# Patient Record
Sex: Female | Born: 1965 | Race: White | Hispanic: No | Smoking: Former smoker
Health system: Southern US, Community
[De-identification: ages and names within clinical notes are randomized; demographics above are authoritative.]

## PROBLEM LIST (undated history)

## (undated) DIAGNOSIS — R519 Headache, unspecified: Secondary | ICD-10-CM

## (undated) DIAGNOSIS — F419 Anxiety disorder, unspecified: Secondary | ICD-10-CM

## (undated) DIAGNOSIS — R51 Headache: Secondary | ICD-10-CM

## (undated) DIAGNOSIS — E119 Type 2 diabetes mellitus without complications: Secondary | ICD-10-CM

## (undated) DIAGNOSIS — D649 Anemia, unspecified: Secondary | ICD-10-CM

## (undated) DIAGNOSIS — G4730 Sleep apnea, unspecified: Secondary | ICD-10-CM

## (undated) DIAGNOSIS — E669 Obesity, unspecified: Secondary | ICD-10-CM

## (undated) DIAGNOSIS — N184 Chronic kidney disease, stage 4 (severe): Secondary | ICD-10-CM

## (undated) DIAGNOSIS — M7071 Other bursitis of hip, right hip: Secondary | ICD-10-CM

## (undated) DIAGNOSIS — M7072 Other bursitis of hip, left hip: Secondary | ICD-10-CM

## (undated) DIAGNOSIS — I10 Essential (primary) hypertension: Secondary | ICD-10-CM

## (undated) DIAGNOSIS — K219 Gastro-esophageal reflux disease without esophagitis: Secondary | ICD-10-CM

## (undated) DIAGNOSIS — J449 Chronic obstructive pulmonary disease, unspecified: Secondary | ICD-10-CM

## (undated) DIAGNOSIS — T7840XA Allergy, unspecified, initial encounter: Secondary | ICD-10-CM

## (undated) DIAGNOSIS — F32A Depression, unspecified: Secondary | ICD-10-CM

## (undated) DIAGNOSIS — F329 Major depressive disorder, single episode, unspecified: Secondary | ICD-10-CM

## (undated) HISTORY — DX: Essential (primary) hypertension: I10

## (undated) HISTORY — DX: Anxiety disorder, unspecified: F41.9

## (undated) HISTORY — DX: Other bursitis of hip, left hip: M70.72

## (undated) HISTORY — DX: Major depressive disorder, single episode, unspecified: F32.9

## (undated) HISTORY — DX: Allergy, unspecified, initial encounter: T78.40XA

## (undated) HISTORY — PX: HYSTEROSCOPY: SHX211

## (undated) HISTORY — DX: Obesity, unspecified: E66.9

## (undated) HISTORY — DX: Type 2 diabetes mellitus without complications: E11.9

## (undated) HISTORY — DX: Headache: R51

## (undated) HISTORY — PX: CHOLECYSTECTOMY: SHX55

## (undated) HISTORY — DX: Other bursitis of hip, left hip: M70.71

## (undated) HISTORY — DX: Chronic kidney disease, stage 4 (severe): N18.4

## (undated) HISTORY — DX: Headache, unspecified: R51.9

## (undated) HISTORY — DX: Depression, unspecified: F32.A

## (undated) HISTORY — DX: Sleep apnea, unspecified: G47.30

---

## 1996-03-11 HISTORY — PX: HYSTERECTOMY ABDOMINAL WITH SALPINGECTOMY: SHX6725

## 2004-04-26 ENCOUNTER — Ambulatory Visit: Payer: Self-pay | Admitting: Psychiatry

## 2004-11-05 ENCOUNTER — Ambulatory Visit: Payer: Self-pay | Admitting: Family Medicine

## 2005-01-01 ENCOUNTER — Emergency Department: Payer: Self-pay | Admitting: Emergency Medicine

## 2005-01-10 ENCOUNTER — Ambulatory Visit: Payer: Self-pay | Admitting: Specialist

## 2005-05-08 ENCOUNTER — Ambulatory Visit: Payer: Self-pay | Admitting: Pain Medicine

## 2005-05-14 ENCOUNTER — Ambulatory Visit: Payer: Self-pay | Admitting: Pain Medicine

## 2005-05-15 ENCOUNTER — Ambulatory Visit: Payer: Self-pay | Admitting: Pain Medicine

## 2005-05-16 ENCOUNTER — Ambulatory Visit: Payer: Self-pay | Admitting: Pain Medicine

## 2005-05-23 ENCOUNTER — Other Ambulatory Visit: Payer: Self-pay

## 2005-05-23 ENCOUNTER — Emergency Department: Payer: Self-pay | Admitting: Emergency Medicine

## 2005-06-24 ENCOUNTER — Ambulatory Visit: Payer: Self-pay | Admitting: Pain Medicine

## 2007-02-19 DIAGNOSIS — F411 Generalized anxiety disorder: Secondary | ICD-10-CM | POA: Insufficient documentation

## 2007-11-19 ENCOUNTER — Ambulatory Visit: Payer: Self-pay | Admitting: Family Medicine

## 2007-11-20 DIAGNOSIS — B009 Herpesviral infection, unspecified: Secondary | ICD-10-CM | POA: Insufficient documentation

## 2008-01-11 ENCOUNTER — Ambulatory Visit: Payer: Self-pay

## 2008-02-02 ENCOUNTER — Ambulatory Visit: Payer: Self-pay

## 2008-04-13 ENCOUNTER — Ambulatory Visit: Payer: Self-pay

## 2008-04-21 ENCOUNTER — Ambulatory Visit: Payer: Self-pay

## 2008-08-18 DIAGNOSIS — R350 Frequency of micturition: Secondary | ICD-10-CM | POA: Insufficient documentation

## 2008-10-20 ENCOUNTER — Ambulatory Visit: Payer: Self-pay | Admitting: Gastroenterology

## 2008-11-01 ENCOUNTER — Ambulatory Visit: Payer: Self-pay | Admitting: Gastroenterology

## 2008-11-24 ENCOUNTER — Ambulatory Visit: Payer: Self-pay | Admitting: Surgery

## 2008-11-25 ENCOUNTER — Ambulatory Visit: Payer: Self-pay | Admitting: Surgery

## 2009-02-26 ENCOUNTER — Emergency Department: Payer: Self-pay | Admitting: Emergency Medicine

## 2009-05-31 ENCOUNTER — Ambulatory Visit: Payer: Self-pay | Admitting: Gastroenterology

## 2009-06-15 ENCOUNTER — Ambulatory Visit: Payer: Self-pay | Admitting: Gastroenterology

## 2009-07-27 DIAGNOSIS — Z79899 Other long term (current) drug therapy: Secondary | ICD-10-CM | POA: Insufficient documentation

## 2009-09-03 ENCOUNTER — Emergency Department: Payer: Self-pay | Admitting: Emergency Medicine

## 2009-10-02 DIAGNOSIS — L309 Dermatitis, unspecified: Secondary | ICD-10-CM | POA: Insufficient documentation

## 2009-11-19 ENCOUNTER — Emergency Department: Payer: Self-pay | Admitting: Emergency Medicine

## 2009-11-24 ENCOUNTER — Emergency Department: Payer: Self-pay | Admitting: Emergency Medicine

## 2009-12-14 DIAGNOSIS — E538 Deficiency of other specified B group vitamins: Secondary | ICD-10-CM | POA: Insufficient documentation

## 2009-12-22 ENCOUNTER — Emergency Department: Payer: Self-pay | Admitting: Internal Medicine

## 2010-01-05 ENCOUNTER — Ambulatory Visit: Payer: Self-pay | Admitting: Family Medicine

## 2010-02-06 ENCOUNTER — Ambulatory Visit: Payer: Self-pay | Admitting: Family Medicine

## 2011-05-11 LAB — TSH: Thyroid Stimulating Horm: 0.894 u[IU]/mL

## 2011-05-11 LAB — URINALYSIS, COMPLETE
Bacteria: NONE SEEN
Bilirubin,UR: NEGATIVE
Blood: NEGATIVE
Glucose,UR: NEGATIVE mg/dL (ref 0–75)
Leukocyte Esterase: NEGATIVE
Ph: 7 (ref 4.5–8.0)
RBC,UR: 34 /HPF (ref 0–5)
Specific Gravity: 1.024 (ref 1.003–1.030)
Squamous Epithelial: <1
WBC UR: 2 /HPF (ref 0–5)

## 2011-05-11 LAB — ETHANOL
Ethanol %: <0.003 % (ref 0.000–0.080)
Ethanol: <3 mg/dL

## 2011-05-11 LAB — DRUG SCREEN, URINE
Amphetamines, Ur Screen: NEGATIVE (ref ?–1000)
Barbiturates, Ur Screen: NEGATIVE (ref ?–200)
Cannabinoid 50 Ng, Ur ~~LOC~~: POSITIVE (ref ?–50)
Cocaine Metabolite,Ur ~~LOC~~: POSITIVE (ref ?–300)
MDMA (Ecstasy)Ur Screen: NEGATIVE (ref ?–500)
Opiate, Ur Screen: POSITIVE (ref ?–300)

## 2011-05-11 LAB — SALICYLATE LEVEL: Salicylates, Serum: 5.3 mg/dL — ABNORMAL HIGH

## 2011-05-11 LAB — COMPREHENSIVE METABOLIC PANEL
Alkaline Phosphatase: 77 U/L (ref 50–136)
BUN: 22 mg/dL — ABNORMAL HIGH (ref 7–18)
Creatinine: 1.49 mg/dL — ABNORMAL HIGH (ref 0.60–1.30)
EGFR (African American): 49 — ABNORMAL LOW
EGFR (Non-African Amer.): 40 — ABNORMAL LOW
Glucose: 92 mg/dL (ref 65–99)
SGPT (ALT): 32 U/L
Sodium: 144 mmol/L (ref 136–145)
Total Protein: 7.9 g/dL (ref 6.4–8.2)

## 2011-05-11 LAB — PREGNANCY, URINE: Pregnancy Test, Urine: NEGATIVE m[IU]/mL

## 2011-05-11 LAB — CBC
HCT: 45.6 % (ref 35.0–47.0)
MCH: 34.6 pg — ABNORMAL HIGH (ref 26.0–34.0)
MCHC: 34 g/dL (ref 32.0–36.0)
Platelet: 253 10*3/uL (ref 150–440)
RBC: 4.48 10*6/uL (ref 3.80–5.20)
WBC: 12.1 10*3/uL — ABNORMAL HIGH (ref 3.6–11.0)

## 2011-05-11 LAB — ACETAMINOPHEN LEVEL: Acetaminophen: <2 ug/mL

## 2011-05-12 ENCOUNTER — Inpatient Hospital Stay: Payer: Self-pay | Admitting: Psychiatry

## 2011-06-19 DIAGNOSIS — K219 Gastro-esophageal reflux disease without esophagitis: Secondary | ICD-10-CM | POA: Insufficient documentation

## 2011-06-19 DIAGNOSIS — R141 Gas pain: Secondary | ICD-10-CM | POA: Insufficient documentation

## 2011-08-26 ENCOUNTER — Inpatient Hospital Stay: Payer: Self-pay | Admitting: Psychiatry

## 2011-08-27 LAB — BEHAVIORAL MEDICINE 1 PANEL
Albumin: 3.1 g/dL — ABNORMAL LOW (ref 3.4–5.0)
Alkaline Phosphatase: 89 U/L (ref 50–136)
Bilirubin,Total: 0.4 mg/dL (ref 0.2–1.0)
Calcium, Total: 8.7 mg/dL (ref 8.5–10.1)
Co2: 26 mmol/L (ref 21–32)
Creatinine: 0.85 mg/dL (ref 0.60–1.30)
EGFR (African American): >60
EGFR (Non-African Amer.): >60
Eosinophil #: 0.5 10*3/uL (ref 0.0–0.7)
Eosinophil %: 5.8 %
Lymphocyte #: 2.7 10*3/uL (ref 1.0–3.6)
Lymphocyte %: 29.1 %
MCH: 33.1 pg (ref 26.0–34.0)
MCHC: 32.8 g/dL (ref 32.0–36.0)
Monocyte %: 5.9 %
Neutrophil #: 5.4 10*3/uL (ref 1.4–6.5)
Osmolality: 288 (ref 275–301)
Platelet: 224 10*3/uL (ref 150–440)
RBC: 4.26 10*6/uL (ref 3.80–5.20)
RDW: 14.2 % (ref 11.5–14.5)
SGOT(AST): 12 U/L — ABNORMAL LOW (ref 15–37)
SGPT (ALT): 27 U/L
Sodium: 144 mmol/L (ref 136–145)
Thyroid Stimulating Horm: 1.2 u[IU]/mL
Total Protein: 5.9 g/dL — ABNORMAL LOW (ref 6.4–8.2)
WBC: 9.2 10*3/uL (ref 3.6–11.0)

## 2011-08-27 LAB — FOLATE: Folic Acid: 8.9 ng/mL (ref 3.1–100.0)

## 2011-08-28 LAB — URINALYSIS, COMPLETE
Bacteria: NONE SEEN
Blood: NEGATIVE
Ketone: NEGATIVE
Leukocyte Esterase: NEGATIVE
Nitrite: NEGATIVE
Ph: 8 (ref 4.5–8.0)
Protein: NEGATIVE
RBC,UR: NONE SEEN /HPF (ref 0–5)
Specific Gravity: 1.015 (ref 1.003–1.030)
Squamous Epithelial: 1
WBC UR: NONE SEEN /HPF (ref 0–5)

## 2012-03-23 DIAGNOSIS — J309 Allergic rhinitis, unspecified: Secondary | ICD-10-CM | POA: Insufficient documentation

## 2012-04-20 ENCOUNTER — Inpatient Hospital Stay: Payer: Self-pay | Admitting: Psychiatry

## 2012-04-20 LAB — URINALYSIS, COMPLETE
Bacteria: NONE SEEN
Bilirubin,UR: NEGATIVE
Blood: NEGATIVE
Ketone: NEGATIVE
Leukocyte Esterase: NEGATIVE
Ph: 6 (ref 4.5–8.0)
RBC,UR: 2 /HPF (ref 0–5)
Squamous Epithelial: <1
WBC UR: <1 /HPF (ref 0–5)

## 2012-04-20 LAB — ETHANOL
Ethanol %: <0.003 % (ref 0.000–0.080)
Ethanol: <3 mg/dL

## 2012-04-20 LAB — COMPREHENSIVE METABOLIC PANEL
Alkaline Phosphatase: 123 U/L (ref 50–136)
Calcium, Total: 9.2 mg/dL (ref 8.5–10.1)
Co2: 24 mmol/L (ref 21–32)
Creatinine: 0.65 mg/dL (ref 0.60–1.30)
EGFR (Non-African Amer.): >60
Glucose: 97 mg/dL (ref 65–99)
Potassium: 4.2 mmol/L (ref 3.5–5.1)
SGOT(AST): 26 U/L (ref 15–37)
SGPT (ALT): 39 U/L (ref 12–78)
Sodium: 137 mmol/L (ref 136–145)

## 2012-04-20 LAB — CBC
MCH: 32.6 pg (ref 26.0–34.0)
MCHC: 33.5 g/dL (ref 32.0–36.0)
Platelet: 331 10*3/uL (ref 150–440)
WBC: 13.9 10*3/uL — ABNORMAL HIGH (ref 3.6–11.0)

## 2012-04-20 LAB — DRUG SCREEN, URINE
Amphetamines, Ur Screen: NEGATIVE (ref ?–1000)
Barbiturates, Ur Screen: NEGATIVE (ref ?–200)
Cannabinoid 50 Ng, Ur ~~LOC~~: POSITIVE (ref ?–50)
Cocaine Metabolite,Ur ~~LOC~~: NEGATIVE (ref ?–300)
MDMA (Ecstasy)Ur Screen: NEGATIVE (ref ?–500)
Methadone, Ur Screen: NEGATIVE (ref ?–300)

## 2012-04-20 LAB — TSH: Thyroid Stimulating Horm: 0.936 u[IU]/mL

## 2012-06-10 DIAGNOSIS — N393 Stress incontinence (female) (male): Secondary | ICD-10-CM | POA: Insufficient documentation

## 2012-07-03 ENCOUNTER — Ambulatory Visit: Payer: Self-pay | Admitting: Specialist

## 2012-08-22 DIAGNOSIS — N3946 Mixed incontinence: Secondary | ICD-10-CM | POA: Insufficient documentation

## 2012-08-22 DIAGNOSIS — N952 Postmenopausal atrophic vaginitis: Secondary | ICD-10-CM | POA: Insufficient documentation

## 2012-08-22 DIAGNOSIS — IMO0002 Reserved for concepts with insufficient information to code with codable children: Secondary | ICD-10-CM | POA: Insufficient documentation

## 2012-11-16 DIAGNOSIS — N951 Menopausal and female climacteric states: Secondary | ICD-10-CM | POA: Insufficient documentation

## 2013-05-21 DIAGNOSIS — K644 Residual hemorrhoidal skin tags: Secondary | ICD-10-CM | POA: Insufficient documentation

## 2013-12-06 DIAGNOSIS — R140 Abdominal distension (gaseous): Secondary | ICD-10-CM | POA: Insufficient documentation

## 2013-12-07 ENCOUNTER — Ambulatory Visit: Payer: Self-pay | Admitting: Gastroenterology

## 2014-01-06 ENCOUNTER — Ambulatory Visit: Payer: Self-pay | Admitting: Gastroenterology
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
## 2014-07-03 NOTE — Discharge Summary (Signed)
PATIENT NAME:  Darlene Barber, Darlene Barber MR#:  [id number] DATE OF BIRTH:  [birth year]  DATE OF ADMISSION:  08/26/2011 DATE OF DISCHARGE:  08/29/2011  HISTORY OF PRESENT ILLNESS:  Darlene Barber is a 49 year old female who was admitted to the inpatient behavioral health unit after she had developed a new type of  hallucination experience.  These visual and auditory hallucinations were very disturbing to the patient and she had come to the point that she was not sure if she could contract for safety outside the hospital. She was overwhelmed with worry about the nature of them.   Outpatient attempts to make them go away involved a tapering off of Topamax, which was not successful. The hallucinations have involved an emergence of vague auditory sounds that are non-distinct and this date visual hallucinations involving the form of a man. She has not had any delusions. Her orientation and memory function have been intact. She has not experienced any thought disorganization, clouding of consciousness, or agitation. She has not had a fever.   She had been experiencing some chronic gastroesophageal reflux problems. She has been seen by gastroenterology at [facility] and they are going to do further examination on an outpatient basis.   ANCILLARY CLINICAL DATA: The head CT was negative. EEG was negative.  123456, RPR, folic acid, and TSH were all negative. The rest of her laboratory data was unremarkable. The patient and was greatly reassured by a nonfocal exam and the tests being negative.   HOSPITAL COURSE: Please see the above. The patient's distressed resolved. She engaged appropriately in the milieu with appropriate social behavior. She was involved in group and milieu psychotherapy.   She was continued on her outpatient pharmacotherapeutic regimen except the Latuda was increased to 80 mg daily and her cetirizine was discontinued as a possible of her new hallucinations.   CONDITION ON DISCHARGE: By 08/29/2011 Darlene Barber has normal social behavior. She is not having any hallucinations at the time discharge. She states that she does have her baseline intermittent hallucinations from time to time. The undersigned and the patient discussed how the recent hallucinations may have been secondary to cetirizine.    Regardless, the patient has continued to be confident that she can tolerate slight residual hallucinations because they are not annoying and are part of her outpatient stable baseline.   She expresses her normal interests including normal interest in listening to music. She is not having any thoughts of harming herself or others. She is not having any delusions. She is not having any adverse medication effects.   MENTAL STATUS EXAM UPON DISCHARGE: Darlene Barber is alert. She is oriented to all spheres. Her concentration is normal. Eye contact is normal. Memory is intact to immediate, recent, and remote. Fund of knowledge, intelligence, and use of language are normal. Speech involves normal rate and prosody without dysarthria. Thought process is logical, coherent, and goal directed. No looseness of associations. Thought content: No thoughts of harming herself. No thoughts of harming others. No delusions. No hallucinations at the time of the interview. Insight intact. Affect broad and appropriate. Mood within normal limits. Judgment intact.   ASSESSMENT:  AXIS I:  1. Schizoaffective disorder, stable, with occasional tolerable hallucinations consistent with her stable outpatient baseline.  2. Psychotic disorder, not otherwise specified: This category was utilized to designate the possibility of an organic etiology for the new pattern of her hallucinations described above. The possibility that cetirizine has been involved as a cause cannot yet be ruled out;  therefore, she will remain off of cetirizine.  Please see the discussion below regarding chlor amphetamine regarding a chlorpheniramine. 3. Cannabis abuse.  Although this clearly can be involved in the organic etiology of new psychotic manifestations, the patient has not changed her pattern in the abuse of cannabis or using any lacing cannabis.   AXIS II: Deferred.   AXIS III: Gastroesophageal reflux disease. Please see the above.   AXIS IV: General medical.   AXIS V: 55.  Darlene Barber is not at risk to harm herself or others. She agrees to call emergency services immediately for any thoughts of harming herself, thoughts of harming others, or distress.   Darlene Barber agrees to not drive if drowsy.   DIET: Regular.   ACTIVITY: Routine.   For decongestion, instead of cetirizine she will utilize chlorpheniramine p.o. q. 6 hours p.r.n. over-the-counter.  DISCHARGE MEDICATIONS in addition to chlorpheniramine: 1. Latuda 80 mg q.a.m.  2. Ambien CR 12.5 mg at bedtime p.r.n.  3. Seroquel 400 mg at bedtime.  4. Ranitidine 10 mg b.i.d.  5. Inderal LA 160 mg daily.  6. Promethazine 12.5 mg b.i.d. p.r.n.  7. Pantoprazole 40 mg daily.  8. Remeron 30 mg at bedtime. 9. Lamictal 150 mg b.i.d.  10. Neurontin 400 mg t.i.d. 11. Flexeril 10 mg t.i.d. p.r.n.  12. Famciclovir as needed.  13. Colace 100 mg b.i.d. 14. Diclofenac 75 mg b.i.d.  The patient stated that she had a supply of all of her medication at home except the following: Ranitidine 150 mg- she was given #20 and one refill. Phenergan- she was given #20 and one refill.  Remeron- she was given #10 with one refill.  Latuda- she was given #10 with one refill. Flexeril- she was given #10 with one refill. Ambien CR- she was given #10 with one refill.    FOLLOW-UP APPOINTMENT:  1. She will follow up with her general medical physician within 10 days of discharge.  2. She is also going to reschedule for gastrointestinal, upper GI work-up with [facility] for her gastroesophageal reflux disease.   3. Her psychiatric followup is with [facility] on 07/01 at  1:00.    ____________________________ Drue Stager. Mclane Arora, MD [id number] D: 08/29/2011 19:09:17 ET T: 08/30/2011 07:01:23 ET JOB#: [id number]  cc: Drue Stager. Izear Pine, MD, <Dictator> Billie Ruddy MD ELECTRONICALLY SIGNED 09/02/2011 23:55
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
## 2014-10-03 ENCOUNTER — Other Ambulatory Visit: Payer: Self-pay | Admitting: Family Medicine

## 2014-10-03 DIAGNOSIS — Z0000 Encounter for general adult medical examination without abnormal findings: Secondary | ICD-10-CM

## 2014-11-15 DIAGNOSIS — R200 Anesthesia of skin: Secondary | ICD-10-CM | POA: Insufficient documentation

## 2014-11-15 DIAGNOSIS — E669 Obesity, unspecified: Secondary | ICD-10-CM | POA: Insufficient documentation

## 2014-11-15 DIAGNOSIS — G4733 Obstructive sleep apnea (adult) (pediatric): Secondary | ICD-10-CM | POA: Insufficient documentation

## 2014-11-15 DIAGNOSIS — R202 Paresthesia of skin: Secondary | ICD-10-CM

## 2014-11-15 DIAGNOSIS — Z6841 Body Mass Index (BMI) 40.0 and over, adult: Secondary | ICD-10-CM

## 2015-07-04 ENCOUNTER — Ambulatory Visit: Payer: Medicaid Other

## 2015-07-06 DIAGNOSIS — R682 Dry mouth, unspecified: Secondary | ICD-10-CM | POA: Insufficient documentation

## 2015-08-03 ENCOUNTER — Ambulatory Visit: Payer: Medicaid Other | Attending: Otolaryngology

## 2015-08-03 DIAGNOSIS — F5101 Primary insomnia: Secondary | ICD-10-CM | POA: Insufficient documentation

## 2015-08-03 DIAGNOSIS — G4733 Obstructive sleep apnea (adult) (pediatric): Secondary | ICD-10-CM | POA: Diagnosis not present

## 2015-09-07 ENCOUNTER — Ambulatory Visit: Payer: Medicaid Other | Attending: Neurology

## 2015-09-07 DIAGNOSIS — G4733 Obstructive sleep apnea (adult) (pediatric): Secondary | ICD-10-CM | POA: Diagnosis present

## 2016-01-22 ENCOUNTER — Other Ambulatory Visit: Payer: Self-pay | Admitting: Neurology

## 2016-01-22 DIAGNOSIS — M79604 Pain in right leg: Secondary | ICD-10-CM

## 2016-01-22 DIAGNOSIS — M7989 Other specified soft tissue disorders: Secondary | ICD-10-CM

## 2016-01-26 ENCOUNTER — Ambulatory Visit
Admission: RE | Admit: 2016-01-26 | Discharge: 2016-01-26 | Disposition: A | Discharge status: Home / Self Care | Payer: Medicaid Other | Source: Ambulatory Visit | Attending: Neurology | Admitting: Neurology

## 2016-01-26 DIAGNOSIS — M7989 Other specified soft tissue disorders: Secondary | ICD-10-CM

## 2016-01-26 DIAGNOSIS — M79604 Pain in right leg: Secondary | ICD-10-CM | POA: Diagnosis present

## 2016-01-30 DIAGNOSIS — M7989 Other specified soft tissue disorders: Secondary | ICD-10-CM | POA: Insufficient documentation

## 2016-01-30 DIAGNOSIS — M7061 Trochanteric bursitis, right hip: Secondary | ICD-10-CM | POA: Insufficient documentation

## 2016-04-12 DIAGNOSIS — M7061 Trochanteric bursitis, right hip: Secondary | ICD-10-CM | POA: Diagnosis not present

## 2016-04-12 DIAGNOSIS — M5416 Radiculopathy, lumbar region: Secondary | ICD-10-CM | POA: Diagnosis not present

## 2016-08-20 ENCOUNTER — Ambulatory Visit (INDEPENDENT_AMBULATORY_CARE_PROVIDER_SITE_OTHER): Payer: Self-pay | Admitting: Family Medicine

## 2016-08-20 ENCOUNTER — Encounter: Payer: Self-pay | Admitting: Family Medicine

## 2016-08-20 VITALS — BP 160/77 | HR 62 | Temp 98.5°F | Resp 16 | Ht 59.0 in | Wt 210.0 lb

## 2016-08-20 DIAGNOSIS — N951 Menopausal and female climacteric states: Secondary | ICD-10-CM | POA: Diagnosis not present

## 2016-08-20 DIAGNOSIS — R143 Flatulence: Secondary | ICD-10-CM

## 2016-08-20 DIAGNOSIS — Z87891 Personal history of nicotine dependence: Secondary | ICD-10-CM

## 2016-08-20 DIAGNOSIS — G4700 Insomnia, unspecified: Secondary | ICD-10-CM | POA: Insufficient documentation

## 2016-08-20 DIAGNOSIS — M150 Primary generalized (osteo)arthritis: Secondary | ICD-10-CM | POA: Diagnosis not present

## 2016-08-20 DIAGNOSIS — R202 Paresthesia of skin: Secondary | ICD-10-CM | POA: Diagnosis not present

## 2016-08-20 DIAGNOSIS — Z7689 Persons encountering health services in other specified circumstances: Secondary | ICD-10-CM | POA: Diagnosis not present

## 2016-08-20 DIAGNOSIS — R141 Gas pain: Secondary | ICD-10-CM

## 2016-08-20 DIAGNOSIS — I10 Essential (primary) hypertension: Secondary | ICD-10-CM

## 2016-08-20 DIAGNOSIS — R142 Eructation: Secondary | ICD-10-CM

## 2016-08-20 DIAGNOSIS — J410 Simple chronic bronchitis: Secondary | ICD-10-CM | POA: Diagnosis not present

## 2016-08-20 DIAGNOSIS — J42 Unspecified chronic bronchitis: Secondary | ICD-10-CM | POA: Insufficient documentation

## 2016-08-20 DIAGNOSIS — G4701 Insomnia due to medical condition: Secondary | ICD-10-CM

## 2016-08-20 DIAGNOSIS — F319 Bipolar disorder, unspecified: Secondary | ICD-10-CM | POA: Diagnosis not present

## 2016-08-20 DIAGNOSIS — Z720 Tobacco use: Secondary | ICD-10-CM | POA: Insufficient documentation

## 2016-08-20 DIAGNOSIS — Z6841 Body Mass Index (BMI) 40.0 and over, adult: Secondary | ICD-10-CM

## 2016-08-20 DIAGNOSIS — M159 Polyosteoarthritis, unspecified: Secondary | ICD-10-CM

## 2016-08-20 MED ORDER — ALBUTEROL SULFATE HFA 108 (90 BASE) MCG/ACT IN AERS: 2.0000 | INHALATION_SPRAY | RESPIRATORY_TRACT | 3 refills | Status: DC | PRN

## 2016-08-20 MED ORDER — ETODOLAC 500 MG PO TABS: 500.0000 mg | ORAL_TABLET | Freq: Two times a day (BID) | ORAL | 5 refills | Status: DC

## 2016-08-20 MED ORDER — BACLOFEN 10 MG PO TABS: 5.0000 mg | ORAL_TABLET | Freq: Three times a day (TID) | ORAL | 2 refills | Status: DC | PRN
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
## 2016-08-20 NOTE — Patient Instructions (Addendum)
Thank you for coming to the clinic today.  1.  Please contact [facility] ([facility]) for follow-up of same problems before with abdominal pain and bloating flares - they are under the impression that your symptoms have resolved, but I think this needs to be revisited further.  You last saw Darlene Barber (01/2014)  [place], [place] [address] Hours: 8AM-5PM Phone: [phone]  2. Referral to GYN for further discussion on Post-menopausal Hot flashes and hormone treatment in the future  D[facility]ene [facility]   Address: [address] [place], [place], [place], [place] [address] Hours: 8AM-5PM Phone: [phone] ------------------------ Refilled the [place]) anti-inflammatory 500mg  twice daily with food - resume taking this everyday for now - Don't mix with the ibuprofen, advil, naproxen, meloxicam.  Recommend to start taking Tylenol Extra Strength 500mg  tabs - take 1 to 2 tabs per dose (max 1000mg ) every 6-8 hours for pain (take regularly, don't skip a dose for next 7 days), max 24 hour daily dose is 6 tablets or 3000mg . In the future you can repeat the same everyday Tylenol course for 1-2 weeks at a time.   Start taking Baclofen (Lioresal) 10mg  (muscle relaxant) - start with half (cut) to one whole pill at night as needed for next 1-3 nights (may make you drowsy, caution with driving) see how it affects you, then if tolerated increase to one pill 2 to 3 times a day or (every 8 hours as needed)  ---------- Please discuss options with [facility] for potential injections in neck or arm- if needed.  [facility] ([facility]) to find out follow-up plan, see if Darlene [facility] can see you or what the wait time will be, and if they can renew your prescriptions such as Lyrica (that [facility] was prescribing) ----------------------------------------------------------------------------------------  Please  discuss your concern with Anxiety concerns with [facility] to discuss other options, also consider other medicines that may help hot flashes and also mood as well, such as Venlafaxine (Effexor), Paxil low dose nightly  --------------------------------- You will be due for FASTING BLOOD WORK (no food or drink after midnight before, only water or coffee without cream/sugar on the morning of)  - Please go ahead and schedule a "Lab Only" visit in the morning at the clinic for lab draw in 3 months  - Make sure Lab Only appointment is at least 1-2 weeks before your next appointment, so that results will be available  Please schedule a Follow-up Appointment to: Return in about 3 months (around 11/20/2016) for Annual Physical.  If you have any other questions or concerns, please feel free to call the clinic or send a message through [place]. You may also schedule an earlier appointment if necessary.  [name], DO [place]

## 2016-08-21 ENCOUNTER — Other Ambulatory Visit: Payer: Self-pay | Admitting: Family Medicine

## 2016-08-21 ENCOUNTER — Encounter: Payer: Self-pay | Admitting: Family Medicine

## 2016-08-21 DIAGNOSIS — R799 Abnormal finding of blood chemistry, unspecified: Secondary | ICD-10-CM

## 2016-08-21 DIAGNOSIS — Z0000 Encounter for general adult medical examination without abnormal findings: Secondary | ICD-10-CM

## 2016-08-21 DIAGNOSIS — E785 Hyperlipidemia, unspecified: Secondary | ICD-10-CM

## 2016-08-21 DIAGNOSIS — Z6841 Body Mass Index (BMI) 40.0 and over, adult: Secondary | ICD-10-CM

## 2016-08-21 DIAGNOSIS — R718 Other abnormality of red blood cells: Secondary | ICD-10-CM

## 2016-08-21 DIAGNOSIS — E118 Type 2 diabetes mellitus with unspecified complications: Secondary | ICD-10-CM

## 2016-08-21 DIAGNOSIS — E559 Vitamin D deficiency, unspecified: Secondary | ICD-10-CM

## 2016-08-21 DIAGNOSIS — N951 Menopausal and female climacteric states: Secondary | ICD-10-CM | POA: Insufficient documentation

## 2016-08-21 NOTE — Assessment & Plan Note (Signed)
Chronic problem, with chronic underlying bipolar depression and mixed anxiety Did not focus on reviewing full history on this problem, as I do not have her outside records from [facility] Dr Miles Costain - Continue to follow-up with [facility] - Complicated recent treatment since she self discontinued all meds 7 weeks ago, does not have full med list of what she was on before, and now asking for refills has only partial medications PHQ and GAD scores abnormal, no comparison. She exhibited passive SI only, already established with Psychiatry  Plan: 1. Advised patient that I do not manage some of the medications she is/has been on for Bipolar, and I would not be refilling them today, she has enough supply, and I encouraged her to follow-up with her Psychiatry to continue and adjust her mental health treatments. She was requesting anxiety med specifically, checked [place] CSRS for past 1 year. She needs to discuss with Psych.

## 2016-08-21 NOTE — Assessment & Plan Note (Signed)
Concern with postmenopausal vasomotor symptoms >3 years now amenorrhea, had been managed by prior PCP with HRT on estradiol dose 1-2mg  daily, she is requesting refill  Plan: 1. Discussion today on risks of HRT, and I do not routinely prescribe this, especially in setting of other concerns with new patient, without past records, no formal GYN evaluation, risk with obesity and other factors. Additional advised her that some symptoms of hot flashes may be improved with SNRI vs SSRI, Gabapentin she has not experienced improvement and has been on some of these meds. Re-consider options in future - may discuss with Psychiatry 2. No refill given 3. Referral to [facility] for further discussion and management of these symptoms, for more comprehensive evaluation and discussion of risks

## 2016-08-21 NOTE — Assessment & Plan Note (Signed)
Mildly elevated initial BP. Likely with acute L arm pain. - Home BP readings not available. Outside record not available. No known complications - awaiting outside lab results   Plan:  1. She is currently no longer on anti-HTN medication - previously on Enalapril 5mg  in past but now self discontinued, did not restart 2. Encourage improved lifestyle - low sodium diet, regular exercise 3. Start monitor BP outside office, bring readings to next visit, if persistently >140/90 or new symptoms notify office sooner 4. Follow-up within 3 months for Annual Physical, review prior records, labs, and consider resume ACEi or other therapy at that time if still elevated BP

## 2016-08-21 NOTE — Assessment & Plan Note (Addendum)
Worsening L shoulder problem associated with LUE paresthesias, limited ROM concern for possible frozen shoulder L side due to pain chronically. In setting of multiple joint osteoarthritis. - Followed by [facility] (formerly Dr Melrose Nakayama) and Dr Sharlet Salina [facility]), for some joint injections and management, has been on variety of NSAIDs in past including Ibuprofen, Etolodac, and then changed to Meloxicam due to GI intolerance but patient was unaware of this med change, she requested Etolodac - Also prior on Flexeril, requesting refill but interested to change med - Prior workup has included Lumbar MRI, EMG of the lower extremities by Dr. Melrose Nakayama on 01/22/16 (normal study)  Plan: 1. Discontinue old Flexeril rx. Order new Baclofen 10mg  take 5-10mg  TID PRN pain / spasms, may help nerve related symptoms as well, less sedating, reviewed potential side effects 2. Refilled Etolodac 500mg  BID as previously prescribed - now on chart review seems she was switched to Meloxicam, will change if patient prefers, she was unaware of this 3. Continue current other management with Lyrica 150mg  BID - not refill today, she should follow-up with Neurology to continue this 4. Advised to follow-up with Dr Sharlet Salina for further management joint pain, consider upper back / neck injections as possibility if symptoms of L arm related to radiculopathy, likely will need upper extremity nerve conduction study per Neurology 5. Advised she would likely need Physical Therapy for L shoulder to improve ROM in near future, this was offered by Dr Sharlet Salina but she was not interested

## 2016-08-21 NOTE — Assessment & Plan Note (Signed)
Stable without exacerbation Former smoker, chronic bronchitis form of COPD without formal PFTs available, awaiting outside PCP records Refilled Albuterol rescue inhaler, helps PRN, infrequent use Consider daily maintenance vs formal spirometry vs PFTs in future

## 2016-08-21 NOTE — Assessment & Plan Note (Signed)
Secondary to bipolar/depression/anxiety, and chronic pain

## 2016-08-21 NOTE — Assessment & Plan Note (Signed)
Chronic problem with intermittent flares >3+ years now, had been followed by prior PCP, [facility], and most recently [facility]. Constellation of diagnoses given possibly IBS, patient was unclear on dx and thought recurrent bacterial infections, requiring doxycycline PRN - Currently stable without flare or abdominal pain or change in bowel habits. No obvious GI red flags  Plan: 1. Advised that she should probably follow-up with her previous GI since she is not improved and still has same chronic problem. She was requesting PRN Doxycycline to continue what had worked before, but was not questioning why she would have to keep taking this. I advised her that I do not routinely phone in antibiotics PRN for this type of problem. I would like more clarity on what I am treating. Reviewed last note [facility] (she was unfamiliar with last visit and did not recall exactly what the plan was), seen by Dr Arther Dames in 01/2014 and was improved, at that time on probiotic. I told her it seemed that she now has had recurrences since then and may warrant further discussion with GI before we continue prescribing doxycycline chronically. She will contact them to schedule follow-up.

## 2016-08-21 NOTE — Assessment & Plan Note (Signed)
Abnormal BMI >42, weight gain, worse with sedentary, chronic pain limited exercise, poor dietary habits - Follow-up in 3 months for labs A1c, will work on lifestyle modifications as tolerated

## 2016-08-21 NOTE — Assessment & Plan Note (Signed)
Persistent chronic problem, multiple joints back and hips, sciatica and trochanteric bursitis, also now with worsening L shoulder problem associated with LUE paresthesias. - Followed by Dr Sharlet Salina [facility]), for some joint injections and management, has been on variety of NSAIDs in past including Ibuprofen, Etolodac, and then changed to Meloxicam due to GI intolerance but patient was unaware of this med change, she requested Etolodac - Also prior on Flexeril, requesting refill but interested to change med - Prior workup has included Lumbar MRI, EMG of the lower extremities by Dr. Melrose Nakayama on 01/22/16 (normal study)  Plan: 1. Discontinue old Flexeril rx. Order new Baclofen 10mg  take 5-10mg  TID PRN pain / spasms, may help nerve related symptoms as well, less sedating, reviewed potential side effects 2. Refilled Etolodac 500mg  BID as previously prescribed - now on chart review seems she was switched to Meloxicam, will change if patient prefers, she was unaware of this 3. Continue current other management with Lyrica 150mg  BID - not refill today, she should follow-up with Neurology to continue this 4. Advised to follow-up with Dr Sharlet Salina for further management joint pain, consider upper back / neck injections as possibility if symptoms of L arm related to radiculopathy, likely will need upper extremity nerve conduction study per Neurology

## 2016-09-02 ENCOUNTER — Telehealth: Payer: Self-pay

## 2016-09-02 NOTE — Telephone Encounter (Signed)
Patient called stating that the etodolac needs a PA approval in order for her to get meds.  If not she would like a different anitinflamatory call in.  Please advise

## 2016-09-05 NOTE — Telephone Encounter (Signed)
PA was done and medication was approved. TD#[id number]. The pt was notified.

## 2016-09-10 ENCOUNTER — Encounter: Payer: Self-pay | Admitting: Obstetrics & Gynecology

## 2016-09-10 ENCOUNTER — Ambulatory Visit (INDEPENDENT_AMBULATORY_CARE_PROVIDER_SITE_OTHER): Payer: Medicaid Other | Admitting: Obstetrics & Gynecology

## 2016-09-10 ENCOUNTER — Telehealth: Payer: Self-pay | Admitting: Obstetrics & Gynecology

## 2016-09-10 VITALS — BP 110/80 | HR 71 | Ht 59.0 in | Wt 206.0 lb

## 2016-09-10 DIAGNOSIS — Z1231 Encounter for screening mammogram for malignant neoplasm of breast: Secondary | ICD-10-CM

## 2016-09-10 DIAGNOSIS — R232 Flushing: Secondary | ICD-10-CM

## 2016-09-10 DIAGNOSIS — Z1239 Encounter for other screening for malignant neoplasm of breast: Secondary | ICD-10-CM

## 2016-09-10 MED ORDER — CLONIDINE HCL 0.1 MG PO TABS: 0.1000 mg | ORAL_TABLET | Freq: Every day | ORAL | 11 refills | Status: DC

## 2016-09-10 MED ORDER — ESTRADIOL 1 MG PO TABS: 1.0000 mg | ORAL_TABLET | Freq: Every day | ORAL | 11 refills | Status: DC

## 2016-09-10 NOTE — Patient Instructions (Signed)
Clonidine oral tablets What is this medicine? CHLORTHALIDONE; CLONIDINE (klor THAL i done ; KLOE ni deen) is a combination of two medicines that are used to treat high blood pressure. Chlorthalidone is a diuretic. It lowers blood pressure and increases the amount of urine passed, which causes the body to lose salt and water. Clonidine also lowers blood pressure. This medicine may be used for other purposes; ask your health care provider or pharmacist if you have questions. COMMON BRAND NAME(S): Clorpres, Combipres What should I tell my health care provider before I take this medicine? They need to know if you have any of these conditions: -asthma -diabetes -gout -kidney disease -liver disease -systemic lupus erythematosus (SLE) -an unusual or allergic reaction to chlorthalidone, sulfa drugs, clonidine, other medicines, foods, dyes, or preservatives -pregnant or trying to get pregnant -breast-feeding How should I use this medicine? Take this medicine by mouth with a glass of water. Take this medicine with food. Follow the directions on the prescription label. Take your doses at regular intervals. Do not take your medicine more often than directed. Remember that you will need to pass urine frequently after taking this medicine. Do not suddenly stop taking this medicine. You must gradually reduce the dose or you may get a dangerous increase in blood pressure. Ask your doctor or health care professional for advice. Talk to your pediatrician regarding the use of this medicine in children. Special care may be needed. Overdosage: If you think you have taken too much of this medicine contact a poison control center or emergency room at once. NOTE: This medicine is only for you. Do not share this medicine with others. What if I miss a dose? If you miss a dose, take it as soon as you can. If it is almost time for your next dose, take only that dose. Do not take double or extra doses. What may interact  with this medicine? -barbiturate medicines for inducing sleep or treating seizures like phenobarbital -certain medicines for depression, like amitriptyline or imipramine -digoxin -ephedra, Ma Huang -glycyrrhizin (licorice) -lithium -medicines for diabetes -other medicines for high blood pressure -steroid medicines like prednisone or cortisone This list may not describe all possible interactions. Give your health care provider a list of all the medicines, herbs, non-prescription drugs, or dietary supplements you use. Also tell them if you smoke, drink alcohol, or use illegal drugs. Some items may interact with your medicine. What should I watch for while using this medicine? Visit your doctor or health care professional for regular checks on your progress. Check your blood pressure regularly as directed. Ask your doctor or health care professional what your blood pressure should be and when you should contact him or her. You may need blood work done while you are taking this medicine. You may need to be on a special diet while taking this medicine. Ask your doctor. You may get drowsy or dizzy. Do not drive, use machinery, or do anything that needs mental alertness until you know how this medicine affects you. To avoid dizzy or fainting spells, do not stand or sit up quickly, especially if you are an older person. Alcohol can make you more drowsy and dizzy. Avoid alcoholic drinks. This medicine may affect blood sugar levels. If you have diabetes, check with your doctor or health care professional before you change your diet or the dose of your diabetic medicine. Your mouth may get dry. Chewing sugarless gum or sucking hard candy, and drinking plenty of water may help. Contact  your doctor if the problem does not go away or is severe. This medicine can make you more sensitive to the sun. Keep out of the sun. If you cannot avoid being in the sun, wear protective clothing and use sunscreen. Do not use sun  lamps or tanning beds/booths. Do not treat yourself for coughs, colds, or pain while you are taking this medicine without asking your doctor or health care professional for advice. Some ingredients may increase your blood pressure. If you are going to have surgery tell your doctor or health care professional that you are taking this medicine. What side effects may I notice from receiving this medicine? Side effects that you should report to your doctor or health care professional as soon as possible: -allergic reactions like skin rash, itching or hives, swelling of the face, lips, or tongue -anxiety, nervousness -chest pain -depressed mood -fast, irregular heartbeat -feeling faint or lightheaded, falls -increased hunger or thirst -muscle pain, cramps, or spasm -pain or difficulty when passing urine -pain, tingling, numbness in the hands or feet -redness, blistering, peeling or loosening of the skin, including inside the mouth -swelling of feet or legs -unusually weak or tired -yellowing of the eyes or skin Side effects that usually do not require medical attention (report to your doctor or health care professional if they continue or are bothersome): -change in sex drive or performance -drowsiness -dry mouth -headache -nausea -stomach upset This list may not describe all possible side effects. Call your doctor for medical advice about side effects. You may report side effects to FDA at 1-800-FDA-1088. Where should I keep my medicine? Keep out of the reach of children. Store between 20 and 25 degrees C (68 and 77 degrees F). Protect from moisture. Throw away any unused medicine after the expiration date. NOTE: This sheet is a summary. It may not cover all possible information. If you have questions about this medicine, talk to your doctor, pharmacist, or health care provider.  2018 Elsevier/Gold Standard (2015-03-30 11:18:05)

## 2016-09-10 NOTE — Telephone Encounter (Signed)
[facility] calling about patient prescription Estrodill 1mg  once a day. Prescription amount. There is a question about dosage. Patient has a previous prescription for Estrodil  1 mg by two a day. Please advise pharmacy CB# [phone]

## 2016-09-10 NOTE — Progress Notes (Signed)
HPI:      [name] is a 51 y.o. [phone] who is perimenopausal, presents today for a problem visit.  She complains of hot flashes, sweats.   Symptoms have been present for a few years. Symptoms are mod to severe and has led her to come in today to seek options for intervention.  Previous Treatment: ERT- helps most of time, often has to take 2mg  a day  She is not sexually active. Denies PostMenopausal Bleeding. Reports hysterectomy 20 years ago.  PMHx: She  has a past medical history of Allergy; Anxiety; Depression; Diabetes mellitus without complication ([place]); Frequent headaches; Glaucoma; Hypertension; and Sleep apnea. Also,  has a past surgical history that includes Cholecystectomy; Cesarean section; Hysteroscopy; and Hysterectomy abdominal with salpingectomy (1998)., family history includes Alzheimer's disease in her maternal grandfather; Cancer in her other; Diabetes in her maternal aunt and maternal uncle; Emphysema in her paternal grandmother; Heart failure in her maternal grandmother; Hodgkin's lymphoma in her mother.,  reports that she quit smoking about 8 months ago. She has a 30.00 pack-year smoking history. She has quit using smokeless tobacco. She reports that she drinks alcohol. She reports that she uses drugs, including Marijuana.  She has a current medication list which includes the following prescription(s): albuterol, aspirin ec, b complex vitamins, baclofen, biotin, calcium-vitamin d, cetirizine, clobetasol cream, clonidine, divalproex, estradiol, etodolac, fluticasone, loratadine, lurasidone, lysine, melatonin, omeprazole, polyethylene glycol, potassium chloride, pregabalin, promethazine, propranolol hcl, and vitamin c. Also, is allergic to piper and tape.  Review of Systems  Constitutional: Negative for chills, fever and malaise/fatigue.  HENT: Negative for congestion, sinus pain and sore throat.   Eyes: Negative for blurred vision and pain.  Respiratory: Negative for  cough and wheezing.   Cardiovascular: Negative for chest pain and leg swelling.  Gastrointestinal: Negative for abdominal pain, constipation, diarrhea, heartburn, nausea and vomiting.  Genitourinary: Negative for dysuria, frequency, hematuria and urgency.  Musculoskeletal: Negative for back pain, joint pain, myalgias and neck pain.  Skin: Negative for itching and rash.  Neurological: Negative for dizziness, tremors and weakness.  Endo/Heme/Allergies: Does not bruise/bleed easily.  Psychiatric/Behavioral: Negative for depression. The patient is not nervous/anxious and does not have insomnia.     Objective: BP 110/80   Pulse 71   Ht 4\' 11"  (1.499 m)   Wt 206 lb (93.4 kg)   BMI 41.61 kg/m  Physical Exam  Constitutional: She is oriented to person, place, and time. She appears well-developed and well-nourished. No distress.  Musculoskeletal: Normal range of motion.  Neurological: She is alert and oriented to person, place, and time.  Skin: Skin is warm and dry.  Psychiatric: She has a normal mood and affect.  Vitals reviewed.   ASSESSMENT/PLAN:  Menopause. 1. Hot flashes - HRT I have discussed HRT with the patient in detail.  The risk/benefits of it were reviewed.  She understands that during menopause Estrogen decreases dramatically and that this results in an increased risk of cardiovascular disease as well as osteoporosis.  We have also discussed the fact that hot flashes often result from a decrease in Estrogen, and that by replacing Estrogen, they can often be alleviated.  We have discussed skin, vaginal and urinary tract changes that may also take place from this drop in Estrogen.  Emotional changes have also been linked to Estrogen and we have briefly discussed this.  The benefits of HRT including decrease in hot flashes, vaginal dryness, and osteoporosis were discussed.  The emotional benefit and a possible change  in her cardiovascular risk profile was also reviewed.  The risks  associated with Hormone Replacement Therapy were also reviewed.  The use of unopposed Estrogen and its relationship to endometrial cancer was discussed.  The addition of Progesterone and its beneficial effect on endometrial cancer was also noted.  The fact that there has been no consistent definitive studies showing an increase in breast cancer in women who use HRT was discussed with the patient.  The possible side effects including breast tenderness, fluid retention, mood changes and vaginal bleeding were discussed.  The patient was informed that this is an elective medication and that she may choose not to take Hormone Replacement Therapy.  Literature on HRT was given, and I believe that after answering all of the patient's questions, she has an adequate and informed understanding of HRT.  Special emphasis on the WHI study, as well as several studies since that pertaining to the risks and benefits of estrogen replacement therapy were compared.  The possible limitations of these studies were discussed including the age stratification of the WHI study.  The possible role of Progesterone in these studies was discussed in detail.  I believe that the patient has an informed knowledge of the risks and benefits of HRT.  I have specifically discussed WHI findings and current updates.  Different type of hormone formulation and methods of taking hormone replacement therapy discussed.   Plan Estradiol 1 mg daily only. Will also supplement with Clonidine 0.1 mg daily as well. May be able to transisiton off of ERT in future. F/u 2 mos.  2. Screening for breast cancer - MM DIGITAL SCREENING BILATERAL; Future  3. Prior hysterectomy. Plan PAP nv and every 5 years.  Barnett Applebaum, MD, [facility], [facility] 09/10/2016  11:26 AM

## 2016-09-12 NOTE — Telephone Encounter (Signed)
Pharmacy aware

## 2016-09-12 NOTE — Telephone Encounter (Signed)
Estradiol 1 mg once daily is all I want her to take.  (Rx- # 30, refills 11).  Please update w pharmacy.

## 2016-10-03 ENCOUNTER — Other Ambulatory Visit: Payer: Self-pay | Admitting: Family Medicine

## 2016-10-07 ENCOUNTER — Encounter: Payer: Self-pay | Admitting: Radiology

## 2016-10-07 ENCOUNTER — Ambulatory Visit
Admission: RE | Admit: 2016-10-07 | Discharge: 2016-10-07 | Disposition: A | Discharge status: Home / Self Care | Payer: Medicaid Other | Source: Ambulatory Visit | Attending: Obstetrics & Gynecology | Admitting: Obstetrics & Gynecology

## 2016-10-07 DIAGNOSIS — Z1231 Encounter for screening mammogram for malignant neoplasm of breast: Secondary | ICD-10-CM | POA: Insufficient documentation

## 2016-10-07 DIAGNOSIS — Z1239 Encounter for other screening for malignant neoplasm of breast: Secondary | ICD-10-CM

## 2016-10-09 ENCOUNTER — Other Ambulatory Visit: Payer: Self-pay | Admitting: Neurology

## 2016-10-09 DIAGNOSIS — R200 Anesthesia of skin: Secondary | ICD-10-CM

## 2016-10-14 ENCOUNTER — Encounter: Payer: Self-pay | Admitting: Obstetrics & Gynecology

## 2016-10-21 ENCOUNTER — Other Ambulatory Visit: Payer: Self-pay | Admitting: Family Medicine

## 2016-10-23 ENCOUNTER — Ambulatory Visit: Admission: RE | Admit: 2016-10-23 | Payer: Medicaid Other | Source: Ambulatory Visit

## 2016-11-12 ENCOUNTER — Ambulatory Visit: Payer: Medicaid Other | Admitting: Obstetrics & Gynecology

## 2016-11-12 ENCOUNTER — Other Ambulatory Visit: Payer: Medicaid Other

## 2016-11-12 DIAGNOSIS — E785 Hyperlipidemia, unspecified: Secondary | ICD-10-CM

## 2016-11-12 DIAGNOSIS — Z0000 Encounter for general adult medical examination without abnormal findings: Secondary | ICD-10-CM

## 2016-11-12 DIAGNOSIS — R799 Abnormal finding of blood chemistry, unspecified: Secondary | ICD-10-CM

## 2016-11-12 DIAGNOSIS — E559 Vitamin D deficiency, unspecified: Secondary | ICD-10-CM

## 2016-11-12 DIAGNOSIS — E118 Type 2 diabetes mellitus with unspecified complications: Secondary | ICD-10-CM

## 2016-11-12 DIAGNOSIS — Z6841 Body Mass Index (BMI) 40.0 and over, adult: Secondary | ICD-10-CM

## 2016-11-12 DIAGNOSIS — R718 Other abnormality of red blood cells: Secondary | ICD-10-CM

## 2016-11-13 LAB — COMPLETE METABOLIC PANEL WITH GFR
ALK PHOS: 73 U/L (ref 33–130)
ALT: 13 U/L (ref 6–29)
AST: 15 U/L (ref 10–35)
Albumin: 3.7 g/dL (ref 3.6–5.1)
BILIRUBIN TOTAL: 0.4 mg/dL (ref 0.2–1.2)
BUN: 14 mg/dL (ref 7–25)
CALCIUM: 9.2 mg/dL (ref 8.6–10.4)
CHLORIDE: 104 mmol/L (ref 98–110)
CO2: 23 mmol/L (ref 20–32)
CREATININE: 0.71 mg/dL (ref 0.50–1.05)
GFR, Est African American: >89 mL/min (ref >=60)
GFR, Est Non African American: >89 mL/min (ref >=60)
Glucose, Bld: 139 mg/dL — ABNORMAL HIGH (ref 65–99)
Potassium: 4.7 mmol/L (ref 3.5–5.3)
Sodium: 139 mmol/L (ref 135–146)
Total Protein: 6.5 g/dL (ref 6.1–8.1)

## 2016-11-13 LAB — LIPID PANEL
CHOLESTEROL: 182 mg/dL (ref <200)
HDL: 47 mg/dL — ABNORMAL LOW (ref >50)
LDL Cholesterol: 90 mg/dL (ref <100)
Total CHOL/HDL Ratio: 3.9 Ratio (ref <5.0)
Triglycerides: 223 mg/dL — ABNORMAL HIGH (ref <150)
VLDL: 45 mg/dL — AB (ref <30)

## 2016-11-13 LAB — CBC WITH DIFFERENTIAL/PLATELET
Basophils Absolute: 90 cells/uL (ref 0–200)
Basophils Relative: 1 %
EOS PCT: 4 %
Eosinophils Absolute: 360 cells/uL (ref 15–500)
HCT: 45.6 % — ABNORMAL HIGH (ref 35.0–45.0)
HEMOGLOBIN: 14.9 g/dL (ref 11.7–15.5)
LYMPHS ABS: 3240 {cells}/uL (ref 850–3900)
Lymphocytes Relative: 36 %
MCH: 32.8 pg (ref 27.0–33.0)
MCHC: 32.7 g/dL (ref 32.0–36.0)
MCV: 100.4 fL — ABNORMAL HIGH (ref 80.0–100.0)
MPV: 11.1 fL (ref 7.5–12.5)
Monocytes Absolute: 360 cells/uL (ref 200–950)
Monocytes Relative: 4 %
NEUTROS PCT: 55 %
Neutro Abs: 4950 cells/uL (ref 1500–7800)
Platelets: 310 10*3/uL (ref 140–400)
RBC: 4.54 MIL/uL (ref 3.80–5.10)
RDW: 14.1 % (ref 11.0–15.0)
WBC: 9 10*3/uL (ref 3.8–10.8)

## 2016-11-13 LAB — TSH: TSH: 1.26 m[IU]/L

## 2016-11-13 LAB — HEMOGLOBIN A1C
Hgb A1c MFr Bld: 6.4 % — ABNORMAL HIGH (ref <5.7)
MEAN PLASMA GLUCOSE: 137 mg/dL

## 2016-11-13 LAB — VITAMIN D 25 HYDROXY (VIT D DEFICIENCY, FRACTURES): VIT D 25 HYDROXY: 32 ng/mL (ref 30–100)

## 2016-11-18 ENCOUNTER — Ambulatory Visit: Payer: Medicaid Other | Admitting: Obstetrics & Gynecology

## 2016-11-20 ENCOUNTER — Encounter: Payer: Self-pay | Admitting: Family Medicine

## 2016-11-28 ENCOUNTER — Other Ambulatory Visit: Payer: Self-pay | Admitting: Family Medicine

## 2016-11-28 DIAGNOSIS — R202 Paresthesia of skin: Secondary | ICD-10-CM

## 2016-11-28 DIAGNOSIS — M159 Polyosteoarthritis, unspecified: Secondary | ICD-10-CM

## 2016-11-28 DIAGNOSIS — M150 Primary generalized (osteo)arthritis: Secondary | ICD-10-CM

## 2016-11-29 ENCOUNTER — Other Ambulatory Visit: Payer: Self-pay | Admitting: Family Medicine

## 2016-11-29 ENCOUNTER — Ambulatory Visit: Payer: Medicaid Other | Admitting: Obstetrics & Gynecology

## 2016-11-29 DIAGNOSIS — I10 Essential (primary) hypertension: Secondary | ICD-10-CM

## 2016-12-16 DIAGNOSIS — G251 Drug-induced tremor: Secondary | ICD-10-CM | POA: Insufficient documentation

## 2016-12-18 ENCOUNTER — Other Ambulatory Visit: Payer: Self-pay | Admitting: Neurology

## 2016-12-18 DIAGNOSIS — R200 Anesthesia of skin: Secondary | ICD-10-CM

## 2016-12-18 DIAGNOSIS — R202 Paresthesia of skin: Secondary | ICD-10-CM

## 2016-12-23 ENCOUNTER — Other Ambulatory Visit: Payer: Self-pay | Admitting: Family Medicine

## 2016-12-23 DIAGNOSIS — J410 Simple chronic bronchitis: Secondary | ICD-10-CM

## 2016-12-25 ENCOUNTER — Ambulatory Visit
Admission: RE | Admit: 2016-12-25 | Discharge: 2016-12-25 | Disposition: A | Discharge status: Home / Self Care | Payer: Medicaid Other | Source: Ambulatory Visit | Attending: Neurology | Admitting: Neurology

## 2016-12-30 ENCOUNTER — Ambulatory Visit
Admission: RE | Admit: 2016-12-30 | Discharge: 2016-12-30 | Disposition: A | Discharge status: Home / Self Care | Payer: Medicaid Other | Source: Ambulatory Visit | Attending: Neurology | Admitting: Neurology

## 2016-12-30 DIAGNOSIS — M47812 Spondylosis without myelopathy or radiculopathy, cervical region: Secondary | ICD-10-CM | POA: Diagnosis not present

## 2016-12-30 DIAGNOSIS — G251 Drug-induced tremor: Secondary | ICD-10-CM | POA: Diagnosis present

## 2016-12-30 DIAGNOSIS — M4802 Spinal stenosis, cervical region: Secondary | ICD-10-CM | POA: Insufficient documentation

## 2016-12-30 DIAGNOSIS — R200 Anesthesia of skin: Secondary | ICD-10-CM | POA: Diagnosis not present

## 2016-12-30 DIAGNOSIS — R202 Paresthesia of skin: Secondary | ICD-10-CM | POA: Diagnosis present

## 2017-01-01 ENCOUNTER — Other Ambulatory Visit: Payer: Self-pay | Admitting: Family Medicine

## 2017-01-01 DIAGNOSIS — K219 Gastro-esophageal reflux disease without esophagitis: Secondary | ICD-10-CM

## 2017-01-27 ENCOUNTER — Other Ambulatory Visit: Payer: Self-pay | Admitting: Family Medicine

## 2017-01-28 ENCOUNTER — Other Ambulatory Visit: Payer: Self-pay | Admitting: Nurse Practitioner

## 2017-02-19 ENCOUNTER — Ambulatory Visit: Payer: Medicaid Other | Admitting: Family Medicine

## 2017-02-24 ENCOUNTER — Other Ambulatory Visit: Payer: Self-pay | Admitting: Nurse Practitioner

## 2017-02-25 ENCOUNTER — Encounter: Payer: Self-pay | Admitting: Family Medicine

## 2017-02-25 ENCOUNTER — Ambulatory Visit: Payer: Medicaid Other | Admitting: Family Medicine

## 2017-02-25 VITALS — BP 115/81 | HR 75 | Temp 98.2°F | Resp 16 | Ht 59.0 in | Wt 221.0 lb

## 2017-02-25 DIAGNOSIS — G4701 Insomnia due to medical condition: Secondary | ICD-10-CM | POA: Diagnosis not present

## 2017-02-25 DIAGNOSIS — N3946 Mixed incontinence: Secondary | ICD-10-CM

## 2017-02-25 DIAGNOSIS — F3175 Bipolar disorder, in partial remission, most recent episode depressed: Secondary | ICD-10-CM | POA: Diagnosis not present

## 2017-02-25 DIAGNOSIS — I10 Essential (primary) hypertension: Secondary | ICD-10-CM | POA: Diagnosis not present

## 2017-02-25 MED ORDER — MIRABEGRON ER 25 MG PO TB24: 25.0000 mg | ORAL_TABLET | Freq: Every day | ORAL | 2 refills | Status: DC

## 2017-02-25 MED ORDER — PROPRANOLOL HCL ER 120 MG PO CP24: 120.0000 mg | ORAL_CAPSULE | Freq: Every day | ORAL | 5 refills | Status: DC

## 2017-02-25 MED ORDER — NORTRIPTYLINE HCL 25 MG PO CAPS: 25.0000 mg | ORAL_CAPSULE | Freq: Every day | ORAL | 5 refills | Status: DC

## 2017-02-25 NOTE — Progress Notes (Addendum)
Subjective:    Patient ID: Darlene Barber, female    DOB: October 27, 1965, 51 y.o.   MRN: [id number]  Darlene Barber is a 51 y.o. 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 - Followed by [facility] with Dr Melrose Nakayama and Dr Sharlet Salina, recently seen for shoulder injection, had prior MRI C-spine done recently - She was rx Prednisone burst today has not started yet due to flare of pain - Admits neck pain, improving now - Denies new injury, trauma, redness swelling, worse numbness or tingling  Bipolar Depression / Anxiety Followed by Dr Jacqualine Code [facility], started her on Amitriptyline 72m for anxiety and also helped with tremors, however now concern about weight gain on medicine - asking about alternatives, next visit with Dr MJacqualine Codeis not until few months, has not notified his office - Denies suicidal or homicidal ideation, insomnia  CHRONIC HTN: Reports concern about dose of propanolol since extended 24 hour tab and taking twice daily Current Meds - Propranolol ER 24 hour - 1260mBID   Reports good compliance, took meds today. Tolerating well, w/o complaints. Denies CP, dyspnea, HA, edema, dizziness / lightheadedness  Urinary Incontinence History of hysterectomy 24 years ago, has bladder incontinence with occasional leakage in addition to stress incontinence, concern hormonal cause and gradually worse with age. Wears pad for incontinence. Not taking any med, not tried timed voiding - Denies any dysuria, hematuria, dysuria  Health Maintenance: Due for Flu Shot, declines today despite counseling on benefits   [facility]  Decreased Interest 2  Down, Depressed, Hopeless 1  PHQ - 2 Score 3  Altered sleeping 2  Tired, decreased energy 2  Change in  appetite 2  Feeling bad or failure about yourself  1  Trouble concentrating 2  Moving slowly or fidgety/restless 3  Suicidal thoughts 2  PHQ-9 Score 17   GAD 7 : Generalized Anxiety Score 08/20/2016  Nervous, Anxious, on Edge 3  Control/stop worrying 3  Worry too much - different things 3  Trouble relaxing 3  Restless 3  Easily annoyed or irritable 3  Afraid - awful might happen 3  Total GAD 7 Score 21  Anxiety Difficulty Very difficult      Social History   Tobacco Use  . Smoking status: Former Smoker    Packs/day: 1.00    Years: 30.00    Pack years: 30.00    Last attempt to quit: 01/2016    Years since quitting: 1.1  . Smokeless tobacco: Former [employer] Topics  . Alcohol use: Yes    Comment: occ  . Drug use: Yes    Types: Marijuana    Comment: Current use    Review of Systems Per HPI unless specifically indicated above     Objective:    BP 115/81   Pulse 75   Temp 98.2 F (36.8 C) (Oral)   Resp 16   Ht _0  (1.499 m)   Wt 221 lb (100.2 kg)   BMI 44.64 kg/m   Wt Readings from Last 3 Encounters:  02/25/17 221 lb (100.2 kg)  09/10/16 206 lb (93.4 kg)  08/20/16 210 lb (95.3 kg)    Physical Exam  Constitutional: She is oriented to person, place, and time. She appears well-developed and well-nourished. No distress.  Well-appearing, comfortable, cooperative  HENT:  Head: Normocephalic and atraumatic.  Mouth/Throat: Oropharynx is clear and moist.  Eyes: Conjunctivae are normal. Right eye exhibits no discharge. Left eye exhibits no discharge.  Neck: Normal range of motion. Neck supple.  Cardiovascular: Normal rate.  Pulmonary/Chest: Effort normal.  Abdominal: Soft. Bowel sounds are normal. She exhibits no distension. There is no tenderness.  Musculoskeletal: She exhibits no edema.  Neurological: She is alert and oriented to person, place, and time.  Skin: Skin is warm and dry. No rash noted. She is not diaphoretic. No erythema.    Psychiatric: She has a normal mood and affect. Her behavior is normal.  Well groomed, good eye contact, normal speech and thoughts  Nursing note and vitals reviewed.  Results for orders placed or performed in visit on 11/12/16  COMPLETE METABOLIC PANEL WITH GFR  Result Value Ref Range   Sodium 139 135 - 146 mmol/L   Potassium 4.7 3.5 - 5.3 mmol/L   Chloride 104 98 - 110 mmol/L   CO2 23 20 - 32 mmol/L   Glucose, Bld 139 (H) 65 - 99 mg/dL   BUN 14 7 - 25 mg/dL   Creat 0.71 0.50 - 1.05 mg/dL   Total Bilirubin 0.4 0.2 - 1.2 mg/dL   Alkaline Phosphatase 73 33 - 130 U/L   AST 15 10 - 35 U/L   ALT 13 6 - 29 U/L   Total Protein 6.5 6.1 - 8.1 g/dL   Albumin 3.7 3.6 - 5.1 g/dL   Calcium 9.2 8.6 - 10.4 mg/dL   GFR, Est African American >89 >=60 mL/min   GFR, Est Non African American >89 >=60 mL/min  Lipid panel  Result Value Ref Range   Cholesterol 182 <200 mg/dL   Triglycerides 223 (H) <150 mg/dL   HDL 47 (L) >50 mg/dL   Total CHOL/HDL Ratio 3.9 <5.0 Ratio   VLDL 45 (H) <30 mg/dL   LDL Cholesterol 90 <100 mg/dL  Hemoglobin A1c  Result Value Ref Range   Hgb A1c MFr Bld 6.4 (H) <5.7 %   Mean Plasma Glucose 137 mg/dL  CBC with Differential/Platelet  Result Value Ref Range   WBC 9.0 3.8 - 10.8 K/uL   RBC 4.54 3.80 - 5.10 MIL/uL   Hemoglobin 14.9 11.7 - 15.5 g/dL   HCT 45.6 (H) 35.0 - 45.0 %   MCV 100.4 (H) 80.0 - 100.0 fL   MCH 32.8 27.0 - 33.0 pg   MCHC 32.7 32.0 - 36.0 g/dL   RDW 14.1 11.0 - 15.0 %   Platelets 310 140 - 400 K/uL   MPV 11.1 7.5 - 12.5 fL   Neutro Abs 4,950 1,500 - 7,800 cells/uL   Lymphs Abs 3,240 850 - 3,900 cells/uL   Monocytes Absolute 360 200 - 950 cells/uL   Eosinophils Absolute 360 15 - 500 cells/uL   Basophils Absolute 90 0 - 200 cells/uL   Neutrophils Relative % 55 %   Lymphocytes Relative 36 %   Monocytes Relative 4 %   Eosinophils Relative 4 %   Basophils Relative 1 %   Smear Review Criteria for review not met   TSH  Result Value Ref Range    TSH 1.26 mIU/L  VITAMIN D 25 Hydroxy (Vit-D Deficiency, Fractures)  Result Value Ref Range   Vit D, 25-Hydroxy 32 30 - 100 ng/mL      Assessment & Plan:   Problem List Items Addressed This Visit    Bipolar disorder ([place])    Stable  without recent change Switch Amitriptyline to Nortriptyline due to side effect wt gain reported, discussed that I prefer her Psychiatry to make med changes, but since unable to follow-up with them agreed to more unilateral switch within TCA class for better tolerated option. If not working needs to notify [place] for sooner follow-up      Relevant Medications   nortriptyline (PAMELOR) 25 MG capsule   Hypertension - Primary    Improved BP Home BP readings not available No known complication Self discontinued Enalapril in past  Plan 1. Reduce dose Propranolol from 16m BID to daily since 24 hour tab 2. Monitor BP outside office 3. IMprove lifestyle, wt loss 4. Follow-up 3 months      Relevant Medications   propranolol ER (INDERAL LA) 120 MG 24 hr capsule   Insomnia    Trial on Nortriptyline to avoid side effects wt gain on Amitriptyline      Relevant Medications   nortriptyline (PAMELOR) 25 MG capsule   Mixed stress and urge urinary incontinence    Concern multifactorial etiology of incontinence, likely urge and likely stress as well No prior Urology or urodynamic testing No other meds tried  Plan: 1. Start empiric trial on Myrbetriq 25mER 2. Counseling on timed voiding strategy to help better regulate bladder emptying to limit some symptoms 3. Referral to [facility] for further eval and urodynamics, management      Relevant Medications   mirabegron ER (MYRBETRIQ) 25 MG TB24 tablet      Meds ordered this encounter  Medications  . nortriptyline (PAMELOR) 25 MG capsule    Sig: Take 1 capsule (25 mg total) by mouth at bedtime. If after 2-3 weeks not improved can take 2 pills for dose 5066mnce daily    Dispense:  30 capsule     Refill:  5  . propranolol ER (INDERAL LA) 120 MG 24 hr capsule    Sig: Take 1 capsule (120 mg total) by mouth daily.    Dispense:  30 capsule    Refill:  5  . mirabegron ER (MYRBETRIQ) 25 MG TB24 tablet    Sig: Take 1 tablet (25 mg total) by mouth daily.    Dispense:  30 tablet    Refill:  2   Orders Placed This Encounter  Procedures  . Ambulatory referral to Urology    Referral Priority:   Routine    Referral Type:   Consultation    Referral Reason:   Specialty Services Required    Requested Specialty:   Urology    Number of Visits Requested:   1     Follow up plan: Return in about 3 months (around 05/26/2017) for Urology f/u, Anxiety f/u med adjust.  AleNobie PutnamO [place] 02/26/2017, 12:24 AM

## 2017-02-25 NOTE — Patient Instructions (Addendum)
Thank you for coming to the clinic today.  1. Due to side effect weight gain on Amitriptyline - REDUCE dose down from 50 to 25mg  (CUT IN HALF) for 3 days then START NEW pill Nortriptyline 25mg  capsule ONCE daily - after 2-3 weeks if not improved anxiety/tremor symptoms, then may INCREASE dose to 2 capsules at once for 50mg  - call office for new rx stronger pills  2. Refilled Propanolol at LOWER DOSE - only ONCE daily now instead of twice.  3. Recommend to follow-up with Dr Sharlet Salina regarding Prednisone rx given today 12/18 and can follow-up with Dr Sharman Crate in few months regarding switch to Nortriptyline  Start Myrbetriq for urinary incontinence  Referral sent to [facility] stay tuned for an appointment  [facility] [address] [place],  [place]  [address] Phone: [phone]  Please schedule a Follow-up Appointment to: Return in about 3 months (around 05/26/2017) for Urology f/u, Anxiety f/u med adjust.    If you have any other questions or concerns, please feel free to call the clinic or send a message through [place]. You may also schedule an earlier appointment if necessary.  Additionally, you may be receiving a survey about your experience at our clinic within a few days to 1 week by e-mail or mail. We value your feedback.  [name], DO [place]

## 2017-02-26 ENCOUNTER — Encounter: Payer: Self-pay | Admitting: Family Medicine

## 2017-02-26 NOTE — Assessment & Plan Note (Signed)
Improved BP Home BP readings not available No known complication Self discontinued Enalapril in past  Plan 1. Reduce dose Propranolol from 120mg  BID to daily since 24 hour tab 2. Monitor BP outside office 3. IMprove lifestyle, wt loss 4. Follow-up 3 months

## 2017-02-26 NOTE — Assessment & Plan Note (Signed)
Trial on Nortriptyline to avoid side effects wt gain on Amitriptyline

## 2017-02-26 NOTE — Assessment & Plan Note (Signed)
Concern multifactorial etiology of incontinence, likely urge and likely stress as well No prior Urology or urodynamic testing No other meds tried  Plan: 1. Start empiric trial on Myrbetriq 25mg  ER 2. Counseling on timed voiding strategy to help better regulate bladder emptying to limit some symptoms 3. Referral to [facility] for further eval and urodynamics, management

## 2017-02-26 NOTE — Addendum Note (Signed)
Addended by: Olin Hauser on: 02/26/2017 12:49 AM   Modules accepted: Orders

## 2017-02-26 NOTE — Assessment & Plan Note (Signed)
Stable without recent change Switch Amitriptyline to Nortriptyline due to side effect wt gain reported, discussed that I prefer her Psychiatry to make med changes, but since unable to follow-up with them agreed to more unilateral switch within TCA class for better tolerated option. If not working needs to notify [facility] for sooner follow-up

## 2017-03-26 ENCOUNTER — Other Ambulatory Visit: Payer: Self-pay | Admitting: Family Medicine

## 2017-03-26 ENCOUNTER — Ambulatory Visit: Payer: Medicaid Other | Attending: Neurology

## 2017-03-26 DIAGNOSIS — J410 Simple chronic bronchitis: Secondary | ICD-10-CM

## 2017-03-26 DIAGNOSIS — E669 Obesity, unspecified: Secondary | ICD-10-CM | POA: Diagnosis present

## 2017-03-26 DIAGNOSIS — G4733 Obstructive sleep apnea (adult) (pediatric): Secondary | ICD-10-CM | POA: Diagnosis present

## 2017-03-31 ENCOUNTER — Ambulatory Visit: Payer: Medicaid Other | Admitting: Urology

## 2017-04-16 ENCOUNTER — Ambulatory Visit: Payer: Medicaid Other | Attending: Neurology

## 2017-04-16 DIAGNOSIS — R0683 Snoring: Secondary | ICD-10-CM | POA: Diagnosis not present

## 2017-04-16 DIAGNOSIS — G4733 Obstructive sleep apnea (adult) (pediatric): Secondary | ICD-10-CM | POA: Insufficient documentation

## 2017-04-21 ENCOUNTER — Ambulatory Visit (INDEPENDENT_AMBULATORY_CARE_PROVIDER_SITE_OTHER): Payer: Medicaid Other | Admitting: Urology

## 2017-04-21 VITALS — BP 121/85 | HR 84 | Ht 59.0 in | Wt 222.0 lb

## 2017-04-21 DIAGNOSIS — N3946 Mixed incontinence: Secondary | ICD-10-CM | POA: Diagnosis not present

## 2017-04-21 LAB — MICROSCOPIC EXAMINATION
RBC, UA: NONE SEEN /hpf (ref 0–?)
WBC, UA: NONE SEEN /hpf (ref 0–?)

## 2017-04-21 LAB — URINALYSIS, COMPLETE
Bilirubin, UA: POSITIVE — AB
LEUKOCYTES UA: NEGATIVE
Nitrite, UA: NEGATIVE
RBC, UA: NEGATIVE
Urobilinogen, Ur: 0.2 mg/dL (ref 0.2–1.0)
pH, UA: 5.5 (ref 5.0–7.5)

## 2017-04-21 NOTE — Progress Notes (Signed)
04/21/2017 9:17 AM   Darlene Barber 10-Sep-1965 [id number]  Referring provider: Olin Hauser, DO [address] [place], [place] [address]  No chief complaint on file.   HPI: Patient was consulted for worsening incontinence over 1 year especially since November.  She leaks with coughing sneezing standing and bending.  She has urge incontinence that is quite sudden.  I think she has small volume bedwetting.  She wears 3 pads a day that can be quite wet.  She voids every 2 or 3 hours and is no nocturia.  Her flow is good.  She may have been a partial responder to one medication a year ago.  She is not certain if she has had a hysterectomy.  She tends towards constipation  She denies a history of urinary tract infections previous GU surgery and kidney stones.  She takes medication orally for diabetes  Modifying factors: There are no other modifying factors  Associated signs and symptoms: There are no other associated signs and symptoms Aggravating and relieving factors: There are no other aggravating or relieving factors Severity: Moderate Duration: Persistent   PMH: Past Medical History:  Diagnosis Date  . Allergy   . Anxiety   . Depression   . Diabetes mellitus without complication ([place])   . Frequent headaches   . Glaucoma   . Hypertension   . Sleep apnea     Surgical History: Past Surgical History:  Procedure Laterality Date  . CESAREAN SECTION     x2  . CHOLECYSTECTOMY    . HYSTERECTOMY ABDOMINAL WITH SALPINGECTOMY  1998  . HYSTEROSCOPY      Home Medications:  Allergies as of 04/21/2017      Reactions   Piper Other (See Comments)   Feels like throat is closing, itchy Feels like throat is closing, itchy Feels like throat is closing, itchy   Tape Rash      Medication List        Accurate as of 04/21/17  9:17 AM. Always use your most recent med list.          benztropine 0.5 MG tablet Commonly known as:  COGENTIN Take 0.5 mg by mouth 2 (two)  times daily.   cetirizine 10 MG tablet Commonly known as:  ZYRTEC Take 10 mg by mouth.   clobetasol cream 0.05 % Commonly known as:  TEMOVATE Frequency:BID   Dosage:0.0     Instructions:  Note:Dose: 0.05 %   cloNIDine 0.1 MG tablet Commonly known as:  CATAPRES Take 1 tablet (0.1 mg total) by mouth daily.   divalproex 250 MG DR tablet Commonly known as:  DEPAKOTE Take 250 mg by mouth 3 (three) times daily.   estradiol 1 MG tablet Commonly known as:  ESTRACE Take 1 tablet (1 mg total) by mouth daily.   fluticasone 50 MCG/ACT nasal spray Commonly known as:  FLONASE USE 2 SPRAYS IN EACH NOSTRIL EVERYDAY AS NEEDED FOR SINUS CONGESTION   lurasidone 80 MG Tabs tablet Commonly known as:  LATUDA Take 80 mg by mouth.   Melatonin 5 MG Caps Take by mouth.   nortriptyline 25 MG capsule Commonly known as:  PAMELOR Take 25 mg by mouth at bedtime.   omeprazole 40 MG capsule Commonly known as:  PRILOSEC TAKE 1 CAPSULE BY MOUTH ONCE DAILY. TAKE 30 MINUTES PRIOR TO BREAKFAST   polyethylene glycol powder powder Commonly known as:  GLYCOLAX/MIRALAX TAKE 17GM (DISSOLVED IN WATER) BY MOUTH TWICE A DAY FOR CONSTIPATION   pregabalin 150 MG  capsule Commonly known as:  LYRICA Take by mouth.   promethazine 25 MG tablet Commonly known as:  PHENERGAN TAKE 1 TABLET BY MOUTH EVERY SIX HOURS AS NEEDED FOR NAUSEA VOMITING   propranolol ER 120 MG 24 hr capsule Commonly known as:  INDERAL LA Take 1 capsule (120 mg total) by mouth daily.   PROVENTIL HFA 108 (90 Base) MCG/ACT inhaler Generic drug:  albuterol INHALE 2 PUFFS INTO THE LUNGS EVERY 4 HOURS AS NEEDED FOR WHEEZING OR SHORTNESS OF BREATH   ranitidine 150 MG capsule Commonly known as:  ZANTAC Take 150 mg by mouth.   valACYclovir 1000 MG tablet Commonly known as:  VALTREX TAKE 1 TABLET BY MOUTH DAILY       Allergies:  Allergies  Allergen Reactions  . Piper Other (See Comments)    Feels like throat is closing, itchy Feels  like throat is closing, itchy Feels like throat is closing, itchy  . Tape Rash    Family History: Family History  Problem Relation Age of Onset  . Heart failure Maternal Grandmother   . Alzheimer's disease Maternal Grandfather   . Emphysema Paternal Grandmother   . Hodgkin's lymphoma Mother   . Diabetes Maternal Aunt   . Diabetes Maternal Uncle   . Cancer Other   . Breast cancer Neg Hx     Social History:  reports that she quit smoking about 15 months ago. She has a 30.00 pack-year smoking history. She has quit using smokeless tobacco. She reports that she drinks alcohol. She reports that she uses drugs. Drug: Marijuana.  ROS: UROLOGY Frequent Urination?: No Hard to postpone urination?: Yes Burning/pain with urination?: No Get up at night to urinate?: Yes Leakage of urine?: No Urine stream starts and stops?: No Trouble starting stream?: No Do you have to strain to urinate?: No Blood in urine?: No Urinary tract infection?: No Sexually transmitted disease?: Yes Injury to kidneys or bladder?: No Painful intercourse?: No Weak stream?: No Currently pregnant?: No Vaginal bleeding?: No Last menstrual period?: n  Gastrointestinal Nausea?: No Vomiting?: No Indigestion/heartburn?: Yes Diarrhea?: Yes Constipation?: No  Constitutional Fever: No Night sweats?: No Weight loss?: No Fatigue?: Yes  Skin Skin rash/lesions?: No Itching?: No  Eyes Blurred vision?: No Double vision?: No  Ears/Nose/Throat Sore throat?: No Sinus problems?: Yes  Hematologic/Lymphatic Swollen glands?: No Easy bruising?: No  Cardiovascular Leg swelling?: No Chest pain?: No  Respiratory Cough?: Yes Shortness of breath?: Yes  Endocrine Excessive thirst?: Yes  Musculoskeletal Back pain?: Yes Joint pain?: Yes  Neurological Headaches?: Yes Dizziness?: Yes  Psychologic Depression?: Yes Anxiety?: Yes  Physical Exam: BP 121/85   Pulse 84   Ht 4\' 11"  (1.499 m)   Wt 222 lb  (100.7 kg)   BMI 44.84 kg/m   Constitutional:  Alert and oriented, No acute distress. HEENT: [place] AT, moist mucus membranes.  Trachea midline, no masses. Cardiovascular: No clubbing, cyanosis, or edema. Respiratory: Normal respiratory effort, no increased work of breathing. GI: Abdomen is soft, nontender, nondistended, no abdominal masses GU: Mild grade 2 hypermobility of the bladder neck with a negative cough test with a mild cough.  Grade 1 cystocele and no rectocele Skin: No rashes, bruises or suspicious lesions. Lymph: No cervical or inguinal adenopathy. Neurologic: Grossly intact, no focal deficits, moving all 4 extremities. Psychiatric: Normal mood and affect.  Laboratory Data: Lab Results  Component Value Date   WBC 9.0 11/12/2016   HGB 14.9 11/12/2016   HCT 45.6 (H) 11/12/2016   MCV 100.4 (H)  11/12/2016   PLT 310 11/12/2016    Lab Results  Component Value Date   CREATININE 0.71 11/12/2016    No results found for: PSA  No results found for: TESTOSTERONE  Lab Results  Component Value Date   HGBA1C 6.4 (H) 11/12/2016    Urinalysis    Component Value Date/Time   COLORURINE Yellow 04/20/2012 1725   APPEARANCEUR Clear 04/20/2012 1725   LABSPEC 1.010 04/20/2012 1725   PHURINE 6.0 04/20/2012 1725   GLUCOSEU Negative 04/20/2012 1725   HGBUR Negative 04/20/2012 1725   BILIRUBINUR Negative 04/20/2012 1725   KETONESUR Negative 04/20/2012 1725   PROTEINUR Negative 04/20/2012 1725   NITRITE Negative 04/20/2012 1725   LEUKOCYTESUR Negative 04/20/2012 1725    Pertinent Imaging: none  Assessment & Plan: The patient has mixed incontinence and mild bedwetting.  Both components seem to be quite significant.  The role of urodynamics was discussed.  Urodynamics ordered.  Follow-up care will be here  1. Mixed stress and urge urinary incontinence  - Bladder Scan (Post Void Residual) in office - Urinalysis, Complete   No Follow-up on file.  Reece Packer,  MD  [facility] [address], [place], [place] [address] [phone]

## 2017-04-21 NOTE — Addendum Note (Signed)
Addended by: Kyra Manges on: 04/21/2017 09:59 AM   Modules accepted: Orders

## 2017-04-24 LAB — CULTURE, URINE COMPREHENSIVE

## 2017-05-26 ENCOUNTER — Encounter: Payer: Self-pay | Admitting: Family Medicine

## 2017-05-26 ENCOUNTER — Ambulatory Visit (INDEPENDENT_AMBULATORY_CARE_PROVIDER_SITE_OTHER): Payer: Medicaid Other | Admitting: Family Medicine

## 2017-05-26 VITALS — BP 130/87 | HR 76 | Temp 98.3°F | Resp 16 | Ht 59.0 in | Wt 223.0 lb

## 2017-05-26 DIAGNOSIS — N3946 Mixed incontinence: Secondary | ICD-10-CM

## 2017-05-26 DIAGNOSIS — Z6841 Body Mass Index (BMI) 40.0 and over, adult: Secondary | ICD-10-CM

## 2017-05-26 DIAGNOSIS — E118 Type 2 diabetes mellitus with unspecified complications: Secondary | ICD-10-CM

## 2017-05-26 DIAGNOSIS — F3175 Bipolar disorder, in partial remission, most recent episode depressed: Secondary | ICD-10-CM

## 2017-05-26 LAB — POCT GLYCOSYLATED HEMOGLOBIN (HGB A1C): HEMOGLOBIN A1C: 7 — AB (ref ?–5.7)

## 2017-05-26 NOTE — Progress Notes (Signed)
Subjective:    Patient ID: Darlene Barber, female    DOB: [birth year], 52 y.o.   MRN: [id number]  Darlene Barber is a 52 y.o. female presenting on 05/26/2017 for Anxiety (little improvement)   HPI   Urinary Incontinence, Chronic, mixed stress / urge Followed by [facility], last seen 04/21/17 by Dr Matilde Sprang, with significant incontinence by history and had been started on Myrbetriq unsure if was able to proceed with this medicine due to side effects / cost and coverage required PA, now they are pursuing Urodynamic testing in [place] office, upcoming within 1 week then has follow-up office visit on 06/09/17 for further discussion and management  FOLLOW-UP Bipolar Depression / Anxiety Followed by Dr Jacqualine Code [facility], has not returned since last visit, has apt in 1 week now, she was switched on TCA from Elavil to Nortriptyline by me, after complained of inc appetite and wt gain among other side effects - Interval history with dramatic improvement, now without side effects, tolerating Nortriptyline 25mg  nightly very well, did not need to increase dose, improved insomnia and sleep and seems mood and anxiety are better controlled - Other Psych medicines have not changed, continues on Benztroptine, Clonidine, Latuda, Melatonin, also on Lyrica for chronic pain - She reports that had mild set back recently with one of her grandchildren being injured, she worries about them, and says she focuses more on their well being than her own, she is always a "mother" cared for her children and now grandchildren, they keep her going [place] mother schizophrenic, she was treated for ths in past and had severe side effects. She is bipolar and has depression with some schizophrenic tendicies and mood  - Denies suicidal or homicidal ideation, insomnia  Mobid Obesity BMI >45 Reports difficulty losing weight, she has tried improving diet and more regular exercise including [employer] up to several days a week over 6  months, limited progress, and reports diet has improved dramatically, especially on med change with less inc appetite on med side effect. Weight seems stable 220 to 223, without dramatic change  Additionally - Type 2 DM - no longer on medicine, due for A1c. Last 6.4. Has come off Metformin in past.  Depression screen [facility] 2/9 05/26/2017 08/20/2016  Decreased Interest 1 2  Down, Depressed, Hopeless 0 1  PHQ - 2 Score 1 3  Altered sleeping 3 2  Tired, decreased energy 2 2  Change in appetite 2 2  Feeling bad or failure about yourself  1 1  Trouble concentrating 0 2  Moving slowly or fidgety/restless 1 3  Suicidal thoughts 0 2  PHQ-9 Score 10 17  Difficult doing work/chores Not difficult at all -   GAD 7 : Generalized Anxiety Score 05/26/2017 08/20/2016  Nervous, Anxious, on Edge 2 3  Control/stop worrying 3 3  Worry too much - different things 2 3  Trouble relaxing 2 3  Restless 2 3  Easily annoyed or irritable 1 3  Afraid - awful might happen 1 3  Total GAD 7 Score 13 21  Anxiety Difficulty Not difficult at all Very difficult     Social History   Tobacco Use  . Smoking status: Former Smoker    Packs/day: 1.00    Years: 30.00    Pack years: 30.00    Last attempt to quit: 01/2016    Years since quitting: 1.3  . Smokeless tobacco: Former Network engineer Use Topics  . Alcohol use: Yes    Comment: occ  .  Drug use: Yes    Types: Marijuana    Comment: Current use    Review of Systems Per HPI unless specifically indicated above     Objective:    BP 130/87   Pulse 76   Temp 98.3 F (36.8 C) (Oral)   Resp 16   Ht 4\' 11"  (1.499 m)   Wt 223 lb (101.2 kg)   BMI 45.04 kg/m   Wt Readings from Last 3 Encounters:  05/26/17 223 lb (101.2 kg)  04/21/17 222 lb (100.7 kg)  02/25/17 221 lb (100.2 kg)    Physical Exam  Constitutional: She is oriented to person, place, and time. She appears well-developed and well-nourished. No distress.  Well-appearing, comfortable,  cooperative, obese  HENT:  Head: Normocephalic and atraumatic.  Mouth/Throat: Oropharynx is clear and moist.  Eyes: Conjunctivae are normal. Right eye exhibits no discharge. Left eye exhibits no discharge.  Neck: Normal range of motion. Neck supple. No thyromegaly present.  Cardiovascular: Normal rate, regular rhythm, normal heart sounds and intact distal pulses.  No murmur heard. Pulmonary/Chest: Effort normal and breath sounds normal. No respiratory distress. She has no wheezes. She has no rales.  Musculoskeletal: Normal range of motion. She exhibits no edema.  Lymphadenopathy:    She has no cervical adenopathy.  Neurological: She is alert and oriented to person, place, and time.  Skin: Skin is warm and dry. No rash noted. She is not diaphoretic. No erythema.  Psychiatric: She has a normal mood and affect. Her behavior is normal.  Well groomed, good eye contact, normal speech and thoughts  Nursing note and vitals reviewed.    Recent Labs    11/12/16 1109 05/26/17 1719  HGBA1C 6.4* 7.0*    Results for orders placed or performed in visit on 05/26/17  POCT glycosylated hemoglobin (Hb A1C)  Result Value Ref Range   Hemoglobin A1C 7.0 (A) 5.7      Assessment & Plan:   Problem List Items Addressed This Visit    Bipolar disorder ([place]) - Primary    Stable currently without flare Improved PHQ and GAD scoring Followed by Psychiatry [facility] Dr Jacqualine Code Continues on variety of meds, including recently adjusted Nortriptyline 25mg  nightly with improvement, instead of Elavil      Controlled type 2 diabetes mellitus with complication, without long-term current use of insulin ([place])    Not focus of visit Checked A1c POC since due, result 7.0, previous 6.4, so increased but near goal. Concern limited weight loss - After visit called back to review options, consider GLP1 vs SGLT2 in near future, discussed options, she should look into these for DM and wt loss, or can resume metformin -  Otherwise focus on lifestyle until next visit and re-check A1c and make determination on treatment      Relevant Orders   POCT glycosylated hemoglobin (Hb A1C) (Completed)   Mixed stress and urge urinary incontinence    Followed by [facility] Continue current plan with upcoming Urodynamics then f/u      Morbid obesity with BMI of 40.0-44.9, adult ([place])    Abnormal BMI >45 with limited improvement despite lifestyle improves diet / exercise A1c inc 7.0 No longer significant med side effect on prior psych med, still some factor with her constellation of medications  Encourage improve lifestyle diet and exercise, keep up good work Reviewed may benefit from DM med - SGLT2 vs GLP1 for wt loss, reconsider in future  Also offered referral to [facility] Dr Leafy Ro, may  refer if interested         No orders of the defined types were placed in this encounter.   Follow up plan: Return in about 6 months (around 11/26/2017) for Annual Physical.  Future labs ordered for 11/19/17  [name], [facility] 05/27/2017, 12:38 AM

## 2017-05-26 NOTE — Patient Instructions (Addendum)
Thank you for coming to the office today.  1.   Keep up the good work overall  Continue trying to improve lifestyle, emphasis on diet choices as we discussed.  Consider future referral to this program if interested   WEIGHT MANAGEMENT  Dr Dennard Nip  [facility] [place], [place] [address] Ph: [phone]  They offer informational sessions to learn about it in future if you like  Also if you are interested there are very good Diabetes medicines that lower weight as well such as the following  Call insurance find cost and coverage of the following  1. Ozempic (Semaglutide injection) - start 0.25mg  weekly for 4 weeks then increase to 0.5mg  weekly - This one has best benefit of weight loss and reducing Cardiovascular events  2. Bydureon BCise (Exenatide ER) - once weekly - this is my preference, very good medicine well tolerated, less side effects of nausea, upset stomach. No dose changes. Cost and coverage is the problem, but we may be able to get it with the coupon card  3. Trulicity (Dulaglutide) - once weekly - this is very good one, usually one of my top choices as well, two doses, 0.75 (likely we would start) and 1.5 max dose. We can use coupon card here too  4. Victoza (Liraglutide) - once DAILY - 3 dose changes 0.6, 1.2 and 1.8, side effects nausea, upset stomach higher on this one but it is still very effective medicine  Or pill form for - Jardiance or Wilder Glade as other options.  DUE for FASTING BLOOD WORK (no food or drink after midnight before the lab appointment, only water or coffee without cream/sugar on the morning of)  SCHEDULE "Lab Only" visit in the morning at the clinic for lab draw in 6 MONTHS   - Make sure Lab Only appointment is at about 1 week before your next appointment, so that results will be available  For Lab Results, once available within 2-3 days of blood draw, you can can log in to MyChart online to  view your results and a brief explanation. Also, we can discuss results at next follow-up visit.   Please schedule a Follow-up Appointment to: Return in about 6 months (around 11/26/2017) for Annual Physical.  If you have any other questions or concerns, please feel free to call the office or send a message through [place]. You may also schedule an earlier appointment if necessary.  Additionally, you may be receiving a survey about your experience at our office within a few days to 1 week by e-mail or mail. We value your feedback.  [name], DO [place]

## 2017-05-27 ENCOUNTER — Encounter: Payer: Self-pay | Admitting: Family Medicine

## 2017-05-27 ENCOUNTER — Other Ambulatory Visit: Payer: Self-pay | Admitting: Family Medicine

## 2017-05-27 DIAGNOSIS — Z6841 Body Mass Index (BMI) 40.0 and over, adult: Secondary | ICD-10-CM

## 2017-05-27 DIAGNOSIS — F3175 Bipolar disorder, in partial remission, most recent episode depressed: Secondary | ICD-10-CM

## 2017-05-27 DIAGNOSIS — G4701 Insomnia due to medical condition: Secondary | ICD-10-CM

## 2017-05-27 DIAGNOSIS — R718 Other abnormality of red blood cells: Secondary | ICD-10-CM

## 2017-05-27 DIAGNOSIS — Z0000 Encounter for general adult medical examination without abnormal findings: Secondary | ICD-10-CM

## 2017-05-27 DIAGNOSIS — N3946 Mixed incontinence: Secondary | ICD-10-CM

## 2017-05-27 DIAGNOSIS — M159 Polyosteoarthritis, unspecified: Secondary | ICD-10-CM

## 2017-05-27 DIAGNOSIS — R635 Abnormal weight gain: Secondary | ICD-10-CM

## 2017-05-27 DIAGNOSIS — I10 Essential (primary) hypertension: Secondary | ICD-10-CM

## 2017-05-27 DIAGNOSIS — Z79899 Other long term (current) drug therapy: Secondary | ICD-10-CM

## 2017-05-27 DIAGNOSIS — M150 Primary generalized (osteo)arthritis: Secondary | ICD-10-CM

## 2017-05-27 DIAGNOSIS — E118 Type 2 diabetes mellitus with unspecified complications: Secondary | ICD-10-CM

## 2017-05-27 NOTE — Assessment & Plan Note (Signed)
Not focus of visit Checked A1c POC since due, result 7.0, previous 6.4, so increased but near goal. Concern limited weight loss - After visit called back to review options, consider GLP1 vs SGLT2 in near future, discussed options, she should look into these for DM and wt loss, or can resume metformin - Otherwise focus on lifestyle until next visit and re-check A1c and make determination on treatment

## 2017-05-27 NOTE — Assessment & Plan Note (Signed)
Followed by [facility] Continue current plan with upcoming Urodynamics then f/u

## 2017-05-27 NOTE — Assessment & Plan Note (Addendum)
Stable currently without flare Improved PHQ and GAD scoring Followed by Psychiatry [facility] Dr Zoila Shutter on variety of meds, including recently adjusted Nortriptyline 25mg  nightly with improvement, instead of Elavil

## 2017-05-27 NOTE — Assessment & Plan Note (Signed)
Abnormal BMI >45 with limited improvement despite lifestyle improves diet / exercise A1c inc 7.0 No longer significant med side effect on prior psych med, still some factor with her constellation of medications  Encourage improve lifestyle diet and exercise, keep up good work Reviewed may benefit from DM med - SGLT2 vs GLP1 for wt loss, reconsider in future  Also offered referral to [facility] Dr Leafy Ro, may refer if interested

## 2017-06-02 ENCOUNTER — Other Ambulatory Visit: Payer: Self-pay | Admitting: Urology

## 2017-06-03 ENCOUNTER — Telehealth: Payer: Self-pay | Admitting: Family Medicine

## 2017-06-03 DIAGNOSIS — E118 Type 2 diabetes mellitus with unspecified complications: Secondary | ICD-10-CM

## 2017-06-03 MED ORDER — EXENATIDE ER 2 MG ~~LOC~~ PEN
2.0000 mg | PEN_INJECTOR | SUBCUTANEOUS | 5 refills | Status: DC
Start: 2017-06-03 — End: 2017-10-27

## 2017-06-03 NOTE — Telephone Encounter (Signed)
Pt thought a weekly injection for diabetes was going to be sent to pharmacy.  Her call back number is  [phone]

## 2017-06-03 NOTE — Telephone Encounter (Signed)
Reviewed chart. Last seen by me on 05/26/17. Diabetes was not the primary focus of this visit, but we did check A1c it was elevated at 7.0, we discussed the possible option of new GLP1 injectable medicine.  I did not prescribe one, and asked that she check with insurance first and consider this option and let me know if she was interested to start it.  ------------------------------  Since she is requesting the medicine, now I will go ahead and send in Bydureon Pen to [facility], it is once weekly injection, she may check with pharmacy for instructions on how to use the pen, we do not have demo pen here available.  It is the only weekly injection option covered by Medicaid.  [name], [facility] 06/03/2017, 5:19 PM

## 2017-06-04 NOTE — Telephone Encounter (Signed)
Pt.notified

## 2017-06-09 ENCOUNTER — Encounter: Payer: Self-pay | Admitting: Urology

## 2017-06-09 ENCOUNTER — Ambulatory Visit (INDEPENDENT_AMBULATORY_CARE_PROVIDER_SITE_OTHER): Payer: Medicaid Other | Admitting: Urology

## 2017-06-09 VITALS — BP 137/92 | HR 83 | Ht 59.0 in | Wt 228.0 lb

## 2017-06-09 DIAGNOSIS — N3946 Mixed incontinence: Secondary | ICD-10-CM | POA: Diagnosis not present

## 2017-06-09 MED ORDER — MIRABEGRON ER 50 MG PO TB24: 50.0000 mg | ORAL_TABLET | Freq: Every day | ORAL | 11 refills | Status: DC

## 2017-06-09 NOTE — Progress Notes (Signed)
06/09/2017 10:59 AM   Darlene Barber 1965-05-01 [id number]  Referring provider: Olin Hauser, DO [address] [place], [place] [address]  Chief Complaint  Patient presents with  . Follow-up    UDS results    HPI: Patient was consulted for worsening incontinence over 1 year especially since November.  She leaks with coughing sneezing standing and bending.  She has urge incontinence that is quite sudden.  I think she has small volume bedwetting.  She wears 3 pads a day that can be quite wet.  She voids every 2 or 3 hours and is no nocturia.  Her flow is good.  She may have been a partial responder to one medication a year ago.  Mild grade 2 hypermobility of the bladder neck with a negative cough test with a mild cough.  Grade 1 cystocele and no rectocele  The patient has mixed incontinence and mild bedwetting.  Both components seem to be quite significant.    Today Frequency and incontinence are stable On urodynamics the patient was catheterized for 100 mL.  Bladder capacity was 622 mL.  The bladder was unstable reaching a pressure of 5 cm of water Feltus urgency but she did not leak.  She did note that she leaks when she goes from a sitting to standing position and that her urges will hit her suddenly.  She did not leak with a Valsalva pressure of 122 cm of water.  During voluntary voiding she voided 575 mL.  Maximal flow was 8 mils per second.  Maximum voiding pressure is 38 semis water.  She emptied efficiently.  She was straining some.  EMG activity increased during the voiding phase.  Bladder neck descent at 1 or 2 cm.  The details of the urodynamics are signed and dictated     PMH: Past Medical History:  Diagnosis Date  . Allergy   . Anxiety   . Depression   . Diabetes mellitus without complication ([place])   . Frequent headaches   . Glaucoma   . Hypertension   . Sleep apnea     Surgical History: Past Surgical History:  Procedure Laterality Date  . CESAREAN  SECTION     x2  . CHOLECYSTECTOMY    . HYSTERECTOMY ABDOMINAL WITH SALPINGECTOMY  1998  . HYSTEROSCOPY      Home Medications:  Allergies as of 06/09/2017      Reactions   [place] Other (See Comments)   Feels like throat is closing, itchy Feels like throat is closing, itchy Feels like throat is closing, itchy   Tape Rash      Medication List        Accurate as of 06/09/17 10:59 AM. Always use your most recent med list.          benztropine 0.5 MG tablet Commonly known as:  COGENTIN Take 0.5 mg by mouth 2 (two) times daily.   cetirizine 10 MG tablet Commonly known as:  ZYRTEC Take 10 mg by mouth.   clobetasol cream 0.05 % Commonly known as:  TEMOVATE Frequency:BID   Dosage:0.0     Instructions:  Note:Dose: 0.05 %   cloNIDine 0.1 MG tablet Commonly known as:  CATAPRES Take 1 tablet (0.1 mg total) by mouth daily.   divalproex 250 MG DR tablet Commonly known as:  DEPAKOTE Take 250 mg by mouth 3 (three) times daily.   estradiol 1 MG tablet Commonly known as:  ESTRACE Take 1 tablet (1 mg total) by mouth daily.  Exenatide ER 2 MG Pen Commonly known as:  BYDUREON Inject 2 mg into the skin once a week.   fluticasone 50 MCG/ACT nasal spray Commonly known as:  FLONASE USE 2 SPRAYS IN EACH NOSTRIL EVERYDAY AS NEEDED FOR SINUS CONGESTION   lurasidone 80 MG Tabs tablet Commonly known as:  LATUDA Take 80 mg by mouth.   Melatonin 5 MG Caps Take by mouth.   nortriptyline 25 MG capsule Commonly known as:  PAMELOR Take 25 mg by mouth at bedtime.   omeprazole 40 MG capsule Commonly known as:  PRILOSEC TAKE 1 CAPSULE BY MOUTH ONCE DAILY. TAKE 30 MINUTES PRIOR TO BREAKFAST   polyethylene glycol powder powder Commonly known as:  GLYCOLAX/MIRALAX TAKE 17GM (DISSOLVED IN WATER) BY MOUTH TWICE A DAY FOR CONSTIPATION   pregabalin 150 MG capsule Commonly known as:  LYRICA Take by mouth.   promethazine 25 MG tablet Commonly known as:  PHENERGAN TAKE 1 TABLET BY MOUTH  EVERY SIX HOURS AS NEEDED FOR NAUSEA VOMITING   propranolol ER 120 MG 24 hr capsule Commonly known as:  INDERAL LA Take 1 capsule (120 mg total) by mouth daily.   PROVENTIL HFA 108 (90 Base) MCG/ACT inhaler Generic drug:  albuterol INHALE 2 PUFFS INTO THE LUNGS EVERY 4 HOURS AS NEEDED FOR WHEEZING OR SHORTNESS OF BREATH   ranitidine 150 MG capsule Commonly known as:  ZANTAC Take 150 mg by mouth.   valACYclovir 1000 MG tablet Commonly known as:  VALTREX TAKE 1 TABLET BY MOUTH DAILY       Allergies:  Allergies  Allergen Reactions  . [place] Other (See Comments)    Feels like throat is closing, itchy Feels like throat is closing, itchy Feels like throat is closing, itchy  . Tape Rash    Family History: Family History  Problem Relation Age of Onset  . Heart failure Maternal Grandmother   . Alzheimer's disease Maternal Grandfather   . Emphysema Paternal Grandmother   . Hodgkin's lymphoma Mother   . Diabetes Maternal Aunt   . Diabetes Maternal Uncle   . Cancer Other   . Breast cancer Neg Hx     Social History:  reports that she quit smoking about 16 months ago. She has a 30.00 pack-year smoking history. She has quit using smokeless tobacco. She reports that she drinks alcohol. She reports that she has current or past drug history. Drug: Marijuana.  ROS: UROLOGY Frequent Urination?: No Hard to postpone urination?: Yes Burning/pain with urination?: No Get up at night to urinate?: No Leakage of urine?: Yes Urine stream starts and stops?: No Trouble starting stream?: No Do you have to strain to urinate?: No Blood in urine?: No Urinary tract infection?: No Sexually transmitted disease?: No Injury to kidneys or bladder?: No Painful intercourse?: No Weak stream?: No Currently pregnant?: No Vaginal bleeding?: No Last menstrual period?: n  Gastrointestinal Nausea?: No Vomiting?: No Indigestion/heartburn?: No Diarrhea?: No Constipation?:  Yes  Constitutional Fever: No Night sweats?: No Weight loss?: No Fatigue?: Yes  Skin Skin rash/lesions?: No Itching?: No  Eyes Blurred vision?: No Double vision?: No  Ears/Nose/Throat Sore throat?: No Sinus problems?: Yes  Hematologic/Lymphatic Swollen glands?: No Easy bruising?: No  Cardiovascular Leg swelling?: No Chest pain?: No  Respiratory Cough?: No Shortness of breath?: No  Endocrine Excessive thirst?: Yes  Musculoskeletal Back pain?: Yes Joint pain?: No  Neurological Headaches?: No Dizziness?: No  Psychologic Depression?: No Anxiety?: Yes  Physical Exam: BP (!) 137/92 (BP Location: Right Arm, Patient Position: Sitting,  Cuff Size: Large)   Pulse 83   Ht 4\' 11"  (8.469 m)   Wt 103.4 kg (228 lb)   BMI 46.05 kg/m   Constitutional:  Alert and oriented, No acute distress.  Laboratory Data: Lab Results  Component Value Date   WBC 9.0 11/12/2016   HGB 14.9 11/12/2016   HCT 45.6 (H) 11/12/2016   MCV 100.4 (H) 11/12/2016   PLT 310 11/12/2016    Lab Results  Component Value Date   CREATININE 0.71 11/12/2016    No results found for: PSA  No results found for: TESTOSTERONE  Lab Results  Component Value Date   HGBA1C 7.0 (A) 05/26/2017    Urinalysis    Component Value Date/Time   COLORURINE Yellow 04/20/2012 1725   APPEARANCEUR Cloudy (A) 04/21/2017 0915   LABSPEC 1.010 04/20/2012 1725   PHURINE 6.0 04/20/2012 1725   GLUCOSEU Trace (A) 04/21/2017 0915   GLUCOSEU Negative 04/20/2012 1725   HGBUR Negative 04/20/2012 1725   BILIRUBINUR Positive (A) 04/21/2017 0915   BILIRUBINUR Negative 04/20/2012 1725   KETONESUR Negative 04/20/2012 1725   PROTEINUR 1+ (A) 04/21/2017 0915   PROTEINUR Negative 04/20/2012 1725   NITRITE Negative 04/21/2017 0915   NITRITE Negative 04/20/2012 1725   LEUKOCYTESUR Negative 04/21/2017 0915   LEUKOCYTESUR Negative 04/20/2012 1725    Pertinent Imaging: none  Assessment & Plan:  The patient has mild  stress incontinence and clinically an overactive bladder with urge incontinence and mild bedwetting.  I will try to help her with medical and behavioral therapy.  She may be a candidate to consider a sling in the future and I would want to re-quantitate her stress component.  Ongoing OAB symptoms and bedwetting would need to be discusse Pelvic floor exercises and fluid modifications also started.  Reassess in 5 weeks on Myrbetriq   There are no diagnoses linked to this encounter.  No follow-ups on file.  Reece Packer, MD  [facility] [address], [place], [place] [address] [phone]

## 2017-06-24 ENCOUNTER — Other Ambulatory Visit: Payer: Self-pay | Admitting: Family Medicine

## 2017-06-24 NOTE — Telephone Encounter (Signed)
Patient advised.

## 2017-06-24 NOTE — Telephone Encounter (Signed)
Pt. Called requesting a refill on Bydureon 2 mg

## 2017-06-24 NOTE — Telephone Encounter (Signed)
Attempted to contact the pt to notify her that she have refills on her medication.

## 2017-07-02 ENCOUNTER — Other Ambulatory Visit: Payer: Self-pay | Admitting: Family Medicine

## 2017-07-02 DIAGNOSIS — K219 Gastro-esophageal reflux disease without esophagitis: Secondary | ICD-10-CM

## 2017-07-21 ENCOUNTER — Encounter: Payer: Self-pay | Admitting: Urology

## 2017-07-21 ENCOUNTER — Ambulatory Visit: Payer: Medicaid Other | Admitting: Urology

## 2017-07-21 VITALS — BP 138/81 | HR 84 | Ht 59.0 in | Wt 221.5 lb

## 2017-07-21 DIAGNOSIS — N3946 Mixed incontinence: Secondary | ICD-10-CM

## 2017-07-21 LAB — URINALYSIS, COMPLETE
Bilirubin, UA: POSITIVE — AB
Glucose, UA: NEGATIVE
Leukocytes, UA: NEGATIVE
Nitrite, UA: NEGATIVE
PH UA: 6 (ref 5.0–7.5)
RBC UA: NEGATIVE
Specific Gravity, UA: >1.03 — ABNORMAL HIGH (ref 1.005–1.030)
UUROB: 1 mg/dL (ref 0.2–1.0)

## 2017-07-21 LAB — MICROSCOPIC EXAMINATION
RBC, UA: NONE SEEN /hpf (ref 0–2)
WBC UA: NONE SEEN /HPF (ref 0–5)

## 2017-07-21 MED ORDER — OXYBUTYNIN CHLORIDE ER 10 MG PO TB24: 10.0000 mg | ORAL_TABLET | Freq: Every day | ORAL | 11 refills | Status: DC

## 2017-07-21 MED ORDER — TOLTERODINE TARTRATE ER 4 MG PO CP24: 4.0000 mg | ORAL_CAPSULE | Freq: Every day | ORAL | 11 refills | Status: DC

## 2017-07-21 NOTE — Progress Notes (Signed)
07/21/2017 10:54 AM   Darlene Barber [birth year] [id number]  Referring provider: Olin Hauser, DO [address] [place], [place] [address]  Chief Complaint  Patient presents with  . Urinary Incontinence    HPI: Patient was consulted for worsening incontinence over 1 year especially since November. She leaks with coughing sneezing standing and bending. She has urge incontinence that is quite sudden. I think she has small volume bedwetting. She wears 3 pads a day that can be quite wet.  She voids every 2 or 3 hours and is no nocturia. Her flow is good. She may have been a partial responder to one medication a year ago.  Mild grade 2 hypermobility of the bladder neck with a negative cough test with a mild cough. Grade 1 cystocele and no rectocele  The patient has mixed incontinence and mild bedwetting. Both components seem to be quite significant.   On urodynamics the patient was catheterized for 100 mL.  Bladder capacity was 622 mL.  The bladder was unstable reaching a pressure of 5 cm of water felt as urgency but she did not leak.  She did note that she leaks when she goes from a sitting to standing position and that her urges will hit her suddenly.  She did not leak with a Valsalva pressure of 122 cm of water.  During voluntary voiding she voided 575 mL.  Maximal flow was 8 mils per second.  Maximum voiding pressure is 38 semis water.  She emptied efficiently.  She was straining some.  EMG activity increased during the voiding phase.  Bladder neck descent at 1 or 2 cm.    The patient has mild stress incontinence and clinically an overactive bladder with urge incontinence and mild bedwetting.  I will try to help her with medical and behavioral therapy. She may be a candidate to consider a sling in the future and I would want to re-quantitate her stress component.  Ongoing OAB symptoms and bedwetting would need to be discusse Pelvic floor exercises and fluid modifications  also started.    Today Frequency and incontinence are stable.  She did not respond to Myrbetriq Patient leaks with coughing but not that much unless she has a bad cough with bronchitis.  She is trying to stop smoking.  Clinically not infected     PMH: Past Medical History:  Diagnosis Date  . Allergy   . Anxiety   . Depression   . Diabetes mellitus without complication ([place])   . Frequent headaches   . Glaucoma   . Hypertension   . Sleep apnea     Surgical History: Past Surgical History:  Procedure Laterality Date  . CESAREAN SECTION     x2  . CHOLECYSTECTOMY    . HYSTERECTOMY ABDOMINAL WITH SALPINGECTOMY  1998  . HYSTEROSCOPY      Home Medications:  Allergies as of 07/21/2017      Reactions   Piper Other (See Comments)   Feels like throat is closing, itchy Feels like throat is closing, itchy Feels like throat is closing, itchy   Tape Rash      Medication List        Accurate as of 07/21/17 10:54 AM. Always use your most recent med list.          benztropine 0.5 MG tablet Commonly known as:  COGENTIN Take 0.5 mg by mouth 2 (two) times daily.   cetirizine 10 MG tablet Commonly known as:  ZYRTEC Take 10 mg by  mouth.   clobetasol cream 0.05 % Commonly known as:  TEMOVATE Frequency:BID   Dosage:0.0     Instructions:  Note:Dose: 0.05 %   cloNIDine 0.1 MG tablet Commonly known as:  CATAPRES Take 1 tablet (0.1 mg total) by mouth daily.   divalproex 250 MG DR tablet Commonly known as:  DEPAKOTE Take 250 mg by mouth 3 (three) times daily.   estradiol 1 MG tablet Commonly known as:  ESTRACE Take 1 tablet (1 mg total) by mouth daily.   Exenatide ER 2 MG Pen Commonly known as:  BYDUREON Inject 2 mg into the skin once a week.   fluticasone 50 MCG/ACT nasal spray Commonly known as:  FLONASE USE 2 SPRAYS IN EACH NOSTRIL EVERYDAY AS NEEDED FOR SINUS CONGESTION   lurasidone 80 MG Tabs tablet Commonly known as:  LATUDA Take 80 mg by mouth.   Melatonin  5 MG Caps Take by mouth.   mirabegron ER 50 MG Tb24 tablet Commonly known as:  MYRBETRIQ Take 1 tablet (50 mg total) by mouth daily.   nortriptyline 25 MG capsule Commonly known as:  PAMELOR Take 25 mg by mouth at bedtime.   omeprazole 40 MG capsule Commonly known as:  PRILOSEC TAKE 1 CAPSULE BY MOUTH ONCE DAILY. TAKE 30 MINUTES PRIOR TO BREAKFAST   polyethylene glycol powder powder Commonly known as:  GLYCOLAX/MIRALAX TAKE 17GM (DISSOLVED IN WATER) BY MOUTH TWICE A DAY FOR CONSTIPATION   pregabalin 150 MG capsule Commonly known as:  LYRICA Take by mouth.   promethazine 25 MG tablet Commonly known as:  PHENERGAN TAKE 1 TABLET BY MOUTH EVERY SIX HOURS AS NEEDED FOR NAUSEA VOMITING   propranolol ER 120 MG 24 hr capsule Commonly known as:  INDERAL LA Take 1 capsule (120 mg total) by mouth daily.   PROVENTIL HFA 108 (90 Base) MCG/ACT inhaler Generic drug:  albuterol INHALE 2 PUFFS INTO THE LUNGS EVERY 4 HOURS AS NEEDED FOR WHEEZING OR SHORTNESS OF BREATH   ranitidine 150 MG capsule Commonly known as:  ZANTAC Take 150 mg by mouth.   valACYclovir 1000 MG tablet Commonly known as:  VALTREX TAKE 1 TABLET BY MOUTH DAILY       Allergies:  Allergies  Allergen Reactions  . Piper Other (See Comments)    Feels like throat is closing, itchy Feels like throat is closing, itchy Feels like throat is closing, itchy  . Tape Rash    Family History: Family History  Problem Relation Age of Onset  . Heart failure Maternal Grandmother   . Alzheimer's disease Maternal Grandfather   . Emphysema Paternal Grandmother   . Hodgkin's lymphoma Mother   . Diabetes Maternal Aunt   . Diabetes Maternal Uncle   . Cancer Other   . Breast cancer Neg Hx     Social History:  reports that she quit smoking about 18 months ago. She has a 30.00 pack-year smoking history. She has quit using smokeless tobacco. She reports that she drinks alcohol. She reports that she has current or past drug  history. Drug: Marijuana.  ROS: UROLOGY Frequent Urination?: Yes Hard to postpone urination?: Yes Burning/pain with urination?: No Get up at night to urinate?: No Leakage of urine?: Yes Urine stream starts and stops?: No Trouble starting stream?: No Do you have to strain to urinate?: No Blood in urine?: No Urinary tract infection?: No Sexually transmitted disease?: No Injury to kidneys or bladder?: No Painful intercourse?: No Weak stream?: No Currently pregnant?: No Vaginal bleeding?: No Last menstrual period?:  n  Gastrointestinal Nausea?: No Vomiting?: No Indigestion/heartburn?: No Diarrhea?: No Constipation?: No  Constitutional Fever: No Night sweats?: No Weight loss?: No Fatigue?: Yes  Skin Skin rash/lesions?: No Itching?: No  Eyes Blurred vision?: No Double vision?: No  Ears/Nose/Throat Sore throat?: No Sinus problems?: Yes  Hematologic/Lymphatic Swollen glands?: No Easy bruising?: No  Cardiovascular Leg swelling?: No Chest pain?: No  Respiratory Cough?: Yes Shortness of breath?: No  Endocrine Excessive thirst?: Yes  Musculoskeletal Back pain?: No Joint pain?: No  Neurological Headaches?: No Dizziness?: No  Psychologic Depression?: No Anxiety?: No  Physical Exam: BP 138/81 (BP Location: Right Arm, Patient Position: Sitting, Cuff Size: Large)   Pulse 84   Ht 4\' 11"  (1.499 m)   Wt 221 lb 8 oz (100.5 kg)   BMI 44.74 kg/m     Laboratory Data: Lab Results  Component Value Date   WBC 9.0 11/12/2016   HGB 14.9 11/12/2016   HCT 45.6 (H) 11/12/2016   MCV 100.4 (H) 11/12/2016   PLT 310 11/12/2016    Lab Results  Component Value Date   CREATININE 0.71 11/12/2016    No results found for: PSA  No results found for: TESTOSTERONE  Lab Results  Component Value Date   HGBA1C 7.0 (A) 05/26/2017    Urinalysis    Component Value Date/Time   COLORURINE Yellow 04/20/2012 1725   APPEARANCEUR Cloudy (A) 04/21/2017 0915    LABSPEC 1.010 04/20/2012 1725   PHURINE 6.0 04/20/2012 1725   GLUCOSEU Trace (A) 04/21/2017 0915   GLUCOSEU Negative 04/20/2012 1725   HGBUR Negative 04/20/2012 1725   BILIRUBINUR Positive (A) 04/21/2017 0915   BILIRUBINUR Negative 04/20/2012 1725   KETONESUR Negative 04/20/2012 1725   PROTEINUR 1+ (A) 04/21/2017 0915   PROTEINUR Negative 04/20/2012 1725   NITRITE Negative 04/21/2017 0915   NITRITE Negative 04/20/2012 1725   LEUKOCYTESUR Negative 04/21/2017 0915   LEUKOCYTESUR Negative 04/20/2012 1725    Pertinent Imaging:   Assessment & Plan: Reassess in 2 months.  Detrol LA and oxybutynin ER 10 mg given.  Re-quantitate goals and the role of a sling versus refractory therapy.  1. Mixed incontinence  - Urinalysis, Complete   No follow-ups on file.  Reece Packer, MD  [facility] [address] [place], [place] [address] [phone]

## 2017-07-28 ENCOUNTER — Telehealth: Payer: Self-pay

## 2017-07-28 NOTE — Telephone Encounter (Signed)
Pt insurance has denied coverage of Tolterodine Tartrate ER. I noticed you prescribed both Oxybutynin and Tolterodine at her last visit. Before insurance will cover Tolterodine she must try and fail two alternatives: Oxybutynin, Oxybutynin ER, Toviaz, or vesicare. Please advise if we should send in something other than Tolterodine or if pt should just continue with Oxybutynin alone.

## 2017-07-28 NOTE — Telephone Encounter (Signed)
Stick with oxybutynin

## 2017-08-06 ENCOUNTER — Telehealth: Payer: Self-pay | Admitting: Family Medicine

## 2017-08-06 NOTE — Telephone Encounter (Signed)
Pt. Called requesting a referral to a podiatry. Pt. Call back # is [phone]

## 2017-08-07 ENCOUNTER — Other Ambulatory Visit: Payer: Self-pay | Admitting: Family Medicine

## 2017-08-07 DIAGNOSIS — L600 Ingrowing nail: Secondary | ICD-10-CM

## 2017-08-07 NOTE — Telephone Encounter (Signed)
Can you call patient to clarify what is the diagnosis for referral to Podiatry, and which one? [facility] or [facility]?  [name], [facility] 08/07/2017, 10:16 AM

## 2017-08-07 NOTE — Telephone Encounter (Signed)
Patient had ingrown toenail right side and her daughter in law tried to cut it but now it's getting worst has pus and is painful and could be infected. Prefers [facility] pod[facility].

## 2017-08-13 ENCOUNTER — Other Ambulatory Visit: Payer: Self-pay | Admitting: Obstetrics & Gynecology

## 2017-08-13 ENCOUNTER — Other Ambulatory Visit: Payer: Self-pay | Admitting: Family Medicine

## 2017-08-13 DIAGNOSIS — I10 Essential (primary) hypertension: Secondary | ICD-10-CM

## 2017-08-13 NOTE — Telephone Encounter (Signed)
Sch appt

## 2017-08-26 ENCOUNTER — Other Ambulatory Visit: Payer: Self-pay | Admitting: Family Medicine

## 2017-09-15 ENCOUNTER — Other Ambulatory Visit: Payer: Self-pay | Admitting: Obstetrics & Gynecology

## 2017-09-15 ENCOUNTER — Encounter: Payer: Self-pay | Admitting: Urology

## 2017-09-15 ENCOUNTER — Ambulatory Visit: Payer: Medicaid Other | Admitting: Urology

## 2017-09-16 DIAGNOSIS — R251 Tremor, unspecified: Secondary | ICD-10-CM | POA: Diagnosis not present

## 2017-09-16 DIAGNOSIS — R200 Anesthesia of skin: Secondary | ICD-10-CM | POA: Diagnosis not present

## 2017-09-16 DIAGNOSIS — G4733 Obstructive sleep apnea (adult) (pediatric): Secondary | ICD-10-CM | POA: Diagnosis not present

## 2017-09-16 DIAGNOSIS — R51 Headache: Secondary | ICD-10-CM | POA: Diagnosis not present

## 2017-09-22 ENCOUNTER — Telehealth: Payer: Self-pay | Admitting: Obstetrics & Gynecology

## 2017-09-22 ENCOUNTER — Other Ambulatory Visit: Payer: Self-pay | Admitting: Obstetrics & Gynecology

## 2017-09-22 NOTE — Telephone Encounter (Signed)
Patient is schedule 10/02/17 with [facility]

## 2017-09-22 NOTE — Telephone Encounter (Signed)
-----   Message from Gae Dry, MD sent at 09/22/2017  1:34 PM EDT ----- Regarding: sch Annual [facility] Annual Will refill Rx until appt

## 2017-09-24 DIAGNOSIS — M5030 Other cervical disc degeneration, unspecified cervical region: Secondary | ICD-10-CM | POA: Diagnosis not present

## 2017-09-24 DIAGNOSIS — M5412 Radiculopathy, cervical region: Secondary | ICD-10-CM | POA: Diagnosis not present

## 2017-10-02 ENCOUNTER — Other Ambulatory Visit (HOSPITAL_COMMUNITY)
Admission: RE | Admit: 2017-10-02 | Discharge: 2017-10-02 | Disposition: A | Discharge status: Home / Self Care | Payer: Medicaid Other | Source: Ambulatory Visit | Attending: Obstetrics & Gynecology | Admitting: Obstetrics & Gynecology

## 2017-10-02 ENCOUNTER — Encounter: Payer: Self-pay | Admitting: Obstetrics & Gynecology

## 2017-10-02 ENCOUNTER — Ambulatory Visit (INDEPENDENT_AMBULATORY_CARE_PROVIDER_SITE_OTHER): Payer: Medicaid Other | Admitting: Obstetrics & Gynecology

## 2017-10-02 VITALS — BP 120/80 | Ht 60.0 in | Wt 219.0 lb

## 2017-10-02 DIAGNOSIS — Z0000 Encounter for general adult medical examination without abnormal findings: Secondary | ICD-10-CM | POA: Diagnosis not present

## 2017-10-02 DIAGNOSIS — Z6841 Body Mass Index (BMI) 40.0 and over, adult: Secondary | ICD-10-CM

## 2017-10-02 DIAGNOSIS — Z1231 Encounter for screening mammogram for malignant neoplasm of breast: Secondary | ICD-10-CM | POA: Diagnosis not present

## 2017-10-02 DIAGNOSIS — R5383 Other fatigue: Secondary | ICD-10-CM | POA: Diagnosis not present

## 2017-10-02 DIAGNOSIS — N951 Menopausal and female climacteric states: Secondary | ICD-10-CM | POA: Diagnosis not present

## 2017-10-02 DIAGNOSIS — Z1239 Encounter for other screening for malignant neoplasm of breast: Secondary | ICD-10-CM

## 2017-10-02 DIAGNOSIS — N393 Stress incontinence (female) (male): Secondary | ICD-10-CM

## 2017-10-02 DIAGNOSIS — Z1272 Encounter for screening for malignant neoplasm of vagina: Secondary | ICD-10-CM

## 2017-10-02 DIAGNOSIS — Z1211 Encounter for screening for malignant neoplasm of colon: Secondary | ICD-10-CM | POA: Diagnosis not present

## 2017-10-02 MED ORDER — CLONIDINE HCL 0.2 MG PO TABS: 0.2000 mg | ORAL_TABLET | Freq: Every day | ORAL | 12 refills | Status: DC

## 2017-10-02 NOTE — Progress Notes (Signed)
HPI:      [name] is a 52 y.o. [phone] who LMP was in the past w prior hysterectomy, she presents today for her annual examination.  The patient has continued OAB as well as LEAKAGE of URINE w ANY ACTIVITY;  Hot Flashes have improved somewhat and she is only taking Clonidine, not ERT. The patient is not currently sexually active. Herlast pap: approximate date 2013 and was normal and last mammogram: approximate date 2018 and was normal.  The patient does perform self breast exams.  There is no notable family history of breast or ovarian cancer in her family. The patient is not taking hormone replacement therapy. Patient denies post-menopausal vaginal bleeding.   The patient has regular exercise: yes. The patient denies current symptoms of depression.    GYN Hx: Last Colonoscopy:never ago.  Last DEXA: never ago.    PMHx: Past Medical History:  Diagnosis Date  . Allergy   . Anxiety   . Depression   . Diabetes mellitus without complication ([place])   . Frequent headaches   . Glaucoma   . Hypertension   . Sleep apnea    Past Surgical History:  Procedure Laterality Date  . CESAREAN SECTION     x2  . CHOLECYSTECTOMY    . HYSTERECTOMY ABDOMINAL WITH SALPINGECTOMY  1998  . HYSTEROSCOPY     Family History  Problem Relation Age of Onset  . Heart failure Maternal Grandmother   . Alzheimer's disease Maternal Grandfather   . Emphysema Paternal Grandmother   . Hodgkin's lymphoma Mother   . Diabetes Maternal Aunt   . Diabetes Maternal Uncle   . Cancer Other   . Breast cancer Neg Hx    Social History   Tobacco Use  . Smoking status: Former Smoker    Packs/day: 1.00    Years: 30.00    Pack years: 30.00    Last attempt to quit: 01/2016    Years since quitting: 1.7  . Smokeless tobacco: Former Network engineer Use Topics  . Alcohol use: Yes    Comment: occ  . Drug use: Yes    Types: Marijuana    Comment: Current use    Current Outpatient Medications:  .  benztropine  (COGENTIN) 0.5 MG tablet, Take 0.5 mg by mouth 2 (two) times daily., Disp: , Rfl:  .  clobetasol cream (TEMOVATE) 0.05 %, Frequency:BID   Dosage:0.0     Instructions:  Note:Dose: 0.05 %, Disp: , Rfl:  .  cyclobenzaprine (FLEXERIL) 10 MG tablet, Take 10 mg by mouth 3 (three) times daily as needed for muscle spasms., Disp: , Rfl:  .  etodolac (LODINE) 400 MG tablet, Take 400 mg by mouth 2 (two) times daily., Disp: , Rfl:  .  lurasidone (LATUDA) 80 MG TABS tablet, Take 80 mg by mouth., Disp: , Rfl:  .  nortriptyline (PAMELOR) 25 MG capsule, Take 25 mg by mouth at bedtime., Disp: , Rfl:  .  omeprazole (PRILOSEC) 40 MG capsule, TAKE 1 CAPSULE BY MOUTH ONCE DAILY. TAKE 30 MINUTES PRIOR TO BREAKFAST, Disp: 30 capsule, Rfl: 5 .  propranolol ER (INDERAL LA) 120 MG 24 hr capsule, TAKE 1 CAPSULE (120 MG TOTAL) BY MOUTH DAILY., Disp: 30 capsule, Rfl: 5 .  ranitidine (ZANTAC) 150 MG capsule, Take 150 mg by mouth., Disp: , Rfl:  .  cetirizine (ZYRTEC) 10 MG tablet, Take 10 mg by mouth., Disp: , Rfl:  .  cloNIDine (CATAPRES) 0.2 MG tablet, Take 1 tablet (0.2 mg total)  by mouth daily., Disp: 30 tablet, Rfl: 12 .  divalproex (DEPAKOTE) 250 MG DR tablet, Take 250 mg by mouth 3 (three) times daily., Disp: , Rfl:  .  Exenatide ER (BYDUREON) 2 MG PEN, Inject 2 mg into the skin once a week., Disp: 4 each, Rfl: 5 .  fluticasone (FLONASE) 50 MCG/ACT nasal spray, USE 2 SPRAYS IN EACH NOSTRIL EVERYDAY AS NEEDED FOR SINUS CONGESTION, Disp: 16 g, Rfl: 3 .  Melatonin 5 MG CAPS, Take by mouth., Disp: , Rfl:  .  mirabegron ER (MYRBETRIQ) 50 MG TB24 tablet, Take 1 tablet (50 mg total) by mouth daily., Disp: 30 tablet, Rfl: 11 .  oxybutynin (DITROPAN-XL) 10 MG 24 hr tablet, Take 1 tablet (10 mg total) by mouth daily., Disp: 30 tablet, Rfl: 11 .  polyethylene glycol powder (GLYCOLAX/MIRALAX) powder, TAKE 17GM (DISSOLVED IN WATER) BY MOUTH TWICE A DAY FOR CONSTIPATION, Disp: 1054 g, Rfl: 1 .  pregabalin (LYRICA) 150 MG capsule, Take  by mouth., Disp: , Rfl:  .  promethazine (PHENERGAN) 25 MG tablet, TAKE 1 TABLET BY MOUTH EVERY SIX HOURS AS NEEDED FOR NAUSEA VOMITING, Disp: 30 tablet, Rfl: 2 .  PROVENTIL HFA 108 (90 Base) MCG/ACT inhaler, INHALE 2 PUFFS INTO THE LUNGS EVERY 4 HOURS AS NEEDED FOR WHEEZING OR SHORTNESS OF BREATH, Disp: 6.7 g, Rfl: 2 .  tolterodine (DETROL LA) 4 MG 24 hr capsule, Take 1 capsule (4 mg total) by mouth daily., Disp: 30 capsule, Rfl: 11 .  valACYclovir (VALTREX) 1000 MG tablet, TAKE 1 TABLET BY MOUTH DAILY, Disp: 30 tablet, Rfl: 3 Allergies: Piper and Tape  Review of Systems  Constitutional: Positive for malaise/fatigue. Negative for chills and fever.  HENT: Positive for congestion. Negative for sinus pain and sore throat.   Eyes: Negative for blurred vision and pain.  Respiratory: Positive for cough and wheezing.   Cardiovascular: Negative for chest pain and leg swelling.  Gastrointestinal: Negative for abdominal pain, constipation, diarrhea, heartburn, nausea and vomiting.  Genitourinary: Negative for dysuria, frequency, hematuria and urgency.  Musculoskeletal: Negative for back pain, joint pain, myalgias and neck pain.  Skin: Negative for itching and rash.  Neurological: Negative for dizziness, tremors and weakness.  Endo/Heme/Allergies: Does not bruise/bleed easily.  Psychiatric/Behavioral: Negative for depression. The patient is not nervous/anxious and does not have insomnia.     Objective: BP 120/80   Ht 5' (1.524 m)   Wt 219 lb (99.3 kg)   BMI 42.77 kg/m   Filed Weights   10/02/17 1334  Weight: 219 lb (99.3 kg)   Body mass index is 42.77 kg/m. Physical Exam  Constitutional: She is oriented to person, place, and time. She appears well-developed and well-nourished. No distress.  Genitourinary: Rectum normal and vagina normal. Pelvic exam was performed with patient supine. There is no rash or lesion on the right labia. There is no rash or lesion on the left labia. Vagina exhibits  no lesion. No bleeding in the vagina. Right adnexum does not display mass and does not display tenderness. Left adnexum does not display mass and does not display tenderness.  Genitourinary Comments: Absent Uterus Absent cervix Vaginal cuff well healed  HENT:  Head: Normocephalic and atraumatic. Head is without laceration.  Right Ear: Hearing normal.  Left Ear: Hearing normal.  Nose: No epistaxis.  No foreign bodies.  Mouth/Throat: Uvula is midline, oropharynx is clear and moist and mucous membranes are normal.  Eyes: Pupils are equal, round, and reactive to light.  Neck: Normal range of motion.  Neck supple. No thyromegaly present.  Cardiovascular: Normal rate and regular rhythm. Exam reveals no gallop and no friction rub.  No murmur heard. Pulmonary/Chest: Effort normal and breath sounds normal. No respiratory distress. She has no wheezes. Right breast exhibits no mass, no skin change and no tenderness. Left breast exhibits no mass, no skin change and no tenderness.  Abdominal: Soft. Bowel sounds are normal. She exhibits no distension. There is no tenderness. There is no rebound.  Musculoskeletal: Normal range of motion.  Neurological: She is alert and oriented to person, place, and time. No cranial nerve deficit.  Skin: Skin is warm and dry.  Psychiatric: She has a normal mood and affect. Judgment normal.  Vitals reviewed.  Assessment: 1. Post menopausal syndrome   2. Annual physical exam   3. Screening for vaginal cancer   4. Screening for breast cancer   5. Screen for colon cancer   6. Obesity, Class III, BMI 40-49.9 (morbid obesity) (HCC)   7. Stress incontinence    Plan:            1.  Cervical Screening-  Pap smear done today  2. Breast screening- Exam annually and mammogram scheduled  3. Colonoscopy every 10 years, Hemoccult testing after age 4  4. Labs managed by PCP  5. Counseling for hormonal therapy: none  6. OAB and incontinence.  Discussed pessary and sling  surgery for GSI.  Consider.  Discuss w urologist who she sees again soon (tried different meds fro OAB, no help yet).  7. Hot flashes improving. Will go up on dose of Clonidine.  Cont off of ERT.  8. Obesity, encouraged diet and exercise    F/U  Return in about 1 year (around 10/03/2018) for Annual.  Barnett Applebaum, MD, [facility], [facility] 10/02/2017  2:05 PM

## 2017-10-02 NOTE — Patient Instructions (Signed)
PAP every 5 years Mammogram every year    Call [phone] to schedule at [facility] Colonoscopy every 10 years Labs yearly (with PCP)  Urethral Vaginal Sling A urethral vaginal sling procedure is surgery to correct urinary incontinence. Urinary incontinence is uncontrolled loss of urine. It is common in women who have had children and in older women. In this surgery, a strong piece of material is placed under the tube that drains the bladder (urethra). This sling is made of tension-free vaginal tape or nylon mesh. It fits under the urethra like a hammock. The sling is put in position to straighten, support, and hold the urethra in its normal position. Tell a health care provider about:  Any allergies you have.  All medicines you are taking, including vitamins, herbs, eye drops, creams, and over-the-counter medicines.  Any problems you or family members have had with anesthetic medicines.  Any blood disorders you have.  Any surgeries you have had.  Any medical conditions you have. What are the risks? Generally, this is a safe procedure. However, as with any procedure, complications can occur. Possible complications include:  Infection.  Excessive bleeding.  Damage to other organs.  Problems urinating properly for several days or weeks.  Problems from the use of anesthetics.  Return of the urinary incontinence.  What happens before the procedure?  Ask your health care provider about changing or stopping your regular medicines. You may need to stop taking certain medicines 1 week before the surgery.  Do not eat or drink anything for 6-8 hours before the surgery.  If you smoke, do not smoke for at least 2 weeks before the surgery.  Make plans to have someone drive you home after your hospital stay. Also arrange for someone to help you with activities during recovery. What happens during the procedure?  You will have general or spinal anesthesia. With general anesthesia, you  are asleep and will feel no pain. With spinal anesthesia, you are numb from the waist down, but you will still be awake.  A catheter is placed in your bladder to drain urine during the procedure.  An incision is made in your vagina and low on your belly in the hairline.  The sling material is passed around your bladder neck and sutured to the muscles to hold the urethra in its normal position.  The incisions are closed. What happens after the procedure?  You will be taken to a recovery area where your progress will be monitored closely. Your breathing, blood pressure, and pulse (vital signs) will be checked often. When you are stable, you will be moved to a regular hospital room.  You will have a catheter in place to drain your bladder. This will stay in place until your bladder is working properly on its own.  You may have a gauze packing in the vagina to prevent bleeding. This will be removed in 1-2 days.  You will likely need to stay in the hospital for 2-3 days. This information is not intended to replace advice given to you by your health care provider. Make sure you discuss any questions you have with your health care provider. Document Released: 12/05/2007 Document Revised: 08/03/2015 Document Reviewed: 08/14/2012 Elsevier Interactive Patient Education  [employer].

## 2017-10-03 LAB — CYTOLOGY - PAP: Diagnosis: NEGATIVE

## 2017-10-07 DIAGNOSIS — K219 Gastro-esophageal reflux disease without esophagitis: Secondary | ICD-10-CM | POA: Diagnosis not present

## 2017-10-07 DIAGNOSIS — R1013 Epigastric pain: Secondary | ICD-10-CM | POA: Diagnosis not present

## 2017-10-07 DIAGNOSIS — R198 Other specified symptoms and signs involving the digestive system and abdomen: Secondary | ICD-10-CM | POA: Diagnosis not present

## 2017-10-07 DIAGNOSIS — K9289 Other specified diseases of the digestive system: Secondary | ICD-10-CM | POA: Diagnosis not present

## 2017-10-08 DIAGNOSIS — R200 Anesthesia of skin: Secondary | ICD-10-CM | POA: Diagnosis not present

## 2017-10-08 DIAGNOSIS — R202 Paresthesia of skin: Secondary | ICD-10-CM | POA: Diagnosis not present

## 2017-10-08 DIAGNOSIS — M79602 Pain in left arm: Secondary | ICD-10-CM | POA: Diagnosis not present

## 2017-10-08 DIAGNOSIS — M542 Cervicalgia: Secondary | ICD-10-CM | POA: Diagnosis not present

## 2017-10-09 ENCOUNTER — Other Ambulatory Visit: Payer: Self-pay | Admitting: Obstetrics & Gynecology

## 2017-10-09 ENCOUNTER — Other Ambulatory Visit: Payer: Self-pay | Admitting: Family Medicine

## 2017-10-17 DIAGNOSIS — M542 Cervicalgia: Secondary | ICD-10-CM | POA: Insufficient documentation

## 2017-10-20 ENCOUNTER — Encounter: Payer: Self-pay | Admitting: *Deleted

## 2017-10-20 NOTE — Progress Notes (Signed)
.  hh

## 2017-10-21 DIAGNOSIS — M5030 Other cervical disc degeneration, unspecified cervical region: Secondary | ICD-10-CM | POA: Diagnosis not present

## 2017-10-21 DIAGNOSIS — M5412 Radiculopathy, cervical region: Secondary | ICD-10-CM | POA: Diagnosis not present

## 2017-10-27 ENCOUNTER — Other Ambulatory Visit: Payer: Self-pay | Admitting: Family Medicine

## 2017-10-27 DIAGNOSIS — E118 Type 2 diabetes mellitus with unspecified complications: Secondary | ICD-10-CM

## 2017-11-04 ENCOUNTER — Other Ambulatory Visit: Payer: Self-pay

## 2017-11-04 ENCOUNTER — Ambulatory Visit: Payer: Medicaid Other | Admitting: Family Medicine

## 2017-11-04 ENCOUNTER — Encounter: Payer: Self-pay | Admitting: Family Medicine

## 2017-11-04 VITALS — BP 120/56 | HR 99 | Temp 98.9°F | Resp 16 | Ht 60.0 in | Wt 219.0 lb

## 2017-11-04 DIAGNOSIS — J3089 Other allergic rhinitis: Secondary | ICD-10-CM | POA: Diagnosis not present

## 2017-11-04 DIAGNOSIS — Z1211 Encounter for screening for malignant neoplasm of colon: Secondary | ICD-10-CM

## 2017-11-04 DIAGNOSIS — J0110 Acute frontal sinusitis, unspecified: Secondary | ICD-10-CM | POA: Diagnosis not present

## 2017-11-04 MED ORDER — AMOXICILLIN-POT CLAVULANATE 875-125 MG PO TABS: 1.0000 | ORAL_TABLET | Freq: Two times a day (BID) | ORAL | 0 refills | Status: DC

## 2017-11-04 MED ORDER — IPRATROPIUM BROMIDE 0.06 % NA SOLN: 2.0000 | Freq: Four times a day (QID) | NASAL | 0 refills | Status: DC

## 2017-11-04 MED ORDER — FLUTICASONE PROPIONATE 50 MCG/ACT NA SUSP: 1.0000 | Freq: Every day | NASAL | 3 refills | Status: DC

## 2017-11-04 NOTE — Patient Instructions (Addendum)
Thank you for coming to the office today.  1. It sounds like you have a Sinusitis (Bacterial Infection) - this most likely started as an Upper Respiratory Virus that has settled into an infection. Allergies can also cause this. - Start Augmentin 1 pill twice daily (breakfast and dinner, with food and plenty of water) for 10 days, complete entire course, do not stop early even if feeling better  Start Atrovent nasal spray decongestant 2 sprays in each nostril up to 4 times daily for 7 days  - Continue Cetirizine (Zyrtec) daily for allergies  - After 1 week - may resume Flonase 2 sprays in each nostril daily for 4-6 weeks to prevent return of symptoms  - Recommend to keep using Nasal Saline spray multiple times a day to help flush out congestion and clear sinuses - Improve hydration by drinking plenty of clear fluids (water, gatorade) to reduce secretions and thin congestion - Congestion draining down throat can cause irritation. May try warm herbal tea with honey, cough drops - Can take Tylenol or Ibuprofen as needed for fevers  May take Mucinex as needed for up to 1 week  If you develop persistent fever >101F for at least 3 consecutive days, headaches with sinus pain or pressure or persistent earache, please schedule a follow-up evaluation within next few days to week.  Please schedule a Follow-up Appointment to: Return in about 1 week (around 11/11/2017), or if symptoms worsen or fail to improve, for sinusitis.  If you have any other questions or concerns, please feel free to call the office or send a message through [facility]. You may also schedule an earlier appointment if necessary.  Additionally, you may be receiving a survey about your experience at our office within a few days to 1 week by e-mail or mail. We value your feedback.  [name], DO [facility]

## 2017-11-04 NOTE — Progress Notes (Signed)
Subjective:    Patient ID: Darlene Barber, female    DOB: Jul 30, 1965, 52 y.o.   MRN: [id number]  Darlene Barber is a 52 y.o. female presenting on 11/04/2017 for Cough (as per patient coughing hard making her nauseous, making her dizzy, seeing stars wake her up at night onset month)   HPI   SINUSITIS / CONGESTION / COUGH Reports persistent cough for past 1 month, non productive, she tried OTC meds and cough drops. She had sinus and nasal congestion and facial pressure and had headache and pain. Seemed to get better and then worse again now with persistent sinus symptoms - She had prior rx of Flonase, and had stopped this in the past. She tried Flonase for past 3 days. Limited. - No recent antibiotics Admits cough worse at night - Denies any fever, chills, dyspnea wheezing, hemoptysis, chest pain or pressure, swelling  Health Maintenance: Due for flu vaccine, not in stock will return  Depression screen [facility] 2/9 11/04/2017 05/26/2017 08/20/2016  Decreased Interest 0 1 2  Down, Depressed, Hopeless 0 0 1  PHQ - 2 Score 0 1 3  Altered sleeping - 3 2  Tired, decreased energy - 2 2  Change in appetite - 2 2  Feeling bad or failure about yourself  - 1 1  Trouble concentrating - 0 2  Moving slowly or fidgety/restless - 1 3  Suicidal thoughts - 0 2  PHQ-9 Score - 10 17  Difficult doing work/chores - Not difficult at all -    Social History   Tobacco Use  . Smoking status: Current Every Day Smoker    Packs/day: 1.00    Years: 30.00    Pack years: 30.00    Last attempt to quit: 01/2016    Years since quitting: 1.8  . Smokeless tobacco: Current User  Substance Use Topics  . Alcohol use: Yes    Comment: occ  . Drug use: Yes    Types: Marijuana    Comment: Current use    Review of Systems Per HPI unless specifically indicated above     Objective:    BP (!) 120/56   Pulse 99   Temp 98.9 F (37.2 C) (Oral)   Resp 16   Ht 5' (1.524 m)   Wt 219 lb (99.3 kg)   SpO2 99%    BMI 42.77 kg/m   Wt Readings from Last 3 Encounters:  11/04/17 219 lb (99.3 kg)  10/02/17 219 lb (99.3 kg)  07/21/17 221 lb 8 oz (100.5 kg)    Physical Exam  Constitutional: She is oriented to person, place, and time. She appears well-developed and well-nourished. No distress.  Well-appearing, comfortable, cooperative, obese  HENT:  Head: Normocephalic and atraumatic.  Mouth/Throat: Oropharynx is clear and moist.  Frontal sinuses mild tender. Nares with congestion and turbinate edema without purulence. Bilateral TMs clear with mild effusion only without erythema or bulging. Oropharynx clear without erythema, exudates, edema or asymmetry.  Eyes: Conjunctivae are normal. Right eye exhibits no discharge. Left eye exhibits no discharge.  Neck: Normal range of motion. Neck supple. No thyromegaly present.  Cardiovascular: Normal rate, regular rhythm, normal heart sounds and intact distal pulses.  No murmur heard. Pulmonary/Chest: Effort normal and breath sounds normal. No respiratory distress. She has no wheezes. She has no rales.  Good air movement. No coughing. Speaks full sentences.  Musculoskeletal: Normal range of motion. She exhibits no edema.  Using crutches  Lymphadenopathy:    She has no cervical  adenopathy.  Neurological: She is alert and oriented to person, place, and time.  Skin: Skin is warm and dry. No rash noted. She is not diaphoretic. No erythema.  Psychiatric: She has a normal mood and affect. Her behavior is normal.  Well groomed, good eye contact, normal speech and thoughts  Nursing note and vitals reviewed.  Results for orders placed or performed in visit on 10/02/17  Cytology - PAP  Result Value Ref Range   Adequacy Satisfactory for evaluation.    Diagnosis      NEGATIVE FOR INTRAEPITHELIAL LESIONS OR MALIGNANCY.   Material Submitted Vaginal Pap [ThinPrep Imaged]       Assessment & Plan:   Problem List Items Addressed This Visit    None    Visit Diagnoses     Acute non-recurrent frontal sinusitis    -  Primary   Relevant Medications   fluticasone (FLONASE) 50 MCG/ACT nasal spray   ipratropium (ATROVENT) 0.06 % nasal spray   amoxicillin-clavulanate (AUGMENTIN) 875-125 MG tablet   Seasonal allergic rhinitis due to other allergic trigger       Relevant Medications   fluticasone (FLONASE) 50 MCG/ACT nasal spray      Consistent with acute to subacute frontal rhinosinusitis, likely initially allergic rhinitis component with worsening concern for bacterial infection now second sickening >1 month.  Plan: 1. Start Augmentin 875-125mg  PO BID x 10 days 2. Start Atrovent nasal spray decongestant 2 sprays in each nostril up to 4 times daily for 7 days 3. Restart Flonase after 1 week, new rx sent 4. Continue Cetirizine 5. Supportive care with nasal saline OTC, hydration Return criteria reviewed   Meds ordered this encounter  Medications  . fluticasone (FLONASE) 50 MCG/ACT nasal spray    Sig: Place 1 spray into both nostrils daily.    Dispense:  16 g    Refill:  3  . ipratropium (ATROVENT) 0.06 % nasal spray    Sig: Place 2 sprays into both nostrils 4 (four) times daily. For up to 5-7 days then stop.    Dispense:  15 mL    Refill:  0  . amoxicillin-clavulanate (AUGMENTIN) 875-125 MG tablet    Sig: Take 1 tablet by mouth 2 (two) times daily.    Dispense:  20 tablet    Refill:  0    Follow up plan: Return in about 1 week (around 11/11/2017), or if symptoms worsen or fail to improve, for sinusitis.  Already scheduled for Annual Physical in 11/2017 has labs ordered.  [name], [facility] 11/04/2017, 1:57 PM

## 2017-11-12 ENCOUNTER — Encounter: Payer: Self-pay | Admitting: Obstetrics & Gynecology

## 2017-11-12 ENCOUNTER — Ambulatory Visit
Admission: RE | Admit: 2017-11-12 | Discharge: 2017-11-12 | Disposition: A | Discharge status: Home / Self Care | Payer: Medicaid Other | Source: Ambulatory Visit | Attending: Obstetrics & Gynecology | Admitting: Obstetrics & Gynecology

## 2017-11-12 DIAGNOSIS — Z1239 Encounter for other screening for malignant neoplasm of breast: Secondary | ICD-10-CM

## 2017-11-12 DIAGNOSIS — Z1231 Encounter for screening mammogram for malignant neoplasm of breast: Secondary | ICD-10-CM | POA: Diagnosis not present

## 2017-11-17 ENCOUNTER — Encounter: Payer: Self-pay | Admitting: Urology

## 2017-11-17 ENCOUNTER — Ambulatory Visit: Payer: Medicaid Other | Admitting: Urology

## 2017-11-17 VITALS — BP 139/92 | HR 89 | Ht 59.0 in | Wt 218.0 lb

## 2017-11-17 DIAGNOSIS — N3946 Mixed incontinence: Secondary | ICD-10-CM

## 2017-11-17 MED ORDER — CIPROFLOXACIN HCL 250 MG PO TABS: 250.0000 mg | ORAL_TABLET | Freq: Two times a day (BID) | ORAL | 0 refills | Status: DC

## 2017-11-17 NOTE — Progress Notes (Signed)
11/17/2017 2:59 PM   Darlene Barber January 11, 1966 [id number]  Referring provider: Olin Hauser, DO [address] [place], [place] [address]  Chief Complaint  Patient presents with  . Urinary Incontinence    8wk    HPI: Patient was consulted for worsening incontinence over 1 year especially since November. She leaks with coughing sneezing standing and bending. She has urge incontinence that is quite sudden. I think she has small volume bedwetting.She wears 3 pads a day that can be quite wet.  She voids every 2 or 3 hours and is no nocturia. Her flow is good.   Mild grade 2 hypermobility of the bladder neck with a negative cough test with a mild cough. Grade 1 cystocele and no rectocele  The patient has mixed incontinence and mild bedwetting. Both components seem to be quite significant.  On urodynamics the patient was catheterized for 100 mL. Bladder capacity was 622 mL. The bladder was unstable reaching a pressure of 5 cm of water felt as urgency but she did not leak. She did note that she leaks when she goes from a sitting to standing position and that her urges will hit her suddenly. She did not leak with a Valsalva pressure of 122 cm of water. During voluntary voiding she voided 575 mL. Maximal flow was 8 mils per second. Maximum voiding pressure is 38 semis water. She emptied efficiently. She was straining some. EMG activity increased during the voiding phase. Bladder neck descent at 1 or 2 cm.   The patient has mild stress incontinence and clinically an overactive bladder with urge incontinence and mild bedwetting. I will try to help her with medical and behavioral therapy. She may be a candidate to consider a sling in the future and I would want to re-quantitate her stress component. Ongoing OAB symptoms and bedwetting would need to be discussePelvic floor exercises and fluid modifications also started.   She did not respond to  Myrbetriq Patient leaks with coughing but not that much unless she has a bad cough with bronchitis.  She is trying to stop smoking.    Reassess in 2 months.  Detrol LA and oxybutynin ER 10 mg given. Re-quantitate goals and the role of a sling versus refractory therapy.  Today Patient has minimal stress incontinence unless she has bronchitis.  She still has urge incontinence with very little warning as her primary symptom.  Because of insurance we did not talk about percutaneous tibial nerve stimulation.  We talked about Botox and InterStim with usual templates.  She would like to try Botox first to try to avoid surgery.  She is a smoker and I will perform cystoscopy prior to the Botox to make certain her bladder is clear and this was discussed.  3 days ciprofloxacin prescription was given.  She has not had blood in the urine microscopically    PMH: Past Medical History:  Diagnosis Date  . Allergy   . Anxiety   . Depression   . Diabetes mellitus without complication ([place])   . Frequent headaches   . Glaucoma   . Hypertension   . Sleep apnea     Surgical History: Past Surgical History:  Procedure Laterality Date  . CESAREAN SECTION     x2  . CHOLECYSTECTOMY    . HYSTERECTOMY ABDOMINAL WITH SALPINGECTOMY  1998  . HYSTEROSCOPY      Home Medications:  Allergies as of 11/17/2017      Reactions   [facility] Other (See Comments)  Feels like throat is closing, itchy Feels like throat is closing, itchy Feels like throat is closing, itchy   Tape Rash      Medication List        Accurate as of 11/17/17  2:59 PM. Always use your most recent med list.          benztropine 0.5 MG tablet Commonly known as:  COGENTIN Take 0.5 mg by mouth 2 (two) times daily.   BYDUREON 2 MG Pen Generic drug:  Exenatide ER INJECT 2 MG INTO THE SKIN ONCE A WEEK.   cetirizine 10 MG tablet Commonly known as:  ZYRTEC Take 10 mg by mouth.   clobetasol cream 0.05 % Commonly known as:   TEMOVATE Frequency:BID   Dosage:0.0     Instructions:  Note:Dose: 0.05 %   cloNIDine 0.2 MG tablet Commonly known as:  CATAPRES Take 1 tablet (0.2 mg total) by mouth daily.   cyclobenzaprine 10 MG tablet Commonly known as:  FLEXERIL Take 10 mg by mouth 3 (three) times daily as needed for muscle spasms.   divalproex 250 MG DR tablet Commonly known as:  DEPAKOTE Take 250 mg by mouth 3 (three) times daily.   estradiol 1 MG tablet Commonly known as:  ESTRACE TAKE 1 TABLET BY MOUTH DAILY   etodolac 400 MG tablet Commonly known as:  LODINE Take 400 mg by mouth 2 (two) times daily.   fluticasone 50 MCG/ACT nasal spray Commonly known as:  FLONASE Place 1 spray into both nostrils daily.   ipratropium 0.06 % nasal spray Commonly known as:  ATROVENT Place 2 sprays into both nostrils 4 (four) times daily. For up to 5-7 days then stop.   lurasidone 80 MG Tabs tablet Commonly known as:  LATUDA Take 80 mg by mouth.   Melatonin 5 MG Caps Take by mouth.   mirabegron ER 50 MG Tb24 tablet Commonly known as:  MYRBETRIQ Take 1 tablet (50 mg total) by mouth daily.   nortriptyline 25 MG capsule Commonly known as:  PAMELOR Take 25 mg by mouth at bedtime.   omeprazole 40 MG capsule Commonly known as:  PRILOSEC TAKE 1 CAPSULE BY MOUTH ONCE DAILY. TAKE 30 MINUTES PRIOR TO BREAKFAST   oxybutynin 10 MG 24 hr tablet Commonly known as:  DITROPAN-XL Take 1 tablet (10 mg total) by mouth daily.   polyethylene glycol powder powder Commonly known as:  GLYCOLAX/MIRALAX TAKE 17GM (DISSOLVED IN WATER) BY MOUTH TWICE A DAY FOR CONSTIPATION   pregabalin 150 MG capsule Commonly known as:  LYRICA Take by mouth.   promethazine 25 MG tablet Commonly known as:  PHENERGAN TAKE 1 TABLET BY MOUTH EVERY SIX HOURS AS NEEDED FOR NAUSEA VOMITING   propranolol ER 120 MG 24 hr capsule Commonly known as:  INDERAL LA TAKE 1 CAPSULE (120 MG TOTAL) BY MOUTH DAILY.   PROVENTIL HFA 108 (90 Base) MCG/ACT  inhaler Generic drug:  albuterol INHALE 2 PUFFS INTO THE LUNGS EVERY 4 HOURS AS NEEDED FOR WHEEZING OR SHORTNESS OF BREATH   ranitidine 150 MG capsule Commonly known as:  ZANTAC Take 150 mg by mouth.   tolterodine 4 MG 24 hr capsule Commonly known as:  DETROL LA Take 1 capsule (4 mg total) by mouth daily.   valACYclovir 1000 MG tablet Commonly known as:  VALTREX TAKE 1 TABLET BY MOUTH DAILY   vitamin B-12 100 MCG tablet Commonly known as:  CYANOCOBALAMIN Take 100 mcg by mouth daily.   vitamin E 100 UNIT capsule Take by  mouth daily.       Allergies:  Allergies  Allergen Reactions  . [facility] Other (See Comments)    Feels like throat is closing, itchy Feels like throat is closing, itchy Feels like throat is closing, itchy  . Tape Rash    Family History: Family History  Problem Relation Age of Onset  . Heart failure Maternal Grandmother   . Alzheimer's disease Maternal Grandfather   . Emphysema Paternal Grandmother   . Hodgkin's lymphoma Mother   . Diabetes Maternal Aunt   . Diabetes Maternal Uncle   . Cancer Other   . Breast cancer Neg Hx     Social History:  reports that she has been smoking. She has a 30.00 pack-year smoking history. She uses smokeless tobacco. She reports that she drinks alcohol. She reports that she has current or past drug history. Drug: Marijuana.  ROS: UROLOGY Frequent Urination?: Yes Hard to postpone urination?: No Burning/pain with urination?: No Get up at night to urinate?: No Leakage of urine?: No Urine stream starts and stops?: No Trouble starting stream?: No Do you have to strain to urinate?: No Blood in urine?: No Urinary tract infection?: No Sexually transmitted disease?: No Injury to kidneys or bladder?: No Painful intercourse?: No Weak stream?: No Currently pregnant?: No Vaginal bleeding?: No Last menstrual period?: n  Gastrointestinal Nausea?: No Vomiting?: No Indigestion/heartburn?: No Diarrhea?:  No Constipation?: No  Constitutional Fever: No Night sweats?: No Weight loss?: No Fatigue?: No  Skin Skin rash/lesions?: No Itching?: No  Eyes Blurred vision?: Yes Double vision?: Yes  Ears/Nose/Throat Sore throat?: No Sinus problems?: No  Hematologic/Lymphatic Swollen glands?: Yes Easy bruising?: Yes  Cardiovascular Leg swelling?: No Chest pain?: No  Respiratory Cough?: No Shortness of breath?: No  Endocrine Excessive thirst?: No  Musculoskeletal Back pain?: Yes Joint pain?: Yes  Neurological Headaches?: No Dizziness?: No  Psychologic Depression?: Yes Anxiety?: Yes  Physical Exam: BP (!) 139/92   Pulse 89   Ht 4\' 11"  (1.499 m)   Wt 98.9 kg   BMI 44.03 kg/m   Constitutional:  Alert and oriented, No acute distress.  Laboratory Data: Lab Results  Component Value Date   WBC 9.0 11/12/2016   HGB 14.9 11/12/2016   HCT 45.6 (H) 11/12/2016   MCV 100.4 (H) 11/12/2016   PLT 310 11/12/2016    Lab Results  Component Value Date   CREATININE 0.71 11/12/2016    No results found for: PSA  No results found for: TESTOSTERONE  Lab Results  Component Value Date   HGBA1C 7.0 (A) 05/26/2017    Urinalysis    Component Value Date/Time   COLORURINE Yellow 04/20/2012 1725   APPEARANCEUR Cloudy (A) 07/21/2017 1043   LABSPEC 1.010 04/20/2012 1725   PHURINE 6.0 04/20/2012 1725   GLUCOSEU Negative 07/21/2017 1043   GLUCOSEU Negative 04/20/2012 1725   HGBUR Negative 04/20/2012 1725   BILIRUBINUR Positive (A) 07/21/2017 1043   BILIRUBINUR Negative 04/20/2012 1725   KETONESUR Negative 04/20/2012 1725   PROTEINUR Trace (A) 07/21/2017 1043   PROTEINUR Negative 04/20/2012 1725   NITRITE Negative 07/21/2017 1043   NITRITE Negative 04/20/2012 1725   LEUKOCYTESUR Negative 07/21/2017 1043   LEUKOCYTESUR Negative 04/20/2012 1725    Pertinent Imaging:   Assessment & Plan: Prescription for 3 days given.  Call and arrange Botox.  Perform cystoscopy  prior  There are no diagnoses linked to this encounter.  No follow-ups on file.  Reece Packer, MD  [facility] [address], [place],  [place] [address] [phone]

## 2017-11-19 ENCOUNTER — Other Ambulatory Visit: Payer: Medicaid Other

## 2017-11-19 DIAGNOSIS — Z6841 Body Mass Index (BMI) 40.0 and over, adult: Secondary | ICD-10-CM

## 2017-11-19 DIAGNOSIS — Z0000 Encounter for general adult medical examination without abnormal findings: Secondary | ICD-10-CM | POA: Diagnosis not present

## 2017-11-19 DIAGNOSIS — F3175 Bipolar disorder, in partial remission, most recent episode depressed: Secondary | ICD-10-CM | POA: Diagnosis not present

## 2017-11-19 DIAGNOSIS — I10 Essential (primary) hypertension: Secondary | ICD-10-CM

## 2017-11-19 DIAGNOSIS — R718 Other abnormality of red blood cells: Secondary | ICD-10-CM

## 2017-11-19 DIAGNOSIS — E118 Type 2 diabetes mellitus with unspecified complications: Secondary | ICD-10-CM

## 2017-11-19 DIAGNOSIS — R635 Abnormal weight gain: Secondary | ICD-10-CM | POA: Diagnosis not present

## 2017-11-19 DIAGNOSIS — Z79899 Other long term (current) drug therapy: Secondary | ICD-10-CM

## 2017-11-20 ENCOUNTER — Encounter
Admission: RE | Disposition: A | Discharge status: Home / Self Care | Payer: Self-pay | Source: Ambulatory Visit | Attending: Gastroenterology

## 2017-11-20 ENCOUNTER — Encounter: Payer: Self-pay | Admitting: Family Medicine

## 2017-11-20 ENCOUNTER — Ambulatory Visit: Payer: Medicaid Other | Admitting: Certified Registered Nurse Anesthetist

## 2017-11-20 ENCOUNTER — Ambulatory Visit
Admission: RE | Admit: 2017-11-20 | Discharge: 2017-11-20 | Disposition: A | Discharge status: Home / Self Care | Payer: Medicaid Other | Source: Ambulatory Visit | Attending: Gastroenterology | Admitting: Gastroenterology

## 2017-11-20 ENCOUNTER — Other Ambulatory Visit: Payer: Self-pay

## 2017-11-20 DIAGNOSIS — Z8249 Family history of ischemic heart disease and other diseases of the circulatory system: Secondary | ICD-10-CM | POA: Diagnosis not present

## 2017-11-20 DIAGNOSIS — Z1211 Encounter for screening for malignant neoplasm of colon: Secondary | ICD-10-CM

## 2017-11-20 DIAGNOSIS — Z79899 Other long term (current) drug therapy: Secondary | ICD-10-CM | POA: Diagnosis not present

## 2017-11-20 DIAGNOSIS — I10 Essential (primary) hypertension: Secondary | ICD-10-CM | POA: Insufficient documentation

## 2017-11-20 DIAGNOSIS — F418 Other specified anxiety disorders: Secondary | ICD-10-CM | POA: Diagnosis not present

## 2017-11-20 DIAGNOSIS — Z888 Allergy status to other drugs, medicaments and biological substances status: Secondary | ICD-10-CM | POA: Diagnosis not present

## 2017-11-20 DIAGNOSIS — Z539 Procedure and treatment not carried out, unspecified reason: Secondary | ICD-10-CM | POA: Diagnosis not present

## 2017-11-20 DIAGNOSIS — E119 Type 2 diabetes mellitus without complications: Secondary | ICD-10-CM | POA: Insufficient documentation

## 2017-11-20 DIAGNOSIS — K219 Gastro-esophageal reflux disease without esophagitis: Secondary | ICD-10-CM | POA: Diagnosis not present

## 2017-11-20 DIAGNOSIS — Z538 Procedure and treatment not carried out for other reasons: Secondary | ICD-10-CM | POA: Diagnosis not present

## 2017-11-20 DIAGNOSIS — F17210 Nicotine dependence, cigarettes, uncomplicated: Secondary | ICD-10-CM | POA: Diagnosis not present

## 2017-11-20 DIAGNOSIS — Z6841 Body Mass Index (BMI) 40.0 and over, adult: Secondary | ICD-10-CM | POA: Diagnosis not present

## 2017-11-20 DIAGNOSIS — G4730 Sleep apnea, unspecified: Secondary | ICD-10-CM | POA: Insufficient documentation

## 2017-11-20 DIAGNOSIS — E1169 Type 2 diabetes mellitus with other specified complication: Secondary | ICD-10-CM | POA: Insufficient documentation

## 2017-11-20 DIAGNOSIS — F419 Anxiety disorder, unspecified: Secondary | ICD-10-CM | POA: Insufficient documentation

## 2017-11-20 DIAGNOSIS — K644 Residual hemorrhoidal skin tags: Secondary | ICD-10-CM | POA: Diagnosis not present

## 2017-11-20 DIAGNOSIS — F319 Bipolar disorder, unspecified: Secondary | ICD-10-CM | POA: Insufficient documentation

## 2017-11-20 DIAGNOSIS — E785 Hyperlipidemia, unspecified: Secondary | ICD-10-CM

## 2017-11-20 HISTORY — PX: COLONOSCOPY WITH PROPOFOL: SHX5780

## 2017-11-20 LAB — URINE DRUG SCREEN, QUALITATIVE (ARMC ONLY)
Amphetamines, Ur Screen: NOT DETECTED
BARBITURATES, UR SCREEN: NOT DETECTED
BENZODIAZEPINE, UR SCRN: NOT DETECTED
CANNABINOID 50 NG, UR ~~LOC~~: POSITIVE — AB
Cocaine Metabolite,Ur ~~LOC~~: NOT DETECTED
MDMA (Ecstasy)Ur Screen: NOT DETECTED
Methadone Scn, Ur: NOT DETECTED
OPIATE, UR SCREEN: NOT DETECTED
PHENCYCLIDINE (PCP) UR S: NOT DETECTED
Tricyclic, Ur Screen: POSITIVE — AB

## 2017-11-20 LAB — HEMOGLOBIN A1C
Hgb A1c MFr Bld: 6.4 % of total Hgb — ABNORMAL HIGH (ref <5.7)
MEAN PLASMA GLUCOSE: 137 (calc)
eAG (mmol/L): 7.6 (calc)

## 2017-11-20 LAB — COMPLETE METABOLIC PANEL WITH GFR
AG Ratio: 1.4 (calc) (ref 1.0–2.5)
ALT: 11 U/L (ref 6–29)
AST: 13 U/L (ref 10–35)
Albumin: 3.5 g/dL — ABNORMAL LOW (ref 3.6–5.1)
Alkaline phosphatase (APISO): 72 U/L (ref 33–130)
BUN: 16 mg/dL (ref 7–25)
CALCIUM: 9.1 mg/dL (ref 8.6–10.4)
CO2: 26 mmol/L (ref 20–32)
Chloride: 104 mmol/L (ref 98–110)
Creat: 0.69 mg/dL (ref 0.50–1.05)
GFR, EST NON AFRICAN AMERICAN: 101 mL/min/{1.73_m2} (ref >=60)
GFR, Est African American: 117 mL/min/{1.73_m2} (ref >=60)
GLUCOSE: 148 mg/dL — AB (ref 65–99)
Globulin: 2.5 g/dL (calc) (ref 1.9–3.7)
Potassium: 4 mmol/L (ref 3.5–5.3)
Sodium: 140 mmol/L (ref 135–146)
Total Bilirubin: 0.4 mg/dL (ref 0.2–1.2)
Total Protein: 6 g/dL — ABNORMAL LOW (ref 6.1–8.1)

## 2017-11-20 LAB — CBC WITH DIFFERENTIAL/PLATELET
BASOS ABS: 103 {cells}/uL (ref 0–200)
BASOS PCT: 1.1 %
EOS ABS: 291 {cells}/uL (ref 15–500)
EOS PCT: 3.1 %
HEMATOCRIT: 41.3 % (ref 35.0–45.0)
HEMOGLOBIN: 14.4 g/dL (ref 11.7–15.5)
LYMPHS ABS: 3581 {cells}/uL (ref 850–3900)
MCH: 33.5 pg — ABNORMAL HIGH (ref 27.0–33.0)
MCHC: 34.9 g/dL (ref 32.0–36.0)
MCV: 96 fL (ref 80.0–100.0)
MPV: 12 fL (ref 7.5–12.5)
Monocytes Relative: 7.9 %
NEUTROS ABS: 4681 {cells}/uL (ref 1500–7800)
Neutrophils Relative %: 49.8 %
Platelets: 306 10*3/uL (ref 140–400)
RBC: 4.3 10*6/uL (ref 3.80–5.10)
RDW: 13.2 % (ref 11.0–15.0)
Total Lymphocyte: 38.1 %
WBC mixed population: 743 cells/uL (ref 200–950)
WBC: 9.4 10*3/uL (ref 3.8–10.8)

## 2017-11-20 LAB — LIPID PANEL
Cholesterol: 169 mg/dL (ref <200)
HDL: 40 mg/dL — ABNORMAL LOW (ref >50)
LDL Cholesterol (Calc): 92 mg/dL (calc)
NON-HDL CHOLESTEROL (CALC): 129 mg/dL (ref <130)
Total CHOL/HDL Ratio: 4.2 (calc) (ref <5.0)
Triglycerides: 282 mg/dL — ABNORMAL HIGH (ref <150)

## 2017-11-20 LAB — TSH: TSH: 1.97 mIU/L

## 2017-11-20 LAB — GLUCOSE, CAPILLARY: Glucose-Capillary: 113 mg/dL — ABNORMAL HIGH (ref 70–99)

## 2017-11-20 LAB — T4, FREE: FREE T4: 1.2 ng/dL (ref 0.8–1.8)

## 2017-11-20 LAB — FOLATE: FOLATE: 8.8 ng/mL

## 2017-11-20 LAB — VITAMIN B12: VITAMIN B 12: 430 pg/mL (ref 200–1100)

## 2017-11-20 SURGERY — COLONOSCOPY WITH PROPOFOL
Anesthesia: General

## 2017-11-20 MED ORDER — MIDAZOLAM HCL 2 MG/2ML IJ SOLN
INTRAMUSCULAR | Status: DC | PRN
  Administered 2017-11-20: 2 mg via INTRAVENOUS

## 2017-11-20 MED ORDER — MIDAZOLAM HCL 2 MG/2ML IJ SOLN
INTRAMUSCULAR | Status: AC
  Filled 2017-11-20: qty 2

## 2017-11-20 MED ORDER — PROPOFOL 10 MG/ML IV BOLUS
INTRAVENOUS | Status: DC | PRN
  Administered 2017-11-20: 100 mg via INTRAVENOUS

## 2017-11-20 MED ORDER — SODIUM CHLORIDE 0.9 % IV SOLN
INTRAVENOUS | Status: DC
  Administered 2017-11-20: 09:00:00 via INTRAVENOUS

## 2017-11-20 MED ORDER — PROPOFOL 500 MG/50ML IV EMUL
INTRAVENOUS | Status: AC
  Filled 2017-11-20: qty 50

## 2017-11-20 MED ORDER — LIDOCAINE HCL (PF) 2 % IJ SOLN
INTRAMUSCULAR | Status: AC
  Filled 2017-11-20: qty 10

## 2017-11-20 MED ORDER — LIDOCAINE HCL (CARDIAC) PF 100 MG/5ML IV SOSY
PREFILLED_SYRINGE | INTRAVENOUS | Status: DC | PRN
  Administered 2017-11-20: 50 mg via INTRAVENOUS

## 2017-11-20 MED ORDER — PROPOFOL 500 MG/50ML IV EMUL
INTRAVENOUS | Status: DC | PRN
  Administered 2017-11-20: 140 ug/kg/min via INTRAVENOUS

## 2017-11-20 NOTE — Anesthesia Postprocedure Evaluation (Signed)
Anesthesia Post Note  Patient: Darlene Barber  Procedure(s) Performed: COLONOSCOPY WITH PROPOFOL (N/A )  Patient location during evaluation: Endoscopy Anesthesia Type: General Level of consciousness: awake and alert Pain management: pain level controlled Vital Signs Assessment: post-procedure vital signs reviewed and stable Respiratory status: spontaneous breathing, nonlabored ventilation, respiratory function stable and patient connected to nasal cannula oxygen Cardiovascular status: blood pressure returned to baseline and stable Postop Assessment: no apparent nausea or vomiting Anesthetic complications: no     Last Vitals:  Vitals:   11/20/17 1108 11/20/17 1118  BP: (!) 145/91 (!) 156/91  Pulse: 83 82  Resp: 16 16  Temp:    SpO2: 100% 99%    Last Pain:  Vitals:   11/20/17 1108  TempSrc:   PainSc: 6                  Martha Clan

## 2017-11-20 NOTE — OR Nursing (Signed)
Dr. Marius Ditch informed that pt reports she eat dinner last night at 1830, crackers at 2130 and a few sips of ginger ale this am.  Pt reports her last stool was clar.

## 2017-11-20 NOTE — Anesthesia Preprocedure Evaluation (Signed)
Anesthesia Evaluation  Patient identified by MRN, date of birth, ID band Patient awake    Reviewed: Allergy & Precautions, H&P , NPO status , [name] Chart, lab work & pertinent test results, reviewed documented beta blocker date and time   History of Anesthesia Complications Negative for: history of anesthetic complications  Airway Mallampati: I  TM Distance: >3 FB Neck ROM: full    Dental  (+) Upper Dentures, Dental Advidsory Given, Missing   Pulmonary neg shortness of breath, sleep apnea , neg COPD, neg recent URI, Current Smoker,           Cardiovascular Exercise Tolerance: Good hypertension, (-) angina(-) CAD, (-) Past MI, (-) Cardiac Stents and (-) CABG (-) dysrhythmias (-) Valvular Problems/Murmurs     Neuro/Psych PSYCHIATRIC DISORDERS Anxiety Depression Bipolar Disorder negative neurological ROS     GI/Hepatic Neg liver ROS, GERD  ,  Endo/Other  diabetesMorbid obesity  Renal/GU negative Renal ROS  negative genitourinary   Musculoskeletal   Abdominal   Peds  Hematology negative hematology ROS (+)   Anesthesia Other Findings Past Medical History: No date: Allergy No date: Anxiety No date: Depression No date: Diabetes mellitus without complication ([facility]) No date: Frequent headaches No date: Hypertension No date: Sleep apnea     Comment:  doesn't use CPAP machine broken,    Reproductive/Obstetrics negative OB ROS                             Anesthesia Physical Anesthesia Plan  ASA: III  Anesthesia Plan: General   Post-op Pain Management:    Induction: Intravenous  PONV Risk Score and Plan: 2 and Propofol infusion and TIVA  Airway Management Planned: Nasal Cannula and Natural Airway  Additional Equipment:   Intra-op Plan:   Post-operative Plan:   Informed Consent: I have reviewed the patients History and Physical, chart, labs and discussed the procedure including  the risks, benefits and alternatives for the proposed anesthesia with the patient or authorized representative who has indicated his/her understanding and acceptance.   Dental Advisory Given  Plan Discussed with: Anesthesiologist, CRNA and Surgeon  Anesthesia Plan Comments:         Anesthesia Quick Evaluation

## 2017-11-20 NOTE — Op Note (Signed)
[facility] Patient Name: Darlene Barber Procedure Date: 11/20/2017 10:22 AM MRN: [id number] Account #: [id number] Date of Birth: [birth year] Admit Type: Outpatient Age: 52 Room: [facility] ENDO ROOM 2 Gender: Female Note Status: Finalized Procedure:            Colonoscopy Indications:          Screening for colorectal malignant neoplasm, Last                        colonoscopy: April 2011 Providers:            Lin Landsman MD, MD Medicines:            Monitored Anesthesia Care Complications:        No immediate complications. Estimated blood loss: None. Procedure:            Pre-Anesthesia Assessment:                       - Prior to the procedure, a History and Physical was                        performed, and patient medications and allergies were                        reviewed. The patient is competent. The risks and                        benefits of the procedure and the sedation options and                        risks were discussed with the patient. All questions                        were answered and informed consent was obtained.                        Patient identification and proposed procedure were                        verified by the physician, the nurse, the                        anesthesiologist, the anesthetist and the technician in                        the pre-procedure area in the procedure room in the                        endoscopy suite. Mental Status Examination: alert and                        oriented. Airway Examination: normal oropharyngeal                        airway and neck mobility. Respiratory Examination:                        clear to auscultation. CV Examination: normal.  Prophylactic Antibiotics: The patient does not require                        prophylactic antibiotics. Prior Anticoagulants: The                        patient has taken no previous anticoagulant or                  antiplatelet agents. ASA Grade Assessment: III - A                        patient with severe systemic disease. After reviewing                        the risks and benefits, the patient was deemed in                        satisfactory condition to undergo the procedure. The                        anesthesia plan was to use monitored anesthesia care                        (MAC). Immediately prior to administration of                        medications, the patient was re-assessed for adequacy                        to receive sedatives. The heart rate, respiratory rate,                        oxygen saturations, blood pressure, adequacy of                        pulmonary ventilation, and response to care were                        monitored throughout the procedure. The physical status                        of the patient was re-assessed after the procedure.                       After obtaining informed consent, the colonoscope was                        passed under direct vision. Throughout the procedure,                        the patient's blood pressure, pulse, and oxygen                        saturations were monitored continuously. The                        Colonoscope was introduced through the anus and                        advanced to the the transverse colon. The  colonoscopy                        was performed with difficulty due to poor bowel prep                        with stool present. Successful completion of the                        procedure was aided by procedure aborted. The patient                        tolerated the procedure well. The quality of the bowel                        preparation was poor. Findings:      Skin tags were found on perianal exam.      Copious quantities of semi-liquid stool was found in the entire examined       colon, interfering with visualization. Procedure aborted and scope       withdrawn      Scope advanced to  transverse colon      The retroflexed view of the distal rectum and anal verge was normal and       showed no anal or rectal abnormalities. Impression:           - Preparation of the colon was poor.                       - Perianal skin tags found on perianal exam.                       - Stool in the entire examined colon.                       - The distal rectum and anal verge are normal on                        retroflexion view.                       - No specimens collected. Recommendation:       - Discharge patient to home (with escort).                       - Clear liquid diet today.                       - Continue present medications.                       - Repeat colonoscopy tomorrow because the bowel                        preparation was poor if agreeable by the patient.                        Otheriwse, recommend 2 day bowel prep                       - Repeat bowel prep Procedure Code(s):    --- Professional ---  G0121, 62, Colorectal cancer screening; colonoscopy on                        individual not meeting criteria for high risk Diagnosis Code(s):    --- Professional ---                       Z12.11, Encounter for screening for malignant neoplasm                        of colon                       K64.4, Residual hemorrhoidal skin tags CPT copyright 2017 [employer]. All rights reserved. The codes documented in this report are preliminary and upon coder review may  be revised to meet current compliance requirements. Dr. Ulyess Mort Lin Landsman MD, MD 11/20/2017 10:46:49 AM This report has been signed electronically. Number of Addenda: 0 Note Initiated On: 11/20/2017 10:22 AM Total Procedure Duration: 0 hours 6 minutes 1 second       [facility]

## 2017-11-20 NOTE — Anesthesia Post-op Follow-up Note (Signed)
Anesthesia QCDR form completed.        

## 2017-11-20 NOTE — H&P (Signed)
Cephas Darby, MD [address]  [facility]  [place], [place] [address]  Main: [phone]  Fax: [phone] Pager: [phone]  Primary Care Physician:  Olin Hauser, DO Primary Gastroenterologist:  Dr. Cephas Darby  Pre-Procedure History & Physical: HPI:  Darlene Barber is a 52 y.o. female is here for an colonoscopy.   Past Medical History:  Diagnosis Date  . Allergy   . Anxiety   . Depression   . Diabetes mellitus without complication ([place])   . Frequent headaches   . Hypertension   . Sleep apnea    doesn't use CPAP machine broken,     Past Surgical History:  Procedure Laterality Date  . CESAREAN SECTION     x2  . CHOLECYSTECTOMY    . HYSTERECTOMY ABDOMINAL WITH SALPINGECTOMY  1998  . HYSTEROSCOPY      Prior to Admission medications   Medication Sig Start Date End Date Taking? Authorizing Provider  benztropine (COGENTIN) 0.5 MG tablet Take 0.5 mg by mouth 2 (two) times daily.   Yes [provider]  cetirizine (ZYRTEC) 10 MG tablet Take 10 mg by mouth.   Yes [provider]  clobetasol cream (TEMOVATE) 0.05 % Frequency:BID   Dosage:0.0     Instructions:  Note:Dose: 0.05 % 11/27/11  Yes [provider]  cyclobenzaprine (FLEXERIL) 10 MG tablet Take 10 mg by mouth 3 (three) times daily as needed for muscle spasms.   Yes [provider]  divalproex (DEPAKOTE) 250 MG DR tablet Take 250 mg by mouth 3 (three) times daily.   Yes [provider]  estradiol (ESTRACE) 1 MG tablet TAKE 1 TABLET BY MOUTH DAILY 10/09/17  Yes Gae Dry, MD  etodolac (LODINE) 400 MG tablet Take 400 mg by mouth 2 (two) times daily.   Yes [provider]  fluticasone (FLONASE) 50 MCG/ACT nasal spray Place 1 spray into both nostrils daily. 11/04/17  Yes Karamalegos, Devonne Doughty, DO  lurasidone (LATUDA) 80 MG TABS tablet Take 80 mg by mouth. 11/27/11  Yes [provider]  Melatonin 5 MG CAPS Take by mouth.   Yes  [provider]  mirabegron ER (MYRBETRIQ) 50 MG TB24 tablet Take 1 tablet (50 mg total) by mouth daily. 06/09/17  Yes MacDiarmid, Nicki Reaper, MD  nortriptyline (PAMELOR) 25 MG capsule Take 25 mg by mouth at bedtime.   Yes [provider]  omeprazole (PRILOSEC) 40 MG capsule TAKE 1 CAPSULE BY MOUTH ONCE DAILY. TAKE 30 MINUTES PRIOR TO BREAKFAST 07/02/17  Yes Karamalegos, Devonne Doughty, DO  polyethylene glycol powder (GLYCOLAX/MIRALAX) powder TAKE 17GM (DISSOLVED IN WATER) BY MOUTH TWICE A DAY FOR CONSTIPATION 01/28/17  Yes Karamalegos, Alexander J, DO  promethazine (PHENERGAN) 25 MG tablet TAKE 1 TABLET BY MOUTH EVERY SIX HOURS AS NEEDED FOR NAUSEA VOMITING 08/26/17  Yes Karamalegos, Devonne Doughty, DO  propranolol ER (INDERAL LA) 120 MG 24 hr capsule TAKE 1 CAPSULE (120 MG TOTAL) BY MOUTH DAILY. 08/13/17  Yes Karamalegos, Alexander J, DO  PROVENTIL HFA 108 (90 Base) MCG/ACT inhaler INHALE 2 PUFFS INTO THE LUNGS EVERY 4 HOURS AS NEEDED FOR WHEEZING OR SHORTNESS OF BREATH 03/26/17  Yes Karamalegos, Devonne Doughty, DO  ranitidine (ZANTAC) 150 MG capsule Take 150 mg by mouth. 11/27/11  Yes [provider]  valACYclovir (VALTREX) 1000 MG tablet TAKE 1 TABLET BY MOUTH DAILY 10/09/17  Yes Karamalegos, Alexander J, DO  BYDUREON 2 MG PEN INJECT 2 MG INTO THE SKIN ONCE A WEEK. 10/27/17   Karamalegos,  Devonne Doughty, DO  ciprofloxacin (CIPRO) 250 MG tablet Take 1 tablet (250 mg total) by mouth 2 (two) times daily. Start 1 day prior to Botox, day of Botox and day after Patient not taking: Reported on 11/20/2017 11/17/17   Bjorn Loser, MD  cloNIDine (CATAPRES) 0.2 MG tablet Take 1 tablet (0.2 mg total) by mouth daily. Patient not taking: Reported on 11/20/2017 10/02/17   Gae Dry, MD  ipratropium (ATROVENT) 0.06 % nasal spray Place 2 sprays into both nostrils 4 (four) times daily. For up to 5-7 days then stop. Patient not taking: Reported on 11/20/2017 11/04/17   Olin Hauser, DO  oxybutynin  (DITROPAN-XL) 10 MG 24 hr tablet Take 1 tablet (10 mg total) by mouth daily. Patient not taking: Reported on 11/20/2017 07/21/17   Bjorn Loser, MD  pregabalin (LYRICA) 150 MG capsule Take by mouth.    [provider]  tolterodine (DETROL LA) 4 MG 24 hr capsule Take 1 capsule (4 mg total) by mouth daily. Patient not taking: Reported on 11/20/2017 07/21/17   Bjorn Loser, MD  vitamin B-12 (CYANOCOBALAMIN) 100 MCG tablet Take 100 mcg by mouth daily.    [provider]  vitamin E 100 UNIT capsule Take by mouth daily.    [provider]    Allergies as of 11/04/2017 - Review Complete 11/04/2017  Allergen Reaction Noted  . [place] Other (See Comments) 11/30/2013  . Tape Rash 01/31/2014    Family History  Problem Relation Age of Onset  . Heart failure Maternal Grandmother   . Alzheimer's disease Maternal Grandfather   . Emphysema Paternal Grandmother   . Hodgkin's lymphoma Mother   . Diabetes Maternal Aunt   . Diabetes Maternal Uncle   . Cancer Other   . Breast cancer Neg Hx     Social History   Socioeconomic History  . Marital status: Divorced    Spouse name: Not on file  . Number of children: Not on file  . Years of education: [employer]  . Highest education level: Not on file  Occupational History  . Occupation: Unemployed  Social Needs  . Financial resource strain: Not on file  . Food insecurity:    Worry: Not on file    Inability: Not on file  . Transportation needs:    Medical: Not on file    Non-medical: Not on file  Tobacco Use  . Smoking status: Current Every Day Smoker    Packs/day: 0.25    Years: 30.00    Pack years: 7.50    Last attempt to quit: 01/2016    Years since quitting: 1.8  . Smokeless tobacco: Never Used  Substance and Sexual Activity  . Alcohol use: Yes    Comment: occ once per year  . Drug use: Yes    Types: Marijuana    Comment: Current use  . Sexual activity: Not Currently  Lifestyle  . Physical activity:     Days per week: Not on file    Minutes per session: Not on file  . Stress: Not on file  Relationships  . Social connections:    Talks on phone: Not on file    Gets together: Not on file    Attends religious service: Not on file    Active member of club or organization: Not on file    Attends meetings of clubs or organizations: Not on file    Relationship status: Not on file  . Intimate partner violence:    Fear of  current or ex partner: Not on file    Emotionally abused: Not on file    Physically abused: Not on file    Forced sexual activity: Not on file  Other Topics Concern  . Not on file  Social History Narrative  . Not on file    Review of Systems: See HPI, otherwise negative ROS  Physical Exam: BP 108/79   Pulse 85   Temp (!) 97.2 F (36.2 C) (Tympanic)   Resp 20   Ht 4\' 11"  (1.499 m)   Wt 99.3 kg   SpO2 97%   BMI 44.23 kg/m  General:   Alert,  pleasant and cooperative in NAD Head:  Normocephalic and atraumatic. Neck:  Supple; no masses or thyromegaly. Lungs:  Clear throughout to auscultation.    Heart:  Regular rate and rhythm. Abdomen:  Soft, nontender and nondistended. Normal bowel sounds, without guarding, and without rebound.   Neurologic:  Alert and  oriented x4;  grossly normal neurologically.  Impression/Plan: Darlene Barber is here for an colonoscopy to be performed for colon cancer screening  Risks, benefits, limitations, and alternatives regarding  colonoscopy have been reviewed with the patient.  Questions have been answered.  All parties agreeable.   Sherri Sear, MD  11/20/2017, 8:43 AM

## 2017-11-20 NOTE — Transfer of Care (Signed)
Immediate Anesthesia Transfer of Care Note  Patient: Darlene Barber  Procedure(s) Performed: COLONOSCOPY WITH PROPOFOL (N/A )  Patient Location: PACU and Endoscopy Unit  Anesthesia Type:General  Level of Consciousness: awake, alert , oriented and patient cooperative  Airway & Oxygen Therapy: Patient Spontanous Breathing and Patient connected to nasal cannula oxygen  Post-op Assessment: Report given to RN and Post -op Vital signs reviewed and stable  Post vital signs: Reviewed and stable  Last Vitals:  Vitals Value Taken Time  BP    Temp    Pulse    Resp    SpO2      Last Pain:  Vitals:   11/20/17 0830  TempSrc: Tympanic  PainSc: 6          Complications: No apparent anesthesia complications

## 2017-11-24 ENCOUNTER — Encounter: Payer: Self-pay | Admitting: Gastroenterology

## 2017-11-26 ENCOUNTER — Encounter: Payer: Self-pay | Admitting: Family Medicine

## 2017-11-26 ENCOUNTER — Ambulatory Visit (INDEPENDENT_AMBULATORY_CARE_PROVIDER_SITE_OTHER): Payer: Medicaid Other | Admitting: Family Medicine

## 2017-11-26 VITALS — BP 134/78 | HR 84 | Temp 98.2°F | Resp 16 | Ht 60.0 in | Wt 217.0 lb

## 2017-11-26 DIAGNOSIS — Z23 Encounter for immunization: Secondary | ICD-10-CM

## 2017-11-26 DIAGNOSIS — I10 Essential (primary) hypertension: Secondary | ICD-10-CM

## 2017-11-26 DIAGNOSIS — E118 Type 2 diabetes mellitus with unspecified complications: Secondary | ICD-10-CM | POA: Diagnosis not present

## 2017-11-26 DIAGNOSIS — J410 Simple chronic bronchitis: Secondary | ICD-10-CM

## 2017-11-26 DIAGNOSIS — Z6841 Body Mass Index (BMI) 40.0 and over, adult: Secondary | ICD-10-CM

## 2017-11-26 DIAGNOSIS — Z0000 Encounter for general adult medical examination without abnormal findings: Secondary | ICD-10-CM

## 2017-11-26 DIAGNOSIS — E1169 Type 2 diabetes mellitus with other specified complication: Secondary | ICD-10-CM

## 2017-11-26 DIAGNOSIS — E785 Hyperlipidemia, unspecified: Secondary | ICD-10-CM | POA: Diagnosis not present

## 2017-11-26 DIAGNOSIS — Z720 Tobacco use: Secondary | ICD-10-CM | POA: Diagnosis not present

## 2017-11-26 LAB — POCT UA - MICROALBUMIN: MICROALBUMIN (UR) POC: 20 mg/L

## 2017-11-26 MED ORDER — NICOTINE 21-14-7 MG/24HR TD KIT: PACK | TRANSDERMAL | 1 refills | Status: DC

## 2017-11-26 NOTE — Patient Instructions (Addendum)
Thank you for coming to the office today.  Flu shot today and Pneumonia vaccine - good until age 52 for next pneumonia vaccine  A1c 6.4, significantly improved.  Triglycerides elevated, but other cholesterol is normal. Start Fish Oil Omega 3 - 1000mg  capsules twice daily with meals  Call Quitline to discuss with a therapist and tips and how to help quit smoking and get through withdrawal - They can get you free patches and NRT 1 800-QUIT NOW  Start with nicotine patches as prescribed and reduce them as advised, follow instructions  --------------------------------------------  Your provider would like to you have your annual eye exam. Please contact your current eye doctor or here are some good options for you to contact.   [facility]   Address: [address] [place], [place] [address] Phone: [phone]  Website: [email or web address] [address] [place], [place] [address] Phone: [phone] [email or web address]  [facility]  Address: [place], [place], [place] [address] Phone: [phone]   [facility] [address] [place], [place] [address] Phone: [phone]  [facility] Address: [place], [place], [place] [address]  Phone: [phone]   Please schedule a Follow-up Appointment to: Return in about 6 months (around 05/27/2018) for DM A1c, Smoking cessation.  If you have any other questions or concerns, please feel free to call the office or send a message through [facility]. You may also schedule an earlier appointment if necessary.  Additionally, you may be receiving a survey about your experience at our office within a few days to 1 week by e-mail or mail. We value your feedback.  [name], DO [place]

## 2017-11-26 NOTE — Assessment & Plan Note (Signed)
Stable, without exacerbation Smoking cessation Albuterol PRN Follow-up if worse

## 2017-11-26 NOTE — Progress Notes (Signed)
Subjective:    Patient ID: Darlene Barber, female    DOB: 09-Dec-1965, 52 y.o.   MRN: [id number]  Darlene Barber is a 52 y.o. female presenting on 11/26/2017 for Annual Exam (obtw patient fell onset 3 weeks ankle swelling still in pain with ROM)   HPI   Here for Annual Physical and Lab Review.  COPD, Mild Prior history of rare COPD flare. Has not had formal PFTs. Not taking daily maintenance therapy. Has albuterol rarely uses PRN  HYPERLIPIDEMIA / Hypertriglyceridemia secondary to DM2 Last lipid showed elevated TG other readings mostly normal Never on statin before, consider - interested to start Fish Oil  Tobacco Abuse - Request patches, cannot get "taste" out of mouth for smoking. She has family member and sister, roommate smoking and she has difficulty quitting smoking. She wants to try NRT Patches. Previously quit smoking in the past with NRT patches.  Elevated MCV Prior lab readings showed elevated MCV, and also history of fatigue. Vitamin B12 and folate were checked and results were normal.  CHRONIC HTN: Reports no concerns. Current Meds - Propranolol 133m daily   Reports good compliance, took meds today. Tolerating well, w/o complaints.  CHRONIC DM, Type 2: Reports no concerns. Last lab showed improved A1c from 7 to 6.4 Meds: Bydureon Pen 221mweekly [place] Reports good compliance. Tolerating well w/o side-effects Currently not on ACEi / ARB - due for urine microalbumin Lifestyle: - Diet (improving DM diet)  - Exercise (limited exercise but plans to improve) Denies hypoglycemia  Health Maintenance:  Re-scheduling colonoscopy, she had incomplete clear with last time and unable to do colonoscopy 11/20/17  UTD Pap Smear 2019.   [facility] 11/26/2017 11/04/2017 05/26/2017  Decreased Interest 0 0 1  Down, Depressed, Hopeless 0 0 0  PHQ - 2 Score 0 0 1  Altered sleeping - - 3  Tired, decreased energy - - 2  Change in appetite - - 2  Feeling bad or  failure about yourself  - - 1  Trouble concentrating - - 0  Moving slowly or fidgety/restless - - 1  Suicidal thoughts - - 0  PHQ-9 Score - - 10  Difficult doing work/chores - - Not difficult at all    Past Medical History:  Diagnosis Date  . Allergy   . Anxiety   . Depression   . Frequent headaches   . Sleep apnea    doesn't use CPAP machine broken,    Past Surgical History:  Procedure Laterality Date  . CESAREAN SECTION     x2  . CHOLECYSTECTOMY    . COLONOSCOPY WITH PROPOFOL N/A 11/20/2017   Procedure: COLONOSCOPY WITH PROPOFOL;  Surgeon: VaLin LandsmanMD;  [facility];  Service: Gastroenterology;  Laterality: N/A;  . HYSTERECTOMY ABDOMINAL WITH SALPINGECTOMY  1998  . HYSTEROSCOPY     Social History   Socioeconomic History  . Marital status: Divorced    Spouse name: Not on file  . Number of children: Not on file  . Years of education: [employer]. Highest education level: Not on file  Occupational History  . Occupation: Unemployed  Social Needs  . Financial resource strain: Not on file  . Food insecurity:    Worry: Not on file    Inability: Not on file  . Transportation needs:    Medical: Not on file    Non-medical: Not on file  Tobacco Use  . Smoking status: Current Every Day Smoker  Packs/day: 0.25    Years: 30.00    Pack years: 7.50    Last attempt to quit: 01/2016    Years since quitting: 1.8  . Smokeless tobacco: Current User  Substance and Sexual Activity  . Alcohol use: Yes    Comment: occ once per year  . Drug use: Yes    Types: Marijuana    Comment: Current use  . Sexual activity: Not Currently  Lifestyle  . Physical activity:    Days per week: Not on file    Minutes per session: Not on file  . Stress: Not on file  Relationships  . Social connections:    Talks on phone: Not on file    Gets together: Not on file    Attends religious service: Not on file    Active member of club or organization: Not on file     Attends meetings of clubs or organizations: Not on file    Relationship status: Not on file  . Intimate partner violence:    Fear of current or ex partner: Not on file    Emotionally abused: Not on file    Physically abused: Not on file    Forced sexual activity: Not on file  Other Topics Concern  . Not on file  Social History Narrative  . Not on file   Family History  Problem Relation Age of Onset  . Heart failure Maternal Grandmother   . Alzheimer's disease Maternal Grandfather   . Emphysema Paternal Grandmother   . Hodgkin's lymphoma Mother   . Diabetes Maternal Aunt   . Diabetes Maternal Uncle   . Cancer Other   . Breast cancer Neg Hx    Current Outpatient Medications on File Prior to Visit  Medication Sig  . benztropine (COGENTIN) 0.5 MG tablet Take 0.5 mg by mouth 2 (two) times daily.  [name] BYDUREON 2 MG PEN INJECT 2 MG INTO THE SKIN ONCE A WEEK.  . cetirizine (ZYRTEC) 10 MG tablet Take 10 mg by mouth.  . clobetasol cream (TEMOVATE) 0.05 % Frequency:BID   Dosage:0.0     Instructions:  Note:Dose: 0.05 %  . cyclobenzaprine (FLEXERIL) 10 MG tablet Take 10 mg by mouth 3 (three) times daily as needed for muscle spasms.  . divalproex (DEPAKOTE) 250 MG DR tablet Take 250 mg by mouth 3 (three) times daily.  [name] estradiol (ESTRACE) 1 MG tablet TAKE 1 TABLET BY MOUTH DAILY  . etodolac (LODINE) 400 MG tablet Take 400 mg by mouth 2 (two) times daily.  . fluticasone (FLONASE) 50 MCG/ACT nasal spray Place 1 spray into both nostrils daily.  [name] lurasidone (LATUDA) 80 MG TABS tablet Take 80 mg by mouth.  . Melatonin 5 MG CAPS Take by mouth.  . mirabegron ER (MYRBETRIQ) 50 MG TB24 tablet Take 1 tablet (50 mg total) by mouth daily.  . nortriptyline (PAMELOR) 25 MG capsule Take 25 mg by mouth at bedtime.  [name] omeprazole (PRILOSEC) 40 MG capsule TAKE 1 CAPSULE BY MOUTH ONCE DAILY. TAKE 30 MINUTES PRIOR TO BREAKFAST  . polyethylene glycol powder (GLYCOLAX/MIRALAX) powder TAKE 17GM (DISSOLVED IN WATER)  BY MOUTH TWICE A DAY FOR CONSTIPATION  . pregabalin (LYRICA) 150 MG capsule Take by mouth.  . promethazine (PHENERGAN) 25 MG tablet TAKE 1 TABLET BY MOUTH EVERY SIX HOURS AS NEEDED FOR NAUSEA VOMITING  . propranolol ER (INDERAL LA) 120 MG 24 hr capsule TAKE 1 CAPSULE (120 MG TOTAL) BY MOUTH DAILY.  [name] PROVENTIL HFA 108 (90 Base) MCG/ACT inhaler  INHALE 2 PUFFS INTO THE LUNGS EVERY 4 HOURS AS NEEDED FOR WHEEZING OR SHORTNESS OF BREATH  . ranitidine (ZANTAC) 150 MG capsule Take 150 mg by mouth.  . valACYclovir (VALTREX) 1000 MG tablet TAKE 1 TABLET BY MOUTH DAILY  . vitamin B-12 (CYANOCOBALAMIN) 100 MCG tablet Take 100 mcg by mouth daily.  . vitamin E 100 UNIT capsule Take by mouth daily.   No current facility-administered medications on file prior to visit.     Review of Systems Per HPI unless specifically indicated above     Objective:    BP 134/78   Pulse 84   Temp 98.2 F (36.8 C) (Oral)   Resp 16   Ht 5' (1.524 m)   Wt 217 lb (98.4 kg)   BMI 42.38 kg/m   Wt Readings from Last 3 Encounters:  11/26/17 217 lb (98.4 kg)  11/20/17 219 lb (99.3 kg)  11/17/17 218 lb (98.9 kg)    Physical Exam   Diabetic Foot Exam - Simple   Simple Foot Form Diabetic Foot exam was performed with the following findings:  Yes 11/26/2017  2:30 PM  Visual Inspection See comments:  Yes Sensation Testing Intact to touch and monofilament testing bilaterally:  Yes Pulse Check Posterior Tibialis and Dorsalis pulse intact bilaterally:  Yes Comments Bilateral mild early callus formation heels and forefoot medially.     Results for orders placed or performed in visit on 11/26/17  POCT UA - Microalbumin  Result Value Ref Range   Microalbumin Ur, POC 20 mg/L      Assessment & Plan:   Problem List Items Addressed This Visit    Chronic bronchitis (HCC)    Stable, without exacerbation Smoking cessation Albuterol PRN Follow-up if worse      Controlled type 2 diabetes mellitus with complication,  without long-term current use of insulin (HCC)    Improved A1c control 7 down to 6.4 Without hyperglycemia Complications - other including hyperlipidemia specifically hypertriglyceridemia, GERD, depression, obesity, hypothyroidism, OSA - increases risk of future cardiovascular complications / poor glucose control due to reduced lifestyle diet/exercise with low energy mood and fatigue  Plan 1. Continue current therapy - Bydureon 31m weekly [place] injection 2. Encourage improved lifestyle - low carb, low sugar diet, reduce portion size, continue improving regular exercise 3. Check urine microalbumin today, mild elevated 20 4. DM Foot exam done today / Advised to schedule DM ophtho exam, send record 5. Follow-up 6 months DM A1c      Relevant Orders   POCT UA - Microalbumin (Completed)   Essential hypertension    Controlled BP No known complication Self discontinued Enalapril in past  Plan 1. Continue current dose  Propranolol from 1276mdaily (24 hr tab) 2. Monitor BP outside office 3. Improve lifestyle, wt loss - Smoking cessation 4. Follow-up 6 months      Hyperlipidemia associated with type 2 diabetes mellitus ([place]   Controlled cholesterol except elevated TG Last lipid panel 11/2017 Calculated ASCVD 10 yr risk score 3-5%  Plan: 1. Offered Statin due to ASCVD risk as diabetic, she declines 2. Encourage improved lifestyle - low carb/cholesterol, reduce portion size, continue improving regular exercise      Morbid obesity with BMI of 40.0-44.9, adult (HCC)    Some weight loss and A1c control on GLP1 Encourage keep improving lifestyle      Tobacco abuse    Active smoker Ready to quit Prior quit on NRT  Quitline # given  Start NRT patches 1 kit  taper 90m down to 14 down to 757m daily patch, taper over 4 weeks then 2 weeks each. Instructions given  Discussion today >5 minutes (<10 minutes) specifically on counseling on risks of tobacco use, complications, treatment,  smoking cessation.        Other Visit Diagnoses    Annual physical exam    -  Primary   Needs flu shot       Relevant Orders   Flu Vaccine QUAD 36+ mos IM (Completed)   Need for 23-polyvalent pneumococcal polysaccharide vaccine       Relevant Orders   Pneumococcal polysaccharide vaccine 23-valent greater than or equal to 2yo subcutaneous/IM (Completed)      Updated Health Maintenance information Reviewed recent lab results with patient Encouraged improvement to lifestyle with diet and exercise - Goal of weight loss   Meds ordered this encounter  Medications  . Nicotine 21-14-7 MG/24HR KIT    Sig: Apply patches as instructed for 24 hours, then remove and replace with new patch. Start with 2144match, reduce to 14 and then 7mg65m advise    Dispense:  1 each    Refill:  1    Follow up plan: Return in about 6 months (around 05/27/2018) for DM A1c, Smoking cessation.  AlexNobie Putnam [facility] 11/26/2017, 6:42 PM

## 2017-11-26 NOTE — Assessment & Plan Note (Addendum)
Controlled BP No known complication Self discontinued Enalapril in past  Plan 1. Continue current dose  Propranolol from 120mg  daily (24 hr tab) 2. Monitor BP outside office 3. Improve lifestyle, wt loss - Smoking cessation 4. Follow-up 6 months

## 2017-11-27 NOTE — Assessment & Plan Note (Signed)
Controlled cholesterol except elevated TG Last lipid panel 11/2017 Calculated ASCVD 10 yr risk score 3-5%  Plan: 1. Offered Statin due to ASCVD risk as diabetic, she declines 2. Encourage improved lifestyle - low carb/cholesterol, reduce portion size, continue improving regular exercise

## 2017-11-27 NOTE — Assessment & Plan Note (Signed)
Improved A1c control 7 down to 6.4 Without hyperglycemia Complications - other including hyperlipidemia specifically hypertriglyceridemia, GERD, depression, obesity, hypothyroidism, OSA - increases risk of future cardiovascular complications / poor glucose control due to reduced lifestyle diet/exercise with low energy mood and fatigue  Plan 1. Continue current therapy - Bydureon 2mg  weekly [place] injection 2. Encourage improved lifestyle - low carb, low sugar diet, reduce portion size, continue improving regular exercise 3. Check urine microalbumin today, mild elevated 20 4. DM Foot exam done today / Advised to schedule DM ophtho exam, send record 5. Follow-up 6 months DM A1c

## 2017-11-27 NOTE — Assessment & Plan Note (Addendum)
Active smoker Ready to quit Prior quit on NRT  Quitline # given  Start NRT patches 1 kit taper 26m down to 14 down to 716m daily patch, taper over 4 weeks then 2 weeks each. Instructions given  Discussion today >5 minutes (<10 minutes) specifically on counseling on risks of tobacco use, complications, treatment, smoking cessation.

## 2017-11-27 NOTE — Assessment & Plan Note (Signed)
Some weight loss and A1c control on GLP1 Encourage keep improving lifestyle

## 2017-12-01 ENCOUNTER — Other Ambulatory Visit: Payer: Medicaid Other

## 2017-12-01 ENCOUNTER — Other Ambulatory Visit: Payer: Self-pay

## 2017-12-01 DIAGNOSIS — N3946 Mixed incontinence: Secondary | ICD-10-CM

## 2017-12-08 ENCOUNTER — Other Ambulatory Visit: Payer: Medicaid Other | Admitting: Urology

## 2017-12-17 ENCOUNTER — Other Ambulatory Visit: Payer: Self-pay | Admitting: Family Medicine

## 2017-12-17 DIAGNOSIS — K219 Gastro-esophageal reflux disease without esophagitis: Secondary | ICD-10-CM

## 2017-12-26 ENCOUNTER — Telehealth: Payer: Self-pay | Admitting: Family Medicine

## 2017-12-26 NOTE — Telephone Encounter (Signed)
Mali with [facility] said pt needs a refill on BD pin needles.  His number is [phone]

## 2017-12-26 NOTE — Telephone Encounter (Signed)
LM for patient to call back to verify that she is using [facility] for [employer].

## 2018-01-06 DIAGNOSIS — H5203 Hypermetropia, bilateral: Secondary | ICD-10-CM | POA: Diagnosis not present

## 2018-01-06 LAB — HM DIABETES EYE EXAM

## 2018-01-07 DIAGNOSIS — H5213 Myopia, bilateral: Secondary | ICD-10-CM | POA: Diagnosis not present

## 2018-01-19 ENCOUNTER — Other Ambulatory Visit: Payer: Medicaid Other

## 2018-01-26 ENCOUNTER — Other Ambulatory Visit: Payer: Medicaid Other | Admitting: Urology

## 2018-02-03 DIAGNOSIS — F3189 Other bipolar disorder: Secondary | ICD-10-CM | POA: Diagnosis not present

## 2018-02-12 ENCOUNTER — Ambulatory Visit
Admission: RE | Admit: 2018-02-12 | Discharge: 2018-02-12 | Disposition: A | Discharge status: Home / Self Care | Payer: Medicaid Other | Source: Ambulatory Visit | Attending: Nurse Practitioner | Admitting: Nurse Practitioner

## 2018-02-12 ENCOUNTER — Encounter: Payer: Self-pay | Admitting: Nurse Practitioner

## 2018-02-12 ENCOUNTER — Ambulatory Visit (HOSPITAL_BASED_OUTPATIENT_CLINIC_OR_DEPARTMENT_OTHER): Payer: Medicaid Other | Admitting: Nurse Practitioner

## 2018-02-12 ENCOUNTER — Other Ambulatory Visit: Payer: Self-pay

## 2018-02-12 VITALS — BP 152/95 | HR 89 | Temp 99.3°F | Ht 59.0 in | Wt 212.0 lb

## 2018-02-12 DIAGNOSIS — I10 Essential (primary) hypertension: Secondary | ICD-10-CM | POA: Insufficient documentation

## 2018-02-12 DIAGNOSIS — M79601 Pain in right arm: Secondary | ICD-10-CM | POA: Insufficient documentation

## 2018-02-12 DIAGNOSIS — M79602 Pain in left arm: Secondary | ICD-10-CM | POA: Insufficient documentation

## 2018-02-12 DIAGNOSIS — F17290 Nicotine dependence, other tobacco product, uncomplicated: Secondary | ICD-10-CM

## 2018-02-12 DIAGNOSIS — G43909 Migraine, unspecified, not intractable, without status migrainosus: Secondary | ICD-10-CM | POA: Insufficient documentation

## 2018-02-12 DIAGNOSIS — G8929 Other chronic pain: Secondary | ICD-10-CM

## 2018-02-12 DIAGNOSIS — Z8249 Family history of ischemic heart disease and other diseases of the circulatory system: Secondary | ICD-10-CM | POA: Insufficient documentation

## 2018-02-12 DIAGNOSIS — K219 Gastro-esophageal reflux disease without esophagitis: Secondary | ICD-10-CM | POA: Insufficient documentation

## 2018-02-12 DIAGNOSIS — M79604 Pain in right leg: Secondary | ICD-10-CM

## 2018-02-12 DIAGNOSIS — M542 Cervicalgia: Secondary | ICD-10-CM | POA: Insufficient documentation

## 2018-02-12 DIAGNOSIS — M1990 Unspecified osteoarthritis, unspecified site: Secondary | ICD-10-CM

## 2018-02-12 DIAGNOSIS — Z833 Family history of diabetes mellitus: Secondary | ICD-10-CM | POA: Insufficient documentation

## 2018-02-12 DIAGNOSIS — E785 Hyperlipidemia, unspecified: Secondary | ICD-10-CM | POA: Insufficient documentation

## 2018-02-12 DIAGNOSIS — G894 Chronic pain syndrome: Secondary | ICD-10-CM | POA: Diagnosis not present

## 2018-02-12 DIAGNOSIS — Z79899 Other long term (current) drug therapy: Secondary | ICD-10-CM

## 2018-02-12 DIAGNOSIS — M25552 Pain in left hip: Principal | ICD-10-CM

## 2018-02-12 DIAGNOSIS — Z789 Other specified health status: Secondary | ICD-10-CM | POA: Diagnosis not present

## 2018-02-12 DIAGNOSIS — M25551 Pain in right hip: Secondary | ICD-10-CM | POA: Insufficient documentation

## 2018-02-12 DIAGNOSIS — M7061 Trochanteric bursitis, right hip: Secondary | ICD-10-CM

## 2018-02-12 DIAGNOSIS — M899 Disorder of bone, unspecified: Secondary | ICD-10-CM | POA: Diagnosis not present

## 2018-02-12 DIAGNOSIS — J302 Other seasonal allergic rhinitis: Secondary | ICD-10-CM | POA: Insufficient documentation

## 2018-02-12 DIAGNOSIS — F209 Schizophrenia, unspecified: Secondary | ICD-10-CM

## 2018-02-12 DIAGNOSIS — E118 Type 2 diabetes mellitus with unspecified complications: Secondary | ICD-10-CM | POA: Insufficient documentation

## 2018-02-12 DIAGNOSIS — F1190 Opioid use, unspecified, uncomplicated: Secondary | ICD-10-CM | POA: Insufficient documentation

## 2018-02-12 DIAGNOSIS — M7062 Trochanteric bursitis, left hip: Secondary | ICD-10-CM | POA: Diagnosis not present

## 2018-02-12 NOTE — Progress Notes (Signed)
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 Posture: WNL   Lower Extremity Exam    Side: Right lower extremity  Side: Left lower extremity  Inspection: No masses, redness, swelling, or asymmetry. No contractures  Inspection: No masses, redness, swelling, or asymmetry. No contractures  Functional ROM: Unrestricted ROM          Functional ROM: Unrestricted ROM          Muscle strength & Tone: Functionally intact  Muscle strength & Tone: Functionally intact  Sensory: Unimpaired  Sensory: Unimpaired  Palpation: No palpable anomalies  Palpation: No palpable anomalies   Assessment  Primary Diagnosis & Pertinent Problem List: The primary encounter diagnosis was Chronic neck pain (Primary Area of Pain) (L>R). Diagnoses of Chronic pain of both upper extremities, Chronic pain of both hips (Tertiary Area of Pain) (R>L), Chronic pain of right lower extremity, Chronic pain syndrome, Opiate use, Pharmacologic therapy, Disorder of skeletal system, and Problems influencing health status were also pertinent to this visit.  Visit Diagnosis: 1. Chronic neck pain  (Primary Area of Pain) (L>R)   2. Chronic pain of both upper extremities   3. Chronic pain of both hips [facility] Area of Pain) (R>L)   4. Chronic pain of right lower extremity   5. Chronic pain syndrome   6. Opiate use   7. Pharmacologic therapy   8. Disorder of skeletal system   9. Problems influencing health status    Plan of Care  Initial treatment plan:  Please be advised that as per protocol, today's visit has been an evaluation only. We have not taken over the patient's controlled substance management.  Problem-specific plan: No  problem-specific Assessment & Plan notes found for this encounter.  Ordered Lab-work, Procedure(s), Referral(s), & Consult(s): Orders Placed This Encounter  Procedures  . DG HIP UNILAT W OR W/O PELVIS 2-3 VIEWS RIGHT  . DG HIP UNILAT W OR W/O PELVIS 2-3 VIEWS LEFT  . Compliance Drug Analysis, Ur  . Magnesium  . Sedimentation rate  . 25-Hydroxyvitamin D Lcms D2+D3  . C-reactive protein   Pharmacotherapy: Medications ordered:  No orders of the defined types were placed in this encounter.  Medications administered during this visit: Melicia D. Cannan had no medications administered during this visit.   Pharmacotherapy under consideration:  Opioid Analgesics: The patient was informed that there is no guarantee that she would be a candidate for opioid analgesics. The decision will be made following CDC guidelines. This decision will be based on the results of diagnostic studies, as well as Ms. Whisman's risk profile.  Membrane stabilizer: To be determined at a later time Muscle relaxant: To be determined at a later time NSAID: To be determined at a later time Other analgesic(s): To be determined at a later time   Interventional therapies under consideration: Ms. Cashin was informed that there is no guarantee that she would be a candidate for interventional therapies. The decision will be based on the results of diagnostic studies, as well as Ms.  Haros's risk profile.  Possible procedure(s): Diagnostic midline cervical epidural steroid injection Diagnostic right intra-articular hip injection   Provider-requested follow-up: Return for 2nd Visit, w/ Dr. Dossie Arbour.  Future Appointments  Date Time Provider [place]  02/16/2018  2:15 PM [facility] None  02/23/2018  3:00 PM Bjorn Loser, MD [facility] None  03/16/2018 11:30 AM Milinda Pointer, MD [facility] None  05/27/2018  2:20 PM Parks Ranger, Devonne Doughty, DO [facility] None    Primary Care Physician: Olin Hauser, DO Location: [facility] Note by:  Date: 02/12/2018; Time: 3:27 PM  Pain Score Disclaimer: We use the NRS-11 scale. This is a self-reported, subjective measurement of pain severity with only modest accuracy. It is used primarily to identify changes within a particular patient. It must be understood that outpatient pain scales are significantly less accurate that those used for research, where they can be applied under ideal controlled circumstances with minimal exposure to variables. In reality, the score is likely to be a combination of pain intensity and pain affect, where pain affect describes the degree of emotional arousal or changes in action readiness caused by the sensory experience of pain. Factors such as social and work situation, setting, emotional state, anxiety levels, expectation, and prior pain experience may influence pain perception and show large inter-individual differences that may also be affected by time variables.  Patient instructions provided during this appointment: Patient Instructions   ____________________________________________________________________________________________  Appointment Policy Summary  It is our goal and responsibility to provide the medical community with assistance in the evaluation and management of patients with chronic pain. Unfortunately our resources are limited.  Because we do not have an unlimited amount of time, or available appointments, we are required to closely monitor and manage their use. The following rules exist to maximize their use:  [name] responsibilities: 1. Punctuality:  At what time should I arrive? You should be physically present in our office 30 minutes before your scheduled appointment. Your scheduled appointment is with your assigned healthcare provider. However, it takes 5-10 minutes to be "checked-in", and another 15 minutes for the nurses to do the admission. If you arrive to our  office at the time you were given for your appointment, you will end up being at least 20-25 minutes late to your appointment with the provider. 2. Tardiness:  What happens if I arrive only a few minutes after my scheduled appointment time? You will need to reschedule your appointment. The cutoff is your appointment time. This is why it is so important that you arrive at least 30 minutes before that appointment. If you have an appointment scheduled for 10:00 AM and you arrive at 10:01, you will be required to reschedule your appointment.  3. Plan ahead:  Always assume that you will encounter traffic on your way in. Plan for it. If you are dependent on a driver, make sure they understand these rules and the need to arrive early. 4. Other appointments and responsibilities:  Avoid scheduling any other appointments before or after your pain clinic appointments.  5. Be prepared:  Write down everything that you need to discuss with your healthcare provider and give this information to the admitting nurse. Write down the medications that you will need refilled. Bring your pills and bottles (even the empty ones), to all of your appointments, except for those where a procedure is scheduled. 6. No children or pets:  Find someone to take care of them. It is not appropriate to bring them in. 7. Scheduling changes:  We request "advanced notification" of any changes or  cancellations. 8. Advanced notification:  Defined as a time period of more than 24 hours prior to the originally scheduled appointment. This allows for the appointment to be offered to other patients. 9. Rescheduling:  When a visit is rescheduled, it will require the cancellation of the original appointment. For this reason they both fall within the category of "Cancellations".  10. Cancellations:  They require advanced notification. Any cancellation less than 24 hours before the  appointment will be recorded as a "No Show". 11. No Show:  Defined as an unkept appointment where the patient failed to notify or declare to the practice their intention or inability to keep the appointment.  Corrective process for repeat offenders:  1. Tardiness: Three (3) episodes of rescheduling due to late arrivals will be recorded as one (1) "No Show". 2. Cancellation or reschedule: Three (3) cancellations or rescheduling will be recorded as one (1) "No Show". 3. "No Shows": Three (3) "No Shows" within a 12 month period will result in discharge from the practice. ____________________________________________________________________________________________   ______________________________________________________________________________________________  Specialty Pain Scale  Introduction:  There are significant differences in how pain is reported. The word pain usually refers to physical pain, but it is also a common synonym of suffering. The medical community uses a scale from 0 (zero) to 10 (ten) to report pain level. Zero (0) is described as "no pain", while ten (10) is described as "the worse pain you can imagine". The problem with this scale is that physical pain is reported along with suffering. Suffering refers to mental pain, or more often yet it refers to any unpleasant feeling, emotion or aversion associated with the perception of harm or threat of harm. It is the psychological component of pain.  Pain  Specialists prefer to separate the two components. The pain scale used by this practice is the Verbal Numerical Rating Scale (VNRS-11). This scale is for the physical pain only. DO NOT INCLUDE how your pain psychologically affects you. This scale is for adults 4 years of age and older. It has 11 (eleven) levels. The 1st level is 0/10. This  means: "right now, I have no pain". In the context of pain management, it also means: "right now, my physical pain is under control with the current therapy".  General Information:  The scale should reflect your current level of pain. Unless you are specifically asked for the level of your worst pain, or your average pain. If you are asked for one of these two, then it should be understood that it is over the past 24 hours.  Levels 1 (one) through 5 (five) are described below, and can be treated as an outpatient. Ambulatory pain management facilities such as ours are more than adequate to treat these levels. Levels 6 (six) through 10 (ten) are also described below, however, these must be treated as a hospitalized patient. While levels 6 (six) and 7 (seven) may be evaluated at an urgent care facility, levels 8 (eight) through 10 (ten) constitute medical emergencies and as such, they belong in a hospital's emergency department. When having these levels (as described below), do not come to our office. Our facility is not equipped to manage these levels. Go directly to an urgent care facility or an emergency department to be evaluated.  Definitions:  Activities of Daily Living (ADL): Activities of daily living (ADL or ADLs) is a term used in healthcare to refer to people's daily self-care activities. Health professionals often use a person's ability or inability to perform ADLs as a measurement of their functional status, particularly in regard to people post injury, with disabilities and the elderly. There are two ADL levels: Basic and Instrumental. Basic Activities of  Daily Living (BADL  or BADLs) consist of self-care tasks that include: Bathing and showering; personal hygiene and grooming (including brushing/combing/styling hair); dressing; Toilet hygiene (getting to the toilet, cleaning oneself, and getting back up); eating and self-feeding (not including cooking or chewing and swallowing); functional mobility, often referred to as "transferring", as measured by the ability to walk, get in and out of bed, and get into and out of a chair; the broader definition (moving from one place to another while performing activities) is useful for people with different physical abilities who are still able to get around independently. Basic ADLs include the things many people do when they get up in the morning and get ready to go out of the house: get out of bed, go to the toilet, bathe, dress, groom, and eat. On the average, loss of function typically follows a particular order. Hygiene is the first to go, followed by loss of toilet use and locomotion. The last to go is the ability to eat. When there is only one remaining area in which the person is independent, there is a 62.9% chance that it is eating and only a 3.5% chance that it is hygiene. Instrumental Activities of Daily Living (IADL or IADLs) are not necessary for fundamental functioning, but they let an individual live independently in a community. IADL consist of tasks that include: cleaning and maintaining the house; home establishment and maintenance; care of others (including selecting and supervising caregivers); care of pets; child rearing; managing money; managing financials (investments, etc.); meal preparation and cleanup; shopping for groceries and necessities; moving within the community; safety procedures and emergency responses; health management and maintenance (taking prescribed medications); and using the telephone or other form of communication.  Instructions:  Most patients tend to report their pain as a  combination of two factors, their physical pain and their psychosocial pain. This last one is also known as "suffering" and it  is reflection of how physical pain affects you socially and psychologically. From now on, report them separately.  From this point on, when asked to report your pain level, report only your physical pain. Use the following table for reference.  Pain Clinic Pain Levels (0-5/10)  Pain Level Score  Description  No Pain 0   Mild pain 1 Nagging, annoying, but does not interfere with basic activities of daily living (ADL). Patients are able to eat, bathe, get dressed, toileting (being able to get on and off the toilet and perform personal hygiene functions), transfer (move in and out of bed or a chair without assistance), and maintain continence (able to control bladder and bowel functions). Blood pressure and heart rate are unaffected. A normal heart rate for a healthy adult ranges from 60 to 100 bpm (beats per minute).   Mild to moderate pain 2 Noticeable and distracting. Impossible to hide from other people. More frequent flare-ups. Still possible to adapt and function close to normal. It can be very annoying and may have occasional stronger flare-ups. With discipline, patients may get used to it and adapt.   Moderate pain 3 Interferes significantly with activities of daily living (ADL). It becomes difficult to feed, bathe, get dressed, get on and off the toilet or to perform personal hygiene functions. Difficult to get in and out of bed or a chair without assistance. Very distracting. With effort, it can be ignored when deeply involved in activities.   Moderately severe pain 4 Impossible to ignore for more than a few minutes. With effort, patients may still be able to manage work or participate in some social activities. Very difficult to concentrate. Signs of autonomic nervous system discharge are evident: dilated pupils (mydriasis); mild sweating (diaphoresis); sleep  interference. Heart rate becomes elevated (>115 bpm). Diastolic blood pressure (lower number) rises above 100 mmHg. Patients find relief in laying down and not moving.   Severe pain 5 Intense and extremely unpleasant. Associated with frowning face and frequent crying. Pain overwhelms the senses.  Ability to do any activity or maintain social relationships becomes significantly limited. Conversation becomes difficult. Pacing back and forth is common, as getting into a comfortable position is nearly impossible. Pain wakes you up from deep sleep. Physical signs will be obvious: pupillary dilation; increased sweating; goosebumps; brisk reflexes; cold, clammy hands and feet; nausea, vomiting or dry heaves; loss of appetite; significant sleep disturbance with inability to fall asleep or to remain asleep. When persistent, significant weight loss is observed due to the complete loss of appetite and sleep deprivation.  Blood pressure and heart rate becomes significantly elevated. Caution: If elevated blood pressure triggers a pounding headache associated with blurred vision, then the patient should immediately seek attention at an urgent or emergency care unit, as these may be signs of an impending stroke.    Emergency Department Pain Levels (6-10/10)  Emergency Room Pain 6 Severely limiting. Requires emergency care and should not be seen or managed at an outpatient pain management facility. Communication becomes difficult and requires great effort. Assistance to reach the emergency department may be required. Facial flushing and profuse sweating along with potentially dangerous increases in heart rate and blood pressure will be evident.   Distressing pain 7 Self-care is very difficult. Assistance is required to transport, or use restroom. Assistance to reach the emergency department will be required. Tasks requiring coordination, such as bathing and getting dressed become very difficult.   Disabling pain 8  Self-care is no longer possible. At  this level, pain is disabling. The individual is unable to do even the most "basic" activities such as walking, eating, bathing, dressing, transferring to a bed, or toileting. Fine motor skills are lost. It is difficult to think clearly.   Incapacitating pain 9 Pain becomes incapacitating. Thought processing is no longer possible. Difficult to remember your own name. Control of movement and coordination are lost.   The worst pain imaginable 10 At this level, most patients pass out from pain. When this level is reached, collapse of the autonomic nervous system occurs, leading to a sudden drop in blood pressure and heart rate. This in turn results in a temporary and dramatic drop in blood flow to the brain, leading to a loss of consciousness. Fainting is one of the body's self defense mechanisms. Passing out puts the brain in a calmed state and causes it to shut down for a while, in order to begin the healing process.    Summary: 1. Refer to this scale when providing [place] with your pain level. 2. Be accurate and careful when reporting your pain level. This will help with your care. 3. Over-reporting your pain level will lead to loss of credibility. 4. Even a level of 1/10 means that there is pain and will be treated at our facility. 5. High, inaccurate reporting will be documented as "Symptom Exaggeration", leading to loss of credibility and suspicions of possible secondary gains such as obtaining more narcotics, or wanting to appear disabled, for fraudulent reasons. 6. Only pain levels of 5 or below will be seen at our facility. 7. Pain levels of 6 and above will be sent to the Emergency Department and the appointment cancelled. ______________________________________________________________________________________________  BMI Assessment: Estimated body mass index is 42.82 kg/m as calculated from the following:   Height as of this encounter: _0  (1.499 m).    Weight as of this encounter: 212 lb (96.2 kg).  BMI interpretation table: BMI level Category Range association with higher incidence of chronic pain  <18 kg/m2 Underweight   18.5-24.9 kg/m2 Ideal body weight   25-29.9 kg/m2 Overweight Increased incidence by 20%  30-34.9 kg/m2 Obese (Class I) Increased incidence by 68%  35-39.9 kg/m2 Severe obesity (Class II) Increased incidence by 136%  >40 kg/m2 Extreme obesity (Class III) Increased incidence by 254%   BMI Readings from Last 4 Encounters:  02/12/18 42.82 kg/m  11/26/17 42.38 kg/m  11/20/17 44.23 kg/m  11/17/17 44.03 kg/m   Wt Readings from Last 4 Encounters:  02/12/18 212 lb (96.2 kg)  11/26/17 217 lb (98.4 kg)  11/20/17 219 lb (99.3 kg)  11/17/17 218 lb (98.9 kg)

## 2018-02-12 NOTE — Patient Instructions (Addendum)
____________________________________________________________________________________________  Appointment Policy Summary  It is our goal and responsibility to provide the medical community with assistance in the evaluation and management of patients with chronic pain. Unfortunately our resources are limited. Because we do not have an unlimited amount of time, or available appointments, we are required to closely monitor and manage their use. The following rules exist to maximize their use:  [name] responsibilities: 1. Punctuality:  At what time should I arrive? You should be physically present in our office 30 minutes before your scheduled appointment. Your scheduled appointment is with your assigned healthcare provider. However, it takes 5-10 minutes to be "checked-in", and another 15 minutes for the nurses to do the admission. If you arrive to our office at the time you were given for your appointment, you will end up being at least 20-25 minutes late to your appointment with the provider. 2. Tardiness:  What happens if I arrive only a few minutes after my scheduled appointment time? You will need to reschedule your appointment. The cutoff is your appointment time. This is why it is so important that you arrive at least 30 minutes before that appointment. If you have an appointment scheduled for 10:00 AM and you arrive at 10:01, you will be required to reschedule your appointment.  3. Plan ahead:  Always assume that you will encounter traffic on your way in. Plan for it. If you are dependent on a driver, make sure they understand these rules and the need to arrive early. 4. Other appointments and responsibilities:  Avoid scheduling any other appointments before or after your pain clinic appointments.  5. Be prepared:  Write down everything that you need to discuss with your healthcare provider and give this information to the admitting nurse. Write down the medications that you will need  refilled. Bring your pills and bottles (even the empty ones), to all of your appointments, except for those where a procedure is scheduled. 6. No children or pets:  Find someone to take care of them. It is not appropriate to bring them in. 7. Scheduling changes:  We request "advanced notification" of any changes or cancellations. 8. Advanced notification:  Defined as a time period of more than 24 hours prior to the originally scheduled appointment. This allows for the appointment to be offered to other patients. 9. Rescheduling:  When a visit is rescheduled, it will require the cancellation of the original appointment. For this reason they both fall within the category of "Cancellations".  10. Cancellations:  They require advanced notification. Any cancellation less than 24 hours before the  appointment will be recorded as a "No Show". 11. No Show:  Defined as an unkept appointment where the patient failed to notify or declare to the practice their intention or inability to keep the appointment.  Corrective process for repeat offenders:  1. Tardiness: Three (3) episodes of rescheduling due to late arrivals will be recorded as one (1) "No Show". 2. Cancellation or reschedule: Three (3) cancellations or rescheduling will be recorded as one (1) "No Show". 3. "No Shows": Three (3) "No Shows" within a 12 month period will result in discharge from the practice. ____________________________________________________________________________________________   ______________________________________________________________________________________________  Specialty Pain Scale  Introduction:  There are significant differences in how pain is reported. The word pain usually refers to physical pain, but it is also a common synonym of suffering. The medical community uses a scale from 0 (zero) to 10 (ten) to report pain level. Zero (0) is described as "no pain",   while ten (10) is described as "the worse pain  you can imagine". The problem with this scale is that physical pain is reported along with suffering. Suffering refers to mental pain, or more often yet it refers to any unpleasant feeling, emotion or aversion associated with the perception of harm or threat of harm. It is the psychological component of pain.  Pain Specialists prefer to separate the two components. The pain scale used by this practice is the Verbal Numerical Rating Scale (VNRS-11). This scale is for the physical pain only. DO NOT INCLUDE how your pain psychologically affects you. This scale is for adults 21 years of age and older. It has 11 (eleven) levels. The 1st level is 0/10. This means: "right now, I have no pain". In the context of pain management, it also means: "right now, my physical pain is under control with the current therapy".  General Information:  The scale should reflect your current level of pain. Unless you are specifically asked for the level of your worst pain, or your average pain. If you are asked for one of these two, then it should be understood that it is over the past 24 hours.  Levels 1 (one) through 5 (five) are described below, and can be treated as an outpatient. Ambulatory pain management facilities such as ours are more than adequate to treat these levels. Levels 6 (six) through 10 (ten) are also described below, however, these must be treated as a hospitalized patient. While levels 6 (six) and 7 (seven) may be evaluated at an urgent care facility, levels 8 (eight) through 10 (ten) constitute medical emergencies and as such, they belong in a hospital's emergency department. When having these levels (as described below), do not come to our office. Our facility is not equipped to manage these levels. Go directly to an urgent care facility or an emergency department to be evaluated.  Definitions:  Activities of Daily Living (ADL): Activities of daily living (ADL or ADLs) is a term used in healthcare to refer to  people's daily self-care activities. Health professionals often use a person's ability or inability to perform ADLs as a measurement of their functional status, particularly in regard to people post injury, with disabilities and the elderly. There are two ADL levels: Basic and Instrumental. Basic Activities of Daily Living (BADL  or BADLs) consist of self-care tasks that include: Bathing and showering; personal hygiene and grooming (including brushing/combing/styling hair); dressing; Toilet hygiene (getting to the toilet, cleaning oneself, and getting back up); eating and self-feeding (not including cooking or chewing and swallowing); functional mobility, often referred to as "transferring", as measured by the ability to walk, get in and out of bed, and get into and out of a chair; the broader definition (moving from one place to another while performing activities) is useful for people with different physical abilities who are still able to get around independently. Basic ADLs include the things many people do when they get up in the morning and get ready to go out of the house: get out of bed, go to the toilet, bathe, dress, groom, and eat. On the average, loss of function typically follows a particular order. Hygiene is the first to go, followed by loss of toilet use and locomotion. The last to go is the ability to eat. When there is only one remaining area in which the person is independent, there is a 62.9% chance that it is eating and only a 3.5% chance that it is hygiene. Instrumental Activities   of Daily Living (IADL or IADLs) are not necessary for fundamental functioning, but they let an individual live independently in a community. IADL consist of tasks that include: cleaning and maintaining the house; home establishment and maintenance; care of others (including selecting and supervising caregivers); care of pets; child rearing; managing money; managing financials (investments, etc.); meal preparation  and cleanup; shopping for groceries and necessities; moving within the community; safety procedures and emergency responses; health management and maintenance (taking prescribed medications); and using the telephone or other form of communication.  Instructions:  Most patients tend to report their pain as a combination of two factors, their physical pain and their psychosocial pain. This last one is also known as "suffering" and it is reflection of how physical pain affects you socially and psychologically. From now on, report them separately.  From this point on, when asked to report your pain level, report only your physical pain. Use the following table for reference.  Pain Clinic Pain Levels (0-5/10)  Pain Level Score  Description  No Pain 0   Mild pain 1 Nagging, annoying, but does not interfere with basic activities of daily living (ADL). Patients are able to eat, bathe, get dressed, toileting (being able to get on and off the toilet and perform personal hygiene functions), transfer (move in and out of bed or a chair without assistance), and maintain continence (able to control bladder and bowel functions). Blood pressure and heart rate are unaffected. A normal heart rate for a healthy adult ranges from 60 to 100 bpm (beats per minute).   Mild to moderate pain 2 Noticeable and distracting. Impossible to hide from other people. More frequent flare-ups. Still possible to adapt and function close to normal. It can be very annoying and may have occasional stronger flare-ups. With discipline, patients may get used to it and adapt.   Moderate pain 3 Interferes significantly with activities of daily living (ADL). It becomes difficult to feed, bathe, get dressed, get on and off the toilet or to perform personal hygiene functions. Difficult to get in and out of bed or a chair without assistance. Very distracting. With effort, it can be ignored when deeply involved in activities.   Moderately severe pain  4 Impossible to ignore for more than a few minutes. With effort, patients may still be able to manage work or participate in some social activities. Very difficult to concentrate. Signs of autonomic nervous system discharge are evident: dilated pupils (mydriasis); mild sweating (diaphoresis); sleep interference. Heart rate becomes elevated (>115 bpm). Diastolic blood pressure (lower number) rises above 100 mmHg. Patients find relief in laying down and not moving.   Severe pain 5 Intense and extremely unpleasant. Associated with frowning face and frequent crying. Pain overwhelms the senses.  Ability to do any activity or maintain social relationships becomes significantly limited. Conversation becomes difficult. Pacing back and forth is common, as getting into a comfortable position is nearly impossible. Pain wakes you up from deep sleep. Physical signs will be obvious: pupillary dilation; increased sweating; goosebumps; brisk reflexes; cold, clammy hands and feet; nausea, vomiting or dry heaves; loss of appetite; significant sleep disturbance with inability to fall asleep or to remain asleep. When persistent, significant weight loss is observed due to the complete loss of appetite and sleep deprivation.  Blood pressure and heart rate becomes significantly elevated. Caution: If elevated blood pressure triggers a pounding headache associated with blurred vision, then the patient should immediately seek attention at an urgent or emergency care unit, as   these may be signs of an impending stroke.    Emergency Department Pain Levels (6-10/10)  Emergency Room Pain 6 Severely limiting. Requires emergency care and should not be seen or managed at an outpatient pain management facility. Communication becomes difficult and requires great effort. Assistance to reach the emergency department may be required. Facial flushing and profuse sweating along with potentially dangerous increases in heart rate and blood pressure  will be evident.   Distressing pain 7 Self-care is very difficult. Assistance is required to transport, or use restroom. Assistance to reach the emergency department will be required. Tasks requiring coordination, such as bathing and getting dressed become very difficult.   Disabling pain 8 Self-care is no longer possible. At this level, pain is disabling. The individual is unable to do even the most "basic" activities such as walking, eating, bathing, dressing, transferring to a bed, or toileting. Fine motor skills are lost. It is difficult to think clearly.   Incapacitating pain 9 Pain becomes incapacitating. Thought processing is no longer possible. Difficult to remember your own name. Control of movement and coordination are lost.   The worst pain imaginable 10 At this level, most patients pass out from pain. When this level is reached, collapse of the autonomic nervous system occurs, leading to a sudden drop in blood pressure and heart rate. This in turn results in a temporary and dramatic drop in blood flow to the brain, leading to a loss of consciousness. Fainting is one of the body's self defense mechanisms. Passing out puts the brain in a calmed state and causes it to shut down for a while, in order to begin the healing process.    Summary: 1. Refer to this scale when providing [place] with your pain level. 2. Be accurate and careful when reporting your pain level. This will help with your care. 3. Over-reporting your pain level will lead to loss of credibility. 4. Even a level of 1/10 means that there is pain and will be treated at our facility. 5. High, inaccurate reporting will be documented as "Symptom Exaggeration", leading to loss of credibility and suspicions of possible secondary gains such as obtaining more narcotics, or wanting to appear disabled, for fraudulent reasons. 6. Only pain levels of 5 or below will be seen at our facility. 7. Pain levels of 6 and above will be sent to the  Emergency Department and the appointment cancelled. ______________________________________________________________________________________________  BMI Assessment: Estimated body mass index is 42.82 kg/m as calculated from the following:   Height as of this encounter: 4\' 11"  (1.499 m).   Weight as of this encounter: 212 lb (96.2 kg).  BMI interpretation table: BMI level Category Range association with higher incidence of chronic pain  <18 kg/m2 Underweight   18.5-24.9 kg/m2 Ideal body weight   25-29.9 kg/m2 Overweight Increased incidence by 20%  30-34.9 kg/m2 Obese (Class I) Increased incidence by 68%  35-39.9 kg/m2 Severe obesity (Class II) Increased incidence by 136%  >40 kg/m2 Extreme obesity (Class III) Increased incidence by 254%   BMI Readings from Last 4 Encounters:  02/12/18 42.82 kg/m  11/26/17 42.38 kg/m  11/20/17 44.23 kg/m  11/17/17 44.03 kg/m   Wt Readings from Last 4 Encounters:  02/12/18 212 lb (96.2 kg)  11/26/17 217 lb (98.4 kg)  11/20/17 219 lb (99.3 kg)  11/17/17 218 lb (98.9 kg)

## 2018-02-16 ENCOUNTER — Other Ambulatory Visit: Payer: Medicaid Other

## 2018-02-16 DIAGNOSIS — N3946 Mixed incontinence: Secondary | ICD-10-CM | POA: Diagnosis not present

## 2018-02-16 LAB — 25-HYDROXY VITAMIN D LCMS D2+D3
25-Hydroxy, Vitamin D-2: <1 ng/mL
25-Hydroxy, Vitamin D-3: 18 ng/mL
25-Hydroxy, Vitamin D: 19 ng/mL — ABNORMAL LOW

## 2018-02-16 LAB — URINALYSIS, COMPLETE
Bilirubin, UA: NEGATIVE
Glucose, UA: NEGATIVE
Leukocytes, UA: NEGATIVE
Nitrite, UA: NEGATIVE
RBC, UA: NEGATIVE
Specific Gravity, UA: 1.03 (ref 1.005–1.030)
Urobilinogen, Ur: 1 mg/dL (ref 0.2–1.0)
pH, UA: 6 (ref 5.0–7.5)

## 2018-02-16 LAB — MAGNESIUM: Magnesium: 1.6 mg/dL (ref 1.6–2.3)

## 2018-02-16 LAB — C-REACTIVE PROTEIN: CRP: 9 mg/L (ref 0–10)

## 2018-02-16 LAB — MICROSCOPIC EXAMINATION
RBC MICROSCOPIC, UA: NONE SEEN /HPF (ref 0–2)
WBC UA: NONE SEEN /HPF (ref 0–5)

## 2018-02-16 LAB — SEDIMENTATION RATE: Sed Rate: 22 mm/hr (ref 0–40)

## 2018-02-17 LAB — COMPLIANCE DRUG ANALYSIS, UR

## 2018-02-18 LAB — CULTURE, URINE COMPREHENSIVE

## 2018-02-19 ENCOUNTER — Telehealth: Payer: Self-pay | Admitting: Family Medicine

## 2018-02-19 ENCOUNTER — Telehealth: Payer: Self-pay | Admitting: Urology

## 2018-02-19 MED ORDER — NITROFURANTOIN MONOHYD MACRO 100 MG PO CAPS: 100.0000 mg | ORAL_CAPSULE | Freq: Two times a day (BID) | ORAL | 0 refills | Status: DC

## 2018-02-19 NOTE — Telephone Encounter (Signed)
nitrofurantoin, macrocrystal-monohydrate, (MACROBID) 100 MG capsule  Start: 02/19/18, 100 mg, Oral, Every 12 hours, Prio: Routine, Status: Sent  When 02/19/18 7371   Ordering Provider MacDiarmid, Nicki Reaper, MD

## 2018-02-19 NOTE — Telephone Encounter (Signed)
Patient notified and voiced understanding.

## 2018-02-19 NOTE — Telephone Encounter (Signed)
Spoke with patient and instructed her that she does not need to take the Cipro prior to the Botox while taking the Nitrofuration

## 2018-02-19 NOTE — Telephone Encounter (Signed)
Pt called office today asking about abx currently, also prior to, during and after botox.  I apologize for not being able to help her.  We were extremely busy and I left her on hold for about 15 minutes.  Please give pt a call.

## 2018-02-23 ENCOUNTER — Encounter: Payer: Self-pay | Admitting: Urology

## 2018-02-23 ENCOUNTER — Ambulatory Visit: Payer: Medicaid Other | Admitting: Urology

## 2018-02-23 DIAGNOSIS — N3946 Mixed incontinence: Secondary | ICD-10-CM

## 2018-02-23 LAB — URINALYSIS, COMPLETE
BILIRUBIN UA: NEGATIVE
Glucose, UA: NEGATIVE
KETONES UA: NEGATIVE
Leukocytes, UA: NEGATIVE
Nitrite, UA: NEGATIVE
Protein, UA: NEGATIVE
RBC, UA: NEGATIVE
Urobilinogen, Ur: 0.2 mg/dL (ref 0.2–1.0)
pH, UA: 6 (ref 5.0–7.5)

## 2018-02-23 LAB — MICROSCOPIC EXAMINATION: WBC, UA: NONE SEEN /hpf (ref 0–5)

## 2018-02-23 NOTE — Progress Notes (Signed)
02/23/2018 3:22 PM   Darlene Barber 20-Jan-1966 [id number]  Referring provider: Olin Hauser, DO [address] [place], [place] [address]  Chief Complaint  Patient presents with  . Botox    HPI: Patient was consulted for worsening incontinence over 1 year especially since November. She leaks with coughing sneezing standing and bending. She has urge incontinence that is quite sudden. I think she has small volume bedwetting.She wears 3 pads a day that can be quite wet.  She voids every 2 or 3 hours and is no nocturia. Her flow is good.   Mild grade 2 hypermobility of the bladder neck with a negative cough test with a mild cough. Grade 1 cystocele and no rectocele  The patient has mixed incontinence and mild bedwetting. Both components seem to be quite significant.  On urodynamics the patient was catheterized for 100 mL. Bladder capacity was 622 mL. The bladder was unstable reaching a pressure of 5 cm of waterfelt asurgency but she did not leak. She did note that she leaks when she goes from a sitting to standing position and that her urges will hit her suddenly. She did not leak with a Valsalva pressure of 122 cm of water. During voluntary voiding she voided 575 mL. Maximal flow was 8 mils per second. Maximum voiding pressure is 38 semis water. She emptied efficiently. She was straining some. EMG activity increased during the voiding phase. Bladder neck descent at 1 or 2 cm.   The patient has mild stress incontinence and clinically an overactive bladder with urge incontinence and mild bedwetting. I will try to help her with medical and behavioral therapy. She may be a candidate to consider a sling in the future and I would want to re-quantitate her stress component. Ongoing OAB symptoms and bedwetting would need to be discussePelvic floor exercises and fluid modifications also started.  She did not respond to Myrbetriq Patient leaks with coughing  but not that much unless she has a bad cough with bronchitis. She is trying to stop smoking.   Reassess in 2 months. Detrol LA and oxybutynin ER 10 mg given. Re-quantitate goals and the role of a sling versus refractory therapy.  Today Patient has minimal stress incontinence unless she has bronchitis.  She still has urge incontinence with very little warning as her primary symptom.  Because of insurance we did not talk about percutaneous tibial nerve stimulation.  We talked about Botox and InterStim with usual templates.  She would like to try Botox first to try to avoid surgery.  She is a smoker and I will perform cystoscopy prior to the Botox to make certain her bladder is clear and this was discussed.  3 days ciprofloxacin prescription was given.  She has not had blood in the urine microscopically  Today [name] blood pressure was approximately 160/107.  She was not symptomatic but it was measured twice.  I truly felt I should not do an invasive procedure with the blood pressure being this high.  She is not on her current blood pressure pill.  This should be controlled better before proceeding with a tox treatment.  Urgent care primary care and ER recommendation granted the patient   PMH: Past Medical History:  Diagnosis Date  . Allergy   . Anxiety   . Bronchitis   . Bursitis of both hips   . Depression   . Frequent headaches   . Obesity   . Sleep apnea    doesn't use CPAP machine  broken,     Surgical History: Past Surgical History:  Procedure Laterality Date  . CESAREAN SECTION     x2  . CHOLECYSTECTOMY    . COLONOSCOPY WITH PROPOFOL N/A 11/20/2017   Procedure: COLONOSCOPY WITH PROPOFOL;  Surgeon: Lin Landsman, MD;  Location: [facility] ENDOSCOPY;  Service: Gastroenterology;  Laterality: N/A;  . HYSTERECTOMY ABDOMINAL WITH SALPINGECTOMY  1998  . HYSTEROSCOPY      Home Medications:  Allergies as of 02/23/2018      Reactions   Piper Other (See Comments)   Black  pepper Feels like throat is closing, itchy Feels like throat is closing, itchy Feels like throat is closing, itchy   Tape Rash   Paper tape is ok to use.      Medication List       Accurate as of February 23, 2018  3:22 PM. Always use your most recent med list.        benztropine 0.5 MG tablet Commonly known as:  COGENTIN Take 0.5 mg by mouth 2 (two) times daily.   BYDUREON 2 MG Pen Generic drug:  Exenatide ER INJECT 2 MG INTO THE SKIN ONCE A WEEK.   clobetasol cream 0.05 % Commonly known as:  TEMOVATE Frequency:BID   Dosage:0.0     Instructions:  Note:Dose: 0.05 %   cloNIDine 0.2 MG tablet Commonly known as:  CATAPRES Take 0.2 mg by mouth daily.   cyclobenzaprine 10 MG tablet Commonly known as:  FLEXERIL Take 10 mg by mouth 3 (three) times daily as needed for muscle spasms.   divalproex 250 MG DR tablet Commonly known as:  DEPAKOTE Take 250 mg by mouth 3 (three) times daily.   estradiol 1 MG tablet Commonly known as:  ESTRACE TAKE 1 TABLET BY MOUTH DAILY   etodolac 400 MG tablet Commonly known as:  LODINE Take 400 mg by mouth 2 (two) times daily.   fluticasone 50 MCG/ACT nasal spray Commonly known as:  FLONASE Place 1 spray into both nostrils daily.   lurasidone 80 MG Tabs tablet Commonly known as:  LATUDA Take 80 mg by mouth.   Melatonin 5 MG Caps Take by mouth.   nitrofurantoin (macrocrystal-monohydrate) 100 MG capsule Commonly known as:  MACROBID Take 1 capsule (100 mg total) by mouth every 12 (twelve) hours.   nortriptyline 25 MG capsule Commonly known as:  PAMELOR Take 25 mg by mouth at bedtime.   omeprazole 40 MG capsule Commonly known as:  PRILOSEC TAKE 1 CAPSULE BY MOUTH ONCE DAILY. TAKE 30 MINUTES PRIOR TO BREAKFAST   pregabalin 150 MG capsule Commonly known as:  LYRICA Take by mouth.   promethazine 25 MG tablet Commonly known as:  PHENERGAN TAKE 1 TABLET BY MOUTH EVERY SIX HOURS AS NEEDED FOR NAUSEA VOMITING   propranolol ER 120  MG 24 hr capsule Commonly known as:  INDERAL LA TAKE 1 CAPSULE (120 MG TOTAL) BY MOUTH DAILY.   PROVENTIL HFA 108 (90 Base) MCG/ACT inhaler Generic drug:  albuterol INHALE 2 PUFFS INTO THE LUNGS EVERY 4 HOURS AS NEEDED FOR WHEEZING OR SHORTNESS OF BREATH   ranitidine 150 MG capsule Commonly known as:  ZANTAC Take 150 mg by mouth.   senna 8.6 MG Tabs tablet Commonly known as:  SENOKOT Take 1 tablet by mouth daily.   valACYclovir 1000 MG tablet Commonly known as:  VALTREX TAKE 1 TABLET BY MOUTH DAILY       Allergies:  Allergies  Allergen Reactions  . Piper Other (See Comments)  Black pepper Feels like throat is closing, itchy Feels like throat is closing, itchy Feels like throat is closing, itchy  . Tape Rash    Paper tape is ok to use.    Family History: Family History  Problem Relation Age of Onset  . Heart failure Maternal Grandmother   . Alzheimer's disease Maternal Grandfather   . Emphysema Paternal Grandmother   . Hodgkin's lymphoma Mother   . Diabetes Maternal Aunt   . Diabetes Maternal Uncle   . Cancer Other   . Breast cancer Neg Hx     Social History:  reports that she quit smoking about 2 years ago. She has a 7.50 pack-year smoking history. She uses smokeless tobacco. She reports current alcohol use. She reports current drug use. Drug: Marijuana.  ROS:                                        Physical Exam: There were no vitals taken for this visit.  Constitutional:  Alert and oriented, No acute distress.   Laboratory Data: Lab Results  Component Value Date   WBC 9.4 11/19/2017   HGB 14.4 11/19/2017   HCT 41.3 11/19/2017   MCV 96.0 11/19/2017   PLT 306 11/19/2017    Lab Results  Component Value Date   CREATININE 0.69 11/19/2017    No results found for: PSA  No results found for: TESTOSTERONE  Lab Results  Component Value Date   HGBA1C 6.4 (H) 11/19/2017    Urinalysis    Component Value Date/Time    COLORURINE Yellow 04/20/2012 1725   APPEARANCEUR Clear 02/16/2018 1357   LABSPEC 1.010 04/20/2012 1725   PHURINE 6.0 04/20/2012 1725   GLUCOSEU Negative 02/16/2018 1357   GLUCOSEU Negative 04/20/2012 1725   HGBUR Negative 04/20/2012 1725   BILIRUBINUR Negative 02/16/2018 1357   BILIRUBINUR Negative 04/20/2012 1725   KETONESUR Negative 04/20/2012 1725   PROTEINUR 1+ (A) 02/16/2018 1357   PROTEINUR Negative 04/20/2012 1725   NITRITE Negative 02/16/2018 1357   NITRITE Negative 04/20/2012 1725   LEUKOCYTESUR Negative 02/16/2018 1357   LEUKOCYTESUR Negative 04/20/2012 1725    Pertinent Imaging:   Assessment & Plan: Reassess for Botox once blood pressure is controlled: I will not bill for this visit  1. Mixed incontinence  - Urinalysis, Complete   No follow-ups on file.  Reece Packer, MD  [facility] [address] [place], [place] [address] [phone]

## 2018-02-25 ENCOUNTER — Other Ambulatory Visit: Payer: Self-pay | Admitting: Family Medicine

## 2018-02-25 DIAGNOSIS — I10 Essential (primary) hypertension: Secondary | ICD-10-CM

## 2018-03-02 ENCOUNTER — Telehealth: Payer: Self-pay | Admitting: Family Medicine

## 2018-03-02 ENCOUNTER — Encounter: Payer: Self-pay | Admitting: Family Medicine

## 2018-03-02 ENCOUNTER — Ambulatory Visit (INDEPENDENT_AMBULATORY_CARE_PROVIDER_SITE_OTHER): Payer: Medicaid Other | Admitting: Family Medicine

## 2018-03-02 ENCOUNTER — Other Ambulatory Visit: Payer: Self-pay | Admitting: Family Medicine

## 2018-03-02 VITALS — BP 138/88 | HR 85 | Temp 98.2°F | Resp 16 | Ht 59.0 in | Wt 216.0 lb

## 2018-03-02 DIAGNOSIS — Z720 Tobacco use: Secondary | ICD-10-CM | POA: Diagnosis not present

## 2018-03-02 DIAGNOSIS — I10 Essential (primary) hypertension: Secondary | ICD-10-CM | POA: Diagnosis not present

## 2018-03-02 DIAGNOSIS — J410 Simple chronic bronchitis: Secondary | ICD-10-CM

## 2018-03-02 DIAGNOSIS — F319 Bipolar disorder, unspecified: Secondary | ICD-10-CM | POA: Diagnosis not present

## 2018-03-02 DIAGNOSIS — E118 Type 2 diabetes mellitus with unspecified complications: Secondary | ICD-10-CM

## 2018-03-02 MED ORDER — LEVOFLOXACIN 500 MG PO TABS: 500.0000 mg | ORAL_TABLET | Freq: Every day | ORAL | 0 refills | Status: DC

## 2018-03-02 MED ORDER — ALBUTEROL SULFATE HFA 108 (90 BASE) MCG/ACT IN AERS: INHALATION_SPRAY | RESPIRATORY_TRACT | 2 refills | Status: DC

## 2018-03-02 MED ORDER — TIOTROPIUM BROMIDE MONOHYDRATE 18 MCG IN CAPS: 18.0000 ug | ORAL_CAPSULE | Freq: Every day | RESPIRATORY_TRACT | 5 refills | Status: DC

## 2018-03-02 MED ORDER — LOSARTAN POTASSIUM 50 MG PO TABS: 50.0000 mg | ORAL_TABLET | Freq: Every day | ORAL | 1 refills | Status: DC

## 2018-03-02 MED ORDER — PREDNISONE 50 MG PO TABS: 50.0000 mg | ORAL_TABLET | Freq: Every day | ORAL | 0 refills | Status: DC

## 2018-03-02 NOTE — Progress Notes (Signed)
Subjective:    Patient ID: Darlene Barber, female    DOB: 11-Nov-1965, 52 y.o.   MRN: [id number]  Darlene Barber is a 52 y.o. female presenting on 03/02/2018 for Hypertension (patient went to Urology for procedure and had  blood pressure approximately 160/107.  She was not symptomatic at  that time but recommomnded her to be seen by PCP ) and Cough (worst at night while lying down and gets SOB)   HPI   CHRONIC HTN: Reports recent history of elevated BP readings, she had come off all anti HTN therapy previously unsure which ones, except she remains on Propranolol ER 120mg  daily for migraine prophylaxis. Also remains on Clonidine 0.2mg  daily from Psych for tremors, not for HTN - She was advised to follow-up w/ PCP, review of prior readings and her report state SBP 140-150s, previously had been better controlled - also most recent up to 160/107 Reports good compliance, took meds today. Tolerating well, w/o complaints. Lifestyle: - Exercise: Limited Denies CP, HA, edema, dizziness / lightheadedness  Chronic Bronchitis type COPD / Tobacco Abuse Regarding symptoms now having dyspnea worse at night and cough seems productive worsening in past few weeks with some symptoms persist for months. Never had prior dx COPD. She is not on inhaler therapy maintenance. Never seen Pulm  - Former smoker, previous smoker for >30 years, quit back in 01/2016 - then restarted in 2018. Avg 0.5ppd. She has difficulty quitting since close contacts and family members also smoking in house. She has tried NRT patches in past without good results.  History of Bipolar Depression - followed by [facility], on med management currently, never been on Chantix or Wellbutrin. Has no called Quitline Before  Admits dyspnea cough  Depression screen [facility] 2/9 03/02/2018 11/26/2017 11/04/2017  Decreased Interest 0 0 0  Down, Depressed, Hopeless 0 0 0  PHQ - 2 Score 0 0 0  Altered sleeping - - -  Tired, decreased energy - - -   Change in appetite - - -  Feeling bad or failure about yourself  - - -  Trouble concentrating - - -  Moving slowly or fidgety/restless - - -  Suicidal thoughts - - -  PHQ-9 Score - - -  Difficult doing work/chores - - -    Social History   Tobacco Use  . Smoking status: Current Every Day Smoker    Packs/day: 0.50    Years: 30.00    Pack years: 15.00    Types: Cigarettes  . Smokeless tobacco: Never Used  Substance Use Topics  . Alcohol use: Yes    Comment: occ once per year  . Drug use: Yes    Types: Marijuana    Comment: Current use    Review of Systems Per HPI unless specifically indicated above     Objective:    BP 138/88 (BP Location: Left Arm, Cuff Size: Normal)   Pulse 85   Temp 98.2 F (36.8 C) (Oral)   Resp 16   Ht 4\' 11"  (1.499 m)   Wt 216 lb (98 kg)   BMI 43.63 kg/m   Wt Readings from Last 3 Encounters:  03/02/18 216 lb (98 kg)  02/12/18 212 lb (96.2 kg)  11/26/17 217 lb (98.4 kg)    Physical Exam   Recent Labs    05/26/17 1719 11/19/17 0812  HGBA1C 7.0* 6.4*    Results for orders placed or performed in visit on 02/23/18  Microscopic Examination  Result Value Ref Range  WBC, UA None seen 0 - 5 /hpf   RBC, UA 0-2 0 - 2 /hpf   Epithelial Cells (non renal) 0-10 0 - 10 /hpf   Mucus, UA Present (A) Not Estab.   Bacteria, UA Many (A) None seen/Few  Urinalysis, Complete  Result Value Ref Range   Specific Gravity, UA >1.030 (H) 1.005 - 1.030   pH, UA 6.0 5.0 - 7.5   Color, UA Yellow Yellow   Appearance Ur Clear Clear   Leukocytes, UA Negative Negative   Protein, UA Negative Negative/Trace   Glucose, UA Negative Negative   Ketones, UA Negative Negative   RBC, UA Negative Negative   Bilirubin, UA Negative Negative   Urobilinogen, Ur 0.2 0.2 - 1.0 mg/dL   Nitrite, UA Negative Negative   Microscopic Examination See below:       Assessment & Plan:   Problem List Items Addressed This Visit    Bipolar depression ([place])    Stable  currently on med management by [facility]  Asked her to discuss Wellbutrin vs Chantix w/ Psych regarding smoking cessation - consider if these would be appropriate w/ current meds.  Follow-up      Chronic bronchitis ([place]) - Primary    Consistent with persistent symptoms of likely COPD w/o formal diagnosis. Likely exacerbation  Plan: 1. Start Prednisone 50mg  x 5 day steroid burst 2. Start taking Levaquin antibiotic 500mg  daily x 7 days 3. Start Spiriva daily as maintenance 4. Referral to [facility] for further evaluation / diagnostic / and management 5. Use albuterol q 4 hr regularly x 2-3 days 6. Follow-up about 1 week if not improving, otherwise strict return criteria to go to ED       Relevant Medications   albuterol (PROVENTIL HFA) 108 (90 Base) MCG/ACT inhaler   tiotropium (SPIRIVA HANDIHALER) 18 MCG inhalation capsule   predniSONE (DELTASONE) 50 MG tablet   levofloxacin (LEVAQUIN) 500 MG tablet   Other Relevant Orders   Ambulatory referral to Pulmonology   Essential hypertension    Elevated BP mildly, recent abnormal elevated No known complication Self discontinued Enalapril in past  Plan 1. NEW start ARB Losartan 50mg  daily 2. Continue current dose  Propranolol from 120mg  daily (24 hr tab) for migraines, Clonidine 0.2mg  daily per Psych 3. Monitor BP outside office 4. Improve lifestyle, wt loss - Smoking cessation - quitline, and future ask psych about wellbutrin chantix 5. Follow-up 3 months w/ labs chemistry for ARB start      Relevant Medications   losartan (COZAAR) 50 MG tablet   Other Relevant Orders   BASIC METABOLIC PANEL WITH GFR   Tobacco abuse    Active smoker Ready to quit again Prior quit on NRT  Quitline # given again - now she plans to use it  Check w/ Psych if can use Wellbutrin vs Chantix  Discussion today >5 minutes (<10 minutes) specifically on counseling on risks of tobacco use, complications, treatment, smoking cessation.        Relevant Orders   Ambulatory referral to Pulmonology      Meds ordered this encounter  Medications  . albuterol (PROVENTIL HFA) 108 (90 Base) MCG/ACT inhaler    Sig: INHALE 2 PUFFS INTO THE LUNGS EVERY 4 HOURS AS NEEDED FOR WHEEZING OR SHORTNESS OF BREATH    Dispense:  6.7 g    Refill:  2    Maximum Refills Reached  . tiotropium (SPIRIVA HANDIHALER) 18 MCG inhalation capsule    Sig: Place 1 capsule (18  mcg total) into inhaler and inhale daily.    Dispense:  30 capsule    Refill:  5  . losartan (COZAAR) 50 MG tablet    Sig: Take 1 tablet (50 mg total) by mouth daily.    Dispense:  90 tablet    Refill:  1  . predniSONE (DELTASONE) 50 MG tablet    Sig: Take 1 tablet (50 mg total) by mouth daily with breakfast.    Dispense:  5 tablet    Refill:  0  . levofloxacin (LEVAQUIN) 500 MG tablet    Sig: Take 1 tablet (500 mg total) by mouth daily. For 7 days    Dispense:  7 tablet    Refill:  0    Orders Placed This Encounter  Procedures  . BASIC METABOLIC PANEL WITH GFR    Standing Status:   Future    Standing Expiration Date:   08/10/2018  . Ambulatory referral to Pulmonology    Referral Priority:   Routine    Referral Type:   Consultation    Referral Reason:   Specialty Services Required    Requested Specialty:   Pulmonary Disease    Number of Visits Requested:   1    Follow up plan: Return in about 3 months (around 06/01/2018) for 3 months keep apt in March 2020- ADD FASTING LAB 1 week before.  Future labs ordered for BMET and A1c 05/2018  [name], [facility] 03/02/2018, 9:35 AM

## 2018-03-02 NOTE — Telephone Encounter (Signed)
Patient notified that Dr. Raliegh Ip will unable to call Dr. Jacqualine Code until Friday since he is out of office till Friday.

## 2018-03-02 NOTE — Patient Instructions (Addendum)
Thank you for coming to the office today.  For breathing COPD flare - Start taking Levaquin antibiotic 500mg  daily x 7 days  Start Prednisone 50mg  daily for 5 days  ------------ Start with Losartan 50mg  daily - new medicine for BP - we will check blood before your next apt in March 2020.   Referral to Lung Specialist - stay tuned for initial apt  Start Spiriva - 1 capsule powder inhaled daily - call if not covered we can switch to alternative  Use Albuterol as rescue inhaler as needed  [facility] [address] [place], [place] Phone: [phone]  -----------------------------------  Please ask your [facility] about the following smoking medicine - Chantix (Varenicline), Wellbutrin (Zyban) - if they think one of these would be a good fit for you - you can notify our office and we can prescribe it.  Quitline  [phone]-QUIT NOW  ----------------------------------  Your provider would like to you have your annual eye exam. Please contact your current eye doctor or here are some good options for you to contact.   [facility]   Address: [address] [place], [place] [address] Phone: [phone]  Website: [email or web address] [address] [place], [place] [address] Phone: [phone] [email or web address]  [facility]  Address: [place], [place], [place] [address] Phone: [phone]   [facility] [address] [place], [place] [address] Phone: [phone]  [facility] Address: [place], [place],  [address]  Phone: [phone]  -----  DUE for [place] (no food or drink after midnight before the lab appointment, only water or coffee without cream/sugar on the morning of)  SCHEDULE "Lab Only" visit in the morning at the clinic for lab draw in 3 MONTHS   - Make sure Lab Only appointment is at about 1 week before your next appointment,  so that results will be available  For Lab Results, once available within 2-3 days of blood draw, you can can log in to MyChart online to view your results and a brief explanation. Also, we can discuss results at next follow-up visit.   Please schedule a Follow-up Appointment to: Return in about 3 months (around 06/01/2018) for 3 months keep apt in March 2020- ADD FASTING LAB 1 week before.  If you have any other questions or concerns, please feel free to call the office or send a message through [facility]. You may also schedule an earlier appointment if necessary.  Additionally, you may be receiving a survey about your experience at our office within a few days to 1 week by e-mail or mail. We value your feedback.  [name], DO [place]

## 2018-03-02 NOTE — Telephone Encounter (Signed)
Dr. Jacqualine Code at [facility] would not discuss which smoking cessation product would be best with the medication she's on.  She was told to have Dr. Raliegh Ip call and discuss with Dr. Jacqualine Code.  Her call back number is [phone]

## 2018-03-02 NOTE — Assessment & Plan Note (Signed)
Elevated BP mildly, recent abnormal elevated No known complication Self discontinued Enalapril in past  Plan 1. NEW start ARB Losartan 50mg  daily 2. Continue current dose  Propranolol from 120mg  daily (24 hr tab) for migraines, Clonidine 0.2mg  daily per Psych 3. Monitor BP outside office 4. Improve lifestyle, wt loss - Smoking cessation - quitline, and future ask psych about wellbutrin chantix 5. Follow-up 3 months w/ labs chemistry for ARB start

## 2018-03-03 NOTE — Assessment & Plan Note (Signed)
Consistent with persistent symptoms of likely COPD w/o formal diagnosis. Likely exacerbation  Plan: 1. Start Prednisone 50mg  x 5 day steroid burst 2. Start taking Levaquin antibiotic 500mg  daily x 7 days 3. Start Spiriva daily as maintenance 4. Referral to [facility] for further evaluation / diagnostic / and management 5. Use albuterol q 4 hr regularly x 2-3 days 6. Follow-up about 1 week if not improving, otherwise strict return criteria to go to ED

## 2018-03-03 NOTE — Assessment & Plan Note (Signed)
Active smoker Ready to quit again Prior quit on NRT  Quitline # given again - now she plans to use it  Check w/ Psych if can use Wellbutrin vs Chantix  Discussion today >5 minutes (<10 minutes) specifically on counseling on risks of tobacco use, complications, treatment, smoking cessation.

## 2018-03-03 NOTE — Assessment & Plan Note (Signed)
Stable currently on med management by [facility]  Asked her to discuss Wellbutrin vs Chantix w/ Psych regarding smoking cessation - consider if these would be appropriate w/ current meds.  Follow-up

## 2018-03-06 NOTE — Telephone Encounter (Signed)
Called [facility] - did not reach Dr Jacqualine Code or clinical staff, I left a voice message for them, explaining that we were asked to call to discuss which med option is best for smoking cessation for this mutual patient, awaiting return call.  [name], [facility] 03/06/2018, 12:29 PM

## 2018-03-15 NOTE — Progress Notes (Deleted)
[name] Name: Darlene Barber  MRN: [id number]  Referring Provider: Nobie Barber *  DOB: 1965/11/08  PCP: Darlene Hauser, DO  DOS: 03/16/2018  Note by: Darlene Cola, MD  Service setting: Ambulatory outpatient  Specialty: Interventional Pain Management  Location: [facility] (AMB) Pain Management Facility    Patient type: Established   Primary Reason(s) for Visit: Encounter for evaluation before starting new chronic pain management plan of care (Level of risk: moderate) CC: No chief complaint on file.  HPI  Darlene Barber is a 53 y.o. year old, female patient, who comes today for a follow-up evaluation to review the test results and decide on a treatment plan. She has Numbness and tingling; Bipolar depression (Darlene Barber); Gastroesophageal reflux disease without esophagitis; Flatulence, eructation and gas pain; Insomnia; Essential hypertension; Morbid obesity with BMI of 40.0-44.9, adult (Darlene Barber); Chronic bronchitis (Darlene Barber); Tobacco abuse; Post menopausal syndrome; Controlled type 2 diabetes mellitus with complication, without long-term current use of insulin (Darlene Barber); Hot flashes; Mixed incontinence urge and stress; Encounter for screening colonoscopy; Hyperlipidemia associated with type 2 diabetes mellitus (Darlene Barber); Abdominal bloating; Atrophy of vagina; Drug-induced tremor; Dyspareunia; Migraines; Cervicalgia; Obstructive sleep apnea (adult) (pediatric); Right leg swelling; Seasonal allergies; Tremor; Trochanteric bursitis of hip (Right); Chronic neck pain (Primary Area of Pain) (Bilateral) (L>R); Chronic hip pain (Tertiary Area of Pain) (Bilateral) (R>L); Chronic lower extremity pain (Fourth Area of Pain) (Right); Chronic pain syndrome; Opiate use; Pharmacologic therapy; Disorder of skeletal system; Problems influencing health status; Chronic upper extremity pain (Secondary Area of Pain) (Bilateral) (L>R); Primary osteoarthritis involving multiple joints; Vitamin D deficiency; Cervicogenic headache;  Cervical facet hypertrophy; Cervical facet syndrome (Bilateral) (L>R); 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 Her primarily concern today is the No chief complaint on file.  Pain Assessment: Location:     Radiating:   Onset:   Duration:   Quality:   Severity:  /10 (subjective, self-reported pain score)  Note: Reported level is compatible with observation.                         When using our objective Pain Scale, levels between 6 and 10/10 are said to belong in an emergency room, as it progressively worsens from a 6/10, described as severely limiting, requiring emergency care not usually available at an outpatient pain management facility. At a 6/10 level, communication becomes difficult and requires great effort. Assistance to reach the emergency department may be required. Facial flushing and profuse sweating along with potentially dangerous increases in heart rate and blood pressure will be evident. Effect on ADL:   Timing:   Modifying factors:   BP:    HR:    Darlene Barber comes in today for a follow-up visit after her initial evaluation on 02/12/2018. Today we went over the results of her tests. These were explained in "Layman's terms". During today's appointment we went over my diagnostic impression, as well as the proposed treatment plan.  According to the patient her primary area of pain is in her neck (B)(L>R).  She admits that the left side is greater than the right.  She denies any previous accident or injury.  She denies any previous surgery.  Admits that she has had some epidural steroid injections by Darlene Barber which were effective.  She denies any formal physical therapy but  does do some home exercises.  She has had recent  images.  Second area of pain is in her arms (B)(L>R).  She admits the left is greater than the right.  He has numbness in fingers 4 and 5 on the left hand.  She admits that she does have occasional tingling of the entire hand which is positional.  She admits the right arm pain comes and goes and the pain normally only down into the forearm.  Admits that she does have weakness.  She did have a nerve conduction study.  (EMG/PNCV performed by Darlene Barber on 10/08/2017.  Negative study for left upper extremity.)  Third area pain is in her hips (B)(R>L).  She admits that the right is greater than the left.  She denies any previous surgery.  She does receive occasional injections by Darlene Barber last being 2018.  She admits that they are effective.  She denies any physical therapy or recent images.  Her last area of pain is in her leg (R).  She admits the pain goes down the side of her leg into the knee. She admits that this is worse at night when she lies on her right side.  She denies any left leg pain.  She admits that she was sent here by neurology because of her ongoing treatment with Lyrica.  She also admits that she uses marijuana to help with her tremors.  In considering the treatment plan options, Darlene Barber was reminded that I no longer take patients for medication management only. I asked her to let me know if she had no intention of taking advantage of the interventional therapies, so that we could make arrangements to provide this space to someone interested. I also made it clear that undergoing interventional therapies for the purpose of getting pain medications is very inappropriate on the part of a patient, and it will not be tolerated in this practice. This type of behavior would suggest true addiction and therefore it requires referral to an addiction specialist.   Further details on both, my assessment(s), as well as the proposed treatment plan, please see below.  Controlled  Substance Pharmacotherapy Assessment REMS (Risk Evaluation and Mitigation Strategy)  Analgesic: None Highest recorded MME/day: 10 mg/day MME/day: 0 mg/day  Pill Count: None expected due to no prior prescriptions written by our practice. No notes on file Pharmacokinetics: Liberation and absorption (onset of action): WNL Distribution (time to peak effect): WNL Metabolism and excretion (duration of action): WNL         Pharmacodynamics: Desired effects: Analgesia: Ms. Fedder reports >50% benefit. Functional ability: Patient reports that medication allows her to accomplish basic ADLs Clinically meaningful improvement in function (CMIF): Sustained CMIF goals met Perceived effectiveness: Described as relatively effective, allowing for increase in activities of daily living (ADL) Undesirable effects: Side-effects or Adverse reactions: None reported Monitoring: [place] PMP: Online review of the past 106-monthperiod previously conducted. Not applicable at this point since we have not taken over the patient's medication management yet. List of other Serum/Urine Drug Screening Test(s):  Lab Results  Component Value Date   COCAINSCRNUR NONE DETECTED 11/20/2017   COCAINSCRNUR NEGATIVE 04/20/2012   COCAINSCRNUR POSITIVE 05/11/2011   THCU POSITIVE (A) 11/20/2017   THCU POSITIVE 04/20/2012   THCU POSITIVE 05/11/2011   List of all UDS test(s) done:  Lab Results  Component Value Date   SUMMARY FINAL 02/12/2018   Last UDS on record: Summary  Date Value Ref Range Status  02/12/2018 FINAL  Final    Comment:    ==================================================================== TOXASSURE COMP DRUG ANALYSIS,UR ==================================================================== Test                             Result       Flag       Units Drug Present and Declared for Prescription Verification   Pregabalin                     PRESENT      EXPECTED   Cyclobenzaprine                PRESENT       EXPECTED   Desmethylcyclobenzaprine       PRESENT      EXPECTED    Desmethylcyclobenzaprine is an expected metabolite of    cyclobenzaprine.   Nortriptyline                  PRESENT      EXPECTED    Nortriptyline may be administered as a prescription drug; it is    also an expected metabolite of amitriptyline.   Lurasidone                     PRESENT      EXPECTED   Clonidine                      PRESENT      EXPECTED Drug Present not Declared for Prescription Verification   Carboxy-THC                    >529         EXPECTED ng/mg creat    Carboxy-THC is a metabolite of tetrahydrocannabinol  (THC).    Source of [facility] is most commonly illicit, but THC is also present    in a scheduled prescription medication.   Dextromethorphan               PRESENT      UNEXPECTED   Dextrorphan/Levorphanol        PRESENT      UNEXPECTED    Dextrorphan is an expected metabolite of dextromethorphan, an    over-the-counter or prescription cough suppressant. Dextrorphan    cannot be distinguished from the scheduled prescription    medication levorphanol by the method used for analysis.   Guaifenesin                    PRESENT      UNEXPECTED    Guaifenesin may be administered as an over-the-counter or    prescription drug; it may also be present as a breakdown product    of methocarbamol.   Propranolol                    PRESENT      UNEXPECTED Drug Absent but Declared for Prescription Verification   Benztropine                    Not Detected UNEXPECTED ==================================================================== Test                      Result    Flag   Units      Ref Range   Creatinine              189  mg/dL      >=20 ==================================================================== Declared Medications:  The flagging and interpretation on this report are based on the  following declared medications.  Unexpected results may arise from  inaccuracies in the declared  medications.  **Note: The testing scope of this panel includes these medications:  Benztropine (Cogentin)  Clonidine (Catapres)  Cyclobenzaprine (Flexeril)  Lurasidone (Latuda)  Nortriptyline (Pamelor)  Pregabalin (Lyrica)  **Note: The testing scope of this panel does not include following  reported medications:  Albuterol (Proventil)  Clobetasol (Temovate)  Divalproex (Depakote)  Estradiol (Estrace)  Etodolac (Lodine)  Exenatide (Bydureon)  Fluticasone (Flonase)  Melatonin  Omeprazole (Prilosec) ==================================================================== For clinical consultation, please call [phone]. ====================================================================    UDS interpretation: Unexpected findings not considered significantly abnormal. Illicit substance (THC) detected Medication Assessment Form: Not applicable. No opioids. Treatment compliance: Not applicable Risk Assessment Profile: Aberrant behavior: use of illicit substances Comorbid factors increasing risk of overdose: age 69-69 years old, bipolar disorder, history of substance use disorder and nicotine dependence Opioid risk tool (ORT) (Total Score):   Personal History of Substance Abuse (SUD-Substance use disorder):  Alcohol:    Illegal Drugs:    Rx Drugs:    ORT Risk Level calculation:   Risk of substance use disorder (SUD): High  ORT Scoring interpretation table:  Score <3 = Low Risk for SUD  Score between 4-7 = Moderate Risk for SUD  Score >8 = High Risk for Opioid Abuse   Risk Mitigation Strategies:  Patient opioid safety counseling: No controlled substances prescribed. Patient-Prescriber Agreement (PPA): No agreement signed.  Controlled substance notification to other providers: None required. No opioid therapy.  Pharmacologic Plan: Non-opioid analgesic therapy offered.             Laboratory Chemistry  Inflammation Markers (CRP: Acute Phase) (ESR: Chronic Phase) Lab  Results  Component Value Date   CRP 9 02/12/2018   ESRSEDRATE 22 02/12/2018                         Rheumatology Markers No results found.  Renal Function Markers Lab Results  Component Value Date   BUN 16 11/19/2017   CREATININE 0.69 47/11/6281   BCR NOT APPLICABLE 66/29/4765   GFRAA 117 11/19/2017   GFRNONAA 101 11/19/2017                             Hepatic Function Markers Lab Results  Component Value Date   AST 13 11/19/2017   ALT 11 11/19/2017   ALBUMIN 3.7 11/12/2016   ALKPHOS 73 11/12/2016                        Electrolytes Lab Results  Component Value Date   NA 140 11/19/2017   K 4.0 11/19/2017   CL 104 11/19/2017   CALCIUM 9.1 11/19/2017   MG 1.6 02/12/2018                        Neuropathy Markers Lab Results  Component Value Date   VITAMINB12 430 11/19/2017   FOLATE 8.8 11/19/2017   HGBA1C 6.4 (H) 11/19/2017                        CNS Tests No results found.  Bone Pathology Markers Lab Results  Component Value Date   VD25OH 32 11/12/2016   25OHVITD1 19 (L) 02/12/2018  25OHVITD2 <1.0 02/12/2018   25OHVITD3 18 02/12/2018                         Coagulation Parameters Lab Results  Component Value Date   PLT 306 11/19/2017                        Cardiovascular Markers Lab Results  Component Value Date   HGB 14.4 11/19/2017   HCT 41.3 11/19/2017                         CA Markers No results found.  Note: Lab results reviewed.  Recent Diagnostic Imaging Review  Cervical Imaging: Cervical MR wo contrast:  Results for orders placed during the hospital encounter of 12/30/16  MR CERVICAL SPINE WO CONTRAST   Narrative CLINICAL DATA:  53 y/o F; left-sided neck pain radiating to the left upper extremity.  EXAM: MRI CERVICAL SPINE WITHOUT CONTRAST  TECHNIQUE: Multiplanar, multisequence MR imaging of the cervical spine was performed. No intravenous contrast was administered.  COMPARISON:  05/14/2005 cervical  MRI.  FINDINGS: Alignment: Physiologic.  Vertebrae: No fracture, evidence of discitis, or bone lesion.  Cord: Normal signal and morphology.  Posterior Fossa, vertebral arteries, paraspinal tissues: Negative.  Disc levels:  C2-3: No significant disc displacement, foraminal stenosis, or canal stenosis.  C3-4: No significant disc displacement, foraminal stenosis, or canal stenosis.  C4-5: Minimal disc osteophyte complex with left-sided uncovertebral and facet hypertrophy. Mild left foraminal stenosis. No significant canal stenosis.  C5-6: Diffuse disc osteophyte complex with left-greater-than-right uncovertebral and facet hypertrophy. Moderate right and severe left foraminal stenosis. Mild canal stenosis with ventral thecal sac effacement and minimal right anterior cord flattening.  C6-7: Disc osteophyte complex with left-greater-than-right uncovertebral and facet hypertrophy. Mild right and moderate left foraminal stenosis. No significant canal stenosis.  C7-T1: No significant disc displacement, foraminal stenosis, or canal stenosis.  IMPRESSION: 1. No acute osseous abnormality or abnormal cord signal. 2. Progression of cervical spondylosis at the C4-5 through C6-7 levels. 3. Mild C5-6 canal stenosis.  No high-grade canal stenosis. 4. C4-C7 mild and moderate foraminal stenosis. Severe left C5-6 foraminal stenosis.   Electronically Signed   By: Kristine Garbe M.D.   On: 12/31/2016 02:36    Hip Imaging: Hip-R DG 2-3 views:  Results for orders placed during the hospital encounter of 02/12/18  DG HIP UNILAT W OR W/O PELVIS 2-3 VIEWS RIGHT   Narrative CLINICAL DATA:  Bursitis  EXAM: DG HIP (WITH OR WITHOUT PELVIS) 2-3V RIGHT  COMPARISON:  None.  FINDINGS: No acute fracture. No dislocation.  Unremarkable soft tissues.  IMPRESSION: No acute bony pathology.   Electronically Signed   By: Marybelle Killings M.D.   On: 02/13/2018 07:59    Hip-L DG 2-3  views:  Results for orders placed during the hospital encounter of 02/12/18  DG HIP UNILAT W OR W/O PELVIS 2-3 VIEWS LEFT   Narrative CLINICAL DATA:  Bursitis  EXAM: DG HIP (WITH OR WITHOUT PELVIS) 2-3V LEFT  COMPARISON:  None.  FINDINGS: No acute fracture. No dislocation. Unremarkable soft tissues. Mild degenerative change of the hip joints.  IMPRESSION: No acute bony pathology.   Electronically Signed   By: Marybelle Killings M.D.   On: 02/13/2018 08:02    Complexity Note: Imaging results reviewed. Results shared with Ms. Damita Dunnings, using Layman's terms.  Meds   Current Outpatient Medications:  .  albuterol (PROVENTIL HFA) 108 (90 Base) MCG/ACT inhaler, INHALE 2 PUFFS INTO THE LUNGS EVERY 4 HOURS AS NEEDED FOR WHEEZING OR SHORTNESS OF BREATH, Disp: 6.7 g, Rfl: 2 .  benztropine (COGENTIN) 0.5 MG tablet, Take 0.5 mg by mouth 2 (two) times daily., Disp: , Rfl:  .  BYDUREON 2 MG PEN, INJECT 2 MG INTO THE SKIN ONCE A WEEK., Disp: 4 each, Rfl: 5 .  clobetasol cream (TEMOVATE) 0.05 %, Frequency:BID   Dosage:0.0     Instructions:  Note:Dose: 0.05 %, Disp: , Rfl:  .  cloNIDine (CATAPRES) 0.2 MG tablet, Take 0.2 mg by mouth daily., Disp: , Rfl:  .  cyclobenzaprine (FLEXERIL) 10 MG tablet, Take 10 mg by mouth 3 (three) times daily as needed for muscle spasms., Disp: , Rfl:  .  divalproex (DEPAKOTE) 250 MG DR tablet, Take 250 mg by mouth 3 (three) times daily., Disp: , Rfl:  .  estradiol (ESTRACE) 1 MG tablet, TAKE 1 TABLET BY MOUTH DAILY, Disp: 30 tablet, Rfl: 12 .  etodolac (LODINE) 400 MG tablet, Take 400 mg by mouth 2 (two) times daily., Disp: , Rfl:  .  fluticasone (FLONASE) 50 MCG/ACT nasal spray, Place 1 spray into both nostrils daily., Disp: 16 g, Rfl: 3 .  levofloxacin (LEVAQUIN) 500 MG tablet, Take 1 tablet (500 mg total) by mouth daily. For 7 days, Disp: 7 tablet, Rfl: 0 .  losartan (COZAAR) 50 MG tablet, Take 1 tablet (50 mg total) by mouth daily., Disp: 90  tablet, Rfl: 1 .  lurasidone (LATUDA) 80 MG TABS tablet, Take 80 mg by mouth., Disp: , Rfl:  .  Melatonin 5 MG CAPS, Take by mouth., Disp: , Rfl:  .  nitrofurantoin, macrocrystal-monohydrate, (MACROBID) 100 MG capsule, Take 1 capsule (100 mg total) by mouth every 12 (twelve) hours., Disp: 14 capsule, Rfl: 0 .  nortriptyline (PAMELOR) 25 MG capsule, Take 25 mg by mouth at bedtime., Disp: , Rfl:  .  omeprazole (PRILOSEC) 40 MG capsule, TAKE 1 CAPSULE BY MOUTH ONCE DAILY. TAKE 30 MINUTES PRIOR TO BREAKFAST, Disp: 30 capsule, Rfl: 5 .  predniSONE (DELTASONE) 50 MG tablet, Take 1 tablet (50 mg total) by mouth daily with breakfast., Disp: 5 tablet, Rfl: 0 .  pregabalin (LYRICA) 150 MG capsule, Take by mouth., Disp: , Rfl:  .  promethazine (PHENERGAN) 25 MG tablet, TAKE 1 TABLET BY MOUTH EVERY SIX HOURS AS NEEDED FOR NAUSEA VOMITING, Disp: 30 tablet, Rfl: 2 .  propranolol ER (INDERAL LA) 120 MG 24 hr capsule, TAKE 1 CAPSULE (120 MG TOTAL) BY MOUTH DAILY., Disp: 30 capsule, Rfl: 5 .  ranitidine (ZANTAC) 150 MG capsule, Take 150 mg by mouth., Disp: , Rfl:  .  senna (SENOKOT) 8.6 MG TABS tablet, Take 1 tablet by mouth daily., Disp: , Rfl:  .  tiotropium (SPIRIVA HANDIHALER) 18 MCG inhalation capsule, Place 1 capsule (18 mcg total) into inhaler and inhale daily., Disp: 30 capsule, Rfl: 5 .  valACYclovir (VALTREX) 1000 MG tablet, TAKE 1 TABLET BY MOUTH DAILY, Disp: 30 tablet, Rfl: 3  ROS  Constitutional: Denies any fever or chills Gastrointestinal: No reported hemesis, hematochezia, vomiting, or acute GI distress Musculoskeletal: Denies any acute onset joint swelling, redness, loss of ROM, or weakness Neurological: No reported episodes of acute onset apraxia, aphasia, dysarthria, agnosia, amnesia, paralysis, loss of coordination, or loss of consciousness  Allergies  Ms. Natarajan is allergic to piper and tape.  PFSH  Drug: Ms.  Melchior  reports current drug use. Drug: Marijuana. Alcohol:  reports current  alcohol use. Tobacco:  reports that she has been smoking cigarettes. She has a 15.00 pack-year smoking history. She has never used smokeless tobacco. Medical:  has a past medical history of Allergy, Anxiety, Bursitis of both hips, Depression, Frequent headaches, Obesity, and Sleep apnea. Surgical: Ms. Cashatt  has a past surgical history that includes Cholecystectomy; Cesarean section; Hysteroscopy; Hysterectomy abdominal with salpingectomy (1998); and Colonoscopy with propofol (N/A, 11/20/2017). Family: family history includes Alzheimer's disease in her maternal grandfather; Cancer in an other family member; Diabetes in her maternal aunt and maternal uncle; Emphysema in her paternal grandmother; Heart failure in her maternal grandmother; Hodgkin's lymphoma in her mother.  Constitutional Exam  General appearance: Well nourished, well developed, and well hydrated. In no apparent acute distress There were no vitals filed for this visit. BMI Assessment: Estimated body mass index is 43.63 kg/m as calculated from the following:   Height as of 03/02/18: '4\' 11"'  (1.499 m).   Weight as of 03/02/18: 216 lb (98 kg).  BMI interpretation table: BMI level Category Range association with higher incidence of chronic pain  <18 kg/m2 Underweight   18.5-24.9 kg/m2 Ideal body weight   25-29.9 kg/m2 Overweight Increased incidence by 20%  30-34.9 kg/m2 Obese (Class I) Increased incidence by 68%  35-39.9 kg/m2 Severe obesity (Class II) Increased incidence by 136%  >40 kg/m2 Extreme obesity (Class III) Increased incidence by 254%   [name] current BMI Ideal Body weight  There is no height or weight on file to calculate BMI. Patient must be at least 60 in tall to calculate ideal body weight   BMI Readings from Last 4 Encounters:  03/02/18 43.63 kg/m  02/12/18 42.82 kg/m  11/26/17 42.38 kg/m  11/20/17 44.23 kg/m   Wt Readings from Last 4 Encounters:  03/02/18 216 lb (98 kg)  02/12/18 212 lb (96.2 kg)   11/26/17 217 lb (98.4 kg)  11/20/17 219 lb (99.3 kg)  Psych/Mental status: Alert, oriented x 3 (person, place, & time)       Eyes: PERLA Respiratory: No evidence of acute respiratory distress  Cervical Spine Area Exam  Skin & Axial Inspection: No masses, redness, edema, swelling, or associated skin lesions Alignment: Symmetrical Functional ROM: Unrestricted ROM      Stability: No instability detected Muscle Tone/Strength: Functionally intact. No obvious neuro-muscular anomalies detected. Sensory (Neurological): Unimpaired Palpation: No palpable anomalies              Upper Extremity (UE) Exam    Side: Right upper extremity  Side: Left upper extremity  Skin & Extremity Inspection: Skin color, temperature, and hair growth are WNL. No peripheral edema or cyanosis. No masses, redness, swelling, asymmetry, or associated skin lesions. No contractures.  Skin & Extremity Inspection: Skin color, temperature, and hair growth are WNL. No peripheral edema or cyanosis. No masses, redness, swelling, asymmetry, or associated skin lesions. No contractures.  Functional ROM: Unrestricted ROM          Functional ROM: Unrestricted ROM          Muscle Tone/Strength: Functionally intact. No obvious neuro-muscular anomalies detected.  Muscle Tone/Strength: Functionally intact. No obvious neuro-muscular anomalies detected.  Sensory (Neurological): Unimpaired          Sensory (Neurological): Unimpaired          Palpation: No palpable anomalies              Palpation: No palpable anomalies  Provocative Test(s):  Phalen's test: deferred Tinel's test: deferred Apley's scratch test (touch opposite shoulder):  Action 1 (Across chest): deferred Action 2 (Overhead): deferred Action 3 (LB reach): deferred   Provocative Test(s):  Phalen's test: deferred Tinel's test: deferred Apley's scratch test (touch opposite shoulder):  Action 1 (Across chest): deferred Action 2 (Overhead): deferred Action 3 (LB  reach): deferred    Thoracic Spine Area Exam  Skin & Axial Inspection: No masses, redness, or swelling Alignment: Symmetrical Functional ROM: Unrestricted ROM Stability: No instability detected Muscle Tone/Strength: Functionally intact. No obvious neuro-muscular anomalies detected. Sensory (Neurological): Unimpaired Muscle strength & Tone: No palpable anomalies  Lumbar Spine Area Exam  Skin & Axial Inspection: No masses, redness, or swelling Alignment: Symmetrical Functional ROM: Unrestricted ROM       Stability: No instability detected Muscle Tone/Strength: Functionally intact. No obvious neuro-muscular anomalies detected. Sensory (Neurological): Unimpaired Palpation: No palpable anomalies       Provocative Tests: Hyperextension/rotation test: deferred today       Lumbar quadrant test (Kemp's test): deferred today       Lateral bending test: deferred today       Patrick's Maneuver: deferred today                   FABER* test: deferred today                   S-I anterior distraction/compression test: deferred today         S-I lateral compression test: deferred today         S-I Thigh-thrust test: deferred today         S-I Gaenslen's test: deferred today         *(Flexion, ABduction and External Rotation)  Gait & Posture Assessment  Ambulation: Unassisted Gait: Relatively normal for age and body habitus Posture: WNL   Lower Extremity Exam    Side: Right lower extremity  Side: Left lower extremity  Stability: No instability observed          Stability: No instability observed          Skin & Extremity Inspection: Skin color, temperature, and hair growth are WNL. No peripheral edema or cyanosis. No masses, redness, swelling, asymmetry, or associated skin lesions. No contractures.  Skin & Extremity Inspection: Skin color, temperature, and hair growth are WNL. No peripheral edema or cyanosis. No masses, redness, swelling, asymmetry, or associated skin lesions. No contractures.   Functional ROM: Unrestricted ROM                  Functional ROM: Unrestricted ROM                  Muscle Tone/Strength: Functionally intact. No obvious neuro-muscular anomalies detected.  Muscle Tone/Strength: Functionally intact. No obvious neuro-muscular anomalies detected.  Sensory (Neurological): Unimpaired        Sensory (Neurological): Unimpaired        DTR: Patellar: deferred today Achilles: deferred today Plantar: deferred today  DTR: Patellar: deferred today Achilles: deferred today Plantar: deferred today  Palpation: No palpable anomalies  Palpation: No palpable anomalies   Assessment & Plan  Primary Diagnosis & Pertinent Problem List: The primary encounter diagnosis was Chronic pain syndrome. Diagnoses of Chronic neck pain (Primary Area of Pain) (Bilateral) (L>R), DDD (degenerative disc disease), cervical, Cervicalgia, Cervical facet hypertrophy, Cervical facet syndrome (Bilateral) (L>R), Cervicogenic headache, Chronic upper extremity pain (Secondary Area of Pain) (Bilateral) (L>R), Cervical central spinal stenosis (C5-6), Cervical foraminal stenosis (  C4-C7) (Bilateral) (L>R), Cervical radiculitis (Bilateral) (L>R), Cervical spondylitis w/ radiculitis (HCC), Chronic hip pain (Tertiary Area of Pain) (Bilateral) (R>L), Osteoarthritis of hips (Bilateral), Trochanteric bursitis of hip (Right), Chronic lower extremity pain (Fourth Area of Pain) (Right), Disorder of skeletal system, Pharmacologic therapy, Marijuana use, Problems influencing health status, Vitamin D deficiency, and Tobacco abuse were also pertinent to this visit.  Visit Diagnosis: 1. Chronic pain syndrome   2. Chronic neck pain (Primary Area of Pain) (Bilateral) (L>R)   3. DDD (degenerative disc disease), cervical   4. Cervicalgia   5. Cervical facet hypertrophy   6. Cervical facet syndrome (Bilateral) (L>R)   7. Cervicogenic headache   8. Chronic upper extremity pain (Secondary Area of Pain) (Bilateral) (L>R)   9.  Cervical central spinal stenosis (C5-6)   10. Cervical foraminal stenosis (C4-C7) (Bilateral) (L>R)   11. Cervical radiculitis (Bilateral) (L>R)   12. Cervical spondylitis w/ radiculitis (HCC)   13. Chronic hip pain (Tertiary Area of Pain) (Bilateral) (R>L)   14. Osteoarthritis of hips (Bilateral)   15. Trochanteric bursitis of hip (Right)   16. Chronic lower extremity pain (Fourth Area of Pain) (Right)   17. Disorder of skeletal system   18. Pharmacologic therapy   19. Marijuana use   20. Problems influencing health status   21. Vitamin D deficiency   22. Tobacco abuse    Problems updated and reviewed during this visit: Problem  Chronic upper extremity pain (Secondary Area of Pain) (Bilateral) (L>R)  Primary Osteoarthritis Involving Multiple Joints  Cervicogenic Headache  Cervical facet hypertrophy   C4-5: left-sided uncovertebral and facet hypertrophy. C5-6: left-greater-than-right uncovertebral and facet hypertrophy.  C6-7: left-greater-than-right uncovertebral and facet hypertrophy.   Cervical facet syndrome (Bilateral) (L>R)  Cervical foraminal stenosis (C4-C7) (Bilateral) (L>R)   C4-5: Mild left foraminal stenosis. C5-6: Moderate right and severe left foraminal stenosis. C6-7: Mild right and moderate left foraminal stenosis.  IMPRESSION: C4-C7 mild and moderate foraminal stenosis. Severe left C5-6 foraminal stenosis.    Cervical central spinal stenosis (C5-6)   C5-6: Mild canal stenosis with ventral thecal sac effacement and minimal right anterior cord flattening.   Cervical spondylitis w/ radiculitis (HCC)  Cervical radiculitis (Bilateral) (L>R)  Osteoarthritis of hips (Bilateral)  Ddd (Degenerative Disc Disease), Cervical  Chronic neck pain (Primary Area of Pain) (Bilateral) (L>R)  Chronic hip pain (Tertiary Area of Pain) (Bilateral) (R>L)  Chronic lower extremity pain (Fourth Area of Pain) (Right)  Cervicalgia  Trochanteric bursitis of hip (Right)  Vitamin D  Deficiency  Marijuana Use    Plan of Care  Pharmacotherapy (Medications Ordered): No orders of the defined types were placed in this encounter.  Procedure Orders    No procedure(s) ordered today   Lab Orders  No laboratory test(s) ordered today   Imaging Orders  No imaging studies ordered today   Referral Orders  No referral(s) requested today    Pharmacological management options:  Opioid Analgesics: I will not be prescribing any opioids at this time Membrane stabilizer: We have discussed the possibility of optimizing this mode of therapy, if tolerated Muscle relaxant: We have discussed the possibility of a trial NSAID: We have discussed the possibility of a trial Other analgesic(s): To be determined at a later time   Interventional management options: Planned, scheduled, and/or pending:    Diagnostic bilateral cervical facet block #1 under fluoroscopic guidance and IV sedation   Considering:   Diagnostic bilateral cervical facet block  Possible bilateral cervical facet RFA  Diagnostic left-sided cervical ESI  Diagnostic bilateral intra-articular hip joint injection  Diagnostic bilateral femoral nerve and obturator nerve blocks  Possible bilateral femoral nerve and obturator nerve RFA  Diagnostic right-sided trochanteric bursa injection    PRN Procedures:   None at this time   Provider-requested follow-up: No follow-ups on file.  Future Appointments  Date Time Provider [place]  03/16/2018 11:30 AM Milinda Pointer, MD [facility] None  05/22/2018  9:15 AM [facility] NURSE [place] None  05/27/2018  2:20 PM Karamalegos, Devonne Doughty, DO [facility] None    Primary Care Physician: Darlene Hauser, DO Location: [facility] Note by: Darlene Cola, MD Date: 03/16/2018; Time: 8:00 AM

## 2018-03-16 ENCOUNTER — Ambulatory Visit: Payer: Medicaid Other | Admitting: Pain Medicine

## 2018-03-16 DIAGNOSIS — M4802 Spinal stenosis, cervical region: Secondary | ICD-10-CM | POA: Insufficient documentation

## 2018-03-16 DIAGNOSIS — M79601 Pain in right arm: Secondary | ICD-10-CM

## 2018-03-16 DIAGNOSIS — M5412 Radiculopathy, cervical region: Secondary | ICD-10-CM | POA: Insufficient documentation

## 2018-03-16 DIAGNOSIS — M150 Primary generalized (osteo)arthritis: Secondary | ICD-10-CM

## 2018-03-16 DIAGNOSIS — M4692 Unspecified inflammatory spondylopathy, cervical region: Secondary | ICD-10-CM | POA: Insufficient documentation

## 2018-03-16 DIAGNOSIS — E559 Vitamin D deficiency, unspecified: Secondary | ICD-10-CM | POA: Insufficient documentation

## 2018-03-16 DIAGNOSIS — M160 Bilateral primary osteoarthritis of hip: Secondary | ICD-10-CM | POA: Insufficient documentation

## 2018-03-16 DIAGNOSIS — F1290 Cannabis use, unspecified, uncomplicated: Secondary | ICD-10-CM | POA: Insufficient documentation

## 2018-03-16 DIAGNOSIS — M5030 Other cervical disc degeneration, unspecified cervical region: Secondary | ICD-10-CM | POA: Insufficient documentation

## 2018-03-16 DIAGNOSIS — G8929 Other chronic pain: Secondary | ICD-10-CM | POA: Insufficient documentation

## 2018-03-16 DIAGNOSIS — G4486 Cervicogenic headache: Secondary | ICD-10-CM | POA: Insufficient documentation

## 2018-03-16 DIAGNOSIS — R51 Headache: Secondary | ICD-10-CM

## 2018-03-16 DIAGNOSIS — M159 Polyosteoarthritis, unspecified: Secondary | ICD-10-CM | POA: Insufficient documentation

## 2018-03-16 DIAGNOSIS — M47812 Spondylosis without myelopathy or radiculopathy, cervical region: Secondary | ICD-10-CM | POA: Insufficient documentation

## 2018-03-16 DIAGNOSIS — M79602 Pain in left arm: Secondary | ICD-10-CM

## 2018-04-03 DIAGNOSIS — E119 Type 2 diabetes mellitus without complications: Secondary | ICD-10-CM | POA: Diagnosis not present

## 2018-04-03 DIAGNOSIS — H5203 Hypermetropia, bilateral: Secondary | ICD-10-CM | POA: Diagnosis not present

## 2018-04-08 ENCOUNTER — Ambulatory Visit (INDEPENDENT_AMBULATORY_CARE_PROVIDER_SITE_OTHER): Payer: Medicaid Other | Admitting: Internal Medicine

## 2018-04-08 ENCOUNTER — Ambulatory Visit
Admission: RE | Admit: 2018-04-08 | Discharge: 2018-04-08 | Disposition: A | Discharge status: Home / Self Care | Payer: Medicaid Other | Source: Ambulatory Visit | Attending: Internal Medicine | Admitting: Internal Medicine

## 2018-04-08 ENCOUNTER — Encounter: Payer: Self-pay | Admitting: Internal Medicine

## 2018-04-08 VITALS — BP 120/80 | HR 91 | Resp 16 | Ht 59.0 in | Wt 216.0 lb

## 2018-04-08 DIAGNOSIS — R0609 Other forms of dyspnea: Secondary | ICD-10-CM

## 2018-04-08 DIAGNOSIS — R1319 Other dysphagia: Secondary | ICD-10-CM

## 2018-04-08 DIAGNOSIS — R0602 Shortness of breath: Secondary | ICD-10-CM | POA: Diagnosis not present

## 2018-04-08 DIAGNOSIS — G4733 Obstructive sleep apnea (adult) (pediatric): Secondary | ICD-10-CM

## 2018-04-08 DIAGNOSIS — R05 Cough: Secondary | ICD-10-CM | POA: Diagnosis not present

## 2018-04-08 NOTE — Patient Instructions (Addendum)
Will refer you for a swallowing test AND refer you to a gastroenterologist (these are 2 different appointments). We will need to repeat your sleep study in order to get you on CPAP, per insurance requirements. Continue your current inhalers. Continue Flonase. Call the [place] quit line to help with smoking cessation. Keep cats out of the bedroom.  We will send you for a lung function test and chest x-ray.

## 2018-04-08 NOTE — Progress Notes (Addendum)
[name] Consultation      Assessment and Plan:  Dyspnea. -Possible etiologies include COPD and/or asthma. - Continue Spiriva, use Ventolin as needed for dyspnea. - I suspect that the patient has underlying etiologies which may be contributing such as GERD.  GERD/dysphagia. - Patient notes choking episodes and food gets stuck in her throat while eating.  This may be contributing to asthma. - We will refer for swallow evaluation and to gastroenterology. -Continue omeprazole.  Addendum: Patient is followed by [facility], and has undergone workup as below including UGI series, MBSS, EGD.   Obstructive sleep apnea. - Previous diagnosis of obstructive sleep apnea, she has lost her CPAP machine. -Per insurance requirements we will be required to repeat her sleep study in order to get her back on CPAP.  Nicotine abuse. - Continues to smoke just under a pack per day.  Spent 3 minutes in discussion. -She was previously tried on nicotine patches though not sure that they were helpful, she wonders if a higher dose would be more helpful.  She was given the number for the [place] quit line.  Orders Placed This Encounter  Procedures  . DG Chest 2 View  . DG SWALLOW FUNC SPEECH PATH  . Ambulatory referral to Gastroenterology  . Pulmonary Function Test [facility] Only  . Split night study   Return in about 3 months (around 07/08/2018).    Date: 04/08/2018  MRN# [id number] Darlene Barber 1966/03/05   Darlene Barber is a 53 y.o. old female seen in consultation for chief complaint of:    Chief Complaint  Patient presents with  . Consult    Referred by Dr. Parks Ranger for eval of chronic bronchitis.  . Shortness of Breath    with exertion. She was started on Spiriva in Dec and has been able to tell a difference.  . Cough    non productive    HPI:   She notes that she becomes winded very easily. She notes that she is choking on food a lot and she coughs a lot.  When she has these coughing episodes she cant modulate her breathing, and then she will have to calm down to get breathing better.  She feels that food gets stuck in her throat, and has on occasion vomited. She has heartburn and reflux, she takes omeprazole and antacid. The reflux wakes her up at night.  Her last meal is at 7 pm.  She has 2 cats, both sleep in bed.  She has sinus drainage. She takes flonase.  She has been taking spiriva for a month and feels that it is helping. She has a rescue inhaler that she only has used it once.  She is smoking just under a ppd and is trying to cut down.   Patient was previously diagnosed with obstructive sleep apnea in February 2019.  However she had a issues with her CPAP in her previous apartment as they were roaches and some of them got inside her CPAP machine.  When she moved to her new apartment she did not want to take the dirty CPAP with her, therefore she left it in her old place.  She was informed by DME that she does not yet qualify for a new machine and therefore is not currently wearing CPAP.  She continues to have daytime sleepiness, choking episodes at night.  **Sleep study 04/16/17 >> Sleep apnea.  **Sleep study 04/21/17>> CPAP titration study, recommended cpap at 14.   **Review of  outside notes: Darlene Barber is established with [facility] for the management of GERD, possible SIBO, and abdominal pain. All prior office notes, lab results, imaging studies, and procedure reports reviewed as appropriate.  Summary of clinical data: -- 11/01/08: [place] abd complete - multiple gallstones, tiny hemangioma in right lobe of liver (< 1 cm). -- 11/25/08: cholecystectomy. -- 2011; CSY - normal; repeat in 2021. -- 05/31/09: CT A/P w/ contrast (indic: abd pain, diarrhea) - unremarkable. -- 01/05/10: Upper GI series w/ KUB (indic: GERD) - GERD into distal esophagus during reflux maneuver. -- 02/19/12: EGD (indic: epigastric pain, dysphagia) -  unremarkable (pathology negative for EOE or celiac disease). -- 07/04/11: MBSS (indic: dysphagia) - trace laryngeal penetration with thin liquids, no evidence of aspiration. -- 12/03/13: EGD (indic: dysphagia, GERD, bloating) - normal esophagus (dilated), stomach, and duodenum. -- 09/23/16: CRP 6.4; ESR, tTG IgA, serum IgA, H pylori breath test negative.  GI Medications: Current: omeprazole 83m daily, ranitidine 3060mdaily, phenergan 2578m 6 hrs prn, docusate 200-300 mg nightly, MiraLAX 17 g daily. Non-Pharm: low-FODMAP diet, avoid carbonation/artificial sweeteners/straws, probiotics. Prior: doxycycline (2015 - empiric tx of possible SIBO).   PMHX:   Past Medical History:  Diagnosis Date  . Allergy   . Anxiety   . Bursitis of both hips   . Depression   . Frequent headaches   . Obesity   . Sleep apnea    doesn't use CPAP machine broken,    Surgical Hx:  Past Surgical History:  Procedure Laterality Date  . CESAREAN SECTION     x2  . CHOLECYSTECTOMY    . COLONOSCOPY WITH PROPOFOL N/A 11/20/2017   Procedure: COLONOSCOPY WITH PROPOFOL;  Surgeon: VanLin LandsmanD;  [facility];  Service: Gastroenterology;  Laterality: N/A;  . HYSTERECTOMY ABDOMINAL WITH SALPINGECTOMY  1998  . HYSTEROSCOPY     Family Hx:  Family History  Problem Relation Age of Onset  . Heart failure Maternal Grandmother   . Alzheimer's disease Maternal Grandfather   . Emphysema Paternal Grandmother   . Hodgkin's lymphoma Mother   . Diabetes Maternal Aunt   . Diabetes Maternal Uncle   . Cancer Other   . Breast cancer Neg Hx    Social Hx:   Social History   Tobacco Use  . Smoking status: Current Every Day Smoker    Packs/day: 0.50    Years: 30.00    Pack years: 15.00    Types: Cigarettes  . Smokeless tobacco: Never Used  Substance Use Topics  . Alcohol use: Yes    Comment: occ once per year  . Drug use: Yes    Types: Marijuana    Comment: Current use   Medication:    Current  Outpatient Medications:  .  albuterol (PROVENTIL HFA) 108 (90 Base) MCG/ACT inhaler, INHALE 2 PUFFS INTO THE LUNGS EVERY 4 HOURS AS NEEDED FOR WHEEZING OR SHORTNESS OF BREATH, Disp: 6.7 g, Rfl: 2 .  benztropine (COGENTIN) 0.5 MG tablet, Take 0.5 mg by mouth 2 (two) times daily., Disp: , Rfl:  .  BYDUREON 2 MG PEN, INJECT 2 MG INTO THE SKIN ONCE A WEEK., Disp: 4 each, Rfl: 5 .  clobetasol cream (TEMOVATE) 0.05 %, Frequency:BID   Dosage:0.0     Instructions:  Note:Dose: 0.05 %, Disp: , Rfl:  .  cloNIDine (CATAPRES) 0.2 MG tablet, Take 0.2 mg by mouth daily., Disp: , Rfl:  .  cyclobenzaprine (FLEXERIL) 10 MG tablet, Take 10 mg by mouth 3 (three) times  daily as needed for muscle spasms., Disp: , Rfl:  .  divalproex (DEPAKOTE) 250 MG DR tablet, Take 250 mg by mouth 3 (three) times daily., Disp: , Rfl:  .  estradiol (ESTRACE) 1 MG tablet, TAKE 1 TABLET BY MOUTH DAILY, Disp: 30 tablet, Rfl: 12 .  etodolac (LODINE) 400 MG tablet, Take 400 mg by mouth 2 (two) times daily., Disp: , Rfl:  .  fluticasone (FLONASE) 50 MCG/ACT nasal spray, Place 1 spray into both nostrils daily., Disp: 16 g, Rfl: 3 .  losartan (COZAAR) 50 MG tablet, Take 1 tablet (50 mg total) by mouth daily., Disp: 90 tablet, Rfl: 1 .  lurasidone (LATUDA) 80 MG TABS tablet, Take 80 mg by mouth., Disp: , Rfl:  .  Melatonin 5 MG CAPS, Take by mouth., Disp: , Rfl:  .  nortriptyline (PAMELOR) 25 MG capsule, Take 25 mg by mouth at bedtime., Disp: , Rfl:  .  omeprazole (PRILOSEC) 40 MG capsule, TAKE 1 CAPSULE BY MOUTH ONCE DAILY. TAKE 30 MINUTES PRIOR TO BREAKFAST, Disp: 30 capsule, Rfl: 5 .  pregabalin (LYRICA) 150 MG capsule, Take by mouth., Disp: , Rfl:  .  promethazine (PHENERGAN) 25 MG tablet, TAKE 1 TABLET BY MOUTH EVERY SIX HOURS AS NEEDED FOR NAUSEA VOMITING, Disp: 30 tablet, Rfl: 2 .  propranolol ER (INDERAL LA) 120 MG 24 hr capsule, TAKE 1 CAPSULE (120 MG TOTAL) BY MOUTH DAILY., Disp: 30 capsule, Rfl: 5 .  ranitidine (ZANTAC) 150 MG  capsule, Take 150 mg by mouth., Disp: , Rfl:  .  senna (SENOKOT) 8.6 MG TABS tablet, Take 1 tablet by mouth daily., Disp: , Rfl:  .  tiotropium (SPIRIVA HANDIHALER) 18 MCG inhalation capsule, Place 1 capsule (18 mcg total) into inhaler and inhale daily., Disp: 30 capsule, Rfl: 5 .  valACYclovir (VALTREX) 1000 MG tablet, TAKE 1 TABLET BY MOUTH DAILY, Disp: 30 tablet, Rfl: 3   Allergies:  Piper and Tape  Review of Systems: Gen:  Denies  fever, sweats, chills HEENT: Denies blurred vision, double vision. bleeds, sore throat Cvc:  No dizziness, chest pain. Resp:   Denies cough or sputum production, shortness of breath Gi: Denies swallowing difficulty, stomach pain. Gu:  Denies bladder incontinence, burning urine Ext:   No Joint pain, stiffness. Skin: No skin rash,  hives  Endoc:  No polyuria, polydipsia. Psych: No depression, insomnia. Other:  All other systems were reviewed with the patient and were negative other that what is mentioned in the HPI.   Physical Examination:   VS: BP 120/80 (BP Location: Left Arm, Cuff Size: Large)   Pulse 91   Resp 16   Ht _0  (1.499 m)   Wt 216 lb (98 kg)   SpO2 93%   BMI 43.63 kg/m   General Appearance: No distress  Neuro:without focal findings,  speech normal,  HEENT: PERRLA, EOM intact.  Mallampati 3. Pulmonary: normal breath sounds, No wheezing.  CardiovascularNormal S1,S2.  No m/r/g.   Abdomen: Benign, Soft, non-tender. Renal:  No costovertebral tenderness  GU:  No performed at this time. Endoc: No evident thyromegaly, no signs of acromegaly. Skin:   warm, no rashes, no ecchymosis  Extremities: normal, no cyanosis, clubbing.  Other findings:    LABORATORY PANEL:   CBC No results for input(s): WBC, HGB, HCT, PLT in the last 168 hours. ------------------------------------------------------------------------------------------------------------------  Chemistries  No results for input(s): NA, K, CL, CO2, GLUCOSE, BUN, CREATININE,  CALCIUM, MG, AST, ALT, ALKPHOS, BILITOT in the last 168 hours.  Invalid  input(s): GFRCGP ------------------------------------------------------------------------------------------------------------------  Cardiac Enzymes No results for input(s): TROPONINI in the last 168 hours. ------------------------------------------------------------  RADIOLOGY:  No results found.     Thank  you for the consultation and for allowing [facility] to assist in the care of your patient. Our recommendations are noted above.  Please contact us if we can be of further service.   Marda Stalker, M.D., F.C.C.P.  Board Certified in Internal Medicine, Pulmonary Medicine, [place], and Sleep Medicine.  [facility] Office Number: [phone]   04/08/2018

## 2018-04-27 ENCOUNTER — Other Ambulatory Visit: Payer: Self-pay | Admitting: Family Medicine

## 2018-04-27 DIAGNOSIS — E118 Type 2 diabetes mellitus with unspecified complications: Secondary | ICD-10-CM

## 2018-05-06 ENCOUNTER — Ambulatory Visit: Payer: Medicaid Other | Attending: Internal Medicine

## 2018-05-06 DIAGNOSIS — G4733 Obstructive sleep apnea (adult) (pediatric): Secondary | ICD-10-CM | POA: Diagnosis not present

## 2018-05-07 ENCOUNTER — Ambulatory Visit
Admission: RE | Admit: 2018-05-07 | Discharge: 2018-05-07 | Disposition: A | Discharge status: Home / Self Care | Payer: Medicaid Other | Source: Ambulatory Visit | Attending: Internal Medicine | Admitting: Internal Medicine

## 2018-05-07 DIAGNOSIS — R1319 Other dysphagia: Secondary | ICD-10-CM

## 2018-05-07 NOTE — Therapy (Signed)
[place], [place], [address] Phone: [phone]   Fax:     Modified Barium Swallow  Patient Details  Name: Darlene Barber MRN: [id number] Date of Birth: June 19, 1965 No data recorded  Encounter Date: 05/07/2018  End of Session - 05/07/18 1421    Visit Number  1    Number of Visits  1    Date for SLP Re-Evaluation  05/07/18    SLP Start Time  83    SLP Stop Time   1352    SLP Time Calculation (min)  52 min    Activity Tolerance  Patient tolerated treatment well       Past Medical History:  Diagnosis Date  . Allergy   . Anxiety   . Bursitis of both hips   . Depression   . Frequent headaches   . Obesity   . Sleep apnea    doesn't use CPAP machine broken,     Past Surgical History:  Procedure Laterality Date  . CESAREAN SECTION     x2  . CHOLECYSTECTOMY    . COLONOSCOPY WITH PROPOFOL N/A 11/20/2017   Procedure: COLONOSCOPY WITH PROPOFOL;  Surgeon: Lin Landsman, MD;  Location: [facility] ENDOSCOPY;  Service: Gastroenterology;  Laterality: N/A;  . HYSTERECTOMY ABDOMINAL WITH SALPINGECTOMY  1998  . HYSTEROSCOPY      There were no vitals filed for this visit.          Subjective: Patient behavior: (alertness, ability to follow instructions, etc.):  The patient is alert, able to express her swallowing concerns, and follow directions.  Chief complaint: Patient reports choking secondary globus.  She states that meat/Doritos/pills get stuck "in the top of the back of my throat".    Objective:  Radiological Procedure: A videoflouroscopic evaluation of oral-preparatory, reflex initiation, and pharyngeal phases of the swallow was performed; as well as a screening of the upper esophageal phase.  I. POSTURE: Upright in MBS chair and standing  II. VIEW: Lateral and A-P  III. COMPENSATORY STRATEGIES: N/A  IV. BOLUSES ADMINISTERED:   Thin Liquid: 1 small, 3 rapid  consecutive   Nectar-thick Liquid: 2 moderate   Honey-thick Liquid: DNT   Puree: 3 teaspoon presentations   Mechanical Soft: 1/4 graham cracker in applesauce   Barium tablet  V. RESULTS OF EVALUATION: A. ORAL PREPARATORY PHASE: (The lips, tongue, and velum are observed for strength and coordination)       **Overall Severity Rating: within functional limits; patient had some difficulty with posterior transfer of the barium tablet  B. SWALLOW INITIATION/REFLEX: (The reflex is normal if "triggered" by the time the bolus reached the base of the tongue)  **Overall Severity Rating: within normal limits  C. PHARYNGEAL PHASE: (Pharyngeal function is normal if the bolus shows rapid, smooth, and continuous transit through the pharynx and there is no pharyngeal residue after the swallow)  **Overall Severity Rating: within normal limits  D. LARYNGEAL PENETRATION: (Material entering into the laryngeal inlet/vestibule but not aspirated) NONE  E. ASPIRATION: NONE  F. ESOPHAGEAL PHASE: (Screening of the upper esophagus) In the cervical esophagus there is a finger-like protrusion along the posterior wall during swallow (does not impede flow of boluses) consistent with prominent cricopharyngeus.  An esophageal sweep in the upright position with liquid and solid consistencies showed mid- and distal-esophageal retention with retrograde flow below the level of the pharyngoesophageal segment. A barium table moved rapidly through the pharynx and cervical esophagus.  The patient demonstrates  aerophagia particularly with liquids.    ASSESSMENT: This 53 year old woman; with complaint of choking on chicken/beef and Doritos; is presenting with normal oropharyngeal swallowing.  Oral control of the bolus including oral hold, rotary mastication, and anterior to posterior transfer is within functional limits (the patient had difficulty moving the barium tablet posterior, presumably due to oral dryness).   Timing of the  pharyngeal swallow is within normal limits, triggering as the bolus passes the base of the tongue.  Aspects of the pharyngeal stage of swallowing including tongue base retraction, hyolaryngeal excursion, epiglottic inversion, and duration/amplitude of UES opening are within normal limits.  There is no observed pharyngeal residue, laryngeal penetration, or tracheal aspiration.  The patient is not at risk for prandial aspiration.  In the cervical esophagus there is a finger-like protrusion along the posterior wall during swallow (does not impede flow of boluses) consistent with prominent cricopharyngeus.  An esophageal sweep in the upright position with liquid and solid consistencies showed mid- and distal-esophageal retention with retrograde flow below the level of the pharyngoesophageal segment. A barium table moved rapidly through the pharynx and cervical esophagus.  The patient demonstrates aerophagia particularly with liquids.    PLAN/RECOMMENDATIONS:   A. Diet: Regular, soften and moisten as needed for comfort   B. Swallowing Precautions: avoid problematic foods, alternate liquids and solids, take medication with plenty of fluids   C. Recommended consultation to: GI   D. Therapy recommendations: speech therapy is not indicated   E. Results and recommendations were discussed with the patient immediately following the study and the final report routed to the referring MD.   Other dysphagia - Plan: DG SWALLOW [place]        Problem List Patient Active Problem List   Diagnosis Date Noted  . Chronic upper extremity pain (Secondary Area of Pain) (Bilateral) (L>R) 03/16/2018  . Primary osteoarthritis involving multiple joints 03/16/2018  . Vitamin D deficiency 03/16/2018  . Cervicogenic headache 03/16/2018  . Cervical facet hypertrophy 03/16/2018  . Cervical facet syndrome (Bilateral) (L>R) 03/16/2018  . Cervical foraminal stenosis (C4-C7)  (Bilateral) (L>R) 03/16/2018  . Cervical central spinal stenosis (C5-6) 03/16/2018  . Cervical spondylitis w/ radiculitis ([place]) 03/16/2018  . Cervical radiculitis (Bilateral) (L>R) 03/16/2018  . Osteoarthritis of hips (Bilateral) 03/16/2018  . DDD (degenerative disc disease), cervical 03/16/2018  . Marijuana use 03/16/2018  . Migraines 02/12/2018  . Seasonal allergies 02/12/2018  . Chronic neck pain (Primary Area of Pain) (Bilateral) (L>R) 02/12/2018  . Chronic hip pain [facility] Area of Pain) (Bilateral) (R>L) 02/12/2018  . Chronic lower extremity pain (Fourth Area of Pain) (Right) 02/12/2018  . Chronic pain syndrome 02/12/2018  . Opiate use 02/12/2018  . Pharmacologic therapy 02/12/2018  . Disorder of skeletal system 02/12/2018  . Problems influencing health status 02/12/2018  . Hyperlipidemia associated with type 2 diabetes mellitus ([place]) 11/20/2017  . Encounter for screening colonoscopy   . Cervicalgia 10/17/2017  . Tremor 09/16/2017  . Drug-induced tremor 12/16/2016  . Hot flashes 09/10/2016  . Post menopausal syndrome 08/21/2016  . Controlled type 2 diabetes mellitus with complication, without long-term current use of insulin ([place]) 08/21/2016  . Bipolar depression ([place]) 08/20/2016  . Insomnia 08/20/2016  . Essential hypertension 08/20/2016  . Chronic bronchitis ([place]) 08/20/2016  . Tobacco abuse 08/20/2016  . Right leg swelling 01/30/2016  . Trochanteric bursitis of hip (Right) 01/30/2016  . Numbness and tingling 11/15/2014  . Morbid obesity with BMI of 40.0-44.9, adult ([place]) 11/15/2014  .  Obstructive sleep apnea (adult) (pediatric) 11/15/2014  . Abdominal bloating 12/06/2013  . Mixed incontinence urge and stress 08/22/2012  . Atrophy of vagina 08/22/2012  . Dyspareunia 08/22/2012  . Gastroesophageal reflux disease without esophagitis 06/19/2011  . Flatulence, eructation and gas pain 06/19/2011   Leroy Sea, MS/CCC- SLP  Lou Miner 05/07/2018, 2:22  PM  [facility] [place], [place], [address] Phone: [phone]   Fax:     Name: GLADIOLA MADORE MRN: [id number] Date of Birth: Dec 07, 1965

## 2018-05-08 ENCOUNTER — Other Ambulatory Visit: Payer: Self-pay

## 2018-05-08 DIAGNOSIS — G4733 Obstructive sleep apnea (adult) (pediatric): Secondary | ICD-10-CM | POA: Diagnosis not present

## 2018-05-08 DIAGNOSIS — Z01818 Encounter for other preprocedural examination: Secondary | ICD-10-CM

## 2018-05-11 ENCOUNTER — Other Ambulatory Visit: Payer: Medicaid Other

## 2018-05-11 DIAGNOSIS — Z01818 Encounter for other preprocedural examination: Secondary | ICD-10-CM | POA: Diagnosis not present

## 2018-05-11 DIAGNOSIS — K219 Gastro-esophageal reflux disease without esophagitis: Secondary | ICD-10-CM | POA: Diagnosis not present

## 2018-05-11 DIAGNOSIS — K5909 Other constipation: Secondary | ICD-10-CM | POA: Diagnosis not present

## 2018-05-11 DIAGNOSIS — R1310 Dysphagia, unspecified: Secondary | ICD-10-CM | POA: Diagnosis not present

## 2018-05-11 LAB — URINALYSIS, COMPLETE
Bilirubin, UA: NEGATIVE
Glucose, UA: NEGATIVE
Leukocytes, UA: NEGATIVE
Nitrite, UA: NEGATIVE
PROTEIN UA: NEGATIVE
RBC, UA: NEGATIVE
Specific Gravity, UA: >1.03 — ABNORMAL HIGH (ref 1.005–1.030)
Urobilinogen, Ur: 0.2 mg/dL (ref 0.2–1.0)
pH, UA: 6 (ref 5.0–7.5)

## 2018-05-13 LAB — CULTURE, URINE COMPREHENSIVE

## 2018-05-14 ENCOUNTER — Telehealth: Payer: Self-pay

## 2018-05-14 DIAGNOSIS — G4733 Obstructive sleep apnea (adult) (pediatric): Secondary | ICD-10-CM

## 2018-05-14 DIAGNOSIS — G4734 Idiopathic sleep related nonobstructive alveolar hypoventilation: Secondary | ICD-10-CM

## 2018-05-14 NOTE — Telephone Encounter (Signed)
I spoke to patient regarding sleep study results.  Mild OSA with AHI 8 and sleep related hypoxemia.   Recommend auto-CPAP with pressure range 5-20 cm H2O. Recommend ONO once patient has been established on CPAP.   Orders entered, nothing further needed at this time.

## 2018-05-18 ENCOUNTER — Encounter: Payer: Self-pay | Admitting: Urology

## 2018-05-18 ENCOUNTER — Ambulatory Visit: Payer: Medicaid Other | Admitting: Urology

## 2018-05-18 VITALS — BP 134/83 | HR 86 | Ht 59.0 in | Wt 214.4 lb

## 2018-05-18 DIAGNOSIS — N3946 Mixed incontinence: Secondary | ICD-10-CM

## 2018-05-18 MED ORDER — ONABOTULINUMTOXINA 100 UNITS IJ SOLR
100.0000 [IU] | Freq: Once | INTRAMUSCULAR | Status: AC
  Administered 2018-05-18: 100 [IU] via INTRAMUSCULAR

## 2018-05-18 MED ORDER — LIDOCAINE HCL 2 % IJ SOLN
60.0000 mL | Freq: Once | INTRAMUSCULAR | Status: AC
  Administered 2018-05-18: 1200 mg

## 2018-05-18 NOTE — Progress Notes (Signed)
Bladder Irrigation  Due to over active bladder patient is present today for a bladder insillation. Patient was cleaned and prepped in a sterile fashion. 60 ml of Lidocaine 2% was instilled  into the bladder with a 60ml syringe through the catheter in place. no complications were noted catheter is now draining fine.   Preformed by: Elberta Leatherwood, CMA  Additional notes/ Follow up: 2 weeks PVR

## 2018-05-18 NOTE — Progress Notes (Signed)
05/18/2018 10:55 AM   Darlene Barber May 15, 1965 [id number]  Referring provider: Olin Hauser, DO [address] [place], [place] [address]  No chief complaint on file.   HPI: Patient was consulted for worsening incontinence over 1 year especially since November. She leaks with coughing sneezing standing and bending. She has urge incontinence that is quite sudden. I think she has small volume bedwetting.She wears 3 pads a day that can be quite wet.  Mild grade 2 hypermobility of the bladder neck with a negative cough test with a mild cough. Grade 1 cystocele and no rectocele  The patient has mixed incontinence and mild bedwetting. Both components seem to be quite significant.  On urodynamics the patient was catheterized for 100 mL. Bladder capacity was 622 mL. The bladder was unstable reaching a pressure of 5 cm of waterfelt asurgency but she did not leak. She did note that she leaks when she goes from a sitting to standing position and that her urges will hit her suddenly. She did not leak with a Valsalva pressure of 122 cm of water. During voluntary voiding she voided 575 mL. Maximal flow was 8 mils per second. Maximum voiding pressure is 38 semis water. She emptied efficiently. She was straining some. EMG activity increased during the voiding phase. Bladder neck descent at 1 or 2 cm.   The patient has mild stress incontinence and clinically an overactive bladder with urge incontinence and mild bedwetting. I will try to help her with medical and behavioral therapy. She may be a candidate to consider a sling in the future and I would want to re-quantitate her stress component. Ongoing OAB symptoms and bedwetting would need to be discussePelvic floor exercises and fluid modifications also started.  She did not respond to Myrbetriq Patient leaks with coughing but not that much unless she has a bad cough with bronchitis. She is trying to stop smoking.    Detrol LA and oxybutynin ER 10 mg given. Re-quantitate goals and the role of a sling versus refractory therapy.  Patient has minimal stress incontinence unless she has bronchitis. She still has urge incontinence with very little warning as her primary symptom. Because of insurance we did not talk about percutaneous tibial nerve stimulation. We talked about Botox and InterStim with usual templates. She would like to try Botox first to try to avoid surgery.  She is a smoker and I will perform cystoscopy prior to the Botox to make certain her bladder is clear and this was discussed. 3 days ciprofloxacin prescription was given. She has not had blood in the urine microscopically  Today We can see and incontinence are stable Cystoscopy: Patient underwent flexible cystoscopy.  Bladder mucosa and trigone were normal.  No cystitis or carcinoma  I injected 100 units of Botox in 10 cc of normal saline attempting my usual template.  I injected in the lower third of the bladder.  They tended to be more in the right side and behind the intraureteric ridge.  There is always some difficulty injecting and controlling the needle based upon the scope technology.  Overall the procedure went very well and I was happy with the treatment.  Patient tolerated it very well  PMH: Past Medical History:  Diagnosis Date  . Allergy   . Anxiety   . Bursitis of both hips   . Depression   . Frequent headaches   . Obesity   . Sleep apnea    doesn't use CPAP machine broken,  Surgical History: Past Surgical History:  Procedure Laterality Date  . CESAREAN SECTION     x2  . CHOLECYSTECTOMY    . COLONOSCOPY WITH PROPOFOL N/A 11/20/2017   Procedure: COLONOSCOPY WITH PROPOFOL;  Surgeon: Lin Landsman, MD;  Location: [facility] ENDOSCOPY;  Service: Gastroenterology;  Laterality: N/A;  . HYSTERECTOMY ABDOMINAL WITH SALPINGECTOMY  1998  . HYSTEROSCOPY      Home Medications:  Allergies as of 05/18/2018       Reactions   [place] Other (See Comments)   Black pepper Feels like throat is closing, itchy Feels like throat is closing, itchy Feels like throat is closing, itchy   Tape Rash   Paper tape is ok to use.      Medication List       Accurate as of May 18, 2018 10:55 AM. Always use your most recent med list.        albuterol 108 (90 Base) MCG/ACT inhaler Commonly known as:  Proventil HFA INHALE 2 PUFFS INTO THE LUNGS EVERY 4 HOURS AS NEEDED FOR WHEEZING OR SHORTNESS OF BREATH   benztropine 0.5 MG tablet Commonly known as:  COGENTIN Take 0.5 mg by mouth 2 (two) times daily.   Bydureon 2 MG Pen Generic drug:  Exenatide ER INJECT 2 MG INTO THE SKIN ONCE A WEEK.   clobetasol cream 0.05 % Commonly known as:  TEMOVATE Frequency:BID   Dosage:0.0     Instructions:  Note:Dose: 0.05 %   cloNIDine 0.2 MG tablet Commonly known as:  CATAPRES Take 0.2 mg by mouth daily.   cyclobenzaprine 10 MG tablet Commonly known as:  FLEXERIL Take 10 mg by mouth 3 (three) times daily as needed for muscle spasms.   divalproex 250 MG DR tablet Commonly known as:  DEPAKOTE Take 250 mg by mouth 3 (three) times daily.   estradiol 1 MG tablet Commonly known as:  ESTRACE TAKE 1 TABLET BY MOUTH DAILY   etodolac 400 MG tablet Commonly known as:  LODINE Take 400 mg by mouth 2 (two) times daily.   fluticasone 50 MCG/ACT nasal spray Commonly known as:  FLONASE Place 1 spray into both nostrils daily.   losartan 50 MG tablet Commonly known as:  COZAAR Take 1 tablet (50 mg total) by mouth daily.   lurasidone 80 MG Tabs tablet Commonly known as:  LATUDA Take 80 mg by mouth.   Melatonin 5 MG Caps Take by mouth.   nortriptyline 25 MG capsule Commonly known as:  PAMELOR Take 25 mg by mouth at bedtime.   omeprazole 40 MG capsule Commonly known as:  PRILOSEC TAKE 1 CAPSULE BY MOUTH ONCE DAILY. TAKE 30 MINUTES PRIOR TO BREAKFAST   pregabalin 150 MG capsule Commonly known as:  LYRICA Take by  mouth.   promethazine 25 MG tablet Commonly known as:  PHENERGAN TAKE 1 TABLET BY MOUTH EVERY SIX HOURS AS NEEDED FOR NAUSEA VOMITING   propranolol ER 120 MG 24 hr capsule Commonly known as:  INDERAL LA TAKE 1 CAPSULE (120 MG TOTAL) BY MOUTH DAILY.   ranitidine 150 MG capsule Commonly known as:  ZANTAC Take 150 mg by mouth.   senna 8.6 MG Tabs tablet Commonly known as:  SENOKOT Take 1 tablet by mouth daily.   tiotropium 18 MCG inhalation capsule Commonly known as:  Spiriva HandiHaler Place 1 capsule (18 mcg total) into inhaler and inhale daily.   valACYclovir 1000 MG tablet Commonly known as:  VALTREX TAKE 1 TABLET BY MOUTH DAILY  Allergies:  Allergies  Allergen Reactions  . [place] Other (See Comments)    Black pepper Feels like throat is closing, itchy Feels like throat is closing, itchy Feels like throat is closing, itchy  . Tape Rash    Paper tape is ok to use.    Family History: Family History  Problem Relation Age of Onset  . Heart failure Maternal Grandmother   . Alzheimer's disease Maternal Grandfather   . Emphysema Paternal Grandmother   . Hodgkin's lymphoma Mother   . Diabetes Maternal Aunt   . Diabetes Maternal Uncle   . Cancer Other   . Breast cancer Neg Hx     Social History:  reports that she has been smoking cigarettes. She has a 15.00 pack-year smoking history. She has never used smokeless tobacco. She reports current alcohol use. She reports current drug use. Drug: Marijuana.  ROS:                                        Physical Exam: There were no vitals taken for this visit.  Constitutional:  Alert and oriented, No acute distress.   Laboratory Data: Lab Results  Component Value Date   WBC 9.4 11/19/2017   HGB 14.4 11/19/2017   HCT 41.3 11/19/2017   MCV 96.0 11/19/2017   PLT 306 11/19/2017    Lab Results  Component Value Date   CREATININE 0.69 11/19/2017    No results found for: PSA  No  results found for: TESTOSTERONE  Lab Results  Component Value Date   HGBA1C 6.4 (H) 11/19/2017    Urinalysis    Component Value Date/Time   COLORURINE Yellow 04/20/2012 1725   APPEARANCEUR Clear 05/11/2018 0000   LABSPEC 1.010 04/20/2012 1725   PHURINE 6.0 04/20/2012 1725   GLUCOSEU Negative 05/11/2018 0000   GLUCOSEU Negative 04/20/2012 1725   HGBUR Negative 04/20/2012 1725   BILIRUBINUR Negative 05/11/2018 0000   BILIRUBINUR Negative 04/20/2012 1725   KETONESUR Negative 04/20/2012 1725   PROTEINUR Negative 05/11/2018 0000   PROTEINUR Negative 04/20/2012 1725   NITRITE Negative 05/11/2018 0000   NITRITE Negative 04/20/2012 1725   LEUKOCYTESUR Negative 05/11/2018 0000   LEUKOCYTESUR Negative 04/20/2012 1725    Pertinent Imaging:   Assessment & Plan: Reassess in 6 weeks.  Nurse visit to check residual in 2 weeks.  Hopefully the patient will be greatly improved.  There are no diagnoses linked to this encounter.  No follow-ups on file.  Reece Packer, MD  [facility] [address], [place], [place] [address] [phone]

## 2018-05-21 ENCOUNTER — Other Ambulatory Visit: Payer: Self-pay | Admitting: Family Medicine

## 2018-05-21 DIAGNOSIS — J410 Simple chronic bronchitis: Secondary | ICD-10-CM

## 2018-05-22 ENCOUNTER — Other Ambulatory Visit: Payer: Self-pay

## 2018-05-22 ENCOUNTER — Other Ambulatory Visit: Payer: Medicaid Other

## 2018-05-22 DIAGNOSIS — E118 Type 2 diabetes mellitus with unspecified complications: Secondary | ICD-10-CM

## 2018-05-22 DIAGNOSIS — I10 Essential (primary) hypertension: Secondary | ICD-10-CM | POA: Diagnosis not present

## 2018-05-23 LAB — BASIC METABOLIC PANEL WITH GFR
BUN: 13 mg/dL (ref 7–25)
CO2: 28 mmol/L (ref 20–32)
Calcium: 9 mg/dL (ref 8.6–10.4)
Chloride: 105 mmol/L (ref 98–110)
Creat: 0.78 mg/dL (ref 0.50–1.05)
GFR, Est African American: 101 mL/min/{1.73_m2} (ref >=60)
GFR, Est Non African American: 87 mL/min/{1.73_m2} (ref >=60)
Glucose, Bld: 146 mg/dL — ABNORMAL HIGH (ref 65–99)
Potassium: 4.6 mmol/L (ref 3.5–5.3)
Sodium: 141 mmol/L (ref 135–146)

## 2018-05-23 LAB — HEMOGLOBIN A1C
Hgb A1c MFr Bld: 7 % of total Hgb — ABNORMAL HIGH (ref <5.7)
Mean Plasma Glucose: 154 (calc)
eAG (mmol/L): 8.5 (calc)

## 2018-05-27 ENCOUNTER — Ambulatory Visit (INDEPENDENT_AMBULATORY_CARE_PROVIDER_SITE_OTHER): Payer: Medicaid Other | Admitting: Family Medicine

## 2018-05-27 ENCOUNTER — Encounter: Payer: Self-pay | Admitting: Family Medicine

## 2018-05-27 ENCOUNTER — Other Ambulatory Visit: Payer: Self-pay

## 2018-05-27 ENCOUNTER — Other Ambulatory Visit: Payer: Self-pay | Admitting: Family Medicine

## 2018-05-27 VITALS — BP 128/82 | HR 99 | Temp 98.5°F | Resp 16 | Ht 59.0 in | Wt 214.4 lb

## 2018-05-27 DIAGNOSIS — M62830 Muscle spasm of back: Secondary | ICD-10-CM | POA: Diagnosis not present

## 2018-05-27 DIAGNOSIS — E118 Type 2 diabetes mellitus with unspecified complications: Secondary | ICD-10-CM

## 2018-05-27 DIAGNOSIS — I10 Essential (primary) hypertension: Secondary | ICD-10-CM

## 2018-05-27 DIAGNOSIS — S6982XA Other specified injuries of left wrist, hand and finger(s), initial encounter: Secondary | ICD-10-CM

## 2018-05-27 DIAGNOSIS — Z6841 Body Mass Index (BMI) 40.0 and over, adult: Secondary | ICD-10-CM | POA: Diagnosis not present

## 2018-05-27 MED ORDER — CYCLOBENZAPRINE HCL 10 MG PO TABS: 10.0000 mg | ORAL_TABLET | Freq: Every evening | ORAL | 2 refills | Status: DC | PRN

## 2018-05-27 NOTE — Patient Instructions (Addendum)
Thank you for coming to the office today.  BP is doing well. Keep on current medications.  Chemistry lab looked good.  A1c sugar was mildly elevated.  Recent Labs    11/19/17 0812 05/22/18 0936  HGBA1C 6.4* 7.0*   -------------------------------------------  Check medicine at home for Etolodac (Lodine) - if you have it, only take it as needed it is an anti inflammatory.  Recommend to start taking Tylenol Extra Strength 500mg  tabs - take 1 to 2 tabs per dose (max 1000mg ) every 6-8 hours for pain (take regularly, don't skip a dose for next 7 days), max 24 hour daily dose is 6 tablets or 3000mg . In the future you can repeat the same everyday Tylenol course for 1-2 weeks at a time.   ------------------------------------  Start Cyclobenzapine (Flexeril) 10mg  tablets (muscle relaxant) - start with half (cut) to one whole pill at night for muscle relaxant - may make you sedated or sleepy (be careful driving or working on this) if tolerated you can take half to whole tab 2 to 3 times daily or every 8 hours as needed  For the Wrist - I think it is a ligament sprain - TFCC - check into Wrist Widget support - https://www.wristwidget.com   DUE for FASTING BLOOD WORK (no food or drink after midnight before the lab appointment, only water or coffee without cream/sugar on the morning of)  SCHEDULE "Lab Only" visit in the morning at the clinic for lab draw in 6 MONTHS   - Make sure Lab Only appointment is at about 1 week before your next appointment, so that results will be available  For Lab Results, once available within 2-3 days of blood draw, you can can log in to MyChart online to view your results and a brief explanation. Also, we can discuss results at next follow-up visit.   Please schedule a Follow-up Appointment to: Return in about 6 months (around 11/27/2018) for Annual Physical.  If you have any other questions or concerns, please feel free to call the office or send a message  through [facility]. You may also schedule an earlier appointment if necessary.  Additionally, you may be receiving a survey about your experience at our office within a few days to 1 week by e-mail or mail. We value your feedback.  [name], DO [facility]

## 2018-05-27 NOTE — Progress Notes (Signed)
Subjective:    Patient ID: Darlene Barber, female    DOB: 01/27/1966, 53 y.o.   MRN: [id number]  Darlene Barber is a 53 y.o. female presenting on 05/27/2018 for Hypertension   HPI   CHRONIC HTN: Improved BP. Prior visit added Losartan Current meds: Losartan 50mg  daily, Propranolol ER 120mg  daily Reports good compliance, took meds today. Tolerating well, w/o complaints. Lifestyle: - Exercise: Limited Denies CP, HA, edema, dizziness / lightheadedness  CHRONIC DM, Type 2: Reports last lab showed A1c up to 7, prior 6.4 Meds: Bydureon Pen 2mg  weekly inj Reports  good compliance. Tolerating well w/o side-effects Currently on AARB Denies hypoglycemia, polyuria, visual changes, numbness or tingling.  Left Wrist, Pain - Reports new recent problem past few weeks, left hand dominant, thinks she may have sprained it, no trauma or other injury, pain over side of wrist only, not wearing splint Denies numbness tingling weakness, redness swelling  Several updates, from specialist Followed by Neurology Dr Melrose Nakayama - neuropathy, pain - has been referred to [facility] for Ly[place] management  Urology, botox injection - limited results, return soon for follow-up  [facility], taken off Zantac - and increased Omeprazole to TID. She had swallowing study. Now awaiting EGD   Depression screen [facility] 2/9 05/27/2018 03/02/2018 11/26/2017  Decreased Interest 0 0 0  Down, Depressed, Hopeless 0 0 0  PHQ - 2 Score 0 0 0  Altered sleeping - - -  Tired, decreased energy - - -  Change in appetite - - -  Feeling bad or failure about yourself  - - -  Trouble concentrating - - -  Moving slowly or fidgety/restless - - -  Suicidal thoughts - - -  PHQ-9 Score - - -  Difficult doing work/chores - - -    Social History   Tobacco Use  . Smoking status: Current Every Day Smoker    Packs/day: 0.50    Years: 30.00    Pack years: 15.00    Types: Cigarettes  . Smokeless tobacco: Current User  Substance  Use Topics  . Alcohol use: Yes    Comment: occ once per year  . Drug use: Yes    Types: Marijuana    Comment: Current use    Review of Systems Per HPI unless specifically indicated above     Objective:    BP 128/82   Pulse 99   Temp 98.5 F (36.9 C) (Oral)   Resp 16   Ht 4\' 11"  (1.499 m)   Wt 214 lb 6.4 oz (97.3 kg)   BMI 43.30 kg/m   Wt Readings from Last 3 Encounters:  05/27/18 214 lb 6.4 oz (97.3 kg)  05/18/18 214 lb 6.4 oz (97.3 kg)  04/08/18 216 lb (98 kg)    Physical Exam Vitals signs and nursing note reviewed.  Constitutional:      General: She is not in acute distress.    Appearance: She is well-developed. She is not diaphoretic.     Comments: Well-appearing, comfortable, cooperative, obese  HENT:     Head: Normocephalic and atraumatic.  Eyes:     General:        Right eye: No discharge.        Left eye: No discharge.     Conjunctiva/sclera: Conjunctivae normal.  Neck:     Musculoskeletal: Normal range of motion and neck supple.     Thyroid: No thyromegaly.  Cardiovascular:     Rate and Rhythm: Normal rate and regular  rhythm.     Heart sounds: Normal heart sounds. No murmur.  Pulmonary:     Effort: Pulmonary effort is normal. No respiratory distress.     Breath sounds: Normal breath sounds. No wheezing or rales.  Musculoskeletal: Normal range of motion.     Comments: Left Hand/Wrist Inspection: Normal appearance, symmetrical, no bulky MCP joints, no edema or erythema. Palpation: tender over cartilage connection of wrist ulnar aspect non bony tender - rest non tender carpal bones, including MCP, base of thumb. No distinct anatomical snuff box or scaphoid tenderness.  ROM: full active wrist ROM flex / ext, ulnar / radial deviation, pain with ulnar deviation Strength: 5/5 grip, thumb opposition, wrist flex/ext Neurovascular: distally intact   Lymphadenopathy:     Cervical: No cervical adenopathy.  Skin:    General: Skin is warm and dry.      Findings: No erythema or rash.  Neurological:     Mental Status: She is alert and oriented to person, place, and time.  Psychiatric:        Behavior: Behavior normal.     Comments: Well groomed, good eye contact, normal speech and thoughts    Results for orders placed or performed in visit on 97/02/63  BASIC METABOLIC PANEL WITH GFR  Result Value Ref Range   Glucose, Bld 146 (H) 65 - 99 mg/dL   BUN 13 7 - 25 mg/dL   Creat 0.78 0.50 - 1.05 mg/dL   GFR, Est Non African American 87 > OR = 60 mL/min/1.41m2   GFR, Est African American 101 > OR = 60 mL/min/1.43m2   BUN/Creatinine Ratio NOT APPLICABLE 6 - 22 (calc)   Sodium 141 135 - 146 mmol/L   Potassium 4.6 3.5 - 5.3 mmol/L   Chloride 105 98 - 110 mmol/L   CO2 28 20 - 32 mmol/L   Calcium 9.0 8.6 - 10.4 mg/dL  Hemoglobin A1c  Result Value Ref Range   Hgb A1c MFr Bld 7.0 (H) <5.7 % of total Hgb   Mean Plasma Glucose 154 (calc)   eAG (mmol/L) 8.5 (calc)      Assessment & Plan:   Problem List Items Addressed This Visit    Controlled type 2 diabetes mellitus with complication, without long-term current use of insulin (HCC)    Elevated A1c 6.4 to 7 Without hyperglycemia Complications - other including hyperlipidemia specifically hypertriglyceridemia, GERD, depression, obesity, hypothyroidism, OSA - increases risk of future cardiovascular complications / poor glucose control due to reduced lifestyle diet/exercise with low energy mood and fatigue  Plan 1. Continue current therapy - Bydureon 2mg  weekly [place] injection 2. Encourage improved lifestyle - low carb, low sugar diet, reduce portion size, continue improving regular exercise 3 request diabetes eye exam - [place] 4. Follow-up 6 months      Essential hypertension - Primary    Improved HTN control No known complication  Plan 1. Continue current dose Losartan 50mg  daily,  Propranolol from 120mg  daily (24 hr tab) for migraines, Clonidine 0.2mg  daily per Psych 2. Monitor BP  outside office 3. Improve lifestyle, wt loss - Smoking cessation 4. Follow-up 6 mo annual labs      Morbid obesity with BMI of 40.0-44.9, adult (HCC)    Encourage weight loss, diet lifestyle exercise       Other Visit Diagnoses    TFCC (triangular fibrocartilage complex) injury, left, initial encounter       Relevant Medications   cyclobenzaprine (FLEXERIL) 10 MG tablet   Muscle spasm of back  Relevant Medications   cyclobenzaprine (FLEXERIL) 10 MG tablet      Acute vs Subacute L ULNAR wrist pain, without swelling without acute injury Consistent with TFCC sprain No acute swelling today, no acute bony tenderness less likely fracture, no pain over anatomical snuff box  Not consistent with DeQuervain's tenosynovitis or carpal tunnel - No prior history of wrist fracture or surgery - Inadequate conservative therapy   Plan: 1. Deferred [place] given no significant impact or traumatic injury and no other features concerning 2. Check home med - consider Etolodac NSAID otherwise avoid nsaid if not taking 3. Recommend Wrist Widget for support 4. RICE therapy (rest, ice, compression, elevation) for swelling, activity modification 5. Follow-up 4-6 weeks, if still worsening, consider referral to Ortho for further eval   Meds ordered this encounter  Medications  . cyclobenzaprine (FLEXERIL) 10 MG tablet    Sig: Take 1 tablet (10 mg total) by mouth at bedtime as needed for muscle spasms.    Dispense:  30 tablet    Refill:  2    Follow up plan: Return in about 6 months (around 11/27/2018) for Annual Physical.  Future labs ordered for 11/2018  [name], [facility] 05/27/2018, 2:05 PM

## 2018-05-28 ENCOUNTER — Other Ambulatory Visit: Payer: Self-pay | Admitting: Family Medicine

## 2018-05-28 DIAGNOSIS — F319 Bipolar disorder, unspecified: Secondary | ICD-10-CM

## 2018-05-28 DIAGNOSIS — E785 Hyperlipidemia, unspecified: Secondary | ICD-10-CM

## 2018-05-28 DIAGNOSIS — Z6841 Body Mass Index (BMI) 40.0 and over, adult: Secondary | ICD-10-CM

## 2018-05-28 DIAGNOSIS — E118 Type 2 diabetes mellitus with unspecified complications: Secondary | ICD-10-CM

## 2018-05-28 DIAGNOSIS — I10 Essential (primary) hypertension: Secondary | ICD-10-CM

## 2018-05-28 DIAGNOSIS — E1169 Type 2 diabetes mellitus with other specified complication: Secondary | ICD-10-CM

## 2018-05-28 DIAGNOSIS — Z0000 Encounter for general adult medical examination without abnormal findings: Secondary | ICD-10-CM

## 2018-05-28 DIAGNOSIS — E559 Vitamin D deficiency, unspecified: Secondary | ICD-10-CM

## 2018-05-28 NOTE — Assessment & Plan Note (Signed)
Elevated A1c 6.4 to 7 Without hyperglycemia Complications - other including hyperlipidemia specifically hypertriglyceridemia, GERD, depression, obesity, hypothyroidism, OSA - increases risk of future cardiovascular complications / poor glucose control due to reduced lifestyle diet/exercise with low energy mood and fatigue  Plan 1. Continue current therapy - Bydureon 2mg  weekly [place] injection 2. Encourage improved lifestyle - low carb, low sugar diet, reduce portion size, continue improving regular exercise 3 request diabetes eye exam - [place] 4. Follow-up 6 months

## 2018-05-28 NOTE — Assessment & Plan Note (Signed)
Improved HTN control No known complication  Plan 1. Continue current dose Losartan 50mg  daily,  Propranolol from 120mg  daily (24 hr tab) for migraines, Clonidine 0.2mg  daily per Psych 2. Monitor BP outside office 3. Improve lifestyle, wt loss - Smoking cessation 4. Follow-up 6 mo annual labs

## 2018-05-28 NOTE — Assessment & Plan Note (Signed)
Encourage weight loss, diet lifestyle exercise

## 2018-05-29 ENCOUNTER — Telehealth: Payer: Self-pay

## 2018-05-29 NOTE — Telephone Encounter (Signed)
Received prior approval from [employer] regarding Bydureon 2 MG. Got approved till next year reference number for caller ID is [id number] and approval number is [id number].

## 2018-06-02 ENCOUNTER — Ambulatory Visit: Payer: Medicaid Other

## 2018-06-02 ENCOUNTER — Encounter: Payer: Self-pay | Admitting: Urology

## 2018-06-04 ENCOUNTER — Encounter: Payer: Self-pay | Admitting: Family Medicine

## 2018-06-04 DIAGNOSIS — E118 Type 2 diabetes mellitus with unspecified complications: Secondary | ICD-10-CM

## 2018-06-06 NOTE — Progress Notes (Signed)
[name] Name: AGUSTA HACKENBERG  MRN: [id number]  Referring Provider: [name] *  DOB: 02-27-1966  PCP: Olin Hauser, DO  DOS: 06/08/2018  Note by: Gaspar Cola, MD  Service setting: Virtual Visit (Telephone)  Attending: Gaspar Cola, MD  Location: Telephone Encounter  Specialty: Interventional Pain Management  Patient type: Established   Pain Management Encounter Note - Virtual Visit via Telephone Telehealth (real-time audio visits between healthcare provider and patient).  [name] Phone No.:  [phone] (home); [phone] (mobile); (Preferred) [phone]  Pre-screening note:  Our staff contacted Ms. Nylander and offered her an "in person", "face-to-face" appointment versus a telephone encounter. She indicated preferring the telephone encounter, at this time.   Primary Reason(s) for Virtual Visit: Encounter for evaluation before starting new chronic pain management plan of care (Level of risk: moderate) COVID-19*  Social distancing based on CDC ans AMA recommendations.   I called Clairissa D Prins on 06/08/2018 at 9:58 AM by telephone, but there was no answer. I then called her mobile and again there was no answer. I did leave a message to call back if interested in completing the virtual appointment. I clearly identified myself as Gaspar Cola, MD.   Location: [facility] Note by: Gaspar Cola, MD Date: 06/08/2018; Time: 9:58 AM

## 2018-06-08 ENCOUNTER — Other Ambulatory Visit: Payer: Self-pay

## 2018-06-08 ENCOUNTER — Ambulatory Visit: Payer: Medicaid Other | Attending: Pain Medicine | Admitting: Pain Medicine

## 2018-06-08 DIAGNOSIS — M25551 Pain in right hip: Secondary | ICD-10-CM

## 2018-06-08 DIAGNOSIS — M542 Cervicalgia: Secondary | ICD-10-CM

## 2018-06-08 DIAGNOSIS — G8929 Other chronic pain: Secondary | ICD-10-CM

## 2018-06-08 DIAGNOSIS — M79602 Pain in left arm: Secondary | ICD-10-CM

## 2018-06-08 DIAGNOSIS — G894 Chronic pain syndrome: Secondary | ICD-10-CM

## 2018-06-08 DIAGNOSIS — M25552 Pain in left hip: Secondary | ICD-10-CM

## 2018-06-08 DIAGNOSIS — M79604 Pain in right leg: Secondary | ICD-10-CM

## 2018-06-08 DIAGNOSIS — M899 Disorder of bone, unspecified: Secondary | ICD-10-CM

## 2018-06-08 DIAGNOSIS — M79601 Pain in right arm: Secondary | ICD-10-CM

## 2018-06-17 ENCOUNTER — Telehealth: Payer: Self-pay

## 2018-06-17 NOTE — Telephone Encounter (Signed)
Patient called questioning her apt for 07/08/18. She was supposed to have EGD and start CPAP before this apt, which neither have taken place. R/s patient to 08/20/18 so that she could accomplish these before apt. Patient is doing well at this time.

## 2018-06-22 ENCOUNTER — Other Ambulatory Visit: Payer: Self-pay | Admitting: Family Medicine

## 2018-06-28 NOTE — Progress Notes (Deleted)
06/29/2018 8:54 PM   Darlene Barber 30-Apr-1965 [id number]  Referring provider: Olin Hauser, DO [address] [place], [place] [address]  No chief complaint on file.   HPI: Darlene Barber is a 53 year old Caucasian female with urge incontinence who underwent Botox injection on 05/18/2018 with Dr. Matilde Sprang.  Background history Patient was consulted for worsening incontinence over 1 year especially since November. She leaks with coughing sneezing standing and bending. She has urge incontinence that is quite sudden. I think she has small volume bedwetting.She wears 3 pads a day that can be quite wet.  Mild grade 2 hypermobility of the bladder neck with a negative cough test with a mild cough. Grade 1 cystocele and no rectocele  The patient has mixed incontinence and mild bedwetting. Both components seem to be quite significant.  On urodynamics the patient was catheterized for 100 mL. Bladder capacity was 622 mL. The bladder was unstable reaching a pressure of 5 cm of waterfelt asurgency but she did not leak. She did note that she leaks when she goes from a sitting to standing position and that her urges will hit her suddenly. She did not leak with a Valsalva pressure of 122 cm of water. During voluntary voiding she voided 575 mL. Maximal flow was 8 mils per second. Maximum voiding pressure is 38 semis water. She emptied efficiently. She was straining some. EMG activity increased during the voiding phase. Bladder neck descent at 1 or 2 cm.   The patient has mild stress incontinence and clinically an overactive bladder with urge incontinence and mild bedwetting. I will try to help her with medical and behavioral therapy. She may be a candidate to consider a sling in the future and I would want to re-quantitate her stress component. Ongoing OAB symptoms and bedwetting would need to be discussePelvic floor exercises and fluid modifications also started.   She did not respond to Myrbetriq Patient leaks with coughing but not that much unless she has a bad cough with bronchitis. She is trying to stop smoking.   Detrol LA and oxybutynin ER 10 mg given. Re-quantitate goals and the role of a sling versus refractory therapy.  Patient has minimal stress incontinence unless she has bronchitis. She still has urge incontinence with very little warning as her primary symptom. Because of insurance we did not talk about percutaneous tibial nerve stimulation. We talked about Botox and InterStim with usual templates. She would like to try Botox first to try to avoid surgery.  She is a smoker and I will perform cystoscopy prior to the Botox to make certain her bladder is clear and this was discussed. 3 days ciprofloxacin prescription was given. She has not had blood in the urine microscopically  We can see and incontinence are stable Cystoscopy: Patient underwent flexible cystoscopy.  Bladder mucosa and trigone were normal.  No cystitis or carcinoma  I injected 100 units of Botox in 10 cc of normal saline attempting my usual template.  I injected in the lower third of the bladder.  They tended to be more in the right side and behind the intraureteric ridge.  There is always some difficulty injecting and controlling the needle based upon the scope technology.  Overall the procedure went very well and I was happy with the treatment.  Patient tolerated it very well.  Today, ***.  The patient is  experiencing urgency x *** (***), frequency x *** (***), not/is restricting fluids to avoid visits to the restroom ***,  not/is engaging in toilet mapping, incontinence x *** (***) and nocturia x *** (***).   Her BP is ***.   Her PVR is ***.    PMH: Past Medical History:  Diagnosis Date  . Allergy   . Anxiety   . Bursitis of both hips   . Depression   . Frequent headaches   . Obesity   . Sleep apnea    doesn't use CPAP machine broken,     Surgical  History: Past Surgical History:  Procedure Laterality Date  . CESAREAN SECTION     x2  . CHOLECYSTECTOMY    . COLONOSCOPY WITH PROPOFOL N/A 11/20/2017   Procedure: COLONOSCOPY WITH PROPOFOL;  Surgeon: Lin Landsman, MD;  Location: [facility];  Service: Gastroenterology;  Laterality: N/A;  . HYSTERECTOMY ABDOMINAL WITH SALPINGECTOMY  1998  . HYSTEROSCOPY      Home Medications:  Allergies as of 06/29/2018      Reactions   [place] Other (See Comments)   Black pepper Feels like throat is closing, itchy Feels like throat is closing, itchy Feels like throat is closing, itchy   Tape Rash   Paper tape is ok to use.      Medication List       Accurate as of June 28, 2018  8:54 PM. Always use your most recent med list.        albuterol 108 (90 Base) MCG/ACT inhaler Commonly known as:  Proventil HFA INHALE 2 PUFFS INTO LUNGS EVERY FOUR HOURS AS NEEDED WHEEZING SHORTNESS OF BREATH   benztropine 0.5 MG tablet Commonly known as:  COGENTIN Take 0.5 mg by mouth 2 (two) times daily.   Bydureon 2 MG Pen Generic drug:  Exenatide ER INJECT 2 MG INTO THE SKIN ONCE A WEEK.   clobetasol cream 0.05 % Commonly known as:  TEMOVATE Frequency:BID   Dosage:0.0     Instructions:  Note:Dose: 0.05 %   cloNIDine 0.2 MG tablet Commonly known as:  CATAPRES Take 0.2 mg by mouth daily.   cyclobenzaprine 10 MG tablet Commonly known as:  FLEXERIL Take 1 tablet (10 mg total) by mouth at bedtime as needed for muscle spasms.   divalproex 250 MG DR tablet Commonly known as:  DEPAKOTE Take 250 mg by mouth 3 (three) times daily.   estradiol 1 MG tablet Commonly known as:  ESTRACE TAKE 1 TABLET BY MOUTH DAILY   etodolac 400 MG tablet Commonly known as:  LODINE Take 400 mg by mouth 2 (two) times daily.   fluticasone 50 MCG/ACT nasal spray Commonly known as:  FLONASE Place 1 spray into both nostrils daily.   losartan 50 MG tablet Commonly known as:  COZAAR Take 1 tablet (50 mg total)  by mouth daily.   lurasidone 80 MG Tabs tablet Commonly known as:  LATUDA Take 80 mg by mouth.   Melatonin 5 MG Caps Take by mouth.   nortriptyline 25 MG capsule Commonly known as:  PAMELOR Take 25 mg by mouth at bedtime.   omeprazole 40 MG capsule Commonly known as:  PRILOSEC TAKE 1 CAPSULE BY MOUTH ONCE DAILY. TAKE 30 MINUTES PRIOR TO BREAKFAST   pregabalin 150 MG capsule Commonly known as:  LYRICA Take by mouth.   promethazine 25 MG tablet Commonly known as:  PHENERGAN TAKE 1 TABLET BY MOUTH EVERY SIX HOURS AS NEEDED FOR NAUSEA VOMITING   propranolol ER 120 MG 24 hr capsule Commonly known as:  INDERAL LA TAKE 1 CAPSULE (120 MG TOTAL) BY MOUTH DAILY.  senna 8.6 MG Tabs tablet Commonly known as:  SENOKOT Take 1 tablet by mouth daily.   tiotropium 18 MCG inhalation capsule Commonly known as:  Spiriva HandiHaler Place 1 capsule (18 mcg total) into inhaler and inhale daily.   valACYclovir 1000 MG tablet Commonly known as:  VALTREX TAKE 1 TABLET BY MOUTH DAILY       Allergies:  Allergies  Allergen Reactions  . [place] Other (See Comments)    Black pepper Feels like throat is closing, itchy Feels like throat is closing, itchy Feels like throat is closing, itchy  . Tape Rash    Paper tape is ok to use.    Family History: Family History  Problem Relation Age of Onset  . Heart failure Maternal Grandmother   . Alzheimer's disease Maternal Grandfather   . Emphysema Paternal Grandmother   . Hodgkin's lymphoma Mother   . Diabetes Maternal Aunt   . Diabetes Maternal Uncle   . Cancer Other   . Breast cancer Neg Hx     Social History:  reports that she has been smoking cigarettes. She has a 15.00 pack-year smoking history. She uses smokeless tobacco. She reports current alcohol use. She reports current drug use. Drug: Marijuana.  ROS:                                        Physical Exam: There were no vitals taken for this visit.   Constitutional:  Well nourished. Alert and oriented, No acute distress. HEENT: [place] AT, moist mucus membranes.  Trachea midline, no masses. Cardiovascular: No clubbing, cyanosis, or edema. Respiratory: Normal respiratory effort, no increased work of breathing. GI: Abdomen is soft, non tender, non distended, no abdominal masses. Liver and spleen not palpable.  No hernias appreciated.  Stool sample for occult testing is not indicated.   GU: No CVA tenderness.  No bladder fullness or masses.  *** external genitalia, *** pubic hair distribution, no lesions.  Normal urethral meatus, no lesions, no prolapse, no discharge.   No urethral masses, tenderness and/or tenderness. No bladder fullness, tenderness or masses. *** vagina mucosa, *** estrogen effect, no discharge, no lesions, *** pelvic support, *** cystocele and *** rectocele noted.  No cervical motion tenderness.  Uterus is freely mobile and non-fixed.  No adnexal/parametria masses or tenderness noted.  Anus and perineum are without rashes or lesions.   ***  Skin: No rashes, bruises or suspicious lesions. Lymph: No cervical or inguinal adenopathy. Neurologic: Grossly intact, no focal deficits, moving all 4 extremities. Psychiatric: Normal mood and affect.    Laboratory Data: Lab Results  Component Value Date   WBC 9.4 11/19/2017   HGB 14.4 11/19/2017   HCT 41.3 11/19/2017   MCV 96.0 11/19/2017   PLT 306 11/19/2017    Lab Results  Component Value Date   CREATININE 0.78 05/22/2018    No results found for: PSA  No results found for: TESTOSTERONE  Lab Results  Component Value Date   HGBA1C 7.0 (H) 05/22/2018    Urinalysis    Component Value Date/Time   COLORURINE Yellow 04/20/2012 1725   APPEARANCEUR Clear 05/11/2018 0000   LABSPEC 1.010 04/20/2012 1725   PHURINE 6.0 04/20/2012 1725   GLUCOSEU Negative 05/11/2018 0000   GLUCOSEU Negative 04/20/2012 1725   HGBUR Negative 04/20/2012 1725   BILIRUBINUR Negative 05/11/2018 0000    BILIRUBINUR Negative 04/20/2012 1725   KETONESUR Negative 04/20/2012  [place] Negative 05/11/2018 0000   PROTEINUR Negative 04/20/2012 1725   NITRITE Negative 05/11/2018 0000   NITRITE Negative 04/20/2012 1725   LEUKOCYTESUR Negative 05/11/2018 0000   LEUKOCYTESUR Negative 04/20/2012 1725    Pertinent Imaging: ***  Assessment & Plan:  1. Mixed incontinence     No follow-ups on file.  Zara Council, PA-C  [facility] [address] [place], [place] [address] [phone]

## 2018-06-29 ENCOUNTER — Ambulatory Visit: Payer: Medicaid Other | Admitting: Urology

## 2018-06-30 ENCOUNTER — Ambulatory Visit: Payer: Medicaid Other

## 2018-07-01 ENCOUNTER — Encounter: Admission: RE | Payer: Self-pay | Source: Home / Self Care

## 2018-07-01 ENCOUNTER — Ambulatory Visit
Admission: RE | Admit: 2018-07-01 | Payer: Medicaid Other | Source: Home / Self Care | Admitting: Unknown Physician Specialty

## 2018-07-01 SURGERY — ESOPHAGOGASTRODUODENOSCOPY (EGD) WITH PROPOFOL
Anesthesia: General

## 2018-07-08 ENCOUNTER — Ambulatory Visit: Payer: Medicaid Other | Admitting: Internal Medicine

## 2018-08-05 ENCOUNTER — Other Ambulatory Visit: Payer: Self-pay | Admitting: Internal Medicine

## 2018-08-12 ENCOUNTER — Ambulatory Visit: Payer: Medicaid Other

## 2018-08-14 ENCOUNTER — Other Ambulatory Visit: Payer: Self-pay | Admitting: Family Medicine

## 2018-08-14 DIAGNOSIS — J410 Simple chronic bronchitis: Secondary | ICD-10-CM

## 2018-08-14 DIAGNOSIS — I10 Essential (primary) hypertension: Secondary | ICD-10-CM

## 2018-08-20 ENCOUNTER — Ambulatory Visit: Payer: Medicaid Other | Admitting: Internal Medicine

## 2018-08-20 ENCOUNTER — Telehealth: Payer: Self-pay | Admitting: Internal Medicine

## 2018-08-20 NOTE — Telephone Encounter (Signed)
Called pt to cancel 08/21/2018 appt. Due to her not being rescheduled for PFT yet. This was a f.u with PFT prior so unless she feels she wants to be evaluated for other reasons, this appt. Should be cancelled until PFT is rescheduled.

## 2018-08-21 ENCOUNTER — Ambulatory Visit: Payer: Medicaid Other | Admitting: Internal Medicine

## 2018-08-21 ENCOUNTER — Other Ambulatory Visit: Payer: Self-pay

## 2018-08-21 ENCOUNTER — Telehealth: Payer: Self-pay

## 2018-08-21 ENCOUNTER — Encounter: Payer: Self-pay | Admitting: Family Medicine

## 2018-08-21 ENCOUNTER — Ambulatory Visit (INDEPENDENT_AMBULATORY_CARE_PROVIDER_SITE_OTHER): Payer: Medicaid Other | Admitting: Family Medicine

## 2018-08-21 DIAGNOSIS — F17210 Nicotine dependence, cigarettes, uncomplicated: Secondary | ICD-10-CM | POA: Diagnosis not present

## 2018-08-21 DIAGNOSIS — Z720 Tobacco use: Secondary | ICD-10-CM

## 2018-08-21 MED ORDER — VARENICLINE TARTRATE 0.5 MG X 11 & 1 MG X 42 PO MISC: ORAL | 0 refills | Status: DC

## 2018-08-21 NOTE — Patient Instructions (Signed)
  Varenicline - Chantix Dosing: Duration Dosage  Initial 3 days  0.5mg  daily  Days 4-7 0.5mg  twice daily  Day 8 through the end of therapy  1mg  twice daily   Prescribing Instructions: [name] Use of "Chantix - Starting Month Pak" and "Chantix - Continuing Month Pak" provide easy to use blister packaging.  . Black Box Warning: Mood Change -"Serious neuropsychiatric events have been reported." Stop Drug and contact health care provider if patient. "agitation, hostility, depressed mood or changes in thinking or behavior that are not typical for the patient are observed, or if the patient develops suicidal ideation / behavior." Document potential mood change.  . Patients can continue to smoke while initiating varenicline. . A "target quit date" should be set on calendar approximately Day 8 to max Day 30 (of the treatment) - PREFERRED QUIT DATE - 1 to 2 weeks from start of medicine. . Advise to take this medication WITH food.  Minimizing the nausea (typically mild and transient).  If nausea occurs, consider dose reduction or temporary cessation of treatment.   . The treatment should be continued for 12 weeks.  An additional 12 weeks of therapy can be used at the prescribers discretion.   . Chantix can change the way people react to alcohol (decreased tolerance, increased drunkenness, unusual or aggressive behavior, memory loss) . Seizures occurred in patients that had no history of seizures and in patients that had a seizure disorder that had been well controlled  . Discuss Cardiovascular Safety    Please schedule a Follow-up Appointment to: No follow-ups on file.  If you have any other questions or concerns, please feel free to call the office or send a message through [place]. You may also schedule an earlier appointment if necessary.  Additionally, you may be receiving a survey about your experience at our office within a few days to 1 week by e-mail or mail. We value your feedback.  [name], DO [place]

## 2018-08-21 NOTE — Telephone Encounter (Signed)
Called and spoke to patient just to make sure she was aware that we will reschedule her PFT then call her with her Covid test date. Patient is aware. Nothing further needed at this time.

## 2018-08-21 NOTE — Progress Notes (Signed)
Virtual Visit via Telephone The purpose of this virtual visit is to provide medical care while limiting exposure to the novel coronavirus (COVID19) for both patient and office staff.  Consent was obtained for phone visit:  Yes.   Answered questions that patient had about telehealth interaction:  Yes.   I discussed the limitations, risks, security and privacy concerns of performing an evaluation and management service by telephone. I also discussed with the patient that there may be a patient responsible charge related to this service. The patient expressed understanding and agreed to proceed.  Patient Location: Home Provider Location: [facility])  ---------------------------------------------------------------------- Chief Complaint  Patient presents with  . Nicotine Dependence    S: Reviewed CMA documentation. I have called patient and gathered additional HPI as follows:  TOBACCO ABUSE / NICOTINE DEPENDENCE - Former smoker, previous smoker for >30 years, quit back in 01/2016 - then restarted in 2018. Avg 0.5ppd. She has difficulty quitting since close contacts and family members also smoking in house. She has tried NRT patches in past without good results.  History of Bipolar Depression - followed by [facility], on med management currently, never been on Chantix or Wellbutrin.   She has tried NRT patches and Quitline. Now ready for Chantix. She is ready to quit. She was smoking more during early part of coronavirus pandemic, now has cut back  Denies any fevers, chills, sweats, body ache, cough, shortness of breath, sinus pain or pressure, headache, abdominal pain, diarrhea  Past Medical History:  Diagnosis Date  . Allergy   . Anxiety   . Bursitis of both hips   . Depression   . Frequent headaches   . Obesity   . Sleep apnea    doesn't use CPAP machine broken,    Social History   Tobacco Use  . Smoking status: Current Every Day Smoker     Packs/day: 1.00    Years: 30.00    Pack years: 30.00    Types: Cigarettes  . Smokeless tobacco: Never Used  Substance Use Topics  . Alcohol use: Yes    Comment: occ once per year  . Drug use: Yes    Types: Marijuana    Comment: Current use    Current Outpatient Medications:  .  albuterol (PROVENTIL HFA) 108 (90 Base) MCG/ACT inhaler, INHALE 2 PUFFS INTO LUNGS EVERY FOUR HOURS AS NEEDED WHEEZING SHORTNESS OF BREATH, Disp: 6.7 g, Rfl: 2 .  benztropine (COGENTIN) 0.5 MG tablet, Take 0.5 mg by mouth 2 (two) times daily., Disp: , Rfl:  .  BYDUREON 2 MG PEN, INJECT 2 MG INTO THE SKIN ONCE A WEEK., Disp: 4 each, Rfl: 5 .  clobetasol cream (TEMOVATE) 0.05 %, Frequency:BID   Dosage:0.0     Instructions:  Note:Dose: 0.05 %, Disp: , Rfl:  .  cloNIDine (CATAPRES) 0.2 MG tablet, Take 0.2 mg by mouth daily., Disp: , Rfl:  .  estradiol (ESTRACE) 1 MG tablet, TAKE 1 TABLET BY MOUTH DAILY, Disp: 30 tablet, Rfl: 12 .  etodolac (LODINE) 400 MG tablet, Take 400 mg by mouth 2 (two) times daily., Disp: , Rfl:  .  fluticasone (FLONASE) 50 MCG/ACT nasal spray, Place 1 spray into both nostrils daily., Disp: 16 g, Rfl: 3 .  losartan (COZAAR) 50 MG tablet, TAKE 1 TABLET (50 MG TOTAL) BY MOUTH DAILY., Disp: 30 tablet, Rfl: 5 .  lurasidone (LATUDA) 80 MG TABS tablet, Take 80 mg by mouth., Disp: , Rfl:  .  Melatonin 5 MG  CAPS, Take by mouth., Disp: , Rfl:  .  nortriptyline (PAMELOR) 25 MG capsule, Take 25 mg by mouth at bedtime., Disp: , Rfl:  .  omeprazole (PRILOSEC) 40 MG capsule, TAKE 1 CAPSULE BY MOUTH ONCE DAILY. TAKE 30 MINUTES PRIOR TO BREAKFAST (Patient taking differently: Take 40 mg by mouth 2 (two) times a day. ), Disp: 30 capsule, Rfl: 5 .  promethazine (PHENERGAN) 25 MG tablet, TAKE 1 TABLET BY MOUTH EVERY SIX HOURS AS NEEDED FOR NAUSEA VOMITING, Disp: 30 tablet, Rfl: 2 .  propranolol ER (INDERAL LA) 120 MG 24 hr capsule, TAKE 1 CAPSULE (120 MG TOTAL) BY MOUTH DAILY., Disp: 30 capsule, Rfl: 5 .  senna  (SENOKOT) 8.6 MG TABS tablet, Take 1 tablet by mouth daily., Disp: , Rfl:  .  SPIRIVA HANDIHALER 18 MCG inhalation capsule, PLACE 1 CAPSULE (18 MCG TOTAL) INTO INHALER AND INHALE DAILY., Disp: 30 capsule, Rfl: 5 .  valACYclovir (VALTREX) 1000 MG tablet, TAKE 1 TABLET BY MOUTH DAILY, Disp: 30 tablet, Rfl: 3 .  cyclobenzaprine (FLEXERIL) 10 MG tablet, Take 1 tablet (10 mg total) by mouth at bedtime as needed for muscle spasms. (Patient not taking: Reported on 08/21/2018), Disp: 30 tablet, Rfl: 2 .  divalproex (DEPAKOTE) 250 MG DR tablet, Take 250 mg by mouth 3 (three) times daily., Disp: , Rfl:  .  pregabalin (LYRICA) 150 MG capsule, Take by mouth., Disp: , Rfl:  .  varenicline (CHANTIX PAK) 0.5 MG X 11 & 1 MG X 42 tablet, Take one 0.5 mg tab by mouth once daily for 3 days, increase to one 0.5 mg twice daily for 4 days, then increase to one 1 mg twice daily., Disp: 53 tablet, Rfl: 0  Depression screen [facility] 2/9 08/21/2018 05/27/2018 03/02/2018  Decreased Interest 0 0 0  Down, Depressed, Hopeless 0 0 0  PHQ - 2 Score 0 0 0  Altered sleeping 2 - -  Tired, decreased energy 2 - -  Change in appetite 3 - -  Feeling bad or failure about yourself  0 - -  Trouble concentrating 0 - -  Moving slowly or fidgety/restless 0 - -  Suicidal thoughts 0 - -  PHQ-9 Score 7 - -  Difficult doing work/chores Not difficult at all - -    GAD 7 : Generalized Anxiety Score 05/26/2017 08/20/2016  Nervous, Anxious, on Edge 2 3  Control/stop worrying 3 3  Worry too much - different things 2 3  Trouble relaxing 2 3  Restless 2 3  Easily annoyed or irritable 1 3  Afraid - awful might happen 1 3  Total GAD 7 Score 13 21  Anxiety Difficulty Not difficult at all Very difficult    -------------------------------------------------------------------------- O: No physical exam performed due to remote telephone encounter.  Lab results reviewed.  No results found for this or any previous visit (from the past 2160  hour(s)).  -------------------------------------------------------------------------- A&P:  Problem List Items Addressed This Visit    Tobacco abuse - Primary   Relevant Medications   varenicline (CHANTIX PAK) 0.5 MG X 11 & 1 MG X 42 tablet    Other Visit Diagnoses    Cigarette nicotine dependence without complication       Relevant Medications   varenicline (CHANTIX PAK) 0.5 MG X 11 & 1 MG X 42 tablet     Active smoker, ready to quit - Caution with mental health and on psych medications, advised if any concerns with mood while on chantix, to stop and contact  us and seek help if need.  Plan: 1. Start Varenicline (Chantix) Day 1-3: 0.5mg  once daily WITH FOOD, Days 4-7: increase to 0.5mg  BID, then maintenance dose after 1 week: 1mg  BID for up to 12 weeks - Set quit date 1 week after start of medication, alternatively if unable to quit, then set goal reduce by 50% or more by 4 weeks, then another 50% reduction in 4 more weeks, and lastly quit after final 4 weeks (total 12 week therapy) - Notify us if ready for - recommended additional 12 weeks if needed for maintenance - Counseling on side effects primarily nausea (take with food), headaches, insomnia, vivid dreams, mood instability, seizure, rare risk of suicidal ideation, if any serious agitation or acute depression severe mood changes need to stop med  Discussion today >5 minutes (<10 minutes) specifically on counseling on risks of tobacco use, complications, treatment, smoking cessation.  Meds ordered this encounter  Medications  . varenicline (CHANTIX PAK) 0.5 MG X 11 & 1 MG X 42 tablet    Sig: Take one 0.5 mg tab by mouth once daily for 3 days, increase to one 0.5 mg twice daily for 4 days, then increase to one 1 mg twice daily.    Dispense:  53 tablet    Refill:  0    Follow-up: - Return in 4 weeks - 3 months if need for smoking  Patient verbalizes understanding with the above medical recommendations including the limitation  of remote medical advice.  Specific follow-up and call-back criteria were given for patient to follow-up or seek medical care more urgently if needed.   - Time spent in direct consultation with patient on phone: 11 minutes  [name], [facility] 08/21/2018, 4:00 PM

## 2018-09-06 NOTE — Progress Notes (Signed)
09/07/2018 1:03 PM   Darlene Barber 01/11/1966 [id number]  Referring provider: Olin Hauser, DO [address] [place],  [place] [address]  Chief Complaint  Patient presents with  . Urinary Incontinence    HPI: Darlene Barber is a 53 year old female with OAB and mixed incontinence and mild bed wetting who underwent Botox injection on 05/18/2018 with Dr. Matilde Sprang who presents today for follow up.  She has been on Myrbetriq, Detrol LA and oxybutynin ER without obtaining goals.    She underwent Botox injections in the office on 05/18/2018 and has been very pleased with the results.  She has noticed a significant reduction in her urge incontinence and her stress incontinence.  Her urine would just "gush out" when she coughed or sneezed prior to having the Botox.  She states now it is a little squirt.    Patient denies any gross hematuria, dysuria or suprapubic/flank pain.  Patient denies any fevers, chills, nausea or vomiting.   She is voiding well.  Her PVR today is 10 mL.    PMH: Past Medical History:  Diagnosis Date  . Allergy   . Anxiety   . Bursitis of both hips   . Depression   . Frequent headaches   . Obesity   . Sleep apnea    doesn't use CPAP machine broken,     Surgical History: Past Surgical History:  Procedure Laterality Date  . CESAREAN SECTION     x2  . CHOLECYSTECTOMY    . COLONOSCOPY WITH PROPOFOL N/A 11/20/2017   Procedure: COLONOSCOPY WITH PROPOFOL;  Surgeon: Lin Landsman, MD;  Location: [facility] ENDOSCOPY;  Service: Gastroenterology;  Laterality: N/A;  . HYSTERECTOMY ABDOMINAL WITH SALPINGECTOMY  1998  . HYSTEROSCOPY      Home Medications:  Allergies as of 09/07/2018      Reactions   Piper Other (See Comments)   Black pepper Feels like throat is closing, itchy Feels like throat is closing, itchy Feels like throat is closing, itchy   Tape Rash   Paper tape is ok to use.      Medication List       Accurate as of September 07, 2018  11:59 PM. If you have any questions, ask your nurse or doctor.        albuterol 108 (90 Base) MCG/ACT inhaler Commonly known as: Proventil HFA INHALE 2 PUFFS INTO LUNGS EVERY FOUR HOURS AS NEEDED WHEEZING SHORTNESS OF BREATH   benztropine 0.5 MG tablet Commonly known as: COGENTIN Take 0.5 mg by mouth 2 (two) times daily.   Bydureon 2 MG Pen Generic drug: Exenatide ER INJECT 2 MG INTO THE SKIN ONCE A WEEK.   clobetasol cream 0.05 % Commonly known as: TEMOVATE Frequency:BID   Dosage:0.0     Instructions:  Note:Dose: 0.05 %   cloNIDine 0.2 MG tablet Commonly known as: CATAPRES Take 0.2 mg by mouth daily.   cyclobenzaprine 10 MG tablet Commonly known as: FLEXERIL Take 1 tablet (10 mg total) by mouth at bedtime as needed for muscle spasms.   divalproex 250 MG DR tablet Commonly known as: DEPAKOTE Take 250 mg by mouth 3 (three) times daily.   estradiol 1 MG tablet Commonly known as: ESTRACE TAKE 1 TABLET BY MOUTH DAILY   etodolac 400 MG tablet Commonly known as: LODINE Take 400 mg by mouth 2 (two) times daily.   fluticasone 50 MCG/ACT nasal spray Commonly known as: FLONASE Place 1 spray into both nostrils daily.   losartan  50 MG tablet Commonly known as: COZAAR TAKE 1 TABLET (50 MG TOTAL) BY MOUTH DAILY.   lurasidone 80 MG Tabs tablet Commonly known as: LATUDA Take 80 mg by mouth.   Melatonin 5 MG Caps Take by mouth.   nortriptyline 25 MG capsule Commonly known as: PAMELOR Take 25 mg by mouth at bedtime.   omeprazole 40 MG capsule Commonly known as: PRILOSEC TAKE 1 CAPSULE BY MOUTH ONCE DAILY. TAKE 30 MINUTES PRIOR TO BREAKFAST What changed: See the new instructions.   pregabalin 150 MG capsule Commonly known as: LYRICA Take by mouth.   promethazine 25 MG tablet Commonly known as: PHENERGAN TAKE 1 TABLET BY MOUTH EVERY SIX HOURS AS NEEDED FOR NAUSEA VOMITING   propranolol ER 120 MG 24 hr capsule Commonly known as: INDERAL LA TAKE 1 CAPSULE (120 MG  TOTAL) BY MOUTH DAILY.   senna 8.6 MG Tabs tablet Commonly known as: SENOKOT Take 1 tablet by mouth daily.   Spiriva HandiHaler 18 MCG inhalation capsule Generic drug: tiotropium PLACE 1 CAPSULE (18 MCG TOTAL) INTO INHALER AND INHALE DAILY.   valACYclovir 1000 MG tablet Commonly known as: VALTREX TAKE 1 TABLET BY MOUTH DAILY   varenicline 0.5 MG X 11 & 1 MG X 42 tablet Commonly known as: CHANTIX PAK Take one 0.5 mg tab by mouth once daily for 3 days, increase to one 0.5 mg twice daily for 4 days, then increase to one 1 mg twice daily.       Allergies:  Allergies  Allergen Reactions  . Piper Other (See Comments)    Black pepper Feels like throat is closing, itchy Feels like throat is closing, itchy Feels like throat is closing, itchy  . Tape Rash    Paper tape is ok to use.    Family History: Family History  Problem Relation Age of Onset  . Heart failure Maternal Grandmother   . Alzheimer's disease Maternal Grandfather   . Emphysema Paternal Grandmother   . Hodgkin's lymphoma Mother   . Diabetes Maternal Aunt   . Diabetes Maternal Uncle   . Cancer Other   . Breast cancer Neg Hx     Social History:  reports that she has been smoking cigarettes. She has a 30.00 pack-year smoking history. She has never used smokeless tobacco. She reports current alcohol use. She reports current drug use. Drug: Marijuana.  ROS: UROLOGY Frequent Urination?: No Hard to postpone urination?: No Burning/pain with urination?: No Get up at night to urinate?: No Leakage of urine?: Yes Urine stream starts and stops?: No Trouble starting stream?: No Do you have to strain to urinate?: No Blood in urine?: No Urinary tract infection?: No Sexually transmitted disease?: No Injury to kidneys or bladder?: No Painful intercourse?: No Weak stream?: No Currently pregnant?: No Vaginal bleeding?: No Last menstrual period?: n  Gastrointestinal Nausea?: Yes Vomiting?: No  Indigestion/heartburn?: No Diarrhea?: No Constipation?: Yes  Constitutional Fever: No Night sweats?: No Weight loss?: No Fatigue?: No  Skin Skin rash/lesions?: No Itching?: No  Eyes Blurred vision?: No Double vision?: No  Ears/Nose/Throat Sore throat?: No Sinus problems?: Yes  Hematologic/Lymphatic Swollen glands?: No Easy bruising?: No  Cardiovascular Leg swelling?: No  Respiratory Cough?: No Shortness of breath?: Yes  Endocrine Excessive thirst?: No  Musculoskeletal Back pain?: No Joint pain?: No  Neurological Headaches?: No Dizziness?: No  Psychologic Depression?: No Anxiety?: No  Physical Exam: BP 138/62   Pulse 66   Ht 4\' 11"  (1.499 m)   Wt 214 lb (97.1 kg)  BMI 43.22 kg/m   Constitutional:  Well nourished. Alert and oriented, No acute distress. HEENT: [place] AT, moist mucus membranes.  Trachea midline, no masses. Cardiovascular: No clubbing, cyanosis, or edema. Respiratory: Normal respiratory effort, no increased work of breathing. Neurologic: Grossly intact, no focal deficits, moving all 4 extremities. Psychiatric: Normal mood and affect.   Laboratory Data: Lab Results  Component Value Date   WBC 9.4 11/19/2017   HGB 14.4 11/19/2017   HCT 41.3 11/19/2017   MCV 96.0 11/19/2017   PLT 306 11/19/2017    Lab Results  Component Value Date   CREATININE 0.78 05/22/2018    No results found for: PSA  No results found for: TESTOSTERONE  Lab Results  Component Value Date   HGBA1C 7.0 (H) 05/22/2018    Lab Results  Component Value Date   TSH 1.97 11/19/2017       Component Value Date/Time   CHOL 169 11/19/2017 0812   HDL 40 (L) 11/19/2017 0812   CHOLHDL 4.2 11/19/2017 0812   VLDL 45 (H) 11/12/2016 1109   LDLCALC 92 11/19/2017 0812    Lab Results  Component Value Date   AST 13 11/19/2017   Lab Results  Component Value Date   ALT 11 11/19/2017   No components found for: ALKALINEPHOPHATASE No components found for:  BILIRUBINTOTAL  No results found for: ESTRADIOL  Urinalysis    Component Value Date/Time   COLORURINE Yellow 04/20/2012 1725   APPEARANCEUR Clear 05/11/2018 0000   LABSPEC 1.010 04/20/2012 1725   PHURINE 6.0 04/20/2012 1725   GLUCOSEU Negative 05/11/2018 0000   GLUCOSEU Negative 04/20/2012 1725   HGBUR Negative 04/20/2012 1725   BILIRUBINUR Negative 05/11/2018 0000   BILIRUBINUR Negative 04/20/2012 1725   KETONESUR Negative 04/20/2012 1725   PROTEINUR Negative 05/11/2018 0000   PROTEINUR Negative 04/20/2012 1725   NITRITE Negative 05/11/2018 0000   NITRITE Negative 04/20/2012 1725   LEUKOCYTESUR Negative 05/11/2018 0000   LEUKOCYTESUR Negative 04/20/2012 1725    I have reviewed the labs.   Pertinent Imaging: Results for SARYAH, LOPER (MRN [id number]) as of 09/07/2018 15:34  Ref. Range 09/07/2018 11:09  Scan Result Unknown 10ML     Assessment & Plan:    1. OAB Status post Botox injection 05/2018 - very pleased with the results Schedule next Botox injection 11/2018  2. Mixed urinary incontinence At goal after Botox injection.   Return for return in September for Botox injections with Dr. Matilde Sprang .  These notes generated with voice recognition software. I apologize for typographical errors.  Zara Council, PA-C  [facility] [address]  [place], [place] [address] [phone]

## 2018-09-07 ENCOUNTER — Other Ambulatory Visit: Payer: Self-pay

## 2018-09-07 ENCOUNTER — Ambulatory Visit: Payer: Medicaid Other | Admitting: Urology

## 2018-09-07 ENCOUNTER — Encounter: Payer: Self-pay | Admitting: Urology

## 2018-09-07 VITALS — BP 138/62 | HR 66 | Ht 59.0 in | Wt 214.0 lb

## 2018-09-07 DIAGNOSIS — N3281 Overactive bladder: Secondary | ICD-10-CM

## 2018-09-07 DIAGNOSIS — N3946 Mixed incontinence: Secondary | ICD-10-CM | POA: Diagnosis not present

## 2018-09-07 LAB — BLADDER SCAN AMB NON-IMAGING

## 2018-09-09 ENCOUNTER — Other Ambulatory Visit: Payer: Self-pay

## 2018-09-10 DIAGNOSIS — R32 Unspecified urinary incontinence: Secondary | ICD-10-CM | POA: Diagnosis not present

## 2018-09-14 ENCOUNTER — Ambulatory Visit: Payer: Medicaid Other

## 2018-09-15 DIAGNOSIS — G4733 Obstructive sleep apnea (adult) (pediatric): Secondary | ICD-10-CM | POA: Diagnosis not present

## 2018-09-17 ENCOUNTER — Telehealth: Payer: Self-pay | Admitting: Urology

## 2018-09-17 NOTE — Telephone Encounter (Signed)
Darlene Barber is on the schedule for Botox injection with Dr. Matilde Sprang for 11/23/2018.  She has a lab appointment on 11/19/2018.  We will need to make sure her insurance approves her second injection and that we have the Botox in stock for her.

## 2018-09-21 ENCOUNTER — Encounter
Admission: RE | Admit: 2018-09-21 | Discharge: 2018-09-21 | Disposition: A | Discharge status: Home / Self Care | Payer: Medicaid Other | Source: Ambulatory Visit | Attending: Internal Medicine | Admitting: Internal Medicine

## 2018-09-21 ENCOUNTER — Other Ambulatory Visit: Payer: Self-pay

## 2018-09-21 ENCOUNTER — Ambulatory Visit: Payer: Medicaid Other | Admitting: Internal Medicine

## 2018-09-21 DIAGNOSIS — Z01812 Encounter for preprocedural laboratory examination: Secondary | ICD-10-CM | POA: Insufficient documentation

## 2018-09-21 DIAGNOSIS — Z1159 Encounter for screening for other viral diseases: Secondary | ICD-10-CM | POA: Insufficient documentation

## 2018-09-22 LAB — SARS CORONAVIRUS 2 (TAT 6-24 HRS): SARS Coronavirus 2: NEGATIVE

## 2018-09-23 ENCOUNTER — Other Ambulatory Visit: Payer: Self-pay | Admitting: Family Medicine

## 2018-09-23 DIAGNOSIS — Z720 Tobacco use: Secondary | ICD-10-CM

## 2018-09-23 DIAGNOSIS — J0110 Acute frontal sinusitis, unspecified: Secondary | ICD-10-CM

## 2018-09-23 DIAGNOSIS — F17210 Nicotine dependence, cigarettes, uncomplicated: Secondary | ICD-10-CM

## 2018-09-23 DIAGNOSIS — J410 Simple chronic bronchitis: Secondary | ICD-10-CM

## 2018-09-23 DIAGNOSIS — J3089 Other allergic rhinitis: Secondary | ICD-10-CM

## 2018-09-23 MED ORDER — VARENICLINE TARTRATE 1 MG PO TABS: 1.0000 mg | ORAL_TABLET | Freq: Two times a day (BID) | ORAL | 1 refills | Status: DC

## 2018-09-24 ENCOUNTER — Ambulatory Visit: Payer: Medicaid Other | Attending: Internal Medicine

## 2018-09-24 ENCOUNTER — Other Ambulatory Visit: Payer: Self-pay

## 2018-09-24 DIAGNOSIS — R0609 Other forms of dyspnea: Secondary | ICD-10-CM

## 2018-09-24 MED ORDER — ALBUTEROL SULFATE (2.5 MG/3ML) 0.083% IN NEBU
2.5000 mg | INHALATION_SOLUTION | Freq: Once | RESPIRATORY_TRACT | Status: AC
  Administered 2018-09-24: 2.5 mg via RESPIRATORY_TRACT
  Filled 2018-09-24: qty 3

## 2018-10-04 ENCOUNTER — Encounter: Payer: Self-pay | Admitting: Internal Medicine

## 2018-10-04 NOTE — Progress Notes (Signed)
[facility] Consultation      Assessment and Plan:  Dyspnea. COPD.  Restrictive lung disease. -Suspect secondary to mild copd and restriction from obesity.  - Continue Spiriva, use Ventolin as needed for dyspnea. - I suspect that the patient has underlying etiologies which may be contributing such as GERD.  GERD/dysphagia. - Patient notes choking episodes and food gets stuck in her throat while eating.  This may be contributing to asthma. - We will refer for swallow evaluation and to gastroenterology. -Continue omeprazole.  Addendum: Patient is followed by [facility], and has undergone workup as below including UGI series, MBSS, EGD.   Obstructive sleep apnea. - Is now on auto-cpap, doing well but recently had issues with her mask.  --She is going to follow up with [facility] for mask replacement.   Nicotine abuse. - Down to half ppd.  Spent 3 minutes in discussion. -She did not call the  quit line, she was given the info today to call.   No orders of the defined types were placed in this encounter.  No follow-ups on file.    Date: 10/04/2018  MRN# [id number] Darlene Barber 04-30-1965   Darlene Barber is a 53 y.o. old female seen in consultation for chief complaint of:    No chief complaint on file.   HPI:  Darlene Barber is a 53 y.o. female with OSA and dyspnea symptoms thought to be related to possible asthma. She also has severe gerd followed by GI. She has had an upper GI workup which did not show evidence of significant aspiration, or esophageal narrowing.  She was advised to use CPAP, spiriva, and smoking cessation.   She is using spiriva once daily, she is using albuterol about once per twice per day. The recent heat has been bothering her. She had been doing well with cpap but then got a hole in her mask and stopped for a week.  She continues to smoke about half ppd.   **CPAP download 07/07/2018-10/04/2018>> raw data personally  reviewed.  Uses greater than 4 hours is 60/90 days (71%).  Average usage on days used is 6 hours 48 minutes, CPAP setting is 5-20.  Median pressure 10, 9th percentile pressure is 13.7, maximum pressure is 15.1.  Leaks are within normal limits.  Residual AHI is 2.6 overall this shows borderline compliance with CPAP with excellent control obstructive sleep apnea when used. **Sleep study 04/16/17 >> Sleep apnea.  **Sleep study 04/21/17>> CPAP titration study, recommended cpap at 14.  **PFT 09/24/2018>> tracings personally reviewed.  FVC is 64% predicted, FEV1 is 48% there is 42% improvement with bronchodilator therapy up to 68% predicted.  Ratio is 59% prebronchodilator and 73% postbronchodilator.  TLC is 104%, ratio 146%, DLCO is 100% predicted flow volume loop is mildly obstructed and restricted. -Overall prebronchodilator loop shows obstruction, however results are likely indicative of poor expiratory effort.  Post bronchodilator shows significant improvement with ratio greater than 73%, mildly reduced volumes.  Suggestive of mild restriction and obstructive lung disease.  **Review of outside notes: Darlene Barber is established with [facility] for the management of GERD, possible SIBO, and abdominal pain. All prior office notes, lab results, imaging studies, and procedure reports reviewed as appropriate.  Summary of clinical data: -- 11/01/08: [place] abd complete - multiple gallstones, tiny hemangioma in right lobe of liver (< 1 cm). -- 11/25/08: cholecystectomy. -- 2011; CSY - normal; repeat in 2021. -- 05/31/09: CT A/P w/ contrast (indic:  abd pain, diarrhea) - unremarkable. -- 01/05/10: Upper GI series w/ KUB (indic: GERD) - GERD into distal esophagus during reflux maneuver. -- 02/19/12: EGD (indic: epigastric pain, dysphagia) - unremarkable (pathology negative for EOE or celiac disease). -- 07/04/11: MBSS (indic: dysphagia) - trace laryngeal penetration with thin liquids, no evidence of  aspiration. -- 12/03/13: EGD (indic: dysphagia, GERD, bloating) - normal esophagus (dilated), stomach, and duodenum. -- 09/23/16: CRP 6.4; ESR, tTG IgA, serum IgA, H pylori breath test negative.  GI Medications: Current: omeprazole 10m daily, ranitidine 3029mdaily, phenergan 2548m 6 hrs prn, docusate 200-300 mg nightly, MiraLAX 17 g daily. Non-Pharm: low-FODMAP diet, avoid carbonation/artificial sweeteners/straws, probiotics. Prior: doxycycline (2015 - empiric tx of possible SIBO).  Medication:    Current Outpatient Medications:  .  albuterol (VENTOLIN HFA) 108 (90 Base) MCG/ACT inhaler, INHALE 2 PUFFS INTO LUNGS EVERY FOUR HOURS AS NEEDED FOR WHEEZING SHORTNESS OF BREATH, Disp: 8.5 g, Rfl: 2 .  benztropine (COGENTIN) 0.5 MG tablet, Take 0.5 mg by mouth 2 (two) times daily., Disp: , Rfl:  .  BYDUREON 2 MG PEN, INJECT 2 MG INTO THE SKIN ONCE A WEEK., Disp: 4 each, Rfl: 5 .  clobetasol cream (TEMOVATE) 0.05 %, Frequency:BID   Dosage:0.0     Instructions:  Note:Dose: 0.05 %, Disp: , Rfl:  .  cloNIDine (CATAPRES) 0.2 MG tablet, Take 0.2 mg by mouth daily., Disp: , Rfl:  .  cyclobenzaprine (FLEXERIL) 10 MG tablet, Take 1 tablet (10 mg total) by mouth at bedtime as needed for muscle spasms. (Patient not taking: Reported on 09/07/2018), Disp: 30 tablet, Rfl: 2 .  divalproex (DEPAKOTE) 250 MG DR tablet, Take 250 mg by mouth 3 (three) times daily., Disp: , Rfl:  .  estradiol (ESTRACE) 1 MG tablet, TAKE 1 TABLET BY MOUTH DAILY, Disp: 30 tablet, Rfl: 12 .  etodolac (LODINE) 400 MG tablet, Take 400 mg by mouth 2 (two) times daily., Disp: , Rfl:  .  fluticasone (FLONASE) 50 MCG/ACT nasal spray, SPRAY 1 SPRAY INTO EACH NOSTRIL ONCE DAILY., Disp: 16 g, Rfl: 3 .  losartan (COZAAR) 50 MG tablet, TAKE 1 TABLET (50 MG TOTAL) BY MOUTH DAILY., Disp: 30 tablet, Rfl: 5 .  lurasidone (LATUDA) 80 MG TABS tablet, Take 80 mg by mouth., Disp: , Rfl:  .  Melatonin 5 MG CAPS, Take by mouth., Disp: , Rfl:  .   nortriptyline (PAMELOR) 25 MG capsule, Take 25 mg by mouth at bedtime., Disp: , Rfl:  .  omeprazole (PRILOSEC) 40 MG capsule, TAKE 1 CAPSULE BY MOUTH ONCE DAILY. TAKE 30 MINUTES PRIOR TO BREAKFAST (Patient taking differently: Take 40 mg by mouth 2 (two) times a day. ), Disp: 30 capsule, Rfl: 5 .  pregabalin (LYRICA) 150 MG capsule, Take by mouth., Disp: , Rfl:  .  promethazine (PHENERGAN) 25 MG tablet, TAKE 1 TABLET BY MOUTH EVERY SIX HOURS AS NEEDED FOR NAUSEA VOMITING, Disp: 30 tablet, Rfl: 2 .  propranolol ER (INDERAL LA) 120 MG 24 hr capsule, TAKE 1 CAPSULE (120 MG TOTAL) BY MOUTH DAILY., Disp: 30 capsule, Rfl: 5 .  senna (SENOKOT) 8.6 MG TABS tablet, Take 1 tablet by mouth daily., Disp: , Rfl:  .  SPIRIVA HANDIHALER 18 MCG inhalation capsule, PLACE 1 CAPSULE (18 MCG TOTAL) INTO INHALER AND INHALE DAILY., Disp: 30 capsule, Rfl: 5 .  valACYclovir (VALTREX) 1000 MG tablet, TAKE 1 TABLET BY MOUTH DAILY, Disp: 30 tablet, Rfl: 3 .  varenicline (CHANTIX CONTINUING MONTH PAK) 1 MG tablet, Take  1 tablet (1 mg total) by mouth 2 (two) times daily., Disp: 60 tablet, Rfl: 1   Allergies:  Piper and Tape      LABORATORY PANEL:   CBC No results for input(s): WBC, HGB, HCT, PLT in the last 168 hours. ------------------------------------------------------------------------------------------------------------------  Chemistries  No results for input(s): NA, K, CL, CO2, GLUCOSE, BUN, CREATININE, CALCIUM, MG, AST, ALT, ALKPHOS, BILITOT in the last 168 hours.  Invalid input(s): GFRCGP ------------------------------------------------------------------------------------------------------------------  Cardiac Enzymes No results for input(s): TROPONINI in the last 168 hours. ------------------------------------------------------------  RADIOLOGY:  No results found.     Thank  you for the consultation and for allowing [facility] to assist in the care of your patient. Our  recommendations are noted above.  Please contact us if we can be of further service.   Marda Stalker, M.D., F.C.C.P.  Board Certified in Internal Medicine, Pulmonary Medicine, [place], and Sleep Medicine.  [facility] Office Number: [phone]   10/04/2018

## 2018-10-05 ENCOUNTER — Ambulatory Visit: Payer: Medicaid Other | Admitting: Internal Medicine

## 2018-10-05 ENCOUNTER — Encounter: Payer: Self-pay | Admitting: Internal Medicine

## 2018-10-05 ENCOUNTER — Other Ambulatory Visit: Payer: Self-pay

## 2018-10-05 VITALS — BP 142/102 | HR 94 | Temp 97.7°F | Ht 59.0 in | Wt 202.0 lb

## 2018-10-05 DIAGNOSIS — G4733 Obstructive sleep apnea (adult) (pediatric): Secondary | ICD-10-CM

## 2018-10-05 DIAGNOSIS — J449 Chronic obstructive pulmonary disease, unspecified: Secondary | ICD-10-CM

## 2018-10-05 DIAGNOSIS — F17210 Nicotine dependence, cigarettes, uncomplicated: Secondary | ICD-10-CM | POA: Diagnosis not present

## 2018-10-05 NOTE — Patient Instructions (Signed)
--  Quitting smoking is the most important thing that you can do for your health.  --Quitting smoking will have greater affect on your health than any medicine that we can give you.   Continue to use cpap every night.  Continue spiriva once daily, and albuterol as needed.  Weight loss would help your breathing.

## 2018-10-07 ENCOUNTER — Ambulatory Visit (INDEPENDENT_AMBULATORY_CARE_PROVIDER_SITE_OTHER): Payer: Medicaid Other | Admitting: Obstetrics & Gynecology

## 2018-10-07 ENCOUNTER — Encounter: Payer: Self-pay | Admitting: Obstetrics & Gynecology

## 2018-10-07 ENCOUNTER — Other Ambulatory Visit: Payer: Self-pay

## 2018-10-07 VITALS — BP 140/98 | Ht 59.0 in | Wt 200.0 lb

## 2018-10-07 DIAGNOSIS — R232 Flushing: Secondary | ICD-10-CM

## 2018-10-07 DIAGNOSIS — Z0000 Encounter for general adult medical examination without abnormal findings: Secondary | ICD-10-CM

## 2018-10-07 DIAGNOSIS — Z1239 Encounter for other screening for malignant neoplasm of breast: Secondary | ICD-10-CM

## 2018-10-07 DIAGNOSIS — Z01419 Encounter for gynecological examination (general) (routine) without abnormal findings: Secondary | ICD-10-CM

## 2018-10-07 MED ORDER — CLONIDINE HCL 0.2 MG PO TABS: 0.2000 mg | ORAL_TABLET | Freq: Every day | ORAL | 3 refills | Status: DC

## 2018-10-07 MED ORDER — ESTRADIOL 1 MG PO TABS: 1.0000 mg | ORAL_TABLET | Freq: Every day | ORAL | 3 refills | Status: DC

## 2018-10-07 NOTE — Progress Notes (Signed)
HPI:      Darlene Barber is a 53 y.o. [phone] who LMP was in the past s/p cesarean supracervical hysterectomy 25 yrs ago, she presents today for her annual examination.  The patient has no complaints today, improved bladder function w Botox injections at urologist.  Some breast itching, no mass.  Hot flashes controlled w meds. The patient is not currently sexually active. Herlast pap: approximate date 2019 and was normal and last mammogram: approximate date 2019 and was normal.  The patient does perform self breast exams.  There is no notable family history of breast or ovarian cancer in her family. The patient is taking hormone replacement therapy. Patient denies post-menopausal vaginal bleeding.   The patient has regular exercise: yes. The patient denies current symptoms of depression.    GYN Hx: Last Colonoscopy:1 year ago. Normal.  Last DEXA: never ago.    PMHx: Past Medical History:  Diagnosis Date  . Allergy   . Anxiety   . Bursitis of both hips   . Depression   . Frequent headaches   . Obesity   . Sleep apnea    doesn't use CPAP machine broken,    Past Surgical History:  Procedure Laterality Date  . CESAREAN SECTION     x2  . CHOLECYSTECTOMY    . COLONOSCOPY WITH PROPOFOL N/A 11/20/2017   Procedure: COLONOSCOPY WITH PROPOFOL;  Surgeon: Lin Landsman, MD;  Location: [facility] ENDOSCOPY;  Service: Gastroenterology;  Laterality: N/A;  . HYSTERECTOMY ABDOMINAL WITH SALPINGECTOMY  1998  . HYSTEROSCOPY     Family History  Problem Relation Age of Onset  . Heart failure Maternal Grandmother   . Alzheimer's disease Maternal Grandfather   . Emphysema Paternal Grandmother   . Hodgkin's lymphoma Mother   . Diabetes Maternal Aunt   . Diabetes Maternal Uncle   . Cancer Other   . Breast cancer Neg Hx    Social History   Tobacco Use  . Smoking status: Current Every Day Smoker    Packs/day: 0.50    Years: 30.00    Pack years: 15.00    Types: Cigarettes  . Smokeless  tobacco: Never Used  Substance Use Topics  . Alcohol use: Yes    Comment: occ once per year  . Drug use: Yes    Types: Marijuana    Comment: Current use    Current Outpatient Medications:  .  albuterol (VENTOLIN HFA) 108 (90 Base) MCG/ACT inhaler, INHALE 2 PUFFS INTO LUNGS EVERY FOUR HOURS AS NEEDED FOR WHEEZING SHORTNESS OF BREATH, Disp: 8.5 g, Rfl: 2 .  benztropine (COGENTIN) 0.5 MG tablet, Take 0.5 mg by mouth 2 (two) times daily., Disp: , Rfl:  .  BYDUREON 2 MG PEN, INJECT 2 MG INTO THE SKIN ONCE A WEEK., Disp: 4 each, Rfl: 5 .  clobetasol cream (TEMOVATE) 0.05 %, Frequency:BID   Dosage:0.0     Instructions:  Note:Dose: 0.05 %, Disp: , Rfl:  .  cloNIDine (CATAPRES) 0.2 MG tablet, Take 1 tablet (0.2 mg total) by mouth daily., Disp: 90 tablet, Rfl: 3 .  cyclobenzaprine (FLEXERIL) 10 MG tablet, Take 1 tablet (10 mg total) by mouth at bedtime as needed for muscle spasms., Disp: 30 tablet, Rfl: 2 .  divalproex (DEPAKOTE) 250 MG DR tablet, Take 250 mg by mouth 3 (three) times daily., Disp: , Rfl:  .  estradiol (ESTRACE) 1 MG tablet, Take 1 tablet (1 mg total) by mouth daily., Disp: 90 tablet, Rfl: 3 .  etodolac (LODINE) 400  MG tablet, Take 400 mg by mouth 2 (two) times daily., Disp: , Rfl:  .  fluticasone (FLONASE) 50 MCG/ACT nasal spray, SPRAY 1 SPRAY INTO EACH NOSTRIL ONCE DAILY., Disp: 16 g, Rfl: 3 .  losartan (COZAAR) 50 MG tablet, TAKE 1 TABLET (50 MG TOTAL) BY MOUTH DAILY., Disp: 30 tablet, Rfl: 5 .  lurasidone (LATUDA) 80 MG TABS tablet, Take 80 mg by mouth., Disp: , Rfl:  .  Melatonin 5 MG CAPS, Take by mouth., Disp: , Rfl:  .  nortriptyline (PAMELOR) 25 MG capsule, Take 25 mg by mouth at bedtime., Disp: , Rfl:  .  omeprazole (PRILOSEC) 40 MG capsule, TAKE 1 CAPSULE BY MOUTH ONCE DAILY. TAKE 30 MINUTES PRIOR TO BREAKFAST (Patient taking differently: Take 40 mg by mouth 2 (two) times a day. ), Disp: 30 capsule, Rfl: 5 .  promethazine (PHENERGAN) 25 MG tablet, TAKE 1 TABLET BY MOUTH  EVERY SIX HOURS AS NEEDED FOR NAUSEA VOMITING, Disp: 30 tablet, Rfl: 2 .  propranolol ER (INDERAL LA) 120 MG 24 hr capsule, TAKE 1 CAPSULE (120 MG TOTAL) BY MOUTH DAILY., Disp: 30 capsule, Rfl: 5 .  senna (SENOKOT) 8.6 MG TABS tablet, Take 1 tablet by mouth daily., Disp: , Rfl:  .  SPIRIVA HANDIHALER 18 MCG inhalation capsule, PLACE 1 CAPSULE (18 MCG TOTAL) INTO INHALER AND INHALE DAILY., Disp: 30 capsule, Rfl: 5 .  valACYclovir (VALTREX) 1000 MG tablet, TAKE 1 TABLET BY MOUTH DAILY, Disp: 30 tablet, Rfl: 3 .  varenicline (CHANTIX CONTINUING MONTH PAK) 1 MG tablet, Take 1 tablet (1 mg total) by mouth 2 (two) times daily., Disp: 60 tablet, Rfl: 1 Allergies: Piper and Tape  Review of Systems  Constitutional: Negative for chills, fever and malaise/fatigue.  HENT: Negative for congestion, sinus pain and sore throat.   Eyes: Negative for blurred vision and pain.  Respiratory: Negative for cough and wheezing.   Cardiovascular: Negative for chest pain and leg swelling.  Gastrointestinal: Negative for abdominal pain, constipation, diarrhea, heartburn, nausea and vomiting.  Genitourinary: Negative for dysuria, frequency, hematuria and urgency.  Musculoskeletal: Negative for back pain, joint pain, myalgias and neck pain.  Skin: Negative for itching and rash.  Neurological: Negative for dizziness, tremors and weakness.  Endo/Heme/Allergies: Does not bruise/bleed easily.  Psychiatric/Behavioral: Negative for depression. The patient is not nervous/anxious and does not have insomnia.     Objective: BP (!) 140/98   Ht 4\' 11"  (1.499 m)   Wt 200 lb (90.7 kg)   BMI 40.40 kg/m   Filed Weights   10/07/18 1358  Weight: 200 lb (90.7 kg)   Body mass index is 40.4 kg/m. Physical Exam Constitutional:      General: She is not in acute distress.    Appearance: She is well-developed.  Genitourinary:     Pelvic exam was performed with patient supine.     Vulva, urethra, bladder, vagina, cervix and rectum  normal.     No lesions in the vagina.     No vaginal bleeding.     No cervical polyp.     Uterus is absent.     No right or left adnexal mass present.     Right adnexa not tender.     Left adnexa not tender.  HENT:     Head: Normocephalic and atraumatic. No laceration.     Right Ear: Hearing normal.     Left Ear: Hearing normal.     Mouth/Throat:     Pharynx: Uvula midline.  Eyes:  Pupils: Pupils are equal, round, and reactive to light.  Neck:     Musculoskeletal: Normal range of motion and neck supple.     Thyroid: No thyromegaly.  Cardiovascular:     Rate and Rhythm: Normal rate and regular rhythm.     Heart sounds: No murmur. No friction rub. No gallop.   Pulmonary:     Effort: Pulmonary effort is normal. No respiratory distress.     Breath sounds: Normal breath sounds. No wheezing.  Chest:     Breasts:        Right: No mass, skin change or tenderness.        Left: No mass, skin change or tenderness.  Abdominal:     General: Bowel sounds are normal. There is no distension.     Palpations: Abdomen is soft.     Tenderness: There is no abdominal tenderness. There is no rebound.  Musculoskeletal: Normal range of motion.  Neurological:     Mental Status: She is alert and oriented to person, place, and time.     Cranial Nerves: No cranial nerve deficit.  Skin:    General: Skin is warm and dry.  Psychiatric:        Judgment: Judgment normal.  Vitals signs reviewed.     Assessment: Annual Exam 1. Women's annual routine gynecological examination   2. Screening for breast cancer   3. Vasomotor flushing     Plan:            1.  Cervical Screening-  Pap smear schedule reviewed with patient  2. Breast screening- Exam annually and mammogram scheduled  3. Colonoscopy every 10 years, Hemoccult testing after age 17  4. Labs managed by PCP  5. Counseling for hormonal therapy: no change in therapy today              6. FRAX - FRAX score for assessing the 10 year  probability for fracture calculated and discussed today.  Based on age and score today, DEXA is not currently scheduled.    F/U  Return in about 1 year (around 10/07/2019) for Annual.  Barnett Applebaum, MD, [facility], [facility] 10/07/2018  2:29 PM

## 2018-10-07 NOTE — Patient Instructions (Signed)
PAP every three years Mammogram every year    Call [phone] to schedule at [facility] Colonoscopy every 10 years Labs yearly (with PCP)

## 2018-10-10 DIAGNOSIS — G4733 Obstructive sleep apnea (adult) (pediatric): Secondary | ICD-10-CM | POA: Diagnosis not present

## 2018-10-16 ENCOUNTER — Other Ambulatory Visit: Payer: Self-pay | Admitting: Obstetrics & Gynecology

## 2018-10-16 ENCOUNTER — Other Ambulatory Visit: Payer: Self-pay | Admitting: Family Medicine

## 2018-10-16 DIAGNOSIS — E118 Type 2 diabetes mellitus with unspecified complications: Secondary | ICD-10-CM

## 2018-10-16 DIAGNOSIS — R232 Flushing: Secondary | ICD-10-CM

## 2018-10-29 DIAGNOSIS — R200 Anesthesia of skin: Secondary | ICD-10-CM | POA: Diagnosis not present

## 2018-10-29 DIAGNOSIS — M79602 Pain in left arm: Secondary | ICD-10-CM | POA: Diagnosis not present

## 2018-10-29 DIAGNOSIS — M542 Cervicalgia: Secondary | ICD-10-CM | POA: Diagnosis not present

## 2018-10-29 DIAGNOSIS — R51 Headache: Secondary | ICD-10-CM | POA: Diagnosis not present

## 2018-11-03 DIAGNOSIS — R32 Unspecified urinary incontinence: Secondary | ICD-10-CM | POA: Diagnosis not present

## 2018-11-10 DIAGNOSIS — G4733 Obstructive sleep apnea (adult) (pediatric): Secondary | ICD-10-CM | POA: Diagnosis not present

## 2018-11-18 ENCOUNTER — Other Ambulatory Visit: Payer: Self-pay | Admitting: Family Medicine

## 2018-11-18 ENCOUNTER — Ambulatory Visit
Admission: RE | Admit: 2018-11-18 | Discharge: 2018-11-18 | Disposition: A | Discharge status: Home / Self Care | Payer: Medicaid Other | Source: Ambulatory Visit | Attending: Obstetrics & Gynecology | Admitting: Obstetrics & Gynecology

## 2018-11-18 DIAGNOSIS — Z720 Tobacco use: Secondary | ICD-10-CM

## 2018-11-18 DIAGNOSIS — Z1231 Encounter for screening mammogram for malignant neoplasm of breast: Secondary | ICD-10-CM | POA: Insufficient documentation

## 2018-11-18 DIAGNOSIS — Z1239 Encounter for other screening for malignant neoplasm of breast: Secondary | ICD-10-CM

## 2018-11-18 DIAGNOSIS — F17210 Nicotine dependence, cigarettes, uncomplicated: Secondary | ICD-10-CM

## 2018-11-18 NOTE — Telephone Encounter (Signed)
Left pt mess to call 

## 2018-11-18 NOTE — Telephone Encounter (Signed)
I spoke to patient and explained that we have not received prior auth back from insurance and moved botox to 12-07-18

## 2018-11-19 ENCOUNTER — Encounter: Payer: Self-pay | Admitting: Obstetrics & Gynecology

## 2018-11-19 ENCOUNTER — Other Ambulatory Visit: Payer: Medicaid Other

## 2018-11-20 ENCOUNTER — Other Ambulatory Visit: Payer: Self-pay | Admitting: Family Medicine

## 2018-11-20 DIAGNOSIS — F17210 Nicotine dependence, cigarettes, uncomplicated: Secondary | ICD-10-CM

## 2018-11-20 DIAGNOSIS — Z720 Tobacco use: Secondary | ICD-10-CM

## 2018-11-20 NOTE — Telephone Encounter (Signed)
This is a second refill request for Chantix.  She should have already been on Chantix for >3 months now.  I only recommend to take it for 3 months.  I declined the last refill and this was one sent again today.  Could you call patient to ask if she is still on chantix, or why she still needs it - or maybe this is a pharmacy request - I would decline it unless she really needs it still  [name], [facility] 11/20/2018, 1:33 PM

## 2018-11-23 ENCOUNTER — Ambulatory Visit: Payer: Medicaid Other | Admitting: Urology

## 2018-11-24 ENCOUNTER — Other Ambulatory Visit: Payer: Self-pay

## 2018-11-24 DIAGNOSIS — E559 Vitamin D deficiency, unspecified: Secondary | ICD-10-CM | POA: Diagnosis not present

## 2018-11-24 DIAGNOSIS — Z0000 Encounter for general adult medical examination without abnormal findings: Secondary | ICD-10-CM | POA: Diagnosis not present

## 2018-11-24 DIAGNOSIS — E1169 Type 2 diabetes mellitus with other specified complication: Secondary | ICD-10-CM

## 2018-11-24 DIAGNOSIS — F319 Bipolar disorder, unspecified: Secondary | ICD-10-CM | POA: Diagnosis not present

## 2018-11-24 DIAGNOSIS — E785 Hyperlipidemia, unspecified: Secondary | ICD-10-CM | POA: Diagnosis not present

## 2018-11-24 DIAGNOSIS — Z6841 Body Mass Index (BMI) 40.0 and over, adult: Secondary | ICD-10-CM | POA: Diagnosis not present

## 2018-11-24 DIAGNOSIS — E118 Type 2 diabetes mellitus with unspecified complications: Secondary | ICD-10-CM

## 2018-11-24 DIAGNOSIS — I10 Essential (primary) hypertension: Secondary | ICD-10-CM

## 2018-11-25 LAB — LIPID PANEL
Cholesterol: 176 mg/dL (ref <200)
HDL: 38 mg/dL — ABNORMAL LOW (ref >=50)
LDL Cholesterol (Calc): 103 mg/dL (calc) — ABNORMAL HIGH
Non-HDL Cholesterol (Calc): 138 mg/dL (calc) — ABNORMAL HIGH (ref <130)
Total CHOL/HDL Ratio: 4.6 (calc) (ref <5.0)
Triglycerides: 231 mg/dL — ABNORMAL HIGH (ref <150)

## 2018-11-25 LAB — COMPLETE METABOLIC PANEL WITH GFR
AG Ratio: 1.4 (calc) (ref 1.0–2.5)
ALT: 18 U/L (ref 6–29)
AST: 18 U/L (ref 10–35)
Albumin: 3.9 g/dL (ref 3.6–5.1)
Alkaline phosphatase (APISO): 77 U/L (ref 37–153)
BUN: 13 mg/dL (ref 7–25)
CO2: 23 mmol/L (ref 20–32)
Calcium: 9.6 mg/dL (ref 8.6–10.4)
Chloride: 106 mmol/L (ref 98–110)
Creat: 0.74 mg/dL (ref 0.50–1.05)
GFR, Est African American: 108 mL/min/{1.73_m2} (ref >=60)
GFR, Est Non African American: 93 mL/min/{1.73_m2} (ref >=60)
Globulin: 2.7 g/dL (calc) (ref 1.9–3.7)
Glucose, Bld: 85 mg/dL (ref 65–99)
Potassium: 4.4 mmol/L (ref 3.5–5.3)
Sodium: 138 mmol/L (ref 135–146)
Total Bilirubin: 0.5 mg/dL (ref 0.2–1.2)
Total Protein: 6.6 g/dL (ref 6.1–8.1)

## 2018-11-25 LAB — HEMOGLOBIN A1C
Hgb A1c MFr Bld: 6 % of total Hgb — ABNORMAL HIGH (ref <5.7)
Mean Plasma Glucose: 126 (calc)
eAG (mmol/L): 7 (calc)

## 2018-11-25 LAB — CBC WITH DIFFERENTIAL/PLATELET
Absolute Monocytes: 543 cells/uL (ref 200–950)
Basophils Absolute: 92 cells/uL (ref 0–200)
Basophils Relative: 1 %
Eosinophils Absolute: 377 cells/uL (ref 15–500)
Eosinophils Relative: 4.1 %
HCT: 43.5 % (ref 35.0–45.0)
Hemoglobin: 15.3 g/dL (ref 11.7–15.5)
Lymphs Abs: 2999 cells/uL (ref 850–3900)
MCH: 34.3 pg — ABNORMAL HIGH (ref 27.0–33.0)
MCHC: 35.2 g/dL (ref 32.0–36.0)
MCV: 97.5 fL (ref 80.0–100.0)
MPV: 11.7 fL (ref 7.5–12.5)
Monocytes Relative: 5.9 %
Neutro Abs: 5189 cells/uL (ref 1500–7800)
Neutrophils Relative %: 56.4 %
Platelets: 316 10*3/uL (ref 140–400)
RBC: 4.46 10*6/uL (ref 3.80–5.10)
RDW: 12.8 % (ref 11.0–15.0)
Total Lymphocyte: 32.6 %
WBC: 9.2 10*3/uL (ref 3.8–10.8)

## 2018-11-25 LAB — VITAMIN D 25 HYDROXY (VIT D DEFICIENCY, FRACTURES): Vit D, 25-Hydroxy: 14 ng/mL — ABNORMAL LOW (ref 30–100)

## 2018-11-25 LAB — T4, FREE: Free T4: 1.4 ng/dL (ref 0.8–1.8)

## 2018-11-25 LAB — TSH: TSH: 1.19 mIU/L

## 2018-11-27 ENCOUNTER — Other Ambulatory Visit: Payer: Self-pay | Admitting: Family Medicine

## 2018-11-27 DIAGNOSIS — N3281 Overactive bladder: Secondary | ICD-10-CM

## 2018-11-30 ENCOUNTER — Other Ambulatory Visit: Payer: Medicaid Other

## 2018-11-30 ENCOUNTER — Other Ambulatory Visit: Payer: Self-pay

## 2018-11-30 DIAGNOSIS — N3281 Overactive bladder: Secondary | ICD-10-CM | POA: Diagnosis not present

## 2018-11-30 LAB — URINALYSIS, COMPLETE
Bilirubin, UA: NEGATIVE
Glucose, UA: NEGATIVE
Leukocytes,UA: NEGATIVE
Nitrite, UA: NEGATIVE
RBC, UA: NEGATIVE
Specific Gravity, UA: >1.03 — ABNORMAL HIGH (ref 1.005–1.030)
Urobilinogen, Ur: 1 mg/dL (ref 0.2–1.0)
pH, UA: 6 (ref 5.0–7.5)

## 2018-11-30 LAB — MICROSCOPIC EXAMINATION

## 2018-12-01 ENCOUNTER — Encounter: Payer: Self-pay | Admitting: Family Medicine

## 2018-12-01 ENCOUNTER — Ambulatory Visit (INDEPENDENT_AMBULATORY_CARE_PROVIDER_SITE_OTHER): Payer: Medicaid Other | Admitting: Family Medicine

## 2018-12-01 ENCOUNTER — Other Ambulatory Visit: Payer: Self-pay

## 2018-12-01 VITALS — BP 142/98 | HR 83 | Temp 98.5°F | Resp 18 | Ht 59.0 in | Wt 193.2 lb

## 2018-12-01 DIAGNOSIS — F319 Bipolar disorder, unspecified: Secondary | ICD-10-CM | POA: Diagnosis not present

## 2018-12-01 DIAGNOSIS — E785 Hyperlipidemia, unspecified: Secondary | ICD-10-CM | POA: Diagnosis not present

## 2018-12-01 DIAGNOSIS — E1169 Type 2 diabetes mellitus with other specified complication: Secondary | ICD-10-CM

## 2018-12-01 DIAGNOSIS — Z9989 Dependence on other enabling machines and devices: Secondary | ICD-10-CM

## 2018-12-01 DIAGNOSIS — F5104 Psychophysiologic insomnia: Secondary | ICD-10-CM | POA: Diagnosis not present

## 2018-12-01 DIAGNOSIS — Z23 Encounter for immunization: Secondary | ICD-10-CM

## 2018-12-01 DIAGNOSIS — Z0000 Encounter for general adult medical examination without abnormal findings: Secondary | ICD-10-CM

## 2018-12-01 DIAGNOSIS — E118 Type 2 diabetes mellitus with unspecified complications: Secondary | ICD-10-CM

## 2018-12-01 DIAGNOSIS — G4733 Obstructive sleep apnea (adult) (pediatric): Secondary | ICD-10-CM

## 2018-12-01 DIAGNOSIS — I10 Essential (primary) hypertension: Secondary | ICD-10-CM

## 2018-12-01 MED ORDER — TRAZODONE HCL 50 MG PO TABS: 50.0000 mg | ORAL_TABLET | Freq: Every day | ORAL | 2 refills | Status: DC

## 2018-12-01 MED ORDER — AMLODIPINE BESYLATE 10 MG PO TABS: ORAL_TABLET | ORAL | 2 refills | Status: DC

## 2018-12-01 NOTE — Patient Instructions (Addendum)
Thank you for coming to the office today.  Can try Fish Oil OTC Omega 3 - 1000mg  1-2 times a day with a meal. For Triglycerides.  Low Vitamin D on lab test - recommend Vitamin D3 5,000 units in 1 capsule daily for 12 weeks then reduce down to Vitamin D3 2,000 units daily for maintenance.  Continue Vitamin B12.  Recent Labs    05/22/18 0936 11/24/18 1008  HGBA1C 7.0* 6.0*    Keep up the good work on lifestyle and diet.  For blood pressure - start new BP med - Amlodipine - 10mg   Take HALF tablet for a dose of 5mg  for 2-4 weeks if needed can take the whole pill for 10mg . Should help headaches as well.  For sleep - can add new rx - Trazodone 50mg  nightly - this is about the highest dose we can do on it, due to other meds, ask your new psychiatry about your sleep and medications.  Please schedule a Follow-up Appointment to: Return in about 6 months (around 05/31/2019) for 6 month follow-up DM A1c, HTN, Psych updates insomnia.  If you have any other questions or concerns, please feel free to call the office or send a message through [place]. You may also schedule an earlier appointment if necessary.  Additionally, you may be receiving a survey about your experience at our office within a few days to 1 week by e-mail or mail. We value your feedback.  [name], DO [place]

## 2018-12-01 NOTE — Progress Notes (Signed)
Subjective:    Patient ID: Darlene Barber, female    DOB: January 16, 1966, 53 y.o.   MRN: [id number]  Darlene Barber is a 53 y.o. female presenting on 12/01/2018 for Annual Exam and Hypertension   HPI   Here for Annual Physical and Lab Review.  CHRONIC HTN: Still elevated BP attributed to stress. Current meds: Losartan 50mg  daily, Propranolol ER 120mg  daily Reports good compliance, took meds today. Tolerating well, w/o complaints. Lifestyle: - Exercise:Limited Denies CP, HA, edema, dizziness / lightheadedness  CHRONIC DM, Type 2: Reduced fried. Eating more vegetables. A1c down to 6 Meds: Bydureon Pen 2mg  weekly inj Reports  good compliance. Tolerating well w/o side-effects Currently on ARB Weight loss on Bydureon Denies hypoglycemia, polyuria, visual changes, numbness or tingling.  Left Wrist, Pain - Reports new recent problem past few weeks, left hand dominant, thinks she may have sprained it, no trauma or other injury, pain over side of wrist only, not wearing splint Denies numbness tingling weakness, redness swelling  OSA on CPAP Recently started on CPAP some difficulty sleeping with it   Bipolar Mood Disorder / Insomnia Previous Psych at [facility] - now physician passed away 2 months ago, awaiting on reassignment - had insomnia, was taking Melatonin up to x 3 at night, was helping her fall asleep easier but difficulty waking up overnight.   Active smoker / Tobacco Abuse Failed Chantix trial  Health Maintenance: Due for Flu Shot, will receive today    Depression screen [facility] 2/9 12/01/2018 08/21/2018 05/27/2018  Decreased Interest 0 0 0  Down, Depressed, Hopeless 1 0 0  PHQ - 2 Score 1 0 0  Altered sleeping 3 2 -  Tired, decreased energy 3 2 -  Change in appetite 1 3 -  Feeling bad or failure about yourself  0 0 -  Trouble concentrating 0 0 -  Moving slowly or fidgety/restless 0 0 -  Suicidal thoughts 0 0 -  PHQ-9 Score 8 7 -  Difficult doing work/chores  Somewhat difficult Not difficult at all -   GAD 7 : Generalized Anxiety Score 05/26/2017 08/20/2016  Nervous, Anxious, on Edge 2 3  Control/stop worrying 3 3  Worry too much - different things 2 3  Trouble relaxing 2 3  Restless 2 3  Easily annoyed or irritable 1 3  Afraid - awful might happen 1 3  Total GAD 7 Score 13 21  Anxiety Difficulty Not difficult at all Very difficult     Past Medical History:  Diagnosis Date  . Allergy   . Anxiety   . Bursitis of both hips   . Depression   . Frequent headaches   . Obesity   . Sleep apnea    doesn't use CPAP machine broken,    Past Surgical History:  Procedure Laterality Date  . CESAREAN SECTION     x2  . CHOLECYSTECTOMY    . COLONOSCOPY WITH PROPOFOL N/A 11/20/2017   Procedure: COLONOSCOPY WITH PROPOFOL;  Surgeon: Lin Landsman, MD;  Location: [facility] ENDOSCOPY;  Service: Gastroenterology;  Laterality: N/A;  . HYSTERECTOMY ABDOMINAL WITH SALPINGECTOMY  1998  . HYSTEROSCOPY     Social History   Socioeconomic History  . Marital status: Divorced    Spouse name: Not on file  . Number of children: Not on file  . Years of education: [employer]  . Highest education level: Not on file  Occupational History  . Occupation: Unemployed  Social Needs  . Financial resource strain: Not on file  .  Food insecurity    Worry: Not on file    Inability: Not on file  . Transportation needs    Medical: Not on file    Non-medical: Not on file  Tobacco Use  . Smoking status: Current Every Day Smoker    Packs/day: 0.50    Years: 30.00    Pack years: 15.00    Types: Cigarettes  . Smokeless tobacco: Never Used  Substance and Sexual Activity  . Alcohol use: Yes    Comment: occ once per year  . Drug use: Yes    Types: Marijuana    Comment: Current use  . Sexual activity: Not Currently  Lifestyle  . Physical activity    Days per week: Not on file    Minutes per session: Not on file  . Stress: Not on file  Relationships  . Social  Herbalist on phone: Not on file    Gets together: Not on file    Attends religious service: Not on file    Active member of club or organization: Not on file    Attends meetings of clubs or organizations: Not on file    Relationship status: Not on file  . Intimate partner violence    Fear of current or ex partner: Not on file    Emotionally abused: Not on file    Physically abused: Not on file    Forced sexual activity: Not on file  Other Topics Concern  . Not on file  Social History Narrative  . Not on file   Family History  Problem Relation Age of Onset  . Heart failure Maternal Grandmother   . Alzheimer's disease Maternal Grandfather   . Emphysema Paternal Grandmother   . Hodgkin's lymphoma Mother   . Diabetes Maternal Aunt   . Diabetes Maternal Uncle   . Cancer Other   . Breast cancer Neg Hx    Current Outpatient Medications on File Prior to Visit  Medication Sig  . albuterol (VENTOLIN HFA) 108 (90 Base) MCG/ACT inhaler INHALE 2 PUFFS INTO LUNGS EVERY FOUR HOURS AS NEEDED FOR WHEEZING SHORTNESS OF BREATH  . benztropine (COGENTIN) 0.5 MG tablet Take 0.5 mg by mouth 2 (two) times daily.  [name] BYDUREON 2 MG PEN INJECT 2 MG INTO THE SKIN ONCE A WEEK.  . clobetasol cream (TEMOVATE) 0.05 % Frequency:BID   Dosage:0.0     Instructions:  Note:Dose: 0.05 %  . cloNIDine (CATAPRES) 0.2 MG tablet TAKE 1 TABLET (0.2 MG TOTAL) BY MOUTH DAILY.  . cyclobenzaprine (FLEXERIL) 10 MG tablet Take 1 tablet (10 mg total) by mouth at bedtime as needed for muscle spasms.  [name] estradiol (ESTRACE) 1 MG tablet Take 1 tablet (1 mg total) by mouth daily.  [name] etodolac (LODINE) 400 MG tablet Take 400 mg by mouth 2 (two) times daily.  . fluticasone (FLONASE) 50 MCG/ACT nasal spray SPRAY 1 SPRAY INTO EACH NOSTRIL ONCE DAILY.  [name] losartan (COZAAR) 50 MG tablet Take by mouth.  . lurasidone (LATUDA) 80 MG TABS tablet Take 80 mg by mouth.  . nortriptyline (PAMELOR) 25 MG capsule Take 25 mg by mouth at  bedtime.  [name] omeprazole (PRILOSEC) 40 MG capsule TAKE 1 CAPSULE BY MOUTH ONCE DAILY. TAKE 30 MINUTES PRIOR TO BREAKFAST (Patient taking differently: Take 40 mg by mouth 2 (two) times a day. )  . pregabalin (LYRICA) 100 MG capsule Take by mouth.  . promethazine (PHENERGAN) 25 MG tablet TAKE 1 TABLET BY MOUTH EVERY SIX HOURS AS  NEEDED FOR NAUSEA VOMITING  . propranolol ER (INDERAL LA) 120 MG 24 hr capsule TAKE 1 CAPSULE (120 MG TOTAL) BY MOUTH DAILY.  [name] senna (SENOKOT) 8.6 MG TABS tablet Take 1 tablet by mouth daily.  [name] SPIRIVA HANDIHALER 18 MCG inhalation capsule PLACE 1 CAPSULE (18 MCG TOTAL) INTO INHALER AND INHALE DAILY.  . valACYclovir (VALTREX) 1000 MG tablet TAKE 1 TABLET BY MOUTH DAILY  . vitamin B-12 (CYANOCOBALAMIN) 500 MCG tablet Take 500 mcg by mouth daily.  . Melatonin 5 MG CAPS Take by mouth.   No current facility-administered medications on file prior to visit.     Review of Systems  Constitutional: Negative for activity change, appetite change, chills, diaphoresis, fatigue and fever.  HENT: Negative for congestion and hearing loss.   Eyes: Negative for visual disturbance.  Respiratory: Negative for apnea, cough, choking, chest tightness, shortness of breath and wheezing.   Cardiovascular: Negative for chest pain, palpitations and leg swelling.  Gastrointestinal: Negative for abdominal pain, anal bleeding, blood in stool, constipation, diarrhea, nausea and vomiting.  Endocrine: Negative for cold intolerance.  Genitourinary: Negative for difficulty urinating, dysuria, frequency and hematuria.  Musculoskeletal: Negative for arthralgias, back pain and neck pain.  Skin: Negative for rash.  Allergic/Immunologic: Negative for environmental allergies.  Neurological: Negative for dizziness, weakness, light-headedness, numbness and headaches.  Hematological: Negative for adenopathy.  Psychiatric/Behavioral: Positive for dysphoric mood and sleep disturbance. Negative for behavioral  problems. The patient is nervous/anxious.    Per HPI unless specifically indicated above      Objective:    BP (!) 142/98 (BP Location: Left Arm, Cuff Size: Normal)   Pulse 83   Temp 98.5 F (36.9 C) (Oral)   Resp 18   Ht 4\' 11"  (1.499 m)   Wt 193 lb 3.2 oz (87.6 kg)   SpO2 100%   BMI 39.02 kg/m   Wt Readings from Last 3 Encounters:  12/01/18 193 lb 3.2 oz (87.6 kg)  10/07/18 200 lb (90.7 kg)  10/05/18 202 lb (91.6 kg)    Physical Exam Vitals signs and nursing note reviewed.  Constitutional:      General: She is not in acute distress.    Appearance: She is well-developed. She is not diaphoretic.     Comments: Well-appearing, comfortable, cooperative  HENT:     Head: Normocephalic and atraumatic.  Eyes:     General:        Right eye: No discharge.        Left eye: No discharge.     Conjunctiva/sclera: Conjunctivae normal.  Neck:     Musculoskeletal: Normal range of motion and neck supple.     Thyroid: No thyromegaly.  Cardiovascular:     Rate and Rhythm: Normal rate and regular rhythm.     Heart sounds: Normal heart sounds. No murmur.  Pulmonary:     Effort: Pulmonary effort is normal. No respiratory distress.     Breath sounds: Normal breath sounds. No wheezing or rales.  Musculoskeletal: Normal range of motion.  Lymphadenopathy:     Cervical: No cervical adenopathy.  Skin:    General: Skin is warm and dry.     Findings: No erythema or rash.  Neurological:     Mental Status: She is alert and oriented to person, place, and time.  Psychiatric:        Behavior: Behavior normal.     Comments: Well groomed, good eye contact, normal speech and thoughts      Diabetic Foot Exam - Simple  Simple Foot Form Diabetic Foot exam was performed with the following findings: Yes 12/01/2018  2:25 PM  Visual Inspection No deformities, no ulcerations, no other skin breakdown bilaterally: Yes Sensation Testing Intact to touch and monofilament testing bilaterally: Yes Pulse  Check Posterior Tibialis and Dorsalis pulse intact bilaterally: Yes Comments     Recent Labs    05/22/18 0936 11/24/18 1008  HGBA1C 7.0* 6.0*      Chemistry      Component Value Date/Time   NA 138 11/24/2018 1008   NA 137 04/20/2012 1725   K 4.4 11/24/2018 1008   K 4.2 04/20/2012 1725   CL 106 11/24/2018 1008   CL 106 04/20/2012 1725   CO2 23 11/24/2018 1008   CO2 24 04/20/2012 1725   BUN 13 11/24/2018 1008   BUN 14 04/20/2012 1725   CREATININE 0.74 11/24/2018 1008      Component Value Date/Time   CALCIUM 9.6 11/24/2018 1008   CALCIUM 9.2 04/20/2012 1725   ALKPHOS 73 11/12/2016 1109   ALKPHOS 123 04/20/2012 1725   AST 18 11/24/2018 1008   AST 26 04/20/2012 1725   ALT 18 11/24/2018 1008   ALT 39 04/20/2012 1725   BILITOT 0.5 11/24/2018 1008   BILITOT 0.5 04/20/2012 1725     Lipid Panel     Component Value Date/Time   CHOL 176 11/24/2018 1008   TRIG 231 (H) 11/24/2018 1008   HDL 38 (L) 11/24/2018 1008   CHOLHDL 4.6 11/24/2018 1008   VLDL 45 (H) 11/12/2016 1109   LDLCALC 103 (H) 11/24/2018 1008    Results for orders placed or performed in visit on 11/30/18  Microscopic Examination   URINE  Result Value Ref Range   WBC, UA 0-5 0 - 5 /hpf   RBC 0-2 0 - 2 /hpf   Epithelial Cells (non renal) 0-10 0 - 10 /hpf   Casts Present (A) None seen /lpf   Cast Type Hyaline casts N/A   Mucus, UA Present (A) Not Estab.   Bacteria, UA Few None seen/Few  Urinalysis, Complete  Result Value Ref Range   Specific Gravity, UA >1.030 (H) 1.005 - 1.030   pH, UA 6.0 5.0 - 7.5   Color, UA Yellow Yellow   Appearance Ur Cloudy (A) Clear   Leukocytes,UA Negative Negative   Protein,UA 1+ (A) Negative/Trace   Glucose, UA Negative Negative   Ketones, UA Trace (A) Negative   RBC, UA Negative Negative   Bilirubin, UA Negative Negative   Urobilinogen, Ur 1.0 0.2 - 1.0 mg/dL   Nitrite, UA Negative Negative   Microscopic Examination See below:       Assessment & Plan:    Problem List Items Addressed This Visit    Bipolar depression ([place])   Relevant Medications   traZODone (DESYREL) 50 MG tablet   Controlled type 2 diabetes mellitus with complication, without long-term current use of insulin (HCC)    A1c down to 6 Without hyperglycemia Complications - other including hyperlipidemia specifically hypertriglyceridemia, GERD, depression, obesity, hypothyroidism, OSA - increases risk of future cardiovascular complications / poor glucose control due to reduced lifestyle diet/exercise with low energy mood and fatigue  Plan 1. Continue current therapy - Bydureon 2mg  weekly [place] injection 2. Encourage improved lifestyle - low carb, low sugar diet, reduce portion size, continue improving regular exercise 3 request diabetes eye exam - [place] 4. Follow-up 6 months      Relevant Medications   losartan (COZAAR) 50 MG tablet   Essential hypertension  Still elevated BP Attributed to stress among other factors  Add Amlodipine 5mg  daily (half of 10mg ) - if after 2-4 weeks need can increase dose Continue other BP meds      Relevant Medications   losartan (COZAAR) 50 MG tablet   amLODipine (NORVASC) 10 MG tablet   Hyperlipidemia associated with type 2 diabetes mellitus (HCC)   Relevant Medications   losartan (COZAAR) 50 MG tablet   Insomnia    Uncontrolled Poor sleep Not with psychiatry currently Order rx Trazodone nightly in addition to current meds.      Relevant Medications   traZODone (DESYREL) 50 MG tablet   OSA on CPAP    Well controlled, chronic OSA on CPAP - Good adherence to CPAP nightly - Continue current CPAP therapy, patient seems to be benefiting from therapy        Other Visit Diagnoses    Annual physical exam    -  Primary   Needs flu shot       Relevant Orders   Flu Vaccine QUAD 36+ mos IM   Need for immunization against influenza       Relevant Orders   Flu Vaccine QUAD 36+ mos IM (Completed)        Updated Health  Maintenance information Reviewed recent lab results with patient Encouraged improvement to lifestyle with diet and exercise - Goal of weight loss     Meds ordered this encounter  Medications  . traZODone (DESYREL) 50 MG tablet    Sig: Take 1 tablet (50 mg total) by mouth at bedtime.    Dispense:  30 tablet    Refill:  2  . amLODipine (NORVASC) 10 MG tablet    Sig: Start taking half pill daily for 2-4 weeks can increase up to 1 whole tab (10mg ) daily if needed for BP    Dispense:  30 tablet    Refill:  2     Follow up plan: Return in about 6 months (around 05/31/2019) for 6 month follow-up DM A1c, HTN, Psych updates insomnia.  [name], DO [facility] 12/01/2018, 2:04 PM

## 2018-12-02 NOTE — Assessment & Plan Note (Signed)
A1c down to 6 Without hyperglycemia Complications - other including hyperlipidemia specifically hypertriglyceridemia, GERD, depression, obesity, hypothyroidism, OSA - increases risk of future cardiovascular complications / poor glucose control due to reduced lifestyle diet/exercise with low energy mood and fatigue  Plan 1. Continue current therapy - Bydureon 2mg  weekly [place] injection 2. Encourage improved lifestyle - low carb, low sugar diet, reduce portion size, continue improving regular exercise 3 request diabetes eye exam - [place] 4. Follow-up 6 months

## 2018-12-02 NOTE — Assessment & Plan Note (Signed)
Well controlled, chronic OSA on CPAP - Good adherence to CPAP nightly - Continue current CPAP therapy, patient seems to be benefiting from therapy  

## 2018-12-02 NOTE — Assessment & Plan Note (Signed)
Still elevated BP Attributed to stress among other factors  Add Amlodipine 5mg  daily (half of 10mg ) - if after 2-4 weeks need can increase dose Continue other BP meds

## 2018-12-02 NOTE — Assessment & Plan Note (Signed)
Uncontrolled Poor sleep Not with psychiatry currently Order rx Trazodone nightly in addition to current meds.

## 2018-12-03 LAB — CULTURE, URINE COMPREHENSIVE

## 2018-12-04 DIAGNOSIS — F603 Borderline personality disorder: Secondary | ICD-10-CM | POA: Diagnosis not present

## 2018-12-04 MED ORDER — CIPROFLOXACIN HCL 500 MG PO TABS: 500.0000 mg | ORAL_TABLET | Freq: Two times a day (BID) | ORAL | 0 refills | Status: DC

## 2018-12-04 NOTE — Telephone Encounter (Signed)
Patient was notified that her urine culture was negative and to keep Botox apt on Monday she will need to take Cipro BID the day before the day of and the day after. This was sent to her pharmacy. Patient verbalized understanding

## 2018-12-07 ENCOUNTER — Other Ambulatory Visit: Payer: Self-pay

## 2018-12-07 ENCOUNTER — Ambulatory Visit (INDEPENDENT_AMBULATORY_CARE_PROVIDER_SITE_OTHER): Payer: Medicaid Other | Admitting: Urology

## 2018-12-07 ENCOUNTER — Encounter: Payer: Self-pay | Admitting: Urology

## 2018-12-07 VITALS — BP 115/88 | HR 92 | Ht 59.0 in | Wt 192.0 lb

## 2018-12-07 DIAGNOSIS — N3946 Mixed incontinence: Secondary | ICD-10-CM

## 2018-12-07 MED ORDER — ONABOTULINUMTOXINA 100 UNITS IJ SOLR
100.0000 [IU] | Freq: Once | INTRAMUSCULAR | Status: AC
  Administered 2018-12-07: 100 [IU] via INTRAMUSCULAR

## 2018-12-07 MED ORDER — LIDOCAINE HCL 2 % IJ SOLN
60.0000 mL | Freq: Once | INTRAMUSCULAR | Status: AC
  Administered 2018-12-07: 1200 mg

## 2018-12-07 NOTE — Addendum Note (Signed)
Addended by: Verlene Mayer A on: 12/07/2018 01:20 PM   Modules accepted: Orders

## 2018-12-07 NOTE — Progress Notes (Signed)
12/07/2018 11:27 AM   Darlene Barber 11/16/1965 [id number]  Referring provider: Olin Hauser, DO [address] [place],  [place] [address]  Chief Complaint  Patient presents with  . Over Active Bladder    HPI: Patient was consulted for worsening incontinence over 1 year especially since November. She leaks with coughing sneezing standing and bending. She has urge incontinence that is quite sudden. I think she has small volume bedwetting.She wears 3 pads a day that can be quite wet.  She voids every 2 or 3 hours and is no nocturia. Her flow is good.   Mild grade 2 hypermobility of the bladder neck with a negative cough test with a mild cough. Grade 1 cystocele and no rectocele  The patient has mixed incontinence and mild bedwetting. Both components seem to be quite significant.  On urodynamics the patient was catheterized for 100 mL. Bladder capacity was 622 mL. The bladder was unstable reaching a pressure of 5 cm of waterfelt asurgency but she did not leak. She did note that she leaks when she goes from a sitting to standing position and that her urges will hit her suddenly. She did not leak with a Valsalva pressure of 122 cm of water. During voluntary voiding she voided 575 mL. Maximal flow was 8 mils per second. Maximum voiding pressure is 38 semis water. She emptied efficiently. She was straining some. EMG activity increased during the voiding phase. Bladder neck descent at 1 or 2 cm.   The patient has mild stress incontinence and clinically an overactive bladder with urge incontinence and mild bedwetting. I will try to help her with medical and behavioral therapy. She may be a candidate to consider a sling in the future and I would want to re-quantitate her stress component. Ongoing OAB symptoms and bedwetting would need to be discussePelvic floor exercises and fluid modifications also started.  She did not respond to Myrbetriq Patient leaks  with coughing but not that much unless she has a bad cough with bronchitis. She is trying to stop smoking.   Reassess in 2 months. Detrol LA and oxybutynin ER 10 mg given. Re-quantitate goals and the role of a sling versus refractory therapy.  Patient has minimal stress incontinence unless she has bronchitis.  She still has urge incontinence with very little warning as her primary symptom.  Because of insurance we did not talk about percutaneous tibial nerve stimulation.  We talked about Botox and InterStim with usual templates.  She would like to try Botox first to try to avoid surgery.  She is a smoker and I will perform cystoscopy prior to the Botox to make certain her bladder is clear and this was discussed.  3 days ciprofloxacin prescription was given.  She has not had blood in the urine microscopically   Today Patient did really well with first Botox and will only start taking 3 weeks ago.  Clinically not infected.  Urge incontinence present. Cystoscopy: Patient underwent flexible cystoscopy.  Bladder mucosa and trigone were normal.  No cystitis.  I injected 100 units of Botox in 10 cc of normal saline with my usual template avoiding the trigone.  I used the middle two thirds of the bladder and kept in the lower third of the bladder.  She tolerated very well.  Minimal to no bleeding.   PMH: Past Medical History:  Diagnosis Date  . Allergy   . Anxiety   . Bursitis of both hips   . Depression   .  Frequent headaches   . Obesity   . Sleep apnea    doesn't use CPAP machine broken,     Surgical History: Past Surgical History:  Procedure Laterality Date  . CESAREAN SECTION     x2  . CHOLECYSTECTOMY    . COLONOSCOPY WITH PROPOFOL N/A 11/20/2017   Procedure: COLONOSCOPY WITH PROPOFOL;  Surgeon: Lin Landsman, MD;  Location: [facility] END[facility];  Service: Gastroenterology;  Laterality: N/A;  . HYSTERECTOMY ABDOMINAL WITH SALPINGECTOMY  1998  . HYSTEROSCOPY      Home  Medications:  Allergies as of 12/07/2018      Reactions   Piper Other (See Comments)   Black pepper Feels like throat is closing, itchy Feels like throat is closing, itchy Feels like throat is closing, itchy   Tape Rash   Paper tape is ok to use.      Medication List       Accurate as of December 07, 2018 11:27 AM. If you have any questions, ask your nurse or doctor.        albuterol 108 (90 Base) MCG/ACT inhaler Commonly known as: VENTOLIN HFA INHALE 2 PUFFS INTO LUNGS EVERY FOUR HOURS AS NEEDED FOR WHEEZING SHORTNESS OF BREATH   amLODipine 10 MG tablet Commonly known as: NORVASC Start taking half pill daily for 2-4 weeks can increase up to 1 whole tab (10mg ) daily if needed for BP   benztropine 0.5 MG tablet Commonly known as: COGENTIN Take 0.5 mg by mouth 2 (two) times daily.   Bydureon 2 MG Pen Generic drug: Exenatide ER INJECT 2 MG INTO THE SKIN ONCE A WEEK.   ciprofloxacin 500 MG tablet Commonly known as: CIPRO Take 1 tablet (500 mg total) by mouth every 12 (twelve) hours.   clobetasol cream 0.05 % Commonly known as: TEMOVATE Frequency:BID   Dosage:0.0     Instructions:  Note:Dose: 0.05 %   cloNIDine 0.2 MG tablet Commonly known as: CATAPRES TAKE 1 TABLET (0.2 MG TOTAL) BY MOUTH DAILY.   cyclobenzaprine 10 MG tablet Commonly known as: FLEXERIL Take 1 tablet (10 mg total) by mouth at bedtime as needed for muscle spasms.   estradiol 1 MG tablet Commonly known as: ESTRACE Take 1 tablet (1 mg total) by mouth daily.   etodolac 400 MG tablet Commonly known as: LODINE Take 400 mg by mouth 2 (two) times daily.   fluticasone 50 MCG/ACT nasal spray Commonly known as: FLONASE SPRAY 1 SPRAY INTO EACH NOSTRIL ONCE DAILY.   losartan 50 MG tablet Commonly known as: COZAAR Take by mouth.   lurasidone 80 MG Tabs tablet Commonly known as: LATUDA Take 80 mg by mouth.   Melatonin 5 MG Caps Take by mouth.   nortriptyline 25 MG capsule Commonly known as:  PAMELOR Take 25 mg by mouth at bedtime.   omeprazole 40 MG capsule Commonly known as: PRILOSEC TAKE 1 CAPSULE BY MOUTH ONCE DAILY. TAKE 30 MINUTES PRIOR TO BREAKFAST What changed: See the new instructions.   pregabalin 100 MG capsule Commonly known as: LYRICA Take by mouth.   promethazine 25 MG tablet Commonly known as: PHENERGAN TAKE 1 TABLET BY MOUTH EVERY SIX HOURS AS NEEDED FOR NAUSEA VOMITING   propranolol ER 120 MG 24 hr capsule Commonly known as: INDERAL LA TAKE 1 CAPSULE (120 MG TOTAL) BY MOUTH DAILY.   senna 8.6 MG Tabs tablet Commonly known as: SENOKOT Take 1 tablet by mouth daily.   Spiriva HandiHaler 18 MCG inhalation capsule Generic drug: tiotropium PLACE 1 CAPSULE (18  MCG TOTAL) INTO INHALER AND INHALE DAILY.   traZODone 50 MG tablet Commonly known as: DESYREL Take 1 tablet (50 mg total) by mouth at bedtime.   valACYclovir 1000 MG tablet Commonly known as: VALTREX TAKE 1 TABLET BY MOUTH DAILY   vitamin B-12 500 MCG tablet Commonly known as: CYANOCOBALAMIN Take 500 mcg by mouth daily.       Allergies:  Allergies  Allergen Reactions  . Piper Other (See Comments)    Black pepper Feels like throat is closing, itchy Feels like throat is closing, itchy Feels like throat is closing, itchy  . Tape Rash    Paper tape is ok to use.    Family History: Family History  Problem Relation Age of Onset  . Heart failure Maternal Grandmother   . Alzheimer's disease Maternal Grandfather   . Emphysema Paternal Grandmother   . Hodgkin's lymphoma Mother   . Diabetes Maternal Aunt   . Diabetes Maternal Uncle   . Cancer Other   . Breast cancer Neg Hx     Social History:  reports that she has been smoking cigarettes. She has a 15.00 pack-year smoking history. She has never used smokeless tobacco. She reports current alcohol use. She reports current drug use. Drug: Marijuana.  ROS:                                        Physical Exam:  BP 115/88   Pulse 92   Ht 4\' 11"  (1.499 m)   Wt 192 lb (87.1 kg)   BMI 38.78 kg/m   Constitutional:  Alert and oriented, No acute distress.  Laboratory Data: Lab Results  Component Value Date   WBC 9.2 11/24/2018   HGB 15.3 11/24/2018   HCT 43.5 11/24/2018   MCV 97.5 11/24/2018   PLT 316 11/24/2018    Lab Results  Component Value Date   CREATININE 0.74 11/24/2018    No results found for: PSA  No results found for: TESTOSTERONE  Lab Results  Component Value Date   HGBA1C 6.0 (H) 11/24/2018    Urinalysis    Component Value Date/Time   COLORURINE Yellow 04/20/2012 1725   APPEARANCEUR Cloudy (A) 11/30/2018 1055   LABSPEC 1.010 04/20/2012 1725   PHURINE 6.0 04/20/2012 1725   GLUCOSEU Negative 11/30/2018 1055   GLUCOSEU Negative 04/20/2012 1725   HGBUR Negative 04/20/2012 1725   BILIRUBINUR Negative 11/30/2018 1055   BILIRUBINUR Negative 04/20/2012 1725   KETONESUR Negative 04/20/2012 1725   PROTEINUR 1+ (A) 11/30/2018 1055   PROTEINUR Negative 04/20/2012 1725   NITRITE Negative 11/30/2018 1055   NITRITE Negative 04/20/2012 1725   LEUKOCYTESUR Negative 11/30/2018 1055   LEUKOCYTESUR Negative 04/20/2012 1725    Pertinent Imaging:   Assessment & Plan: The patient will have a nurse visit in 2 weeks with a residual and let us know when symptoms return with a phone call.  Otherwise nursing can have her see me in about 6 months after they assess her in 2  There are no diagnoses linked to this encounter.  No follow-ups on file.  Reece Packer, MD  [place] [address], [place], [place] [address] [phone]

## 2018-12-07 NOTE — Progress Notes (Signed)
BCG Bladder Instillation  Patient was cleaned and prepped in a sterile fashion with betadine and lidocaine 2% jelly was instilled into the urethra.  A 14FR catheter was inserted, urine return was noted 146ml, urine was yellow in color.  55ml of 2% Lidocaine was instilled into the bladder. The catheter was then removed. Patient tolerated well, no complications were noted  Performed by: Verlene Mayer, [place], CMA

## 2018-12-10 DIAGNOSIS — G4733 Obstructive sleep apnea (adult) (pediatric): Secondary | ICD-10-CM | POA: Diagnosis not present

## 2018-12-22 ENCOUNTER — Telehealth: Payer: Self-pay | Admitting: Internal Medicine

## 2018-12-23 ENCOUNTER — Other Ambulatory Visit: Payer: Self-pay | Admitting: Family Medicine

## 2018-12-23 DIAGNOSIS — M62830 Muscle spasm of back: Secondary | ICD-10-CM

## 2018-12-23 NOTE — Telephone Encounter (Signed)
Error

## 2018-12-24 ENCOUNTER — Ambulatory Visit (INDEPENDENT_AMBULATORY_CARE_PROVIDER_SITE_OTHER): Payer: Medicaid Other | Admitting: *Deleted

## 2018-12-24 ENCOUNTER — Other Ambulatory Visit: Payer: Self-pay

## 2018-12-24 DIAGNOSIS — N3946 Mixed incontinence: Secondary | ICD-10-CM

## 2018-12-24 DIAGNOSIS — N3281 Overactive bladder: Secondary | ICD-10-CM | POA: Diagnosis not present

## 2018-12-24 NOTE — Progress Notes (Signed)
Patient presented to clinic for bladder scan 83ml. Follow up as scheduled.

## 2018-12-29 ENCOUNTER — Telehealth: Payer: Self-pay | Admitting: Family Medicine

## 2018-12-29 DIAGNOSIS — F5104 Psychophysiologic insomnia: Secondary | ICD-10-CM

## 2018-12-29 DIAGNOSIS — F319 Bipolar disorder, unspecified: Secondary | ICD-10-CM

## 2018-12-29 MED ORDER — TRAZODONE HCL 100 MG PO TABS: 100.0000 mg | ORAL_TABLET | Freq: Every day | ORAL | 1 refills | Status: DC

## 2018-12-29 NOTE — Telephone Encounter (Signed)
Pt  Called said that trazodone was not working (still unable to sleep) wanted to know if you would call in  Something else.

## 2018-12-29 NOTE — Telephone Encounter (Signed)
Last visit 12/01/18.  She was started on dose of Trazodone 50mg  nightly for insomnia.  She has history of bipolar/depression.  Please advise her that next step is to double her dose for Trazodone from 50mg  up to 100mg  nightly to help with sleep.  New rx sent to pharmacy  She will need to max out this medication first before can consider other meds.  She has already tried the other rx I commonly use for sleep - Mirtazapine in the past.  Ultimately she needs to re-establish with [facility] to discuss her insomnia, as there are other medications that they can offer that I do not routinely use for patients with bipolar/depression.  [name], [facility] 12/29/2018, 5:52 PM

## 2018-12-30 NOTE — Telephone Encounter (Signed)
No VM set will try again.

## 2018-12-30 NOTE — Telephone Encounter (Signed)
Patient advised.

## 2019-01-06 DIAGNOSIS — R1310 Dysphagia, unspecified: Secondary | ICD-10-CM | POA: Diagnosis not present

## 2019-01-06 DIAGNOSIS — K5900 Constipation, unspecified: Secondary | ICD-10-CM | POA: Diagnosis not present

## 2019-01-06 DIAGNOSIS — K5909 Other constipation: Secondary | ICD-10-CM | POA: Diagnosis not present

## 2019-01-06 DIAGNOSIS — K219 Gastro-esophageal reflux disease without esophagitis: Secondary | ICD-10-CM | POA: Diagnosis not present

## 2019-01-06 DIAGNOSIS — R634 Abnormal weight loss: Secondary | ICD-10-CM | POA: Diagnosis not present

## 2019-01-07 ENCOUNTER — Other Ambulatory Visit: Payer: Self-pay | Admitting: Family Medicine

## 2019-01-07 NOTE — Telephone Encounter (Signed)
The pt was notified and she verbalize understanding.  

## 2019-01-07 NOTE — Telephone Encounter (Signed)
Pt called wanted to know if you right a new prescription for something that will not cause constipation, she said that her trazodone was causing constipation. She said that she was put on Linzess yesterday also. Pt call back # is  [phone].

## 2019-01-07 NOTE — Telephone Encounter (Signed)
See last telephone call on 12/29/18 for this same issue.  We offered to increase Trazodone from 50 to 100.  It can cause constipation it is a possible side effect.  Linzess is a treatment for constipation - so that could help.  Other stool softeners / miralax can help as well and are very safe.  From the last telephone call, I don't have any other meds for insomnia for her.  She will need to return to [facility] to discuss her insomnia, given that she has history of Bipolar and other mental health conditions that make insomnia hard for me to treat.  [name], DO [facility] 01/07/2019, 8:21 AM

## 2019-01-10 DIAGNOSIS — G4733 Obstructive sleep apnea (adult) (pediatric): Secondary | ICD-10-CM | POA: Diagnosis not present

## 2019-01-22 DIAGNOSIS — M7061 Trochanteric bursitis, right hip: Secondary | ICD-10-CM | POA: Diagnosis not present

## 2019-01-25 ENCOUNTER — Other Ambulatory Visit: Payer: Self-pay | Admitting: Family Medicine

## 2019-01-25 DIAGNOSIS — J410 Simple chronic bronchitis: Secondary | ICD-10-CM

## 2019-02-09 ENCOUNTER — Telehealth: Payer: Self-pay | Admitting: Family Medicine

## 2019-02-09 DIAGNOSIS — G4733 Obstructive sleep apnea (adult) (pediatric): Secondary | ICD-10-CM | POA: Diagnosis not present

## 2019-02-09 MED ORDER — CONTRAVE 8-90 MG PO TB12: ORAL_TABLET | ORAL | 0 refills | Status: DC

## 2019-02-09 NOTE — Telephone Encounter (Signed)
Please notify patient that:  We can try to send Contrave, please let her know that it is often high cost and difficult to get covered. It will likely require prior authorization.  She will need to let us know any specific weight loss interventions she has tried so far: -Such as - Weight watchers, or other programs we can submit with our prior authorization. - Or other weight loss medication she has tried.  [name], [facility] 02/09/2019, 12:09 PM

## 2019-02-09 NOTE — Telephone Encounter (Signed)
Pt called wanted to know if you would write a prescription for Contrave. PT  Call back # is  [phone]

## 2019-02-09 NOTE — Telephone Encounter (Signed)
Attempted to contact the patient no answer. Voicemail not set-up to leave a message.

## 2019-02-10 NOTE — Telephone Encounter (Signed)
The pt was notified that medicaid doesn't pay for any weight loss medication.

## 2019-02-17 ENCOUNTER — Other Ambulatory Visit: Payer: Self-pay | Admitting: Family Medicine

## 2019-02-17 ENCOUNTER — Other Ambulatory Visit: Payer: Self-pay | Admitting: Physical Medicine and Rehabilitation

## 2019-02-17 DIAGNOSIS — M7061 Trochanteric bursitis, right hip: Secondary | ICD-10-CM | POA: Diagnosis not present

## 2019-02-17 DIAGNOSIS — M1611 Unilateral primary osteoarthritis, right hip: Secondary | ICD-10-CM

## 2019-02-17 DIAGNOSIS — J410 Simple chronic bronchitis: Secondary | ICD-10-CM

## 2019-02-17 DIAGNOSIS — J0110 Acute frontal sinusitis, unspecified: Secondary | ICD-10-CM

## 2019-02-17 DIAGNOSIS — J3089 Other allergic rhinitis: Secondary | ICD-10-CM

## 2019-02-17 DIAGNOSIS — I10 Essential (primary) hypertension: Secondary | ICD-10-CM

## 2019-02-22 DIAGNOSIS — F603 Borderline personality disorder: Secondary | ICD-10-CM | POA: Diagnosis not present

## 2019-03-02 ENCOUNTER — Ambulatory Visit: Payer: Medicaid Other

## 2019-03-06 ENCOUNTER — Ambulatory Visit
Admission: RE | Admit: 2019-03-06 | Discharge: 2019-03-06 | Disposition: A | Discharge status: Home / Self Care | Payer: Medicaid Other | Source: Ambulatory Visit | Attending: Physical Medicine and Rehabilitation | Admitting: Physical Medicine and Rehabilitation

## 2019-03-06 DIAGNOSIS — S76311A Strain of muscle, fascia and tendon of the posterior muscle group at thigh level, right thigh, initial encounter: Secondary | ICD-10-CM | POA: Diagnosis not present

## 2019-03-06 DIAGNOSIS — M1611 Unilateral primary osteoarthritis, right hip: Secondary | ICD-10-CM | POA: Diagnosis not present

## 2019-03-12 DIAGNOSIS — G4733 Obstructive sleep apnea (adult) (pediatric): Secondary | ICD-10-CM | POA: Diagnosis not present

## 2019-03-16 DIAGNOSIS — M1611 Unilateral primary osteoarthritis, right hip: Secondary | ICD-10-CM | POA: Diagnosis not present

## 2019-03-16 DIAGNOSIS — M25551 Pain in right hip: Secondary | ICD-10-CM | POA: Diagnosis not present

## 2019-03-16 DIAGNOSIS — S76011A Strain of muscle, fascia and tendon of right hip, initial encounter: Secondary | ICD-10-CM | POA: Diagnosis not present

## 2019-03-16 DIAGNOSIS — G8929 Other chronic pain: Secondary | ICD-10-CM | POA: Diagnosis not present

## 2019-03-23 DIAGNOSIS — F603 Borderline personality disorder: Secondary | ICD-10-CM | POA: Diagnosis not present

## 2019-03-30 DIAGNOSIS — R200 Anesthesia of skin: Secondary | ICD-10-CM | POA: Diagnosis not present

## 2019-03-30 DIAGNOSIS — M542 Cervicalgia: Secondary | ICD-10-CM | POA: Diagnosis not present

## 2019-03-30 DIAGNOSIS — M79602 Pain in left arm: Secondary | ICD-10-CM | POA: Diagnosis not present

## 2019-03-30 DIAGNOSIS — R519 Headache, unspecified: Secondary | ICD-10-CM | POA: Diagnosis not present

## 2019-04-01 DIAGNOSIS — M1612 Unilateral primary osteoarthritis, left hip: Secondary | ICD-10-CM | POA: Diagnosis not present

## 2019-04-01 DIAGNOSIS — M25551 Pain in right hip: Secondary | ICD-10-CM | POA: Diagnosis not present

## 2019-04-01 DIAGNOSIS — M7062 Trochanteric bursitis, left hip: Secondary | ICD-10-CM | POA: Diagnosis not present

## 2019-04-01 DIAGNOSIS — M25552 Pain in left hip: Secondary | ICD-10-CM | POA: Diagnosis not present

## 2019-04-12 DIAGNOSIS — G4733 Obstructive sleep apnea (adult) (pediatric): Secondary | ICD-10-CM | POA: Diagnosis not present

## 2019-04-20 ENCOUNTER — Encounter: Payer: Self-pay | Admitting: Internal Medicine

## 2019-04-20 ENCOUNTER — Ambulatory Visit (INDEPENDENT_AMBULATORY_CARE_PROVIDER_SITE_OTHER): Payer: Medicaid Other | Admitting: Internal Medicine

## 2019-04-20 DIAGNOSIS — J441 Chronic obstructive pulmonary disease with (acute) exacerbation: Secondary | ICD-10-CM | POA: Diagnosis not present

## 2019-04-20 DIAGNOSIS — G4733 Obstructive sleep apnea (adult) (pediatric): Secondary | ICD-10-CM | POA: Diagnosis not present

## 2019-04-20 MED ORDER — PREDNISONE 20 MG PO TABS: 20.0000 mg | ORAL_TABLET | Freq: Every day | ORAL | 1 refills | Status: DC

## 2019-04-20 MED ORDER — AZITHROMYCIN 250 MG PO TABS: ORAL_TABLET | ORAL | 0 refills | Status: DC

## 2019-04-20 NOTE — Patient Instructions (Addendum)
Prednisone 20 mg daily for 10 days Z-Pak Continue Spiriva Continue albuterol as needed   Please stop smoking

## 2019-04-20 NOTE — Progress Notes (Addendum)
[facility] Consultation        I connected with the patient by telephone enabled telemedicine visit and verified that I am speaking with the correct person using two identifiers.    I discussed the limitations, risks, security and privacy concerns of performing an evaluation and management service by telemedicine and the availability of in-person appointments. I also discussed with the patient that there may be a patient responsible charge related to this service. The patient expressed understanding and agreed to proceed.  PATIENT AGREES AND CONFIRMS -YES   Other persons participating in the visit and their role in the encounter: Patient, nursing  This visit type was conducted due to national recommendations for restrictions regarding the COVID-19 Pandemic (e.g. social distancing).  This format is felt to be most appropriate for this patient at this time.  All issues noted in this document were discussed and addressed.     **CPAP download 07/07/2018-10/04/2018>> raw data personally reviewed.  Uses greater than 4 hours is 60/90 days (71%).  Average usage on days used is 6 hours 48 minutes, CPAP setting is 5-20.  Median pressure 10, 9th percentile pressure is 13.7, maximum pressure is 15.1.  Leaks are within normal limits.  Residual AHI is 2.6 overall this shows borderline compliance with CPAP with excellent control obstructive sleep apnea when used. **Sleep study 04/16/17 >> Sleep apnea.  **Sleep study 04/21/17>> CPAP titration study, recommended cpap at 14.  **PFT 09/24/2018>> tracings personally reviewed.  FVC is 64% predicted, FEV1 is 48% there is 42% improvement with bronchodilator therapy up to 68% predicted.  Ratio is 59% prebronchodilator and 73% postbronchodilator.  TLC is 104%, ratio 146%, DLCO is 100% predicted flow volume loop is mildly obstructed and restricted. -Overall prebronchodilator loop shows obstruction, however results are likely indicative of poor expiratory  effort.  Post bronchodilator shows significant improvement with ratio greater than 73%, mildly reduced volumes.  Suggestive of mild restriction and obstructive lung disease.  **Review of outside notes: Darlene Barber is established with [facility] for the management of GERD, possible SIBO, and abdominal pain. All prior office notes, lab results, imaging studies, and procedure reports reviewed as appropriate.  Summary of clinical data: -- 11/01/08: [place] abd complete - multiple gallstones, tiny hemangioma in right lobe of liver (< 1 cm). -- 11/25/08: cholecystectomy. -- 2011; CSY - normal; repeat in 2021. -- 05/31/09: CT A/P w/ contrast (indic: abd pain, diarrhea) - unremarkable. -- 01/05/10: Upper GI series w/ KUB (indic: GERD) - GERD into distal esophagus during reflux maneuver. -- 02/19/12: EGD (indic: epigastric pain, dysphagia) - unremarkable (pathology negative for EOE or celiac disease). -- 07/04/11: MBSS (indic: dysphagia) - trace laryngeal penetration with thin liquids, no evidence of aspiration. -- 12/03/13: EGD (indic: dysphagia, GERD, bloating) - normal esophagus (dilated), stomach, and duodenum. -- 09/23/16: CRP 6.4; ESR, tTG IgA, serum IgA, H pylori breath test negative.  GI Medications: Current: omeprazole 31m daily, ranitidine 3091mdaily, phenergan 2538m 6 hrs prn, docusate 200-300 mg nightly, MiraLAX 17 g daily. Non-Pharm: low-FODMAP diet, avoid carbonation/artificial sweeteners/straws, probiotics. Prior: doxycycline (2015 - empiric tx of possible SIBO).  Date: 04/20/2019  MRN# [id number] Barber/[id number] Follow up OSA and tobacco abuse  HPI:   +SOB/DOE +wheezing Does take Spiriva HandiHaler Using ALBUTEROL more frequently  Still Smoking Patient is NON-compliant with CPAP-states that she lives with boyfriend and is afraid of having roaches climb into machine  +COPD exacerbation +chills    Smoking Assessment  and Cessation  Counseling Upon further questioning, Patient smokes 1/2 ppd I have advised patient to quit/stop smoking as soon as possible due to high risk for multiple medical problems Patient is NOT willing to quit smoking I have advised patient that we can assist and have options of Nicotine replacement therapy. I also advised patient on behavioral therapy and can provide oral medication therapy in conjunction with the other therapies Follow up next Office visit  for assessment of smoking cessation Smoking cessation counseling advised for 4 minutes    Medication:    Current Outpatient Medications:  .  albuterol (VENTOLIN HFA) 108 (90 Base) MCG/ACT inhaler, INHALE 2 PUFFS INTO LUNGS EVERY FOUR HOURS AS NEEDED FOR WHEEZING SHORTNESS OF BREATH, Disp: 8.5 g, Rfl: 2 .  amLODipine (NORVASC) 10 MG tablet, TAKE 1/2 TABLET DAILY FOR 2-4 WEEKS CAN INCREASE UP TO TAKE 1 TABLET DAILY AS NEEDED BLOOD PRESSURE, Disp: 30 tablet, Rfl: 2 .  benztropine (COGENTIN) 0.5 MG tablet, Take 0.5 mg by mouth 2 (two) times daily., Disp: , Rfl:  .  BYDUREON 2 MG PEN, INJECT 2 MG INTO THE SKIN ONCE A WEEK., Disp: 4 each, Rfl: 5 .  ciprofloxacin (CIPRO) 500 MG tablet, Take 1 tablet (500 mg total) by mouth every 12 (twelve) hours., Disp: 6 tablet, Rfl: 0 .  clobetasol cream (TEMOVATE) 0.05 %, Frequency:BID   Dosage:0.0     Instructions:  Note:Dose: 0.05 %, Disp: , Rfl:  .  cloNIDine (CATAPRES) 0.2 MG tablet, TAKE 1 TABLET (0.2 MG TOTAL) BY MOUTH DAILY., Disp: 90 tablet, Rfl: 3 .  CONTRAVE 8-90 MG TB12, Start 1 tab daily. Increase dose every 7 days - next 1 tab twice daily, then 2 tab in AM, 1 tab in PM, then to max dose 2 tab twice daily, Disp: 70 tablet, Rfl: 0 .  cyclobenzaprine (FLEXERIL) 10 MG tablet, TAKE 1 TABLET BY MOUTH EVERY NIGHT AT BEDTIME AS NEEDED FOR MUSCLE SPASM, Disp: 30 tablet, Rfl: 2 .  estradiol (ESTRACE) 1 MG tablet, Take 1 tablet (1 mg total) by mouth daily., Disp: 90 tablet, Rfl: 3 .  etodolac (LODINE) 400 MG  tablet, Take 400 mg by mouth 2 (two) times daily., Disp: , Rfl:  .  fluticasone (FLONASE) 50 MCG/ACT nasal spray, SPRAY 1 SPRAY INTO EACH NOSTRIL ONCE DAILY., Disp: 16 g, Rfl: 3 .  losartan (COZAAR) 50 MG tablet, TAKE 1 TABLET (50 MG TOTAL) BY MOUTH DAILY., Disp: 30 tablet, Rfl: 5 .  lurasidone (LATUDA) 80 MG TABS tablet, Take 80 mg by mouth., Disp: , Rfl:  .  Melatonin 5 MG CAPS, Take by mouth., Disp: , Rfl:  .  nortriptyline (PAMELOR) 25 MG capsule, Take 25 mg by mouth at bedtime., Disp: , Rfl:  .  omeprazole (PRILOSEC) 40 MG capsule, TAKE 1 CAPSULE BY MOUTH ONCE DAILY. TAKE 30 MINUTES PRIOR TO BREAKFAST (Patient taking differently: Take 40 mg by mouth 2 (two) times a day. ), Disp: 30 capsule, Rfl: 5 .  pregabalin (LYRICA) 100 MG capsule, Take by mouth., Disp: , Rfl:  .  promethazine (PHENERGAN) 25 MG tablet, TAKE 1 TABLET BY MOUTH EVERY SIX HOURS AS NEEDED FOR NAUSEA VOMITING, Disp: 30 tablet, Rfl: 2 .  propranolol ER (INDERAL LA) 120 MG 24 hr capsule, TAKE 1 CAPSULE (120 MG TOTAL) BY MOUTH DAILY., Disp: 30 capsule, Rfl: 5 .  senna (SENOKOT) 8.6 MG TABS tablet, Take 1 tablet by mouth daily., Disp: , Rfl:  .  SPIRIVA HANDIHALER 18 MCG inhalation capsule,  PLACE 1 CAPSULE (18 MCG TOTAL) INTO INHALER AND INHALE DAILY., Disp: 30 capsule, Rfl: 5 .  traZODone (DESYREL) 100 MG tablet, Take 1 tablet (100 mg total) by mouth at bedtime., Disp: 30 tablet, Rfl: 1 .  valACYclovir (VALTREX) 1000 MG tablet, TAKE 1 TABLET BY MOUTH DAILY, Disp: 30 tablet, Rfl: 3 .  vitamin B-12 (CYANOCOBALAMIN) 500 MCG tablet, Take 500 mcg by mouth daily., Disp: , Rfl:    Allergies:  Piper and Tape   Review of Systems:  Gen:  Denies  fever, sweats, chills weight loss  HEENT: Denies blurred vision, double vision, ear pain, eye pain, hearing loss, nose bleeds, sore throat Cardiac:  No dizziness, chest pain or heaviness, chest tightness,edema, No JVD Resp:   +cough, +sputum production, +shortness of breath,+wheezing,  -hemoptysis,  Gi: Denies swallowing difficulty, stomach pain, nausea or vomiting, diarrhea, constipation, bowel incontinence Gu:  Denies bladder incontinence, burning urine Ext:   Denies Joint pain, stiffness or swelling Skin: Denies  skin rash, easy bruising or bleeding or hives Endoc:  Denies polyuria, polydipsia , polyphagia or weight change Psych:   Denies depression, insomnia or hallucinations  Other:  All other systems negative     Assessment and Plan:  Signs and symptoms of COPD exacerbation Prednisone 20 mg daily for 10 days 1 refill Z-Pak Continue Spiriva as prescribed Albuterol as needed Patient advised to avoid allergens Patient advised to avoid cockroaches   OSA Patient states that she feels much better when she is getting therapy I have advised that she return to her home avoid any type of allergens and to continue to using her auto CPAP therapy as prescribed  Smoking cessation strongly advised    COVID-19 EDUCATION: The signs and symptoms of COVID-19 were discussed with the patient and how to seek care for testing.  The importance of social distancing was discussed today. Hand Washing Techniques and avoid touching face was advised.     MEDICATION ADJUSTMENTS/LABS AND TESTS ORDERED: Prednisone 20 mg daily for 10 days Z-Pak Continue Spiriva Continue albuterol as needed   CURRENT MEDICATIONS REVIEWED AT LENGTH WITH PATIENT TODAY   Patient satisfied with Plan of action and management. All questions answered  Follow up in 6 months  Total time spent 23 minutes Corrin Parker, M.D.  [facility]  Medical Director [place] Director [facility] Cardio-Pulmonary Department

## 2019-04-20 NOTE — Addendum Note (Signed)
Addended by: Flora Lipps on: 04/20/2019 04:03 PM   Modules accepted: Orders, Level of Service

## 2019-04-21 ENCOUNTER — Other Ambulatory Visit: Payer: Self-pay | Admitting: Family Medicine

## 2019-04-21 DIAGNOSIS — E118 Type 2 diabetes mellitus with unspecified complications: Secondary | ICD-10-CM

## 2019-04-26 DIAGNOSIS — M25551 Pain in right hip: Secondary | ICD-10-CM | POA: Diagnosis not present

## 2019-04-26 DIAGNOSIS — M1611 Unilateral primary osteoarthritis, right hip: Secondary | ICD-10-CM | POA: Diagnosis not present

## 2019-04-26 DIAGNOSIS — G8929 Other chronic pain: Secondary | ICD-10-CM | POA: Diagnosis not present

## 2019-04-26 DIAGNOSIS — S76011D Strain of muscle, fascia and tendon of right hip, subsequent encounter: Secondary | ICD-10-CM | POA: Diagnosis not present

## 2019-05-10 DIAGNOSIS — G4733 Obstructive sleep apnea (adult) (pediatric): Secondary | ICD-10-CM | POA: Diagnosis not present

## 2019-05-24 ENCOUNTER — Other Ambulatory Visit: Payer: Self-pay

## 2019-05-24 ENCOUNTER — Ambulatory Visit: Payer: Medicaid Other | Admitting: Urology

## 2019-05-25 ENCOUNTER — Telehealth: Payer: Self-pay | Admitting: Family Medicine

## 2019-05-25 ENCOUNTER — Other Ambulatory Visit: Payer: Self-pay | Admitting: Family Medicine

## 2019-05-25 DIAGNOSIS — I10 Essential (primary) hypertension: Secondary | ICD-10-CM

## 2019-05-25 NOTE — Telephone Encounter (Signed)
Received prior approval for Bydureon BCise 2 mg/0.85 ML got it approved with approval # [id number] from 05/25/2019 till 05/19/2020. reference # [id number].

## 2019-05-26 ENCOUNTER — Other Ambulatory Visit: Payer: Self-pay | Admitting: Family Medicine

## 2019-05-26 DIAGNOSIS — J410 Simple chronic bronchitis: Secondary | ICD-10-CM

## 2019-05-31 ENCOUNTER — Ambulatory Visit: Payer: Medicaid Other | Admitting: Urology

## 2019-05-31 ENCOUNTER — Other Ambulatory Visit: Payer: Self-pay

## 2019-05-31 ENCOUNTER — Encounter: Payer: Self-pay | Admitting: Family Medicine

## 2019-05-31 ENCOUNTER — Ambulatory Visit: Payer: Medicaid Other | Admitting: Family Medicine

## 2019-05-31 ENCOUNTER — Ambulatory Visit (INDEPENDENT_AMBULATORY_CARE_PROVIDER_SITE_OTHER): Payer: Medicaid Other | Admitting: Family Medicine

## 2019-05-31 ENCOUNTER — Other Ambulatory Visit: Payer: Self-pay | Admitting: Family Medicine

## 2019-05-31 VITALS — BP 129/89 | HR 92 | Temp 98.6°F | Resp 16 | Ht 59.0 in | Wt 154.6 lb

## 2019-05-31 DIAGNOSIS — R232 Flushing: Secondary | ICD-10-CM | POA: Diagnosis not present

## 2019-05-31 DIAGNOSIS — F319 Bipolar disorder, unspecified: Secondary | ICD-10-CM

## 2019-05-31 DIAGNOSIS — I10 Essential (primary) hypertension: Secondary | ICD-10-CM

## 2019-05-31 DIAGNOSIS — E559 Vitamin D deficiency, unspecified: Secondary | ICD-10-CM

## 2019-05-31 DIAGNOSIS — F5104 Psychophysiologic insomnia: Secondary | ICD-10-CM | POA: Diagnosis not present

## 2019-05-31 DIAGNOSIS — J432 Centrilobular emphysema: Secondary | ICD-10-CM | POA: Diagnosis not present

## 2019-05-31 DIAGNOSIS — K219 Gastro-esophageal reflux disease without esophagitis: Secondary | ICD-10-CM | POA: Diagnosis not present

## 2019-05-31 DIAGNOSIS — E1169 Type 2 diabetes mellitus with other specified complication: Secondary | ICD-10-CM

## 2019-05-31 DIAGNOSIS — E118 Type 2 diabetes mellitus with unspecified complications: Secondary | ICD-10-CM | POA: Diagnosis not present

## 2019-05-31 DIAGNOSIS — G4733 Obstructive sleep apnea (adult) (pediatric): Secondary | ICD-10-CM

## 2019-05-31 DIAGNOSIS — E785 Hyperlipidemia, unspecified: Secondary | ICD-10-CM

## 2019-05-31 DIAGNOSIS — Z0000 Encounter for general adult medical examination without abnormal findings: Secondary | ICD-10-CM

## 2019-05-31 LAB — POCT GLYCOSYLATED HEMOGLOBIN (HGB A1C): Hemoglobin A1C: 5.7 % — AB (ref 4.0–5.6)

## 2019-05-31 MED ORDER — BYDUREON BCISE 2 MG/0.85ML ~~LOC~~ AUIJ: 2.0000 mg | AUTO-INJECTOR | SUBCUTANEOUS | 3 refills | Status: DC

## 2019-05-31 MED ORDER — LOSARTAN POTASSIUM 50 MG PO TABS: 50.0000 mg | ORAL_TABLET | Freq: Every day | ORAL | 1 refills | Status: DC

## 2019-05-31 MED ORDER — AMLODIPINE BESYLATE 10 MG PO TABS: 10.0000 mg | ORAL_TABLET | Freq: Every day | ORAL | 1 refills | Status: DC

## 2019-05-31 MED ORDER — OMEPRAZOLE 40 MG PO CPDR: 40.0000 mg | DELAYED_RELEASE_CAPSULE | Freq: Two times a day (BID) | ORAL | 1 refills | Status: DC

## 2019-05-31 MED ORDER — SYMBICORT 160-4.5 MCG/ACT IN AERO: 2.0000 | INHALATION_SPRAY | Freq: Two times a day (BID) | RESPIRATORY_TRACT | 5 refills | Status: DC

## 2019-05-31 NOTE — Assessment & Plan Note (Signed)
Chronic Centrilobular Emphysema COPD Seems poorly controlled Active smoker, goals to quit Suspect lack of improvement on Spiriva, may contribute to her hot flashes Trial on Symbicort now 160 dose 1-2 puff BID, start 1 puff twice a day then increase to 2, sample given new rx sent to pharmacy, on medicaid PDL

## 2019-05-31 NOTE — Assessment & Plan Note (Signed)
Improved HTN Controlled on current regimen. Losartan 50mg  daily, Propranolol ER 120mg  daily, Amlodipine 10mg 

## 2019-05-31 NOTE — Patient Instructions (Addendum)
Thank you for coming to the office today.  Check with Dr Kenton Kingfisher [facility]) about hot flashes. Clonidine and Estrogen hormone can be adjusted for hot flashes  Stop Spiriva, Switch to Symbicort  Refilled other meds.  DUE for FASTING BLOOD WORK (no food or drink after midnight before the lab appointment, only water or coffee without cream/sugar on the morning of)  SCHEDULE "Lab Only" visit in the morning at the clinic for lab draw in 6 MONTHS   - Make sure Lab Only appointment is at about 1 week before your next appointment, so that results will be available  For Lab Results, once available within 2-3 days of blood draw, you can can log in to MyChart online to view your results and a brief explanation. Also, we can discuss results at next follow-up visit.   Please schedule a Follow-up Appointment to: Return in about 6 months (around 12/01/2019) for Annual Physical.  If you have any other questions or concerns, please feel free to call the office or send a message through [place]. You may also schedule an earlier appointment if necessary.  Additionally, you may be receiving a survey about your experience at our office within a few days to 1 week by e-mail or mail. We value your feedback.  [name], DO [place]

## 2019-05-31 NOTE — Progress Notes (Signed)
Subjective:    Patient ID: Darlene Barber, female    DOB: 1965/09/02, 54 y.o.   MRN: [id number]  Darlene Barber is a 54 y.o. female presenting on 05/31/2019 for Hypertension   HPI   CHRONIC HTN: Last time added amlodipine Current meds: Losartan 50mg  daily, Propranolol ER 120mg  daily, Amlodipine 10mg  Reports good compliance, took meds today. Tolerating well, w/o complaints. Lifestyle: - Exercise:Limited Denies CP, HA, edema, dizziness / lightheadedness  CHRONIC DM, Type 2: Lifestyle improved. 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 Tolerating well w/o side-effects Currently on ARB Weight loss on Bydureon Down 40 lbs Denies hypoglycemia, polyuria, visual changes, numbness or tingling.  Centrilobular Emphysema / COPD Followed by  [facility] 04/2019, on prednisone, Zpak, Spiriva Seems spiriva is no longer helping, she is requesting to see primary care only instead of returning to Pulmonology  Bipolar Mood Disorder / Insomnia last time 11/2018 added Trazodone > increased in 12/2018 up to 100mg  nightly in past took Ambien briefly, did think it helped, does not know why it was changed. Off Trazodone, no longer helping Psych at [facility]  had insomnia, was taking Melatonin up to x 3 at night, was helping her fall asleep easier but difficulty waking up overnight.  Hot Flashes / Sensitivity to Heat Prior thyroid lab TSH normal. She describes chronic problem over >20+ years. Says she is in menopause and seeing GYN they have her on estrogen and clonidine, limited improvement, hasn't been back.    Depression screen [facility] 2/9 12/01/2018 08/21/2018 05/27/2018  Decreased Interest 0 0 0  Down, Depressed, Hopeless 1 0 0  PHQ - 2 Score 1 0 0  Altered sleeping 3 2 -  Tired, decreased energy 3 2 -    Change in appetite 1 3 -  Feeling bad or failure about yourself  0 0 -  Trouble concentrating 0 0 -  Moving slowly or fidgety/restless 0 0 -  Suicidal thoughts 0 0 -  PHQ-9 Score 8 7 -  Difficult doing work/chores Somewhat difficult Not difficult at all -   GAD 7 : Generalized Anxiety Score 05/26/2017 08/20/2016  Nervous, Anxious, on Edge 2 3  Control/stop worrying 3 3  Worry too much - different things 2 3  Trouble relaxing 2 3  Restless 2 3  Easily annoyed or irritable 1 3  Afraid - awful might happen 1 3  Total GAD 7 Score 13 21  Anxiety Difficulty Not difficult at all Very difficult     Social History   Tobacco Use  . Smoking status: Current Every Day Smoker    Packs/day: 0.50    Years: 30.00    Pack years: 15.00    Types: Cigarettes  . Smokeless tobacco: Never Used  Substance Use Topics  . Alcohol use: Yes    Comment: occ once per year  . Drug use: Yes    Types: Marijuana    Comment: Current use    Review of Systems Per HPI unless specifically indicated above     Objective:    BP 129/89   Pulse 92   Temp 98.6 F (37 C) (Temporal)   Resp 16   Ht 4\' 11"  (1.499 m)   Wt 154 lb 9.6 oz (70.1 kg)   BMI 31.23 kg/m   Wt Readings from Last 3  Encounters:  05/31/19 154 lb 9.6 oz (70.1 kg)  12/07/18 192 lb (87.1 kg)  12/01/18 193 lb 3.2 oz (87.6 kg)    Physical Exam Vitals and nursing note reviewed.  Constitutional:      General: She is not in acute distress.    Appearance: She is well-developed. She is not diaphoretic.     Comments: Well-appearing, comfortable, cooperative  HENT:     Head: Normocephalic and atraumatic.  Eyes:     General:        Right eye: No discharge.        Left eye: No discharge.     Conjunctiva/sclera: Conjunctivae normal.  Cardiovascular:     Rate and Rhythm: Normal rate.  Pulmonary:     Effort: Pulmonary effort is normal.  Skin:    General: Skin is warm and dry.     Findings: No erythema or rash.  Neurological:     Mental  Status: She is alert and oriented to person, place, and time.  Psychiatric:        Behavior: Behavior normal.     Comments: Well groomed, good eye contact, normal speech and thoughts    Results for orders placed or performed in visit on 05/31/19  POCT HgB A1C  Result Value Ref Range   Hemoglobin A1C 5.7 (A) 4.0 - 5.6 %      Assessment & Plan:   Problem List Items Addressed This Visit    Vasomotor flushing    Chronic problem W/ hot flashes and generalized sensitivity to heat On estrogen HRT and Clonidine On Lyrica cannot take gabapentin  Advised her to continue current meds, return to GYN to discuss alternative options or dose adjust  In Future may warrant consultation with Endocrinology      Relevant Medications   amLODipine (NORVASC) 10 MG tablet   losartan (COZAAR) 50 MG tablet   Insomnia    Still poorly controlled W/ [facility] in past Off Trazodone, ineffective, off Melatonin  Discussed may warrant hypnotic agent such as Dayvigo for low dose consistent nightly dosing, but will check medicaid coverage. She may benefit from trial on Ambien first if need PA      Essential hypertension    Improved HTN Controlled on current regimen. Losartan 50mg  daily, Propranolol ER 120mg  daily, Amlodipine 10mg       Relevant Medications   amLODipine (NORVASC) 10 MG tablet   losartan (COZAAR) 50 MG tablet   Esophageal reflux   Relevant Medications   omeprazole (PRILOSEC) 40 MG capsule   Controlled type 2 diabetes mellitus with complication, without long-term current use of insulin (HCC) - Primary    A1c down to 5.7 Without hyperglycemia Complications - other including hyperlipidemia specifically hypertriglyceridemia, GERD, depression, obesity, hypothyroidism, OSA - increases risk of future cardiovascular complications / poor glucose control due to reduced lifestyle diet/exercise with low energy mood and fatigue  Plan 1. Continue current therapy - Bydureon BCise 2mg  weekly [place]  injection - injection given in office today, she performed it on her own with guidance. 2. Encourage improved lifestyle - low carb, low sugar diet, reduce portion size, continue improving regular exercise Follow-up 6 months      Relevant Medications   BYDUREON BCISE 2 MG/0.85ML AUIJ   losartan (COZAAR) 50 MG tablet   Other Relevant Orders   POCT HgB A1C (Completed)   Centrilobular emphysema (HCC)    Chronic Centrilobular Emphysema COPD Seems poorly controlled Active smoker, goals to quit Suspect lack of improvement on Spiriva, may  contribute to her hot flashes Trial on Symbicort now 160 dose 1-2 puff BID, start 1 puff twice a day then increase to 2, sample given new rx sent to pharmacy, on medicaid PDL      Relevant Medications   SYMBICORT 160-4.5 MCG/ACT inhaler      Meds ordered this encounter  Medications  . BYDUREON BCISE 2 MG/0.85ML AUIJ    Sig: Inject 2 mg into the skin once a week.    Dispense:  4 pen    Refill:  3    Changed from Bydureon Pen to BCIse due to manufacturer  . omeprazole (PRILOSEC) 40 MG capsule    Sig: Take 1 capsule (40 mg total) by mouth 2 (two) times daily before a meal.    Dispense:  180 capsule    Refill:  1    90 day  . amLODipine (NORVASC) 10 MG tablet    Sig: Take 1 tablet (10 mg total) by mouth daily.    Dispense:  90 tablet    Refill:  1    Needs 90 day  . losartan (COZAAR) 50 MG tablet    Sig: Take 1 tablet (50 mg total) by mouth daily.    Dispense:  90 tablet    Refill:  1    Needs 90 day  . SYMBICORT 160-4.5 MCG/ACT inhaler    Sig: Inhale 2 puffs into the lungs in the morning and at bedtime.    Dispense:  1 Inhaler    Refill:  5     Follow up plan: Return in about 6 months (around 12/01/2019) for Annual Physical.  Future labs ordered for 11/2019   [name], [facility] 05/31/2019, 4:28 PM

## 2019-05-31 NOTE — Assessment & Plan Note (Signed)
A1c down to 5.7 Without hyperglycemia Complications - other including hyperlipidemia specifically hypertriglyceridemia, GERD, depression, obesity, hypothyroidism, OSA - increases risk of future cardiovascular complications / poor glucose control due to reduced lifestyle diet/exercise with low energy mood and fatigue  Plan 1. Continue current therapy - Bydureon BCise 2mg  weekly [place] injection - injection given in office today, she performed it on her own with guidance. 2. Encourage improved lifestyle - low carb, low sugar diet, reduce portion size, continue improving regular exercise Follow-up 6 months

## 2019-05-31 NOTE — Assessment & Plan Note (Signed)
Chronic problem W/ hot flashes and generalized sensitivity to heat On estrogen HRT and Clonidine On Lyrica cannot take gabapentin  Advised her to continue current meds, return to GYN to discuss alternative options or dose adjust  In Future may warrant consultation with Endocrinology

## 2019-05-31 NOTE — Assessment & Plan Note (Signed)
Still poorly controlled W/ [facility] in past Off Trazodone, ineffective, off Melatonin  Discussed may warrant hypnotic agent such as Dayvigo for low dose consistent nightly dosing, but will check medicaid coverage. She may benefit from trial on Ambien first if need PA

## 2019-06-01 ENCOUNTER — Telehealth: Payer: Self-pay | Admitting: Family Medicine

## 2019-06-01 DIAGNOSIS — F5104 Psychophysiologic insomnia: Secondary | ICD-10-CM

## 2019-06-01 NOTE — Telephone Encounter (Signed)
Please notify patient that the sleeping med Dayvigo (the new one we talked about is not covered by medicaid at this time).  For now, we will need to use the generic Ambien (Zolpidem) 5mg  nightly as needed.  She should use it on occasion. Not every night. Can use 30 min prior to bed. Or may take it if unable to sleep at night.  It is a controlled medicine, so we are limited on how many pills per month we can rx.  Caution sedation or grogginess next day. She should be careful driving and operating equipment next day if she takes it over night and does not have 8 hours to sleep.  Let me know if she wants to proceed and I can order Zolpidem.  [name], [facility] 06/01/2019, 6:27 PM

## 2019-06-02 MED ORDER — ZOLPIDEM TARTRATE 5 MG PO TABS: 5.0000 mg | ORAL_TABLET | Freq: Every evening | ORAL | 2 refills | Status: DC | PRN

## 2019-06-02 NOTE — Telephone Encounter (Signed)
Ordered Zolpidem #15 pills  [name], DO [facility] 06/02/2019, 1:41 PM

## 2019-06-02 NOTE — Telephone Encounter (Signed)
Patient advised as per Dr. Raliegh Ip wants Rx send.

## 2019-06-10 DIAGNOSIS — G4733 Obstructive sleep apnea (adult) (pediatric): Secondary | ICD-10-CM | POA: Diagnosis not present

## 2019-06-14 ENCOUNTER — Ambulatory Visit (INDEPENDENT_AMBULATORY_CARE_PROVIDER_SITE_OTHER): Payer: Medicaid Other | Admitting: Urology

## 2019-06-14 ENCOUNTER — Other Ambulatory Visit: Payer: Self-pay

## 2019-06-14 ENCOUNTER — Encounter: Payer: Self-pay | Admitting: Urology

## 2019-06-14 VITALS — BP 113/75 | HR 85 | Ht 59.0 in | Wt 160.0 lb

## 2019-06-14 DIAGNOSIS — N3946 Mixed incontinence: Secondary | ICD-10-CM | POA: Diagnosis not present

## 2019-06-14 NOTE — Progress Notes (Signed)
06/14/2019 2:40 PM   Darlene Barber Dec 11, 1965 [id number]  Referring provider: Olin Hauser, DO [address] [place],  [place] [address]  Chief Complaint  Patient presents with  . Follow-up    HPI: Patient was consulted for worsening incontinence over 1 year especially since November. She leaks with coughing sneezing standing and bending. She has urge incontinence that is quite sudden. I think she has small volume bedwetting.She wears 3 pads a day that can be quite wet.  She voids every 2 or 3 hours and is no nocturia. Her flow is good.   Mild grade 2 hypermobility of the bladder neck with a negative cough test with a mild cough. Grade 1 cystocele and no rectocele  The patient has mixed incontinence and mild bedwetting. Both components seem to be quite significant.  On urodynamics the patient was catheterized for 100 mL. Bladder capacity was 622 mL. The bladder was unstable reaching a pressure of 5 cm of waterfelt asurgency but she did not leak. She did note that she leaks when she goes from a sitting to standing position and that her urges will hit her suddenly. She did not leak with a Valsalva pressure of 122 cm of water. During voluntary voiding she voided 575 mL. Maximal flow was 8 mils per second. Maximum voiding pressure is 38 semis water. She emptied efficiently. She was straining some. EMG activity increased during the voiding phase. Bladder neck descent at 1 or 2 cm.   The patient has mild stress incontinence and clinically an overactive bladder with urge incontinence and mild bedwetting. I will try to help her with medical and behavioral therapy. She may be a candidate to consider a sling in the future and I would want to re-quantitate her stress component. Ongoing OAB symptoms and bedwetting would need to be discussePelvic floor exercises and fluid modifications also started.  She did not respond to Myrbetriq Patient leaks with  coughing but not that much unless she has a bad cough with bronchitis. She is trying to stop smoking.   Reassess in 2 months. Detrol LA and oxybutynin ER 10 mg given. Re-quantitate goals and the role of a sling versus refractory therapy.  Patient has minimal stress incontinence unless she has bronchitis. She still has urge incontinence with very little warning as her primary symptom. Because of insurance we did not talk about percutaneous tibial nerve stimulation. We talked about Botox and InterStim with usual templates. She would like to try Botox first to try to avoid surgery.  Patient did really well with first Botox and will only start taking 3 weeks ago.  Clinically not infected.  Urge incontinence present. Second Botox treatment September 2020  Day Frequency stable Currently no pads.  Rare urge incontinence.  No bedwetting.  Still leaks with bronchitis but does great.  Very pleased.  Clinically not infected     PMH: Past Medical History:  Diagnosis Date  . Allergy   . Anxiety   . Bursitis of both hips   . Depression   . Frequent headaches   . Obesity   . Sleep apnea    doesn't use CPAP machine broken,     Surgical History: Past Surgical History:  Procedure Laterality Date  . CESAREAN SECTION     x2  . CHOLECYSTECTOMY    . COLONOSCOPY WITH PROPOFOL N/A 11/20/2017   Procedure: COLONOSCOPY WITH PROPOFOL;  Surgeon: Lin Landsman, MD;  Location: [facility] ENDOSCOPY;  Service: Gastroenterology;  Laterality: N/A;  .  HYSTERECTOMY ABDOMINAL WITH SALPINGECTOMY  1998  . HYSTEROSCOPY      Home Medications:  Allergies as of 06/14/2019      Reactions   [place] Other (See Comments)   Black pepper Feels like throat is closing, itchy Feels like throat is closing, itchy Feels like throat is closing, itchy   Tape Rash   Paper tape is ok to use.      Medication List       Accurate as of June 14, 2019  2:40 PM. If you have any questions, ask your nurse or doctor.          albuterol 108 (90 Base) MCG/ACT inhaler Commonly known as: VENTOLIN HFA INHALE 2 PUFFS INTO LUNGS EVERY FOUR HOURS AS NEEDED FOR WHEEZING SHORTNESS OF BREATH   amLODipine 10 MG tablet Commonly known as: NORVASC Take 1 tablet (10 mg total) by mouth daily.   benztropine 0.5 MG tablet Commonly known as: COGENTIN Take 0.5 mg by mouth 2 (two) times daily.   Bydureon BCise 2 MG/0.85ML [place] Generic drug: Exenatide ER Inject 2 mg into the skin once a week.   clobetasol cream 0.05 % Commonly known as: TEMOVATE Frequency:BID   Dosage:0.0     Instructions:  Note:Dose: 0.05 %   cloNIDine 0.2 MG tablet Commonly known as: CATAPRES TAKE 1 TABLET (0.2 MG TOTAL) BY MOUTH DAILY.   diclofenac 75 MG EC tablet Commonly known as: VOLTAREN Take by mouth.   estradiol 1 MG tablet Commonly known as: ESTRACE Take 1 tablet (1 mg total) by mouth daily.   etodolac 400 MG tablet Commonly known as: LODINE Take 400 mg by mouth 2 (two) times daily.   fluticasone 50 MCG/ACT nasal spray Commonly known as: FLONASE SPRAY 1 SPRAY INTO EACH NOSTRIL ONCE DAILY.   losartan 50 MG tablet Commonly known as: COZAAR Take 1 tablet (50 mg total) by mouth daily.   lurasidone 80 MG Tabs tablet Commonly known as: LATUDA Take 80 mg by mouth.   Melatonin 5 MG Caps Take by mouth.   nortriptyline 25 MG capsule Commonly known as: PAMELOR Take 25 mg by mouth at bedtime.   omeprazole 40 MG capsule Commonly known as: PRILOSEC Take 1 capsule (40 mg total) by mouth 2 (two) times daily before a meal.   pregabalin 100 MG capsule Commonly known as: LYRICA Take by mouth.   promethazine 25 MG tablet Commonly known as: PHENERGAN TAKE 1 TABLET BY MOUTH EVERY SIX HOURS AS NEEDED FOR NAUSEA VOMITING   propranolol ER 120 MG 24 hr capsule Commonly known as: INDERAL LA TAKE 1 CAPSULE (120 MG TOTAL) BY MOUTH DAILY.   senna 8.6 MG Tabs tablet Commonly known as: SENOKOT Take 1 tablet by mouth daily.   Symbicort  160-4.5 MCG/ACT inhaler Generic drug: budesonide-formoterol Inhale 2 puffs into the lungs in the morning and at bedtime.   valACYclovir 1000 MG tablet Commonly known as: VALTREX TAKE 1 TABLET BY MOUTH DAILY   vitamin B-12 500 MCG tablet Commonly known as: CYANOCOBALAMIN Take 500 mcg by mouth daily.   zolpidem 5 MG tablet Commonly known as: AMBIEN Take 1 tablet (5 mg total) by mouth at bedtime as needed for sleep.       Allergies:  Allergies  Allergen Reactions  . [place] Other (See Comments)    Black pepper Feels like throat is closing, itchy Feels like throat is closing, itchy Feels like throat is closing, itchy  . Tape Rash    Paper tape is ok to use.  Family History: Family History  Problem Relation Age of Onset  . Heart failure Maternal Grandmother   . Alzheimer's disease Maternal Grandfather   . Emphysema Paternal Grandmother   . Hodgkin's lymphoma Mother   . Diabetes Maternal Aunt   . Diabetes Maternal Uncle   . Cancer Other   . Breast cancer Neg Hx     Social History:  reports that she has been smoking cigarettes. She has a 15.00 pack-year smoking history. She has never used smokeless tobacco. She reports current alcohol use. She reports current drug use. Drug: Marijuana.  ROS:                                        Physical Exam: BP 113/75   Pulse 85   Ht 4\' 11"  (1.499 m)   Wt 160 lb (72.6 kg)   BMI 32.32 kg/m   Constitutional:  Alert and oriented, No acute distress. HEENT: [place] AT, moist mucus membranes.  Trachea midline, no masses.   Laboratory Data: Lab Results  Component Value Date   WBC 9.2 11/24/2018   HGB 15.3 11/24/2018   HCT 43.5 11/24/2018   MCV 97.5 11/24/2018   PLT 316 11/24/2018    Lab Results  Component Value Date   CREATININE 0.74 11/24/2018    No results found for: PSA  No results found for: TESTOSTERONE  Lab Results  Component Value Date   HGBA1C 5.7 (A) 05/31/2019    Urinalysis      Component Value Date/Time   COLORURINE Yellow 04/20/2012 1725   APPEARANCEUR Cloudy (A) 11/30/2018 1055   LABSPEC 1.010 04/20/2012 1725   PHURINE 6.0 04/20/2012 1725   GLUCOSEU Negative 11/30/2018 1055   GLUCOSEU Negative 04/20/2012 1725   HGBUR Negative 04/20/2012 1725   BILIRUBINUR Negative 11/30/2018 1055   BILIRUBINUR Negative 04/20/2012 1725   KETONESUR Negative 04/20/2012 1725   PROTEINUR 1+ (A) 11/30/2018 1055   PROTEINUR Negative 04/20/2012 1725   NITRITE Negative 11/30/2018 1055   NITRITE Negative 04/20/2012 1725   LEUKOCYTESUR Negative 11/30/2018 1055   LEUKOCYTESUR Negative 04/20/2012 1725    Pertinent Imaging:   Assessment & Plan: Patient will call when her symptoms return.  We will get a urine culture and proceed accordingly  There are no diagnoses linked to this encounter.  No follow-ups on file.  Reece Packer, MD  [facility] [address], [place], [place] [address] [phone]

## 2019-06-21 ENCOUNTER — Ambulatory Visit: Payer: Medicaid Other | Admitting: Obstetrics & Gynecology

## 2019-06-22 ENCOUNTER — Other Ambulatory Visit: Payer: Self-pay | Admitting: Family Medicine

## 2019-06-22 DIAGNOSIS — J3089 Other allergic rhinitis: Secondary | ICD-10-CM

## 2019-06-22 DIAGNOSIS — J0110 Acute frontal sinusitis, unspecified: Secondary | ICD-10-CM

## 2019-06-25 ENCOUNTER — Telehealth: Payer: Self-pay | Admitting: Family Medicine

## 2019-06-25 DIAGNOSIS — F5104 Psychophysiologic insomnia: Secondary | ICD-10-CM

## 2019-06-25 NOTE — Telephone Encounter (Signed)
Pt called stating that the Ambien she has been prescribed is not helping her. Pt is requesting to have another medication, possibly Seroquel, sent in for her so that she can get some relief for sleeping. Please advise.     [address] [facility] [place], [place] [address]  [address] [place] [address]  Phone: [phone] Fax: [phone]  Not a 24 hour pharmacy; exact hours not known.

## 2019-06-25 NOTE — Telephone Encounter (Signed)
She will need to discuss with her Psychiatry at this point. She is on multiple medications that can affect mood and sleep. I would prefer she get their opinion on adding seroquel or other mood medication that can help sleep. I have very limited options left at this point if failed Ambien.  We could try Dayvigo but it is very similar to Ambien. It may be denied however through [place].  [name], [facility] 06/25/2019, 5:27 PM

## 2019-06-30 MED ORDER — ZOLPIDEM TARTRATE 5 MG PO TABS: 5.0000 mg | ORAL_TABLET | Freq: Every evening | ORAL | 2 refills | Status: DC | PRN

## 2019-06-30 NOTE — Telephone Encounter (Signed)
Will agree to trial higher dose Zolpidem Ambien 5 to 10 mg now can take up to  2 pills per dose if need. New rx sent. Anticipate this would not be used every night. I advised again that she should discuss with Psychiatry about any other medication that will work for sleep / mood such as seroquel.  New rx sent.  [name], [facility] 06/30/2019, 11:59 AM

## 2019-06-30 NOTE — Telephone Encounter (Signed)
Unable to reach patient yesterday so transferred to [facility] [place] team and someone from [facility] called back here to assist the patient regarding medication --her question is to increase Ambien can she take 2 tablet of 5 mg instead of one, informed patient that as per Dr.K she should contact Psychiatry.

## 2019-06-30 NOTE — Telephone Encounter (Signed)
Patient notified as per Dr.K

## 2019-07-06 ENCOUNTER — Ambulatory Visit (INDEPENDENT_AMBULATORY_CARE_PROVIDER_SITE_OTHER): Payer: Medicaid Other | Admitting: Obstetrics & Gynecology

## 2019-07-06 ENCOUNTER — Other Ambulatory Visit: Payer: Self-pay

## 2019-07-06 ENCOUNTER — Encounter: Payer: Self-pay | Admitting: Obstetrics & Gynecology

## 2019-07-06 VITALS — BP 120/70 | Ht 59.0 in | Wt 151.0 lb

## 2019-07-06 DIAGNOSIS — R232 Flushing: Secondary | ICD-10-CM

## 2019-07-06 MED ORDER — CLONIDINE HCL 0.2 MG PO TABS: 0.2000 mg | ORAL_TABLET | Freq: Every day | ORAL | 3 refills | Status: DC

## 2019-07-06 NOTE — Patient Instructions (Addendum)
Hot flashes Cont Estrogen 1mg  daily Add Clonidine 2 mg daily  Clonidine tablets What is this medicine? CLONIDINE (KLOE ni deen) is used to treat high blood pressure as well as hot flashes. This medicine may be used for other purposes; ask your health care provider or pharmacist if you have questions. COMMON BRAND NAME(S): Catapres What should I tell my health care provider before I take this medicine? They need to know if you have any of these conditions:  kidney disease  an unusual or allergic reaction to clonidine, other medicines, foods, dyes, or preservatives  pregnant or trying to get pregnant  breast-feeding How should I use this medicine? Take this medicine by mouth with a glass of water. Follow the directions on the prescription label. Take your doses at regular intervals. Do not take your medicine more often than directed. Do not suddenly stop taking this medicine. You must gradually reduce the dose or you may get a dangerous increase in blood pressure. Ask your doctor or health care professional for advice. Talk to your pediatrician regarding the use of this medicine in children. Special care may be needed. Overdosage: If you think you have taken too much of this medicine contact a poison control center or emergency room at once. NOTE: This medicine is only for you. Do not share this medicine with others. What if I miss a dose? If you miss a dose, take it as soon as you can. If it is almost time for your next dose, take only that dose. Do not take double or extra doses. What may interact with this medicine? Do not take this medicine with any of the following medications:  MAOIs like Carbex, Eldepryl, Marplan, Nardil, and Parnate This medicine may also interact with the following medications:  barbiturate medicines for inducing sleep or treating seizures like phenobarbital  certain medicines for blood pressure, heart disease, irregular heart beat  certain medicines for  depression, anxiety, or psychotic disturbances  prescription pain medicines This list may not describe all possible interactions. Give your health care provider a list of all the medicines, herbs, non-prescription drugs, or dietary supplements you use. Also tell them if you smoke, drink alcohol, or use illegal drugs. Some items may interact with your medicine. What should I watch for while using this medicine? Visit your doctor or health care professional for regular checks on your progress. Check your heart rate and blood pressure regularly while you are taking this medicine. Ask your doctor or health care professional what your heart rate should be and when you should contact him or her. You may get drowsy or dizzy. Do not drive, use machinery, or do anything that needs mental alertness until you know how this medicine affects you. To avoid dizzy or fainting spells, do not stand or sit up quickly, especially if you are an older person. Alcohol can make you more drowsy and dizzy. Avoid alcoholic drinks. Your mouth may get dry. Chewing sugarless gum or sucking hard candy, and drinking plenty of water will help. Do not treat yourself for coughs, colds, or pain while you are taking this medicine without asking your doctor or health care professional for advice. Some ingredients may increase your blood pressure. If you are going to have surgery tell your doctor or health care professional that you are taking this medicine. What side effects may I notice from receiving this medicine? Side effects that you should report to your doctor or health care professional as soon as possible:  allergic reactions  like skin rash, itching or hives, swelling of the face, lips, or tongue  anxiety, nervousness  chest pain  depression  fast, irregular heartbeat  swelling of feet or legs  unusually weak or tired Side effects that usually do not require medical attention (report to your doctor or health care  professional if they continue or are bothersome):  change in sex drive or performance  constipation  headache This list may not describe all possible side effects. Call your doctor for medical advice about side effects. You may report side effects to FDA at 1-800-FDA-1088. Where should I keep my medicine? Keep out of the reach of children. Store at room temperature between 15 and 30 degrees C (59 and 86 degrees F). Protect from light. Keep container tightly closed. Throw away any unused medicine after the expiration date. NOTE: This sheet is a summary. It may not cover all possible information. If you have questions about this medicine, talk to your doctor, pharmacist, or health care provider.  2020 Elsevier/Gold Standard (2010-08-22 13:01:28)

## 2019-07-06 NOTE — Progress Notes (Signed)
HPI:      [name] is a 54 y.o. [phone] who is postmenopausal, presents today for a problem visit.  She complains of hot flashes.   Symptoms have been present for several years. Symptoms are mod to severe and has led her to come in today to seek options for intervention.  Reports good libido; no vag dryness.    Previous Treatment: ERT 1 mg daily current and for many years.  Has been on higher doses in past  She is has sex with males. Denies PostMenopausal Bleeding.  Prior abdominal supracervical hysterectomy 1998.  PMHx: She  has a past medical history of Allergy, Anxiety, Bursitis of both hips, Depression, Frequent headaches, Obesity, and Sleep apnea. Also,  has a past surgical history that includes Cholecystectomy; Cesarean section; Hysteroscopy; Hysterectomy abdominal with salpingectomy (1998); and Colonoscopy with propofol (N/A, 11/20/2017)., family history includes Alzheimer's disease in her maternal grandfather; Cancer in an other family member; Diabetes in her maternal aunt and maternal uncle; Emphysema in her paternal grandmother; Heart failure in her maternal grandmother; Hodgkin's lymphoma in her mother.,  reports that she has been smoking cigarettes. She has a 15.00 pack-year smoking history. She has never used smokeless tobacco. She reports current alcohol use. She reports current drug use. Drug: Marijuana.  She has a current medication list which includes the following prescription(s): albuterol, amlodipine, benztropine, bydureon bcise, clobetasol cream, clonidine, estradiol, etodolac, fluticasone, losartan, lurasidone, melatonin, nortriptyline, omeprazole, pregabalin, promethazine, propranolol er, senna, symbicort, valacyclovir, vitamin b-12, and zolpidem. Also, is allergic to piper and tape.  Review of Systems  Constitutional: Negative for chills, fever and malaise/fatigue.  HENT: Negative for congestion, sinus pain and sore throat.   Eyes: Negative for blurred vision and  pain.  Respiratory: Negative for cough and wheezing.   Cardiovascular: Negative for chest pain and leg swelling.  Gastrointestinal: Negative for abdominal pain, constipation, diarrhea, heartburn, nausea and vomiting.  Genitourinary: Negative for dysuria, frequency, hematuria and urgency.  Musculoskeletal: Negative for back pain, joint pain, myalgias and neck pain.  Skin: Negative for itching and rash.  Neurological: Negative for dizziness, tremors and weakness.  Endo/Heme/Allergies: Does not bruise/bleed easily.  Psychiatric/Behavioral: Negative for depression. The patient is not nervous/anxious and does not have insomnia.   All other systems reviewed and are negative.   Objective: BP 120/70   Ht 4\' 11"  (1.499 m)   Wt 151 lb (68.5 kg)   BMI 30.50 kg/m  Physical Exam Constitutional:      General: She is not in acute distress.    Appearance: She is well-developed.  Musculoskeletal:        General: Normal range of motion.  Neurological:     Mental Status: She is alert and oriented to person, place, and time.  Skin:    General: Skin is warm and dry.  Vitals reviewed.     ASSESSMENT/PLAN:  Menopause.   ICD-10-CM   1. Vasomotor flushing  R23.2 cloNIDine (CATAPRES) 0.2 MG tablet  Options discussed.  Will cont current dose of ERT and add in Clonidine.  Records suggest we have done that strategy (without ERT) in past but pt denies or does not remember that.  She insists she is not on that medicine currently or recently.  Will give 2-3 mos and then see back and decide on adjustemtns to this plan (change ERT, change Clonidine, change doses, change to something else).  Discussed longterm ERT use and risks.  A total of 25 minutes were spent face-to-face with the patient  as well as preparation, review, communication, and documentation during this encounter.   Barnett Applebaum, MD, [facility], [facility] 07/06/2019  1:49 PM

## 2019-07-08 ENCOUNTER — Telehealth: Payer: Self-pay

## 2019-07-08 NOTE — Telephone Encounter (Signed)
Pt called triage needing RPH to increase a dose of a medication shes been on for a long time, I called her back to see which medication she is referring to , no answer lmtrc

## 2019-07-13 DIAGNOSIS — E119 Type 2 diabetes mellitus without complications: Secondary | ICD-10-CM | POA: Diagnosis not present

## 2019-07-13 LAB — HM DIABETES EYE EXAM

## 2019-07-14 ENCOUNTER — Telehealth: Payer: Self-pay

## 2019-07-14 NOTE — Telephone Encounter (Signed)
Pt requesting an increase on her dosage of Clonidine. Please advise.

## 2019-07-15 ENCOUNTER — Encounter: Payer: Self-pay | Admitting: Family Medicine

## 2019-07-15 NOTE — Telephone Encounter (Signed)
Left message to advise pt of RPH advice

## 2019-07-15 NOTE — Telephone Encounter (Signed)
Let her know too soon to expect improvements, should give this dose 6-8 weeks before changing

## 2019-07-20 ENCOUNTER — Other Ambulatory Visit: Payer: Self-pay | Admitting: Obstetrics & Gynecology

## 2019-07-20 MED ORDER — CLONIDINE HCL 0.3 MG PO TABS: 0.3000 mg | ORAL_TABLET | Freq: Two times a day (BID) | ORAL | 11 refills | Status: DC

## 2019-07-20 NOTE — Telephone Encounter (Signed)
Patient returning call, states she has actually been on this medication for a long time and didn't realize she was still taking it.  She is requesting to up the dose or change to another medication.  Please advise.

## 2019-07-23 ENCOUNTER — Other Ambulatory Visit: Payer: Self-pay | Admitting: Family Medicine

## 2019-07-23 NOTE — Telephone Encounter (Signed)
Requested medication (s) are due for refill today: yes  Requested medication (s) are on the active medication list: yes  Last refill:  02/18/19  Future visit scheduled: No  Notes to clinic:  Medication not delegated    Requested Prescriptions  Pending Prescriptions Disp Refills   promethazine (PHENERGAN) 25 MG tablet [Pharmacy Med Name: Promethazine HCl 25MG  TABS] 30 tablet 2    Sig: TAKE 1 TABLET BY MOUTH EVERY SIX HOURS AS NEEDED FOR NAUSEA VOMITING      Not Delegated - Gastroenterology: Antiemetics Failed - 07/23/2019  9:55 AM      Failed - This refill cannot be delegated      Passed - Valid encounter within last 6 months    Recent Outpatient Visits           1 month ago Controlled type 2 diabetes mellitus with complication, without long-term current use of insulin [facility])   [facility], Devonne Doughty, DO   7 months ago Annual physical exam   [facility] Olin Hauser, DO   11 months ago Tobacco abuse   [place], DO   1 year ago Essential hypertension   [place], DO   1 year ago Simple chronic bronchitis [facility])   [place], Devonne Doughty, DO

## 2019-07-26 ENCOUNTER — Telehealth: Payer: Self-pay

## 2019-07-26 DIAGNOSIS — I10 Essential (primary) hypertension: Secondary | ICD-10-CM

## 2019-07-26 NOTE — Telephone Encounter (Signed)
I did a search on her list and the only one I can see that may be a possible cause is Propranolol.  It could be switched to a different type of beta blocker that is less likely to cause hair loss if needed.  [name], [facility] 07/26/2019, 5:06 PM

## 2019-07-26 NOTE — Telephone Encounter (Signed)
Pt is going to call me back when she gets home to check the mg of her Catapres to see if RPH needed to send in a higher dose.[name] He sent in a rx on 5/11 of catapres 0.3mg  take 1 tablet two times daily,

## 2019-07-26 NOTE — Telephone Encounter (Signed)
Copied from [place] [phone]. Topic: General - Other >> Jul 26, 2019  3:40 PM Hinda Lenis D wrote: Reason for CRM: PT asking for the Dr to check if any of her medication side effects making her lose her hair / please advise

## 2019-07-27 NOTE — Telephone Encounter (Signed)
Patient notified she wants different beta blocker send to [facility].

## 2019-07-28 MED ORDER — ATENOLOL 50 MG PO TABS: 50.0000 mg | ORAL_TABLET | Freq: Every day | ORAL | 1 refills | Status: DC

## 2019-07-28 NOTE — Telephone Encounter (Signed)
Will DC Propranolol and switch to Atenolol 50mg  daily. New rx sent.  It can be less associated with hair loss. May need to completely DC beta blocker if still not optimal result.  [name], [facility] 07/28/2019, 12:49 PM

## 2019-08-06 ENCOUNTER — Other Ambulatory Visit: Payer: Self-pay | Admitting: Obstetrics & Gynecology

## 2019-08-06 ENCOUNTER — Telehealth: Payer: Self-pay

## 2019-08-06 MED ORDER — GABAPENTIN 100 MG PO CAPS: 100.0000 mg | ORAL_CAPSULE | Freq: Three times a day (TID) | ORAL | 2 refills | Status: DC

## 2019-08-06 NOTE — Telephone Encounter (Signed)
Let her know will change to Gabapentin which in low dose can help w hot flashes, and is not a hormone (continue what she is on for hormone use still).

## 2019-08-06 NOTE — Telephone Encounter (Addendum)
Patient reports she started getting dizzy the day after she started the clonidine. It got worse until 2 days ago she couldn't hardly walk from being weak/tired. She googled side effects and found it was used for BP and she figures it was too much w/the BP meds she's already taking. She's requesting something different for hot flashes. [phone]

## 2019-08-06 NOTE — Telephone Encounter (Signed)
Patient aware.

## 2019-08-23 ENCOUNTER — Other Ambulatory Visit: Payer: Self-pay | Admitting: Family Medicine

## 2019-08-23 DIAGNOSIS — J410 Simple chronic bronchitis: Secondary | ICD-10-CM

## 2019-08-23 DIAGNOSIS — I10 Essential (primary) hypertension: Secondary | ICD-10-CM

## 2019-08-23 NOTE — Telephone Encounter (Signed)
Requested medication (s) are due for refill today: Yes  Requested medication (s) are on the active medication list: Yes  Last refill:  Cozaar 05/31/19  Albuterol  05/26/19  Future visit scheduled: No  Notes to clinic:  Maximum refills reached.    Requested Prescriptions  Pending Prescriptions Disp Refills   losartan (COZAAR) 50 MG tablet [Pharmacy Med Name: Losartan Potassium 50MG  TABS] 30 tablet     Sig: Take 1 tablet (50 mg total) by mouth daily.      Cardiovascular:  Angiotensin Receptor Blockers Failed - 08/23/2019  9:13 AM      Failed - Cr in normal range and within 180 days    Creat  Date Value Ref Range Status  11/24/2018 0.74 0.50 - 1.05 mg/dL Final    Comment:    For patients >58 years of age, the reference limit for Creatinine is approximately 13% higher for people identified as African-American. .           Failed - K in normal range and within 180 days    Potassium  Date Value Ref Range Status  11/24/2018 4.4 3.5 - 5.3 mmol/L Final  04/20/2012 4.2 3.5 - 5.1 mmol/L Final          Passed - Patient is not pregnant      Passed - Last BP in normal range    BP Readings from Last 1 Encounters:  07/06/19 120/70          Passed - Valid encounter within last 6 months    Recent Outpatient Visits           2 months ago Controlled type 2 diabetes mellitus with complication, without long-term current use of insulin ([place])   [place], DO   8 months ago Annual physical exam   [place], DO   1 year ago Tobacco abuse   [facility] [place], Devonne Doughty, DO   1 year ago Essential hypertension   [place], DO   1 year ago Simple chronic bronchitis [facility])   [facility], Devonne Doughty, DO                albuterol (VENTOLIN HFA) 108 (90 Base) MCG/ACT inhaler [Pharmacy Med Name: Albuterol  Sulfate HFA 108 (90 Base)MCG/ACT AERS] 8.5 g 2    Sig: INHALE 2 PUFFS INTO LUNGS EVERY FOUR HOURS AS NEEDED FOR WHEEZING SHORTNESS OF BREATH      Pulmonology:  Beta Agonists Failed - 08/23/2019  9:13 AM      Failed - One inhaler should last at least one month. If the patient is requesting refills earlier, contact the patient to check for uncontrolled symptoms.      Passed - Valid encounter within last 12 months    Recent Outpatient Visits           2 months ago Controlled type 2 diabetes mellitus with complication, without long-term current use of insulin [facility])   [facility], Devonne Doughty, DO   8 months ago Annual physical exam   [facility] Olin Hauser, DO   1 year ago Tobacco abuse   [place], DO   1 year ago Essential hypertension   [place], DO   1 year ago Simple chronic bronchitis [facility])   [facility]  [place], Alexander J, DO                SPIRIVA HANDIHALER 18 MCG inhalation capsule [Pharmacy Med Name: Spiriva HandiHaler 18MCG CAPS] 30 capsule     Sig: PLACE 1 CAPSULE (18 MCG TOTAL) INTO INHALER AND INHALE DAILY.      Pulmonology:  Anticholinergic Agents Passed - 08/23/2019  9:13 AM      Passed - Valid encounter within last 12 months    Recent Outpatient Visits           2 months ago Controlled type 2 diabetes mellitus with complication, without long-term current use of insulin [facility])   [facility], Devonne Doughty, DO   8 months ago Annual physical exam   [facility] Olin Hauser, DO   1 year ago Tobacco abuse   [place], DO   1 year ago Essential hypertension   [place], DO   1 year ago Simple chronic bronchitis [facility])   [place],  Devonne Doughty, DO

## 2019-09-03 DIAGNOSIS — F603 Borderline personality disorder: Secondary | ICD-10-CM | POA: Diagnosis not present

## 2019-09-06 ENCOUNTER — Telehealth: Payer: Self-pay | Admitting: Urology

## 2019-09-06 NOTE — Telephone Encounter (Signed)
Called patient she states she was inquiring when she could get scheduled for her next Botox treatment. Patient was scheduled with PA for a visit for discussion

## 2019-09-06 NOTE — Telephone Encounter (Signed)
This pt. Left a voicemail for someone to call her and discuss/schedule BCG'S.

## 2019-09-16 ENCOUNTER — Ambulatory Visit: Payer: Self-pay | Admitting: Physician Assistant

## 2019-09-16 ENCOUNTER — Encounter: Payer: Self-pay | Admitting: Physician Assistant

## 2019-09-22 ENCOUNTER — Encounter: Payer: Self-pay | Admitting: Family Medicine

## 2019-09-22 ENCOUNTER — Other Ambulatory Visit: Payer: Self-pay

## 2019-09-22 ENCOUNTER — Ambulatory Visit (INDEPENDENT_AMBULATORY_CARE_PROVIDER_SITE_OTHER): Payer: Medicaid Other | Admitting: Family Medicine

## 2019-09-22 ENCOUNTER — Ambulatory Visit: Payer: Medicaid Other | Admitting: Family Medicine

## 2019-09-22 VITALS — BP 90/58 | HR 75 | Temp 98.3°F | Ht 59.0 in | Wt 134.0 lb

## 2019-09-22 DIAGNOSIS — R634 Abnormal weight loss: Secondary | ICD-10-CM

## 2019-09-22 DIAGNOSIS — E538 Deficiency of other specified B group vitamins: Secondary | ICD-10-CM

## 2019-09-22 DIAGNOSIS — R110 Nausea: Secondary | ICD-10-CM

## 2019-09-22 DIAGNOSIS — E559 Vitamin D deficiency, unspecified: Secondary | ICD-10-CM

## 2019-09-22 DIAGNOSIS — R42 Dizziness and giddiness: Secondary | ICD-10-CM

## 2019-09-22 DIAGNOSIS — E118 Type 2 diabetes mellitus with unspecified complications: Secondary | ICD-10-CM | POA: Diagnosis not present

## 2019-09-22 DIAGNOSIS — R1310 Dysphagia, unspecified: Secondary | ICD-10-CM

## 2019-09-22 DIAGNOSIS — N951 Menopausal and female climacteric states: Secondary | ICD-10-CM

## 2019-09-22 NOTE — Patient Instructions (Addendum)
Thank you for coming to the office today.     Last seen by Darcel Bayley PA (12/2018)    Gastroenterology  [address]  [place] [address]    Phone: [phone]  Call [facility] to schedule consultation for unintentional weight loss, poor appetite difficulty swallowing, chronic nausea.   1. You have symptoms of Vertigo (Benign Paroxysmal Positional Vertigo) - This is commonly caused by inner ear fluid imbalance, sometimes can be worsened by allergies and sinus symptoms, otherwise it can occur randomly sometimes and we may never discover the exact cause. - To treat this, try the Epley Manuever (see diagrams/instructions below) at home up to 3 times a day for 1-2 weeks or until symptoms resolve - You may take Meclizine / Dramamine - as needed up to 3 times a day for dizziness, this will not cure symptoms but may help. Caution may make you drowsy.  If you develop significant worsening episode with vertigo that does not improve and you get severe headache, loss of vision, arm or leg weakness, slurred speech, or other concerning symptoms please seek immediate medical attention at Emergency Department.  Please schedule a follow-up appointment with Dr Parks Ranger within 4 weeks if Vertigo not improving, and will consider Referral to Vestibular Rehab  See the next page for images describing the Epley Manuever.     ----------------------------------------------------------------------------------------------------------------------         DUE for NON FASTING BLOOD WORK   SCHEDULE "Lab Only" visit in the morning at the clinic for lab draw in 1-2 days  - Make sure Lab Only appointment is at about 1 week before your next appointment, so that results will be available  For Lab Results, once available within 2-3 days of blood draw, you can can log in to MyChart online to view your results and a brief explanation. Also, we can discuss results at next  follow-up visit.   Please schedule a Follow-up Appointment to: Return if symptoms worsen or fail to improve, for nausea, weight loss.  If you have any other questions or concerns, please feel free to call the office or send a message through [place]. You may also schedule an earlier appointment if necessary.  Additionally, you may be receiving a survey about your experience at our office within a few days to 1 week by e-mail or mail. We value your feedback.  [name], DO [place]

## 2019-09-22 NOTE — Progress Notes (Signed)
Subjective:    Patient ID: Darlene Barber, female    DOB: 1965-09-03, 54 y.o.   MRN: [id number]  Darlene Barber is a 54 y.o. female presenting on 09/22/2019 for Dizziness (nauseated. Pt seems to think the heat is making the dizziness worse x 3 weeks. Pt state that when she standing in place she feels like everything around her is moving. Difficulty with swallowing x 1 mth w/ lack of appetite. She lose 80lb in the last yr. )   HPI   Weight Loss Nausea DIzziness w vertigo  Weight down 10 lbs from 2019 to 2020 Significant weight loss from 1 year from 09/2018 to 09/2019 with approximately 65-70 lb weight loss, most dramatic loss was 40 lbs in 6 months from 11/2018 to 05/2019.  Chronic Nausea for years On Promethazine Rarely vomiting. Usually takes medicine to help prevent vomiting Worse if warm temperature GYN has treated her vasomotor hot flash symptoms with Gabapentin, with some relief. They said could add Clonidine if needed. Already on Lyrica still. On Gabapentin 100mg  TID.  GERD Omeprazole 40mg  BID  Has seen [facility] in 12/2018 for constipation. Has not returned.  She has difficulty swallowing with food feels like a "lump going down" only with solids. Not with liquids. No throat or swallowing pain.  Reports last 2-3 months with dramatic reduced appetite and PO intake. She was still losing weight, even when eating more normal.  CHRONIC DM, Type 2: On Bydureon BCise for past 2+ years. Last a1c controlled Meds:Bydureon BCise 2mg  weekly inj Reports good compliance. Tolerating well w/o side-effects Currently on ARB Weight loss some attributed to [employer] but says weight loss only significant worse in past 1 year, however on Bydureon for about 2-3 years Denies hypoglycemia, polyuria, visual changes, numbness or tingling.  Bipolar Mood Disorder / Insomnia last time 11/2018 added Trazodone > increased in 12/2018 up to 100mg  nightly in past took Ambien briefly, did  think it helped, does not know why it was changed. Off Trazodone, no longer helping Psych at [facility]  had insomnia, was taking Melatonin up to x 3 at night, was helping her fall asleep easier but difficulty waking up overnight. She was given Cogentin by Psychiatry to help sleep. - Off Trazodone - Now on Ambien 5mg  x 2 nightly PRN without relief.  Hot Flashes / Sensitivity to Heat Prior thyroid lab TSH normal. She describes chronic problem over >20+ years. Says she is in menopause and seeing GYN they have her on estrogen and clonidine, limited improvement, hasn't been back.   Depression screen [facility] 2/9 12/01/2018 08/21/2018 05/27/2018  Decreased Interest 0 0 0  Down, Depressed, Hopeless 1 0 0  PHQ - 2 Score 1 0 0  Altered sleeping 3 2 -  Tired, decreased energy 3 2 -  Change in appetite 1 3 -  Feeling bad or failure about yourself  0 0 -  Trouble concentrating 0 0 -  Moving slowly or fidgety/restless 0 0 -  Suicidal thoughts 0 0 -  PHQ-9 Score 8 7 -  Difficult doing work/chores Somewhat difficult Not difficult at all -    Social History   Tobacco Use  . Smoking status: Current Every Day Smoker    Packs/day: 1.00    Years: 30.00    Pack years: 30.00    Types: Cigarettes  . Smokeless tobacco: Never Used  Vaping Use  . Vaping Use: Never used  Substance Use Topics  . Alcohol use: Yes  Comment: occ once per year  . Drug use: Yes    Types: Marijuana    Comment: Current use    Review of Systems Per HPI unless specifically indicated above     Objective:    BP (!) 90/58 (BP Location: Left Arm, Patient Position: Sitting, Cuff Size: Normal)   Pulse 75   Temp 98.3 F (36.8 C) (Oral)   Ht 4\' 11"  (1.499 m)   Wt 134 lb (60.8 kg)   BMI 27.06 kg/m   Wt Readings from Last 3 Encounters:  09/22/19 134 lb (60.8 kg)  07/06/19 151 lb (68.5 kg)  06/14/19 160 lb (72.6 kg)    Physical Exam Vitals and nursing note reviewed.  Constitutional:      General: She is not in acute  distress.    Appearance: She is well-developed. She is not diaphoretic.     Comments: Well-appearing, comfortable, cooperative, with weight loss  HENT:     Head: Normocephalic and atraumatic.  Eyes:     General:        Right eye: No discharge.        Left eye: No discharge.     Conjunctiva/sclera: Conjunctivae normal.  Neck:     Thyroid: No thyromegaly.  Cardiovascular:     Rate and Rhythm: Normal rate and regular rhythm.     Heart sounds: Normal heart sounds. No murmur heard.   Pulmonary:     Effort: Pulmonary effort is normal. No respiratory distress.     Breath sounds: Normal breath sounds. No wheezing or rales.  Musculoskeletal:        General: Normal range of motion.     Cervical back: Normal range of motion and neck supple.  Lymphadenopathy:     Cervical: No cervical adenopathy.  Skin:    General: Skin is warm and dry.     Findings: No erythema or rash.  Neurological:     Mental Status: She is alert and oriented to person, place, and time.  Psychiatric:        Behavior: Behavior normal.     Comments: Well groomed, good eye contact, normal speech and thoughts      Results for orders placed or performed in visit on 07/15/19  HM DIABETES EYE EXAM  Result Value Ref Range   HM Diabetic Eye Exam No Retinopathy No Retinopathy      Assessment & Plan:   Problem List Items Addressed This Visit    Vitamin D deficiency   Relevant Orders   VITAMIN D 25 Hydroxy (Vit-D Deficiency, Fractures)   Post menopausal syndrome   Controlled type 2 diabetes mellitus with complication, without long-term current use of insulin ([place])    Other Visit Diagnoses    Unintentional weight loss    -  Primary   Relevant Orders   CBC with Differential/Platelet   COMPLETE METABOLIC PANEL WITH GFR   TSH   Chronic nausea       Vertigo       Dysphagia, unspecified type       Vitamin B12 nutritional deficiency       Relevant Orders   Vitamin B12   TSH      Chronic abnormal unintentional  weight loss over past 1 year approx Seems worse in past 6 months Note significant portion of weight loss likely due to overall poor PO intake and  appetite suppression on GLP1 with Bydureon BCise, however had been on med 2+ years prior to dramatic weight loss  Associated symptoms  with chronic nausea, has been present for years Now with dysphagia concerns.  At risk of nutritional vitamin Deficiency with poor PO  Secondary vertigo dizziness Handout on Epley maneuver, trial OTC dizziness medication  Specific instructions for her to f/u with [facility] - existing patient.  Review return criteria when to seek care more immediately.  Check lab panel when return to clinic within 1 week.  No orders of the defined types were placed in this encounter.   Orders Placed This Encounter  Procedures  . CBC with Differential/Platelet    Standing Status:   Future    Standing Expiration Date:   02/09/2020  . COMPLETE METABOLIC PANEL WITH GFR    Standing Status:   Future    Standing Expiration Date:   02/09/2020  . Vitamin B12    Standing Status:   Future    Standing Expiration Date:   02/09/2020  . VITAMIN D 25 Hydroxy (Vit-D Deficiency, Fractures)    Standing Status:   Future    Standing Expiration Date:   02/09/2020  . TSH    Standing Status:   Future    Standing Expiration Date:   02/09/2020     Follow up plan: Return if symptoms worsen or fail to improve, for nausea, weight loss.   [name], [facility] 09/22/2019, 2:24 PM

## 2019-09-28 ENCOUNTER — Ambulatory Visit: Payer: Self-pay | Admitting: Physician Assistant

## 2019-09-29 ENCOUNTER — Other Ambulatory Visit: Payer: Self-pay

## 2019-09-29 DIAGNOSIS — I10 Essential (primary) hypertension: Secondary | ICD-10-CM

## 2019-09-29 DIAGNOSIS — R634 Abnormal weight loss: Secondary | ICD-10-CM

## 2019-09-29 DIAGNOSIS — E559 Vitamin D deficiency, unspecified: Secondary | ICD-10-CM

## 2019-09-29 DIAGNOSIS — F319 Bipolar disorder, unspecified: Secondary | ICD-10-CM

## 2019-09-29 DIAGNOSIS — E538 Deficiency of other specified B group vitamins: Secondary | ICD-10-CM

## 2019-09-29 DIAGNOSIS — E1169 Type 2 diabetes mellitus with other specified complication: Secondary | ICD-10-CM

## 2019-09-29 DIAGNOSIS — Z0000 Encounter for general adult medical examination without abnormal findings: Secondary | ICD-10-CM

## 2019-09-29 DIAGNOSIS — E118 Type 2 diabetes mellitus with unspecified complications: Secondary | ICD-10-CM

## 2019-09-30 DIAGNOSIS — F319 Bipolar disorder, unspecified: Secondary | ICD-10-CM | POA: Diagnosis not present

## 2019-09-30 DIAGNOSIS — M79602 Pain in left arm: Secondary | ICD-10-CM | POA: Diagnosis not present

## 2019-09-30 DIAGNOSIS — R200 Anesthesia of skin: Secondary | ICD-10-CM | POA: Diagnosis not present

## 2019-09-30 DIAGNOSIS — Z0000 Encounter for general adult medical examination without abnormal findings: Secondary | ICD-10-CM | POA: Diagnosis not present

## 2019-09-30 DIAGNOSIS — I10 Essential (primary) hypertension: Secondary | ICD-10-CM | POA: Diagnosis not present

## 2019-09-30 DIAGNOSIS — M542 Cervicalgia: Secondary | ICD-10-CM | POA: Diagnosis not present

## 2019-09-30 DIAGNOSIS — E538 Deficiency of other specified B group vitamins: Secondary | ICD-10-CM | POA: Diagnosis not present

## 2019-09-30 DIAGNOSIS — E118 Type 2 diabetes mellitus with unspecified complications: Secondary | ICD-10-CM | POA: Diagnosis not present

## 2019-09-30 DIAGNOSIS — E1169 Type 2 diabetes mellitus with other specified complication: Secondary | ICD-10-CM | POA: Diagnosis not present

## 2019-09-30 DIAGNOSIS — E785 Hyperlipidemia, unspecified: Secondary | ICD-10-CM | POA: Diagnosis not present

## 2019-09-30 DIAGNOSIS — R519 Headache, unspecified: Secondary | ICD-10-CM | POA: Diagnosis not present

## 2019-09-30 DIAGNOSIS — E559 Vitamin D deficiency, unspecified: Secondary | ICD-10-CM | POA: Diagnosis not present

## 2019-09-30 LAB — COMPLETE METABOLIC PANEL WITH GFR
AG Ratio: 1.6 (calc) (ref 1.0–2.5)
ALT: 14 U/L (ref 6–29)
AST: 14 U/L (ref 10–35)
Albumin: 3.9 g/dL (ref 3.6–5.1)
Alkaline phosphatase (APISO): 70 U/L (ref 37–153)
BUN: 11 mg/dL (ref 7–25)
CO2: 23 mmol/L (ref 20–32)
Calcium: 9.5 mg/dL (ref 8.6–10.4)
Chloride: 109 mmol/L (ref 98–110)
Creat: 0.83 mg/dL (ref 0.50–1.05)
GFR, Est African American: 93 mL/min/{1.73_m2} (ref >=60)
GFR, Est Non African American: 81 mL/min/{1.73_m2} (ref >=60)
Globulin: 2.4 g/dL (calc) (ref 1.9–3.7)
Glucose, Bld: 139 mg/dL — ABNORMAL HIGH (ref 65–99)
Potassium: 4.2 mmol/L (ref 3.5–5.3)
Sodium: 140 mmol/L (ref 135–146)
Total Bilirubin: 0.6 mg/dL (ref 0.2–1.2)
Total Protein: 6.3 g/dL (ref 6.1–8.1)

## 2019-09-30 LAB — CBC WITH DIFFERENTIAL/PLATELET
Absolute Monocytes: 383 cells/uL (ref 200–950)
Basophils Absolute: 58 cells/uL (ref 0–200)
Basophils Relative: 1 %
Eosinophils Absolute: 203 cells/uL (ref 15–500)
Eosinophils Relative: 3.5 %
HCT: 43.8 % (ref 35.0–45.0)
Hemoglobin: 14.7 g/dL (ref 11.7–15.5)
Lymphs Abs: 2001 cells/uL (ref 850–3900)
MCH: 33.4 pg — ABNORMAL HIGH (ref 27.0–33.0)
MCHC: 33.6 g/dL (ref 32.0–36.0)
MCV: 99.5 fL (ref 80.0–100.0)
MPV: 12.2 fL (ref 7.5–12.5)
Monocytes Relative: 6.6 %
Neutro Abs: 3155 cells/uL (ref 1500–7800)
Neutrophils Relative %: 54.4 %
Platelets: 245 10*3/uL (ref 140–400)
RBC: 4.4 10*6/uL (ref 3.80–5.10)
RDW: 14 % (ref 11.0–15.0)
Total Lymphocyte: 34.5 %
WBC: 5.8 10*3/uL (ref 3.8–10.8)

## 2019-09-30 LAB — VITAMIN D 25 HYDROXY (VIT D DEFICIENCY, FRACTURES): Vit D, 25-Hydroxy: 23 ng/mL — ABNORMAL LOW (ref 30–100)

## 2019-09-30 LAB — TSH: TSH: 0.93 mIU/L

## 2019-09-30 LAB — VITAMIN B12: Vitamin B-12: 408 pg/mL (ref 200–1100)

## 2019-10-07 ENCOUNTER — Telehealth: Payer: Self-pay

## 2019-10-07 DIAGNOSIS — L659 Nonscarring hair loss, unspecified: Secondary | ICD-10-CM

## 2019-10-07 NOTE — Telephone Encounter (Signed)
Copied from [place] [id number]. Topic: General - Inquiry >> Oct 07, 2019  4:26 PM Gillis Ends D wrote: Reason for CRM: Patient states she is losing her hair and she wants the doctor to check her medications and see if any of her meds are causing her to lose her hair. Please advise

## 2019-10-07 NOTE — Telephone Encounter (Signed)
She has actually called [place] with this same complaint on 07/26/19 already.  I did a medicine search for her.  Her Propranolol medicine to prevent migraines was a possible one that could cause hair loss.  We stopped that one and started her on Atenolol instead.  I advised her at that time, that ANY beta blocker could still cause hair loss. So, it may not resolve.  She can follow-up in future we can do virtual appointment if needed still to discuss and answer her questions. Or if preferred we can refer to Dermatologist to discuss hair loss.  Darlene Barber, [facility] 10/07/2019, 6:27 PM

## 2019-10-08 NOTE — Telephone Encounter (Signed)
Patient informed as per Dr Raliegh Ip would like to proceed with dermatology referral.

## 2019-10-19 ENCOUNTER — Ambulatory Visit: Payer: Self-pay | Admitting: Physician Assistant

## 2019-10-19 ENCOUNTER — Encounter: Payer: Self-pay | Admitting: Physician Assistant

## 2019-10-20 DIAGNOSIS — G4733 Obstructive sleep apnea (adult) (pediatric): Secondary | ICD-10-CM | POA: Diagnosis not present

## 2019-10-26 ENCOUNTER — Other Ambulatory Visit: Payer: Self-pay

## 2019-10-26 ENCOUNTER — Ambulatory Visit (INDEPENDENT_AMBULATORY_CARE_PROVIDER_SITE_OTHER): Payer: Medicaid Other | Admitting: Family Medicine

## 2019-10-26 ENCOUNTER — Encounter: Payer: Self-pay | Admitting: Family Medicine

## 2019-10-26 VITALS — BP 70/50 | HR 87 | Temp 97.1°F | Resp 16 | Ht 59.0 in | Wt 131.0 lb

## 2019-10-26 DIAGNOSIS — I952 Hypotension due to drugs: Secondary | ICD-10-CM | POA: Diagnosis not present

## 2019-10-26 NOTE — Patient Instructions (Addendum)
Thank you for coming to the office today.  CALL [facility]  DISCONTINUE the blood pressure medications  AM Dose - Atenolol 50mg  daily (white round tablet) - Losartan 50mg  daily (green oblong tablet)  LUNCH - Amlodipine 10mg  (white round tablet)  CHECK BP if able at least once or twice in next few days.  Symptoms should majorly improve - reduced dizziness  -------------------------  Stay tuned for derm appointment for hair loss  I think the atenol is contributing  [facility] Dermatologist in [place], [place] Address: [address] [place], [place] [address] Phone: [phone]   Please schedule a Follow-up Appointment to: Return in about 1 week (around 11/02/2019), or if symptoms worsen or fail to improve, for Hypotension.  If you have any other questions or concerns, please feel free to call the office or send a message through [place]. You may also schedule an earlier appointment if necessary.  Additionally, you may be receiving a survey about your experience at our office within a few days to 1 week by e-mail or mail. We value your feedback.  [name], DO [place]

## 2019-10-26 NOTE — Progress Notes (Signed)
Subjective:    Patient ID: Darlene Barber, female    DOB: 1966/01/11, 54 y.o.   MRN: [id number]  Darlene Barber is a 54 y.o. female presenting on 10/26/2019 for disoriented and Dizziness (improved)  Accompanied by significant other, [place].  HPI   Hypotension due to medication Dizziness Episodes / Vertigo Last seen by me 09/22/19 with weight loss significantly in past 1 year over 60-70 lbs, see prior chart for details, for vertigo she was given epley maneuver at home, and for GI symptoms she was advised to return to [place] as existing patient. - She has still had persistent low BP due to weight loss and now having worse lightheadedness episodes, feel like going to pass out with standing and walking. She has had some improved dizziness after home exercises epley maneuver - Now here today with worsening symptoms of lightheaded. Not able to check BP at this time at home, was not aware of low BP She uses [place] pill pack, has today with her, she is still taking Atenolol 50mg  daily, Losartan 50mg  daily, Amlodipine 10mg  daily. The atenolol was for migraine prevention she was on propranolol, but having hair loss, she was referred already to [facility] for hair loss, asking about this again today.  Admits loss of balance, has some difficulty with blurry vision if lightheaded Admits episode of confusion Denies actual syncope episode, chest pain dyspnea  Depression screen [facility] 2/9 12/01/2018 08/21/2018 05/27/2018  Decreased Interest 0 0 0  Down, Depressed, Hopeless 1 0 0  PHQ - 2 Score 1 0 0  Altered sleeping 3 2 -  Tired, decreased energy 3 2 -  Change in appetite 1 3 -  Feeling bad or failure about yourself  0 0 -  Trouble concentrating 0 0 -  Moving slowly or fidgety/restless 0 0 -  Suicidal thoughts 0 0 -  PHQ-9 Score 8 7 -  Difficult doing work/chores Somewhat difficult Not difficult at all -    Social History   Tobacco Use  . Smoking status: Current Every Day  Smoker    Packs/day: 1.00    Years: 30.00    Pack years: 30.00    Types: Cigarettes  . Smokeless tobacco: Never Used  Vaping Use  . Vaping Use: Never used  Substance Use Topics  . Alcohol use: Yes    Comment: occ once per year  . Drug use: Yes    Types: Marijuana    Comment: Current use    Review of Systems Per HPI unless specifically indicated above     Objective:    BP (!) 70/50   Pulse 87   Temp (!) 97.1 F (36.2 C) (Temporal)   Resp 16   Ht 4\' 11"  (1.499 m)   Wt 131 lb (59.4 kg)   SpO2 98%   BMI 26.46 kg/m   Wt Readings from Last 3 Encounters:  10/26/19 131 lb (59.4 kg)  09/22/19 134 lb (60.8 kg)  07/06/19 151 lb (68.5 kg)    Physical Exam Vitals and nursing note reviewed.  Constitutional:      General: She is not in acute distress.    Appearance: She is well-developed. She is not diaphoretic.     Comments: Currently fairly comfortable while seated, appears off uncomfortable with lightheaded upon standing, cooperative  HENT:     Head: Normocephalic and atraumatic.  Eyes:     General:        Right eye: No discharge.  Left eye: No discharge.     Conjunctiva/sclera: Conjunctivae normal.  Neck:     Thyroid: No thyromegaly.  Cardiovascular:     Rate and Rhythm: Normal rate and regular rhythm.     Heart sounds: Normal heart sounds. No murmur heard.   Pulmonary:     Effort: Pulmonary effort is normal. No respiratory distress.     Breath sounds: Normal breath sounds. No wheezing or rales.  Musculoskeletal:        General: Normal range of motion.     Cervical back: Normal range of motion and neck supple.  Lymphadenopathy:     Cervical: No cervical adenopathy.  Skin:    General: Skin is warm and dry.     Findings: No erythema or rash.  Neurological:     General: No focal deficit present.     Mental Status: She is alert and oriented to person, place, and time. Mental status is at baseline.     Cranial Nerves: No cranial nerve deficit.    Psychiatric:        Behavior: Behavior normal.     Comments: Well groomed, good eye contact, normal speech and thoughts       Results for orders placed or performed in visit on 09/29/19  COMPLETE METABOLIC PANEL WITH GFR  Result Value Ref Range   Glucose, Bld 139 (H) 65 - 99 mg/dL   BUN 11 7 - 25 mg/dL   Creat 0.83 0.50 - 1.05 mg/dL   GFR, Est Non African American 81 > OR = 60 mL/min/1.58m2   GFR, Est African American 93 > OR = 60 mL/min/1.73m2   BUN/Creatinine Ratio NOT APPLICABLE 6 - 22 (calc)   Sodium 140 135 - 146 mmol/L   Potassium 4.2 3.5 - 5.3 mmol/L   Chloride 109 98 - 110 mmol/L   CO2 23 20 - 32 mmol/L   Calcium 9.5 8.6 - 10.4 mg/dL   Total Protein 6.3 6.1 - 8.1 g/dL   Albumin 3.9 3.6 - 5.1 g/dL   Globulin 2.4 1.9 - 3.7 g/dL (calc)   AG Ratio 1.6 1.0 - 2.5 (calc)   Total Bilirubin 0.6 0.2 - 1.2 mg/dL   Alkaline phosphatase (APISO) 70 37 - 153 U/L   AST 14 10 - 35 U/L   ALT 14 6 - 29 U/L  CBC with Differential/Platelet  Result Value Ref Range   WBC 5.8 3.8 - 10.8 Thousand/uL   RBC 4.40 3.80 - 5.10 Million/uL   Hemoglobin 14.7 11.7 - 15.5 g/dL   HCT 43.8 35 - 45 %   MCV 99.5 80.0 - 100.0 fL   MCH 33.4 (H) 27.0 - 33.0 pg   MCHC 33.6 32.0 - 36.0 g/dL   RDW 14.0 11.0 - 15.0 %   Platelets 245 140 - 400 Thousand/uL   MPV 12.2 7.5 - 12.5 fL   Neutro Abs 3,155 1,500 - 7,800 cells/uL   Lymphs Abs 2,001 850 - 3,900 cells/uL   Absolute Monocytes 383 200 - 950 cells/uL   Eosinophils Absolute 203 15 - 500 cells/uL   Basophils Absolute 58 0 - 200 cells/uL   Neutrophils Relative % 54.4 %   Total Lymphocyte 34.5 %   Monocytes Relative 6.6 %   Eosinophils Relative 3.5 %   Basophils Relative 1.0 %  VITAMIN D 25 Hydroxy (Vit-D Deficiency, Fractures)  Result Value Ref Range   Vit D, 25-Hydroxy 23 (L) 30 - 100 ng/mL  TSH  Result Value Ref Range   TSH 0.93  mIU/L  Vitamin B12  Result Value Ref Range   Vitamin B-12 408 200 - 1,100 pg/mL      Assessment & Plan:    Problem List Items Addressed This Visit    None    Visit Diagnoses    Hypotension due to drugs    -  Primary     Severe hypotension in range 70/50 range on manual repeat, with symptomatic hypotension likely due to blood pressure medications now, and some reduced nutrition/hydration lately with dramatic weight loss over past 1 year.  She is able to tolerate PO, and we discuss nutrition today She may return to [facility] for further GI consultation on her weight loss / nausea  Likely her HTN may be resolved or mostly controlled with her weight loss over past 6-12 months, today we will DISCONTINUE her HTN medications and may gradually add back something at lower dose if needed still.  She will borrow BP cuff from family member. She has caregiver support now to help her until she feels better.  Looked at her pill box in detail and we identified the exact pills she needs to discontinue from pillbox for tomorrow. She will call [place] discontinue her BP medications  DISCONTINUE CURRENT MEDS in pillbox AM Dose - Atenolol 50mg  daily (white round tablet - gave her details on which of the two white round it is) - Losartan 50mg  daily (green oblong tablet)  LUNCH - Amlodipine 10mg  (white round tablet)  She should monitor BP closely. Improve hydration. Caution with activity next 24-48 hours.  Advised one option if severe symptoms or if pass out, is to go to hospital, but they are not interested to go there at this time. They would be able to monitor and provide IVF or medication more acutely, but she prefers to stop the BP medications. I agree that if not taken tomorrow, should help normalize her BP.  Follow-up closely if not improving 1-2 weeks or sooner, call with BP readings.  #Dermatology will call her for apt for hair loss, but now we are stopping Atenolol, may help her hair loss.   No orders of the defined types were placed in this encounter.     Follow up plan: Return in about  1 week (around 11/02/2019), or if symptoms worsen or fail to improve, for Hypotension.   [name], [facility] 10/26/2019, 1:55 PM

## 2019-10-27 ENCOUNTER — Other Ambulatory Visit: Payer: Self-pay | Admitting: Obstetrics & Gynecology

## 2019-10-27 ENCOUNTER — Other Ambulatory Visit: Payer: Self-pay | Admitting: Family Medicine

## 2019-10-27 DIAGNOSIS — R232 Flushing: Secondary | ICD-10-CM

## 2019-10-27 DIAGNOSIS — J0110 Acute frontal sinusitis, unspecified: Secondary | ICD-10-CM

## 2019-10-27 DIAGNOSIS — J3089 Other allergic rhinitis: Secondary | ICD-10-CM

## 2019-10-27 NOTE — Telephone Encounter (Signed)
Requested Prescriptions  Pending Prescriptions Disp Refills  . fluticasone (FLONASE) 50 MCG/ACT nasal spray [Pharmacy Med Name: Fluticasone Propionate 50MCG/ACT SUSP] 16 g 3    Sig: SPRAY 1 SPRAY INTO EACH NOSTRIL ONCE DAILY.     Ear, Nose, and Throat: Nasal Preparations - Corticosteroids Passed - 10/27/2019  8:36 AM      Passed - Valid encounter within last 12 months    Recent Outpatient Visits          Yesterday Hypotension due to drugs   [place], DO   1 month ago Unintentional weight loss   [place], DO   4 months ago Controlled type 2 diabetes mellitus with complication, without long-term current use of insulin [facility])   [facility] Olin Hauser, DO   11 months ago Annual physical exam   [facility] Olin Hauser, DO   1 year ago Tobacco abuse   [facility], Devonne Doughty, DO             . valACYclovir ([place]) 1000 MG tablet [Pharmacy Med Name: valACYclovir HCl 1GM TABS] 30 tablet 3    Sig: TAKE 1 TABLET BY MOUTH DAILY     Antimicrobials:  Antiviral Agents - Anti-Herpetic Passed - 10/27/2019  8:36 AM      Passed - Valid encounter within last 12 months    Recent Outpatient Visits          Yesterday Hypotension due to drugs   [place], DO   1 month ago Unintentional weight loss   [place], DO   4 months ago Controlled type 2 diabetes mellitus with complication, without long-term current use of insulin [facility])   [facility] Olin Hauser, DO   11 months ago Annual physical exam   [facility] Olin Hauser, DO   1 year ago Tobacco abuse   [place], [place]

## 2019-11-01 ENCOUNTER — Other Ambulatory Visit: Payer: Self-pay | Admitting: Family Medicine

## 2019-11-01 DIAGNOSIS — E118 Type 2 diabetes mellitus with unspecified complications: Secondary | ICD-10-CM

## 2019-11-08 ENCOUNTER — Encounter: Payer: Self-pay | Admitting: Urology

## 2019-11-08 ENCOUNTER — Ambulatory Visit: Payer: Medicaid Other | Admitting: Urology

## 2019-11-12 ENCOUNTER — Telehealth: Payer: Self-pay

## 2019-11-12 DIAGNOSIS — F319 Bipolar disorder, unspecified: Secondary | ICD-10-CM

## 2019-11-12 NOTE — Telephone Encounter (Signed)
Please let the patient know the following:  Cats are not able to be recognized as "service animals"  However, cats can qualify as an "emotional support animal".  She should check with her Psychiatrist / Therapist office to ask if they are able to write her the [place].  It depends on what she needs the letter for - if you can find out more information that would be helpful.  If she still needs help, then she can contact us back and I can look into this further.  [name], [facility] 11/12/2019, 6:24 PM

## 2019-11-12 NOTE — Telephone Encounter (Signed)
Copied from [place] [id number]. Topic: General - Other >> Nov 12, 2019  2:56 PM Hinda Lenis D wrote: PT asking if her cat will be consider a service animal, she sasking for a letter form Dr Raliegh Ip Derrek [place] advise

## 2019-11-16 NOTE — Telephone Encounter (Signed)
Spoke to the patient the Psychiatrist/Therapist office is hard to get any appointment or even phone messages and they only do Zoom call, she is requesting this letter from Dr Raliegh Ip since she recently moved the apartment and they won't allow any animal unless has written as emotional support animal.

## 2019-11-18 NOTE — Telephone Encounter (Signed)
Patient informed. 

## 2019-11-18 NOTE — Telephone Encounter (Signed)
Letter for emotional support animal has been written, signed  Please notify patient it is ready for pick up.  [name], [facility] 11/18/2019, 2:52 PM

## 2019-11-22 ENCOUNTER — Ambulatory Visit: Payer: Medicaid Other | Admitting: Urology

## 2019-11-25 ENCOUNTER — Other Ambulatory Visit: Payer: Self-pay | Admitting: Family Medicine

## 2019-11-25 DIAGNOSIS — J432 Centrilobular emphysema: Secondary | ICD-10-CM

## 2019-11-25 DIAGNOSIS — K219 Gastro-esophageal reflux disease without esophagitis: Secondary | ICD-10-CM

## 2019-11-25 DIAGNOSIS — J410 Simple chronic bronchitis: Secondary | ICD-10-CM

## 2019-11-25 NOTE — Telephone Encounter (Signed)
Requested Prescriptions  Pending Prescriptions Disp Refills  . albuterol (PROAIR HFA) 108 (90 Base) MCG/ACT inhaler 8.5 g 0    Sig: INHALE 2 PUFFS INTO LUNGS EVERY FOUR HOURS AS NEEDED FOR WHEEZING SHORTNESS OF BREATH     Pulmonology:  Beta Agonists Failed - 11/25/2019  1:48 PM      Failed - One inhaler should last at least one month. If the patient is requesting refills earlier, contact the patient to check for uncontrolled symptoms.      Passed - Valid encounter within last 12 months    Recent Outpatient Visits          1 month ago Hypotension due to drugs   [place], DO   2 months ago Unintentional weight loss   [place], DO   5 months ago Controlled type 2 diabetes mellitus with complication, without long-term current use of insulin [facility])   [facility] Olin Hauser, DO   11 months ago Annual physical exam   [facility] Olin Hauser, DO   1 year ago Tobacco abuse   [place], DO      Future Appointments            In 4 days MacDiarmid, Nicki Reaper, MD [place]           . omeprazole (PRILOSEC) 40 MG capsule [Pharmacy Med Name: Omeprazole 40MG  CPDR] 180 capsule 0    Sig: TAKE 1 CAPSULE BY MOUTH TWICE A DAY 30 MINUTES BEFORE BREAKAFST AND DINNER     Gastroenterology: Proton Pump Inhibitors Passed - 11/25/2019  1:48 PM      Passed - Valid encounter within last 12 months    Recent Outpatient Visits          1 month ago Hypotension due to drugs   [place], DO   2 months ago Unintentional weight loss   [place], DO   5 months ago Controlled type 2 diabetes mellitus with complication, without long-term current use of insulin [facility])   [facility] Olin Hauser, DO    11 months ago Annual physical exam   [facility] Olin Hauser, DO   1 year ago Tobacco abuse   [place], Devonne Doughty, DO      Future Appointments            In 4 days [place], Nicki Reaper, [facility]

## 2019-11-29 ENCOUNTER — Ambulatory Visit: Payer: Medicaid Other | Admitting: Urology

## 2019-11-29 ENCOUNTER — Encounter: Payer: Self-pay | Admitting: Urology

## 2019-11-30 ENCOUNTER — Ambulatory Visit: Payer: Medicaid Other | Admitting: Family Medicine

## 2019-12-03 IMAGING — CR DG CHEST 2V
1 series · 2 of 2 positions shown · non-contrast
Comparison: 04/13/2008

CLINICAL DATA: Shortness of breath and cough for over 1 year.
Asthma.

EXAM:
CHEST - 2 VIEW

[Series 1: w chest pa · 0.14mm/px · 2 of 2 slices shown]
[im 1/2]
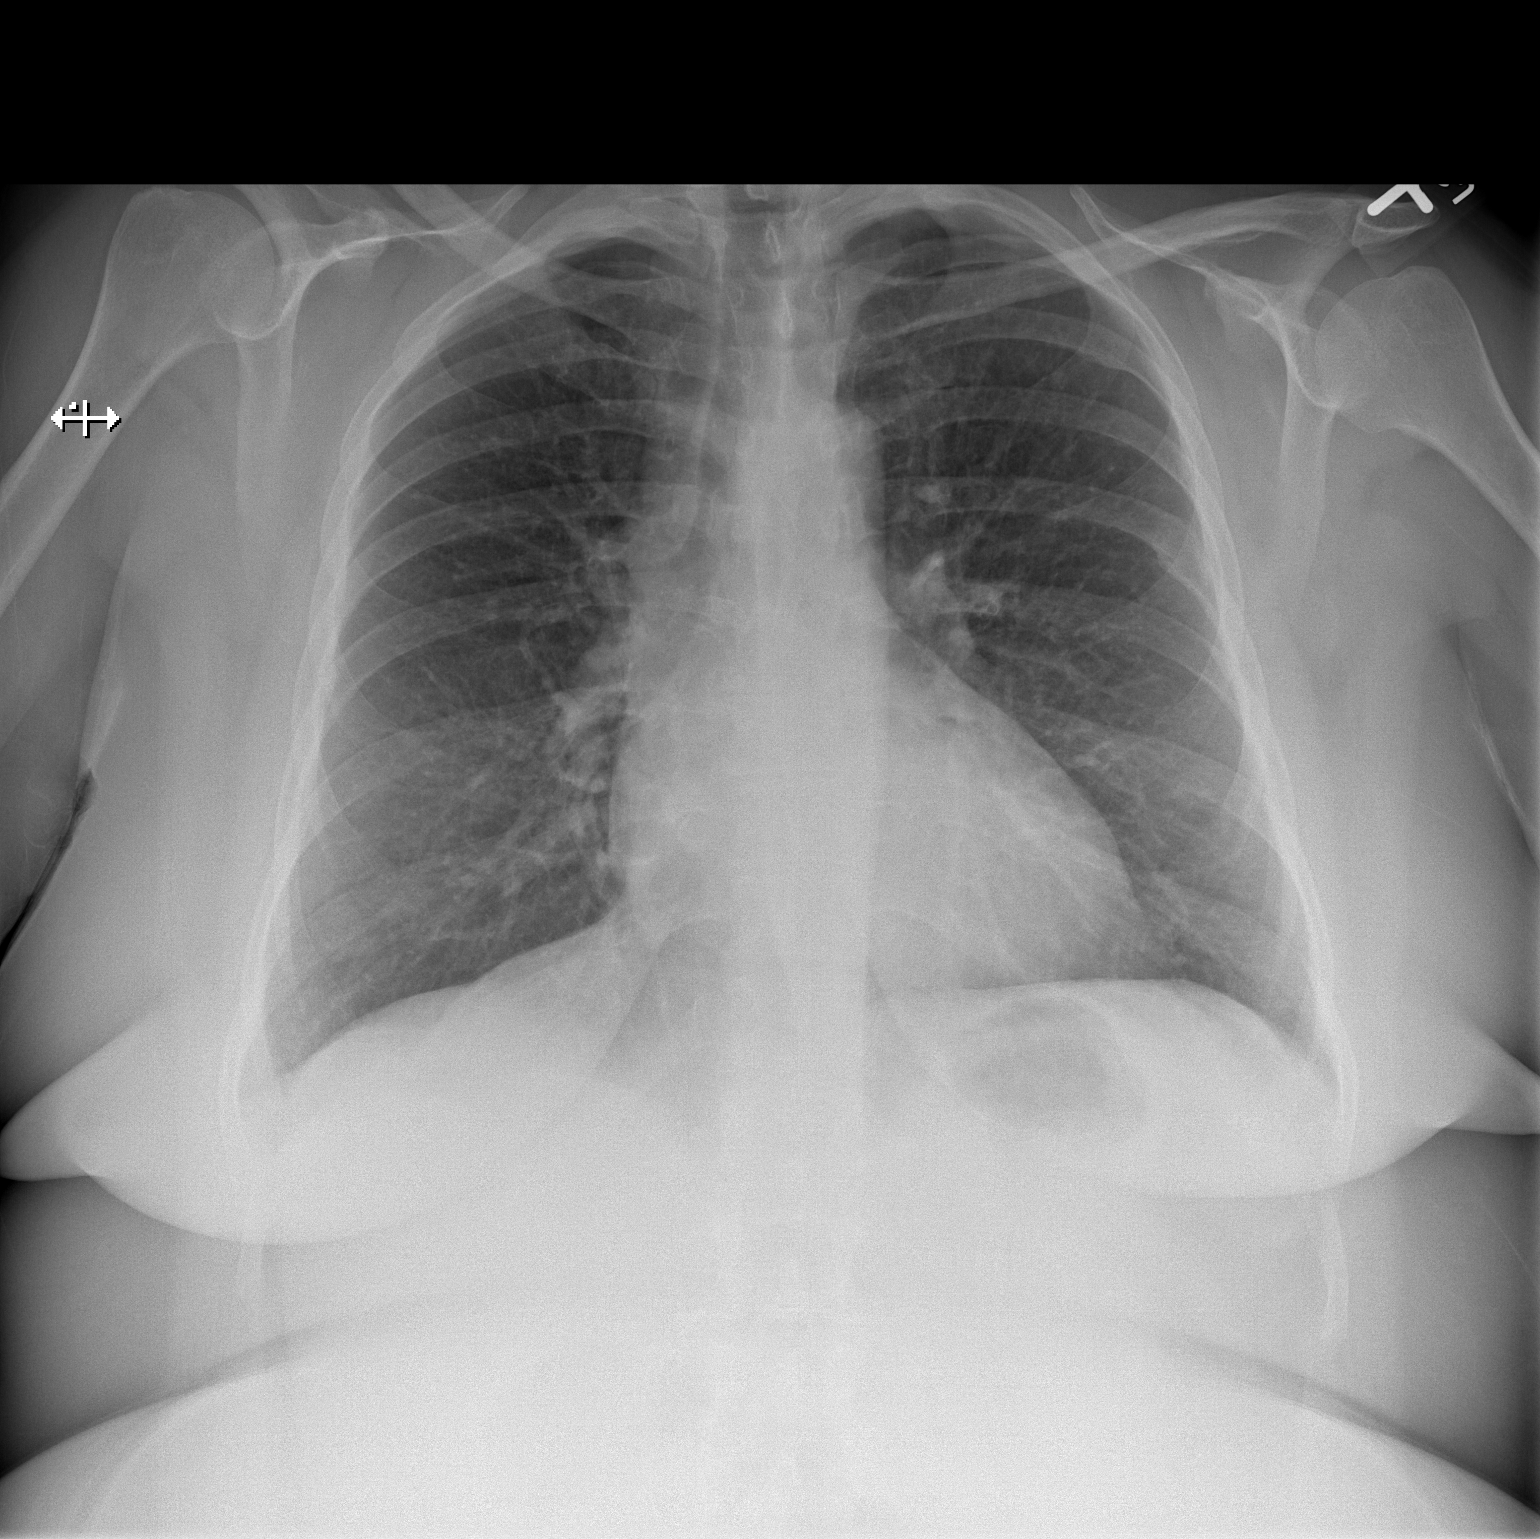
[im 2/2]
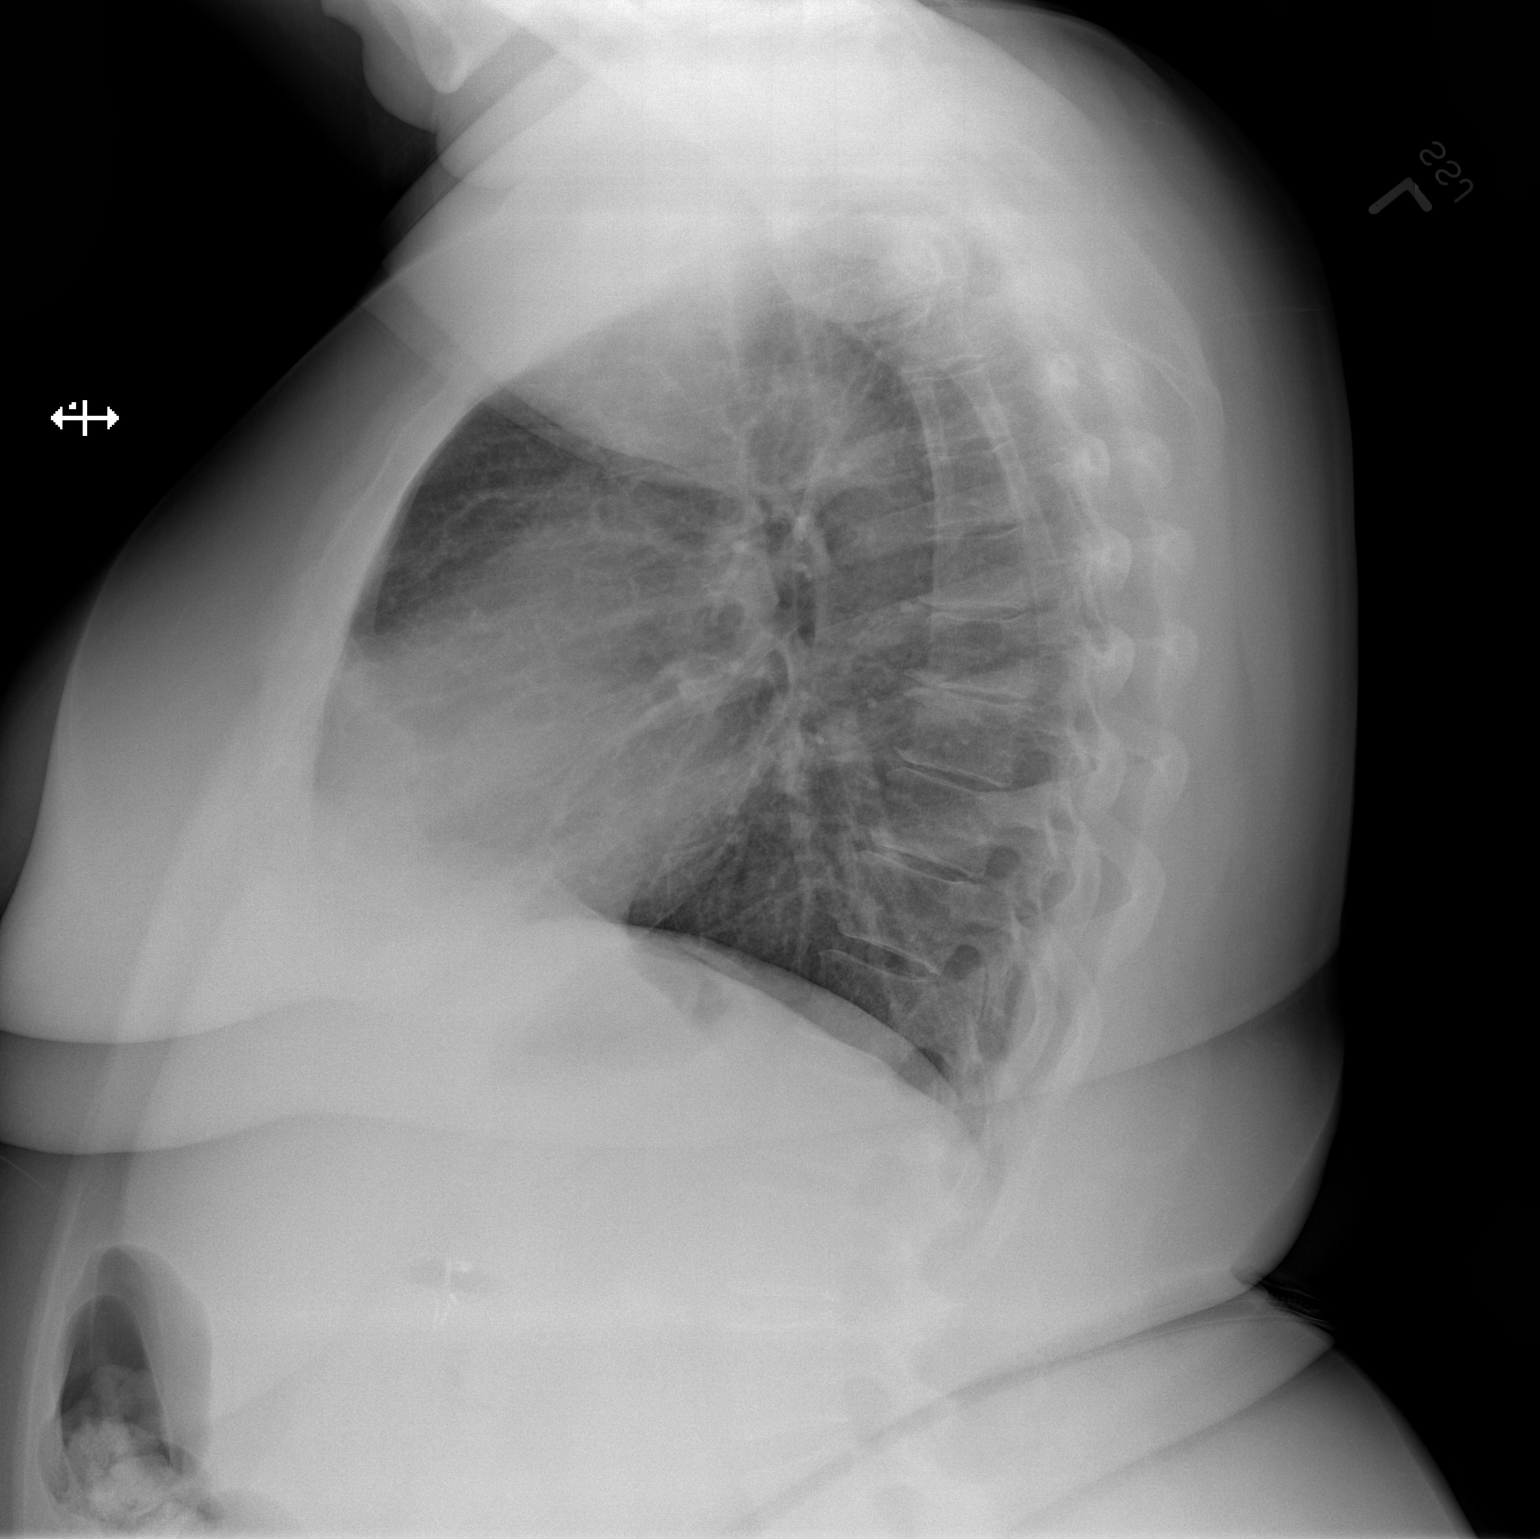

[2 of 2 positions shown; findings below may reference images not displayed]

FINDINGS: The heart size and mediastinal contours are within normal limits.
Both lungs are clear. Old fracture deformity of the left
posterolateral 6th rib again seen. Mild degenerative disc disease
noted in midthoracic spine.
IMPRESSION: No active cardiopulmonary disease.

## 2019-12-06 ENCOUNTER — Ambulatory Visit: Payer: Medicaid Other | Admitting: Urology

## 2019-12-07 ENCOUNTER — Other Ambulatory Visit: Payer: Self-pay

## 2019-12-07 ENCOUNTER — Ambulatory Visit (INDEPENDENT_AMBULATORY_CARE_PROVIDER_SITE_OTHER): Payer: Medicaid Other | Admitting: Family Medicine

## 2019-12-07 ENCOUNTER — Encounter: Payer: Self-pay | Admitting: Family Medicine

## 2019-12-07 VITALS — BP 141/91 | HR 85 | Temp 98.2°F | Resp 16 | Ht 59.0 in | Wt 129.6 lb

## 2019-12-07 DIAGNOSIS — F5104 Psychophysiologic insomnia: Secondary | ICD-10-CM | POA: Diagnosis not present

## 2019-12-07 DIAGNOSIS — F319 Bipolar disorder, unspecified: Secondary | ICD-10-CM

## 2019-12-07 DIAGNOSIS — G5692 Unspecified mononeuropathy of left upper limb: Secondary | ICD-10-CM

## 2019-12-07 MED ORDER — ZOLPIDEM TARTRATE 10 MG PO TABS: 10.0000 mg | ORAL_TABLET | Freq: Every evening | ORAL | 2 refills | Status: DC | PRN

## 2019-12-07 NOTE — Patient Instructions (Addendum)
Thank you for coming to the office today.  Increase Ambien to 10mg  daily at bedtime, see if this helps you sleep better and rest, to help memory  Check with Dr Melrose Nakayama about Lyrica it was ordered 09/30/19  He previously ordered Trazodone, sounds like you are not taking this anymore.  Try to get the refurbished CPAP  Memory loss likely from poor sleep  Nerve damage in arm is difficult to fix. Recommend wrist and elbow splint or support to protect arm from repetitive strain.   Please schedule a Follow-up Appointment to: Return in about 6 weeks (around 01/18/2020) for 6 week DM A1c, Foot Exam Urine Micro / Insomnia.  If you have any other questions or concerns, please feel free to call the office or send a message through [place]. You may also schedule an earlier appointment if necessary.  Additionally, you may be receiving a survey about your experience at our office within a few days to 1 week by e-mail or mail. We value your feedback.  [name], DO [facility]
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
## 2019-12-08 DIAGNOSIS — G5692 Unspecified mononeuropathy of left upper limb: Secondary | ICD-10-CM | POA: Insufficient documentation

## 2019-12-13 ENCOUNTER — Ambulatory Visit: Payer: Medicaid Other | Admitting: Urology

## 2019-12-13 ENCOUNTER — Encounter: Payer: Self-pay | Admitting: Urology

## 2019-12-22 ENCOUNTER — Other Ambulatory Visit: Payer: Self-pay | Admitting: Family Medicine

## 2019-12-22 DIAGNOSIS — J432 Centrilobular emphysema: Secondary | ICD-10-CM

## 2019-12-22 DIAGNOSIS — J410 Simple chronic bronchitis: Secondary | ICD-10-CM

## 2019-12-22 NOTE — Telephone Encounter (Signed)
Requested medication (s) are due for refill today:Yes  Requested medication (s) are on the active medication list: Yes  Last refill: 07/23/19 #30  2 refills  Future visit scheduled yes 01/18/20  Notes to clinic: not delegated  Requested Prescriptions  Pending Prescriptions Disp Refills   promethazine (PHENERGAN) 25 MG tablet [Pharmacy Med Name: Promethazine HCl 25MG  TABS] 30 tablet 2    Sig: TAKE 1 TABLET BY MOUTH EVERY SIX HOURS AS NEEDED FOR NAUSEA VOMITING      Not Delegated - Gastroenterology: Antiemetics Failed - 12/22/2019  4:25 PM      Failed - This refill cannot be delegated      Passed - Valid encounter within last 6 months    Recent Outpatient Visits           2 weeks ago Neuropathy of left upper extremity   [place], DO   1 month ago Hypotension due to drugs   [place], DO   3 months ago Unintentional weight loss   [place], DO   6 months ago Controlled type 2 diabetes mellitus with complication, without long-term current use of insulin ([place])   [facility] Olin Hauser, DO   1 year ago Annual physical exam   [place], DO       Future Appointments             In 3 weeks [place], Devonne Doughty, DO [facility]             Signed Prescriptions Disp Refills   SYMBICORT 160-4.5 MCG/ACT inhaler 10.2 g 0    Sig: INHALE 2 PUFFS INTO THE LUNGS IN THE MORNING AND AT BEDTIME.      Pulmonology:  Combination Products Passed - 12/22/2019  4:25 PM      Passed - Valid encounter within last 12 months    Recent Outpatient Visits           2 weeks ago Neuropathy of left upper extremity   [place], DO   1 month ago Hypotension due to drugs   [place], DO   3  months ago Unintentional weight loss   [place], DO   6 months ago Controlled type 2 diabetes mellitus with complication, without long-term current use of insulin ([place])   [place], DO   1 year ago Annual physical exam   [place], DO       Future Appointments             In 3 weeks [place], Devonne Doughty, [facility]              albuterol (PROAIR HFA) 108 (90 Base) MCG/ACT inhaler 8.5 g 0    Sig: INHALE 2 PUFFS INTO LUNGS EVERY FOUR HOURS AS NEEDED FOR WHEEZING OR SHORTNESS OF BREATH      Pulmonology:  Beta Agonists Failed - 12/22/2019  4:25 PM      Failed - One inhaler should last at least one month. If the patient is requesting refills earlier, contact the patient to check for uncontrolled symptoms.      Passed - Valid encounter within last 12 months    Recent Outpatient Visits  2 weeks ago Neuropathy of left upper extremity   [place], DO   1 month ago Hypotension due to drugs   [place], DO   3 months ago Unintentional weight loss   [place], DO   6 months ago Controlled type 2 diabetes mellitus with complication, without long-term current use of insulin [facility])   [facility] Olin Hauser, DO   1 year ago Annual physical exam   [place], DO       Future Appointments             In 3 weeks [place], Devonne Doughty, [facility], [facility]

## 2020-01-18 ENCOUNTER — Ambulatory Visit: Payer: Medicaid Other | Admitting: Family Medicine

## 2020-01-21 DIAGNOSIS — F3189 Other bipolar disorder: Secondary | ICD-10-CM | POA: Diagnosis not present

## 2020-01-21 DIAGNOSIS — M5136 Other intervertebral disc degeneration, lumbar region: Secondary | ICD-10-CM | POA: Diagnosis not present

## 2020-01-21 DIAGNOSIS — M25551 Pain in right hip: Secondary | ICD-10-CM | POA: Diagnosis not present

## 2020-01-21 DIAGNOSIS — M62830 Muscle spasm of back: Secondary | ICD-10-CM | POA: Diagnosis not present

## 2020-01-21 DIAGNOSIS — M1611 Unilateral primary osteoarthritis, right hip: Secondary | ICD-10-CM | POA: Diagnosis not present

## 2020-01-26 ENCOUNTER — Other Ambulatory Visit: Payer: Self-pay | Admitting: Family Medicine

## 2020-01-26 DIAGNOSIS — J410 Simple chronic bronchitis: Secondary | ICD-10-CM

## 2020-01-26 NOTE — Telephone Encounter (Signed)
Requested medication (s) are due for refill today - less then 1 month since last fill  Requested medication (s) are on the active medication list -yes  Future visit scheduled -no  Last refill: 01/03/20  Notes to clinic: Attempted to call patient to check symptom status- left message to call office. RF request sent for review- fail RF protocol-too soon  Requested Prescriptions  Pending Prescriptions Disp Refills   PROAIR HFA 108 (90 Base) MCG/ACT inhaler [Pharmacy Med Name: ProAir HFA 108 (90 Base)MCG/ACT AERS] 8.5 g 0    Sig: INHALE 2 PUFFS INTO LUNGS EVERY FOUR HOURS AS NEEDED FOR WHEEZING OR SHORTNESS OF BREATH      Pulmonology:  Beta Agonists Failed - 01/26/2020  9:07 AM      Failed - One inhaler should last at least one month. If the patient is requesting refills earlier, contact the patient to check for uncontrolled symptoms.      Passed - Valid encounter within last 12 months    Recent Outpatient Visits           1 month ago Neuropathy of left upper extremity   [place], DO   3 months ago Hypotension due to drugs   [place], DO   4 months ago Unintentional weight loss   [place], DO   8 months ago Controlled type 2 diabetes mellitus with complication, without long-term current use of insulin ([place])   [facility] Olin Hauser, DO   1 year ago Annual physical exam   [facility] Olin Hauser, DO                  Requested Prescriptions  Pending Prescriptions Disp Refills   PROAIR HFA 108 (90 Base) MCG/ACT inhaler [Pharmacy Med Name: ProAir HFA 108 (90 Base)MCG/ACT AERS] 8.5 g 0    Sig: INHALE 2 PUFFS INTO LUNGS EVERY FOUR HOURS AS NEEDED FOR WHEEZING OR SHORTNESS OF BREATH      Pulmonology:  Beta Agonists Failed - 01/26/2020  9:07 AM      Failed - One inhaler should last at least  one month. If the patient is requesting refills earlier, contact the patient to check for uncontrolled symptoms.      Passed - Valid encounter within last 12 months    Recent Outpatient Visits           1 month ago Neuropathy of left upper extremity   [place], DO   3 months ago Hypotension due to drugs   [place], DO   4 months ago Unintentional weight loss   [place], DO   8 months ago Controlled type 2 diabetes mellitus with complication, without long-term current use of insulin [facility])   [place], DO   1 year ago Annual physical exam   [place], DO

## 2020-01-27 ENCOUNTER — Other Ambulatory Visit: Payer: Self-pay | Admitting: Family Medicine

## 2020-01-27 ENCOUNTER — Other Ambulatory Visit: Payer: Self-pay | Admitting: Obstetrics & Gynecology

## 2020-01-27 DIAGNOSIS — J410 Simple chronic bronchitis: Secondary | ICD-10-CM

## 2020-01-27 DIAGNOSIS — J432 Centrilobular emphysema: Secondary | ICD-10-CM

## 2020-01-28 ENCOUNTER — Other Ambulatory Visit: Payer: Self-pay | Admitting: Obstetrics & Gynecology

## 2020-01-28 NOTE — Telephone Encounter (Signed)
Called and left voicemail for patient to call back to be scheduled for annual exam with Dr. Kenton Kingfisher.

## 2020-02-14 ENCOUNTER — Ambulatory Visit: Payer: Medicaid Other | Admitting: Urology

## 2020-02-15 ENCOUNTER — Encounter: Payer: Self-pay | Admitting: Urology

## 2020-02-22 ENCOUNTER — Other Ambulatory Visit: Payer: Self-pay | Admitting: Family Medicine

## 2020-02-22 DIAGNOSIS — J410 Simple chronic bronchitis: Secondary | ICD-10-CM

## 2020-02-22 DIAGNOSIS — J3089 Other allergic rhinitis: Secondary | ICD-10-CM

## 2020-02-22 DIAGNOSIS — K219 Gastro-esophageal reflux disease without esophagitis: Secondary | ICD-10-CM

## 2020-02-22 DIAGNOSIS — J0110 Acute frontal sinusitis, unspecified: Secondary | ICD-10-CM

## 2020-03-08 ENCOUNTER — Other Ambulatory Visit: Payer: Self-pay

## 2020-03-08 ENCOUNTER — Other Ambulatory Visit: Payer: Self-pay | Admitting: Physical Medicine & Rehabilitation

## 2020-03-08 ENCOUNTER — Ambulatory Visit
Admission: RE | Admit: 2020-03-08 | Discharge: 2020-03-08 | Disposition: A | Discharge status: Home / Self Care | Payer: Medicaid Other | Source: Ambulatory Visit | Attending: Physical Medicine & Rehabilitation | Admitting: Physical Medicine & Rehabilitation

## 2020-03-08 DIAGNOSIS — R29898 Other symptoms and signs involving the musculoskeletal system: Secondary | ICD-10-CM

## 2020-03-08 DIAGNOSIS — M5442 Lumbago with sciatica, left side: Secondary | ICD-10-CM | POA: Diagnosis not present

## 2020-03-08 DIAGNOSIS — M545 Low back pain, unspecified: Secondary | ICD-10-CM | POA: Diagnosis not present

## 2020-03-15 DIAGNOSIS — R200 Anesthesia of skin: Secondary | ICD-10-CM | POA: Diagnosis not present

## 2020-03-15 DIAGNOSIS — M25551 Pain in right hip: Secondary | ICD-10-CM | POA: Diagnosis not present

## 2020-03-15 DIAGNOSIS — E118 Type 2 diabetes mellitus with unspecified complications: Secondary | ICD-10-CM | POA: Diagnosis not present

## 2020-03-15 DIAGNOSIS — M5442 Lumbago with sciatica, left side: Secondary | ICD-10-CM | POA: Diagnosis not present

## 2020-03-15 DIAGNOSIS — M48061 Spinal stenosis, lumbar region without neurogenic claudication: Secondary | ICD-10-CM | POA: Diagnosis not present

## 2020-03-15 DIAGNOSIS — M545 Low back pain, unspecified: Secondary | ICD-10-CM | POA: Insufficient documentation

## 2020-03-15 DIAGNOSIS — R251 Tremor, unspecified: Secondary | ICD-10-CM | POA: Diagnosis not present

## 2020-03-17 DIAGNOSIS — F3189 Other bipolar disorder: Secondary | ICD-10-CM | POA: Diagnosis not present

## 2020-03-20 ENCOUNTER — Ambulatory Visit: Payer: Medicaid Other | Admitting: Family Medicine

## 2020-03-20 ENCOUNTER — Ambulatory Visit (INDEPENDENT_AMBULATORY_CARE_PROVIDER_SITE_OTHER): Payer: Medicaid Other | Admitting: Urology

## 2020-03-20 ENCOUNTER — Encounter: Payer: Self-pay | Admitting: Urology

## 2020-03-20 ENCOUNTER — Other Ambulatory Visit: Payer: Self-pay

## 2020-03-20 VITALS — BP 134/85 | HR 83

## 2020-03-20 DIAGNOSIS — N3946 Mixed incontinence: Secondary | ICD-10-CM | POA: Diagnosis not present

## 2020-03-20 MED ORDER — CIPROFLOXACIN HCL 250 MG PO TABS: 250.0000 mg | ORAL_TABLET | Freq: Two times a day (BID) | ORAL | 0 refills | Status: DC

## 2020-03-20 NOTE — Progress Notes (Signed)
03/20/2020 1:30 PM   Darlene Barber [birth year] [id number]  Referring provider: Olin Hauser, DO [address] [place],  [place] [address]  No chief complaint on file.   HPI: Patient was consulted for worsening incontinence over 1 year especially since November. She leaks with coughing sneezing standing and bending. She has urge incontinence that is quite sudden. I think she has small volume bedwetting.She wears 3 pads a day that can be quite wet.  She voids every 2 or 3 hours and is no nocturia. Her flow is good.   Mild grade 2 hypermobility of the bladder neck with a negative cough test with a mild cough. Grade 1 cystocele and no rectocele  The patient has mixed incontinence and mild bedwetting. Both components seem to be quite significant.  On urodynamics the patient was catheterized for 100 mL. Bladder capacity was 622 mL. The bladder was unstable reaching a pressure of 5 cm of waterfelt asurgency but she did not leak. She did note that she leaks when she goes from a sitting to standing position and that her urges will hit her suddenly. She did not leak with a Valsalva pressure of 122 cm of water. During voluntary voiding she voided 575 mL. Maximal flow was 8 mils per second. Maximum voiding pressure is 38 semis water. She emptied efficiently. She was straining some. EMG activity increased during the voiding phase. Bladder neck descent at 1 or 2 cm.   The patient has mild stress incontinence and clinically an overactive bladder with urge incontinence and mild bedwetting. I will try to help her with medical and behavioral therapy. She may be a candidate to consider a sling in the future and I would want to re-quantitate her stress component. Ongoing OAB symptoms and bedwetting would need to be discusse She did not respond to Myrbetriq  Patient leaks with coughing but not that much unless she has a bad cough with bronchitis. She is trying to stop  smoking.   Reassess in 2 months. Detrol LA and oxybutynin ER 10 mg given. Re-quantitate goals and the role of a sling versus refractory therapy.  Patient has minimal stress incontinence unless she has bronchitis. She still has urge incontinence with very little warning as her primary symptom. Because of insurance we did not talk about percutaneous tibial nerve stimulation. We talked about Botox and InterStim with usual templates. She would like to try Botox first to try to avoid surgery.  Patient did really well with first Botox and will only start taking 3 weeks ago.  Second Botox treatment September 2020  Currently no pads.  Rare urge incontinence.  No bedwetting.  Still leaks with bronchitis but does great.  Very pleased.  Clinically not infected  Today .  The patient was doing great until December of this last year.  Her last treatment was in September 2020.  Clinically not infected.  Frequency stable.  She gets very little warning and then can gush urine.  Still has stress incontinence with coughing and bronchitis.  Episodes can be high-volume but she does not wear a pad    PMH: Past Medical History:  Diagnosis Date  . Allergy   . Anxiety   . Bursitis of both hips   . Depression   . Frequent headaches   . Obesity   . Sleep apnea    doesn't use CPAP machine broken,     Surgical History: Past Surgical History:  Procedure Laterality Date  . CESAREAN SECTION  x2  . CHOLECYSTECTOMY    . COLONOSCOPY WITH PROPOFOL N/A 11/20/2017   Procedure: COLONOSCOPY WITH PROPOFOL;  Surgeon: Lin Landsman, MD;  Location: [facility] ENDOSCOPY;  Service: Gastroenterology;  Laterality: N/A;  . HYSTERECTOMY ABDOMINAL WITH SALPINGECTOMY  1998  . HYSTEROSCOPY      Home Medications:  Allergies as of 03/20/2020      Reactions   [place] Other (See Comments)   Black pepper Feels like throat is closing, itchy Feels like throat is closing, itchy Feels like throat is closing, itchy    Tape Rash   Paper tape is ok to use.      Medication List       Accurate as of March 20, 2020  1:30 PM. If you have any questions, ask your nurse or doctor.        albuterol 108 (90 Base) MCG/ACT inhaler Commonly known as: ProAir HFA INHALE 2 PUFFS INTO LUNGS EVERY FOUR HOURS AS NEEDED FOR WHEEZING OR SHORTNESS OF BREATH   benztropine 0.5 MG tablet Commonly known as: COGENTIN Take by mouth.   Bydureon BCise 2 MG/0.85ML [place] Generic drug: Exenatide ER INJECT 2 MG INTO THE SKIN ONCE A WEEK   clobetasol cream 0.05 % Commonly known as: TEMOVATE Frequency:BID   Dosage:0.0     Instructions:  Note:Dose: 0.05 %   estradiol 1 MG tablet Commonly known as: ESTRACE TAKE 1 TABLET (1 MG TOTAL) BY MOUTH DAILY.   etodolac 400 MG tablet Commonly known as: LODINE Take 400 mg by mouth 2 (two) times daily.   fluticasone 50 MCG/ACT nasal spray Commonly known as: FLONASE SPRAY 1 SPRAY INTO EACH NOSTRIL ONCE DAILY.   gabapentin 100 MG capsule Commonly known as: NEURONTIN TAKE 1 CAPSULE (100 MG TOTAL) BY MOUTH 3 (THREE) TIMES DAILY.   lurasidone 80 MG Tabs tablet Commonly known as: LATUDA Take 80 mg by mouth.   Melatonin 5 MG Caps Take by mouth.   omeprazole 40 MG capsule Commonly known as: PRILOSEC TAKE 1 CAPSULE BY MOUTH TWICE A DAY 30 MINUTES BEFORE BREAKFAST AND DINNER   promethazine 25 MG tablet Commonly known as: PHENERGAN TAKE 1 TABLET BY MOUTH EVERY SIX HOURS AS NEEDED FOR NAUSEA VOMITING   senna 8.6 MG Tabs tablet Commonly known as: SENOKOT Take 1 tablet by mouth daily.   Spiriva HandiHaler 18 MCG inhalation capsule Generic drug: tiotropium PLACE 1 CAPSULE (18 MCG TOTAL) INTO INHALER AND INHALE DAILY.   Symbicort 160-4.5 MCG/ACT inhaler Generic drug: budesonide-formoterol INHALE 2 PUFFS INTO THE LUNGS IN THE MORNING AND AT BEDTIME.   valACYclovir 1000 MG tablet Commonly known as: VALTREX TAKE 1 TABLET BY MOUTH DAILY   vitamin B-12 500 MCG tablet Commonly  known as: CYANOCOBALAMIN Take 500 mcg by mouth daily.   zolpidem 10 MG tablet Commonly known as: AMBIEN Take 1 tablet (10 mg total) by mouth at bedtime as needed for sleep.       Allergies:  Allergies  Allergen Reactions  . [place] Other (See Comments)    Black pepper Feels like throat is closing, itchy Feels like throat is closing, itchy Feels like throat is closing, itchy  . Tape Rash    Paper tape is ok to use.    Family History: Family History  Problem Relation Age of Onset  . Heart failure Maternal Grandmother   . Alzheimer's disease Maternal Grandfather   . Emphysema Paternal Grandmother   . Hodgkin's lymphoma Mother   . Diabetes Maternal Aunt   . Diabetes Maternal Uncle   .  Cancer Other   . Breast cancer Neg Hx     Social History:  reports that she has been smoking cigarettes. She has a 30.00 pack-year smoking history. She uses smokeless tobacco. She reports current alcohol use. She reports current drug use. Drug: Marijuana.  ROS:                                        Physical Exam: There were no vitals taken for this visit.  Constitutional:  Alert and oriented, No acute distress.  Laboratory Data: Lab Results  Component Value Date   WBC 5.8 09/30/2019   HGB 14.7 09/30/2019   HCT 43.8 09/30/2019   MCV 99.5 09/30/2019   PLT 245 09/30/2019    Lab Results  Component Value Date   CREATININE 0.83 09/30/2019    No results found for: PSA  No results found for: TESTOSTERONE  Lab Results  Component Value Date   HGBA1C 5.7 (A) 05/31/2019    Urinalysis    Component Value Date/Time   COLORURINE Yellow 04/20/2012 1725   APPEARANCEUR Cloudy (A) 11/30/2018 1055   LABSPEC 1.010 04/20/2012 1725   PHURINE 6.0 04/20/2012 1725   GLUCOSEU Negative 11/30/2018 1055   GLUCOSEU Negative 04/20/2012 1725   HGBUR Negative 04/20/2012 1725   BILIRUBINUR Negative 11/30/2018 1055   BILIRUBINUR Negative 04/20/2012 1725   KETONESUR Negative  04/20/2012 1725   PROTEINUR 1+ (A) 11/30/2018 1055   PROTEINUR Negative 04/20/2012 1725   NITRITE Negative 11/30/2018 1055   NITRITE Negative 04/20/2012 1725   LEUKOCYTESUR Negative 11/30/2018 1055   LEUKOCYTESUR Negative 04/20/2012 1725    Pertinent Imaging:   Assessment & Plan: Begin cystoscopy repeat Botox  There are no diagnoses linked to this encounter.  No follow-ups on file.  Reece Packer, MD  [facility] [address], [place], [place] [address] [phone]

## 2020-03-20 NOTE — Patient Instructions (Signed)
We will call you to setup your Botox after insurance is verified.

## 2020-03-21 ENCOUNTER — Ambulatory Visit: Payer: Medicaid Other | Admitting: Family Medicine

## 2020-03-22 ENCOUNTER — Other Ambulatory Visit: Payer: Self-pay | Admitting: *Deleted

## 2020-03-22 NOTE — Patient Instructions (Signed)
Visit Information  [name]  - as a part of your Medicaid benefit, you are eligible for care management and care coordination services at no cost or copay. I was unable to reach you by phone today but would be happy to help you with your health related needs. Please feel free to call me @ [phone].   A member of the Managed Medicaid care management team will reach out to you again over the next 7-14 days.   [name] RN, BSN [place]  [employer] manager

## 2020-03-22 NOTE — Patient Outreach (Signed)
Care Coordination  03/22/2020  [name] Jul 01, 1965 [id number]    Medicaid Managed Care   Unsuccessful Outreach Note  03/22/2020 Name: Darlene Barber MRN: [id number] DOB: 1965/08/07  Referred by: Olin Hauser, DO Reason for referral : No chief complaint on file.   An unsuccessful telephone outreach was attempted today. The patient was referred to the case management team for assistance with care management and care coordination.   Follow Up Plan: The care management team will reach out to the patient again over the next 7-14 days.   [name] RN, BSN [place]  [employer] manager

## 2020-03-28 ENCOUNTER — Other Ambulatory Visit: Payer: Self-pay

## 2020-03-28 ENCOUNTER — Other Ambulatory Visit: Payer: Self-pay | Admitting: Family Medicine

## 2020-03-28 ENCOUNTER — Encounter: Payer: Self-pay | Admitting: Family Medicine

## 2020-03-28 ENCOUNTER — Ambulatory Visit: Payer: Medicaid Other | Admitting: Family Medicine

## 2020-03-28 VITALS — BP 138/84 | HR 80 | Ht 59.0 in | Wt 132.6 lb

## 2020-03-28 DIAGNOSIS — F5104 Psychophysiologic insomnia: Secondary | ICD-10-CM

## 2020-03-28 DIAGNOSIS — M8949 Other hypertrophic osteoarthropathy, multiple sites: Secondary | ICD-10-CM | POA: Diagnosis not present

## 2020-03-28 DIAGNOSIS — F319 Bipolar disorder, unspecified: Secondary | ICD-10-CM

## 2020-03-28 DIAGNOSIS — Z23 Encounter for immunization: Secondary | ICD-10-CM | POA: Diagnosis not present

## 2020-03-28 DIAGNOSIS — M159 Polyosteoarthritis, unspecified: Secondary | ICD-10-CM

## 2020-03-28 MED ORDER — TIZANIDINE HCL 2 MG PO CAPS: 2.0000 mg | ORAL_CAPSULE | Freq: Three times a day (TID) | ORAL | 2 refills | Status: DC | PRN

## 2020-03-28 MED ORDER — ZOLPIDEM TARTRATE 10 MG PO TABS: 10.0000 mg | ORAL_TABLET | Freq: Every evening | ORAL | 5 refills | Status: DC | PRN

## 2020-03-28 MED ORDER — MELOXICAM 15 MG PO TABS: 15.0000 mg | ORAL_TABLET | Freq: Every day | ORAL | 2 refills | Status: DC | PRN

## 2020-03-28 NOTE — Progress Notes (Signed)
Subjective:    Patient ID: Darlene Barber, female    DOB: 1965-03-28, 55 y.o.   MRN: [id number]  Darlene Barber is a 55 y.o. female presenting on 03/28/2020 for Irritable Bowel Syndrome and Arthritis   HPI  Left-sided Back Pain Osteoarthritis, multiple joints in body Followed by Dr Melrose [facility] Dr Sharlet Salina with injections Describes location bilateral hands, Left toes, low back radiating into lower leg, knees, hips. Needs rx anti inflammatory and something other than anti inflammatory to help. Previously on Meloxicam needs a re order. They ordered Hydrocodone and Prednisone limited relief Muscle spasms not on muscle relaxant anymore, tried flexeril in past limited results, also baclofen  Bipolar Insomnia Followed by Psychiatry at [place] has difficulty sleeping. She is lon Latuda, Nortriptyline, Trazodone Needs inc pill count ambien, it works better for her but not taking nightly since only getting 15 per month  History of COVID 4 months ago, lasted 7 days  Health Maintenance:  Due for Flu Shot, will receive today    Depression screen [facility] 2/9 03/28/2020 12/01/2018 08/21/2018  Decreased Interest 1 0 0  Down, Depressed, Hopeless 1 1 0  PHQ - 2 Score 2 1 0  Altered sleeping '3 3 2  '$ Tired, decreased energy '3 3 2  '$ Change in appetite '1 1 3  '$ Feeling bad or failure about yourself  0 0 0  Trouble concentrating 0 0 0  Moving slowly or fidgety/restless 0 0 0  Suicidal thoughts 0 0 0  PHQ-9 Score '9 8 7  '$ Difficult doing work/chores Somewhat difficult Somewhat difficult Not difficult at all    Social History   Tobacco Use  . Smoking status: Current Every Day Smoker    Packs/day: 1.00    Years: 30.00    Pack years: 30.00    Types: Cigarettes  . Smokeless tobacco: Current User  Vaping Use  . Vaping Use: Never used  Substance Use Topics  . Alcohol use: Yes    Comment: occ once per year  . Drug use: Yes    Types: Marijuana    Comment: Current  use    Review of Systems Per HPI unless specifically indicated above     Objective:    BP 138/84 (BP Location: Left Arm, Cuff Size: Normal)   Pulse 80   Ht '4\' 11"'$  (1.499 m)   Wt 132 lb 9.6 oz (60.1 kg)   SpO2 100%   BMI 26.78 kg/m   Wt Readings from Last 3 Encounters:  03/28/20 132 lb 9.6 oz (60.1 kg)  12/07/19 129 lb 9.6 oz (58.8 kg)  10/26/19 131 lb (59.4 kg)    Physical Exam Vitals and nursing note reviewed.  Constitutional:      General: She is not in acute distress.    Appearance: She is well-developed and well-nourished. She is not diaphoretic.     Comments: Well-appearing, in generalized pain, cooperative  HENT:     Head: Normocephalic and atraumatic.     Mouth/Throat:     Mouth: Oropharynx is clear and moist.  Eyes:     General:        Right eye: No discharge.        Left eye: No discharge.     Conjunctiva/sclera: Conjunctivae normal.  Cardiovascular:     Rate and Rhythm: Normal rate.  Pulmonary:     Effort: Pulmonary effort is normal.  Musculoskeletal:        General: No edema.  Skin:  General: Skin is warm and dry.     Findings: No erythema or rash.  Neurological:     Mental Status: She is alert and oriented to person, place, and time.  Psychiatric:        Mood and Affect: Mood and affect normal.        Behavior: Behavior normal.     Comments: Well groomed, good eye contact, normal speech and thoughts    Results for orders placed or performed in visit on 09/29/19  COMPLETE METABOLIC PANEL WITH GFR  Result Value Ref Range   Glucose, Bld 139 (H) 65 - 99 mg/dL   BUN 11 7 - 25 mg/dL   Creat 0.83 0.50 - 1.05 mg/dL   GFR, Est Non African American 81 > OR = 60 mL/min/1.74m   GFR, Est African American 93 > OR = 60 mL/min/1.770m  BUN/Creatinine Ratio NOT APPLICABLE 6 - 22 (calc)   Sodium 140 135 - 146 mmol/L   Potassium 4.2 3.5 - 5.3 mmol/L   Chloride 109 98 - 110 mmol/L   CO2 23 20 - 32 mmol/L   Calcium 9.5 8.6 - 10.4 mg/dL   Total Protein 6.3  6.1 - 8.1 g/dL   Albumin 3.9 3.6 - 5.1 g/dL   Globulin 2.4 1.9 - 3.7 g/dL (calc)   AG Ratio 1.6 1.0 - 2.5 (calc)   Total Bilirubin 0.6 0.2 - 1.2 mg/dL   Alkaline phosphatase (APISO) 70 37 - 153 U/L   AST 14 10 - 35 U/L   ALT 14 6 - 29 U/L  CBC with Differential/Platelet  Result Value Ref Range   WBC 5.8 3.8 - 10.8 Thousand/uL   RBC 4.40 3.80 - 5.10 Million/uL   Hemoglobin 14.7 11.7 - 15.5 g/dL   HCT 43.8 35.0 - 45.0 %   MCV 99.5 80.0 - 100.0 fL   MCH 33.4 (H) 27.0 - 33.0 pg   MCHC 33.6 32.0 - 36.0 g/dL   RDW 14.0 11.0 - 15.0 %   Platelets 245 140 - 400 Thousand/uL   MPV 12.2 7.5 - 12.5 fL   Neutro Abs 3,155 1,500 - 7,800 cells/uL   Lymphs Abs 2,001 850 - 3,900 cells/uL   Absolute Monocytes 383 200 - 950 cells/uL   Eosinophils Absolute 203 15 - 500 cells/uL   Basophils Absolute 58 0 - 200 cells/uL   Neutrophils Relative % 54.4 %   Total Lymphocyte 34.5 %   Monocytes Relative 6.6 %   Eosinophils Relative 3.5 %   Basophils Relative 1.0 %  VITAMIN D 25 Hydroxy (Vit-D Deficiency, Fractures)  Result Value Ref Range   Vit D, 25-Hydroxy 23 (L) 30 - 100 ng/mL  TSH  Result Value Ref Range   TSH 0.93 mIU/L  Vitamin B12  Result Value Ref Range   Vitamin B-12 408 200 - 1,100 pg/mL      Assessment & Plan:   Problem List Items Addressed This Visit    Primary osteoarthritis involving multiple joints (Chronic)   Relevant Medications   meloxicam (MOBIC) 15 MG tablet   tizanidine (ZANAFLEX) 2 MG capsule   Insomnia - Primary   Relevant Medications   zolpidem (AMBIEN) 10 MG tablet   Bipolar depression (HCC)    Other Visit Diagnoses    Needs flu shot       Relevant Orders   Flu Vaccine QUAD 36+ mos IM      #Bipolar Depression Mostly controlled mood [facility] On Latuda, Trazodone Checked PDMP, I  am managing her sleeping med Ambien, previously 15 per month will inc count to 30 per month, signed her up for GoodRx through [facility] to use this more often for  sleep/insomnia She should continue other bipolar med management from [place], I discussed with her specifically today that I cannot take over all of these mental health medications  #Chronic Pain Osteoarthritis multiple joints Chronic problems for her, affecting her function Sub optimally treated She is followed by [facility] with Dr Sharlet Salina Receiving neuropathic medications and injections now recently started, will return for rest of injection series, they have ordered short term hydrocodone PRN limited results, and prednisone burst/taper  She is already on Pregabalin, previous Gabapentin, also on Nortriptyline. I will re order anti inflammatory for generalized arthritis symptoms with meloxicam '15mg'$  daily for now. Also Tizanidine '2mg'$  TID PRN muscle relaxant  She will need dedicated diabetes follow-up visit with A1c in 3 months.   Meds ordered this encounter  Medications  . zolpidem (AMBIEN) 10 MG tablet    Sig: Take 1 tablet (10 mg total) by mouth at bedtime as needed for sleep.    Dispense:  30 tablet    Refill:  5    Goodrx  . meloxicam (MOBIC) 15 MG tablet    Sig: Take 1 tablet (15 mg total) by mouth daily as needed for pain.    Dispense:  30 tablet    Refill:  2  . tizanidine (ZANAFLEX) 2 MG capsule    Sig: Take 1 capsule (2 mg total) by mouth 3 (three) times daily as needed for muscle spasms.    Dispense:  60 capsule    Refill:  2      Follow up plan: Return in about 3 months (around 06/26/2020) for 3 month DM A1c, Chronic Pain, Insomnia.   [name], [facility] 03/28/2020, 1:32 PM

## 2020-03-28 NOTE — Telephone Encounter (Signed)
Requested medication (s) are due for refill today:   Provider to determine  Requested medication (s) are on the active medication list:   Yes  Future visit scheduled:   Has appt today (Tues) at 1:20   Last ordered: Non delegated refill   Requested Prescriptions  Pending Prescriptions Disp Refills   promethazine (PHENERGAN) 25 MG tablet [Pharmacy Med Name: Promethazine HCl '25MG'$  TABS] 30 tablet 2    Sig: TAKE 1 TABLET BY MOUTH EVERY SIX HOURS AS NEEDED FOR NAUSEA VOMITING      Not Delegated - Gastroenterology: Antiemetics Failed - 03/28/2020 11:31 AM      Failed - This refill cannot be delegated      Passed - Valid encounter within last 6 months    Recent Outpatient Visits           3 months ago Neuropathy of left upper extremity   [place], DO   5 months ago Hypotension due to drugs   [place], DO   6 months ago Unintentional weight loss   [place], DO   10 months ago Controlled type 2 diabetes mellitus with complication, without long-term current use of insulin ([place])   [facility] Olin Hauser, DO   1 year ago Annual physical exam   [place], Devonne Doughty, DO       Future Appointments             Today [place] Devonne Doughty, [facility]

## 2020-03-28 NOTE — Patient Instructions (Addendum)
  Start Meloxicam '15mg'$  daily for now for anti inflammatory  Please keep with Dr Sharlet Salina for injections and treatments  We can refer to other Pain Clinic going forward if needed.  Keep on current other meds  Refilled Ambien 30 day supply - use goodrx [facility]   Please schedule a Follow-up Appointment to: Return in about 3 months (around 06/26/2020) for 3 month DM A1c, Chronic Pain, Insomnia.  If you have any other questions or concerns, please feel free to call the office or send a message through [place]. You may also schedule an earlier appointment if necessary.  Additionally, you may be receiving a survey about your experience at our office within a few days to 1 week by e-mail or mail. We value your feedback.  [name], DO [place]

## 2020-03-29 DIAGNOSIS — M48061 Spinal stenosis, lumbar region without neurogenic claudication: Secondary | ICD-10-CM | POA: Diagnosis not present

## 2020-03-29 DIAGNOSIS — M5442 Lumbago with sciatica, left side: Secondary | ICD-10-CM | POA: Diagnosis not present

## 2020-04-03 ENCOUNTER — Telehealth: Payer: Self-pay | Admitting: *Deleted

## 2020-04-03 ENCOUNTER — Other Ambulatory Visit: Payer: Self-pay | Admitting: Family Medicine

## 2020-04-03 DIAGNOSIS — E118 Type 2 diabetes mellitus with unspecified complications: Secondary | ICD-10-CM

## 2020-04-03 DIAGNOSIS — N3946 Mixed incontinence: Secondary | ICD-10-CM

## 2020-04-03 NOTE — Telephone Encounter (Signed)
No PA required for Botox treatment [id number] lab and appointment. Patient voiced understanding.

## 2020-04-10 ENCOUNTER — Other Ambulatory Visit: Payer: Self-pay

## 2020-04-10 ENCOUNTER — Encounter: Payer: Self-pay | Admitting: Urology

## 2020-04-12 ENCOUNTER — Encounter: Payer: Self-pay | Admitting: Urology

## 2020-04-12 ENCOUNTER — Other Ambulatory Visit: Payer: Self-pay

## 2020-04-13 ENCOUNTER — Other Ambulatory Visit: Payer: Self-pay

## 2020-04-13 ENCOUNTER — Encounter: Payer: Self-pay | Admitting: Urology

## 2020-04-14 ENCOUNTER — Other Ambulatory Visit: Payer: Self-pay

## 2020-04-17 ENCOUNTER — Ambulatory Visit: Payer: Self-pay | Admitting: Urology

## 2020-04-21 ENCOUNTER — Other Ambulatory Visit: Payer: Self-pay | Admitting: Obstetrics & Gynecology

## 2020-04-21 NOTE — Telephone Encounter (Signed)
Called and left generic message with patient's family member to have her give our office a call back.

## 2020-04-24 ENCOUNTER — Other Ambulatory Visit: Payer: Self-pay | Admitting: Family Medicine

## 2020-04-24 DIAGNOSIS — J432 Centrilobular emphysema: Secondary | ICD-10-CM

## 2020-04-24 DIAGNOSIS — J410 Simple chronic bronchitis: Secondary | ICD-10-CM

## 2020-04-24 NOTE — Telephone Encounter (Signed)
Attempt to reach patient. No voicemail unable to leave message

## 2020-04-24 NOTE — Telephone Encounter (Signed)
Pt called after hour nurse 2/12 at 10;18 AM; stating she is returning a call from the office.  [phone]

## 2020-04-26 ENCOUNTER — Other Ambulatory Visit: Payer: Self-pay | Admitting: Family Medicine

## 2020-04-26 DIAGNOSIS — M5442 Lumbago with sciatica, left side: Secondary | ICD-10-CM | POA: Diagnosis not present

## 2020-04-26 DIAGNOSIS — E118 Type 2 diabetes mellitus with unspecified complications: Secondary | ICD-10-CM | POA: Diagnosis not present

## 2020-04-26 DIAGNOSIS — L309 Dermatitis, unspecified: Secondary | ICD-10-CM

## 2020-04-26 DIAGNOSIS — M48061 Spinal stenosis, lumbar region without neurogenic claudication: Secondary | ICD-10-CM | POA: Diagnosis not present

## 2020-04-26 NOTE — Telephone Encounter (Signed)
Requested medication (s) are due for refill today: Yes  Requested medication (s) are on the active medication list: Yes  Last refill:  2013  Future visit scheduled: No  Notes to clinic:  Unable to refill per protocol, Rx expired.      Requested Prescriptions  Pending Prescriptions Disp Refills   clobetasol ointment (TEMOVATE) 0.05 % [Pharmacy Med Name: Clobetasol Propionate 0.05% OINT] 60 g     Sig: APPLY TO AFFECTED AREAS TWICE DAILY UNTIL IMPROVED      Dermatology:  Corticosteroids Passed - 04/26/2020  4:24 PM      Passed - Valid encounter within last 12 months    Recent Outpatient Visits           4 weeks ago Psychophysiological insomnia   [place], DO   4 months ago Neuropathy of left upper extremity   [place], DO   6 months ago Hypotension due to drugs   [place], DO   7 months ago Unintentional weight loss   [facility] Olin Hauser, DO   11 months ago Controlled type 2 diabetes mellitus with complication, without long-term current use of insulin [facility])   [place], Devonne Doughty, DO

## 2020-05-04 DIAGNOSIS — H5213 Myopia, bilateral: Secondary | ICD-10-CM | POA: Diagnosis not present

## 2020-05-08 ENCOUNTER — Other Ambulatory Visit: Payer: Self-pay

## 2020-05-08 ENCOUNTER — Other Ambulatory Visit: Payer: Medicaid Other

## 2020-05-08 DIAGNOSIS — N3946 Mixed incontinence: Secondary | ICD-10-CM | POA: Diagnosis not present

## 2020-05-09 LAB — URINALYSIS, COMPLETE
Bilirubin, UA: NEGATIVE
Glucose, UA: NEGATIVE
Ketones, UA: NEGATIVE
Leukocytes,UA: NEGATIVE
Nitrite, UA: NEGATIVE
Protein,UA: NEGATIVE
RBC, UA: NEGATIVE
Specific Gravity, UA: <1.005 — ABNORMAL LOW (ref 1.005–1.030)
Urobilinogen, Ur: 0.2 mg/dL (ref 0.2–1.0)
pH, UA: 6 (ref 5.0–7.5)

## 2020-05-09 LAB — MICROSCOPIC EXAMINATION
Bacteria, UA: NONE SEEN
Epithelial Cells (non renal): >10 /hpf — AB (ref 0–10)
RBC, Urine: NONE SEEN /hpf (ref 0–2)

## 2020-05-10 DIAGNOSIS — M5136 Other intervertebral disc degeneration, lumbar region: Secondary | ICD-10-CM | POA: Diagnosis not present

## 2020-05-10 DIAGNOSIS — M5441 Lumbago with sciatica, right side: Secondary | ICD-10-CM | POA: Diagnosis not present

## 2020-05-10 DIAGNOSIS — M5442 Lumbago with sciatica, left side: Secondary | ICD-10-CM | POA: Diagnosis not present

## 2020-05-10 DIAGNOSIS — M48061 Spinal stenosis, lumbar region without neurogenic claudication: Secondary | ICD-10-CM | POA: Diagnosis not present

## 2020-05-11 LAB — CULTURE, URINE COMPREHENSIVE

## 2020-05-15 ENCOUNTER — Ambulatory Visit (INDEPENDENT_AMBULATORY_CARE_PROVIDER_SITE_OTHER): Payer: Medicaid Other | Admitting: Urology

## 2020-05-15 ENCOUNTER — Ambulatory Visit: Payer: Self-pay | Admitting: Urology

## 2020-05-15 ENCOUNTER — Encounter: Payer: Self-pay | Admitting: Urology

## 2020-05-15 ENCOUNTER — Other Ambulatory Visit: Payer: Self-pay

## 2020-05-15 VITALS — BP 131/67 | HR 102 | Ht 59.0 in | Wt 132.0 lb

## 2020-05-15 DIAGNOSIS — N3946 Mixed incontinence: Secondary | ICD-10-CM | POA: Diagnosis not present

## 2020-05-15 DIAGNOSIS — N3281 Overactive bladder: Secondary | ICD-10-CM | POA: Diagnosis not present

## 2020-05-15 MED ORDER — ONABOTULINUMTOXINA 100 UNITS IJ SOLR
100.0000 [IU] | Freq: Once | INTRAMUSCULAR | Status: AC
Start: 2020-05-15 — End: 2020-05-15
  Administered 2020-05-15: 100 [IU] via INTRAMUSCULAR

## 2020-05-15 MED ORDER — LIDOCAINE HCL 2 % IJ SOLN
60.0000 mL | Freq: Once | INTRAMUSCULAR | Status: AC
  Administered 2020-05-15: 1200 mg

## 2020-05-15 NOTE — Progress Notes (Signed)
Bladder Instillation  Due to BOTOX patient is present today for a Bladder Instillation of 2% lidocaine. Patient was cleaned and prepped in a sterile fashion with betadine and lidocaine 2% jelly was instilled into the urethra.  A 14FR catheter was inserted, urine return was noted 64m, urine was yellow in color.  50 ml was instilled into the bladder. The catheter was then removed. Patient tolerated well, no complications were noted Patient held in bladder for 30 minutes prior to procedure starting.   Performed by: RVerlene Mayer CLebam

## 2020-05-15 NOTE — Progress Notes (Signed)
05/15/2020 11:40 AM   Darlene Barber Dec 19, 1965 [id number]  Referring provider: Olin Hauser, DO [address] [place],  [place] [address]  No chief complaint on file.   HPI: Reviewed last note.  Her last Botox was 10 per 2020.  She has urge incontinence that is refractory to treatment with very little warning.  They can be high-volume.  She has some stress incontinence with bronchitis.  She does very well with Botox.  Frequency stable.  Incontinence stable.  Clinically not infected  Cystoscopy: Patient underwent flexible cystoscopy.  Bladder mucosa and trigone were normal.  No cystitis.  No carcinoma.  I injected 100 units of Botox in 10 cc of normal saline primarily with the midline up-and-down template going left and right of the midline.  10 injections were used.  She tolerated it well though she was tender.  She was an excellent patient.  Should be followed as per protocol   PMH: Past Medical History:  Diagnosis Date  . Allergy   . Anxiety   . Bursitis of both hips   . Depression   . Frequent headaches   . Obesity   . Sleep apnea    doesn't use CPAP machine broken,     Surgical History: Past Surgical History:  Procedure Laterality Date  . CESAREAN SECTION     x2  . CHOLECYSTECTOMY    . COLONOSCOPY WITH PROPOFOL N/A 11/20/2017   Procedure: COLONOSCOPY WITH PROPOFOL;  Surgeon: Lin Landsman, MD;  [facility] ENDOSCOPY;  Service: Gastroenterology;  Laterality: N/A;  . HYSTERECTOMY ABDOMINAL WITH SALPINGECTOMY  1998  . HYSTEROSCOPY      Home Medications:  Allergies as of 05/15/2020      Reactions   [place] Other (See Comments)   Black pepper Feels like throat is closing, itchy Feels like throat is closing, itchy Feels like throat is closing, itchy   Tape Rash   Paper tape is ok to use.      Medication List       Accurate as of May 15, 2020 11:40 AM. If you have any questions, ask your nurse or doctor.        albuterol 108 (90 Base) MCG/ACT  inhaler Commonly known as: ProAir HFA INHALE 2 PUFFS INTO LUNGS EVERY FOUR HOURS AS NEEDED FOR WHEEZING OR SHORTNESS OF BREATH   benztropine 0.5 MG tablet Commonly known as: COGENTIN Take by mouth.   Bydureon BCise 2 MG/0.85ML [place] Generic drug: Exenatide ER INJECT 2 MG INTO THE SKIN ONCE A WEEK.   clobetasol ointment 0.05 % Commonly known as: TEMOVATE APPLY TO AFFECTED AREAS TWICE DAILY UNTIL IMPROVED   cloNIDine 0.3 MG tablet Commonly known as: CATAPRES Take 0.3 mg by mouth 2 (two) times daily.   estradiol 1 MG tablet Commonly known as: ESTRACE TAKE 1 TABLET (1 MG TOTAL) BY MOUTH DAILY.   fluticasone 50 MCG/ACT nasal spray Commonly known as: FLONASE SPRAY 1 SPRAY INTO EACH NOSTRIL ONCE DAILY.   gabapentin 100 MG capsule Commonly known as: NEURONTIN TAKE 1 CAPSULE BY MOUTH THREE TIMES A DAY   hydrOXYzine 10 MG tablet Commonly known as: ATARAX/VISTARIL Take 10 mg by mouth 3 (three) times daily.   linaclotide 145 MCG Caps capsule Commonly known as: LINZESS TAKE 1 CAPSULE (145 MCG TOTAL) BY MOUTH ONCE DAILY   lurasidone 80 MG Tabs tablet Commonly known as: LATUDA Take 80 mg by mouth.   meloxicam 15 MG tablet Commonly known as: MOBIC Take 1 tablet (15 mg  total) by mouth daily as needed for pain.   nortriptyline 25 MG capsule Commonly known as: PAMELOR Take 25 mg by mouth 2 (two) times daily.   omeprazole 40 MG capsule Commonly known as: PRILOSEC TAKE 1 CAPSULE BY MOUTH TWICE A DAY 30 MINUTES BEFORE BREAKFAST AND DINNER   Oxcarbazepine 300 MG tablet Commonly known as: TRILEPTAL Take 300 mg by mouth 3 (three) times daily.   pregabalin 100 MG capsule Commonly known as: LYRICA TAKE 1 CAPSULE BY MOUTH THREE TIMES A DAY   promethazine 25 MG tablet Commonly known as: PHENERGAN TAKE 1 TABLET BY MOUTH EVERY SIX HOURS AS NEEDED FOR NAUSEA VOMITING   senna 8.6 MG Tabs tablet Commonly known as: SENOKOT Take 1 tablet by mouth daily.   Spiriva HandiHaler 18 MCG  inhalation capsule Generic drug: tiotropium PLACE 1 CAPSULE (18 MCG TOTAL) INTO INHALER AND INHALE DAILY.   Symbicort 160-4.5 MCG/ACT inhaler Generic drug: budesonide-formoterol INHALE 2 PUFFS INTO THE LUNGS IN THE MORNING AND AT BEDTIME.   tizanidine 2 MG capsule Commonly known as: ZANAFLEX Take 1 capsule (2 mg total) by mouth 3 (three) times daily as needed for muscle spasms.   traZODone 50 MG tablet Commonly known as: DESYREL Take 3 tabs at night, can take an extra tab in the middle of night as needed   valACYclovir 1000 MG tablet Commonly known as: VALTREX TAKE 1 TABLET BY MOUTH DAILY   vitamin B-12 500 MCG tablet Commonly known as: CYANOCOBALAMIN Take 500 mcg by mouth daily.   zolpidem 10 MG tablet Commonly known as: AMBIEN Take 1 tablet (10 mg total) by mouth at bedtime as needed for sleep.       Allergies:  Allergies  Allergen Reactions  . [place] Other (See Comments)    Black pepper Feels like throat is closing, itchy Feels like throat is closing, itchy Feels like throat is closing, itchy  . Tape Rash    Paper tape is ok to use.    Family History: Family History  Problem Relation Age of Onset  . Heart failure Maternal Grandmother   . Alzheimer's disease Maternal Grandfather   . Emphysema Paternal Grandmother   . Hodgkin's lymphoma Mother   . Diabetes Maternal Aunt   . Diabetes Maternal Uncle   . Cancer Other   . Breast cancer Neg Hx     Social History:  reports that she has been smoking cigarettes. She has a 30.00 pack-year smoking history. She uses smokeless tobacco. She reports current alcohol use. She reports current drug use. Drug: Marijuana.  ROS:                                        Physical Exam: There were no vitals taken for this visit.    Laboratory Data: Lab Results  Component Value Date   WBC 5.8 09/30/2019   HGB 14.7 09/30/2019   HCT 43.8 09/30/2019   MCV 99.5 09/30/2019   PLT 245 09/30/2019    Lab  Results  Component Value Date   CREATININE 0.83 09/30/2019    No results found for: PSA  No results found for: TESTOSTERONE  Lab Results  Component Value Date   HGBA1C 5.7 (A) 05/31/2019    Urinalysis    Component Value Date/Time   COLORURINE Yellow 04/20/2012 1725   APPEARANCEUR Cloudy (A) 05/08/2020 1318   LABSPEC 1.010 04/20/2012 1725   PHURINE 6.0 04/20/2012  [place] Negative 05/08/2020 1318   GLUCOSEU Negative 04/20/2012 1725   HGBUR Negative 04/20/2012 1725   BILIRUBINUR Negative 05/08/2020 1318   BILIRUBINUR Negative 04/20/2012 1725   KETONESUR Negative 04/20/2012 1725   PROTEINUR Negative 05/08/2020 1318   PROTEINUR Negative 04/20/2012 1725   NITRITE Negative 05/08/2020 1318   NITRITE Negative 04/20/2012 1725   LEUKOCYTESUR Negative 05/08/2020 1318   LEUKOCYTESUR Negative 04/20/2012 1725    Pertinent Imaging:   Assessment & Plan: Follow as per protocol  There are no diagnoses linked to this encounter.  No follow-ups on file.  Reece Packer, MD  [place] [address] [place],  [address] [phone]

## 2020-05-16 ENCOUNTER — Telehealth: Payer: Self-pay

## 2020-05-16 ENCOUNTER — Ambulatory Visit: Payer: Medicaid Other | Admitting: Obstetrics & Gynecology

## 2020-05-16 DIAGNOSIS — E118 Type 2 diabetes mellitus with unspecified complications: Secondary | ICD-10-CM | POA: Diagnosis not present

## 2020-05-16 DIAGNOSIS — M5441 Lumbago with sciatica, right side: Secondary | ICD-10-CM | POA: Diagnosis not present

## 2020-05-16 DIAGNOSIS — M5136 Other intervertebral disc degeneration, lumbar region: Secondary | ICD-10-CM | POA: Diagnosis not present

## 2020-05-16 DIAGNOSIS — M5442 Lumbago with sciatica, left side: Secondary | ICD-10-CM | POA: Diagnosis not present

## 2020-05-16 DIAGNOSIS — M48061 Spinal stenosis, lumbar region without neurogenic claudication: Secondary | ICD-10-CM | POA: Diagnosis not present

## 2020-05-16 NOTE — Telephone Encounter (Signed)
Left message for pt to see how she was doing after botox.

## 2020-05-17 LAB — URINALYSIS, COMPLETE
Bilirubin, UA: NEGATIVE
Glucose, UA: NEGATIVE
Ketones, UA: NEGATIVE
Leukocytes,UA: NEGATIVE
Nitrite, UA: NEGATIVE
Protein,UA: NEGATIVE
RBC, UA: NEGATIVE
Specific Gravity, UA: 1.02 (ref 1.005–1.030)
Urobilinogen, Ur: 0.2 mg/dL (ref 0.2–1.0)
pH, UA: 5.5 (ref 5.0–7.5)

## 2020-05-17 LAB — MICROSCOPIC EXAMINATION
Bacteria, UA: NONE SEEN
RBC, Urine: NONE SEEN /hpf (ref 0–2)

## 2020-05-24 ENCOUNTER — Other Ambulatory Visit: Payer: Self-pay | Admitting: Family Medicine

## 2020-05-24 DIAGNOSIS — M159 Polyosteoarthritis, unspecified: Secondary | ICD-10-CM

## 2020-05-24 DIAGNOSIS — K219 Gastro-esophageal reflux disease without esophagitis: Secondary | ICD-10-CM

## 2020-05-24 DIAGNOSIS — M8949 Other hypertrophic osteoarthropathy, multiple sites: Secondary | ICD-10-CM

## 2020-05-25 DIAGNOSIS — M25551 Pain in right hip: Secondary | ICD-10-CM | POA: Diagnosis not present

## 2020-05-25 DIAGNOSIS — R200 Anesthesia of skin: Secondary | ICD-10-CM | POA: Diagnosis not present

## 2020-05-25 DIAGNOSIS — R519 Headache, unspecified: Secondary | ICD-10-CM | POA: Diagnosis not present

## 2020-05-25 DIAGNOSIS — R251 Tremor, unspecified: Secondary | ICD-10-CM | POA: Diagnosis not present

## 2020-05-29 ENCOUNTER — Ambulatory Visit: Payer: Self-pay | Admitting: Physician Assistant

## 2020-05-30 ENCOUNTER — Encounter: Payer: Self-pay | Admitting: Physician Assistant

## 2020-05-31 ENCOUNTER — Ambulatory Visit: Payer: Medicaid Other | Admitting: Obstetrics & Gynecology

## 2020-06-01 ENCOUNTER — Ambulatory Visit: Payer: Medicaid Other | Admitting: Physician Assistant

## 2020-06-06 ENCOUNTER — Ambulatory Visit (INDEPENDENT_AMBULATORY_CARE_PROVIDER_SITE_OTHER): Payer: Medicaid Other | Admitting: Physician Assistant

## 2020-06-06 ENCOUNTER — Other Ambulatory Visit: Payer: Self-pay

## 2020-06-06 ENCOUNTER — Encounter: Payer: Self-pay | Admitting: Physician Assistant

## 2020-06-06 VITALS — BP 135/81 | HR 80 | Ht 59.0 in | Wt 130.0 lb

## 2020-06-06 DIAGNOSIS — N3281 Overactive bladder: Secondary | ICD-10-CM

## 2020-06-06 LAB — BLADDER SCAN AMB NON-IMAGING

## 2020-06-06 NOTE — Progress Notes (Signed)
06/06/2020 3:58 PM   Darlene Barber 13-Jul-1965 [id number]  CC: Chief Complaint  Patient presents with  . Over Active Bladder   HPI: Darlene Barber is a 55 y.o. female with refractory OAB wet s/p intravesical Botox with Dr. Matilde Sprang on 05/15/2020 who presents today for follow-up PVR.  Today she reports improvement in urinary leakage since undergoing intravesical Botox.  She denies dysuria, gross hematuria, fever, chills, nausea, or vomiting.  She initially experienced some straining to urinate and bladder pain in the 7 to days following her treatment, however this spontaneously resolved.  PVR 0 mL today.    PMH: Past Medical History:  Diagnosis Date  . Allergy   . Anxiety   . Bursitis of both hips   . Depression   . Frequent headaches   . Obesity   . Sleep apnea    doesn't use CPAP machine broken,     Surgical History: Past Surgical History:  Procedure Laterality Date  . CESAREAN SECTION     x2  . CHOLECYSTECTOMY    . COLONOSCOPY WITH PROPOFOL N/A 11/20/2017   Procedure: COLONOSCOPY WITH PROPOFOL;  Surgeon: Lin Landsman, MD;  Location: [facility] ENDOSCOPY;  Service: Gastroenterology;  Laterality: N/A;  . HYSTERECTOMY ABDOMINAL WITH SALPINGECTOMY  1998  . HYSTEROSCOPY      Home Medications:  Allergies as of 06/06/2020      Reactions   Piper Other (See Comments)   Black pepper Feels like throat is closing, itchy Feels like throat is closing, itchy Feels like throat is closing, itchy   Tape Rash   Paper tape is ok to use.      Medication List       Accurate as of June 06, 2020  3:58 PM. If you have any questions, ask your nurse or doctor.        albuterol 108 (90 Base) MCG/ACT inhaler Commonly known as: ProAir HFA INHALE 2 PUFFS INTO LUNGS EVERY FOUR HOURS AS NEEDED FOR WHEEZING OR SHORTNESS OF BREATH   benztropine 0.5 MG tablet Commonly known as: COGENTIN Take by mouth.   Bydureon BCise 2 MG/0.85ML Auij Generic drug: Exenatide ER INJECT 2 MG  INTO THE SKIN ONCE A WEEK.   clobetasol ointment 0.05 % Commonly known as: TEMOVATE APPLY TO AFFECTED AREAS TWICE DAILY UNTIL IMPROVED   cloNIDine 0.3 MG tablet Commonly known as: CATAPRES Take 0.3 mg by mouth 2 (two) times daily.   estradiol 1 MG tablet Commonly known as: ESTRACE TAKE 1 TABLET (1 MG TOTAL) BY MOUTH DAILY.   fluticasone 50 MCG/ACT nasal spray Commonly known as: FLONASE SPRAY 1 SPRAY INTO EACH NOSTRIL ONCE DAILY.   gabapentin 100 MG capsule Commonly known as: NEURONTIN TAKE 1 CAPSULE BY MOUTH THREE TIMES A DAY   hydrOXYzine 10 MG tablet Commonly known as: ATARAX/VISTARIL Take 10 mg by mouth 3 (three) times daily.   linaclotide 145 MCG Caps capsule Commonly known as: LINZESS TAKE 1 CAPSULE (145 MCG TOTAL) BY MOUTH ONCE DAILY   lurasidone 80 MG Tabs tablet Commonly known as: LATUDA Take 80 mg by mouth.   meloxicam 15 MG tablet Commonly known as: MOBIC TAKE 1 TABLET (15 MG TOTAL) BY MOUTH DAILY AS NEEDED FOR PAIN.   nortriptyline 25 MG capsule Commonly known as: PAMELOR Take 25 mg by mouth 2 (two) times daily.   omeprazole 40 MG capsule Commonly known as: PRILOSEC TAKE 1 CAPSULE BY MOUTH TWICE A DAY 30 MINUTES BEFORE BREAKFAST AND DINNER   Oxcarbazepine 300 MG  tablet Commonly known as: TRILEPTAL Take 300 mg by mouth 3 (three) times daily.   pregabalin 100 MG capsule Commonly known as: LYRICA TAKE 1 CAPSULE BY MOUTH THREE TIMES A DAY   promethazine 25 MG tablet Commonly known as: PHENERGAN TAKE 1 TABLET BY MOUTH EVERY SIX HOURS AS NEEDED FOR NAUSEA VOMITING   QUEtiapine 100 MG tablet Commonly known as: SEROQUEL Take 100 mg by mouth at bedtime.   Spiriva HandiHaler 18 MCG inhalation capsule Generic drug: tiotropium PLACE 1 CAPSULE (18 MCG TOTAL) INTO INHALER AND INHALE DAILY.   Symbicort 160-4.5 MCG/ACT inhaler Generic drug: budesonide-formoterol INHALE 2 PUFFS INTO THE LUNGS IN THE MORNING AND AT BEDTIME.   tizanidine 2 MG  capsule Commonly known as: ZANAFLEX Take 1 capsule (2 mg total) by mouth 3 (three) times daily as needed for muscle spasms.   tiZANidine 2 MG tablet Commonly known as: ZANAFLEX Take 2 mg by mouth 3 (three) times daily.   traZODone 50 MG tablet Commonly known as: DESYREL Take 3 tabs at night, can take an extra tab in the middle of night as needed   valACYclovir 1000 MG tablet Commonly known as: VALTREX TAKE 1 TABLET BY MOUTH DAILY   vitamin B-12 500 MCG tablet Commonly known as: CYANOCOBALAMIN Take 500 mcg by mouth daily.   zolpidem 10 MG tablet Commonly known as: AMBIEN Take 1 tablet (10 mg total) by mouth at bedtime as needed for sleep.       Allergies:  Allergies  Allergen Reactions  . Piper Other (See Comments)    Black pepper Feels like throat is closing, itchy Feels like throat is closing, itchy Feels like throat is closing, itchy  . Tape Rash    Paper tape is ok to use.    Family History: Family History  Problem Relation Age of Onset  . Heart failure Maternal Grandmother   . Alzheimer's disease Maternal Grandfather   . Emphysema Paternal Grandmother   . Hodgkin's lymphoma Mother   . Diabetes Maternal Aunt   . Diabetes Maternal Uncle   . Cancer Other   . Breast cancer Neg Hx     Social History:   reports that she has been smoking cigarettes. She has a 30.00 pack-year smoking history. She uses smokeless tobacco. She reports current alcohol use. She reports current drug use. Drug: Marijuana.  Physical Exam: BP 135/81   Pulse 80   Ht '4\' 11"'$  (1.499 m)   Wt 130 lb (59 kg)   BMI 26.26 kg/m   Constitutional:  Alert and oriented, no acute distress, nontoxic appearing HEENT: [place], AT Cardiovascular: No clubbing, cyanosis, or edema Respiratory: Normal respiratory effort, no increased work of breathing Skin: No rashes, bruises or suspicious lesions Neurologic: Grossly intact, no focal deficits, moving all 4 extremities Psychiatric: Normal mood and  affect  Laboratory Data: Results for orders placed or performed in visit on 06/06/20  Bladder Scan (Post Void Residual) in office  Result Value Ref Range   Scan Result 58m    Assessment & Plan:   1. OAB (overactive bladder) Symptoms improved following intravesical Botox, PVR WNL today.  Unable to obtain a urine sample for UA, however patient is not experiencing infective symptoms and I am okay to defer this today.  We will plan for follow-up with Dr. MMatilde Sprangin 5 months to schedule her next treatment.  She expressed understanding. - Bladder Scan (Post Void Residual) in office  Return in about 5 months (around 11/06/2020) for Botox follow-up with Dr. MMatilde Sprang  Debroah Loop, PA-C  [facility] [address], [place], [place] [address] [phone]

## 2020-06-14 ENCOUNTER — Encounter: Payer: Self-pay | Admitting: Obstetrics & Gynecology

## 2020-06-14 ENCOUNTER — Other Ambulatory Visit (HOSPITAL_COMMUNITY)
Admission: RE | Admit: 2020-06-14 | Discharge: 2020-06-14 | Disposition: A | Discharge status: Home / Self Care | Payer: Medicaid Other | Source: Ambulatory Visit | Attending: Obstetrics & Gynecology | Admitting: Obstetrics & Gynecology

## 2020-06-14 ENCOUNTER — Other Ambulatory Visit: Payer: Self-pay

## 2020-06-14 ENCOUNTER — Ambulatory Visit (INDEPENDENT_AMBULATORY_CARE_PROVIDER_SITE_OTHER): Payer: Medicaid Other | Admitting: Obstetrics & Gynecology

## 2020-06-14 VITALS — BP 90/60 | Ht 59.0 in | Wt 134.0 lb

## 2020-06-14 DIAGNOSIS — M5442 Lumbago with sciatica, left side: Secondary | ICD-10-CM | POA: Diagnosis not present

## 2020-06-14 DIAGNOSIS — M5136 Other intervertebral disc degeneration, lumbar region: Secondary | ICD-10-CM | POA: Diagnosis not present

## 2020-06-14 DIAGNOSIS — Z01419 Encounter for gynecological examination (general) (routine) without abnormal findings: Secondary | ICD-10-CM

## 2020-06-14 DIAGNOSIS — Z1231 Encounter for screening mammogram for malignant neoplasm of breast: Secondary | ICD-10-CM

## 2020-06-14 DIAGNOSIS — Z124 Encounter for screening for malignant neoplasm of cervix: Secondary | ICD-10-CM

## 2020-06-14 DIAGNOSIS — M5441 Lumbago with sciatica, right side: Secondary | ICD-10-CM | POA: Diagnosis not present

## 2020-06-14 DIAGNOSIS — R232 Flushing: Secondary | ICD-10-CM

## 2020-06-14 DIAGNOSIS — M48061 Spinal stenosis, lumbar region without neurogenic claudication: Secondary | ICD-10-CM | POA: Diagnosis not present

## 2020-06-14 DIAGNOSIS — Z1211 Encounter for screening for malignant neoplasm of colon: Secondary | ICD-10-CM | POA: Diagnosis not present

## 2020-06-14 MED ORDER — ESTRADIOL 1 MG PO TABS: 1.0000 mg | ORAL_TABLET | Freq: Every day | ORAL | 11 refills | Status: DC

## 2020-06-14 MED ORDER — GABAPENTIN 100 MG PO CAPS: ORAL_CAPSULE | ORAL | 11 refills | Status: DC

## 2020-06-14 NOTE — Progress Notes (Signed)
HPI:      [name] is a 55 y.o. [id number] who LMP was in the past, she presents today for her annual examination.  The patient has no complaints today.  Improved hot flashes on ERT and Gabapentin.  The patient is sexually active. Herlast pap: approximate date 2019 and was normal and last mammogram: approximate date 2020 and was normal.  The patient does perform self breast exams.  There is no notable family history of breast or ovarian cancer in her family. The patient is taking hormone replacement therapy. Patient denies post-menopausal vaginal bleeding.   The patient has regular exercise: yes. The patient denies current symptoms of depression.    GYN Hx: Last Colonoscopy:3 years ago. Normal.   PMHx: Past Medical History:  Diagnosis Date  . Allergy   . Anxiety   . Bursitis of both hips   . Depression   . Frequent headaches   . Obesity   . Sleep apnea    doesn't use CPAP machine broken,    Past Surgical History:  Procedure Laterality Date  . CESAREAN SECTION     x2  . CHOLECYSTECTOMY    . COLONOSCOPY WITH PROPOFOL N/A 11/20/2017   Procedure: COLONOSCOPY WITH PROPOFOL;  Surgeon: Lin Landsman, MD;  Location: [facility] ENDOSCOPY;  Service: Gastroenterology;  Laterality: N/A;  . HYSTERECTOMY ABDOMINAL WITH SALPINGECTOMY  1998  . HYSTEROSCOPY     Family History  Problem Relation Age of Onset  . Heart failure Maternal Grandmother   . Alzheimer's disease Maternal Grandfather   . Emphysema Paternal Grandmother   . Hodgkin's lymphoma Mother   . Diabetes Maternal Aunt   . Diabetes Maternal Uncle   . Cancer Other   . Breast cancer Neg Hx    Social History   Tobacco Use  . Smoking status: Current Every Day Smoker    Packs/day: 1.00    Years: 30.00    Pack years: 30.00    Types: Cigarettes  . Smokeless tobacco: Current User  Vaping Use  . Vaping Use: Never used  Substance Use Topics  . Alcohol use: Yes    Comment: occ once per year  . Drug use: Yes    Types:  Marijuana    Comment: Current use    Current Outpatient Medications:  .  albuterol (PROAIR HFA) 108 (90 Base) MCG/ACT inhaler, INHALE 2 PUFFS INTO LUNGS EVERY FOUR HOURS AS NEEDED FOR WHEEZING OR SHORTNESS OF BREATH, Disp: 8.5 g, Rfl: 2 .  benztropine (COGENTIN) 0.5 MG tablet, Take by mouth., Disp: , Rfl:  .  BYDUREON BCISE 2 MG/0.85ML AUIJ, INJECT 2 MG INTO THE SKIN ONCE A WEEK., Disp: 3.4 mL, Rfl: 3 .  clobetasol ointment (TEMOVATE) 0.05 %, APPLY TO AFFECTED AREAS TWICE DAILY UNTIL IMPROVED, Disp: 60 g, Rfl: 2 .  cloNIDine (CATAPRES) 0.3 MG tablet, Take 0.3 mg by mouth 2 (two) times daily., Disp: , Rfl:  .  fluticasone (FLONASE) 50 MCG/ACT nasal spray, SPRAY 1 SPRAY INTO EACH NOSTRIL ONCE DAILY., Disp: 16 g, Rfl: 3 .  hydrOXYzine (ATARAX/VISTARIL) 10 MG tablet, Take 10 mg by mouth 3 (three) times daily., Disp: , Rfl:  .  linaclotide (LINZESS) 145 MCG CAPS capsule, TAKE 1 CAPSULE (145 MCG TOTAL) BY MOUTH ONCE DAILY, Disp: , Rfl:  .  lurasidone (LATUDA) 80 MG TABS tablet, Take 80 mg by mouth., Disp: , Rfl:  .  meloxicam (MOBIC) 15 MG tablet, TAKE 1 TABLET (15 MG TOTAL) BY MOUTH DAILY AS NEEDED FOR PAIN.,  Disp: 30 tablet, Rfl: 2 .  nortriptyline (PAMELOR) 25 MG capsule, Take 25 mg by mouth 2 (two) times daily., Disp: , Rfl:  .  omeprazole (PRILOSEC) 40 MG capsule, TAKE 1 CAPSULE BY MOUTH TWICE A DAY 30 MINUTES BEFORE BREAKFAST AND DINNER, Disp: 60 capsule, Rfl: 0 .  Oxcarbazepine (TRILEPTAL) 300 MG tablet, Take 300 mg by mouth 3 (three) times daily., Disp: , Rfl:  .  pregabalin (LYRICA) 100 MG capsule, TAKE 1 CAPSULE BY MOUTH THREE TIMES A DAY, Disp: , Rfl:  .  promethazine (PHENERGAN) 25 MG tablet, TAKE 1 TABLET BY MOUTH EVERY SIX HOURS AS NEEDED FOR NAUSEA VOMITING, Disp: 30 tablet, Rfl: 2 .  QUEtiapine (SEROQUEL) 100 MG tablet, Take 100 mg by mouth at bedtime., Disp: , Rfl:  .  SPIRIVA HANDIHALER 18 MCG inhalation capsule, PLACE 1 CAPSULE (18 MCG TOTAL) INTO INHALER AND INHALE DAILY., Disp: 30  capsule, Rfl: 3 .  SYMBICORT 160-4.5 MCG/ACT inhaler, INHALE 2 PUFFS INTO THE LUNGS IN THE MORNING AND AT BEDTIME., Disp: 10.2 g, Rfl: 2 .  tizanidine (ZANAFLEX) 2 MG capsule, Take 1 capsule (2 mg total) by mouth 3 (three) times daily as needed for muscle spasms., Disp: 60 capsule, Rfl: 2 .  tiZANidine (ZANAFLEX) 2 MG tablet, Take 2 mg by mouth 3 (three) times daily., Disp: , Rfl:  .  traZODone (DESYREL) 50 MG tablet, Take 3 tabs at night, can take an extra tab in the middle of night as needed, Disp: , Rfl:  .  valACYclovir (VALTREX) 1000 MG tablet, TAKE 1 TABLET BY MOUTH DAILY, Disp: 30 tablet, Rfl: 3 .  vitamin B-12 (CYANOCOBALAMIN) 500 MCG tablet, Take 500 mcg by mouth daily., Disp: , Rfl:  .  zolpidem (AMBIEN) 10 MG tablet, Take 1 tablet (10 mg total) by mouth at bedtime as needed for sleep., Disp: 30 tablet, Rfl: 5 .  estradiol (ESTRACE) 1 MG tablet, Take 1 tablet (1 mg total) by mouth daily., Disp: 30 tablet, Rfl: 11 .  gabapentin (NEURONTIN) 100 MG capsule, TAKE 1 CAPSULE BY MOUTH THREE TIMES A DAY, Disp: 90 capsule, Rfl: 11 Allergies: Piper and Tape  Review of Systems  Constitutional: Negative for chills, fever and malaise/fatigue.  HENT: Negative for congestion, sinus pain and sore throat.   Eyes: Negative for blurred vision and pain.  Respiratory: Negative for cough and wheezing.   Cardiovascular: Negative for chest pain and leg swelling.  Gastrointestinal: Negative for abdominal pain, constipation, diarrhea, heartburn, nausea and vomiting.  Genitourinary: Negative for dysuria, frequency, hematuria and urgency.  Musculoskeletal: Negative for back pain, joint pain, myalgias and neck pain.  Skin: Negative for itching and rash.  Neurological: Negative for dizziness, tremors and weakness.  Endo/Heme/Allergies: Does not bruise/bleed easily.  Psychiatric/Behavioral: Negative for depression. The patient is not nervous/anxious and does not have insomnia.     Objective: BP 90/60   Ht 4'  11" (1.499 m)   Wt 134 lb (60.8 kg)   BMI 27.06 kg/m   Filed Weights   06/14/20 1537  Weight: 134 lb (60.8 kg)   Body mass index is 27.06 kg/m. Physical Exam Constitutional:      General: She is not in acute distress.    Appearance: She is well-developed.  Genitourinary:     Rectum normal.     No lesions in the vagina.     No vaginal bleeding.      Right Adnexa: not tender and no mass present.    Left Adnexa: not tender and  no mass present.    No cervical motion tenderness, friability, lesion or polyp.     Uterus is not enlarged.     No uterine mass detected.    Uterus is anteverted.     Pelvic exam was performed with patient in the lithotomy position.  Breasts:     Right: No mass, skin change or tenderness.     Left: No mass, skin change or tenderness.    HENT:     Head: Normocephalic and atraumatic. No laceration.     Right Ear: Hearing normal.     Left Ear: Hearing normal.     Mouth/Throat:     Pharynx: Uvula midline.  Eyes:     Pupils: Pupils are equal, round, and reactive to light.  Neck:     Thyroid: No thyromegaly.  Cardiovascular:     Rate and Rhythm: Normal rate and regular rhythm.     Heart sounds: No murmur heard. No friction rub. No gallop.   Pulmonary:     Effort: Pulmonary effort is normal. No respiratory distress.     Breath sounds: Normal breath sounds. No wheezing.  Abdominal:     General: Bowel sounds are normal. There is no distension.     Palpations: Abdomen is soft.     Tenderness: There is no abdominal tenderness. There is no rebound.  Musculoskeletal:        General: Normal range of motion.     Cervical back: Normal range of motion and neck supple.  Neurological:     Mental Status: She is alert and oriented to person, place, and time.     Cranial Nerves: No cranial nerve deficit.  Skin:    General: Skin is warm and dry.  Psychiatric:        Judgment: Judgment normal.  Vitals reviewed.     Assessment: Annual Exam 1. Women's  annual routine gynecological examination   2. Encounter for screening mammogram for malignant neoplasm of breast   3. Screen for colon cancer   4. Screening for cervical cancer   5. Vasomotor flushing     Plan:            1.  Cervical Screening-  Pap smear done today  2. Breast screening- Exam annually and mammogram scheduled  3. Colonoscopy every 10 years, Hemoccult testing after age 32  4. Labs managed by PCP  5. Counseling for hormonal therapy: no change in therapy today Cont ERT and Gabapentin for now as it controls sx's well and she desires to continue              6. FRAX - FRAX score for assessing the 10 year probability for fracture calculated and discussed today.  Based on age and score today, DEXA is not currently scheduled.    F/U  Return in about 1 year (around 06/14/2021) for Annual.  Barnett Applebaum, MD, [facility], [facility] 06/14/2020  4:00 PM

## 2020-06-14 NOTE — Patient Instructions (Signed)
PAP every three years Mammogram every year    Call [phone] to schedule at [facility] Colonoscopy every 10 years Labs yearly (with PCP)  Thank you for choosing [facility]. As part of our ongoing efforts to improve patient experience, we would appreciate your feedback. Please fill out the short survey that you will receive by mail or MyChart. Your opinion is important to [place]! - Dr. Kenton Kingfisher

## 2020-06-15 ENCOUNTER — Other Ambulatory Visit: Payer: Self-pay | Admitting: *Deleted

## 2020-06-15 NOTE — Patient Outreach (Signed)
Care Coordination  06/15/2020  ROBENA MALONEY 1965-05-07 [id number]    Medicaid Managed Care   Unsuccessful Outreach Note  06/15/2020 Name: SIDONIA KNAKE MRN: [id number] DOB: 1965/12/21  Referred by: Olin Hauser, DO Reason for referral : High Risk Managed Medicaid (Unsuccessful RNCM outreach x 2)   A second unsuccessful telephone outreach was attempted today. The patient was referred to the case management team for assistance with care management and care coordination.   Follow Up Plan: The care management team will reach out to the patient again over the next 7-14 days.   [name] RN, BSN [place]  [employer] manager

## 2020-06-15 NOTE — Patient Instructions (Signed)
Visit Information  [name]  - as a part of your Medicaid benefit, you are eligible for care management and care coordination services at no cost or copay. I was unable to reach you by phone today but would be happy to help you with your health related needs. Please feel free to call me @ [phone].   A member of the Managed Medicaid care management team will reach out to you again over the next 7-14 days.   [name] RN, BSN [place]  [employer] manager

## 2020-06-20 ENCOUNTER — Other Ambulatory Visit: Payer: Self-pay | Admitting: Family Medicine

## 2020-06-20 ENCOUNTER — Other Ambulatory Visit: Payer: Self-pay | Admitting: Obstetrics & Gynecology

## 2020-06-20 DIAGNOSIS — J0110 Acute frontal sinusitis, unspecified: Secondary | ICD-10-CM

## 2020-06-20 DIAGNOSIS — R200 Anesthesia of skin: Secondary | ICD-10-CM | POA: Diagnosis not present

## 2020-06-20 DIAGNOSIS — R202 Paresthesia of skin: Secondary | ICD-10-CM | POA: Diagnosis not present

## 2020-06-20 DIAGNOSIS — J3089 Other allergic rhinitis: Secondary | ICD-10-CM

## 2020-06-20 DIAGNOSIS — K219 Gastro-esophageal reflux disease without esophagitis: Secondary | ICD-10-CM

## 2020-06-20 DIAGNOSIS — J410 Simple chronic bronchitis: Secondary | ICD-10-CM

## 2020-06-20 DIAGNOSIS — M8949 Other hypertrophic osteoarthropathy, multiple sites: Secondary | ICD-10-CM

## 2020-06-20 DIAGNOSIS — M159 Polyosteoarthritis, unspecified: Secondary | ICD-10-CM

## 2020-06-20 LAB — CYTOLOGY - PAP
Adequacy: ABSENT
Comment: NEGATIVE
High risk HPV: POSITIVE — AB

## 2020-06-20 NOTE — Telephone Encounter (Signed)
Notes to clinic:  Patient has a upcoming appointment on 4/15/202    Requested Prescriptions  Pending Prescriptions Disp Refills   fluticasone (FLONASE) 50 MCG/ACT nasal spray [Pharmacy Med Name: Fluticasone Propionate 50MCG/ACT SUSP] 16 g 3    Sig: SPRAY 1 SPRAY INTO EACH NOSTRIL ONCE DAILY.      Ear, Nose, and Throat: Nasal Preparations - Corticosteroids Passed - 06/20/2020  3:20 PM      Passed - Valid encounter within last 12 months    Recent Outpatient Visits           2 months ago Psychophysiological insomnia   [place], DO   6 months ago Neuropathy of left upper extremity   [place], DO   7 months ago Hypotension due to drugs   [place], DO   9 months ago Unintentional weight loss   [place], DO   1 year ago Controlled type 2 diabetes mellitus with complication, without long-term current use of insulin ([place])   [place], DO       Future Appointments             In 3 days [place] Ranger, Devonne Doughty, DO [facility], [place]   In 4 months Bjorn Loser, MD [facility]               omeprazole ([place]) 40 MG capsule [Pharmacy Med Name: Omeprazole '40MG'$  CPDR] 60 capsule 0    Sig: TAKE 1 CAPSULE BY MOUTH TWICE A DAY [place]      Gastroenterology: Proton Pump Inhibitors Passed - 06/20/2020  3:20 PM      Passed - Valid encounter within last 12 months    Recent Outpatient Visits           2 months ago Psychophysiological insomnia   [place], DO   6 months ago Neuropathy of left upper extremity   [place], DO   7 months ago Hypotension due to drugs   [place], DO   9 months ago Unintentional weight loss   [place], DO   1 year ago Controlled type 2 diabetes mellitus with complication, without long-term current use of insulin ([place])   [place], Devonne Doughty, DO       Future Appointments             In 3 days Parks Ranger, Devonne Doughty, [facility], [place]   In 4 months Bjorn Loser, MD [place] 18 MCG inhalation capsule [Pharmacy Med Name: Spiriva HandiHaler 18MCG CAPS] 30 capsule 3    Sig: PLACE 1 CAPSULE (18 MCG TOTAL) INTO INHALER AND INHALE DAILY.      Pulmonology:  Anticholinergic Agents Passed - 06/20/2020  3:20 PM      Passed - Valid encounter within last 12 months    Recent Outpatient Visits           2 months ago Psychophysiological insomnia   [place], DO   6 months ago Neuropathy of left upper extremity   [facility] [place], [place] Coil  J, DO   7 months ago Hypotension due to drugs   [place], DO   9 months ago Unintentional weight loss   [place], DO   1 year ago Controlled type 2 diabetes mellitus with complication, without long-term current use of insulin ([place])   [facility] Parks Ranger, Devonne Doughty, DO       Future Appointments             In 3 days Parks Ranger, Devonne Doughty, DO [facility], [place]   In 4 months Bjorn Loser, MD [facility]               tiZANidine (ZANAFLEX) 2 MG tablet [Pharmacy Med Name: tiZANidine HCl '2MG'$  TABS*] 60 tablet     Sig: TAKE 1 TABLET THREE TIMES A DAY AS NEEDED MUSCLE SPASM      Not Delegated - Cardiovascular:  Alpha-2 Agonists - tizanidine Failed - 06/20/2020  3:20 PM      Failed - This refill cannot be delegated       Passed - Valid encounter within last 6 months    Recent Outpatient Visits           2 months ago Psychophysiological insomnia   [place], DO   6 months ago Neuropathy of left upper extremity   [place], DO   7 months ago Hypotension due to drugs   [place], DO   9 months ago Unintentional weight loss   [place], Devonne Doughty, DO   1 year ago Controlled type 2 diabetes mellitus with complication, without long-term current use of insulin ([place])   [facility] Parks Ranger, Devonne Doughty, DO       Future Appointments             In 3 days Parks Ranger, Devonne Doughty, DO [facility], [place]   In 4 months Mac[place]armid, Nicki Reaper, MD [facility]               valACYclovir ([place]) 1000 MG tablet [Pharmacy Med Name: valACYclovir HCl 1GM TABS*] 30 tablet 3    Sig: TAKE 1 TABLET BY MOUTH DAILY      Antimicrobials:  Antiviral Agents - Anti-Herpetic Passed - 06/20/2020  3:20 PM      Passed - Valid encounter within last 12 months    Recent Outpatient Visits           2 months ago Psychophysiological insomnia   [place], DO   6 months ago Neuropathy of left upper extremity   [place], DO   7 months ago Hypotension due to drugs   [place], DO   9 months ago Unintentional weight loss   [place], DO   1 year ago Controlled type 2 diabetes mellitus with complication, without long-term current use of insulin ([place])   [place], Devonne Doughty, DO       Future Appointments             In 3 days Parks Ranger, Devonne Doughty, DO [facility], [place]   In 4 months [place], Nicki Reaper,  [facility]

## 2020-06-20 NOTE — Progress Notes (Signed)
Pt aware of LGSIL PAP (prior [place]) Need for colposcopy Cryo ve LEEP vs Trachelectomy if future cervical dyslasia concerns (pt expresses preference for trachelectomy if it comes to that)  Barnett Applebaum, MD, [facility], [facility] 06/20/2020  1:04 PM

## 2020-06-23 ENCOUNTER — Ambulatory Visit: Payer: Medicaid Other | Admitting: Family Medicine

## 2020-06-28 ENCOUNTER — Ambulatory Visit: Payer: Medicaid Other | Admitting: Family Medicine

## 2020-06-29 ENCOUNTER — Other Ambulatory Visit: Payer: Self-pay

## 2020-06-29 ENCOUNTER — Ambulatory Visit: Payer: Medicaid Other | Admitting: Family Medicine

## 2020-06-29 ENCOUNTER — Encounter: Payer: Self-pay | Admitting: Family Medicine

## 2020-06-29 ENCOUNTER — Other Ambulatory Visit: Payer: Self-pay | Admitting: Family Medicine

## 2020-06-29 VITALS — BP 135/83 | HR 90 | Temp 97.1°F | Ht 59.0 in | Wt 136.6 lb

## 2020-06-29 DIAGNOSIS — M4692 Unspecified inflammatory spondylopathy, cervical region: Secondary | ICD-10-CM

## 2020-06-29 DIAGNOSIS — R2689 Other abnormalities of gait and mobility: Secondary | ICD-10-CM | POA: Insufficient documentation

## 2020-06-29 DIAGNOSIS — E118 Type 2 diabetes mellitus with unspecified complications: Secondary | ICD-10-CM | POA: Diagnosis not present

## 2020-06-29 DIAGNOSIS — G894 Chronic pain syndrome: Secondary | ICD-10-CM | POA: Diagnosis not present

## 2020-06-29 DIAGNOSIS — M8949 Other hypertrophic osteoarthropathy, multiple sites: Secondary | ICD-10-CM

## 2020-06-29 DIAGNOSIS — M159 Polyosteoarthritis, unspecified: Secondary | ICD-10-CM

## 2020-06-29 DIAGNOSIS — Z1211 Encounter for screening for malignant neoplasm of colon: Secondary | ICD-10-CM | POA: Diagnosis not present

## 2020-06-29 DIAGNOSIS — J432 Centrilobular emphysema: Secondary | ICD-10-CM

## 2020-06-29 DIAGNOSIS — M5412 Radiculopathy, cervical region: Secondary | ICD-10-CM

## 2020-06-29 LAB — POCT GLYCOSYLATED HEMOGLOBIN (HGB A1C): Hemoglobin A1C: 5.2 % (ref 4.0–5.6)

## 2020-06-29 NOTE — Assessment & Plan Note (Signed)
Chronic Centrilobular Emphysema COPD Active smoker, goals to quit Continue current therapy, Spiriva

## 2020-06-29 NOTE — Progress Notes (Signed)
Subjective:    Patient ID: Darlene Barber, female    DOB: 1965-08-27, 55 y.o.   MRN: [id number]  Darlene Barber is a 55 y.o. female presenting on 06/29/2020 for Insomnia, Diabetes, and Back Pain   HPI   Bipolar Mood Disorder Insomnia Followed by [facility] Psychiatry Followed by [facility] Neurology Dr Melrose Nakayama  Last visit 06/20/20 Taken off [place]. Increase gabapentin to '200mg'$  three times a day - Increase Seroquel to '150mg'$  nightly - some benefit with sleeping, still only sleeping 2-3 hours at a time without waking up. - Continue Lyrica '100mg'$  three times a day. - Continue taking nortriptyilne 10 mg in the morning and 50 mg at night.  - Discussed rebound headache and recommended patient continue to avoid frequent use of OTC medications. - Continue meloxicam 7.'5mg'$  twice daily.   No further dizziness, lightheadedness. Balance problem she describes being off balance, with ambulation at times. Has not discussed with Neurology. She cannot do PT  Chronic Pain [employer] - She has received back and hip injections recently -s/p left L4-5 TFESI 03/15/2020 with several days relief -s/p left L4-5 TFESI 04/26/2020 with 50 to 60% relief -s/p right L4-5 TF ESI 05/16/2020 with no relief Upcoming apt with Pain Management.   Depression screen [facility] 2/9 03/28/2020 12/01/2018 08/21/2018  Decreased Interest 1 0 0  Down, Depressed, Hopeless 1 1 0  PHQ - 2 Score 2 1 0  Altered sleeping '3 3 2  '$ Tired, decreased energy '3 3 2  '$ Change in appetite '1 1 3  '$ Feeling bad or failure about yourself  0 0 0  Trouble concentrating 0 0 0  Moving slowly or fidgety/restless 0 0 0  Suicidal thoughts 0 0 0  PHQ-9 Score '9 8 7  '$ Difficult doing work/chores Somewhat difficult Somewhat difficult Not difficult at all    Social History   Tobacco Use  . Smoking status: Current Every Day Smoker    Packs/day: 1.00    Years: 30.00    Pack years: 30.00    Types: Cigarettes  . Smokeless tobacco: Current User  Vaping Use   . Vaping Use: Never used  Substance Use Topics  . Alcohol use: Yes    Comment: occ once per year  . Drug use: Yes    Types: Marijuana    Comment: Current use    Review of Systems Per HPI unless specifically indicated above     Objective:    BP 135/83 (BP Location: Left Arm, Patient Position: Sitting, Cuff Size: Normal)   Pulse 90   Temp (!) 97.1 F (36.2 C) (Temporal)   Ht '4\' 11"'$  (1.499 m)   Wt 136 lb 9.6 oz (62 kg)   SpO2 100%   BMI 27.59 kg/m   Wt Readings from Last 3 Encounters:  06/29/20 136 lb 9.6 oz (62 kg)  06/14/20 134 lb (60.8 kg)  06/06/20 130 lb (59 kg)    Physical Exam Vitals and nursing note reviewed.  Constitutional:      General: She is not in acute distress.    Appearance: She is well-developed. She is not diaphoretic.     Comments: Well-appearing, comfortable, cooperative  HENT:     Head: Normocephalic and atraumatic.  Eyes:     General:        Right eye: No discharge.        Left eye: No discharge.     Conjunctiva/sclera: Conjunctivae normal.  Neck:     Thyroid: No thyromegaly.  Cardiovascular:  Rate and Rhythm: Normal rate and regular rhythm.     Heart sounds: Normal heart sounds. No murmur heard.   Pulmonary:     Effort: Pulmonary effort is normal. No respiratory distress.     Breath sounds: Normal breath sounds. No wheezing or rales.  Musculoskeletal:        General: Normal range of motion.     Cervical back: Normal range of motion and neck supple.     Right lower leg: No edema.     Left lower leg: No edema.  Lymphadenopathy:     Cervical: No cervical adenopathy.  Skin:    General: Skin is warm and dry.     Findings: No erythema or rash.  Neurological:     Mental Status: She is alert and oriented to person, place, and time.  Psychiatric:        Behavior: Behavior normal.     Comments: Well groomed, good eye contact, normal speech and thoughts      Diabetic Foot Exam - Simple   Simple Foot Form Diabetic Foot exam was  performed with the following findings: Yes 06/29/2020 10:09 AM  Visual Inspection See comments: Yes Sensation Testing Intact to touch and monofilament testing bilaterally: Yes Pulse Check Posterior Tibialis and Dorsalis pulse intact bilaterally: Yes Comments Bilateral callus formation dry skin.    Recent Labs    06/29/20 0959  HGBA1C 5.2     Results for orders placed or performed in visit on 06/29/20  POCT HgB A1C  Result Value Ref Range   Hemoglobin A1C 5.2 4.0 - 5.6 %      Assessment & Plan:   Problem List Items Addressed This Visit    Controlled type 2 diabetes mellitus with complication, without long-term current use of insulin (HCC) - Primary   Relevant Orders   POCT HgB A1C (Completed)   Chronic pain syndrome (Chronic)   Cervical spondylitis w/ radiculitis (HCC) (Chronic)   Centrilobular emphysema (HCC)    Chronic Centrilobular Emphysema COPD Active smoker, goals to quit Continue current therapy, [facility]      Balance disorder      #Chronic Pain Syndrome Neck C spine DDD, radiculitis Back/Hip Followed by [facility] Limited success with medications and injection Awaiting referral to [facility] Management Clinic  #Type 2 Diabetes Controlled A1c today 5.2 DM Foot exam done Controlled on Bydureon BCise.  #Balance Disorder Not related to hypotension Possible related to medications, as discussed she should review with Neurology. They can assist with further balance therapy Gave her handout on home balance exercises  #Memory Consistent with forgetfulness and mild memory issue May try OTC Prevagen trial Continue cognitive stimulation / puzzles etc to help May be med side effect  No orders of the defined types were placed in this encounter.     Follow up plan: Return in about 4 months (around 10/29/2020) for 4 month follow-up updates pain, mood/insomnia, balance.   [name], [facility] 06/29/2020, 9:48 AM

## 2020-06-29 NOTE — Patient Instructions (Addendum)
Thank you for coming to the office today.  Balance therapy, try to do home exercises  [email or web address]://[email or web address].com/[email or web address]-[email or web address]-home-[email or web address]-exercises  Prevagen OTC memory supplement  Memory consider doing memory games and recall testing, word puzzles to help stimulate memory and keep up cognition.  Reviewed med list.   Please schedule a Follow-up Appointment to: Return in about 4 months (around 10/29/2020) for 4 month follow-up updates pain, mood/insomnia, balance.  If you have any other questions or concerns, please feel free to call the office or send a message through [place]. You may also schedule an earlier appointment if necessary.  Additionally, you may be receiving a survey about your experience at our office within a few days to 1 week by e-mail or mail. We value your feedback.  [name], DO [place]

## 2020-07-01 LAB — SPECIMEN STATUS REPORT

## 2020-07-01 LAB — FECAL OCCULT BLOOD, IMMUNOCHEMICAL: Fecal Occult Bld: NEGATIVE

## 2020-07-03 ENCOUNTER — Telehealth: Payer: Self-pay | Admitting: Family Medicine

## 2020-07-03 NOTE — Telephone Encounter (Signed)
I attempted to reach Darlene Barber today to get her rescheduled for her phone visit with the MM RNCM. I left my name and number on her voicemail for her to call me back.

## 2020-07-05 ENCOUNTER — Encounter: Payer: Self-pay | Admitting: Obstetrics & Gynecology

## 2020-07-05 ENCOUNTER — Other Ambulatory Visit (HOSPITAL_COMMUNITY)
Admission: RE | Admit: 2020-07-05 | Discharge: 2020-07-05 | Disposition: A | Discharge status: Home / Self Care | Payer: Medicaid Other | Source: Ambulatory Visit | Attending: Obstetrics & Gynecology | Admitting: Obstetrics & Gynecology

## 2020-07-05 ENCOUNTER — Other Ambulatory Visit: Payer: Self-pay

## 2020-07-05 ENCOUNTER — Ambulatory Visit (INDEPENDENT_AMBULATORY_CARE_PROVIDER_SITE_OTHER): Payer: Medicaid Other | Admitting: Obstetrics & Gynecology

## 2020-07-05 VITALS — BP 120/80 | Ht 59.0 in | Wt 141.0 lb

## 2020-07-05 DIAGNOSIS — R87612 Low grade squamous intraepithelial lesion on cytologic smear of cervix (LGSIL): Secondary | ICD-10-CM | POA: Diagnosis not present

## 2020-07-05 MED ORDER — GABAPENTIN 300 MG PO CAPS: 300.0000 mg | ORAL_CAPSULE | Freq: Two times a day (BID) | ORAL | 10 refills | Status: DC

## 2020-07-05 NOTE — Patient Instructions (Signed)
https://www.acog.org/Patients/FAQs/[email or web address]">  Colposcopy, Care After This sheet gives you information about how to care for yourself after your procedure. Your health care provider may also give you more specific instructions. If you have problems or questions, contact your health care provider. What can I expect after the procedure? If you had a colposcopy without a biopsy, you can expect to feel fine right away after your procedure. However, you may have some spotting of blood for a few days. You can return to your normal activities. If you had a colposcopy with a biopsy, it is common after the procedure to have:  Soreness and mild pain. These may last for a few days.  Light-headedness.  Mild vaginal bleeding or discharge that is dark-colored and grainy. This may last for a few days. The discharge may be caused by a liquid (solution) that was used during the procedure. You may need to wear a sanitary pad during this time.  Spotting of blood for at least 48 hours after the procedure. Follow these instructions at home: Medicines  Take over-the-counter and prescription medicines only as told by your health care provider.  Talk with your health care provider about what type of over-the-counter pain medicine and prescription medicine you can start to take again. It is especially important to talk with your health care provider if you take blood thinners. Activity  Limit your physical activity for the first day after your procedure as told by your health care provider.  Avoid using douche products, using tampons, or having sex for at least 3 days after the procedure or for as long as told.  Return to your normal activities as told by your health care provider. Ask your health care provider what activities are safe for you. General instructions  Drink enough fluid to keep your urine pale yellow.  Ask your health care provider if you may take baths, swim, or use a hot tub. You may take  showers.  If you use birth control (contraception), continue to use it.  Keep all follow-up visits as told by your health care provider. This is important.   Contact a health care provider if:  You develop a skin rash. Get help right away if:  You bleed a lot from your vagina or pass blood clots. This includes using more than one sanitary pad each hour for 2 hours in a row.  You have a fever or chills.  You have vaginal discharge that is abnormal, is yellow in color, or smells bad. This could be a sign of infection.  You have severe pain or cramps in your lower abdomen that do not go away with medicine.  You faint. Summary  If you had a colposcopy without a biopsy, you can expect to feel fine right away, but you may have some spotting of blood for a few days. You can return to your normal activities.  If you had a colposcopy with a biopsy, it is common to have mild pain for a few days and spotting for 48 hours after the procedure.  Avoid using douche products, using tampons, and having sex for at least 3 days after the procedure or for as long as told by your health care provider.  Get help right away if you have heavy bleeding, severe pain, or signs of infection. This information is not intended to replace advice given to you by your health care provider. Make sure you discuss any questions you have with your health care provider. Document Revised: 02/24/2019 Document Reviewed:   02/24/2019 Elsevier Patient Education  2021 Elsevier Inc.  

## 2020-07-05 NOTE — Progress Notes (Signed)
HPI:  Darlene Barber is a 55 y.o.  [id number]  who presents today for evaluation and management of abnormal cervical cytology.    Dysplasia History:  LGSIL of cervix (prior LSH) Last PAP normal 2019 Cryo to cervix for dysplasia many decades ago  ROS:  Pertinent items are noted in HPI.  OB History  Gravida Para Term Preterm AB Living  '5 2 2   2 3  '$ SAB IAB Ectopic Multiple Live Births  '1 1   1      '$ # Outcome Date GA Lbr Len/2nd Weight Sex Delivery Anes PTL Lv  5 Gravida           4 IAB           3 SAB           2 Term           1 Term             Past Medical History:  Diagnosis Date  . Allergy   . Anxiety   . Bursitis of both hips   . Depression   . Frequent headaches   . Obesity   . Sleep apnea    doesn't use CPAP machine broken,     Past Surgical History:  Procedure Laterality Date  . CESAREAN SECTION     x2  . CHOLECYSTECTOMY    . COLONOSCOPY WITH PROPOFOL N/A 11/20/2017   Procedure: COLONOSCOPY WITH PROPOFOL;  Surgeon: Lin Landsman, MD;  Location[facility] ENDOSCOPY;  Service: Gastroenterology;  Laterality: N/A;  . HYSTERECTOMY ABDOMINAL WITH SALPINGECTOMY  1998  . HYSTEROSCOPY      SOCIAL HISTORY: Social History   Substance and Sexual Activity  Alcohol Use Yes   Comment: occ once per year   Social History   Substance and Sexual Activity  Drug Use Yes  . Types: Marijuana   Comment: Current use     Family History  Problem Relation Age of Onset  . Heart failure Maternal Grandmother   . Alzheimer's disease Maternal Grandfather   . Emphysema Paternal Grandmother   . Hodgkin's lymphoma Mother   . Diabetes Maternal Aunt   . Diabetes Maternal Uncle   . Cancer Other   . Breast cancer Neg Hx     ALLERGIES:  Pi[place] and Tape  Current Outpatient Medications on File Prior to Visit  Medication Sig Dispense Refill  . albuterol (PROAIR HFA) 108 (90 Base) MCG/ACT inhaler INHALE 2 PUFFS INTO LUNGS EVERY FOUR HOURS AS NEEDED FOR WHEEZING OR SHORTNESS  OF BREATH 8.5 g 2  . BYDUREON BCISE 2 MG/0.85ML AUIJ INJECT 2 MG INTO THE SKIN ONCE A WEEK. 3.4 mL 3  . clobetasol ointment (TEMOVATE) 0.05 % APPLY TO AFFECTED AREAS TWICE DAILY UNTIL IMPROVED 60 g 2  . cloNIDine (CATAPRES) 0.3 MG tablet Take 0.3 mg by mouth 2 (two) times daily.    [name] estradiol (ESTRACE) 1 MG tablet Take 1 tablet (1 mg total) by mouth daily. 30 tablet 11  . fluticasone (FLONASE) 50 MCG/ACT nasal spray SPRAY 1 SPRAY INTO EACH NOSTRIL ONCE DAILY. 16 g 3  . hydrOXYzine (ATARAX/VISTARIL) 10 MG tablet Take 10 mg by mouth 3 (three) times daily.    [name] linaclotide (LINZESS) 145 MCG CAPS capsule TAKE 1 CAPSULE (145 MCG TOTAL) BY MOUTH ONCE DAILY    . meloxicam (MOBIC) 15 MG tablet TAKE 1 TABLET (15 MG TOTAL) BY MOUTH DAILY AS NEEDED FOR PAIN. 30 tablet 2  . omeprazole (PRILOSEC) 40  MG capsule TAKE 1 CAPSULE BY MOUTH TWICE A DAY 30 MINUTES BEFORE BREAKFAST AND DINNER 60 capsule 0  . Oxcarbazepine (TRILEPTAL) 300 MG tablet Take 300 mg by mouth 3 (three) times daily.    . pregabalin (LYRICA) 100 MG capsule TAKE 1 CAPSULE BY MOUTH THREE TIMES A DAY    . promethazine (PHENERGAN) 25 MG tablet TAKE 1 TABLET BY MOUTH EVERY SIX HOURS AS NEEDED FOR NAUSEA VOMITING 30 tablet 2  . QUEtiapine (SEROQUEL) 100 MG tablet Take 100 mg by mouth at bedtime.    [name] SPIRIVA HANDIHALER 18 MCG inhalation capsule PLACE 1 CAPSULE (18 MCG TOTAL) INTO INHALER AND INHALE DAILY. 30 capsule 5  . SYMBICORT 160-4.5 MCG/ACT inhaler INHALE 2 PUFFS INTO THE LUNGS IN THE MORNING AND AT BEDTIME. 10.2 g 2  . tiZANidine (ZANAFLEX) 2 MG tablet TAKE 1 TABLET THREE TIMES A DAY AS NEEDED MUSCLE SPASM 60 tablet 5  . valACYclovir (VALTREX) 1000 MG tablet TAKE 1 TABLET BY MOUTH DAILY 30 tablet 5  . vitamin B-12 (CYANOCOBALAMIN) 500 MCG tablet Take 500 mcg by mouth daily.     No current facility-administered medications on file prior to visit.    Physical Exam: -Vitals:  BP 120/80   Ht '4\' 11"'$  (1.499 m)   Wt 141 lb (64 kg)   BMI  28.48 kg/m  GEN: WD, WN, NAD.  A+ O x 3, good mood and affect. ABD:  NT, ND.  Soft, no masses.  No hernias noted.   Pelvic:   Vulva: Normal appearance.  No lesions.  Vagina: No lesions or abnormalities noted.  Support: Normal pelvic support.  Urethra No masses tenderness or scarring.  Meatus Normal size without lesions or prolapse.  Cervix: See below.  Anus: Normal exam.  No lesions.  Perineum: Normal exam.  No lesions.        Bimanual   Uterus: Normal size.  Non-tender.  Mobile.  AV.  Adnexae: No masses.  Non-tender to palpation.  Cul-de-sac: Negative for abnormality.   PROCEDURE: 1.  Urine Pregnancy Test:  not done 2.  Colposcopy performed with 4% acetic acid after verbal consent obtained                                         -Aceto-white Lesions Location(s): none    Also, closed cervical os.              -Biopsy performed at 6, 12 o'clock               -ECC indicated and performed: No.     -Biopsy sites made hemostatic with pressure, AgNO3, and/or Monsel's solution   -Satisfactory colposcopy: Yes.      -Evidence of Invasive cervical CA :  NO  ASSESSMENT:  Darlene Barber is a 55 y.o. [id number] here for  1. LGSIL on Pap smear of cervix   .  PLAN: 1.  I discussed the grading system of pap smears and HPV high risk viral types.  We will discuss and base management after colpo results return. 2. Follow up PAP 6 months, vs intervention if high grade dysplasia identified Trachelectomy if CIN 2 or 3  Gabapentin dose adjusted due to worsening hot flashes (worked for a while at low dose)     Barnett Applebaum, MD, [facility], [facility] 07/05/2020  3:14 PM

## 2020-07-07 ENCOUNTER — Telehealth: Payer: Self-pay | Admitting: Family Medicine

## 2020-07-07 ENCOUNTER — Telehealth: Payer: Self-pay

## 2020-07-07 LAB — SURGICAL PATHOLOGY

## 2020-07-07 NOTE — Telephone Encounter (Signed)
Attempted to reach [place] today to get her rescheduled for a phone visit with the MM RNCM. I left my name and number for her to return the call.

## 2020-07-07 NOTE — Telephone Encounter (Signed)
Patient is scheduled for repeat pap on 12/13/20 with [facility]

## 2020-07-07 NOTE — Progress Notes (Signed)
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

## 2020-07-14 IMAGING — MG MM DIGITAL SCREENING BILAT W/ TOMO W/ CAD
8 series · 8 of 24 positions shown · non-contrast
Comparison: Previous exam(s).

CLINICAL DATA: Screening.

EXAM:
DIGITAL SCREENING BILATERAL MAMMOGRAM WITH TOMO AND CAD

[R MLO synth-2D]
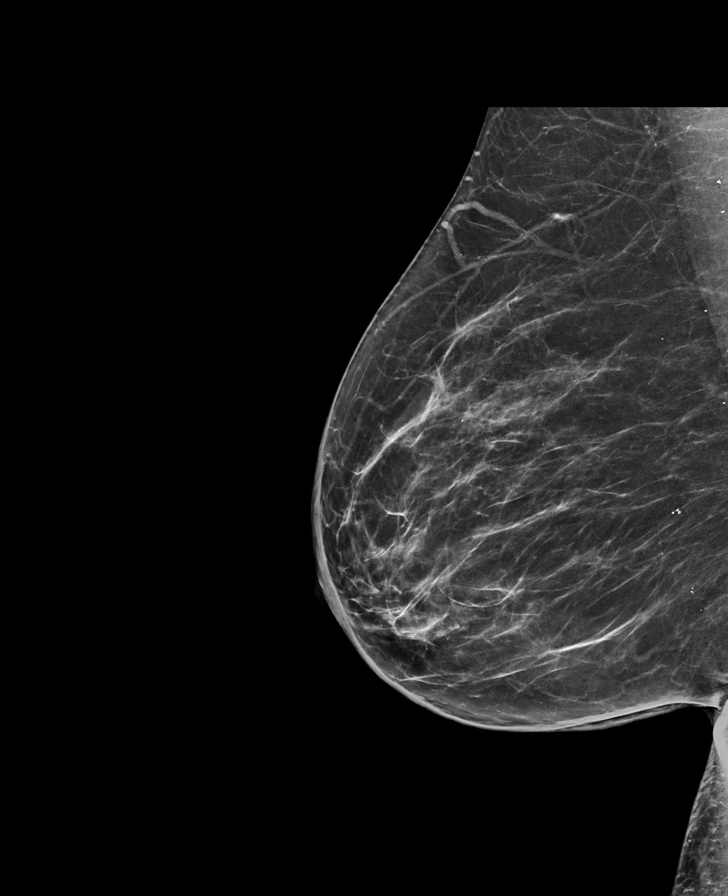

[L MLO synth-2D]
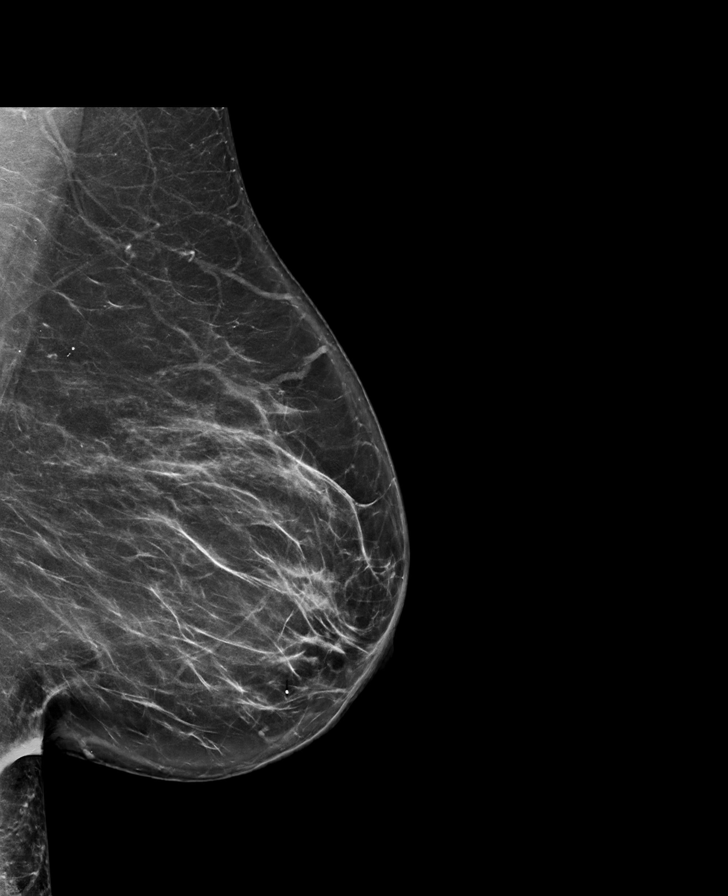

[L CC synth-2D]
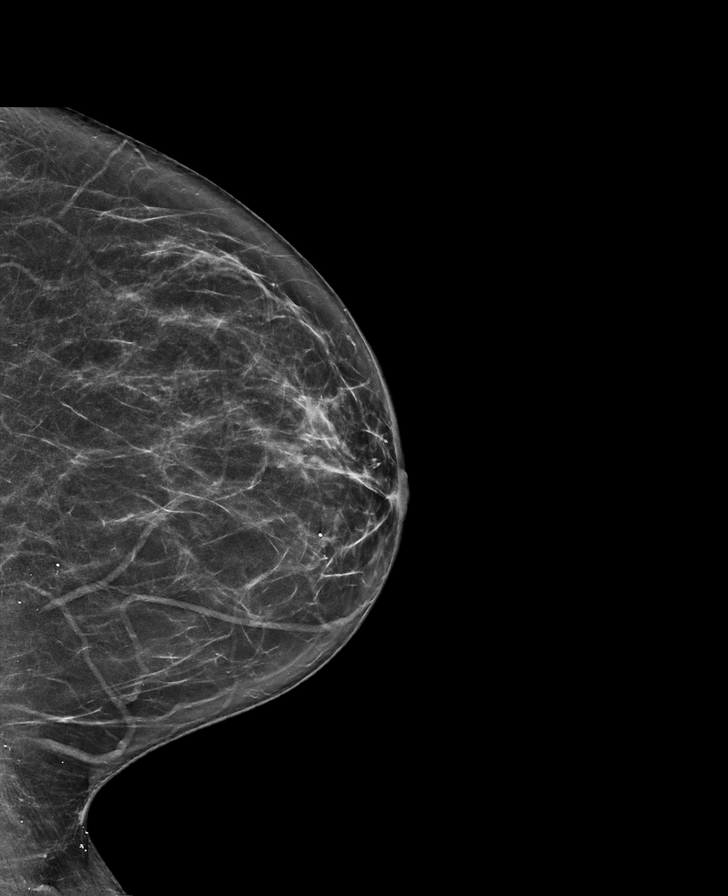

[R CC synth-2D]
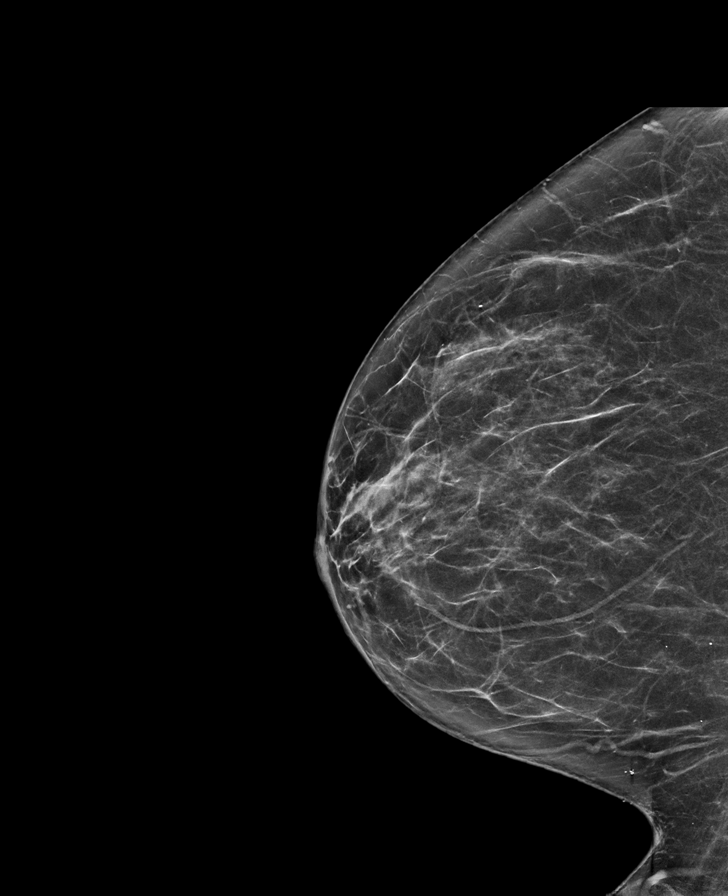

[L MLO tomo · tomo slice 40/79.0]
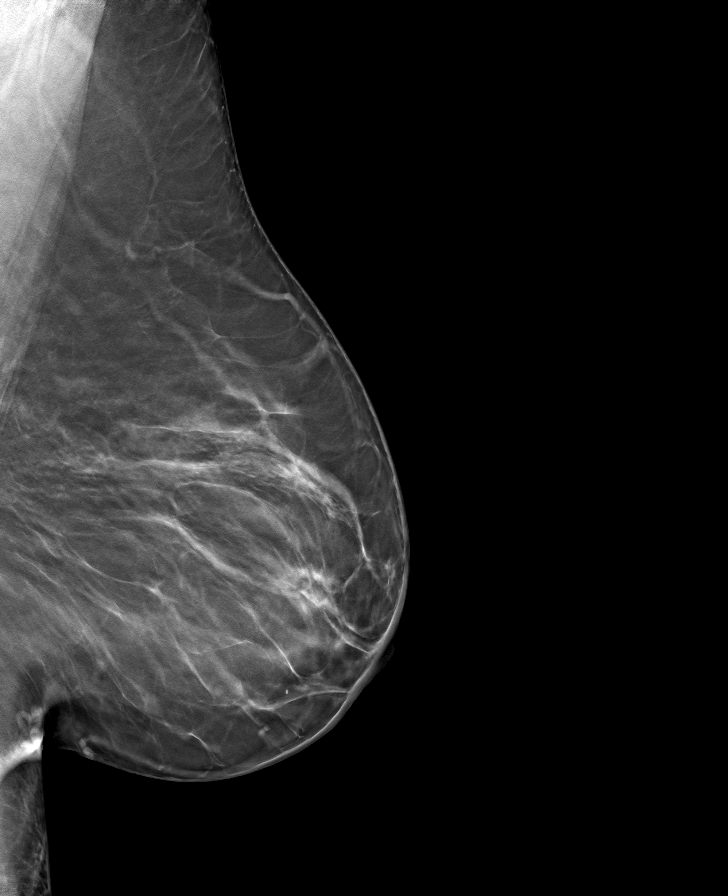

[R CC tomo · tomo slice 35/68.0]
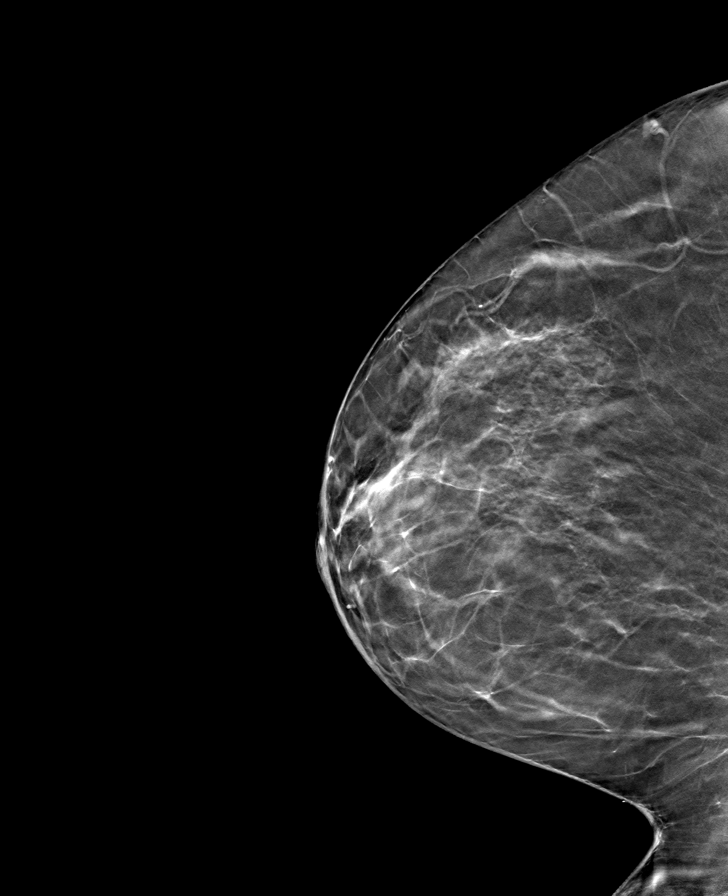

[L CC tomo · tomo slice 34/67.0]
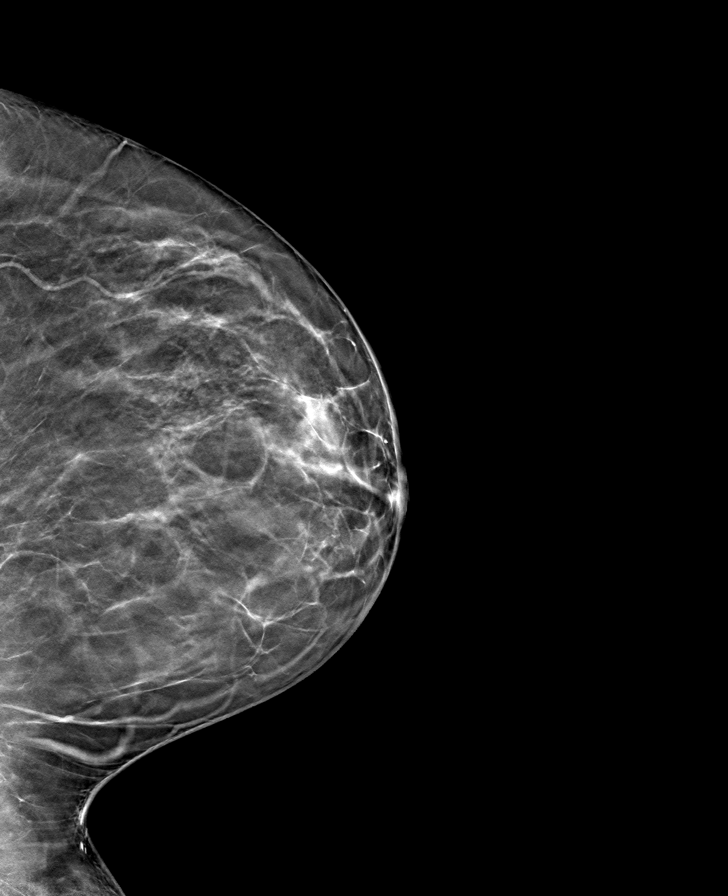

[R MLO tomo · tomo slice 37/73.0]
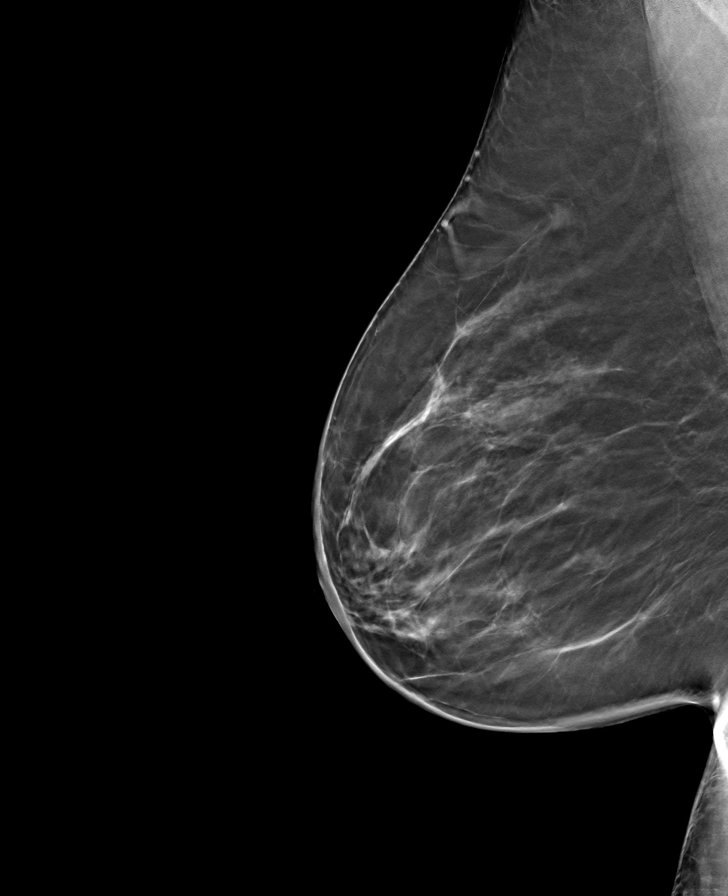

[8 of 24 positions shown; findings below may reference images not displayed]

ACR Breast Density Category c: The breast tissue is heterogeneously
dense, which may obscure small masses.
FINDINGS: There are no findings suspicious for malignancy. Images were
processed with CAD.
IMPRESSION: No mammographic evidence of malignancy. A result letter of this
screening mammogram will be mailed directly to the patient.

RECOMMENDATION:
Screening mammogram in one year. (Code:[id number])

BI-RADS CATEGORY  1: Negative.

## 2020-07-21 ENCOUNTER — Other Ambulatory Visit: Payer: Self-pay | Admitting: Obstetrics & Gynecology

## 2020-07-21 ENCOUNTER — Other Ambulatory Visit: Payer: Self-pay | Admitting: Family Medicine

## 2020-07-21 DIAGNOSIS — K219 Gastro-esophageal reflux disease without esophagitis: Secondary | ICD-10-CM

## 2020-07-21 DIAGNOSIS — J432 Centrilobular emphysema: Secondary | ICD-10-CM

## 2020-07-24 ENCOUNTER — Other Ambulatory Visit: Payer: Self-pay | Admitting: Obstetrics & Gynecology

## 2020-07-24 ENCOUNTER — Other Ambulatory Visit: Payer: Self-pay | Admitting: Family Medicine

## 2020-07-24 DIAGNOSIS — J410 Simple chronic bronchitis: Secondary | ICD-10-CM

## 2020-07-24 NOTE — Telephone Encounter (Signed)
Requested medication (s) are due for refill today: to pharmacy: maximum refills reached   Requested medication (s) are on the active medication list: yes  Last refill:  proair- 04/24/20 #8.5 g 2 refills , phenergan 03/28/20 #30 2 refills   Future visit scheduled: yes in 3 months   Notes to clinic:  not delegated per protocol     Requested Prescriptions  Pending Prescriptions Disp Refills   PROAIR HFA 108 (90 Base) MCG/ACT inhaler [Pharmacy Med Name: ProAir HFA 108 (90 Base)MCG/ACT AERS] 8.5 g 2    Sig: INHALE 2 PUFFS INTO LUNGS EVERY FOUR HOURS AS NEEDED FOR WHEEZING OR SHORTNESS OF BREATH      Pulmonology:  Beta Agonists Failed - 07/24/2020  4:39 PM      Failed - One inhaler should last at least one month. If the patient is requesting refills earlier, contact the patient to check for uncontrolled symptoms.      Passed - Valid encounter within last 12 months    Recent Outpatient Visits           3 weeks ago Controlled type 2 diabetes mellitus with complication, without long-term current use of insulin ([place])   [place], DO   3 months ago Psychophysiological insomnia   [place], DO   7 months ago Neuropathy of left upper extremity   [place], DO   9 months ago Hypotension due to drugs   [place], DO   10 months ago Unintentional weight loss   [place], DO       Future Appointments             In 3 months [place], Devonne Doughty, [facility], [place]   In 3 months Bjorn Loser, MD [facility]               promethazine (PHENERGAN) 25 MG tablet [Pharmacy Med Name: Promethazine HCl '25MG'$  TABS] 30 tablet 2    Sig: TAKE 1 TABLET BY MOUTH EVERY SIX HOURS AS NEEDED FOR NAUSEA VOMITING      Not Delegated -  Gastroenterology: Antiemetics Failed - 07/24/2020  4:39 PM      Failed - This refill cannot be delegated      Passed - Valid encounter within last 6 months    Recent Outpatient Visits           3 weeks ago Controlled type 2 diabetes mellitus with complication, without long-term current use of insulin [facility])   [place], DO   3 months ago Psychophysiological insomnia   [place], DO   7 months ago Neuropathy of left upper extremity   [place], DO   9 months ago Hypotension due to drugs   [place], DO   10 months ago Unintentional weight loss   [place], DO       Future Appointments             In 3 months [place] Ranger, Devonne Doughty, [facility], [place]   In 3 months [place], Nicki Reaper, [facility]

## 2020-08-01 DIAGNOSIS — G4733 Obstructive sleep apnea (adult) (pediatric): Secondary | ICD-10-CM | POA: Diagnosis not present

## 2020-08-01 DIAGNOSIS — R200 Anesthesia of skin: Secondary | ICD-10-CM | POA: Diagnosis not present

## 2020-08-01 DIAGNOSIS — R251 Tremor, unspecified: Secondary | ICD-10-CM | POA: Diagnosis not present

## 2020-08-01 DIAGNOSIS — R519 Headache, unspecified: Secondary | ICD-10-CM | POA: Diagnosis not present

## 2020-08-11 ENCOUNTER — Telehealth: Payer: Self-pay

## 2020-08-11 ENCOUNTER — Other Ambulatory Visit: Payer: Self-pay | Admitting: Physician Assistant

## 2020-08-11 DIAGNOSIS — R413 Other amnesia: Secondary | ICD-10-CM

## 2020-08-11 NOTE — Telephone Encounter (Signed)
for CRM: Patient called in and stated she wanted someone to come to her home to help clip her toe nails. Pt stated she has brought this up to [employer] before. Pt request a someone to reach back out to her.    Dr. Raliegh Ip is going to place a referral to podiatry. Patient is aware

## 2020-08-17 ENCOUNTER — Ambulatory Visit: Admission: RE | Admit: 2020-08-17 | Payer: Medicaid Other | Source: Ambulatory Visit

## 2020-08-22 ENCOUNTER — Other Ambulatory Visit: Payer: Self-pay | Admitting: Family Medicine

## 2020-08-22 DIAGNOSIS — L602 Onychogryphosis: Secondary | ICD-10-CM

## 2020-08-22 DIAGNOSIS — K219 Gastro-esophageal reflux disease without esophagitis: Secondary | ICD-10-CM

## 2020-09-08 ENCOUNTER — Ambulatory Visit: Admission: RE | Admit: 2020-09-08 | Payer: Medicaid Other | Source: Ambulatory Visit

## 2020-09-21 ENCOUNTER — Other Ambulatory Visit: Payer: Self-pay | Admitting: Family Medicine

## 2020-09-21 DIAGNOSIS — M8949 Other hypertrophic osteoarthropathy, multiple sites: Secondary | ICD-10-CM

## 2020-09-21 DIAGNOSIS — M159 Polyosteoarthritis, unspecified: Secondary | ICD-10-CM

## 2020-09-21 DIAGNOSIS — K219 Gastro-esophageal reflux disease without esophagitis: Secondary | ICD-10-CM

## 2020-09-21 NOTE — Telephone Encounter (Signed)
Requested medication (s) are due for refill today:  Yes  Requested medication (s) are on the active medication list:   Yes  Future visit scheduled:   Yes   Last ordered: 05/24/2020 #30, 2 refills  Returned because maximum refills have been reached per pharmacy   Requested Prescriptions  Pending Prescriptions Disp Refills   meloxicam (MOBIC) 15 MG tablet [Pharmacy Med Name: Meloxicam '15MG'$  TABS] 30 tablet 2    Sig: TAKE 1 TABLET BY MOUTH DAILY AS NEEDED FOR PAIN      Analgesics:  COX2 Inhibitors Passed - 09/21/2020 10:16 AM      Passed - HGB in normal range and within 360 days    Hemoglobin  Date Value Ref Range Status  09/30/2019 14.7 11.7 - 15.5 g/dL Final   HGB  Date Value Ref Range Status  04/20/2012 16.7 (H) 12.0 - 16.0 g/dL Final          Passed - Cr in normal range and within 360 days    Creat  Date Value Ref Range Status  09/30/2019 0.83 0.50 - 1.05 mg/dL Final    Comment:    For patients >80 years of age, the reference limit for Creatinine is approximately 13% higher for people identified as African-American. Renella Cunas - Patient is not pregnant      Passed - Valid encounter within last 12 months    Recent Outpatient Visits           2 months ago Controlled type 2 diabetes mellitus with complication, without long-term current use of insulin ([place])   [place], DO   5 months ago Psychophysiological insomnia   [place], DO   9 months ago Neuropathy of left upper extremity   [place], DO   11 months ago Hypotension due to drugs   [place], DO   1 year ago Unintentional weight loss   [place], Devonne Doughty, DO       Future Appointments             In 1 month [place], Nicki Reaper, MD [place]   In 1 month [place] Ranger,  Devonne Doughty, [facility], [facility]

## 2020-09-26 ENCOUNTER — Other Ambulatory Visit: Payer: Self-pay | Admitting: Family Medicine

## 2020-09-26 NOTE — Telephone Encounter (Signed)
Requested medication (s) are due for refill today- unsure  Requested medication (s) are on the active medication list -yes  Future visit scheduled -yes  Last refill: 07/25/20 #30 2 RF  Notes to clinic: Request RF- non delegated Rx  Requested Prescriptions  Pending Prescriptions Disp Refills   promethazine (PHENERGAN) 25 MG tablet [Pharmacy Med Name: Promethazine HCl '25MG'$  TABS] 30 tablet 2    Sig: TAKE 1 TABLET BY MOUTH EVERY SIX HOURS AS NEEDED FOR NAUSEA VOMITING      Not Delegated - Gastroenterology: Antiemetics Failed - 09/26/2020  3:00 PM      Failed - This refill cannot be delegated      Passed - Valid encounter within last 6 months    Recent Outpatient Visits           2 months ago Controlled type 2 diabetes mellitus with complication, without long-term current use of insulin ([place])   [facility], Devonne Doughty, DO   6 months ago Psychophysiological insomnia   [facility] Olin Hauser, DO   9 months ago Neuropathy of left upper extremity   [place], DO   11 months ago Hypotension due to drugs   [place], Devonne Doughty, DO   1 year ago Unintentional weight loss   [place], Devonne Doughty, DO       Future Appointments             In 1 month MacDiarmid, Nicki Reaper, MD [place]   In 1 month [place], Devonne Doughty, DO [facility], [facility]                 Requested Prescriptions  Pending Prescriptions Disp Refills   promethazine (PHENERGAN) 25 MG tablet [Pharmacy Med Name: Promethazine HCl '25MG'$  TABS] 30 tablet 2    Sig: TAKE 1 TABLET BY MOUTH EVERY SIX HOURS AS NEEDED FOR NAUSEA VOMITING      Not Delegated - Gastroenterology: Antiemetics Failed - 09/26/2020  3:00 PM      Failed - This refill cannot be delegated      Passed - Valid encounter within last 6 months    Recent  Outpatient Visits           2 months ago Controlled type 2 diabetes mellitus with complication, without long-term current use of insulin [facility])   [place], DO   6 months ago Psychophysiological insomnia   [place], DO   9 months ago Neuropathy of left upper extremity   [place], DO   11 months ago Hypotension due to drugs   [place], DO   1 year ago Unintentional weight loss   [place], Devonne Doughty, DO       Future Appointments             In 1 month MacDiarmid, Nicki Reaper, MD [place]   In 1 month [place], Devonne Doughty, [facility], [facility]

## 2020-10-23 ENCOUNTER — Other Ambulatory Visit: Payer: Self-pay | Admitting: Family Medicine

## 2020-10-23 DIAGNOSIS — J432 Centrilobular emphysema: Secondary | ICD-10-CM

## 2020-10-23 DIAGNOSIS — J0110 Acute frontal sinusitis, unspecified: Secondary | ICD-10-CM

## 2020-10-23 DIAGNOSIS — K219 Gastro-esophageal reflux disease without esophagitis: Secondary | ICD-10-CM

## 2020-10-23 DIAGNOSIS — J3089 Other allergic rhinitis: Secondary | ICD-10-CM

## 2020-10-23 DIAGNOSIS — J410 Simple chronic bronchitis: Secondary | ICD-10-CM

## 2020-10-26 DIAGNOSIS — M79674 Pain in right toe(s): Secondary | ICD-10-CM | POA: Diagnosis not present

## 2020-10-26 DIAGNOSIS — L600 Ingrowing nail: Secondary | ICD-10-CM | POA: Diagnosis not present

## 2020-10-26 DIAGNOSIS — B351 Tinea unguium: Secondary | ICD-10-CM | POA: Diagnosis not present

## 2020-10-26 DIAGNOSIS — M79675 Pain in left toe(s): Secondary | ICD-10-CM | POA: Diagnosis not present

## 2020-10-26 DIAGNOSIS — E119 Type 2 diabetes mellitus without complications: Secondary | ICD-10-CM | POA: Diagnosis not present

## 2020-10-31 ENCOUNTER — Ambulatory Visit: Payer: Medicaid Other | Admitting: Family Medicine

## 2020-11-06 ENCOUNTER — Other Ambulatory Visit: Payer: Self-pay

## 2020-11-06 ENCOUNTER — Ambulatory Visit (INDEPENDENT_AMBULATORY_CARE_PROVIDER_SITE_OTHER): Payer: Medicaid Other | Admitting: Urology

## 2020-11-06 VITALS — BP 154/90 | HR 91

## 2020-11-06 DIAGNOSIS — N3946 Mixed incontinence: Secondary | ICD-10-CM | POA: Diagnosis not present

## 2020-11-06 LAB — URINALYSIS, COMPLETE
Bilirubin, UA: NEGATIVE
Glucose, UA: NEGATIVE
Ketones, UA: NEGATIVE
Leukocytes,UA: NEGATIVE
Nitrite, UA: NEGATIVE
Protein,UA: NEGATIVE
RBC, UA: NEGATIVE
Specific Gravity, UA: 1.01 (ref 1.005–1.030)
Urobilinogen, Ur: 0.2 mg/dL (ref 0.2–1.0)
pH, UA: 5.5 (ref 5.0–7.5)

## 2020-11-06 LAB — MICROSCOPIC EXAMINATION
Bacteria, UA: NONE SEEN
Epithelial Cells (non renal): NONE SEEN /hpf (ref 0–10)

## 2020-11-06 MED ORDER — CIPROFLOXACIN HCL 250 MG PO TABS: 250.0000 mg | ORAL_TABLET | Freq: Two times a day (BID) | ORAL | 0 refills | Status: DC

## 2020-11-06 NOTE — Addendum Note (Signed)
Addended by: Maryln Gottron on: 11/06/2020 02:38 PM   Modules accepted: Orders

## 2020-11-06 NOTE — Addendum Note (Signed)
Addended by: Verlene Mayer A on: 11/06/2020 02:36 PM   Modules accepted: Orders

## 2020-11-06 NOTE — Progress Notes (Signed)
11/06/2020 2:02 PM   Darlene Barber [birth year] [id number]  Referring provider: Olin Hauser, DO [address] [place],  [place] [address]  No chief complaint on file.   HPI: Reviewed last note.  Her last Botox was 10 per 2020.  She has urge incontinence that is refractory to treatment with very little warning.  They can be high-volume.  She has some stress incontinence with bronchitis.  She does very well with Botox.  Frequency stable.  Incontinence stable.  Clinically not infected    Today Last Botox was May 15, 2020.  She was doing well postoperatively and was emptying well Patient started to leak a little bit even with coughing sneezing and the Botox usually helps this.  Clinically not infected.  Frequency stable   PMH: Past Medical History:  Diagnosis Date   Allergy    Anxiety    Bursitis of both hips    Depression    Frequent headaches    Obesity    Sleep apnea    doesn't use CPAP machine broken,     Surgical History: Past Surgical History:  Procedure Laterality Date   CESAREAN SECTION     x2   CHOLECYSTECTOMY     COLONOSCOPY WITH PROPOFOL N/A 11/20/2017   Procedure: COLONOSCOPY WITH PROPOFOL;  Surgeon: Lin Landsman, MD;  Location: [facility] ENDOSCOPY;  Service: Gastroenterology;  Laterality: N/A;   HYSTERECTOMY ABDOMINAL WITH SALPINGECTOMY  1998   HYSTEROSCOPY      Home Medications:  Allergies as of 11/06/2020       Reactions   [place] Other (See Comments)   Black pepper Feels like throat is closing, itchy Feels like throat is closing, itchy Feels like throat is closing, itchy   Tape Rash   Paper tape is ok to use.        Medication List        Accurate as of November 06, 2020  2:02 PM. If you have any questions, ask your nurse or doctor.          albuterol 108 (90 Base) MCG/ACT inhaler Commonly known as: ProAir HFA INHALE 2 PUFFS INTO LUNGS EVERY FOUR HOURS AS NEEDED FOR WHEEZING OR SHORTNESS OF BREATH   Bydureon BCise 2 MG/0.85ML  Auij Generic drug: Exenatide ER INJECT 2 MG INTO THE SKIN ONCE A WEEK.   clobetasol ointment 0.05 % Commonly known as: TEMOVATE APPLY TO AFFECTED AREAS TWICE DAILY UNTIL IMPROVED   cloNIDine 0.3 MG tablet Commonly known as: CATAPRES Take 0.3 mg by mouth 2 (two) times daily.   estradiol 1 MG tablet Commonly known as: ESTRACE Take 1 tablet (1 mg total) by mouth daily.   fluticasone 50 MCG/ACT nasal spray Commonly known as: FLONASE SPRAY 1 SPRAY INTO EACH NOSTRIL ONCE DAILY.   gabapentin 300 MG capsule Commonly known as: NEURONTIN Take 1 capsule (300 mg total) by mouth 2 (two) times daily.   hydrOXYzine 10 MG tablet Commonly known as: ATARAX/VISTARIL Take 10 mg by mouth 3 (three) times daily.   linaclotide 145 MCG Caps capsule Commonly known as: LINZESS TAKE 1 CAPSULE (145 MCG TOTAL) BY MOUTH ONCE DAILY   meloxicam 15 MG tablet Commonly known as: MOBIC TAKE 1 TABLET BY MOUTH DAILY AS NEEDED FOR PAIN   omeprazole 40 MG capsule Commonly known as: PRILOSEC TAKE 1 CAPSULE BY MOUTH TWICE A DAY 30 MINUTES BEFORE BREAKFAST AND DINNER   Oxcarbazepine 300 MG tablet Commonly known as: TRILEPTAL Take 300 mg by mouth 3 (three) times  daily.   pregabalin 100 MG capsule Commonly known as: LYRICA TAKE 1 CAPSULE BY MOUTH THREE TIMES A DAY   promethazine 25 MG tablet Commonly known as: PHENERGAN TAKE 1 TABLET BY MOUTH EVERY SIX HOURS AS NEEDED FOR NAUSEA VOMITING   QUEtiapine 100 MG tablet Commonly known as: SEROQUEL Take 100 mg by mouth at bedtime.   Spiriva HandiHaler 18 MCG inhalation capsule Generic drug: tiotropium PLACE 1 CAPSULE (18 MCG TOTAL) INTO INHALER AND INHALE DAILY.   Symbicort 160-4.5 MCG/ACT inhaler Generic drug: budesonide-formoterol INHALE 2 PUFFS INTO THE LUNGS IN THE MORNING AND AT BEDTIME.   tiZANidine 2 MG tablet Commonly known as: ZANAFLEX TAKE 1 TABLET THREE TIMES A DAY AS NEEDED MUSCLE SPASM   valACYclovir 1000 MG tablet Commonly known as:  VALTREX TAKE 1 TABLET BY MOUTH DAILY   vitamin B-12 500 MCG tablet Commonly known as: CYANOCOBALAMIN Take 500 mcg by mouth daily.        Allergies:  Allergies  Allergen Reactions   [place] Other (See Comments)    Black pepper Feels like throat is closing, itchy Feels like throat is closing, itchy Feels like throat is closing, itchy   Tape Rash    Paper tape is ok to use.    Family History: Family History  Problem Relation Age of Onset   Heart failure Maternal Grandmother    Alzheimer's disease Maternal Grandfather    Emphysema Paternal Grandmother    Hodgkin's lymphoma Mother    Diabetes Maternal Aunt    Diabetes Maternal Uncle    Cancer Other    Breast cancer Neg Hx     Social History:  reports that she has been smoking cigarettes. She has a 30.00 pack-year smoking history. She uses smokeless tobacco. She reports current alcohol use. She reports current drug use. Drug: Marijuana.  ROS:                                        Physical Exam: There were no vitals taken for this visit.  Constitutional:  Alert and oriented, No acute distress. HEENT: [place] AT, moist mucus membranes.  Trachea midline, no masses.   Laboratory Data: Lab Results  Component Value Date   WBC 5.8 09/30/2019   HGB 14.7 09/30/2019   HCT 43.8 09/30/2019   MCV 99.5 09/30/2019   PLT 245 09/30/2019    Lab Results  Component Value Date   CREATININE 0.83 09/30/2019    No results found for: PSA  No results found for: TESTOSTERONE  Lab Results  Component Value Date   HGBA1C 5.2 06/29/2020    Urinalysis    Component Value Date/Time   COLORURINE Yellow 04/20/2012 1725   APPEARANCEUR Clear 05/15/2020 1144   LABSPEC 1.010 04/20/2012 1725   PHURINE 6.0 04/20/2012 1725   GLUCOSEU Negative 05/15/2020 1144   GLUCOSEU Negative 04/20/2012 1725   HGBUR Negative 04/20/2012 1725   BILIRUBINUR Negative 05/15/2020 1144   BILIRUBINUR Negative 04/20/2012 1725   KETONESUR  Negative 04/20/2012 1725   PROTEINUR Negative 05/15/2020 1144   PROTEINUR Negative 04/20/2012 1725   NITRITE Negative 05/15/2020 1144   NITRITE Negative 04/20/2012 1725   LEUKOCYTESUR Negative 05/15/2020 1144   LEUKOCYTESUR Negative 04/20/2012 1725    Pertinent Imaging:   Assessment & Plan: Schedule patient for Botox with usual protocol.  3-day prescription of ciprofloxacin given  There are no diagnoses linked to this encounter.  No follow-ups  on file.  Reece Packer, MD  [place] [address], [place], [place] [address] [phone]

## 2020-11-08 ENCOUNTER — Other Ambulatory Visit: Payer: Self-pay

## 2020-11-08 ENCOUNTER — Ambulatory Visit: Payer: Medicaid Other | Admitting: Family Medicine

## 2020-11-08 ENCOUNTER — Encounter: Payer: Self-pay | Admitting: Family Medicine

## 2020-11-08 VITALS — BP 158/84 | HR 96 | Ht 59.0 in | Wt 152.2 lb

## 2020-11-08 DIAGNOSIS — F319 Bipolar disorder, unspecified: Secondary | ICD-10-CM | POA: Diagnosis not present

## 2020-11-08 DIAGNOSIS — M8949 Other hypertrophic osteoarthropathy, multiple sites: Secondary | ICD-10-CM | POA: Diagnosis not present

## 2020-11-08 DIAGNOSIS — F5104 Psychophysiologic insomnia: Secondary | ICD-10-CM | POA: Diagnosis not present

## 2020-11-08 DIAGNOSIS — K219 Gastro-esophageal reflux disease without esophagitis: Secondary | ICD-10-CM

## 2020-11-08 DIAGNOSIS — M159 Polyosteoarthritis, unspecified: Secondary | ICD-10-CM

## 2020-11-08 DIAGNOSIS — G894 Chronic pain syndrome: Secondary | ICD-10-CM

## 2020-11-08 LAB — CULTURE, URINE COMPREHENSIVE

## 2020-11-08 MED ORDER — OMEPRAZOLE 40 MG PO CPDR: 40.0000 mg | DELAYED_RELEASE_CAPSULE | Freq: Two times a day (BID) | ORAL | 3 refills | Status: DC

## 2020-11-08 MED ORDER — DICLOFENAC SODIUM 1 % EX GEL: 2.0000 g | Freq: Four times a day (QID) | CUTANEOUS | 3 refills | Status: DC | PRN

## 2020-11-08 NOTE — Patient Instructions (Addendum)
Thank you for coming to the office today.  START anti inflammatory topical - OTC Voltaren (generic Diclofenac) topical 2-4 times a day as needed for pain swelling of affected joint for 1-2 weeks or longer.  Recommend Aveeno or Eucerin daily moisturizer multiple times a time day after washing / sanitizing.  Use the topical Clobetasol ointment nightly for 7-10 days,  if still not improving would consider Dermatology.  We can authorize any refills when ready.   Please schedule a Follow-up Appointment to: Return in about 4 months (around 03/10/2021) for 4 month follow-up HTN (restart med?) DM A1c, / Mood / Pain / Skin/Eczema.  If you have any other questions or concerns, please feel free to call the office or send a message through [place]. You may also schedule an earlier appointment if necessary.  Additionally, you may be receiving a survey about your experience at our office within a few days to 1 week by e-mail or mail. We value your feedback.  [name], DO [place]

## 2020-11-08 NOTE — Progress Notes (Signed)
Subjective:    Patient ID: Darlene Barber, female    DOB: [birth year], 55 y.o.   MRN: [id number]  KYI AREY is a 55 y.o. female presenting on 11/08/2020 for Back Pain, Hip Pain, Dizziness, and Shaking   HPI  Bipolar Mood Disorder Insomnia Followed by [facility] Followed by [facility] Neurology Dr Melrose Nakayama   Reviewed med list and updated Off Latuda On Seroquel, Oxcarbazepine On higher dose Gabapentin for hot flashes. - Continue Lyrica '100mg'$  three times a day. - Continue taking nortriptyilne 10 mg in the morning and 50 mg at night.  - Continue meloxicam '15mg'$  daily.    Balance problem she describes being off balance, with ambulation at times. Has not discussed with Neurology. She cannot do PT  Vasomotor Hot Flashes, post menopausal Improved on higher Gabapentin '300mg'$  TID now, instead of BID. Still has night sweats.   GERD Controlled on Omeprazole '40mg'$  BID but insurance says may not cover anymore  [facility] - She has received back and hip injections recently -s/p left L4-5 TFESI 03/15/2020 with several days relief -s/p left L4-5 TFESI 04/26/2020 with 50 to 60% relief -s/p right L4-5 TF ESI 05/16/2020 with no relief Upcoming apt with Pain Management.  Had followed by Dr Alba Destine for injections They wanted to refer her to Pain Clinic at [facility]. However did not follow through with this.  Admits occasional elevated BP Remains off BP medication asking about these in future.   Depression screen [facility] 2/9 03/28/2020 12/01/2018 08/21/2018  Decreased Interest 1 0 0  Down, Depressed, Hopeless 1 1 0  PHQ - 2 Score 2 1 0  Altered sleeping '3 3 2  '$ Tired, decreased energy '3 3 2  '$ Change in appetite '1 1 3  '$ Feeling bad or failure about yourself  0 0 0  Trouble concentrating 0 0 0  Moving slowly or fidgety/restless 0 0 0  Suicidal thoughts 0 0 0  PHQ-9 Score '9 8 7  '$ Difficult doing work/chores Somewhat difficult Somewhat difficult Not difficult  at all    Social History   Tobacco Use   Smoking status: Every Day    Packs/day: 1.00    Years: 30.00    Pack years: 30.00    Types: Cigarettes   Smokeless tobacco: Current  Vaping Use   Vaping Use: Never used  Substance Use Topics   Alcohol use: Yes    Comment: occ once per year   Drug use: Yes    Types: Marijuana    Comment: Current use    Review of Systems Per HPI unless specifically indicated above     Objective:    BP (!) 158/84   Pulse 96   Ht '4\' 11"'$  (1.499 m)   Wt 152 lb 3.2 oz (69 kg)   SpO2 95%   BMI 30.74 kg/m   Wt Readings from Last 3 Encounters:  11/08/20 152 lb 3.2 oz (69 kg)  07/05/20 141 lb (64 kg)  06/29/20 136 lb 9.6 oz (62 kg)    Physical Exam Vitals and nursing note reviewed.  Constitutional:      General: She is not in acute distress.    Appearance: Normal appearance. She is well-developed. She is not diaphoretic.     Comments: Well-appearing, comfortable, cooperative  HENT:     Head: Normocephalic and atraumatic.  Eyes:     General:        Right eye: No discharge.        Left  eye: No discharge.     Conjunctiva/sclera: Conjunctivae normal.  Cardiovascular:     Rate and Rhythm: Normal rate.  Pulmonary:     Effort: Pulmonary effort is normal.  Skin:    General: Skin is warm and dry.     Findings: No erythema or rash.  Neurological:     Mental Status: She is alert and oriented to person, place, and time.  Psychiatric:        Mood and Affect: Mood normal.        Behavior: Behavior normal.        Thought Content: Thought content normal.     Comments: Well groomed, good eye contact, normal speech and thoughts     Results for orders placed or performed in visit on 11/06/20  CULTURE, URINE COMPREHENSIVE   Specimen: Urine   UR  Result Value Ref Range   Urine Culture, Comprehensive Final report    Organism ID, Bacteria Comment   Microscopic Examination   Urine  Result Value Ref Range   WBC, UA 0-5 0 - 5 /hpf   RBC 0-2 0 - 2  /hpf   Epithelial Cells (non renal) None seen 0 - 10 /hpf   Bacteria, UA None seen None seen/Few  Urinalysis, Complete  Result Value Ref Range   Specific Gravity, UA 1.010 1.005 - 1.030   pH, UA 5.5 5.0 - 7.5   Color, UA Yellow Yellow   Appearance Ur Clear Clear   Leukocytes,UA Negative Negative   Protein,UA Negative Negative/Trace   Glucose, UA Negative Negative   Ketones, UA Negative Negative   RBC, UA Negative Negative   Bilirubin, UA Negative Negative   Urobilinogen, Ur 0.2 0.2 - 1.0 mg/dL   Nitrite, UA Negative Negative   Microscopic Examination See below:       Assessment & Plan:   Problem List Items Addressed This Visit     Primary osteoarthritis involving multiple joints - Primary (Chronic)   Relevant Medications   diclofenac Sodium (VOLTAREN) 1 % GEL   Chronic pain syndrome (Chronic)   Insomnia   Esophageal reflux   Relevant Medications   omeprazole (PRILOSEC) 40 MG capsule   Bipolar depression (HCC)    GERD On Omeprazole '40mg'$  BID Concern if medicaid no longer covering this for her due to duration. Will re order and try to submit an authorization if requested. May need to switch to alternative med such as Pantoprazole.  Osteoarthritis, multiple joints, hands Bilateral hand and fingers with arthritis and joint pain Trial topical Voltaren, already on oral NSAID. On other pain medication for back and spine with Tizandine, Meloxicam, Lyrica/Gabapentin  HTN Mild elevated Review BP again in future, may restart 1 med if need, in past had to hold meds due to hypotension  Bipolar Mood Insomnia Continue current therapy per psychiatry  Meds ordered this encounter  Medications   diclofenac Sodium (VOLTAREN) 1 % GEL    Sig: Apply 2 g topically 4 (four) times daily as needed (arthritis hand/thumb).    Dispense:  100 g    Refill:  3   omeprazole (PRILOSEC) 40 MG capsule    Sig: Take 1 capsule (40 mg total) by mouth 2 (two) times daily before a meal.    Dispense:   180 capsule    Refill:  3    Maximum Refills Reached     Follow up plan: Return in about 4 months (around 03/10/2021) for 4 month follow-up HTN (restart med?) DM A1c, / Mood / Pain /  Skin/Eczema.  [name], [facility] 11/08/2020, 2:05 PM

## 2020-11-16 ENCOUNTER — Telehealth: Payer: Self-pay

## 2020-11-16 NOTE — Telephone Encounter (Signed)
-----   Message from Gae Dry, MD sent at 09/12/2020 12:47 PM EDT ----- Regarding: MMG Received notice she has not received MMG yet as ordered at her Annual. Please check and encourage her to do this, and document conversation.

## 2020-11-16 NOTE — Telephone Encounter (Signed)
Pt aware to call and schedule mammo, I gave her the number to [place].

## 2020-11-20 ENCOUNTER — Ambulatory Visit: Payer: Medicaid Other | Admitting: Urology

## 2020-11-20 ENCOUNTER — Telehealth: Payer: Self-pay

## 2020-11-20 ENCOUNTER — Other Ambulatory Visit: Payer: Self-pay

## 2020-11-20 DIAGNOSIS — N3946 Mixed incontinence: Secondary | ICD-10-CM

## 2020-11-20 DIAGNOSIS — R110 Nausea: Secondary | ICD-10-CM

## 2020-11-20 MED ORDER — CIPROFLOXACIN HCL 250 MG PO TABS: 250.0000 mg | ORAL_TABLET | Freq: Two times a day (BID) | ORAL | 0 refills | Status: DC

## 2020-11-20 MED ORDER — PROMETHAZINE HCL 25 MG PO TABS: 25.0000 mg | ORAL_TABLET | Freq: Three times a day (TID) | ORAL | 2 refills | Status: DC | PRN

## 2020-11-20 NOTE — Progress Notes (Signed)
Called pt to reschedule botox as she was unable to make appointment due to over sleeping. I set up pt for lab appointment and resent in antibiotics. Patient aware.

## 2020-11-20 NOTE — Telephone Encounter (Signed)
Copied from [place] [id number]. Topic: General - Other >> Nov 16, 2020 10:34 AM Darlene Barber A wrote: Reason for CRM: The patient would like to be contacted regarding the quantity of their promethazine (PHENERGAN) 25 MG tablet [id number]   The patient shares that they've been prescribed 30 a month and are unable to make an entire prescription last   Please contact further when possible

## 2020-11-20 NOTE — Telephone Encounter (Signed)
Pill count was increased today from 30 to '60mg'$ , I sent new rx in.  Please let her know that she may also contact her previous GI Doctors office. [facility] to schedule follow-up with them on nausea to see if they have other treatment recommendations. If needs a new referral let me know.  [name], [facility] 11/20/2020, 5:12 PM

## 2020-11-21 ENCOUNTER — Other Ambulatory Visit: Payer: Self-pay | Admitting: Family Medicine

## 2020-11-21 DIAGNOSIS — J0110 Acute frontal sinusitis, unspecified: Secondary | ICD-10-CM

## 2020-11-21 DIAGNOSIS — J432 Centrilobular emphysema: Secondary | ICD-10-CM

## 2020-11-21 DIAGNOSIS — J3089 Other allergic rhinitis: Secondary | ICD-10-CM

## 2020-11-21 DIAGNOSIS — J410 Simple chronic bronchitis: Secondary | ICD-10-CM

## 2020-11-21 NOTE — Telephone Encounter (Signed)
Requested medication (s) are due for refill today - yes  Requested medication (s) are on the active medication list -yes  Future visit scheduled -no  Last refill: 10/23/20  Notes to clinic: Symbicort-Rx filled for chronic diagnosis- other 2 request have acute diagnosis- sent for review of request  Requested Prescriptions  Pending Prescriptions Disp Refills   fluticasone (FLONASE) 50 MCG/ACT nasal spray [Pharmacy Med Name: Fluticasone Propionate 50MCG/ACT SUSP] 16 g 0    Sig: ONE SPRAY EACH NOSTRIL ONCE A DAY     Ear, Nose, and Throat: Nasal Preparations - Corticosteroids Passed - 11/21/2020 10:16 AM      Passed - Valid encounter within last 12 months    Recent Outpatient Visits           1 week ago Primary osteoarthritis involving multiple joints   [place], DO   4 months ago Controlled type 2 diabetes mellitus with complication, without long-term current use of insulin [facility])   [facility] Olin Hauser, DO   7 months ago Psychophysiological insomnia   [facility] Olin Hauser, DO   11 months ago Neuropathy of left upper extremity   [place], DO   1 year ago Hypotension due to drugs   [facility], Devonne Doughty, DO               albuterol (PROAIR HFA) 108 (90 Base) MCG/ACT inhaler 8.5 g 0    Sig: INHALE 2 PUFFS INTO LUNGS EVERY FOUR HOURS AS NEEDED WHEEZING SHORTNESS OF BREATH     Pulmonology:  Beta Agonists Failed - 11/21/2020 10:16 AM      Failed - One inhaler should last at least one month. If the patient is requesting refills earlier, contact the patient to check for uncontrolled symptoms.      Passed - Valid encounter within last 12 months    Recent Outpatient Visits           1 week ago Primary osteoarthritis involving multiple joints   [place], DO   4  months ago Controlled type 2 diabetes mellitus with complication, without long-term current use of insulin [facility])   [facility] Olin Hauser, DO   7 months ago Psychophysiological insomnia   [facility] Olin [facility]er, DO   11 months ago Neuropathy of left upper extremity   [place], DO   1 year ago Hypotension due to drugs   [place], DO              Signed Prescriptions Disp Refills   SYMBICORT 160-4.5 MCG/ACT inhaler 10.2 g 0    Sig: INHALE 2 PUFFS INTO LUNGS IN THE MORNING AND AT BEDTIME     Pulmonology:  Combination Products Passed - 11/21/2020 10:16 AM      Passed - Valid encounter within last 12 months    Recent Outpatient Visits           1 week ago Primary osteoarthritis involving multiple joints   [place], DO   4 months ago Controlled type 2 diabetes mellitus with complication, without long-term current use of insulin [facility])   [place], DO   7 months ago Psychophysiological insomnia   [facility], [place]  [place], DO   11 months ago Neuropathy of left upper extremity   [place], DO   1 year ago Hypotension due to drugs   [place], DO                 Requested Prescriptions  Pending Prescriptions Disp Refills   fluticasone (FLONASE) 50 MCG/ACT nasal spray [Pharmacy Med Name: Fluticasone Propionate 50MCG/ACT SUSP] 16 g 0    Sig: ONE SPRAY EACH NOSTRIL ONCE A DAY     Ear, Nose, and Throat: Nasal Preparations - Corticosteroids Passed - 11/21/2020 10:16 AM      Passed - Valid encounter within last 12 months    Recent Outpatient Visits           1 week ago Primary osteoarthritis involving multiple joints   [place], DO   4 months ago Controlled type 2 diabetes mellitus with complication, without long-term current use of insulin ([place])   [facility] Olin Hauser, DO   7 months ago Psychophysiological insomnia   [facility] Olin Hauser, DO   11 months ago Neuropathy of left upper extremity   [place], DO   1 year ago Hypotension due to drugs   [facility], Devonne Doughty, DO               albuterol (PROAIR HFA) 108 (90 Base) MCG/ACT inhaler 8.5 g 0    Sig: INHALE 2 PUFFS INTO LUNGS EVERY FOUR HOURS AS NEEDED WHEEZING SHORTNESS OF BREATH     Pulmonology:  Beta Agonists Failed - 11/21/2020 10:16 AM      Failed - One inhaler should last at least one month. If the patient is requesting refills earlier, contact the patient to check for uncontrolled symptoms.      Passed - Valid encounter within last 12 months    Recent Outpatient Visits           1 week ago Primary osteoarthritis involving multiple joints   [place], DO   4 months ago Controlled type 2 diabetes mellitus with complication, without long-term current use of insulin [facility])   [facility] Olin Hauser, DO   7 months ago Psychophysiological insomnia   [facility] Olin Hauser, DO   11 months ago Neuropathy of left upper extremity   [place], DO   1 year ago Hypotension due to drugs   [place], DO              Signed Prescriptions Disp Refills   SYMBICORT 160-4.5 MCG/ACT inhaler 10.2 g 0    Sig: INHALE 2 PUFFS INTO LUNGS IN THE MORNING AND AT BEDTIME     Pulmonology:  Combination Products Passed - 11/21/2020 10:16 AM      Passed - Valid encounter within last 12 months    Recent  Outpatient Visits           1 week ago Primary osteoarthritis involving multiple joints   [place], DO   4 months ago Controlled type 2 diabetes mellitus with complication, without long-term current use of insulin [facility])   [place], DO   7 months ago Psychophysiological insomnia  [facility] Olin Hauser, DO   11 months ago Neuropathy of left upper extremity   [place], DO   1 year ago Hypotension due to drugs   [place], [place]

## 2020-11-23 ENCOUNTER — Other Ambulatory Visit: Payer: Self-pay | Admitting: *Deleted

## 2020-11-23 ENCOUNTER — Other Ambulatory Visit: Payer: Self-pay

## 2020-11-23 NOTE — Patient Outreach (Signed)
Care Coordination  11/23/2020  Darlene Barber [birth year] [id number]  Darlene Barber was referred to the [facility] Managed Care High Risk team for assistance with care coordination and care management services. Care coordination/care management services as part of the Medicaid benefit was offered to the patient today. The patient declined assistance offered today.   Plan: The Medicaid Managed Care High Risk team is available at any time in the future to assist with care coordination/care management services upon referral.   [name] RN, BSN [place] RN Care Coordinator

## 2020-11-23 NOTE — Patient Instructions (Signed)
Thank you for taking time to speak with me today about care coordination and care management services available to you at no cost as part of your Medicaid benefit. These services are voluntary. Our team is available to provide assistance regarding your health care needs at any time. Please do not hesitate to reach out to me if we can be of service to you at any time in the future. Bear, BSN [place] RN Care Coordinator

## 2020-12-04 ENCOUNTER — Other Ambulatory Visit: Payer: Medicaid Other

## 2020-12-04 ENCOUNTER — Other Ambulatory Visit: Payer: Self-pay | Admitting: Family Medicine

## 2020-12-04 ENCOUNTER — Other Ambulatory Visit: Payer: Self-pay

## 2020-12-04 DIAGNOSIS — N3946 Mixed incontinence: Secondary | ICD-10-CM | POA: Diagnosis not present

## 2020-12-04 DIAGNOSIS — L309 Dermatitis, unspecified: Secondary | ICD-10-CM

## 2020-12-04 LAB — URINALYSIS, COMPLETE
Bilirubin, UA: NEGATIVE
Glucose, UA: NEGATIVE
Leukocytes,UA: NEGATIVE
Nitrite, UA: NEGATIVE
RBC, UA: NEGATIVE
Specific Gravity, UA: >1.03 — ABNORMAL HIGH (ref 1.005–1.030)
Urobilinogen, Ur: 0.2 mg/dL (ref 0.2–1.0)
pH, UA: 6 (ref 5.0–7.5)

## 2020-12-04 LAB — MICROSCOPIC EXAMINATION: Bacteria, UA: NONE SEEN

## 2020-12-07 ENCOUNTER — Telehealth: Payer: Self-pay

## 2020-12-07 NOTE — Telephone Encounter (Signed)
Pt left msg on triage saying she is having insurance issues and does not want to loose her doctors. Called her back to get more details, no answer, LVMTRC.

## 2020-12-08 LAB — CULTURE, URINE COMPREHENSIVE

## 2020-12-13 ENCOUNTER — Ambulatory Visit: Payer: Medicaid Other | Admitting: Obstetrics & Gynecology

## 2020-12-18 ENCOUNTER — Other Ambulatory Visit: Payer: Self-pay | Admitting: Family Medicine

## 2020-12-18 ENCOUNTER — Encounter: Payer: Self-pay | Admitting: Urology

## 2020-12-18 ENCOUNTER — Ambulatory Visit: Payer: Medicaid Other | Admitting: Urology

## 2020-12-18 DIAGNOSIS — M150 Primary generalized (osteo)arthritis: Secondary | ICD-10-CM

## 2020-12-18 DIAGNOSIS — M159 Polyosteoarthritis, unspecified: Secondary | ICD-10-CM

## 2020-12-18 DIAGNOSIS — J410 Simple chronic bronchitis: Secondary | ICD-10-CM

## 2020-12-18 NOTE — Telephone Encounter (Signed)
Requested medications are due for refill today yes  Requested medications are on the active medication list yes  Last refill 11/22/20  Last visit 11/08/20  Future visit scheduled no  Notes to clinic Zanaflex is not delegated, Mobic failed protocol due to hgb from 2014 and creatinine elevated and more than 360 days ago, 09/29/20, please assess.

## 2020-12-20 ENCOUNTER — Other Ambulatory Visit: Payer: Self-pay | Admitting: Family Medicine

## 2020-12-20 DIAGNOSIS — M159 Polyosteoarthritis, unspecified: Secondary | ICD-10-CM

## 2020-12-21 ENCOUNTER — Other Ambulatory Visit: Payer: Self-pay | Admitting: Family Medicine

## 2020-12-21 DIAGNOSIS — J432 Centrilobular emphysema: Secondary | ICD-10-CM

## 2020-12-21 DIAGNOSIS — E118 Type 2 diabetes mellitus with unspecified complications: Secondary | ICD-10-CM

## 2020-12-22 NOTE — Telephone Encounter (Signed)
Requested medication (s) are due for refill today: Yes  Requested medication (s) are on the active medication list: Yes  Last refill:  8 months ago  Future visit scheduled: No  Notes to clinic:  Unable to refill per protocol due to failed labs, no updated results, last Cr 09/2019      Requested Prescriptions  Pending Prescriptions Disp Refills   [place] 2 MG/0.85ML Platinum [Pharmacy Med Name: Bydureon BCise 2MG/0.85ML AUIJ] 3.4 mL 3    Sig: INJECT 2 MG INTO THE SKIN ONCE A WEEK.     Endocrinology:  Diabetes - GLP-1 Receptor Agonists - exenatide Failed - 12/21/2020  2:02 PM      Failed - Cr in normal range and within 360 days    Creat  Date Value Ref Range Status  09/30/2019 0.83 0.50 - 1.05 mg/dL Final    Comment:    For patients >7 years of age, the reference limit for Creatinine is approximately 13% higher for people identified as African-American. .           Failed - eGFR in normal range and within 360 days    GFR, Est African American  Date Value Ref Range Status  09/30/2019 93 > OR = 60 mL/min/1.26m Final   GFR, Est Non African American  Date Value Ref Range Status  09/30/2019 81 > OR = 60 mL/min/1.753mFinal          Passed - HBA1C is between 0 and 7.9 and within 180 days    Hemoglobin A1C  Date Value Ref Range Status  06/29/2020 5.2 4.0 - 5.6 % Final   Hgb A1c MFr Bld  Date Value Ref Range Status  11/24/2018 6.0 (H) <5.7 % of total Hgb Final    Comment:    For someone without known diabetes, a hemoglobin  A1c value between 5.7% and 6.4% is consistent with prediabetes and should be confirmed with a  follow-up test. . For someone with known diabetes, a value <7% indicates that their diabetes is well controlled. A1c targets should be individualized based on duration of diabetes, age, comorbid conditions, and other considerations. . This assay result is consistent with an increased risk of diabetes. . Currently, no consensus exists  regarding use of hemoglobin A1c for diagnosis of diabetes for children. . Renella Cunas Valid encounter within last 6 months    Recent Outpatient Visits           1 month ago Primary osteoarthritis involving multiple joints   [place]   5 months ago Controlled type 2 diabetes mellitus with complication, without long-term current use of insulin [facility]  [facility]   8 months ago Psychophysiological insomnia   [facility]   1 year ago Neuropathy of left upper extremity   [place]   1 year ago Hypotension due to drugs   [place]              Signed Prescriptions Disp Refills   SYMBICORT 160-4.5 MCG/ACT inhaler 10.2 g 1    Sig: INHALE 2 PUFFS INTO LUNGS EVERY MORNING AND EVERY NIGHT AT BEDTIME     Pulmonology:  Combination Products Passed - 12/21/2020  2:02 PM      Passed - Valid encounter within last  12 months    Recent Outpatient Visits           1 month ago Primary osteoarthritis involving multiple joints   [place], DO   5 months ago Controlled type 2 diabetes mellitus with complication, without long-term current use of insulin [facility])   [place], DO   8 months ago Psychophysiological insomnia   [place], DO   1 year ago Neuropathy of left upper extremity   [place], DO   1 year ago Hypotension due to drugs   [place], Devonne Doughty, DO

## 2020-12-22 NOTE — Telephone Encounter (Signed)
Requested Prescriptions  Pending Prescriptions Disp Refills  . [place] 2 MG/0.85ML AUIJ [Pharmacy Med Name: Bydureon BCise 2MG/0.85ML AUIJ] 3.4 mL 3    Sig: INJECT 2 MG INTO THE SKIN ONCE A WEEK.     Endocrinology:  Diabetes - GLP-1 Receptor Agonists - exenatide Failed - 12/21/2020  2:02 PM      Failed - Cr in normal range and within 360 days    Creat  Date Value Ref Range Status  09/30/2019 0.83 0.50 - 1.05 mg/dL Final    Comment:    For patients >61 years of age, the reference limit for Creatinine is approximately 13% higher for people identified as African-American. .          Failed - eGFR in normal range and within 360 days    GFR, Est African American  Date Value Ref Range Status  09/30/2019 93 > OR = 60 mL/min/1.62m Final   GFR, Est Non African American  Date Value Ref Range Status  09/30/2019 81 > OR = 60 mL/min/1.722mFinal         Passed - HBA1C is between 0 and 7.9 and within 180 days    Hemoglobin A1C  Date Value Ref Range Status  06/29/2020 5.2 4.0 - 5.6 % Final   Hgb A1c MFr Bld  Date Value Ref Range Status  11/24/2018 6.0 (H) <5.7 % of total Hgb Final    Comment:    For someone without known diabetes, a hemoglobin  A1c value between 5.7% and 6.4% is consistent with prediabetes and should be confirmed with a  follow-up test. . For someone with known diabetes, a value <7% indicates that their diabetes is well controlled. A1c targets should be individualized based on duration of diabetes, age, comorbid conditions, and other considerations. . This assay result is consistent with an increased risk of diabetes. . Currently, no consensus exists regarding use of hemoglobin A1c for diagnosis of diabetes for children. . Renella Cunas Valid encounter within last 6 months    Recent Outpatient Visits          1 month ago Primary osteoarthritis involving multiple joints   [place]   5 months  ago Controlled type 2 diabetes mellitus with complication, [facility] long-term current use of insulin [facility]  [place]   8 months ago Psychophysiological insomnia   [place]   1 year ago Neuropathy of left upper extremity   [place]   1 year ago Hypotension due to drugs   [place]             . SYMBICORT 160-4.5 MCG/ACT inhaler [PMackey Birchwooded Name: Symbicort 160-4.5MCG/ACT AERO] 10.2 g 0    Sig: INHALE 2 PUFFS INTO LUNGS EVERY MORNING AND EVERY NIGHT AT BEDTIME     Pulmonology:  Combination Products Passed - 12/21/2020  2:02 PM      Passed - Valid encounter within last 12 months    Recent Outpatient Visits          1 month ago Primary osteoarthritis involving multiple joints   [place]   5 months ago Controlled type 2 diabetes mellitus with complication, without long-term current use of insulin ([facility]  [facility]  Devonne Doughty, DO   8 months ago Psychophysiological insomnia   [place], DO   1 year ago Neuropathy of left upper extremity   [place], DO   1 year ago Hypotension due to drugs   [place], [place]

## 2020-12-25 ENCOUNTER — Other Ambulatory Visit: Payer: Self-pay | Admitting: Family Medicine

## 2020-12-25 DIAGNOSIS — M159 Polyosteoarthritis, unspecified: Secondary | ICD-10-CM

## 2020-12-26 ENCOUNTER — Other Ambulatory Visit: Payer: Self-pay | Admitting: Family Medicine

## 2020-12-26 NOTE — Telephone Encounter (Signed)
Requested medication (s) are due for refill today: Yes  Requested medication (s) are on the active medication list: Yes  Last refill:  12/19/20  Future visit scheduled: Yes  Notes to clinic:  Pharmacy states they did not receive refill. Protocol indicates pt. Needs lab work.    Requested Prescriptions  Pending Prescriptions Disp Refills   meloxicam (MOBIC) 15 MG tablet [Pharmacy Med Name: Meloxicam '15MG'$  TABS] 30 tablet 2    Sig: TAKE 1 TABLET BY MOUTH DAILY AS NEEDED FOR PAIN     Analgesics:  COX2 Inhibitors Failed - 12/25/2020  3:00 PM      Failed - HGB in normal range and within 360 days    Hemoglobin  Date Value Ref Range Status  09/30/2019 14.7 11.7 - 15.5 g/dL Final   HGB  Date Value Ref Range Status  04/20/2012 16.7 (H) 12.0 - 16.0 g/dL Final          Failed - Cr in normal range and within 360 days    Creat  Date Value Ref Range Status  09/30/2019 0.83 0.50 - 1.05 mg/dL Final    Comment:    For patients >110 years of age, the reference limit for Creatinine is approximately 13% higher for people identified as African-American. Renella Cunas - Patient is not pregnant      Passed - Valid encounter within last 12 months    Recent Outpatient Visits           1 month ago Primary osteoarthritis involving multiple joints   [place], DO   6 months ago Controlled type 2 diabetes mellitus with complication, without long-term current use of insulin [facility])   [place], DO   9 months ago Psychophysiological insomnia   [place], DO   1 year ago Neuropathy of left upper extremity   [place], DO   1 year ago Hypotension due to drugs   [place], Devonne Doughty, DO

## 2020-12-26 NOTE — Telephone Encounter (Signed)
Requested medication (s) are due for refill today:   Yes  Requested medication (s) are on the active medication list:   Yes  Future visit scheduled:   No   Last ordered: 06/20/2020 #30, 5 refills  Needs new Rx.   Maximum refills reached per pharmacy.   Requested Prescriptions  Pending Prescriptions Disp Refills   valACYclovir (VALTREX) 1000 MG tablet [Pharmacy Med Name: valACYclovir HCl 1GM TABS*] 28 tablet     Sig: TAKE 1 TABLET BY MOUTH DAILY     Antimicrobials:  Antiviral Agents - Anti-Herpetic Passed - 12/26/2020  9:49 AM      Passed - Valid encounter within last 12 months    Recent Outpatient Visits           1 month ago Primary osteoarthritis involving multiple joints   [place], DO   6 months ago Controlled type 2 diabetes mellitus with complication, without long-term current use of insulin [facility])   [place], DO   9 months ago Psychophysiological insomnia   [place], DO   1 year ago Neuropathy of left upper extremity   [place], DO   1 year ago Hypotension due to drugs   [place], Devonne Doughty, DO

## 2021-01-04 ENCOUNTER — Other Ambulatory Visit: Payer: Self-pay

## 2021-01-04 ENCOUNTER — Other Ambulatory Visit: Payer: Medicaid Other

## 2021-01-04 DIAGNOSIS — N3281 Overactive bladder: Secondary | ICD-10-CM

## 2021-01-05 LAB — MICROSCOPIC EXAMINATION
Bacteria, UA: NONE SEEN
WBC, UA: NONE SEEN /hpf (ref 0–5)

## 2021-01-05 LAB — URINALYSIS, COMPLETE
Bilirubin, UA: NEGATIVE
Glucose, UA: NEGATIVE
Ketones, UA: NEGATIVE
Leukocytes,UA: NEGATIVE
Nitrite, UA: NEGATIVE
Protein,UA: NEGATIVE
RBC, UA: NEGATIVE
Specific Gravity, UA: 1.015 (ref 1.005–1.030)
Urobilinogen, Ur: 0.2 mg/dL (ref 0.2–1.0)
pH, UA: 6.5 (ref 5.0–7.5)

## 2021-01-08 ENCOUNTER — Telehealth: Payer: Self-pay

## 2021-01-08 LAB — CULTURE, URINE COMPREHENSIVE

## 2021-01-08 NOTE — Telephone Encounter (Signed)
Copied from [place] [id number]. Topic: General - Other >> Jan 05, 2021 10:16 AM Leward Quan A wrote: Reason for CRM: Patient called in to inform Dr Raliegh Ip that she changed her insurance from [facility] to [place] and they are stating that Dr Raliegh Ip need to send some type of approval to all her other doctors so she can continue seeing them. [facility] Dr Lita Mains, Dr Alba Destine spine and hip injections Ph# [phone], //  [facility] Dr Kenton Kingfisher Ph# [phone] Nakayama Neurologist Ph# [phone] // Dr McDermoth Bladder injections Ph# [phone] //  Dr Jens Som Podiatrist Ph# [phone] // [place] Ph# [phone] and Dr Donnelly Angelica GI Ph# [phone]  and Patient can be reached at Ph# [phone]

## 2021-01-08 NOTE — Telephone Encounter (Signed)
Routing this message to Lionel December for review.  Can you help clarify if this patient is going to need brand new referrals ordered for each of these specialists? Or are you familiar with what the insurance will need?  Let me know.  [name], [facility] 01/08/2021, 2:42 PM

## 2021-01-09 NOTE — Telephone Encounter (Signed)
Ok, from our review looks like no other required documentation should be needed.  Can you contact patient back and let her know to contact those doctor's offices next and give them the new ins information and see if they need anything else? They should be able to confirm with her if it is approved or if they are missing anything.  Darlene Putnam, DO [facility] 01/09/2021, 8:10 AM

## 2021-01-15 ENCOUNTER — Ambulatory Visit: Payer: Medicaid Other | Admitting: Urology

## 2021-01-16 ENCOUNTER — Ambulatory Visit: Payer: Medicaid Other | Admitting: Obstetrics & Gynecology

## 2021-01-23 ENCOUNTER — Other Ambulatory Visit: Payer: Self-pay | Admitting: Physician Assistant

## 2021-01-23 DIAGNOSIS — R202 Paresthesia of skin: Secondary | ICD-10-CM

## 2021-01-23 DIAGNOSIS — G479 Sleep disorder, unspecified: Secondary | ICD-10-CM

## 2021-01-23 DIAGNOSIS — R251 Tremor, unspecified: Secondary | ICD-10-CM

## 2021-01-23 DIAGNOSIS — R519 Headache, unspecified: Secondary | ICD-10-CM

## 2021-01-23 DIAGNOSIS — R413 Other amnesia: Secondary | ICD-10-CM

## 2021-01-23 DIAGNOSIS — R2689 Other abnormalities of gait and mobility: Secondary | ICD-10-CM

## 2021-01-23 DIAGNOSIS — F39 Unspecified mood [affective] disorder: Secondary | ICD-10-CM

## 2021-01-23 DIAGNOSIS — R200 Anesthesia of skin: Secondary | ICD-10-CM

## 2021-01-29 ENCOUNTER — Ambulatory Visit: Payer: Self-pay | Admitting: *Deleted

## 2021-01-29 NOTE — Telephone Encounter (Signed)
B/P yesterday 182/127. Headaches daily for about 2 weeks, dizziness on/off. Vomiting for 6 days.  Reason for Disposition  [1] Systolic BP  >= 610 OR Diastolic >= 960 AND [4] cardiac or neurologic symptoms (e.g., chest pain, difficulty breathing, unsteady gait, blurred vision)  Answer Assessment - Initial Assessment Questions 1. BLOOD PRESSURE: "What is the blood pressure?" "Did you take at least two measurements 5 minutes apart?"     Just once-last night 182/127, now  2. ONSET: "When did you take your blood pressure?"     now 3. HOW: "How did you obtain the blood pressure?" (e.g., visiting nurse, automatic home BP monitor)     Home monitor 4. HISTORY: "Do you have a history of high blood pressure?"     The last few OV's B/P has been high 5. MEDICATIONS: "Are you taking any medications for blood pressure?" "Have you missed any doses recently?"     no 6. OTHER SYMPTOMS: "Do you have any symptoms?" (e.g., headache, chest pain, blurred vision, difficulty breathing, weakness)     HA and dizziness 7. PREGNANCY: "Is there any chance you are pregnant?" "When was your last menstrual period?"     na  Protocols used: Blood Pressure - High-A-AH

## 2021-01-29 NOTE — Telephone Encounter (Signed)
B/P yesterday 182/127, Today 169/106 with blurred vision and dizziness and HA. She has been feeling these symptoms on/off for about 2 weeks. Also, vomiting for 6 days, sipping and keeping down fluids, voiding at least once every 8 hours she reported. Sometimes with abdominal pain. No thermometer to check temperature but has experienced chills. Given the patient's high b/p, high b/p symptoms and vomiting for a lengthy period, advised ED at this time. Patient agrees and her sister will drive her.  I scheduled an OV for next week for follow-up on the high blood pressure readings.

## 2021-02-06 ENCOUNTER — Encounter: Payer: Self-pay | Admitting: Family Medicine

## 2021-02-06 ENCOUNTER — Ambulatory Visit (INDEPENDENT_AMBULATORY_CARE_PROVIDER_SITE_OTHER): Payer: Medicaid Other | Admitting: Family Medicine

## 2021-02-06 ENCOUNTER — Other Ambulatory Visit: Payer: Self-pay

## 2021-02-06 VITALS — BP 159/94 | HR 84 | Ht 59.0 in | Wt 154.4 lb

## 2021-02-06 DIAGNOSIS — F319 Bipolar disorder, unspecified: Secondary | ICD-10-CM

## 2021-02-06 DIAGNOSIS — I10 Essential (primary) hypertension: Secondary | ICD-10-CM

## 2021-02-06 DIAGNOSIS — K219 Gastro-esophageal reflux disease without esophagitis: Secondary | ICD-10-CM | POA: Diagnosis not present

## 2021-02-06 MED ORDER — SUCRALFATE 1 G PO TABS: 1.0000 g | ORAL_TABLET | Freq: Three times a day (TID) | ORAL | 2 refills | Status: DC

## 2021-02-06 MED ORDER — AMLODIPINE BESYLATE 10 MG PO TABS
10.0000 mg | ORAL_TABLET | Freq: Every day | ORAL | 1 refills | Status: DC
Start: 2021-02-06 — End: 2021-07-04

## 2021-02-06 NOTE — Patient Instructions (Addendum)
Thank you for coming to the office today.  Stay tuned for physical therapy - will send request to [place] that you contact [facility] for Rexulti request  STart on Amlodipine 10mg  tablet daily, you can cut them in HALF for 5mg  daily to start, monitor BP, goal < 140/90. If still elevated, then take a whole tablet.  If not improving after 2-4 weeks, call back and we can add or change therapy.  Stomach acid, already on max dose Omeprazole. Added Carafate as needed with meals and or bedtime.  Likely stress and smoking factors involved.  Please schedule a Follow-up Appointment to: Return in about 3 months (around 05/08/2021) for 3 month follow-up HTN, GERD, Anxiety/Mood, Pain.  If you have any other questions or concerns, please feel free to call the office or send a message through [place]. You may also schedule an earlier appointment if necessary.  Additionally, you may be receiving a survey about your experience at our office within a few days to 1 week by e-mail or mail. We value your feedback.  [name], DO [place]

## 2021-02-06 NOTE — Progress Notes (Signed)
Subjective:    Patient ID: Darlene Barber, female    DOB: 09-Feb-1966, 55 y.o.   MRN: [id number]  Darlene Barber is a 55 y.o. female presenting on 02/06/2021 for Hypertension   HPI  CHRONIC HTN: Reports known history of Hypotension on multiple medications back in earlier 2021, she had problem with wt loss and hypotension, she was taken off Amlodipine 10mg  daily, Atenolol 50mg  daily, Losartan 50mg  daily. She has new BP cuff at home. Unsure how to calibrate the BP Cuff. She can bring in future to calibrate it. Her BP trend has been lower in past Major life stressor, her mother diagnosed with terminal lung cancer and on hospice. Current Meds - None   Denies CP, dyspnea, HA, edema, dizziness / lightheadedness  GERD Worse with anxiety stress On Omeprazole 40mg  BID Also admits factor with smoking.  Chronic Pain [facility] Unable to do ESI due to high BP, last attempt 01/17/21 BP >170/100 Admits Physiatry wants her to do physical therapy but medicaid would not cover it. - Now she asks about doing PT and needs order.  Bipolar Major Depression recurrent Followed by [facility]  asking for Rexulti today, has not discussed with them.  Depression screen [facility] 2/9 03/28/2020 12/01/2018 08/21/2018  Decreased Interest 1 0 0  Down, Depressed, Hopeless 1 1 0  PHQ - 2 Score 2 1 0  Altered sleeping 3 3 2   Tired, decreased energy 3 3 2   Change in appetite 1 1 3   Feeling bad or failure about yourself  0 0 0  Trouble concentrating 0 0 0  Moving slowly or fidgety/restless 0 0 0  Suicidal thoughts 0 0 0  PHQ-9 Score 9 8 7   Difficult doing work/chores Somewhat difficult Somewhat difficult Not difficult at all    Social History   Tobacco Use   Smoking status: Every Day    Packs/day: 1.00    Years: 30.00    Pack years: 30.00    Types: Cigarettes   Smokeless tobacco: Current  Vaping Use   Vaping Use: Never used  Substance Use Topics   Alcohol use: Yes    Comment: occ once per year    Drug use: Yes    Types: Marijuana    Comment: Current use    Review of Systems Per HPI unless specifically indicated above     Objective:    BP (!) 159/94   Pulse 84   Ht 4\' 11"  (1.499 m)   Wt 154 lb 6.4 oz (70 kg)   SpO2 98%   BMI 31.19 kg/m   Wt Readings from Last 3 Encounters:  02/06/21 154 lb 6.4 oz (70 kg)  11/08/20 152 lb 3.2 oz (69 kg)  07/05/20 141 lb (64 kg)    Physical Exam Vitals and nursing note reviewed.  Constitutional:      General: She is not in acute distress.    Appearance: Normal appearance. She is well-developed. She is not diaphoretic.     Comments: Well-appearing, comfortable, cooperative  HENT:     Head: Normocephalic and atraumatic.  Eyes:     General:        Right eye: No discharge.        Left eye: No discharge.     Conjunctiva/sclera: Conjunctivae normal.  Cardiovascular:     Rate and Rhythm: Normal rate.  Pulmonary:     Effort: Pulmonary effort is normal.  Skin:    General: Skin is warm and dry.     Findings:  No erythema or rash.  Neurological:     Mental Status: She is alert and oriented to person, place, and time.  Psychiatric:        Mood and Affect: Mood normal.        Behavior: Behavior normal.        Thought Content: Thought content normal.     Comments: Well groomed, good eye contact, normal speech and thoughts      Results for orders placed or performed in visit on 01/04/21  CULTURE, URINE COMPREHENSIVE   Specimen: Urine   UR  Result Value Ref Range   Urine Culture, Comprehensive Final report    Organism ID, Bacteria Comment   Microscopic Examination   Urine  Result Value Ref Range   WBC, UA None seen 0 - 5 /hpf   RBC 0-2 0 - 2 /hpf   Epithelial Cells (non renal) 0-10 0 - 10 /hpf   Casts Present None seen /lpf   Cast Type Hyaline casts N/A   Mucus, UA Present (A) Not Estab.   Bacteria, UA None seen None seen/Few  Urinalysis, Complete  Result Value Ref Range   Specific Gravity, UA 1.015 1.005 - 1.030   pH, UA  6.5 5.0 - 7.5   Color, UA Yellow Yellow   Appearance Ur Clear Clear   Leukocytes,UA Negative Negative   Protein,UA Negative Negative/Trace   Glucose, UA Negative Negative   Ketones, UA Negative Negative   RBC, UA Negative Negative   Bilirubin, UA Negative Negative   Urobilinogen, Ur 0.2 0.2 - 1.0 mg/dL   Nitrite, UA Negative Negative   Microscopic Examination See below:       Assessment & Plan:   Problem List Items Addressed This Visit     Gastroesophageal reflux disease without esophagitis   Relevant Medications   sucralfate (CARAFATE) 1 g tablet   Essential hypertension - Primary   Relevant Medications   amLODipine (NORVASC) 10 MG tablet   Bipolar depression (HCC)    Stay tuned for physical therapy - will send request to [place] that you contact [facility] for Rexulti request, as I am not able to manage the bipolar medications currently.  HTN Likely stress and smoking also involved with this and GERD Previously on Amlodipine, Atenolol and Losartan  Start on Amlodipine 10mg  tablet daily, you can cut them in HALF for 5mg  daily to start, monitor BP, goal < 140/90. If still elevated, then take a whole tablet.  If not improving after 2-4 weeks, call back and we can add or change therapy.  Stomach acid, already on max dose Omeprazole. Added Carafate as needed with meals and or bedtime. Likely stress and smoking factors involved. Consider GI referral  Meds ordered this encounter  Medications   amLODipine (NORVASC) 10 MG tablet    Sig: Take 1 tablet (10 mg total) by mouth daily. May start with half tablet 5mg  if preferred.    Dispense:  90 tablet    Refill:  1   sucralfate (CARAFATE) 1 g tablet    Sig: Take 1 tablet (1 g total) by mouth 4 (four) times daily -  with meals and at bedtime. As needed only.    Dispense:  40 tablet    Refill:  2      Follow up plan: Return in about 3 months (around 05/08/2021) for 3 month follow-up HTN, GERD, Anxiety/Mood,  Pain.  [name], [facility] 02/06/2021, 2:54 PM

## 2021-02-08 ENCOUNTER — Other Ambulatory Visit: Payer: Self-pay | Admitting: Obstetrics & Gynecology

## 2021-02-08 DIAGNOSIS — Z1231 Encounter for screening mammogram for malignant neoplasm of breast: Secondary | ICD-10-CM

## 2021-02-16 ENCOUNTER — Telehealth: Payer: Self-pay | Admitting: Urology

## 2021-02-16 NOTE — Telephone Encounter (Signed)
Pt missed her 01/15/2021 Botox appt with Dr. Erlene Quan and would like to reschedule. I was unsure if I was able to just schedule it. She called from [phone]. Not sure if the numbers on in her chart are working numbers.

## 2021-02-19 ENCOUNTER — Other Ambulatory Visit: Payer: Self-pay | Admitting: Family Medicine

## 2021-02-19 DIAGNOSIS — J0110 Acute frontal sinusitis, unspecified: Secondary | ICD-10-CM

## 2021-02-19 DIAGNOSIS — J3089 Other allergic rhinitis: Secondary | ICD-10-CM

## 2021-02-19 DIAGNOSIS — J432 Centrilobular emphysema: Secondary | ICD-10-CM

## 2021-02-19 NOTE — Telephone Encounter (Signed)
Requested Prescriptions  Pending Prescriptions Disp Refills  . fluticasone (FLONASE) 50 MCG/ACT nasal spray [Pharmacy Med Name: Fluticasone Propionate 50MCG/ACT SUSP] 16 g 2    Sig: 1 SPARY IN EACH NOSTRIL DAILY     Ear, Nose, and Throat: Nasal Preparations - Corticosteroids Passed - 02/19/2021 12:12 PM      Passed - Valid encounter within last 12 months    Recent Outpatient Visits          1 week ago Essential hypertension   [place], DO   3 months ago Primary osteoarthritis involving multiple joints   [place], DO   7 months ago Controlled type 2 diabetes mellitus with complication, without long-term current use of insulin [facility])   [place], DO   10 months ago Psychophysiological insomnia   [place], DO   1 year ago Neuropathy of left upper extremity   [place], DO             . SYMBICORT 160-4.5 MCG/ACT inhaler [Pharmacy Med Name: Symbicort 160-4.5MCG/ACT AERO] 10.2 g 1    Sig: INHALE 2 PUFFS EVERY MORNING AND EVERY EVENING     Pulmonology:  Combination Products Passed - 02/19/2021 12:12 PM      Passed - Valid encounter within last 12 months    Recent Outpatient Visits          1 week ago Essential hypertension   [place], DO   3 months ago Primary osteoarthritis involving multiple joints   [place], DO   7 months ago Controlled type 2 diabetes mellitus with complication, without long-term current use of insulin [facility])   [place], DO   10 months ago Psychophysiological insomnia   [place], DO   1 year ago Neuropathy of left upper extremity   [place],  Devonne Doughty, DO

## 2021-02-28 ENCOUNTER — Other Ambulatory Visit: Payer: Self-pay | Admitting: Family Medicine

## 2021-02-28 ENCOUNTER — Telehealth: Payer: Self-pay

## 2021-02-28 DIAGNOSIS — L209 Atopic dermatitis, unspecified: Secondary | ICD-10-CM

## 2021-02-28 DIAGNOSIS — R110 Nausea: Secondary | ICD-10-CM

## 2021-02-28 NOTE — Telephone Encounter (Signed)
Copied from [place] [id number]. Topic: Referral - Question >> Feb 27, 2021  2:00 PM [place], Brayton Layman A wrote: Reason for CRM: Pt wanted to know if Dr Raliegh Ip could send in a referral to a dermatologist for the dermatitis on her hands and arms, please advise.

## 2021-02-28 NOTE — Telephone Encounter (Signed)
Requested medication (s) are due for refill today: Yes  Requested medication (s) are on the active medication list: Yes  Last refill:  11/20/20  Future visit scheduled: No  Notes to clinic:  See request.    Requested Prescriptions  Pending Prescriptions Disp Refills   promethazine (PHENERGAN) 25 MG tablet [Pharmacy Med Name: Promethazine HCl 25MG  TABS] 30 tablet     Sig: TAKE 1 TABLET BY MOUTH EVERY SIX HOURS AS NEEDED FOR NAUSEA     Not Delegated - Gastroenterology: Antiemetics Failed - 02/28/2021  1:04 PM      Failed - This refill cannot be delegated      Passed - Valid encounter within last 6 months    Recent Outpatient Visits           3 weeks ago Essential hypertension   [place], DO   3 months ago Primary osteoarthritis involving multiple joints   [place], DO   8 months ago Controlled type 2 diabetes mellitus with complication, without long-term current use of insulin [facility])   [facility] Olin [place], DO   11 months ago Psychophysiological insomnia   [place], DO   1 year ago Neuropathy of left upper extremity   [place], Devonne Doughty, DO

## 2021-02-28 NOTE — Telephone Encounter (Signed)
Sent referral to [facility].  [name], [facility] 02/28/2021, 4:56 PM

## 2021-03-06 ENCOUNTER — Telehealth: Payer: Self-pay

## 2021-03-06 ENCOUNTER — Other Ambulatory Visit: Payer: Self-pay | Admitting: Obstetrics & Gynecology

## 2021-03-06 MED ORDER — GABAPENTIN 300 MG PO CAPS: 300.0000 mg | ORAL_CAPSULE | Freq: Three times a day (TID) | ORAL | 0 refills | Status: DC

## 2021-03-06 NOTE — Telephone Encounter (Signed)
Pt wants a refill on gabapentin, Can you refill or does she need an appointment?

## 2021-03-06 NOTE — Telephone Encounter (Signed)
Needs appt  one refill gv

## 2021-03-15 ENCOUNTER — Other Ambulatory Visit: Payer: Self-pay | Admitting: Family Medicine

## 2021-03-15 DIAGNOSIS — M159 Polyosteoarthritis, unspecified: Secondary | ICD-10-CM

## 2021-03-16 NOTE — Telephone Encounter (Signed)
Requested medication (s) are due for refill today: yes  Requested medication (s) are on the active medication list: yes  Last refill:  02/16/21  Future visit scheduled: no  Notes to clinic:  Failed protocol of labs within 360 days, (09/2019)  no upcoming appt, reached max refills on last rx, please assess.   Requested Prescriptions  Pending Prescriptions Disp Refills   meloxicam (MOBIC) 15 MG tablet [Pharmacy Med Name: Meloxicam 15MG  TABS] 30 tablet 2    Sig: TAKE 1 TABLET BY MOUTH DAILY AS NEEDED FOR PAIN     Analgesics:  COX2 Inhibitors Failed - 03/15/2021  1:46 PM      Failed - HGB in normal range and within 360 days    Hemoglobin  Date Value Ref Range Status  09/30/2019 14.7 11.7 - 15.5 g/dL Final   HGB  Date Value Ref Range Status  04/20/2012 16.7 (H) 12.0 - 16.0 g/dL Final          Failed - Cr in normal range and within 360 days    Creat  Date Value Ref Range Status  09/30/2019 0.83 0.50 - 1.05 mg/dL Final    Comment:    For patients >57 years of age, the reference limit for Creatinine is approximately 13% higher for people identified as African-American. Renella Cunas - Patient is not pregnant      Passed - Valid encounter within last 12 months    Recent Outpatient Visits           1 month ago Essential hypertension   [place], DO   4 months ago Primary osteoarthritis involving multiple joints   [place], DO   8 months ago Controlled type 2 diabetes mellitus with complication, without long-term current use of insulin [facility])   [place], DO   11 months ago Psychophysiological insomnia   [place], DO   1 year ago Neuropathy of left upper extremity   [place], DO       Future Appointments             In 5 months Ralene Bathe, MD  [place]

## 2021-03-19 ENCOUNTER — Ambulatory Visit: Payer: Medicaid Other | Admitting: Urology

## 2021-03-23 ENCOUNTER — Telehealth: Payer: Self-pay | Admitting: *Deleted

## 2021-03-23 DIAGNOSIS — N3281 Overactive bladder: Secondary | ICD-10-CM

## 2021-03-23 NOTE — Telephone Encounter (Signed)
Faxed PA for Botox to [employer]

## 2021-03-26 ENCOUNTER — Ambulatory Visit: Payer: Medicaid Other | Admitting: Obstetrics & Gynecology

## 2021-04-04 ENCOUNTER — Other Ambulatory Visit: Payer: Self-pay | Admitting: Family Medicine

## 2021-04-04 DIAGNOSIS — J410 Simple chronic bronchitis: Secondary | ICD-10-CM

## 2021-04-04 DIAGNOSIS — M159 Polyosteoarthritis, unspecified: Secondary | ICD-10-CM

## 2021-04-04 MED ORDER — CIPROFLOXACIN HCL 500 MG PO TABS: ORAL_TABLET | ORAL | 0 refills | Status: DC

## 2021-04-04 NOTE — Telephone Encounter (Signed)
Requesting too soon. Requested Prescriptions  Pending Prescriptions Disp Refills   albuterol (VENTOLIN HFA) 108 (90 Base) MCG/ACT inhaler [Pharmacy Med Name: Ventolin HFA 108 (90 Base)MCG/ACT AERS] 18 g     Sig: INHALE 2 PUFFS INTO LUNGS EVERY FOUR HOURS AS NEEDED FOR SHORTNESS OF BREATH     Pulmonology:  Beta Agonists Failed - 04/04/2021  3:14 PM      Failed - One inhaler should last at least one month. If the patient is requesting refills earlier, contact the patient to check for uncontrolled symptoms.      Passed - Valid encounter within last 12 months    Recent Outpatient Visits          1 month ago Essential hypertension   [place], DO   4 months ago Primary osteoarthritis involving multiple joints   [place], DO   9 months ago Controlled type 2 diabetes mellitus with complication, without long-term current use of insulin ([place])   [facility], Devonne Doughty, DO   1 year ago Psychophysiological insomnia   [facility] Olin Hauser, DO   1 year ago Neuropathy of left upper extremity   [place], DO      Future Appointments            In 4 months Ralene Bathe, MD [place]            diclofenac Sodium (VOLTAREN) 1 % GEL [Pharmacy Med Name: Diclofenac Sodium 1% GEL] 100 g 3    Sig: APPLY 2G TOPICALLY FOUR TIMES A DAY AS NEEDED FOR ARTHRITIS     Analgesics:  Topicals Passed - 04/04/2021  3:14 PM      Passed - Valid encounter within last 12 months    Recent Outpatient Visits          1 month ago Essential hypertension   [place], DO   4 months ago Primary osteoarthritis involving multiple joints   [place], DO   9 months ago Controlled type 2 diabetes mellitus with complication, without  long-term current use of insulin [facility])   [facility] [place], Devonne Doughty, DO   1 year ago Psychophysiological insomnia   [place], DO   1 year ago Neuropathy of left upper extremity   [place], DO      Future Appointments            In 4 months Ralene Bathe, MD [place]

## 2021-04-04 NOTE — Telephone Encounter (Signed)
Received PA from [employer]-NO PA required for procedure. Faxed PA for Botox to pharmacy benefits.

## 2021-04-04 NOTE — Telephone Encounter (Signed)
Requested medication (s) are due for refill today: no, requesting early  Requested medication (s) are on the active medication list: yes  Last refill:  03/19/21  Future visit scheduled: no  Notes to clinic:  protocol dictates to notify MD if requesting inhaler earlier than one month, please assess.   Requested Prescriptions  Pending Prescriptions Disp Refills   albuterol (VENTOLIN HFA) 108 (90 Base) MCG/ACT inhaler 18 g     Sig: INHALE 2 PUFFS INTO LUNGS EVERY FOUR HOURS AS NEEDED FOR SHORTNESS OF BREATH     Pulmonology:  Beta Agonists Failed - 04/04/2021  3:14 PM      Failed - One inhaler should last at least one month. If the patient is requesting refills earlier, contact the patient to check for uncontrolled symptoms.      Passed - Valid encounter within last 12 months    Recent Outpatient Visits           1 month ago Essential hypertension   [place], DO   4 months ago Primary osteoarthritis involving multiple joints   [place], DO   9 months ago Controlled type 2 diabetes mellitus with complication, without long-term current use of insulin ([place])   [facility] [place], Devonne Doughty, DO   1 year ago Psychophysiological insomnia   [facility] Olin Hauser, DO   1 year ago Neuropathy of left upper extremity   [place], DO       Future Appointments             In 4 months Ralene Bathe, MD [place]            Refused Prescriptions Disp Refills   diclofenac Sodium (VOLTAREN) 1 % GEL [Pharmacy Med Name: Diclofenac Sodium 1% GEL] 100 g 3    Sig: APPLY 2G TOPICALLY FOUR TIMES A DAY AS NEEDED FOR ARTHRITIS     Analgesics:  Topicals Passed - 04/04/2021  3:14 PM      Passed - Valid encounter within last 12 months    Recent Outpatient Visits           1 month ago Essential  hypertension   [place], DO   4 months ago Primary osteoarthritis involving multiple joints   [place], DO   9 months ago Controlled type 2 diabetes mellitus with complication, without long-term current use of insulin [facility])   [facility] [place], Devonne Doughty, DO   1 year ago Psychophysiological insomnia   [place], DO   1 year ago Neuropathy of left upper extremity   [place], DO       Future Appointments             In 4 months Ralene Bathe, MD [place]

## 2021-04-05 ENCOUNTER — Other Ambulatory Visit: Payer: Self-pay | Admitting: Family Medicine

## 2021-04-05 DIAGNOSIS — E118 Type 2 diabetes mellitus with unspecified complications: Secondary | ICD-10-CM

## 2021-04-05 NOTE — Telephone Encounter (Signed)
Requested medication (s) are due for refill today: yes  Requested medication (s) are on the active medication list: yes  Last refill:  12/22/20  Future visit scheduled: no  Notes to clinic:  Unable to refill per protocol due to failed labs, no updated results.      Requested Prescriptions  Pending Prescriptions Disp Refills   [place] 2 MG/0.85ML [place] [Pharmacy Med Name: Bydureon BCise 2MG/0.85ML AUIJ] 3.4 mL 3    Sig: INJECT 2 MG INTO THE SKIN ONCE A WEEK.     Endocrinology:  Diabetes - GLP-1 Receptor Agonists - exenatide Failed - 04/05/2021 10:07 AM      Failed - HBA1C is between 0 and 7.9 and within 180 days    Hemoglobin A1C  Date Value Ref Range Status  06/29/2020 5.2 4.0 - 5.6 % Final   Hgb A1c MFr Bld  Date Value Ref Range Status  11/24/2018 6.0 (H) <5.7 % of total Hgb Final    Comment:    For someone without known diabetes, a hemoglobin  A1c value between 5.7% and 6.4% is consistent with prediabetes and should be confirmed with a  follow-up test. . For someone with known diabetes, a value <7% indicates that their diabetes is well controlled. A1c targets should be individualized based on duration of diabetes, age, comorbid conditions, and other considerations. . This assay result is consistent with an increased risk of diabetes. . Currently, no consensus exists regarding use of hemoglobin A1c for diagnosis of diabetes for children. .           Failed - Cr in normal range and within 360 days    Creat  Date Value Ref Range Status  09/30/2019 0.83 0.50 - 1.05 mg/dL Final    Comment:    For patients >38 years of age, the reference limit for Creatinine is approximately 13% higher for people identified as African-American. .           Failed - eGFR in normal range and within 360 days    GFR, Est African American  Date Value Ref Range Status  09/30/2019 93 > OR = 60 mL/min/1.37m Final   GFR, Est Non African American  Date Value Ref Range  Status  09/30/2019 81 > OR = 60 mL/min/1.769mFinal          Passed - Valid encounter within last 6 months    Recent Outpatient Visits           1 month ago Essential hypertension   So[place]DO   4 months ago Primary osteoarthritis involving multiple joints   So[place]in[place]DO   9 months ago Controlled type 2 diabetes mellitus with complication, [facility]   1 year ago Psychophysiological insomnia   [place]ight[place]   1 year ago Neuropathy of left upper extremity   SoEstillDO       Future Appointments             In 4 months KoRalene BatheMD [place]

## 2021-04-09 ENCOUNTER — Other Ambulatory Visit: Payer: Self-pay | Admitting: Family Medicine

## 2021-04-09 NOTE — Telephone Encounter (Signed)
Requested Prescriptions  Pending Prescriptions Disp Refills   valACYclovir (VALTREX) 1000 MG tablet [Pharmacy Med Name: valACYclovir HCl 1GM TABS*] 28 tablet 3    Sig: TAKE 1 TABLET BY MOUTH DAILY     Antimicrobials:  Antiviral Agents - Anti-Herpetic Passed - 04/09/2021 10:20 AM      Passed - Valid encounter within last 12 months    Recent Outpatient Visits          2 months ago Essential hypertension   [place], DO   5 months ago Primary osteoarthritis involving multiple joints   [place], DO   9 months ago Controlled type 2 diabetes mellitus with complication, without long-term current use of insulin [facility])   [place], DO   1 year ago Psychophysiological insomnia   [place], DO   1 year ago Neuropathy of left upper extremity   [place], DO      Future Appointments            In 4 months Ralene Bathe, MD [place]

## 2021-04-11 ENCOUNTER — Other Ambulatory Visit: Payer: Self-pay | Admitting: Family Medicine

## 2021-04-11 ENCOUNTER — Other Ambulatory Visit: Payer: Self-pay | Admitting: Obstetrics & Gynecology

## 2021-04-11 DIAGNOSIS — J432 Centrilobular emphysema: Secondary | ICD-10-CM

## 2021-04-11 DIAGNOSIS — J410 Simple chronic bronchitis: Secondary | ICD-10-CM

## 2021-04-11 NOTE — Telephone Encounter (Signed)
Duplicate request. Requested Prescriptions  Pending Prescriptions Disp Refills   albuterol (VENTOLIN HFA) 108 (90 Base) MCG/ACT inhaler 18 g 1    Sig: INHALE 2 PUFFS INTO LUNGS EVERY FOUR HOURS AS NEEDED FOR SHORTNESS OF BREATH     Pulmonology:  Beta Agonists 2 Failed - 04/11/2021  3:31 PM      Failed - Last BP in normal range    BP Readings from Last 1 Encounters:  02/06/21 (!) 159/94         Passed - Last Heart Rate in normal range    Pulse Readings from Last 1 Encounters:  02/06/21 84         Passed - Valid encounter within last 12 months    Recent Outpatient Visits          2 months ago Essential hypertension   [place], DO   5 months ago Primary osteoarthritis involving multiple joints   [place], DO   9 months ago Controlled type 2 diabetes mellitus with complication, without long-term current use of insulin [facility])   [place], DO   1 year ago Psychophysiological insomnia   [place], DO   1 year ago Neuropathy of left upper extremity   [place], DO      Future Appointments            In 4 months Ralene Bathe, MD [place] 160-4.5 MCG/ACT inhaler [Pharmacy Med Name: Symbicort 160-4.5MCG/ACT AERO] 10.2 g 1    Sig: INHALE 2 PUFFS EVERY MORNING AND EVERY EVENING     Pulmonology:  Combination Products Passed - 04/11/2021  3:31 PM      Passed - Valid encounter within last 12 months    Recent Outpatient Visits          2 months ago Essential hypertension   [place], DO   5 months ago Primary osteoarthritis involving multiple joints   [place], DO   9 months ago Controlled type 2 diabetes mellitus with complication, without  long-term current use of insulin [facility])   [place], DO   1 year ago Psychophysiological insomnia   [place], DO   1 year ago Neuropathy of left upper extremity   [place], DO      Future Appointments            In 4 months Ralene Bathe, MD [place]

## 2021-04-11 NOTE — Telephone Encounter (Signed)
Requested medication (s) are due for refill today: requesting early  Requested medication (s) are on the active medication list: yes  Last refill:  02/19/21 10.2g with 1 RF  Future visit scheduled: no  Notes to clinic:  requesting inhaler early, please assess.  Requested Prescriptions  Pending Prescriptions Disp Refills   SYMBICORT 160-4.5 MCG/ACT inhaler [Pharmacy Med Name: Symbicort 160-4.5MCG/ACT AERO] 10.2 g 1    Sig: INHALE 2 PUFFS EVERY MORNING AND EVERY EVENING     Pulmonology:  Combination Products Passed - 04/11/2021  3:31 PM      Passed - Valid encounter within last 12 months    Recent Outpatient Visits           2 months ago Essential hypertension   [place], DO   5 months ago Primary osteoarthritis involving multiple joints   [place], DO   9 months ago Controlled type 2 diabetes mellitus with complication, without long-term current use of insulin ([place])   [facility] [place], Devonne Doughty, DO   1 year ago Psychophysiological insomnia   [facility] Olin Hauser, DO   1 year ago Neuropathy of left upper extremity   [place], DO       Future Appointments             In 4 months Ralene Bathe, MD [place]            Refused Prescriptions Disp Refills   albuterol (VENTOLIN HFA) 108 (90 Base) MCG/ACT inhaler 18 g 1    Sig: INHALE 2 PUFFS INTO LUNGS EVERY FOUR HOURS AS NEEDED FOR SHORTNESS OF BREATH     Pulmonology:  Beta Agonists 2 Failed - 04/11/2021  3:31 PM      Failed - Last BP in normal range    BP Readings from Last 1 Encounters:  02/06/21 (!) 159/94          Passed - Last Heart Rate in normal range    Pulse Readings from Last 1 Encounters:  02/06/21 84          Passed - Valid encounter within last 12 months    Recent Outpatient Visits           2  months ago Essential hypertension   [place], DO   5 months ago Primary osteoarthritis involving multiple joints   [place], DO   9 months ago Controlled type 2 diabetes mellitus with complication, without long-term current use of insulin [facility])   [facility] [place], Devonne Doughty, DO   1 year ago Psychophysiological insomnia   [place], DO   1 year ago Neuropathy of left upper extremity   [place], DO       Future Appointments             In 4 months Ralene Bathe, MD [place]

## 2021-04-16 ENCOUNTER — Other Ambulatory Visit: Payer: Self-pay

## 2021-04-16 ENCOUNTER — Other Ambulatory Visit: Payer: Medicaid Other

## 2021-04-16 DIAGNOSIS — N3281 Overactive bladder: Secondary | ICD-10-CM

## 2021-04-17 ENCOUNTER — Other Ambulatory Visit (HOSPITAL_COMMUNITY): Payer: Self-pay | Admitting: Physician Assistant

## 2021-04-17 ENCOUNTER — Other Ambulatory Visit: Payer: Self-pay | Admitting: Physician Assistant

## 2021-04-17 DIAGNOSIS — R519 Headache, unspecified: Secondary | ICD-10-CM

## 2021-04-17 DIAGNOSIS — R200 Anesthesia of skin: Secondary | ICD-10-CM

## 2021-04-17 DIAGNOSIS — G479 Sleep disorder, unspecified: Secondary | ICD-10-CM

## 2021-04-17 DIAGNOSIS — R202 Paresthesia of skin: Secondary | ICD-10-CM

## 2021-04-17 DIAGNOSIS — F39 Unspecified mood [affective] disorder: Secondary | ICD-10-CM

## 2021-04-17 DIAGNOSIS — R413 Other amnesia: Secondary | ICD-10-CM

## 2021-04-17 LAB — URINALYSIS, COMPLETE
Bilirubin, UA: NEGATIVE
Glucose, UA: NEGATIVE
Leukocytes,UA: NEGATIVE
Nitrite, UA: NEGATIVE
RBC, UA: NEGATIVE
Specific Gravity, UA: 1.02 (ref 1.005–1.030)
Urobilinogen, Ur: 2 mg/dL — ABNORMAL HIGH (ref 0.2–1.0)
pH, UA: 7 (ref 5.0–7.5)

## 2021-04-17 LAB — MICROSCOPIC EXAMINATION
Bacteria, UA: NONE SEEN
RBC, Urine: NONE SEEN /hpf (ref 0–2)

## 2021-04-19 LAB — CULTURE, URINE COMPREHENSIVE

## 2021-04-22 ENCOUNTER — Ambulatory Visit
Admission: RE | Admit: 2021-04-22 | Discharge: 2021-04-22 | Disposition: A | Discharge status: Home / Self Care | Payer: Medicaid Other | Source: Ambulatory Visit | Attending: Physician Assistant | Admitting: Physician Assistant

## 2021-04-22 DIAGNOSIS — R519 Headache, unspecified: Secondary | ICD-10-CM | POA: Diagnosis present

## 2021-04-22 DIAGNOSIS — G479 Sleep disorder, unspecified: Secondary | ICD-10-CM

## 2021-04-22 DIAGNOSIS — R413 Other amnesia: Secondary | ICD-10-CM | POA: Diagnosis not present

## 2021-04-22 DIAGNOSIS — R200 Anesthesia of skin: Secondary | ICD-10-CM

## 2021-04-22 DIAGNOSIS — F39 Unspecified mood [affective] disorder: Secondary | ICD-10-CM | POA: Diagnosis present

## 2021-04-22 DIAGNOSIS — R202 Paresthesia of skin: Secondary | ICD-10-CM

## 2021-04-30 ENCOUNTER — Ambulatory Visit: Payer: Medicaid Other | Admitting: Urology

## 2021-05-07 ENCOUNTER — Other Ambulatory Visit: Payer: Self-pay | Admitting: Family Medicine

## 2021-05-07 DIAGNOSIS — R110 Nausea: Secondary | ICD-10-CM

## 2021-05-08 NOTE — Telephone Encounter (Signed)
Requested medications are due for refill today.  yes  Requested medications are on the active medications list.  yes  Last refill. 02/28/2021 #30 2 refills  Future visit scheduled.   no  Notes to clinic.  Medication not delegated - Pharmacy requesting follow up.    Requested Prescriptions  Pending Prescriptions Disp Refills   promethazine (PHENERGAN) 25 MG tablet [Pharmacy Med Name: Promethazine HCl 25MG  TABS] 30 tablet 2    Sig: TAKE 1 TABLET EVERY SIX HOURS AS NEEDED NAUSEA     Not Delegated - Gastroenterology: Antiemetics Failed - 05/07/2021  2:23 PM      Failed - This refill cannot be delegated      Passed - Valid encounter within last 6 months    Recent Outpatient Visits           3 months ago Essential hypertension   [place], DO   6 months ago Primary osteoarthritis involving multiple joints   [place], DO   10 months ago Controlled type 2 diabetes mellitus with complication, without long-term current use of insulin [facility])   [place], DO   1 year ago Psychophysiological insomnia   [place], DO   1 year ago Neuropathy of left upper extremity   [place], DO       Future Appointments             In 3 months Ralene Bathe, MD [place]

## 2021-05-30 ENCOUNTER — Other Ambulatory Visit: Payer: Self-pay | Admitting: Family Medicine

## 2021-05-30 DIAGNOSIS — K219 Gastro-esophageal reflux disease without esophagitis: Secondary | ICD-10-CM

## 2021-05-31 ENCOUNTER — Other Ambulatory Visit: Payer: Self-pay | Admitting: *Deleted

## 2021-05-31 ENCOUNTER — Other Ambulatory Visit: Payer: Self-pay

## 2021-05-31 ENCOUNTER — Other Ambulatory Visit: Payer: Medicaid Other

## 2021-05-31 DIAGNOSIS — N3281 Overactive bladder: Secondary | ICD-10-CM

## 2021-05-31 DIAGNOSIS — N3946 Mixed incontinence: Secondary | ICD-10-CM

## 2021-05-31 LAB — MICROSCOPIC EXAMINATION

## 2021-05-31 LAB — URINALYSIS, COMPLETE
Bilirubin, UA: NEGATIVE
Glucose, UA: NEGATIVE
Ketones, UA: NEGATIVE
Leukocytes,UA: NEGATIVE
Nitrite, UA: NEGATIVE
Protein,UA: NEGATIVE
RBC, UA: NEGATIVE
Specific Gravity, UA: 1.02 (ref 1.005–1.030)
Urobilinogen, Ur: 0.2 mg/dL (ref 0.2–1.0)
pH, UA: 5.5 (ref 5.0–7.5)

## 2021-05-31 NOTE — Addendum Note (Signed)
Addended by: Verlene Mayer A on: 05/31/2021 08:13 AM ? ? Modules accepted: Orders ? ?

## 2021-06-01 NOTE — Telephone Encounter (Signed)
Requested medication (s) are due for refill today:   Yes ? ?Requested medication (s) are on the active medication list:   Yes ? ?Future visit scheduled:   No ? ? ?Last ordered: 02/06/2021 #40, 2 refills ? ?Max number of refills have been reached  ? ?Requested Prescriptions  ?Pending Prescriptions Disp Refills  ? sucralfate (CARAFATE) 1 g tablet [Pharmacy Med Name: Sucralfate 1GM TABS] 40 tablet 2  ?  Sig: TAKE 1 TABLET FOUR TIMES A DAY WITH MEALS AND AT BEDTIME AS NEEDED ONLY  ?  ? Gastroenterology: Antiacids Passed - 05/30/2021  4:46 PM  ?  ?  Passed - Valid encounter within last 12 months  ?  Recent Outpatient Visits   ? ?      ? 3 months ago Essential hypertension  ? [place], DO  ? 6 months ago Primary osteoarthritis involving multiple joints  ? Miguel Barrera, DO  ? 11 months ago Controlled type 2 diabetes mellitus with complication, without long-term current use of insulin ([place])  ? [place], DO  ? 1 year ago Psychophysiological insomnia  ? [place], DO  ? 1 year ago Neuropathy of left upper extremity  ? [place], DO  ? ?  ?  ?Future Appointments   ? ?        ? In 2 months Ralene Bathe, MD [place]  ? ?  ? ?  ?  ?  ? ?

## 2021-06-04 ENCOUNTER — Other Ambulatory Visit: Payer: Self-pay | Admitting: Family Medicine

## 2021-06-04 DIAGNOSIS — J410 Simple chronic bronchitis: Secondary | ICD-10-CM

## 2021-06-04 DIAGNOSIS — M159 Polyosteoarthritis, unspecified: Secondary | ICD-10-CM

## 2021-06-04 LAB — CULTURE, URINE COMPREHENSIVE

## 2021-06-05 NOTE — Telephone Encounter (Signed)
Requested medication (s) are due for refill today: yes ? ?Requested medication (s) are on the active medication list: yes ? ?Last refill:  meloxicam 03/16/21 #30/2, zanaflex 12/19/20 #60/5 ? ?Future visit scheduled: no ? ?Notes to clinic:  meloxicam needing updated labs and zanaflex is not delegated.  ? ? ?  ?Requested Prescriptions  ?Pending Prescriptions Disp Refills  ? meloxicam (MOBIC) 15 MG tablet [Pharmacy Med Name: Meloxicam 15MG TABS] 30 tablet 2  ?  Sig: TAKE 1 TABLET BY MOUTH AS NEEDED FOR PAIN  ?  ? Analgesics:  COX2 Inhibitors Failed - 06/04/2021  4:44 PM  ?  ?  Failed - Manual Review: Labs are only required if the patient has taken medication for more than 8 weeks.  ?  ?  Failed - HGB in normal range and within 360 days  ?  Hemoglobin  ?Date Value Ref Range Status  ?09/30/2019 14.7 11.7 - 15.5 g/dL Final  ? ?HGB  ?Date Value Ref Range Status  ?04/20/2012 16.7 (H) 12.0 - 16.0 g/dL Final  ?  ?  ?  ?  Failed - Cr in normal range and within 360 days  ?  Creat  ?Date Value Ref Range Status  ?09/30/2019 0.83 0.50 - 1.05 mg/dL Final  ?  Comment:  ?  For patients >19 years of age, the reference limit ?for Creatinine is approximately 13% higher for people ?identified as African-American. ?. ?  ?  ?  ?  ?  Failed - HCT in normal range and within 360 days  ?  HCT  ?Date Value Ref Range Status  ?09/30/2019 43.8 35.0 - 45.0 % Final  ?04/20/2012 50.0 (H) 35.0 - 47.0 % Final  ?  ?  ?  ?  Failed - AST in normal range and within 360 days  ?  AST  ?Date Value Ref Range Status  ?09/30/2019 14 10 - 35 U/L Final  ? ?SGOT(AST)  ?Date Value Ref Range Status  ?04/20/2012 26 15 - 37 Unit/L Final  ?  ?  ?  ?  Failed - ALT in normal range and within 360 days  ?  ALT  ?Date Value Ref Range Status  ?09/30/2019 14 6 - 29 U/L Final  ? ?SGPT (ALT)  ?Date Value Ref Range Status  ?04/20/2012 39 12 - 78 U/L Final  ?  ?  ?  ?  Failed - eGFR is 30 or above and within 360 days  ?  GFR, Est African American  ?Date Value Ref Range Status   ?09/30/2019 93 > OR = 60 mL/min/1.15m Final  ? ?GFR, Est Non African American  ?Date Value Ref Range Status  ?09/30/2019 81 > OR = 60 mL/min/1.773mFinal  ?  ?  ?  ?  Passed - Patient is not pregnant  ?  ?  Passed - Valid encounter within last 12 months  ?  Recent Outpatient Visits   ? ?      ? 3 months ago Essential hypertension  ? SoValenciaDO  ? 6 months ago Primary osteoarthritis involving multiple joints  ? SoEl Comb[place]DO  ? 11 months ago Controlled type 2 diabetes mellitus with complication, without long-term current use of insulin ([facility] ? SoEdenDO  ? 1 year ago Psychophysiological insomnia  ? SoNor[place]DO  ? 1 year ago Neuropathy of left upper extremity  ?  [place], DO  ? ?  ?  ?Future Appointments   ? ?        ? In 2 months Ralene Bathe, MD [place]  ? ?  ? ?  ?  ?  ? tiZANidine (ZANAFLEX) 2 MG tablet [Pharmacy Med Name: tiZANidine HCl 2MG TABS*] 60 tablet 5  ?  Sig: TAKE 1 TABLET THREE TIMES A DAY AS NEEDED MUSCLE SPASMS  ?  ? Not Delegated - Cardiovascular:  Alpha-2 Agonists - tizanidine Failed - 06/04/2021  4:44 PM  ?  ?  Failed - This refill cannot be delegated  ?  ?  Passed - Valid encounter within last 6 months  ?  Recent Outpatient Visits   ? ?      ? 3 months ago Essential hypertension  ? [place], DO  ? 6 months ago Primary osteoarthritis involving multiple joints  ? [place], DO  ? 11 months ago Controlled type 2 diabetes mellitus with complication, without long-term current use of insulin ([place])  ? [place], DO  ? 1 year ago Psychophysiological insomnia  ? [place], DO  ? 1 year ago  Neuropathy of left upper extremity  ? [place], DO  ? ?  ?  ?Future Appointments   ? ?        ? In 2 months Ralene Bathe, MD [place]  ? ?  ? ?  ?  ?  ?Signed Prescriptions Disp Refills  ? SPIRIVA HANDIHALER 18 MCG inhalation capsule 30 capsule 2  ?  Sig: INHALE CONTENTS OF ONE CAPSULE ONCE DAILY USING HANDIHALER DEVICE  ?  ? Pulmonology:  Anticholinergic Agents Passed - 06/04/2021  4:44 PM  ?  ?  Passed - Valid encounter within last 12 months  ?  Recent Outpatient Visits   ? ?      ? 3 months ago Essential hypertension  ? [place], DO  ? 6 months ago Primary osteoarthritis involving multiple joints  ? [place], DO  ? 11 months ago Controlled type 2 diabetes mellitus with complication, without long-term current use of insulin ([place])  ? [place], DO  ? 1 year ago Psychophysiological insomnia  ? [place], DO  ? 1 year ago Neuropathy of left upper extremity  ? [place], DO  ? ?  ?  ?Future Appointments   ? ?        ? In 2 months Ralene Bathe, MD [place]  ? ?  ? ?  ?  ?  ? albuterol (VENTOLIN HFA) 108 (90 Base) MCG/ACT inhaler 18 g 2  ?  Sig: INHALE 2 PUFFS EVERY FOUR HOURS AS NEEDED  ?  ? Pulmonology:  Beta Agonists 2 Failed - 06/04/2021  4:44 PM  ?  ?  Failed - Last BP in normal range  ?  BP Readings from Last 1 Encounters:  ?02/06/21 (!) 159/94  ?  ?  ?  ?  Passed - Last Heart Rate in normal range  ?  Pulse Readings from Last 1 Encounters:  ?02/06/21 84  ?  ?  ?  ?  Passed -  Valid encounter within last 12 months  ?  Recent Outpatient Visits   ? ?      ? 3 months ago Essential hypertension  ? [place], DO  ? 6 months ago Primary osteoarthritis involving multiple joints  ? [place], DO  ? 11 months ago Controlled type 2 diabetes mellitus with complication, without long-term current use of insulin ([place])  ? [place], DO  ? 1 year ago Psychophysiological insomnia  ? [place], DO  ? 1 year ago Neuropathy of left upper extremity  ? [place], DO  ? ?  ?  ?Future Appointments   ? ?        ? In 2 months Ralene Bathe, MD [place]  ? ?  ? ?  ?  ?  ?s ?

## 2021-06-05 NOTE — Telephone Encounter (Signed)
Requested Prescriptions  ?Pending Prescriptions Disp Refills  ?? meloxicam (MOBIC) 15 MG tablet [Pharmacy Med Name: Meloxicam 15MG TABS] 30 tablet 2  ?  Sig: TAKE 1 TABLET BY MOUTH AS NEEDED FOR PAIN  ?  ? Analgesics:  COX2 Inhibitors Failed - 06/04/2021  4:44 PM  ?  ?  Failed - Manual Review: Labs are only required if the patient has taken medication for more than 8 weeks.  ?  ?  Failed - HGB in normal range and within 360 days  ?  Hemoglobin  ?Date Value Ref Range Status  ?09/30/2019 14.7 11.7 - 15.5 g/dL Final  ? ?HGB  ?Date Value Ref Range Status  ?04/20/2012 16.7 (H) 12.0 - 16.0 g/dL Final  ?   ?  ?  Failed - Cr in normal range and within 360 days  ?  Creat  ?Date Value Ref Range Status  ?09/30/2019 0.83 0.50 - 1.05 mg/dL Final  ?  Comment:  ?  For patients >52 years of age, the reference limit ?for Creatinine is approximately 13% higher for people ?identified as African-American. ?. ?  ?   ?  ?  Failed - HCT in normal range and within 360 days  ?  HCT  ?Date Value Ref Range Status  ?09/30/2019 43.8 35.0 - 45.0 % Final  ?04/20/2012 50.0 (H) 35.0 - 47.0 % Final  ?   ?  ?  Failed - AST in normal range and within 360 days  ?  AST  ?Date Value Ref Range Status  ?09/30/2019 14 10 - 35 U/L Final  ? ?SGOT(AST)  ?Date Value Ref Range Status  ?04/20/2012 26 15 - 37 Unit/L Final  ?   ?  ?  Failed - ALT in normal range and within 360 days  ?  ALT  ?Date Value Ref Range Status  ?09/30/2019 14 6 - 29 U/L Final  ? ?SGPT (ALT)  ?Date Value Ref Range Status  ?04/20/2012 39 12 - 78 U/L Final  ?   ?  ?  Failed - eGFR is 30 or above and within 360 days  ?  GFR, Est African American  ?Date Value Ref Range Status  ?09/30/2019 93 > OR = 60 mL/min/1.1m Final  ? ?GFR, Est Non African American  ?Date Value Ref Range Status  ?09/30/2019 81 > OR = 60 mL/min/1.734mFinal  ?   ?  ?  Passed - Patient is not pregnant  ?  ?  Passed - Valid encounter within last 12 months  ?  Recent Outpatient Visits   ?      ? 3 months ago Essential  hypertension  ? [place]  ? 6 months ago Primary osteoarthritis involving multiple joints  ? [place]  ? 11 months ago Controlled type 2 diabetes mellitus with complication, without long-term [place] use of insulin [place] ? [place]  ? 1 year ago Psychophysiological insomnia  ? [place]  ? 1 year ago Neuropathy of left upper extremity  ? [place]  ?  ?  ?Future Appointments   ?        ? In 2 months [place]alene [place]atheMD [place]?  ? ?  ?  ?  ?? SPIRIVA HANDIHALER 18 MCG inhalation capsule [Pharmacy Med Name: Spiriva HandiHaler 18MCG  CAPS] 30 capsule 2  ?  Sig: INHALE CONTENTS OF ONE CAPSULE ONCE DAILY USING HANDIHALER DEVICE  ?  ? Pulmonology:  Anticholinergic Agents Passed - 06/04/2021  4:44 PM  ?  ?  Passed - Valid encounter within last 12 months  ?  Recent Outpatient Visits   ?      ? 3 months ago Essential hypertension  ? [place], DO  ? 6 months ago Primary osteoarthritis involving multiple joints  ? [place], DO  ? 11 months ago Controlled type 2 diabetes mellitus with complication, without long-term current use of insulin ([place])  ? [place], DO  ? 1 year ago Psychophysiological insomnia  ? [place], DO  ? 1 year ago Neuropathy of left upper extremity  ? [place], DO  ?  ?  ?Future Appointments   ?        ? In 2 months Ralene Bathe, MD [place]  ?  ? ?  ?  ?  ?? tiZANidine (ZANAFLEX) 2 MG tablet [Pharmacy Med Name: tiZANidine HCl 2MG TABS*] 60 tablet 5  ?  Sig: TAKE 1 TABLET THREE TIMES A DAY AS NEEDED MUSCLE SPASMS  ?  ? Not  Delegated - Cardiovascular:  Alpha-2 Agonists - tizanidine Failed - 06/04/2021  4:44 PM  ?  ?  Failed - This refill cannot be delegated  ?  ?  Passed - Valid encounter within last 6 months  ?  Recent Outpatient Visits   ?      ? 3 months ago Essential hypertension  ? [place], DO  ? 6 months ago Primary osteoarthritis involving multiple joints  ? [place], DO  ? 11 months ago Controlled type 2 diabetes mellitus with complication, without long-term current use of insulin ([place])  ? [place], DO  ? 1 year ago Psychophysiological insomnia  ? [place], DO  ? 1 year ago Neuropathy of left upper extremity  ? [place], DO  ?  ?  ?Future Appointments   ?        ? In 2 months Ralene Bathe, MD [place]  ?  ? ?  ?  ?  ?? albuterol (VENTOLIN HFA) 108 (90 Base) MCG/ACT inhaler [Pharmacy Med Name: Ventolin HFA 108 (90 Base)MCG/ACT AERS] 18 g 2  ?  Sig: INHALE 2 PUFFS EVERY FOUR HOURS AS NEEDED  ?  ? Pulmonology:  Beta Agonists 2 Failed - 06/04/2021  4:44 PM  ?  ?  Failed - Last BP in normal range  ?  BP Readings from Last 1 Encounters:  ?02/06/21 (!) 159/94  ?   ?  ?  Passed - Last Heart Rate in normal range  ?  Pulse Readings from Last 1 Encounters:  ?02/06/21 84  ?   ?  ?  Passed - Valid encounter within last 12 months  ?  Recent Outpatient Visits   ?      ? 3 months ago Essential hypertension  ? [place], DO  ? 6 months ago Primary osteoarthritis involving multiple joints  ? [place], DO  ? 11 months  ago Controlled type 2 diabetes mellitus with complication, without long-term current use of insulin ([place])  ? [place], DO  ? 1 year ago Psychophysiological insomnia  ?  [place], DO  ? 1 year ago Neuropathy of left upper extremity  ? [place], DO  ?  ?  ?Future Appointments   ?        ? In 2 months Ralene Bathe, MD [place]  ?  ? ?  ?  ?  ? ? ?

## 2021-06-11 ENCOUNTER — Ambulatory Visit (INDEPENDENT_AMBULATORY_CARE_PROVIDER_SITE_OTHER): Payer: Medicaid Other | Admitting: Urology

## 2021-06-11 VITALS — BP 120/78 | HR 80

## 2021-06-11 DIAGNOSIS — N3281 Overactive bladder: Secondary | ICD-10-CM | POA: Diagnosis not present

## 2021-06-11 DIAGNOSIS — N3946 Mixed incontinence: Secondary | ICD-10-CM | POA: Diagnosis not present

## 2021-06-11 MED ORDER — ONABOTULINUMTOXINA 100 UNITS IJ SOLR
100.0000 [IU] | Freq: Once | INTRAMUSCULAR | Status: AC
  Administered 2021-06-11: 100 [IU] via INTRAMUSCULAR

## 2021-06-11 MED ORDER — LIDOCAINE HCL (PF) 2 % IJ SOLN: 50.0000 mL | Freq: Once | INTRAMUSCULAR | Status: AC

## 2021-06-11 MED ORDER — CIPROFLOXACIN HCL 250 MG PO TABS
250.0000 mg | ORAL_TABLET | Freq: Two times a day (BID) | ORAL | 0 refills | Status: AC
Start: 2021-06-11 — End: 2021-06-14

## 2021-06-11 NOTE — Progress Notes (Signed)
? ?06/11/2021 ?9:14 AM  ? ?Darlene Barber ?02-18-1966 ?[id number] ? ?Referring provider: Olin Hauser, DO ?[address] ?[place],   [address] ? ?Chief Complaint  ?Patient presents with  ? Botulinum Toxin Injection  ? ? ?HPI: ?Reviewed last note.  Her last Botox was 10 per 2020.  She has urge incontinence that is refractory to treatment with very little warning.  They can be high-volume.  She has some stress incontinence with bronchitis.  She does very well with Botox.  Frequency stable.  Incontinence stable.  Clinically not infected   ?  ?Today ?Last Botox was May 15, 2020.  She was doing well postoperatively and was emptying well ?Patient started to leak a little bit even with coughing sneezing and the Botox usually helps this.  Clinically not infected.  Frequency stable   ? ?Today ?Urgency incontinence persisting.  Frequency stable.  Clinically not infected. ? ?Cystoscopy: Patient underwent flexible cystoscopy.  Bladder mucosa and trigone were normal.  No cystitis.  No carcinoma.  Trigone identified.  I injected 100 units of Botox in 10 cc in normal saline.  I did 10 injection sites 1 cc/inj. site.  She was a bit tender but tolerated it well.  It was more in the midline based upon the flexible cystoscope and position of needle.  I did go lateral to the left and right as well. ? ? ?PMH: ?Past Medical History:  ?Diagnosis Date  ? Allergy   ? Anxiety   ? Bursitis of both hips   ? Depression   ? Frequent headaches   ? Obesity   ? Sleep apnea   ? doesn't use CPAP machine broken,   ? ? ?Surgical History: ?Past Surgical History:  ?Procedure Laterality Date  ? CESAREAN SECTION    ? x2  ? CHOLECYSTECTOMY    ? COLONOSCOPY WITH PROPOFOL N/A 11/20/2017  ? Procedure: COLONOSCOPY WITH PROPOFOL;  Surgeon: Lin Landsman, MD;  Location: [facility] ENDOSCOPY;  Service: Gastroenterology;  Laterality: N/A;  ? HYSTERECTOMY ABDOMINAL WITH SALPINGECTOMY  1998  ? HYSTEROSCOPY    ? ? ?Home Medications:  ?Allergies as of  06/11/2021   ? ?   Reactions  ? Piper Other (See Comments)  ? Black pepper Feels like throat is closing, itchy ?Feels like throat is closing, itchy ?Feels like throat is closing, itchy  ? Tape Rash  ? Paper tape is ok to use.  ? ?  ? ?  ?Medication List  ?  ? ?  ? Accurate as of June 11, 2021  9:14 AM. If you have any questions, ask your nurse or doctor.  ?  ?  ? ?  ? ?albuterol 108 (90 Base) MCG/ACT inhaler ?Commonly known as: Ventolin HFA ?INHALE 2 PUFFS EVERY FOUR HOURS AS NEEDED ?  ?amLODipine 10 MG tablet ?Commonly known as: NORVASC ?Take 1 tablet (10 mg total) by mouth daily. May start with half tablet 68m if preferred. ?  ?Bydureon BCise 2 MG/0.85ML Auij ?Generic drug: Exenatide ER ?INJECT 2 MG INTO THE SKIN ONCE A WEEK ?  ?clobetasol ointment 0.05 % ?Commonly known as: TEMOVATE ?APPLY TO AFFECTED AREAS TWICE DAILY UNTIL IMPROVED ?  ?cloNIDine 0.3 MG tablet ?Commonly known as: CATAPRES ?Take 0.3 mg by mouth 2 (two) times daily. ?  ?diclofenac Sodium 1 % Gel ?Commonly known as: VOLTAREN ?Apply 2 g topically 4 (four) times daily as needed (arthritis hand/thumb). ?  ?estradiol 1 MG tablet ?Commonly known as: ESTRACE ?Take 1 tablet (1 mg total)  by mouth daily. ?  ?fluticasone 50 MCG/ACT nasal spray ?Commonly known as: FLONASE ?1 SPARY IN EACH NOSTRIL DAILY ?  ?gabapentin 300 MG capsule ?Commonly known as: NEURONTIN ?Take 1 capsule (300 mg total) by mouth 3 (three) times daily. ?  ?hydrOXYzine 10 MG tablet ?Commonly known as: ATARAX ?Take 10 mg by mouth 3 (three) times daily. ?  ?linaclotide 145 MCG Caps capsule ?Commonly known as: LINZESS ?TAKE 1 CAPSULE (145 MCG TOTAL) BY MOUTH ONCE DAILY ?  ?meloxicam 15 MG tablet ?Commonly known as: MOBIC ?TAKE 1 TABLET BY MOUTH AS NEEDED FOR PAIN ?  ?nortriptyline 25 MG capsule ?Commonly known as: PAMELOR ?Take by mouth. ?  ?omeprazole 40 MG capsule ?Commonly known as: PRILOSEC ?Take 1 capsule (40 mg total) by mouth 2 (two) times daily before a meal. ?  ?Oxcarbazepine 300 MG  tablet ?Commonly known as: TRILEPTAL ?Take 300 mg by mouth 3 (three) times daily. ?  ?pregabalin 150 MG capsule ?Commonly known as: LYRICA ?Take 150 mg by mouth 3 (three) times daily. ?What changed: Another medication with the same name was removed. Continue taking this medication, and follow the directions you see here. ?Changed by: Reece Packer, MD ?  ?promethazine 25 MG tablet ?Commonly known as: PHENERGAN ?TAKE 1 TABLET EVERY SIX HOURS AS NEEDED NAUSEA ?  ?QUEtiapine 300 MG tablet ?Commonly known as: SEROQUEL ?Take 300 mg by mouth 2 (two) times daily. ?What changed: Another medication with the same name was removed. Continue taking this medication, and follow the directions you see here. ?Changed by: Reece Packer, MD ?  ?Spiriva HandiHaler 18 MCG inhalation capsule ?Generic drug: tiotropium ?INHALE CONTENTS OF ONE CAPSULE ONCE DAILY USING HANDIHALER DEVICE ?  ?sucralfate 1 g tablet ?Commonly known as: CARAFATE ?TAKE 1 TABLET FOUR TIMES A DAY WITH MEALS AND AT BEDTIME AS NEEDED ONLY ?  ?Symbicort 160-4.5 MCG/ACT inhaler ?Generic drug: budesonide-formoterol ?INHALE 2 PUFFS EVERY MORNING AND EVERY EVENING ?  ?terbinafine 250 MG tablet ?Commonly known as: LAMISIL ?Take 250 mg by mouth daily. ?  ?tiZANidine 2 MG tablet ?Commonly known as: ZANAFLEX ?TAKE 1 TABLET THREE TIMES A DAY AS NEEDED MUSCLE SPASMS ?  ?valACYclovir 1000 MG tablet ?Commonly known as: VALTREX ?TAKE 1 TABLET BY MOUTH DAILY ?  ?Viibryd Starter Pack 10 & 20 MG Kit ?Generic drug: Vilazodone HCl ?Take 1 tablet by mouth daily. ?  ?vitamin B-12 500 MCG tablet ?Commonly known as: CYANOCOBALAMIN ?Take 500 mcg by mouth daily. ?  ? ?  ? ? ?Allergies:  ?Allergies  ?Allergen Reactions  ? Piper Other (See Comments)  ?  Black pepper Feels like throat is closing, itchy ?Feels like throat is closing, itchy ?Feels like throat is closing, itchy  ? Tape Rash  ?  Paper tape is ok to use.  ? ? ?Family History: ?Family History  ?Problem Relation Age of Onset   ? Heart failure Maternal Grandmother   ? Alzheimer's disease Maternal Grandfather   ? Emphysema Paternal Grandmother   ? Hodgkin's lymphoma Mother   ? Diabetes Maternal Aunt   ? Diabetes Maternal Uncle   ? Cancer Other   ? Breast cancer Neg Hx   ? ? ?Social History:  reports that she has been smoking cigarettes. She has a 30.00 pack-year smoking history. She uses smokeless tobacco. She reports current alcohol use. She reports current drug use. Drug: Marijuana. ? ?ROS: ?  ? ?  ? ?  ? ?  ? ?  ? ?  ? ?  ? ?  ? ?  ? ?  ? ?  ? ?  ? ?  ? ?  Physical Exam: ?There were no vitals taken for this visit.  ?Constitutional:  Alert and oriented, No acute distress. ?HEENT: [place] AT, moist mucus membranes.  Trachea midline, no masses. ? ?Laboratory Data: ?Lab Results  ?Component Value Date  ? WBC 5.8 09/30/2019  ? HGB 14.7 09/30/2019  ? HCT 43.8 09/30/2019  ? MCV 99.5 09/30/2019  ? PLT 245 09/30/2019  ? ? ?Lab Results  ?Component Value Date  ? CREATININE 0.83 09/30/2019  ? ? ?No results found for: PSA ? ?No results found for: TESTOSTERONE ? ?Lab Results  ?Component Value Date  ? HGBA1C 5.2 06/29/2020  ? ? ?Urinalysis ?   ?Component Value Date/Time  ? COLORURINE Yellow 04/20/2012 1725  ? APPEARANCEUR Clear 05/31/2021 1426  ? LABSPEC 1.010 04/20/2012 1725  ? PHURINE 6.0 04/20/2012 1725  ? GLUCOSEU Negative 05/31/2021 1426  ? GLUCOSEU Negative 04/20/2012 1725  ? HGBUR Negative 04/20/2012 1725  ? BILIRUBINUR Negative 05/31/2021 1426  ? BILIRUBINUR Negative 04/20/2012 1725  ? KETONESUR Negative 04/20/2012 1725  ? PROTEINUR Negative 05/31/2021 1426  ? PROTEINUR Negative 04/20/2012 1725  ? NITRITE Negative 05/31/2021 1426  ? NITRITE Negative 04/20/2012 1725  ? LEUKOCYTESUR Negative 05/31/2021 1426  ? LEUKOCYTESUR Negative 04/20/2012 1725  ? ? ?Pertinent Imaging: ? ? ?Assessment & Plan: Patient has urgency incontinence treated with Botox.  She will take 3 days of ciprofloxacin and be followed as per protocol.  She clinically was not infected  today ? ?1. Mixed incontinence ? ? ?2. OAB (overactive bladder) ? ?- Urinalysis, Complete ? ? ?No follow-ups on file. ? ?Reece Packer, MD ? ?[place] ?[address] [employer]

## 2021-06-11 NOTE — Progress Notes (Signed)
Bladder Instillation ? ?Due to BOTOX patient is present today for a Bladder Instillation of BOTOX. Patient was cleaned and prepped in a sterile fashion with betadine and lidocaine 2% jelly was instilled into the urethra.  A 16 FR catheter was inserted, urine return was noted 25 ml, urine was yellow in color.  50 ml of liodocaine 2% was instilled into the bladder. The catheter was then removed. Patient tolerated well, no complications were noted ?Patient held in bladder for 30 minutes prior to procedure starting.  ? ?Performed by: Lesli Albee ? ?Follow up/ Additional notes: 2 weeks PVR.  ?

## 2021-06-12 ENCOUNTER — Ambulatory Visit (INDEPENDENT_AMBULATORY_CARE_PROVIDER_SITE_OTHER): Payer: Medicaid Other | Admitting: Physician Assistant

## 2021-06-12 ENCOUNTER — Encounter: Payer: Self-pay | Admitting: Physician Assistant

## 2021-06-12 VITALS — BP 109/72 | HR 98 | Ht <= 58 in | Wt 151.0 lb

## 2021-06-12 DIAGNOSIS — N3281 Overactive bladder: Secondary | ICD-10-CM

## 2021-06-12 DIAGNOSIS — R310 Gross hematuria: Secondary | ICD-10-CM | POA: Diagnosis not present

## 2021-06-12 LAB — URINALYSIS, COMPLETE
Bilirubin, UA: NEGATIVE
Nitrite, UA: NEGATIVE
Specific Gravity, UA: 1.015 (ref 1.005–1.030)
Urobilinogen, Ur: 4 mg/dL — ABNORMAL HIGH (ref 0.2–1.0)
pH, UA: 5.5 (ref 5.0–7.5)

## 2021-06-12 LAB — MICROSCOPIC EXAMINATION
Bacteria, UA: NONE SEEN
Epithelial Cells (non renal): NONE SEEN /hpf (ref 0–10)
RBC, Urine: >30 /hpf — AB (ref 0–2)

## 2021-06-12 LAB — BLADDER SCAN AMB NON-IMAGING: Scan Result: 0

## 2021-06-12 NOTE — Patient Instructions (Addendum)
Stay well hydrated and complete your Cipro antibiotic as prescribed. I expect your bleeding will get better in the next couple of days. ? ?If you develop any of the following, please call us back: ?-Thick, ketchup-like urine ?-Passing large blood clots, like the size of your palm ?-Can't urinate ?-Fever ?

## 2021-06-12 NOTE — Progress Notes (Signed)
? ?06/12/2021 ?2:50 PM  ? ?Darlene Barber ?09/04/1965 ?[id number] ? ?CC: ?Chief Complaint  ?Patient presents with  ? Hematuria  ? ?HPI: ?Darlene Barber is a 56 y.o. female with refractory OAB wet who underwent intravesical Botox with Dr. Matilde Sprang yesterday who presents today for evaluation of gross hematuria.  ? ?Today she reports significant gross hematuria since undergoing intravesical Botox yesterday.  She has passed some clot material but overall feels she has been able to empty her bladder well.  She has been taking prophylactic Cipro as prescribed. ? ?In-office UA today red in color and positive for trace glucose, 1+ ketones, 3+ blood, 3+ protein, and 3+ leukocyte esterase; urine microscopy with >30 RBCs/HPF. PVR 67mL. ? ?PMH: ?Past Medical History:  ?Diagnosis Date  ? Allergy   ? Anxiety   ? Bursitis of both hips   ? Depression   ? Frequent headaches   ? Obesity   ? Sleep apnea   ? doesn't use CPAP machine broken,   ? ? ?Surgical History: ?Past Surgical History:  ?Procedure Laterality Date  ? CESAREAN SECTION    ? x2  ? CHOLECYSTECTOMY    ? COLONOSCOPY WITH PROPOFOL N/A 11/20/2017  ? Procedure: COLONOSCOPY WITH PROPOFOL;  Surgeon: Lin Landsman, MD;  [facility] ENDOSCOPY;  Service: Gastroenterology;  Laterality: N/A;  ? HYSTERECTOMY ABDOMINAL WITH SALPINGECTOMY  1998  ? HYSTEROSCOPY    ? ? ?Home Medications:  ?Allergies as of 06/12/2021   ? ?   Reactions  ? Piper Other (See Comments)  ? Black pepper Feels like throat is closing, itchy ?Feels like throat is closing, itchy ?Feels like throat is closing, itchy  ? Tape Rash  ? Paper tape is ok to use.  ? ?  ? ?  ?Medication List  ?  ? ?  ? Accurate as of June 12, 2021  2:50 PM. If you have any questions, ask your nurse or doctor.  ?  ?  ? ?  ? ?albuterol 108 (90 Base) MCG/ACT inhaler ?Commonly known as: Ventolin HFA ?INHALE 2 PUFFS EVERY FOUR HOURS AS NEEDED ?  ?amLODipine 10 MG tablet ?Commonly known as: NORVASC ?Take 1 tablet (10 mg total) by mouth  daily. May start with half tablet $RemoveBef'5mg'[id number]$  if preferred. ?  ?Bydureon BCise 2 MG/0.85ML Auij ?Generic drug: Exenatide ER ?INJECT 2 MG INTO THE SKIN ONCE A WEEK ?  ?ciprofloxacin 250 MG tablet ?Commonly known as: Cipro ?Take 1 tablet (250 mg total) by mouth 2 (two) times daily for 3 days. ?  ?clobetasol ointment 0.05 % ?Commonly known as: TEMOVATE ?APPLY TO AFFECTED AREAS TWICE DAILY UNTIL IMPROVED ?  ?cloNIDine 0.3 MG tablet ?Commonly known as: CATAPRES ?Take 0.3 mg by mouth 2 (two) times daily. ?  ?diclofenac Sodium 1 % Gel ?Commonly known as: VOLTAREN ?Apply 2 g topically 4 (four) times daily as needed (arthritis hand/thumb). ?  ?estradiol 1 MG tablet ?Commonly known as: ESTRACE ?Take 1 tablet (1 mg total) by mouth daily. ?  ?fluticasone 50 MCG/ACT nasal spray ?Commonly known as: FLONASE ?1 SPARY IN EACH NOSTRIL DAILY ?  ?gabapentin 300 MG capsule ?Commonly known as: NEURONTIN ?Take 1 capsule (300 mg total) by mouth 3 (three) times daily. ?  ?hydrOXYzine 10 MG tablet ?Commonly known as: ATARAX ?Take 10 mg by mouth 3 (three) times daily. ?  ?linaclotide 145 MCG Caps capsule ?Commonly known as: LINZESS ?TAKE 1 CAPSULE (145 MCG TOTAL) BY MOUTH ONCE DAILY ?  ?meloxicam 15 MG tablet ?Commonly known as:  MOBIC ?TAKE 1 TABLET BY MOUTH AS NEEDED FOR PAIN ?  ?nortriptyline 25 MG capsule ?Commonly known as: PAMELOR ?Take by mouth. ?  ?omeprazole 40 MG capsule ?Commonly known as: PRILOSEC ?Take 1 capsule (40 mg total) by mouth 2 (two) times daily before a meal. ?  ?Oxcarbazepine 300 MG tablet ?Commonly known as: TRILEPTAL ?Take 300 mg by mouth 3 (three) times daily. ?  ?pregabalin 150 MG capsule ?Commonly known as: LYRICA ?Take 150 mg by mouth 3 (three) times daily. ?  ?promethazine 25 MG tablet ?Commonly known as: PHENERGAN ?TAKE 1 TABLET EVERY SIX HOURS AS NEEDED NAUSEA ?  ?QUEtiapine 300 MG tablet ?Commonly known as: SEROQUEL ?Take 300 mg by mouth 2 (two) times daily. ?  ?Spiriva HandiHaler 18 MCG inhalation capsule ?Generic  drug: tiotropium ?INHALE CONTENTS OF ONE CAPSULE ONCE DAILY USING HANDIHALER DEVICE ?  ?sucralfate 1 g tablet ?Commonly known as: CARAFATE ?TAKE 1 TABLET FOUR TIMES A DAY WITH MEALS AND AT BEDTIME AS NEEDED ONLY ?  ?Symbicort 160-4.5 MCG/ACT inhaler ?Generic drug: budesonide-formoterol ?INHALE 2 PUFFS EVERY MORNING AND EVERY EVENING ?  ?terbinafine 250 MG tablet ?Commonly known as: LAMISIL ?Take 250 mg by mouth daily. ?  ?tiZANidine 2 MG tablet ?Commonly known as: ZANAFLEX ?TAKE 1 TABLET THREE TIMES A DAY AS NEEDED MUSCLE SPASMS ?  ?valACYclovir 1000 MG tablet ?Commonly known as: VALTREX ?TAKE 1 TABLET BY MOUTH DAILY ?  ?Viibryd Starter Pack 10 & 20 MG Kit ?Generic drug: Vilazodone HCl ?Take 1 tablet by mouth daily. ?  ?vitamin B-12 500 MCG tablet ?Commonly known as: CYANOCOBALAMIN ?Take 500 mcg by mouth daily. ?  ? ?  ? ? ?Allergies:  ?Allergies  ?Allergen Reactions  ? Piper Other (See Comments)  ?  Black pepper Feels like throat is closing, itchy ?Feels like throat is closing, itchy ?Feels like throat is closing, itchy  ? Tape Rash  ?  Paper tape is ok to use.  ? ? ?Family History: ?Family History  ?Problem Relation Age of Onset  ? Heart failure Maternal Grandmother   ? Alzheimer's disease Maternal Grandfather   ? Emphysema Paternal Grandmother   ? Hodgkin's lymphoma Mother   ? Diabetes Maternal Aunt   ? Diabetes Maternal Uncle   ? Cancer Other   ? Breast cancer Neg Hx   ? ? ?Social History:  ? reports that she has been smoking cigarettes. She has a 30.00 pack-year smoking history. She uses smokeless tobacco. She reports current alcohol use. She reports current drug use. Drug: Marijuana. ? ?Physical Exam: ?BP 109/72   Pulse 98   Ht _0  (1.473 m)   Wt 151 lb (68.5 kg)   BMI 31.56 kg/m?   ?Constitutional:  Alert and oriented, no acute distress, nontoxic appearing ?HEENT: , AT ?Cardiovascular: No clubbing, cyanosis, or edema ?Respiratory: Normal respiratory effort, no increased work of breathing ?Skin: No  rashes, bruises or suspicious lesions ?Neurologic: Grossly intact, no focal deficits, moving all 4 extremities ?Psychiatric: Normal mood and affect ? ?Laboratory Data: ?Results for orders placed or performed in visit on 06/12/21  ?CULTURE, URINE COMPREHENSIVE  ? Specimen: Urine  ? UR  ?Result Value Ref Range  ? Urine Culture, Comprehensive Final report   ? Organism ID, Bacteria Comment   ?Microscopic Examination  ? Urine  ?Result Value Ref Range  ? WBC, UA 0-5 0 - 5 /hpf  ? RBC >30 (A) 0 - 2 /hpf  ? Epithelial Cells (non renal) None seen 0 - 10 /hpf  ?  Bacteria, UA None seen None seen/Few  ?Urinalysis, Complete  ?Result Value Ref Range  ? Specific Gravity, UA 1.015 1.005 - 1.030  ? pH, UA 5.5 5.0 - 7.5  ? Color, UA Red (A) Yellow  ? Appearance Ur Cloudy (A) Clear  ? Leukocytes,UA 3+ (A) Negative  ? Protein,UA 3+ (A) Negative/Trace  ? Glucose, UA Trace (A) Negative  ? Ketones, UA 1+ (A) Negative  ? RBC, UA 3+ (A) Negative  ? Bilirubin, UA Negative Negative  ? Urobilinogen, Ur 4.0 (H) 0.2 - 1.0 mg/dL  ? Nitrite, UA Negative Negative  ? Microscopic Examination See below:   ?Bladder Scan (Post Void Residual) in office  ?Result Value Ref Range  ? Scan Result 0   ? ?Bladder Irrigation ? ?Due to gross hematuria patient is present today for a bladder irrigation. Patient was cleaned and prepped in a sterile fashion with betadine. A 24Fr three-way hematuria catheter was inserted into the bladder and 10cc of sterile water was inflated into the balloon. 240 ml of saline was instilled and irrigated into the bladder with a 16m To[place]y syringe through the catheter in place.  Effluent was dark pink and about ccs of clot material were cleared from the bladder with this. Effluent cleared slightly with evacuation of clot material. 10ccs of water were then drained from the balloon and the catheter was removed from the bladder. Patient tolerated well.  ? ?Performed by: SDebroah Loop PA-C and CElberta Leatherwood CGranby? ?Assessment &  Plan:   ?1. Gross hematuria ?UA today notable for gross hematuria with low suspicion for urinary infection at this time.  I offered the patient a bladder irrigation in clinic to evacuate any possible retaine

## 2021-06-16 LAB — CULTURE, URINE COMPREHENSIVE

## 2021-06-25 ENCOUNTER — Ambulatory Visit (INDEPENDENT_AMBULATORY_CARE_PROVIDER_SITE_OTHER): Payer: Medicaid Other | Admitting: Physician Assistant

## 2021-06-25 ENCOUNTER — Encounter: Payer: Self-pay | Admitting: Physician Assistant

## 2021-06-25 VITALS — BP 127/82 | HR 93 | Ht <= 58 in | Wt 154.2 lb

## 2021-06-25 DIAGNOSIS — R310 Gross hematuria: Secondary | ICD-10-CM | POA: Diagnosis not present

## 2021-06-25 DIAGNOSIS — N3281 Overactive bladder: Secondary | ICD-10-CM

## 2021-06-25 LAB — BLADDER SCAN AMB NON-IMAGING

## 2021-06-25 NOTE — Progress Notes (Signed)
? ?06/25/2021 ?4:04 PM  ? ?Darlene Barber ?Oct 29, 1965 ?[id number] ? ?CC: ?Chief Complaint  ?Patient presents with  ? Follow-up  ? ?HPI: ?Darlene Barber is a 56 y.o. female with refractory OAB wet who underwent intravesical Botox with Dr. Matilde Sprang on 06/11/2021 who presents today for bladder scan.  Notably, I saw her in clinic on 06/12/2021 with gross hematuria following intravesical Botox. ? ?Today she reports her gross hematuria has resolved.  She feels she is emptying her bladder well and denies dysuria.  PVR 74m. ? ?PMH: ?Past Medical History:  ?Diagnosis Date  ? Allergy   ? Anxiety   ? Bursitis of both hips   ? Depression   ? Frequent headaches   ? Obesity   ? Sleep apnea   ? doesn't use CPAP machine broken,   ? ? ?Surgical History: ?Past Surgical History:  ?Procedure Laterality Date  ? CESAREAN SECTION    ? x2  ? CHOLECYSTECTOMY    ? COLONOSCOPY WITH PROPOFOL N/A 11/20/2017  ? Procedure: COLONOSCOPY WITH PROPOFOL;  Surgeon: VLin Landsman MD;  [facility];  Service: Gastroenterology;  Laterality: N/A;  ? HYSTERECTOMY ABDOMINAL WITH SALPINGECTOMY  1998  ? HYSTEROSCOPY    ? ? ?Home Medications:  ?Allergies as of 06/25/2021   ? ?   Reactions  ? [place]per Other (See Comments)  ? Black pepper Feels like throat is closing, itchy ?Feels like throat is closing, itchy ?Feels like throat is closing, itchy  ? Tape Rash  ? Paper tape is ok to use.  ? ?  ? ?  ?Medication List  ?  ? ?  ? Accurate as of June 25, 2021  4:04 PM. If you have any questions, ask your nurse or doctor.  ?  ?  ? ?  ? ?albuterol 108 (90 Base) MCG/ACT inhaler ?Commonly known as: Ventolin HFA ?INHALE 2 PUFFS EVERY FOUR HOURS AS NEEDED ?  ?amLODipine 10 MG tablet ?Commonly known as: NORVASC ?Take 1 tablet (10 mg total) by mouth daily. May start with half tablet 566mif preferred. ?  ?Bydureon BCise 2 MG/0.85ML Auij ?Generic drug: Exenatide ER ?INJECT 2 MG INTO THE SKIN ONCE A WEEK ?  ?clobetasol ointment 0.05 % ?Commonly known as:  TEMOVATE ?APPLY TO AFFECTED AREAS TWICE DAILY UNTIL IMPROVED ?  ?cloNIDine 0.3 MG tablet ?Commonly known as: CATAPRES ?Take 0.3 mg by mouth 2 (two) times daily. ?  ?diclofenac Sodium 1 % Gel ?Commonly known as: VOLTAREN ?Apply 2 g topically 4 (four) times daily as needed (arthritis hand/thumb). ?  ?estradiol 1 MG tablet ?Commonly known as: ESTRACE ?Take 1 tablet (1 mg total) by mouth daily. ?  ?fluticasone 50 MCG/ACT nasal spray ?Commonly known as: FLONASE ?1 SPARY IN EACH NOSTRIL DAILY ?  ?gabapentin 300 MG capsule ?Commonly known as: NEURONTIN ?Take 1 capsule (300 mg total) by mouth 3 (three) times daily. ?  ?hydrOXYzine 10 MG tablet ?Commonly known as: ATARAX ?Take 10 mg by mouth 3 (three) times daily. ?  ?linaclotide 145 MCG Caps capsule ?Commonly known as: LINZESS ?TAKE 1 CAPSULE (145 MCG TOTAL) BY MOUTH ONCE DAILY ?  ?meloxicam 15 MG tablet ?Commonly known as: MOBIC ?TAKE 1 TABLET BY MOUTH AS NEEDED FOR PAIN ?  ?nortriptyline 25 MG capsule ?Commonly known as: PAMELOR ?Take by mouth. ?  ?omeprazole 40 MG capsule ?Commonly known as: PRILOSEC ?Take 1 capsule (40 mg total) by mouth 2 (two) times daily before a meal. ?  ?Oxcarbazepine 300 MG tablet ?Commonly known as:  TRILEPTAL ?Take 300 mg by mouth 3 (three) times daily. ?  ?pregabalin 150 MG capsule ?Commonly known as: LYRICA ?Take 150 mg by mouth 3 (three) times daily. ?  ?promethazine 25 MG tablet ?Commonly known as: PHENERGAN ?TAKE 1 TABLET EVERY SIX HOURS AS NEEDED NAUSEA ?  ?QUEtiapine 300 MG tablet ?Commonly known as: SEROQUEL ?Take 300 mg by mouth 2 (two) times daily. ?  ?Spiriva HandiHaler 18 MCG inhalation capsule ?Generic drug: tiotropium ?INHALE CONTENTS OF ONE CAPSULE ONCE DAILY USING HANDIHALER DEVICE ?  ?sucralfate 1 g tablet ?Commonly known as: CARAFATE ?TAKE 1 TABLET FOUR TIMES A DAY WITH MEALS AND AT BEDTIME AS NEEDED ONLY ?  ?Symbicort 160-4.5 MCG/ACT inhaler ?Generic drug: budesonide-formoterol ?INHALE 2 PUFFS EVERY MORNING AND EVERY EVENING ?   ?terbinafine 250 MG tablet ?Commonly known as: LAMISIL ?Take 250 mg by mouth daily. ?  ?tiZANidine 2 MG tablet ?Commonly known as: ZANAFLEX ?TAKE 1 TABLET THREE TIMES A DAY AS NEEDED MUSCLE SPASMS ?  ?valACYclovir 1000 MG tablet ?Commonly known as: VALTREX ?TAKE 1 TABLET BY MOUTH DAILY ?  ?Viibryd Starter Pack 10 & 20 MG Kit ?Generic drug: Vilazodone HCl ?Take 1 tablet by mouth daily. ?  ?vitamin B-12 500 MCG tablet ?Commonly known as: CYANOCOBALAMIN ?Take 500 mcg by mouth daily. ?  ? ?  ? ? ?Allergies:  ?Allergies  ?Allergen Reactions  ? [place]per Other (See Comments)  ?  Black pepper Feels like throat is closing, itchy ?Feels like throat is closing, itchy ?Feels like throat is closing, itchy  ? Tape Rash  ?  Paper tape is ok to use.  ? ? ?Family History: ?Family History  ?Problem Relation Age of Onset  ? Heart failure Maternal Grandmother   ? Alzheimer's disease Maternal Grandfather   ? Emphysema Paternal Grandmother   ? Hodgkin's lymphoma Mother   ? Diabetes Maternal Aunt   ? Diabetes Maternal Uncle   ? Cancer Other   ? Breast cancer Neg Hx   ? ? ?Social History:  ? reports that she has been smoking cigarettes. She has a 30.00 pack-year smoking history. She uses smokeless tobacco. She reports current alcohol use. She reports current drug use. Drug: Marijuana. ? ?Physical Exam: ?BP 127/82 (BP Location: Left Arm, Patient Position: Sitting, Cuff Size: Normal)   Pulse 93   Ht _0  (1.473 m)   Wt 154 lb 3.2 oz (69.9 kg)   BMI 32.23 kg/m?   ?Constitutional:  Alert and oriented, no acute distress, nontoxic appearing ?HEENT: [place], AT ?Cardiovascular: No clubbing, cyanosis, or edema ?Respiratory: Normal respiratory effort, no increased work of breathing ?Skin: No rashes, bruises or suspicious lesions ?Neurologic: Grossly intact, no focal deficits, moving all 4 extremities ?Psychiatric: Normal mood and affect ? ?Laboratory Data: ?Results for orders placed or performed in visit on 06/25/21  ?Bladder Scan (Post Void  Residual) in office  ?Result Value Ref Range  ? Scan Result 59m   ? ?Assessment & Plan:   ?1. OAB (overactive bladder) ?PVR WNL today, not clinically infected. Will plan for repeat intravesical Botox with Dr. MMatilde Sprangin 6 months per patient request. ?- Bladder Scan (Post Void Residual) in office ? ?2. Gross hematuria ?Resolved, no further intervention indicated.   ? ?Return in about 6 months (around 12/25/2021) for Intravesical Botox with Dr. MMatilde Sprang ? ?SDebroah Loop PA-C ? ?[facility] [place]?[address] ?[place] [address]?[phone]?   ?

## 2021-06-29 ENCOUNTER — Ambulatory Visit: Payer: Self-pay | Admitting: *Deleted

## 2021-06-29 NOTE — Telephone Encounter (Signed)
?  Chief Complaint: shortness of breath getting worse since last summer.  Inhalers not working like they used to. ?Symptoms: Shortness of breath in the mornings. ?Frequency: Every morning takes about 30 minutes for her to get her breath after using her inhalers ?Pertinent Negatives: Patient denies wheezing or chest tightness now just progressively getting worse. ?Disposition: [] ED /[] Urgent Care (no appt availability in office) / [x] Appointment(In office/virtual)/ []  [place] [facility]/ [] Home Care/ [] Refused Recommended Disposition /[]  Mobile Bus/ []  Follow-up with PCP ?Additional Notes:   ?

## 2021-06-29 NOTE — Telephone Encounter (Signed)
Reason for Disposition ? [1] MODERATE longstanding difficulty breathing (e.g., speaks in phrases, SOB even at rest, pulse 100-120) AND [2] SAME as normal ? ?Answer Assessment - Initial Assessment Questions ?1. RESPIRATORY STATUS: "Describe your breathing?" (e.g., wheezing, shortness of breath, unable to speak, severe coughing)  ?    Pt calling in c/o shortness of breath getting worse.  I can't breath in the mornings.   My inhaler is not helping.    ?2. ONSET: "When did this breathing problem begin?"  ?    Since last summer  It's just getting worse. ?3. PATTERN "Does the difficult breathing come and go, or has it been constant since it started?"  ?    *No Answer* ?4. SEVERITY: "How bad is your breathing?" (e.g., mild, moderate, severe)  ?  - MILD: No SOB at rest, mild SOB with walking, speaks normally in sentences, can lie down, no retractions, pulse < 100.  ?  - MODERATE: SOB at rest, SOB with minimal exertion and prefers to sit, cannot lie down flat, speaks in phrases, mild retractions, audible wheezing, pulse 100-120.  ?  - SEVERE: Very SOB at rest, speaks in single words, struggling to breathe, sitting hunched forward, retractions, pulse > 120  ?    *No Answer* ?5. RECURRENT SYMPTOM: "Have you had difficulty breathing before?" If Yes, ask: "When was the last time?" and "What happened that time?"  ?    *No Answer* ?6. CARDIAC HISTORY: "Do you have any history of heart disease?" (e.g., heart attack, angina, bypass surgery, angioplasty)  ?    *No Answer* ?7. LUNG HISTORY: "Do you have any history of lung disease?"  (e.g., pulmonary embolus, asthma, emphysema) ?    *No Answer* ?8. CAUSE: "What do you think is causing the breathing problem?"  ?    *No Answer* ?9. OTHER SYMPTOMS: "Do you have any other symptoms? (e.g., dizziness, runny nose, cough, chest pain, fever) ?    *No Answer* ?10. O2 SATURATION MONITOR:  "Do you use an oxygen saturation monitor (pulse oximeter) at home?" If Yes, "What is your reading (oxygen  level) today?" "What is your usual oxygen saturation reading?" (e.g., 95%) ?      *No Answer* ?11. PREGNANCY: "Is there any chance you are pregnant?" "When was your last menstrual period?" ?      *No Answer* ?12. TRAVEL: "Have you traveled out of the country in the last month?" (e.g., travel history, exposures) ?      *No Answer* ? ?Protocols used: Breathing Difficulty-A-AH ? ?

## 2021-07-02 ENCOUNTER — Other Ambulatory Visit: Payer: Self-pay | Admitting: Obstetrics & Gynecology

## 2021-07-02 ENCOUNTER — Other Ambulatory Visit: Payer: Self-pay | Admitting: Family Medicine

## 2021-07-02 DIAGNOSIS — I10 Essential (primary) hypertension: Secondary | ICD-10-CM

## 2021-07-02 DIAGNOSIS — B009 Herpesviral infection, unspecified: Secondary | ICD-10-CM

## 2021-07-02 DIAGNOSIS — R232 Flushing: Secondary | ICD-10-CM

## 2021-07-03 NOTE — Telephone Encounter (Signed)
Requested medication (s) are due for refill today:   Yes for both ? ?Requested medication (s) are on the active medication list:   Yes for both ? ?Future visit scheduled:   Y in 2 days ? ? ?Last ordered: Valtrex 04/09/2021 #90, 0 refills;  amlodipine 02/06/2021 #90, 1 refill ? ?Both of these have reached their max refills reason returned.  ? ?Requested Prescriptions  ?Pending Prescriptions Disp Refills  ? valACYclovir (VALTREX) 1000 MG tablet [Pharmacy Med Name: valACYclovir HCl 1GM TABS*] 28 tablet   ?  Sig: TAKE 1 TABLET BY MOUTH DAILY  ?  ? Antimicrobials:  Antiviral Agents - Anti-Herpetic Passed - 07/02/2021 11:12 AM  ?  ?  Passed - Valid encounter within last 12 months  ?  Recent Outpatient Visits   ? ?      ? 4 months ago Essential hypertension  ? [place], DO  ? 7 months ago Primary osteoarthritis involving multiple joints  ? [place], DO  ? 1 year ago Controlled type 2 diabetes mellitus with complication, without long-term current use of insulin ([place])  ? [place], DO  ? 1 year ago Psychophysiological insomnia  ? [place], DO  ? 1 year ago Neuropathy of left upper extremity  ? [place], DO  ? ?  ?  ?Future Appointments   ? ?        ? In 2 days Parks Ranger, Devonne Doughty, DO [facility], [place]  ? In 1 month Ralene Bathe, MD [place]  ? ?  ? ? ?  ?  ?  ? amLODipine (NORVASC) 10 MG tablet [Pharmacy Med Name: amLODIPine Besylate 10MG  TABS*] 28 tablet   ?  Sig: TAKE 1 TABLET DAILY MAY START WITH HALF TABLET (5MG ) IF PREFERRED  ?  ? Cardiovascular: Calcium Channel Blockers 2 Passed - 07/02/2021 11:12 AM  ?  ?  Passed - Last BP in normal range  ?  BP Readings from Last 1 Encounters:  ?06/25/21 127/82  ?  ?  ?  ?  Passed - Last Heart Rate in normal range  ?  Pulse Readings  from Last 1 Encounters:  ?06/25/21 93  ?  ?  ?  ?  Passed - Valid encounter within last 6 months  ?  Recent Outpatient Visits   ? ?      ? 4 months ago Essential hypertension  ? [place], DO  ? 7 months ago Primary osteoarthritis involving multiple joints  ? [place], DO  ? 1 year ago Controlled type 2 diabetes mellitus with complication, without long-term current use of insulin ([place])  ? [place], DO  ? 1 year ago Psychophysiological insomnia  ? [place], DO  ? 1 year ago Neuropathy of left upper extremity  ? [place], DO  ? ?  ?  ?Future Appointments   ? ?        ? In 2 days Parks Ranger, Devonne Doughty, DO [facility], [place]  ? In 1 month Ralene Bathe, MD [place]  ? ?  ? ? ?  ?  ?  ? ?

## 2021-07-05 ENCOUNTER — Ambulatory Visit: Payer: Medicaid Other | Admitting: Family Medicine

## 2021-07-11 ENCOUNTER — Ambulatory Visit: Payer: Self-pay | Admitting: *Deleted

## 2021-07-11 NOTE — Telephone Encounter (Signed)
?  Chief Complaint: requesting medication refills. Noted to be SOB talking  ?Symptoms: shortness of breath can speak whole sentences at times but difficulty. Hoarse voice, N/V due to cough, runny nose, dizziness. Unable to sleep. Needs refill seroquel ?Frequency: 2 days  ?Pertinent Negatives: Patient denies chest pain , no fever ?Disposition: [x] ED /[] Urgent Care (no appt availability in office) / [] Appointment(In office/virtual)/ []  [place] [facility]/ [] Home Care/ [x] Refused Recommended Disposition /[]  Mobile Bus/ []  Follow-up with PCP ?Additional Notes:  ? ?Called for medication refills pharmacy not giving refills. Patient very short of breath and refused to allow call for 911 or go to ED at this time. Please advise if medications can be courtesy refill. Patient crying at times and unable to report which medications need refill except inhalers and seroquel. Please advise . FC Rachael called and notified patient refused 911/ED disposition. ? ? ? ? Reason for Disposition ? [1] MODERATE difficulty breathing (e.g., speaks in phrases, SOB even at rest, pulse 100-120) AND [2] NEW-onset or WORSE than normal ? ?Answer Assessment - Initial Assessment Questions ?1. RESPIRATORY STATUS: "Describe your breathing?" (e.g., wheezing, shortness of breath, unable to speak, severe coughing)  ?    Shortness of breath, difficulty speaking whole sentences ?2. ONSET: "When did this breathing problem begin?"  ?    "Night before last" ?3. PATTERN "Does the difficult breathing come and go, or has it been constant since it started?"  ?    Sounds constant now  ?4. SEVERITY: "How bad is your breathing?" (e.g., mild, moderate, severe)  ?  - MILD: No SOB at rest, mild SOB with walking, speaks normally in sentences, can lie down, no retractions, pulse < 100.  ?  - MODERATE: SOB at rest, SOB with minimal exertion and prefers to sit, cannot lie down flat, speaks in phrases, mild retractions, audible wheezing, pulse 100-120.  ?  -  SEVERE: Very SOB at rest, speaks in single words, struggling to breathe, sitting hunched forward, retractions, pulse > 120  ?    SOB at rest difficulty speaking whole sentences. ?5. RECURRENT SYMPTOM: "Have you had difficulty breathing before?" If Yes, ask: "When was the last time?" and "What happened that time?"  ?    Na  ?6. CARDIAC HISTORY: "Do you have any history of heart disease?" (e.g., heart attack, angina, bypass surgery, angioplasty)  ?    See hx  ?7. LUNG HISTORY: "Do you have any history of lung disease?"  (e.g., pulmonary embolus, asthma, emphysema) ?    See hx  ?8. CAUSE: "What do you think is causing the breathing problem?"  ?    Ran out of medication ?9. OTHER SYMPTOMS: "Do you have any other symptoms? (e.g., dizziness, runny nose, cough, chest pain, fever) ?    Dizziness, runny nose cough N/V hoarse  ?10. O2 SATURATION MONITOR:  "Do you use an oxygen saturation monitor (pulse oximeter) at home?" If Yes, "What is your reading (oxygen level) today?" "What is your usual oxygen saturation reading?" (e.g., 95%) ?      na ?11. PREGNANCY: "Is there any chance you are pregnant?" "When was your last menstrual period?" ?      na ?12. TRAVEL: "Have you traveled out of the country in the last month?" (e.g., travel history, exposures) ?      na ? ?Protocols used: Breathing Difficulty-A-AH ? ?

## 2021-07-11 NOTE — Telephone Encounter (Signed)
Seroquel is managed by her Psychiatry. I believe [facility] is still ordering this. They would need to order it. ? ?I agree with triage on her breathing. Other than urgent care ED if she would like to address it ? ?[name], DO ?[facility] ?07/11/2021, 11:43 AM ? ?

## 2021-07-19 NOTE — Progress Notes (Deleted)
Psychiatric Initial Adult Assessment   Patient Identification: Darlene Barber MRN:  [id number] Date of Evaluation:  07/19/2021 Referral Source: *** Chief Complaint:  No chief complaint on file.  Visit Diagnosis: No diagnosis found.  History of Present Illness:   Darlene Barber is a 56 y.o. year old female with a history of bipolar disorder, hypertension, GERD, chronic pain, who is referred for bipolar disorder.   Daily routine: Diet:  Exercise: Support: Household:  Marital status: Number of children: Employment:  Education:   Last PCP / ongoing medical evaluation:     Associated Signs/Symptoms: Depression Symptoms:  {DEPRESSION SYMPTOMS:20000} (Hypo) Manic Symptoms:  {BHH MANIC SYMPTOMS:22872} Anxiety Symptoms:  {BHH ANXIETY SYMPTOMS:22873} Psychotic Symptoms:  {BHH PSYCHOTIC SYMPTOMS:22874} PTSD Symptoms: {BHH PTSD SYMPTOMS:22875}  Past Psychiatric History:  Outpatient:  Psychiatry admission:  Previous suicide attempt:  Past trials of medication:  History of violence:    Previous Psychotropic Medications: {YES/NO:21197}  Substance Abuse History in the last 12 months:  {yes no:314532}  Consequences of Substance Abuse: {BHH CONSEQUENCES OF SUBSTANCE ABUSE:22880}  Past Medical History:  Past Medical History:  Diagnosis Date   Allergy    Anxiety    Bursitis of both hips    Depression    Frequent headaches    Obesity    Sleep apnea    doesn't use CPAP machine broken,     Past Surgical History:  Procedure Laterality Date   CESAREAN SECTION     x2   CHOLECYSTECTOMY     COLONOSCOPY WITH PROPOFOL N/A 11/20/2017   Procedure: COLONOSCOPY WITH PROPOFOL;  Surgeon: Lin Landsman, MD;  Location: [facility] ENDOSCOPY;  Service: Gastroenterology;  Laterality: N/A;   HYSTERECTOMY ABDOMINAL WITH SALPINGECTOMY  1998   HYSTEROSCOPY      Family Psychiatric History: ***  Family History:  Family History  Problem Relation Age of Onset   Heart failure Maternal  Grandmother    Alzheimer's disease Maternal Grandfather    Emphysema Paternal Grandmother    Hodgkin's lymphoma Mother    Diabetes Maternal Aunt    Diabetes Maternal Uncle    Cancer Other    Breast cancer Neg Hx     Social History:   Social History   Socioeconomic History   Marital status: Divorced    Spouse name: Not on file   Number of children: Not on file   Years of education: High School   Highest education level: Not on file  Occupational History   Occupation: Unemployed  Tobacco Use   Smoking status: Every Day    Packs/day: 1.00    Years: 30.00    Pack years: 30.00    Types: Cigarettes   Smokeless tobacco: Current  Vaping Use   Vaping Use: Never used  Substance and Sexual Activity   Alcohol use: Yes    Comment: occ once per year   Drug use: Yes    Types: Marijuana    Comment: Current use   Sexual activity: Not Currently  Other Topics Concern   Not on file  Social History Narrative   Not on file   Social Determinants of Health   Financial Resource Strain: Not on file  Food Insecurity: Not on file  Transportation Needs: Not on file  Physical Activity: Not on file  Stress: Not on file  Social Connections: Not on file    Additional Social History: ***  Allergies:   Allergies  Allergen Reactions   [place] Other (See Comments)    Black pepper Feels like throat is  closing, itchy Feels like throat is closing, itchy Feels like throat is closing, itchy   Tape Rash    Paper tape is ok to use.    Metabolic Disorder Labs: Lab Results  Component Value Date   HGBA1C 5.2 06/29/2020   MPG 126 11/24/2018   MPG 154 05/22/2018   No results found for: PROLACTIN Lab Results  Component Value Date   CHOL 176 11/24/2018   TRIG 231 (H) 11/24/2018   HDL 38 (L) 11/24/2018   CHOLHDL 4.6 11/24/2018   VLDL 45 (H) 11/12/2016   LDLCALC 103 (H) 11/24/2018   LDLCALC 92 11/19/2017   Lab Results  Component Value Date   TSH 0.93 09/30/2019    Therapeutic Level  Labs: No results found for: LITHIUM No results found for: CBMZ No results found for: VALPROATE  Current Medications: Current Outpatient Medications  Medication Sig Dispense Refill   amLODipine (NORVASC) 10 MG tablet Take 1 tablet (10 mg total) by mouth daily. 90 tablet 3   valACYclovir (VALTREX) 1000 MG tablet TAKE 1 TABLET BY MOUTH DAILY 90 tablet 3   albuterol (VENTOLIN HFA) 108 (90 Base) MCG/ACT inhaler INHALE 2 PUFFS EVERY FOUR HOURS AS NEEDED 18 g 2   BYDUREON BCISE 2 MG/0.85ML AUIJ INJECT 2 MG INTO THE SKIN ONCE A WEEK 3.4 mL 3   clobetasol ointment (TEMOVATE) 0.05 % APPLY TO AFFECTED AREAS TWICE DAILY UNTIL IMPROVED 60 g 0   cloNIDine (CATAPRES) 0.3 MG tablet Take 0.3 mg by mouth 2 (two) times daily.     diclofenac Sodium (VOLTAREN) 1 % GEL Apply 2 g topically 4 (four) times daily as needed (arthritis hand/thumb). 100 g 3   estradiol (ESTRACE) 1 MG tablet Take 1 tablet (1 mg total) by mouth daily. 30 tablet 11   fluticasone (FLONASE) 50 MCG/ACT nasal spray 1 SPARY IN EACH NOSTRIL DAILY 16 g 2   gabapentin (NEURONTIN) 300 MG capsule Take 1 capsule (300 mg total) by mouth 3 (three) times daily. 90 capsule 0   hydrOXYzine (ATARAX/VISTARIL) 10 MG tablet Take 10 mg by mouth 3 (three) times daily.     linaclotide (LINZESS) 145 MCG CAPS capsule TAKE 1 CAPSULE (145 MCG TOTAL) BY MOUTH ONCE DAILY     meloxicam (MOBIC) 15 MG tablet TAKE 1 TABLET BY MOUTH AS NEEDED FOR PAIN 30 tablet 2   nortriptyline (PAMELOR) 25 MG capsule Take by mouth.     omeprazole (PRILOSEC) 40 MG capsule Take 1 capsule (40 mg total) by mouth 2 (two) times daily before a meal. 180 capsule 3   Oxcarbazepine (TRILEPTAL) 300 MG tablet Take 300 mg by mouth 3 (three) times daily.     pregabalin (LYRICA) 150 MG capsule Take 150 mg by mouth 3 (three) times daily.     promethazine (PHENERGAN) 25 MG tablet TAKE 1 TABLET EVERY SIX HOURS AS NEEDED NAUSEA 30 tablet 2   QUEtiapine (SEROQUEL) 300 MG tablet Take 300 mg by mouth 2 (two)  times daily.     SPIRIVA HANDIHALER 18 MCG inhalation capsule INHALE CONTENTS OF ONE CAPSULE ONCE DAILY USING HANDIHALER DEVICE 30 capsule 2   sucralfate (CARAFATE) 1 g tablet TAKE 1 TABLET FOUR TIMES A DAY WITH MEALS AND AT BEDTIME AS NEEDED ONLY 40 tablet 0   SYMBICORT 160-4.5 MCG/ACT inhaler INHALE 2 PUFFS EVERY MORNING AND EVERY EVENING 10.2 g 5   terbinafine (LAMISIL) 250 MG tablet Take 250 mg by mouth daily.     tiZANidine (ZANAFLEX) 2 MG tablet TAKE 1 TABLET  THREE TIMES A DAY AS NEEDED MUSCLE SPASMS 60 tablet 5   VIIBRYD STARTER PACK 10 & 20 MG KIT Take 1 tablet by mouth daily.     vitamin B-12 (CYANOCOBALAMIN) 500 MCG tablet Take 500 mcg by mouth daily.     Current Facility-Administered Medications  Medication Dose Route Frequency Provider Last Rate Last Admin   lidocaine HCl (PF) (XYLOCAINE) 2 % injection 50 mL  50 mL Other Once Bjorn Loser, MD        Musculoskeletal: Strength & Muscle Tone: within normal limits Gait & Station: normal Patient leans: N/A  Psychiatric Specialty Exam: Review of Systems  There were no vitals taken for this visit.There is no height or weight on file to calculate BMI.  General Appearance: {Appearance:22683}  Eye Contact:  {BHH EYE CONTACT:22684}  Speech:  Clear and Coherent  Volume:  Normal  Mood:  {BHH MOOD:22306}  Affect:  {Affect (PAA):22687}  Thought Process:  Coherent  Orientation:  Full (Time, Place, and Person)  Thought Content:  Logical  Suicidal Thoughts:  {ST/HT (PAA):22692}  Homicidal Thoughts:  {ST/HT (PAA):22692}  Memory:  Immediate;   Good  Judgement:  {Judgement (PAA):22694}  Insight:  {Insight (PAA):22695}  Psychomotor Activity:  Normal  Concentration:  Concentration: Good and Attention Span: Good  Recall:  Good  Fund of Knowledge:Good  Language: Good  Akathisia:  No  Handed:  Right  AIMS (if indicated):  not done  Assets:  Communication Skills Desire for Improvement  ADL's:  Intact  Cognition: WNL  Sleep:   {BHH GOOD/FAIR/POOR:22877}   Screenings: GAD-7    Flowsheet Row Office Visit from 05/26/2017 in [facility] Office Visit from 08/20/2016 in [facility]  Total GAD-7 Score 13 21      PHQ2-9    [place] Office Visit from 03/28/2020 in [facility] Office Visit from 12/01/2018 in [facility] Office Visit from 08/21/2018 in [facility] Office Visit from 05/27/2018 in [facility] Office Visit from 03/02/2018 in [place]  PHQ-2 Total Score 2 1 0 0 0  PHQ-9 Total Score _0 -- --       Assessment and Plan:  TULIP MEHARG is a 56 y.o. year old female with a history of , who presents for follow up appointment for below.    Assessment  Plan   The patient demonstrates the following risk factors for suicide: Chronic risk factors for suicide include: {Chronic Risk Factors for NFAOZHY:86578469}. Acute risk factors for suicide include: {Acute Risk Factors for GEXBMWU:13244010}. Protective factors for this patient include: {Protective Factors for Suicide UVOZ:36644034}. Considering these factors, the overall suicide risk at this point appears to be {Desc; low/moderate/high:110033}. Patient {ACTION; IS/IS VQQ:59563875} appropriate for outpatient follow up.       Collaboration of Care: {BH OP Collaboration of Care:21014065}  Patient/Guardian was advised Release of Information must be obtained prior to any record release in order to collaborate their care with an outside provider. Patient/Guardian was advised if they have not already done so to contact the registration department to sign all necessary forms in order for [place] to release information regarding their care.   Consent: Patient/Guardian gives verbal consent for treatment and assignment of benefits for services provided during this visit. Patient/Guardian expressed understanding and agreed to proceed.   Norman Clay,  MD 5/11/20235:27 PM

## 2021-07-23 ENCOUNTER — Ambulatory Visit: Payer: Self-pay | Admitting: Psychiatry

## 2021-07-23 ENCOUNTER — Telehealth: Payer: Self-pay

## 2021-07-23 NOTE — Telephone Encounter (Signed)
Patient called Triage line wanting a new refill for Estradiol, was seeing Dr. Kenton Kingfisher, but hasn't been seen since 06/14/20.  ?

## 2021-07-23 NOTE — Telephone Encounter (Signed)
Called Darlene Barber back and advised patient she needed to come in for an annual.  ? ?Also reminded her Dr. Kenton Kingfisher is no longer here.  ?

## 2021-07-27 ENCOUNTER — Ambulatory Visit: Payer: Medicaid Other | Admitting: Family Medicine

## 2021-07-30 ENCOUNTER — Ambulatory Visit: Payer: Medicaid Other | Admitting: Obstetrics

## 2021-08-01 ENCOUNTER — Other Ambulatory Visit: Payer: Self-pay | Admitting: Family Medicine

## 2021-08-01 DIAGNOSIS — R110 Nausea: Secondary | ICD-10-CM

## 2021-08-02 NOTE — Telephone Encounter (Signed)
Requested medication (s) are due for refill today: yes  Requested medication (s) are on the active medication list: yes    Last refill: 05/08/21  #30  2 refills  Future visit scheduled yes 08/03/21  Notes to clinic:     Not delegated, please review. Thank you.  Requested Prescriptions  Pending Prescriptions Disp Refills   promethazine (PHENERGAN) 25 MG tablet [Pharmacy Med Name: Promethazine HCl 25MG  TABS] 30 tablet 2    Sig: TAKE 1 TABLET EVERY SIX HOURS AS NEEDED FOR NAUSEA     Not Delegated - Gastroenterology: Antiemetics Failed - 08/01/2021 12:57 PM      Failed - This refill cannot be delegated      Passed - Valid encounter within last 6 months    Recent Outpatient Visits           5 months ago Essential hypertension   [place], DO   8 months ago Primary osteoarthritis involving multiple joints   [place], DO   1 year ago Controlled type 2 diabetes mellitus with complication, without long-term current use of insulin [facility])   [place], DO   1 year ago Psychophysiological insomnia   [place], DO   1 year ago Neuropathy of left upper extremity   [place], Devonne Doughty, DO       Future Appointments             Tomorrow Parks Ranger Devonne Doughty, [facility], [place]   In 3 weeks Ralene Bathe, MD [place]

## 2021-08-03 ENCOUNTER — Ambulatory Visit: Payer: Medicaid Other | Admitting: Family Medicine

## 2021-08-07 ENCOUNTER — Ambulatory Visit: Payer: Medicaid Other | Admitting: Obstetrics

## 2021-08-15 ENCOUNTER — Ambulatory Visit: Payer: Medicaid Other | Admitting: Dermatology

## 2021-08-21 ENCOUNTER — Other Ambulatory Visit: Payer: Self-pay | Admitting: Family Medicine

## 2021-08-21 DIAGNOSIS — M159 Polyosteoarthritis, unspecified: Secondary | ICD-10-CM

## 2021-08-22 ENCOUNTER — Ambulatory Visit: Payer: Medicaid Other | Admitting: Family Medicine

## 2021-08-22 NOTE — Telephone Encounter (Signed)
Requested medication (s) are due for refill today: yes  Requested medication (s) are on the active medication list: yes  Last refill:  06/05/21 #30 2 RF  Future visit scheduled: today 08/22/21  Notes to clinic:  overdue lab work - has OV today   Requested Prescriptions  Pending Prescriptions Disp Refills   meloxicam ([place]) 15 MG tablet [Pharmacy Med Name: Meloxicam 15MG TABS] 30 tablet 2    Sig: TAKE 1 TABLET BY MOUTH AS NEEDED FOR PAIN     Analgesics:  COX2 Inhibitors Failed - 08/21/2021  3:38 PM      Failed - Manual Review: Labs are only required if the patient has taken medication for more than 8 weeks.      Failed - HGB in normal range and within 360 days    Hemoglobin  Date Value Ref Range Status  09/30/2019 14.7 11.7 - 15.5 g/dL Final   HGB  Date Value Ref Range Status  04/20/2012 16.7 (H) 12.0 - 16.0 g/dL Final         Failed - Cr in normal range and within 360 days    Creat  Date Value Ref Range Status  09/30/2019 0.83 0.50 - 1.05 mg/dL Final    Comment:    For patients >16 years of age, the reference limit for Creatinine is approximately 13% higher for people identified as African-American. .          Failed - HCT in normal range and within 360 days    HCT  Date Value Ref Range Status  09/30/2019 43.8 35.0 - 45.0 % Final  04/20/2012 50.0 (H) 35.0 - 47.0 % Final         Failed - AST in normal range and within 360 days    AST  Date Value Ref Range Status  09/30/2019 14 10 - 35 U/L Final   SGOT(AST)  Date Value Ref Range Status  04/20/2012 26 15 - 37 Unit/L Final         Failed - ALT in normal range and within 360 days    ALT  Date Value Ref Range Status  09/30/2019 14 6 - 29 U/L Final   SGPT (ALT)  Date Value Ref Range Status  04/20/2012 39 12 - 78 U/L Final         Failed - eGFR is 30 or above and within 360 days    GFR, Est African American  Date Value Ref Range Status  09/30/2019 93 > OR = 60 mL/min/1.21m Final   GFR, Est Non African  American  Date Value Ref Range Status  09/30/2019 81 > OR = 60 mL/min/1.781mFinal         Passed - Patient is not pregnant      Passed - Valid encounter within last 12 months    Recent Outpatient Visits           6 months ago Essential hypertension   [place]   9 months ago Primary osteoarthritis involving multiple joints   [place]   1 year ago Controlled type 2 diabetes mellitus with complication, [facility] long-term current use of insulin [facility]   1 year ago Psychophysiological insomnia   [place]   1 year ago Neuropathy of left upper extremity   [place]  Future Appointments             Today Parks Ranger, Devonne Doughty, DO [facility], [place]   In 5 days Ralene Bathe, MD [place]

## 2021-08-27 ENCOUNTER — Ambulatory Visit: Payer: Self-pay | Admitting: Dermatology

## 2021-08-28 ENCOUNTER — Ambulatory Visit: Payer: Medicaid Other | Admitting: Family Medicine

## 2021-09-03 ENCOUNTER — Ambulatory Visit: Payer: Medicaid Other | Admitting: Family Medicine

## 2021-09-03 ENCOUNTER — Encounter: Payer: Self-pay | Admitting: Family Medicine

## 2021-09-03 VITALS — BP 134/96 | HR 96 | Ht <= 58 in | Wt 146.2 lb

## 2021-09-03 DIAGNOSIS — K219 Gastro-esophageal reflux disease without esophagitis: Secondary | ICD-10-CM

## 2021-09-03 DIAGNOSIS — R110 Nausea: Secondary | ICD-10-CM | POA: Diagnosis not present

## 2021-09-03 DIAGNOSIS — G894 Chronic pain syndrome: Secondary | ICD-10-CM

## 2021-09-03 DIAGNOSIS — M4692 Unspecified inflammatory spondylopathy, cervical region: Secondary | ICD-10-CM

## 2021-09-03 DIAGNOSIS — F319 Bipolar disorder, unspecified: Secondary | ICD-10-CM | POA: Diagnosis not present

## 2021-09-03 DIAGNOSIS — F5104 Psychophysiologic insomnia: Secondary | ICD-10-CM

## 2021-09-03 DIAGNOSIS — M5412 Radiculopathy, cervical region: Secondary | ICD-10-CM

## 2021-09-03 MED ORDER — PROMETHAZINE HCL 25 MG PO TABS: 25.0000 mg | ORAL_TABLET | Freq: Four times a day (QID) | ORAL | 0 refills | Status: DC | PRN

## 2021-09-03 MED ORDER — PANTOPRAZOLE SODIUM 40 MG PO TBEC: 40.0000 mg | DELAYED_RELEASE_TABLET | Freq: Two times a day (BID) | ORAL | 0 refills | Status: DC

## 2021-09-06 ENCOUNTER — Other Ambulatory Visit: Payer: Self-pay | Admitting: Family Medicine

## 2021-09-06 DIAGNOSIS — E118 Type 2 diabetes mellitus with unspecified complications: Secondary | ICD-10-CM

## 2021-09-06 NOTE — Telephone Encounter (Signed)
Requested medication (s) are due for refill today: yes  Requested medication (s) are on the active medication list: yes  Last refill:  04/05/21 #3.34m/3  Future visit scheduled: no  Notes to clinic:  Unable to refill per protocol due to failed labs, no updated results.    Requested Prescriptions  Pending Prescriptions Disp Refills   BCambria2 MG/0.85ML AUIJ [Pharmacy Med Name: Bydureon BCise 2MG/0.85ML AUIJ] 3.4 mL 3    Sig: INJECT 2 MG INTO SKIN EVERY WEEK     Endocrinology:  Diabetes - GLP-1 Receptor Agonists - exenatide Failed - 09/06/2021  3:28 PM      Failed - HBA1C is between 0 and 7.9 and within 180 days    Hemoglobin A1C  Date Value Ref Range Status  06/29/2020 5.2 4.0 - 5.6 % Final   Hgb A1c MFr Bld  Date Value Ref Range Status  11/24/2018 6.0 (H) <5.7 % of total Hgb Final    Comment:    For someone without known diabetes, a hemoglobin  A1c value between 5.7% and 6.4% is consistent with prediabetes and should be confirmed with a  follow-up test. . For someone with known diabetes, a value <7% indicates that their diabetes is well controlled. A1c targets should be individualized based on duration of diabetes, age, comorbid conditions, and other considerations. . This assay result is consistent with an increased risk of diabetes. . Currently, no consensus exists regarding use of hemoglobin A1c for diagnosis of diabetes for children. .          Failed - Cr in normal range and within 360 days    Creat  Date Value Ref Range Status  09/30/2019 0.83 0.50 - 1.05 mg/dL Final    Comment:    For patients >462years of age, the reference limit for Creatinine is approximately 13% higher for people identified as African-American. .          Failed - eGFR in normal range and within 360 days    GFR, Est African American  Date Value Ref Range Status  09/30/2019 93 > OR = 60 mL/min/1.721mFinal   GFR, Est Non African American  Date Value Ref Range Status   09/30/2019 81 > OR = 60 mL/min/1.7370minal         Passed - Valid encounter within last 6 months    Recent Outpatient Visits           3 days ago Gastroesophageal reflux disease without esophagitis   [place]   7 months ago Essential hypertension   [place]   10 months ago Primary osteoarthritis involving multiple joints   [place]   1 year ago Controlled type 2 diabetes mellitus with complication, without long-term current use of insulin ([facility]   1 year ago Psychophysiological insomnia   [place]

## 2021-09-26 ENCOUNTER — Other Ambulatory Visit: Payer: Self-pay | Admitting: Family Medicine

## 2021-09-26 DIAGNOSIS — R110 Nausea: Secondary | ICD-10-CM

## 2021-09-26 DIAGNOSIS — M159 Polyosteoarthritis, unspecified: Secondary | ICD-10-CM

## 2021-09-27 NOTE — Telephone Encounter (Signed)
Requested medication (s) are due for refill today: 10/03/21  Requested medication (s) are on the active medication list: yes  Last refill:  phenergan- 09/03/21 #90 0 refills, mobic- 08/23/21 #30 0 refills  Future visit scheduled: no  Notes to clinic:  not delegated per protocol, last labs 09/30/19. Do you want to refill Rxs?     Requested Prescriptions  Pending Prescriptions Disp Refills   promethazine (PHENERGAN) 25 MG tablet [Pharmacy Med Name: Promethazine HCl 25MG TABS] 90 tablet 0    Sig: TAKE 1 TABLET BY MOUTH EVERY SIX HOURS AS NEEDED FOR NAUSEA     Not Delegated - Gastroenterology: Antiemetics Failed - 09/26/2021  3:05 PM      Failed - This refill cannot be delegated      Passed - Valid encounter within last 6 months    Recent Outpatient Visits           3 weeks ago Gastroesophageal reflux disease without esophagitis   [place], DO   7 months ago Essential hypertension   [place], DO   10 months ago Primary osteoarthritis involving multiple joints   [place], DO   1 year ago Controlled type 2 diabetes mellitus with complication, without long-term current use of insulin [facility])   [facility] Parks Ranger, Devonne Doughty, DO   1 year ago Psychophysiological insomnia   [facility] [place], Devonne Doughty, DO               meloxicam (MOBIC) 15 MG tablet [Pharmacy Med Name: Meloxicam 15MG TABS] 30 tablet 0    Sig: TAKE 1 TABLET BY MOUTH DAILY AS NEEDED FOR PAIN     Analgesics:  COX2 Inhibitors Failed - 09/26/2021  3:05 PM      Failed - Manual Review: Labs are only required if the patient has taken medication for more than 8 weeks.      Failed - HGB in normal range and within 360 days    Hemoglobin  Date Value Ref Range Status  09/30/2019 14.7 11.7 - 15.5 g/dL Final   HGB  Date Value Ref Range Status   04/20/2012 16.7 (H) 12.0 - 16.0 g/dL Final         Failed - Cr in normal range and within 360 days    Creat  Date Value Ref Range Status  09/30/2019 0.83 0.50 - 1.05 mg/dL Final    Comment:    For patients >89 years of age, the reference limit for Creatinine is approximately 13% higher for people identified as African-American. .          Failed - HCT in normal range and within 360 days    HCT  Date Value Ref Range Status  09/30/2019 43.8 35.0 - 45.0 % Final  04/20/2012 50.0 (H) 35.0 - 47.0 % Final         Failed - AST in normal range and within 360 days    AST  Date Value Ref Range Status  09/30/2019 14 10 - 35 U/L Final   SGOT(AST)  Date Value Ref Range Status  04/20/2012 26 15 - 37 Unit/L Final         Failed - ALT in normal range and within 360 days    ALT  Date Value Ref Range Status  09/30/2019 14 6 - 29 U/L Final   SGPT (ALT)  Date Value Ref Range Status  04/20/2012 39 12 - 78 U/L Final         Failed - eGFR is 30 or above and within 360 days    GFR, Est African American  Date Value Ref Range Status  09/30/2019 93 > OR = 60 mL/min/1.6m Final   GFR, Est Non African American  Date Value Ref Range Status  09/30/2019 81 > OR = 60 mL/min/1.790mFinal         Passed - Patient is not pregnant      Passed - Valid encounter within last 12 months    Recent Outpatient Visits           3 weeks ago Gastroesophageal reflux disease without esophagitis   [place]   7 months ago Essential hypertension   [place]   10 months ago Primary osteoarthritis involving multiple joints   [place]   1 year ago Controlled type 2 diabetes mellitus with complication, without long-term current use of insulin [facility]  [facility]   1 year ago Psychophysiological insomnia   [place]

## 2021-10-04 ENCOUNTER — Ambulatory Visit: Payer: Medicaid Other | Admitting: Physician Assistant

## 2021-10-04 ENCOUNTER — Other Ambulatory Visit: Payer: Self-pay | Admitting: Physician Assistant

## 2021-10-04 DIAGNOSIS — M5412 Radiculopathy, cervical region: Secondary | ICD-10-CM

## 2021-10-04 DIAGNOSIS — M542 Cervicalgia: Secondary | ICD-10-CM

## 2021-10-04 DIAGNOSIS — R200 Anesthesia of skin: Secondary | ICD-10-CM

## 2021-10-08 ENCOUNTER — Telehealth: Payer: Self-pay

## 2021-10-08 ENCOUNTER — Other Ambulatory Visit: Payer: Self-pay

## 2021-10-08 ENCOUNTER — Ambulatory Visit: Admission: RE | Admit: 2021-10-08 | Payer: Medicaid Other | Source: Ambulatory Visit

## 2021-10-08 DIAGNOSIS — J410 Simple chronic bronchitis: Secondary | ICD-10-CM

## 2021-10-08 DIAGNOSIS — J0110 Acute frontal sinusitis, unspecified: Secondary | ICD-10-CM

## 2021-10-08 DIAGNOSIS — J3089 Other allergic rhinitis: Secondary | ICD-10-CM

## 2021-10-08 MED ORDER — FLUTICASONE PROPIONATE 50 MCG/ACT NA SUSP: NASAL | 0 refills | Status: DC

## 2021-10-08 MED ORDER — ALBUTEROL SULFATE HFA 108 (90 BASE) MCG/ACT IN AERS: INHALATION_SPRAY | RESPIRATORY_TRACT | 0 refills | Status: DC

## 2021-10-08 MED ORDER — SPIRIVA HANDIHALER 18 MCG IN CAPS: ORAL_CAPSULE | RESPIRATORY_TRACT | 0 refills | Status: DC

## 2021-10-08 NOTE — Telephone Encounter (Signed)
Copied from [place] [id number]. Topic: General - Inquiry >> Oct 08, 2021  9:34 AM Rosanne Ashing P wrote: Reason for CRM: Pt called saying she has an appt with a new provider but in the mean time she is needing refills on some of her medications.  CB@  [phone]

## 2021-10-08 NOTE — Telephone Encounter (Signed)
Spoke to pt sent in 2 inhalers and 1 nasal spray for patient. She verbalized understanding.  KP

## 2021-10-08 NOTE — Telephone Encounter (Signed)
Please review.  Pt was discharged 08/30/2021 she has an appointment with the new provider 08/02

## 2021-10-08 NOTE — Telephone Encounter (Signed)
That apt is in 2 days.  Can you find out which medicines she needs before 8/2?  I would suggest calling the pharmacy to see if they can give her 2-3 day fill to make it to that apt as a bridge. Instead of a bunch of brand new 30 day supply orders.  Let me know, if I need to order a very short term supply.  [name], [facility] 10/08/2021, 10:23 AM

## 2021-10-10 DIAGNOSIS — J449 Chronic obstructive pulmonary disease, unspecified: Secondary | ICD-10-CM | POA: Insufficient documentation

## 2021-10-18 ENCOUNTER — Other Ambulatory Visit: Payer: Self-pay | Admitting: Family Medicine

## 2021-10-18 ENCOUNTER — Ambulatory Visit
Admission: RE | Admit: 2021-10-18 | Discharge: 2021-10-18 | Disposition: A | Discharge status: Home / Self Care | Payer: Medicaid Other | Source: Ambulatory Visit | Attending: Physician Assistant | Admitting: Physician Assistant

## 2021-10-18 DIAGNOSIS — M542 Cervicalgia: Secondary | ICD-10-CM | POA: Insufficient documentation

## 2021-10-18 DIAGNOSIS — R202 Paresthesia of skin: Secondary | ICD-10-CM | POA: Diagnosis present

## 2021-10-18 DIAGNOSIS — J432 Centrilobular emphysema: Secondary | ICD-10-CM

## 2021-10-18 DIAGNOSIS — R200 Anesthesia of skin: Secondary | ICD-10-CM | POA: Diagnosis present

## 2021-10-18 DIAGNOSIS — J410 Simple chronic bronchitis: Secondary | ICD-10-CM

## 2021-10-18 DIAGNOSIS — M5412 Radiculopathy, cervical region: Secondary | ICD-10-CM | POA: Diagnosis present

## 2021-10-18 NOTE — Telephone Encounter (Signed)
Spiriva and Ventolin-10/08/21-too soon Requested Prescriptions  Pending Prescriptions Disp Refills  . SYMBICORT 160-4.5 MCG/ACT inhaler [Pharmacy Med Name: Symbicort 160-4.5MCG/ACT AERO] 10.2 g 5    Sig: INHALE 2 PUFFS EVERY MORNING AND EVERY EVENING     Pulmonology:  Combination Products Passed - 10/18/2021  8:40 AM      Passed - Valid encounter within last 12 months    Recent Outpatient Visits          1 month ago Gastroesophageal reflux disease without esophagitis   [place], DO   8 months ago Essential hypertension   [place], DO   11 months ago Primary osteoarthritis involving multiple joints   [place], DO   1 year ago Controlled type 2 diabetes mellitus with complication, without long-term current use of insulin [facility])   [facility] Olin Hauser, DO   1 year ago Psychophysiological insomnia   [place], DO             Refused Prescriptions Disp Refills  . SPIRIVA HANDIHALER 18 MCG inhalation capsule [Pharmacy Med Name: Spiriva HandiHaler 18MCG CAPS] 30 capsule 0    Sig: INHALE CONTENTS OF ONE CAPSULE ONCE DAILY USING HANDIHALER DEVICE     Pulmonology:  Anticholinergic Agents Passed - 10/18/2021  8:40 AM      Passed - Valid encounter within last 12 months    Recent Outpatient Visits          1 month ago Gastroesophageal reflux disease without esophagitis   [place], DO   8 months ago Essential hypertension   [place], DO   11 months ago Primary osteoarthritis involving multiple joints   [place], DO   1 year ago Controlled type 2 diabetes mellitus with complication, without long-term current use of insulin ([place])   [facility]  Olin Hauser, DO   1 year ago Psychophysiological insomnia   [place], Devonne Doughty, DO             . albuterol (VENTOLIN HFA) 108 (90 Base) MCG/ACT inhaler [Pharmacy Med Name: Ventolin HFA 108 (90 Base)MCG/ACT AERS] 18 g 0    Sig: INHALE 2 PUFFS EVERY FOUR HOURS AS NEEDED     Pulmonology:  Beta Agonists 2 Failed - 10/18/2021  8:40 AM      Failed - Last BP in normal range    BP Readings from Last 1 Encounters:  09/03/21 (!) 134/96         Passed - Last Heart Rate in normal range    Pulse Readings from Last 1 Encounters:  09/03/21 96         Passed - Valid encounter within last 12 months    Recent Outpatient Visits          1 month ago Gastroesophageal reflux disease without esophagitis   [place], Devonne Doughty, DO   8 months ago Essential hypertension   [place], DO   11 months ago Primary osteoarthritis involving multiple joints   [place], DO   1 year ago Controlled type 2 diabetes mellitus with complication, without long-term current use of insulin [facility])   [place], Devonne Doughty,  DO   1 year ago Psychophysiological insomnia   [employer], [place]

## 2021-10-22 ENCOUNTER — Other Ambulatory Visit: Payer: Self-pay | Admitting: Family Medicine

## 2021-10-22 DIAGNOSIS — M159 Polyosteoarthritis, unspecified: Secondary | ICD-10-CM

## 2021-10-22 NOTE — Telephone Encounter (Signed)
Requested medication (s) are due for refill today: yes  Requested medication (s) are on the active medication list: yes  Last refill:  06/05/21 #60/5  Future visit scheduled: no  Notes to clinic:  Unable to refill per protocol, cannot delegate.      Requested Prescriptions  Pending Prescriptions Disp Refills   tiZANidine (ZANAFLEX) 2 MG tablet [Pharmacy Med Name: tiZANidine HCl 2MG  TABS*] 60 tablet 5    Sig: TAKE 1 TABLET THREE TIMES A DAY AS NEEDED MUSCLE SPASMS     Not Delegated - Cardiovascular:  Alpha-2 Agonists - tizanidine Failed - 10/22/2021 11:46 AM      Failed - This refill cannot be delegated      Passed - Valid encounter within last 6 months    Recent Outpatient Visits           1 month ago Gastroesophageal reflux disease without esophagitis   [place], DO   8 months ago Essential hypertension   [place], DO   11 months ago Primary osteoarthritis involving multiple joints   [place], DO   1 year ago Controlled type 2 diabetes mellitus with complication, without long-term current use of insulin [facility])   [facility] Olin Hauser, DO   1 year ago Psychophysiological insomnia   [place], Devonne Doughty, [place]

## 2021-10-25 ENCOUNTER — Other Ambulatory Visit: Payer: Self-pay | Admitting: Family Medicine

## 2021-10-25 DIAGNOSIS — E118 Type 2 diabetes mellitus with unspecified complications: Secondary | ICD-10-CM

## 2021-10-26 NOTE — Telephone Encounter (Signed)
Requested medication (s) are due for refill today: yes  Requested medication (s) are on the active medication list: yes  Last refill:  09/07/21 #3.31m/0  Future visit scheduled: no  Notes to clinic:  Unable to refill per protocol due to failed labs, no updated results. Pt also has different PCP listed.      Requested Prescriptions  Pending Prescriptions Disp Refills   BRandolph2 MG/0.85ML AUIJ [Pharmacy Med Name: Bydureon BCise 2MG/0.85ML AUIJ] 3.4 mL 0    Sig: INJECT 2 MG INTO SKIN EVERY WEEK     Endocrinology:  Diabetes - GLP-1 Receptor Agonists - exenatide Failed - 10/25/2021  2:53 PM      Failed - HBA1C is between 0 and 7.9 and within 180 days    Hemoglobin A1C  Date Value Ref Range Status  06/29/2020 5.2 4.0 - 5.6 % Final   Hgb A1c MFr Bld  Date Value Ref Range Status  11/24/2018 6.0 (H) <5.7 % of total Hgb Final    Comment:    For someone without known diabetes, a hemoglobin  A1c value between 5.7% and 6.4% is consistent with prediabetes and should be confirmed with a  follow-up test. . For someone with known diabetes, a value <7% indicates that their diabetes is well controlled. A1c targets should be individualized based on duration of diabetes, age, comorbid conditions, and other considerations. . This assay result is consistent with an increased risk of diabetes. . Currently, no consensus exists regarding use of hemoglobin A1c for diagnosis of diabetes for children. .          Failed - Cr in normal range and within 360 days    Creat  Date Value Ref Range Status  09/30/2019 0.83 0.50 - 1.05 mg/dL Final    Comment:    For patients >459years of age, the reference limit for Creatinine is approximately 13% higher for people identified as African-American. .          Failed - eGFR in normal range and within 360 days    GFR, Est African American  Date Value Ref Range Status  09/30/2019 93 > OR = 60 mL/min/1.765mFinal   GFR, Est Non African  American  Date Value Ref Range Status  09/30/2019 81 > OR = 60 mL/min/1.7337minal         Passed - Valid encounter within last 6 months    Recent Outpatient Visits           1 month ago Gastroesophageal reflux disease without esophagitis   [place]   8 months ago Essential hypertension   [place]   11 months ago Primary osteoarthritis involving multiple joints   [place]   1 year ago Controlled type 2 diabetes mellitus with complication, without long-term current use of insulin ([facility]   1 year ago Psychophysiological insomnia   [place]

## 2021-11-06 ENCOUNTER — Ambulatory Visit (INDEPENDENT_AMBULATORY_CARE_PROVIDER_SITE_OTHER): Payer: Medicaid Other | Admitting: Obstetrics & Gynecology

## 2021-11-06 ENCOUNTER — Encounter: Payer: Self-pay | Admitting: Obstetrics & Gynecology

## 2021-11-06 VITALS — BP 110/74 | HR 89 | Resp 16 | Ht <= 58 in | Wt 163.2 lb

## 2021-11-06 DIAGNOSIS — Z1231 Encounter for screening mammogram for malignant neoplasm of breast: Secondary | ICD-10-CM | POA: Diagnosis not present

## 2021-11-19 ENCOUNTER — Other Ambulatory Visit: Payer: Self-pay | Admitting: Family Medicine

## 2021-11-19 DIAGNOSIS — J410 Simple chronic bronchitis: Secondary | ICD-10-CM

## 2021-11-21 NOTE — Telephone Encounter (Signed)
Unable to refill per protocol, patient is no longer under Dr. Christella Scheuermann care. See Telephone encounter on 10/08/21. Will refuse medication.  Requested Prescriptions  Pending Prescriptions Disp Refills  . albuterol (VENTOLIN HFA) 108 (90 Base) MCG/ACT inhaler [Pharmacy Med Name: Ventolin HFA 108 (90 Base)MCG/ACT AERS] 18 g 0    Sig: INHALE 2 PUFFS EVERY FOUR HOURS AS NEEDED     Pulmonology:  Beta Agonists 2 Passed - 11/19/2021  4:52 PM      Passed - Last BP in normal range    BP Readings from Last 1 Encounters:  11/06/21 110/74         Passed - Last Heart Rate in normal range    Pulse Readings from Last 1 Encounters:  11/06/21 89         Passed - Valid encounter within last 12 months    Recent Outpatient Visits          2 months ago Gastroesophageal reflux disease without esophagitis   [place], DO   9 months ago Essential hypertension   [place], DO   1 year ago Primary osteoarthritis involving multiple joints   [place], DO   1 year ago Controlled type 2 diabetes mellitus with complication, without long-term current use of insulin [facility])   [facility] [place], Devonne Doughty, DO   1 year ago Psychophysiological insomnia   [place], Devonne Doughty, DO

## 2021-11-26 ENCOUNTER — Other Ambulatory Visit: Payer: Self-pay | Admitting: Family Medicine

## 2021-11-26 DIAGNOSIS — R110 Nausea: Secondary | ICD-10-CM

## 2021-11-27 ENCOUNTER — Other Ambulatory Visit: Payer: Self-pay | Admitting: Family Medicine

## 2021-11-27 DIAGNOSIS — J3089 Other allergic rhinitis: Secondary | ICD-10-CM

## 2021-11-27 DIAGNOSIS — J0110 Acute frontal sinusitis, unspecified: Secondary | ICD-10-CM

## 2021-11-27 NOTE — Telephone Encounter (Signed)
Requested medication (s) are due for refill today:   Provider to review  Requested medication (s) are on the active medication list:   Yes  Future visit scheduled:   No    Looks like this pt is no longer under the care of Dr. [place] Ranger.   Last ordered: This is returned because it's a controlled, non delegated request.     Requested Prescriptions  Pending Prescriptions Disp Refills   promethazine (PHENERGAN) 25 MG tablet [Pharmacy Med Name: Promethazine HCl 25MG  TABS] 90 tablet 0    Sig: TAKE 1 TABLET BY MOUTH EVERY SIX HOURS AS NEEDED FOR NAUSEA     Not Delegated - Gastroenterology: Antiemetics Failed - 11/26/2021  4:03 PM      Failed - This refill cannot be delegated      Passed - Valid encounter within last 6 months    Recent Outpatient Visits           2 months ago Gastroesophageal reflux disease without esophagitis   [place], DO   9 months ago Essential hypertension   [place], DO   1 year ago Primary osteoarthritis involving multiple joints   [place], DO   1 year ago Controlled type 2 diabetes mellitus with complication, without long-term current use of insulin [facility])   [facility] [place], Devonne Doughty, DO   1 year ago Psychophysiological insomnia   [place], Devonne Doughty, DO

## 2021-11-28 NOTE — Telephone Encounter (Signed)
pt now a pt of Dr. Harrel Lemon as of 10/18/21  Requested Prescriptions  Refused Prescriptions Disp Refills  . fluticasone (FLONASE) 50 MCG/ACT nasal spray [Pharmacy Med Name: Fluticasone Propionate 50MCG/ACT SUSP] 16 g 0    Sig: USE 1 SPRAY IN EACH NOSTRIL DAILY     Ear, Nose, and Throat: Nasal Preparations - Corticosteroids Passed - 11/27/2021 12:55 PM      Passed - Valid encounter within last 12 months    Recent Outpatient Visits          2 months ago Gastroesophageal reflux disease without esophagitis   [place], DO   9 months ago Essential hypertension   [place], DO   1 year ago Primary osteoarthritis involving multiple joints   [place], DO   1 year ago Controlled type 2 diabetes mellitus with complication, without long-term current use of insulin [facility])   [facility] Olin Hauser, DO   1 year ago Psychophysiological insomnia   [place], Devonne Doughty, [place]

## 2021-12-03 ENCOUNTER — Other Ambulatory Visit: Payer: Self-pay | Admitting: Family Medicine

## 2021-12-03 DIAGNOSIS — J410 Simple chronic bronchitis: Secondary | ICD-10-CM

## 2021-12-03 NOTE — Telephone Encounter (Signed)
Requested medications are due for refill today.  yes  Requested medications are on the active medications list.  yes  Last refill. 10/08/2021  30 0 refills  Future visit scheduled.   no  Notes to clinic.  PCP listed is Harrel Lemon.    Requested Prescriptions  Pending Prescriptions Disp Refills   SPIRIVA HANDIHALER 18 MCG inhalation capsule [Pharmacy Med Name: Spiriva HandiHaler 18MCG CAPS] 30 capsule 0    Sig: INHALE CONTENTS OF ONE CAPSULE ONCE DAILY USING HANDIHALER DEVICE     Pulmonology:  Anticholinergic Agents Passed - 12/03/2021  8:36 AM      Passed - Valid encounter within last 12 months    Recent Outpatient Visits           3 months ago Gastroesophageal reflux disease without esophagitis   [place], DO   10 months ago Essential hypertension   [place], DO   1 year ago Primary osteoarthritis involving multiple joints   [place], DO   1 year ago Controlled type 2 diabetes mellitus with complication, without long-term current use of insulin [facility])   [facility] Olin Hauser, DO   1 year ago Psychophysiological insomnia   [place], Devonne Doughty, [place]

## 2021-12-13 ENCOUNTER — Other Ambulatory Visit: Payer: Self-pay

## 2021-12-13 DIAGNOSIS — N3281 Overactive bladder: Secondary | ICD-10-CM

## 2021-12-13 NOTE — Progress Notes (Signed)
    GYNECOLOGY PROGRESS NOTE  Subjective:    Patient ID: CLEDA IMEL, female    DOB: Feb 10, 1966, 56 y.o.   MRN: [id number]  HPI  Patient is a 56 y.o. [phone] female who presents for   The following portions of the patient's history were reviewed and updated as appropriate: allergies, current medications, past family history, past medical history, past social history, past surgical history, and problem list.  Review of Systems A comprehensive review of systems was negative.   Objective:   Blood pressure 110/74, pulse 89, resp. rate 16, height 4\' 10"  (1.473 m), weight 163 lb 3.2 oz (74 kg), SpO2 96 %. Body mass index is 34.11 kg/m. General appearance: alert, cooperative, and no distress Abdomen: soft, non-tender; bowel sounds normal; no masses,  no organomegaly Pelvic: cervix normal in appearance, external genitalia normal, no adnexal masses or tenderness, no cervical motion tenderness, uterus normal size, shape, and consistency, and vagina normal without discharge Extremities: extremities normal, atraumatic, no cyanosis or edema Neurologic: Grossly normal   Assessment:   1. Breast cancer screening by mammogram     Rosario Adie, MD  12/13/2021 2:24 AM   Plan:   1. Breast cancer screening by mammogram  - MM 3D SCREEN BREAST BILATERAL; Future

## 2021-12-18 ENCOUNTER — Other Ambulatory Visit: Payer: Self-pay | Admitting: Obstetrics and Gynecology

## 2021-12-18 ENCOUNTER — Telehealth: Payer: Self-pay

## 2021-12-18 DIAGNOSIS — R232 Flushing: Secondary | ICD-10-CM

## 2021-12-18 MED ORDER — GABAPENTIN 300 MG PO CAPS: 300.0000 mg | ORAL_CAPSULE | Freq: Three times a day (TID) | ORAL | 3 refills | Status: DC

## 2021-12-18 MED ORDER — ESTRADIOL 1 MG PO TABS: 1.0000 mg | ORAL_TABLET | Freq: Every day | ORAL | 3 refills | Status: DC

## 2021-12-18 NOTE — Telephone Encounter (Signed)
Pt seen Langley Gauss and Dr Kenton Kingfisher in the past she wants a refill on her Estradiol and Gabapentin. States her hot flashes are really bad. Can you refill? She just recently had an annual in September with Langley Gauss.

## 2021-12-18 NOTE — Telephone Encounter (Signed)
Rx RF eRxd. Have pt check with pharm re: gabapentin and lyrica together?

## 2021-12-18 NOTE — Progress Notes (Signed)
Rx RF gabapentin and estradiol for VS sx. Annaul 9/23 with Langley Gauss

## 2021-12-19 NOTE — Telephone Encounter (Signed)
Pt aware.

## 2021-12-25 ENCOUNTER — Other Ambulatory Visit: Payer: Medicaid Other

## 2021-12-25 DIAGNOSIS — N3281 Overactive bladder: Secondary | ICD-10-CM

## 2021-12-25 LAB — URINALYSIS, COMPLETE
Bilirubin, UA: NEGATIVE
Glucose, UA: NEGATIVE
Ketones, UA: NEGATIVE
Leukocytes,UA: NEGATIVE
Nitrite, UA: NEGATIVE
Protein,UA: NEGATIVE
RBC, UA: NEGATIVE
Specific Gravity, UA: 1.02 (ref 1.005–1.030)
Urobilinogen, Ur: 0.2 mg/dL (ref 0.2–1.0)
pH, UA: 5.5 (ref 5.0–7.5)

## 2021-12-25 LAB — MICROSCOPIC EXAMINATION

## 2021-12-28 LAB — CULTURE, URINE COMPREHENSIVE

## 2022-01-03 ENCOUNTER — Other Ambulatory Visit: Payer: Self-pay | Admitting: Family Medicine

## 2022-01-03 DIAGNOSIS — M5412 Radiculopathy, cervical region: Secondary | ICD-10-CM

## 2022-01-07 ENCOUNTER — Encounter: Payer: Self-pay | Admitting: Urology

## 2022-01-07 ENCOUNTER — Other Ambulatory Visit: Payer: Self-pay | Admitting: *Deleted

## 2022-01-07 ENCOUNTER — Ambulatory Visit (INDEPENDENT_AMBULATORY_CARE_PROVIDER_SITE_OTHER): Payer: Medicaid Other | Admitting: Urology

## 2022-01-07 DIAGNOSIS — N3946 Mixed incontinence: Secondary | ICD-10-CM | POA: Diagnosis not present

## 2022-01-07 DIAGNOSIS — N3281 Overactive bladder: Secondary | ICD-10-CM

## 2022-01-07 MED ORDER — ONABOTULINUMTOXINA 100 UNITS IJ SOLR
100.0000 [IU] | Freq: Once | INTRAMUSCULAR | Status: AC
  Administered 2022-01-07: 100 [IU] via INTRAMUSCULAR

## 2022-01-07 NOTE — Progress Notes (Signed)
01/07/2022 9:13 AM   Darlene Barber January 09, 1966 [id number]  Referring provider: Olin Hauser, DO [address] [place],   [address]  Chief Complaint  Patient presents with   Botulinum Toxin Injection    HPI: She has urge incontinence that is refractory to treatment with very little warning.  They can be high-volume.  She has some stress incontinence with bronchitis.  She does very well with Botox.  Frequency stable.  Incontinence stable.  Clinically not infected     Today Last Botox was May 15, 2020.  She was doing well postoperatively and was emptying well Patient started to leak a little bit even with coughing sneezing and the Botox usually helps this.  Clinically not infected.  Frequency stable     Today Last Botox treatment was June 11, 2021.  She had blood in the urine after the Botox that resolved.  Residual 2 weeks later was 99 mL.  Recent urine culture negative  [place] stable.  Incontinence stable.  I reviewed the chart and the patient has primarily refractory overactive bladder and mild bedwetting and mild stress incontinence.  When do we did urodynamics in 2019 she did not leak with a Valsalva pressure of 122 cm water.  She had bladder overactivity.  She has failed Myrbetriq and a number of antimuscarinics.  She leaks a small amount with coughing but more so she has a bad cough with her bronchitis and was trying to stop smoking.  In 2019 we talked about InterStim versus Botox.  When she gets urge incontinence it is very sudden.  When I saw her in April 2021 she was not wearing pads with no bedwetting and rare urge incontinence.  Still would leak with bronchitis.  When I saw her in January 2022 she was not wearing a pad but could have some high-volume episodes  Currently wears 2 pads a day primarily with urgency.  She can leak when she goes from a sitting to standing position.  With a light cough she does not leak.  If she still keeps coughing she will leak.  On  pelvic examination she had a high small cystocele.  She had grade 1 hypermobility the bladder neck with a little bit of descensus at rest.  She had no stress incontinence after cystoscopy and Botox with a modest cough.  Cystoscopy and Botox: Patient underwent flexible cystoscopy and Botox.  Bladder mucosa and trigone were normal.  I injected with my modified template primarily in the midline but also the floor the bladder.  10 injections 1 cc each.  She tolerated very well this time.  PMH: Past Medical History:  Diagnosis Date   Allergy    Anxiety    Bursitis of both hips    Depression    Frequent headaches    Obesity    Sleep apnea    doesn't use CPAP machine broken,     Surgical History: Past Surgical History:  Procedure Laterality Date   CESAREAN SECTION     x2   CHOLECYSTECTOMY     COLONOSCOPY WITH PROPOFOL N/A 11/20/2017   Procedure: COLONOSCOPY WITH PROPOFOL;  Surgeon: Lin Landsman, MD;  Location: [facility] ENDOSCOPY;  Service: Gastroenterology;  Laterality: N/A;   HYSTERECTOMY ABDOMINAL WITH SALPINGECTOMY  1998   HYSTEROSCOPY      Home Medications:  Allergies as of 01/07/2022       Reactions   [place]per Other (See Comments)   Black pepper Feels like throat is closing, itchy Feels like  throat is closing, itchy Feels like throat is closing, itchy   Tape Rash   Paper tape is ok to use.        Medication List        Accurate as of January 07, 2022  9:13 AM. If you have any questions, ask your nurse or doctor.          albuterol 108 (90 Base) MCG/ACT inhaler Commonly known as: Ventolin HFA INHALE 2 PUFFS EVERY FOUR HOURS AS NEEDED   amLODipine 10 MG tablet Commonly known as: NORVASC Take 1 tablet (10 mg total) by mouth daily.   aspirin EC 81 MG tablet Take by mouth.   Bydureon BCise 2 MG/0.85ML Auij Generic drug: Exenatide ER INJECT 2 MG INTO SKIN EVERY WEEK   clobetasol ointment 0.05 % Commonly known as: TEMOVATE APPLY TO AFFECTED AREAS TWICE  DAILY UNTIL IMPROVED   cloNIDine 0.3 MG tablet Commonly known as: CATAPRES Take 0.3 mg by mouth 2 (two) times daily.   cyanocobalamin 500 MCG tablet Commonly known as: VITAMIN B12 Take 500 mcg by mouth daily.   diclofenac Sodium 1 % Gel Commonly known as: VOLTAREN Apply 2 g topically 4 (four) times daily as needed (arthritis hand/thumb).   estradiol 1 MG tablet Commonly known as: ESTRACE Take 1 tablet (1 mg total) by mouth daily.   fluticasone 50 MCG/ACT nasal spray Commonly known as: FLONASE 1 SPARY IN EACH NOSTRIL DAILY   gabapentin 300 MG capsule Commonly known as: NEURONTIN Take 1 capsule (300 mg total) by mouth 3 (three) times daily.   linaclotide 145 MCG Caps capsule Commonly known as: LINZESS TAKE 1 CAPSULE (145 MCG TOTAL) BY MOUTH ONCE DAILY   meloxicam 7.5 MG tablet Commonly known as: MOBIC Take 7.5 mg by mouth daily.   nortriptyline 25 MG capsule Commonly known as: PAMELOR Take by mouth.   pantoprazole 40 MG tablet Commonly known as: PROTONIX Take 1 tablet (40 mg total) by mouth 2 (two) times daily before a meal.   pregabalin 150 MG capsule Commonly known as: LYRICA Take 150 mg by mouth 3 (three) times daily.   promethazine 25 MG tablet Commonly known as: PHENERGAN Take 1 tablet (25 mg total) by mouth every 6 (six) hours as needed for nausea or vomiting.   QUEtiapine 300 MG tablet Commonly known as: SEROQUEL Take 300 mg by mouth at bedtime.   Spiriva HandiHaler 18 MCG inhalation capsule Generic drug: tiotropium INHALE CONTENTS OF ONE CAPSULE ONCE DAILY USING HANDIHALER DEVICE   sucralfate 1 g tablet Commonly known as: CARAFATE TAKE 1 TABLET FOUR TIMES A DAY WITH MEALS AND AT BEDTIME AS NEEDED ONLY   Symbicort 160-4.5 MCG/ACT inhaler Generic drug: budesonide-formoterol INHALE 2 PUFFS EVERY MORNING AND EVERY EVENING   terbinafine 250 MG tablet Commonly known as: LAMISIL Take 250 mg by mouth daily.   tiZANidine 2 MG tablet Commonly known  as: ZANAFLEX TAKE 1 TABLET THREE TIMES A DAY AS NEEDED MUSCLE SPASMS   valACYclovir 1000 MG tablet Commonly known as: VALTREX TAKE 1 TABLET BY MOUTH DAILY   Vilazodone HCl 40 MG Tabs Commonly known as: VIIBRYD Take by mouth.        Allergies:  Allergies  Allergen Reactions   [place]per Other (See Comments)    Black pepper Feels like throat is closing, itchy Feels like throat is closing, itchy Feels like throat is closing, itchy   Tape Rash    Paper tape is ok to use.    Family History: Family History  Problem Relation Age of Onset   Hodgkin's lymphoma Mother    Heart failure Maternal Grandmother    Alzheimer's disease Maternal Grandfather    Emphysema Paternal Grandmother    Diabetes Maternal Aunt    Diabetes Maternal Uncle    Cancer Other    Breast cancer Neg Hx     Social History:  reports that she has been smoking cigarettes. She has a 30.00 pack-year smoking history. She uses smokeless tobacco. She reports current alcohol use. She reports current drug use. Drug: Marijuana.  ROS:                                        Physical Exam: There were no vitals taken for this visit.  Constitutional:  Alert and oriented, No acute distress. HEENT: [place] AT, moist mucus membranes.  Trachea midline, no masses.   Laboratory Data: Lab Results  Component Value Date   WBC 5.8 09/30/2019   HGB 14.7 09/30/2019   HCT 43.8 09/30/2019   MCV 99.5 09/30/2019   PLT 245 09/30/2019    Lab Results  Component Value Date   CREATININE 0.83 09/30/2019    No results found for: "PSA"  No results found for: "TESTOSTERONE"  Lab Results  Component Value Date   HGBA1C 5.2 06/29/2020    Urinalysis    Component Value Date/Time   COLORURINE Yellow 04/20/2012 1725   APPEARANCEUR Clear 12/25/2021 1409   LABSPEC 1.010 04/20/2012 1725   PHURINE 6.0 04/20/2012 1725   GLUCOSEU Negative 12/25/2021 1409   GLUCOSEU Negative 04/20/2012 1725   HGBUR Negative  04/20/2012 1725   BILIRUBINUR Negative 12/25/2021 1409   BILIRUBINUR Negative 04/20/2012 1725   KETONESUR Negative 04/20/2012 1725   PROTEINUR Negative 12/25/2021 1409   PROTEINUR Negative 04/20/2012 1725   NITRITE Negative 12/25/2021 1409   NITRITE Negative 04/20/2012 1725   LEUKOCYTESUR Negative 12/25/2021 1409   LEUKOCYTESUR Negative 04/20/2012 1725    Pertinent Imaging:   Assessment & Plan: Patient understands that she has mild mixed incontinence and that a bladder sling may not help her urge incontinence with sudden warning and at times bedwetting.  We will have the nurse practitioner see her as per protocol but like to see her in about 2 months and review baseline her symptoms.  She actually usually does quite well with Botox.  She may need repeat urodynamics in the future.  I think we need to have reasonable treatment goals.  I think the patient at times mixes up her incontinence or gets frustrated because Botox needs to be repeated.  I also can talk about InterStim next visit as well but I want to review baseline her symptoms post Botox  There are no diagnoses linked to this encounter.  No follow-ups on file.  Reece Packer, MD  [place] [address], [place], [place] [address] [phone]

## 2022-01-08 ENCOUNTER — Other Ambulatory Visit: Payer: Self-pay | Admitting: Family Medicine

## 2022-01-08 DIAGNOSIS — M159 Polyosteoarthritis, unspecified: Secondary | ICD-10-CM

## 2022-01-08 NOTE — Telephone Encounter (Signed)
Requested medication (s) are due for refill today -no  Requested medication (s) are on the active medication list -no  Future visit scheduled -no  Last refill:06/05/21  Notes to clinic: no longer listed on current medication list- non delegated Rx  Requested Prescriptions  Pending Prescriptions Disp Refills   tiZANidine (ZANAFLEX) 2 MG tablet [Pharmacy Med Name: tiZANidine HCl 2MG  TABS*] 60 tablet 5    Sig: TAKE 1 TABLET THREE TIMES A DAY AS NEEDED MUSCLE SPASMS     Not Delegated - Cardiovascular:  Alpha-2 Agonists - tizanidine Failed - 01/08/2022  2:09 PM      Failed - This refill cannot be delegated      Passed - Valid encounter within last 6 months    Recent Outpatient Visits           4 months ago Gastroesophageal reflux disease without esophagitis   [facility] Olin Hauser, DO   11 months ago Essential hypertension   [place], DO   1 year ago Primary osteoarthritis involving multiple joints   [place], Devonne Doughty, DO   1 year ago Controlled type 2 diabetes mellitus with complication, without long-term current use of insulin ([place])   [facility] [place], Devonne Doughty, DO   1 year ago Psychophysiological insomnia   [place], Devonne Doughty, DO       Future Appointments             In 1 month MacDiarmid, Nicki Reaper, [facility] [place]               Requested Prescriptions  Pending Prescriptions Disp Refills   tiZANidine (ZANAFLEX) 2 MG tablet [Pharmacy Med Name: tiZANidine HCl 2MG  TABS*] 60 tablet 5    Sig: TAKE 1 TABLET THREE TIMES A DAY AS NEEDED MUSCLE SPASMS     Not Delegated - Cardiovascular:  Alpha-2 Agonists - tizanidine Failed - 01/08/2022  2:09 PM      Failed - This refill cannot be delegated      Passed - Valid encounter within last 6 months    Recent Outpatient Visits           4  months ago Gastroesophageal reflux disease without esophagitis   [place], DO   11 months ago Essential hypertension   [place], DO   1 year ago Primary osteoarthritis involving multiple joints   [place], DO   1 year ago Controlled type 2 diabetes mellitus with complication, without long-term current use of insulin [facility])   [facility] Olin Hauser, DO   1 year ago Psychophysiological insomnia   [place], DO       Future Appointments             In 1 month MacDiarmid, Nicki Reaper, [place]

## 2022-02-25 ENCOUNTER — Ambulatory Visit: Payer: Medicaid Other | Admitting: Urology

## 2022-02-26 ENCOUNTER — Encounter: Payer: Self-pay | Admitting: Urology

## 2022-03-09 ENCOUNTER — Emergency Department: Payer: Medicaid Other

## 2022-03-09 ENCOUNTER — Encounter (HOSPITAL_COMMUNITY): Payer: Self-pay

## 2022-03-09 ENCOUNTER — Emergency Department
Admission: EM | Admit: 2022-03-09 | Discharge: 2022-03-10 | Disposition: A | Discharge status: Other Acute Inpatient Hospital | Payer: Medicaid Other | Attending: Emergency Medicine | Admitting: Emergency Medicine

## 2022-03-09 ENCOUNTER — Inpatient Hospital Stay: Admit: 2022-03-09 | Payer: Medicaid Other | Admitting: Pulmonary Disease

## 2022-03-09 ENCOUNTER — Other Ambulatory Visit: Payer: Self-pay

## 2022-03-09 DIAGNOSIS — A419 Sepsis, unspecified organism: Secondary | ICD-10-CM | POA: Insufficient documentation

## 2022-03-09 DIAGNOSIS — I10 Essential (primary) hypertension: Secondary | ICD-10-CM | POA: Diagnosis not present

## 2022-03-09 DIAGNOSIS — E119 Type 2 diabetes mellitus without complications: Secondary | ICD-10-CM | POA: Insufficient documentation

## 2022-03-09 DIAGNOSIS — R21 Rash and other nonspecific skin eruption: Secondary | ICD-10-CM | POA: Diagnosis not present

## 2022-03-09 DIAGNOSIS — J9601 Acute respiratory failure with hypoxia: Secondary | ICD-10-CM | POA: Diagnosis not present

## 2022-03-09 DIAGNOSIS — Z79899 Other long term (current) drug therapy: Secondary | ICD-10-CM | POA: Diagnosis not present

## 2022-03-09 DIAGNOSIS — R0602 Shortness of breath: Secondary | ICD-10-CM | POA: Diagnosis present

## 2022-03-09 DIAGNOSIS — Z20822 Contact with and (suspected) exposure to covid-19: Secondary | ICD-10-CM | POA: Diagnosis not present

## 2022-03-09 DIAGNOSIS — R197 Diarrhea, unspecified: Secondary | ICD-10-CM | POA: Insufficient documentation

## 2022-03-09 DIAGNOSIS — R109 Unspecified abdominal pain: Secondary | ICD-10-CM | POA: Diagnosis not present

## 2022-03-09 DIAGNOSIS — N179 Acute kidney failure, unspecified: Secondary | ICD-10-CM

## 2022-03-09 DIAGNOSIS — R6521 Severe sepsis with septic shock: Secondary | ICD-10-CM | POA: Diagnosis not present

## 2022-03-09 LAB — CBC WITH DIFFERENTIAL/PLATELET
Abs Immature Granulocytes: 0.26 10*3/uL — ABNORMAL HIGH (ref 0.00–0.07)
Basophils Absolute: 0.1 10*3/uL (ref 0.0–0.1)
Basophils Relative: 1 %
Eosinophils Absolute: 0.2 10*3/uL (ref 0.0–0.5)
Eosinophils Relative: 1 %
HCT: 48.7 % — ABNORMAL HIGH (ref 36.0–46.0)
Hemoglobin: 16 g/dL — ABNORMAL HIGH (ref 12.0–15.0)
Immature Granulocytes: 1 %
Lymphocytes Relative: 5 %
Lymphs Abs: 1.4 10*3/uL (ref 0.7–4.0)
MCH: 32.5 pg (ref 26.0–34.0)
MCHC: 32.9 g/dL (ref 30.0–36.0)
MCV: 98.8 fL (ref 80.0–100.0)
Monocytes Absolute: 2 10*3/uL — ABNORMAL HIGH (ref 0.1–1.0)
Monocytes Relative: 7 %
Neutro Abs: 25.2 10*3/uL — ABNORMAL HIGH (ref 1.7–7.7)
Neutrophils Relative %: 85 %
Platelets: 303 10*3/uL (ref 150–400)
RBC: 4.93 MIL/uL (ref 3.87–5.11)
RDW: 16.8 % — ABNORMAL HIGH (ref 11.5–15.5)
Smear Review: NORMAL
WBC: 29.2 10*3/uL — ABNORMAL HIGH (ref 4.0–10.5)
nRBC: 0 % (ref 0.0–0.2)

## 2022-03-09 LAB — GASTROINTESTINAL PANEL BY PCR, STOOL (REPLACES STOOL CULTURE)

## 2022-03-09 LAB — LACTIC ACID, PLASMA
Lactic Acid, Venous: 5.4 mmol/L (ref 0.5–1.9)
Lactic Acid, Venous: 3.3 mmol/L (ref 0.5–1.9)
Lactic Acid, Venous: 1.8 mmol/L (ref 0.5–1.9)
Lactic Acid, Venous: 1.8 mmol/L (ref 0.5–1.9)

## 2022-03-09 LAB — BLOOD GAS, VENOUS
Acid-base deficit: 6.2 mmol/L — ABNORMAL HIGH (ref 0.0–2.0)
Bicarbonate: 21.8 mmol/L (ref 20.0–28.0)
O2 Saturation: 70.7 %
Patient temperature: 37
pCO2, Ven: 52 mmHg (ref 44–60)
pH, Ven: 7.23 — ABNORMAL LOW (ref 7.25–7.43)
pO2, Ven: 49 mmHg — ABNORMAL HIGH (ref 32–45)

## 2022-03-09 LAB — TROPONIN I (HIGH SENSITIVITY)
Troponin I (High Sensitivity): 13 ng/L (ref <18)
Troponin I (High Sensitivity): 11 ng/L (ref <18)

## 2022-03-09 LAB — COMPREHENSIVE METABOLIC PANEL
ALT: 122 U/L — ABNORMAL HIGH (ref 0–44)
AST: 311 U/L — ABNORMAL HIGH (ref 15–41)
Albumin: 3 g/dL — ABNORMAL LOW (ref 3.5–5.0)
Alkaline Phosphatase: 75 U/L (ref 38–126)
Anion gap: 20 — ABNORMAL HIGH (ref 5–15)
BUN: 34 mg/dL — ABNORMAL HIGH (ref 6–20)
CO2: 18 mmol/L — ABNORMAL LOW (ref 22–32)
Calcium: 9.9 mg/dL (ref 8.9–10.3)
Chloride: 102 mmol/L (ref 98–111)
Creatinine, Ser: 2.64 mg/dL — ABNORMAL HIGH (ref 0.44–1.00)
GFR, Estimated: 21 mL/min — ABNORMAL LOW (ref >60)
Glucose, Bld: 167 mg/dL — ABNORMAL HIGH (ref 70–99)
Potassium: 4.7 mmol/L (ref 3.5–5.1)
Sodium: 140 mmol/L (ref 135–145)
Total Bilirubin: 1.4 mg/dL — ABNORMAL HIGH (ref 0.3–1.2)
Total Protein: 6.3 g/dL — ABNORMAL LOW (ref 6.5–8.1)

## 2022-03-09 LAB — RESP PANEL BY RT-PCR (RSV, FLU A&B, COVID)  RVPGX2
Influenza A by PCR: NEGATIVE
Influenza B by PCR: NEGATIVE
Resp Syncytial Virus by PCR: NEGATIVE
SARS Coronavirus 2 by RT PCR: NEGATIVE

## 2022-03-09 LAB — TSH: TSH: 0.601 u[IU]/mL (ref 0.350–4.500)

## 2022-03-09 LAB — URINALYSIS, ROUTINE W REFLEX MICROSCOPIC
Bilirubin Urine: NEGATIVE
Glucose, UA: 50 mg/dL — AB
Hgb urine dipstick: NEGATIVE
Ketones, ur: NEGATIVE mg/dL
Leukocytes,Ua: NEGATIVE
Nitrite: NEGATIVE
Protein, ur: >=300 mg/dL — AB
Specific Gravity, Urine: 1.023 (ref 1.005–1.030)
WBC, UA: >50 WBC/hpf — ABNORMAL HIGH (ref 0–5)
pH: 6 (ref 5.0–8.0)

## 2022-03-09 LAB — URINE DRUG SCREEN, QUALITATIVE (ARMC ONLY)
Amphetamines, Ur Screen: POSITIVE — AB
Barbiturates, Ur Screen: NOT DETECTED
Benzodiazepine, Ur Scrn: NOT DETECTED
Cannabinoid 50 Ng, Ur ~~LOC~~: POSITIVE — AB
Cocaine Metabolite,Ur ~~LOC~~: NOT DETECTED
MDMA (Ecstasy)Ur Screen: NOT DETECTED
Methadone Scn, Ur: NOT DETECTED
Opiate, Ur Screen: NOT DETECTED
Phencyclidine (PCP) Ur S: NOT DETECTED
Tricyclic, Ur Screen: POSITIVE — AB

## 2022-03-09 LAB — BASIC METABOLIC PANEL
Anion gap: 12 (ref 5–15)
BUN: 37 mg/dL — ABNORMAL HIGH (ref 6–20)
CO2: 16 mmol/L — ABNORMAL LOW (ref 22–32)
Calcium: 7.7 mg/dL — ABNORMAL LOW (ref 8.9–10.3)
Chloride: 108 mmol/L (ref 98–111)
Creatinine, Ser: 2.53 mg/dL — ABNORMAL HIGH (ref 0.44–1.00)
GFR, Estimated: 22 mL/min — ABNORMAL LOW (ref >60)
Glucose, Bld: 142 mg/dL — ABNORMAL HIGH (ref 70–99)
Potassium: 4.6 mmol/L (ref 3.5–5.1)
Sodium: 136 mmol/L (ref 135–145)

## 2022-03-09 LAB — T4, FREE: Free T4: 0.45 ng/dL — ABNORMAL LOW (ref 0.61–1.12)

## 2022-03-09 LAB — C DIFFICILE QUICK SCREEN W PCR REFLEX
C Diff antigen: NEGATIVE
C Diff interpretation: NOT DETECTED
C Diff toxin: NEGATIVE

## 2022-03-09 LAB — LIPASE, BLOOD: Lipase: 88 U/L — ABNORMAL HIGH (ref 11–51)

## 2022-03-09 LAB — PROCALCITONIN: Procalcitonin: 91.61 ng/mL

## 2022-03-09 LAB — BRAIN NATRIURETIC PEPTIDE: B Natriuretic Peptide: 83.1 pg/mL (ref 0.0–100.0)

## 2022-03-09 MED ORDER — MIDAZOLAM HCL 2 MG/2ML IJ SOLN
INTRAMUSCULAR | Status: AC
  Administered 2022-03-09: 2 mg via INTRAVENOUS
  Filled 2022-03-09: qty 2

## 2022-03-09 MED ORDER — VANCOMYCIN HCL IN DEXTROSE 1-5 GM/200ML-% IV SOLN
1000.0000 mg | Freq: Once | INTRAVENOUS | Status: AC
  Administered 2022-03-09: 1000 mg via INTRAVENOUS
  Filled 2022-03-09: qty 200

## 2022-03-09 MED ORDER — LORAZEPAM 2 MG/ML IJ SOLN
1.0000 mg | Freq: Once | INTRAMUSCULAR | Status: AC
  Administered 2022-03-10: 1 mg via INTRAVENOUS
  Filled 2022-03-09: qty 1

## 2022-03-09 MED ORDER — DIPHENHYDRAMINE HCL 50 MG/ML IJ SOLN
50.0000 mg | Freq: Once | INTRAMUSCULAR | Status: AC
  Administered 2022-03-09: 50 mg via INTRAVENOUS
  Filled 2022-03-09: qty 1

## 2022-03-09 MED ORDER — LACTATED RINGERS IV SOLN: INTRAVENOUS | Status: DC

## 2022-03-09 MED ORDER — FENTANYL CITRATE PF 50 MCG/ML IJ SOSY
50.0000 ug | PREFILLED_SYRINGE | Freq: Once | INTRAMUSCULAR | Status: AC
  Administered 2022-03-09: 50 ug via INTRAVENOUS
  Filled 2022-03-09: qty 1

## 2022-03-09 MED ORDER — FAMOTIDINE IN NACL 20-0.9 MG/50ML-% IV SOLN
20.0000 mg | Freq: Once | INTRAVENOUS | Status: AC
  Administered 2022-03-09: 20 mg via INTRAVENOUS
  Filled 2022-03-09: qty 50

## 2022-03-09 MED ORDER — SODIUM CHLORIDE 0.9 % IV BOLUS (SEPSIS)
500.0000 mL | Freq: Once | INTRAVENOUS | Status: AC
  Administered 2022-03-09: 500 mL via INTRAVENOUS

## 2022-03-09 MED ORDER — SODIUM CHLORIDE 0.9 % IV BOLUS
1000.0000 mL | Freq: Once | INTRAVENOUS | Status: AC
  Administered 2022-03-09: 1000 mL via INTRAVENOUS

## 2022-03-09 MED ORDER — LACTATED RINGERS IV BOLUS
1000.0000 mL | Freq: Once | INTRAVENOUS | Status: AC
  Administered 2022-03-09: 1000 mL via INTRAVENOUS

## 2022-03-09 MED ORDER — METRONIDAZOLE 500 MG/100ML IV SOLN
500.0000 mg | Freq: Once | INTRAVENOUS | Status: AC
  Administered 2022-03-09: 500 mg via INTRAVENOUS
  Filled 2022-03-09: qty 100

## 2022-03-09 MED ORDER — FENTANYL CITRATE PF 50 MCG/ML IJ SOSY
PREFILLED_SYRINGE | INTRAMUSCULAR | Status: AC
  Administered 2022-03-09: 50 ug via INTRAVENOUS
  Filled 2022-03-09: qty 1

## 2022-03-09 MED ORDER — NOREPINEPHRINE 4 MG/250ML-% IV SOLN
0.0000 ug/min | INTRAVENOUS | Status: DC
  Administered 2022-03-09: 2 ug/min via INTRAVENOUS
  Filled 2022-03-09 (×2): qty 250

## 2022-03-09 MED ORDER — MIDAZOLAM HCL 2 MG/2ML IJ SOLN: 2.0000 mg | Freq: Once | INTRAMUSCULAR | Status: AC

## 2022-03-09 MED ORDER — SODIUM CHLORIDE 0.9 % IV SOLN
2.0000 g | Freq: Once | INTRAVENOUS | Status: AC
  Administered 2022-03-09: 2 g via INTRAVENOUS
  Filled 2022-03-09: qty 12.5

## 2022-03-09 MED ORDER — MORPHINE SULFATE (PF) 4 MG/ML IV SOLN
4.0000 mg | Freq: Once | INTRAVENOUS | Status: AC
  Administered 2022-03-09: 4 mg via INTRAVENOUS
  Filled 2022-03-09: qty 1

## 2022-03-09 MED ORDER — METHYLPREDNISOLONE SODIUM SUCC 125 MG IJ SOLR
125.0000 mg | Freq: Once | INTRAMUSCULAR | Status: AC
  Administered 2022-03-09: 125 mg via INTRAVENOUS
  Filled 2022-03-09: qty 2

## 2022-03-09 MED ORDER — MORPHINE SULFATE (PF) 4 MG/ML IV SOLN
4.0000 mg | Freq: Once | INTRAVENOUS | Status: AC
  Administered 2022-03-10: 4 mg via INTRAVENOUS
  Filled 2022-03-09: qty 1

## 2022-03-09 MED ORDER — FENTANYL CITRATE PF 50 MCG/ML IJ SOSY: 50.0000 ug | PREFILLED_SYRINGE | Freq: Once | INTRAMUSCULAR | Status: AC

## 2022-03-09 MED ORDER — SODIUM CHLORIDE 0.9 % IV BOLUS: 1000.0000 mL | Freq: Once | INTRAVENOUS | Status: DC

## 2022-03-09 MED ORDER — PIPERACILLIN-TAZOBACTAM 3.375 G IVPB: 3.3750 g | Freq: Three times a day (TID) | INTRAVENOUS | Status: DC

## 2022-03-09 NOTE — ED Notes (Signed)
Called [facility] for update on bed assignment. Still waiting for bed to become available.

## 2022-03-09 NOTE — H&P (Incomplete)
NAME:  Darlene Barber, MRN:  [id number], DOB:  [birth year], LOS: 0 ADMISSION DATE:  12/30 CONSULTATION DATE:  12/30 REFERRING MD:  Dr. Cherylann Banas, CHIEF COMPLAINT:  shock   History of Present Illness:  Patient is a 56 yo female w/ pertinent PMH HTN, T2DM, OSA, urge incontinence, bipolar depression presents to [facility] ED on 12/30 w/ rash and sob.   Patient states on 12/30 she woke up with rash and sob. Also had episode of vomiting and diarrhea. Patient denies any new exposures and no new medications. She came into [facility] with 12/30 for further eval.  Upon arrival on 12/30 patient AO. Some SOB w/ hypoxia improved on [place]. Intial bp stable 133/38, HR 58. Afebrile. Diffuse maculopapular rash appreciated. Treated w/ benadryl and IM epi. BP became soft and despite IV fluids requiring levo. WBC 29 and LA 5.4 then 3.3 post fluid resuscitation. Cultures obtained and started on cefepime, vanc, flagyl. Cdif negative. Covid/flu/rsv negative. GI panel pending. CXR small pleural effusion vs. Atelectasis. CT chest/abd/pelvis bowel full of fluid likely related to diarrhea; no bowel wall thickening. Elevated LFTs and creat.   Pertinent  Medical History   Past Medical History:  Diagnosis Date   Allergy    Anxiety    Bursitis of both hips    Depression    Frequent headaches    Obesity    Sleep apnea    doesn't use CPAP machine broken,      Significant Hospital Events: Including procedures, antibiotic start and stop dates in addition to other pertinent events   12/30 admitted to [facility] w/ rash and sob; on levo; transferring to [facility]  Interim History / Subjective:  ***  Objective   There were no vitals taken for this visit.        Intake/Output Summary (Last 24 hours) at 03/09/2022 2008 Last data filed at 03/09/2022 1752 Gross per 24 hour  Intake 4050 ml  Output 30 ml  Net 4020 ml   There were no vitals filed for this visit.  Examination: General: *** HENT: *** Lungs: *** Cardiovascular:  *** Abdomen: *** Extremities: *** Neuro: *** GU: ***  Resolved Hospital Problem list     Assessment & Plan:  Shock: possible sepsis from infectious diarrhea; anaphylaxis? Plan: -continue levo for map goal >65; consider adding epi if concern for anaphylactic shock -consider more iv fluids if appears dehydrated -continue broad spectrum abx -f/u cultures, ua, gi panel -trend wbc/fever curve  Acute respiratory failure w/ hypoxia Bronchitis?: on symbicort and spiriva OSA Plan: -wean [place] for sats >92% -cpap qhs -pulm toiletry -brovana, pulmicort, yupelri. Prn albuterol for wheezing  AKI AG acidosis Urinary retention Plan: -Trend BMP / urinary output -Replace electrolytes as indicated -Avoid nephrotoxic agents, ensure adequate renal perfusion  Diarrhea Plan: -prn zofran for nausea -Cdif negative; GI panel pending -hold home linaclotide   Maculopapular rash: thought to be related to infectious diarrhea Plan: -benadryl and IM epi given -closely monitor for signs of anaphylaxis  Elevated LFTs: shock liver? Plan: -trend CMP -CT abd unremarkable -consider RUQ [place] and hepatitis panel  T2DM Plan: -ssi and cbg monitoring -check a1c  HTN Plan: -hold home norvasc and clonidine -resume asa  Anxiety Bipolar Depression Plan: -hold home nortriptyline, vilazodone, seroquel  GERD Plan: -H2B -hold sucralfate  Best Practice (right click and "Reselect all SmartList Selections" daily)   Diet/type: NPO w/ oral meds DVT prophylaxis: prophylactic heparin  GI prophylaxis: H2B Lines: Central line Foley:  N/A Code Status:  full code Last  date of multidisciplinary goals of care discussion [***]  Labs   CBC: Recent Labs  Lab 03/09/22 1350  WBC 29.2*  NEUTROABS 25.2*  HGB 16.0*  HCT 48.7*  MCV 98.8  PLT 967    Basic Metabolic Panel: Recent Labs  Lab 03/09/22 1350  NA 140  K 4.7  CL 102  CO2 18*  GLUCOSE 167*  BUN 34*  CREATININE 2.64*  CALCIUM 9.9    GFR: Estimated Creatinine Clearance: 20.8 mL/min (A) (by C-G formula based on SCr of 2.64 mg/dL (H)). Recent Labs  Lab 03/09/22 1350 03/09/22 1407 03/09/22 1752  WBC 29.2*  --   --   LATICACIDVEN  --  5.4* 3.3*    Liver Function Tests: Recent Labs  Lab 03/09/22 1350  AST 311*  ALT 122*  ALKPHOS 75  BILITOT 1.4*  PROT 6.3*  ALBUMIN 3.0*   No results for input(s): "LIPASE", "AMYLASE" in the last 168 hours. No results for input(s): "AMMONIA" in the last 168 hours.  ABG No results found for: "PHART", "PCO2ART", "PO2ART", "HCO3", "TCO2", "ACIDBASEDEF", "O2SAT"   Coagulation Profile: No results for input(s): "INR", "PROTIME" in the last 168 hours.  Cardiac Enzymes: No results for input(s): "CKTOTAL", "CKMB", "CKMBINDEX", "TROPONINI" in the last 168 hours.  HbA1C: Hemoglobin A1C  Date/Time Value Ref Range Status  06/29/2020 09:59 AM 5.2 4.0 - 5.6 % Final  05/31/2019 04:44 PM 5.7 (A) 4.0 - 5.6 % Final   Hgb A1c MFr Bld  Date/Time Value Ref Range Status  11/24/2018 10:08 AM 6.0 (H) <5.7 % of total Hgb Final    Comment:    For someone without known diabetes, a hemoglobin  A1c value between 5.7% and 6.4% is consistent with prediabetes and should be confirmed with a  follow-up test. . For someone with known diabetes, a value <7% indicates that their diabetes is well controlled. A1c targets should be individualized based on duration of diabetes, age, comorbid conditions, and other considerations. . This assay result is consistent with an increased risk of diabetes. . Currently, no consensus exists regarding use of hemoglobin A1c for diagnosis of diabetes for children. [name]   05/22/2018 09:36 AM 7.0 (H) <5.7 % of total Hgb Final    Comment:    For someone without known diabetes, a hemoglobin A1c value of 6.5% or greater indicates that they may have  diabetes and this should be confirmed with a follow-up  test. . For someone with known diabetes, a value <7%  indicates  that their diabetes is well controlled and a value  greater than or equal to 7% indicates suboptimal  control. A1c targets should be individualized based on  duration of diabetes, age, comorbid conditions, and  other considerations. . Currently, no consensus exists regarding use of hemoglobin A1c for diagnosis of diabetes for children. .     CBG: No results for input(s): "GLUCAP" in the last 168 hours.  Review of Systems:   ***  Past Medical History:  She,  has a past medical history of Allergy, Anxiety, Bursitis of both hips, Depression, Frequent headaches, Obesity, and Sleep apnea.   Surgical History:   Past Surgical History:  Procedure Laterality Date   CESAREAN SECTION     x2   CHOLECYSTECTOMY     COLONOSCOPY WITH PROPOFOL N/A 11/20/2017   Procedure: COLONOSCOPY WITH PROPOFOL;  Surgeon: Lin Landsman, MD;  Location[facility] ENDOSCOPY;  Service: Gastroenterology;  Laterality: N/A;   HYSTERECTOMY ABDOMINAL WITH SALPINGECTOMY  1998   HYSTEROSCOPY  Social History:   reports that she has been smoking cigarettes. She has a 30.00 pack-year smoking history. She uses smokeless tobacco. She reports current alcohol use. She reports current drug use. Drug: Marijuana.   Family History:  Her family history includes Alzheimer's disease in her maternal grandfather; Cancer in an other family member; Diabetes in her maternal aunt and maternal uncle; Emphysema in her paternal grandmother; Heart failure in her maternal grandmother; Hodgkin's lymphoma in her mother. There is no history of Breast cancer.   Allergies Allergies  Allergen Reactions   [place] Other (See Comments)    Black pepper Feels like throat is closing, itchy Feels like throat is closing, itchy Feels like throat is closing, itchy   Tape Rash    Paper tape is ok to use.     Home Medications  Prior to Admission medications   Medication Sig Start Date End Date Taking? Authorizing Provider  albuterol  (VENTOLIN HFA) 108 (90 Base) MCG/ACT inhaler INHALE 2 PUFFS EVERY FOUR HOURS AS NEEDED 10/08/21   [place], Devonne Doughty, DO  amLODipine (NORVASC) 10 MG tablet Take 1 tablet (10 mg total) by mouth daily. 07/04/21   Karamalegos, Devonne Doughty, DO  aspirin EC 81 MG tablet Take by mouth. 09/25/21 09/25/22  [provider]  BYDUREON BCISE 2 MG/0.85ML AUIJ INJECT 2 MG INTO SKIN EVERY WEEK 09/07/21   Karamalegos, Devonne Doughty, DO  clobetasol ointment (TEMOVATE) 0.05 % APPLY TO AFFECTED AREAS TWICE DAILY UNTIL IMPROVED 12/04/20   [place] Ranger, Devonne Doughty, DO  cloNIDine (CATAPRES) 0.3 MG tablet Take 0.3 mg by mouth 2 (two) times daily. 02/25/20   [provider]  diclofenac Sodium (VOLTAREN) 1 % GEL Apply 2 g topically 4 (four) times daily as needed (arthritis hand/thumb). 11/08/20   Karamalegos, Devonne Doughty, DO  estradiol (ESTRACE) 1 MG tablet Take 1 tablet (1 mg total) by mouth daily. 63/78/58   Copland, Deirdre Evener, PA-C  fluticasone (FLONASE) 50 MCG/ACT nasal spray 1 SPARY IN EACH NOSTRIL DAILY 10/08/21   Karamalegos, Devonne Doughty, DO  gabapentin (NEURONTIN) 300 MG capsule Take 1 capsule (300 mg total) by mouth 3 (three) times daily. 12/18/21 85/0/27  Copland, Deirdre Evener, PA-C  linaclotide (LINZESS) 145 MCG CAPS capsule TAKE 1 CAPSULE (145 MCG TOTAL) BY MOUTH ONCE DAILY 01/03/20   [provider]  meloxicam (MOBIC) 7.5 MG tablet Take 7.5 mg by mouth daily. 10/15/21   [provider]  nortriptyline (PAMELOR) 25 MG capsule Take by mouth. 10/24/20   [provider]  pantoprazole (PROTONIX) 40 MG tablet Take 1 tablet (40 mg total) by mouth 2 (two) times daily before a meal. 09/03/21   Karamalegos, Devonne Doughty, DO  pregabalin (LYRICA) 150 MG capsule Take 150 mg by mouth 3 (three) times daily. 06/05/21   [provider]  promethazine (PHENERGAN) 25 MG tablet Take 1 tablet (25 mg total) by mouth every 6 (six) hours as needed for nausea or vomiting. 09/03/21   [place] Ranger,  Devonne Doughty, DO  QUEtiapine (SEROQUEL) 300 MG tablet Take 300 mg by mouth at bedtime. 06/04/21   [provider]  sucralfate (CARAFATE) 1 g tablet TAKE 1 TABLET FOUR TIMES A DAY WITH MEALS AND AT BEDTIME AS NEEDED ONLY 06/04/21   Jearld Fenton, NP  SYMBICORT 160-4.5 MCG/ACT inhaler INHALE 2 PUFFS EVERY MORNING AND EVERY EVENING 10/18/21   Karamalegos, Devonne Doughty, DO  terbinafine (LAMISIL) 250 MG tablet Take 250 mg by mouth daily. 06/04/21   [provider]  tiotropium (SPIRIVA HANDIHALER)  18 MCG inhalation capsule INHALE CONTENTS OF ONE CAPSULE ONCE DAILY USING HANDIHALER DEVICE 10/08/21   [place] Ranger, Devonne Doughty, DO  valACYclovir (VALTREX) 1000 MG tablet TAKE 1 TABLET BY MOUTH DAILY 07/04/21   Karamalegos, Devonne Doughty, DO  Vilazodone HCl (VIIBRYD) 40 MG TABS Take by mouth. 10/24/21   [provider]     Critical care time: ***   ***

## 2022-03-09 NOTE — ED Notes (Signed)
pt accepted to [facility] ICU bed 1232 per Joelene Millin, coordinator. Call report to [phone]. [facility] to transport when truck is available.

## 2022-03-09 NOTE — ED Notes (Signed)
Pt ambulated to bath room with assistance, diarrhea episode.

## 2022-03-09 NOTE — ED Provider Notes (Signed)
-----------------------------------------   4:38 PM on 03/09/2022 ----------------------------------------- Patient accepted and sign off.  She is going to get her CT.  Her blood pressures dropped again I am giving her 1 more liter fluid and if that does not maintain her blood pressure within acceptable range we will start Levophed.  Then she will need an ICU bed and transfer out as our ICU is full.   Nena Polio, MD 03/09/22 [phone]

## 2022-03-09 NOTE — ED Notes (Signed)
EMTALA reviewed by this RN.  

## 2022-03-09 NOTE — ED Triage Notes (Signed)
Pt BIBA, c/o allergic Rx to unknown source. Pt stated she awoke with hives all over her body. Pt states that she has had numerous episodes of vomiting and diarrhea today as well. EMS found pt with O2 sats at 77 % on RA. Started highflow with NRB with supplemental via [place] needed to increase to 90%. Pt c/o abdominal pain.

## 2022-03-09 NOTE — Consult Note (Incomplete)
NAME:  Darlene Barber, MRN:  [id number], DOB:  11/16/1965, LOS: 0 ADMISSION DATE:  03/09/2022, CONSULTATION DATE:  03/09/2022 REFERRING MD:  Conni Slipper  CHIEF COMPLAINT:  Abdominal Pain   HPI  56  y.o with significant PMH of chronic pain syndrome, cervical spondylitis, GERD, Bipolar Disorder, current everyday smoker and Marijuana use  who presented to the ED with chief complaints of abdominal pain and widespread generalized rash.  Per patient's significant other who is currently at the bedside, symptoms developed a day after patient ate at a local restaurant in [place]. [name] significant other report that patient was up all night with crampy abdominal pain associated with nausea and vomiting. Today at around 2 pm, they noticed a widespread rash all her her body including her chin area with now worsening pain prompting him to call EMS.    ED Course: Initial vital signs showed HR of 58 beats/minute, BP mm 133/38 Hg, the RR 22 , and the oxygen saturation 87% on 2 L and a temperature of 97.1F (36.4C).  Pertinent labs revealed elevated white count  WBC 29.2, electrolytes (sodium 140 mmol/L, potassium 4.7?mmol/L, there was acute kidney injury (urea 13.4?mmol/L, BUN/creatinine 34./2.64, eGFR 21, anion gap metabolic acidosis, and liver function tests showed a mixed hepatitic and cholestatic picture (Bilirubin 1.4, AST 311?U/L, ALT 122 U/L, ALP 75 U/L, and albumin 3.0). . Stool samples were sent as work up of acute diarrhoea and campylobacter species was isolated. The stool samples were negative for Giardia, Cryptosporidium, Salmonella, Shigella and E. coli. Clostridium difficile toxin was negative. Patient given 30 cc/kg of fluids and started on broad-spectrum antibiotics Vanco cefepime and Flagyl for  suspected sepsis of intraabdominal source.  Patient remained hypotensive despite IVF boluses therefore was started on Levophed.  PCCM consulted to assist with management pending transfer to [facility] as there  were no beds at [facility].  Past Medical History  chronic pain syndrome, cervical spondylitis, GERD, Bipolar Disorder, current everyday smoker and Marijuana use  Significant Hospital Events   12/30:   Consults:  GI  Procedures:  12/30: Left IJ Central Line  Significant Diagnostic Tests:  12/30: Chest Xray>Cardiomegaly. Increased interstitial markings are seen in parahilar regions and lower lung fields suggesting possible mild interstitial edema. There are linear densities in left lower lung fields suggesting subsegmental atelectasis. Possible minimal bilateral pleural effusions. 12/30: CT Chest abdomen and pelvis>1. Fluid throughout the stomach, small bowel and colon, nonspecific but may represent a diarrheal state. No bowel obstruction, definite bowel wall thickening or pneumoperitoneum. 2. Fluid within the esophagus which may represent reflux or nonspecific esophageal motility disorder. 3. Trace amount of free pelvic fluid, nonspecific. 4. Punctate nonobstructing bilateral renal calculi. 5.  Aortic Atherosclerosis (ICD10-I70.0).  Micro Data:  12/30: SARS-CoV-2 PCR> negative 12/30: Influenza PCR> negative 12/30: Blood culture x2> 12/30: Urine Culture> 12/30: MRSA PCR>>   Antimicrobials:  Vancomycin 12/30 x 1 Cefepime 12/30 x 1 Metronidazole 12/30 x 1 Zosyn 12/30>  OBJECTIVE  Blood pressure (!) 84/60, pulse (!) 102, temperature (!) 97.5 F (36.4 C), resp. rate (!) 21, height _0  (1.473 m), weight 77.1 kg, SpO2 98 %.        Intake/Output Summary (Last 24 hours) at 03/09/2022 2116 Last data filed at 03/09/2022 1752 Gross per 24 hour  Intake 4050 ml  Output 30 ml  Net 4020 ml   Filed Weights   03/09/22 1312  Weight: 77.1 kg   Physical Examination  GENERAL: year-old critically ill patient lying in the bed  with no acute distress.  EYES: Pupils equal, round, reactive to light and accommodation. No scleral icterus. Extraocular muscles intact.  HEENT: Head atraumatic,  normocephalic. Oropharynx and nasopharynx clear. Appears dehydrated with dry mucous membrane NECK:  Supple, no jugular venous distention. No thyroid enlargement, no tenderness.  LUNGS: Normal breath sounds bilaterally, no wheezing, rales,rhonchi or crepitation. No use of accessory muscles of respiration.  CARDIOVASCULAR: S1, S2 normal. No murmurs, rubs, or gallops.  ABDOMEN: Abdomen  mostly tender in the umbilical and epigastric region but there were no signs of peritonism. No palpable organomegaly or mass.  EXTREMITIES: Upper and lower extremities are atraumatic in appearance without tenderness or deformity. Moves all extremities spontaneously. Capillary refill > 3 seconds in all extremities. Pulses palpable distally. NEUROLOGIC:The patient is awake, alert and oriented to person, place, and time with normal speech. Motor function is normal with muscle strength 5/5 bilaterally to upper and lower extremities. Sensation is intact bilaterally.. Cranial nerves are intact. Gait not checked.  PSYCHIATRIC: Appropriate mood and affect.  SKIN: The rash was well defined with circular target lesions on her torso, back, abdomen, upper and lower limbs.            Labs/imaging that I havepersonally reviewed  (right click and "Reselect all SmartList Selections" daily)     Labs   CBC: Recent Labs  Lab 03/09/22 1350  WBC 29.2*  NEUTROABS 25.2*  HGB 16.0*  HCT 48.7*  MCV 98.8  PLT 097    Basic Metabolic Panel: Recent Labs  Lab 03/09/22 1350 03/09/22 1948  NA 140 136  K 4.7 4.6  CL 102 108  CO2 18* 16*  GLUCOSE 167* 142*  BUN 34* 37*  CREATININE 2.64* 2.53*  CALCIUM 9.9 7.7*   GFR: Estimated Creatinine Clearance: 21.7 mL/min (A) (by C-G formula based on SCr of 2.53 mg/dL (H)). Recent Labs  Lab 03/09/22 1350 03/09/22 1407 03/09/22 1752  WBC 29.2*  --   --   LATICACIDVEN  --  5.4* 3.3*    Liver Function Tests: Recent Labs  Lab 03/09/22 1350  AST 311*  ALT 122*  ALKPHOS  75  BILITOT 1.4*  PROT 6.3*  ALBUMIN 3.0*   No results for input(s): "LIPASE", "AMYLASE" in the last 168 hours. No results for input(s): "AMMONIA" in the last 168 hours.  ABG No results found for: "PHART", "PCO2ART", "PO2ART", "HCO3", "TCO2", "ACIDBASEDEF", "O2SAT"   Coagulation Profile: No results for input(s): "INR", "PROTIME" in the last 168 hours.  Cardiac Enzymes: No results for input(s): "CKTOTAL", "CKMB", "CKMBINDEX", "TROPONINI" in the last 168 hours.  HbA1C: Hemoglobin A1C  Date/Time Value Ref Range Status  06/29/2020 09:59 AM 5.2 4.0 - 5.6 % Final  05/31/2019 04:44 PM 5.7 (A) 4.0 - 5.6 % Final   Hgb A1c MFr Bld  Date/Time Value Ref Range Status  11/24/2018 10:08 AM 6.0 (H) <5.7 % of total Hgb Final    Comment:    For someone without known diabetes, a hemoglobin  A1c value between 5.7% and 6.4% is consistent with prediabetes and should be confirmed with a  follow-up test. . For someone with known diabetes, a value <7% indicates that their diabetes is well controlled. A1c targets should be individualized based on duration of diabetes, age, comorbid conditions, and other considerations. . This assay result is consistent with an increased risk of diabetes. . Currently, no consensus exists regarding use of hemoglobin A1c for diagnosis of diabetes for children. [name]   05/22/2018 09:36 AM 7.0 (H) <5.7 %  of total Hgb Final    Comment:    For someone without known diabetes, a hemoglobin A1c value of 6.5% or greater indicates that they may have  diabetes and this should be confirmed with a follow-up  test. . For someone with known diabetes, a value <7% indicates  that their diabetes is well controlled and a value  greater than or equal to 7% indicates suboptimal  control. A1c targets should be individualized based on  duration of diabetes, age, comorbid conditions, and  other considerations. . Currently, no consensus exists regarding use of hemoglobin A1c for  diagnosis of diabetes for children. .     CBG: No results for input(s): "GLUCAP" in the last 168 hours.  Review of Systems:   ***  Past Medical History  She,  has a past medical history of Allergy, Anxiety, Bursitis of both hips, Depression, Frequent headaches, Obesity, and Sleep apnea.   Surgical History    Past Surgical History:  Procedure Laterality Date   CESAREAN SECTION     x2   CHOLECYSTECTOMY     COLONOSCOPY WITH PROPOFOL N/A 11/20/2017   Procedure: COLONOSCOPY WITH PROPOFOL;  Surgeon: Lin Landsman, MD;  [facility] ENDOSCOPY;  Service: Gastroenterology;  Laterality: N/A;   HYSTERECTOMY ABDOMINAL WITH SALPINGECTOMY  1998   HYSTEROSCOPY       Social History   reports that she has been smoking cigarettes. She has a 30.00 pack-year smoking history. She uses smokeless tobacco. She reports current alcohol use. She reports current drug use. Drug: Marijuana.   Family History   Her family history includes Alzheimer's disease in her maternal grandfather; Cancer in an other family member; Diabetes in her maternal aunt and maternal uncle; Emphysema in her paternal grandmother; Heart failure in her maternal grandmother; Hodgkin's lymphoma in her mother. There is no history of Breast cancer.   Allergies Allergies  Allergen Reactions   Piper Other (See Comments)    Black pepper Feels like throat is closing, itchy Feels like throat is closing, itchy Feels like throat is closing, itchy   Tape Rash    Paper tape is ok to use.     Home Medications  Prior to Admission medications   Medication Sig Start Date End Date Taking? Authorizing Provider  albuterol (VENTOLIN HFA) 108 (90 Base) MCG/ACT inhaler INHALE 2 PUFFS EVERY FOUR HOURS AS NEEDED 10/08/21   Parks Ranger, Devonne Doughty, DO  amLODipine (NORVASC) 10 MG tablet Take 1 tablet (10 mg total) by mouth daily. 07/04/21   Karamalegos, Devonne Doughty, DO  aspirin EC 81 MG tablet Take by mouth. 09/25/21 09/25/22  [provider]  BYDUREON BCISE 2 MG/0.85ML AUIJ INJECT 2 MG INTO SKIN EVERY WEEK 09/07/21   Karamalegos, Devonne Doughty, DO  clobetasol ointment (TEMOVATE) 0.05 % APPLY TO AFFECTED AREAS TWICE DAILY UNTIL IMPROVED 12/04/20   Parks Ranger, Devonne Doughty, DO  cloNIDine (CATAPRES) 0.3 MG tablet Take 0.3 mg by mouth 2 (two) times daily. 02/25/20   [provider]  diclofenac Sodium (VOLTAREN) 1 % GEL Apply 2 g topically 4 (four) times daily as needed (arthritis hand/thumb). 11/08/20   Karamalegos, Devonne Doughty, DO  estradiol (ESTRACE) 1 MG tablet Take 1 tablet (1 mg total) by mouth daily. 84/16/60   Copland, Deirdre Evener, PA-C  fluticasone (FLONASE) 50 MCG/ACT nasal spray 1 SPARY IN EACH NOSTRIL DAILY 10/08/21   Karamalegos, Devonne Doughty, DO  gabapentin (NEURONTIN) 300 MG capsule Take 1 capsule (300 mg total) by mouth 3 (three) times daily. 12/18/21 12/18/22  Copland, Deirdre Evener, PA-C  linaclotide (LINZESS) 145 MCG CAPS capsule TAKE 1 CAPSULE (145 MCG TOTAL) BY MOUTH ONCE DAILY 01/03/20   [provider]  meloxicam (MOBIC) 7.5 MG tablet Take 7.5 mg by mouth daily. 10/15/21   [provider]  nortriptyline (PAMELOR) 25 MG capsule Take by mouth. 10/24/20   [provider]  pantoprazole (PROTONIX) 40 MG tablet Take 1 tablet (40 mg total) by mouth 2 (two) times daily before a meal. 09/03/21   Karamalegos, Devonne Doughty, DO  pregabalin (LYRICA) 150 MG capsule Take 150 mg by mouth 3 (three) times daily. 06/05/21   [provider]  promethazine (PHENERGAN) 25 MG tablet Take 1 tablet (25 mg total) by mouth every 6 (six) hours as needed for nausea or vomiting. 09/03/21   Parks Ranger, Devonne Doughty, DO  QUEtiapine (SEROQUEL) 300 MG tablet Take 300 mg by mouth at bedtime. 06/04/21   [provider]  sucralfate (CARAFATE) 1 g tablet TAKE 1 TABLET FOUR TIMES A DAY WITH MEALS AND AT BEDTIME AS NEEDED ONLY 06/04/21   Jearld Fenton, NP  SYMBICORT 160-4.5 MCG/ACT inhaler INHALE 2 PUFFS EVERY  MORNING AND EVERY EVENING 10/18/21   Karamalegos, Devonne Doughty, DO  terbinafine (LAMISIL) 250 MG tablet Take 250 mg by mouth daily. 06/04/21   [provider]  tiotropium (SPIRIVA HANDIHALER) 18 MCG inhalation capsule INHALE CONTENTS OF ONE CAPSULE ONCE DAILY USING HANDIHALER DEVICE 10/08/21   Parks Ranger, Devonne Doughty, DO  valACYclovir (VALTREX) 1000 MG tablet TAKE 1 TABLET BY MOUTH DAILY 07/04/21   Karamalegos, Devonne Doughty, DO  Vilazodone HCl (VIIBRYD) 40 MG TABS Take by mouth. 10/24/21   [provider]   Scheduled Meds:  diphenhydrAMINE  50 mg Intravenous Once   fentaNYL       lidocaine HCl (PF)  50 mL Other Once   methylPREDNISolone (SOLU-MEDROL) injection  125 mg Intravenous Once   midazolam       Continuous Infusions:  famotidine (PEPCID) IV     lactated ringers Stopped (03/09/22 2134)   norepinephrine (LEVOPHED) Adult infusion 15 mcg/min (03/09/22 2159)   PRN Meds:.fentaNYL, midazolam  Active Hospital Problem list     Assessment & Plan:   #Acute Diarrhea  # Concerns for Infectious Causes -work up of acute diarrhoea negative for campylobacter Giardia, Cryptosporidium, Salmonella, Shigella and E. coli.and Clostridium difficile toxin  -ESR, CRP, Lipase  -Check TSH, Free T4, UDS -PO and IV rehydration and electrolyte repletion  #Rash  #Sepsis in the setting of Suspected Acute Infectious Diarrhea Lactic: ***, Baseline PCT: ***, Initial interventions/workup included: 3 L of NS/LR & Cefepime/ Vancomycin/ Metronidazole -Supplemental oxygen as needed, to maintain SpO2 > 90% -F/u cultures, trend lactic/ PCT -Monitor WBC/ fever curve -IVF hydration as needed -Pressors for MAP goal >65 -Strict I/O's   # AKI likely ATN in the setting of above # Anion gap metabolic acidosis with Lactic Acidosis -Monitor I&O's / urinary output -Follow BMP -Ensure adequate renal perfusion -Avoid nephrotoxic agents as able -Replace electrolytes as indicated    #Transaminitis ?early ischemic.  - trend      Best practice:  Diet:  {WCHE:52778} Pain/Anxiety/Delirium protocol (if indicated): {Pain/Anxiety/Delirium:26941} VAP protocol (if indicated): {VAP:29640} DVT prophylaxis: {DVT Prophylaxis:26933} GI prophylaxis: {GI:26934} Glucose control:  {Glucose Control:26935} Central venous access:  {Central Venous Access:26936} Arterial line:  {Central Venous Access:26936} Foley:  {Central Venous Access:26936} Mobility:  {Mobility:26937}  PT consulted: {PT Consult:26938} Last date of multidisciplinary goals of care discussion [***] Code Status:  {Code Status:26939} Disposition: ***   =  Goals of Care = Code Status Order: FULL  Primary Emergency Contact: Dismore,Aaron Wishes to pursue full aggressive treatment and intervention options, including CPR and intubation, but goals of care will be addressed on going with family if that should become necessary.   Critical care time: 45 minutes       [name]  DNP, CCRN, FNP-C, AGACNP-BC Acute Care Nurse Practitioner [facility]  PCCM on call pager [phone] until 7 am

## 2022-03-09 NOTE — ED Provider Notes (Signed)
[facility] Provider Note    Event Date/Time   First MD Initiated Contact with Patient 03/09/22 1352     (approximate)   History   Allergic Reaction   HPI  Darlene Barber is a 56 y.o. female with a history of osteoarthritis, GERD, hypertension, morbid obesity, type 2 diabetes, urge incontinence, migraines, OSA, and depression who presents with a rash and shortness of breath and concern for allergic reaction.  The patient states that she woke up this morning with her symptoms.  She states that yesterday she was in her usual state of health except for feeling somewhat fatigued.  She had some difficulty going to the bathroom during the night although this is normal for her.  She then awoke and found the rash all over her body and felt short of breath.  She had multiple episodes of vomiting and diarrhea as well.  The patient states that the rash is not itchy or painful.  She has no rash on her palms or soles.  She denies any fever.  She has no tightness in her throat but states she did have some difficulty swallowing earlier.  She has no significant abdominal or chest pain.  She denies any prior history of similar allergic reactions or rashes.  She denies any new exposures that she is aware of.  She did not eat anything different than normal last night.  I reviewed the past medical records.  The patient has no recent ED visits or admissions.  Her most recent outpatient encounter was with [facility] on 10/30 for refractory urge incontinence.   Physical Exam   Triage Vital Signs: ED Triage Vitals  Enc Vitals Group     BP 03/09/22 1308 (!) 133/38     Pulse Rate 03/09/22 1308 (!) 58     Resp 03/09/22 1308 (!) 22     Temp 03/09/22 1308 (!) 97.5 F (36.4 C)     Temp Source 03/09/22 1308 Oral     SpO2 03/09/22 1308 (!) 87 %     Weight 03/09/22 1312 170 lb (77.1 kg)     Height 03/09/22 1312 4\' 10"  (1.473 m)     Head Circumference --      Peak Flow --       Pain Score 03/09/22 1312 10     Pain Loc --      Pain Edu? --      Excl. in [place]? --     Most recent vital signs: Vitals:   03/09/22 1421 03/09/22 1456  BP: (!) 75/63 (!) 84/65  Pulse: 93 94  Resp: (!) 24 20  Temp:    SpO2: 95% 99%     General: Alert, oriented x 2, weak appearing. CV:  Good peripheral perfusion.  Resp:  Increased respiratory effort.  Diminished breath sounds bilaterally with no wheezes or rales. Abd:  Soft with no focal tenderness or peritoneal signs.  No distention.  Other:  Oropharynx clear.  No edema or pooled secretions.  Clear voice.  Diffuse patchy erythematous rash with some ring type lesions to bilateral extremities and torso.  No palm or sole involvement.  No lesions to mucosal surfaces.  Rash is fully blanching and nontender.  No petechiae or open/excoriated lesions.   ED Results / Procedures / Treatments   Labs (all labs ordered are listed, but only abnormal results are displayed) Labs Reviewed  CBC WITH DIFFERENTIAL/PLATELET - Abnormal; Notable for the following components:      Result  Value   WBC 29.2 (*)    Hemoglobin 16.0 (*)    HCT 48.7 (*)    RDW 16.8 (*)    Neutro Abs 25.2 (*)    Monocytes Absolute 2.0 (*)    Abs Immature Granulocytes 0.26 (*)    All other components within normal limits  COMPREHENSIVE METABOLIC PANEL - Abnormal; Notable for the following components:   CO2 18 (*)    Glucose, Bld 167 (*)    BUN 34 (*)    Creatinine, Ser 2.64 (*)    Total Protein 6.3 (*)    Albumin 3.0 (*)    AST 311 (*)    ALT 122 (*)    Total Bilirubin 1.4 (*)    GFR, Estimated 21 (*)    Anion gap 20 (*)    All other components within normal limits  LACTIC ACID, PLASMA - Abnormal; Notable for the following components:   Lactic Acid, Venous 5.4 (*)    All other components within normal limits  RESP PANEL BY RT-PCR (RSV, FLU A&B, COVID)  RVPGX2  CULTURE, BLOOD (ROUTINE X 2)  CULTURE, BLOOD (ROUTINE X 2)  BRAIN NATRIURETIC PEPTIDE  URINALYSIS,  ROUTINE W REFLEX MICROSCOPIC  LACTIC ACID, PLASMA  TROPONIN I (HIGH SENSITIVITY)     EKG  ED ECG REPORT I, Arta Silence, the attending physician, personally viewed and interpreted this ECG.  Date: 03/09/2022 EKG Time: 1422 Rate: 97 Rhythm: normal sinus rhythm QRS Axis: normal Intervals: normal ST/T Wave abnormalities: Nonspecific T wave abnormality Narrative Interpretation: no evidence of acute ischemia    RADIOLOGY  Chest x-ray: I independently viewed and interpreted the images; there are no focal consolidations but bilateral interstitial opacities  PROCEDURES:  Critical Care performed: Yes, see critical care procedure note(s)  .Critical Care  Performed by: Arta Silence, MD Authorized by: Arta Silence, MD   Critical care provider statement:    Critical care time (minutes):  45   Critical care time was exclusive of:  Separately billable procedures and treating other patients   Critical care was necessary to treat or prevent imminent or life-threatening deterioration of the following conditions:  Sepsis   Critical care was time spent personally by me on the following activities:  Development of treatment plan with patient or surrogate, discussions with consultants, evaluation of patient's response to treatment, examination of patient, ordering and review of laboratory studies, ordering and review of radiographic studies, ordering and performing treatments and interventions, pulse oximetry, re-evaluation of patient's condition, review of old charts and obtaining history from patient or surrogate   Care discussed with: admitting provider      MEDICATIONS ORDERED IN ED: Medications  ceFEPIme (MAXIPIME) 2 g in sodium chloride 0.9 % 100 mL IVPB (2 g Intravenous New Bag/Given 03/09/22 1448)  vancomycin (VANCOCIN) IVPB 1000 mg/200 mL premix (has no administration in time range)  sodium chloride 0.9 % bolus 500 mL (500 mLs Intravenous New Bag/Given 03/09/22  1506)  fentaNYL (SUBLIMAZE) injection 50 mcg (has no administration in time range)  sodium chloride 0.9 % bolus 1,000 mL (1,000 mLs Intravenous New Bag/Given 03/09/22 1413)  sodium chloride 0.9 % bolus 1,000 mL (1,000 mLs Intravenous New Bag/Given 03/09/22 1422)     IMPRESSION / MDM / [place] / ED COURSE  I reviewed the triage vital signs and the nursing notes.  56 year old female with PMH as noted above presents with diffuse rash, GI symptoms, and shortness of breath with hypoxia; all of these symptoms started this morning.  On exam the patient is alert but intermittently mildly confused, oriented x 2.  Neuro exam is nonfocal.  She is significantly hypoxic requiring 6 L O2 by nasal cannula and breath sounds are diminished bilaterally.  She has diffuse patchy rash not involving palms or soles and which is blanching and nontender.  The patient received IM epinephrine and Benadryl from EMS.  Differential diagnosis includes, but is not limited to, acute infection/sepsis, possible pneumonia, influenza, other viral syndrome, UTI, intra-abdominal infection, or bacteremia due to unknown etiology.  The patient has no fever or meningeal signs, and the rash is not consistent with meningitis or RMSF.  Overall the presentation is less consistent with allergic reaction given that she does not have raised hives and they are not pruritic.  The patient has not had any true anaphylactic type symptoms.  Will obtain chest x-ray, lab workup including sepsis labs and respiratory panel, and reassess.  Anticipate the patient will need admission.  [name] presentation is most consistent with acute presentation with potential threat to life or bodily function.  The patient is on the cardiac monitor to evaluate for evidence of arrhythmia and/or significant heart rate changes.  ----------------------------------------- 2:46 PM on 03/09/2022 -----------------------------------------  The patient is now  hypotensive.  WBC count is 29 and the lactate is 5.4.  I have ordered 30 mL/kg fluid bolus per the sepsis protocol and empiric antibiotics for unknown source.  ----------------------------------------- 3:16 PM on 03/09/2022 -----------------------------------------  Blood pressure starting to improve with fluids.  The patient is reporting more significant abdominal pains I ordered a CT for further evaluation.  We will include the chest as well to better elucidate the findings on the chest x-ray.  The patient will need admission once her workup is complete.  I have signed her out to the oncoming ED physician Dr. Cinda Quest.   FINAL CLINICAL IMPRESSION(S) / ED DIAGNOSES   Final diagnoses:  None     Rx / DC Orders   ED Discharge Orders     None        Note:  This document was prepared using Dragon voice recognition software and may include unintentional dictation errors.    Arta Silence, MD 03/09/22 1517

## 2022-03-09 NOTE — Progress Notes (Signed)
CODE SEPSIS - PHARMACY COMMUNICATION  **Broad Spectrum Antibiotics should be administered within 1 hour of Sepsis diagnosis**  Time Code Sepsis Called/Page Received: 1445  Antibiotics Ordered: vancomycin 1,000 mg x 1 and cefepime 2 grams x 1  Time of 1st antibiotic administration: 4268  Additional action taken by pharmacy: none  If necessary, Name of Provider/Nurse Contacted: n/a    Glean Salvo, PharmD, BCPS Clinical Pharmacist  03/09/2022 2:57 PM

## 2022-03-09 NOTE — ED Provider Notes (Signed)
-----------------------------------------   6:45 PM on 03/09/2022 ----------------------------------------- CT has returned showing the intestines full of fluid.  Shortly thereafter patient began having diarrhea.  We have sent C. difficile and stool pathology specimen for this.  I have also added Flagyl to it.  [name] blood pressure went down slightly and her MAP 1 below 60 so I have started on Levophed.  On Levophed at a very low dose of blood pressure is gone up to 106/84.  This is good news.  I will not take her off Levophed however.  I have sometime ago paged: And asked for ICU transfer I just now talk to the regular internal medicine doctor and they will now page ICU for me.   Nena Polio, MD 03/09/22 [phone]

## 2022-03-09 NOTE — Progress Notes (Incomplete)
Pharmacy Antibiotic Note  Darlene Barber is a 56 y.o. female admitted on 03/09/2022 with {Indications:3041527}.  Pharmacy has been consulted for *** dosing ***.  Plan: *** *** q***hr per indication & renal fxn. {Assessment:21075}  Pt given Vancomycin *** mg once. Vancomycin *** mg IV Q *** hrs. Goal AUC 400-550. Expected AUC: *** SCr used: ***, Vd used: ***, BMI: ***, TBW *** kg < IBW *** kg  Pharmacy will continue to follow and will adjust abx dosing whenever warranted.  Temp (24hrs), Avg:97.5 F (36.4 C), Min:97.5 F (36.4 C), Max:97.5 F (36.4 C)   Recent Labs  Lab 03/09/22 1350 03/09/22 1407 03/09/22 1752 03/09/22 1948  WBC 29.2*  --   --   --   CREATININE 2.64*  --   --  2.53*  LATICACIDVEN  --  5.4* 3.3*  --     Estimated Creatinine Clearance: 21.7 mL/min (A) (by C-G formula based on SCr of 2.53 mg/dL (H)).    Allergies  Allergen Reactions   Piper Other (See Comments)    Black pepper Feels like throat is closing, itchy Feels like throat is closing, itchy Feels like throat is closing, itchy   Tape Rash    Paper tape is ok to use.    Antimicrobials this admission: *** *** >> *** *** *** >> *** *** *** >> ***  Microbiology results: *** BCx: *** *** UCx: ***  *** ***Cx: *** *** Sputum: ***  *** MRSA PCR: ***  Thank you for allowing pharmacy to be a part of this patient's care.  Renda Rolls, PharmD, MBA 03/09/2022 10:40 PM

## 2022-03-09 NOTE — ED Provider Notes (Incomplete)
   [facility] Provider Note    Event Date/Time   First MD Initiated Contact with Patient 03/09/22 1334     (approximate)   History   Chief Complaint: Allergic Reaction   HPI  Darlene Barber is a 56 y.o. female  ***       Physical Exam   Triage Vital Signs: ED Triage Vitals  Enc Vitals Group     BP 03/09/22 1308 (!) 133/38     Pulse Rate 03/09/22 1308 (!) 58     Resp 03/09/22 1308 (!) 22     Temp 03/09/22 1308 (!) 97.5 F (36.4 C)     Temp Source 03/09/22 1308 Oral     SpO2 03/09/22 1308 (!) 87 %     Weight 03/09/22 1312 170 lb (77.1 kg)     Height 03/09/22 1312 4\' 10"  (1.473 m)     Head Circumference --      Peak Flow --      Pain Score 03/09/22 1312 10     Pain Loc --      Pain Edu? --      Excl. in [place]? --     Most recent vital signs: Vitals:   03/09/22 1308  BP: (!) 133/38  Pulse: (!) 58  Resp: (!) 22  Temp: (!) 97.5 F (36.4 C)  SpO2: (!) 87%    General: Awake, no distress. *** CV:  Good peripheral perfusion. *** Resp:  Normal effort. *** Abd:  No distention. *** Other:  ***   ED Results / Procedures / Treatments   Labs (all labs ordered are listed, but only abnormal results are displayed) Labs Reviewed  CBC WITH DIFFERENTIAL/PLATELET  COMPREHENSIVE METABOLIC PANEL     EKG ***   RADIOLOGY ***   PROCEDURES:  Procedures   MEDICATIONS ORDERED IN ED: Medications - No data to display   IMPRESSION / MDM / Victoria Vera / ED COURSE  I reviewed the triage vital signs and the nursing notes.                              Differential diagnosis includes, but is not limited to, ***  [name] presentation is most consistent with {EM COPA:27473}  ***       FINAL CLINICAL IMPRESSION(S) / ED DIAGNOSES   Final diagnoses:  None     Rx / DC Orders   ED Discharge Orders     None        Note:  This document was prepared using Dragon voice recognition software and may include  unintentional dictation errors.

## 2022-03-09 NOTE — ED Notes (Signed)
Report to Velva Harman, RN

## 2022-03-09 NOTE — Progress Notes (Signed)
This is a no charge note  I am called for admission for this patient.  Patient is a 56 year old lady with past medical history of hypertension, COPD, chronic pain, depression with anxiety, bipolar, tobacco abuse, presents with allergic reaction with rash, nausea, vomiting, diarrhea and abdominal pain.  Developed hypotension, with aggressive IV fluid resuscitation, still persistently hypotensive.  Patient is started on Levophed.  This patient needs to go to ICU.  Dr. Rip Harbour of ED cancelled admission to our service.      Ivor Costa, MD  [facility]   If 7PM-7AM, please contact night-coverage [email or web address] 03/09/2022, 6:04 PM

## 2022-03-09 NOTE — ED Notes (Signed)
Lactate 5.4. MD made aware.

## 2022-03-09 NOTE — Sepsis Progress Note (Signed)
Elink monitoring code sepsis 

## 2022-03-09 NOTE — ED Provider Notes (Signed)
-----------------------------------------   6:58 PM on 03/09/2022 ----------------------------------------- Patient has been accepted to [facility] by Dr. Lake Bells.  [facility] beds either.  Dr. Lake Bells will get the ICU here to help manage her which would be wonderful.   Nena Polio, MD 03/09/22 [phone]

## 2022-03-09 NOTE — Consult Note (Signed)
PHARMACY -  BRIEF ANTIBIOTIC NOTE   Pharmacy has received consult(s) for cefepime and vancomycin from an ED provider.  The patient's profile has been reviewed for ht/wt/allergies/indication/available labs.    One time order(s) placed for  --Cefepime 2 g IV --Vancomycin 1 g IV  Further antibiotics/pharmacy consults should be ordered by admitting physician if indicated.                       Thank you, Benita Gutter 03/09/2022  2:33 PM

## 2022-03-10 ENCOUNTER — Inpatient Hospital Stay (HOSPITAL_COMMUNITY)
Admit: 2022-03-10 | Discharge: 2022-04-23 | DRG: 870 | Disposition: A | Discharge status: Skilled Nursing Facility | Payer: Medicaid Other | Source: Other Acute Inpatient Hospital | Attending: Internal Medicine | Admitting: Internal Medicine

## 2022-03-10 ENCOUNTER — Inpatient Hospital Stay (HOSPITAL_COMMUNITY): Payer: Medicaid Other

## 2022-03-10 DIAGNOSIS — F1210 Cannabis abuse, uncomplicated: Secondary | ICD-10-CM | POA: Diagnosis present

## 2022-03-10 DIAGNOSIS — D62 Acute posthemorrhagic anemia: Secondary | ICD-10-CM | POA: Diagnosis not present

## 2022-03-10 DIAGNOSIS — J9600 Acute respiratory failure, unspecified whether with hypoxia or hypercapnia: Secondary | ICD-10-CM | POA: Diagnosis not present

## 2022-03-10 DIAGNOSIS — G4733 Obstructive sleep apnea (adult) (pediatric): Secondary | ICD-10-CM | POA: Diagnosis present

## 2022-03-10 DIAGNOSIS — E871 Hypo-osmolality and hyponatremia: Secondary | ICD-10-CM | POA: Diagnosis not present

## 2022-03-10 DIAGNOSIS — G9340 Encephalopathy, unspecified: Secondary | ICD-10-CM

## 2022-03-10 DIAGNOSIS — E874 Mixed disorder of acid-base balance: Secondary | ICD-10-CM | POA: Diagnosis present

## 2022-03-10 DIAGNOSIS — I130 Hypertensive heart and chronic kidney disease with heart failure and stage 1 through stage 4 chronic kidney disease, or unspecified chronic kidney disease: Secondary | ICD-10-CM | POA: Diagnosis present

## 2022-03-10 DIAGNOSIS — Z820 Family history of epilepsy and other diseases of the nervous system: Secondary | ICD-10-CM

## 2022-03-10 DIAGNOSIS — N170 Acute kidney failure with tubular necrosis: Secondary | ICD-10-CM | POA: Diagnosis present

## 2022-03-10 DIAGNOSIS — F05 Delirium due to known physiological condition: Secondary | ICD-10-CM | POA: Diagnosis not present

## 2022-03-10 DIAGNOSIS — K625 Hemorrhage of anus and rectum: Secondary | ICD-10-CM | POA: Diagnosis not present

## 2022-03-10 DIAGNOSIS — K922 Gastrointestinal hemorrhage, unspecified: Secondary | ICD-10-CM

## 2022-03-10 DIAGNOSIS — Z6832 Body mass index (BMI) 32.0-32.9, adult: Secondary | ICD-10-CM

## 2022-03-10 DIAGNOSIS — J189 Pneumonia, unspecified organism: Secondary | ICD-10-CM | POA: Diagnosis not present

## 2022-03-10 DIAGNOSIS — J95851 Ventilator associated pneumonia: Secondary | ICD-10-CM | POA: Diagnosis not present

## 2022-03-10 DIAGNOSIS — K559 Vascular disorder of intestine, unspecified: Secondary | ICD-10-CM | POA: Diagnosis present

## 2022-03-10 DIAGNOSIS — E669 Obesity, unspecified: Secondary | ICD-10-CM | POA: Diagnosis present

## 2022-03-10 DIAGNOSIS — G928 Other toxic encephalopathy: Secondary | ICD-10-CM | POA: Diagnosis not present

## 2022-03-10 DIAGNOSIS — F1910 Other psychoactive substance abuse, uncomplicated: Secondary | ICD-10-CM | POA: Diagnosis not present

## 2022-03-10 DIAGNOSIS — Z781 Physical restraint status: Secondary | ICD-10-CM

## 2022-03-10 DIAGNOSIS — R809 Proteinuria, unspecified: Secondary | ICD-10-CM | POA: Diagnosis not present

## 2022-03-10 DIAGNOSIS — E875 Hyperkalemia: Secondary | ICD-10-CM | POA: Diagnosis present

## 2022-03-10 DIAGNOSIS — M5412 Radiculopathy, cervical region: Principal | ICD-10-CM

## 2022-03-10 DIAGNOSIS — K219 Gastro-esophageal reflux disease without esophagitis: Secondary | ICD-10-CM | POA: Diagnosis present

## 2022-03-10 DIAGNOSIS — F17210 Nicotine dependence, cigarettes, uncomplicated: Secondary | ICD-10-CM | POA: Diagnosis present

## 2022-03-10 DIAGNOSIS — N179 Acute kidney failure, unspecified: Secondary | ICD-10-CM

## 2022-03-10 DIAGNOSIS — J9601 Acute respiratory failure with hypoxia: Secondary | ICD-10-CM | POA: Diagnosis not present

## 2022-03-10 DIAGNOSIS — K921 Melena: Secondary | ICD-10-CM

## 2022-03-10 DIAGNOSIS — K580 Irritable bowel syndrome with diarrhea: Secondary | ICD-10-CM | POA: Diagnosis present

## 2022-03-10 DIAGNOSIS — R7881 Bacteremia: Secondary | ICD-10-CM | POA: Diagnosis not present

## 2022-03-10 DIAGNOSIS — Y848 Other medical procedures as the cause of abnormal reaction of the patient, or of later complication, without mention of misadventure at the time of the procedure: Secondary | ICD-10-CM | POA: Diagnosis not present

## 2022-03-10 DIAGNOSIS — R54 Age-related physical debility: Secondary | ICD-10-CM | POA: Diagnosis present

## 2022-03-10 DIAGNOSIS — Y95 Nosocomial condition: Secondary | ICD-10-CM | POA: Diagnosis not present

## 2022-03-10 DIAGNOSIS — K7200 Acute and subacute hepatic failure without coma: Secondary | ICD-10-CM | POA: Diagnosis not present

## 2022-03-10 DIAGNOSIS — F319 Bipolar disorder, unspecified: Secondary | ICD-10-CM | POA: Diagnosis present

## 2022-03-10 DIAGNOSIS — J440 Chronic obstructive pulmonary disease with acute lower respiratory infection: Secondary | ICD-10-CM | POA: Diagnosis not present

## 2022-03-10 DIAGNOSIS — E1165 Type 2 diabetes mellitus with hyperglycemia: Secondary | ICD-10-CM | POA: Diagnosis present

## 2022-03-10 DIAGNOSIS — E869 Volume depletion, unspecified: Secondary | ICD-10-CM | POA: Diagnosis not present

## 2022-03-10 DIAGNOSIS — A409 Streptococcal sepsis, unspecified: Secondary | ICD-10-CM | POA: Diagnosis present

## 2022-03-10 DIAGNOSIS — R8281 Pyuria: Secondary | ICD-10-CM | POA: Diagnosis not present

## 2022-03-10 DIAGNOSIS — N1832 Chronic kidney disease, stage 3b: Secondary | ICD-10-CM | POA: Diagnosis present

## 2022-03-10 DIAGNOSIS — Z8249 Family history of ischemic heart disease and other diseases of the circulatory system: Secondary | ICD-10-CM

## 2022-03-10 DIAGNOSIS — F29 Unspecified psychosis not due to a substance or known physiological condition: Secondary | ICD-10-CM | POA: Diagnosis not present

## 2022-03-10 DIAGNOSIS — I10 Essential (primary) hypertension: Secondary | ICD-10-CM | POA: Diagnosis not present

## 2022-03-10 DIAGNOSIS — N200 Calculus of kidney: Secondary | ICD-10-CM | POA: Diagnosis present

## 2022-03-10 DIAGNOSIS — R21 Rash and other nonspecific skin eruption: Secondary | ICD-10-CM | POA: Diagnosis not present

## 2022-03-10 DIAGNOSIS — R441 Visual hallucinations: Secondary | ICD-10-CM | POA: Diagnosis not present

## 2022-03-10 DIAGNOSIS — K581 Irritable bowel syndrome with constipation: Secondary | ICD-10-CM | POA: Diagnosis present

## 2022-03-10 DIAGNOSIS — I5032 Chronic diastolic (congestive) heart failure: Secondary | ICD-10-CM | POA: Diagnosis present

## 2022-03-10 DIAGNOSIS — R34 Anuria and oliguria: Secondary | ICD-10-CM | POA: Diagnosis not present

## 2022-03-10 DIAGNOSIS — Z1152 Encounter for screening for COVID-19: Secondary | ICD-10-CM | POA: Diagnosis not present

## 2022-03-10 DIAGNOSIS — Z807 Family history of other malignant neoplasms of lymphoid, hematopoietic and related tissues: Secondary | ICD-10-CM

## 2022-03-10 DIAGNOSIS — E872 Acidosis, unspecified: Secondary | ICD-10-CM | POA: Diagnosis not present

## 2022-03-10 DIAGNOSIS — D649 Anemia, unspecified: Secondary | ICD-10-CM | POA: Diagnosis not present

## 2022-03-10 DIAGNOSIS — R339 Retention of urine, unspecified: Secondary | ICD-10-CM | POA: Diagnosis present

## 2022-03-10 DIAGNOSIS — D500 Iron deficiency anemia secondary to blood loss (chronic): Secondary | ICD-10-CM | POA: Diagnosis not present

## 2022-03-10 DIAGNOSIS — R110 Nausea: Secondary | ICD-10-CM | POA: Diagnosis present

## 2022-03-10 DIAGNOSIS — Z791 Long term (current) use of non-steroidal anti-inflammatories (NSAID): Secondary | ICD-10-CM

## 2022-03-10 DIAGNOSIS — Z9109 Other allergy status, other than to drugs and biological substances: Secondary | ICD-10-CM

## 2022-03-10 DIAGNOSIS — Z833 Family history of diabetes mellitus: Secondary | ICD-10-CM

## 2022-03-10 DIAGNOSIS — D696 Thrombocytopenia, unspecified: Secondary | ICD-10-CM | POA: Diagnosis present

## 2022-03-10 DIAGNOSIS — G894 Chronic pain syndrome: Secondary | ICD-10-CM | POA: Diagnosis present

## 2022-03-10 DIAGNOSIS — K529 Noninfective gastroenteritis and colitis, unspecified: Secondary | ICD-10-CM | POA: Diagnosis not present

## 2022-03-10 DIAGNOSIS — K633 Ulcer of intestine: Secondary | ICD-10-CM

## 2022-03-10 DIAGNOSIS — Z992 Dependence on renal dialysis: Secondary | ICD-10-CM | POA: Diagnosis not present

## 2022-03-10 DIAGNOSIS — F1590 Other stimulant use, unspecified, uncomplicated: Secondary | ICD-10-CM | POA: Diagnosis present

## 2022-03-10 DIAGNOSIS — R6521 Severe sepsis with septic shock: Secondary | ICD-10-CM | POA: Diagnosis present

## 2022-03-10 DIAGNOSIS — F419 Anxiety disorder, unspecified: Secondary | ICD-10-CM | POA: Diagnosis present

## 2022-03-10 DIAGNOSIS — A419 Sepsis, unspecified organism: Secondary | ICD-10-CM

## 2022-03-10 DIAGNOSIS — Z91048 Other nonmedicinal substance allergy status: Secondary | ICD-10-CM

## 2022-03-10 DIAGNOSIS — Z7982 Long term (current) use of aspirin: Secondary | ICD-10-CM

## 2022-03-10 DIAGNOSIS — E1122 Type 2 diabetes mellitus with diabetic chronic kidney disease: Secondary | ICD-10-CM | POA: Diagnosis present

## 2022-03-10 DIAGNOSIS — Z79899 Other long term (current) drug therapy: Secondary | ICD-10-CM

## 2022-03-10 DIAGNOSIS — Z825 Family history of asthma and other chronic lower respiratory diseases: Secondary | ICD-10-CM

## 2022-03-10 DIAGNOSIS — A09 Infectious gastroenteritis and colitis, unspecified: Secondary | ICD-10-CM | POA: Diagnosis present

## 2022-03-10 DIAGNOSIS — T508X5A Adverse effect of diagnostic agents, initial encounter: Secondary | ICD-10-CM | POA: Diagnosis present

## 2022-03-10 DIAGNOSIS — R451 Restlessness and agitation: Secondary | ICD-10-CM | POA: Diagnosis present

## 2022-03-10 DIAGNOSIS — I480 Paroxysmal atrial fibrillation: Secondary | ICD-10-CM | POA: Diagnosis present

## 2022-03-10 DIAGNOSIS — Z7951 Long term (current) use of inhaled steroids: Secondary | ICD-10-CM

## 2022-03-10 DIAGNOSIS — E8770 Fluid overload, unspecified: Secondary | ICD-10-CM | POA: Diagnosis not present

## 2022-03-10 DIAGNOSIS — L509 Urticaria, unspecified: Secondary | ICD-10-CM | POA: Diagnosis not present

## 2022-03-10 LAB — BLOOD CULTURE ID PANEL (REFLEXED) - BCID2
A.calcoaceticus-baumannii: NOT DETECTED
Bacteroides fragilis: NOT DETECTED
Candida albicans: NOT DETECTED
Candida auris: NOT DETECTED
Candida glabrata: NOT DETECTED
Candida krusei: NOT DETECTED
Candida parapsilosis: NOT DETECTED
Candida tropicalis: NOT DETECTED
Cryptococcus neoformans/gattii: NOT DETECTED
Enterobacter cloacae complex: NOT DETECTED
Enterobacterales: NOT DETECTED
Enterococcus Faecium: NOT DETECTED
Enterococcus faecalis: NOT DETECTED
Escherichia coli: NOT DETECTED
Haemophilus influenzae: NOT DETECTED
Klebsiella aerogenes: NOT DETECTED
Klebsiella oxytoca: NOT DETECTED
Klebsiella pneumoniae: NOT DETECTED
Listeria monocytogenes: NOT DETECTED
Neisseria meningitidis: NOT DETECTED
Proteus species: NOT DETECTED
Pseudomonas aeruginosa: NOT DETECTED
Salmonella species: NOT DETECTED
Serratia marcescens: NOT DETECTED
Staphylococcus species: NOT DETECTED
Stenotrophomonas maltophilia: NOT DETECTED
Streptococcus agalactiae: NOT DETECTED
Streptococcus pneumoniae: NOT DETECTED
Streptococcus pyogenes: NOT DETECTED
Streptococcus species: DETECTED — AB

## 2022-03-10 LAB — GLUCOSE, CAPILLARY
Glucose-Capillary: 101 mg/dL — ABNORMAL HIGH (ref 70–99)
Glucose-Capillary: 224 mg/dL — ABNORMAL HIGH (ref 70–99)
Glucose-Capillary: 95 mg/dL (ref 70–99)

## 2022-03-10 LAB — BLOOD GAS, ARTERIAL
Acid-base deficit: 9.2 mmol/L — ABNORMAL HIGH (ref 0.0–2.0)
Bicarbonate: 18 mmol/L — ABNORMAL LOW (ref 20.0–28.0)
O2 Content: 15 L/min
O2 Saturation: 95.6 %
Patient temperature: 36.7
pCO2 arterial: 42 mmHg (ref 32–48)
pH, Arterial: 7.23 — ABNORMAL LOW (ref 7.35–7.45)
pO2, Arterial: 99 mmHg (ref 83–108)

## 2022-03-10 LAB — BASIC METABOLIC PANEL
Anion gap: 11 (ref 5–15)
Anion gap: 11 (ref 5–15)
Anion gap: 12 (ref 5–15)
BUN: 48 mg/dL — ABNORMAL HIGH (ref 6–20)
BUN: 55 mg/dL — ABNORMAL HIGH (ref 6–20)
BUN: 61 mg/dL — ABNORMAL HIGH (ref 6–20)
CO2: 17 mmol/L — ABNORMAL LOW (ref 22–32)
CO2: 16 mmol/L — ABNORMAL LOW (ref 22–32)
CO2: 15 mmol/L — ABNORMAL LOW (ref 22–32)
Calcium: 8 mg/dL — ABNORMAL LOW (ref 8.9–10.3)
Calcium: 7.1 mg/dL — ABNORMAL LOW (ref 8.9–10.3)
Calcium: 7.4 mg/dL — ABNORMAL LOW (ref 8.9–10.3)
Chloride: 106 mmol/L (ref 98–111)
Chloride: 109 mmol/L (ref 98–111)
Chloride: 108 mmol/L (ref 98–111)
Creatinine, Ser: 3.24 mg/dL — ABNORMAL HIGH (ref 0.44–1.00)
Creatinine, Ser: 3.48 mg/dL — ABNORMAL HIGH (ref 0.44–1.00)
Creatinine, Ser: 3.74 mg/dL — ABNORMAL HIGH (ref 0.44–1.00)
GFR, Estimated: 16 mL/min — ABNORMAL LOW (ref >60)
GFR, Estimated: 15 mL/min — ABNORMAL LOW (ref >60)
GFR, Estimated: 14 mL/min — ABNORMAL LOW (ref >60)
Glucose, Bld: 150 mg/dL — ABNORMAL HIGH (ref 70–99)
Glucose, Bld: 176 mg/dL — ABNORMAL HIGH (ref 70–99)
Glucose, Bld: 132 mg/dL — ABNORMAL HIGH (ref 70–99)
Potassium: 6.5 mmol/L (ref 3.5–5.1)
Potassium: 5.5 mmol/L — ABNORMAL HIGH (ref 3.5–5.1)
Potassium: 5.5 mmol/L — ABNORMAL HIGH (ref 3.5–5.1)
Sodium: 134 mmol/L — ABNORMAL LOW (ref 135–145)
Sodium: 136 mmol/L (ref 135–145)
Sodium: 135 mmol/L (ref 135–145)

## 2022-03-10 LAB — CBC
HCT: 44.3 % (ref 36.0–46.0)
Hemoglobin: 14.6 g/dL (ref 12.0–15.0)
MCH: 32.2 pg (ref 26.0–34.0)
MCHC: 33 g/dL (ref 30.0–36.0)
MCV: 97.8 fL (ref 80.0–100.0)
Platelets: 275 10*3/uL (ref 150–400)
RBC: 4.53 MIL/uL (ref 3.87–5.11)
RDW: 16.6 % — ABNORMAL HIGH (ref 11.5–15.5)
WBC: 31.8 10*3/uL — ABNORMAL HIGH (ref 4.0–10.5)
nRBC: 0 % (ref 0.0–0.2)

## 2022-03-10 LAB — MAGNESIUM: Magnesium: 2.5 mg/dL — ABNORMAL HIGH (ref 1.7–2.4)

## 2022-03-10 LAB — POTASSIUM
Potassium: 5.5 mmol/L — ABNORMAL HIGH (ref 3.5–5.1)
Potassium: 5.9 mmol/L — ABNORMAL HIGH (ref 3.5–5.1)
Potassium: 6.3 mmol/L (ref 3.5–5.1)

## 2022-03-10 LAB — BRAIN NATRIURETIC PEPTIDE: B Natriuretic Peptide: 203.6 pg/mL — ABNORMAL HIGH (ref 0.0–100.0)

## 2022-03-10 LAB — CK: Total CK: 144 U/L (ref 38–234)

## 2022-03-10 LAB — HIV ANTIBODY (ROUTINE TESTING W REFLEX): HIV Screen 4th Generation wRfx: NONREACTIVE

## 2022-03-10 LAB — PHOSPHORUS: Phosphorus: 3.7 mg/dL (ref 2.5–4.6)

## 2022-03-10 LAB — C-REACTIVE PROTEIN: CRP: 22.3 mg/dL — ABNORMAL HIGH (ref <1.0)

## 2022-03-10 MED ORDER — DEXTROSE 50 % IV SOLN
1.0000 | Freq: Once | INTRAVENOUS | Status: AC
  Administered 2022-03-10: 50 mL via INTRAVENOUS
  Filled 2022-03-10: qty 50

## 2022-03-10 MED ORDER — METHYLPREDNISOLONE SODIUM SUCC 40 MG IJ SOLR
40.0000 mg | Freq: Two times a day (BID) | INTRAMUSCULAR | Status: DC
  Administered 2022-03-10 – 2022-03-11 (×3): 40 mg via INTRAVENOUS
  Filled 2022-03-10 (×3): qty 1

## 2022-03-10 MED ORDER — SODIUM CHLORIDE 0.9 % IV BOLUS: 250.0000 mL | Freq: Once | INTRAVENOUS | Status: DC

## 2022-03-10 MED ORDER — INSULIN ASPART 100 UNIT/ML IV SOLN
5.0000 [IU] | Freq: Once | INTRAVENOUS | Status: AC
  Administered 2022-03-10: 5 [IU] via INTRAVENOUS

## 2022-03-10 MED ORDER — LACTATED RINGERS IV BOLUS
1000.0000 mL | Freq: Once | INTRAVENOUS | Status: AC
  Administered 2022-03-10: 1000 mL via INTRAVENOUS

## 2022-03-10 MED ORDER — ALBUTEROL SULFATE (2.5 MG/3ML) 0.083% IN NEBU
10.0000 mg | INHALATION_SOLUTION | Freq: Once | RESPIRATORY_TRACT | Status: AC
  Administered 2022-03-10: 10 mg via RESPIRATORY_TRACT
  Filled 2022-03-10: qty 12

## 2022-03-10 MED ORDER — FAMOTIDINE IN NACL 20-0.9 MG/50ML-% IV SOLN
20.0000 mg | INTRAVENOUS | Status: DC
  Administered 2022-03-10 – 2022-03-12 (×3): 20 mg via INTRAVENOUS
  Filled 2022-03-10 (×3): qty 50

## 2022-03-10 MED ORDER — VANCOMYCIN HCL 1500 MG/300ML IV SOLN
1500.0000 mg | Freq: Once | INTRAVENOUS | Status: AC
  Administered 2022-03-10: 1500 mg via INTRAVENOUS
  Filled 2022-03-10: qty 300

## 2022-03-10 MED ORDER — SODIUM CHLORIDE 0.9 % IV SOLN
2.0000 g | INTRAVENOUS | Status: DC
  Administered 2022-03-10: 2 g via INTRAVENOUS
  Filled 2022-03-10: qty 12.5

## 2022-03-10 MED ORDER — DEXMEDETOMIDINE HCL IN NACL 200 MCG/50ML IV SOLN
0.0000 ug/kg/h | INTRAVENOUS | Status: DC
  Administered 2022-03-10: 0.6 ug/kg/h via INTRAVENOUS
  Administered 2022-03-10: 0.4 ug/kg/h via INTRAVENOUS
  Administered 2022-03-10: 0.9 ug/kg/h via INTRAVENOUS
  Administered 2022-03-10: 0.5 ug/kg/h via INTRAVENOUS
  Administered 2022-03-11 (×3): 0.9 ug/kg/h via INTRAVENOUS
  Filled 2022-03-10 (×4): qty 50
  Filled 2022-03-10: qty 100
  Filled 2022-03-10: qty 50

## 2022-03-10 MED ORDER — DOCUSATE SODIUM 100 MG PO CAPS: 100.0000 mg | ORAL_CAPSULE | Freq: Two times a day (BID) | ORAL | Status: DC | PRN

## 2022-03-10 MED ORDER — CHLORHEXIDINE GLUCONATE CLOTH 2 % EX PADS
6.0000 | MEDICATED_PAD | Freq: Every day | CUTANEOUS | Status: DC
  Administered 2022-03-10 – 2022-04-22 (×42): 6 via TOPICAL

## 2022-03-10 MED ORDER — ONDANSETRON HCL 4 MG/2ML IJ SOLN
4.0000 mg | Freq: Four times a day (QID) | INTRAMUSCULAR | Status: DC | PRN
  Administered 2022-03-21 – 2022-04-22 (×24): 4 mg via INTRAVENOUS
  Filled 2022-03-10 (×26): qty 2

## 2022-03-10 MED ORDER — ALBUTEROL SULFATE (2.5 MG/3ML) 0.083% IN NEBU: 2.5000 mg | INHALATION_SOLUTION | RESPIRATORY_TRACT | Status: DC | PRN

## 2022-03-10 MED ORDER — POLYETHYLENE GLYCOL 3350 17 G PO PACK: 17.0000 g | PACK | Freq: Every day | ORAL | Status: DC | PRN

## 2022-03-10 MED ORDER — HEPARIN SODIUM (PORCINE) 5000 UNIT/ML IJ SOLN
5000.0000 [IU] | Freq: Three times a day (TID) | INTRAMUSCULAR | Status: DC
  Administered 2022-03-10 – 2022-03-13 (×11): 5000 [IU] via SUBCUTANEOUS
  Filled 2022-03-10 (×10): qty 1

## 2022-03-10 MED ORDER — SODIUM CHLORIDE 0.9 % IV SOLN: INTRAVENOUS | Status: AC

## 2022-03-10 MED ORDER — SODIUM CHLORIDE 0.9 % IV SOLN
250.0000 mL | INTRAVENOUS | Status: DC
  Administered 2022-03-11 – 2022-04-02 (×3): 250 mL via INTRAVENOUS

## 2022-03-10 MED ORDER — REVEFENACIN 175 MCG/3ML IN SOLN
175.0000 ug | Freq: Every day | RESPIRATORY_TRACT | Status: DC
  Administered 2022-03-10 – 2022-04-01 (×23): 175 ug via RESPIRATORY_TRACT
  Filled 2022-03-10 (×28): qty 3

## 2022-03-10 MED ORDER — ALBUMIN HUMAN 25 % IV SOLN
25.0000 g | Freq: Four times a day (QID) | INTRAVENOUS | Status: AC
  Administered 2022-03-10 (×3): 25 g via INTRAVENOUS
  Filled 2022-03-10 (×3): qty 100

## 2022-03-10 MED ORDER — BUDESONIDE 0.5 MG/2ML IN SUSP
0.5000 mg | Freq: Two times a day (BID) | RESPIRATORY_TRACT | Status: DC
  Administered 2022-03-10 – 2022-04-03 (×47): 0.5 mg via RESPIRATORY_TRACT
  Filled 2022-03-10 (×53): qty 2

## 2022-03-10 MED ORDER — SODIUM BICARBONATE 8.4 % IV SOLN
50.0000 meq | Freq: Once | INTRAVENOUS | Status: AC
  Administered 2022-03-10: 50 meq via INTRAVENOUS
  Filled 2022-03-10: qty 50

## 2022-03-10 MED ORDER — ARFORMOTEROL TARTRATE 15 MCG/2ML IN NEBU
15.0000 ug | INHALATION_SOLUTION | Freq: Two times a day (BID) | RESPIRATORY_TRACT | Status: DC
  Administered 2022-03-10 – 2022-04-03 (×46): 15 ug via RESPIRATORY_TRACT
  Filled 2022-03-10 (×54): qty 2

## 2022-03-10 MED ORDER — PANTOPRAZOLE SODIUM 40 MG IV SOLR
40.0000 mg | INTRAVENOUS | Status: DC
  Administered 2022-03-10: 40 mg via INTRAVENOUS
  Filled 2022-03-10: qty 10

## 2022-03-10 MED ORDER — NOREPINEPHRINE 4 MG/250ML-% IV SOLN
0.0000 ug/min | INTRAVENOUS | Status: DC
  Administered 2022-03-10 (×2): 2 ug/min via INTRAVENOUS
  Filled 2022-03-10 (×2): qty 250

## 2022-03-10 MED ORDER — VANCOMYCIN VARIABLE DOSE PER UNSTABLE RENAL FUNCTION (PHARMACIST DOSING): Status: DC

## 2022-03-10 MED ORDER — METRONIDAZOLE 500 MG/100ML IV SOLN
500.0000 mg | Freq: Two times a day (BID) | INTRAVENOUS | Status: DC
  Administered 2022-03-10: 500 mg via INTRAVENOUS
  Filled 2022-03-10: qty 100

## 2022-03-10 MED ORDER — SODIUM ZIRCONIUM CYCLOSILICATE 10 G PO PACK
10.0000 g | PACK | Freq: Two times a day (BID) | ORAL | Status: DC
  Administered 2022-03-10 – 2022-03-12 (×5): 10 g via ORAL
  Filled 2022-03-10 (×5): qty 1

## 2022-03-10 MED ORDER — LORAZEPAM 2 MG/ML IJ SOLN
0.5000 mg | Freq: Four times a day (QID) | INTRAMUSCULAR | Status: DC | PRN
  Administered 2022-03-10 (×2): 1 mg via INTRAVENOUS
  Administered 2022-03-11 (×2): 0.5 mg via INTRAVENOUS
  Administered 2022-03-11: 1 mg via INTRAVENOUS
  Filled 2022-03-10 (×4): qty 1

## 2022-03-10 MED ORDER — SODIUM CHLORIDE 0.9 % IV SOLN
2.0000 g | INTRAVENOUS | Status: DC
  Administered 2022-03-10 – 2022-03-11 (×2): 2 g via INTRAVENOUS
  Filled 2022-03-10 (×2): qty 20

## 2022-03-10 MED ORDER — NALOXONE HCL 0.4 MG/ML IJ SOLN
0.4000 mg | Freq: Once | INTRAMUSCULAR | Status: AC
  Administered 2022-03-10: 0.4 mg via INTRAVENOUS
  Filled 2022-03-10: qty 1

## 2022-03-10 NOTE — Progress Notes (Signed)
[place] Progress Note Patient Name: Darlene Barber DOB: 1965-10-23 MRN: [id number]   Date of Service  03/10/2022  HPI/Events of Note  K+ 6.5  eICU Interventions  Hyperkalemia treatment protocol ordered, CK to r/o rhabdomyolysis, Gentle hydration.     Intervention Category Major Interventions: Electrolyte abnormality - evaluation and management  Frederik Pear 03/10/2022, 4:48 AM

## 2022-03-10 NOTE — Progress Notes (Signed)
[place] Progress Note Patient Name: ANISA LEANOS DOB: [birth year] MRN: [id number]   Date of Service  03/10/2022  HPI/Events of Note  Notified of K 6.3 Already on [place] Interventions  Hyperkalemia protocol ordered, D50/insulin 5, monitor glucose Will give bicarb push as well given acidosis Elink to be informed of repeat K      Intervention Category Intermediate Interventions: Electrolyte abnormality - evaluation and management  Judd Lien 03/10/2022, 10:23 PM

## 2022-03-10 NOTE — Progress Notes (Signed)
[place] Progress Note Patient Name: Darlene Barber DOB: [birth year] MRN: [id number]   Date of Service  03/10/2022  HPI/Events of Note  Received request for renewal of restraints Patient seen, appears confused and a risk for self harm by pulling lines and tubes  eICU Interventions  Bilateral soft wrist restraints renewed Bedside team to assess in am if restraints to be continued     Intervention Category Intermediate Interventions: Other:;Hyperglycemia - evaluation and treatment  Shona Needles Edy Mcbane 03/10/2022, 7:59 PM

## 2022-03-10 NOTE — Progress Notes (Signed)
[place] Progress Note Patient Name: Darlene Barber DOB: 03-10-1966 MRN: [id number]   Date of Service  03/10/2022  HPI/Events of Note  Patient somnolent but did rouse a bit with vigorous shaking, she received several narcotic doses and 2 mg of Versed earlier in the evening, likely accounting for her obtunded state.  eICU Interventions  Narcan 0.4 mg iv stat, ABG. Will consider Benzo reversal as well if somnolence does not improve with Narcan.        Kerry Kass Darlene Barber 03/10/2022, 1:13 AM

## 2022-03-10 NOTE — Progress Notes (Signed)
Pharmacy Antibiotic Note  Darlene Barber is a 56 y.o. female admitted on 03/10/2022 with rash and sob.  Medical history includes HTN, T2DM, OSA, urge incontinence, bipolar depression .  Pharmacy has been consulted to dose cefepime for sepsis.  Plan: Cefepime 2gm IV q24h Follow renal function and clinical course  Weight: 80.6 kg (177 lb 11.1 oz)  Temp (24hrs), Avg:97.9 F (36.6 C), Min:97.5 F (36.4 C), Max:98.6 F (37 C)  Recent Labs  Lab 03/09/22 1350 03/09/22 1407 03/09/22 1752 03/09/22 1948 03/09/22 2204 03/10/22 0049  WBC 29.2*  --   --   --   --   --   CREATININE 2.64*  --   --  2.53*  --   --   LATICACIDVEN  --  5.4* 3.3*  --  1.8 1.8    Estimated Creatinine Clearance: 22.3 mL/min (A) (by C-G formula based on SCr of 2.53 mg/dL (H)).    Allergies  Allergen Reactions   Piper Other (See Comments)    Black pepper Feels like throat is closing, itchy Feels like throat is closing, itchy Feels like throat is closing, itchy   Tape Rash    Paper tape is ok to use.     Thank you for allowing pharmacy to be a part of this patient's care.  Dolly Rias RPh 03/10/2022, 3:06 AM

## 2022-03-10 NOTE — Procedures (Addendum)
Central Venous Catheter Insertion Procedure Note  Darlene Barber  [id number]  1965/09/18  Date:03/10/22  Time:12:19 AM   Provider Performing:Nikkia Devoss A Coleston Dirosa   Procedure: Insertion of Non-tunneled Central Venous [phone]) with US guidance (81103)   Indication(s) Medication administration and Difficult access  Consent Risks of the procedure as well as the alternatives and risks of each were explained to the patient and/or caregiver.  Consent for the procedure was obtained and is signed in the bedside chart  Anesthesia Topical only with 1% lidocaine   Timeout Verified patient identification, verified procedure, site/side was marked, verified correct patient position, special equipment/implants available, medications/allergies/relevant history reviewed, required imaging and test results available.  Sterile Technique Maximal sterile technique including full sterile barrier drape, hand hygiene, sterile gown, sterile gloves, mask, hair covering, sterile ultrasound probe cover (if used).  Procedure Description Area of catheter insertion was cleaned with chlorhexidine and draped in sterile fashion.  With real-time ultrasound guidance a central venous catheter was placed into the left internal jugular vein. Nonpulsatile blood flow and easy flushing noted in all ports.  The catheter was sutured in place and sterile dressing applied.  Complications/Tolerance None; patient tolerated the procedure well. Chest X-ray is ordered to verify placement for internal jugular or subclavian cannulation.   Chest x-ray is not ordered for femoral cannulation.  EBL Minimal  Specimen(s) None   [name], DNP, CCRN, FNP-C, AGACNP-BC Acute Care & Family Nurse Practitioner  [facility]  See Amion for personal pager PCCM on call pager [phone] until 7 am

## 2022-03-10 NOTE — Progress Notes (Signed)
[place] Progress Note Patient Name: Darlene Barber DOB: 1965/12/22 MRN: [id number]   Date of Service  03/10/2022  HPI/Events of Note  Patient has blood cultures positive for Streptococcus.  eICU Interventions  Will add Vancomycin coverage pending sensitivity results.        Kerry Kass [name] 03/10/2022, 5:47 AM

## 2022-03-10 NOTE — Progress Notes (Signed)
NAME:  Darlene Barber, MRN:  [id number], DOB:  [birth year], LOS: 0 ADMISSION DATE:  03/10/2022, CONSULTATION DATE:  03/09/2022 REFERRING MD:  Conni Slipper  CHIEF COMPLAINT:  Abdominal Pain   HPI   56  y.o with significant PMH of chronic pain syndrome, cervical spondylitis, GERD, Bipolar Disorder, current everyday smoker and Marijuana use  who presented to the ED with chief complaints of abdominal pain and widespread generalized rash.  Per patient's significant other who is currently at the bedside, symptoms developed a day after patient ate at a local restaurant in [place]. [name] significant other report that patient was up all night with crampy abdominal pain associated with nausea and vomiting. Today at around 2 pm, they noticed a widespread rash all her her body including her chin area with now worsening pain prompting him to call EMS.    ED Course: Initial vital signs showed HR of 58 beats/minute, BP mm 133/38 Hg, the RR 22 , and the oxygen saturation 87% on 2 L and a temperature of 97.63F (36.4C).  Pertinent labs revealed elevated white count  WBC 29.2, electrolytes (sodium 140 mmol/L, potassium 4.7?mmol/L, there was acute kidney injury (urea 13.4?mmol/L, BUN/creatinine 34./2.64, eGFR 21, anion gap metabolic acidosis, and liver function tests showed a mixed hepatitic and cholestatic picture (Bilirubin 1.4, AST 311?U/L, ALT 122 U/L, ALP 75 U/L, and albumin 3.0). . Stool samples were sent as work up of acute diarrhoea and campylobacter species was isolated. The stool samples were negative for Giardia, Cryptosporidium, Salmonella, Shigella and E. coli. Clostridium difficile toxin was negative. Patient given 30 cc/kg of fluids and started on broad-spectrum antibiotics Vanco cefepime and Flagyl for  suspected sepsis of intraabdominal source.  Patient remained hypotensive despite IVF boluses therefore was started on Levophed.  PCCM consulted to assist with management pending transfer to [facility] as  there were no beds at [facility].  Past Medical History  chronic pain syndrome, cervical spondylitis, GERD, Bipolar Disorder, current everyday smoker and Marijuana use  Significant Hospital Events   12/30: admit to [facility] with acute diarrhea, PCCM consulted for management pending transfer  Consults:  GI  Procedures:  12/30: Left IJ Central Line  Significant Diagnostic Tests:  12/30: Chest Xray>Cardiomegaly. Increased interstitial markings are seen in parahilar regions and lower lung fields suggesting possible mild interstitial edema. There are linear densities in left lower lung fields suggesting subsegmental atelectasis. Possible minimal bilateral pleural effusions. 12/30: CT Chest abdomen and pelvis>1. Fluid throughout the stomach, small bowel and colon, nonspecific but may represent a diarrheal state. No bowel obstruction, definite bowel wall thickening or pneumoperitoneum. 2. Fluid within the esophagus which may represent reflux or nonspecific esophageal motility disorder. 3. Trace amount of free pelvic fluid, nonspecific. 4. Punctate nonobstructing bilateral renal calculi. 5.  Aortic Atherosclerosis (ICD10-I70.0).  Micro Data:  12/30: SARS-CoV-2 PCR> negative 12/30: Influenza PCR> negative 12/30: Blood culture x2> 12/30: Urine Culture> 12/30: MRSA PCR>>  12/31 BCX + Strep   Antimicrobials:  Vancomycin 12/30 x 1 Cefepime 12/30 x 1 Metronidazole 12/30 x 1 Zosyn 12/30>  OBJECTIVE  Blood pressure 107/65, pulse (!) 105, temperature 98 F (36.7 C), temperature source Oral, resp. rate 19, weight 80.6 kg, SpO2 94 %.       No intake or output data in the 24 hours ending 03/10/22 0724  Filed Weights   03/10/22 0242  Weight: 80.6 kg   Physical Examination   GENERAL: 56 year old female, chronically ill-appearing, critically ill lying in bed EYES: Pupils reactive, opens eyes tracks  HEENT: [place] AT LUNGS: Clear to auscultation bilaterally no crackles no wheeze CARDIOVASCULAR:  Regular rate rhythm S1-S2 ABDOMEN: Multiple tenderness to palpation, no distention EXTREMITIES: Moves all 4 extremities no significant edema pulses palpable distally. NEUROLOGIC: Alert, follows commands, still confused PSYCHIATRIC: Appropriate mood and affect.  SKIN: Raised lesions on the chest abdomen and legs, consistent with hives             Labs/imaging that I havepersonally reviewed  (right click and "Reselect all SmartList Selections" daily)     Labs   CBC: Recent Labs  Lab 03/09/22 1350 03/10/22 0308  WBC 29.2* 31.8*  NEUTROABS 25.2*  --   HGB 16.0* 14.6  HCT 48.7* 44.3  MCV 98.8 97.8  PLT 303 300    Basic Metabolic Panel: Recent Labs  Lab 03/09/22 1350 03/09/22 1948 03/10/22 0308  NA 140 136 134*  K 4.7 4.6 6.5*  CL 102 108 106  CO2 18* 16* 17*  GLUCOSE 167* 142* 150*  BUN 34* 37* 48*  CREATININE 2.64* 2.53* 3.24*  CALCIUM 9.9 7.7* 8.0*  MG  --   --  2.5*  PHOS  --   --  3.7   GFR: Estimated Creatinine Clearance: 17.4 mL/min (A) (by C-G formula based on SCr of 3.24 mg/dL (H)). Recent Labs  Lab 03/09/22 1350 03/09/22 1407 03/09/22 1752 03/09/22 2204 03/10/22 0049 03/10/22 0308  PROCALCITON  --   --   --  91.61  --   --   WBC 29.2*  --   --   --   --  31.8*  LATICACIDVEN  --  5.4* 3.3* 1.8 1.8  --     Liver Function Tests: Recent Labs  Lab 03/09/22 1350  AST 311*  ALT 122*  ALKPHOS 75  BILITOT 1.4*  PROT 6.3*  ALBUMIN 3.0*   Recent Labs  Lab 03/09/22 2204  LIPASE 88*   No results for input(s): "AMMONIA" in the last 168 hours.  ABG    Component Value Date/Time   PHART 7.23 (L) 03/10/2022 0116   PCO2ART 42 03/10/2022 0116   PO2ART 99 03/10/2022 0116   HCO3 18.0 (L) 03/10/2022 0116   ACIDBASEDEF 9.2 (H) 03/10/2022 0116   O2SAT 95.6 03/10/2022 0116     Coagulation Profile: No results for input(s): "INR", "PROTIME" in the last 168 hours.  Cardiac Enzymes: No results for input(s): "CKTOTAL", "CKMB", "CKMBINDEX",  "TROPONINI" in the last 168 hours.  HbA1C: Hemoglobin A1C  Date/Time Value Ref Range Status  06/29/2020 09:59 AM 5.2 4.0 - 5.6 % Final  05/31/2019 04:44 PM 5.7 (A) 4.0 - 5.6 % Final   Hgb A1c MFr Bld  Date/Time Value Ref Range Status  11/24/2018 10:08 AM 6.0 (H) <5.7 % of total Hgb Final    Comment:    For someone without known diabetes, a hemoglobin  A1c value between 5.7% and 6.4% is consistent with prediabetes and should be confirmed with a  follow-up test. . For someone with known diabetes, a value <7% indicates that their diabetes is well controlled. A1c targets should be individualized based on duration of diabetes, age, comorbid conditions, and other considerations. . This assay result is consistent with an increased risk of diabetes. . Currently, no consensus exists regarding use of hemoglobin A1c for diagnosis of diabetes for children. [name]   05/22/2018 09:36 AM 7.0 (H) <5.7 % of total Hgb Final    Comment:    For someone without known diabetes, a hemoglobin A1c value of 6.5% or greater  indicates that they may have  diabetes and this should be confirmed with a follow-up  test. . For someone with known diabetes, a value <7% indicates  that their diabetes is well controlled and a value  greater than or equal to 7% indicates suboptimal  control. A1c targets should be individualized based on  duration of diabetes, age, comorbid conditions, and  other considerations. . Currently, no consensus exists regarding use of hemoglobin A1c for diagnosis of diabetes for children. .     CBG: Recent Labs  Lab 03/10/22 0628  GLUCAP 224*    Review of Systems:   UNABLE TO OBTAIN DUE TO ALTERED MENTAL STATUS   Past Medical History  She,  has a past medical history of Allergy, Anxiety, Bursitis of both hips, Depression, Frequent headaches, Obesity, and Sleep apnea.   Surgical History    Past Surgical History:  Procedure Laterality Date   CESAREAN SECTION     x2    CHOLECYSTECTOMY     COLONOSCOPY WITH PROPOFOL N/A 11/20/2017   Procedure: COLONOSCOPY WITH PROPOFOL;  Surgeon: Lin Landsman, MD;  Location: [facility] ENDOSCOPY;  Service: Gastroenterology;  Laterality: N/A;   HYSTERECTOMY ABDOMINAL WITH SALPINGECTOMY  1998   HYSTEROSCOPY       Social History   reports that she has been smoking cigarettes. She has a 30.00 pack-year smoking history. She uses smokeless tobacco. She reports current alcohol use. She reports current drug use. Drug: Marijuana.   Family History   Her family history includes Alzheimer's disease in her maternal grandfather; Cancer in an other family member; Diabetes in her maternal aunt and maternal uncle; Emphysema in her paternal grandmother; Heart failure in her maternal grandmother; Hodgkin's lymphoma in her mother. There is no history of Breast cancer.   Allergies Allergies  Allergen Reactions   Piper Other (See Comments)    Black pepper Feels like throat is closing, itchy Feels like throat is closing, itchy Feels like throat is closing, itchy   Tape Rash    Paper tape is ok to use.     Home Medications  Prior to Admission medications   Medication Sig Start Date End Date Taking? Authorizing Provider  albuterol (VENTOLIN HFA) 108 (90 Base) MCG/ACT inhaler INHALE 2 PUFFS EVERY FOUR HOURS AS NEEDED 10/08/21   Parks Ranger, Devonne Doughty, DO  amLODipine (NORVASC) 10 MG tablet Take 1 tablet (10 mg total) by mouth daily. 07/04/21   Karamalegos, Devonne Doughty, DO  aspirin EC 81 MG tablet Take by mouth. 09/25/21 09/25/22  [provider]  BYDUREON BCISE 2 MG/0.85ML AUIJ INJECT 2 MG INTO SKIN EVERY WEEK 09/07/21   Karamalegos, Devonne Doughty, DO  clobetasol ointment (TEMOVATE) 0.05 % APPLY TO AFFECTED AREAS TWICE DAILY UNTIL IMPROVED 12/04/20   Parks Ranger, Devonne Doughty, DO  cloNIDine (CATAPRES) 0.3 MG tablet Take 0.3 mg by mouth 2 (two) times daily. 02/25/20   [provider]  diclofenac Sodium (VOLTAREN) 1 % GEL Apply 2 g  topically 4 (four) times daily as needed (arthritis hand/thumb). 11/08/20   Karamalegos, Devonne Doughty, DO  estradiol (ESTRACE) 1 MG tablet Take 1 tablet (1 mg total) by mouth daily. 56/38/75   Copland, Deirdre Evener, PA-C  fluticasone (FLONASE) 50 MCG/ACT nasal spray 1 SPARY IN EACH NOSTRIL DAILY 10/08/21   Karamalegos, Devonne Doughty, DO  gabapentin (NEURONTIN) 300 MG capsule Take 1 capsule (300 mg total) by mouth 3 (three) times daily. 12/18/21 64/3/32  Copland, Deirdre Evener, PA-C  linaclotide (LINZESS) 145 MCG CAPS capsule TAKE 1 CAPSULE (  [place] DAILY 01/03/20   [provider]  meloxicam (MOBIC) 7.5 MG tablet Take 7.5 mg by mouth daily. 10/15/21   [provider]  nortriptyline (PAMELOR) 25 MG capsule Take by mouth. 10/24/20   [provider]  pantoprazole (PROTONIX) 40 MG tablet Take 1 tablet (40 mg total) by mouth 2 (two) times daily before a meal. 09/03/21   Karamalegos, Devonne Doughty, DO  pregabalin (LYRICA) 150 MG capsule Take 150 mg by mouth 3 (three) times daily. 06/05/21   [provider]  promethazine (PHENERGAN) 25 MG tablet Take 1 tablet (25 mg total) by mouth every 6 (six) hours as needed for nausea or vomiting. 09/03/21   Parks Ranger, Devonne Doughty, DO  QUEtiapine (SEROQUEL) 300 MG tablet Take 300 mg by mouth at bedtime. 06/04/21   [provider]  sucralfate (CARAFATE) 1 g tablet TAKE 1 TABLET FOUR TIMES A DAY WITH MEALS AND AT BEDTIME AS NEEDED ONLY 06/04/21   Jearld Fenton, NP  SYMBICORT 160-4.5 MCG/ACT inhaler INHALE 2 PUFFS EVERY MORNING AND EVERY EVENING 10/18/21   Karamalegos, Devonne Doughty, DO  terbinafine (LAMISIL) 250 MG tablet Take 250 mg by mouth daily. 06/04/21   [provider]  tiotropium (SPIRIVA HANDIHALER) 18 MCG inhalation capsule INHALE CONTENTS OF ONE CAPSULE ONCE DAILY USING HANDIHALER DEVICE 10/08/21   Parks Ranger, Devonne Doughty, DO  valACYclovir (VALTREX) 1000 MG tablet TAKE 1 TABLET BY MOUTH DAILY 07/04/21    Karamalegos, Devonne Doughty, DO  Vilazodone HCl (VIIBRYD) 40 MG TABS Take by mouth. 10/24/21   [provider]   Scheduled Meds:  arformoterol  15 mcg Nebulization BID   budesonide (PULMICORT) nebulizer solution  0.5 mg Nebulization BID   Chlorhexidine Gluconate Cloth  6 each Topical Daily   heparin  5,000 Units Subcutaneous Q8H   methylPREDNISolone (SOLU-MEDROL) injection  40 mg Intravenous Q12H   pantoprazole (PROTONIX) IV  40 mg Intravenous Q24H   revefenacin  175 mcg Nebulization Daily   sodium zirconium cyclosilicate  10 g Oral BID   vancomycin variable dose per unstable renal function (pharmacist dosing)   Does not apply See admin instructions   Continuous Infusions:  sodium chloride 10 mL/hr at 03/10/22 0239   sodium chloride 75 mL/hr at 03/10/22 0457   ceFEPime (MAXIPIME) IV Stopped (03/10/22 0504)   famotidine (PEPCID) IV Stopped (03/10/22 0547)   metronidazole Stopped (03/10/22 0415)   norepinephrine (LEVOPHED) Adult infusion 3 mcg/min (03/10/22 8413)   sodium chloride     vancomycin 1,500 mg (03/10/22 0648)   PRN Meds:.albuterol, docusate sodium, ondansetron (ZOFRAN) IV, polyethylene glycol  Active Hospital Problem list     Assessment & Plan:   Sepsis, streptococcal bacteremia Acute Diarrhea  Concerns for Infectious Causes Plan: Continue IV hydration, additional liter of lactated Ringer's Continue broad-spectrum antibiotics, Vanco, cefepime, metronidazole Follow cultures and speciation  Rash appears to be consistent with hives Plan: Continue steroid Pepcid and Benadryl   AKI likely ATN in the setting of above Anion gap metabolic acidosis with Lactic Acidosis Plan: Follow urine output I's and O's, BMP Was hyperkalemic, given Lokelma, repeat BMP this afternoon.  Transaminitis, shock liver Plan: Likely related to sepsis, continue to observe, supportive care  UDS positive Amphetamines, cannabis, tricyclic's Plan: Supportive care   Best practice:   Diet:  NPO Pain/Anxiety/Delirium protocol (if indicated): No VAP protocol (if indicated): Not indicated DVT prophylaxis: LMWH GI prophylaxis: PPI Glucose control:  SSI No Central venous access:  Yes, and it is still needed  Arterial line:  N/A Foley:  Yes, and it is still needed Mobility:  bed rest  PT consulted: N/A Last date of multidisciplinary goals of care discussion [12/30] Code Status:  full code Disposition: ICU  This patient is critically ill with multiple organ system failure; which, requires frequent high complexity decision making, assessment, support, evaluation, and titration of therapies. This was completed through the application of advanced monitoring technologies and extensive interpretation of multiple databases. During this encounter critical care time was devoted to patient care services described in this note for 32 minutes.  Garner Nash, DO [facility] 03/10/2022 7:55 AM

## 2022-03-10 NOTE — Progress Notes (Addendum)
[facility] Progress Note Patient Name: Darlene Barber DOB: Nov 23, 1965 MRN: [id number]   Date of Service  03/10/2022  HPI/Events of Note  Patient is agitated and at risk of falling out of bed.  eICU Interventions  Bilateral wrist restraints ordered for safety and to prevent fall.  CBGs being monitored q 4 but ordered q 1. Has been stable without hypoglycemic episodes. Changed CBG monitoring to q 4.        Frederik Pear 03/10/2022, 6:36 AM

## 2022-03-10 NOTE — Progress Notes (Signed)
[facility] Progress Note Patient Name: Darlene Barber DOB: [birth year] MRN: [id number]   Date of Service  03/10/2022  HPI/Events of Note  Oliguria.  eICU Interventions  NS 250 ml iv bolus x 1.        Kerry Kass Maile Linford 03/10/2022, 6:59 AM

## 2022-03-10 NOTE — Progress Notes (Signed)
Pharmacy Antibiotic Note  Darlene Barber is a 56 y.o. female admitted on 03/10/2022 with bacteremia.  Pharmacy has been consulted for vancomycin dosing.  Plan: Vancomycin 1.5gm IV x 1 then consider random level in 24-48 hours based on Scr improvement. Follow renal function, cultures and clinical course  Weight: 80.6 kg (177 lb 11.1 oz)  Temp (24hrs), Avg:97.9 F (36.6 C), Min:97.5 F (36.4 C), Max:98.6 F (37 C)  Recent Labs  Lab 03/09/22 1350 03/09/22 1407 03/09/22 1752 03/09/22 1948 03/09/22 2204 03/10/22 0049 03/10/22 0308  WBC 29.2*  --   --   --   --   --  31.8*  CREATININE 2.64*  --   --  2.53*  --   --  3.24*  LATICACIDVEN  --  5.4* 3.3*  --  1.8 1.8  --     Estimated Creatinine Clearance: 17.4 mL/min (A) (by C-G formula based on SCr of 3.24 mg/dL (H)).    Allergies  Allergen Reactions   Piper Other (See Comments)    Black pepper Feels like throat is closing, itchy Feels like throat is closing, itchy Feels like throat is closing, itchy   Tape Rash    Paper tape is ok to use.     Thank you for allowing pharmacy to be a part of this patient's care.  Dolly Rias RPh 03/10/2022, 5:50 AM

## 2022-03-11 ENCOUNTER — Inpatient Hospital Stay (HOSPITAL_COMMUNITY): Payer: Medicaid Other

## 2022-03-11 DIAGNOSIS — J9601 Acute respiratory failure with hypoxia: Secondary | ICD-10-CM | POA: Diagnosis not present

## 2022-03-11 DIAGNOSIS — I10 Essential (primary) hypertension: Secondary | ICD-10-CM | POA: Diagnosis not present

## 2022-03-11 DIAGNOSIS — E1165 Type 2 diabetes mellitus with hyperglycemia: Secondary | ICD-10-CM | POA: Diagnosis present

## 2022-03-11 DIAGNOSIS — E669 Obesity, unspecified: Secondary | ICD-10-CM | POA: Diagnosis present

## 2022-03-11 DIAGNOSIS — R6521 Severe sepsis with septic shock: Secondary | ICD-10-CM | POA: Diagnosis not present

## 2022-03-11 DIAGNOSIS — R21 Rash and other nonspecific skin eruption: Secondary | ICD-10-CM | POA: Diagnosis not present

## 2022-03-11 DIAGNOSIS — A409 Streptococcal sepsis, unspecified: Secondary | ICD-10-CM | POA: Diagnosis present

## 2022-03-11 DIAGNOSIS — Z992 Dependence on renal dialysis: Secondary | ICD-10-CM | POA: Diagnosis not present

## 2022-03-11 DIAGNOSIS — N170 Acute kidney failure with tubular necrosis: Secondary | ICD-10-CM | POA: Diagnosis present

## 2022-03-11 DIAGNOSIS — Y848 Other medical procedures as the cause of abnormal reaction of the patient, or of later complication, without mention of misadventure at the time of the procedure: Secondary | ICD-10-CM | POA: Diagnosis not present

## 2022-03-11 DIAGNOSIS — E874 Mixed disorder of acid-base balance: Secondary | ICD-10-CM | POA: Diagnosis present

## 2022-03-11 DIAGNOSIS — K529 Noninfective gastroenteritis and colitis, unspecified: Secondary | ICD-10-CM | POA: Diagnosis not present

## 2022-03-11 DIAGNOSIS — F1910 Other psychoactive substance abuse, uncomplicated: Secondary | ICD-10-CM | POA: Diagnosis not present

## 2022-03-11 DIAGNOSIS — I5032 Chronic diastolic (congestive) heart failure: Secondary | ICD-10-CM | POA: Diagnosis present

## 2022-03-11 DIAGNOSIS — J440 Chronic obstructive pulmonary disease with acute lower respiratory infection: Secondary | ICD-10-CM | POA: Diagnosis not present

## 2022-03-11 DIAGNOSIS — F319 Bipolar disorder, unspecified: Secondary | ICD-10-CM | POA: Diagnosis present

## 2022-03-11 DIAGNOSIS — K559 Vascular disorder of intestine, unspecified: Secondary | ICD-10-CM | POA: Diagnosis present

## 2022-03-11 DIAGNOSIS — A419 Sepsis, unspecified organism: Secondary | ICD-10-CM | POA: Diagnosis present

## 2022-03-11 DIAGNOSIS — G928 Other toxic encephalopathy: Secondary | ICD-10-CM | POA: Diagnosis not present

## 2022-03-11 DIAGNOSIS — K633 Ulcer of intestine: Secondary | ICD-10-CM | POA: Diagnosis present

## 2022-03-11 DIAGNOSIS — J9600 Acute respiratory failure, unspecified whether with hypoxia or hypercapnia: Secondary | ICD-10-CM | POA: Diagnosis not present

## 2022-03-11 DIAGNOSIS — D500 Iron deficiency anemia secondary to blood loss (chronic): Secondary | ICD-10-CM | POA: Diagnosis not present

## 2022-03-11 DIAGNOSIS — R7881 Bacteremia: Secondary | ICD-10-CM | POA: Diagnosis not present

## 2022-03-11 DIAGNOSIS — K922 Gastrointestinal hemorrhage, unspecified: Secondary | ICD-10-CM | POA: Diagnosis not present

## 2022-03-11 DIAGNOSIS — I130 Hypertensive heart and chronic kidney disease with heart failure and stage 1 through stage 4 chronic kidney disease, or unspecified chronic kidney disease: Secondary | ICD-10-CM | POA: Diagnosis present

## 2022-03-11 DIAGNOSIS — E871 Hypo-osmolality and hyponatremia: Secondary | ICD-10-CM | POA: Diagnosis not present

## 2022-03-11 DIAGNOSIS — Z1152 Encounter for screening for COVID-19: Secondary | ICD-10-CM | POA: Diagnosis not present

## 2022-03-11 DIAGNOSIS — D649 Anemia, unspecified: Secondary | ICD-10-CM | POA: Diagnosis not present

## 2022-03-11 DIAGNOSIS — D696 Thrombocytopenia, unspecified: Secondary | ICD-10-CM | POA: Diagnosis present

## 2022-03-11 DIAGNOSIS — J95851 Ventilator associated pneumonia: Secondary | ICD-10-CM | POA: Diagnosis not present

## 2022-03-11 DIAGNOSIS — K921 Melena: Secondary | ICD-10-CM | POA: Diagnosis not present

## 2022-03-11 DIAGNOSIS — F05 Delirium due to known physiological condition: Secondary | ICD-10-CM | POA: Diagnosis not present

## 2022-03-11 DIAGNOSIS — N179 Acute kidney failure, unspecified: Secondary | ICD-10-CM | POA: Diagnosis not present

## 2022-03-11 DIAGNOSIS — F1210 Cannabis abuse, uncomplicated: Secondary | ICD-10-CM | POA: Diagnosis present

## 2022-03-11 DIAGNOSIS — D62 Acute posthemorrhagic anemia: Secondary | ICD-10-CM | POA: Diagnosis not present

## 2022-03-11 DIAGNOSIS — J189 Pneumonia, unspecified organism: Secondary | ICD-10-CM | POA: Diagnosis not present

## 2022-03-11 DIAGNOSIS — Y95 Nosocomial condition: Secondary | ICD-10-CM | POA: Diagnosis not present

## 2022-03-11 DIAGNOSIS — L509 Urticaria, unspecified: Secondary | ICD-10-CM | POA: Diagnosis not present

## 2022-03-11 DIAGNOSIS — E872 Acidosis, unspecified: Secondary | ICD-10-CM | POA: Diagnosis not present

## 2022-03-11 DIAGNOSIS — K7200 Acute and subacute hepatic failure without coma: Secondary | ICD-10-CM | POA: Diagnosis not present

## 2022-03-11 DIAGNOSIS — G9340 Encephalopathy, unspecified: Secondary | ICD-10-CM | POA: Diagnosis not present

## 2022-03-11 LAB — MAGNESIUM
Magnesium: 2.4 mg/dL (ref 1.7–2.4)
Magnesium: 2.4 mg/dL (ref 1.7–2.4)

## 2022-03-11 LAB — POTASSIUM
Potassium: 5.1 mmol/L (ref 3.5–5.1)
Potassium: 5.2 mmol/L — ABNORMAL HIGH (ref 3.5–5.1)
Potassium: 5.3 mmol/L — ABNORMAL HIGH (ref 3.5–5.1)
Potassium: 5.5 mmol/L — ABNORMAL HIGH (ref 3.5–5.1)
Potassium: 5.8 mmol/L — ABNORMAL HIGH (ref 3.5–5.1)

## 2022-03-11 LAB — BASIC METABOLIC PANEL
Anion gap: 16 — ABNORMAL HIGH (ref 5–15)
BUN: 79 mg/dL — ABNORMAL HIGH (ref 6–20)
CO2: 15 mmol/L — ABNORMAL LOW (ref 22–32)
Calcium: 7.6 mg/dL — ABNORMAL LOW (ref 8.9–10.3)
Chloride: 102 mmol/L (ref 98–111)
Creatinine, Ser: 5.08 mg/dL — ABNORMAL HIGH (ref 0.44–1.00)
GFR, Estimated: 9 mL/min — ABNORMAL LOW (ref >60)
Glucose, Bld: 103 mg/dL — ABNORMAL HIGH (ref 70–99)
Potassium: 5.7 mmol/L — ABNORMAL HIGH (ref 3.5–5.1)
Sodium: 133 mmol/L — ABNORMAL LOW (ref 135–145)

## 2022-03-11 LAB — RENAL FUNCTION PANEL
Albumin: 2.5 g/dL — ABNORMAL LOW (ref 3.5–5.0)
Anion gap: 17 — ABNORMAL HIGH (ref 5–15)
BUN: 88 mg/dL — ABNORMAL HIGH (ref 6–20)
CO2: 16 mmol/L — ABNORMAL LOW (ref 22–32)
Calcium: 7.7 mg/dL — ABNORMAL LOW (ref 8.9–10.3)
Chloride: 102 mmol/L (ref 98–111)
Creatinine, Ser: 5.51 mg/dL — ABNORMAL HIGH (ref 0.44–1.00)
GFR, Estimated: 9 mL/min — ABNORMAL LOW (ref >60)
Glucose, Bld: 117 mg/dL — ABNORMAL HIGH (ref 70–99)
Phosphorus: 3.6 mg/dL (ref 2.5–4.6)
Potassium: 5.4 mmol/L — ABNORMAL HIGH (ref 3.5–5.1)
Sodium: 135 mmol/L (ref 135–145)

## 2022-03-11 LAB — PHOSPHORUS
Phosphorus: 3.2 mg/dL (ref 2.5–4.6)
Phosphorus: 3.8 mg/dL (ref 2.5–4.6)

## 2022-03-11 LAB — BLOOD GAS, ARTERIAL
Acid-base deficit: 8.3 mmol/L — ABNORMAL HIGH (ref 0.0–2.0)
Bicarbonate: 18.4 mmol/L — ABNORMAL LOW (ref 20.0–28.0)
Drawn by: 11249
FIO2: 100 %
O2 Saturation: 95 %
Patient temperature: 36.8
pCO2 arterial: 41 mmHg (ref 32–48)
pH, Arterial: 7.26 — ABNORMAL LOW (ref 7.35–7.45)
pO2, Arterial: 89 mmHg (ref 83–108)

## 2022-03-11 LAB — GLUCOSE, CAPILLARY
Glucose-Capillary: 95 mg/dL (ref 70–99)
Glucose-Capillary: 98 mg/dL (ref 70–99)
Glucose-Capillary: 102 mg/dL — ABNORMAL HIGH (ref 70–99)
Glucose-Capillary: 106 mg/dL — ABNORMAL HIGH (ref 70–99)

## 2022-03-11 LAB — CBC
HCT: 33.1 % — ABNORMAL LOW (ref 36.0–46.0)
Hemoglobin: 11.4 g/dL — ABNORMAL LOW (ref 12.0–15.0)
MCH: 32.9 pg (ref 26.0–34.0)
MCHC: 34.4 g/dL (ref 30.0–36.0)
MCV: 95.4 fL (ref 80.0–100.0)
Platelets: 156 10*3/uL (ref 150–400)
RBC: 3.47 MIL/uL — ABNORMAL LOW (ref 3.87–5.11)
RDW: 16.5 % — ABNORMAL HIGH (ref 11.5–15.5)
WBC: 25.6 10*3/uL — ABNORMAL HIGH (ref 4.0–10.5)
nRBC: 0 % (ref 0.0–0.2)

## 2022-03-11 MED ORDER — MIDAZOLAM HCL 2 MG/2ML IJ SOLN
INTRAMUSCULAR | Status: AC
  Administered 2022-03-11: 1 mg
  Filled 2022-03-11: qty 2

## 2022-03-11 MED ORDER — MIDAZOLAM HCL 2 MG/2ML IJ SOLN
INTRAMUSCULAR | Status: AC
  Filled 2022-03-11: qty 2

## 2022-03-11 MED ORDER — DEXMEDETOMIDINE HCL IN NACL 400 MCG/100ML IV SOLN
0.0000 ug/kg/h | INTRAVENOUS | Status: AC
  Administered 2022-03-11: 0.4 ug/kg/h via INTRAVENOUS
  Administered 2022-03-12: 0.9 ug/kg/h via INTRAVENOUS
  Administered 2022-03-12: 0.7 ug/kg/h via INTRAVENOUS
  Filled 2022-03-11 (×3): qty 100

## 2022-03-11 MED ORDER — ROCURONIUM BROMIDE 10 MG/ML (PF) SYRINGE
PREFILLED_SYRINGE | INTRAVENOUS | Status: AC
  Filled 2022-03-11: qty 10

## 2022-03-11 MED ORDER — FENTANYL CITRATE PF 50 MCG/ML IJ SOSY
50.0000 ug | PREFILLED_SYRINGE | INTRAMUSCULAR | Status: DC | PRN
  Administered 2022-03-13: 100 ug via INTRAVENOUS

## 2022-03-11 MED ORDER — SODIUM BICARBONATE 8.4 % IV SOLN
INTRAVENOUS | Status: DC
  Filled 2022-03-11: qty 1000
  Filled 2022-03-11 (×2): qty 150
  Filled 2022-03-11: qty 1000

## 2022-03-11 MED ORDER — SODIUM ZIRCONIUM CYCLOSILICATE 10 G PO PACK: 10.0000 g | PACK | Freq: Every day | ORAL | Status: DC

## 2022-03-11 MED ORDER — VANCOMYCIN VARIABLE DOSE PER UNSTABLE RENAL FUNCTION (PHARMACIST DOSING): Status: DC

## 2022-03-11 MED ORDER — VANCOMYCIN HCL 500 MG/100ML IV SOLN
500.0000 mg | INTRAVENOUS | Status: DC
  Filled 2022-03-11: qty 100

## 2022-03-11 MED ORDER — MIDAZOLAM HCL 2 MG/2ML IJ SOLN: 1.0000 mg | INTRAMUSCULAR | Status: DC | PRN

## 2022-03-11 MED ORDER — FENTANYL CITRATE (PF) 100 MCG/2ML IJ SOLN
INTRAMUSCULAR | Status: AC
  Administered 2022-03-11: 100 ug
  Filled 2022-03-11: qty 2

## 2022-03-11 MED ORDER — VANCOMYCIN HCL 1750 MG/350ML IV SOLN
1750.0000 mg | Freq: Once | INTRAVENOUS | Status: DC
  Filled 2022-03-11: qty 350

## 2022-03-11 MED ORDER — ORAL CARE MOUTH RINSE: 15.0000 mL | OROMUCOSAL | Status: DC | PRN

## 2022-03-11 MED ORDER — VITAL HIGH PROTEIN PO LIQD
1000.0000 mL | ORAL | Status: DC
  Administered 2022-03-11 – 2022-03-12 (×2): 1000 mL

## 2022-03-11 MED ORDER — METHYLPREDNISOLONE SODIUM SUCC 40 MG IJ SOLR
40.0000 mg | Freq: Every day | INTRAMUSCULAR | Status: AC
  Administered 2022-03-12 – 2022-03-13 (×2): 40 mg via INTRAVENOUS
  Filled 2022-03-11 (×2): qty 1

## 2022-03-11 MED ORDER — ETOMIDATE 2 MG/ML IV SOLN
INTRAVENOUS | Status: AC
  Administered 2022-03-11: 20 mg
  Filled 2022-03-11: qty 20

## 2022-03-11 MED ORDER — HALOPERIDOL LACTATE 5 MG/ML IJ SOLN
2.0000 mg | Freq: Four times a day (QID) | INTRAMUSCULAR | Status: DC | PRN
  Administered 2022-03-11 – 2022-04-20 (×10): 2 mg via INTRAVENOUS
  Filled 2022-03-11 (×13): qty 1

## 2022-03-11 MED ORDER — ORAL CARE MOUTH RINSE
15.0000 mL | OROMUCOSAL | Status: DC
  Administered 2022-03-11 – 2022-03-12 (×4): 15 mL via OROMUCOSAL

## 2022-03-11 MED ORDER — PROSOURCE TF20 ENFIT COMPATIBL EN LIQD
60.0000 mL | Freq: Every day | ENTERAL | Status: DC
  Administered 2022-03-11 – 2022-03-25 (×15): 60 mL
  Filled 2022-03-11 (×15): qty 60

## 2022-03-11 MED ORDER — PHENYLEPHRINE 80 MCG/ML (10ML) SYRINGE FOR IV PUSH (FOR BLOOD PRESSURE SUPPORT)
PREFILLED_SYRINGE | INTRAVENOUS | Status: AC
  Filled 2022-03-11: qty 10

## 2022-03-11 MED ORDER — FENTANYL CITRATE PF 50 MCG/ML IJ SOSY
50.0000 ug | PREFILLED_SYRINGE | INTRAMUSCULAR | Status: AC | PRN
  Administered 2022-03-11 – 2022-03-12 (×3): 50 ug via INTRAVENOUS
  Filled 2022-03-11 (×3): qty 1

## 2022-03-11 NOTE — Progress Notes (Addendum)
[facility] Progress Note Patient Name: Darlene Barber DOB: 1965-09-10 MRN: [id number]   Date of Service  03/11/2022  HPI/Events of Note  Notified that patient is very agitated and confused. Not entirely new but worse at this time. Seen on camera. During her agitated phase, her O2 needs have gone up. She went from being on 6 liter o2 to now needing NRB plus the nasal oxygen. O2 sat is 90 on that. HR is in 130s. BP ok. Is getting Haldol and Ativan every 6 hours. RN had given Haldol an hour ago, ativan just now and she is still very restless and not able to calm down. Was on precedex last night and did ok on it per RN  eICU Interventions  Resume precedex CXR ABG High risk for needing ETT if her agitation does not get controlled and if o2 needs do not improve Will also try heated high flow o2 No vomiting, no overt aspiration witnessed     Intervention Category Major Interventions: Delirium, psychosis, severe agitation - evaluation and management  Stefanee Mckell G Janeah Kovacich 03/11/2022, 8:49 PM  Addendum at 9:45 pm - ABG and CXR reviewed. Continued metabolic acidosis but no hypercapnia. CXR does look worse. Is on Ceftriaxone for her bacteremia. Not sure if she has been aspirating, has been on tube feeds only but may have issues with oral secretions. Add vancomycin. Very calm at this time on HHFNC on 100% fio2 and at 40 liter flow. O2 sat is 93, RR is down to 16-28, HR down to 114 and bp stable on 0.6 mic of precedex. Monitor closely. Will hold tube feeds and check glucose Q4. If things change then she will need intubation. D/w RN on camera.   Addendum at 10:50 pm - D/w CCM MD Dr Elsworth Soho. Patient still maintaining o2 sat but remains at 100% fio2. Dr Elsworth Soho will evaluate for elective intubation  to allow for antibiotics etc to work . I spoke with patient's son and her boyfriend on camera and discussed the plan. They are emotional but agree.

## 2022-03-11 NOTE — Procedures (Signed)
Intubation Procedure Note  KYLAH MARESH  [id number]  [birth year]  Date:03/11/22  Time:11:40 PM   Provider Performing:Toryn Dewalt V. Pamila Mendibles    Procedure: Intubation (31500)  Indication(s) Respiratory Failure  Consent Risks of the procedure as well as the alternatives and risks of each were explained to the patient and/or caregiver.  Consent for the procedure was obtained and is signed in the bedside chart   Anesthesia Etomidate, Versed, and Fentanyl   Time Out Verified patient identification, verified procedure, site/side was marked, verified correct patient position, special equipment/implants available, medications/allergies/relevant history reviewed, required imaging and test results available.   Sterile Technique Usual hand hygeine, masks, and gloves were used   Procedure Description Patient positioned in bed supine.  Sedation given as noted above.  Patient was intubated with endotracheal tube using Glidescope.  View was Grade 1 full glottis .  Number of attempts was 1.  Colorimetric CO2 detector was consistent with tracheal placement.   Complications/Tolerance None; patient tolerated the procedure well. Chest X-ray is ordered to verify placement.   EBL Minimal   Specimen(s) None  Naomia Lenderman V. Elsworth Soho MD

## 2022-03-11 NOTE — TOC Initial Note (Signed)
Transition of Care [facility]) - Initial/Assessment Note    Patient Details  Name: Darlene Barber MRN: [id number] Date of Birth: [birth year]  [facility] CM/SW Contact:    Dessa Phi, RN Phone Number: 03/11/2022, 12:17 PM  Clinical Narrative:From home. Monitor for d/c plans.                   Expected Discharge Plan: Home/Self Care Barriers to Discharge: Continued Medical Work up   Patient Goals and CMS Choice            Expected Discharge Plan and Services                                              Prior Living Arrangements/Services                       Activities of Daily Living      Permission Sought/Granted                  Emotional Assessment              Admission diagnosis:  Septic shock ([place]) [A41.9, R65.21] Patient Active Problem List   Diagnosis Date Noted   Septic shock ([place]) 03/10/2022   Balance disorder 06/29/2020   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   Centrilobular emphysema ([place]) 05/31/2019   Chronic upper extremity pain (Secondary Area of Pain) (Bilateral) (L>R) 03/16/2018   Primary osteoarthritis involving multiple joints 03/16/2018   Vitamin D deficiency 03/16/2018   Cervicogenic headache 03/16/2018   Cervical facet hypertrophy 03/16/2018   Cervical facet syndrome (Bilateral) (L>R) 03/16/2018   Cervical foraminal stenosis (C4-C7) (Bilateral) (L>R) 03/16/2018   Cervical central spinal stenosis (C5-6) 03/16/2018   Cervical spondylitis w/ radiculitis ([place]) 03/16/2018   Cervical radiculitis (Bilateral) (L>R) 03/16/2018   Osteoarthritis of hips (Bilateral) 03/16/2018   DDD (degenerative disc disease), cervical 03/16/2018   Marijuana use 03/16/2018   Migraines 02/12/2018   Seasonal allergies 02/12/2018   Chronic neck pain (Primary Area of Pain) (Bilateral) (L>R) 02/12/2018   Chronic hip pain (Tertiary Area of Pain) (Bilateral) (R>L) 02/12/2018   Chronic lower extremity pain (Fourth  Area of Pain) (Right) 02/12/2018   Chronic pain syndrome 02/12/2018   Opiate use 02/12/2018   Pharmacologic therapy 02/12/2018   Disorder of skeletal system 02/12/2018   Problems influencing health status 02/12/2018   Hyperlipidemia associated with type 2 diabetes mellitus ([place]) 11/20/2017   Encounter for screening colonoscopy    Cervicalgia 10/17/2017   Tremor 09/16/2017   Drug-induced tremor 12/16/2016   Vasomotor flushing 09/10/2016   Post menopausal syndrome 08/21/2016   Controlled type 2 diabetes mellitus with complication, without long-term current use of insulin ([place]) 08/21/2016   Bipolar depression ([place]) 08/20/2016   Insomnia 08/20/2016   Essential hypertension 08/20/2016   Tobacco abuse 08/20/2016   Right leg swelling 01/30/2016   Trochanteric bursitis of hip (Right) 01/30/2016   Numbness and tingling 11/15/2014   Obesity (BMI 30.0-34.9) 11/15/2014   OSA on CPAP 11/15/2014   Abdominal bloating 12/06/2013   Mixed incontinence urge and stress 08/22/2012   Atrophy of vagina 08/22/2012   Dyspareunia 08/22/2012   Gastroesophageal reflux disease without esophagitis 06/19/2011   Flatulence, eructation and gas pain 06/19/2011   PCP:  [name], MD Pharmacy:   [place], [place] [facility] [place]  [address] [place] [address] Phone: [phone] Fax: [phone]     Social Determinants of Health (SDOH) Social History: SDOH Screenings   Depression (PHQ2-9): High Risk (09/03/2021)  Tobacco Use: High Risk (01/07/2022)   SDOH Interventions:     Readmission Risk Interventions     No data to display

## 2022-03-11 NOTE — Progress Notes (Signed)
Fingers extremely edematous, rings causing restricion. Able to remove 2 rings without issues. Other 8 had to be cut to relieve blood flow. All 10 rings were placed in a labeled container. Significant other present and aware.

## 2022-03-11 NOTE — Progress Notes (Signed)
Pharmacy Antibiotic Note  Darlene Barber is a 56 y.o. female admitted on 03/10/2022 with bacteremia.  On admission patient was started on Cefepime, Metronidazole and Vancomycin.  On 12/31 antibiotics changed to ceftriaxone only.  1/1 pm Pharmacy has been consulted to resume Vancomycin dosing. Worsening CXR noted.  Patient received Vancomycin 1gm IV x 1 12/30 @ 1529 followed by Vancomycin 1500mg  IV x 1 12/31 @ 06:48.  [name] renal function has worsened since admission SCr 2.64 (12/30) >> 5.51 (03/11/22)  Plan: Check random Vancomycin level with AM labs on 1/2 to assess clearance of previous vancomycin doses, especially with worsening renal function Will re-dose vancomycin based on results of level  Weight: 83.8 kg (184 lb 11.9 oz)  Temp (24hrs), Avg:98.5 F (36.9 C), Min:97.9 F (36.6 C), Max:98.8 F (37.1 C)  Recent Labs  Lab 03/09/22 1350 03/09/22 1407 03/09/22 1752 03/09/22 1948 03/09/22 2204 03/10/22 0049 03/10/22 0308 03/10/22 0821 03/10/22 1241 03/11/22 0539 03/11/22 1642  WBC 29.2*  --   --   --   --   --  31.8*  --   --  25.6*  --   CREATININE 2.64*  --   --    < >  --   --  3.24* 3.48* 3.74* 5.08* 5.51*  LATICACIDVEN  --  5.4* 3.3*  --  1.8 1.8  --   --   --   --   --    < > = values in this interval not displayed.    Estimated Creatinine Clearance: 10.5 mL/min (A) (by C-G formula based on SCr of 5.51 mg/dL (H)).    Allergies  Allergen Reactions   Piper Other (See Comments)    Black pepper Feels like throat is closing, itchy Feels like throat is closing, itchy Feels like throat is closing, itchy   Tape Rash    Paper tape is ok to use.    Antimicrobials this admission: 12/30 cefepime>12/31 12/30 flagyl>12/31 12/30 Vanc 1 gm @ [facility] & 1.5 gm@ [facility]>dc 12/31 1/1 resume Vanc >> 12/31 CTX>>  Dose adjustments this admission:    Microbiology results: 12/30 BCx2: 3/4 bottles: strep species (NOT enterococcus/agalactiae/pyogenes/pneumo)  12/30 Ucx: 12/30  MRSA 12/30 Cdiff - 12/30 GI panel - 12/30: COVID/Flu: neg   Thank you for allowing pharmacy to be a part of this patient's care.  Everette Rank, PharmD 03/11/2022 11:05 PM

## 2022-03-11 NOTE — Consult Note (Signed)
[facility] Renal Consultation Note  Requesting MD: Icard Indication for Consultation: AKI  HPI:  Darlene Barber is a 57 y.o. female with HTN, T2DM, chronic pain as well as bipolar d/o and daily tobacco and marijuana use-  OP meds include valtrex 1000 daily and mobic also listed as daily-  no history of renal dysfunction -  crt 0.8 in August of this year.  She presented to medical attention on 12/30 with a rash and SOB/N/V/D.  She had leukocytosis and BP became low requiring pressor support.  Work up showed stool positive for campylobacter then blood cultures grew out strep-  overall has improved with antibiotic  treatment - pressors and O2 req being weaned.  Crt on arrival was 2.6 but has worsened over the course of hospitalization being 5 today and that is the reason for consult -  urine output has been low-  urinalysis shows >300 of protein and 11-20 RBC per HPF, >50 WBC-  there was a urine in the system from 10/23 that was bland. Renal imaging by CT-  non obstructive stones but nothing else mentioned.  K got as high as 6.5 but now 5.8 after being treated medically.  Patient is currently encephalopathic-  not able to get history from her   Creatinine  Date/Time Value Ref Range Status  04/20/2012 05:25 PM 0.65 0.60 - 1.30 mg/dL Final  08/27/2011 05:58 AM 0.85 0.60 - 1.30 mg/dL Final  05/11/2011 07:55 PM 1.49 (H) 0.60 - 1.30 mg/dL Final   Creat  Date/Time Value Ref Range Status  09/30/2019 10:57 AM 0.83 0.50 - 1.05 mg/dL Final    Comment:    For patients >69 years of age, the reference limit for Creatinine is approximately 13% higher for people identified as African-American. [name]   11/24/2018 10:08 AM 0.74 0.50 - 1.05 mg/dL Final    Comment:    For patients >78 years of age, the reference limit for Creatinine is approximately 13% higher for people identified as African-American. .   05/22/2018 09:36 AM 0.78 0.50 - 1.05 mg/dL Final    Comment:    For patients >49 years  of age, the reference limit for Creatinine is approximately 13% higher for people identified as African-American. .   11/19/2017 08:12 AM 0.69 0.50 - 1.05 mg/dL Final    Comment:    For patients >64 years of age, the reference limit for Creatinine is approximately 13% higher for people identified as African-American. [name]   11/12/2016 11:09 AM 0.71 0.50 - 1.05 mg/dL Final    Comment:      For patients > or = 57 years of age: The upper reference limit for Creatinine is approximately 13% higher for people identified as African-American.      Creatinine, Ser  Date/Time Value Ref Range Status  03/11/2022 05:39 AM 5.08 (H) 0.44 - 1.00 mg/dL Final  03/10/2022 12:41 PM 3.74 (H) 0.44 - 1.00 mg/dL Final  03/10/2022 08:21 AM 3.48 (H) 0.44 - 1.00 mg/dL Final  03/10/2022 03:08 AM 3.24 (H) 0.44 - 1.00 mg/dL Final  03/09/2022 07:48 PM 2.53 (H) 0.44 - 1.00 mg/dL Final  03/09/2022 01:50 PM 2.64 (H) 0.44 - 1.00 mg/dL Final   10/10/2021   crt 0.8  PMHx:   Past Medical History:  Diagnosis Date   Allergy    Anxiety    Bursitis of both hips    Depression    Frequent headaches    Obesity    Sleep apnea    doesn't use  CPAP machine broken,     Past Surgical History:  Procedure Laterality Date   CESAREAN SECTION     x2   CHOLECYSTECTOMY     COLONOSCOPY WITH PROPOFOL N/A 11/20/2017   Procedure: COLONOSCOPY WITH PROPOFOL;  Surgeon: Lin Landsman, MD;  Location: [facility] ENDOSCOPY;  Service: Gastroenterology;  Laterality: N/A;   HYSTERECTOMY ABDOMINAL WITH SALPINGECTOMY  1998   HYSTEROSCOPY      Family Hx:  Family History  Problem Relation Age of Onset   Hodgkin's lymphoma Mother    Heart failure Maternal Grandmother    Alzheimer's disease Maternal Grandfather    Emphysema Paternal Grandmother    Diabetes Maternal Aunt    Diabetes Maternal Uncle    Cancer Other    Breast cancer Neg Hx     Social History:  reports that she has been smoking cigarettes. She has a 30.00 pack-year  smoking history. She uses smokeless tobacco. She reports current alcohol use. She reports current drug use. Drug: Marijuana.  Allergies:  Allergies  Allergen Reactions   Piper Other (See Comments)    Black pepper Feels like throat is closing, itchy Feels like throat is closing, itchy Feels like throat is closing, itchy   Tape Rash    Paper tape is ok to use.    Medications: Prior to Admission medications   Medication Sig Start Date End Date Taking? Authorizing Provider  albuterol (VENTOLIN HFA) 108 (90 Base) MCG/ACT inhaler INHALE 2 PUFFS EVERY FOUR HOURS AS NEEDED 10/08/21   Parks Ranger, [place]ne Doughty, DO  amLODipine (NORVASC) 10 MG tablet Take 1 tablet (10 mg total) by mouth daily. 07/04/21   Karamalegos, [place]ne Doughty, DO  aspirin EC 81 MG tablet Take by mouth. 09/25/21 09/25/22  [provider]  BYDUREON BCISE 2 MG/0.85ML AUIJ INJECT 2 MG INTO SKIN EVERY WEEK 09/07/21   Karamalegos, Devonne Doughty, DO  clobetasol ointment (TEMOVATE) 0.05 % APPLY TO AFFECTED AREAS TWICE DAILY UNTIL IMPROVED 12/04/20   Parks Ranger, [place]ne Doughty, DO  cloNIDine (CATAPRES) 0.3 MG tablet Take 0.3 mg by mouth 2 (two) times daily. 02/25/20   [provider]  diclofenac Sodium (VOLTAREN) 1 % GEL Apply 2 g topically 4 (four) times daily as needed (arthritis hand/thumb). 11/08/20   Karamalegos, [place]ne Doughty, DO  estradiol (ESTRACE) 1 MG tablet Take 1 tablet (1 mg total) by mouth daily. 50/53/97   Copland, Deirdre Evener, PA-C  fluticasone (FLONASE) 50 MCG/ACT nasal spray 1 SPARY IN EACH NOSTRIL DAILY 10/08/21   Karamalegos, Devonne Doughty, DO  gabapentin (NEURONTIN) 300 MG capsule Take 1 capsule (300 mg total) by mouth 3 (three) times daily. 12/18/21 67/3/41  Copland, Deirdre Evener, PA-C  linaclotide (LINZESS) 145 MCG CAPS capsule TAKE 1 CAPSULE (145 MCG TOTAL) BY MOUTH ONCE DAILY 01/03/20   [provider]  meloxicam (MOBIC) 7.5 MG tablet Take 7.5 mg by mouth daily. 10/15/21   [provider]  nortriptyline  (PAMELOR) 25 MG capsule Take by mouth. 10/24/20   [provider]  pantoprazole (PROTONIX) 40 MG tablet Take 1 tablet (40 mg total) by mouth 2 (two) times daily before a meal. 09/03/21   Karamalegos, Devonne Doughty, DO  pregabalin (LYRICA) 150 MG capsule Take 150 mg by mouth 3 (three) times daily. 06/05/21   [provider]  promethazine (PHENERGAN) 25 MG tablet Take 1 tablet (25 mg total) by mouth every 6 (six) hours as needed for nausea or vomiting. 09/03/21   Parks Ranger, Devonne Doughty, DO  QUEtiapine (SEROQUEL) 300 MG tablet Take 300  mg by mouth at bedtime. 06/04/21   [provider]  sucralfate (CARAFATE) 1 g tablet TAKE 1 TABLET FOUR TIMES A DAY WITH MEALS AND AT BEDTIME AS NEEDED ONLY 06/04/21   Jearld Fenton, NP  SYMBICORT 160-4.5 MCG/ACT inhaler INHALE 2 PUFFS EVERY MORNING AND EVERY EVENING 10/18/21   Karamalegos, Devonne Doughty, DO  terbinafine (LAMISIL) 250 MG tablet Take 250 mg by mouth daily. 06/04/21   [provider]  tiotropium (SPIRIVA HANDIHALER) 18 MCG inhalation capsule INHALE CONTENTS OF ONE CAPSULE ONCE DAILY USING HANDIHALER DEVICE 10/08/21   Parks Ranger, Devonne Doughty, DO  valACYclovir (VALTREX) 1000 MG tablet TAKE 1 TABLET BY MOUTH DAILY 07/04/21   Karamalegos, Devonne Doughty, DO  Vilazodone HCl (VIIBRYD) 40 MG TABS Take by mouth. 10/24/21   [provider]    I have reviewed the patient's current medications.  Labs:  Results for orders placed or performed during the hospital encounter of 03/10/22 (from the past 48 hour(s))  Blood gas, arterial     Status: Abnormal   Collection Time: 03/10/22  1:16 AM  Result Value Ref Range   O2 Content 15 L L/min   Delivery systems SALTER    pH, Arterial 7.23 (L) 7.35 - 7.45   pCO2 arterial 42 32 - 48 mmHg   pO2, Arterial 99 83 - 108 mmHg   Bicarbonate 18.0 (L) 20.0 - 28.0 mmol/L   Acid-base deficit 9.2 (H) 0.0 - 2.0 mmol/L   O2 Saturation 95.6 %   Patient temperature 36.7    Collection site RIGHT RADIAL     Allens test (pass/fail) PASS PASS    Comment: Performed at [facility], [place] [address] [place], [place] [address]  HIV Antibody (routine testing w rflx)     Status: None   Collection Time: 03/10/22  3:08 AM  Result Value Ref Range   HIV Screen 4th Generation wRfx Non Reactive Non Reactive    Comment: Performed at [facility], [place] [address], [place], [place] [address]  CBC     Status: Abnormal   Collection Time: 03/10/22  3:08 AM  Result Value Ref Range   WBC 31.8 (H) 4.0 - 10.5 K/uL   RBC 4.53 3.87 - 5.11 MIL/uL   Hemoglobin 14.6 12.0 - 15.0 g/dL   HCT 44.3 36.0 - 46.0 %   MCV 97.8 80.0 - 100.0 fL   MCH 32.2 26.0 - 34.0 pg   MCHC 33.0 30.0 - 36.0 g/dL   RDW 16.6 (H) 11.5 - 15.5 %   Platelets 275 150 - 400 K/uL   nRBC 0.0 0.0 - 0.2 %    Comment: Performed at [facility], [place] [address], [place], [place] [address]  Basic metabolic panel     Status: Abnormal   Collection Time: 03/10/22  3:08 AM  Result Value Ref Range   Sodium 134 (L) 135 - 145 mmol/L   Potassium 6.5 (HH) 3.5 - 5.1 mmol/L    Comment: CRITICAL RESULT CALLED TO, READ BACK BY AND VERIFIED WITH DAVIS,R RN ON 03/10/22 AT 0357 BY GOLSONM    Chloride 106 98 - 111 mmol/L   CO2 17 (L) 22 - 32 mmol/L   Glucose, Bld 150 (H) 70 - 99 mg/dL    Comment: Glucose reference range applies only to samples taken after fasting for at least 8 hours.   BUN 48 (H) 6 - 20 mg/dL   Creatinine, Ser 3.24 (H) 0.44 - 1.00 mg/dL   Calcium 8.0 (  L) 8.9 - 10.3 mg/dL   GFR, Estimated 16 (L) >60 mL/min    Comment: (NOTE) Calculated using the CKD-EPI Creatinine Equation (2021)    Anion gap 11 5 - 15    Comment: Performed at [facility], [place] [address] [place], [place] [address]  Magnesium     Status: Abnormal   Collection Time: 03/10/22  3:08 AM  Result Value Ref Range   Magnesium 2.5 (H) 1.7 - 2.4 mg/dL    Comment: Performed at [facility], [place]  [address], [place], [place] [address]  Phosphorus     Status: None   Collection Time: 03/10/22  3:08 AM  Result Value Ref Range   Phosphorus 3.7 2.5 - 4.6 mg/dL    Comment: Performed at [facility], [place] [address] [place], [place] [address]  Brain natriuretic peptide     Status: Abnormal   Collection Time: 03/10/22  3:08 AM  Result Value Ref Range   B Natriuretic Peptide 203.6 (H) 0.0 - 100.0 pg/mL    Comment: Performed at [facility], [place] [address], [place], [place] [address]  Glucose, capillary     Status: Abnormal   Collection Time: 03/10/22  6:28 AM  Result Value Ref Range   Glucose-Capillary 224 (H) 70 - 99 mg/dL    Comment: Glucose reference range applies only to samples taken after fasting for at least 8 hours.   Comment 1 Notify RN    Comment 2 Document in Chart   CK     Status: None   Collection Time: 03/10/22  8:21 AM  Result Value Ref Range   Total CK 144 38 - 234 U/L    Comment: Performed at [facility], [place] [address] [place], [place] [address]  Basic metabolic panel     Status: Abnormal   Collection Time: 03/10/22  8:21 AM  Result Value Ref Range   Sodium 136 135 - 145 mmol/L   Potassium 5.5 (H) 3.5 - 5.1 mmol/L   Chloride 109 98 - 111 mmol/L   CO2 16 (L) 22 - 32 mmol/L   Glucose, Bld 176 (H) 70 - 99 mg/dL    Comment: Glucose reference range applies only to samples taken after fasting for at least 8 hours.   BUN 55 (H) 6 - 20 mg/dL   Creatinine, Ser 3.48 (H) 0.44 - 1.00 mg/dL   Calcium 7.1 (L) 8.9 - 10.3 mg/dL   GFR, Estimated 15 (L) >60 mL/min    Comment: (NOTE) Calculated using the CKD-EPI Creatinine Equation (2021)    Anion gap 11 5 - 15    Comment: Performed at [facility], [place] [address] [place], [place] [address]  Potassium     Status: Abnormal   Collection Time: 03/10/22 12:41 PM  Result Value Ref Range   Potassium 5.5 (H) 3.5 - 5.1 mmol/L    Comment: Performed at [facility], [place] [address], [place], [place] [address]  Basic metabolic panel     Status: Abnormal   Collection Time: 03/10/22 12:41 PM  Result Value Ref Range   Sodium 135 135 - 145 mmol/L   Potassium 5.5 (H) 3.5 - 5.1 mmol/L   Chloride 108 98 - 111 mmol/L   CO2 15 (L) 22 - 32 mmol/L   Glucose, Bld 132 (H) 70 - 99 mg/dL    Comment: Glucose reference range applies only to samples taken after fasting for at least 8 hours.  BUN 61 (H) 6 - 20 mg/dL   Creatinine, Ser 3.74 (H) 0.44 - 1.00 mg/dL   Calcium 7.4 (L) 8.9 - 10.3 mg/dL   GFR, Estimated 14 (L) >60 mL/min    Comment: (NOTE) Calculated using the CKD-EPI Creatinine Equation (2021)    Anion gap 12 5 - 15    Comment: Performed at [facility], [place] [address] [place], [place] [address]  Potassium     Status: Abnormal   Collection Time: 03/10/22  4:41 PM  Result Value Ref Range   Potassium 5.9 (H) 3.5 - 5.1 mmol/L    Comment: Performed at [facility], [place] [address] [place], [place] [address]  Glucose, capillary     Status: None   Collection Time: 03/10/22  8:12 PM  Result Value Ref Range   Glucose-Capillary 95 70 - 99 mg/dL    Comment: Glucose reference range applies only to samples taken after fasting for at least 8 hours.  Potassium     Status: Abnormal   Collection Time: 03/10/22  8:22 PM  Result Value Ref Range   Potassium 6.3 (HH) 3.5 - 5.1 mmol/L    Comment: [facility] CRITICAL RESULT CALLED TO, READ BACK BY AND VERIFIED WITH FOUSHE,RN 03/10/22 @2120  BY SEEL,M. Performed at [facility], [place] [address], [place], [place] [address]   Glucose, capillary     Status: Abnormal   Collection Time: 03/10/22 11:49 PM  Result Value Ref Range   Glucose-Capillary 101 (H) 70 - 99 mg/dL    Comment: Glucose reference range applies only to samples taken after fasting for at least 8 hours.  Potassium     Status: Abnormal   Collection Time: 03/11/22 12:50 AM  Result  Value Ref Range   Potassium 5.2 (H) 3.5 - 5.1 mmol/L    Comment: Performed at [facility], [place] [address] [place], [place] [address]  Glucose, capillary     Status: None   Collection Time: 03/11/22  4:28 AM  Result Value Ref Range   Glucose-Capillary 95 70 - 99 mg/dL    Comment: Glucose reference range applies only to samples taken after fasting for at least 8 hours.  CBC     Status: Abnormal   Collection Time: 03/11/22  5:39 AM  Result Value Ref Range   WBC 25.6 (H) 4.0 - 10.5 K/uL   RBC 3.47 (L) 3.87 - 5.11 MIL/uL   Hemoglobin 11.4 (L) 12.0 - 15.0 g/dL   HCT 33.1 (L) 36.0 - 46.0 %   MCV 95.4 80.0 - 100.0 fL   MCH 32.9 26.0 - 34.0 pg   MCHC 34.4 30.0 - 36.0 g/dL   RDW 16.5 (H) 11.5 - 15.5 %   Platelets 156 150 - 400 K/uL   nRBC 0.0 0.0 - 0.2 %    Comment: Performed at [facility], [place] [address] [place], [place] [address]  Basic metabolic panel     Status: Abnormal   Collection Time: 03/11/22  5:39 AM  Result Value Ref Range   Sodium 133 (L) 135 - 145 mmol/L   Potassium 5.7 (H) 3.5 - 5.1 mmol/L   Chloride 102 98 - 111 mmol/L   CO2 15 (L) 22 - 32 mmol/L   Glucose, Bld 103 (H) 70 - 99 mg/dL    Comment: Glucose reference range applies only to samples taken after fasting for at least 8 hours.   BUN 79 (H) 6 - 20 mg/dL   Creatinine, Ser  5.08 (H) 0.44 - 1.00 mg/dL   Calcium 7.6 (L) 8.9 - 10.3 mg/dL   GFR, Estimated 9 (L) >60 mL/min    Comment: (NOTE) Calculated using the CKD-EPI Creatinine Equation (2021)    Anion gap 16 (H) 5 - 15    Comment: Performed at [facility], [place] [address] [place], [place] [address]  Glucose, capillary     Status: None   Collection Time: 03/11/22  7:58 AM  Result Value Ref Range   Glucose-Capillary 98 70 - 99 mg/dL    Comment: Glucose reference range applies only to samples taken after fasting for at least 8 hours.  Potassium     Status: Abnormal   Collection Time: 03/11/22  8:37 AM   Result Value Ref Range   Potassium 5.8 (H) 3.5 - 5.1 mmol/L    Comment: Performed at [facility], [place] [address], [place], [place] [address]  Magnesium     Status: None   Collection Time: 03/11/22  8:37 AM  Result Value Ref Range   Magnesium 2.4 1.7 - 2.4 mg/dL    Comment: Performed at [facility], [place] [address], [place], [place] [address]  Phosphorus     Status: None   Collection Time: 03/11/22  8:37 AM  Result Value Ref Range   Phosphorus 3.2 2.5 - 4.6 mg/dL    Comment: Performed at [facility], [place] [address], [place], [place] [address]     ROS:  Review of systems not obtained due to patient factors.  Physical Exam: Vitals:   03/11/22 0812 03/11/22 0900  BP:  91/60  Pulse:  97  Resp:  16  Temp:    SpO2: 93% 93%     General: pt moaning-  unable to answer questions-  SO at bedside  HEENT: PERRLA, mucous membranes moist   Neck: no JVD Heart: tachy Lungs: CBS bilat Abdomen: soft, non tender, non distended Extremities: no edema-   Skin:slightly raised rash agree looks like hives Neuro: alert, encephalopathic   Assessment/Plan: 56 year old WF with many medical issues-  now presents with AKI in the setting of a strep bacteremia, volume depletion  1.Renal- crt of 0.8 in August of 2023, bland UA in October of 23.  Now with AKI , proteinuria and hematuria but also pyuria.  With common things being common-  this seems to be AKI from ATN in the setting of bacteremia and sepsis- CK normal.  Her active urinary sediment brings to mind other etiologies of AKI-  will check an ANA, ANCA and complements-  could have a strep related GN?  The treatment would be supportive either way.  Pt does not have any absolute indications for HD-  the only borderline parameters are the K and bicarb.  Trying to temporize with lokelma have also started a bicarb drip-  will inc rate of bicarb drip and follow closely with you.  There is a fairly  good chance she will need CRRT before all is said and done-  I have informed the SO of that. CCM aware as well 2. Hypertension/volume  - is not overloaded-  giving bicarb- will inc rate to 100 per hour 3. Hyperkalemia-  due to AKI-  given temporizing measures so far keeping below 6-  bicarb as above-  checking q 4 hours 4. Metabolic acidosis-  bicarb drip  5. Anemia  - not a major issue 6. Strep bacteremia-  on rocephin per CCM   Macon Lesesne A  Ramari Bray 03/11/2022, 11:12 AM

## 2022-03-11 NOTE — Progress Notes (Signed)
NAME:  Darlene Barber, MRN:  [id number], DOB:  [birth year], LOS: 1 ADMISSION DATE:  03/10/2022, CONSULTATION DATE:  03/09/2022 REFERRING MD:  Conni Slipper  CHIEF COMPLAINT:  Abdominal Pain   HPI   57  y.o with significant PMH of chronic pain syndrome, cervical spondylitis, GERD, Bipolar Disorder, current everyday smoker and Marijuana use  who presented to the ED with chief complaints of abdominal pain and widespread generalized rash.  Per patient's significant other who is currently at the bedside, symptoms developed a day after patient ate at a local restaurant in [place]. [name] significant other report that patient was up all night with crampy abdominal pain associated with nausea and vomiting. Today at around 2 pm, they noticed a widespread rash all her her body including her chin area with now worsening pain prompting him to call EMS.    ED Course: Initial vital signs showed HR of 58 beats/minute, BP mm 133/38 Hg, the RR 22 , and the oxygen saturation 87% on 2 L and a temperature of 97.71F (36.4C).  Pertinent labs revealed elevated white count  WBC 29.2, electrolytes (sodium 140 mmol/L, potassium 4.7?mmol/L, there was acute kidney injury (urea 13.4?mmol/L, BUN/creatinine 34./2.64, eGFR 21, anion gap metabolic acidosis, and liver function tests showed a mixed hepatitic and cholestatic picture (Bilirubin 1.4, AST 311?U/L, ALT 122 U/L, ALP 75 U/L, and albumin 3.0). . Stool samples were sent as work up of acute diarrhoea and campylobacter species was isolated. The stool samples were negative for Giardia, Cryptosporidium, Salmonella, Shigella and E. coli. Clostridium difficile toxin was negative. Patient given 30 cc/kg of fluids and started on broad-spectrum antibiotics Vanco cefepime and Flagyl for  suspected sepsis of intraabdominal source.  Patient remained hypotensive despite IVF boluses therefore was started on Levophed.  PCCM consulted to assist with management pending transfer to [facility] as  there were no beds at [facility].  Past Medical History  chronic pain syndrome, cervical spondylitis, GERD, Bipolar Disorder, current everyday smoker and Marijuana use  Significant Hospital Events   12/30: admit to [facility] with acute diarrhea, PCCM consulted for management pending transfer  Consults:  GI  Procedures:  12/30: Left IJ Central Line  Significant Diagnostic Tests:  12/30: Chest Xray>Cardiomegaly. Increased interstitial markings are seen in parahilar regions and lower lung fields suggesting possible mild interstitial edema. There are linear densities in left lower lung fields suggesting subsegmental atelectasis. Possible minimal bilateral pleural effusions. 12/30: CT Chest abdomen and pelvis>1. Fluid throughout the stomach, small bowel and colon, nonspecific but may represent a diarrheal state. No bowel obstruction, definite bowel wall thickening or pneumoperitoneum. 2. Fluid within the esophagus which may represent reflux or nonspecific esophageal motility disorder. 3. Trace amount of free pelvic fluid, nonspecific. 4. Punctate nonobstructing bilateral renal calculi. 5.  Aortic Atherosclerosis (ICD10-I70.0).  Micro Data:  12/30: SARS-CoV-2 PCR> negative 12/30: Influenza PCR> negative 12/30: Blood culture x2> 12/30: Urine Culture> 12/30: MRSA PCR>>  12/31 BCX + Strep   Antimicrobials:  Vancomycin 12/30 x 1 Cefepime 12/30 x 1 Metronidazole 12/30 x 1 Zosyn 12/30>  OBJECTIVE  Blood pressure 100/65, pulse 99, temperature 97.9 F (36.6 C), temperature source Axillary, resp. rate (!) 22, weight 83.8 kg, SpO2 93 %.        Intake/Output Summary (Last 24 hours) at 03/11/2022 0924 Last data filed at 03/11/2022 0800 Gross per 24 hour  Intake 3228.16 ml  Output 115 ml  Net 3113.16 ml    Filed Weights   03/10/22 0242 03/11/22 0500  Weight: 80.6  kg 83.8 kg   Physical Examination   GENERAL: 57 year old female, chronically ill-appearing, lying in bed, confused EYES: Opens to  voice HEENT: Nasogastric tube in place, NCAT LUNGS: Clear to auscultation bilaterally no crackles no wheeze CARDIOVASCULAR: Regular rate, S1-S2 ABDOMEN: Tenderness to palpation, mildly distended EXTREMITIES: Moves all extremities, multiple rings left on fingers now with hand edema these will need to be removed discussed with nursing NEUROLOGIC: Alert to voice, still confused SKIN: Rash improved   Labs   CBC: Recent Labs  Lab 03/09/22 1350 03/10/22 0308 03/11/22 0539  WBC 29.2* 31.8* 25.6*  NEUTROABS 25.2*  --   --   HGB 16.0* 14.6 11.4*  HCT 48.7* 44.3 33.1*  MCV 98.8 97.8 95.4  PLT 303 275 102    Barber Metabolic Panel: Recent Labs  Lab 03/09/22 1948 03/10/22 0308 03/10/22 0821 03/10/22 1241 03/10/22 1641 03/10/22 2022 03/11/22 0050 03/11/22 0539  NA 136 134* 136 135  --   --   --  133*  K 4.6 6.5* 5.5* 5.5*  5.5* 5.9* 6.3* 5.2* 5.7*  CL 108 106 109 108  --   --   --  102  CO2 16* 17* 16* 15*  --   --   --  15*  GLUCOSE 142* 150* 176* 132*  --   --   --  103*  BUN 37* 48* 55* 61*  --   --   --  79*  CREATININE 2.53* 3.24* 3.48* 3.74*  --   --   --  5.08*  CALCIUM 7.7* 8.0* 7.1* 7.4*  --   --   --  7.6*  MG  --  2.5*  --   --   --   --   --   --   PHOS  --  3.7  --   --   --   --   --   --    GFR: Estimated Creatinine Clearance: 11.3 mL/min (A) (by C-G formula based on SCr of 5.08 mg/dL (H)). Recent Labs  Lab 03/09/22 1350 03/09/22 1407 03/09/22 1752 03/09/22 2204 03/10/22 0049 03/10/22 0308 03/11/22 0539  PROCALCITON  --   --   --  91.61  --   --   --   WBC 29.2*  --   --   --   --  31.8* 25.6*  LATICACIDVEN  --  5.4* 3.3* 1.8 1.8  --   --     Liver Function Tests: Recent Labs  Lab 03/09/22 1350  AST 311*  ALT 122*  ALKPHOS 75  BILITOT 1.4*  PROT 6.3*  ALBUMIN 3.0*   Recent Labs  Lab 03/09/22 2204  LIPASE 88*   No results for input(s): "AMMONIA" in the last 168 hours.  ABG    Component Value Date/Time   PHART 7.23 (L) 03/10/2022 0116    PCO2ART 42 03/10/2022 0116   PO2ART 99 03/10/2022 0116   HCO3 18.0 (L) 03/10/2022 0116   ACIDBASEDEF 9.2 (H) 03/10/2022 0116   O2SAT 95.6 03/10/2022 0116     Coagulation Profile: No results for input(s): "INR", "PROTIME" in the last 168 hours.  Cardiac Enzymes: Recent Labs  Lab 03/10/22 0821  CKTOTAL 144    HbA1C: Hemoglobin A1C  Date/Time Value Ref Range Status  06/29/2020 09:59 AM 5.2 4.0 - 5.6 % Final  05/31/2019 04:44 PM 5.7 (A) 4.0 - 5.6 % Final   Hgb A1c MFr Bld  Date/Time Value Ref Range Status  11/24/2018 10:08 AM 6.0 (H) <5.7 %  of total Hgb Final    Comment:    For someone without known diabetes, a hemoglobin  A1c value between 5.7% and 6.4% is consistent with prediabetes and should be confirmed with a  follow-up test. . For someone with known diabetes, a value <7% indicates that their diabetes is well controlled. A1c targets should be individualized based on duration of diabetes, age, comorbid conditions, and other considerations. . This assay result is consistent with an increased risk of diabetes. . Currently, no consensus exists regarding use of hemoglobin A1c for diagnosis of diabetes for children. [name]   05/22/2018 09:36 AM 7.0 (H) <5.7 % of total Hgb Final    Comment:    For someone without known diabetes, a hemoglobin A1c value of 6.5% or greater indicates that they may have  diabetes and this should be confirmed with a follow-up  test. . For someone with known diabetes, a value <7% indicates  that their diabetes is well controlled and a value  greater than or equal to 7% indicates suboptimal  control. A1c targets should be individualized based on  duration of diabetes, age, comorbid conditions, and  other considerations. . Currently, no consensus exists regarding use of hemoglobin A1c for diagnosis of diabetes for children. .     CBG: Recent Labs  Lab 03/10/22 0628 03/10/22 2012 03/10/22 2349 03/11/22 0428 03/11/22 0758   GLUCAP 224* 95 101* 95 98     Assessment & Plan:   Sepsis, streptococcal bacteremia Acute Diarrhea  Hypotension Plan: De-escalated to ceftriaxone daily Hypotension likely related to worsening acidosis and low-dose Precedex Was started on low-dose Levophed overnight Goal MAP >65   Rash appears to be consistent with hives Plan: Continue steroids for 2 more days Pepcid and Benadryl stopped   AKI likely ATN in the setting of above Anion gap metabolic acidosis with Lactic Acidosis Elevated BUN  Plan: Consult nephrology Was given additional doses of Lokelma Start sodium bicarbonate infusion, 50 cc/h May need dialysis within the next 24 hours. Follow UOP   Transaminitis, shock liver Plan: Supportive care  UDS positive Amphetamines, cannabis, tricyclic's Plan: Supportive care   Best practice:  Diet:  Tube Feed  Pain/Anxiety/Delirium protocol (if indicated): No VAP protocol (if indicated): Not indicated DVT prophylaxis: LMWH GI prophylaxis: PPI Glucose control:  SSI No Central venous access:  Yes, and it is still needed Arterial line:  N/A Foley:  Yes, and it is still needed Mobility:  bed rest  PT consulted: N/A Last date of multidisciplinary goals of care discussion [12/31 , met with family yesterday evening] Code Status:  full code Disposition: ICU  This patient is critically ill with multiple organ system failure; which, requires frequent high complexity decision making, assessment, support, evaluation, and titration of therapies. This was completed through the application of advanced monitoring technologies and extensive interpretation of multiple databases. During this encounter critical care time was devoted to patient care services described in this note for 33 minutes.  Garner Nash, DO [facility] 03/11/2022 9:24 AM

## 2022-03-12 ENCOUNTER — Inpatient Hospital Stay (HOSPITAL_COMMUNITY): Payer: Medicaid Other

## 2022-03-12 DIAGNOSIS — N179 Acute kidney failure, unspecified: Secondary | ICD-10-CM | POA: Diagnosis not present

## 2022-03-12 DIAGNOSIS — A419 Sepsis, unspecified organism: Secondary | ICD-10-CM | POA: Diagnosis not present

## 2022-03-12 DIAGNOSIS — R6521 Severe sepsis with septic shock: Secondary | ICD-10-CM | POA: Diagnosis not present

## 2022-03-12 LAB — RENAL FUNCTION PANEL
Albumin: 2.3 g/dL — ABNORMAL LOW (ref 3.5–5.0)
Albumin: 2.2 g/dL — ABNORMAL LOW (ref 3.5–5.0)
Anion gap: 16 — ABNORMAL HIGH (ref 5–15)
Anion gap: 16 — ABNORMAL HIGH (ref 5–15)
BUN: 103 mg/dL — ABNORMAL HIGH (ref 6–20)
BUN: 95 mg/dL — ABNORMAL HIGH (ref 6–20)
CO2: 20 mmol/L — ABNORMAL LOW (ref 22–32)
CO2: 24 mmol/L (ref 22–32)
Calcium: 7.5 mg/dL — ABNORMAL LOW (ref 8.9–10.3)
Calcium: 7.2 mg/dL — ABNORMAL LOW (ref 8.9–10.3)
Chloride: 96 mmol/L — ABNORMAL LOW (ref 98–111)
Chloride: 94 mmol/L — ABNORMAL LOW (ref 98–111)
Creatinine, Ser: 5.72 mg/dL — ABNORMAL HIGH (ref 0.44–1.00)
Creatinine, Ser: 5.86 mg/dL — ABNORMAL HIGH (ref 0.44–1.00)
GFR, Estimated: 8 mL/min — ABNORMAL LOW (ref >60)
GFR, Estimated: 8 mL/min — ABNORMAL LOW (ref >60)
Glucose, Bld: 125 mg/dL — ABNORMAL HIGH (ref 70–99)
Glucose, Bld: 153 mg/dL — ABNORMAL HIGH (ref 70–99)
Phosphorus: 4.4 mg/dL (ref 2.5–4.6)
Phosphorus: 5.6 mg/dL — ABNORMAL HIGH (ref 2.5–4.6)
Potassium: 5.2 mmol/L — ABNORMAL HIGH (ref 3.5–5.1)
Potassium: 5.2 mmol/L — ABNORMAL HIGH (ref 3.5–5.1)
Sodium: 132 mmol/L — ABNORMAL LOW (ref 135–145)
Sodium: 134 mmol/L — ABNORMAL LOW (ref 135–145)

## 2022-03-12 LAB — BLOOD GAS, ARTERIAL
Acid-base deficit: 7 mmol/L — ABNORMAL HIGH (ref 0.0–2.0)
Acid-base deficit: 6 mmol/L — ABNORMAL HIGH (ref 0.0–2.0)
Acid-base deficit: 5.2 mmol/L — ABNORMAL HIGH (ref 0.0–2.0)
Bicarbonate: 19.7 mmol/L — ABNORMAL LOW (ref 20.0–28.0)
Bicarbonate: 20.7 mmol/L (ref 20.0–28.0)
Bicarbonate: 22.4 mmol/L (ref 20.0–28.0)
Drawn by: 11249
Drawn by: 11249
Drawn by: 331471
FIO2: 100 %
FIO2: 100 %
FIO2: 100 %
MECHVT: 400 mL
MECHVT: 320 mL
O2 Saturation: 97.3 %
O2 Saturation: 97.3 %
O2 Saturation: 97.3 %
PEEP: 5 cmH2O
PEEP: 5 cmH2O
Patient temperature: 37
Patient temperature: 37.3
Patient temperature: 37.1
RATE: 24 resp/min
RATE: 20 resp/min
pCO2 arterial: 43 mmHg (ref 32–48)
pCO2 arterial: 45 mmHg (ref 32–48)
pCO2 arterial: 51 mmHg — ABNORMAL HIGH (ref 32–48)
pH, Arterial: 7.27 — ABNORMAL LOW (ref 7.35–7.45)
pH, Arterial: 7.28 — ABNORMAL LOW (ref 7.35–7.45)
pH, Arterial: 7.25 — ABNORMAL LOW (ref 7.35–7.45)
pO2, Arterial: 147 mmHg — ABNORMAL HIGH (ref 83–108)
pO2, Arterial: 150 mmHg — ABNORMAL HIGH (ref 83–108)
pO2, Arterial: 141 mmHg — ABNORMAL HIGH (ref 83–108)

## 2022-03-12 LAB — CULTURE, BLOOD (ROUTINE X 2): Special Requests: ADEQUATE

## 2022-03-12 LAB — GLUCOSE, CAPILLARY
Glucose-Capillary: 124 mg/dL — ABNORMAL HIGH (ref 70–99)
Glucose-Capillary: 124 mg/dL — ABNORMAL HIGH (ref 70–99)
Glucose-Capillary: 132 mg/dL — ABNORMAL HIGH (ref 70–99)
Glucose-Capillary: 140 mg/dL — ABNORMAL HIGH (ref 70–99)
Glucose-Capillary: 143 mg/dL — ABNORMAL HIGH (ref 70–99)
Glucose-Capillary: 152 mg/dL — ABNORMAL HIGH (ref 70–99)
Glucose-Capillary: 162 mg/dL — ABNORMAL HIGH (ref 70–99)
Glucose-Capillary: 162 mg/dL — ABNORMAL HIGH (ref 70–99)

## 2022-03-12 LAB — VANCOMYCIN, RANDOM: Vancomycin Rm: 34 ug/mL

## 2022-03-12 LAB — HEMOGLOBIN A1C
Hgb A1c MFr Bld: 5.6 % (ref 4.8–5.6)
Mean Plasma Glucose: 114 mg/dL

## 2022-03-12 LAB — MRSA NEXT GEN BY PCR, NASAL: MRSA by PCR Next Gen: NOT DETECTED

## 2022-03-12 LAB — CBC
HCT: 29.6 % — ABNORMAL LOW (ref 36.0–46.0)
Hemoglobin: 10.4 g/dL — ABNORMAL LOW (ref 12.0–15.0)
MCH: 32.5 pg (ref 26.0–34.0)
MCHC: 35.1 g/dL (ref 30.0–36.0)
MCV: 92.5 fL (ref 80.0–100.0)
Platelets: 117 10*3/uL — ABNORMAL LOW (ref 150–400)
RBC: 3.2 MIL/uL — ABNORMAL LOW (ref 3.87–5.11)
RDW: 16 % — ABNORMAL HIGH (ref 11.5–15.5)
WBC: 18 10*3/uL — ABNORMAL HIGH (ref 4.0–10.5)
nRBC: 0.2 % (ref 0.0–0.2)

## 2022-03-12 LAB — PHOSPHORUS
Phosphorus: 4.6 mg/dL (ref 2.5–4.6)
Phosphorus: 5.3 mg/dL — ABNORMAL HIGH (ref 2.5–4.6)

## 2022-03-12 LAB — POTASSIUM: Potassium: 5 mmol/L (ref 3.5–5.1)

## 2022-03-12 LAB — MAGNESIUM
Magnesium: 2.8 mg/dL — ABNORMAL HIGH (ref 1.7–2.4)
Magnesium: 2.4 mg/dL (ref 1.7–2.4)

## 2022-03-12 MED ORDER — VITAL 1.5 CAL PO LIQD
1000.0000 mL | ORAL | Status: DC
  Administered 2022-03-12 – 2022-03-14 (×4): 1000 mL
  Filled 2022-03-12 (×3): qty 1000

## 2022-03-12 MED ORDER — SODIUM CHLORIDE 0.9 % IV SOLN
2.0000 g | Freq: Two times a day (BID) | INTRAVENOUS | Status: DC
  Administered 2022-03-12: 2 g via INTRAVENOUS
  Filled 2022-03-12 (×2): qty 2000

## 2022-03-12 MED ORDER — SODIUM CHLORIDE 0.9% FLUSH
10.0000 mL | Freq: Two times a day (BID) | INTRAVENOUS | Status: DC
  Administered 2022-03-12 – 2022-03-13 (×4): 10 mL
  Administered 2022-03-14: 40 mL
  Administered 2022-03-14: 20 mL
  Administered 2022-03-15 (×2): 10 mL
  Administered 2022-03-16: 40 mL
  Administered 2022-03-16 – 2022-03-17 (×2): 10 mL
  Administered 2022-03-17: 20 mL
  Administered 2022-03-18 – 2022-03-19 (×3): 10 mL
  Administered 2022-03-19: 30 mL
  Administered 2022-03-20: 40 mL
  Administered 2022-03-20 – 2022-03-21 (×3): 10 mL

## 2022-03-12 MED ORDER — SODIUM CHLORIDE 0.9% FLUSH: 10.0000 mL | INTRAVENOUS | Status: DC | PRN

## 2022-03-12 MED ORDER — PANTOPRAZOLE SODIUM 40 MG IV SOLR
40.0000 mg | Freq: Every day | INTRAVENOUS | Status: DC
  Administered 2022-03-12 – 2022-03-27 (×16): 40 mg via INTRAVENOUS
  Filled 2022-03-12 (×16): qty 10

## 2022-03-12 MED ORDER — SODIUM CHLORIDE 0.9 % IV SOLN
2.0000 g | Freq: Four times a day (QID) | INTRAVENOUS | Status: DC
  Administered 2022-03-12 – 2022-03-16 (×15): 2 g via INTRAVENOUS
  Filled 2022-03-12 (×16): qty 2000

## 2022-03-12 MED ORDER — "THROMBI-PAD 3""X3"" EX PADS"
1.0000 | MEDICATED_PAD | Freq: Once | CUTANEOUS | Status: DC
  Filled 2022-03-12 (×2): qty 1

## 2022-03-12 MED ORDER — FENTANYL 2500MCG IN NS 250ML (10MCG/ML) PREMIX INFUSION
0.0000 ug/h | INTRAVENOUS | Status: DC
  Administered 2022-03-12: 25 ug/h via INTRAVENOUS
  Administered 2022-03-13: 150 ug/h via INTRAVENOUS
  Administered 2022-03-13: 175 ug/h via INTRAVENOUS
  Administered 2022-03-14: 225 ug/h via INTRAVENOUS
  Administered 2022-03-14: 200 ug/h via INTRAVENOUS
  Administered 2022-03-14: 225 ug/h via INTRAVENOUS
  Administered 2022-03-15: 275 ug/h via INTRAVENOUS
  Administered 2022-03-15: 200 ug/h via INTRAVENOUS
  Administered 2022-03-16: 300 ug/h via INTRAVENOUS
  Administered 2022-03-16: 250 ug/h via INTRAVENOUS
  Administered 2022-03-16 – 2022-03-17 (×2): 225 ug/h via INTRAVENOUS
  Administered 2022-03-17 – 2022-03-18 (×2): 250 ug/h via INTRAVENOUS
  Administered 2022-03-18: 200 ug/h via INTRAVENOUS
  Administered 2022-03-19: 225 ug/h via INTRAVENOUS
  Administered 2022-03-19: 175 ug/h via INTRAVENOUS
  Administered 2022-03-20: 225 ug/h via INTRAVENOUS
  Administered 2022-03-20 (×2): 250 ug/h via INTRAVENOUS
  Administered 2022-03-21: 225 ug/h via INTRAVENOUS
  Filled 2022-03-12 (×21): qty 250

## 2022-03-12 MED ORDER — SODIUM ZIRCONIUM CYCLOSILICATE 10 G PO PACK
10.0000 g | PACK | Freq: Two times a day (BID) | ORAL | Status: AC
  Administered 2022-03-12: 10 g
  Filled 2022-03-12: qty 1

## 2022-03-12 MED ORDER — PRISMASOL BGK 0/2.5 32-2.5 MEQ/L EC SOLN
Status: DC
  Filled 2022-03-12 (×11): qty 5000

## 2022-03-12 MED ORDER — HEPARIN SODIUM (PORCINE) 1000 UNIT/ML DIALYSIS
1000.0000 [IU] | INTRAMUSCULAR | Status: DC | PRN
  Filled 2022-03-12: qty 6

## 2022-03-12 MED ORDER — MIDAZOLAM HCL 2 MG/2ML IJ SOLN
2.0000 mg | INTRAMUSCULAR | Status: DC | PRN
  Administered 2022-03-13 – 2022-03-22 (×16): 2 mg via INTRAVENOUS
  Filled 2022-03-12 (×17): qty 2

## 2022-03-12 MED ORDER — SODIUM CHLORIDE 0.9 % FOR CRRT: INTRAVENOUS_CENTRAL | Status: DC | PRN

## 2022-03-12 MED ORDER — POLYETHYLENE GLYCOL 3350 17 G PO PACK: 17.0000 g | PACK | Freq: Every day | ORAL | Status: DC | PRN

## 2022-03-12 MED ORDER — FUROSEMIDE 10 MG/ML IJ SOLN
120.0000 mg | Freq: Once | INTRAVENOUS | Status: AC
  Administered 2022-03-12: 120 mg via INTRAVENOUS
  Filled 2022-03-12: qty 10

## 2022-03-12 MED ORDER — PRISMASOL BGK 0/2.5 32-2.5 MEQ/L EC SOLN
Status: DC
  Filled 2022-03-12 (×3): qty 5000

## 2022-03-12 MED ORDER — ORAL CARE MOUTH RINSE
15.0000 mL | OROMUCOSAL | Status: DC
  Administered 2022-03-12 – 2022-03-15 (×29): 15 mL via OROMUCOSAL

## 2022-03-12 MED ORDER — FENTANYL BOLUS VIA INFUSION
50.0000 ug | INTRAVENOUS | Status: DC | PRN
  Administered 2022-03-12 (×5): 50 ug via INTRAVENOUS

## 2022-03-12 MED ORDER — DOCUSATE SODIUM 50 MG/5ML PO LIQD: 100.0000 mg | Freq: Two times a day (BID) | ORAL | Status: DC | PRN

## 2022-03-12 NOTE — Progress Notes (Signed)
Subjective:  soft BP and oliguria continues-  also got intubated -  off and on pressors  Objective Vital signs in last 24 hours: Vitals:   03/12/22 1107 03/12/22 1130 03/12/22 1200 03/12/22 1201  BP:  92/63 102/66   Pulse:  100 (!) 102 (!) 102  Resp:  11 11 13   Temp:  98.8 F (37.1 C) 98.6 F (37 C) 98.6 F (37 C)  TempSrc:      SpO2: 94% 93% 90% 93%  Weight:      Height:       Weight change: 0.1 kg  Intake/Output Summary (Last 24 hours) at 03/12/2022 1205 Last data filed at 03/12/2022 1152 Gross per 24 hour  Intake 3482.39 ml  Output 560 ml  Net 2922.39 ml    Assessment/Plan: [name] with many medical issues-  now presents with AKI in the setting of a strep bacteremia, volume depletion  1.Renal- crt of 0.8 in August of 2023, bland UA in October of 23.  Now with AKI , proteinuria and hematuria but also pyuria.  With common things being common-  this seems to be AKI from ATN in the setting of bacteremia and sepsis- CK normal.  Her active urinary sediment brings to mind other etiologies of AKI-  will check an ANA, ANCA and complements, all pending -  could have a strep related GN?  The treatment would be supportive either way.  Pt did not have any absolute indications for HD but now with continued poor UOP and clinical decline with intubation-   think the best course of action is to go ahead and support her with CRRT  -  CCM and SO are both aware-  orders put in -  no heparin-  2 K baths 2. Hypertension/volume  - is now looking a little more overloaded-  CRRT to keep from getting more positive  3. Hyperkalemia-  due to AKI-  given temporizing measures so far keeping below 6-  bicarb as above-  checking q 4 hours 4. Metabolic acidosis-  bicarb drip initially -  will stop since starting CRRT  5. Anemia  - not a major issue 6. Strep bacteremia-  on rocephin per CCM      Charla Criscione A Merikay Lesniewski    Labs: Basic Metabolic Panel: Recent Labs  Lab 03/11/22 0539 03/11/22 0837  03/11/22 1215 03/11/22 1642 03/11/22 2019 03/12/22 0012 03/12/22 0418  NA 133*  --   --  135  --   --  132*  K 5.7* 5.8*   < > 5.4*  5.3* 5.1 5.0 5.2*  CL 102  --   --  102  --   --  96*  CO2 15*  --   --  16*  --   --  20*  GLUCOSE 103*  --   --  117*  --   --  125*  BUN 79*  --   --  88*  --   --  103*  CREATININE 5.08*  --   --  5.51*  --   --  5.72*  CALCIUM 7.6*  --   --  7.7*  --   --  7.5*  PHOS  --  3.2  --  3.6  3.8  --   --  4.4  4.6   < > = values in this interval not displayed.   Liver Function Tests: Recent Labs  Lab 03/09/22 1350 03/11/22 1642 03/12/22 0418  AST 311*  --   --  ALT 122*  --   --   ALKPHOS 75  --   --   BILITOT 1.4*  --   --   PROT 6.3*  --   --   ALBUMIN 3.0* 2.5* 2.3*   Recent Labs  Lab 03/09/22 2204  LIPASE 88*   No results for input(s): "AMMONIA" in the last 168 hours. CBC: Recent Labs  Lab 03/09/22 1350 03/10/22 0308 03/11/22 0539 03/12/22 0418  WBC 29.2* 31.8* 25.6* 18.0*  NEUTROABS 25.2*  --   --   --   HGB 16.0* 14.6 11.4* 10.4*  HCT 48.7* 44.3 33.1* 29.6*  MCV 98.8 97.8 95.4 92.5  PLT 303 275 156 117*   Cardiac Enzymes: Recent Labs  Lab 03/10/22 0821  CKTOTAL 144   CBG: Recent Labs  Lab 03/11/22 2023 03/12/22 0004 03/12/22 0417 03/12/22 0741 03/12/22 1111  GLUCAP 132* 143* 124* 140* 124*    Iron Studies: No results for input(s): "IRON", "TIBC", "TRANSFERRIN", "FERRITIN" in the last 72 hours. Studies/Results: DG CHEST PORT 1 VIEW  Result Date: 03/11/2022 CLINICAL DATA:  Status post intubation EXAM: PORTABLE CHEST 1 VIEW COMPARISON:  Film from earlier in the same day. FINDINGS: Cardiac shadow is enlarged but stable. Feeding catheter and left jugular central line are again noted and stable. Endotracheal tube is seen 2 cm above the carina. Lungs are well aerated bilaterally. Patchy airspace opacities are seen bilaterally right greater than left. IMPRESSION: Endotracheal tube in satisfactory position. Stable  patchy airspace opacities bilaterally. Electronically Signed   By: Inez Catalina M.D.   On: 03/11/2022 23:58   DG Chest Port 1 View  Result Date: 03/11/2022 CLINICAL DATA:  Hypoxia EXAM: PORTABLE CHEST 1 VIEW COMPARISON:  Film from earlier in the same day. FINDINGS: Cardiac shadow is enlarged. Feeding catheter and left jugular central line are again seen and stable. Patchy opacities are noted in the bases bilaterally as well as the right mid lung new from the prior exam. No bony abnormality is noted. IMPRESSION: New bilateral airspace opacities consistent with multifocal pneumonia. Electronically Signed   By: Inez Catalina M.D.   On: 03/11/2022 21:16   DG CHEST PORT 1 VIEW  Result Date: 03/11/2022 CLINICAL DATA:  Sepsis. EXAM: PORTABLE CHEST 1 VIEW COMPARISON:  03/09/2022 FINDINGS: There is a feeding tube which is looped within the proximal stomach. Left IJ catheter tip is identified at the superior cavoatrial junction. Stable cardiac enlargement. Progressive decreased lung volumes with increased bibasilar atelectasis. Pulmonary vascular congestion. IMPRESSION: 1. Progressive decreased lung volumes with increased bibasilar atelectasis. 2. Pulmonary vascular congestion. Electronically Signed   By: Kerby Moors M.D.   On: 03/11/2022 11:44   Medications: Infusions:  sodium chloride 10 mL/hr at 03/12/22 1145   ampicillin (OMNIPEN) IV Stopped (03/12/22 1142)   fentaNYL infusion INTRAVENOUS 100 mcg/hr (03/12/22 1145)   furosemide 120 mg (03/12/22 1145)   norepinephrine (LEVOPHED) Adult infusion Stopped (03/12/22 0740)   sodium bicarbonate 150 mEq in dextrose 5 % 1,150 mL infusion 100 mL/hr at 03/12/22 1145   sodium chloride      Scheduled Medications:  arformoterol  15 mcg Nebulization BID   budesonide (PULMICORT) nebulizer solution  0.5 mg Nebulization BID   Chlorhexidine Gluconate Cloth  6 each Topical Daily   feeding supplement (PROSource TF20)  60 mL Per Tube Daily   feeding supplement (VITAL  HIGH PROTEIN)  1,000 mL Per Tube Q24H   heparin  5,000 Units Subcutaneous Q8H   methylPREDNISolone (SOLU-MEDROL) injection  40 mg Intravenous  Daily   mouth rinse  15 mL Mouth Rinse 4 times per day   pantoprazole (PROTONIX) IV  40 mg Intravenous Daily   revefenacin  175 mcg Nebulization Daily   sodium chloride flush  10-40 mL Intracatheter Q12H   sodium zirconium cyclosilicate  10 g Per Tube BID    have reviewed scheduled and prn medications.  Physical Exam: General: sedated on vent Heart:tachy Lungs: CBS bilat Abdomen: obese, soft, non tender Extremities: min dep edema Dialysis Access: none yet     03/12/2022,12:05 PM  LOS: 2 days

## 2022-03-12 NOTE — Progress Notes (Addendum)
Patient intubated  @ 2332 by MD Elsworth Soho for because of patient previous desaturation. Patient was given 1mg  versed, 100 fent, and 20 of etomidate previous to intubation.

## 2022-03-12 NOTE — Progress Notes (Signed)
[facility] Progress Note Patient Name: Darlene Barber DOB: 09-07-1965 MRN: [id number]   Date of Service  03/12/2022  HPI/Events of Note  ABG 7.28/45/150 relatively unchanged Remains synchronous on vent on pressure support, MV 7 Appears adequately sedated  eICU Interventions  Will maintain current vent setting     Intervention Category Intermediate Interventions: Diagnostic test evaluation  Judd Lien 03/12/2022, 5:20 AM

## 2022-03-12 NOTE — Progress Notes (Signed)
Initial Nutrition Assessment  DOCUMENTATION CODES:   Obesity unspecified  INTERVENTION:  - Trickle TF only via OG (tip in stomach) at this time per CCM.  Per discussion with MD can change to Vital 1.5: Vital 1.5 at 23mL/hr = 800 kcals, 52g protein, 364mL free water from formula  - Monitor magnesium, potassium, and phosphorus BID for at least 3 days, MD to replete as needed.  - Once able to advance past trickles, recommend below TF regimen:  Tube feeding via OG: Vital 1.5 at 40 ml/h (960 ml per day) *Once able to advance,  start at 62mL/hr and advance by 52mL Q8H  Prosource TF20 60 ml daily Provides 1520 kcal, 85 gm protein, 733 ml free water daily - Free water flushes per MD   NUTRITION DIAGNOSIS:   Inadequate oral intake related to inability to eat as evidenced by NPO status (on vent).  GOAL:   Patient will meet greater than or equal to 90% of their needs  MONITOR:   Vent status, TF tolerance, Labs, Weight trends  REASON FOR ASSESSMENT:   Consult Enteral/tube feeding initiation and management (trickle TF with recs)  ASSESSMENT:   57 yo female w/ pertinent PMH HTN, T2DM, OSA, urge incontinence, bipolar depression presents to ED on 12/30 w/ rash and sob. Found to have sepsis and acute hypoxemic respiratory failure.   12/30 Admit 1/1: intubated, Vital HP at 68mL/hr started  Patient intubated and sedated at time of visit. TF of Vital HP running at 54mL/hr, in addition to x1 PS this provides 560 kcals and 62g protein over 24 hours.  Per CCM only trickle feeds at this time. Discussed change to Vital 1.5.  Per chart review patient has had weight gain over the past 6 months. History of diabetes but HA1C 5.6 this admission.   Nephrology note today indicates plan to start CRRT.   Medications reviewed and include: Lasix Fentanyl Levophed  Labs reviewed:  Na 132 K+ 5.2 Creatinine 5.72 Magnesium 2.8 HA1C 5.6 (03/10/22)   NUTRITION - FOCUSED PHYSICAL  EXAM:  Flowsheet Row Most Recent Value  Orbital Region Mild depletion  Upper Arm Region No depletion  Thoracic and Lumbar Region No depletion  Buccal Region Unable to assess  Temple Region Mild depletion  Clavicle Bone Region No depletion  Clavicle and Acromion Bone Region No depletion  Scapular Bone Region Unable to assess  Dorsal Hand No depletion  Patellar Region Mild depletion  Anterior Thigh Region Mild depletion  Posterior Calf Region Mild depletion  Edema (RD Assessment) Mild  Hair Reviewed  Eyes Unable to assess  Mouth Unable to assess  Skin Reviewed  Nails Reviewed       Diet Order:   Diet Order             Diet NPO time specified  Diet effective now                   EDUCATION NEEDS:  Not appropriate for education at this time  Skin:  Skin Assessment: Skin Integrity Issues: Skin Integrity Issues:: Other (Comment) Other: skin tears L knee  Last BM:  1/2  Height:  Ht Readings from Last 1 Encounters:  03/12/22 4\' 10"  (1.473 m)   Weight:  Wt Readings from Last 1 Encounters:  03/10/22 80.6 kg   Ideal Body Weight:  43.9 kg  BMI:  Body mass index is 37.15 kg/m.  Estimated Nutritional Needs:  Kcal:  1500-1700 kcals Protein:  80-90 grams Fluid:  >/= 1.5L  Samson Frederic RD, LDN For contact information, refer to [facility].

## 2022-03-12 NOTE — Progress Notes (Signed)
Collected ordered sputum- sent to Lab for analysis.

## 2022-03-12 NOTE — Procedures (Signed)
Central Venous Catheter Insertion Procedure Note  Darlene Barber  [id number]  June 19, 1965  Date:03/12/22  Time:3:07 PM   Provider Performing:Deshundra Waller Shearon Stalls   Procedure: Insertion of Non-tunneled Central Venous Catheter(36556)with US guidance (33354)    Indication(s) Hemodialysis  Consent Risks of the procedure as well as the alternatives and risks of each were explained to the patient and/or caregiver.  Consent for the procedure was obtained and is signed in the bedside chart  Anesthesia Topical only with 1% lidocaine   Timeout Verified patient identification, verified procedure, site/side was marked, verified correct patient position, special equipment/implants available, medications/allergies/relevant history reviewed, required imaging and test results available.  Sterile Technique Maximal sterile technique including full sterile barrier drape, hand hygiene, sterile gown, sterile gloves, mask, hair covering, sterile ultrasound probe cover (if used).  Procedure Description Area of catheter insertion was cleaned with chlorhexidine and draped in sterile fashion.   With real-time ultrasound guidance a HD catheter was placed into the right femoral vein.  Nonpulsatile blood flow and easy flushing noted in all ports.  The catheter was sutured in place and sterile dressing applied.  Complications/Tolerance None; patient tolerated the procedure well. Chest X-ray is ordered to verify placement for internal jugular or subclavian cannulation.  Chest x-ray is not ordered for femoral cannulation.  EBL Minimal  Specimen(s) None   Montey Hora, PA - C  Pulmonary & Critical Care Medicine For pager details, please see AMION or use Epic chat  After 1900, please call [facility] for cross coverage needs 03/12/2022, 3:07 PM

## 2022-03-12 NOTE — Progress Notes (Signed)
PHARMACY NOTE:  ANTIMICROBIAL RENAL DOSAGE ADJUSTMENT  Current antimicrobial regimen includes a mismatch between antimicrobial dosage and estimated renal function. As per policy approved by the Pharmacy & Therapeutics and Medical Executive Committees, the antimicrobial dosage will be adjusted accordingly.  Current antimicrobial and dosage:  ampicillin 2g IV q12 hr based on CrCl ~10 ml/min  Indication: strep bacteremia  Renal Function:   Estimated Creatinine Clearance: 10.1 mL/min (A) (by C-G formula based on SCr of 5.72 mg/dL (H)). []      On intermittent HD, scheduled: [x]      On CRRT    Antimicrobial dosage has been changed to:  ampicillin 2g IV q6 hr   Additional Comments: will adjust once CRRT commences   Thank you for allowing pharmacy to be a part of this patient's care.  Reuel Boom, PharmD, BCPS [phone] 03/12/2022, 12:32 PM

## 2022-03-12 NOTE — Progress Notes (Signed)
Consent for HD catheter insertion obtained by CCM. Patient tolerated catheter insertion well, without noted complication. CRRT was initiated at 1616 per orders. This RN will continue to carefully monitor patient's hemodynamic status.

## 2022-03-12 NOTE — Plan of Care (Signed)
  Problem: Coping: Goal: Level of anxiety will decrease Outcome: Progressing   Problem: Elimination: Goal: Will not experience complications related to bowel motility Outcome: Progressing   Problem: Pain Managment: Goal: General experience of comfort will improve Outcome: Progressing   Problem: Education: Goal: Knowledge of General Education information will improve Description: Including pain rating scale, medication(s)/side effects and non-pharmacologic comfort measures Outcome: Not Progressing   Problem: Health Behavior/Discharge Planning: Goal: Ability to manage health-related needs will improve Outcome: Not Progressing   Problem: Clinical Measurements: Goal: Ability to maintain clinical measurements within normal limits will improve Outcome: Not Progressing Goal: Will remain free from infection Outcome: Not Progressing Goal: Diagnostic test results will improve Outcome: Not Progressing Goal: Respiratory complications will improve Outcome: Not Progressing Goal: Cardiovascular complication will be avoided Outcome: Not Progressing   Problem: Activity: Goal: Risk for activity intolerance will decrease Outcome: Not Progressing   Problem: Nutrition: Goal: Adequate nutrition will be maintained Outcome: Not Progressing   Problem: Elimination: Goal: Will not experience complications related to urinary retention Outcome: Not Progressing   Problem: Safety: Goal: Ability to remain free from injury will improve Outcome: Not Progressing   Problem: Skin Integrity: Goal: Risk for impaired skin integrity will decrease Outcome: Not Progressing   Problem: Education: Goal: Ability to describe self-care measures that may prevent or decrease complications (Diabetes Survival Skills Education) will improve Outcome: Not Progressing Goal: Individualized Educational Video(s) Outcome: Not Progressing   Problem: Coping: Goal: Ability to adjust to condition or change in health will  improve Outcome: Not Progressing   Problem: Fluid Volume: Goal: Ability to maintain a balanced intake and output will improve Outcome: Not Progressing   Problem: Health Behavior/Discharge Planning: Goal: Ability to identify and utilize available resources and services will improve Outcome: Not Progressing Goal: Ability to manage health-related needs will improve Outcome: Not Progressing   Problem: Metabolic: Goal: Ability to maintain appropriate glucose levels will improve Outcome: Not Progressing   Problem: Nutritional: Goal: Maintenance of adequate nutrition will improve Outcome: Not Progressing Goal: Progress toward achieving an optimal weight will improve Outcome: Not Progressing   Problem: Skin Integrity: Goal: Risk for impaired skin integrity will decrease Outcome: Not Progressing   Problem: Tissue Perfusion: Goal: Adequacy of tissue perfusion will improve Outcome: Not Progressing   Problem: Safety: Goal: Non-violent Restraint(s) Outcome: Not Progressing   Problem: Activity: Goal: Ability to tolerate increased activity will improve Outcome: Not Progressing   Problem: Respiratory: Goal: Ability to maintain a clear airway and adequate ventilation will improve Outcome: Not Progressing   Problem: Role Relationship: Goal: Method of communication will improve Outcome: Not Progressing

## 2022-03-12 NOTE — Progress Notes (Signed)
[place] Progress Note Patient Name: Darlene Barber DOB: 07/19/1965 MRN: [id number]   Date of Service  03/12/2022  HPI/Events of Note  Received request for renewal of restraints Patient seen intubated and a risk for self harm by pulling lines and tubes  eICU Interventions  Bilateral soft wrist restraints renewed Bedside team to assess in am if restraints to be continued     Intervention Category Intermediate Interventions: Other:  Judd Lien 03/12/2022, 3:10 AM

## 2022-03-12 NOTE — Progress Notes (Addendum)
Patient had a BM and became very agitated, when this RN came in room patient O2 sats were mid 80's, increased her HFNC to 15L to support increasing O2 demands. Patient still satting high 80's, nonrebreather added at 15L overtop of HFNC. Patient sats remaining low 90's, respiratory and CCM contacted.   Gave patient 1mg  of Ativan for agitation. Patient calmed down, but sats not recovering well. MD placed order for HHFNC and precedex.

## 2022-03-12 NOTE — Progress Notes (Addendum)
[place] Progress Note Patient Name: ALEXYA MCDARIS DOB: [birth year] MRN: [id number]   Date of Service  03/12/2022  HPI/Events of Note  ABG 7.27/43/147 On AC 24/400/100%/5 PEEP, MV 9 With vent desynchrony but not overtly agitated Sedated on Precedex 0.9 and received Fentanyl 50 mcg an hour earlier  eICU Interventions  Will discuss with RT to see if vent adjustment can be done for better synchrony and hopefully avoid overly sedating  Still with desynchrony on pressure AC Better on pressure support 5/5, MV maintained at 9. Back up ventilation is set. Discussed with RT who was at bedside.     Intervention Category Intermediate Interventions: Diagnostic test evaluation  Judd Lien 03/12/2022, 1:16 AM

## 2022-03-12 NOTE — Progress Notes (Signed)
NAME:  Darlene Barber, MRN:  [id number], DOB:  May 02, 1965, LOS: 2 ADMISSION DATE:  03/10/2022, CONSULTATION DATE:  03/09/2022 REFERRING MD:  Conni Slipper  CHIEF COMPLAINT:  Abdominal Pain   HPI   57  y.o with significant PMH of chronic pain syndrome, cervical spondylitis, GERD, Bipolar Disorder, current everyday smoker and Marijuana use  who presented to the ED with chief complaints of abdominal pain and widespread generalized rash.  Per patient's significant other who is currently at the bedside, symptoms developed a day after patient ate at a local restaurant in [place]. [name] significant other report that patient was up all night with crampy abdominal pain associated with nausea and vomiting. Today at around 2 pm, they noticed a widespread rash all her her body including her chin area with now worsening pain prompting him to call EMS.    ED Course: Initial vital signs showed HR of 58 beats/minute, BP mm 133/38 Hg, the RR 22 , and the oxygen saturation 87% on 2 L and a temperature of 97.91F (36.4C).  Pertinent labs revealed elevated white count  WBC 29.2, electrolytes (sodium 140 mmol/L, potassium 4.7?mmol/L, there was acute kidney injury (urea 13.4?mmol/L, BUN/creatinine 34./2.64, eGFR 21, anion gap metabolic acidosis, and liver function tests showed a mixed hepatitic and cholestatic picture (Bilirubin 1.4, AST 311?U/L, ALT 122 U/L, ALP 75 U/L, and albumin 3.0). . Stool samples were sent as work up of acute diarrhoea and campylobacter species was isolated. Stool pathogen panel negative. Clostridium difficile toxin was negative. Patient given 30 cc/kg of fluids and started on broad-spectrum antibiotics Vanco cefepime and Flagyl for  suspected sepsis of intraabdominal source.  Patient remained hypotensive despite IVF boluses therefore was started on Levophed.  PCCM consulted to assist with management pending transfer to [facility] as there were no beds at [facility].  Past Medical History  chronic pain  syndrome, cervical spondylitis, GERD, Bipolar Disorder, current everyday smoker and Marijuana use  Significant Hospital Events   12/30: admit to [facility] with acute diarrhea, PCCM consulted for management pending transfer  Consults:  GI  Procedures:  12/30: Left IJ Central Line  Significant Diagnostic Tests:  12/30: Chest Xray>Cardiomegaly. Increased interstitial markings are seen in parahilar regions and lower lung fields suggesting possible mild interstitial edema. There are linear densities in left lower lung fields suggesting subsegmental atelectasis. Possible minimal bilateral pleural effusions. 12/30: CT Chest abdomen and pelvis>1. Fluid throughout the stomach, small bowel and colon, nonspecific but may represent a diarrheal state. No bowel obstruction, definite bowel wall thickening or pneumoperitoneum. 2. Fluid within the esophagus which may represent reflux or nonspecific esophageal motility disorder. 3. Trace amount of free pelvic fluid, nonspecific. 4. Punctate nonobstructing bilateral renal calculi. 5.  Aortic Atherosclerosis (ICD10-I70.0).  Micro Data:  12/30: SARS-CoV-2 PCR> negative 12/30: Influenza PCR> negative 12/30: Blood culture x2> 12/30: Urine Culture> 12/30: MRSA PCR>>  12/31 BCX + Strep   Antimicrobials:  Vancomycin 12/30 x 1 Cefepime 12/30 x 1 Metronidazole 12/30 x 1 Zosyn 12/30>  OBJECTIVE  Blood pressure 115/70, pulse (!) 112, temperature 98.8 F (37.1 C), resp. rate 17, height _0  (1.473 m), weight 83.9 kg, SpO2 93 %.    Vent Mode: PRVC FiO2 (%):  [100 %] 100 % Set Rate:  [20 bmp-24 bmp] 20 bmp Vt Set:  [320 mL-400 mL] 320 mL PEEP:  [5 cmH20] 5 cmH20 Pressure Support:  [5 cmH20] 5 cmH20   Intake/Output Summary (Last 24 hours) at 03/12/2022 0947 Last data filed at 03/12/2022 [phone]  Gross per 24 hour  Intake 3445.41 ml  Output 490 ml  Net 2955.41 ml     Filed Weights   03/10/22 0242 03/11/22 0500 03/12/22 0424  Weight: 80.6 kg 83.8 kg 83.9 kg    Physical Examination   GENERAL: 57 year old female, chronically ill-appearing, intubated and sedated  HEENT: Nasogastric tube in place, NCAT LUNGS: coars eventilated sounds CARDIOVASCULAR: Regular rate, S1-S2 ABDOMEN: Tenderness to palpation, mildly distended EXTREMITIES: edematous NEUROLOGIC: sedated, does not follow commands SKIN: no rash seen on chest    Labs   CBC: Recent Labs  Lab 03/09/22 1350 03/10/22 0308 03/11/22 0539 03/12/22 0418  WBC 29.2* 31.8* 25.6* 18.0*  NEUTROABS 25.2*  --   --   --   HGB 16.0* 14.6 11.4* 10.4*  HCT 48.7* 44.3 33.1* 29.6*  MCV 98.8 97.8 95.4 92.5  PLT 303 275 156 117*     Basic Metabolic Panel: Recent Labs  Lab 03/10/22 0308 03/10/22 0821 03/10/22 1241 03/10/22 1641 03/11/22 0539 03/11/22 0837 03/11/22 1215 03/11/22 1642 03/11/22 2019 03/12/22 0012 03/12/22 0418  NA 134* 136 135  --  133*  --   --  135  --   --  132*  K 6.5* 5.5* 5.5*  5.5*   < > 5.7* 5.8* 5.5* 5.4*  5.3* 5.1 5.0 5.2*  CL 106 109 108  --  102  --   --  102  --   --  96*  CO2 17* 16* 15*  --  15*  --   --  16*  --   --  20*  GLUCOSE 150* 176* 132*  --  103*  --   --  117*  --   --  125*  BUN 48* 55* 61*  --  79*  --   --  88*  --   --  103*  CREATININE 3.24* 3.48* 3.74*  --  5.08*  --   --  5.51*  --   --  5.72*  CALCIUM 8.0* 7.1* 7.4*  --  7.6*  --   --  7.7*  --   --  7.5*  MG 2.5*  --   --   --   --  2.4  --  2.4  --   --  2.8*  PHOS 3.7  --   --   --   --  3.2  --  3.6  3.8  --   --  4.4  4.6   < > = values in this interval not displayed.    GFR: Estimated Creatinine Clearance: 10.1 mL/min (A) (by C-G formula based on SCr of 5.72 mg/dL (H)). Recent Labs  Lab 03/09/22 1350 03/09/22 1407 03/09/22 1752 03/09/22 2204 03/10/22 0049 03/10/22 0308 03/11/22 0539 03/12/22 0418  PROCALCITON  --   --   --  91.61  --   --   --   --   WBC 29.2*  --   --   --   --  31.8* 25.6* 18.0*  LATICACIDVEN  --  5.4* 3.3* 1.8 1.8  --   --   --      Liver  Function Tests: Recent Labs  Lab 03/09/22 1350 03/11/22 1642 03/12/22 0418  AST 311*  --   --   ALT 122*  --   --   ALKPHOS 75  --   --   BILITOT 1.4*  --   --   PROT 6.3*  --   --   ALBUMIN 3.0* 2.5* 2.3*  Recent Labs  Lab 03/09/22 2204  LIPASE 88*    No results for input(s): "AMMONIA" in the last 168 hours.  ABG    Component Value Date/Time   PHART 7.28 (L) 03/12/2022 0435   PCO2ART 45 03/12/2022 0435   PO2ART 150 (H) 03/12/2022 0435   HCO3 20.7 03/12/2022 0435   ACIDBASEDEF 6.0 (H) 03/12/2022 0435   O2SAT 97.3 03/12/2022 0435     Coagulation Profile: No results for input(s): "INR", "PROTIME" in the last 168 hours.  Cardiac Enzymes: Recent Labs  Lab 03/10/22 0821  CKTOTAL 144     HbA1C: Hemoglobin A1C  Date/Time Value Ref Range Status  06/29/2020 09:59 AM 5.2 4.0 - 5.6 % Final  05/31/2019 04:44 PM 5.7 (A) 4.0 - 5.6 % Final   Hgb A1c MFr Bld  Date/Time Value Ref Range Status  11/24/2018 10:08 AM 6.0 (H) <5.7 % of total Hgb Final    Comment:    For someone without known diabetes, a hemoglobin  A1c value between 5.7% and 6.4% is consistent with prediabetes and should be confirmed with a  follow-up test. . For someone with known diabetes, a value <7% indicates that their diabetes is well controlled. A1c targets should be individualized based on duration of diabetes, age, comorbid conditions, and other considerations. . This assay result is consistent with an increased risk of diabetes. . Currently, no consensus exists regarding use of hemoglobin A1c for diagnosis of diabetes for children. [name]   05/22/2018 09:36 AM 7.0 (H) <5.7 % of total Hgb Final    Comment:    For someone without known diabetes, a hemoglobin A1c value of 6.5% or greater indicates that they may have  diabetes and this should be confirmed with a follow-up  test. . For someone with known diabetes, a value <7% indicates  that their diabetes is well controlled and a value   greater than or equal to 7% indicates suboptimal  control. A1c targets should be individualized based on  duration of diabetes, age, comorbid conditions, and  other considerations. . Currently, no consensus exists regarding use of hemoglobin A1c for diagnosis of diabetes for children. .     CBG: Recent Labs  Lab 03/11/22 1532 03/11/22 2023 03/12/22 0004 03/12/22 0417 03/12/22 0741  GLUCAP 106* 132* 143* 124* 140*      Assessment & Plan:   Sepsis, streptococcal bacteremia Acute Diarrhea  Hypotension Plan: De-escalate to ampicillin based on sensitivities 1/2 Hypotension likely related to worsening acidosis and sedation Norepinephrine, Goal MAP >65   Acute hypoxemic respiratory failure: Suspect related to acidosis, volume overload based on worsening chest x-ray with relatively clear chest x-ray on admission. -- Transition to [facility], getting large tidal volumes on pressure support 5/5 100 and FiO2 does not seem appropriate, RASS goal -2, add fentanyl drip, DC Precedex, consider addition of propofol if needed, do synchronous with ventilator overnight and this morning -- Diuresis trial as below, anticipate starting CRRT later today  Rash appears to be consistent with hives Plan: Continue steroids, end date placed Pepcid and Benadryl stopped   Oliguric Renal Failure, likely ATN in the setting of above Anion gap metabolic acidosis with Lactic Acidosis Elevated BUN  Plan: Appreciate nephrology assitance Lokelma Continue sodium bicarbonate infusion per nephro Lasix challenge 1/2 Plan for CRRT later today if no urine output Follow UOP   Transaminitis, shock liver Plan: Supportive care  UDS positive Amphetamines, cannabis, tricyclic's Plan: Supportive care   Best practice:  Diet:  Tube Feed  Pain/Anxiety/Delirium protocol (  if indicated): No VAP protocol (if indicated): Not indicated DVT prophylaxis: LMWH GI prophylaxis: PPI Glucose control:  SSI No Central  venous access:  Yes, and it is still needed Arterial line:  N/A Foley:  Yes, and it is still needed Mobility:  bed rest  PT consulted: N/A Last date of multidisciplinary goals of care discussion [12/31 ] Code Status:  full code Disposition: ICU  CRITICAL CARE Performed by: Bonna Gains Kamau Weatherall   Total critical care time: 40 minutes  Critical care time was exclusive of separately billable procedures and treating other patients.  Critical care was necessary to treat or prevent imminent or life-threatening deterioration.  Critical care was time spent personally by me on the following activities: development of treatment plan with patient and/or surrogate as well as nursing, discussions with consultants, evaluation of patient's response to treatment, examination of patient, obtaining history from patient or surrogate, ordering and performing treatments and interventions, ordering and review of laboratory studies, ordering and review of radiographic studies, pulse oximetry and re-evaluation of patient's condition.   Lanier Clam, MD [place] [facility] 03/12/2022 9:47 AM

## 2022-03-13 ENCOUNTER — Inpatient Hospital Stay (HOSPITAL_COMMUNITY): Payer: Medicaid Other

## 2022-03-13 DIAGNOSIS — A419 Sepsis, unspecified organism: Secondary | ICD-10-CM | POA: Diagnosis not present

## 2022-03-13 DIAGNOSIS — R6521 Severe sepsis with septic shock: Secondary | ICD-10-CM | POA: Diagnosis not present

## 2022-03-13 DIAGNOSIS — N179 Acute kidney failure, unspecified: Secondary | ICD-10-CM | POA: Diagnosis not present

## 2022-03-13 LAB — RENAL FUNCTION PANEL
Albumin: 2.1 g/dL — ABNORMAL LOW (ref 3.5–5.0)
Albumin: 2.2 g/dL — ABNORMAL LOW (ref 3.5–5.0)
Albumin: 2.1 g/dL — ABNORMAL LOW (ref 3.5–5.0)
Anion gap: 11 (ref 5–15)
Anion gap: 9 (ref 5–15)
Anion gap: 8 (ref 5–15)
BUN: 61 mg/dL — ABNORMAL HIGH (ref 6–20)
BUN: 53 mg/dL — ABNORMAL HIGH (ref 6–20)
BUN: 44 mg/dL — ABNORMAL HIGH (ref 6–20)
CO2: 26 mmol/L (ref 22–32)
CO2: 27 mmol/L (ref 22–32)
CO2: 27 mmol/L (ref 22–32)
Calcium: 7.1 mg/dL — ABNORMAL LOW (ref 8.9–10.3)
Calcium: 7.8 mg/dL — ABNORMAL LOW (ref 8.9–10.3)
Calcium: 7.6 mg/dL — ABNORMAL LOW (ref 8.9–10.3)
Chloride: 96 mmol/L — ABNORMAL LOW (ref 98–111)
Chloride: 100 mmol/L (ref 98–111)
Chloride: 100 mmol/L (ref 98–111)
Creatinine, Ser: 3.67 mg/dL — ABNORMAL HIGH (ref 0.44–1.00)
Creatinine, Ser: 3.21 mg/dL — ABNORMAL HIGH (ref 0.44–1.00)
Creatinine, Ser: 2.61 mg/dL — ABNORMAL HIGH (ref 0.44–1.00)
GFR, Estimated: 14 mL/min — ABNORMAL LOW (ref >60)
GFR, Estimated: 16 mL/min — ABNORMAL LOW (ref >60)
GFR, Estimated: 21 mL/min — ABNORMAL LOW (ref >60)
Glucose, Bld: 134 mg/dL — ABNORMAL HIGH (ref 70–99)
Glucose, Bld: 138 mg/dL — ABNORMAL HIGH (ref 70–99)
Glucose, Bld: 171 mg/dL — ABNORMAL HIGH (ref 70–99)
Phosphorus: 4.5 mg/dL (ref 2.5–4.6)
Phosphorus: 4.2 mg/dL (ref 2.5–4.6)
Phosphorus: 4.1 mg/dL (ref 2.5–4.6)
Potassium: 3.9 mmol/L (ref 3.5–5.1)
Potassium: 4.2 mmol/L (ref 3.5–5.1)
Potassium: 4.3 mmol/L (ref 3.5–5.1)
Sodium: 133 mmol/L — ABNORMAL LOW (ref 135–145)
Sodium: 136 mmol/L (ref 135–145)
Sodium: 135 mmol/L (ref 135–145)

## 2022-03-13 LAB — GLUCOSE, CAPILLARY
Glucose-Capillary: 117 mg/dL — ABNORMAL HIGH (ref 70–99)
Glucose-Capillary: 119 mg/dL — ABNORMAL HIGH (ref 70–99)
Glucose-Capillary: 146 mg/dL — ABNORMAL HIGH (ref 70–99)
Glucose-Capillary: 167 mg/dL — ABNORMAL HIGH (ref 70–99)
Glucose-Capillary: 166 mg/dL — ABNORMAL HIGH (ref 70–99)

## 2022-03-13 LAB — DIC (DISSEMINATED INTRAVASCULAR COAGULATION)PANEL
D-Dimer, Quant: 4.61 ug/mL-FEU — ABNORMAL HIGH (ref 0.00–0.50)
Fibrinogen: 724 mg/dL — ABNORMAL HIGH (ref 210–475)
INR: 1.1 (ref 0.8–1.2)
Platelets: 99 10*3/uL — ABNORMAL LOW (ref 150–400)
Prothrombin Time: 14.5 seconds (ref 11.4–15.2)
Smear Review: NONE SEEN
aPTT: 46 seconds — ABNORMAL HIGH (ref 24–36)

## 2022-03-13 LAB — ANA: Anti Nuclear Antibody (ANA): NEGATIVE

## 2022-03-13 LAB — ANCA TITERS
Atypical P-ANCA titer: <1:20 {titer}
C-ANCA: <1:20 {titer}
P-ANCA: <1:20 {titer}

## 2022-03-13 LAB — C3 COMPLEMENT: C3 Complement: 74 mg/dL — ABNORMAL LOW (ref 82–167)

## 2022-03-13 LAB — MAGNESIUM: Magnesium: 2.3 mg/dL (ref 1.7–2.4)

## 2022-03-13 LAB — C4 COMPLEMENT: Complement C4, Body Fluid: 22 mg/dL (ref 12–38)

## 2022-03-13 MED ORDER — PREGABALIN 100 MG PO CAPS: 100.0000 mg | ORAL_CAPSULE | Freq: Two times a day (BID) | ORAL | Status: DC

## 2022-03-13 MED ORDER — QUETIAPINE FUMARATE 100 MG PO TABS
100.0000 mg | ORAL_TABLET | Freq: Every day | ORAL | Status: DC
  Administered 2022-03-13 – 2022-03-22 (×10): 100 mg
  Filled 2022-03-13 (×10): qty 1

## 2022-03-13 MED ORDER — PRISMASOL BGK 4/2.5 32-4-2.5 MEQ/L EC SOLN: Status: DC

## 2022-03-13 MED ORDER — FENTANYL BOLUS VIA INFUSION
50.0000 ug | INTRAVENOUS | Status: DC | PRN
  Administered 2022-03-13 (×2): 100 ug via INTRAVENOUS
  Administered 2022-03-13: 50 ug via INTRAVENOUS
  Administered 2022-03-13 – 2022-03-15 (×16): 100 ug via INTRAVENOUS
  Administered 2022-03-15 – 2022-03-16 (×3): 50 ug via INTRAVENOUS
  Administered 2022-03-16: 100 ug via INTRAVENOUS
  Administered 2022-03-16 (×2): 50 ug via INTRAVENOUS
  Administered 2022-03-16 (×2): 100 ug via INTRAVENOUS
  Administered 2022-03-16 (×2): 50 ug via INTRAVENOUS
  Administered 2022-03-17 – 2022-03-19 (×10): 100 ug via INTRAVENOUS
  Administered 2022-03-19: 50 ug via INTRAVENOUS
  Administered 2022-03-20 – 2022-03-21 (×9): 100 ug via INTRAVENOUS

## 2022-03-13 MED ORDER — PRISMASOL BGK 4/2.5 32-4-2.5 MEQ/L REPLACEMENT SOLN: Status: DC

## 2022-03-13 MED ORDER — VILAZODONE HCL 20 MG PO TABS
40.0000 mg | ORAL_TABLET | Freq: Every day | ORAL | Status: DC
  Administered 2022-03-13 – 2022-03-28 (×16): 40 mg
  Filled 2022-03-13 (×17): qty 2

## 2022-03-13 NOTE — Progress Notes (Addendum)
NAME:  Darlene Barber, MRN:  [id number], DOB:  11/14/1965, LOS: 3 ADMISSION DATE:  03/10/2022, CONSULTATION DATE:  03/09/2022 REFERRING MD:  Conni Slipper  CHIEF COMPLAINT:  Abdominal Pain   HPI  57  y.o with significant PMH of chronic pain syndrome, cervical spondylitis, GERD, Bipolar Disorder, current everyday smoker and Marijuana use who presented to the ED with chief complaints of abdominal pain and widespread generalized rash.  Per patient's significant other who is currently at the bedside, symptoms developed a day after patient ate at a local restaurant in [place]. [name] significant other report that patient was up all night with crampy abdominal pain associated with nausea and vomiting. Today at around 2 pm, they noticed a widespread rash all her her body including her chin area with now worsening pain prompting him to call EMS.    ED Course: Initial vital signs showed HR of 58 beats/minute, BP mm 133/38 Hg, the RR 22 , and the oxygen saturation 87% on 2 L and a temperature of 97.42F (36.4C).  Pertinent labs revealed elevated white count  WBC 29.2, electrolytes (sodium 140 mmol/L, potassium 4.7?mmol/L, there was acute kidney injury (urea 13.4?mmol/L, BUN/creatinine 34./2.64, eGFR 21, anion gap metabolic acidosis, and liver function tests showed a mixed hepatitic and cholestatic picture (Bilirubin 1.4, AST 311?U/L, ALT 122 U/L, ALP 75 U/L, and albumin 3.0). . Stool samples were sent as work up of acute diarrhoea and campylobacter species was isolated. Stool pathogen panel negative. Clostridium difficile toxin was negative. Patient given 30 cc/kg of fluids and started on broad-spectrum antibiotics Vanco cefepime and Flagyl for  suspected sepsis of intraabdominal source.  Patient remained hypotensive despite IVF boluses therefore was started on Levophed.    PCCM consulted to assist with management pending transfer to [facility] as there were no beds at [facility].  Past Medical History  Chronic pain  syndrome, cervical spondylitis, GERD, Bipolar Disorder, current everyday smoker and Marijuana use  Significant Hospital Events   12/30 Admit to [facility] with acute diarrhea, PCCM consulted for management pending transfer. L IJ TLC placement. CT Chest/ABD/Pelvis with fluid throughout the stomach, small bowel & colon, nonspecific but may represent diarrheal state. No bowel obstruction, definite bowel wall thickening or pneumoperitoneum. Fluid within the esophagus may represent reflux or non-specific esophageal motility disorder. Trace amt free pelvic fluid, non-specific. UDS + Tricyclic, amphetamines, THC. C-Diff negative. GI PCR negative. COVID/flu/RSV negative. ABX narrowed to Zosyn.  12/31 BC > positive for strep infantarius  1/3 On CRRT, vent 60%/PEEP 5. Off vasopressors.    OBJECTIVE  Blood pressure 116/66, pulse (!) 109, temperature 99.1 F (37.3 C), resp. rate 20, height _0  (1.473 m), weight 83.9 kg, SpO2 94 %.    Vent Mode: SIMV;PSV;Volume support FiO2 (%):  [45 %-80 %] 60 % Set Rate:  [20 bmp] 20 bmp Vt Set:  [320 mL] 320 mL PEEP:  [5 cmH20] 5 cmH20 Pressure Support:  [8 cmH20] 8 cmH20 Plateau Pressure:  [17 cmH20-21 cmH20] 19 cmH20   Intake/Output Summary (Last 24 hours) at 03/13/2022 0906 Last data filed at 03/13/2022 0800 Gross per 24 hour  Intake 2904.93 ml  Output 2805 ml  Net 99.93 ml    Filed Weights   03/10/22 0242 03/11/22 0500 03/12/22 0424  Weight: 80.6 kg 83.8 kg 83.9 kg   Physical Examination  General:  critically ill appearing adult female lying in bed on vent in NAD HEENT: MM pink/moist, ETT, anicteric, pupils 25m reactive  Neuro: sedate on fentanyl  CV:  s1s2 RRR, ST 110's, no appreciable murmurs PULM: non-labored at rest, lungs bilaterally clear GI: soft, bsx4 active, tolerating TF  Extremities: warm/dry, 1+ BUE edema, blistering on fingers from prior rings in place, peeling of skin on right two middle fingers, skin warm/dry, tattoos  Assessment & Plan:    Sepsis, Streptococcal Bacteremia Acute Diarrhea  Hypotension - resolved -continue ampicillin  -repeat blood cultures 1/3 -off vasopressors  -will likely need TEE at some point   Acute Hypoxemic Respiratory Failure Suspect related to acidosis, volume overload based on worsening chest x-ray with relatively clear chest x-ray on admission. -LTVV with SIMV -wean PEEP / FiO2 for sats >90% -follow intermittent CXR  -volume removal with CRRT -PAD protocol with fentanyl for sedation -RASS Goal 0 to -1 with ventilator synchrony  -continue brovana, pulmicort  Rash / Hives  Appears to be consistent with hives. Completed pepcid, benadryl -completed steroids  -monitor rash, skin findings   Oliguric Renal Failure, likely ATN in the setting of above Anion gap metabolic acidosis with Lactic Acidosis Elevated BUN  -appreciate Nephrology's assistance with patient care  -sodium bicarbonate infusion per Nephrology -consider negative 47m/hr, defer to Nephrology  -Trend BMP / urinary output -Replace electrolytes as indicated -Avoid nephrotoxic agents, ensure adequate renal perfusion -hopeful for renal recovery   Thrombocytopenia  Suspect acute phase reactant, sepsis  -DIC panel reviewed, fibrinogen 724, INR 1.1, D-Dimer 4.61 -follow CBC  Transaminitis, shock liver -follow LFT's -supportive care   UDS Positive Amphetamines, cannabis, tricyclic's -cessation counseling when appropriate   Bipolar Disorder  Chronic Pain  -resume home seroquel at reduced dose -resume home vilazodone   -hold other agents  At Risk Malnutrition  -continue TF   Best practice:  Diet:  Tube Feed  Pain/Anxiety/Delirium protocol (if indicated): No VAP protocol (if indicated): Not indicated DVT prophylaxis: LMWH GI prophylaxis: PPI Glucose control:  SSI No Central venous access:  Yes, and it is still needed Arterial line:  N/A Foley:  Yes, and it is still needed Mobility:  bed rest  PT consulted:  N/A Last date of multidisciplinary goals of care discussion:12/31  Code Status:  full code  Patients boyfriend, [name] updated at bedside on plan of care. Son, AMarjory Lies called for update via phone.   Critical Care Time: 34 minutes    BNoe Gens MSN, APRN, NP-C, AGACNP-BC [facility] 03/13/2022, 9:20 AM   Please see [email or web address] for pager details.   From 7A-7P if no response, please call [phone] After hours, please call ELink [phone]

## 2022-03-13 NOTE — Progress Notes (Signed)
Subjective:   she was started on CRRT on 1/2 after nontunneled catheter with critical care.  She had 1.8 liters UF over 1/2 with CRRT.  She has been anuric over 1/2 and 1/3.  Updated her son and significant other/boyfriend at bedside.  She has been off of pressors since yesterday am per nursing.  Per nursing pulling net negative 50 ml/hr without issue.  Patient has had multiple rings cut off her hand  Review of systems: Unable to obtain secondary to intubated and sedated   Objective Vital signs in last 24 hours: Vitals:   03/13/22 1330 03/13/22 1400 03/13/22 1430 03/13/22 1456  BP: (!) 106/59 (!) 105/58 108/64   Pulse: (!) 107 (!) 108 (!) 106   Resp: 16 19 18    Temp: 99.1 F (37.3 C) 99.3 F (37.4 C) 99.3 F (37.4 C)   TempSrc:      SpO2: 91% 91% 91% 91%  Weight:      Height:       Weight change:   Intake/Output Summary (Last 24 hours) at 03/13/2022 1550 Last data filed at 03/13/2022 1500 Gross per 24 hour  Intake 2487.14 ml  Output 3390.07 ml  Net -902.93 ml    Assessment/Plan: [name] with many medical issues-  now presents with AKI in the setting of a strep bacteremia, volume depletion  1.AKI-  crt of 0.8 in August of 2023, bland UA in October of 23.  Now with AKI , proteinuria and hematuria but also pyuria.  Seems most likely to be AKI from ATN in the setting of bacteremia and given the proteinuria and hematuria concerning for GN.  CK normal. Started CRRT on 1/2 after nontunneled catheter with critical care - low UOP, worsening azotemia, clinical worsening. ANA negative  - ANCA pending;  C3 slightly low, C4 normal - increase UF goal to 50-100 ml/hr as tolerated - could have a strep related GN - the treatment would be supportive    2. Hypertension/volume  - off of pressors   3. Hyperkalemia-  due to AKI - improved with CRRT  transition to 4K fluids for CRRT   4. Metabolic acidosis-  bicarb drip initially and now on CRRT.  Will remove bicarb gtt order  5. Normocytic  Anemia  - CBC in AM  6. Strep bacteremia-  on rocephin per CCM  7. Acute hypoxic respiratory failure  - mechanical ventilation per primary team     Disposition - in ICU on CRRT    Labs: Basic Metabolic Panel: Recent Labs  Lab 03/12/22 1648 03/13/22 0435 03/13/22 0903  NA 134* 133* 136  K 5.2* 3.9 4.2  CL 94* 96* 100  CO2 24 26 27   GLUCOSE 153* 134* 138*  BUN 95* 61* 53*  CREATININE 5.86* 3.67* 3.21*  CALCIUM 7.2* 7.1* 7.8*  PHOS 5.3*  5.6* 4.5 4.2   Liver Function Tests: Recent Labs  Lab 03/09/22 1350 03/11/22 1642 03/12/22 1648 03/13/22 0435 03/13/22 0903  AST 311*  --   --   --   --   ALT 122*  --   --   --   --   ALKPHOS 75  --   --   --   --   BILITOT 1.4*  --   --   --   --   PROT 6.3*  --   --   --   --   ALBUMIN 3.0*   < > 2.2* 2.1* 2.2*   < > = values  in this interval not displayed.   Recent Labs  Lab 03/09/22 2204  LIPASE 88*   No results for input(s): "AMMONIA" in the last 168 hours. CBC: Recent Labs  Lab 03/09/22 1350 03/10/22 0308 03/11/22 0539 03/12/22 0418 03/13/22 0859  WBC 29.2* 31.8* 25.6* 18.0*  --   NEUTROABS 25.2*  --   --   --   --   HGB 16.0* 14.6 11.4* 10.4*  --   HCT 48.7* 44.3 33.1* 29.6*  --   MCV 98.8 97.8 95.4 92.5  --   PLT 303 275 156 117* 99*   Cardiac Enzymes: Recent Labs  Lab 03/10/22 0821  CKTOTAL 144   CBG: Recent Labs  Lab 03/12/22 2333 03/13/22 0324 03/13/22 0724 03/13/22 1208 03/13/22 1545  GLUCAP 152* 117* 119* 146* 167*    Iron Studies: No results for input(s): "IRON", "TIBC", "TRANSFERRIN", "FERRITIN" in the last 72 hours. Studies/Results: DG CHEST PORT 1 VIEW  Result Date: 03/13/2022 CLINICAL DATA:  Acute respiratory failure with hypoxia. EXAM: PORTABLE CHEST 1 VIEW COMPARISON:  March 11, 2022. FINDINGS: Stable cardiomediastinal silhouette. Stable bilateral lung opacities are noted concerning for multifocal pneumonia. Endotracheal and feeding tubes are unchanged. Left internal jugular  catheter is unchanged. Bony thorax is unremarkable. IMPRESSION: Grossly stable support apparatus.  Stable bilateral lung opacities. Electronically Signed   By: Marijo Conception M.D.   On: 03/13/2022 10:19   DG CHEST PORT 1 VIEW  Result Date: 03/11/2022 CLINICAL DATA:  Status post intubation EXAM: PORTABLE CHEST 1 VIEW COMPARISON:  Film from earlier in the same day. FINDINGS: Cardiac shadow is enlarged but stable. Feeding catheter and left jugular central line are again noted and stable. Endotracheal tube is seen 2 cm above the carina. Lungs are well aerated bilaterally. Patchy airspace opacities are seen bilaterally right greater than left. IMPRESSION: Endotracheal tube in satisfactory position. Stable patchy airspace opacities bilaterally. Electronically Signed   By: Inez Catalina M.D.   On: 03/11/2022 23:58   DG Chest Port 1 View  Result Date: 03/11/2022 CLINICAL DATA:  Hypoxia EXAM: PORTABLE CHEST 1 VIEW COMPARISON:  Film from earlier in the same day. FINDINGS: Cardiac shadow is enlarged. Feeding catheter and left jugular central line are again seen and stable. Patchy opacities are noted in the bases bilaterally as well as the right mid lung new from the prior exam. No bony abnormality is noted. IMPRESSION: New bilateral airspace opacities consistent with multifocal pneumonia. Electronically Signed   By: Inez Catalina M.D.   On: 03/11/2022 21:16   Medications: Infusions:  sodium chloride Stopped (03/12/22 1305)   ampicillin (OMNIPEN) IV Stopped (03/13/22 1233)   fentaNYL infusion INTRAVENOUS 175 mcg/hr (03/13/22 1501)   prismasol BGK 2/2.5 dialysis solution 1,500 mL/hr at 03/13/22 1534   prismasol BGK 2/2.5 replacement solution 400 mL/hr at 03/13/22 0430   prismasol BGK 2/2.5 replacement solution 400 mL/hr at 03/13/22 0430   sodium bicarbonate 150 mEq in dextrose 5 % 1,150 mL infusion Stopped (03/12/22 2245)    Scheduled Medications:  arformoterol  15 mcg Nebulization BID   budesonide (PULMICORT)  nebulizer solution  0.5 mg Nebulization BID   Chlorhexidine Gluconate Cloth  6 each Topical Daily   feeding supplement (PROSource TF20)  60 mL Per Tube Daily   feeding supplement (VITAL 1.5 CAL)  1,000 mL Per Tube Q24H   heparin  5,000 Units Subcutaneous Q8H   mouth rinse  15 mL Mouth Rinse Q2H   pantoprazole (PROTONIX) IV  40 mg Intravenous Daily  QUEtiapine  100 mg Per Tube QHS   revefenacin  175 mcg Nebulization Daily   sodium chloride flush  10-40 mL Intracatheter Q12H   Thrombi-Pad  1 each Topical Once   Vilazodone HCl  40 mg Per Tube Daily    have reviewed scheduled and prn medications.  Physical Exam:   General adult female in bed critically ill  HEENT normocephalic atraumatic  Neck supple trachea midline Lungs coarse mechanical breath sounds; on 60 FIO2 and PEEP 5 Heart S1S2 no rub Abdomen soft nontender nondistended Extremities trace lower extremity and 1+ upper extremity edema.  Fingers with indentations from prior rings (all rings now off) Neuro - sedation currently running  Access right femoral nontunneled catheter     Claudia Desanctis, MD 03/13/2022,4:26 PM  LOS: 3 days

## 2022-03-13 NOTE — Progress Notes (Signed)
[place] Progress Note Patient Name: DANNIELA MCBREARTY DOB: 07/18/1965 MRN: [id number]   Date of Service  03/13/2022  HPI/Events of Note  Agitation - Nursing request to renew restraint orders.  eICU Interventions  Plan: Will renew restraint orders X 8 hours.      Intervention Category Major Interventions: Delirium, psychosis, severe agitation - evaluation and management  Tenee Wish Eugene 03/13/2022, 1:58 AM

## 2022-03-13 NOTE — Progress Notes (Addendum)
[place] Progress Note Patient Name: PANSY OSTROVSKY DOB: 12/27/1965 MRN: [id number]   Date of Service  03/13/2022  HPI/Events of Note  Patient oozing continuously from abdominal [place] Heparin injection site. Nursing questioning if Heparin [place] still indicated. PTT at 8:59 AM = 46.  eICU Interventions  Plan: D/C Heparin [place]. Place and maintain SCDs. Repeat PTT in AM.     Intervention Category Major Interventions: Other:  Lysle Dingwall 03/13/2022, 9:16 PM

## 2022-03-14 ENCOUNTER — Inpatient Hospital Stay (HOSPITAL_COMMUNITY): Payer: Medicaid Other

## 2022-03-14 DIAGNOSIS — R7881 Bacteremia: Secondary | ICD-10-CM

## 2022-03-14 DIAGNOSIS — R197 Diarrhea, unspecified: Secondary | ICD-10-CM

## 2022-03-14 DIAGNOSIS — L509 Urticaria, unspecified: Secondary | ICD-10-CM

## 2022-03-14 DIAGNOSIS — F1910 Other psychoactive substance abuse, uncomplicated: Secondary | ICD-10-CM | POA: Diagnosis not present

## 2022-03-14 DIAGNOSIS — B955 Unspecified streptococcus as the cause of diseases classified elsewhere: Secondary | ICD-10-CM

## 2022-03-14 DIAGNOSIS — N179 Acute kidney failure, unspecified: Secondary | ICD-10-CM | POA: Diagnosis not present

## 2022-03-14 LAB — CULTURE, RESPIRATORY W GRAM STAIN
Culture: NO GROWTH
Gram Stain: NONE SEEN

## 2022-03-14 LAB — COMPREHENSIVE METABOLIC PANEL
ALT: 26 U/L (ref 0–44)
AST: 38 U/L (ref 15–41)
Albumin: 2.2 g/dL — ABNORMAL LOW (ref 3.5–5.0)
Alkaline Phosphatase: 88 U/L (ref 38–126)
Anion gap: 7 (ref 5–15)
BUN: 28 mg/dL — ABNORMAL HIGH (ref 6–20)
CO2: 27 mmol/L (ref 22–32)
Calcium: 8.2 mg/dL — ABNORMAL LOW (ref 8.9–10.3)
Chloride: 103 mmol/L (ref 98–111)
Creatinine, Ser: 1.73 mg/dL — ABNORMAL HIGH (ref 0.44–1.00)
GFR, Estimated: 34 mL/min — ABNORMAL LOW (ref >60)
Glucose, Bld: 159 mg/dL — ABNORMAL HIGH (ref 70–99)
Potassium: 4.4 mmol/L (ref 3.5–5.1)
Sodium: 137 mmol/L (ref 135–145)
Total Bilirubin: 0.4 mg/dL (ref 0.3–1.2)
Total Protein: 5.6 g/dL — ABNORMAL LOW (ref 6.5–8.1)

## 2022-03-14 LAB — GLUCOSE, CAPILLARY
Glucose-Capillary: 147 mg/dL — ABNORMAL HIGH (ref 70–99)
Glucose-Capillary: 148 mg/dL — ABNORMAL HIGH (ref 70–99)
Glucose-Capillary: 154 mg/dL — ABNORMAL HIGH (ref 70–99)
Glucose-Capillary: 154 mg/dL — ABNORMAL HIGH (ref 70–99)
Glucose-Capillary: 156 mg/dL — ABNORMAL HIGH (ref 70–99)
Glucose-Capillary: 157 mg/dL — ABNORMAL HIGH (ref 70–99)
Glucose-Capillary: 166 mg/dL — ABNORMAL HIGH (ref 70–99)

## 2022-03-14 LAB — RENAL FUNCTION PANEL
Albumin: 1.9 g/dL — ABNORMAL LOW (ref 3.5–5.0)
Albumin: 2 g/dL — ABNORMAL LOW (ref 3.5–5.0)
Anion gap: 6 (ref 5–15)
Anion gap: 6 (ref 5–15)
BUN: 29 mg/dL — ABNORMAL HIGH (ref 6–20)
BUN: 26 mg/dL — ABNORMAL HIGH (ref 6–20)
CO2: 27 mmol/L (ref 22–32)
CO2: 26 mmol/L (ref 22–32)
Calcium: 7.7 mg/dL — ABNORMAL LOW (ref 8.9–10.3)
Calcium: 7.7 mg/dL — ABNORMAL LOW (ref 8.9–10.3)
Chloride: 104 mmol/L (ref 98–111)
Chloride: 105 mmol/L (ref 98–111)
Creatinine, Ser: 1.8 mg/dL — ABNORMAL HIGH (ref 0.44–1.00)
Creatinine, Ser: 1.72 mg/dL — ABNORMAL HIGH (ref 0.44–1.00)
GFR, Estimated: 33 mL/min — ABNORMAL LOW (ref >60)
GFR, Estimated: 34 mL/min — ABNORMAL LOW (ref >60)
Glucose, Bld: 150 mg/dL — ABNORMAL HIGH (ref 70–99)
Glucose, Bld: 163 mg/dL — ABNORMAL HIGH (ref 70–99)
Phosphorus: 2.8 mg/dL (ref 2.5–4.6)
Phosphorus: 2.3 mg/dL — ABNORMAL LOW (ref 2.5–4.6)
Potassium: 4.2 mmol/L (ref 3.5–5.1)
Potassium: 4.1 mmol/L (ref 3.5–5.1)
Sodium: 137 mmol/L (ref 135–145)
Sodium: 137 mmol/L (ref 135–145)

## 2022-03-14 LAB — CBC
HCT: 28.2 % — ABNORMAL LOW (ref 36.0–46.0)
Hemoglobin: 9.4 g/dL — ABNORMAL LOW (ref 12.0–15.0)
MCH: 32.3 pg (ref 26.0–34.0)
MCHC: 33.3 g/dL (ref 30.0–36.0)
MCV: 96.9 fL (ref 80.0–100.0)
Platelets: 97 10*3/uL — ABNORMAL LOW (ref 150–400)
RBC: 2.91 MIL/uL — ABNORMAL LOW (ref 3.87–5.11)
RDW: 17.7 % — ABNORMAL HIGH (ref 11.5–15.5)
WBC: 20.2 10*3/uL — ABNORMAL HIGH (ref 4.0–10.5)
nRBC: 0.3 % — ABNORMAL HIGH (ref 0.0–0.2)

## 2022-03-14 LAB — MAGNESIUM: Magnesium: 2.5 mg/dL — ABNORMAL HIGH (ref 1.7–2.4)

## 2022-03-14 LAB — APTT: aPTT: 34 seconds (ref 24–36)

## 2022-03-14 MED ORDER — ACETAMINOPHEN 160 MG/5ML PO SOLN
650.0000 mg | Freq: Four times a day (QID) | ORAL | Status: DC | PRN
  Administered 2022-03-21 – 2022-03-28 (×6): 650 mg
  Filled 2022-03-14 (×6): qty 20.3

## 2022-03-14 MED ORDER — DEXMEDETOMIDINE HCL IN NACL 200 MCG/50ML IV SOLN
0.0000 ug/kg/h | INTRAVENOUS | Status: DC
  Administered 2022-03-14: 0.6 ug/kg/h via INTRAVENOUS
  Administered 2022-03-14: 0.4 ug/kg/h via INTRAVENOUS
  Administered 2022-03-14 – 2022-03-15 (×2): 0.6 ug/kg/h via INTRAVENOUS
  Filled 2022-03-14 (×5): qty 50

## 2022-03-14 MED ORDER — DARBEPOETIN ALFA 40 MCG/0.4ML IJ SOSY
40.0000 ug | PREFILLED_SYRINGE | INTRAMUSCULAR | Status: DC
  Administered 2022-03-14: 40 ug via SUBCUTANEOUS
  Filled 2022-03-14 (×2): qty 0.4

## 2022-03-14 MED ORDER — VITAL 1.5 CAL PO LIQD
1000.0000 mL | ORAL | Status: DC
  Administered 2022-03-14 – 2022-03-23 (×10): 1000 mL
  Filled 2022-03-14 (×7): qty 1000

## 2022-03-14 NOTE — Progress Notes (Signed)
[place] Progress Note Patient Name: Darlene Barber DOB: 02-Dec-1965 MRN: [id number]   Date of Service  03/14/2022  HPI/Events of Note  Fever - Temp = 100.2 F. Nursing request for Tylenol. AST and ALT now normal.   eICU Interventions  Plan: Tylenol liquid 650 mg per tube Q 6 hours PRN Temp > 101.0 F.      Intervention Category Major Interventions: Other:  Lysle Dingwall 03/14/2022, 10:18 PM

## 2022-03-14 NOTE — Progress Notes (Signed)
[place] Progress Note Patient Name: Darlene Barber DOB: 1965-09-08 MRN: [id number]   Date of Service  03/14/2022  HPI/Events of Note  Nursing request for CMP to see if patient can have Tylenol.   eICU Interventions  Will order CMP now.      Intervention Category Major Interventions: Other:  Lysle Dingwall 03/14/2022, 8:23 PM

## 2022-03-14 NOTE — Progress Notes (Signed)
NAME:  Darlene Barber, MRN:  [id number], DOB:  12-29-1965, LOS: 4 ADMISSION DATE:  03/10/2022, CONSULTATION DATE:  12/30  REFERRING MD:  Dr. Cinda Quest, CHIEF COMPLAINT:  Abdominal Pain    History of Present Illness:  [name] who presented to the [facility] ER on 12/31 with reports of abdominal pain and widespread rash.  On admission, the patient significant other reported that the day after eating at a local restaurant in [place], [place] she developed abdominal pain and rash.  She was reportedly up all night with crampy abdominal pain associated with nausea and vomiting.  Around 2 PM on the day of admission she noticed a rash all over her body and had worsening pain prompting him to call EMS.  She was evaluated in the emergency room and was found to have oxygen saturations of 87% on 2 L, afebrile with an elevated white blood cell count of 29.2.  Additional labs notable for acute kidney injury, anion gap metabolic acidosis and LFTs with mixed hepatic and cholestatic picture.  Stool samples were sent with workup of acute diarrhea.  Stool pathogen panel was negative.  C. difficile toxin was negative.  She was treated with 30 mL/kg IV fluid and started on empiric broad-spectrum antibiotics with vancomycin, cefepime and Flagyl.  She remained hypotensive despite IV fluid boluses and was started on vasopressors.  She was transferred to [facility] as there were no inpatient beds at [facility].    PCCM consulted for ICU admission.  Pertinent  Medical History  Chronic Pain Syndrome  Cervical Spondylitis  GERD  Bipolar Disorder  Tobacco Abuse - daily  THC Abuse - daily   Significant Hospital Events: Including procedures, antibiotic start and stop dates in addition to other pertinent events   12/31 admit with abdominal pain, diarrhea.  Left IJ TLC placement.  CT chest abdomen pelvis with fluid throughout the stomach, small bowel and colon, nonspecific but may represent diarrheal state.  No bowel obstruction,  definite bowel wall thickening or pneumoperitoneum.  Fluid within the esophagus may represent reflux or nonspecific esophageal motility disorder.  Trace amount of free pelvic fluid, nonspecific.  UDS positive for tricyclics, THC and amphetamines.  C. difficile negative, GI PCR negative.  COVID, flu, RSV negative.  Antibiotics narrowed to Zosyn. BC positives for strep infantarius.  1/3 CRRT, PEEP 5/60%  Interim History / Subjective:  Tmax 99.5 / WBC 20.5  Vent - 70%, PEEP 5  Glucose 147-166  I/O 34 ml UOP, 937ml stool, 2.4L removed with UF, -1.9L in last 24 hours RN reports pt appears uncomfortable despite sedation, more tachycardic / in 130's   Objective   Blood pressure 115/60, pulse (!) 107, temperature 99.5 F (37.5 C), resp. rate 18, height 4\' 10"  (1.473 m), weight 83.4 kg, SpO2 93 %.    Vent Mode: SIMV FiO2 (%):  [60 %-100 %] 60 % Set Rate:  [20 bmp] 20 bmp Vt Set:  [320 mL] 320 mL PEEP:  [5 cmH20-8 cmH20] 8 cmH20 Pressure Support:  [8 cmH20] 8 cmH20 Plateau Pressure:  [15 cmH20-20 cmH20] 15 cmH20   Intake/Output Summary (Last 24 hours) at 03/14/2022 0915 Last data filed at 03/14/2022 0800 Gross per 24 hour  Intake 1502.47 ml  Output 2757.37 ml  Net -1254.9 ml   Filed Weights   03/11/22 0500 03/12/22 0424 03/14/22 0500  Weight: 83.8 kg 83.9 kg 83.4 kg    Examination: General: critically ill appearing adult female lying in bed on vent, appears uncomfortable  HENT: MM pink/moist, ETT, anicteric, pupils =/reactive  Lungs: non-labored at rest, on SIMV, lungs bilaterally clear but diminished  Cardiovascular: s1s2 RRR, ST 130's, no m/r/g Abdomen: soft, non-tender, bsx4 active  Extremities: warm/dry, BUE 1+ edema  Neuro: sedate on fentanyl, no response to voice, moves all extremities spontaneously  GU: foley  Access: L IJ TLC c/d/I, R Fem HD with thrombi pad over site / dried blood   BC 1/3  ngtd > Tracheal aspirate 1/2 >   Resolved Hospital Problem list   Hypotension -  sepsis + sedation   Assessment & Plan:   Sepsis in setting of Streptococcal Bacteremia  Acute Diarrhea  -continue ampicillin -follow up repeat blood cultures, tracheal aspirate  -will likely need TEE at some point   Acute Hypoxemic Respiratory Failure  Tobacco + THC Abuse  In setting of acidosis, volume overload on CXR. Note relatively clear film on admit.  -SIMV with LTVV  -wean PEEP / FiO2 for sats >90% -volume removal with CRRT  -brovana, pulmicort  -follow intermittent CXR  -PAD protocol - fentanyl, add precedex. Increase fentanyl ceiling.  -RASS Goal 0 to -1  -WUA / SBT when able   Rash, Hives Completed pepcid, benadryl, steroids.  -monitor / supportive care   Acute Metabolic / Toxic Encephalopathy  -sedation as above  -PT efforts when able   Oliguric Renal Failure, likely ATN  Anion Gap Metabolic Acidosis with Lactic Acidosis  Elevated BUN  -appreciate Nephrology  -CRRT for volume removal  -Trend BMP / urinary output -Replace electrolytes as indicated -Avoid nephrotoxic agents, ensure adequate renal perfusion -hopeful for renal recovery   Thrombocytopenia  4T score 2, suspect in setting of sepsis/bacteremia, CRRT. Less likely HIT.  -trend CBC  -no indication for transfusion at this time   Transaminitis Shock Liver -follow LFT's  -supportive care   UDS Positive: Amphetamines, Cannabis, TCA's  -cessation counseling when appropriate   Bipolar Disorder Chronic Pain  -continue seroquel at reduced dose -continue home vilazodone  At Risk Malnutrition  -TF per Nutrition   Best Practice (right click and "Reselect all SmartList Selections" daily)  Diet/type: tubefeeds DVT prophylaxis: SCD GI prophylaxis: PPI Lines: Central line and Dialysis Catheter Foley:  Yes, and it is still needed Code Status:  full code Last date of multidisciplinary goals of care discussion: 1/4 - boyfriend updated at bedside on plan of care.   Critical care time: 93 minutes      Noe Gens, MSN, APRN, NP-C, AGACNP-BC [facility] 03/14/2022, 9:16 AM   Please see [email or web address] for pager details.   From 7A-7P if no response, please call [phone] After hours, please call ELink [phone]

## 2022-03-14 NOTE — Progress Notes (Signed)
Brief Nutrition Support Update   Patient has remained on TF of Vital 1.5 at 78mL/hr, tolerating well.   Per discussion with CCM MD, can advance past trickles towards goal today.  Electrolytes stable.    Tube feeding via OG: Vital 1.5 at 40 ml/h (960 ml per day) *Start at 70mL/hr and advance by 41mL Q8H  Prosource TF20 60 ml daily Provides 1520 kcal, 85 gm protein, 733 ml free water daily  - Free water flushes per MD - Monitor magnesium, potassium, and phosphorus, MD to replete as needed.    Samson Frederic RD, LDN For contact information, refer to [facility].

## 2022-03-14 NOTE — Progress Notes (Signed)
Renal Progress Note:   Subjective:   She had 2.5 L UF over 1/3 with CRRT.  She was anuric over 1/3.  She is off off pressors.  She has been tolerating net negative 100 ml/hr.  She has been tachycardic and is at 115 on my exam. Spoke with her significant other at bedside  Review of systems:  Unable to obtain secondary to intubated and sedated   Objective Vital signs in last 24 hours: Vitals:   03/14/22 0900 03/14/22 1000 03/14/22 1100 03/14/22 1111  BP: 105/60 104/65 (!) 149/85   Pulse: (!) 118 (!) 115 (!) 133   Resp: 20 (!) 24 18   Temp: 100 F (37.8 C) 100 F (37.8 C) 100.2 F (37.9 C)   TempSrc:      SpO2: 92% 94% (!) 89% 94%  Weight:      Height:       Weight change:   Intake/Output Summary (Last 24 hours) at 03/14/2022 1226 Last data filed at 03/14/2022 1200 Gross per 24 hour  Intake 1611.76 ml  Output 3229.56 ml  Net -1617.8 ml    Assessment/Plan: [name] with many medical issues-  now presents with AKI in the setting of a strep bacteremia, volume depletion    1.AKI-   - crt of 0.8 in August of 2023, bland UA in October of 23.  Now with AKI , proteinuria and hematuria but also pyuria.  Seems most likely to be AKI from ATN in the setting of bacteremia and given the proteinuria and hematuria concerning for GN.  CK normal. Started CRRT on 1/2 after nontunneled catheter with critical care - low UOP, worsening azotemia, clinical worsening. ANA and ANCA negative.  C3 slightly low, C4 normal - UF goal 50 - 100 ml/hr as tolerated (tolerating 100 ml/hr per nursing; tachycardia improved with sedation adjustment per nursing) - Reduce dialysate to 1.5 liters/hr - could have a strep related GN - the treatment would be supportive    2. Hypertension/volume  - off of pressors   3. Hyperkalemia-  due to AKI - improved with CRRT and now on 4K fluids for CRRT   4. Metabolic acidosis-  bicarb drip initially and now on CRRT; improved with CRRT  5. Normocytic Anemia  - CBC in AM;  defer IV iron in setting of infection.  Aranesp 40 mcg once today and every Thursday  6. Strep bacteremia-  antibiotics per CCM  7. Acute hypoxic respiratory failure  - mechanical ventilation per primary team     Disposition - in ICU on CRRT    Labs: Basic Metabolic Panel: Recent Labs  Lab 03/13/22 0903 03/13/22 1651 03/14/22 0515  NA 136 135 137  K 4.2 4.3 4.2  CL 100 100 104  CO2 27 27 27   GLUCOSE 138* 171* 150*  BUN 53* 44* 29*  CREATININE 3.21* 2.61* 1.80*  CALCIUM 7.8* 7.6* 7.7*  PHOS 4.2 4.1 2.8   Liver Function Tests: Recent Labs  Lab 03/09/22 1350 03/11/22 1642 03/13/22 0903 03/13/22 1651 03/14/22 0515  AST 311*  --   --   --   --   ALT 122*  --   --   --   --   ALKPHOS 75  --   --   --   --   BILITOT 1.4*  --   --   --   --   PROT 6.3*  --   --   --   --   ALBUMIN 3.0*   < >  2.2* 2.1* 1.9*   < > = values in this interval not displayed.   Recent Labs  Lab 03/09/22 2204  LIPASE 88*   No results for input(s): "AMMONIA" in the last 168 hours. CBC: Recent Labs  Lab 03/09/22 1350 03/10/22 0308 03/11/22 0539 03/12/22 0418 03/13/22 0859 03/14/22 0515  WBC 29.2* 31.8* 25.6* 18.0*  --  20.2*  NEUTROABS 25.2*  --   --   --   --   --   HGB 16.0* 14.6 11.4* 10.4*  --  9.4*  HCT 48.7* 44.3 33.1* 29.6*  --  28.2*  MCV 98.8 97.8 95.4 92.5  --  96.9  PLT 303 275 156 117* 99* 97*   Cardiac Enzymes: Recent Labs  Lab 03/10/22 0821  CKTOTAL 144   CBG: Recent Labs  Lab 03/13/22 1545 03/13/22 1932 03/14/22 0004 03/14/22 0407 03/14/22 0814  GLUCAP 167* 166* 154* 154* 147*    Iron Studies: No results for input(s): "IRON", "TIBC", "TRANSFERRIN", "FERRITIN" in the last 72 hours. Studies/Results: DG CHEST PORT 1 VIEW  Result Date: 03/14/2022 CLINICAL DATA:  57 year old female with respiratory failure. Sepsis. EXAM: PORTABLE CHEST 1 VIEW COMPARISON:  Portable chest 03/13/2022 and earlier. FINDINGS: Portable AP semi upright view at 0440 hours. The  patient remains rotated to the right. Stable endotracheal tube, left IJ central line. Enteric tube loops in the stomach and retained small volume gastric barium is unchanged. Stable lung volumes and mediastinal contours. Increased right greater than left pulmonary reticular interstitial opacity has mildly improved since 03/11/2022. No pneumothorax, pleural effusion or area of worsening ventilation. Paucity of bowel gas in the visible abdomen. IMPRESSION: 1. Stable lines and tubes. 2. Right greater than left pulmonary interstitial opacity has mildly improved since 03/11/2022. No new cardiopulmonary abnormality. Electronically Signed   By: Genevie Ann M.D.   On: 03/14/2022 06:38   DG CHEST PORT 1 VIEW  Result Date: 03/13/2022 CLINICAL DATA:  Acute respiratory failure with hypoxia. EXAM: PORTABLE CHEST 1 VIEW COMPARISON:  March 11, 2022. FINDINGS: Stable cardiomediastinal silhouette. Stable bilateral lung opacities are noted concerning for multifocal pneumonia. Endotracheal and feeding tubes are unchanged. Left internal jugular catheter is unchanged. Bony thorax is unremarkable. IMPRESSION: Grossly stable support apparatus.  Stable bilateral lung opacities. Electronically Signed   By: Marijo Conception M.D.   On: 03/13/2022 10:19   Medications: Infusions:   prismasol BGK 4/2.5 400 mL/hr at 03/14/22 0506    prismasol BGK 4/2.5 400 mL/hr at 03/14/22 0506   sodium chloride Stopped (03/12/22 1305)   ampicillin (OMNIPEN) IV Stopped (03/14/22 1150)   dexmedetomidine (PRECEDEX) IV infusion 0.5 mcg/kg/hr (03/14/22 1200)   fentaNYL infusion INTRAVENOUS 225 mcg/hr (03/14/22 1200)   prismasol BGK 4/2.5 1,800 mL/hr at 03/14/22 3646    Scheduled Medications:  arformoterol  15 mcg Nebulization BID   budesonide (PULMICORT) nebulizer solution  0.5 mg Nebulization BID   Chlorhexidine Gluconate Cloth  6 each Topical Daily   feeding supplement (PROSource TF20)  60 mL Per Tube Daily   feeding supplement (VITAL 1.5 CAL)   1,000 mL Per Tube Q24H   mouth rinse  15 mL Mouth Rinse Q2H   pantoprazole (PROTONIX) IV  40 mg Intravenous Daily   QUEtiapine  100 mg Per Tube QHS   revefenacin  175 mcg Nebulization Daily   sodium chloride flush  10-40 mL Intracatheter Q12H   Thrombi-Pad  1 each Topical Once   Vilazodone HCl  40 mg Per Tube Daily    have reviewed  scheduled and prn medications.  Physical Exam:    General adult female in bed critically ill  HEENT normocephalic atraumatic  Neck supple trachea midline Lungs coarse mechanical breath sounds; on 60 FIO2 and PEEP 8 Heart S1S2 no rub Abdomen soft nontender nondistended Extremities trace lower extremity and 1-2+ upper extremity edema.  Neuro - sedation currently running  Access right femoral nontunneled catheter in place   Claudia Desanctis, MD 03/14/2022,12:46 PM  LOS: 4 days

## 2022-03-14 NOTE — TOC Progression Note (Signed)
Transition of Care [facility]) - Progression Note    Patient Details  Name: GENOVEVA SINGLETON MRN: [id number] Date of Birth: [birth year]  [facility] CM/SW Contact  Leeroy Cha, RN Phone Number: 03/14/2022, 7:34 AM  Clinical Narrative:    Remains on the vent toc following    Expected Discharge Plan: Home/Self Care Barriers to Discharge: Continued Medical Work up  Expected Discharge Plan and Services                                               Social Determinants of Health (SDOH) Interventions SDOH Screenings   Depression (PHQ2-9): High Risk (09/03/2021)  Tobacco Use: High Risk (01/07/2022)    Readmission Risk Interventions   Row Labels 03/11/2022   12:18 PM  Readmission Risk Prevention Plan   Section Header. No data exists in this row.   Transportation Screening   Complete  Medication Review Press photographer)   Complete  PCP or Specialist appointment within 3-5 days of discharge   Complete  HRI or [place]   Complete  SW Recovery Care/Counseling Consult   Complete  [place]   Not Applicable

## 2022-03-15 ENCOUNTER — Inpatient Hospital Stay (HOSPITAL_COMMUNITY): Payer: Medicaid Other

## 2022-03-15 DIAGNOSIS — R6521 Severe sepsis with septic shock: Secondary | ICD-10-CM | POA: Diagnosis not present

## 2022-03-15 DIAGNOSIS — A419 Sepsis, unspecified organism: Secondary | ICD-10-CM | POA: Diagnosis not present

## 2022-03-15 DIAGNOSIS — I10 Essential (primary) hypertension: Secondary | ICD-10-CM | POA: Diagnosis not present

## 2022-03-15 DIAGNOSIS — R21 Rash and other nonspecific skin eruption: Secondary | ICD-10-CM | POA: Diagnosis not present

## 2022-03-15 DIAGNOSIS — J9601 Acute respiratory failure with hypoxia: Secondary | ICD-10-CM | POA: Diagnosis not present

## 2022-03-15 DIAGNOSIS — R7881 Bacteremia: Secondary | ICD-10-CM

## 2022-03-15 DIAGNOSIS — N179 Acute kidney failure, unspecified: Secondary | ICD-10-CM | POA: Diagnosis not present

## 2022-03-15 LAB — CBC
HCT: 28.8 % — ABNORMAL LOW (ref 36.0–46.0)
Hemoglobin: 9 g/dL — ABNORMAL LOW (ref 12.0–15.0)
MCH: 31.9 pg (ref 26.0–34.0)
MCHC: 31.3 g/dL (ref 30.0–36.0)
MCV: 102.1 fL — ABNORMAL HIGH (ref 80.0–100.0)
Platelets: 100 10*3/uL — ABNORMAL LOW (ref 150–400)
RBC: 2.82 MIL/uL — ABNORMAL LOW (ref 3.87–5.11)
RDW: 18.6 % — ABNORMAL HIGH (ref 11.5–15.5)
WBC: 25.1 10*3/uL — ABNORMAL HIGH (ref 4.0–10.5)
nRBC: 0.3 % — ABNORMAL HIGH (ref 0.0–0.2)

## 2022-03-15 LAB — BLOOD GAS, ARTERIAL
Acid-Base Excess: 1.5 mmol/L (ref 0.0–2.0)
Acid-Base Excess: 0.8 mmol/L (ref 0.0–2.0)
Bicarbonate: 30.5 mmol/L — ABNORMAL HIGH (ref 20.0–28.0)
Bicarbonate: 29.8 mmol/L — ABNORMAL HIGH (ref 20.0–28.0)
Drawn by: 560031
Drawn by: 560031
FIO2: 60 %
MECHVT: 320 mL
O2 Saturation: 97.9 %
O2 Saturation: 97.3 %
PEEP: 8 cmH2O
Patient temperature: 36.9
Patient temperature: 36.9
RATE: 20 resp/min
pCO2 arterial: 68 mmHg (ref 32–48)
pCO2 arterial: 68 mmHg (ref 32–48)
pH, Arterial: 7.26 — ABNORMAL LOW (ref 7.35–7.45)
pH, Arterial: 7.25 — ABNORMAL LOW (ref 7.35–7.45)
pO2, Arterial: 134 mmHg — ABNORMAL HIGH (ref 83–108)
pO2, Arterial: 114 mmHg — ABNORMAL HIGH (ref 83–108)

## 2022-03-15 LAB — RENAL FUNCTION PANEL
Albumin: 2 g/dL — ABNORMAL LOW (ref 3.5–5.0)
Anion gap: 7 (ref 5–15)
BUN: 26 mg/dL — ABNORMAL HIGH (ref 6–20)
CO2: 27 mmol/L (ref 22–32)
Calcium: 8.1 mg/dL — ABNORMAL LOW (ref 8.9–10.3)
Chloride: 104 mmol/L (ref 98–111)
Creatinine, Ser: 1.52 mg/dL — ABNORMAL HIGH (ref 0.44–1.00)
GFR, Estimated: 40 mL/min — ABNORMAL LOW (ref >60)
Glucose, Bld: 191 mg/dL — ABNORMAL HIGH (ref 70–99)
Phosphorus: 2.5 mg/dL (ref 2.5–4.6)
Potassium: 4.3 mmol/L (ref 3.5–5.1)
Sodium: 138 mmol/L (ref 135–145)

## 2022-03-15 LAB — GLUCOSE, CAPILLARY
Glucose-Capillary: 183 mg/dL — ABNORMAL HIGH (ref 70–99)
Glucose-Capillary: 187 mg/dL — ABNORMAL HIGH (ref 70–99)
Glucose-Capillary: 191 mg/dL — ABNORMAL HIGH (ref 70–99)
Glucose-Capillary: 160 mg/dL — ABNORMAL HIGH (ref 70–99)
Glucose-Capillary: 190 mg/dL — ABNORMAL HIGH (ref 70–99)
Glucose-Capillary: 163 mg/dL — ABNORMAL HIGH (ref 70–99)

## 2022-03-15 LAB — MAGNESIUM: Magnesium: 2.6 mg/dL — ABNORMAL HIGH (ref 1.7–2.4)

## 2022-03-15 MED ORDER — DEXMEDETOMIDINE HCL IN NACL 400 MCG/100ML IV SOLN
0.0000 ug/kg/h | INTRAVENOUS | Status: DC
  Administered 2022-03-15 (×2): 0.6 ug/kg/h via INTRAVENOUS
  Administered 2022-03-15: 0.7 ug/kg/h via INTRAVENOUS
  Administered 2022-03-19: 0.4 ug/kg/h via INTRAVENOUS
  Administered 2022-03-21 (×3): 1.2 ug/kg/h via INTRAVENOUS
  Administered 2022-03-21: 0.4 ug/kg/h via INTRAVENOUS
  Administered 2022-03-22 (×2): 1 ug/kg/h via INTRAVENOUS
  Administered 2022-03-22 (×2): 1.2 ug/kg/h via INTRAVENOUS
  Administered 2022-03-22: 1 ug/kg/h via INTRAVENOUS
  Administered 2022-03-23: 1.2 ug/kg/h via INTRAVENOUS
  Administered 2022-03-23: 0.3 ug/kg/h via INTRAVENOUS
  Administered 2022-03-23: 1.2 ug/kg/h via INTRAVENOUS
  Administered 2022-03-23: 0.8 ug/kg/h via INTRAVENOUS
  Filled 2022-03-15 (×18): qty 100

## 2022-03-15 MED ORDER — SODIUM PHOSPHATES 45 MMOLE/15ML IV SOLN
15.0000 mmol | Freq: Once | INTRAVENOUS | Status: AC
  Administered 2022-03-15: 15 mmol via INTRAVENOUS
  Filled 2022-03-15: qty 5

## 2022-03-15 MED ORDER — ORAL CARE MOUTH RINSE: 15.0000 mL | OROMUCOSAL | Status: DC | PRN

## 2022-03-15 MED ORDER — INSULIN ASPART 100 UNIT/ML IJ SOLN
0.0000 [IU] | INTRAMUSCULAR | Status: DC
  Administered 2022-03-15 – 2022-03-16 (×7): 1 [IU] via SUBCUTANEOUS
  Administered 2022-03-16: 2 [IU] via SUBCUTANEOUS
  Administered 2022-03-16 – 2022-03-17 (×4): 1 [IU] via SUBCUTANEOUS
  Administered 2022-03-17: 3 [IU] via SUBCUTANEOUS
  Administered 2022-03-17: 1 [IU] via SUBCUTANEOUS
  Administered 2022-03-17 (×2): 2 [IU] via SUBCUTANEOUS
  Administered 2022-03-18 (×2): 1 [IU] via SUBCUTANEOUS
  Administered 2022-03-18: 2 [IU] via SUBCUTANEOUS
  Administered 2022-03-18: 1 [IU] via SUBCUTANEOUS
  Administered 2022-03-18 – 2022-03-19 (×2): 2 [IU] via SUBCUTANEOUS
  Administered 2022-03-19: 3 [IU] via SUBCUTANEOUS
  Administered 2022-03-19: 1 [IU] via SUBCUTANEOUS
  Administered 2022-03-19 – 2022-03-20 (×2): 2 [IU] via SUBCUTANEOUS
  Administered 2022-03-20: 1 [IU] via SUBCUTANEOUS
  Administered 2022-03-20 (×3): 2 [IU] via SUBCUTANEOUS
  Administered 2022-03-20 – 2022-03-21 (×2): 3 [IU] via SUBCUTANEOUS
  Administered 2022-03-21: 1 [IU] via SUBCUTANEOUS
  Administered 2022-03-21 (×2): 2 [IU] via SUBCUTANEOUS
  Administered 2022-03-21: 1 [IU] via SUBCUTANEOUS
  Administered 2022-03-21: 3 [IU] via SUBCUTANEOUS
  Administered 2022-03-22 (×2): 1 [IU] via SUBCUTANEOUS
  Administered 2022-03-22 – 2022-03-23 (×2): 2 [IU] via SUBCUTANEOUS
  Administered 2022-03-23: 1 [IU] via SUBCUTANEOUS
  Administered 2022-03-23 (×2): 2 [IU] via SUBCUTANEOUS

## 2022-03-15 MED ORDER — IOHEXOL 9 MG/ML PO SOLN
500.0000 mL | ORAL | Status: AC
  Administered 2022-03-15: 500 mL via ORAL

## 2022-03-15 MED ORDER — IOHEXOL 9 MG/ML PO SOLN
ORAL | Status: AC
  Administered 2022-03-15: 500 mL via ORAL
  Filled 2022-03-15: qty 1000

## 2022-03-15 MED ORDER — ORAL CARE MOUTH RINSE
15.0000 mL | OROMUCOSAL | Status: DC
  Administered 2022-03-15 – 2022-03-21 (×74): 15 mL via OROMUCOSAL

## 2022-03-15 NOTE — Progress Notes (Signed)
  Echocardiogram Echocardiogram Transesophageal has been performed.  Darlene Barber 03/15/2022, 12:44 PM

## 2022-03-15 NOTE — Progress Notes (Signed)
Renal Progress Note:   Subjective:   She had 3.8 L UF over 1/4 with CRRT.  She was anuric over 1/4.  She has been tolerating net negative 100 ml/hr.  She is going down for a CT scan shortly and has just been disconnected from CRRT. Filter was about to expire so they coordinated testing   Review of systems:   Unable to obtain secondary to intubated and sedated   Objective Vital signs in last 24 hours: Vitals:   03/15/22 1100 03/15/22 1123 03/15/22 1153 03/15/22 1423  BP:      Pulse: (!) 127     Resp: 18     Temp: 100.2 F (37.9 C)     TempSrc:      SpO2: 97% 97% 97% 97%  Weight:      Height:       Weight change: -0.9 kg  Intake/Output Summary (Last 24 hours) at 03/15/2022 1501 Last data filed at 03/15/2022 1400 Gross per 24 hour  Intake 2788.46 ml  Output 4647 ml  Net -1858.54 ml    Assessment/Plan: [name] with many medical issues-  now presents with AKI in the setting of a strep bacteremia, volume depletion    1.AKI-   - crt of 0.8 in August of 2023, bland UA in October of 23.  Now with AKI , proteinuria and hematuria but also pyuria.  Seems most likely to be AKI from ATN in the setting of bacteremia and given the proteinuria and hematuria concerning for GN.  CK normal. Started CRRT on 1/2 after nontunneled catheter with critical care - low UOP, worsening azotemia, clinical worsening. ANA and ANCA negative.  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 Hypertension/volume  - off of pressors   3. Hyperkalemia-  due to AKI - improved with CRRT and now on 4K fluids for CRRT   4. Metabolic acidosis-  bicarb drip initially and now on CRRT; improved with CRRT  5. Normocytic Anemia  - defer IV iron in setting of infection.  Aranesp 40 mcg once today and every Thursday  6. Strep bacteremia-  streptococcus  infantarius. antibiotics per CCM. ID consulted.  They have recommended an additional imaging.  Avoid IV contrast if able   7. Acute hypoxic respiratory failure  - mechanical ventilation per primary team     Disposition - in ICU on CRRT    Labs: Basic Metabolic Panel: Recent Labs  Lab 03/14/22 0515 03/14/22 1714 03/14/22 2100 03/15/22 0444  NA 137 137 137 138  K 4.2 4.1 4.4 4.3  CL 104 105 103 104  CO2 27 26 27 27   GLUCOSE 150* 163* 159* 191*  BUN 29* 26* 28* 26*  CREATININE 1.80* 1.72* 1.73* 1.52*  CALCIUM 7.7* 7.7* 8.2* 8.1*  PHOS 2.8 2.3*  --  2.5   Liver Function Tests: Recent Labs  Lab 03/09/22 1350 03/11/22 1642 03/14/22 1714 03/14/22 2100 03/15/22 0444  AST 311*  --   --  38  --   ALT 122*  --   --  26  --   ALKPHOS 75  --   --  88  --   BILITOT 1.4*  --   --  0.4  --   PROT 6.3*  --   --  5.6*  --  ALBUMIN 3.0*   < > 2.0* 2.2* 2.0*   < > = values in this interval not displayed.   Recent Labs  Lab 03/09/22 2204  LIPASE 88*   No results for input(s): "AMMONIA" in the last 168 hours. CBC: Recent Labs  Lab 03/09/22 1350 03/10/22 0308 03/11/22 0539 03/12/22 0418 03/13/22 0859 03/14/22 0515 03/15/22 0444  WBC 29.2* 31.8* 25.6* 18.0*  --  20.2* 25.1*  NEUTROABS 25.2*  --   --   --   --   --   --   HGB 16.0* 14.6 11.4* 10.4*  --  9.4* 9.0*  HCT 48.7* 44.3 33.1* 29.6*  --  28.2* 28.8*  MCV 98.8 97.8 95.4 92.5  --  96.9 102.1*  PLT 303 275 156 117* 99* 97* 100*   Cardiac Enzymes: Recent Labs  Lab 03/10/22 0821  CKTOTAL 144   CBG: Recent Labs  Lab 03/14/22 1924 03/14/22 2317 03/15/22 0320 03/15/22 0808 03/15/22 1148  GLUCAP 148* 156* 183* 187* 191*    Iron Studies: No results for input(s): "IRON", "TIBC", "TRANSFERRIN", "FERRITIN" in the last 72 hours. Studies/Results: ECHO TEE  Result Date: 03/15/2022    TRANSESOPHOGEAL ECHO REPORT   Patient Name:   Darlene Barber Date of Exam: 03/15/2022 Medical Rec #:  [id number]        Height:        58.0 in Accession #:    [id number]       Weight:       181.9 lb Date of Birth:  Feb 26, 1966       BSA:          1.749 m Patient Age:    57 years         BP:           121/83 mmHg Patient Gender: F                HR:           116 bpm. Exam Location:  Inpatient Procedure: Transesophageal Echo, Cardiac Doppler and Color Doppler Indications:     Bacteremia  History:         Patient has no prior history of Echocardiogram examinations.                  Risk Factors:Hypertension, Diabetes, Dyslipidemia, Current                  Smoker and Sleep Apnea. Septic shock, AKI.  Sonographer:     Clayton Lefort RDCS (AE) Referring Phys:  [id number] Donita Brooks Diagnosing Phys: Candee Furbish MD  Sonographer Comments: Bedside TEE at [facility]. PROCEDURE: After discussion of the risks and benefits of a TEE, an informed consent was obtained from the patient. The transesophogeal probe was passed without difficulty through the esophogus of the patient. Sedation performed by different physician. Image quality was good. The patient's vital signs; including heart rate, blood pressure, and oxygen saturation; remained stable throughout the procedure. The patient developed no complications during the procedure.  IMPRESSIONS  1. Left ventricular ejection fraction, by estimation, is 60 to 65%. The left ventricle has normal function. The left ventricle has no regional wall motion abnormalities.  2. Right ventricular systolic function is normal. The right ventricular size is normal.  3. No left atrial/left atrial appendage thrombus was detected.  4. The mitral valve is normal in structure. Trivial mitral valve regurgitation. No evidence of mitral stenosis.  5. The aortic valve is tricuspid. Aortic valve  regurgitation is not visualized. No aortic stenosis is present.  6. The inferior vena cava is normal in size with greater than 50% respiratory variability, suggesting right atrial pressure of 3 mmHg. Conclusion(s)/Recommendation(s): Normal  biventricular function without evidence of hemodynamically significant valvular heart disease. No evidence of vegetation/infective endocarditis on this transesophageael echocardiogram. FINDINGS  Left Ventricle: Left ventricular ejection fraction, by estimation, is 60 to 65%. The left ventricle has normal function. The left ventricle has no regional wall motion abnormalities. The left ventricular internal cavity size was normal in size. There is  no left ventricular hypertrophy. Right Ventricle: The right ventricular size is normal. No increase in right ventricular wall thickness. Right ventricular systolic function is normal. Left Atrium: Left atrial size was normal in size. No left atrial/left atrial appendage thrombus was detected. Right Atrium: Right atrial size was normal in size. Pericardium: There is no evidence of pericardial effusion. Mitral Valve: The mitral valve is normal in structure. Trivial mitral valve regurgitation. No evidence of mitral valve stenosis. Tricuspid Valve: The tricuspid valve is normal in structure. Tricuspid valve regurgitation is mild . No evidence of tricuspid stenosis. Aortic Valve: The aortic valve is tricuspid. Aortic valve regurgitation is not visualized. No aortic stenosis is present. Pulmonic Valve: The pulmonic valve was normal in structure. Pulmonic valve regurgitation is not visualized. No evidence of pulmonic stenosis. Aorta: The aortic root is normal in size and structure. Venous: The inferior vena cava is normal in size with greater than 50% respiratory variability, suggesting right atrial pressure of 3 mmHg. IAS/Shunts: No atrial level shunt detected by color flow Doppler. Candee Furbish MD Electronically signed by Candee Furbish MD Signature Date/Time: 03/15/2022/1:49:53 PM    Final    DG CHEST PORT 1 VIEW  Result Date: 03/15/2022 CLINICAL DATA:  Acute respiratory failure with hypoxia. EXAM: PORTABLE CHEST 1 VIEW COMPARISON:  March 14, 2022. FINDINGS: The heart size and  mediastinal contours are within normal limits. Endotracheal and feeding tubes are in grossly good position. Left internal jugular catheter is unchanged. Stable bibasilar atelectasis or infiltrates are noted. The visualized skeletal structures are unremarkable. IMPRESSION: Stable support apparatus.  Stable bibasilar opacities. Electronically Signed   By: Marijo Conception M.D.   On: 03/15/2022 08:08   DG CHEST PORT 1 VIEW  Result Date: 03/14/2022 CLINICAL DATA:  57 year old female with respiratory failure. Sepsis. EXAM: PORTABLE CHEST 1 VIEW COMPARISON:  Portable chest 03/13/2022 and earlier. FINDINGS: Portable AP semi upright view at 0440 hours. The patient remains rotated to the right. Stable endotracheal tube, left IJ central line. Enteric tube loops in the stomach and retained small volume gastric barium is unchanged. Stable lung volumes and mediastinal contours. Increased right greater than left pulmonary reticular interstitial opacity has mildly improved since 03/11/2022. No pneumothorax, pleural effusion or area of worsening ventilation. Paucity of bowel gas in the visible abdomen. IMPRESSION: 1. Stable lines and tubes. 2. Right greater than left pulmonary interstitial opacity has mildly improved since 03/11/2022. No new cardiopulmonary abnormality. Electronically Signed   By: Genevie Ann M.D.   On: 03/14/2022 06:38   Medications: Infusions:   prismasol BGK 4/2.5 400 mL/hr at 03/15/22 0606    prismasol BGK 4/2.5 400 mL/hr at 03/15/22 0606   sodium chloride Stopped (03/12/22 1305)   ampicillin (OMNIPEN) IV Stopped (03/15/22 1217)   dexmedetomidine (PRECEDEX) IV infusion 0.6 mcg/kg/hr (03/15/22 1418)   fentaNYL infusion INTRAVENOUS 275 mcg/hr (03/15/22 1400)   prismasol BGK 4/2.5 1,500 mL/hr at 03/15/22 1444    Scheduled  Medications:  arformoterol  15 mcg Nebulization BID   budesonide (PULMICORT) nebulizer solution  0.5 mg Nebulization BID   Chlorhexidine Gluconate Cloth  6 each Topical Daily    darbepoetin (ARANESP) injection - DIALYSIS  40 mcg Subcutaneous Q Thu-1800   feeding supplement (PROSource TF20)  60 mL Per Tube Daily   feeding supplement (VITAL 1.5 CAL)  1,000 mL Per Tube Q24H   insulin aspart  0-6 Units Subcutaneous Q4H   mouth rinse  15 mL Mouth Rinse Q2H   pantoprazole (PROTONIX) IV  40 mg Intravenous Daily   QUEtiapine  100 mg Per Tube QHS   revefenacin  175 mcg Nebulization Daily   sodium chloride flush  10-40 mL Intracatheter Q12H   Thrombi-Pad  1 each Topical Once   Vilazodone HCl  40 mg Per Tube Daily    have reviewed scheduled and prn medications.  Physical Exam:    General adult female in bed critically ill  HEENT normocephalic atraumatic  Neck supple trachea midline Lungs coarse mechanical breath sounds; on 60 FIO2 and PEEP 8 Heart S1S2 no rub Abdomen soft nontender nondistended Extremities no lower extremity edema and 1-2+ upper extremity edema. Fingers have peeling skin and indentations from her rings which have all been removed  Neuro - sedation currently running  Access right femoral nontunneled catheter in place   Claudia Desanctis, MD 03/15/2022,3:20 PM  LOS: 5 days

## 2022-03-15 NOTE — CV Procedure (Signed)
   Transesophageal Echocardiogram  Indications: Bacteremia  Time out performed  During this procedure the patient was administered propofol under anesthesiology supervision to achieve and maintain moderate sedation.  The patient's heart rate, blood pressure, and oxygen saturation are monitored continuously during the procedure.   Findings:  Left Ventricle: Normal ejection fraction 65%  Mitral Valve: Normal mitral valve, trivial mitral regurgitation  Aortic Valve: Normal trileaflet aortic valve no regurgitation  Tricuspid Valve: Normal tricuspid valve, mild regurgitation, normal estimated pulmonary pressures  Left Atrium: Normal, no left atrial appendage thrombus  Right Atrium: Normal  Intraatrial septum: Normal, no evidence of PFO  Bubble Contrast Study: Not performed  Impression: No evidence of valvular vegetation.  Discussed with family member.  Candee Furbish, MD

## 2022-03-15 NOTE — Progress Notes (Signed)
So, Marjory Lies, called for update on plan of care.  Reviewed plan for CT imaging and TEE.  Discussed that Cardiology will be calling for consent for TEE later, timing of testing to be determined.  Questions answered.      Noe Gens, MSN, APRN, NP-C, AGACNP-BC [facility] 03/15/2022, 9:48 AM   Please see [email or web address] for pager details.   From 7A-7P if no response, please call [phone] After hours, please call ELink [phone]

## 2022-03-15 NOTE — Consult Note (Signed)
[facility]    Date of Admission:  03/10/2022     Reason for Consult: septic shock, strep bovis bacteremia    Referring Provider: Vaughan Browner    Lines:  12/30-c left ij triple luman 01/03-c right femoral HD catheter   Abx: 1/02-c ampicillin  12/31-1/02 ceftriaxone 12/31-1/02 vanc        Assessment: Strep bovis bacteremia Septic shock Aki requiring crrt Hypoxic respiratory failure requiring intubation Polysubstance abuse (amphetamine snorting, marijuana -- denies ivdu)  57 yo female with dm2, osa, bipolar, substance abuse admitted in transfer from [facility] ed 12/30 for rash, dyspnea, found to be in septic shock with hypoxemic respiratory failure, in setting of strep infantarius bacteremia. Course complicated by aki requiring crrt  #rash (red/raised on body/extremities) Resolved I query if she has some kind of anaphylactoid reaction (hives like on presentation s/p benadryl/prednisone course and had quickly resolved) See below --> the strep infantarius bacteremia alone rather atypical to cause this severe presentation Sloughing on hands/fingers agree with primary team likely edema related-constriction with rings that had been removed  #Strep bovis bacteremia This is associated with colon cancer/endocarditis. Her presentation is rather severe for simple bactermia and I agree with primary pulm/ccm team about getting a tee  She has a noncontrast abd pelv ct on presentation that didn't demonstrate any abscess. Perhaps it was too early/noncontrast. And it is reasonable to repeat abd pelv ct with contrast to r/o deep seated abscess that had developed over the past week  12/30 bcx 2 of 2 set strep bovis (S pcn/ceftriaxone) 1/02 sputum cx (trach aspirate) ngtd 1/03 bcx ngtd  #Aki ?septic/anaphylactic vs gn #septic shock  GN is rather an early process usually seen in staph aureus endocarditis rather On crrt Nephrology following   #hcm Hiv  screen negative   Plan: Continue ampicillin -- ultimately could switch to ceftriaxone or pcn g long term if deep seated infection found Await Tee Await repeat abd pelv ct with contrast Consider outpatient colonoscopy r/o colorectal cancer Discussed with primary team   I spent 75 minute reviewing data/chart, and coordinating care and >50% direct face to face time providing counseling/discussing diagnostics/treatment plan with patient   ------------------------------------------------ Principal Problem:   Septic shock ([place]) Active Problems:   AKI (acute kidney injury) ([place])    HPI: Darlene Barber is a 57 y.o. female with dm2, osa, bipolar, substance abuse admitted in transfer from [facility] ed 12/30 for rash, dyspnea, found to be in septic shock with hypoxemic respiratory failure, in setting of strep infantarius bacteremia. Course complicated by aki requiring crrt  Case history via primary team and her nursing staff sign out, chart review, and discussion with her boyfriend who lives with her and available at bed side. Patient currently intubated/sedated  Patient was well until the day of admission when she developed acute diffuse body red/raised rash (per her boyfriend description). She also has a day of nausea and diarrhea. She usually have constipation  Her son was sick a few days prior with what appears to be some viral illness  Patient uses amphetamine off and on, and also marijuanna. Her boyfriend denies her using iv  No new medication or over the counter supplement  She initially didn't want to come for evaluation but was convinced due to spreading rash  Hospital course: Initial vitals afebrile but within 24 hours 100s; hypoxic requiring hfnc; hypotension Wbc 30; Lactate 5.4 Cr 2.6 Uds with amphetamine/marijuana Transferred to [place]  Cxr nonspecific interstitial opacity Abd pelv chest ct no abscess (noncontrast) but mention fluid in gut Admission bcx 2 set returned  strep infantarius bsAbx started   Hypoxic resp failure worsened transferred to [place] Intubated and on pressor and required crrt  Pressor off by hd #1 Remains anuric and on crrt Remains intubated and sedated Has rectal tube that showed liquid/formed stool  Rash had resolved quickly did receive steroid for 3 days and anticholinergic. Currently had hands skin sloughing/blister in setting of edema and constriction by her rings (removed)   Family History  Problem Relation Age of Onset   Hodgkin's lymphoma Mother    Heart failure Maternal Grandmother    Alzheimer's disease Maternal Grandfather    Emphysema Paternal Grandmother    Diabetes Maternal Aunt    Diabetes Maternal Uncle    Cancer Other    Breast cancer Neg Hx     Social History   Tobacco Use   Smoking status: Every Day    Packs/day: 1.00    Years: 30.00    Total pack years: 30.00    Types: Cigarettes   Smokeless tobacco: Current  Vaping Use   Vaping Use: Never used  Substance Use Topics   Alcohol use: Yes    Comment: occ once per year   Drug use: Yes    Types: Marijuana    Comment: Current use    Allergies  Allergen Reactions   [place] Other (See Comments)    Black pepper Feels like throat is closing, itchy Feels like throat is closing, itchy Feels like throat is closing, itchy   Tape Rash    Paper tape is ok to use.    Review of Systems: ROS All Other ROS was negative, except mentioned above   Past Medical History:  Diagnosis Date   Allergy    Anxiety    Bursitis of both hips    Depression    Frequent headaches    Obesity    Sleep apnea    doesn't use CPAP machine broken,        Scheduled Meds:  arformoterol  15 mcg Nebulization BID   budesonide (PULMICORT) nebulizer solution  0.5 mg Nebulization BID   Chlorhexidine Gluconate Cloth  6 each Topical Daily   darbepoetin (ARANESP) injection - DIALYSIS  40 mcg Subcutaneous Q Thu-1800   feeding supplement (PROSource TF20)  60 mL Per  Tube Daily   feeding supplement (VITAL 1.5 CAL)  1,000 mL Per Tube Q24H   mouth rinse  15 mL Mouth Rinse Q2H   pantoprazole (PROTONIX) IV  40 mg Intravenous Daily   QUEtiapine  100 mg Per Tube QHS   revefenacin  175 mcg Nebulization Daily   sodium chloride flush  10-40 mL Intracatheter Q12H   Thrombi-Pad  1 each Topical Once   Vilazodone HCl  40 mg Per Tube Daily   Continuous Infusions:   prismasol BGK 4/2.5 400 mL/hr at 03/15/22 0606    prismasol BGK 4/2.5 400 mL/hr at 03/15/22 0606   sodium chloride Stopped (03/12/22 1305)   ampicillin (OMNIPEN) IV Stopped (03/15/22 0526)   dexmedetomidine (PRECEDEX) IV infusion 0.6 mcg/kg/hr (03/15/22 0900)   fentaNYL infusion INTRAVENOUS 225 mcg/hr (03/15/22 0900)   prismasol BGK 4/2.5 1,500 mL/hr at 03/15/22 0815   PRN Meds:.acetaminophen (TYLENOL) oral liquid 160 mg/5 mL, albuterol, docusate, fentaNYL, haloperidol lactate, heparin, midazolam, ondansetron (ZOFRAN) IV, mouth rinse, polyethylene glycol, sodium chloride, sodium chloride flush   OBJECTIVE: Blood pressure (!) 182/99, pulse (!) 131, temperature (!)  100.4 F (38 C), resp. rate 18, height 4\' 10"  (1.473 m), weight 82.5 kg, SpO2 98 %.  Physical Exam  General/constitutional: ill appearing; intubated; sedated HEENT: Normocephalic, Conj Clear with some conj edema; ett in place CV: rrr no mrg Lungs: clear to auscultation, normal respiratory effort Abd: Soft, Nontender Ext: edema in bilateral hands  Skin: clear blister and skin sloughing fingers; eschar ring shape on fingers Neuro: sedated MSK: no peripheral joint swelling/tenderness/warmth GU: rectal tube in place  Central line presence: left ij and right groin hd catheter site no purulence/erythema   Lab Results Lab Results  Component Value Date   WBC 25.1 (H) 03/15/2022   HGB 9.0 (L) 03/15/2022   HCT 28.8 (L) 03/15/2022   MCV 102.1 (H) 03/15/2022   PLT 100 (L) 03/15/2022    Lab Results  Component Value Date   CREATININE  1.52 (H) 03/15/2022   BUN 26 (H) 03/15/2022   NA 138 03/15/2022   K 4.3 03/15/2022   CL 104 03/15/2022   CO2 27 03/15/2022    Lab Results  Component Value Date   ALT 26 03/14/2022   AST 38 03/14/2022   ALKPHOS 88 03/14/2022   BILITOT 0.4 03/14/2022      Microbiology: Recent Results (from the past 240 hour(s))  Resp panel by RT-PCR (RSV, Flu A&B, Covid) Anterior Nasal Swab     Status: None   Collection Time: 03/09/22  2:07 PM   Specimen: Anterior Nasal Swab  Result Value Ref Range Status   SARS Coronavirus 2 by RT PCR NEGATIVE NEGATIVE Final    Comment: (NOTE) SARS-CoV-2 target nucleic acids are NOT DETECTED.  The SARS-CoV-2 RNA is generally detectable in upper respiratory specimens during the acute phase of infection. The lowest concentration of SARS-CoV-2 viral copies this assay can detect is 138 copies/mL. A negative result does not preclude SARS-Cov-2 infection and should not be used as the sole basis for treatment or other patient management decisions. A negative result may occur with  improper specimen collection/handling, submission of specimen other than nasopharyngeal swab, presence of viral mutation(s) within the areas targeted by this assay, and inadequate number of viral copies(<138 copies/mL). A negative result must be combined with clinical observations, patient history, and epidemiological information. The expected result is Negative.  Fact Sheet for Patients:  EntrepreneurPulse.com.au  Fact Sheet for Healthcare Providers:  IncredibleEmployment.be  This test is no t yet approved or cleared by the [place] FDA and  has been authorized for detection and/or diagnosis of SARS-CoV-2 by FDA under an Emergency Use Authorization (EUA). This EUA will remain  in effect (meaning this test can be used) for the duration of the COVID-19 declaration under Section 564(b)(1) of the Act, 21 U.S.C.section 360bbb-3(b)(1), unless the  authorization is terminated  or revoked sooner.       Influenza A by PCR NEGATIVE NEGATIVE Final   Influenza B by PCR NEGATIVE NEGATIVE Final    Comment: (NOTE) The Xpert Xpress SARS-CoV-2/FLU/RSV plus assay is intended as an aid in the diagnosis of influenza from Nasopharyngeal swab specimens and should not be used as a sole basis for treatment. Nasal washings and aspirates are unacceptable for Xpert Xpress SARS-CoV-2/FLU/RSV testing.  Fact Sheet for Patients: EntrepreneurPulse.com.au  Fact Sheet for Healthcare Providers: IncredibleEmployment.be  This test is not yet approved or cleared by the [place] FDA and has been authorized for detection and/or diagnosis of SARS-CoV-2 by FDA under an Emergency Use Authorization (EUA). This EUA will remain in effect (meaning this  test can be used) for the duration of the COVID-19 declaration under Section 564(b)(1) of the Act, 21 U.S.C. section 360bbb-3(b)(1), unless the authorization is terminated or revoked.     Resp Syncytial Virus by PCR NEGATIVE NEGATIVE Final    Comment: (NOTE) Fact Sheet for Patients: EntrepreneurPulse.com.au  Fact Sheet for Healthcare Providers: IncredibleEmployment.be  This test is not yet approved or cleared by the [place] FDA and has been authorized for detection and/or diagnosis of SARS-CoV-2 by FDA under an Emergency Use Authorization (EUA). This EUA will remain in effect (meaning this test can be used) for the duration of the COVID-19 declaration under Section 564(b)(1) of the Act, 21 U.S.C. section 360bbb-3(b)(1), unless the authorization is terminated or revoked.  Performed at [facility], [address] [place], [place] [address]   Culture, blood (routine x 2)     Status: Abnormal   Collection Time: 03/09/22  2:34 PM   Specimen: BLOOD RIGHT ARM  Result Value Ref Range Status   Specimen Description    Final    BLOOD RIGHT ARM Performed at [facility], [address], [place], [place] [address]    Special Requests   Final    Blood Culture adequate volume Performed at [facility], [address] [place], [place] [address]    Culture  Setup Time   Final    GRAM POSITIVE COCCI IN BOTH AEROBIC AND ANAEROBIC BOTTLES CRITICAL RESULT CALLED TO, READ BACK BY AND VERIFIED WITH: Lloyd Huger [phone] 03/10/22 LFD [facility] RN [facility] ICU @ 7124 03/10/22 LFD Performed at [facility], [place] [address] [place], [place] [address]    Culture STREPTOCOCCUS INFANTARIUS (A)  Final   Report Status 03/12/2022 FINAL  Final   Organism ID, Bacteria STREPTOCOCCUS INFANTARIUS  Final      Susceptibility   Streptococcus infantarius - MIC*    PENICILLIN <=0.06 SENSITIVE Sensitive     CEFTRIAXONE <=0.12 SENSITIVE Sensitive     ERYTHROMYCIN <=0.12 SENSITIVE Sensitive     LEVOFLOXACIN 4 INTERMEDIATE Intermediate     VANCOMYCIN 0.5 SENSITIVE Sensitive     * STREPTOCOCCUS INFANTARIUS  Blood Culture ID Panel (Reflexed)     Status: Abnormal (Preliminary result)   Collection Time: 03/09/22  2:34 PM  Result Value Ref Range Status   Enterococcus faecalis NOT DETECTED NOT DETECTED Final   Enterococcus Faecium NOT DETECTED NOT DETECTED Final   Listeria monocytogenes NOT DETECTED NOT DETECTED Final   Staphylococcus species NOT DETECTED NOT DETECTED Final   Staphylococcus aureus (BCID) PENDING NOT DETECTED Incomplete   Staphylococcus epidermidis PENDING NOT DETECTED Incomplete   Staphylococcus lugdunensis PENDING NOT DETECTED Incomplete   Streptococcus species DETECTED (A) NOT DETECTED Final    Comment: Not Enterococcus species, Streptococcus agalactiae, Streptococcus pyogenes, or Streptococcus pneumoniae. CRITICAL RESULT CALLED TO, READ BACK BY AND VERIFIED WITH: Lloyd Huger [phone] 03/10/22 LFD    Streptococcus agalactiae NOT DETECTED NOT DETECTED Final   Streptococcus pneumoniae NOT DETECTED  NOT DETECTED Final   Streptococcus pyogenes NOT DETECTED NOT DETECTED Final   A.calcoaceticus-baumannii NOT DETECTED NOT DETECTED Final   Bacteroides fragilis NOT DETECTED NOT DETECTED Final   Enterobacterales NOT DETECTED NOT DETECTED Final   Enterobacter cloacae complex NOT DETECTED NOT DETECTED Final   Escherichia coli NOT DETECTED NOT DETECTED Final   Klebsiella aerogenes NOT DETECTED NOT DETECTED Final   Klebsiella oxytoca NOT DETECTED NOT DETECTED Final   Klebsiella pneumoniae NOT DETECTED NOT DETECTED Final   Proteus species NOT  DETECTED NOT DETECTED Final   Salmonella species NOT DETECTED NOT DETECTED Final   Serratia marcescens NOT DETECTED NOT DETECTED Final   Haemophilus influenzae NOT DETECTED NOT DETECTED Final   Neisseria meningitidis NOT DETECTED NOT DETECTED Final   Pseudomonas aeruginosa NOT DETECTED NOT DETECTED Final   Stenotrophomonas maltophilia NOT DETECTED NOT DETECTED Final   Candida albicans NOT DETECTED NOT DETECTED Final   Candida auris NOT DETECTED NOT DETECTED Final   Candida glabrata NOT DETECTED NOT DETECTED Final   Candida krusei NOT DETECTED NOT DETECTED Final   Candida parapsilosis NOT DETECTED NOT DETECTED Final   Candida tropicalis NOT DETECTED NOT DETECTED Final   Cryptococcus neoformans/gattii NOT DETECTED NOT DETECTED Final    Comment: Performed at [facility], [address] [place], [place] [address]  Culture, blood (routine x 2)     Status: Abnormal   Collection Time: 03/09/22  2:35 PM   Specimen: BLOOD RIGHT ARM  Result Value Ref Range Status   Specimen Description   Final    BLOOD RIGHT ARM Performed at [facility], [address] [place], [place] [address]    Special Requests   Final    BOTTLES DRAWN AEROBIC AND ANAEROBIC Blood Culture results may not be optimal due to an inadequate volume of blood received in culture bottles Performed at [facility], [address] [place], [place] [address]    Culture   Setup Time   Final    GRAM POSITIVE COCCI AEROBIC BOTTLE ONLY CRITICAL RESULT CALLED TO, READ BACK BY AND VERIFIED WITHLloyd Huger @ 3710 03/10/22 LFD Performed at [facility], [address] [place], [place] [address]    Culture (A)  Final    STREPTOCOCCUS INFANTARIUS SUSCEPTIBILITIES PERFORMED ON PREVIOUS CULTURE WITHIN THE LAST 5 DAYS. Performed at [facility], [place] [address], [place], [place] [address]    Report Status 03/12/2022 FINAL  Final  C Difficile Quick Screen w PCR reflex     Status: None   Collection Time: 03/09/22  5:49 PM   Specimen: STOOL  Result Value Ref Range Status   C Diff antigen NEGATIVE NEGATIVE Final   C Diff toxin NEGATIVE NEGATIVE Final   C Diff interpretation No C. difficile detected.  Final    Comment: Performed at [facility], [address] [place], [place] [address]  Gastrointestinal Panel by PCR , Stool     Status: None   Collection Time: 03/09/22  5:49 PM   Specimen: STOOL  Result Value Ref Range Status   Campylobacter species NOT DETECTED NOT DETECTED Final   Plesimonas shigelloides NOT DETECTED NOT DETECTED Final   Salmonella species NOT DETECTED NOT DETECTED Final   Yersinia enterocolitica NOT DETECTED NOT DETECTED Final   Vibrio species NOT DETECTED NOT DETECTED Final   Vibrio cholerae NOT DETECTED NOT DETECTED Final   Enteroaggregative E coli (EAEC) NOT DETECTED NOT DETECTED Final   Enteropathogenic E coli (EPEC) NOT DETECTED NOT DETECTED Final   Enterotoxigenic E coli (ETEC) NOT DETECTED NOT DETECTED Final   Shiga like toxin producing E coli (STEC) NOT DETECTED NOT DETECTED Final   Shigella/Enteroinvasive E coli (EIEC) NOT DETECTED NOT DETECTED Final   Cryptosporidium NOT DETECTED NOT DETECTED Final   Cyclospora cayetanensis NOT DETECTED NOT DETECTED Final   Entamoeba histolytica NOT DETECTED NOT DETECTED Final   Giardia lamblia NOT DETECTED NOT DETECTED Final   Adenovirus F40/41 NOT DETECTED NOT  DETECTED Final   Astrovirus NOT  DETECTED NOT DETECTED Final   Norovirus GI/GII NOT DETECTED NOT DETECTED Final   Rotavirus A NOT DETECTED NOT DETECTED Final   Sapovirus (I, II, IV, and V) NOT DETECTED NOT DETECTED Final    Comment: Performed at [facility], [address] [place], [place] [address]  MRSA Next Gen by PCR, Nasal     Status: None   Collection Time: 03/10/22  1:26 AM   Specimen: Nasal Mucosa; Nasal Swab  Result Value Ref Range Status   MRSA by PCR Next Gen NOT DETECTED NOT DETECTED Final    Comment: (NOTE) The GeneXpert MRSA Assay (FDA approved for NASAL specimens only), is one component of a comprehensive MRSA colonization surveillance program. It is not intended to diagnose MRSA infection nor to guide or monitor treatment for MRSA infections. Test performance is not FDA approved in patients less than 60 years old. Performed at [facility], [place] [address], [place], [place] [address]   Culture, Respiratory w Gram Stain     Status: None   Collection Time: 03/12/22  9:20 AM   Specimen: Tracheal Aspirate  Result Value Ref Range Status   Specimen Description   Final    TRACHEAL ASPIRATE Performed at [place] [address] [place], [place] [address]    Special Requests   Final    NONE Performed at [facility], [place] [address] [place], [place] [address]    Gram Stain NO WBC SEEN NO ORGANISMS SEEN   Final   Culture   Final    NO GROWTH 2 DAYS Performed at [facility], [place] [address], [place], [place] [address]    Report Status 03/14/2022 FINAL  Final  Culture, blood (Routine X 2) w Reflex to ID Panel     Status: None (Preliminary result)   Collection Time: 03/13/22 12:04 PM   Specimen: BLOOD LEFT HAND  Result Value Ref Range Status   Specimen Description   Final    BLOOD LEFT HAND Performed at [place] [address], [place], [place] [address]    Special  Requests   Final    BOTTLES DRAWN AEROBIC ONLY Blood Culture adequate volume Performed at [place] [address], [place], [place] [address]    Culture   Final    NO GROWTH < 24 HOURS Performed at [place] [address], [place], [place] [address]    Report Status PENDING  Incomplete  Culture, blood (Routine X 2) w Reflex to ID Panel     Status: None (Preliminary result)   Collection Time: 03/13/22 12:04 PM   Specimen: BLOOD LEFT ARM  Result Value Ref Range Status   Specimen Description   Final    BLOOD LEFT ARM Performed at [place] [address], [place], [place] [address]    Special Requests   Final    BOTTLES DRAWN AEROBIC ONLY Blood Culture adequate volume Performed at [place] [address], [place], [place] [address]    Culture   Final    NO GROWTH < 24 HOURS Performed at [place] [address], [place], [place] [address]    Report Status PENDING  Incomplete     Serology:    Imaging: If present, new imagings (plain films, ct scans, and mri) have been personally visualized and interpreted; radiology reports have been reviewed. Decision making incorporated into the Impression / Recommendations.  1/05 cxr FINDINGS: The heart size and mediastinal contours are within normal limits. Endotracheal and feeding tubes are in grossly good position. Left internal jugular catheter is unchanged. Stable bibasilar atelectasis or infiltrates are noted. The visualized skeletal structures are unremarkable.   IMPRESSION: Stable support apparatus.  Stable bibasilar opacities.   12/30 cxr Cardiomegaly. Increased interstitial markings are seen in parahilar regions and lower lung fields suggesting possible mild interstitial edema. There are linear densities in left lower lung fields suggesting subsegmental atelectasis. Possible minimal bilateral pleural effusions.   12/30  chest/abd/pelv ct without contrast 1. Fluid throughout the stomach, small bowel and colon, nonspecific but may represent a diarrheal state. No bowel obstruction, definite bowel wall thickening or pneumoperitoneum. 2. Fluid within the esophagus which may represent reflux or nonspecific esophageal motility disorder. 3. Trace amount of free pelvic fluid, nonspecific. 4. Punctate nonobstructing bilateral renal calculi. 5.  Aortic Atherosclerosis   Jabier Mutton, [facility] [place] [phone] pager    03/15/2022, 9:19 AM

## 2022-03-15 NOTE — Progress Notes (Addendum)
    [facility] has been requested to perform a transesophageal echocardiogram on [name] for bacteremia with strep infantarius.  After careful review of history and examination, the risks and benefits of transesophageal echocardiogram have been explained with patient's son Marjory Lies (medical decision maker with patient indubated) including risks of esophageal damage, perforation (1:10,000 risk), bleeding, pharyngeal hematoma as well as other potential complications associated with conscious sedation including aspiration, arrhythmia, respiratory failure and death. Alternatives to treatment were discussed, questions were answered. [name] son Marjory Lies confirmed his consent for [facility] to perform a transesophageal echocardiogram on his mother, Darlene Barber.  This conversation was witnessed by Benn Moulder.  Lily Kocher PA-C 03/15/2022 9:54 AM   Agree with above. Proceeding with TEE Candee Furbish, MD

## 2022-03-15 NOTE — TOC Progression Note (Addendum)
Transition of Care [facility]) - Progression Note    Patient Details  Name: Darlene Barber MRN: [id number] Date of Birth: 1965/05/10  Transition of [facility] CM/SW Contact  Henrietta Dine, RN Phone Number: 03/15/2022, 11:08 AM  Clinical Narrative:    Unable to complete TOC assessment; pt remains on ventilator; no family at bedside.   Expected Discharge Plan: Home/Self Care Barriers to Discharge: Continued Medical Work up  Expected Discharge Plan and Services                                               Social Determinants of Health (SDOH) Interventions SDOH Screenings   Depression (PHQ2-9): High Risk (09/03/2021)  Tobacco Use: High Risk (01/07/2022)    Readmission Risk Interventions    03/11/2022   12:18 PM  Readmission Risk Prevention Plan  Transportation Screening Complete  Medication Review ([place]) Complete  PCP or Specialist appointment within 3-5 days of discharge Complete  HRI or [place] Complete  SW Recovery Care/Counseling Consult Complete  [place] Not Applicable

## 2022-03-15 NOTE — Progress Notes (Addendum)
NAME:  Darlene Barber, MRN:  [id number], DOB:  11/06/1965, LOS: 5 ADMISSION DATE:  03/10/2022, CONSULTATION DATE:  12/30  REFERRING MD:  Dr. Cinda Quest, CHIEF COMPLAINT:  Abdominal Pain    History of Present Illness:  [name] who presented to the [facility] ER on 12/31 with reports of abdominal pain and widespread rash.  On admission, the patient significant other reported that the day after eating at a local restaurant in [place], [place] she developed abdominal pain and rash.  She was reportedly up all night with crampy abdominal pain associated with nausea and vomiting.  Around 2 PM on the day of admission she noticed a rash all over her body and had worsening pain prompting him to call EMS.  She was evaluated in the emergency room and was found to have oxygen saturations of 87% on 2 L, afebrile with an elevated white blood cell count of 29.2.  Additional labs notable for acute kidney injury, anion gap metabolic acidosis and LFTs with mixed hepatic and cholestatic picture.  Stool samples were sent with workup of acute diarrhea.  Stool pathogen panel was negative.  C. difficile toxin was negative.  She was treated with 30 mL/kg IV fluid and started on empiric broad-spectrum antibiotics with vancomycin, cefepime and Flagyl.  She remained hypotensive despite IV fluid boluses and was started on vasopressors.  She was transferred to [facility] as there were no inpatient beds at [facility].    PCCM consulted for ICU admission.  Pertinent  Medical History  Chronic Pain Syndrome  Cervical Spondylitis  GERD  Bipolar Disorder  Tobacco Abuse - daily  THC Abuse - daily   Significant Hospital Events: Including procedures, antibiotic start and stop dates in addition to other pertinent events   12/31 admit with abdominal pain, diarrhea.  Left IJ TLC placement.  CT chest abdomen pelvis with fluid throughout the stomach, small bowel and colon, nonspecific but may represent diarrheal state.  No bowel obstruction,  definite bowel wall thickening or pneumoperitoneum.  Fluid within the esophagus may represent reflux or nonspecific esophageal motility disorder.  Trace amount of free pelvic fluid, nonspecific.  UDS positive for tricyclics, THC and amphetamines.  C. difficile negative, GI PCR negative.  COVID, flu, RSV negative.  Antibiotics narrowed to Zosyn. BC positives for strep infantarius.  1/3 CRRT, PEEP 5/60% 1/4 CRRT with negative balance. Precedex added to Fentanyl.   Interim History / Subjective:  Not awake enough for wean, RN reports reducing sedation  Tmax 100.6 overnight / WBC 25.1  Cultures from 1/3 with NGTD  Vent - 60%, PEEP 8   Objective   Blood pressure 107/67, pulse (!) 102, temperature 99.9 F (37.7 C), resp. rate 16, height 4\' 10"  (1.473 m), weight 82.5 kg, SpO2 98 %.    Vent Mode: SIMV;PSV FiO2 (%):  [60 %] 60 % Set Rate:  [20 bmp] 20 bmp Vt Set:  [320 mL] 320 mL PEEP:  [8 cmH20] 8 cmH20 Pressure Support:  [8 cmH20] 8 cmH20 Plateau Pressure:  [17 cmH20-22 cmH20] 17 cmH20   Intake/Output Summary (Last 24 hours) at 03/15/2022 0904 Last data filed at 03/15/2022 0800 Gross per 24 hour  Intake 2212.94 ml  Output 4337.26 ml  Net -2124.32 ml   Filed Weights   03/12/22 0424 03/14/22 0500 03/15/22 0500  Weight: 83.9 kg 83.4 kg 82.5 kg    Examination: General: critically ill appearing adult female lying in bed on vent, appears uncomfortable  HENT: MM pink/moist, ETT, anicteric,  pupils =/reactive  Lungs: non-labored at rest, on SIMV, lungs bilaterally clear but diminished  Cardiovascular: s1s2 RRR, ST 130's, no m/r/g Abdomen: soft, non-tender, bsx4 active  Extremities: warm/dry, BUE 1+ edema  Neuro: sedate on fentanyl, no response to voice, moves all extremities spontaneously  GU: foley  Access: L IJ TLC c/d/I, R Fem HD with thrombi pad over site / dried blood   BC 1/3  NGTD >  Tracheal aspirate 1/2 > negative   Resolved Hospital Problem list   Hypotension - sepsis + sedation    Assessment & Plan:   Sepsis in setting of Streptococcal Bacteremia  Leukocytosis  Acute Diarrhea  -ampicillin based on sensitivities  -follow up blood cultures, NGTD >  -discussed with ID, this particular type of strep can be associated with colon cancer, assess CT ABD/Pelvis. ID will follow up after imaging obtained.  -assess TEE, ordered and discussed with Cards for scheduling  -follow fever curve / WBC  -hold TF for possible TEE  -assess ABG -assess CT chest   Acute Hypoxemic Respiratory Failure  Tobacco + THC Abuse  In setting of acidosis, volume overload on CXR. Note relatively clear film on admit. Had hypotension with propofol early in ICU course.  -SIMV with LTVV  -wean PEEP / FiO2  -volume removal with CRRT  -follow CXR intermittently  -continue brovana, pulmicort  -PAD protocol for RASS 0 to -1  -SBT / WUA  Rash, Hives Completed pepcid, benadryl, steroids.  -supportive care, appears to be resolving   Acute Metabolic / Toxic Encephalopathy  -PAD protocol as above  -PT efforts / Passive ROM Q4  Oliguric Renal Failure, likely ATN  Anion Gap Metabolic Acidosis with Lactic Acidosis  Elevated BUN  ? Possible interstitial process with strep -appreciate Nephrology  -CRRT for volume removal  -Trend BMP / urinary output -Replace electrolytes as indicated -Avoid nephrotoxic agents, ensure adequate renal perfusion  Thrombocytopenia  4T score 2, suspect in setting of sepsis/bacteremia, CRRT. Less likely HIT.  -follow CBC  -monitor for bleeding   Possible Melena  -follow stool output  -H/H stable   Transaminitis Shock Liver -monitor LFT's  -supportive care   UDS Positive: Amphetamines, Cannabis, TCA's  -cessation counseling when able   Bipolar Disorder Chronic Pain  -continue home vilazodone, seroquel at reduced dose   Hyperglycemia -Add very sensitive SSI   At Risk Malnutrition  -TF per Nutrition   Best Practice (right click and "Reselect all  SmartList Selections" daily)  Diet/type: tubefeeds DVT prophylaxis: SCD GI prophylaxis: PPI Lines: Central line and Dialysis Catheter Foley:  Yes, and it is still needed Code Status:  full code Last date of multidisciplinary goals of care discussion: 1/5 boyfriend updated at bedside on plan of care   Critical care time: 55 minutes    Noe Gens, MSN, APRN, NP-C, AGACNP-BC [facility] 03/15/2022, 9:04 AM   Please see [email or web address] for pager details.   From 7A-7P if no response, please call [phone] After hours, please call ELink [phone]

## 2022-03-15 NOTE — Progress Notes (Signed)
Nutrition Follow-up  DOCUMENTATION CODES:   Obesity unspecified  INTERVENTION:  - Continue goal Tube feeding via OG: Vital 1.5 at 40 ml/h (960 ml per day) Prosource TF20 60 ml daily Provides 1520 kcal, 85 gm protein, 733 ml free water daily   - Free water flushes per MD - Monitor magnesium, potassium, and phosphorus, MD to replete as needed.    NUTRITION DIAGNOSIS:   Inadequate oral intake related to inability to eat as evidenced by NPO status (on vent). *ongoing  GOAL:   Patient will meet greater than or equal to 90% of their needs *progressing, on TF  MONITOR:   Vent status, TF tolerance, Labs, Weight trends  REASON FOR ASSESSMENT:   Consult Enteral/tube feeding initiation and management (trickle TF with recs)  ASSESSMENT:   57 yo female w/ pertinent PMH HTN, T2DM, OSA, urge incontinence, bipolar depression presents to ED on 12/30 w/ rash and sob. Found to have sepsis and acute hypoxemic respiratory failure.  12/30 Admit 1/1: intubated, Vital HP at 64mL/hr started 1/2 changed to Vital 1.5 at 5mL/hr 1/3 CRRT started 1/4 advanced to goal of Vital 1.5 at 77mL/hr  Patient remains intubated and sedated. Tolerating goal TF well.     Medications reviewed and include:  Fentanyl  Labs reviewed:  Creatinine 1.52 Magnesium 2.6   Diet Order:   Diet Order             Diet NPO time specified  Diet effective now                   EDUCATION NEEDS:  Not appropriate for education at this time  Skin:  Skin Assessment: Reviewed RN Assessment Skin Integrity Issues:: Other (Comment) Other: skin tears L knee  Last BM:  1/4  Height:  Ht Readings from Last 1 Encounters:  03/12/22 4\' 10"  (1.473 m)   Weight:  Wt Readings from Last 1 Encounters:  03/15/22 82.5 kg   Ideal Body Weight:  43.9 kg  BMI:  Body mass index is 38.01 kg/m.  Estimated Nutritional Needs:  Kcal:  1500-1700 kcals Protein:  80-90 grams Fluid:  >/= 1.5L    Samson Frederic RD,  LDN For contact information, refer to [facility].

## 2022-03-16 ENCOUNTER — Inpatient Hospital Stay (HOSPITAL_COMMUNITY): Payer: Medicaid Other

## 2022-03-16 DIAGNOSIS — K529 Noninfective gastroenteritis and colitis, unspecified: Secondary | ICD-10-CM

## 2022-03-16 DIAGNOSIS — A419 Sepsis, unspecified organism: Secondary | ICD-10-CM | POA: Diagnosis not present

## 2022-03-16 DIAGNOSIS — N179 Acute kidney failure, unspecified: Secondary | ICD-10-CM | POA: Diagnosis not present

## 2022-03-16 DIAGNOSIS — R6521 Severe sepsis with septic shock: Secondary | ICD-10-CM | POA: Diagnosis not present

## 2022-03-16 DIAGNOSIS — J9601 Acute respiratory failure with hypoxia: Secondary | ICD-10-CM | POA: Diagnosis not present

## 2022-03-16 DIAGNOSIS — F17210 Nicotine dependence, cigarettes, uncomplicated: Secondary | ICD-10-CM

## 2022-03-16 LAB — GLUCOSE, CAPILLARY
Glucose-Capillary: 156 mg/dL — ABNORMAL HIGH (ref 70–99)
Glucose-Capillary: 164 mg/dL — ABNORMAL HIGH (ref 70–99)
Glucose-Capillary: 165 mg/dL — ABNORMAL HIGH (ref 70–99)
Glucose-Capillary: 173 mg/dL — ABNORMAL HIGH (ref 70–99)
Glucose-Capillary: 183 mg/dL — ABNORMAL HIGH (ref 70–99)
Glucose-Capillary: 224 mg/dL — ABNORMAL HIGH (ref 70–99)

## 2022-03-16 LAB — RENAL FUNCTION PANEL
Albumin: 2 g/dL — ABNORMAL LOW (ref 3.5–5.0)
Albumin: 1.8 g/dL — ABNORMAL LOW (ref 3.5–5.0)
Anion gap: 6 (ref 5–15)
Anion gap: 6 (ref 5–15)
BUN: 24 mg/dL — ABNORMAL HIGH (ref 6–20)
BUN: 22 mg/dL — ABNORMAL HIGH (ref 6–20)
CO2: 28 mmol/L (ref 22–32)
CO2: 27 mmol/L (ref 22–32)
Calcium: 8.4 mg/dL — ABNORMAL LOW (ref 8.9–10.3)
Calcium: 7.8 mg/dL — ABNORMAL LOW (ref 8.9–10.3)
Chloride: 103 mmol/L (ref 98–111)
Chloride: 103 mmol/L (ref 98–111)
Creatinine, Ser: 1.57 mg/dL — ABNORMAL HIGH (ref 0.44–1.00)
Creatinine, Ser: 1.63 mg/dL — ABNORMAL HIGH (ref 0.44–1.00)
GFR, Estimated: 38 mL/min — ABNORMAL LOW (ref >60)
GFR, Estimated: 37 mL/min — ABNORMAL LOW (ref >60)
Glucose, Bld: 171 mg/dL — ABNORMAL HIGH (ref 70–99)
Glucose, Bld: 171 mg/dL — ABNORMAL HIGH (ref 70–99)
Phosphorus: 3.3 mg/dL (ref 2.5–4.6)
Phosphorus: 2.9 mg/dL (ref 2.5–4.6)
Potassium: 4.2 mmol/L (ref 3.5–5.1)
Potassium: 4.9 mmol/L (ref 3.5–5.1)
Sodium: 137 mmol/L (ref 135–145)
Sodium: 136 mmol/L (ref 135–145)

## 2022-03-16 LAB — CBC
HCT: 27.2 % — ABNORMAL LOW (ref 36.0–46.0)
Hemoglobin: 8.6 g/dL — ABNORMAL LOW (ref 12.0–15.0)
MCH: 32.7 pg (ref 26.0–34.0)
MCHC: 31.6 g/dL (ref 30.0–36.0)
MCV: 103.4 fL — ABNORMAL HIGH (ref 80.0–100.0)
Platelets: 101 10*3/uL — ABNORMAL LOW (ref 150–400)
RBC: 2.63 MIL/uL — ABNORMAL LOW (ref 3.87–5.11)
RDW: 18.5 % — ABNORMAL HIGH (ref 11.5–15.5)
WBC: 32.5 10*3/uL — ABNORMAL HIGH (ref 4.0–10.5)
nRBC: 0.2 % (ref 0.0–0.2)

## 2022-03-16 LAB — MAGNESIUM: Magnesium: 2.7 mg/dL — ABNORMAL HIGH (ref 1.7–2.4)

## 2022-03-16 MED ORDER — SODIUM CHLORIDE 0.9 % IV SOLN
350.0000 [IU]/h | INTRAVENOUS | Status: DC
  Administered 2022-03-16: 350 [IU]/h via INTRAVENOUS_CENTRAL
  Filled 2022-03-16: qty 10000

## 2022-03-16 MED ORDER — VASOPRESSIN 20 UNITS/100 ML INFUSION FOR SHOCK
0.0000 [IU]/min | INTRAVENOUS | Status: DC
  Administered 2022-03-16 – 2022-03-17 (×3): 0.03 [IU]/min via INTRAVENOUS
  Filled 2022-03-16 (×3): qty 100

## 2022-03-16 MED ORDER — SODIUM CHLORIDE 0.9 % IV SOLN: INTRAVENOUS | Status: DC | PRN

## 2022-03-16 MED ORDER — LACTATED RINGERS IV BOLUS
500.0000 mL | Freq: Once | INTRAVENOUS | Status: AC
  Administered 2022-03-16: 500 mL via INTRAVENOUS

## 2022-03-16 MED ORDER — PIPERACILLIN-TAZOBACTAM 3.375 G IVPB 30 MIN
3.3750 g | Freq: Four times a day (QID) | INTRAVENOUS | Status: DC
  Administered 2022-03-16 – 2022-03-18 (×9): 3.375 g via INTRAVENOUS
  Filled 2022-03-16 (×15): qty 50

## 2022-03-16 MED ORDER — PROPOFOL 1000 MG/100ML IV EMUL
5.0000 ug/kg/min | INTRAVENOUS | Status: DC
  Administered 2022-03-16 (×2): 35 ug/kg/min via INTRAVENOUS
  Administered 2022-03-16: 5 ug/kg/min via INTRAVENOUS
  Administered 2022-03-17: 55 ug/kg/min via INTRAVENOUS
  Administered 2022-03-17: 45 ug/kg/min via INTRAVENOUS
  Administered 2022-03-17 (×2): 35 ug/kg/min via INTRAVENOUS
  Administered 2022-03-17: 25 ug/kg/min via INTRAVENOUS
  Administered 2022-03-18: 45 ug/kg/min via INTRAVENOUS
  Administered 2022-03-18: 25 ug/kg/min via INTRAVENOUS
  Administered 2022-03-18: 35 ug/kg/min via INTRAVENOUS
  Administered 2022-03-18: 25 ug/kg/min via INTRAVENOUS
  Administered 2022-03-19 – 2022-03-20 (×9): 40 ug/kg/min via INTRAVENOUS
  Administered 2022-03-21: 45 ug/kg/min via INTRAVENOUS
  Administered 2022-03-21: 15 ug/kg/min via INTRAVENOUS
  Filled 2022-03-16 (×24): qty 100

## 2022-03-16 MED ORDER — NOREPINEPHRINE 4 MG/250ML-% IV SOLN
INTRAVENOUS | Status: AC
  Administered 2022-03-16: 2 ug/min via INTRAVENOUS
  Filled 2022-03-16: qty 250

## 2022-03-16 MED ORDER — PIPERACILLIN-TAZOBACTAM 3.375 G IVPB: 3.3750 g | Freq: Three times a day (TID) | INTRAVENOUS | Status: DC

## 2022-03-16 MED ORDER — NOREPINEPHRINE 4 MG/250ML-% IV SOLN
0.0000 ug/min | INTRAVENOUS | Status: DC
  Administered 2022-03-16 – 2022-03-17 (×3): 2 ug/min via INTRAVENOUS
  Filled 2022-03-16: qty 250

## 2022-03-16 NOTE — Progress Notes (Addendum)
NAME:  Darlene Barber, MRN:  [id number], DOB:  [birth year], LOS: 6 ADMISSION DATE:  03/10/2022, CONSULTATION DATE:  12/30  REFERRING MD:  Dr. Cinda Quest, CHIEF COMPLAINT:  Abdominal Pain    History of Present Illness:  [name] who presented to the [facility] ER on 12/31 with reports of abdominal pain and widespread rash.  On admission, the patient significant other reported that the day after eating at a local restaurant in [place], [place] she developed abdominal pain and rash.  She was reportedly up all night with crampy abdominal pain associated with nausea and vomiting.  Around 2 PM on the day of admission she noticed a rash all over her body and had worsening pain prompting him to call EMS.  She was evaluated in the emergency room and was found to have oxygen saturations of 87% on 2 L, afebrile with an elevated white blood cell count of 29.2.  Additional labs notable for acute kidney injury, anion gap metabolic acidosis and LFTs with mixed hepatic and cholestatic picture.  Stool samples were sent with workup of acute diarrhea.  Stool pathogen panel was negative.  C. difficile toxin was negative.  She was treated with 30 mL/kg IV fluid and started on empiric broad-spectrum antibiotics with vancomycin, cefepime and Flagyl.  She remained hypotensive despite IV fluid boluses and was started on vasopressors.  She was transferred to [facility] as there were no inpatient beds at [facility].    PCCM consulted for ICU admission.  Pertinent  Medical History  Chronic Pain Syndrome  Cervical Spondylitis  GERD  Bipolar Disorder  Tobacco Abuse - daily  THC Abuse - daily   Significant Hospital Events: Including procedures, antibiotic start and stop dates in addition to other pertinent events   12/31 admit with abdominal pain, diarrhea.  Left IJ TLC placement.  CT chest abdomen pelvis with fluid throughout the stomach, small bowel and colon, nonspecific but may represent diarrheal state.  No bowel obstruction,  definite bowel wall thickening or pneumoperitoneum.  Fluid within the esophagus may represent reflux or nonspecific esophageal motility disorder.  Trace amount of free pelvic fluid, nonspecific.  UDS positive for tricyclics, THC and amphetamines.  C. difficile negative, GI PCR negative.  COVID, flu, RSV negative.  Antibiotics narrowed to Zosyn. BC positives for strep infantarius.  1/3 CRRT, PEEP 5/60% 1/4 CRRT with negative balance. Precedex added to Fentanyl.  1/5 CT chest abd pelvis,   Interim History / Subjective:     Objective   Blood pressure (!) 151/79, pulse (!) 121, temperature 99.7 F (37.6 C), resp. rate (!) 26, height 4\' 10"  (1.473 m), weight 79.9 kg, SpO2 93 %.    Vent Mode: PRVC FiO2 (%):  [50 %-60 %] 60 % Set Rate:  [20 bmp-26 bmp] 26 bmp Vt Set:  [320 mL-350 mL] 350 mL PEEP:  [8 cmH20] 8 cmH20 Pressure Support:  [8 cmH20] 8 cmH20 Plateau Pressure:  [15 cmH20-20 cmH20] 20 cmH20   Intake/Output Summary (Last 24 hours) at 03/16/2022 0909 Last data filed at 03/16/2022 0900 Gross per 24 hour  Intake 3180.4 ml  Output 4031.2 ml  Net -850.8 ml   Filed Weights   03/14/22 0500 03/15/22 0500 03/16/22 0500  Weight: 83.4 kg 82.5 kg 79.9 kg    Examination: Blood pressure (!) 151/79, pulse (!) 121, temperature 99.7 F (37.6 C), resp. rate (!) 26, height 4\' 10"  (1.473 m), weight 79.9 kg, SpO2 93 %. Gen:      No acute  distress HEENT:  EOMI, sclera anicteric Neck:     No masses; no thyromegaly, ET tube Lungs:    Clear to auscultation bilaterally; normal respiratory effort CV:         Regular rate and rhythm; no murmurs Abd:      + bowel sounds; soft, non-tender; no palpable masses, no distension Ext:    No edema; adequate peripheral perfusion Skin:      Warm and dry; no rash Neuro: Sedated, unresponsive  Labs/imaging reviewed Significant for glucose 171 BUN/creatinine 24/1.57 WBC 32.5, hemoglobin 8.6, platelets 101  Access: L IJ TLC c/d/I, R Fem HD with thrombi pad over  site / dried blood   BC 1/3  NGTD >  Tracheal aspirate 1/2 > negative   Resolved Hospital Problem list   Hypotension - sepsis + sedation   Assessment & Plan:   Sepsis in setting of Streptococcal Bacteremia  Leukocytosis  Acute Diarrhea  -ampicillin based on sensitivities, WBC count higher today -follow up blood cultures, NGTD >  -ID on board -No endocarditis on TEE  Acute Hypoxemic Respiratory Failure  Tobacco + THC Abuse  In setting of acidosis, volume overload on CXR. Note relatively clear film on admit. Had hypotension with propofol early in ICU course.  Continue PRVC Continue Brovana, Pulmicort Intermittent chest x-ray  Rash, Hives Completed pepcid, benadryl, steroids.  -supportive care, appears to be resolving   Acute Metabolic / Toxic Encephalopathy  -PAD protocol as above  -PT efforts / Passive ROM Q4  Oliguric Renal Failure, likely ATN  Anion Gap Metabolic Acidosis with Lactic Acidosis  Elevated BUN  ? Possible interstitial process with strep -appreciate Nephrology  -CRRT.  Will stop volume removal as blood pressure is lower -Trend BMP / urinary output -Replace electrolytes as indicated -Avoid nephrotoxic agents, ensure adequate renal perfusion  Thrombocytopenia  4T score 2, suspect in setting of sepsis/bacteremia, CRRT. Less likely HIT.  -follow CBC  -monitor for bleeding   Possible Melena  -follow stool output  -H/H stable   Transaminitis Shock Liver -monitor LFT's  -supportive care   UDS Positive: Amphetamines, Cannabis, TCA's  -cessation counseling when able   Bipolar Disorder Chronic Pain  -continue home vilazodone, seroquel at reduced dose   Hyperglycemia -Add very sensitive SSI   At Risk Malnutrition  -TF per Nutrition   Best Practice (right click and "Reselect all SmartList Selections" daily)  Diet/type: tubefeeds DVT prophylaxis: SCD GI prophylaxis: PPI Lines: Central line and Dialysis Catheter Foley:  Yes, and it is still  needed Code Status:  full code Last date of multidisciplinary goals of care discussion: 1/6 boyfriend updated at bedside on plan of care, updated son over telephone  Critical care time:    The patient is critically ill with multiple organ system failure and requires high complexity decision making for assessment and support, frequent evaluation and titration of therapies, advanced monitoring, review of radiographic studies and interpretation of complex data.   Critical Care Time devoted to patient care services, exclusive of separately billable procedures, described in this note is 35 minutes.   Marshell Garfinkel MD [facility] See Amion for pager  If no response to pager , please call [phone] until 7pm After 7:00 pm call Elink  [phone] 03/16/2022, 9:13 AM

## 2022-03-16 NOTE — Procedures (Signed)
Arterial Catheter Insertion Procedure Note  Darlene Barber  [id number]  March 21, 1965  Date:03/16/22  Time:1:30 PM    Provider Performing: Tamera Reason    Procedure: Insertion of Arterial Line [phone]) without US guidance  Indication(s) Blood pressure monitoring and/or need for frequent ABGs  Consent Risks of the procedure as well as the alternatives and risks of each were explained to the patient and/or caregiver.  Consent for the procedure was obtained and is signed in the bedside chart  Anesthesia None   Time Out Verified patient identification, verified procedure, site/side was marked, verified correct patient position, special equipment/implants available, medications/allergies/relevant history reviewed, required imaging and test results available.   Sterile Technique Maximal sterile technique including full sterile barrier drape, hand hygiene, sterile gown, sterile gloves, mask, hair covering, sterile ultrasound probe cover (if used).   Procedure Description Area of catheter insertion was cleaned with chlorhexidine and draped in sterile fashion. Without real-time ultrasound guidance an arterial catheter was placed into the left radial artery.  Appropriate arterial tracings confirmed on monitor.     Complications/Tolerance None; patient tolerated the procedure well.   EBL Minimal blood loss   Specimen(s) None

## 2022-03-16 NOTE — Progress Notes (Signed)
Renal Progress Note:   Subjective:   She had 3.8 L UF over 1/4 with CRRT.  She was anuric over 1/4.  She had been tolerating net negative 100 ml/ but now UF is off with 2 pressors on.   Review of systems:   Unable to obtain secondary to intubated and sedated   Objective Vital signs in last 24 hours: Vitals:   03/16/22 1224 03/16/22 1227 03/16/22 1230 03/16/22 1233  BP: (!) 105/53 119/66 118/62 116/61  Pulse:  (!) 103 (!) 106 (!) 108  Resp: (!) 26 (!) 26 (!) 26 (!) 26  Temp: 99.3 F (37.4 C) 99.3 F (37.4 C) 99.3 F (37.4 C) 99.3 F (37.4 C)  TempSrc:      SpO2:  96% 96% 95%  Weight:      Height:       Weight change: -2.6 kg  Intake/Output Summary (Last 24 hours) at 03/16/2022 1306 Last data filed at 03/16/2022 1300 Gross per 24 hour  Intake 3205.55 ml  Output 3440.2 ml  Net -234.65 ml    Assessment/Plan: [name] with many medical issues-  now presents with AKI in the setting of a strep bacteremia, volume depletion    1.AKI-   - crt of 0.8 in August of 2023, bland UA in October of 23.  Now with AKI , proteinuria and hematuria but also pyuria.  Seems most likely to be AKI from ATN in the setting of bacteremia and given the proteinuria and hematuria concerning for GN.  CK normal. Started CRRT on 1/2 after nontunneled catheter with critical care - low UOP, worsening azotemia, clinical worsening. ANA and ANCA negative.  C3 slightly low, C4 normal - UF goal 50 - 100 ml/hr as tolerated -> 0 on 1/6 9AM with hypotension requiring 2 pressors. - could have a strep related GN - the treatment would be supportive and directed at her infection   - No issues with filter clotting and K is within goal. Son and family bedside, updated and answered all questions.   2. Hypotension - on 2 pressors   3. Hyperkalemia-  due to AKI - improved with CRRT and now on 4K fluids for CRRT   4. Metabolic acidosis-  bicarb drip initially and now on CRRT; improved with CRRT  5. Normocytic Anemia  -  defer IV iron in setting of infection.  Aranesp 40 mcg once today and every Thursday  6. Strep bacteremia-  streptococcus infantarius. antibiotics per CCM. ID consulted.  They have recommended an additional imaging.  Avoid IV contrast if able   7. Acute hypoxic respiratory failure  - mechanical ventilation per primary team     Disposition - in ICU on CRRT    Labs: Basic Metabolic Panel: Recent Labs  Lab 03/14/22 1714 03/14/22 2100 03/15/22 0444 03/16/22 0507  NA 137 137 138 137  K 4.1 4.4 4.3 4.2  CL 105 103 104 103  CO2 26 27 27 28   GLUCOSE 163* 159* 191* 171*  BUN 26* 28* 26* 24*  CREATININE 1.72* 1.73* 1.52* 1.57*  CALCIUM 7.7* 8.2* 8.1* 8.4*  PHOS 2.3*  --  2.5 3.3   Liver Function Tests: Recent Labs  Lab 03/09/22 1350 03/11/22 1642 03/14/22 2100 03/15/22 0444 03/16/22 0507  AST 311*  --  38  --   --   ALT 122*  --  26  --   --   ALKPHOS 75  --  88  --   --   BILITOT  1.4*  --  0.4  --   --   PROT 6.3*  --  5.6*  --   --   ALBUMIN 3.0*   < > 2.2* 2.0* 2.0*   < > = values in this interval not displayed.   Recent Labs  Lab 03/09/22 2204  LIPASE 88*   No results for input(s): "AMMONIA" in the last 168 hours. CBC: Recent Labs  Lab 03/09/22 1350 03/10/22 0308 03/11/22 0539 03/12/22 0418 03/13/22 0859 03/14/22 0515 03/15/22 0444 03/16/22 0507  WBC 29.2*   < > 25.6* 18.0*  --  20.2* 25.1* 32.5*  NEUTROABS 25.2*  --   --   --   --   --   --   --   HGB 16.0*   < > 11.4* 10.4*  --  9.4* 9.0* 8.6*  HCT 48.7*   < > 33.1* 29.6*  --  28.2* 28.8* 27.2*  MCV 98.8   < > 95.4 92.5  --  96.9 102.1* 103.4*  PLT 303   < > 156 117*   < > 97* 100* 101*   < > = values in this interval not displayed.   Cardiac Enzymes: Recent Labs  Lab 03/10/22 0821  CKTOTAL 144   CBG: Recent Labs  Lab 03/15/22 1921 03/15/22 2314 03/16/22 0334 03/16/22 0742 03/16/22 1146  GLUCAP 190* 163* 173* 164* 224*    Iron Studies: No results for input(s): "IRON", "TIBC",  "TRANSFERRIN", "FERRITIN" in the last 72 hours. Studies/Results: DG Chest Port 1 View  Result Date: 03/16/2022 CLINICAL DATA:  Respiratory failure EXAM: PORTABLE CHEST 1 VIEW COMPARISON:  03/15/2022 FINDINGS: Pulmonary vascular congestion. Linear subsegmental atelectasis right base. No pneumothorax or pleural effusion. Endotracheal tube tip now 1.2 cm above carina. Left IJ CVC tip overlies distal SVC. IMPRESSION: Pulmonary vascular congestion. Right base subsegmental atelectasis. Overall improved aeration compared to the prior study. Electronically Signed   By: Sammie Bench M.D.   On: 03/16/2022 10:24   CT CHEST ABDOMEN PELVIS WO CONTRAST  Result Date: 03/15/2022 CLINICAL DATA:  Sepsis, strep bacteremia. EXAM: CT CHEST, ABDOMEN AND PELVIS WITHOUT CONTRAST TECHNIQUE: Multidetector CT imaging of the chest, abdomen and pelvis was performed following the standard protocol without IV contrast. RADIATION DOSE REDUCTION: This exam was performed according to the departmental dose-optimization program which includes automated exposure control, adjustment of the mA and/or kV according to patient size and/or use of iterative reconstruction technique. COMPARISON:  CT chest, abdomen and pelvis 03/09/2022. X-rays 03/15/2022. FINDINGS: CT CHEST FINDINGS Cardiovascular: Left IJ catheter in place. Heart is nonenlarged. No significant pericardial effusion. On this non IV contrast exam, the thoracic aorta has a normal course and caliber. Slight vascular calcification. Mediastinum/Nodes: On this non IV contrast exam there is no specific abnormal lymph node enlargement seen in the axillary region, hilum. There are some small mediastinal nodes identified which are not pathologic by size criteria, unchanged from previous. Normal caliber thoracic esophagus with enteric tube in place. There is contrast in the esophagus. Please correlate for any evidence of reflux. On the prior the esophagus was air-filled and patulous  Lungs/Pleura: ET tube in place with tip extending to the right main bronchus. Recommend this be retracted. No pneumothorax or effusion. Breathing motion is seen. Since the prior there has been development of patchy bilateral ill-defined parenchymal opacities with ground-glass components. These are diffusely distributed. Slightly more left upper and lower lobe than right. There is also an area of more dependent consolidative opacity along the right lower  lobe which is new. Atelectasis versus infiltrate. Overall recommend follow-up. Musculoskeletal: Slight curvature of the spine with scattered degenerative changes. Right-sided os acromiale. CT ABDOMEN PELVIS FINDINGS Hepatobiliary: Overall preserved liver on this noncontrast examination. No obvious mass. Previous cholecystectomy. Pancreas: Moderate pancreatic atrophy, unchanged from prior. Spleen: Spleen is nonenlarged. Adrenals/Urinary Tract: Slight thickening of the left adrenal gland is stable. The right adrenal gland is preserved. Bilateral punctate nonobstructing renal stones. No collecting system dilatation. Foley catheter in the contracted urinary bladder. Stomach/Bowel: Rectal tube in place. Large bowel is nondilated. There are several areas of wall thickening along the colon. There is increasing stranding identified and some trace ascites now seen. Please correlate for colitis. Stomach is distended with oral contrast. Enteric tube in place along the stomach. Small bowel is nondilated. No free intra-air identified at this time. Vascular/Lymphatic: Grossly normal caliber aorta and IVC with mild atherosclerotic calcifications. Right femoral line in place extending to the right common iliac vein. No definite abnormal lymph node enlargement present in the abdomen and pelvis. A few small less than 1 cm in size in short axis nodes are seen, not pathologic by size criteria. Reproductive: Status post hysterectomy. No adnexal masses. Other: Scattered skin thickening  along the anterior abdomen and pelvic wall with subcutaneous fat stranding anasarca which is increasing. There is some developing mild ascites in the pelvis and diffuse mesenteric stranding. Musculoskeletal: Mild curvature and degenerative changes along the spine. IMPRESSION: Numerous tubes and lines as above. In particular the ET tube has a tip extending to the right main bronchus. Recommend this be retracted 3 cm. Persistent areas of wall thickening along the colon diffusely with increasing stranding and trace ascites in the abdomen. Please correlate for colitis. No obstruction or free air. Interval development compared to the prior CT scan of patchy bilateral ill-defined lung opacities diffusely distributed. Slightly more left than right overall. Please correlate for infectious or inflammatory process including an atypical or viral process in the differential. Nonobstructing renal stones. Persistent patulous esophagus with some contrast in the lumen of the esophagus underdistended stomach. Please correlate for any evidence of gastroesophageal reflux. Critical Value/emergent results were called by telephone at the time of interpretation on 03/15/2022 at 4:26 pm to provider Dr. Vaughan Browner, Who verbally acknowledged these results. Electronically Signed   By: Jill Side M.D.   On: 03/15/2022 16:48   DG Abd 1 View  Result Date: 03/15/2022 CLINICAL DATA:  Encounter for OG tube placement EXAM: ABDOMEN - 1 VIEW COMPARISON:  None Available. FINDINGS: The feeding tube terminates in the region of the distal gastric body. An ETT terminates 1.1 cm above the carina. No other acute abnormalities. IMPRESSION: 1. The feeding tube terminates in the region of the distal gastric body. 2. The ETT terminates 1.1 cm above the carina. Recommend withdrawing 1 cm. These results will be called to the ordering clinician or representative by the Radiologist Assistant, and communication documented in the PACS or [employer].  Electronically Signed   By: Dorise Bullion III M.D.   On: 03/15/2022 15:00   ECHO TEE  Result Date: 03/15/2022    TRANSESOPHOGEAL ECHO REPORT   Patient Name:   MARLISSA EMERICK Date of Exam: 03/15/2022 Medical Rec #:  [id number]        Height:       58.0 in Accession #:    [id number]       Weight:       181.9 lb Date of Birth:  1965/03/23  BSA:          1.749 m Patient Age:    39 years         BP:           121/83 mmHg Patient Gender: F                HR:           116 bpm. Exam Location:  Inpatient Procedure: Transesophageal Echo, Cardiac Doppler and Color Doppler Indications:     Bacteremia  History:         Patient has no prior history of Echocardiogram examinations.                  Risk Factors:Hypertension, Diabetes, Dyslipidemia, Current                  Smoker and Sleep Apnea. Septic shock, AKI.  Sonographer:     Clayton Lefort RDCS (AE) Referring Phys:  [id number] Donita Brooks Diagnosing Phys: Candee Furbish MD  Sonographer Comments: Bedside TEE at [facility]. PROCEDURE: After discussion of the risks and benefits of a TEE, an informed consent was obtained from the patient. The transesophogeal probe was passed without difficulty through the esophogus of the patient. Sedation performed by different physician. Image quality was good. The patient's vital signs; including heart rate, blood pressure, and oxygen saturation; remained stable throughout the procedure. The patient developed no complications during the procedure.  IMPRESSIONS  1. Left ventricular ejection fraction, by estimation, is 60 to 65%. The left ventricle has normal function. The left ventricle has no regional wall motion abnormalities.  2. Right ventricular systolic function is normal. The right ventricular size is normal.  3. No left atrial/left atrial appendage thrombus was detected.  4. The mitral valve is normal in structure. Trivial mitral valve regurgitation. No evidence of mitral stenosis.  5. The aortic valve is tricuspid. Aortic valve  regurgitation is not visualized. No aortic stenosis is present.  6. The inferior vena cava is normal in size with greater than 50% respiratory variability, suggesting right atrial pressure of 3 mmHg. Conclusion(s)/Recommendation(s): Normal biventricular function without evidence of hemodynamically significant valvular heart disease. No evidence of vegetation/infective endocarditis on this transesophageael echocardiogram. FINDINGS  Left Ventricle: Left ventricular ejection fraction, by estimation, is 60 to 65%. The left ventricle has normal function. The left ventricle has no regional wall motion abnormalities. The left ventricular internal cavity size was normal in size. There is  no left ventricular hypertrophy. Right Ventricle: The right ventricular size is normal. No increase in right ventricular wall thickness. Right ventricular systolic function is normal. Left Atrium: Left atrial size was normal in size. No left atrial/left atrial appendage thrombus was detected. Right Atrium: Right atrial size was normal in size. Pericardium: There is no evidence of pericardial effusion. Mitral Valve: The mitral valve is normal in structure. Trivial mitral valve regurgitation. No evidence of mitral valve stenosis. Tricuspid Valve: The tricuspid valve is normal in structure. Tricuspid valve regurgitation is mild . No evidence of tricuspid stenosis. Aortic Valve: The aortic valve is tricuspid. Aortic valve regurgitation is not visualized. No aortic stenosis is present. Pulmonic Valve: The pulmonic valve was normal in structure. Pulmonic valve regurgitation is not visualized. No evidence of pulmonic stenosis. Aorta: The aortic root is normal in size and structure. Venous: The inferior vena cava is normal in size with greater than 50% respiratory variability, suggesting right atrial pressure of 3 mmHg. IAS/Shunts: No atrial level shunt detected by  color flow Doppler. Candee Furbish MD Electronically signed by Candee Furbish MD  Signature Date/Time: 03/15/2022/1:49:53 PM    Final    DG CHEST PORT 1 VIEW  Result Date: 03/15/2022 CLINICAL DATA:  Acute respiratory failure with hypoxia. EXAM: PORTABLE CHEST 1 VIEW COMPARISON:  March 14, 2022. FINDINGS: The heart size and mediastinal contours are within normal limits. Endotracheal and feeding tubes are in grossly good position. Left internal jugular catheter is unchanged. Stable bibasilar atelectasis or infiltrates are noted. The visualized skeletal structures are unremarkable. IMPRESSION: Stable support apparatus.  Stable bibasilar opacities. Electronically Signed   By: Marijo Conception M.D.   On: 03/15/2022 08:08   Medications: Infusions:   prismasol BGK 4/2.5 400 mL/hr at 03/16/22 0926    prismasol BGK 4/2.5 400 mL/hr at 03/16/22 0925   sodium chloride Stopped (03/16/22 1222)   sodium chloride     dexmedetomidine (PRECEDEX) IV infusion Stopped (03/16/22 1054)   fentaNYL infusion INTRAVENOUS 200 mcg/hr (03/16/22 1300)   norepinephrine (LEVOPHED) Adult infusion 9 mcg/min (03/16/22 1300)   piperacillin-tazobactam Stopped (03/16/22 1123)   prismasol BGK 4/2.5 1,500 mL/hr at 03/16/22 1251   propofol (DIPRIVAN) infusion 25 mcg/kg/min (03/16/22 1300)   vasopressin 0.03 Units/min (03/16/22 1300)    Scheduled Medications:  arformoterol  15 mcg Nebulization BID   budesonide (PULMICORT) nebulizer solution  0.5 mg Nebulization BID   Chlorhexidine Gluconate Cloth  6 each Topical Daily   darbepoetin (ARANESP) injection - DIALYSIS  40 mcg Subcutaneous Q Thu-1800   feeding supplement (PROSource TF20)  60 mL Per Tube Daily   feeding supplement (VITAL 1.5 CAL)  1,000 mL Per Tube Q24H   insulin aspart  0-6 Units Subcutaneous Q4H   mouth rinse  15 mL Mouth Rinse Q2H   pantoprazole (PROTONIX) IV  40 mg Intravenous Daily   QUEtiapine  100 mg Per Tube QHS   revefenacin  175 mcg Nebulization Daily   sodium chloride flush  10-40 mL Intracatheter Q12H   Thrombi-Pad  1 each Topical Once    Vilazodone HCl  40 mg Per Tube Daily    have reviewed scheduled and prn medications.  Physical Exam:    General adult female in bed critically ill  HEENT NCAT Neck supple trachea midline Lungs coarse mechanical breath sounds Heart S1S2 no rub Abdomen soft nontender nondistended Extremities no lower extremity edema and 1-2+ upper extremity edema. Fingers have peeling skin and indentations from her rings which have all been removed  Neuro - sedation currently running  Access right femoral nontunneled catheter in place   Darlene Barber, Hunt Oris, MD 03/16/2022,1:06 PM  LOS: 6 days

## 2022-03-16 NOTE — Progress Notes (Signed)
Subjective:  Patient intubated, sedated   Antibiotics:  Anti-infectives (From admission, onward)    Start     Dose/Rate Route Frequency Ordered Stop   03/16/22 1015  piperacillin-tazobactam (ZOSYN) IVPB 3.375 g        3.375 g 100 mL/hr over 30 Minutes Intravenous Every 6 hours 03/16/22 1007     03/16/22 1000  piperacillin-tazobactam (ZOSYN) IVPB 3.375 g  Status:  Discontinued        3.375 g 12.5 mL/hr over 240 Minutes Intravenous Every 8 hours 03/16/22 0949 03/16/22 1007   03/12/22 1800  ampicillin (OMNIPEN) 2 g in sodium chloride 0.9 % 100 mL IVPB  Status:  Discontinued        2 g 300 mL/hr over 20 Minutes Intravenous Every 6 hours 03/12/22 1629 03/16/22 0919   03/12/22 1200  ampicillin (OMNIPEN) 2 g in sodium chloride 0.9 % 100 mL IVPB  Status:  Discontinued        2 g 300 mL/hr over 20 Minutes Intravenous 2 times daily 03/12/22 0945 03/12/22 1629   03/12/22 0600  vancomycin (VANCOREADY) IVPB 500 mg/100 mL  Status:  Discontinued        500 mg 100 mL/hr over 60 Minutes Intravenous Every 48 hours 03/11/22 2256 03/11/22 2307   03/11/22 2344  vancomycin variable dose per unstable renal function (pharmacist dosing)  Status:  Discontinued         Does not apply See admin instructions 03/11/22 2344 03/12/22 0945   03/11/22 2315  vancomycin (VANCOREADY) IVPB 1750 mg/350 mL  Status:  Discontinued        1,750 mg 175 mL/hr over 120 Minutes Intravenous  Once 03/11/22 2220 03/11/22 2250   03/10/22 1200  cefTRIAXone (ROCEPHIN) 2 g in sodium chloride 0.9 % 100 mL IVPB  Status:  Discontinued        2 g 200 mL/hr over 30 Minutes Intravenous Every 24 hours 03/10/22 0934 03/12/22 0945   03/10/22 0645  vancomycin (VANCOREADY) IVPB 1500 mg/300 mL        1,500 mg 150 mL/hr over 120 Minutes Intravenous  Once 03/10/22 0547 03/10/22 0848   03/10/22 0555  vancomycin variable dose per unstable renal function (pharmacist dosing)  Status:  Discontinued         Does not apply See admin  instructions 03/10/22 0555 03/10/22 0931   03/10/22 0400  ceFEPIme (MAXIPIME) 2 g in sodium chloride 0.9 % 100 mL IVPB  Status:  Discontinued        2 g 200 mL/hr over 30 Minutes Intravenous Every 24 hours 03/10/22 0303 03/10/22 0931   03/10/22 0345  metroNIDAZOLE (FLAGYL) IVPB 500 mg  Status:  Discontinued        500 mg 100 mL/hr over 60 Minutes Intravenous Every 12 hours 03/10/22 0246 03/10/22 0931       Medications: Scheduled Meds:  arformoterol  15 mcg Nebulization BID   budesonide (PULMICORT) nebulizer solution  0.5 mg Nebulization BID   Chlorhexidine Gluconate Cloth  6 each Topical Daily   darbepoetin (ARANESP) injection - DIALYSIS  40 mcg Subcutaneous Q Thu-1800   feeding supplement (PROSource TF20)  60 mL Per Tube Daily   feeding supplement (VITAL 1.5 CAL)  1,000 mL Per Tube Q24H   insulin aspart  0-6 Units Subcutaneous Q4H   mouth rinse  15 mL Mouth Rinse Q2H   pantoprazole (PROTONIX) IV  40 mg Intravenous Daily   QUEtiapine  100 mg Per Tube QHS  revefenacin  175 mcg Nebulization Daily   sodium chloride flush  10-40 mL Intracatheter Q12H   Thrombi-Pad  1 each Topical Once   Vilazodone HCl  40 mg Per Tube Daily   Continuous Infusions:   prismasol BGK 4/2.5 400 mL/hr at 03/16/22 0926    prismasol BGK 4/2.5 400 mL/hr at 03/16/22 0925   sodium chloride Stopped (03/16/22 1222)   sodium chloride     dexmedetomidine (PRECEDEX) IV infusion Stopped (03/16/22 1054)   fentaNYL infusion INTRAVENOUS 225 mcg/hr (03/16/22 1500)   norepinephrine (LEVOPHED) Adult infusion 1 mcg/min (03/16/22 1500)   piperacillin-tazobactam Stopped (03/16/22 1123)   prismasol BGK 4/2.5 1,500 mL/hr at 03/16/22 1251   propofol (DIPRIVAN) infusion 35 mcg/kg/min (03/16/22 1500)   vasopressin 0.03 Units/min (03/16/22 1500)   PRN Meds:.Place/Maintain arterial line **AND** sodium chloride, acetaminophen (TYLENOL) oral liquid 160 mg/5 mL, albuterol, docusate, fentaNYL, haloperidol lactate, heparin,  midazolam, ondansetron (ZOFRAN) IV, mouth rinse, polyethylene glycol, sodium chloride, sodium chloride flush    Objective: Weight change: -2.6 kg  Intake/Output Summary (Last 24 hours) at 03/16/2022 1557 Last data filed at 03/16/2022 1500 Gross per 24 hour  Intake 3326.44 ml  Output 3090.2 ml  Net 236.24 ml   Blood pressure (!) 147/80, pulse (!) 105, temperature 99.1 F (37.3 C), resp. rate 20, height 4\' 10"  (1.473 m), weight 79.9 kg, SpO2 94 %. Temp:  [98.4 F (36.9 C)-99.7 F (37.6 C)] 99.1 F (37.3 C) (01/06 1500) Pulse Rate:  [94-126] 105 (01/06 1500) Resp:  [0-30] 20 (01/06 1500) BP: (62-175)/(32-86) 147/80 (01/06 1300) SpO2:  [89 %-99 %] 94 % (01/06 1500) Arterial Line BP: (106-182)/(48-97) 106/48 (01/06 1500) FiO2 (%):  [50 %-60 %] 60 % (01/06 1216) Weight:  [79.9 kg] 79.9 kg (01/06 0500)  Physical Exam: Physical Exam Constitutional:      General: She is not in acute distress.    Appearance: She is well-developed. She is not diaphoretic.     Interventions: She is intubated.  HENT:     Head: Normocephalic and atraumatic.     Right Ear: External ear normal.     Left Ear: External ear normal.     Mouth/Throat:     Pharynx: No oropharyngeal exudate.  Eyes:     General: No scleral icterus.    Conjunctiva/sclera: Conjunctivae normal.     Pupils: Pupils are equal, round, and reactive to light.  Cardiovascular:     Rate and Rhythm: Regular rhythm. Tachycardia present.  Pulmonary:     Effort: Pulmonary effort is normal. She is intubated.     Breath sounds: Normal breath sounds.  Abdominal:     General: Bowel sounds are normal. There is no distension.     Palpations: Abdomen is soft.  Musculoskeletal:        General: Normal range of motion.  Lymphadenopathy:     Cervical: No cervical adenopathy.  Skin:    General: Skin is warm and dry.     Coloration: Skin is not pale.     Findings: Rash present. No erythema.  Neurological:     General: No focal deficit present.      Mental Status: She is alert and oriented to person, place, and time.     Motor: No abnormal muscle tone.     Coordination: Coordination normal.  Psychiatric:        Mood and Affect: Mood normal.        Behavior: Behavior normal.        Thought Content: Thought content  normal.        Judgment: Judgment normal.      CBC:    BMET Recent Labs    03/15/22 0444 03/16/22 0507  NA 138 137  K 4.3 4.2  CL 104 103  CO2 27 28  GLUCOSE 191* 171*  BUN 26* 24*  CREATININE 1.52* 1.57*  CALCIUM 8.1* 8.4*     Liver Panel  Recent Labs    03/14/22 2100 03/15/22 0444 03/16/22 0507  PROT 5.6*  --   --   ALBUMIN 2.2* 2.0* 2.0*  AST 38  --   --   ALT 26  --   --   ALKPHOS 88  --   --   BILITOT 0.4  --   --        Sedimentation Rate No results for input(s): "ESRSEDRATE" in the last 72 hours. C-Reactive Protein No results for input(s): "CRP" in the last 72 hours.  Micro Results: Recent Results (from the past 720 hour(s))  Resp panel by RT-PCR (RSV, Flu A&B, Covid) Anterior Nasal Swab     Status: None   Collection Time: 03/09/22  2:07 PM   Specimen: Anterior Nasal Swab  Result Value Ref Range Status   SARS Coronavirus 2 by RT PCR NEGATIVE NEGATIVE Final    Comment: (NOTE) SARS-CoV-2 target nucleic acids are NOT DETECTED.  The SARS-CoV-2 RNA is generally detectable in upper respiratory specimens during the acute phase of infection. The lowest concentration of SARS-CoV-2 viral copies this assay can detect is 138 copies/mL. A negative result does not preclude SARS-Cov-2 infection and should not be used as the sole basis for treatment or other patient management decisions. A negative result may occur with  improper specimen collection/handling, submission of specimen other than nasopharyngeal swab, presence of viral mutation(s) within the areas targeted by this assay, and inadequate number of viral copies(<138 copies/mL). A negative result must be combined  with clinical observations, patient history, and epidemiological information. The expected result is Negative.  Fact Sheet for Patients:  EntrepreneurPulse.com.au  Fact Sheet for Healthcare Providers:  IncredibleEmployment.be  This test is no t yet approved or cleared by the [place] FDA and  has been authorized for detection and/or diagnosis of SARS-CoV-2 by FDA under an Emergency Use Authorization (EUA). This EUA will remain  in effect (meaning this test can be used) for the duration of the COVID-19 declaration under Section 564(b)(1) of the Act, 21 U.S.C.section 360bbb-3(b)(1), unless the authorization is terminated  or revoked sooner.       Influenza A by PCR NEGATIVE NEGATIVE Final   Influenza B by PCR NEGATIVE NEGATIVE Final    Comment: (NOTE) The Xpert Xpress SARS-CoV-2/FLU/RSV plus assay is intended as an aid in the diagnosis of influenza from Nasopharyngeal swab specimens and should not be used as a sole basis for treatment. Nasal washings and aspirates are unacceptable for Xpert Xpress SARS-CoV-2/FLU/RSV testing.  Fact Sheet for Patients: EntrepreneurPulse.com.au  Fact Sheet for Healthcare Providers: IncredibleEmployment.be  This test is not yet approved or cleared by the [place] FDA and has been authorized for detection and/or diagnosis of SARS-CoV-2 by FDA under an Emergency Use Authorization (EUA). This EUA will remain in effect (meaning this test can be used) for the duration of the COVID-19 declaration under Section 564(b)(1) of the Act, 21 U.S.C. section 360bbb-3(b)(1), unless the authorization is terminated or revoked.     Resp Syncytial Virus by PCR NEGATIVE NEGATIVE Final    Comment: (NOTE) Fact Sheet for Patients: EntrepreneurPulse.com.au  Fact Sheet for Healthcare Providers: IncredibleEmployment.be  This test is not yet approved  or cleared by the [place] FDA and has been authorized for detection and/or diagnosis of SARS-CoV-2 by FDA under an Emergency Use Authorization (EUA). This EUA will remain in effect (meaning this test can be used) for the duration of the COVID-19 declaration under Section 564(b)(1) of the Act, 21 U.S.C. section 360bbb-3(b)(1), unless the authorization is terminated or revoked.  Performed at [facility], [address] [place], [place] [address]   Culture, blood (routine x 2)     Status: Abnormal   Collection Time: 03/09/22  2:34 PM   Specimen: BLOOD RIGHT ARM  Result Value Ref Range Status   Specimen Description   Final    BLOOD RIGHT ARM Performed at [facility], [address] [place], [place] [address]    Special Requests   Final    Blood Culture adequate volume Performed at [facility], [address] [place], [place] [address]    Culture  Setup Time   Final    GRAM POSITIVE COCCI IN BOTH AEROBIC AND ANAEROBIC BOTTLES CRITICAL RESULT CALLED TO, READ BACK BY AND VERIFIED WITH: Lloyd Huger [id number] 03/10/22 LFD [facility] RN [facility] ICU @ 0321 03/10/22 LFD Performed at [facility], [place] [address], [place],  [address]    Culture STREPTOCOCCUS INFANTARIUS (A)  Final   Report Status 03/12/2022 FINAL  Final   Organism ID, Bacteria STREPTOCOCCUS INFANTARIUS  Final      Susceptibility   Streptococcus infantarius - MIC*    PENICILLIN <=0.06 SENSITIVE Sensitive     CEFTRIAXONE <=0.12 SENSITIVE Sensitive     ERYTHROMYCIN <=0.12 SENSITIVE Sensitive     LEVOFLOXACIN 4 INTERMEDIATE Intermediate     VANCOMYCIN 0.5 SENSITIVE Sensitive     * STREPTOCOCCUS INFANTARIUS  Blood Culture ID Panel (Reflexed)     Status: Abnormal (Preliminary result)   Collection Time: 03/09/22  2:34 PM  Result Value Ref Range Status   Enterococcus faecalis NOT DETECTED NOT DETECTED Final   Enterococcus Faecium NOT DETECTED NOT DETECTED Final   Listeria  monocytogenes NOT DETECTED NOT DETECTED Final   Staphylococcus species NOT DETECTED NOT DETECTED Final   Staphylococcus aureus (BCID) PENDING NOT DETECTED Incomplete   Staphylococcus epidermidis PENDING NOT DETECTED Incomplete   Staphylococcus lugdunensis PENDING NOT DETECTED Incomplete   Streptococcus species DETECTED (A) NOT DETECTED Final    Comment: Not Enterococcus species, Streptococcus agalactiae, Streptococcus pyogenes, or Streptococcus pneumoniae. CRITICAL RESULT CALLED TO, READ BACK BY AND VERIFIED WITH: Lloyd Huger [id number] 03/10/22 LFD    Streptococcus agalactiae NOT DETECTED NOT DETECTED Final   Streptococcus pneumoniae NOT DETECTED NOT DETECTED Final   Streptococcus pyogenes NOT DETECTED NOT DETECTED Final   A.calcoaceticus-baumannii NOT DETECTED NOT DETECTED Final   Bacteroides fragilis NOT DETECTED NOT DETECTED Final   Enterobacterales NOT DETECTED NOT DETECTED Final   Enterobacter cloacae complex NOT DETECTED NOT DETECTED Final   Escherichia coli NOT DETECTED NOT DETECTED Final   Klebsiella aerogenes NOT DETECTED NOT DETECTED Final   Klebsiella oxytoca NOT DETECTED NOT DETECTED Final   Klebsiella pneumoniae NOT DETECTED NOT DETECTED Final   Proteus species NOT DETECTED NOT DETECTED Final   Salmonella species NOT DETECTED NOT DETECTED Final   Serratia marcescens NOT DETECTED NOT DETECTED Final   Haemophilus influenzae NOT DETECTED NOT DETECTED Final   Neisseria meningitidis NOT DETECTED NOT DETECTED Final   Pseudomonas aeruginosa NOT DETECTED NOT DETECTED Final  Stenotrophomonas maltophilia NOT DETECTED NOT DETECTED Final   Candida albicans NOT DETECTED NOT DETECTED Final   Candida auris NOT DETECTED NOT DETECTED Final   Candida glabrata NOT DETECTED NOT DETECTED Final   Candida krusei NOT DETECTED NOT DETECTED Final   Candida parapsilosis NOT DETECTED NOT DETECTED Final   Candida tropicalis NOT DETECTED NOT DETECTED Final   Cryptococcus neoformans/gattii NOT DETECTED  NOT DETECTED Final    Comment: Performed at [facility], [address] [place], [place] [address]  Culture, blood (routine x 2)     Status: Abnormal   Collection Time: 03/09/22  2:35 PM   Specimen: BLOOD RIGHT ARM  Result Value Ref Range Status   Specimen Description   Final    BLOOD RIGHT ARM Performed at [facility], [place] [address] [place], [place] [address]    Special Requests   Final    BOTTLES DRAWN AEROBIC AND ANAEROBIC Blood Culture results may not be optimal due to an inadequate volume of blood received in culture bottles Performed at [facility], [address] [place], [place] [address]    Culture  Setup Time   Final    GRAM POSITIVE COCCI AEROBIC BOTTLE ONLY CRITICAL RESULT CALLED TO, READ BACK BY AND VERIFIED WITHLloyd Huger @ [phone] 03/10/22 LFD Performed at [facility], [address] [place], [place] [address]    Culture (A)  Final    STREPTOCOCCUS INFANTARIUS SUSCEPTIBILITIES PERFORMED ON PREVIOUS CULTURE WITHIN THE LAST 5 DAYS. Performed at [facility], [place] [address], [place], [place] [address]    Report Status 03/12/2022 FINAL  Final  C Difficile Quick Screen w PCR reflex     Status: None   Collection Time: 03/09/22  5:49 PM   Specimen: STOOL  Result Value Ref Range Status   C Diff antigen NEGATIVE NEGATIVE Final   C Diff toxin NEGATIVE NEGATIVE Final   C Diff interpretation No C. difficile detected.  Final    Comment: Performed at [facility], [address] [place], [place] [address]  Gastrointestinal Panel by PCR , Stool     Status: None   Collection Time: 03/09/22  5:49 PM   Specimen: STOOL  Result Value Ref Range Status   Campylobacter species NOT DETECTED NOT DETECTED Final   Plesimonas shigelloides NOT DETECTED NOT DETECTED Final   Salmonella species NOT DETECTED NOT DETECTED Final   Yersinia enterocolitica NOT DETECTED NOT DETECTED Final   Vibrio species NOT DETECTED NOT DETECTED  Final   Vibrio cholerae NOT DETECTED NOT DETECTED Final   Enteroaggregative E coli (EAEC) NOT DETECTED NOT DETECTED Final   Enteropathogenic E coli (EPEC) NOT DETECTED NOT DETECTED Final   Enterotoxigenic E coli (ETEC) NOT DETECTED NOT DETECTED Final   Shiga like toxin producing E coli (STEC) NOT DETECTED NOT DETECTED Final   Shigella/Enteroinvasive E coli (EIEC) NOT DETECTED NOT DETECTED Final   Cryptosporidium NOT DETECTED NOT DETECTED Final   Cyclospora cayetanensis NOT DETECTED NOT DETECTED Final   Entamoeba histolytica NOT DETECTED NOT DETECTED Final   Giardia lamblia NOT DETECTED NOT DETECTED Final   Adenovirus F40/41 NOT DETECTED NOT DETECTED Final   Astrovirus NOT DETECTED NOT DETECTED Final   Norovirus GI/GII NOT DETECTED NOT DETECTED Final   Rotavirus A NOT DETECTED NOT DETECTED Final   Sapovirus (I, II, IV, and V) NOT DETECTED NOT DETECTED Final    Comment: Performed at [facility], [address] [place],  [address]  MRSA Next Gen by PCR, Nasal     Status: None   Collection Time: 03/10/22  1:26 AM   Specimen: Nasal Mucosa; Nasal Swab  Result Value Ref Range Status   MRSA by PCR Next Gen NOT DETECTED NOT DETECTED Final    Comment: (NOTE) The GeneXpert MRSA Assay (FDA approved for NASAL specimens only), is one component of a comprehensive MRSA colonization surveillance program. It is not intended to diagnose MRSA infection nor to guide or monitor treatment for MRSA infections. Test performance is not FDA approved in patients less than 104 years old. Performed at [facility], [place] [place] [address], [place], [place] [address]   Culture, Respiratory w Gram Stain     Status: None   Collection Time: 03/12/22  9:20 AM   Specimen: Tracheal Aspirate  Result Value Ref Range Status   Specimen Description   Final    TRACHEAL ASPIRATE Performed at [facility] [address] [place], [place] [address]    Special Requests   Final     NONE Performed at [facility], [place] [address] [place], [place] [address]    Gram Stain NO WBC SEEN NO ORGANISMS SEEN   Final   Culture   Final    NO GROWTH 2 DAYS Performed at [facility], [place] [address] [place], [place] [address]    Report Status 03/14/2022 FINAL  Final  Culture, blood (Routine X 2) w Reflex to ID Panel     Status: None (Preliminary result)   Collection Time: 03/13/22 12:04 PM   Specimen: BLOOD LEFT HAND  Result Value Ref Range Status   Specimen Description   Final    BLOOD LEFT HAND Performed at [facility] [address], [place], [place] [address]    Special Requests   Final    BOTTLES DRAWN AEROBIC ONLY Blood Culture adequate volume Performed at [facility] [address] [place], [place] [address]    Culture   Final    NO GROWTH 2 DAYS Performed at [facility] [address], [place], [place] [address]    Report Status PENDING  Incomplete  Culture, blood (Routine X 2) w Reflex to ID Panel     Status: None (Preliminary result)   Collection Time: 03/13/22 12:04 PM   Specimen: BLOOD LEFT ARM  Result Value Ref Range Status   Specimen Description   Final    BLOOD LEFT ARM Performed at [facility] [address] [place], [place] [address]    Special Requests   Final    BOTTLES DRAWN AEROBIC ONLY Blood Culture adequate volume Performed at [facility] [address], [place], [place] [address]    Culture   Final    NO GROWTH 2 DAYS Performed at [facility] [address], [place], [place] [address]    Report Status PENDING  Incomplete    Studies/Results: DG Chest Port 1 View  Result Date: 03/16/2022 CLINICAL DATA:  Respiratory failure EXAM: PORTABLE CHEST 1 VIEW COMPARISON:  03/15/2022 FINDINGS: Pulmonary vascular congestion. Linear subsegmental atelectasis right base. No pneumothorax or pleural effusion. Endotracheal  tube tip now 1.2 cm above carina. Left IJ CVC tip overlies distal SVC. IMPRESSION: Pulmonary vascular congestion. Right base subsegmental atelectasis. Overall improved aeration compared to the prior study. Electronically Signed   By: Sammie Bench M.D.   On: 03/16/2022 10:24   CT CHEST ABDOMEN PELVIS WO  CONTRAST  Result Date: 03/15/2022 CLINICAL DATA:  Sepsis, strep bacteremia. EXAM: CT CHEST, ABDOMEN AND PELVIS WITHOUT CONTRAST TECHNIQUE: Multidetector CT imaging of the chest, abdomen and pelvis was performed following the standard protocol without IV contrast. RADIATION DOSE REDUCTION: This exam was performed according to the departmental dose-optimization program which includes automated exposure control, adjustment of the mA and/or kV according to patient size and/or use of iterative reconstruction technique. COMPARISON:  CT chest, abdomen and pelvis 03/09/2022. X-rays 03/15/2022. FINDINGS: CT CHEST FINDINGS Cardiovascular: Left IJ catheter in place. Heart is nonenlarged. No significant pericardial effusion. On this non IV contrast exam, the thoracic aorta has a normal course and caliber. Slight vascular calcification. Mediastinum/Nodes: On this non IV contrast exam there is no specific abnormal lymph node enlargement seen in the axillary region, hilum. There are some small mediastinal nodes identified which are not pathologic by size criteria, unchanged from previous. Normal caliber thoracic esophagus with enteric tube in place. There is contrast in the esophagus. Please correlate for any evidence of reflux. On the prior the esophagus was air-filled and patulous Lungs/Pleura: ET tube in place with tip extending to the right main bronchus. Recommend this be retracted. No pneumothorax or effusion. Breathing motion is seen. Since the prior there has been development of patchy bilateral ill-defined parenchymal opacities with ground-glass components. These are diffusely distributed. Slightly more left upper  and lower lobe than right. There is also an area of more dependent consolidative opacity along the right lower lobe which is new. Atelectasis versus infiltrate. Overall recommend follow-up. Musculoskeletal: Slight curvature of the spine with scattered degenerative changes. Right-sided os acromiale. CT ABDOMEN PELVIS FINDINGS Hepatobiliary: Overall preserved liver on this noncontrast examination. No obvious mass. Previous cholecystectomy. Pancreas: Moderate pancreatic atrophy, unchanged from prior. Spleen: Spleen is nonenlarged. Adrenals/Urinary Tract: Slight thickening of the left adrenal gland is stable. The right adrenal gland is preserved. Bilateral punctate nonobstructing renal stones. No collecting system dilatation. Foley catheter in the contracted urinary bladder. Stomach/Bowel: Rectal tube in place. Large bowel is nondilated. There are several areas of wall thickening along the colon. There is increasing stranding identified and some trace ascites now seen. Please correlate for colitis. Stomach is distended with oral contrast. Enteric tube in place along the stomach. Small bowel is nondilated. No free intra-air identified at this time. Vascular/Lymphatic: Grossly normal caliber aorta and IVC with mild atherosclerotic calcifications. Right femoral line in place extending to the right common iliac vein. No definite abnormal lymph node enlargement present in the abdomen and pelvis. A few small less than 1 cm in size in short axis nodes are seen, not pathologic by size criteria. Reproductive: Status post hysterectomy. No adnexal masses. Other: Scattered skin thickening along the anterior abdomen and pelvic wall with subcutaneous fat stranding anasarca which is increasing. There is some developing mild ascites in the pelvis and diffuse mesenteric stranding. Musculoskeletal: Mild curvature and degenerative changes along the spine. IMPRESSION: Numerous tubes and lines as above. In particular the ET tube has a tip  extending to the right main bronchus. Recommend this be retracted 3 cm. Persistent areas of wall thickening along the colon diffusely with increasing stranding and trace ascites in the abdomen. Please correlate for colitis. No obstruction or free air. Interval development compared to the prior CT scan of patchy bilateral ill-defined lung opacities diffusely distributed. Slightly more left than right overall. Please correlate for infectious or inflammatory process including an atypical or viral process in the differential. Nonobstructing renal stones. Persistent patulous esophagus with  some contrast in the lumen of the esophagus underdistended stomach. Please correlate for any evidence of gastroesophageal reflux. Critical Value/emergent results were called by telephone at the time of interpretation on 03/15/2022 at 4:26 pm to provider Dr. Vaughan Browner, Who verbally acknowledged these results. Electronically Signed   By: Jill Side M.D.   On: 03/15/2022 16:48   DG Abd 1 View  Result Date: 03/15/2022 CLINICAL DATA:  Encounter for OG tube placement EXAM: ABDOMEN - 1 VIEW COMPARISON:  None Available. FINDINGS: The feeding tube terminates in the region of the distal gastric body. An ETT terminates 1.1 cm above the carina. No other acute abnormalities. IMPRESSION: 1. The feeding tube terminates in the region of the distal gastric body. 2. The ETT terminates 1.1 cm above the carina. Recommend withdrawing 1 cm. These results will be called to the ordering clinician or representative by the Radiologist Assistant, and communication documented in the PACS or [employer]. Electronically Signed   By: Dorise Bullion III M.D.   On: 03/15/2022 15:00   ECHO TEE  Result Date: 03/15/2022    TRANSESOPHOGEAL ECHO REPORT   Patient Name:   Darlene Barber Date of Exam: 03/15/2022 Medical Rec #:  [id number]        Height:       58.0 in Accession #:    [id number]       Weight:       181.9 lb Date of Birth:  08/26/1965       BSA:           1.749 m Patient Age:    57 years         BP:           121/83 mmHg Patient Gender: F                HR:           116 bpm. Exam Location:  Inpatient Procedure: Transesophageal Echo, Cardiac Doppler and Color Doppler Indications:     Bacteremia  History:         Patient has no prior history of Echocardiogram examinations.                  Risk Factors:Hypertension, Diabetes, Dyslipidemia, Current                  Smoker and Sleep Apnea. Septic shock, AKI.  Sonographer:     Clayton Lefort RDCS (AE) Referring Phys:  [id number] Donita Brooks Diagnosing Phys: Candee Furbish MD  Sonographer Comments: Bedside TEE at [facility] PROCEDURE: After discussion of the risks and benefits of a TEE, an informed consent was obtained from the patient. The transesophogeal probe was passed without difficulty through the esophogus of the patient. Sedation performed by different physician. Image quality was good. The patient's vital signs; including heart rate, blood pressure, and oxygen saturation; remained stable throughout the procedure. The patient developed no complications during the procedure.  IMPRESSIONS  1. Left ventricular ejection fraction, by estimation, is 60 to 65%. The left ventricle has normal function. The left ventricle has no regional wall motion abnormalities.  2. Right ventricular systolic function is normal. The right ventricular size is normal.  3. No left atrial/left atrial appendage thrombus was detected.  4. The mitral valve is normal in structure. Trivial mitral valve regurgitation. No evidence of mitral stenosis.  5. The aortic valve is tricuspid. Aortic valve regurgitation is not visualized. No aortic stenosis is present.  6. The inferior vena  cava is normal in size with greater than 50% respiratory variability, suggesting right atrial pressure of 3 mmHg. Conclusion(s)/Recommendation(s): Normal biventricular function without evidence of hemodynamically significant valvular heart disease. No evidence of  vegetation/infective endocarditis on this transesophageael echocardiogram. FINDINGS  Left Ventricle: Left ventricular ejection fraction, by estimation, is 60 to 65%. The left ventricle has normal function. The left ventricle has no regional wall motion abnormalities. The left ventricular internal cavity size was normal in size. There is  no left ventricular hypertrophy. Right Ventricle: The right ventricular size is normal. No increase in right ventricular wall thickness. Right ventricular systolic function is normal. Left Atrium: Left atrial size was normal in size. No left atrial/left atrial appendage thrombus was detected. Right Atrium: Right atrial size was normal in size. Pericardium: There is no evidence of pericardial effusion. Mitral Valve: The mitral valve is normal in structure. Trivial mitral valve regurgitation. No evidence of mitral valve stenosis. Tricuspid Valve: The tricuspid valve is normal in structure. Tricuspid valve regurgitation is mild . No evidence of tricuspid stenosis. Aortic Valve: The aortic valve is tricuspid. Aortic valve regurgitation is not visualized. No aortic stenosis is present. Pulmonic Valve: The pulmonic valve was normal in structure. Pulmonic valve regurgitation is not visualized. No evidence of pulmonic stenosis. Aorta: The aortic root is normal in size and structure. Venous: The inferior vena cava is normal in size with greater than 50% respiratory variability, suggesting right atrial pressure of 3 mmHg. IAS/Shunts: No atrial level shunt detected by color flow Doppler. Candee Furbish MD Electronically signed by Candee Furbish MD Signature Date/Time: 03/15/2022/1:49:53 PM    Final    DG CHEST PORT 1 VIEW  Result Date: 03/15/2022 CLINICAL DATA:  Acute respiratory failure with hypoxia. EXAM: PORTABLE CHEST 1 VIEW COMPARISON:  March 14, 2022. FINDINGS: The heart size and mediastinal contours are within normal limits. Endotracheal and feeding tubes are in grossly good position. Left  internal jugular catheter is unchanged. Stable bibasilar atelectasis or infiltrates are noted. The visualized skeletal structures are unremarkable. IMPRESSION: Stable support apparatus.  Stable bibasilar opacities. Electronically Signed   By: Marijo Conception M.D.   On: 03/15/2022 08:08      Assessment/Plan:  INTERVAL HISTORY: patient remains on cooling blanket and febrile when not on it   Principal Problem:   Septic shock ([place]) Active Problems:   AKI (acute kidney injury) ([place])   Bacteremia   Rash   Acute respiratory failure with hypoxia ([facility])    Darlene Barber is a 57 y.o. female with  DM, substance abuse, admitted from [facility] with rash dyspnea in septic shock in setting of streptococcus infantarius bacteremia. She remains critically ill on ventilator and CRRT.   #1 Streptococcus infantarius--this organisms like Streptococcus gallyolyticus is in the Strep bovis which is a child with colonic pathology including colon cancer.  CT chest abdomen pelvis had shown some thickening along the colon with stranding.  Patient will definitely need a colonoscopy as an outpatient if she survives her admission.  She is being treated with ampicillin currently though I am going to broaden her to Zosyn due to her persistent fevers and new infiltrates on chest x-ray as well as findings of colitis  #2 Fevers: will broaden to zosyn to better cover GNR that could be in play in colon and to cover for potential VAP  I spent 47minutes with the patient including than 50% of the time in face to face time and non face to face time with the patient personally  reviewing CT of the pelvis chest x-ray along with review of medical records in preparation for the visit and during the visit and in coordination of her care.       LOS: 6 days   Alcide Evener 03/16/2022, 3:57 PM

## 2022-03-16 NOTE — Progress Notes (Addendum)
PHARMACY NOTE -  [name] has been assisting with dosing of Zosyn for intra-abdominal infection. Dosage remains stable at 3.375 g IV (30-min infusion) q6 hr while on CRRT and further renal adjustments per institutional Pharmacy antibiotic protocol  Pharmacy will sign off, following peripherally for culture results, dose adjustments, and length of therapy. Please reconsult if a change in clinical status warrants re-evaluation of dosage.  Reuel Boom, PharmD, BCPS [phone] 03/16/2022, 9:50 AM

## 2022-03-17 ENCOUNTER — Encounter (HOSPITAL_COMMUNITY): Payer: Self-pay | Admitting: Pulmonary Disease

## 2022-03-17 DIAGNOSIS — A419 Sepsis, unspecified organism: Secondary | ICD-10-CM | POA: Diagnosis not present

## 2022-03-17 DIAGNOSIS — J9601 Acute respiratory failure with hypoxia: Secondary | ICD-10-CM | POA: Diagnosis not present

## 2022-03-17 DIAGNOSIS — R6521 Severe sepsis with septic shock: Secondary | ICD-10-CM | POA: Diagnosis not present

## 2022-03-17 DIAGNOSIS — N179 Acute kidney failure, unspecified: Secondary | ICD-10-CM | POA: Diagnosis not present

## 2022-03-17 LAB — GLUCOSE, CAPILLARY
Glucose-Capillary: 184 mg/dL — ABNORMAL HIGH (ref 70–99)
Glucose-Capillary: 187 mg/dL — ABNORMAL HIGH (ref 70–99)
Glucose-Capillary: 198 mg/dL — ABNORMAL HIGH (ref 70–99)
Glucose-Capillary: 213 mg/dL — ABNORMAL HIGH (ref 70–99)
Glucose-Capillary: 214 mg/dL — ABNORMAL HIGH (ref 70–99)
Glucose-Capillary: 254 mg/dL — ABNORMAL HIGH (ref 70–99)

## 2022-03-17 LAB — RENAL FUNCTION PANEL
Albumin: 1.9 g/dL — ABNORMAL LOW (ref 3.5–5.0)
Albumin: 2.1 g/dL — ABNORMAL LOW (ref 3.5–5.0)
Anion gap: 6 (ref 5–15)
Anion gap: 6 (ref 5–15)
BUN: 18 mg/dL (ref 6–20)
BUN: 21 mg/dL — ABNORMAL HIGH (ref 6–20)
CO2: 28 mmol/L (ref 22–32)
CO2: 27 mmol/L (ref 22–32)
Calcium: 8.4 mg/dL — ABNORMAL LOW (ref 8.9–10.3)
Calcium: 8.2 mg/dL — ABNORMAL LOW (ref 8.9–10.3)
Chloride: 102 mmol/L (ref 98–111)
Chloride: 102 mmol/L (ref 98–111)
Creatinine, Ser: 1.46 mg/dL — ABNORMAL HIGH (ref 0.44–1.00)
Creatinine, Ser: 1.41 mg/dL — ABNORMAL HIGH (ref 0.44–1.00)
GFR, Estimated: 42 mL/min — ABNORMAL LOW (ref >60)
GFR, Estimated: 44 mL/min — ABNORMAL LOW (ref >60)
Glucose, Bld: 202 mg/dL — ABNORMAL HIGH (ref 70–99)
Glucose, Bld: 232 mg/dL — ABNORMAL HIGH (ref 70–99)
Phosphorus: 2.9 mg/dL (ref 2.5–4.6)
Phosphorus: 2.5 mg/dL (ref 2.5–4.6)
Potassium: 4.5 mmol/L (ref 3.5–5.1)
Potassium: 4.1 mmol/L (ref 3.5–5.1)
Sodium: 136 mmol/L (ref 135–145)
Sodium: 135 mmol/L (ref 135–145)

## 2022-03-17 LAB — CBC
HCT: 26.3 % — ABNORMAL LOW (ref 36.0–46.0)
Hemoglobin: 8.1 g/dL — ABNORMAL LOW (ref 12.0–15.0)
MCH: 32.3 pg (ref 26.0–34.0)
MCHC: 30.8 g/dL (ref 30.0–36.0)
MCV: 104.8 fL — ABNORMAL HIGH (ref 80.0–100.0)
Platelets: 123 10*3/uL — ABNORMAL LOW (ref 150–400)
RBC: 2.51 MIL/uL — ABNORMAL LOW (ref 3.87–5.11)
RDW: 18.6 % — ABNORMAL HIGH (ref 11.5–15.5)
WBC: 30 10*3/uL — ABNORMAL HIGH (ref 4.0–10.5)
nRBC: 0.4 % — ABNORMAL HIGH (ref 0.0–0.2)

## 2022-03-17 LAB — BASIC METABOLIC PANEL
Anion gap: 6 (ref 5–15)
BUN: 19 mg/dL (ref 6–20)
CO2: 27 mmol/L (ref 22–32)
Calcium: 8.3 mg/dL — ABNORMAL LOW (ref 8.9–10.3)
Chloride: 101 mmol/L (ref 98–111)
Creatinine, Ser: 1.46 mg/dL — ABNORMAL HIGH (ref 0.44–1.00)
GFR, Estimated: 42 mL/min — ABNORMAL LOW (ref >60)
Glucose, Bld: 198 mg/dL — ABNORMAL HIGH (ref 70–99)
Potassium: 4.5 mmol/L (ref 3.5–5.1)
Sodium: 134 mmol/L — ABNORMAL LOW (ref 135–145)

## 2022-03-17 LAB — MAGNESIUM: Magnesium: 2.6 mg/dL — ABNORMAL HIGH (ref 1.7–2.4)

## 2022-03-17 LAB — APTT: aPTT: 26 seconds (ref 24–36)

## 2022-03-17 LAB — TRIGLYCERIDES: Triglycerides: 317 mg/dL — ABNORMAL HIGH (ref <150)

## 2022-03-17 MED ORDER — CALCIUM GLUCONATE-NACL 1-0.675 GM/50ML-% IV SOLN
1.0000 g | Freq: Once | INTRAVENOUS | Status: AC
  Administered 2022-03-17: 1000 mg via INTRAVENOUS
  Filled 2022-03-17: qty 50

## 2022-03-17 MED ORDER — HEPARIN SODIUM (PORCINE) 5000 UNIT/ML IJ SOLN
5000.0000 [IU] | Freq: Three times a day (TID) | INTRAMUSCULAR | Status: DC
  Administered 2022-03-17 – 2022-03-27 (×32): 5000 [IU] via SUBCUTANEOUS
  Filled 2022-03-17 (×32): qty 1

## 2022-03-17 MED ORDER — SODIUM CHLORIDE 0.9 % IV SOLN: 1.0000 g | Freq: Once | INTRAVENOUS | Status: DC

## 2022-03-17 NOTE — Progress Notes (Signed)
Subjective:  Patient intubated sedated   Antibiotics:  Anti-infectives (From admission, onward)    Start     Dose/Rate Route Frequency Ordered Stop   03/16/22 1015  piperacillin-tazobactam (ZOSYN) IVPB 3.375 g        3.375 g 100 mL/hr over 30 Minutes Intravenous Every 6 hours 03/16/22 1007     03/16/22 1000  piperacillin-tazobactam (ZOSYN) IVPB 3.375 g  Status:  Discontinued        3.375 g 12.5 mL/hr over 240 Minutes Intravenous Every 8 hours 03/16/22 0949 03/16/22 1007   03/12/22 1800  ampicillin (OMNIPEN) 2 g in sodium chloride 0.9 % 100 mL IVPB  Status:  Discontinued        2 g 300 mL/hr over 20 Minutes Intravenous Every 6 hours 03/12/22 1629 03/16/22 0919   03/12/22 1200  ampicillin (OMNIPEN) 2 g in sodium chloride 0.9 % 100 mL IVPB  Status:  Discontinued        2 g 300 mL/hr over 20 Minutes Intravenous 2 times daily 03/12/22 0945 03/12/22 1629   03/12/22 0600  vancomycin (VANCOREADY) IVPB 500 mg/100 mL  Status:  Discontinued        500 mg 100 mL/hr over 60 Minutes Intravenous Every 48 hours 03/11/22 2256 03/11/22 2307   03/11/22 2344  vancomycin variable dose per unstable renal function (pharmacist dosing)  Status:  Discontinued         Does not apply See admin instructions 03/11/22 2344 03/12/22 0945   03/11/22 2315  vancomycin (VANCOREADY) IVPB 1750 mg/350 mL  Status:  Discontinued        1,750 mg 175 mL/hr over 120 Minutes Intravenous  Once 03/11/22 2220 03/11/22 2250   03/10/22 1200  cefTRIAXone (ROCEPHIN) 2 g in sodium chloride 0.9 % 100 mL IVPB  Status:  Discontinued        2 g 200 mL/hr over 30 Minutes Intravenous Every 24 hours 03/10/22 0934 03/12/22 0945   03/10/22 0645  vancomycin (VANCOREADY) IVPB 1500 mg/300 mL        1,500 mg 150 mL/hr over 120 Minutes Intravenous  Once 03/10/22 0547 03/10/22 0848   03/10/22 0555  vancomycin variable dose per unstable renal function (pharmacist dosing)  Status:  Discontinued         Does not apply See admin  instructions 03/10/22 0555 03/10/22 0931   03/10/22 0400  ceFEPIme (MAXIPIME) 2 g in sodium chloride 0.9 % 100 mL IVPB  Status:  Discontinued        2 g 200 mL/hr over 30 Minutes Intravenous Every 24 hours 03/10/22 0303 03/10/22 0931   03/10/22 0345  metroNIDAZOLE (FLAGYL) IVPB 500 mg  Status:  Discontinued        500 mg 100 mL/hr over 60 Minutes Intravenous Every 12 hours 03/10/22 0246 03/10/22 0931       Medications: Scheduled Meds:  arformoterol  15 mcg Nebulization BID   budesonide (PULMICORT) nebulizer solution  0.5 mg Nebulization BID   Chlorhexidine Gluconate Cloth  6 each Topical Daily   darbepoetin (ARANESP) injection - DIALYSIS  40 mcg Subcutaneous Q Thu-1800   feeding supplement (PROSource TF20)  60 mL Per Tube Daily   feeding supplement (VITAL 1.5 CAL)  1,000 mL Per Tube Q24H   insulin aspart  0-6 Units Subcutaneous Q4H   mouth rinse  15 mL Mouth Rinse Q2H   pantoprazole (PROTONIX) IV  40 mg Intravenous Daily   QUEtiapine  100 mg Per Tube QHS  revefenacin  175 mcg Nebulization Daily   sodium chloride flush  10-40 mL Intracatheter Q12H   Thrombi-Pad  1 each Topical Once   Vilazodone HCl  40 mg Per Tube Daily   Continuous Infusions:   prismasol BGK 4/2.5 400 mL/hr at 03/17/22 1112    prismasol BGK 4/2.5 400 mL/hr at 03/17/22 1112   sodium chloride Stopped (03/17/22 0701)   sodium chloride     dexmedetomidine (PRECEDEX) IV infusion Stopped (03/16/22 1054)   fentaNYL infusion INTRAVENOUS 225 mcg/hr (03/17/22 1300)   heparin 10,000 units/ 20 mL infusion syringe 350 Units/hr (03/17/22 1200)   norepinephrine (LEVOPHED) Adult infusion Stopped (03/17/22 1020)   piperacillin-tazobactam Stopped (03/17/22 1239)   prismasol BGK 4/2.5 1,500 mL/hr at 03/17/22 1238   propofol (DIPRIVAN) infusion 25 mcg/kg/min (03/17/22 1300)   vasopressin Stopped (03/17/22 0933)   PRN Meds:.Place/Maintain arterial line **AND** sodium chloride, acetaminophen (TYLENOL) oral liquid 160 mg/5 mL,  albuterol, docusate, fentaNYL, haloperidol lactate, heparin, midazolam, ondansetron (ZOFRAN) IV, mouth rinse, polyethylene glycol, sodium chloride, sodium chloride flush    Objective: Weight change:   Intake/Output Summary (Last 24 hours) at 03/17/2022 1359 Last data filed at 03/17/2022 1300 Gross per 24 hour  Intake 2882.03 ml  Output 4133.4 ml  Net -1251.37 ml    Blood pressure 135/65, pulse (!) 112, temperature 98.6 F (37 C), resp. rate (!) 0, height 4\' 10"  (1.473 m), weight 79.9 kg, SpO2 95 %. Temp:  [98.6 F (37 C)-99.1 F (37.3 C)] 98.6 F (37 C) (01/07 1300) Pulse Rate:  [90-116] 112 (01/07 1300) Resp:  [0-36] 0 (01/07 1300) BP: (101-164)/(54-96) 135/65 (01/07 1200) SpO2:  [90 %-98 %] 95 % (01/07 1300) Arterial Line BP: (93-182)/(44-97) 139/59 (01/07 1300) FiO2 (%):  [60 %] 60 % (01/07 1137)  Physical Exam: Physical Exam Constitutional:      Appearance: She is ill-appearing.     Interventions: She is intubated.  Eyes:     General:        Right eye: No discharge.        Left eye: No discharge.     Extraocular Movements: Extraocular movements intact.  Cardiovascular:     Rate and Rhythm: Tachycardia present.     Heart sounds: No murmur heard.    No friction rub. No gallop.  Pulmonary:     Effort: She is intubated.     Breath sounds: Rhonchi present.  Abdominal:     General: There is no distension.  Skin:    Findings: Bruising and rash present.  Neurological:     General: No focal deficit present.     Mental Status: She is alert.      CBC:    BMET Recent Labs    03/16/22 1855 03/17/22 0512  NA 136 134*  136  K 4.9 4.5  4.5  CL 103 101  102  CO2 27 27  28   GLUCOSE 171* 198*  202*  BUN 22* 19  18  CREATININE 1.63* 1.46*  1.46*  CALCIUM 7.8* 8.3*  8.4*      Liver Panel  Recent Labs    03/14/22 2100 03/15/22 0444 03/16/22 1855 03/17/22 0512  PROT 5.6*  --   --   --   ALBUMIN 2.2*   < > 1.8* 1.9*  AST 38  --   --   --   ALT 26   --   --   --   ALKPHOS 88  --   --   --   BILITOT  0.4  --   --   --    < > = values in this interval not displayed.        Sedimentation Rate No results for input(s): "ESRSEDRATE" in the last 72 hours. C-Reactive Protein No results for input(s): "CRP" in the last 72 hours.  Micro Results: Recent Results (from the past 720 hour(s))  Resp panel by RT-PCR (RSV, Flu A&B, Covid) Anterior Nasal Swab     Status: None   Collection Time: 03/09/22  2:07 PM   Specimen: Anterior Nasal Swab  Result Value Ref Range Status   SARS Coronavirus 2 by RT PCR NEGATIVE NEGATIVE Final    Comment: (NOTE) SARS-CoV-2 target nucleic acids are NOT DETECTED.  The SARS-CoV-2 RNA is generally detectable in upper respiratory specimens during the acute phase of infection. The lowest concentration of SARS-CoV-2 viral copies this assay can detect is 138 copies/mL. A negative result does not preclude SARS-Cov-2 infection and should not be used as the sole basis for treatment or other patient management decisions. A negative result may occur with  improper specimen collection/handling, submission of specimen other than nasopharyngeal swab, presence of viral mutation(s) within the areas targeted by this assay, and inadequate number of viral copies(<138 copies/mL). A negative result must be combined with clinical observations, patient history, and epidemiological information. The expected result is Negative.  Fact Sheet for Patients:  EntrepreneurPulse.com.au  Fact Sheet for Healthcare Providers:  IncredibleEmployment.be  This test is no t yet approved or cleared by the [place] FDA and  has been authorized for detection and/or diagnosis of SARS-CoV-2 by FDA under an Emergency Use Authorization (EUA). This EUA will remain  in effect (meaning this test can be used) for the duration of the COVID-19 declaration under Section 564(b)(1) of the Act, 21 U.S.C.section  360bbb-3(b)(1), unless the authorization is terminated  or revoked sooner.       Influenza A by PCR NEGATIVE NEGATIVE Final   Influenza B by PCR NEGATIVE NEGATIVE Final    Comment: (NOTE) The Xpert Xpress SARS-CoV-2/FLU/RSV plus assay is intended as an aid in the diagnosis of influenza from Nasopharyngeal swab specimens and should not be used as a sole basis for treatment. Nasal washings and aspirates are unacceptable for Xpert Xpress SARS-CoV-2/FLU/RSV testing.  Fact Sheet for Patients: EntrepreneurPulse.com.au  Fact Sheet for Healthcare Providers: IncredibleEmployment.be  This test is not yet approved or cleared by the [place] FDA and has been authorized for detection and/or diagnosis of SARS-CoV-2 by FDA under an Emergency Use Authorization (EUA). This EUA will remain in effect (meaning this test can be used) for the duration of the COVID-19 declaration under Section 564(b)(1) of the Act, 21 U.S.C. section 360bbb-3(b)(1), unless the authorization is terminated or revoked.     Resp Syncytial Virus by PCR NEGATIVE NEGATIVE Final    Comment: (NOTE) Fact Sheet for Patients: EntrepreneurPulse.com.au  Fact Sheet for Healthcare Providers: IncredibleEmployment.be  This test is not yet approved or cleared by the [place] FDA and has been authorized for detection and/or diagnosis of SARS-CoV-2 by FDA under an Emergency Use Authorization (EUA). This EUA will remain in effect (meaning this test can be used) for the duration of the COVID-19 declaration under Section 564(b)(1) of the Act, 21 U.S.C. section 360bbb-3(b)(1), unless the authorization is terminated or revoked.  Performed at [facility], [address] [place], [place] [address]   Culture, blood (routine x 2)     Status: Abnormal   Collection Time: 03/09/22  2:34  PM   Specimen: BLOOD RIGHT ARM  Result Value Ref Range Status    Specimen Description   Final    BLOOD RIGHT ARM Performed at [facility], [address], [place], [place] [address]    Special Requests   Final    Blood Culture adequate volume Performed at [facility], [address] [place], [place] [address]    Culture  Setup Time   Final    GRAM POSITIVE COCCI IN BOTH AEROBIC AND ANAEROBIC BOTTLES CRITICAL RESULT CALLED TO, READ BACK BY AND VERIFIED WITH: Lloyd Huger 3532 03/10/22 LFD [place] RN [facility] ICU @ 9924 03/10/22 LFD Performed at [facility], [place] [address], [place], [place] [address]    Culture STREPTOCOCCUS INFANTARIUS (A)  Final   Report Status 03/12/2022 FINAL  Final   Organism ID, Bacteria STREPTOCOCCUS INFANTARIUS  Final      Susceptibility   Streptococcus infantarius - MIC*    PENICILLIN <=0.06 SENSITIVE Sensitive     CEFTRIAXONE <=0.12 SENSITIVE Sensitive     ERYTHROMYCIN <=0.12 SENSITIVE Sensitive     LEVOFLOXACIN 4 INTERMEDIATE Intermediate     VANCOMYCIN 0.5 SENSITIVE Sensitive     * STREPTOCOCCUS INFANTARIUS  Blood Culture ID Panel (Reflexed)     Status: Abnormal (Preliminary result)   Collection Time: 03/09/22  2:34 PM  Result Value Ref Range Status   Enterococcus faecalis NOT DETECTED NOT DETECTED Final   Enterococcus Faecium NOT DETECTED NOT DETECTED Final   Listeria monocytogenes NOT DETECTED NOT DETECTED Final   Staphylococcus species NOT DETECTED NOT DETECTED Final   Staphylococcus aureus (BCID) PENDING NOT DETECTED Incomplete   Staphylococcus epidermidis PENDING NOT DETECTED Incomplete   Staphylococcus lugdunensis PENDING NOT DETECTED Incomplete   Streptococcus species DETECTED (A) NOT DETECTED Final    Comment: Not Enterococcus species, Streptococcus agalactiae, Streptococcus pyogenes, or Streptococcus pneumoniae. CRITICAL RESULT CALLED TO, READ BACK BY AND VERIFIED WITH: Lloyd Huger 1962 03/10/22 LFD    Streptococcus agalactiae NOT DETECTED NOT DETECTED Final    Streptococcus pneumoniae NOT DETECTED NOT DETECTED Final   Streptococcus pyogenes NOT DETECTED NOT DETECTED Final   A.calcoaceticus-baumannii NOT DETECTED NOT DETECTED Final   Bacteroides fragilis NOT DETECTED NOT DETECTED Final   Enterobacterales NOT DETECTED NOT DETECTED Final   Enterobacter cloacae complex NOT DETECTED NOT DETECTED Final   Escherichia coli NOT DETECTED NOT DETECTED Final   Klebsiella aerogenes NOT DETECTED NOT DETECTED Final   Klebsiella oxytoca NOT DETECTED NOT DETECTED Final   Klebsiella pneumoniae NOT DETECTED NOT DETECTED Final   Proteus species NOT DETECTED NOT DETECTED Final   Salmonella species NOT DETECTED NOT DETECTED Final   Serratia marcescens NOT DETECTED NOT DETECTED Final   Haemophilus influenzae NOT DETECTED NOT DETECTED Final   Neisseria meningitidis NOT DETECTED NOT DETECTED Final   Pseudomonas aeruginosa NOT DETECTED NOT DETECTED Final   Stenotrophomonas maltophilia NOT DETECTED NOT DETECTED Final   Candida albicans NOT DETECTED NOT DETECTED Final   Candida auris NOT DETECTED NOT DETECTED Final   Candida glabrata NOT DETECTED NOT DETECTED Final   Candida krusei NOT DETECTED NOT DETECTED Final   Candida parapsilosis NOT DETECTED NOT DETECTED Final   Candida tropicalis NOT DETECTED NOT DETECTED Final   Cryptococcus neoformans/gattii NOT DETECTED NOT DETECTED Final    Comment: Performed at [facility], [address] [place], [place] [address]  Culture, blood (routine x 2)     Status: Abnormal   Collection Time: 03/09/22  2:35 PM  Specimen: BLOOD RIGHT ARM  Result Value Ref Range Status   Specimen Description   Final    BLOOD RIGHT ARM Performed at [facility], [address] [place], [place] [address]    Special Requests   Final    BOTTLES DRAWN AEROBIC AND ANAEROBIC Blood Culture results may not be optimal due to an inadequate volume of blood received in culture bottles Performed at [facility], [address] [place], [place] [address]    Culture  Setup Time   Final    GRAM POSITIVE COCCI AEROBIC BOTTLE ONLY CRITICAL RESULT CALLED TO, READ BACK BY AND VERIFIED WITHLloyd Huger @ 6144 03/10/22 LFD Performed at [facility], [address] [place], [place] [address]    Culture (A)  Final    STREPTOCOCCUS INFANTARIUS SUSCEPTIBILITIES PERFORMED ON PREVIOUS CULTURE WITHIN THE LAST 5 DAYS. Performed at [facility], [place] [address], [place], [place] [address]    Report Status 03/12/2022 FINAL  Final  C Difficile Quick Screen w PCR reflex     Status: None   Collection Time: 03/09/22  5:49 PM   Specimen: STOOL  Result Value Ref Range Status   C Diff antigen NEGATIVE NEGATIVE Final   C Diff toxin NEGATIVE NEGATIVE Final   C Diff interpretation No C. difficile detected.  Final    Comment: Performed at [facility], [address] [place], [place] [address]  Gastrointestinal Panel by PCR , Stool     Status: None   Collection Time: 03/09/22  5:49 PM   Specimen: STOOL  Result Value Ref Range Status   Campylobacter species NOT DETECTED NOT DETECTED Final   Plesimonas shigelloides NOT DETECTED NOT DETECTED Final   Salmonella species NOT DETECTED NOT DETECTED Final   Yersinia enterocolitica NOT DETECTED NOT DETECTED Final   Vibrio species NOT DETECTED NOT DETECTED Final   Vibrio cholerae NOT DETECTED NOT DETECTED Final   Enteroaggregative E coli (EAEC) NOT DETECTED NOT DETECTED Final   Enteropathogenic E coli (EPEC) NOT DETECTED NOT DETECTED Final   Enterotoxigenic E coli (ETEC) NOT DETECTED NOT DETECTED Final   Shiga like toxin producing E coli (STEC) NOT DETECTED NOT DETECTED Final   Shigella/Enteroinvasive E coli (EIEC) NOT DETECTED NOT DETECTED Final   Cryptosporidium NOT DETECTED NOT DETECTED Final   Cyclospora cayetanensis NOT DETECTED NOT DETECTED Final   Entamoeba histolytica NOT DETECTED NOT DETECTED Final   Giardia lamblia NOT DETECTED NOT DETECTED Final    Adenovirus F40/41 NOT DETECTED NOT DETECTED Final   Astrovirus NOT DETECTED NOT DETECTED Final   Norovirus GI/GII NOT DETECTED NOT DETECTED Final   Rotavirus A NOT DETECTED NOT DETECTED Final   Sapovirus (I, II, IV, and V) NOT DETECTED NOT DETECTED Final    Comment: Performed at [facility], [address] [place], [place] [address]  MRSA Next Gen by PCR, Nasal     Status: None   Collection Time: 03/10/22  1:26 AM   Specimen: Nasal Mucosa; Nasal Swab  Result Value Ref Range Status   MRSA by PCR Next Gen NOT DETECTED NOT DETECTED Final    Comment: (NOTE) The GeneXpert MRSA Assay (FDA approved for NASAL specimens only), is one component of a comprehensive MRSA colonization surveillance program. It is not intended to diagnose MRSA infection nor to guide or monitor treatment for MRSA infections. Test performance is not FDA approved in patients less than 58 years old. Performed at [facility], [address] [place], [place] [address]   Culture, Respiratory w Gram Stain     Status: None   Collection Time: 03/12/22  9:20 AM   Specimen: Tracheal Aspirate  Result Value Ref Range Status   Specimen Description   Final    TRACHEAL ASPIRATE Performed at [place] [address], [place], [place] [address]    Special Requests   Final    NONE Performed at [facility], [place] [address] [place], [place] [address]    Gram Stain NO WBC SEEN NO ORGANISMS SEEN   Final   Culture   Final    NO GROWTH 2 DAYS Performed at [facility], [place] [address], [place], [place] [address]    Report Status 03/14/2022 FINAL  Final  Culture, blood (Routine X 2) w Reflex to ID Panel     Status: None (Preliminary result)   Collection Time: 03/13/22 12:04 PM   Specimen: BLOOD LEFT HAND  Result Value Ref Range Status   Specimen Description   Final    BLOOD LEFT HAND Performed at [place] [address]  [place], [place] [address]    Special Requests   Final    BOTTLES DRAWN AEROBIC ONLY Blood Culture adequate volume Performed at [place] [address] [place], [place] [address]    Culture   Final    NO GROWTH 4 DAYS Performed at [facility], [place] [address] [place], [place] [address]    Report Status PENDING  Incomplete  Culture, blood (Routine X 2) w Reflex to ID Panel     Status: None (Preliminary result)   Collection Time: 03/13/22 12:04 PM   Specimen: BLOOD LEFT ARM  Result Value Ref Range Status   Specimen Description   Final    BLOOD LEFT ARM Performed at [place] [address], [place], [place] [address]    Special Requests   Final    BOTTLES DRAWN AEROBIC ONLY Blood Culture adequate volume Performed at [place] [address] [place],  [address]    Culture   Final    NO GROWTH 4 DAYS Performed at [facility], [place] [address] [place],  [address]    Report Status PENDING  Incomplete    Studies/Results: DG Chest Port 1 View  Result Date: 03/16/2022 CLINICAL DATA:  Respiratory failure EXAM: PORTABLE CHEST 1 VIEW COMPARISON:  03/15/2022 FINDINGS: Pulmonary vascular congestion. Linear subsegmental atelectasis right base. No pneumothorax or pleural effusion. Endotracheal tube tip now 1.2 cm above carina. Left IJ CVC tip overlies distal SVC. IMPRESSION: Pulmonary vascular congestion. Right base subsegmental atelectasis. Overall improved aeration compared to the prior study. Electronically Signed   By: Sammie Bench M.D.   On: 03/16/2022 10:24   CT CHEST ABDOMEN PELVIS WO CONTRAST  Result Date: 03/15/2022 CLINICAL DATA:  Sepsis, strep bacteremia. EXAM: CT CHEST, ABDOMEN AND PELVIS WITHOUT CONTRAST TECHNIQUE: Multidetector CT imaging of the chest, abdomen and pelvis was performed following the standard protocol without IV contrast. RADIATION DOSE REDUCTION: This exam was performed  according to the departmental dose-optimization program which includes automated exposure control, adjustment of the mA and/or kV according to patient size and/or use of iterative reconstruction technique. COMPARISON:  CT chest, abdomen and pelvis 03/09/2022. X-rays 03/15/2022. FINDINGS: CT CHEST FINDINGS Cardiovascular: Left IJ catheter in place. Heart is nonenlarged. No significant pericardial effusion. On this non IV contrast exam, the thoracic  aorta has a normal course and caliber. Slight vascular calcification. Mediastinum/Nodes: On this non IV contrast exam there is no specific abnormal lymph node enlargement seen in the axillary region, hilum. There are some small mediastinal nodes identified which are not pathologic by size criteria, unchanged from previous. Normal caliber thoracic esophagus with enteric tube in place. There is contrast in the esophagus. Please correlate for any evidence of reflux. On the prior the esophagus was air-filled and patulous Lungs/Pleura: ET tube in place with tip extending to the right main bronchus. Recommend this be retracted. No pneumothorax or effusion. Breathing motion is seen. Since the prior there has been development of patchy bilateral ill-defined parenchymal opacities with ground-glass components. These are diffusely distributed. Slightly more left upper and lower lobe than right. There is also an area of more dependent consolidative opacity along the right lower lobe which is new. Atelectasis versus infiltrate. Overall recommend follow-up. Musculoskeletal: Slight curvature of the spine with scattered degenerative changes. Right-sided os acromiale. CT ABDOMEN PELVIS FINDINGS Hepatobiliary: Overall preserved liver on this noncontrast examination. No obvious mass. Previous cholecystectomy. Pancreas: Moderate pancreatic atrophy, unchanged from prior. Spleen: Spleen is nonenlarged. Adrenals/Urinary Tract: Slight thickening of the left adrenal gland is stable. The right  adrenal gland is preserved. Bilateral punctate nonobstructing renal stones. No collecting system dilatation. Foley catheter in the contracted urinary bladder. Stomach/Bowel: Rectal tube in place. Large bowel is nondilated. There are several areas of wall thickening along the colon. There is increasing stranding identified and some trace ascites now seen. Please correlate for colitis. Stomach is distended with oral contrast. Enteric tube in place along the stomach. Small bowel is nondilated. No free intra-air identified at this time. Vascular/Lymphatic: Grossly normal caliber aorta and IVC with mild atherosclerotic calcifications. Right femoral line in place extending to the right common iliac vein. No definite abnormal lymph node enlargement present in the abdomen and pelvis. A few small less than 1 cm in size in short axis nodes are seen, not pathologic by size criteria. Reproductive: Status post hysterectomy. No adnexal masses. Other: Scattered skin thickening along the anterior abdomen and pelvic wall with subcutaneous fat stranding anasarca which is increasing. There is some developing mild ascites in the pelvis and diffuse mesenteric stranding. Musculoskeletal: Mild curvature and degenerative changes along the spine. IMPRESSION: Numerous tubes and lines as above. In particular the ET tube has a tip extending to the right main bronchus. Recommend this be retracted 3 cm. Persistent areas of wall thickening along the colon diffusely with increasing stranding and trace ascites in the abdomen. Please correlate for colitis. No obstruction or free air. Interval development compared to the prior CT scan of patchy bilateral ill-defined lung opacities diffusely distributed. Slightly more left than right overall. Please correlate for infectious or inflammatory process including an atypical or viral process in the differential. Nonobstructing renal stones. Persistent patulous esophagus with some contrast in the lumen of  the esophagus underdistended stomach. Please correlate for any evidence of gastroesophageal reflux. Critical Value/emergent results were called by telephone at the time of interpretation on 03/15/2022 at 4:26 pm to provider Dr. Vaughan Browner, Who verbally acknowledged these results. Electronically Signed   By: Jill Side M.D.   On: 03/15/2022 16:48   DG Abd 1 View  Result Date: 03/15/2022 CLINICAL DATA:  Encounter for OG tube placement EXAM: ABDOMEN - 1 VIEW COMPARISON:  None Available. FINDINGS: The feeding tube terminates in the region of the distal gastric body. An ETT terminates 1.1 cm above the carina. No  other acute abnormalities. IMPRESSION: 1. The feeding tube terminates in the region of the distal gastric body. 2. The ETT terminates 1.1 cm above the carina. Recommend withdrawing 1 cm. These results will be called to the ordering clinician or representative by the Radiologist Assistant, and communication documented in the PACS or [employer]. Electronically Signed   By: Dorise Bullion III M.D.   On: 03/15/2022 15:00      Assessment/Plan:  INTERVAL HISTORY:   Patient hemodynamically improved and improved neurologically still on CRRT  Principal Problem:   Septic shock ([place]) Active Problems:   AKI (acute kidney injury) ([place])   Bacteremia   Rash   Acute respiratory failure with hypoxia ([place])    Darlene Barber is a 57 y.o. female with  DM, substance abuse, admitted from [facility] with rash dyspnea in septic shock in setting of streptococcus infantarius bacteremia. She remains critically ill on ventilator and CRRT.   #1 Streptococcus infantarius--this organisms like Streptococcus gallyolyticus is in the Strep bovis which is a child with colonic pathology including colon cancer It is also notoroius for causing infectious endocarditis  CT chest abdomen pelvis had shown some thickening along the colon with stranding.  Patient will definitely need a colonoscopy as an outpatient if she  survives her admission.  Switched her from ampicillin to Zosyn to cover potential pathogens in the colon given the findings of colitis on CT and potential Vap  #2 Rash: stab;e  #3 Renal failure remains on CRRT  #4 Goals of care: family meeting tomorrow.  I spent 52 minutes with the patient including than 50% of the time in face to face and non face to face time  personally reviewing CXR along with review of medical records in preparation for the visit and during the visit and in coordination of her care.  Dr. West Bali is back tomorrow.   LOS: 7 days   Alcide Evener 03/17/2022, 1:59 PM

## 2022-03-17 NOTE — IPAL (Signed)
  Interdisciplinary Goals of Care Family Meeting   Date carried out: 03/17/2022  Location of the meeting: Conference room  Member's involved: Physician and Family Member or next of kin  Durable Power of Attorney or Loss adjuster, chartered: Discussed with extended family including son and healthcare POA Cullen Vanallen  Discussion: We discussed goals of care for [employer] .   We discussed her clinical course and presentation septic shock, multiorgan failure.  I am concerned that she has not made much progress and prognosis for recovery is poor.  We discussed DNR status but family is not ready to make this decision yet.  They have requested to continue full support for now and we will meet again around Wednesday or Thursday to readdress goals of care if there is no progress  Code status:   Code Status: Full Code   Disposition: Continue current acute care  Time spent for the meeting: [place] MD [facility] See Amion for pager  If no response to pager , please call [phone] until 7pm After 7:00 pm call Elink  [phone] 03/17/2022, 3:25 PM

## 2022-03-17 NOTE — Progress Notes (Addendum)
NAME:  Darlene Barber, MRN:  [id number], DOB:  1965/12/28, LOS: 7 ADMISSION DATE:  03/10/2022, CONSULTATION DATE:  12/30  REFERRING MD:  Dr. Cinda Quest, CHIEF COMPLAINT:  Abdominal Pain    History of Present Illness:  [name] who presented to the [facility] ER on 12/31 with reports of abdominal pain and widespread rash.  On admission, the patient significant other reported that the day after eating at a local restaurant in [place], [place] she developed abdominal pain and rash.  She was reportedly up all night with crampy abdominal pain associated with nausea and vomiting.  Around 2 PM on the day of admission she noticed a rash all over her body and had worsening pain prompting him to call EMS.  She was evaluated in the emergency room and was found to have oxygen saturations of 87% on 2 L, afebrile with an elevated white blood cell count of 29.2.  Additional labs notable for acute kidney injury, anion gap metabolic acidosis and LFTs with mixed hepatic and cholestatic picture.  Stool samples were sent with workup of acute diarrhea.  Stool pathogen panel was negative.  C. difficile toxin was negative.  She was treated with 30 mL/kg IV fluid and started on empiric broad-spectrum antibiotics with vancomycin, cefepime and Flagyl.  She remained hypotensive despite IV fluid boluses and was started on vasopressors.  She was transferred to [facility] as there were no inpatient beds at [facility].    PCCM consulted for ICU admission.  Pertinent  Medical History  Chronic Pain Syndrome  Cervical Spondylitis  GERD  Bipolar Disorder  Tobacco Abuse - daily  THC Abuse - daily   Significant Hospital Events: Including procedures, antibiotic start and stop dates in addition to other pertinent events   12/31 admit with abdominal pain, diarrhea.  Left IJ TLC placement.  CT chest abdomen pelvis with fluid throughout the stomach, small bowel and colon, nonspecific but may represent diarrheal state.  No bowel obstruction,  definite bowel wall thickening or pneumoperitoneum.  Fluid within the esophagus may represent reflux or nonspecific esophageal motility disorder.  Trace amount of free pelvic fluid, nonspecific.  UDS positive for tricyclics, THC and amphetamines.  C. difficile negative, GI PCR negative.  COVID, flu, RSV negative.  Antibiotics narrowed to Zosyn. BC positives for strep infantarius.  1/3 CRRT, PEEP 5/60% 1/4 CRRT with negative balance. Precedex added to Fentanyl.  1/5 CT chest abd pelvis, TEE done with no evidence of vegetations  Interim History / Subjective:   Started on norepinephrine and vasopressin drips 1/6  for hypotension.  A-line placed BP better today and presssors are weaning down  Objective   Blood pressure 113/65, pulse 90, temperature 98.6 F (37 C), resp. rate (!) 0, height 4\' 10"  (1.473 m), weight 79.9 kg, SpO2 98 %.    Vent Mode: PRVC FiO2 (%):  [60 %] 60 % Set Rate:  [26 bmp] 26 bmp Vt Set:  [350 mL] 350 mL PEEP:  [8 cmH20] 8 cmH20 Plateau Pressure:  [20 cmH20-23 cmH20] 20 cmH20   Intake/Output Summary (Last 24 hours) at 03/17/2022 0858 Last data filed at 03/17/2022 0800 Gross per 24 hour  Intake 3307.22 ml  Output 3218.4 ml  Net 88.82 ml   Filed Weights   03/14/22 0500 03/15/22 0500 03/16/22 0500  Weight: 83.4 kg 82.5 kg 79.9 kg    Examination: HEENT:  EOMI, sclera anicteric,  Chronically ill-appearing Neck:     No masses; no thyromegaly Lungs:  Clear to auscultation bilaterally; normal respiratory effort CV:         Regular rate and rhythm; no murmurs Abd:      + bowel sounds; soft, non-tender; no palpable masses, no distension Ext:    No edema; adequate peripheral perfusion Skin:      Warm and dry; no rash Neuro:   Sedated, unresponsive  Labs/imaging reviewed Significant for sodium 134, creatinine 1.46 WBC count 30, platelets 123, hemoglobin 8.1  Access: L IJ TLC c/d/I, R Fem HD with thrombi pad over site / dried blood   BC 1/3  NGTD >  Tracheal  aspirate 1/2 > negative   Resolved Hospital Problem list   Hypotension - sepsis + sedation  Transaminitis Shock Liver  Assessment & Plan:   Sepsis in setting of Streptococcal Bacteremia  Leukocytosis  Acute Diarrhea  -Antibiotics changed to Zosyn 1/6 as WBC count is higher -follow up blood cultures, NGTD >  -ID on board -No endocarditis on TEE  Acute Hypoxemic Respiratory Failure  Multifocal pneumonia Continue PRVC Continue Brovana, Pulmicort Intermittent chest x-ray  Rash, Hives Completed pepcid, benadryl, steroids.  -supportive care, appears to be resolving   Acute Metabolic / Toxic Encephalopathy  -PAD protocol as above  -PT efforts / Passive ROM Q4  Oliguric Renal Failure, likely ATN  Anion Gap Metabolic Acidosis with Lactic Acidosis  Elevated BUN  ? Possible interstitial process with strep -appreciate Nephrology  -CRRT.  Keel even -Trend BMP / urinary output -Replace electrolytes as indicated -Avoid nephrotoxic agents, ensure adequate renal perfusion  Thrombocytopenia  4T score 2, suspect in setting of sepsis/bacteremia, CRRT. Less likely HIT.  -follow CBC  -monitor for bleeding   Possible Melena  -follow stool output  -H/H stable   UDS Positive: Amphetamines, Cannabis, TCA's  -cessation counseling when able   Bipolar Disorder Chronic Pain  -continue home vilazodone, seroquel at reduced dose   Hyperglycemia -SSI  At Risk Malnutrition  -TF per Nutrition   Best Practice (right click and "Reselect all SmartList Selections" daily)  Diet/type: tubefeeds DVT prophylaxis: prophylactic heparin  GI prophylaxis: PPI Lines: Central line and Dialysis Catheter Foley:  Yes, and it is still needed Code Status:  full code Last date of multidisciplinary goals of care discussion: 1/7 boyfriend updated at bedside on plan of care.  Told him that we need to have a family meeting soon to discuss goals of care.  Critical care time:   The patient is critically  ill with multiple organ system failure and requires high complexity decision making for assessment and support, frequent evaluation and titration of therapies, advanced monitoring, review of radiographic studies and interpretation of complex data.   Critical Care Time devoted to patient care services, exclusive of separately billable procedures, described in this note is 35 minutes.   Marshell Garfinkel MD [facility] See Amion for pager  If no response to pager , please call [phone] until 7pm After 7:00 pm call Elink  [phone] 03/17/2022, 9:16 AM

## 2022-03-17 NOTE — Progress Notes (Addendum)
Renal Progress Note:   Assessment/Plan: [name] with many medical issues-  now presents with AKI in the setting of a strep bacteremia, volume depletion    1.AKI-   - crt of 0.8 in August of 2023, bland UA in October of 23.  Now with AKI , proteinuria and hematuria but also pyuria.  Seems most likely to be AKI from ATN in the setting of bacteremia and given the proteinuria and hematuria concerning for GN.  CK normal. Started CRRT on 1/2 after nontunneled catheter with critical care - low UOP, worsening azotemia, clinical worsening. ANA and ANCA negative.  C3 slightly low, C4 normal - UF goal 50 - 100 ml/hr as tolerated -> on 1/6 9AM with hypotension requiring 2 pressors but currently off all pressors and able to UF. Filter has not clotted since starting Heparin protocol (500U/hr) on evening of 1/6. - could have a strep related GN - the treatment would be supportive and directed at her infection     2. Hypotension - 2 pressors off on 1/7   3. Hyperkalemia-  due to AKI - improved with CRRT and now on 4K fluids for CRRT   4. Metabolic acidosis-  bicarb drip initially and now on CRRT; improved with CRRT  5. Normocytic Anemia  - defer IV iron in setting of infection.  Aranesp 40 mcg every Thursday  6. Strep bacteremia-  streptococcus infantarius. antibiotics per CCM. ID consulted.  They have recommended an additional imaging.  Avoid IV contrast if able   7. Acute hypoxic respiratory failure  - mechanical ventilation per primary team     Disposition - in ICU on CRRT   Subjective:   She had 3.8 L UF over 1/4 with CRRT.  She was anuric over 1/4.  She had been tolerating net negative 100 ml/ but now UF is now back on. Off pressors today.   Review of systems:   Unable to obtain secondary to intubated and sedated  (being weaned down)  Objective Vital signs in last 24 hours: Vitals:   03/17/22 0802 03/17/22 0815 03/17/22 0830 03/17/22 0845  BP:      Pulse:  91 91 90  Resp:  (!) 0 (!)  0 (!) 0  Temp:  98.6 F (37 C) 98.6 F (37 C) 98.6 F (37 C)  TempSrc:      SpO2: 96% 95% 97% 98%  Weight:      Height:       Weight change:   Intake/Output Summary (Last 24 hours) at 03/17/2022 1037 Last data filed at 03/17/2022 1000 Gross per 24 hour  Intake 3309.57 ml  Output 3409.4 ml  Net -99.83 ml       Labs: Basic Metabolic Panel: Recent Labs  Lab 03/16/22 0507 03/16/22 1855 03/17/22 0512  NA 137 136 134*  136  K 4.2 4.9 4.5  4.5  CL 103 103 101  102  CO2 28 27 27  28   GLUCOSE 171* 171* 198*  202*  BUN 24* 22* 19  18  CREATININE 1.57* 1.63* 1.46*  1.46*  CALCIUM 8.4* 7.8* 8.3*  8.4*  PHOS 3.3 2.9 2.9   Liver Function Tests: Recent Labs  Lab 03/14/22 2100 03/15/22 0444 03/16/22 0507 03/16/22 1855 03/17/22 0512  AST 38  --   --   --   --   ALT 26  --   --   --   --   ALKPHOS 88  --   --   --   --  BILITOT 0.4  --   --   --   --   PROT 5.6*  --   --   --   --   ALBUMIN 2.2*   < > 2.0* 1.8* 1.9*   < > = values in this interval not displayed.   No results for input(s): "LIPASE", "AMYLASE" in the last 168 hours.  No results for input(s): "AMMONIA" in the last 168 hours. CBC: Recent Labs  Lab 03/12/22 0418 03/13/22 0859 03/14/22 0515 03/15/22 0444 03/16/22 0507 03/17/22 0512  WBC 18.0*  --  20.2* 25.1* 32.5* 30.0*  HGB 10.4*  --  9.4* 9.0* 8.6* 8.1*  HCT 29.6*  --  28.2* 28.8* 27.2* 26.3*  MCV 92.5  --  96.9 102.1* 103.4* 104.8*  PLT 117*   < > 97* 100* 101* 123*   < > = values in this interval not displayed.   Cardiac Enzymes: No results for input(s): "CKTOTAL", "CKMB", "CKMBINDEX", "TROPONINI" in the last 168 hours.  CBG: Recent Labs  Lab 03/16/22 1551 03/16/22 1928 03/16/22 2332 03/17/22 0343 03/17/22 0739  GLUCAP 183* 156* 165* 184* 213*    Iron Studies: No results for input(s): "IRON", "TIBC", "TRANSFERRIN", "FERRITIN" in the last 72 hours. Studies/Results: DG Chest Port 1 View  Result Date: 03/16/2022 CLINICAL  DATA:  Respiratory failure EXAM: PORTABLE CHEST 1 VIEW COMPARISON:  03/15/2022 FINDINGS: Pulmonary vascular congestion. Linear subsegmental atelectasis right base. No pneumothorax or pleural effusion. Endotracheal tube tip now 1.2 cm above carina. Left IJ CVC tip overlies distal SVC. IMPRESSION: Pulmonary vascular congestion. Right base subsegmental atelectasis. Overall improved aeration compared to the prior study. Electronically Signed   By: Sammie Bench M.D.   On: 03/16/2022 10:24   CT CHEST ABDOMEN PELVIS WO CONTRAST  Result Date: 03/15/2022 CLINICAL DATA:  Sepsis, strep bacteremia. EXAM: CT CHEST, ABDOMEN AND PELVIS WITHOUT CONTRAST TECHNIQUE: Multidetector CT imaging of the chest, abdomen and pelvis was performed following the standard protocol without IV contrast. RADIATION DOSE REDUCTION: This exam was performed according to the departmental dose-optimization program which includes automated exposure control, adjustment of the mA and/or kV according to patient size and/or use of iterative reconstruction technique. COMPARISON:  CT chest, abdomen and pelvis 03/09/2022. X-rays 03/15/2022. FINDINGS: CT CHEST FINDINGS Cardiovascular: Left IJ catheter in place. Heart is nonenlarged. No significant pericardial effusion. On this non IV contrast exam, the thoracic aorta has a normal course and caliber. Slight vascular calcification. Mediastinum/Nodes: On this non IV contrast exam there is no specific abnormal lymph node enlargement seen in the axillary region, hilum. There are some small mediastinal nodes identified which are not pathologic by size criteria, unchanged from previous. Normal caliber thoracic esophagus with enteric tube in place. There is contrast in the esophagus. Please correlate for any evidence of reflux. On the prior the esophagus was air-filled and patulous Lungs/Pleura: ET tube in place with tip extending to the right main bronchus. Recommend this be retracted. No pneumothorax or effusion.  Breathing motion is seen. Since the prior there has been development of patchy bilateral ill-defined parenchymal opacities with ground-glass components. These are diffusely distributed. Slightly more left upper and lower lobe than right. There is also an area of more dependent consolidative opacity along the right lower lobe which is new. Atelectasis versus infiltrate. Overall recommend follow-up. Musculoskeletal: Slight curvature of the spine with scattered degenerative changes. Right-sided os acromiale. CT ABDOMEN PELVIS FINDINGS Hepatobiliary: Overall preserved liver on this noncontrast examination. No obvious mass. Previous cholecystectomy. Pancreas: Moderate pancreatic atrophy,  unchanged from prior. Spleen: Spleen is nonenlarged. Adrenals/Urinary Tract: Slight thickening of the left adrenal gland is stable. The right adrenal gland is preserved. Bilateral punctate nonobstructing renal stones. No collecting system dilatation. Foley catheter in the contracted urinary bladder. Stomach/Bowel: Rectal tube in place. Large bowel is nondilated. There are several areas of wall thickening along the colon. There is increasing stranding identified and some trace ascites now seen. Please correlate for colitis. Stomach is distended with oral contrast. Enteric tube in place along the stomach. Small bowel is nondilated. No free intra-air identified at this time. Vascular/Lymphatic: Grossly normal caliber aorta and IVC with mild atherosclerotic calcifications. Right femoral line in place extending to the right common iliac vein. No definite abnormal lymph node enlargement present in the abdomen and pelvis. A few small less than 1 cm in size in short axis nodes are seen, not pathologic by size criteria. Reproductive: Status post hysterectomy. No adnexal masses. Other: Scattered skin thickening along the anterior abdomen and pelvic wall with subcutaneous fat stranding anasarca which is increasing. There is some developing mild  ascites in the pelvis and diffuse mesenteric stranding. Musculoskeletal: Mild curvature and degenerative changes along the spine. IMPRESSION: Numerous tubes and lines as above. In particular the ET tube has a tip extending to the right main bronchus. Recommend this be retracted 3 cm. Persistent areas of wall thickening along the colon diffusely with increasing stranding and trace ascites in the abdomen. Please correlate for colitis. No obstruction or free air. Interval development compared to the prior CT scan of patchy bilateral ill-defined lung opacities diffusely distributed. Slightly more left than right overall. Please correlate for infectious or inflammatory process including an atypical or viral process in the differential. Nonobstructing renal stones. Persistent patulous esophagus with some contrast in the lumen of the esophagus underdistended stomach. Please correlate for any evidence of gastroesophageal reflux. Critical Value/emergent results were called by telephone at the time of interpretation on 03/15/2022 at 4:26 pm to provider Dr. Vaughan Browner, Who verbally acknowledged these results. Electronically Signed   By: Jill Side M.D.   On: 03/15/2022 16:48   DG Abd 1 View  Result Date: 03/15/2022 CLINICAL DATA:  Encounter for OG tube placement EXAM: ABDOMEN - 1 VIEW COMPARISON:  None Available. FINDINGS: The feeding tube terminates in the region of the distal gastric body. An ETT terminates 1.1 cm above the carina. No other acute abnormalities. IMPRESSION: 1. The feeding tube terminates in the region of the distal gastric body. 2. The ETT terminates 1.1 cm above the carina. Recommend withdrawing 1 cm. These results will be called to the ordering clinician or representative by the Radiologist Assistant, and communication documented in the PACS or [employer]. Electronically Signed   By: Dorise Bullion III M.D.   On: 03/15/2022 15:00   ECHO TEE  Result Date: 03/15/2022    TRANSESOPHOGEAL ECHO REPORT    Patient Name:   IRACEMA LANAGAN Date of Exam: 03/15/2022 Medical Rec #:  [id number]        Height:       58.0 in Accession #:    [id number]       Weight:       181.9 lb Date of Birth:  28-Mar-1965       BSA:          1.749 m Patient Age:    45 years         BP:           121/83 mmHg Patient Gender:  F                HR:           116 bpm. Exam Location:  Inpatient Procedure: Transesophageal Echo, Cardiac Doppler and Color Doppler Indications:     Bacteremia  History:         Patient has no prior history of Echocardiogram examinations.                  Risk Factors:Hypertension, Diabetes, Dyslipidemia, Current                  Smoker and Sleep Apnea. Septic shock, AKI.  Sonographer:     Clayton Lefort RDCS (AE) Referring Phys:  [id number] Donita Brooks Diagnosing Phys: Candee Furbish MD  Sonographer Comments: Bedside TEE at [facility]. PROCEDURE: After discussion of the risks and benefits of a TEE, an informed consent was obtained from the patient. The transesophogeal probe was passed without difficulty through the esophogus of the patient. Sedation performed by different physician. Image quality was good. The patient's vital signs; including heart rate, blood pressure, and oxygen saturation; remained stable throughout the procedure. The patient developed no complications during the procedure.  IMPRESSIONS  1. Left ventricular ejection fraction, by estimation, is 60 to 65%. The left ventricle has normal function. The left ventricle has no regional wall motion abnormalities.  2. Right ventricular systolic function is normal. The right ventricular size is normal.  3. No left atrial/left atrial appendage thrombus was detected.  4. The mitral valve is normal in structure. Trivial mitral valve regurgitation. No evidence of mitral stenosis.  5. The aortic valve is tricuspid. Aortic valve regurgitation is not visualized. No aortic stenosis is present.  6. The inferior vena cava is normal in size with greater than 50% respiratory  variability, suggesting right atrial pressure of 3 mmHg. Conclusion(s)/Recommendation(s): Normal biventricular function without evidence of hemodynamically significant valvular heart disease. No evidence of vegetation/infective endocarditis on this transesophageael echocardiogram. FINDINGS  Left Ventricle: Left ventricular ejection fraction, by estimation, is 60 to 65%. The left ventricle has normal function. The left ventricle has no regional wall motion abnormalities. The left ventricular internal cavity size was normal in size. There is  no left ventricular hypertrophy. Right Ventricle: The right ventricular size is normal. No increase in right ventricular wall thickness. Right ventricular systolic function is normal. Left Atrium: Left atrial size was normal in size. No left atrial/left atrial appendage thrombus was detected. Right Atrium: Right atrial size was normal in size. Pericardium: There is no evidence of pericardial effusion. Mitral Valve: The mitral valve is normal in structure. Trivial mitral valve regurgitation. No evidence of mitral valve stenosis. Tricuspid Valve: The tricuspid valve is normal in structure. Tricuspid valve regurgitation is mild . No evidence of tricuspid stenosis. Aortic Valve: The aortic valve is tricuspid. Aortic valve regurgitation is not visualized. No aortic stenosis is present. Pulmonic Valve: The pulmonic valve was normal in structure. Pulmonic valve regurgitation is not visualized. No evidence of pulmonic stenosis. Aorta: The aortic root is normal in size and structure. Venous: The inferior vena cava is normal in size with greater than 50% respiratory variability, suggesting right atrial pressure of 3 mmHg. IAS/Shunts: No atrial level shunt detected by color flow Doppler. Candee Furbish MD Electronically signed by Candee Furbish MD Signature Date/Time: 03/15/2022/1:49:53 PM    Final    Medications: Infusions:   prismasol BGK 4/2.5 400 mL/hr at 03/16/22 2229    prismasol BGK  4/2.5  400 mL/hr at 03/16/22 2227   sodium chloride Stopped (03/17/22 0701)   sodium chloride     dexmedetomidine (PRECEDEX) IV infusion Stopped (03/16/22 1054)   fentaNYL infusion INTRAVENOUS 250 mcg/hr (03/17/22 1000)   heparin 10,000 units/ 20 mL infusion syringe 350 Units/hr (03/16/22 1816)   norepinephrine (LEVOPHED) Adult infusion 3 mcg/min (03/17/22 1000)   piperacillin-tazobactam Stopped (03/17/22 0640)   prismasol BGK 4/2.5 1,500 mL/hr at 03/17/22 0924   propofol (DIPRIVAN) infusion 35 mcg/kg/min (03/17/22 1000)   vasopressin Stopped (03/17/22 0933)    Scheduled Medications:  arformoterol  15 mcg Nebulization BID   budesonide (PULMICORT) nebulizer solution  0.5 mg Nebulization BID   Chlorhexidine Gluconate Cloth  6 each Topical Daily   darbepoetin (ARANESP) injection - DIALYSIS  40 mcg Subcutaneous Q Thu-1800   feeding supplement (PROSource TF20)  60 mL Per Tube Daily   feeding supplement (VITAL 1.5 CAL)  1,000 mL Per Tube Q24H   insulin aspart  0-6 Units Subcutaneous Q4H   mouth rinse  15 mL Mouth Rinse Q2H   pantoprazole (PROTONIX) IV  40 mg Intravenous Daily   QUEtiapine  100 mg Per Tube QHS   revefenacin  175 mcg Nebulization Daily   sodium chloride flush  10-40 mL Intracatheter Q12H   Thrombi-Pad  1 each Topical Once   Vilazodone HCl  40 mg Per Tube Daily    have reviewed scheduled and prn medications.  Physical Exam:    General adult female in bed critically ill  HEENT NCAT Neck supple trachea midline Lungs coarse mechanical breath sounds Heart S1S2 no rub Abdomen soft nontender nondistended Extremities no lower extremity edema and 1-2+ upper extremity edema. Fingers have peeling skin and indentations from her rings which have all been removed  Neuro - sedation currently running  Access right femoral nontunneled catheter in place   Darlene Barber, Darlene Oris, MD 03/17/2022,10:37 AM  LOS: 7 days

## 2022-03-18 ENCOUNTER — Inpatient Hospital Stay (HOSPITAL_COMMUNITY): Payer: Medicaid Other

## 2022-03-18 DIAGNOSIS — A419 Sepsis, unspecified organism: Secondary | ICD-10-CM | POA: Diagnosis not present

## 2022-03-18 DIAGNOSIS — R6521 Severe sepsis with septic shock: Secondary | ICD-10-CM | POA: Diagnosis not present

## 2022-03-18 LAB — RENAL FUNCTION PANEL
Albumin: 1.9 g/dL — ABNORMAL LOW (ref 3.5–5.0)
Anion gap: 8 (ref 5–15)
BUN: 21 mg/dL — ABNORMAL HIGH (ref 6–20)
CO2: 26 mmol/L (ref 22–32)
Calcium: 8.6 mg/dL — ABNORMAL LOW (ref 8.9–10.3)
Chloride: 102 mmol/L (ref 98–111)
Creatinine, Ser: 1.52 mg/dL — ABNORMAL HIGH (ref 0.44–1.00)
GFR, Estimated: 40 mL/min — ABNORMAL LOW (ref >60)
Glucose, Bld: 166 mg/dL — ABNORMAL HIGH (ref 70–99)
Phosphorus: 3.3 mg/dL (ref 2.5–4.6)
Potassium: 4.3 mmol/L (ref 3.5–5.1)
Sodium: 136 mmol/L (ref 135–145)

## 2022-03-18 LAB — CBC
HCT: 26.2 % — ABNORMAL LOW (ref 36.0–46.0)
Hemoglobin: 8.2 g/dL — ABNORMAL LOW (ref 12.0–15.0)
MCH: 32.8 pg (ref 26.0–34.0)
MCHC: 31.3 g/dL (ref 30.0–36.0)
MCV: 104.8 fL — ABNORMAL HIGH (ref 80.0–100.0)
Platelets: 139 10*3/uL — ABNORMAL LOW (ref 150–400)
RBC: 2.5 MIL/uL — ABNORMAL LOW (ref 3.87–5.11)
RDW: 17.6 % — ABNORMAL HIGH (ref 11.5–15.5)
WBC: 22.6 10*3/uL — ABNORMAL HIGH (ref 4.0–10.5)
nRBC: 0.5 % — ABNORMAL HIGH (ref 0.0–0.2)

## 2022-03-18 LAB — CULTURE, BLOOD (ROUTINE X 2)
Culture: NO GROWTH
Culture: NO GROWTH
Special Requests: ADEQUATE
Special Requests: ADEQUATE

## 2022-03-18 LAB — GLUCOSE, CAPILLARY
Glucose-Capillary: 116 mg/dL — ABNORMAL HIGH (ref 70–99)
Glucose-Capillary: 171 mg/dL — ABNORMAL HIGH (ref 70–99)
Glucose-Capillary: 198 mg/dL — ABNORMAL HIGH (ref 70–99)
Glucose-Capillary: 202 mg/dL — ABNORMAL HIGH (ref 70–99)
Glucose-Capillary: 235 mg/dL — ABNORMAL HIGH (ref 70–99)

## 2022-03-18 LAB — MAGNESIUM: Magnesium: 2.4 mg/dL (ref 1.7–2.4)

## 2022-03-18 LAB — APTT: aPTT: 26 seconds (ref 24–36)

## 2022-03-18 MED ORDER — SODIUM CHLORIDE 0.9 % IV SOLN: INTRAVENOUS | Status: DC

## 2022-03-18 MED ORDER — PIPERACILLIN-TAZOBACTAM 3.375 G IVPB
3.3750 g | Freq: Three times a day (TID) | INTRAVENOUS | Status: DC
  Administered 2022-03-18 – 2022-03-19 (×2): 3.375 g via INTRAVENOUS
  Filled 2022-03-18 (×2): qty 50

## 2022-03-18 MED ORDER — SODIUM CHLORIDE 3 % IN NEBU
4.0000 mL | INHALATION_SOLUTION | Freq: Two times a day (BID) | RESPIRATORY_TRACT | Status: AC
  Administered 2022-03-18 – 2022-03-21 (×6): 4 mL via RESPIRATORY_TRACT
  Filled 2022-03-18 (×8): qty 4

## 2022-03-18 MED ORDER — SODIUM CHLORIDE 0.9 % IV BOLUS
500.0000 mL | Freq: Once | INTRAVENOUS | Status: AC
  Administered 2022-03-18: 500 mL via INTRAVENOUS

## 2022-03-18 MED ORDER — HEPARIN SODIUM (PORCINE) 1000 UNIT/ML DIALYSIS
1000.0000 [IU] | Freq: Once | INTRAMUSCULAR | Status: AC
  Administered 2022-03-18: 4000 [IU] via INTRAVENOUS_CENTRAL
  Filled 2022-03-18: qty 6

## 2022-03-18 MED ORDER — GUAIFENESIN 100 MG/5ML PO LIQD
10.0000 mL | Freq: Four times a day (QID) | ORAL | Status: AC
  Administered 2022-03-18 – 2022-03-20 (×8): 10 mL
  Filled 2022-03-18 (×8): qty 10

## 2022-03-18 NOTE — Progress Notes (Signed)
[facility] Infectious Diseases Follow Up Note  Patient Identification: Patient Name: Darlene Barber MRN: [id number] [place] Date: 03/10/2022 12:58 AM Age: 57 y.o.Today's Date: 03/18/2022  Reason for Visit: critically ill, leukocytosis  Principal Problem:   Septic shock ([place]) Active Problems:   AKI (acute kidney injury) ([place])   Bacteremia   Rash   Acute respiratory failure with hypoxia ([facility])   Antibiotics: Vancomycin 12/20 Ceftriaxone 12/30-1/1, Ampicillin 1/3-1/5, zosyn 1/6-c  Cefepime 12/30 Metronidazole 12/30  Lines/Hardwares: Left IJ CVC, right femoral HD catheter none tunneled, left radial arterial line  Interval Events: Remains afebrile, WBC downtrending since switching antibiotics to Zosyn   Assessment 57 year old female with PMH as below who presented to the ED with complaints of abdominal pain and widespread rash in the setting of eating at a [employer].  Hospital course complicated by acute respiratory failure, AKI with AGMA with deranged LFTs.  ID engaged for strep infantarius bacteremia   # Acute Hypoxemic respiratory failure in the setting of sepsis as well as multifocal pneumonia/aspiration pna  12/31 MRSA PCR negative  1/2 tracheal aspirate NG   # Streptococcus infantarius  bacteremia Repeat blood cultures 1/3 no growth in 5 days 1/5 TEE negative for vegetations or endocarditis Agree with colonoscopy eventually to t/o colonic pathology vs malignancy if survives this admission  # Colonic wall thickening with concerns for colitis noted in CT/Acute diarrhea  12/30 GIP and C diff negative   # Oliguric ATN with AGMA, lactic acidosis -concern strep related GN.  Nephrology following, on CRRT  # leukocytosis and thrombocytopenia - in the setting of sepsis  Recommendations Continue Zosyn for now Monitor CBC BMP Follow-up further goals of care discussion  Rest of the management as per the primary  team. Thank you for the consult. Please page with pertinent questions or concerns.  ______________________________________________________________________ Subjective patient seen and examined at the bedside.  Boyfriend at bedside.  Per RN, failed SBT this morning and back to sedation.  Continues to have diarrhea with no new changes, at least 255mls per day   Past Medical History:  Diagnosis Date   Allergy    Anxiety    Bursitis of both hips    Depression    Frequent headaches    Obesity    Sleep apnea    doesn't use CPAP machine broken,    Past Surgical History:  Procedure Laterality Date   CESAREAN SECTION     x2   CHOLECYSTECTOMY     COLONOSCOPY WITH PROPOFOL N/A 11/20/2017   Procedure: COLONOSCOPY WITH PROPOFOL;  Surgeon: Lin Landsman, MD;  Location: [facility] ENDOSCOPY;  Service: Gastroenterology;  Laterality: N/A;   HYSTERECTOMY ABDOMINAL WITH SALPINGECTOMY  1998   HYSTEROSCOPY      Vitals BP (!) 151/73   Pulse (!) 106   Temp 99 F (37.2 C)   Resp (!) 21   Ht 4\' 10"  (1.473 m)   Wt 79.9 kg   SpO2 96%   BMI 36.81 kg/m   Physical Exam Constitutional: Chronically ill-appearing female lying in the bed    Comments: Intubated and sedated  Cardiovascular:     Rate and Rhythm: Normal rate and regular rhythm.     Heart sounds:  Pulmonary:     Effort: Pulmonary effort is normal on vent with FiO2 at 60%    Comments: Coarse breath sounds bilaterally  Abdominal:     Palpations: Abdomen is soft.     Tenderness: Nondistended   Musculoskeletal:  General: No swelling or tenderness peripheral joints  Skin:    Comments: Prior rashes has resolved per RN  Neurological:     General: Pupils bilaterally equal and symmetrical  Pertinent Microbiology Results for orders placed or performed during the hospital encounter of 03/10/22  MRSA Next Gen by PCR, Nasal     Status: None   Collection Time: 03/10/22  1:26 AM   Specimen: Nasal Mucosa; Nasal Swab  Result Value  Ref Range Status   MRSA by PCR Next Gen NOT DETECTED NOT DETECTED Final    Comment: (NOTE) The GeneXpert MRSA Assay (FDA approved for NASAL specimens only), is one component of a comprehensive MRSA colonization surveillance program. It is not intended to diagnose MRSA infection nor to guide or monitor treatment for MRSA infections. Test performance is not FDA approved in patients less than 38 years old. Performed at [facility], [place] [address] [place], [place] [address]   Culture, Respiratory w Gram Stain     Status: None   Collection Time: 03/12/22  9:20 AM   Specimen: Tracheal Aspirate  Result Value Ref Range Status   Specimen Description   Final    TRACHEAL ASPIRATE Performed at [facility] [address] [place], [place] [address]    Special Requests   Final    NONE Performed at [facility], [place] [address], [place], [place] [address]    Gram Stain NO WBC SEEN NO ORGANISMS SEEN   Final   Culture   Final    NO GROWTH 2 DAYS Performed at [facility], [place] [address] [place], [place] [address]    Report Status 03/14/2022 FINAL  Final  Culture, blood (Routine X 2) w Reflex to ID Panel     Status: None   Collection Time: 03/13/22 12:04 PM   Specimen: BLOOD LEFT HAND  Result Value Ref Range Status   Specimen Description   Final    BLOOD LEFT HAND Performed at [facility] [address] [place], [place] [address]    Special Requests   Final    BOTTLES DRAWN AEROBIC ONLY Blood Culture adequate volume Performed at [facility] [address], [place], [place] [address]    Culture   Final    NO GROWTH 5 DAYS Performed at [facility], [place] [address], [place], [place] [address]    Report Status 03/18/2022 FINAL  Final  Culture, blood (Routine X 2) w Reflex to ID Panel     Status: None   Collection Time: 03/13/22 12:04 PM   Specimen: BLOOD LEFT ARM  Result  Value Ref Range Status   Specimen Description   Final    BLOOD LEFT ARM Performed at [facility] [address] [place], [place] [address]    Special Requests   Final    BOTTLES DRAWN AEROBIC ONLY Blood Culture adequate volume Performed at [facility] [address], [place], [place] [address]    Culture   Final    NO GROWTH 5 DAYS Performed at [facility], [place] [address] [place], [place] [address]    Report Status 03/18/2022 FINAL  Final    Pertinent Lab.    Latest Ref Rng & Units 03/18/2022    4:15 AM 03/17/2022    5:12 AM 03/16/2022    5:07 AM  CBC  WBC 4.0 - 10.5 K/uL 22.6  30.0  32.5   Hemoglobin  12.0 - 15.0 g/dL 8.2  8.1  8.6   Hematocrit 36.0 - 46.0 % 26.2  26.3  27.2   Platelets 150 - 400 K/uL 139  123  101       Latest Ref Rng & Units 03/18/2022    4:15 AM 03/17/2022    4:00 PM 03/17/2022    5:12 AM  CMP  Glucose 70 - 99 mg/dL 166  232  202    198   BUN 6 - 20 mg/dL 21  21  18    19    Creatinine 0.44 - 1.00 mg/dL 1.52  1.41  1.46    1.46   Sodium 135 - 145 mmol/L 136  135  136    134   Potassium 3.5 - 5.1 mmol/L 4.3  4.1  4.5    4.5   Chloride 98 - 111 mmol/L 102  102  102    101   CO2 22 - 32 mmol/L 26  27  28    27    Calcium 8.9 - 10.3 mg/dL 8.6  8.2  8.4    8.3      Pertinent Imaging today Plain films and CT images have been personally visualized and interpreted; radiology reports have been reviewed. Decision making incorporated into the Impression / Recommendations.  DG CHEST PORT 1 VIEW  Result Date: 03/18/2022 CLINICAL DATA:  Hypoxia EXAM: PORTABLE CHEST 1 VIEW COMPARISON:  03/16/2022 FINDINGS: Endotracheal tube, enteric tube, and left IJ central venous catheter remain appropriately positioned. Heart size is normal. Slightly low lung volumes with right greater than left bibasilar opacities. Overall, no significant change in the aeration of the lungs compared to the previous study. No pleural effusion or  pneumothorax. IMPRESSION: 1. Stable chest radiograph. Persistent right greater than left bibasilar opacities. 2. Stable support apparatus. Electronically Signed   By: Davina Poke D.O.   On: 03/18/2022 09:14   DG Chest Port 1 View  Result Date: 03/16/2022 CLINICAL DATA:  Respiratory failure EXAM: PORTABLE CHEST 1 VIEW COMPARISON:  03/15/2022 FINDINGS: Pulmonary vascular congestion. Linear subsegmental atelectasis right base. No pneumothorax or pleural effusion. Endotracheal tube tip now 1.2 cm above carina. Left IJ CVC tip overlies distal SVC. IMPRESSION: Pulmonary vascular congestion. Right base subsegmental atelectasis. Overall improved aeration compared to the prior study. Electronically Signed   By: Sammie Bench M.D.   On: 03/16/2022 10:24   CT CHEST ABDOMEN PELVIS WO CONTRAST  Result Date: 03/15/2022 CLINICAL DATA:  Sepsis, strep bacteremia. EXAM: CT CHEST, ABDOMEN AND PELVIS WITHOUT CONTRAST TECHNIQUE: Multidetector CT imaging of the chest, abdomen and pelvis was performed following the standard protocol without IV contrast. RADIATION DOSE REDUCTION: This exam was performed according to the departmental dose-optimization program which includes automated exposure control, adjustment of the mA and/or kV according to patient size and/or use of iterative reconstruction technique. COMPARISON:  CT chest, abdomen and pelvis 03/09/2022. X-rays 03/15/2022. FINDINGS: CT CHEST FINDINGS Cardiovascular: Left IJ catheter in place. Heart is nonenlarged. No significant pericardial effusion. On this non IV contrast exam, the thoracic aorta has a normal course and caliber. Slight vascular calcification. Mediastinum/Nodes: On this non IV contrast exam there is no specific abnormal lymph node enlargement seen in the axillary region, hilum. There are some small mediastinal nodes identified which are not pathologic by size criteria, unchanged from previous. Normal caliber thoracic esophagus with enteric tube in place.  There is contrast in the esophagus. Please correlate for any evidence of reflux. On the prior the esophagus was air-filled  and patulous Lungs/Pleura: ET tube in place with tip extending to the right main bronchus. Recommend this be retracted. No pneumothorax or effusion. Breathing motion is seen. Since the prior there has been development of patchy bilateral ill-defined parenchymal opacities with ground-glass components. These are diffusely distributed. Slightly more left upper and lower lobe than right. There is also an area of more dependent consolidative opacity along the right lower lobe which is new. Atelectasis versus infiltrate. Overall recommend follow-up. Musculoskeletal: Slight curvature of the spine with scattered degenerative changes. Right-sided os acromiale. CT ABDOMEN PELVIS FINDINGS Hepatobiliary: Overall preserved liver on this noncontrast examination. No obvious mass. Previous cholecystectomy. Pancreas: Moderate pancreatic atrophy, unchanged from prior. Spleen: Spleen is nonenlarged. Adrenals/Urinary Tract: Slight thickening of the left adrenal gland is stable. The right adrenal gland is preserved. Bilateral punctate nonobstructing renal stones. No collecting system dilatation. Foley catheter in the contracted urinary bladder. Stomach/Bowel: Rectal tube in place. Large bowel is nondilated. There are several areas of wall thickening along the colon. There is increasing stranding identified and some trace ascites now seen. Please correlate for colitis. Stomach is distended with oral contrast. Enteric tube in place along the stomach. Small bowel is nondilated. No free intra-air identified at this time. Vascular/Lymphatic: Grossly normal caliber aorta and IVC with mild atherosclerotic calcifications. Right femoral line in place extending to the right common iliac vein. No definite abnormal lymph node enlargement present in the abdomen and pelvis. A few small less than 1 cm in size in short axis nodes  are seen, not pathologic by size criteria. Reproductive: Status post hysterectomy. No adnexal masses. Other: Scattered skin thickening along the anterior abdomen and pelvic wall with subcutaneous fat stranding anasarca which is increasing. There is some developing mild ascites in the pelvis and diffuse mesenteric stranding. Musculoskeletal: Mild curvature and degenerative changes along the spine. IMPRESSION: Numerous tubes and lines as above. In particular the ET tube has a tip extending to the right main bronchus. Recommend this be retracted 3 cm. Persistent areas of wall thickening along the colon diffusely with increasing stranding and trace ascites in the abdomen. Please correlate for colitis. No obstruction or free air. Interval development compared to the prior CT scan of patchy bilateral ill-defined lung opacities diffusely distributed. Slightly more left than right overall. Please correlate for infectious or inflammatory process including an atypical or viral process in the differential. Nonobstructing renal stones. Persistent patulous esophagus with some contrast in the lumen of the esophagus underdistended stomach. Please correlate for any evidence of gastroesophageal reflux. Critical Value/emergent results were called by telephone at the time of interpretation on 03/15/2022 at 4:26 pm to provider Dr. Vaughan Browner, Who verbally acknowledged these results. Electronically Signed   By: Jill Side M.D.   On: 03/15/2022 16:48   DG Abd 1 View  Result Date: 03/15/2022 CLINICAL DATA:  Encounter for OG tube placement EXAM: ABDOMEN - 1 VIEW COMPARISON:  None Available. FINDINGS: The feeding tube terminates in the region of the distal gastric body. An ETT terminates 1.1 cm above the carina. No other acute abnormalities. IMPRESSION: 1. The feeding tube terminates in the region of the distal gastric body. 2. The ETT terminates 1.1 cm above the carina. Recommend withdrawing 1 cm. These results will be called to the  ordering clinician or representative by the Radiologist Assistant, and communication documented in the PACS or [employer]. Electronically Signed   By: Dorise Bullion III M.D.   On: 03/15/2022 15:00   ECHO TEE  Result Date: 03/15/2022  TRANSESOPHOGEAL ECHO REPORT   Patient Name:   Darlene Barber Date of Exam: 03/15/2022 Medical Rec #:  [id number]        Height:       58.0 in Accession #:    [id number]       Weight:       181.9 lb Date of Birth:  [birth year]       BSA:          1.749 m Patient Age:    22 years         BP:           121/83 mmHg Patient Gender: F                HR:           116 bpm. Exam Location:  Inpatient Procedure: Transesophageal Echo, Cardiac Doppler and Color Doppler Indications:     Bacteremia  History:         Patient has no prior history of Echocardiogram examinations.                  Risk Factors:Hypertension, Diabetes, Dyslipidemia, Current                  Smoker and Sleep Apnea. Septic shock, AKI.  Sonographer:     Clayton Lefort RDCS (AE) Referring Phys:  [id number] Donita Brooks Diagnosing Phys: Candee Furbish MD  Sonographer Comments: Bedside TEE at [facility]. PROCEDURE: After discussion of the risks and benefits of a TEE, an informed consent was obtained from the patient. The transesophogeal probe was passed without difficulty through the esophogus of the patient. Sedation performed by different physician. Image quality was good. The patient's vital signs; including heart rate, blood pressure, and oxygen saturation; remained stable throughout the procedure. The patient developed no complications during the procedure.  IMPRESSIONS  1. Left ventricular ejection fraction, by estimation, is 60 to 65%. The left ventricle has normal function. The left ventricle has no regional wall motion abnormalities.  2. Right ventricular systolic function is normal. The right ventricular size is normal.  3. No left atrial/left atrial appendage thrombus was detected.  4. The mitral valve is normal in  structure. Trivial mitral valve regurgitation. No evidence of mitral stenosis.  5. The aortic valve is tricuspid. Aortic valve regurgitation is not visualized. No aortic stenosis is present.  6. The inferior vena cava is normal in size with greater than 50% respiratory variability, suggesting right atrial pressure of 3 mmHg. Conclusion(s)/Recommendation(s): Normal biventricular function without evidence of hemodynamically significant valvular heart disease. No evidence of vegetation/infective endocarditis on this transesophageael echocardiogram. FINDINGS  Left Ventricle: Left ventricular ejection fraction, by estimation, is 60 to 65%. The left ventricle has normal function. The left ventricle has no regional wall motion abnormalities. The left ventricular internal cavity size was normal in size. There is  no left ventricular hypertrophy. Right Ventricle: The right ventricular size is normal. No increase in right ventricular wall thickness. Right ventricular systolic function is normal. Left Atrium: Left atrial size was normal in size. No left atrial/left atrial appendage thrombus was detected. Right Atrium: Right atrial size was normal in size. Pericardium: There is no evidence of pericardial effusion. Mitral Valve: The mitral valve is normal in structure. Trivial mitral valve regurgitation. No evidence of mitral valve stenosis. Tricuspid Valve: The tricuspid valve is normal in structure. Tricuspid valve regurgitation is mild . No evidence of tricuspid stenosis. Aortic Valve: The aortic valve is tricuspid. Aortic  valve regurgitation is not visualized. No aortic stenosis is present. Pulmonic Valve: The pulmonic valve was normal in structure. Pulmonic valve regurgitation is not visualized. No evidence of pulmonic stenosis. Aorta: The aortic root is normal in size and structure. Venous: The inferior vena cava is normal in size with greater than 50% respiratory variability, suggesting right atrial pressure of 3 mmHg.  IAS/Shunts: No atrial level shunt detected by color flow Doppler. Candee Furbish MD Electronically signed by Candee Furbish MD Signature Date/Time: 03/15/2022/1:49:53 PM    Final    DG CHEST PORT 1 VIEW  Result Date: 03/15/2022 CLINICAL DATA:  Acute respiratory failure with hypoxia. EXAM: PORTABLE CHEST 1 VIEW COMPARISON:  March 14, 2022. FINDINGS: The heart size and mediastinal contours are within normal limits. Endotracheal and feeding tubes are in grossly good position. Left internal jugular catheter is unchanged. Stable bibasilar atelectasis or infiltrates are noted. The visualized skeletal structures are unremarkable. IMPRESSION: Stable support apparatus.  Stable bibasilar opacities. Electronically Signed   By: Marijo Conception M.D.   On: 03/15/2022 08:08   DG CHEST PORT 1 VIEW  Result Date: 03/14/2022 CLINICAL DATA:  57 year old female with respiratory failure. Sepsis. EXAM: PORTABLE CHEST 1 VIEW COMPARISON:  Portable chest 03/13/2022 and earlier. FINDINGS: Portable AP semi upright view at 0440 hours. The patient remains rotated to the right. Stable endotracheal tube, left IJ central line. Enteric tube loops in the stomach and retained small volume gastric barium is unchanged. Stable lung volumes and mediastinal contours. Increased right greater than left pulmonary reticular interstitial opacity has mildly improved since 03/11/2022. No pneumothorax, pleural effusion or area of worsening ventilation. Paucity of bowel gas in the visible abdomen. IMPRESSION: 1. Stable lines and tubes. 2. Right greater than left pulmonary interstitial opacity has mildly improved since 03/11/2022. No new cardiopulmonary abnormality. Electronically Signed   By: Genevie Ann M.D.   On: 03/14/2022 06:38   DG CHEST PORT 1 VIEW  Result Date: 03/13/2022 CLINICAL DATA:  Acute respiratory failure with hypoxia. EXAM: PORTABLE CHEST 1 VIEW COMPARISON:  March 11, 2022. FINDINGS: Stable cardiomediastinal silhouette. Stable bilateral lung  opacities are noted concerning for multifocal pneumonia. Endotracheal and feeding tubes are unchanged. Left internal jugular catheter is unchanged. Bony thorax is unremarkable. IMPRESSION: Grossly stable support apparatus.  Stable bilateral lung opacities. Electronically Signed   By: Marijo Conception M.D.   On: 03/13/2022 10:19   DG CHEST PORT 1 VIEW  Result Date: 03/11/2022 CLINICAL DATA:  Status post intubation EXAM: PORTABLE CHEST 1 VIEW COMPARISON:  Film from earlier in the same day. FINDINGS: Cardiac shadow is enlarged but stable. Feeding catheter and left jugular central line are again noted and stable. Endotracheal tube is seen 2 cm above the carina. Lungs are well aerated bilaterally. Patchy airspace opacities are seen bilaterally right greater than left. IMPRESSION: Endotracheal tube in satisfactory position. Stable patchy airspace opacities bilaterally. Electronically Signed   By: Inez Catalina M.D.   On: 03/11/2022 23:58   DG Chest Port 1 View  Result Date: 03/11/2022 CLINICAL DATA:  Hypoxia EXAM: PORTABLE CHEST 1 VIEW COMPARISON:  Film from earlier in the same day. FINDINGS: Cardiac shadow is enlarged. Feeding catheter and left jugular central line are again seen and stable. Patchy opacities are noted in the bases bilaterally as well as the right mid lung new from the prior exam. No bony abnormality is noted. IMPRESSION: New bilateral airspace opacities consistent with multifocal pneumonia. Electronically Signed   By: Linus Mako.D.  On: 03/11/2022 21:16   DG CHEST PORT 1 VIEW  Result Date: 03/11/2022 CLINICAL DATA:  Sepsis. EXAM: PORTABLE CHEST 1 VIEW COMPARISON:  03/09/2022 FINDINGS: There is a feeding tube which is looped within the proximal stomach. Left IJ catheter tip is identified at the superior cavoatrial junction. Stable cardiac enlargement. Progressive decreased lung volumes with increased bibasilar atelectasis. Pulmonary vascular congestion. IMPRESSION: 1. Progressive decreased  lung volumes with increased bibasilar atelectasis. 2. Pulmonary vascular congestion. Electronically Signed   By: Kerby Moors M.D.   On: 03/11/2022 11:44   DG Abd 1 View  Result Date: 03/10/2022 CLINICAL DATA:  NG tube placement EXAM: ABDOMEN - 1 VIEW COMPARISON:  03/09/2022 FINDINGS: Limited radiograph of the lower chest and upper abdomen was obtained for the purposes of enteric tube localization. Enteric tube is seen coursing below the diaphragm with distal tip coiled within the gastric fundus. Gaseous distension of the transverse colon is similar to the previous CT. IMPRESSION: Enteric tube coiled within the gastric fundus. Electronically Signed   By: Davina Poke D.O.   On: 03/10/2022 10:11   DG Chest Port 1 View  Result Date: 03/09/2022 CLINICAL DATA:  Placement of central venous catheter EXAM: PORTABLE CHEST 1 VIEW COMPARISON:  Previous studies including the examination done earlier today FINDINGS: Transverse diameter of heart is increased. Central pulmonary vessels are less prominent. There is interval decrease in interstitial markings in parahilar regions and lower lung fields. Small linear densities are seen in both lower lung fields suggesting subsegmental atelectasis. There is blunting of left lateral CP angle. There is no pneumothorax. There is interval placement of central venous catheter through the left IJ with its tip in superior vena cava close to the right atrium. IMPRESSION: Tip of left IJ central venous catheter is seen in superior vena cava close to right atrium. There is no pneumothorax. There is interval decrease in interstitial markings in both lungs suggesting resolving pulmonary edema. Minimal subsegmental atelectasis is seen in both lower lung fields. Electronically Signed   By: Elmer Picker M.D.   On: 03/09/2022 21:43   CT CHEST ABDOMEN PELVIS WO CONTRAST  Result Date: 03/09/2022 CLINICAL DATA:  57 year old female with possible sepsis. Woke up with hives.  Chest, abdominal and pelvic discomfort. EXAM: CT CHEST, ABDOMEN AND PELVIS WITHOUT CONTRAST TECHNIQUE: Multidetector CT imaging of the chest, abdomen and pelvis was performed following the standard protocol without IV contrast. RADIATION DOSE REDUCTION: This exam was performed according to the departmental dose-optimization program which includes automated exposure control, adjustment of the mA and/or kV according to patient size and/or use of iterative reconstruction technique. COMPARISON:  05/31/2009 abdominal and pelvic CT FINDINGS: Please note that parenchymal and vascular abnormalities may be missed as intravenous contrast was not administered. CT CHEST FINDINGS Cardiovascular: Heart size is within normal limits. Aortic atherosclerotic calcifications are noted. There is no evidence of thoracic aortic aneurysm or pericardial effusion. Mediastinum/Nodes: Fluid within the esophagus is noted and may represent reflux or nonspecific esophageal motility disorder. There is no evidence of mediastinal mass or enlarged lymph nodes. The visualized thyroid and trachea are unremarkable. Lungs/Pleura: There is no evidence of airspace disease, consolidation, mass, suspicious nodule, pleural effusion or pneumothorax. Scattered subsegmental atelectasis/scarring within both lungs noted. Musculoskeletal: No acute or suspicious bony abnormalities are noted. CT ABDOMEN PELVIS FINDINGS Hepatobiliary: The liver is unremarkable. The patient is status post cholecystectomy. There is no evidence of intrahepatic or extrahepatic biliary dilatation. Pancreas: Unremarkable Spleen: Unremarkable Adrenals/Urinary Tract: The kidneys, adrenal glands and bladder  are unremarkable except for punctate nonobstructing bilateral renal calculi. Stomach/Bowel: Fluid throughout the stomach, small bowel and colon noted, nonspecific but may represent a diarrheal state. No definite bowel obstruction, definite bowel wall thickening or pneumoperitoneum. The  appendix is normal. Vascular/Lymphatic: Aortic atherosclerosis. No enlarged abdominal or pelvic lymph nodes. Reproductive: No definite abnormality Other: A trace amount of free pelvic fluid is nonspecific. Musculoskeletal: No acute or suspicious bony abnormalities are noted. IMPRESSION: 1. Fluid throughout the stomach, small bowel and colon, nonspecific but may represent a diarrheal state. No bowel obstruction, definite bowel wall thickening or pneumoperitoneum. 2. Fluid within the esophagus which may represent reflux or nonspecific esophageal motility disorder. 3. Trace amount of free pelvic fluid, nonspecific. 4. Punctate nonobstructing bilateral renal calculi. 5.  Aortic Atherosclerosis (ICD10-I70.0). Electronically Signed   By: Margarette Canada M.D.   On: 03/09/2022 17:14   DG Chest Port 1 View  Result Date: 03/09/2022 CLINICAL DATA:  Hypoxia EXAM: PORTABLE CHEST 1 VIEW COMPARISON:  04/08/2018 FINDINGS: Transverse diameter of heart is increased. There is poor inspiration. Central pulmonary vessels are more prominent. Increased interstitial markings are seen in parahilar regions and lower lung fields. There are small linear densities in left lower lung field. There is possible minimal blunting of lateral CP angles. There is no pneumothorax. Deformity in the lateral end of right clavicle may be residual from previous injury. IMPRESSION: Cardiomegaly. Increased interstitial markings are seen in parahilar regions and lower lung fields suggesting possible mild interstitial edema. There are linear densities in left lower lung fields suggesting subsegmental atelectasis. Possible minimal bilateral pleural effusions. Electronically Signed   By: Elmer Picker M.D.   On: 03/09/2022 14:37    I spent 55  minutes for this patient encounter including review of prior medical records, coordination of care with primary/other specialist with greater than 50% of time being face to face/counseling and discussing  diagnostics/treatment plan with the patient/family.  Electronically signed by:   Rosiland Oz, MD Infectious Disease Physician [facility] Pager: [phone]

## 2022-03-18 NOTE — Progress Notes (Signed)
[facility] Progress Note  Subjective: seen in ICU, remains off pressors.   Vitals:   03/18/22 1145 03/18/22 1150 03/18/22 1200 03/18/22 1544  BP: (!) 103/49  (!) 111/51   Pulse: 90 91 94   Resp: (!) 26 (!) 26 (!) 26   Temp: 99 F (37.2 C)  99 F (37.2 C)   TempSrc:      SpO2: 97% 97% 95% 95%  Weight:      Height:        Exam: General adult female in bed critically ill  HEENT NCAT Neck supple trachea midline Lungs coarse mechanical breath sounds Heart S1S2 no rub Abdomen soft nontender nondistended Extremities no lower extremity edema  Neuro - sedation currently running  Access right femoral nontunneled catheter in place   Assessment/ Plan: AKI - crt of 0.8 in August of 2023, bland UA in October of 23.  Now with AKI , proteinuria and hematuria but also pyuria.  Seems most likely to be AKI from ATN in the setting of bacteremia and given the proteinuria and hematuria concerning for GN.  CK normal. ANA and ANCA negative.  C3 slightly low, C4 normal. Could have a strep related GN - the treatment would be supportive and directed at her infection. We started CRRT on 1/02. Now pt is off pressors. CVP checked today is 9, wts are down and labs controlled w/ CRRT. Will dc CRRT and start IVF's. Keep HD cath in. If doesn't recover can send to [facility] for iHD.  Hypotension - pressors dc'd on 1/07 Hyperkalemia-  resolved Metabolic acidosis - resolved Normocytic Anemia  - defer IV iron in setting of infection.  Aranesp 40 mcg every Thursday  Strep bacteremia-  streptococcus infantarius. antibiotics per CCM. ID consulted.  They have recommended an additional imaging.  Avoid IV contrast if able  Acute hypoxic respiratory failure - mechanical ventilation per primary team   Rob Janos Shampine 03/18/2022, 4:36 PM   Recent Labs  Lab 03/17/22 0512 03/17/22 1600 03/18/22 0415  HGB 8.1*  --  8.2*  ALBUMIN 1.9* 2.1* 1.9*  CALCIUM 8.3*  8.4* 8.2* 8.6*  PHOS 2.9 2.5 3.3  CREATININE  1.46*  1.46* 1.41* 1.52*  K 4.5  4.5 4.1 4.3   No results for input(s): "IRON", "TIBC", "FERRITIN" in the last 168 hours. Inpatient medications:  arformoterol  15 mcg Nebulization BID   budesonide (PULMICORT) nebulizer solution  0.5 mg Nebulization BID   Chlorhexidine Gluconate Cloth  6 each Topical Daily   darbepoetin (ARANESP) injection - DIALYSIS  40 mcg Subcutaneous Q Thu-1800   feeding supplement (PROSource TF20)  60 mL Per Tube Daily   feeding supplement (VITAL 1.5 CAL)  1,000 mL Per Tube Q24H   guaiFENesin  10 mL Per Tube Q6H   heparin injection (subcutaneous)  5,000 Units Subcutaneous Q8H   insulin aspart  0-6 Units Subcutaneous Q4H   mouth rinse  15 mL Mouth Rinse Q2H   pantoprazole (PROTONIX) IV  40 mg Intravenous Daily   QUEtiapine  100 mg Per Tube QHS   revefenacin  175 mcg Nebulization Daily   sodium chloride flush  10-40 mL Intracatheter Q12H   sodium chloride HYPERTONIC  4 mL Nebulization BID   Thrombi-Pad  1 each Topical Once   Vilazodone HCl  40 mg Per Tube Daily    sodium chloride Stopped (03/17/22 0701)   sodium chloride     sodium chloride 75 mL/hr at 03/18/22 1454   dexmedetomidine (PRECEDEX) IV infusion Stopped (03/16/22  1054)   fentaNYL infusion INTRAVENOUS 200 mcg/hr (03/18/22 1447)   norepinephrine (LEVOPHED) Adult infusion Stopped (03/18/22 0054)   piperacillin-tazobactam (ZOSYN)  IV     propofol (DIPRIVAN) infusion 25 mcg/kg/min (03/18/22 1450)   vasopressin Stopped (03/17/22 0933)   Place/Maintain arterial line **AND** sodium chloride, acetaminophen (TYLENOL) oral liquid 160 mg/5 mL, albuterol, docusate, fentaNYL, haloperidol lactate, midazolam, ondansetron (ZOFRAN) IV, mouth rinse, polyethylene glycol, sodium chloride flush

## 2022-03-18 NOTE — Progress Notes (Signed)
Filter pressure had a significant increase that prompted to change filter at 05:23. 127 ml blood was returned. When about to attach the line back to the patient, power outrage occurred 06:20 and needed to change CRRT machine and filter again.

## 2022-03-18 NOTE — Progress Notes (Signed)
[place] Progress Note Patient Name: Darlene Barber DOB: Feb 07, 1966 MRN: [id number]   Date of Service  03/18/2022  HPI/Events of Note  Received request for renewal of restraints Patient seen intubated and a risk for self harm by pulling lines and tubes  eICU Interventions  Bilateral soft wrist restraints renewed Bedside team to assess in am if restraints to be continued     Intervention Category Intermediate Interventions: Other:  Judd Lien 03/18/2022, 11:00 PM

## 2022-03-18 NOTE — Progress Notes (Signed)
PHARMACY NOTE:  ANTIMICROBIAL RENAL DOSAGE ADJUSTMENT  Current antimicrobial regimen includes a mismatch between antimicrobial dosage and estimated renal function.  As per policy approved by the Pharmacy & Therapeutics and Medical Executive Committees, the antimicrobial dosage will be adjusted accordingly.  Current antimicrobial dosage:  zosyn 3.375 gm IV q6h  Indication: Streptococcus infantarius  bacteremia; colitis  Renal Function:  Estimated Creatinine Clearance: 36.9 mL/min (A) (by C-G formula based on SCr of 1.52 mg/dL (H)). []      On intermittent HD, scheduled: []      On CRRT  Per Dr. Jonnie Finner, d/cing CRRT today (03/18/22)    Antimicrobial dosage has been changed to:  3.375gm IV q8h (infuse over 4 hrs)   Thank you for allowing pharmacy to be a part of this patient's care.  Lynelle Doctor, [facility] 03/18/2022 1:48 PM

## 2022-03-18 NOTE — Progress Notes (Signed)
NAME:  Darlene Barber, MRN:  [id number], DOB:  07-18-1965, LOS: 8 ADMISSION DATE:  03/10/2022, CONSULTATION DATE:  12/30  REFERRING MD:  Dr. Cinda Quest, CHIEF COMPLAINT:  Abdominal Pain    History of Present Illness:  [name] who presented to the [facility] ER on 12/31 with reports of abdominal pain and widespread rash.  On admission, the patient significant other reported that the day after eating at a local restaurant in [place], [place] she developed abdominal pain and rash.  She was reportedly up all night with crampy abdominal pain associated with nausea and vomiting.  Around 2 PM on the day of admission she noticed a rash all over her body and had worsening pain prompting him to call EMS.  She was evaluated in the emergency room and was found to have oxygen saturations of 87% on 2 L, afebrile with an elevated white blood cell count of 29.2.  Additional labs notable for acute kidney injury, anion gap metabolic acidosis and LFTs with mixed hepatic and cholestatic picture.  Stool samples were sent with workup of acute diarrhea.  Stool pathogen panel was negative.  C. difficile toxin was negative.  She was treated with 30 mL/kg IV fluid and started on empiric broad-spectrum antibiotics with vancomycin, cefepime and Flagyl.  She remained hypotensive despite IV fluid boluses and was started on vasopressors.  She was transferred to [facility] as there were no inpatient beds at [facility].    PCCM consulted for ICU admission.  Pertinent  Medical History  Chronic Pain Syndrome  Cervical Spondylitis  GERD  Bipolar Disorder  Tobacco Abuse - daily  THC Abuse - daily   Significant Hospital Events: Including procedures, antibiotic start and stop dates in addition to other pertinent events   12/31 admit with abdominal pain, diarrhea.  Left IJ TLC placement.  CT chest abdomen pelvis with fluid throughout the stomach, small bowel and colon, nonspecific but may represent diarrheal state.  No bowel obstruction,  definite bowel wall thickening or pneumoperitoneum.  Fluid within the esophagus may represent reflux or nonspecific esophageal motility disorder.  Trace amount of free pelvic fluid, nonspecific.  UDS positive for tricyclics, THC and amphetamines.  C. difficile negative, GI PCR negative.  COVID, flu, RSV negative.  Antibiotics narrowed to Zosyn. BC positives for strep infantarius.  1/3 CRRT, PEEP 5/60% 1/4 CRRT with negative balance. Precedex added to Fentanyl.  1/5 CT chest abd pelvis, TEE done with no evidence of vegetations  Interim History / Subjective:   Filter changed twice this morning  Still requiring 60% FiO2, not able to SBT   Objective   Blood pressure (!) 151/73, pulse (!) 106, temperature 99 F (37.2 C), resp. rate (!) 21, height 4\' 10"  (1.473 m), weight 79.9 kg, SpO2 96 %.    Vent Mode: PRVC FiO2 (%):  [60 %] 60 % Set Rate:  [26 bmp] 26 bmp Vt Set:  [350 mL] 350 mL PEEP:  [8 cmH20] 8 cmH20 Plateau Pressure:  [16 cmH20-20 cmH20] 19 cmH20   Intake/Output Summary (Last 24 hours) at 03/18/2022 0825 Last data filed at 03/18/2022 0800 Gross per 24 hour  Intake 2700.46 ml  Output 4636 ml  Net -1935.54 ml   Filed Weights   03/15/22 0500 03/16/22 0500 03/18/22 0500  Weight: 82.5 kg 79.9 kg 79.9 kg    Examination: General appearance: 57 y.o., female, chronically ill appearing Eyes: tracking appropriately HENT: NCAT; MMM Neck: Trachea midline; no lymphadenopathy, no JVD Lungs: mech breath sounds  bl, equal chest rise CV: RRR, no murmur  Abdomen: Soft, non-tender; non-distended, BS present  Extremities: No peripheral edema, warm Neuro: does not follow commands   Labs/imaging reviewed Lytes ok, bicarb 26 Albumin 1.9 WBC 22.6  Access: L IJ TLC c/d/I, R Fem HD with thrombi pad over site / dried blood   BC 1/3  NGTD >  Tracheal aspirate 1/2 > negative   Resolved Hospital Problem list   Hypotension - sepsis + sedation  Transaminitis Shock Liver  Assessment & Plan:    Sepsis in setting of Streptococcal Bacteremia  Leukocytosis  Acute Diarrhea  -follow up blood cultures,1/3 BCx NGTD -ID on board, ABX per ID -No endocarditis on TEE  Acute Hypoxemic Respiratory Failure  Multifocal pneumonia Full vent support, lung protective ventilation Continue Brovana, Pulmicort Pulmonary hygiene  Rash, Hives Completed pepcid, benadryl, steroids.  -supportive care, appears to have resolved  Acute Metabolic / Toxic Encephalopathy  -PAD protocol -PT / Passive ROM Q4  Oliguric Renal Failure, likely ATN  Anion Gap Metabolic Acidosis with Lactic Acidosis  Elevated BUN  ? Possible interstitial process with strep -Nephro following, on RRT -Trend BMP / urinary output -Replace electrolytes as indicated -Avoid nephrotoxic agents, ensure adequate renal perfusion  Thrombocytopenia  4T score 2, suspect in setting of sepsis/bacteremia, CRRT. Less likely HIT.  -trend CBC -monitor for bleeding   Possible Melena  -CTM -trend CBC  UDS Positive: Amphetamines, Cannabis, TCA's  -cessation counseling when able   Bipolar Disorder Chronic Pain  -continue home vilazodone, seroquel at reduced dose   Hyperglycemia -SSI  At Risk Malnutrition  -TF per Nutrition   Best Practice (right click and "Reselect all SmartList Selections" daily)  Diet/type: tubefeeds DVT prophylaxis: prophylactic heparin  GI prophylaxis: PPI Lines: Central line and Dialysis Catheter, discontinue arterial line Foley:  Yes, and it is still needed Code Status:  full code Last date of multidisciplinary goals of care discussion: 1/7 boyfriend updated at bedside on plan of care.  Told him that we need to have a family meeting soon to discuss goals of care. Updated at bedside 1/8.  Critical care time:   The patient is critically ill with multiple organ system failure and requires high complexity decision making for assessment and support, frequent evaluation and titration of therapies,  advanced monitoring, review of radiographic studies and interpretation of complex data.   Critical Care Time devoted to patient care services, exclusive of separately billable procedures, described in this note is 34 minutes.   [place] for pager  If no response to pager , please call [phone] until 7pm After 7:00 pm call Elink  [phone] 03/18/2022, 8:25 AM

## 2022-03-19 DIAGNOSIS — A419 Sepsis, unspecified organism: Secondary | ICD-10-CM | POA: Diagnosis not present

## 2022-03-19 DIAGNOSIS — R6521 Severe sepsis with septic shock: Secondary | ICD-10-CM | POA: Diagnosis not present

## 2022-03-19 LAB — RENAL FUNCTION PANEL
Albumin: 1.9 g/dL — ABNORMAL LOW (ref 3.5–5.0)
Anion gap: 10 (ref 5–15)
BUN: 37 mg/dL — ABNORMAL HIGH (ref 6–20)
CO2: 23 mmol/L (ref 22–32)
Calcium: 8.8 mg/dL — ABNORMAL LOW (ref 8.9–10.3)
Chloride: 103 mmol/L (ref 98–111)
Creatinine, Ser: 2.66 mg/dL — ABNORMAL HIGH (ref 0.44–1.00)
GFR, Estimated: 20 mL/min — ABNORMAL LOW (ref >60)
Glucose, Bld: 174 mg/dL — ABNORMAL HIGH (ref 70–99)
Phosphorus: 5.9 mg/dL — ABNORMAL HIGH (ref 2.5–4.6)
Potassium: 4.8 mmol/L (ref 3.5–5.1)
Sodium: 136 mmol/L (ref 135–145)

## 2022-03-19 LAB — GLUCOSE, CAPILLARY
Glucose-Capillary: 142 mg/dL — ABNORMAL HIGH (ref 70–99)
Glucose-Capillary: 178 mg/dL — ABNORMAL HIGH (ref 70–99)
Glucose-Capillary: 180 mg/dL — ABNORMAL HIGH (ref 70–99)
Glucose-Capillary: 230 mg/dL — ABNORMAL HIGH (ref 70–99)
Glucose-Capillary: 242 mg/dL — ABNORMAL HIGH (ref 70–99)
Glucose-Capillary: 295 mg/dL — ABNORMAL HIGH (ref 70–99)

## 2022-03-19 LAB — CBC
HCT: 25.5 % — ABNORMAL LOW (ref 36.0–46.0)
Hemoglobin: 8 g/dL — ABNORMAL LOW (ref 12.0–15.0)
MCH: 32.9 pg (ref 26.0–34.0)
MCHC: 31.4 g/dL (ref 30.0–36.0)
MCV: 104.9 fL — ABNORMAL HIGH (ref 80.0–100.0)
Platelets: 194 10*3/uL (ref 150–400)
RBC: 2.43 MIL/uL — ABNORMAL LOW (ref 3.87–5.11)
RDW: 17.2 % — ABNORMAL HIGH (ref 11.5–15.5)
WBC: 19.4 10*3/uL — ABNORMAL HIGH (ref 4.0–10.5)
nRBC: 0.5 % — ABNORMAL HIGH (ref 0.0–0.2)

## 2022-03-19 MED ORDER — DEXMEDETOMIDINE HCL IN NACL 200 MCG/50ML IV SOLN: 0.0000 ug/kg/h | INTRAVENOUS | Status: DC

## 2022-03-19 MED ORDER — POLYETHYLENE GLYCOL 3350 17 G PO PACK: 17.0000 g | PACK | Freq: Every day | ORAL | Status: DC

## 2022-03-19 MED ORDER — DOCUSATE SODIUM 50 MG/5ML PO LIQD
100.0000 mg | Freq: Two times a day (BID) | ORAL | Status: DC
  Administered 2022-03-21 – 2022-03-23 (×3): 100 mg
  Filled 2022-03-19 (×4): qty 10

## 2022-03-19 MED ORDER — HYDRALAZINE HCL 20 MG/ML IJ SOLN: 10.0000 mg | INTRAMUSCULAR | Status: DC | PRN

## 2022-03-19 MED ORDER — PIPERACILLIN-TAZOBACTAM IN DEX 2-0.25 GM/50ML IV SOLN
2.2500 g | Freq: Three times a day (TID) | INTRAVENOUS | Status: DC
  Administered 2022-03-19 – 2022-03-20 (×3): 2.25 g via INTRAVENOUS
  Filled 2022-03-19 (×4): qty 50

## 2022-03-19 NOTE — Progress Notes (Signed)
[facility] Progress Note  Subjective: seen in ICU, remains off pressors. BP's high today  Vitals:   03/19/22 0700 03/19/22 0820 03/19/22 0823 03/19/22 0849  BP: 124/66     Pulse: 98     Resp: 15     Temp: 99 F (37.2 C)     TempSrc:      SpO2: 94% 94% 93% 96%  Weight:      Height:        Exam: General adult female in bed critically ill  HEENT NCAT Neck supple trachea midline Lungs coarse mechanical breath sounds Heart S1S2 no rub Abdomen soft nontender nondistended Extremities no lower extremity edema  Neuro - sedation currently running  Access right femoral nontunneled catheter in place  UA 12/30 - prot > 300,. 11-20 rbc, >50 wbc, 0-5 epi    Assessment/ Plan: AKI - crt of 0.8 in August of 2023, bland UA in October of 23.  Now with AKI , proteinuria and hematuria but also pyuria.  Seems most likely to be AKI from ATN in the setting of bacteremia. Given the proteinuria and hematuria concerning for GN serologies sent and ANA and ANCA were negative, C3 slightly low, C4 normal. Could have strep related GN - the treatment would be supportive and directed at her infection. CRRT started 1/02 and dc'd 1/08. Remains off pressors, BP's high how. CVP 9 > 15 today. Will lower IVF's to 45 cc/hr. Watch labs/ UOP, if not improving in the next few days will need to resume CRRT.   HTN - will add prn IV hydralazine Normocytic Anemia  - deferring IV iron in setting of infection.  Aranesp 40 mcg every Thursday  Strep bacteremia-  streptococcus infantarius, on IV ntibiotics per CCM. ID consulting.  Acute hypoxic respiratory failure - remains on vent  Rob Marissa Weaver 03/19/2022, 10:55 AM   Recent Labs  Lab 03/18/22 0415 03/19/22 0433  HGB 8.2* 8.0*  ALBUMIN 1.9* 1.9*  CALCIUM 8.6* 8.8*  PHOS 3.3 5.9*  CREATININE 1.52* 2.66*  K 4.3 4.8    No results for input(s): "IRON", "TIBC", "FERRITIN" in the last 168 hours. Inpatient medications:  arformoterol  15 mcg Nebulization BID    budesonide (PULMICORT) nebulizer solution  0.5 mg Nebulization BID   Chlorhexidine Gluconate Cloth  6 each Topical Daily   darbepoetin (ARANESP) injection - DIALYSIS  40 mcg Subcutaneous Q Thu-1800   docusate  100 mg Per Tube BID   feeding supplement (PROSource TF20)  60 mL Per Tube Daily   feeding supplement (VITAL 1.5 CAL)  1,000 mL Per Tube Q24H   guaiFENesin  10 mL Per Tube Q6H   heparin injection (subcutaneous)  5,000 Units Subcutaneous Q8H   insulin aspart  0-6 Units Subcutaneous Q4H   mouth rinse  15 mL Mouth Rinse Q2H   pantoprazole (PROTONIX) IV  40 mg Intravenous Daily   polyethylene glycol  17 g Per Tube Daily   QUEtiapine  100 mg Per Tube QHS   revefenacin  175 mcg Nebulization Daily   sodium chloride flush  10-40 mL Intracatheter Q12H   sodium chloride HYPERTONIC  4 mL Nebulization BID   Thrombi-Pad  1 each Topical Once   Vilazodone HCl  40 mg Per Tube Daily    sodium chloride Stopped (03/17/22 0701)   sodium chloride     sodium chloride 75 mL/hr at 03/19/22 0800   dexmedetomidine (PRECEDEX) IV infusion 0.4 mcg/kg/hr (03/19/22 0931)   fentaNYL infusion INTRAVENOUS 225 mcg/hr (03/19/22 0800)  norepinephrine (LEVOPHED) Adult infusion Stopped (03/18/22 0054)   piperacillin-tazobactam (ZOSYN)  IV     propofol (DIPRIVAN) infusion 40 mcg/kg/min (03/19/22 0800)   vasopressin Stopped (03/17/22 0933)   Place/Maintain arterial line **AND** sodium chloride, acetaminophen (TYLENOL) oral liquid 160 mg/5 mL, albuterol, docusate, fentaNYL, haloperidol lactate, midazolam, ondansetron (ZOFRAN) IV, mouth rinse, polyethylene glycol, sodium chloride flush

## 2022-03-19 NOTE — Progress Notes (Signed)
NAME:  Darlene Barber, MRN:  [id number], DOB:  Sep 29, 1965, LOS: 9 ADMISSION DATE:  03/10/2022, CONSULTATION DATE:  12/30  REFERRING MD:  Dr. Cinda Barber, CHIEF COMPLAINT:  Abdominal Pain    History of Present Illness:  [name] who presented to the [facility] ER on 12/31 with reports of abdominal pain and widespread rash.  On admission, the patient significant other reported that the day after eating at a local restaurant in [place], [place] she developed abdominal pain and rash.  She was reportedly up all night with crampy abdominal pain associated with nausea and vomiting.  Around 2 PM on the day of admission she noticed a rash all over her body and had worsening pain prompting him to call EMS.  She was evaluated in the emergency room and was found to have oxygen saturations of 87% on 2 L, afebrile with an elevated white blood cell count of 29.2.  Additional labs notable for acute kidney injury, anion gap metabolic acidosis and LFTs with mixed hepatic and cholestatic picture.  Stool samples were sent with workup of acute diarrhea.  Stool pathogen panel was negative.  C. difficile toxin was negative.  She was treated with 30 mL/kg IV fluid and started on empiric broad-spectrum antibiotics with vancomycin, cefepime and Flagyl.  She remained hypotensive despite IV fluid boluses and was started on vasopressors.  She was transferred to [facility] as there were no inpatient beds at [facility].    PCCM consulted for ICU admission.  Pertinent  Medical History  Chronic Pain Syndrome  Cervical Spondylitis  GERD  Bipolar Disorder  Tobacco Abuse - daily  THC Abuse - daily   Significant Hospital Events: Including procedures, antibiotic start and stop dates in addition to other pertinent events   12/31 admit with abdominal pain, diarrhea.  Left IJ TLC placement.  CT chest abdomen pelvis with fluid throughout the stomach, small bowel and colon, nonspecific but may represent diarrheal state.  No bowel obstruction,  definite bowel wall thickening or pneumoperitoneum.  Fluid within the esophagus may represent reflux or nonspecific esophageal motility disorder.  Trace amount of free pelvic fluid, nonspecific.  UDS positive for tricyclics, THC and amphetamines.  C. difficile negative, GI PCR negative.  COVID, flu, RSV negative.  Antibiotics narrowed to Zosyn. BC positives for strep infantarius.  1/3 CRRT, PEEP 5/60% 1/4 CRRT with negative balance. Precedex added to Fentanyl.  1/5 CT chest abd pelvis, TEE done with no evidence of vegetations 1/8 Taken off CRRT, MIVF started  Interim History / Subjective:   Off CRRT, little UOP with MIVF.    Objective   Blood pressure 124/66, pulse 98, temperature 99 F (37.2 C), resp. rate 15, height 4\' 10"  (1.473 m), weight 80 kg, SpO2 93 %. CVP:  [9 mmHg-16 mmHg] 16 mmHg  Vent Mode: PRVC FiO2 (%):  [45 %-50 %] 45 % Set Rate:  [26 bmp] 26 bmp Vt Set:  [350 mL] 350 mL PEEP:  [8 cmH20] 8 cmH20 Plateau Pressure:  [15 cmH20-22 cmH20] 19 cmH20   Intake/Output Summary (Last 24 hours) at 03/19/2022 0832 Last data filed at 03/19/2022 0800 Gross per 24 hour  Intake 3417.7 ml  Output 1340 ml  Net 2077.7 ml   Filed Weights   03/16/22 0500 03/18/22 0500 03/19/22 0400  Weight: 79.9 kg 79.9 kg 80 kg    Examination: General appearance: 57 y.o., female, chronically ill appearing Eyes: not tracking, PERRL HENT: NCAT; MMM Neck: Trachea midline; no lymphadenopathy, no JVD Lungs:  mech breath sounds bl, equal chest rise CV: tachy RR, no murmur  Abdomen: Soft, non-tender; non-distended, BS present  Extremities: No peripheral edema, warm Neuro: does not follow commands, moving all ext nonpurposefully   Labs/imaging reviewed Lytes ok, bicarb 23 S Cr 2.66, BUN 37 WBC 19 PLTs up off CRRT  CXR L>R basilar opacities  Resolved Hospital Problem list   Hypotension - sepsis + sedation  Transaminitis Shock Liver  Assessment & Plan:   Sepsis in setting of Streptococcal  Bacteremia  Leukocytosis  Acute Diarrhea  -follow up blood cultures,1/3 BCx NGTD -ID on board, ABX per ID -No endocarditis on TEE  Acute Hypoxemic Respiratory Failure  Multifocal pneumonia Full vent support, lung protective ventilation, SBT and consider extubation on precedex Continue Brovana, Pulmicort Pulmonary hygiene  Rash, Hives Completed pepcid, benadryl, steroids.  -supportive care, appears to have resolved  Acute Metabolic / Toxic Encephalopathy  -PAD protocol -PT / Passive ROM Q4  Oliguric Renal Failure, likely ATN  Anion Gap Metabolic Acidosis with Lactic Acidosis  Elevated BUN  ? Possible interstitial process with strep -Nephro following, on RRT -Trend BMP / urinary output -Replace electrolytes as indicated -Avoid nephrotoxic agents, ensure adequate renal perfusion  Thrombocytopenia  4T score 2, suspect in setting of sepsis/bacteremia, CRRT. Less likely HIT.  -trend CBC -monitor for bleeding   Possible Melena  -CTM -trend CBC  UDS Positive: Amphetamines, Cannabis, TCA's  -cessation counseling when able   Bipolar Disorder Chronic Pain  -continue home vilazodone, seroquel at reduced dose   Hyperglycemia -SSI  At Risk Malnutrition  -TF per Nutrition, hold for possible extubation  Best Practice (right click and "Reselect all SmartList Selections" daily)  Diet/type: tubefeeds DVT prophylaxis: prophylactic heparin  GI prophylaxis: PPI Lines: Central line and Dialysis Catheter, discontinue arterial line Foley:  Yes, and it is still needed Code Status:  full code Last date of multidisciplinary goals of care discussion: Discussed with son POA Darlene Barber 03/19/22. We would reintubate if fails extubation with plan for tracheostomy.  Critical care time:   The patient is critically ill with multiple organ system failure and requires high complexity decision making for assessment and support, frequent evaluation and titration of therapies, advanced  monitoring, review of radiographic studies and interpretation of complex data.   Critical Care Time devoted to patient care services, exclusive of separately billable procedures, described in this note is 45 minutes.   [place] for pager  If no response to pager , please call [phone] until 7pm After 7:00 pm call Elink  [phone] 03/19/2022, 8:32 AM

## 2022-03-19 NOTE — TOC Progression Note (Signed)
Transition of Care [facility]) - Progression Note    Patient Details  Name: ADDLEY BALLINGER MRN: [id number] Date of Birth: 01/24/1966  Transition of Care [facility]) CM/SW Contact  Leeroy Cha, RN Phone Number: 03/19/2022, 8:01 AM  Clinical Narrative:    Remians on vent and sedated.  Off crrt.  On pressors.  Following for toc needs and outcome.   Expected Discharge Plan: Home/Self Care Barriers to Discharge: Continued Medical Work up  Expected Discharge Plan and Services                                               Social Determinants of Health (SDOH) Interventions SDOH Screenings   Depression (PHQ2-9): High Risk (09/03/2021)  Tobacco Use: High Risk (03/17/2022)    Readmission Risk Interventions   Row Labels 03/11/2022   12:18 PM  Readmission Risk Prevention Plan   Section Header. No data exists in this row.   Transportation Screening   Complete  Medication Review Press photographer)   Complete  PCP or Specialist appointment within 3-5 days of discharge   Complete  HRI or [place]   Complete  SW Recovery Care/Counseling Consult   Complete  [place]   Not Applicable

## 2022-03-19 NOTE — Progress Notes (Signed)
PHARMACY NOTE:  ANTIMICROBIAL RENAL DOSAGE ADJUSTMENT  Current antimicrobial regimen includes a mismatch between antimicrobial dosage and estimated renal function.  As per policy approved by the Pharmacy & Therapeutics and Medical Executive Committees, the antimicrobial dosage will be adjusted accordingly.  Current antimicrobial dosage:  3.375gm IV q8h (infuse over 4 hrs)  Indication: Streptococcus infantarius  bacteremia; colitis  Renal Function:  Estimated Creatinine Clearance: 21.1 mL/min (A) (by C-G formula based on SCr of 2.66 mg/dL (H)). []      On intermittent HD, scheduled: []      On CRRT  - CRRT off on 03/18/22 - 1/9: Pt's RN reported that pt is not making any urine    Antimicrobial dosage has been changed to:  zosyn 2.25 gm q8h over 30 min  Thank you for allowing pharmacy to be a part of this patient's care.  Lynelle Doctor, [facility] 03/19/2022 9:23 AM

## 2022-03-20 ENCOUNTER — Inpatient Hospital Stay (HOSPITAL_COMMUNITY): Payer: Medicaid Other

## 2022-03-20 ENCOUNTER — Inpatient Hospital Stay: Payer: Self-pay

## 2022-03-20 DIAGNOSIS — R6521 Severe sepsis with septic shock: Secondary | ICD-10-CM | POA: Diagnosis not present

## 2022-03-20 DIAGNOSIS — A419 Sepsis, unspecified organism: Secondary | ICD-10-CM | POA: Diagnosis not present

## 2022-03-20 LAB — RENAL FUNCTION PANEL
Albumin: 1.7 g/dL — ABNORMAL LOW (ref 3.5–5.0)
Albumin: 1.9 g/dL — ABNORMAL LOW (ref 3.5–5.0)
Anion gap: 13 (ref 5–15)
Anion gap: 12 (ref 5–15)
BUN: 60 mg/dL — ABNORMAL HIGH (ref 6–20)
BUN: 55 mg/dL — ABNORMAL HIGH (ref 6–20)
CO2: 20 mmol/L — ABNORMAL LOW (ref 22–32)
CO2: 23 mmol/L (ref 22–32)
Calcium: 9.3 mg/dL (ref 8.9–10.3)
Calcium: 8.7 mg/dL — ABNORMAL LOW (ref 8.9–10.3)
Chloride: 104 mmol/L (ref 98–111)
Chloride: 103 mmol/L (ref 98–111)
Creatinine, Ser: 4.39 mg/dL — ABNORMAL HIGH (ref 0.44–1.00)
Creatinine, Ser: 3.24 mg/dL — ABNORMAL HIGH (ref 0.44–1.00)
GFR, Estimated: 11 mL/min — ABNORMAL LOW (ref >60)
GFR, Estimated: 16 mL/min — ABNORMAL LOW (ref >60)
Glucose, Bld: 206 mg/dL — ABNORMAL HIGH (ref 70–99)
Glucose, Bld: 288 mg/dL — ABNORMAL HIGH (ref 70–99)
Phosphorus: 7.6 mg/dL — ABNORMAL HIGH (ref 2.5–4.6)
Phosphorus: 5.3 mg/dL — ABNORMAL HIGH (ref 2.5–4.6)
Potassium: 5.6 mmol/L — ABNORMAL HIGH (ref 3.5–5.1)
Potassium: 5.5 mmol/L — ABNORMAL HIGH (ref 3.5–5.1)
Sodium: 137 mmol/L (ref 135–145)
Sodium: 138 mmol/L (ref 135–145)

## 2022-03-20 LAB — CBC
HCT: 25.1 % — ABNORMAL LOW (ref 36.0–46.0)
Hemoglobin: 8 g/dL — ABNORMAL LOW (ref 12.0–15.0)
MCH: 32.5 pg (ref 26.0–34.0)
MCHC: 31.9 g/dL (ref 30.0–36.0)
MCV: 102 fL — ABNORMAL HIGH (ref 80.0–100.0)
Platelets: 257 10*3/uL (ref 150–400)
RBC: 2.46 MIL/uL — ABNORMAL LOW (ref 3.87–5.11)
RDW: 16.7 % — ABNORMAL HIGH (ref 11.5–15.5)
WBC: 17.6 10*3/uL — ABNORMAL HIGH (ref 4.0–10.5)
nRBC: 0.5 % — ABNORMAL HIGH (ref 0.0–0.2)

## 2022-03-20 LAB — GLUCOSE, CAPILLARY
Glucose-Capillary: 201 mg/dL — ABNORMAL HIGH (ref 70–99)
Glucose-Capillary: 215 mg/dL — ABNORMAL HIGH (ref 70–99)
Glucose-Capillary: 224 mg/dL — ABNORMAL HIGH (ref 70–99)
Glucose-Capillary: 273 mg/dL — ABNORMAL HIGH (ref 70–99)
Glucose-Capillary: 245 mg/dL — ABNORMAL HIGH (ref 70–99)

## 2022-03-20 LAB — TRIGLYCERIDES: Triglycerides: 260 mg/dL — ABNORMAL HIGH (ref <150)

## 2022-03-20 MED ORDER — PIPERACILLIN-TAZOBACTAM 3.375 G IVPB
3.3750 g | Freq: Three times a day (TID) | INTRAVENOUS | Status: DC
  Administered 2022-03-20 – 2022-03-21 (×3): 3.375 g via INTRAVENOUS
  Filled 2022-03-20 (×3): qty 50

## 2022-03-20 MED ORDER — PRISMASOL BGK 4/2.5 32-4-2.5 MEQ/L REPLACEMENT SOLN: Status: DC

## 2022-03-20 MED ORDER — SODIUM CHLORIDE 0.9% FLUSH
10.0000 mL | INTRAVENOUS | Status: DC | PRN
  Administered 2022-04-04 – 2022-04-06 (×2): 10 mL
  Administered 2022-04-12: 30 mL

## 2022-03-20 MED ORDER — SODIUM CHLORIDE 0.9% FLUSH
10.0000 mL | Freq: Two times a day (BID) | INTRAVENOUS | Status: DC
  Administered 2022-03-20 – 2022-03-22 (×4): 10 mL
  Administered 2022-03-22 – 2022-03-23 (×2): 20 mL
  Administered 2022-03-23 – 2022-04-04 (×21): 10 mL
  Administered 2022-04-04: 20 mL
  Administered 2022-04-05: 30 mL
  Administered 2022-04-05: 10 mL

## 2022-03-20 MED ORDER — HEPARIN SODIUM (PORCINE) 1000 UNIT/ML DIALYSIS: 1000.0000 [IU] | INTRAMUSCULAR | Status: DC | PRN

## 2022-03-20 MED ORDER — SODIUM CHLORIDE 0.9 % IV SOLN
INTRAVENOUS | Status: DC | PRN
  Administered 2022-04-01: 10 mL via INTRAVENOUS

## 2022-03-20 MED ORDER — PRISMASOL BGK 4/2.5 32-4-2.5 MEQ/L EC SOLN: Status: DC

## 2022-03-20 NOTE — Progress Notes (Signed)
eLink Physician-Brief Progress Note Patient Name: Darlene Barber DOB: 13-Jun-1965 MRN: [id number]   Date of Service  03/20/2022  HPI/Events of Note  Patient now has PICC line. Nursing request to remove L IJ central venous catheter.   eICU Interventions  Plan: Remove L IJ central venous catheter.      Intervention Category Major Interventions: Other:  Lysle Dingwall 03/20/2022, 11:56 PM

## 2022-03-20 NOTE — Progress Notes (Signed)
Peripherally Inserted Central Catheter Placement  The IV Nurse has discussed with the patient and/or persons authorized to consent for the patient, the purpose of this procedure and the potential benefits and risks involved with this procedure.  The benefits include less needle sticks, lab draws from the catheter, and the patient may be discharged home with the catheter. Risks include, but not limited to, infection, bleeding, blood clot (thrombus formation), and puncture of an artery; nerve damage and irregular heartbeat and possibility to perform a PICC exchange if needed/ordered by physician.  Alternatives to this procedure were also discussed.  Bard Power PICC patient education guide, fact sheet on infection prevention and patient information card has been provided to patient /or left at bedside.  Consent signed at bedside by son due to altered mental status.  PICC inserted by Quin Hoop, RN  PICC Placement Documentation  PICC Triple Lumen 03/20/22 Right Brachial 36 cm 2 cm (Active)  Indication for Insertion or Continuance of Line Prolonged intravenous therapies 03/20/22 1722  Exposed Catheter (cm) 2 cm 03/20/22 1722  Site Assessment Clean, Dry, Intact 03/20/22 1722  Lumen #1 Status Flushed;Saline locked;Blood return noted 03/20/22 1722  Lumen #2 Status Flushed;Saline locked;Blood return noted 03/20/22 1722  Lumen #3 Status Flushed;Saline locked;Blood return noted 03/20/22 1722  Dressing Type Transparent;Securing device 03/20/22 1722  Dressing Status Antimicrobial disc in place;Clean, Dry, Intact 03/20/22 1722  Safety Lock Not Applicable 03/55/97 4163  Line Care Connections checked and tightened 03/20/22 1722  Line Adjustment (NICU/IV Team Only) No 03/20/22 1722  Dressing Intervention New dressing 03/20/22 1722  Dressing Change Due 03/27/22 03/20/22 [place], Nicolette Bang 03/20/2022, 5:23 PM

## 2022-03-20 NOTE — Progress Notes (Signed)
NAME:  Darlene Barber, MRN:  [id number], DOB:  [birth year], LOS: 10 ADMISSION DATE:  03/10/2022, CONSULTATION DATE:  12/30  REFERRING MD:  Dr. Cinda Quest, CHIEF COMPLAINT:  Abdominal Pain    History of Present Illness:  [name] who presented to the [facility] ER on 12/31 with reports of abdominal pain and widespread rash.  On admission, the patient significant other reported that the day after eating at a local restaurant in [place], [place] she developed abdominal pain and rash.  She was reportedly up all night with crampy abdominal pain associated with nausea and vomiting.  Around 2 PM on the day of admission she noticed a rash all over her body and had worsening pain prompting him to call EMS.  She was evaluated in the emergency room and was found to have oxygen saturations of 87% on 2 L, afebrile with an elevated white blood cell count of 29.2.  Additional labs notable for acute kidney injury, anion gap metabolic acidosis and LFTs with mixed hepatic and cholestatic picture.  Stool samples were sent with workup of acute diarrhea.  Stool pathogen panel was negative.  C. difficile toxin was negative.  She was treated with 30 mL/kg IV fluid and started on empiric broad-spectrum antibiotics with vancomycin, cefepime and Flagyl.  She remained hypotensive despite IV fluid boluses and was started on vasopressors.  She was transferred to [facility] as there were no inpatient beds at [facility].    PCCM consulted for ICU admission.  Pertinent  Medical History  Chronic Pain Syndrome  Cervical Spondylitis  GERD  Bipolar Disorder  Tobacco Abuse - daily  THC Abuse - daily   Significant Hospital Events: Including procedures, antibiotic start and stop dates in addition to other pertinent events   12/31 admit with abdominal pain, diarrhea.  Left IJ TLC placement.  CT chest abdomen pelvis with fluid throughout the stomach, small bowel and colon, nonspecific but may represent diarrheal state.  No bowel obstruction,  definite bowel wall thickening or pneumoperitoneum.  Fluid within the esophagus may represent reflux or nonspecific esophageal motility disorder.  Trace amount of free pelvic fluid, nonspecific.  UDS positive for tricyclics, THC and amphetamines.  C. difficile negative, GI PCR negative.  COVID, flu, RSV negative.  Antibiotics narrowed to Zosyn. BC positives for strep infantarius.  1/3 CRRT, PEEP 5/60% 1/4 CRRT with negative balance. Precedex added to Fentanyl.  1/5 CT chest abd pelvis, TEE done with no evidence of vegetations 1/8 Taken off CRRT, MIVF started  Interim History / Subjective:   Her respiratory mechanics were ok yesterday on SBT and attempted to transition to precedex for agitation to facilitate extubation but challenging to achieve adequate sedation and BP 220/120 or so during SAT/SBT. Extubation deferred.   Nephro planning to resume CRRT     Objective   Blood pressure (!) 115/53, pulse 97, temperature 99.7 F (37.6 C), resp. rate 16, height 4\' 10"  (1.473 m), weight 83.3 kg, SpO2 92 %. CVP:  [14 mmHg-16 mmHg] 16 mmHg  Vent Mode: PRVC FiO2 (%):  [40 %-45 %] 40 % Set Rate:  [26 bmp] 26 bmp Vt Set:  [350 mL] 350 mL PEEP:  [8 cmH20] 8 cmH20 Plateau Pressure:  [10 cmH20-18 cmH20] 12 cmH20   Intake/Output Summary (Last 24 hours) at 03/20/2022 0940 Last data filed at 03/20/2022 0700 Gross per 24 hour  Intake 3065.07 ml  Output 1185 ml  Net 1880.07 ml   Filed Weights   03/18/22 0500 03/19/22 0400  03/20/22 0500  Weight: 79.9 kg 80 kg 83.3 kg    Examination: General appearance: 57 y.o., female, chronically ill appearing Eyes: not tracking, PERRL HENT: NCAT; MMM Neck: Trachea midline; no lymphadenopathy, no JVD Lungs: mech breath sounds bl, equal chest rise CV:  tachy RR, no murmur  Abdomen: Soft, non-tender; non-distended, BS present  Extremities: trace peripheral edema, warm Neuro: rass -2   Labs/imaging reviewed K 5.6, bicarb 20 S Cr 4.39, BUN 60 WBC 17.6 PLTs  up off CRRT  No new imaging  Resolved Hospital Problem list   Hypotension - sepsis + sedation  Transaminitis Shock Liver Rash, Hives TCP related to CRRT, sepsis  Assessment & Plan:   Sepsis in setting of Streptococcal Bacteremia  Leukocytosis  Acute Diarrhea  -follow up blood cultures,1/3 BCx neg -ID on board, ABX per ID -No endocarditis on TEE  Acute Hypoxemic Respiratory Failure  Multifocal pneumonia Full vent support, lung protective ventilation Can re-attempt SAT/SBT tomorrow on precedex after resumption of CRRT Continue Brovana, Pulmicort Pulmonary hygiene  Acute Metabolic / Toxic Encephalopathy  -PAD protocol -PT/passive ROM  Oliguric Renal Failure, likely ATN  Anion Gap Metabolic Acidosis with Lactic Acidosis  Elevated BUN  -Nephro following, resuming RRT -Trend BMP / urinary output -Replace electrolytes as indicated -Avoid nephrotoxic agents, ensure adequate renal perfusion  Possible Melena  -CTM -trend CBC  UDS Positive: Amphetamines, Cannabis, TCA's  -cessation counseling when able   Bipolar Disorder Chronic Pain  -continue home vilazodone, seroquel at reduced dose   Hyperglycemia -SSI  At Risk Malnutrition  -TF per Nutrition  Best Practice (right click and "Reselect all SmartList Selections" daily)  Diet/type: tubefeeds DVT prophylaxis: prophylactic heparin  GI prophylaxis: PPI Lines: Central line and Dialysis Catheter - will work on getting peripherals and remove central Foley:  Yes, and it is still needed Code Status:  full code Last date of multidisciplinary goals of care discussion: Discussed with son POA Kathreen Dileo 03/19/22. We would reintubate if fails extubation with plan for tracheostomy. Updated boyfriend at bedside 1/10  Critical care time:   The patient is critically ill with multiple organ system failure and requires high complexity decision making for assessment and support, frequent evaluation and titration of therapies,  advanced monitoring, review of radiographic studies and interpretation of complex data.   Critical Care Time devoted to patient care services, exclusive of separately billable procedures, described in this note is 42 minutes.   [place] for pager  If no response to pager , please call [phone] until 7pm After 7:00 pm call Elink  [phone] 03/20/2022, 9:40 AM

## 2022-03-20 NOTE — Progress Notes (Signed)
[facility] Progress Note  Subjective: 75 cc UOP yesterday. +diarrhea 1.2 L. CVP stable 16 at 3 am. BUN 60, creat 4.38, eGFR 11.  K+ 5.6.   Vitals:   03/20/22 0430 03/20/22 0500 03/20/22 0600 03/20/22 0700  BP: (!) 128/58  (!) 114/57 (!) 115/53  Pulse: 97  99 97  Resp: 15  14 16   Temp: 99.7 F (37.6 C)  99.7 F (37.6 C) 99.7 F (37.6 C)  TempSrc:      SpO2: 93%  93% 92%  Weight:  83.3 kg    Height:        Exam: General adult female in bed critically ill  HEENT NCAT Neck supple trachea midline Lungs coarse mechanical breath sounds Heart S1S2 no rub Abdomen soft nontender nondistended Extremities no lower extremity edema  Neuro - sedation currently running  Access right femoral nontunneled catheter in place  UA 12/30 - prot > 300,. 11-20 rbc, >50 wbc, 0-5 epi    Assessment/ Plan: AKI - crt of 0.8 in August of 2023, bland UA in October of 23.  Now with AKI , proteinuria and hematuria but also pyuria.  Seems most likely to be AKI from ATN in the setting of bacteremia. Given the proteinuria and hematuria concerning for GN serologies sent and ANA and ANCA were negative, C3 slightly low, C4 normal. Could have strep related GN - the treatment would be supportive and directed at her infection. CRRT started 1/02 and dc'd 1/08. Remains off pressors, BP's high. CVP stable ~15. Will dc IVF"s, she is 2 L or more + daily w/ meds and drips. Also will resume CRRT w/ rising B/ Cr and minimal UOP.  HTN - added prn IV hydralazine Normocytic Anemia  - deferring IV iron in setting of infection.  Aranesp 40 mcg every Thursday  Strep bacteremia-  streptococcus infantarius, on IV ntibiotics per CCM. ID consulting.  Acute hypoxic respiratory failure - remains on vent  Rob Maelynn Moroney 03/20/2022, 8:46 AM   Recent Labs  Lab 03/19/22 0433 03/20/22 0350  HGB 8.0* 8.0*  ALBUMIN 1.9* 1.7*  CALCIUM 8.8* 9.3  PHOS 5.9* 7.6*  CREATININE 2.66* 4.39*  K 4.8 5.6*    No results for  input(s): "IRON", "TIBC", "FERRITIN" in the last 168 hours. Inpatient medications:  arformoterol  15 mcg Nebulization BID   budesonide (PULMICORT) nebulizer solution  0.5 mg Nebulization BID   Chlorhexidine Gluconate Cloth  6 each Topical Daily   darbepoetin (ARANESP) injection - DIALYSIS  40 mcg Subcutaneous Q Thu-1800   docusate  100 mg Per Tube BID   feeding supplement (PROSource TF20)  60 mL Per Tube Daily   feeding supplement (VITAL 1.5 CAL)  1,000 mL Per Tube Q24H   heparin injection (subcutaneous)  5,000 Units Subcutaneous Q8H   insulin aspart  0-6 Units Subcutaneous Q4H   mouth rinse  15 mL Mouth Rinse Q2H   pantoprazole (PROTONIX) IV  40 mg Intravenous Daily   polyethylene glycol  17 g Per Tube Daily   QUEtiapine  100 mg Per Tube QHS   revefenacin  175 mcg Nebulization Daily   sodium chloride flush  10-40 mL Intracatheter Q12H   sodium chloride HYPERTONIC  4 mL Nebulization BID   Thrombi-Pad  1 each Topical Once   Vilazodone HCl  40 mg Per Tube Daily    sodium chloride Stopped (03/17/22 0701)   sodium chloride     sodium chloride 45 mL/hr at 03/20/22 0700   dexmedetomidine (PRECEDEX) IV infusion  Stopped (03/19/22 1157)   fentaNYL infusion INTRAVENOUS 225 mcg/hr (03/20/22 0700)   norepinephrine (LEVOPHED) Adult infusion Stopped (03/18/22 0054)   piperacillin-tazobactam (ZOSYN)  IV Stopped (03/20/22 0430)   propofol (DIPRIVAN) infusion 40 mcg/kg/min (03/20/22 0814)   vasopressin Stopped (03/17/22 0933)   Place/Maintain arterial line **AND** sodium chloride, acetaminophen (TYLENOL) oral liquid 160 mg/5 mL, albuterol, docusate, fentaNYL, haloperidol lactate, hydrALAZINE, midazolam, ondansetron (ZOFRAN) IV, mouth rinse, polyethylene glycol, sodium chloride flush

## 2022-03-20 NOTE — Progress Notes (Signed)
PHARMACY NOTE:  ANTIMICROBIAL RENAL DOSAGE ADJUSTMENT  Current antimicrobial regimen includes a mismatch between antimicrobial dosage and estimated renal function.  As per policy approved by the Pharmacy & Therapeutics and Medical Executive Committees, the antimicrobial dosage will be adjusted accordingly.  Current antimicrobial dosage:  zosyn 2.25 gm q8h over 30 min  Indication: Streptococcus infantarius  bacteremia; colitis  Renal Function:  Estimated Creatinine Clearance: 13.1 mL/min (A) (by C-G formula based on SCr of 4.39 mg/dL (H)). []      On intermittent HD, scheduled: []      On CRRT  - CRRT off on 03/18/22 - 1/9: Pt's RN reported that pt is not making any urine. Zosyn dose adjusted to 2.25 gm q8h over 30 min - 1/10: scr up 4.39, limited UOP; resuming CRRT    Antimicrobial dosage has been changed to:  zosyn 3.375 gm q8h over 4 hours  Thank you for allowing pharmacy to be a part of this patient's care.  Lynelle Doctor, [facility] 03/20/2022 9:07 AM

## 2022-03-20 NOTE — Progress Notes (Signed)
Nutrition Follow-up  DOCUMENTATION CODES:   Obesity unspecified  INTERVENTION:  - Continue goal Tube feeding via OG: Vital 1.5 at 40 ml/h (960 ml per day) Prosource TF20 60 ml daily Provides 1520 kcal, 85 gm protein, 733 ml free water daily   - Free water flushes per MD - Monitor weight trends   - Once patient off CRRT and if potassium and phosphorus become elevated, can consider adjusting to renal friendly formula Nepro as below: Nepro at 35 ml/h (840 ml per day) Prosource TF20 60 ml daily Provides 1567 kcal, 88 gm protein, 61 ml free water daily    NUTRITION DIAGNOSIS:   Inadequate oral intake related to inability to eat as evidenced by NPO status (on vent). *ongoing  GOAL:   Patient will meet greater than or equal to 90% of their needs *being met with TF  MONITOR:   Vent status, TF tolerance, Labs, Weight trends  REASON FOR ASSESSMENT:   Consult Enteral/tube feeding initiation and management (trickle TF with recs)  ASSESSMENT:   57 yo female w/ pertinent PMH HTN, T2DM, OSA, urge incontinence, bipolar depression presents to ED on 12/30 w/ rash and sob. Found to have sepsis and acute hypoxemic respiratory failure.  12/30 Admit 1/1: intubated, Vital HP at 35mL/hr started 1/2 changed to Vital 1.5 at 58mL/hr 1/3 CRRT started 1/4 advanced to goal of Vital 1.5 at 74mL/hr 1/8 off CRRT, remains at goal TF 1/10 restarting CRRT   Patient remains intubated. On goal TF of Vital 1.5 at 87mL/hr, tolerating well.    Potassium and phosphorus trending back up yesterday and today. Initially discussed adjusting tube feed formula to Nepro for lower potassium and phosphorus but per CCM MD Nephrology restarting CRRT today.   Will plan to keep patient on Vital 1.5 for now, can consider changing to Nepro once back off CRRT if potassium and phosphorus become elevated. Recommendations for Nepro above if needed.    Medications reviewed and include: Colace, Insulin,  Miralax Fentanyl Propofol @ 19.57mL/hr (providing 506 kcals over 24 hours)   Labs reviewed:  K+ 5.6 Creatinine 4.39 Phosphorus 7.6   Diet Order:   Diet Order             Diet NPO time specified  Diet effective now                   EDUCATION NEEDS:  Not appropriate for education at this time  Skin:  Skin Assessment: Reviewed RN Assessment Skin Integrity Issues:: Other (Comment) Other: skin tears L knee  Last BM:  1/9  Height:  Ht Readings from Last 1 Encounters:  03/12/22 4\' 10"  (1.473 m)   Weight:  Wt Readings from Last 1 Encounters:  03/20/22 83.3 kg   Ideal Body Weight:  43.9 kg  BMI:  Body mass index is 38.38 kg/m.  Estimated Nutritional Needs:  Kcal:  1500-1700 kcals Protein:  80-90 grams Fluid:  >/= 1.5L    Samson Frederic RD, LDN For contact information, refer to [facility].

## 2022-03-20 NOTE — Progress Notes (Signed)
Okay to place PICC per Dr. Jonnie Finner with nephrology.

## 2022-03-20 NOTE — Progress Notes (Signed)
[facility] Progress Note Patient Name: Darlene Barber DOB: Jul 16, 1965 MRN: [id number]   Date of Service  03/20/2022  HPI/Events of Note  Agitation - Nursing request to renew restraint orders.   eICU Interventions  Will renew restraint orders X 9 hours.      Intervention Category Major Interventions: Delirium, psychosis, severe agitation - evaluation and management  Tauna Macfarlane Eugene 03/20/2022, 12:06 AM

## 2022-03-20 NOTE — Progress Notes (Signed)
Chest PT on hold due to pt on CRT.

## 2022-03-20 NOTE — Progress Notes (Signed)
Chest PT on hold due to pt getting CRT.

## 2022-03-20 NOTE — Progress Notes (Addendum)
At 21:47, filter pressure was 520 and prompted to change filter. Blood was returned. New filter installed and restarted treatment at 22:20 with filter pressure at 250. Informed Nephro oncallRoyce Macadamia MD, she said to keep current settings and let AM team re-evaluate the patient and treatment.

## 2022-03-20 NOTE — Progress Notes (Addendum)
[facility] Infectious Diseases Follow Up Note  Patient Identification: Patient Name: Darlene Barber MRN: [id number] [place] Date: 03/10/2022 12:58 AM Age: 57 y.o.Today's Date: 03/20/2022  Reason for Visit: critically ill, leukocytosis  Principal Problem:   Septic shock ([facility]) Active Problems:   AKI (acute kidney injury) ([facility])   Bacteremia   Rash   Acute respiratory failure with hypoxia (H[facility])   Antibiotics: Vancomycin 12/20 Ceftriaxone 12/30-1/1, Ampicillin 1/3-1/5, zosyn 1/6-c  Cefepime 12/30 Metronidazole 12/30  Lines/Hardwares: Left IJ CVC, right femoral HD catheter none tunneled, l  Interval Events: Remains afebrile, WBC downtrending since switching antibiotics to Zosyn. On precedex for agitation to facilitate extubation but had elevated BP. CRRT resumed today    Assessment 57 year old female with PMH as below who presented to the ED with complaints of abdominal pain and widespread rash in the setting of eating at a local restaurant.  Hospital course complicated by acute respiratory failure, AKI with AGMA with deranged LFTs.  ID engaged for strep infantarius bacteremia   # Acute Hypoxemic respiratory failure in the setting of sepsis as well as multifocal pneumonia/aspiration pna  12/31 MRSA PCR negative  1/2 tracheal aspirate NG   # Streptococcus infantarius  bacteremia Repeat blood cultures 1/3 no growth in 5 days 1/5 TEE negative for vegetations or endocarditis Agree with colonoscopy eventually to r/o colonic pathology vs malignancy if survives this admission CT abd/pelvis 1/5 negative for hepatobiliary pathology  # Colonic wall thickening with concerns for colitis noted in CT/Acute diarrhea  12/30 GIP and C diff negative  Continues to have diarrhea   # Oliguric ATN/AKI-concern strep related GN.  Nephrology following, back on CRRT  # Leukocytosis and thrombocytopenia - in the setting of sepsis,  improving  Recommendations Complete 7 days of IV zosyn to cover for aspiration pna as well as ? Intraabdominal coverage. Then switch to ampicillin to complete 14 days tx. EOT 03/29/21. May switch PO abtx like amoxicillin if continues to do well for later end of tx course Monitor CBC BMP ID available as needed, please recall if needed.   Rest of the management as per the primary team. Thank you for the consult. Please page with pertinent questions or concerns.  ______________________________________________________________________ Subjective patient seen and examined at the bedside.  Boyfriend at bedside.  RN was cleaning the patient on my arrival   Vitals BP (!) 128/58   Pulse 97   Temp 99.7 F (37.6 C)   Resp 15   Ht 4\' 10"  (1.473 m)   Wt 83.3 kg   SpO2 93%   BMI 38.38 kg/m   Physical Exam Constitutional: Chronically ill-appearing female lying in the bed    Comments: Intubated and on precdex   Cardiovascular:     Rate and Rhythm: Normal rate and regular rhythm.     Heart sounds:  Pulmonary:     Effort: Pulmonary effort is normal on vent with FiO2 at 40%    Comments: Coarse breath sounds bilaterally  Abdominal:     Palpations: Abdomen is soft.     Tenderness: Nondistended   Musculoskeletal:        General: No swelling or tenderness peripheral joints  Skin:    Comments: Prior rashes has resolved per RN  Neurological:     General: Pupils bilaterally equal   Pertinent Microbiology Results for orders placed or performed during the hospital encounter of 03/10/22  MRSA Next Gen by PCR, Nasal     Status: None   Collection Time: 03/10/22  1:26 AM  Specimen: Nasal Mucosa; Nasal Swab  Result Value Ref Range Status   MRSA by PCR Next Gen NOT DETECTED NOT DETECTED Final    Comment: (NOTE) The GeneXpert MRSA Assay (FDA approved for NASAL specimens only), is one component of a comprehensive MRSA colonization surveillance program. It is not intended to diagnose MRSA  infection nor to guide or monitor treatment for MRSA infections. Test performance is not FDA approved in patients less than 2 years old. Performed at [facility], [place] [address], [place], [place] [address]   Culture, Respiratory w Gram Stain     Status: None   Collection Time: 03/12/22  9:20 AM   Specimen: Tracheal Aspirate  Result Value Ref Range Status   Specimen Description   Final    TRACHEAL ASPIRATE Performed at [facility] [address], [place], [place] [address]    Special Requests   Final    NONE Performed at [facility], [place] [address], [place], [place] [address]    Gram Stain NO WBC SEEN NO ORGANISMS SEEN   Final   Culture   Final    NO GROWTH 2 DAYS Performed at [facility], [place] [address], [place], [place] [address]    Report Status 03/14/2022 FINAL  Final  Culture, blood (Routine X 2) w Reflex to ID Panel     Status: None   Collection Time: 03/13/22 12:04 PM   Specimen: BLOOD LEFT HAND  Result Value Ref Range Status   Specimen Description   Final    BLOOD LEFT HAND Performed at [facility] [address], [place], [place] [address]    Special Requests   Final    BOTTLES DRAWN AEROBIC ONLY Blood Culture adequate volume Performed at [facility] [address], [place], [place] [address]    Culture   Final    NO GROWTH 5 DAYS Performed at [facility], [place] [address] [place], [place] [address]    Report Status 03/18/2022 FINAL  Final  Culture, blood (Routine X 2) w Reflex to ID Panel     Status: None   Collection Time: 03/13/22 12:04 PM   Specimen: BLOOD LEFT ARM  Result Value Ref Range Status   Specimen Description   Final    BLOOD LEFT ARM Performed at [place] [address] [place], [place] [address]    Special Requests   Final    BOTTLES DRAWN AEROBIC ONLY Blood Culture adequate volume Performed at [facility] [address], [place], [place] [address]    Culture   Final    NO GROWTH 5 DAYS Performed at [facility], [place] [address], [place], [place] [address]    Report Status 03/18/2022 FINAL  Final    Pertinent Lab.    Latest Ref Rng & Units 03/20/2022    3:50 AM 03/19/2022    4:33 AM 03/18/2022    4:15 AM  CBC  WBC 4.0 - 10.5 K/uL 17.6  19.4  22.6   Hemoglobin 12.0 - 15.0 g/dL 8.0  8.0  8.2   Hematocrit 36.0 - 46.0 % 25.1  25.5  26.2   Platelets 150 - 400 K/uL 257  194  139       Latest Ref Rng & Units 03/20/2022    3:50 AM 03/19/2022    4:33 AM 03/18/2022    4:15 AM  CMP  Glucose 70 - 99  mg/dL 206  174  166   BUN 6 - 20 mg/dL 60  37  21   Creatinine 0.44 - 1.00 mg/dL 4.39  2.66  1.52   Sodium 135 - 145 mmol/L 137  136  136   Potassium 3.5 - 5.1 mmol/L 5.6  4.8  4.3   Chloride 98 - 111 mmol/L 104  103  102   CO2 22 - 32 mmol/L 20  23  26    Calcium 8.9 - 10.3 mg/dL 9.3  8.8  8.6      Pertinent Imaging today Plain films and CT images have been personally visualized and interpreted; radiology reports have been reviewed. Decision making incorporated into the Impression / Recommendations.  DG CHEST PORT 1 VIEW  Result Date: 03/18/2022 CLINICAL DATA:  Hypoxia EXAM: PORTABLE CHEST 1 VIEW COMPARISON:  03/16/2022 FINDINGS: Endotracheal tube, enteric tube, and left IJ central venous catheter remain appropriately positioned. Heart size is normal. Slightly low lung volumes with right greater than left bibasilar opacities. Overall, no significant change in the aeration of the lungs compared to the previous study. No pleural effusion or pneumothorax. IMPRESSION: 1. Stable chest radiograph. Persistent right greater than left bibasilar opacities. 2. Stable support apparatus. Electronically Signed   By: Davina Poke D.O.   On: 03/18/2022 09:14   DG Chest Port 1 View  Result Date: 03/16/2022 CLINICAL DATA:  Respiratory failure EXAM: PORTABLE CHEST 1 VIEW COMPARISON:   03/15/2022 FINDINGS: Pulmonary vascular congestion. Linear subsegmental atelectasis right base. No pneumothorax or pleural effusion. Endotracheal tube tip now 1.2 cm above carina. Left IJ CVC tip overlies distal SVC. IMPRESSION: Pulmonary vascular congestion. Right base subsegmental atelectasis. Overall improved aeration compared to the prior study. Electronically Signed   By: Sammie Bench M.D.   On: 03/16/2022 10:24   CT CHEST ABDOMEN PELVIS WO CONTRAST  Result Date: 03/15/2022 CLINICAL DATA:  Sepsis, strep bacteremia. EXAM: CT CHEST, ABDOMEN AND PELVIS WITHOUT CONTRAST TECHNIQUE: Multidetector CT imaging of the chest, abdomen and pelvis was performed following the standard protocol without IV contrast. RADIATION DOSE REDUCTION: This exam was performed according to the departmental dose-optimization program which includes automated exposure control, adjustment of the mA and/or kV according to patient size and/or use of iterative reconstruction technique. COMPARISON:  CT chest, abdomen and pelvis 03/09/2022. X-rays 03/15/2022. FINDINGS: CT CHEST FINDINGS Cardiovascular: Left IJ catheter in place. Heart is nonenlarged. No significant pericardial effusion. On this non IV contrast exam, the thoracic aorta has a normal course and caliber. Slight vascular calcification. Mediastinum/Nodes: On this non IV contrast exam there is no specific abnormal lymph node enlargement seen in the axillary region, hilum. There are some small mediastinal nodes identified which are not pathologic by size criteria, unchanged from previous. Normal caliber thoracic esophagus with enteric tube in place. There is contrast in the esophagus. Please correlate for any evidence of reflux. On the prior the esophagus was air-filled and patulous Lungs/Pleura: ET tube in place with tip extending to the right main bronchus. Recommend this be retracted. No pneumothorax or effusion. Breathing motion is seen. Since the prior there has been  development of patchy bilateral ill-defined parenchymal opacities with ground-glass components. These are diffusely distributed. Slightly more left upper and lower lobe than right. There is also an area of more dependent consolidative opacity along the right lower lobe which is new. Atelectasis versus infiltrate. Overall recommend follow-up. Musculoskeletal: Slight curvature of the spine with scattered degenerative changes. Right-sided os acromiale. CT ABDOMEN PELVIS FINDINGS Hepatobiliary: Overall preserved liver  on this noncontrast examination. No obvious mass. Previous cholecystectomy. Pancreas: Moderate pancreatic atrophy, unchanged from prior. Spleen: Spleen is nonenlarged. Adrenals/Urinary Tract: Slight thickening of the left adrenal gland is stable. The right adrenal gland is preserved. Bilateral punctate nonobstructing renal stones. No collecting system dilatation. Foley catheter in the contracted urinary bladder. Stomach/Bowel: Rectal tube in place. Large bowel is nondilated. There are several areas of wall thickening along the colon. There is increasing stranding identified and some trace ascites now seen. Please correlate for colitis. Stomach is distended with oral contrast. Enteric tube in place along the stomach. Small bowel is nondilated. No free intra-air identified at this time. Vascular/Lymphatic: Grossly normal caliber aorta and IVC with mild atherosclerotic calcifications. Right femoral line in place extending to the right common iliac vein. No definite abnormal lymph node enlargement present in the abdomen and pelvis. A few small less than 1 cm in size in short axis nodes are seen, not pathologic by size criteria. Reproductive: Status post hysterectomy. No adnexal masses. Other: Scattered skin thickening along the anterior abdomen and pelvic wall with subcutaneous fat stranding anasarca which is increasing. There is some developing mild ascites in the pelvis and diffuse mesenteric stranding.  Musculoskeletal: Mild curvature and degenerative changes along the spine. IMPRESSION: Numerous tubes and lines as above. In particular the ET tube has a tip extending to the right main bronchus. Recommend this be retracted 3 cm. Persistent areas of wall thickening along the colon diffusely with increasing stranding and trace ascites in the abdomen. Please correlate for colitis. No obstruction or free air. Interval development compared to the prior CT scan of patchy bilateral ill-defined lung opacities diffusely distributed. Slightly more left than right overall. Please correlate for infectious or inflammatory process including an atypical or viral process in the differential. Nonobstructing renal stones. Persistent patulous esophagus with some contrast in the lumen of the esophagus underdistended stomach. Please correlate for any evidence of gastroesophageal reflux. Critical Value/emergent results were called by telephone at the time of interpretation on 03/15/2022 at 4:26 pm to provider Dr. Vaughan Browner, Who verbally acknowledged these results. Electronically Signed   By: Jill Side M.D.   On: 03/15/2022 16:48   DG Abd 1 View  Result Date: 03/15/2022 CLINICAL DATA:  Encounter for OG tube placement EXAM: ABDOMEN - 1 VIEW COMPARISON:  None Available. FINDINGS: The feeding tube terminates in the region of the distal gastric body. An ETT terminates 1.1 cm above the carina. No other acute abnormalities. IMPRESSION: 1. The feeding tube terminates in the region of the distal gastric body. 2. The ETT terminates 1.1 cm above the carina. Recommend withdrawing 1 cm. These results will be called to the ordering clinician or representative by the Radiologist Assistant, and communication documented in the PACS or [employer]. Electronically Signed   By: Dorise Bullion III M.D.   On: 03/15/2022 15:00   ECHO TEE  Result Date: 03/15/2022    TRANSESOPHOGEAL ECHO REPORT   Patient Name:   DEBBORAH ALONGE Date of Exam: 03/15/2022  Medical Rec #:  [id number]        Height:       58.0 in Accession #:    [id number]       Weight:       181.9 lb Date of Birth:  04-01-1965       BSA:          1.749 m Patient Age:    40 years         BP:  121/83 mmHg Patient Gender: F                HR:           116 bpm. Exam Location:  Inpatient Procedure: Transesophageal Echo, Cardiac Doppler and Color Doppler Indications:     Bacteremia  History:         Patient has no prior history of Echocardiogram examinations.                  Risk Factors:Hypertension, Diabetes, Dyslipidemia, Current                  Smoker and Sleep Apnea. Septic shock, AKI.  Sonographer:     Clayton Lefort RDCS (AE) Referring Phys:  [id number] Donita Brooks Diagnosing Phys: Candee Furbish MD  Sonographer Comments: Bedside TEE at [facility]. PROCEDURE: After discussion of the risks and benefits of a TEE, an informed consent was obtained from the patient. The transesophogeal probe was passed without difficulty through the esophogus of the patient. Sedation performed by different physician. Image quality was good. The patient's vital signs; including heart rate, blood pressure, and oxygen saturation; remained stable throughout the procedure. The patient developed no complications during the procedure.  IMPRESSIONS  1. Left ventricular ejection fraction, by estimation, is 60 to 65%. The left ventricle has normal function. The left ventricle has no regional wall motion abnormalities.  2. Right ventricular systolic function is normal. The right ventricular size is normal.  3. No left atrial/left atrial appendage thrombus was detected.  4. The mitral valve is normal in structure. Trivial mitral valve regurgitation. No evidence of mitral stenosis.  5. The aortic valve is tricuspid. Aortic valve regurgitation is not visualized. No aortic stenosis is present.  6. The inferior vena cava is normal in size with greater than 50% respiratory variability, suggesting right atrial pressure of 3 mmHg.  Conclusion(s)/Recommendation(s): Normal biventricular function without evidence of hemodynamically significant valvular heart disease. No evidence of vegetation/infective endocarditis on this transesophageael echocardiogram. FINDINGS  Left Ventricle: Left ventricular ejection fraction, by estimation, is 60 to 65%. The left ventricle has normal function. The left ventricle has no regional wall motion abnormalities. The left ventricular internal cavity size was normal in size. There is  no left ventricular hypertrophy. Right Ventricle: The right ventricular size is normal. No increase in right ventricular wall thickness. Right ventricular systolic function is normal. Left Atrium: Left atrial size was normal in size. No left atrial/left atrial appendage thrombus was detected. Right Atrium: Right atrial size was normal in size. Pericardium: There is no evidence of pericardial effusion. Mitral Valve: The mitral valve is normal in structure. Trivial mitral valve regurgitation. No evidence of mitral valve stenosis. Tricuspid Valve: The tricuspid valve is normal in structure. Tricuspid valve regurgitation is mild . No evidence of tricuspid stenosis. Aortic Valve: The aortic valve is tricuspid. Aortic valve regurgitation is not visualized. No aortic stenosis is present. Pulmonic Valve: The pulmonic valve was normal in structure. Pulmonic valve regurgitation is not visualized. No evidence of pulmonic stenosis. Aorta: The aortic root is normal in size and structure. Venous: The inferior vena cava is normal in size with greater than 50% respiratory variability, suggesting right atrial pressure of 3 mmHg. IAS/Shunts: No atrial level shunt detected by color flow Doppler. Candee Furbish MD Electronically signed by Candee Furbish MD Signature Date/Time: 03/15/2022/1:49:53 PM    Final    DG CHEST PORT 1 VIEW  Result Date: 03/15/2022 CLINICAL DATA:  Acute respiratory failure  with hypoxia. EXAM: PORTABLE CHEST 1 VIEW COMPARISON:  March 14, 2022. FINDINGS: The heart size and mediastinal contours are within normal limits. Endotracheal and feeding tubes are in grossly good position. Left internal jugular catheter is unchanged. Stable bibasilar atelectasis or infiltrates are noted. The visualized skeletal structures are unremarkable. IMPRESSION: Stable support apparatus.  Stable bibasilar opacities. Electronically Signed   By: Marijo Conception M.D.   On: 03/15/2022 08:08   DG CHEST PORT 1 VIEW  Result Date: 03/14/2022 CLINICAL DATA:  57 year old female with respiratory failure. Sepsis. EXAM: PORTABLE CHEST 1 VIEW COMPARISON:  Portable chest 03/13/2022 and earlier. FINDINGS: Portable AP semi upright view at 0440 hours. The patient remains rotated to the right. Stable endotracheal tube, left IJ central line. Enteric tube loops in the stomach and retained small volume gastric barium is unchanged. Stable lung volumes and mediastinal contours. Increased right greater than left pulmonary reticular interstitial opacity has mildly improved since 03/11/2022. No pneumothorax, pleural effusion or area of worsening ventilation. Paucity of bowel gas in the visible abdomen. IMPRESSION: 1. Stable lines and tubes. 2. Right greater than left pulmonary interstitial opacity has mildly improved since 03/11/2022. No new cardiopulmonary abnormality. Electronically Signed   By: Genevie Ann M.D.   On: 03/14/2022 06:38   DG CHEST PORT 1 VIEW  Result Date: 03/13/2022 CLINICAL DATA:  Acute respiratory failure with hypoxia. EXAM: PORTABLE CHEST 1 VIEW COMPARISON:  March 11, 2022. FINDINGS: Stable cardiomediastinal silhouette. Stable bilateral lung opacities are noted concerning for multifocal pneumonia. Endotracheal and feeding tubes are unchanged. Left internal jugular catheter is unchanged. Bony thorax is unremarkable. IMPRESSION: Grossly stable support apparatus.  Stable bilateral lung opacities. Electronically Signed   By: Marijo Conception M.D.   On: 03/13/2022 10:19   DG  CHEST PORT 1 VIEW  Result Date: 03/11/2022 CLINICAL DATA:  Status post intubation EXAM: PORTABLE CHEST 1 VIEW COMPARISON:  Film from earlier in the same day. FINDINGS: Cardiac shadow is enlarged but stable. Feeding catheter and left jugular central line are again noted and stable. Endotracheal tube is seen 2 cm above the carina. Lungs are well aerated bilaterally. Patchy airspace opacities are seen bilaterally right greater than left. IMPRESSION: Endotracheal tube in satisfactory position. Stable patchy airspace opacities bilaterally. Electronically Signed   By: Inez Catalina M.D.   On: 03/11/2022 23:58   DG Chest Port 1 View  Result Date: 03/11/2022 CLINICAL DATA:  Hypoxia EXAM: PORTABLE CHEST 1 VIEW COMPARISON:  Film from earlier in the same day. FINDINGS: Cardiac shadow is enlarged. Feeding catheter and left jugular central line are again seen and stable. Patchy opacities are noted in the bases bilaterally as well as the right mid lung new from the prior exam. No bony abnormality is noted. IMPRESSION: New bilateral airspace opacities consistent with multifocal pneumonia. Electronically Signed   By: Inez Catalina M.D.   On: 03/11/2022 21:16   DG CHEST PORT 1 VIEW  Result Date: 03/11/2022 CLINICAL DATA:  Sepsis. EXAM: PORTABLE CHEST 1 VIEW COMPARISON:  03/09/2022 FINDINGS: There is a feeding tube which is looped within the proximal stomach. Left IJ catheter tip is identified at the superior cavoatrial junction. Stable cardiac enlargement. Progressive decreased lung volumes with increased bibasilar atelectasis. Pulmonary vascular congestion. IMPRESSION: 1. Progressive decreased lung volumes with increased bibasilar atelectasis. 2. Pulmonary vascular congestion. Electronically Signed   By: Kerby Moors M.D.   On: 03/11/2022 11:44   DG Abd 1 View  Result Date: 03/10/2022 CLINICAL DATA:  NG tube  placement EXAM: ABDOMEN - 1 VIEW COMPARISON:  03/09/2022 FINDINGS: Limited radiograph of the lower chest and  upper abdomen was obtained for the purposes of enteric tube localization. Enteric tube is seen coursing below the diaphragm with distal tip coiled within the gastric fundus. Gaseous distension of the transverse colon is similar to the previous CT. IMPRESSION: Enteric tube coiled within the gastric fundus. Electronically Signed   By: Davina Poke D.O.   On: 03/10/2022 10:11   DG Chest Port 1 View  Result Date: 03/09/2022 CLINICAL DATA:  Placement of central venous catheter EXAM: PORTABLE CHEST 1 VIEW COMPARISON:  Previous studies including the examination done earlier today FINDINGS: Transverse diameter of heart is increased. Central pulmonary vessels are less prominent. There is interval decrease in interstitial markings in parahilar regions and lower lung fields. Small linear densities are seen in both lower lung fields suggesting subsegmental atelectasis. There is blunting of left lateral CP angle. There is no pneumothorax. There is interval placement of central venous catheter through the left IJ with its tip in superior vena cava close to the right atrium. IMPRESSION: Tip of left IJ central venous catheter is seen in superior vena cava close to right atrium. There is no pneumothorax. There is interval decrease in interstitial markings in both lungs suggesting resolving pulmonary edema. Minimal subsegmental atelectasis is seen in both lower lung fields. Electronically Signed   By: Elmer Picker M.D.   On: 03/09/2022 21:43   CT CHEST ABDOMEN PELVIS WO CONTRAST  Result Date: 03/09/2022 CLINICAL DATA:  57 year old female with possible sepsis. Woke up with hives. Chest, abdominal and pelvic discomfort. EXAM: CT CHEST, ABDOMEN AND PELVIS WITHOUT CONTRAST TECHNIQUE: Multidetector CT imaging of the chest, abdomen and pelvis was performed following the standard protocol without IV contrast. RADIATION DOSE REDUCTION: This exam was performed according to the departmental dose-optimization program which  includes automated exposure control, adjustment of the mA and/or kV according to patient size and/or use of iterative reconstruction technique. COMPARISON:  05/31/2009 abdominal and pelvic CT FINDINGS: Please note that parenchymal and vascular abnormalities may be missed as intravenous contrast was not administered. CT CHEST FINDINGS Cardiovascular: Heart size is within normal limits. Aortic atherosclerotic calcifications are noted. There is no evidence of thoracic aortic aneurysm or pericardial effusion. Mediastinum/Nodes: Fluid within the esophagus is noted and may represent reflux or nonspecific esophageal motility disorder. There is no evidence of mediastinal mass or enlarged lymph nodes. The visualized thyroid and trachea are unremarkable. Lungs/Pleura: There is no evidence of airspace disease, consolidation, mass, suspicious nodule, pleural effusion or pneumothorax. Scattered subsegmental atelectasis/scarring within both lungs noted. Musculoskeletal: No acute or suspicious bony abnormalities are noted. CT ABDOMEN PELVIS FINDINGS Hepatobiliary: The liver is unremarkable. The patient is status post cholecystectomy. There is no evidence of intrahepatic or extrahepatic biliary dilatation. Pancreas: Unremarkable Spleen: Unremarkable Adrenals/Urinary Tract: The kidneys, adrenal glands and bladder are unremarkable except for punctate nonobstructing bilateral renal calculi. Stomach/Bowel: Fluid throughout the stomach, small bowel and colon noted, nonspecific but may represent a diarrheal state. No definite bowel obstruction, definite bowel wall thickening or pneumoperitoneum. The appendix is normal. Vascular/Lymphatic: Aortic atherosclerosis. No enlarged abdominal or pelvic lymph nodes. Reproductive: No definite abnormality Other: A trace amount of free pelvic fluid is nonspecific. Musculoskeletal: No acute or suspicious bony abnormalities are noted. IMPRESSION: 1. Fluid throughout the stomach, small bowel and colon,  nonspecific but may represent a diarrheal state. No bowel obstruction, definite bowel wall thickening or pneumoperitoneum. 2. Fluid within the esophagus which may  represent reflux or nonspecific esophageal motility disorder. 3. Trace amount of free pelvic fluid, nonspecific. 4. Punctate nonobstructing bilateral renal calculi. 5.  Aortic Atherosclerosis (ICD10-I70.0). Electronically Signed   By: Margarette Canada M.D.   On: 03/09/2022 17:14   DG Chest Port 1 View  Result Date: 03/09/2022 CLINICAL DATA:  Hypoxia EXAM: PORTABLE CHEST 1 VIEW COMPARISON:  04/08/2018 FINDINGS: Transverse diameter of heart is increased. There is poor inspiration. Central pulmonary vessels are more prominent. Increased interstitial markings are seen in parahilar regions and lower lung fields. There are small linear densities in left lower lung field. There is possible minimal blunting of lateral CP angles. There is no pneumothorax. Deformity in the lateral end of right clavicle may be residual from previous injury. IMPRESSION: Cardiomegaly. Increased interstitial markings are seen in parahilar regions and lower lung fields suggesting possible mild interstitial edema. There are linear densities in left lower lung fields suggesting subsegmental atelectasis. Possible minimal bilateral pleural effusions. Electronically Signed   By: Elmer Picker M.D.   On: 03/09/2022 14:37    I spent 55  minutes for this patient encounter including review of prior medical records, coordination of care with primary/other specialist with greater than 50% of time being face to face/counseling and discussing diagnostics/treatment plan with the patient/family.  Electronically signed by:   Rosiland Oz, MD Infectious Disease Physician [facility] Pager: [phone]

## 2022-03-21 ENCOUNTER — Inpatient Hospital Stay (HOSPITAL_COMMUNITY): Payer: Medicaid Other

## 2022-03-21 DIAGNOSIS — R6521 Severe sepsis with septic shock: Secondary | ICD-10-CM | POA: Diagnosis not present

## 2022-03-21 DIAGNOSIS — A419 Sepsis, unspecified organism: Secondary | ICD-10-CM | POA: Diagnosis not present

## 2022-03-21 LAB — RENAL FUNCTION PANEL
Albumin: 1.8 g/dL — ABNORMAL LOW (ref 3.5–5.0)
Albumin: 1.9 g/dL — ABNORMAL LOW (ref 3.5–5.0)
Anion gap: 9 (ref 5–15)
Anion gap: 9 (ref 5–15)
BUN: 38 mg/dL — ABNORMAL HIGH (ref 6–20)
BUN: 37 mg/dL — ABNORMAL HIGH (ref 6–20)
CO2: 23 mmol/L (ref 22–32)
CO2: 23 mmol/L (ref 22–32)
Calcium: 7.9 mg/dL — ABNORMAL LOW (ref 8.9–10.3)
Calcium: 8 mg/dL — ABNORMAL LOW (ref 8.9–10.3)
Chloride: 102 mmol/L (ref 98–111)
Chloride: 105 mmol/L (ref 98–111)
Creatinine, Ser: 2.23 mg/dL — ABNORMAL HIGH (ref 0.44–1.00)
Creatinine, Ser: 1.79 mg/dL — ABNORMAL HIGH (ref 0.44–1.00)
GFR, Estimated: 25 mL/min — ABNORMAL LOW (ref >60)
GFR, Estimated: 33 mL/min — ABNORMAL LOW (ref >60)
Glucose, Bld: 191 mg/dL — ABNORMAL HIGH (ref 70–99)
Glucose, Bld: 235 mg/dL — ABNORMAL HIGH (ref 70–99)
Phosphorus: 4.5 mg/dL (ref 2.5–4.6)
Phosphorus: 3.2 mg/dL (ref 2.5–4.6)
Potassium: 5 mmol/L (ref 3.5–5.1)
Potassium: 5 mmol/L (ref 3.5–5.1)
Sodium: 134 mmol/L — ABNORMAL LOW (ref 135–145)
Sodium: 137 mmol/L (ref 135–145)

## 2022-03-21 LAB — CBC
HCT: 23.1 % — ABNORMAL LOW (ref 36.0–46.0)
Hemoglobin: 7.4 g/dL — ABNORMAL LOW (ref 12.0–15.0)
MCH: 33.2 pg (ref 26.0–34.0)
MCHC: 32 g/dL (ref 30.0–36.0)
MCV: 103.6 fL — ABNORMAL HIGH (ref 80.0–100.0)
Platelets: 295 10*3/uL (ref 150–400)
RBC: 2.23 MIL/uL — ABNORMAL LOW (ref 3.87–5.11)
RDW: 16.7 % — ABNORMAL HIGH (ref 11.5–15.5)
WBC: 14 10*3/uL — ABNORMAL HIGH (ref 4.0–10.5)
nRBC: 0.5 % — ABNORMAL HIGH (ref 0.0–0.2)

## 2022-03-21 LAB — URINALYSIS, ROUTINE W REFLEX MICROSCOPIC
Bilirubin Urine: NEGATIVE
Glucose, UA: 50 mg/dL — AB
Ketones, ur: NEGATIVE mg/dL
Nitrite: NEGATIVE
Protein, ur: 100 mg/dL — AB
RBC / HPF: >50 RBC/hpf — ABNORMAL HIGH (ref 0–5)
Specific Gravity, Urine: 1.014 (ref 1.005–1.030)
WBC, UA: >50 WBC/hpf — ABNORMAL HIGH (ref 0–5)
pH: 7 (ref 5.0–8.0)

## 2022-03-21 LAB — BLOOD GAS, VENOUS
Acid-base deficit: 5.2 mmol/L — ABNORMAL HIGH (ref 0.0–2.0)
Bicarbonate: 21.6 mmol/L (ref 20.0–28.0)
O2 Saturation: 67.3 %
Patient temperature: 37
pCO2, Ven: 46 mmHg (ref 44–60)
pH, Ven: 7.28 (ref 7.25–7.43)
pO2, Ven: 37 mmHg (ref 32–45)

## 2022-03-21 LAB — SODIUM, URINE, RANDOM: Sodium, Ur: 66 mmol/L

## 2022-03-21 LAB — GLUCOSE, CAPILLARY
Glucose-Capillary: 150 mg/dL — ABNORMAL HIGH (ref 70–99)
Glucose-Capillary: 155 mg/dL — ABNORMAL HIGH (ref 70–99)
Glucose-Capillary: 190 mg/dL — ABNORMAL HIGH (ref 70–99)
Glucose-Capillary: 194 mg/dL — ABNORMAL HIGH (ref 70–99)
Glucose-Capillary: 213 mg/dL — ABNORMAL HIGH (ref 70–99)
Glucose-Capillary: 213 mg/dL — ABNORMAL HIGH (ref 70–99)
Glucose-Capillary: 258 mg/dL — ABNORMAL HIGH (ref 70–99)
Glucose-Capillary: 266 mg/dL — ABNORMAL HIGH (ref 70–99)

## 2022-03-21 LAB — CREATININE, URINE, RANDOM: Creatinine, Urine: 24 mg/dL

## 2022-03-21 LAB — MAGNESIUM: Magnesium: 3.4 mg/dL — ABNORMAL HIGH (ref 1.7–2.4)

## 2022-03-21 MED ORDER — KETAMINE HCL 50 MG/5ML IJ SOSY
PREFILLED_SYRINGE | INTRAMUSCULAR | Status: AC
  Filled 2022-03-21: qty 10

## 2022-03-21 MED ORDER — ORAL CARE MOUTH RINSE: 15.0000 mL | OROMUCOSAL | Status: DC | PRN

## 2022-03-21 MED ORDER — FENTANYL CITRATE PF 50 MCG/ML IJ SOSY
PREFILLED_SYRINGE | INTRAMUSCULAR | Status: AC
  Filled 2022-03-21: qty 2

## 2022-03-21 MED ORDER — MIDAZOLAM HCL 2 MG/2ML IJ SOLN
INTRAMUSCULAR | Status: AC
  Filled 2022-03-21: qty 2

## 2022-03-21 MED ORDER — SODIUM CHLORIDE 0.9 % IV SOLN
2.0000 g | INTRAVENOUS | Status: AC
  Administered 2022-03-21 – 2022-03-27 (×7): 2 g via INTRAVENOUS
  Filled 2022-03-21 (×7): qty 20

## 2022-03-21 MED ORDER — HEPARIN SODIUM (PORCINE) 1000 UNIT/ML IJ SOLN
1000.0000 [IU] | Freq: Once | INTRAMUSCULAR | Status: AC
  Administered 2022-03-21: 4000 [IU]
  Filled 2022-03-21: qty 4

## 2022-03-21 MED ORDER — ROCURONIUM BROMIDE 10 MG/ML (PF) SYRINGE
PREFILLED_SYRINGE | INTRAVENOUS | Status: AC
  Filled 2022-03-21: qty 10

## 2022-03-21 MED ORDER — FENTANYL CITRATE (PF) 100 MCG/2ML IJ SOLN
INTRAMUSCULAR | Status: AC
  Filled 2022-03-21: qty 2

## 2022-03-21 MED ORDER — SUCCINYLCHOLINE CHLORIDE 200 MG/10ML IV SOSY
PREFILLED_SYRINGE | INTRAVENOUS | Status: AC
  Filled 2022-03-21: qty 10

## 2022-03-21 MED ORDER — ETOMIDATE 2 MG/ML IV SOLN
INTRAVENOUS | Status: AC
  Filled 2022-03-21: qty 20

## 2022-03-21 MED ORDER — LABETALOL HCL 5 MG/ML IV SOLN
20.0000 mg | INTRAVENOUS | Status: AC | PRN
  Administered 2022-03-21 – 2022-03-24 (×8): 20 mg via INTRAVENOUS
  Filled 2022-03-21 (×10): qty 4

## 2022-03-21 MED ORDER — ORAL CARE MOUTH RINSE
15.0000 mL | OROMUCOSAL | Status: DC
  Administered 2022-03-21 – 2022-04-23 (×121): 15 mL via OROMUCOSAL

## 2022-03-21 NOTE — Progress Notes (Signed)
Vest therapy on hold due to pt getting CRRT.

## 2022-03-21 NOTE — Progress Notes (Signed)
[place] Progress Note Patient Name: Darlene JEMMOTT DOB: 02-28-1966 MRN: [id number]   Date of Service  03/21/2022  HPI/Events of Note  Review of abdominal film fails to find CorTrak in stomach or distal esophagus.   eICU Interventions  Plan: Remove CorTrak. Place OGT/NGT to LIS. Repeat abdominal film post OGT/NGT placement.      Intervention Category Major Interventions: Other:  Lysle Dingwall 03/21/2022, 9:09 PM

## 2022-03-21 NOTE — Procedures (Signed)
Extubation Procedure Note  Patient Details:   Name: Darlene Barber DOB: 1965-10-04 MRN: [id number]   Airway Documentation:    Vent end date: 03/21/22 Vent end time: 0950   Evaluation  O2 sats: stable throughout Complications: No apparent complications Patient did tolerate procedure well. Bilateral Breath Sounds: Diminished, Clear   No  Johnette Abraham 03/21/2022, 9:50 AM

## 2022-03-21 NOTE — Progress Notes (Signed)
NAME:  Darlene Barber, MRN:  [id number], DOB:  1966/03/10, LOS: 11 ADMISSION DATE:  03/10/2022, CONSULTATION DATE:  12/30  REFERRING MD:  Dr. Cinda Barber, CHIEF COMPLAINT:  Abdominal Pain    History of Present Illness:  [name] who presented to the [facility] ER on 12/31 with reports of abdominal pain and widespread rash.  On admission, the patient significant other reported that the day after eating at a local restaurant in [place], [place] she developed abdominal pain and rash.  She was reportedly up all night with crampy abdominal pain associated with nausea and vomiting.  Around 2 PM on the day of admission she noticed a rash all over her body and had worsening pain prompting him to call EMS.  She was evaluated in the emergency room and was found to have oxygen saturations of 87% on 2 L, afebrile with an elevated white blood cell count of 29.2.  Additional labs notable for acute kidney injury, anion gap metabolic acidosis and LFTs with mixed hepatic and cholestatic picture.  Stool samples were sent with workup of acute diarrhea.  Stool pathogen panel was negative.  C. difficile toxin was negative.  She was treated with 30 mL/kg IV fluid and started on empiric broad-spectrum antibiotics with vancomycin, cefepime and Flagyl.  She remained hypotensive despite IV fluid boluses and was started on vasopressors.  She was transferred to [facility] as there were no inpatient beds at [facility].    PCCM consulted for ICU admission.  Pertinent  Medical History  Chronic Pain Syndrome  Cervical Spondylitis  GERD  Bipolar Disorder  Tobacco Abuse - daily  THC Abuse - daily   Significant Hospital Events: Including procedures, antibiotic start and stop dates in addition to other pertinent events   12/31 admit with abdominal pain, diarrhea.  Left IJ TLC placement.  CT chest abdomen pelvis with fluid throughout the stomach, small bowel and colon, nonspecific but may represent diarrheal state.  No bowel obstruction,  definite bowel wall thickening or pneumoperitoneum.  Fluid within the esophagus may represent reflux or nonspecific esophageal motility disorder.  Trace amount of free pelvic fluid, nonspecific.  UDS positive for tricyclics, THC and amphetamines.  C. difficile negative, GI PCR negative.  COVID, flu, RSV negative.  Antibiotics narrowed to Zosyn. BC positives for strep infantarius.  1/3 CRRT, PEEP 5/60% 1/4 CRRT with negative balance. Precedex added to Fentanyl.  1/5 CT chest abd pelvis, TEE done with no evidence of vegetations 1/8 Taken off CRRT, MIVF started 1/9 too wild on precedex, severe HTN prevented extubation 1/10 restarted on CRRT  Interim History / Subjective:   Restarted on CRRT   No acute issues   Objective   Blood pressure (!) 124/50, pulse 83, temperature 99.1 F (37.3 C), resp. rate 14, height 4\' 10"  (1.473 m), weight 80.4 kg, SpO2 94 %. CVP:  [8 mmHg-17 mmHg] 8 mmHg  Vent Mode: PRVC FiO2 (%):  [40 %] 40 % Set Rate:  [26 bmp] 26 bmp Vt Set:  [350 mL] 350 mL PEEP:  [8 cmH20] 8 cmH20 Plateau Pressure:  [12 cmH20-22 cmH20] 17 cmH20   Intake/Output Summary (Last 24 hours) at 03/21/2022 0724 Last data filed at 03/21/2022 0700 Gross per 24 hour  Intake 2585.17 ml  Output 3063 ml  Net -477.83 ml   Filed Weights   03/19/22 0400 03/20/22 0500 03/21/22 0500  Weight: 80 kg 83.3 kg 80.4 kg    Examination: General appearance: 57 y.o., female,  chronically ill appearing  Eyes: not tracking HENT: MMM Lungs: mech breath sounds bl, equal chest rise CV: RRR, no murmur  Abdomen: Soft, non-tender; non-distended, BS present  Extremities: traceperipheral edema, warm Neuro: rass -2   Labs/imaging reviewed Lytes ok WBC 14  KUB with dobhoff tip in fundus   Resolved Hospital Problem list   Hypotension - sepsis + sedation  Transaminitis Shock Liver Rash, Hives TCP related to CRRT, sepsis Multifocal pneumonia, possible VAP, completed course 1/11 Possible  melena  Assessment & Plan:   Sepsis in setting of Darlene Barber  Leukocytosis  Acute Diarrhea  -follow up blood cultures,1/3 BCx neg -transition to ceftriaxone to complete 2 week course 1/17 -No endocarditis on TEE  Acute Hypoxemic Respiratory Failure  Full vent support, lung protective ventilation Can re-attempt SAT/SBT today on precedex after resumption of CRRT Continue Brovana, Pulmicort Pulmonary hygiene  Acute Metabolic / Toxic Encephalopathy  -PAD protocol -PT/passive ROM  Oliguric Renal Failure, likely ATN  Anion Gap Metabolic Acidosis with Lactic Acidosis  Elevated BUN  -Nephro following, on RRT -Trend BMP / urinary output -Replace electrolytes as indicated -Avoid nephrotoxic agents, ensure adequate renal perfusion  UDS Positive: Amphetamines, Cannabis, TCA's  -cessation counseling when able   Bipolar Disorder Chronic Pain  -continue home vilazodone, seroquel at reduced dose   Hyperglycemia -SSI  At Risk Malnutrition  -TF per Nutrition - hold for SBT  Best Practice (right click and "Reselect all SmartList Selections" daily)  Diet/type: tubefeeds - hold for SBT DVT prophylaxis: prophylactic heparin  GI prophylaxis: PPI Lines: Central line and Dialysis Catheter (PICC 1/10-, R fem HD cath 1/2-)   Foley:  Yes, and it is still needed Code Status:  full code Last date of multidisciplinary goals of care discussion: Discussed with son POA Darlene Barber 03/19/22. We would reintubate if fails extubation with plan for tracheostomy. Updated boyfriend at bedside 1/10  Critical care time:   The patient is critically ill with multiple organ system failure and requires high complexity decision making for assessment and support, frequent evaluation and titration of therapies, advanced monitoring, review of radiographic studies and interpretation of complex data.   Critical Care Time devoted to patient care services, exclusive of separately billable procedures,  described in this note is 40 minutes.   [place] for pager  If no response to pager , please call [phone] until 7pm After 7:00 pm call Elink  [phone] 03/21/2022, 7:24 AM

## 2022-03-21 NOTE — Progress Notes (Signed)
[facility] Progress Note  Subjective: 75 cc UOP today so far. 50 cc yesterday.   Vitals:   03/21/22 1045 03/21/22 1100 03/21/22 1115 03/21/22 1130  BP:  (!) 200/84  (!) 180/108  Pulse: 96 (!) 103 (!) 101 (!) 105  Resp: (!) 6 19 (!) 21 (!) 23  Temp: 99.7 F (37.6 C) 99.7 F (37.6 C) 99.9 F (37.7 C) 99.9 F (37.7 C)  TempSrc:      SpO2: 97% (!) 89% 94% 91%  Weight:      Height:        Exam: General adult female in bed critically ill  HEENT NCAT Neck supple trachea midline Lungs coarse mechanical breath sounds Heart S1S2 no rub Abdomen soft nontender nondistended Extremities no lower extremity edema  Neuro - sedation currently running  Access right femoral nontunneled catheter in place  UA 12/30 - prot > 300,. 11-20 rbc, >50 wbc, 0-5 epi    Assessment/ Plan: AKI - crt of 0.8 in August of 2023, bland UA in October of 23.  Now with AKI , proteinuria and hematuria but also pyuria.  Seems most likely to be AKI from ATN in the setting of bacteremia. Given the proteinuria and hematuria concerning for GN serologies sent and ANA and ANCA were negative, C3 slightly low, C4 normal. Could have strep related GN - the treatment would be supportive and directed at her infection. CRRT started 1/02 and dc'd 1/08. Started back on CRRT 1/10 due to lack of signs of recovery. D/w CCM, okay to transfer to [facility] for iHD. Will keep CRRT running until there is a bed for her at [facility].  Volume - CVP 8-10 today, keeping even w/ CRRT  HTN - added prn IV hydralazine Normocytic Anemia  - deferring IV iron in setting of infection.  Aranesp 40 mcg every Thursday  Strep bacteremia-  streptococcus infantarius, on IV antibiotics per CCM Acute hypoxic respiratory failure - extubated today  Rob Maureen Duesing 03/21/2022, 12:40 PM   Recent Labs  Lab 03/20/22 0350 03/20/22 1600 03/21/22 0422  HGB 8.0*  --  7.4*  ALBUMIN 1.7* 1.9* 1.8*  CALCIUM 9.3 8.7* 7.9*  PHOS 7.6* 5.3* 4.5  CREATININE 4.39*  3.24* 2.23*  K 5.6* 5.5* 5.0    No results for input(s): "IRON", "TIBC", "FERRITIN" in the last 168 hours. Inpatient medications:  arformoterol  15 mcg Nebulization BID   budesonide (PULMICORT) nebulizer solution  0.5 mg Nebulization BID   Chlorhexidine Gluconate Cloth  6 each Topical Daily   darbepoetin (ARANESP) injection - DIALYSIS  40 mcg Subcutaneous Q Thu-1800   docusate  100 mg Per Tube BID   etomidate       feeding supplement (PROSource TF20)  60 mL Per Tube Daily   feeding supplement (VITAL 1.5 CAL)  1,000 mL Per Tube Q24H   fentaNYL       fentaNYL       heparin injection (subcutaneous)  5,000 Units Subcutaneous Q8H   insulin aspart  0-6 Units Subcutaneous Q4H   ketamine HCl       midazolam       mouth rinse  15 mL Mouth Rinse 4 times per day   pantoprazole (PROTONIX) IV  40 mg Intravenous Daily   polyethylene glycol  17 g Per Tube Daily   QUEtiapine  100 mg Per Tube QHS   revefenacin  175 mcg Nebulization Daily   rocuronium bromide       sodium chloride flush  10-40 mL Intracatheter  Q12H   sodium chloride flush  10-40 mL Intracatheter Q12H   sodium chloride HYPERTONIC  4 mL Nebulization BID   succinylcholine       Thrombi-Pad  1 each Topical Once   Vilazodone HCl  40 mg Per Tube Daily     prismasol BGK 4/2.5 400 mL/hr at 03/21/22 1055    prismasol BGK 4/2.5 400 mL/hr at 03/21/22 1053   sodium chloride Stopped (03/17/22 0701)   sodium chloride     sodium chloride 10 mL/hr at 03/21/22 1100   cefTRIAXone (ROCEPHIN)  IV Stopped (03/21/22 1004)   dexmedetomidine (PRECEDEX) IV infusion 1.2 mcg/kg/hr (03/21/22 1100)   fentaNYL infusion INTRAVENOUS Stopped (03/21/22 1031)   norepinephrine (LEVOPHED) Adult infusion Stopped (03/18/22 0054)   prismasol BGK 4/2.5 1,400 mL/hr at 03/21/22 1057   propofol (DIPRIVAN) infusion Stopped (03/21/22 0758)   vasopressin Stopped (03/17/22 0933)   Place/Maintain arterial line **AND** sodium chloride, sodium chloride, acetaminophen  (TYLENOL) oral liquid 160 mg/5 mL, albuterol, docusate, etomidate, fentaNYL, fentaNYL, fentaNYL, haloperidol lactate, heparin, ketamine HCl, labetalol, midazolam, midazolam, ondansetron (ZOFRAN) IV, mouth rinse, mouth rinse, polyethylene glycol, rocuronium bromide, sodium chloride flush, sodium chloride flush, succinylcholine

## 2022-03-21 NOTE — Progress Notes (Signed)
Vest therapy not done due to pt condition and pt on CRRT.

## 2022-03-21 NOTE — Progress Notes (Signed)
CRRT ended at 1925; 220 returned to pt; red and blue lines hep locked with 1.21mL heparin each per [facility]; Report given bedside to [facility]; 64M called to let unit know the pt left with [facility] at [place].

## 2022-03-21 NOTE — Progress Notes (Addendum)
RT placed pt on HHF [place] due to low sats and wob.

## 2022-03-22 ENCOUNTER — Inpatient Hospital Stay (HOSPITAL_COMMUNITY): Payer: Medicaid Other

## 2022-03-22 DIAGNOSIS — G9340 Encephalopathy, unspecified: Secondary | ICD-10-CM

## 2022-03-22 DIAGNOSIS — A419 Sepsis, unspecified organism: Secondary | ICD-10-CM | POA: Diagnosis not present

## 2022-03-22 DIAGNOSIS — R6521 Severe sepsis with septic shock: Secondary | ICD-10-CM | POA: Diagnosis not present

## 2022-03-22 LAB — SURGICAL PCR SCREEN
MRSA, PCR: NEGATIVE
Staphylococcus aureus: NEGATIVE

## 2022-03-22 LAB — CBC
HCT: 19 % — ABNORMAL LOW (ref 36.0–46.0)
HCT: 24.1 % — ABNORMAL LOW (ref 36.0–46.0)
Hemoglobin: 6.5 g/dL — CL (ref 12.0–15.0)
Hemoglobin: 8.3 g/dL — ABNORMAL LOW (ref 12.0–15.0)
MCH: 33.2 pg (ref 26.0–34.0)
MCH: 32.2 pg (ref 26.0–34.0)
MCHC: 34.2 g/dL (ref 30.0–36.0)
MCHC: 34.4 g/dL (ref 30.0–36.0)
MCV: 96.9 fL (ref 80.0–100.0)
MCV: 93.4 fL (ref 80.0–100.0)
Platelets: 357 10*3/uL (ref 150–400)
Platelets: 399 10*3/uL (ref 150–400)
RBC: 1.96 MIL/uL — ABNORMAL LOW (ref 3.87–5.11)
RBC: 2.58 MIL/uL — ABNORMAL LOW (ref 3.87–5.11)
RDW: 15.6 % — ABNORMAL HIGH (ref 11.5–15.5)
RDW: 17.2 % — ABNORMAL HIGH (ref 11.5–15.5)
WBC: 16.7 10*3/uL — ABNORMAL HIGH (ref 4.0–10.5)
WBC: 18.2 10*3/uL — ABNORMAL HIGH (ref 4.0–10.5)
nRBC: 0.1 % (ref 0.0–0.2)
nRBC: 0 % (ref 0.0–0.2)

## 2022-03-22 LAB — POCT I-STAT 7, (LYTES, BLD GAS, ICA,H+H)
Acid-base deficit: 4 mmol/L — ABNORMAL HIGH (ref 0.0–2.0)
Bicarbonate: 21.1 mmol/L (ref 20.0–28.0)
Calcium, Ion: 1.27 mmol/L (ref 1.15–1.40)
HCT: 21 % — ABNORMAL LOW (ref 36.0–46.0)
Hemoglobin: 7.1 g/dL — ABNORMAL LOW (ref 12.0–15.0)
O2 Saturation: 92 %
Patient temperature: 38.2
Potassium: 5.1 mmol/L (ref 3.5–5.1)
Sodium: 136 mmol/L (ref 135–145)
TCO2: 22 mmol/L (ref 22–32)
pCO2 arterial: 37 mmHg (ref 32–48)
pH, Arterial: 7.369 (ref 7.35–7.45)
pO2, Arterial: 71 mmHg — ABNORMAL LOW (ref 83–108)

## 2022-03-22 LAB — RENAL FUNCTION PANEL
Albumin: 1.6 g/dL — ABNORMAL LOW (ref 3.5–5.0)
Anion gap: 9 (ref 5–15)
BUN: 44 mg/dL — ABNORMAL HIGH (ref 6–20)
CO2: 21 mmol/L — ABNORMAL LOW (ref 22–32)
Calcium: 8.3 mg/dL — ABNORMAL LOW (ref 8.9–10.3)
Chloride: 105 mmol/L (ref 98–111)
Creatinine, Ser: 2.74 mg/dL — ABNORMAL HIGH (ref 0.44–1.00)
GFR, Estimated: 20 mL/min — ABNORMAL LOW (ref >60)
Glucose, Bld: 139 mg/dL — ABNORMAL HIGH (ref 70–99)
Phosphorus: 4.4 mg/dL (ref 2.5–4.6)
Potassium: 4.7 mmol/L (ref 3.5–5.1)
Sodium: 135 mmol/L (ref 135–145)

## 2022-03-22 LAB — PREPARE RBC (CROSSMATCH)

## 2022-03-22 LAB — GLUCOSE, CAPILLARY
Glucose-Capillary: 140 mg/dL — ABNORMAL HIGH (ref 70–99)
Glucose-Capillary: 142 mg/dL — ABNORMAL HIGH (ref 70–99)
Glucose-Capillary: 154 mg/dL — ABNORMAL HIGH (ref 70–99)
Glucose-Capillary: 160 mg/dL — ABNORMAL HIGH (ref 70–99)
Glucose-Capillary: 204 mg/dL — ABNORMAL HIGH (ref 70–99)
Glucose-Capillary: 207 mg/dL — ABNORMAL HIGH (ref 70–99)

## 2022-03-22 LAB — ABO/RH: ABO/RH(D): A POS

## 2022-03-22 MED ORDER — CHLORHEXIDINE GLUCONATE CLOTH 2 % EX PADS
6.0000 | MEDICATED_PAD | Freq: Once | CUTANEOUS | Status: AC
  Administered 2022-03-22: 6 via TOPICAL

## 2022-03-22 MED ORDER — DARBEPOETIN ALFA 40 MCG/0.4ML IJ SOSY
40.0000 ug | PREFILLED_SYRINGE | INTRAMUSCULAR | Status: DC
  Administered 2022-03-22 – 2022-03-29 (×2): 40 ug via SUBCUTANEOUS
  Filled 2022-03-22 (×3): qty 0.4

## 2022-03-22 MED ORDER — LIP MEDEX EX OINT
TOPICAL_OINTMENT | CUTANEOUS | Status: DC | PRN
  Administered 2022-03-22 – 2022-03-25 (×2): 75 via TOPICAL
  Filled 2022-03-22: qty 7

## 2022-03-22 MED ORDER — SODIUM CHLORIDE 0.9 % IV SOLN: INTRAVENOUS | Status: DC

## 2022-03-22 MED ORDER — SODIUM CHLORIDE 0.9% IV SOLUTION: Freq: Once | INTRAVENOUS | Status: AC

## 2022-03-22 NOTE — Progress Notes (Addendum)
[facility] Progress Note  Subjective: UOP slightly better at 150 cc yesterday. I/O net were -1.1 L yest. Remains stable on [place] O2 and precedex.   Vitals:   03/22/22 1000 03/22/22 1045 03/22/22 1100 03/22/22 1110  BP: 139/84 95/68 109/70 102/69  Pulse: (!) 104 95 93 93  Resp: 19 19 (!) 22 20  Temp: 99.9 F (37.7 C) 99.9 F (37.7 C) 99.9 F (37.7 C) 99.9 F (37.7 C)  TempSrc:  Bladder Bladder Bladder  SpO2: 95% 97% 97% 98%  Weight:      Height:        Exam: Gen: adult female, sedated HEENT NCAT Neck supple trachea midline Lungs bilat rhonchi o/w generally CTA Heart S1S2 no rub Abdomen soft nontender nondistended Extremities no lower extremity edema  Neuro - sedated Access right femoral nontunneled catheter in place  UA 12/30 - prot > 300,. 11-20 rbc, >50 wbc, 0-5 epi UA 03/21/22 - prot 100, >50 rbc, >50 wbc, rare bact  ANA, ANCA, C4 wnl, C3 slightly low  Assessment/ Plan: AKI - crt of 0.8 in August of 2023, bland UA in October of 23.  Now with AKI , proteinuria and hematuria but also pyuria.  Seems most likely to be AKI from ATN in the setting of bacteremia. Given the proteinuria and hematuria concerning for GN, some serologies were sent and were generally negative (see above). Could have strep related GN - the treatment would be supportive and directed at her infection. SP CRRT 1/02- 1/11. Has been tx'd to [facility] now, UOP low but improving and urine is much lighter. Labs are stable, no need for iHD today. Will follow.  Volume - CVP 8-10, weights are down and recurrent infiltrates on CXR's here are likely infectious/ ARDS rather than vol overload. Will start cautious IVF's w/ NS at 0.9%.  HTN - prn IV hydralazine Normocytic Anemia  - deferring IV iron in setting of infection.  Aranesp 40 mcg every Thursday  Strep bacteremia-  streptococcus infantarius, on IV antibiotics per CCM Acute hypoxic respiratory failure - was extubated on 1/10, on high level  O2 now.  Repeat CXR's show persistent bilat infiltrates.   Rob Kevionna Heffler 03/22/2022, 11:13 AM   Recent Labs  Lab 03/21/22 1730 03/22/22 0101 03/22/22 0730 03/22/22 0801  HGB  --  7.1* 6.5*  --   ALBUMIN 1.9*  --   --  1.6*  CALCIUM 8.0*  --   --  8.3*  PHOS 3.2  --   --  4.4  CREATININE 1.79*  --   --  2.74*  K 5.0 5.1  --  4.7    No results for input(s): "IRON", "TIBC", "FERRITIN" in the last 168 hours. Inpatient medications:  sodium chloride   Intravenous Once   arformoterol  15 mcg Nebulization BID   budesonide (PULMICORT) nebulizer solution  0.5 mg Nebulization BID   Chlorhexidine Gluconate Cloth  6 each Topical Daily   darbepoetin (ARANESP) injection - DIALYSIS  40 mcg Subcutaneous Q Thu-1800   docusate  100 mg Per Tube BID   feeding supplement (PROSource TF20)  60 mL Per Tube Daily   feeding supplement (VITAL 1.5 CAL)  1,000 mL Per Tube Q24H   heparin injection (subcutaneous)  5,000 Units Subcutaneous Q8H   insulin aspart  0-6 Units Subcutaneous Q4H   mouth rinse  15 mL Mouth Rinse 4 times per day   pantoprazole (PROTONIX) IV  40 mg Intravenous Daily   polyethylene glycol  17 g Per Tube  Daily   QUEtiapine  100 mg Per Tube QHS   revefenacin  175 mcg Nebulization Daily   sodium chloride flush  10-40 mL Intracatheter Q12H   Thrombi-Pad  1 each Topical Once   Vilazodone HCl  40 mg Per Tube Daily    sodium chloride Stopped (03/17/22 0701)   sodium chloride     sodium chloride 10 mL/hr at 03/22/22 1100   sodium chloride     cefTRIAXone (ROCEPHIN)  IV Stopped (03/22/22 1011)   dexmedetomidine (PRECEDEX) IV infusion 1 mcg/kg/hr (03/22/22 1100)   Place/Maintain arterial line **AND** sodium chloride, sodium chloride, acetaminophen (TYLENOL) oral liquid 160 mg/5 mL, albuterol, docusate, haloperidol lactate, labetalol, lip balm, ondansetron (ZOFRAN) IV, polyethylene glycol, sodium chloride flush

## 2022-03-22 NOTE — Progress Notes (Signed)
Pt's labs noted to be d/c'd by phlebotomy. Elink informed and labs re-ordered.

## 2022-03-22 NOTE — Progress Notes (Signed)
NAME:  Darlene Barber, MRN:  [id number], DOB:  02-20-1966, LOS: 12 ADMISSION DATE:  03/10/2022, CONSULTATION DATE:  12/30  REFERRING MD:  Dr. Cinda Barber, CHIEF COMPLAINT:  Abdominal Pain    History of Present Illness:  [name] who presented to the [facility] ER on 12/31 with reports of abdominal pain and widespread rash.  On admission, the patient significant other reported that the day after eating at a local restaurant in [place], [place] she developed abdominal pain and rash.  She was reportedly up all night with crampy abdominal pain associated with nausea and vomiting.  Around 2 PM on the day of admission she noticed a rash all over her body and had worsening pain prompting him to call EMS.  She was evaluated in the emergency room and was found to have oxygen saturations of 87% on 2 L, afebrile with an elevated white blood cell count of 29.2.  Additional labs notable for acute kidney injury, anion gap metabolic acidosis and LFTs with mixed hepatic and cholestatic picture.  Stool samples were sent with workup of acute diarrhea.  Stool pathogen panel was negative.  C. difficile toxin was negative.  She was treated with 30 mL/kg IV fluid and started on empiric broad-spectrum antibiotics with vancomycin, cefepime and Flagyl.  She remained hypotensive despite IV fluid boluses and was started on vasopressors.  She was transferred to [facility] as there were no inpatient beds at [facility].    PCCM consulted for ICU admission.  Pertinent  Medical History  Chronic Pain Syndrome  Cervical Spondylitis  GERD  Bipolar Disorder  Tobacco Abuse - daily  THC Abuse - daily   Significant Hospital Events: Including procedures, antibiotic start and stop dates in addition to other pertinent events   12/31 admit with abdominal pain, diarrhea.  Left IJ TLC placement.  CT chest abdomen pelvis with fluid throughout the stomach, small bowel and colon, nonspecific but may represent diarrheal state.  No bowel obstruction,  definite bowel wall thickening or pneumoperitoneum.  Fluid within the esophagus may represent reflux or nonspecific esophageal motility disorder.  Trace amount of free pelvic fluid, nonspecific.  UDS positive for tricyclics, THC and amphetamines.  C. difficile negative, GI PCR negative.  COVID, flu, RSV negative.  Antibiotics narrowed to Zosyn. BC positives for strep infantarius.  1/3 CRRT, PEEP 5/60% 1/4 CRRT with negative balance. Precedex added to Fentanyl.  1/5 CT chest abd pelvis, TEE done with no evidence of vegetations 1/8 Taken off CRRT, MIVF started 1/9 too wild on precedex, severe HTN prevented extubation 1/10 restarted on CRRT 1/11 extubated, transferred from [facility] for iHD  Interim History / Subjective:  Febrile overnight T max 101.62F. Plan for HD today  Objective   Blood pressure (!) 148/88, pulse 100, temperature 100 F (37.8 C), resp. rate 20, height 4\' 10"  (1.473 m), weight 76.6 kg, SpO2 93 %. CVP:  [4 mmHg-20 mmHg] 20 mmHg  Vent Mode: PSV;CPAP FiO2 (%):  [40 %-80 %] 55 % PEEP:  [8 cmH20] 8 cmH20 Pressure Support:  [5 cmH20] 5 cmH20   Intake/Output Summary (Last 24 hours) at 03/22/2022 0701 Last data filed at 03/22/2022 0600 Gross per 24 hour  Intake 1412.65 ml  Output 1875 ml  Net -462.35 ml    Filed Weights   03/20/22 0500 03/21/22 0500 03/22/22 0300  Weight: 83.3 kg 80.4 kg 76.6 kg    Examination: General appearance: 57 y.o., female,  chronically ill appearing, appears restless Eyes: not tracking HENT: [place],  AT Lungs: Clear to auscultation, no wheezing or crackles CV: RRR, no murmur  Abdomen: Soft, non-tender; non-distended, BS present  Extremities: traceperipheral edema, warm Neuro: Awake, not following commands, moves all 4 extermities  Resolved Hospital Problem list   Hypotension - sepsis + sedation  Transaminitis Shock Liver Rash, Hives TCP related to CRRT, sepsis Multifocal pneumonia, possible VAP, completed course 1/11 Possible melena  Assessment  & Plan:  Sepsis in setting of Streptococcus Infantarius Bacteremia  Acute Diarrhea  Febrile overnight -Repeat blood cultures on 1/3 negative x 5 days. WBC up to 16 but overal downtrending -continue ceftriaxone to complete 2 week course 1/17 -No endocarditis on TEE -Needs coloscopy as outpatient  Hypoxemic Respiratory Failure  Now on HFNC, CXR with worsening bilateral airspace infiltrates, possible component of aspiration Continue Brovana, Pulmicort Pulmonary hygiene  Acute Metabolic / Toxic Encephalopathy  Awake not following commands, likely due to sedating medications and renal failure, off fentanyl on precedex -PAD protocol with precedex stop midazolam  -Haldol PRN  Normocytic anemia Hemoglobin down to 6.5 this morning. No obvious signs of bleeding.  Concern for melena earlier this admission. Stools appear dark, clot with suctioning this morning. Possible GI bleed vs due to intubation/heated high flow -transfuse 1 unit pRBCs, follow post transfusion CBC -monitor hemoglobin -continue protonix 40 mg IV  Oliguric Renal Failure, likely ATN   FeNA 4.6% suggestive of post renal however she has a foley. Cr elevated to 2.74  with BUN 44 this morning.  -Nephro following, plan for HD today -Trend BMP, monitor I/Os -Replace electrolytes as indicated -Avoid nephrotoxic agents, ensure adequate renal perfusion  UDS Positive: Amphetamines, Cannabis, TCA's  -cessation counseling when able   Bipolar Disorder Chronic Pain  -continue home vilazodone, seroquel at reduced dose   Hyperglycemia -SSI  At Risk Malnutrition  Continue TF until mental status improves  Best Practice (right click and "Reselect all SmartList Selections" daily)  Diet/type: tubefeeds  DVT prophylaxis: prophylactic heparin  GI prophylaxis: PPI Lines: Central line and Dialysis Catheter (PICC 1/10-, R fem HD cath 1/2-)   Foley:  Yes, and it is still needed Code Status:  full code Last date of multidisciplinary  goals of care discussion: Discussed with son POA Darlene Barber 03/19/22. We would reintubate if fails extubation with plan for tracheostomy. Updated boyfriend at beside 1/10  Iona Beard, MD 03/22/22 9:02 AM IMTS PGY-3 Pager [phone]

## 2022-03-22 NOTE — Progress Notes (Signed)
Nutrition Follow-up  DOCUMENTATION CODES:   Obesity unspecified  INTERVENTION:  Once cortrak tube confirmed, can resume TF: Vital 1.5 at 40 ml/h (960 ml per day) Prosource TF20 60 ml daily Provides 1520 kcal, 85 gm protein, 733 ml free water daily   NUTRITION DIAGNOSIS:  Inadequate oral intake related to inability to eat as evidenced by NPO status (on vent). - remains applicable  GOAL:  Patient will meet greater than or equal to 90% of their needs - progressing  MONITOR:  Vent status, TF tolerance, Labs, Weight trends  REASON FOR ASSESSMENT:  Consult Enteral/tube feeding initiation and management (trickle TF with recs)  ASSESSMENT:  57 yo female w/ pertinent PMH HTN, T2DM, OSA, urge incontinence, bipolar depression presents to ED on 12/30 w/ rash and sob. Found to have sepsis and acute hypoxemic respiratory failure.  12/30 Admit 1/1: intubated, Vital HP at 85mL/hr started 1/2 changed to Vital 1.5 at 12mL/hr 1/3 CRRT started 1/4 advanced to goal of Vital 1.5 at 105mL/hr 1/8 off CRRT, remains at goal TF 1/10 restarting CRRT 1/11 - transferred to [facility] for iHD    Patient resting in bed at the time of assessment. Transferred from [employer]. Small bore tube dislodged at some point and NGT was placed overnight. Pt had tube laying on abdomen at the time of visit, placed cortrak order.   Significant peeling noted to hands. Partner at bedside reports it was attributed to extreme edema which has now improved. Pt on HFNC at this time, does not respond to questions but did stick her tongue out when asked.   Severe muscle depletions noted to the legs. Significant other states appetite was not great prior to admission. States at one point pt weight 220 lb but lost down to 120lbs. Only eats 1 meal a day (hamburger helper or something similar) and the rest of the time will snack on items like crackers or cheese balls.    Intake/Output Summary (Last 24 hours) at 03/22/2022  1257 Last data filed at 03/22/2022 1200 Gross per 24 hour  Intake 1039.05 ml  Output 1315 ml  Net -275.95 ml  Net IO Since Admission: 3,899.05 mL [03/22/22 1257]  Nutritionally Relevant Medications: Scheduled Meds:  docusate  100 mg Per Tube BID   PROSource TF20  60 mL Per Tube Daily   VITAL 1.5 CAL  1,000 mL Per Tube Q24H   insulin aspart  0-6 Units Subcutaneous Q4H   mouth rinse  15 mL Mouth Rinse 4 times per day   PROTONIX IV  40 mg Intravenous Daily   polyethylene glycol  17 g Per Tube Daily   Continuous Infusions:  cefTRIAXone (ROCEPHIN)  IV 2 g (03/22/22 0941)   dexmedetomidine IV infusion 1 mcg/kg/hr (03/22/22 0900)   PRN Meds: docusate, ondansetron, polyethylene glycol  Labs Reviewed: BUN 44, creatinine 2.74  NUTRITION - FOCUSED PHYSICAL EXAM: Flowsheet Row Most Recent Value  Orbital Region Mild depletion  Upper Arm Region No depletion  Thoracic and Lumbar Region No depletion  Buccal Region No depletion  Temple Region Mild depletion  Clavicle Bone Region No depletion  Clavicle and Acromion Bone Region No depletion  Scapular Bone Region No depletion  Dorsal Hand No depletion  Patellar Region Severe depletion  Anterior Thigh Region Severe depletion  Posterior Calf Region Severe depletion  Edema (RD Assessment) Mild  Hair --  [thinning]  Eyes Reviewed  Mouth Reviewed  [pale tongue and gums]  Skin --  [severe peeling to both hands]  Nails --  [  thin]   Diet Order:   Diet Order             Diet NPO time specified  Diet effective now                   EDUCATION NEEDS:  Not appropriate for education at this time  Skin:  Skin Assessment: Reviewed RN Assessment Skin Integrity Issues:: Other (Comment) Other: skin tears L knee  Last BM:  1/10  Height:  Ht Readings from Last 1 Encounters:  03/12/22 4\' 10"  (1.473 m)    Weight:  Wt Readings from Last 1 Encounters:  03/22/22 76.6 kg    Ideal Body Weight:  43.9 kg  BMI:  Body mass index is 35.29  kg/m.  Estimated Nutritional Needs:  Kcal:  1500-1700 kcals Protein:  80-90 grams Fluid:  >/= 1.5L    Ranell Patrick, RD, LDN Clinical Dietitian RD pager # available in AM[facility]  After hours/weekend pager # available in [facility]

## 2022-03-22 NOTE — Procedures (Signed)
Cortrak  Tube Type:  Cortrak - 43 inches Tube Location:  Right nare Secured by: Bridle Technique Used to Measure Tube Placement:  Marking at nare/corner of mouth Cortrak Secured At:  60 cm   Cortrak Tube Team Note:  Consult received to place a Cortrak feeding tube.   X-ray is required, abdominal x-ray has been ordered by the Cortrak team. Please confirm tube placement before using the Cortrak tube.   If the tube becomes dislodged please keep the tube and contact the Cortrak team at [email or web address] for replacement.  If after hours and replacement cannot be delayed, place a NG tube and confirm placement with an abdominal x-ray.    Koleen Distance MS, RD, LDN Please refer to [facility] for RD and/or RD on-call/weekend/after hours pager

## 2022-03-22 NOTE — Progress Notes (Signed)
[facility] Progress Note Patient Name: KIMIE PIDCOCK DOB: [birth year] MRN: [id number]   Date of Service  03/22/2022  HPI/Events of Note  ABG on HHFNC = 7.36/37/71/21  eICU Interventions  Continue present management.      Intervention Category Major Interventions: Hypoxemia - evaluation and management  Asriel Westrup [place] 03/22/2022, 1:35 AM

## 2022-03-22 NOTE — Progress Notes (Signed)
[facility] Progress Note Patient Name: Darlene Barber DOB: [birth year] MRN: [id number]   Date of Service  03/22/2022  HPI/Events of Note  Hypoxia - Patient extubated today and was hypoxic post extubation. Nursing / RT reports that patient has ALOC. Blood glucose 1 hour ago = 150. Sat = 99% and RR = 24 on 20 L/min HHFNC.  eICU Interventions  Plan: ABG STAT. Portable CXR at 5 AM.     Intervention Category Major Interventions: Hypoxemia - evaluation and management  Lysle Dingwall 03/22/2022, 12:49 AM

## 2022-03-23 DIAGNOSIS — A419 Sepsis, unspecified organism: Secondary | ICD-10-CM | POA: Diagnosis not present

## 2022-03-23 DIAGNOSIS — R6521 Severe sepsis with septic shock: Secondary | ICD-10-CM | POA: Diagnosis not present

## 2022-03-23 LAB — TYPE AND SCREEN
ABO/RH(D): A POS
Antibody Screen: NEGATIVE
Unit division: 0

## 2022-03-23 LAB — BASIC METABOLIC PANEL
Anion gap: 13 (ref 5–15)
BUN: 64 mg/dL — ABNORMAL HIGH (ref 6–20)
CO2: 18 mmol/L — ABNORMAL LOW (ref 22–32)
Calcium: 8.5 mg/dL — ABNORMAL LOW (ref 8.9–10.3)
Chloride: 108 mmol/L (ref 98–111)
Creatinine, Ser: 3.91 mg/dL — ABNORMAL HIGH (ref 0.44–1.00)
GFR, Estimated: 13 mL/min — ABNORMAL LOW (ref >60)
Glucose, Bld: 179 mg/dL — ABNORMAL HIGH (ref 70–99)
Potassium: 4.9 mmol/L (ref 3.5–5.1)
Sodium: 139 mmol/L (ref 135–145)

## 2022-03-23 LAB — GLUCOSE, CAPILLARY
Glucose-Capillary: 141 mg/dL — ABNORMAL HIGH (ref 70–99)
Glucose-Capillary: 188 mg/dL — ABNORMAL HIGH (ref 70–99)
Glucose-Capillary: 188 mg/dL — ABNORMAL HIGH (ref 70–99)
Glucose-Capillary: 208 mg/dL — ABNORMAL HIGH (ref 70–99)
Glucose-Capillary: 212 mg/dL — ABNORMAL HIGH (ref 70–99)
Glucose-Capillary: 218 mg/dL — ABNORMAL HIGH (ref 70–99)

## 2022-03-23 LAB — CBC
HCT: 23.2 % — ABNORMAL LOW (ref 36.0–46.0)
Hemoglobin: 7.8 g/dL — ABNORMAL LOW (ref 12.0–15.0)
MCH: 31.5 pg (ref 26.0–34.0)
MCHC: 33.6 g/dL (ref 30.0–36.0)
MCV: 93.5 fL (ref 80.0–100.0)
Platelets: 435 10*3/uL — ABNORMAL HIGH (ref 150–400)
RBC: 2.48 MIL/uL — ABNORMAL LOW (ref 3.87–5.11)
RDW: 17.6 % — ABNORMAL HIGH (ref 11.5–15.5)
WBC: 16.6 10*3/uL — ABNORMAL HIGH (ref 4.0–10.5)
nRBC: 0 % (ref 0.0–0.2)

## 2022-03-23 LAB — BPAM RBC
Blood Product Expiration Date: 202401172359
ISSUE DATE / TIME: 202401121052
Unit Type and Rh: 6200

## 2022-03-23 MED ORDER — QUETIAPINE FUMARATE 50 MG PO TABS
50.0000 mg | ORAL_TABLET | Freq: Two times a day (BID) | ORAL | Status: DC
  Administered 2022-03-23 – 2022-03-28 (×11): 50 mg
  Filled 2022-03-23 (×11): qty 1

## 2022-03-23 MED ORDER — INSULIN ASPART 100 UNIT/ML IJ SOLN
0.0000 [IU] | INTRAMUSCULAR | Status: DC
  Administered 2022-03-23: 5 [IU] via SUBCUTANEOUS
  Administered 2022-03-23: 2 [IU] via SUBCUTANEOUS
  Administered 2022-03-24 (×6): 3 [IU] via SUBCUTANEOUS
  Administered 2022-03-25: 2 [IU] via SUBCUTANEOUS
  Administered 2022-03-25 (×4): 3 [IU] via SUBCUTANEOUS
  Administered 2022-03-25: 2 [IU] via SUBCUTANEOUS
  Administered 2022-03-26 (×2): 3 [IU] via SUBCUTANEOUS
  Administered 2022-03-26: 2 [IU] via SUBCUTANEOUS
  Administered 2022-03-26 (×2): 3 [IU] via SUBCUTANEOUS
  Administered 2022-03-26 – 2022-03-27 (×4): 2 [IU] via SUBCUTANEOUS
  Administered 2022-03-27 (×2): 3 [IU] via SUBCUTANEOUS
  Administered 2022-03-28: 5 [IU] via SUBCUTANEOUS
  Administered 2022-03-28: 2 [IU] via SUBCUTANEOUS
  Administered 2022-03-28 (×2): 3 [IU] via SUBCUTANEOUS
  Administered 2022-03-28 – 2022-03-29 (×2): 2 [IU] via SUBCUTANEOUS
  Administered 2022-03-29: 3 [IU] via SUBCUTANEOUS
  Administered 2022-03-29 – 2022-04-04 (×6): 2 [IU] via SUBCUTANEOUS

## 2022-03-23 MED ORDER — CLONAZEPAM 0.5 MG PO TABS
0.5000 mg | ORAL_TABLET | Freq: Two times a day (BID) | ORAL | Status: DC
  Administered 2022-03-23 – 2022-03-24 (×3): 0.5 mg
  Filled 2022-03-23 (×3): qty 1

## 2022-03-23 NOTE — Progress Notes (Signed)
NAME:  Darlene Barber, MRN:  [id number], DOB:  Jul 16, 1965, LOS: 82 ADMISSION DATE:  03/10/2022, CONSULTATION DATE:  12/30  REFERRING MD:  Dr. Cinda Quest, CHIEF COMPLAINT:  Abdominal Pain    History of Present Illness:  [name] who presented to the [facility] ER on 12/31 with reports of abdominal pain and widespread rash.  On admission, the patient significant other reported that the day after eating at a local restaurant in [place], [place] she developed abdominal pain and rash.  She was reportedly up all night with crampy abdominal pain associated with nausea and vomiting.  Around 2 PM on the day of admission she noticed a rash all over her body and had worsening pain prompting him to call EMS.  She was evaluated in the emergency room and was found to have oxygen saturations of 87% on 2 L, afebrile with an elevated white blood cell count of 29.2.  Additional labs notable for acute kidney injury, anion gap metabolic acidosis and LFTs with mixed hepatic and cholestatic picture.  Stool samples were sent with workup of acute diarrhea.  Stool pathogen panel was negative.  C. difficile toxin was negative.  She was treated with 30 mL/kg IV fluid and started on empiric broad-spectrum antibiotics with vancomycin, cefepime and Flagyl.  She remained hypotensive despite IV fluid boluses and was started on vasopressors.  She was transferred to [facility] as there were no inpatient beds at [facility].    PCCM consulted for ICU admission.  Pertinent  Medical History  Chronic Pain Syndrome  Cervical Spondylitis  GERD  Bipolar Disorder  Tobacco Abuse - daily  THC Abuse - daily   Significant Hospital Events: Including procedures, antibiotic start and stop dates in addition to other pertinent events   12/31 admit with abdominal pain, diarrhea.  Left IJ TLC placement.  CT chest abdomen pelvis with fluid throughout the stomach, small bowel and colon, nonspecific but may represent diarrheal state.  No bowel obstruction,  definite bowel wall thickening or pneumoperitoneum.  Fluid within the esophagus may represent reflux or nonspecific esophageal motility disorder.  Trace amount of free pelvic fluid, nonspecific.  UDS positive for tricyclics, THC and amphetamines.  C. difficile negative, GI PCR negative.  COVID, flu, RSV negative.  Antibiotics narrowed to Zosyn. BC positives for strep infantarius.  1/3 CRRT, PEEP 5/60% 1/4 CRRT with negative balance. Precedex added to Fentanyl.  1/5 CT chest abd pelvis, TEE done with no evidence of vegetations 1/8 Taken off CRRT, MIVF started 1/9 too wild on precedex, severe HTN prevented extubation 1/10 restarted on CRRT 1/11 extubated, transferred from [facility] for iHD  Interim History / Subjective:  Febrile overnight T max 100.8. Agitated overnight mumbling in sleep. No report further signs of bleeding  Objective   Blood pressure (!) 156/85, pulse 96, temperature (!) 100.4 F (38 C), resp. rate (!) 27, height 4\' 10"  (1.473 m), weight 76.6 kg, SpO2 94 %. CVP:  [8 mmHg] 8 mmHg  FiO2 (%):  [40 %-55 %] 40 %   Intake/Output Summary (Last 24 hours) at 03/23/2022 0658 Last data filed at 03/23/2022 0600 Gross per 24 hour  Intake 2845.82 ml  Output 1580 ml  Net 1265.82 ml    Filed Weights   03/20/22 0500 03/21/22 0500 03/22/22 0300  Weight: 83.3 kg 80.4 kg 76.6 kg    Examination: General appearance:  chronically ill appearing, sleeping in bed in no acute distress Eyes: tracking HENT: [place], AT Lungs: Clear to auscultation, no wheezing  or crackles CV: RRR, no murmur  Abdomen: Soft, non-tender; non-distended, BS present  Extremities: no lower extremity edema, warm Neuro: awakens to voice but quickly falls asleep, moving all 4 extremities, not following commands, no verbalizing   Resolved Hospital Problem list   Hypotension - sepsis + sedation  Transaminitis Shock Liver Rash, Hives TCP related to CRRT, sepsis Multifocal pneumonia, possible VAP, completed course  1/11 Possible melena  Assessment & Plan:  Sepsis in setting of Streptococcus Infantarius Bacteremia  Acute Diarrhea  -Repeat blood cultures on 1/3 negative x 5 days. WBC up to 16 but overal downtrending -continue ceftriaxone to complete 2 week course 1/17 -No endocarditis on TEE -Needs coloscopy as outpatient  Hypoxemic Respiratory Failure  Possible aspiration pneumonia. Doing well on heated high flow. FiO2 40% -Continue to wean oxygen to [place] if able -on ceftriaxone -Continue Brovana, Pulmicort -Pulmonary hygiene  Acute Metabolic / Toxic Encephalopathy  Remains confused, awakens to voice and tracking but not following commands or speaking. Likely related to sedation in setting of poor renal function. Less likely to withdrawal  - on precedex continue to attempt to wean - add on clonazepam 0.5 mg twice daily - Seroquel 50 mg twice daily   Normocytic anemia Hemoglobin 7.8 this morning. Stools appear dark, no further signs of epistaxis   -monitor hemoglobin daily  Renal Failure, likely ATN   Worsening renal function today BUN 64 , Cr 3.91, GFR down to 13. Was started on fluids by nephrology yesterday. Increased urine production to 315 mL yesterday  -Nephro following, plan for HD today -continue IV NS  -Trend BMP, monitor I/Os -Replace electrolytes as indicated -Avoid nephrotoxic agents, ensure adequate renal perfusion  UDS Positive: Amphetamines, Cannabis, TCA's  -cessation counseling when able  [place] discontinue outside withdrawal window  Bipolar Disorder Chronic Pain  -continue home vilazodone - change Seroquel to 50 mg twice daily to help with agitation  Hyperglycemia -SSI  At Risk Malnutrition  Continue TF until mental status improves  Best Practice (right click and "Reselect all SmartList Selections" daily)  Diet/type: tubefeeds  DVT prophylaxis: prophylactic heparin  GI prophylaxis: PPI Lines: Central line and Dialysis Catheter (PICC 1/10-, R fem HD cath 1/2-)    Foley:  Yes, and it is still needed Code Status:  full code Last date of multidisciplinary goals of care discussion: Discussed with son POA Kazzandra Desaulniers 03/19/22. We would reintubate if fails extubation with plan for tracheostomy. Updated boyfriend at beside 1/10  Iona Beard, MD 03/23/22 6:58 AM IMTS PGY-3 Pager [phone]

## 2022-03-23 NOTE — Progress Notes (Signed)
[facility] Progress Note  Subjective: UOP 89 yest and 89 so far today. Creat 3.9 today  Vitals:   03/23/22 0430 03/23/22 0500 03/23/22 0530 03/23/22 0600  BP: (!) 157/81 124/83 (!) 172/93 (!) 156/85  Pulse: 94 87 99 96  Resp: (!) 29 (!) 32 (!) 26 (!) 27  Temp: (!) 100.6 F (38.1 C) (!) 100.8 F (38.2 C) (!) 100.4 F (38 C) (!) 100.4 F (38 C)  TempSrc:      SpO2: 94% 95% 94% 94%  Weight:      Height:        Exam: Gen: adult female, sedated HEENT NCAT Neck supple trachea midline Lungs bilat rhonchi o/w generally CTA Heart S1S2 no rub Abdomen soft nontender nondistended Extremities no lower extremity edema  Neuro - sedated Access right femoral nontunneled catheter in place  UA 12/30 - prot > 300,. 11-20 rbc, >50 wbc, 0-5 epi UA 03/21/22 - prot 100, >50 rbc, >50 wbc, rare bact  ANA, ANCA, C4 wnl, C3 slightly low  Assessment/ Plan: AKI - crt of 0.8 in August of 2023, bland UA in October of 23.  Now with AKI , proteinuria and hematuria but also pyuria.  Seems most likely to be AKI from ATN in the setting of bacteremia. Given the proteinuria and hematuria concerning for GN, some serologies were sent and were generally negative (see above). Could have strep related GN - the treatment would be supportive and directed at her infection. SP CRRT 1/02- 1/11. Tx'd to [facility] 1/11. UOP low. Weights down, cautious IVF's started for suspected vol depletion. UOP improving, creat 3.9 today. Will hold off on HD for today, follow UOP and creatinine.  Volume - CVP 8-10, weights are down and recurrent infiltrates on CXR's here are likely infectious/ ARDS related. Started cautious IVF on 1/12. Tolerating, will cont.  HTN - prn IV hydralazine Normocytic Anemia  - deferring IV iron in setting of infection.  Aranesp 40 mcg every Thursday  Strep bacteremia-  streptococcus infantarius, on IV antibiotics per CCM Acute hypoxic respiratory failure - was extubated on 1/10, on high level [place] O2  now. Repeat CXR's show persistent bilat infiltrates.  H/o polysubstance abuse  Kelly Splinter 03/23/2022, 7:39 AM   Recent Labs  Lab 03/21/22 1730 03/22/22 0101 03/22/22 0801 03/22/22 1550 03/23/22 0349  HGB  --    < >  --  8.3* 7.8*  ALBUMIN 1.9*  --  1.6*  --   --   CALCIUM 8.0*  --  8.3*  --  8.5*  PHOS 3.2  --  4.4  --   --   CREATININE 1.79*  --  2.74*  --  3.91*  K 5.0   < > 4.7  --  4.9   < > = values in this interval not displayed.    No results for input(s): "IRON", "TIBC", "FERRITIN" in the last 168 hours. Inpatient medications:  arformoterol  15 mcg Nebulization BID   budesonide (PULMICORT) nebulizer solution  0.5 mg Nebulization BID   Chlorhexidine Gluconate Cloth  6 each Topical Daily   darbepoetin (ARANESP) injection - DIALYSIS  40 mcg Subcutaneous Q Fri-1800   docusate  100 mg Per Tube BID   feeding supplement (PROSource TF20)  60 mL Per Tube Daily   feeding supplement (VITAL 1.5 CAL)  1,000 mL Per Tube Q24H   heparin injection (subcutaneous)  5,000 Units Subcutaneous Q8H   insulin aspart  0-6 Units Subcutaneous Q4H   mouth rinse  15  mL Mouth Rinse 4 times per day   pantoprazole (PROTONIX) IV  40 mg Intravenous Daily   polyethylene glycol  17 g Per Tube Daily   QUEtiapine  100 mg Per Tube QHS   revefenacin  175 mcg Nebulization Daily   sodium chloride flush  10-40 mL Intracatheter Q12H   Thrombi-Pad  1 each Topical Once   Vilazodone HCl  40 mg Per Tube Daily    sodium chloride Stopped (03/17/22 0701)   sodium chloride     sodium chloride Stopped (03/22/22 1151)   sodium chloride 65 mL/hr at 03/23/22 0600   cefTRIAXone (ROCEPHIN)  IV Stopped (03/22/22 1011)   dexmedetomidine (PRECEDEX) IV infusion 1 mcg/kg/hr (03/23/22 0600)   Place/Maintain arterial line **AND** sodium chloride, sodium chloride, acetaminophen (TYLENOL) oral liquid 160 mg/5 mL, albuterol, docusate, haloperidol lactate, labetalol, lip balm, ondansetron (ZOFRAN) IV, polyethylene glycol,  sodium chloride flush

## 2022-03-24 DIAGNOSIS — A419 Sepsis, unspecified organism: Secondary | ICD-10-CM | POA: Diagnosis not present

## 2022-03-24 DIAGNOSIS — J9600 Acute respiratory failure, unspecified whether with hypoxia or hypercapnia: Secondary | ICD-10-CM | POA: Diagnosis not present

## 2022-03-24 DIAGNOSIS — R6521 Severe sepsis with septic shock: Secondary | ICD-10-CM | POA: Diagnosis not present

## 2022-03-24 LAB — CBC
HCT: 24.5 % — ABNORMAL LOW (ref 36.0–46.0)
Hemoglobin: 8.3 g/dL — ABNORMAL LOW (ref 12.0–15.0)
MCH: 31.7 pg (ref 26.0–34.0)
MCHC: 33.9 g/dL (ref 30.0–36.0)
MCV: 93.5 fL (ref 80.0–100.0)
Platelets: 504 10*3/uL — ABNORMAL HIGH (ref 150–400)
RBC: 2.62 MIL/uL — ABNORMAL LOW (ref 3.87–5.11)
RDW: 17.2 % — ABNORMAL HIGH (ref 11.5–15.5)
WBC: 16.3 10*3/uL — ABNORMAL HIGH (ref 4.0–10.5)
nRBC: 0.1 % (ref 0.0–0.2)

## 2022-03-24 LAB — GLUCOSE, CAPILLARY
Glucose-Capillary: 160 mg/dL — ABNORMAL HIGH (ref 70–99)
Glucose-Capillary: 172 mg/dL — ABNORMAL HIGH (ref 70–99)
Glucose-Capillary: 181 mg/dL — ABNORMAL HIGH (ref 70–99)
Glucose-Capillary: 184 mg/dL — ABNORMAL HIGH (ref 70–99)
Glucose-Capillary: 189 mg/dL — ABNORMAL HIGH (ref 70–99)
Glucose-Capillary: 199 mg/dL — ABNORMAL HIGH (ref 70–99)

## 2022-03-24 LAB — HEPATITIS B SURFACE ANTIGEN: Hepatitis B Surface Ag: NONREACTIVE

## 2022-03-24 LAB — RENAL FUNCTION PANEL
Albumin: 1.8 g/dL — ABNORMAL LOW (ref 3.5–5.0)
Anion gap: 12 (ref 5–15)
BUN: 82 mg/dL — ABNORMAL HIGH (ref 6–20)
CO2: 18 mmol/L — ABNORMAL LOW (ref 22–32)
Calcium: 8.9 mg/dL (ref 8.9–10.3)
Chloride: 112 mmol/L — ABNORMAL HIGH (ref 98–111)
Creatinine, Ser: 4.96 mg/dL — ABNORMAL HIGH (ref 0.44–1.00)
GFR, Estimated: 10 mL/min — ABNORMAL LOW (ref >60)
Glucose, Bld: 192 mg/dL — ABNORMAL HIGH (ref 70–99)
Phosphorus: 7.9 mg/dL — ABNORMAL HIGH (ref 2.5–4.6)
Potassium: 5.5 mmol/L — ABNORMAL HIGH (ref 3.5–5.1)
Sodium: 142 mmol/L (ref 135–145)

## 2022-03-24 MED ORDER — NEPRO/CARBSTEADY PO LIQD: 40.0000 mL | ORAL | Status: DC

## 2022-03-24 MED ORDER — CHLORHEXIDINE GLUCONATE CLOTH 2 % EX PADS: 6.0000 | MEDICATED_PAD | Freq: Every day | CUTANEOUS | Status: DC

## 2022-03-24 MED ORDER — LABETALOL HCL 5 MG/ML IV SOLN
INTRAVENOUS | Status: AC
  Administered 2022-03-25: 20 mg via INTRAVENOUS
  Filled 2022-03-24: qty 4

## 2022-03-24 MED ORDER — SODIUM ZIRCONIUM CYCLOSILICATE 10 G PO PACK
10.0000 g | PACK | Freq: Three times a day (TID) | ORAL | Status: AC
  Administered 2022-03-24 (×2): 10 g via ORAL
  Filled 2022-03-24 (×2): qty 1

## 2022-03-24 MED ORDER — INSULIN ASPART 100 UNIT/ML IJ SOLN
3.0000 [IU] | INTRAMUSCULAR | Status: DC
  Administered 2022-03-24 – 2022-03-31 (×28): 3 [IU] via SUBCUTANEOUS

## 2022-03-24 MED ORDER — NEPRO/CARBSTEADY PO LIQD
40.0000 mL/h | ORAL | Status: DC
  Administered 2022-03-24 – 2022-03-26 (×3): 40 mL/h
  Filled 2022-03-24: qty 237
  Filled 2022-03-24 (×3): qty 1000

## 2022-03-24 NOTE — Progress Notes (Signed)
NAME:  Darlene Barber, MRN:  [id number], DOB:  23-Aug-1965, LOS: 13 ADMISSION DATE:  03/10/2022, CONSULTATION DATE:  12/30  REFERRING MD:  Dr. Cinda Quest, CHIEF COMPLAINT:  Abdominal Pain    History of Present Illness:  [name] who presented to the [facility] ER on 12/31 with reports of abdominal pain and widespread rash.  On admission, the patient significant other reported that the day after eating at a local restaurant in [place], [place] she developed abdominal pain and rash.  She was reportedly up all night with crampy abdominal pain associated with nausea and vomiting.  Around 2 PM on the day of admission she noticed a rash all over her body and had worsening pain prompting him to call EMS.  She was evaluated in the emergency room and was found to have oxygen saturations of 87% on 2 L, afebrile with an elevated white blood cell count of 29.2.  Additional labs notable for acute kidney injury, anion gap metabolic acidosis and LFTs with mixed hepatic and cholestatic picture.  Stool samples were sent with workup of acute diarrhea.  Stool pathogen panel was negative.  C. difficile toxin was negative.  She was treated with 30 mL/kg IV fluid and started on empiric broad-spectrum antibiotics with vancomycin, cefepime and Flagyl.  She remained hypotensive despite IV fluid boluses and was started on vasopressors.  She was transferred to [facility] as there were no inpatient beds at [facility].    PCCM consulted for ICU admission.  Pertinent  Medical History  Chronic Pain Syndrome  Cervical Spondylitis  GERD  Bipolar Disorder  Tobacco Abuse - daily  THC Abuse - daily   Significant Hospital Events: Including procedures, antibiotic start and stop dates in addition to other pertinent events   12/31 admit with abdominal pain, diarrhea.  Left IJ TLC placement.  CT chest abdomen pelvis with fluid throughout the stomach, small bowel and colon, nonspecific but may represent diarrheal state.  No bowel obstruction,  definite bowel wall thickening or pneumoperitoneum.  Fluid within the esophagus may represent reflux or nonspecific esophageal motility disorder.  Trace amount of free pelvic fluid, nonspecific.  UDS positive for tricyclics, THC and amphetamines.  C. difficile negative, GI PCR negative.  COVID, flu, RSV negative.  Antibiotics narrowed to Zosyn. BC positives for strep infantarius.  1/3 CRRT, PEEP 5/60% 1/4 CRRT with negative balance. Precedex added to Fentanyl.  1/5 CT chest abd pelvis, TEE done with no evidence of vegetations 1/8 Taken off CRRT, MIVF started 1/9 too wild on precedex, severe HTN prevented extubation 1/10 restarted on CRRT 1/11 extubated, transferred from [facility] for [facility]HD  Interim History / Subjective:  Clonazepam 0.5 twice daily started on 1/13, Seroquel to twice daily Precedex weaned to off this morning Continued to have urine output, HD deferred on 1/13.  I/O+ 4.8 L total, UOP 1245 cc last 24 hours Potassium 5.5, CO2 18, SCr 4.96 <3.91 No fevers  Objective   Blood pressure (!) 169/89, pulse 86, temperature 99.1 F (37.3 C), resp. rate (!) 22, height 4\' 10"  (1.473 m), weight 77 kg, SpO2 95 %.    FiO2 (%):  [30 %-41 %] 30 %   Intake/Output Summary (Last 24 hours) at 03/24/2022 0748 Last data filed at 03/24/2022 0600 Gross per 24 hour  Intake 2615.88 ml  Output 2845 ml  Net -229.12 ml   Filed Weights   03/21/22 0500 03/22/22 0300 03/24/22 0500  Weight: 80.4 kg 76.6 kg 77 kg    Examination:  General appearance: Ill-appearing woman, in bed, somewhat anxious Eyes: Eyes open, tracks, pupils equal HENT: Some upper airway secretions with inadequate cough Lungs: Coarse bilaterally, no wheeze CV: Regular, no murmur Abdomen: Nondistended with positive bowel sounds Extremities: No edema Neuro: Awake, eyes open, tremulous especially her left lower extremity.  Tracks.  Does not follow commands.  Does try to phonate and interact  Resolved Hospital Problem list   Hypotension -  sepsis + sedation  Transaminitis Shock Liver Rash, Hives TCP related to CRRT, sepsis Multifocal pneumonia, possible VAP, completed course 1/11 Possible melena  Assessment & Plan:  Sepsis in setting of Streptococcus Infantarius Bacteremia  -Repeat blood cultures 1/3 were negative, TEE negative -Plan ceftriaxone through 1/17 -Will need colonoscopy as an outpatient as this organism can be associated with colon cancer (no evidence of this on imaging)  Hypoxemic Respiratory Failure  Possible aspiration pneumonia.  Transition to nasal cannula, -Effectively weaning oxygen but airway protection appears to be marginal.  Will allow NTS as needed, push pulmonary hygiene -Continue ceftriaxone -Continue Brovana and Pulmicort -Careful with sedating medications as below  Acute Metabolic / Toxic Encephalopathy  Remains confused, awakens to voice and tracking but not following commands or speaking. Likely related to sedation in setting of poor renal function. -Precedex has been weaned off -Clonazepam 0.5 mg twice daily started 1/13, careful to avoid oversedation -Seroquel at 50 mg twice daily, may want to uptitrate this to allow [place] to DC the clonazepam  Normocytic anemia -Follow intermittent CBC  Renal Failure, likely ATN   Improving urine output but SCr rising 1/14 -Gentle IV fluids as per nephrology recommendations -Appreciate nephrology assistance, may require more intermittent HD while we wait for renal recovery.  Urine output is encouraging -Follow BMP -Replace electrolytes as indicated  UDS Positive: Amphetamines, Cannabis, TCA's  -cessation counseling when able   Bipolar Disorder Chronic Pain  -Continue home vilazodone -Continue Seroquel to 50 mg twice daily to help with agitation (she was on 300 mg nightly at home).  May decide to uptitrate  Hyperglycemia -Sliding scale insulin  At Risk Malnutrition  -Continue tube feeding until she can pass swallowing evaluation  Best  Practice (right click and "Reselect all SmartList Selections" daily)  Diet/type: tubefeeds  DVT prophylaxis: prophylactic heparin  GI prophylaxis: PPI Lines: Central line and Dialysis Catheter (PICC 1/10-, R fem HD cath 1/2-)   Foley:  Yes, and it is still needed Code Status:  full code Last date of multidisciplinary goals of care discussion: Discussed with son POA Delaila Nand 03/19/22. We would reintubate if fails extubation with plan for tracheostomy.  [name] boyfriend updated daily  Independent critical care time 21 minutes  Baltazar Apo, MD, PhD 03/24/2022, 7:48 AM [facility] [phone] or if no answer before 7:00PM call [phone] For any issues after 7:00PM please call eLink [phone]

## 2022-03-24 NOTE — Progress Notes (Signed)
[facility] Progress Note  Subjective: RR 17-25, HFNC at 20 unchanged. Better UOP at 1145 cc yest. Labs pending.   Vitals:   03/24/22 0430 03/24/22 0500 03/24/22 0530 03/24/22 0600  BP: (!) 179/100 (!) 161/92 (!) 161/91 (!) 169/89  Pulse: 96 87 86 86  Resp: 18 16 (!) 23 (!) 22  Temp: 99.3 F (37.4 C) 99.5 F (37.5 C) 99.3 F (37.4 C) 99.1 F (37.3 C)  TempSrc:      SpO2: 99% 99% 99% 95%  Weight:  77 kg    Height:        Exam: Gen: adult female, sedated HEENT NCAT Neck supple trachea midline Lungs bilat rhonchi o/w generally CTA Heart S1S2 no rub Abdomen soft nontender nondistended Extremities no lower extremity edema  Neuro - sedated Access right femoral nontunneled catheter in place  UA 12/30 - prot > 300,. 11-20 rbc, >50 wbc, 0-5 epi UA 03/21/22 - prot 100, >50 rbc, >50 wbc, rare bact  ANA, ANCA, C4 wnl, C3 slightly low  Assessment/ Plan: AKI - crt of 0.8 in August of 2023, bland UA in October of 23.  Now with AKI , proteinuria and hematuria but also pyuria.  Seems most likely to be AKI from ATN in the setting of bacteremia. Given the proteinuria and hematuria concerning for GN, some serologies were sent and were generally negative (see above). 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 SP CRRT 1/02- 1/11. Tx'd to [facility] 1/11. UOP was low and wts were dropping down, so cautious IVF's started for suspected vol depletion. UOP improving now w/ 1145cc yesterday. Creat rising at 4.9 today w/ BUN 82, K+ 5.5 today. ATN not recovering yet, will plan on HD tomorrow.  Hyperkalemia - lokelma x 2 and switching TF's to Nepro Volume - persistent infiltrates on CXR are likely infection-related. Wt's down and poor UOP so cautious IVF's started 1/12. Tolerating w/ Daphene Calamity so will cont at 45 cc/hr.  HTN - prn IV labetalol. BP's borderline high, not on any scheduled BP lowering meds yet.  Normocytic Anemia  - deferring IV iron in  setting of infection.  Aranesp 40 mcg every Thursday  Strep bacteremia-  streptococcus infantarius, TEE negative, getting Rocephin thru 1/17. Colonoscopy when more stable per pmd.  Acute hypoxic respiratory failure - extubated on 1/10, on high level [place] O2 now. Repeat CXR's show persistent bilat infiltrates as above H/o polysubstance abuse  Rob Kynzley Dowson 03/24/2022, 6:33 AM   Recent Labs  Lab 03/21/22 1730 03/22/22 0101 03/22/22 0801 03/22/22 1550 03/23/22 0349 03/24/22 0549  HGB  --    < >  --    < > 7.8* 8.3*  ALBUMIN 1.9*  --  1.6*  --   --   --   CALCIUM 8.0*  --  8.3*  --  8.5*  --   PHOS 3.2  --  4.4  --   --   --   CREATININE 1.79*  --  2.74*  --  3.91*  --   K 5.0   < > 4.7  --  4.9  --    < > = values in this interval not displayed.    No results for input(s): "IRON", "TIBC", "FERRITIN" in the last 168 hours. Inpatient medications:  arformoterol  15 mcg Nebulization BID   budesonide (PULMICORT) nebulizer solution  0.5 mg Nebulization BID   Chlorhexidine Gluconate Cloth  6 each Topical Daily   clonazePAM  0.5 mg Per Tube BID   darbepoetin (ARANESP) injection - DIALYSIS  40 mcg Subcutaneous Q Fri-1800   docusate  100 mg Per Tube BID   feeding supplement (PROSource TF20)  60 mL Per Tube Daily   feeding supplement (VITAL 1.5 CAL)  1,000 mL Per Tube Q24H   heparin injection (subcutaneous)  5,000 Units Subcutaneous Q8H   insulin aspart  0-15 Units Subcutaneous Q4H   mouth rinse  15 mL Mouth Rinse 4 times per day   pantoprazole (PROTONIX) IV  40 mg Intravenous Daily   polyethylene glycol  17 g Per Tube Daily   QUEtiapine  50 mg Per Tube BID   revefenacin  175 mcg Nebulization Daily   sodium chloride flush  10-40 mL Intracatheter Q12H   Vilazodone HCl  40 mg Per Tube Daily    sodium chloride Stopped (03/17/22 0701)   sodium chloride     sodium chloride Stopped (03/22/22 1151)   sodium chloride 45 mL/hr at 03/24/22 0600   cefTRIAXone (ROCEPHIN)  IV Stopped (03/23/22  1008)   dexmedetomidine (PRECEDEX) IV infusion Stopped (03/24/22 0548)   Place/Maintain arterial line **AND** sodium chloride, sodium chloride, acetaminophen (TYLENOL) oral liquid 160 mg/5 mL, albuterol, docusate, haloperidol lactate, labetalol, lip balm, ondansetron (ZOFRAN) IV, polyethylene glycol, sodium chloride flush

## 2022-03-24 NOTE — Progress Notes (Signed)
PCCM Interval Note  She is more calm today, but still w mild tremor, may be intermittently following commands. Tried to speak to me but cannot understands.   With her uremia, and plan for iHD either today or tomorrow, I believe we should hold her clonazepam so she does not get over sedated. Will change this now  Baltazar Apo, MD, PhD 03/24/2022, 2:44 PM [facility] [phone] or if no answer before 7:00PM call [phone] For any issues after 7:00PM please call eLink [phone]

## 2022-03-24 NOTE — Progress Notes (Signed)
Per CCM order, RT NT suctioned pt with small amount of clear thick secretions. RN at bedside at that time as well.

## 2022-03-25 ENCOUNTER — Inpatient Hospital Stay (HOSPITAL_COMMUNITY): Payer: Medicaid Other

## 2022-03-25 DIAGNOSIS — R6521 Severe sepsis with septic shock: Secondary | ICD-10-CM | POA: Diagnosis not present

## 2022-03-25 DIAGNOSIS — A419 Sepsis, unspecified organism: Secondary | ICD-10-CM | POA: Diagnosis not present

## 2022-03-25 LAB — RENAL FUNCTION PANEL
Albumin: 1.8 g/dL — ABNORMAL LOW (ref 3.5–5.0)
Anion gap: 12 (ref 5–15)
BUN: 98 mg/dL — ABNORMAL HIGH (ref 6–20)
CO2: 16 mmol/L — ABNORMAL LOW (ref 22–32)
Calcium: 9 mg/dL (ref 8.9–10.3)
Chloride: 114 mmol/L — ABNORMAL HIGH (ref 98–111)
Creatinine, Ser: 5.29 mg/dL — ABNORMAL HIGH (ref 0.44–1.00)
GFR, Estimated: 9 mL/min — ABNORMAL LOW (ref >60)
Glucose, Bld: 162 mg/dL — ABNORMAL HIGH (ref 70–99)
Phosphorus: 8.4 mg/dL — ABNORMAL HIGH (ref 2.5–4.6)
Potassium: 5.6 mmol/L — ABNORMAL HIGH (ref 3.5–5.1)
Sodium: 142 mmol/L (ref 135–145)

## 2022-03-25 LAB — GLUCOSE, CAPILLARY
Glucose-Capillary: 132 mg/dL — ABNORMAL HIGH (ref 70–99)
Glucose-Capillary: 145 mg/dL — ABNORMAL HIGH (ref 70–99)
Glucose-Capillary: 151 mg/dL — ABNORMAL HIGH (ref 70–99)
Glucose-Capillary: 158 mg/dL — ABNORMAL HIGH (ref 70–99)
Glucose-Capillary: 163 mg/dL — ABNORMAL HIGH (ref 70–99)
Glucose-Capillary: 172 mg/dL — ABNORMAL HIGH (ref 70–99)

## 2022-03-25 LAB — HEPATITIS B SURFACE ANTIBODY, QUANTITATIVE: Hep B S AB Quant (Post): 7.1 m[IU]/mL — ABNORMAL LOW (ref >9.9)

## 2022-03-25 LAB — MAGNESIUM: Magnesium: 2.9 mg/dL — ABNORMAL HIGH (ref 1.7–2.4)

## 2022-03-25 MED ORDER — HEPARIN SODIUM (PORCINE) 1000 UNIT/ML IJ SOLN
INTRAMUSCULAR | Status: AC
  Administered 2022-03-25: 1000 [IU]
  Filled 2022-03-25: qty 8

## 2022-03-25 MED ORDER — LABETALOL HCL 5 MG/ML IV SOLN
10.0000 mg | INTRAVENOUS | Status: AC | PRN
  Administered 2022-03-26 (×2): 10 mg via INTRAVENOUS
  Filled 2022-03-25 (×2): qty 4

## 2022-03-25 MED ORDER — LABETALOL HCL 5 MG/ML IV SOLN
INTRAVENOUS | Status: AC
  Administered 2022-03-25: 20 mg via INTRAVENOUS
  Filled 2022-03-25: qty 4

## 2022-03-25 MED ORDER — AMLODIPINE BESYLATE 5 MG PO TABS
5.0000 mg | ORAL_TABLET | Freq: Every day | ORAL | Status: DC
  Administered 2022-03-25: 5 mg
  Filled 2022-03-25: qty 1

## 2022-03-25 MED ORDER — BANATROL TF EN LIQD
60.0000 mL | Freq: Two times a day (BID) | ENTERAL | Status: DC
  Administered 2022-03-25 – 2022-03-30 (×10): 60 mL
  Filled 2022-03-25 (×12): qty 60

## 2022-03-25 NOTE — Progress Notes (Signed)
NAME:  Darlene Barber, MRN:  [id number], DOB:  07/26/1965, LOS: 54 ADMISSION DATE:  03/10/2022, CONSULTATION DATE:  12/30  REFERRING MD:  Dr. Cinda Quest, CHIEF COMPLAINT:  Abdominal Pain    History of Present Illness:  [name] who presented to the [facility] ER on 12/31 with reports of abdominal pain and widespread rash.  On admission, the patient significant other reported that the day after eating at a local restaurant in [place], [place] she developed abdominal pain and rash.  She was reportedly up all night with crampy abdominal pain associated with nausea and vomiting.  Around 2 PM on the day of admission she noticed a rash all over her body and had worsening pain prompting him to call EMS.  She was evaluated in the emergency room and was found to have oxygen saturations of 87% on 2 L, afebrile with an elevated white blood cell count of 29.2.  Additional labs notable for acute kidney injury, anion gap metabolic acidosis and LFTs with mixed hepatic and cholestatic picture.  Stool samples were sent with workup of acute diarrhea.  Stool pathogen panel was negative.  C. difficile toxin was negative.  She was treated with 30 mL/kg IV fluid and started on empiric broad-spectrum antibiotics with vancomycin, cefepime and Flagyl.  She remained hypotensive despite IV fluid boluses and was started on vasopressors.  She was transferred to [facility] as there were no inpatient beds at [facility].    PCCM consulted for ICU admission.  Pertinent  Medical History  Chronic Pain Syndrome  Cervical Spondylitis  GERD  Bipolar Disorder  Tobacco Abuse - daily  THC Abuse - daily   Significant Hospital Events: Including procedures, antibiotic start and stop dates in addition to other pertinent events   12/31 admit with abdominal pain, diarrhea.  Left IJ TLC placement.  CT chest abdomen pelvis with fluid throughout the stomach, small bowel and colon, nonspecific but may represent diarrheal state.  No bowel obstruction,  definite bowel wall thickening or pneumoperitoneum.  Fluid within the esophagus may represent reflux or nonspecific esophageal motility disorder.  Trace amount of free pelvic fluid, nonspecific.  UDS positive for tricyclics, THC and amphetamines.  C. difficile negative, GI PCR negative.  COVID, flu, RSV negative.  Antibiotics narrowed to Zosyn. BC positives for strep infantarius.  1/3 CRRT, PEEP 5/60% 1/4 CRRT with negative balance. Precedex added to Fentanyl.  1/5 CT chest abd pelvis, TEE done with no evidence of vegetations 1/8 Taken off CRRT, MIVF started 1/9 too wild on precedex, severe HTN prevented extubation 1/10 restarted on CRRT 1/11 extubated, transferred from [facility] for iHD 1/13 Clonazepam 0.5 twice daily, Seroquel to twice daily 1/14 Precedex weaned to off this morning  Interim History / Subjective:   Off Precedex since yesterday, hypertensive  Objective   Blood pressure (!) 177/91, pulse (!) 101, temperature 100 F (37.8 C), resp. rate (!) 25, height 4\' 10"  (1.473 m), weight 77 kg, SpO2 98 %.    FiO2 (%):  [32 %] 32 %   Intake/Output Summary (Last 24 hours) at 03/25/2022 0727 Last data filed at 03/25/2022 0600 Gross per 24 hour  Intake 2092.01 ml  Output 1805 ml  Net 287.01 ml   Filed Weights   03/21/22 0500 03/22/22 0300 03/24/22 0500  Weight: 80.4 kg 76.6 kg 77 kg    Examination: Blood pressure (!) 177/91, pulse (!) 101, temperature 100 F (37.8 C), resp. rate (!) 25, height 4\' 10"  (1.473 m), weight 77 kg,  SpO2 98 %. Gen:      No acute distress HEENT:  EOMI, sclera anicteric Neck:     No masses; no thyromegaly Lungs:    Clear to auscultation bilaterally; normal respiratory effort CV:         Regular rate and rhythm; no murmurs Abd:      + bowel sounds; soft, non-tender; no palpable masses, no distension Ext:    No edema; adequate peripheral perfusion Skin:      Warm and dry; no rash Neuro:  Sedated  Labs/imaging reviewed Significant for potassium 5.6,  BUN/creatinine 98/5.29 CBC 16.3, platelets 504 Chest x-ray with bilateral patchy airspace disease  Resolved Hospital Problem list   Hypotension - sepsis + sedation  Transaminitis Shock Liver Rash, Hives TCP related to CRRT, sepsis Multifocal pneumonia, possible VAP, completed course 1/11 Possible melena  Assessment & Plan:  Sepsis in setting of Streptococcus Infantarius Bacteremia  Repeat cultures om 1/3 negative.  TEE negative CT abdomen pelvis with no clear evidence of malignancy.  Will need colonoscopy as an outpatient can be associated with colon cancer Continue ceftriaxone through 1/17  Hypoxemic Respiratory Failure  Possible aspiration pneumonia.  Wean down high flow nasal cannula as needed Continue Brovana, Pulmicort Monitor for airway protection NT suctioning as needed  Acute Metabolic / Toxic Encephalopathy  Remains confused, awakens to voice and tracking but not following commands or speaking. Likely related to sedation in setting of poor renal function. Off Precedex Continue clonazepam, Seroquel.  Monitor mental status  Normocytic anemia Follow CBC  Renal Failure, likely ATN   Improving urine output but SCr rising Continue gentle IV fluids Nephrology is on board.  May require intermittent HD  UDS Positive: Amphetamines, Cannabis, TCA's  Cessation counseling when able   Bipolar Disorder Chronic Pain  Continue home vilazodone Continue Seroquel to 50 mg twice daily to help with agitation (she was on 300 mg nightly at home).  May decide to uptitrate  Hyperglycemia Sliding scale insulin  HTN Started Norvasc 5 mg.  Labetelol PRN  At Risk Malnutrition  Continue tube feeding until she can pass swallowing evaluation  Best Practice (right click and "Reselect all SmartList Selections" daily)  Diet/type: tubefeeds  DVT prophylaxis: prophylactic heparin  GI prophylaxis: PPI Lines: Central line and Dialysis Catheter (PICC 1/10-, R fem HD cath 1/2-)   Foley:   Yes, and it is still needed Code Status:  full code Last date of multidisciplinary goals of care discussion: Discussed with son POA Citlalic Norlander 03/19/22. We would reintubate if fails extubation with plan for tracheostomy.  [name] boyfriend updated daily  The patient is critically ill with multiple organ system failure and requires high complexity decision making for assessment and support, frequent evaluation and titration of therapies, advanced monitoring, review of radiographic studies and interpretation of complex data.   Critical Care Time devoted to patient care services, exclusive of separately billable procedures, described in this note is 35 minutes.   Marshell Garfinkel MD [facility] See Amion for pager  If no response to pager , please call [phone] until 7pm After 7:00 pm call Elink  [phone] 03/25/2022, 7:28 AM

## 2022-03-25 NOTE — Progress Notes (Signed)
Patient ID: [name], female   DOB: 01-01-1966, 57 y.o.   MRN: [id number] S: UOP improving.  Currently on HD and tolerating it well. O:BP (!) 163/93   Pulse (!) 104   Temp (!) 100.4 F (38 C)   Resp (!) 28   Ht 4\' 10"  (1.473 m)   Wt 77 kg   SpO2 96%   BMI 35.48 kg/m   Intake/Output Summary (Last 24 hours) at 03/25/2022 1007 Last data filed at 03/25/2022 0800 Gross per 24 hour  Intake 2091.86 ml  Output 1555 ml  Net 536.86 ml   Intake/Output: I/O last 3 completed shifts: In: 3347.7 [I.V.:1657.7; NG/GT:1590; IV Piggyback:100] Out: 2930 [Urine:2280; Stool:650]  Intake/Output this shift:  Total I/O In: 85 [I.V.:45; NG/GT:40] Out: 330 [Urine:330] Weight change:  Gen: unresponsive and nonverbal CVS: tachy Resp: scattered rhonchi Abd: +BS, soft, NT/Nd Ext: no edema  Recent Labs  Lab 03/20/22 0350 03/20/22 1600 03/21/22 0422 03/21/22 1730 03/22/22 0101 03/22/22 0801 03/23/22 0349 03/24/22 0549 03/25/22 0411  NA 137 138 134* 137 136 135 139 142 142  K 5.6* 5.5* 5.0 5.0 5.1 4.7 4.9 5.5* 5.6*  CL 104 103 102 105  --  105 108 112* 114*  CO2 20* 23 23 23   --  21* 18* 18* 16*  GLUCOSE 206* 288* 191* 235*  --  139* 179* 192* 162*  BUN 60* 55* 38* 37*  --  44* 64* 82* 98*  CREATININE 4.39* 3.24* 2.23* 1.79*  --  2.74* 3.91* 4.96* 5.29*  ALBUMIN 1.7* 1.9* 1.8* 1.9*  --  1.6*  --  1.8* 1.8*  CALCIUM 9.3 8.7* 7.9* 8.0*  --  8.3* 8.5* 8.9 9.0  PHOS 7.6* 5.3* 4.5 3.2  --  4.4  --  7.9* 8.4*   Liver Function Tests: Recent Labs  Lab 03/22/22 0801 03/24/22 0549 03/25/22 0411  ALBUMIN 1.6* 1.8* 1.8*   No results for input(s): "LIPASE", "AMYLASE" in the last 168 hours. No results for input(s): "AMMONIA" in the last 168 hours. CBC: Recent Labs  Lab 03/21/22 0422 03/22/22 0101 03/22/22 0730 03/22/22 1550 03/23/22 0349 03/24/22 0549  WBC 14.0*  --  16.7* 18.2* 16.6* 16.3*  HGB 7.4*   < > 6.5* 8.3* 7.8* 8.3*  HCT 23.1*   < > 19.0* 24.1* 23.2* 24.5*  MCV 103.6*   --  96.9 93.4 93.5 93.5  PLT 295  --  357 399 435* 504*   < > = values in this interval not displayed.   Cardiac Enzymes: No results for input(s): "CKTOTAL", "CKMB", "CKMBINDEX", "TROPONINI" in the last 168 hours. CBG: Recent Labs  Lab 03/24/22 1614 03/24/22 1959 03/24/22 2346 03/25/22 0348 03/25/22 0834  GLUCAP 189* 184* 160* 158* 163*    Iron Studies: No results for input(s): "IRON", "TIBC", "TRANSFERRIN", "FERRITIN" in the last 72 hours. Studies/Results: DG Chest Port 1 View  Result Date: 03/25/2022 CLINICAL DATA:  Respiratory failure. EXAM: PORTABLE CHEST 1 VIEW COMPARISON:  03/22/2022 FINDINGS: Nasogastric tube has been removed. Feeding tube has been placed, tip beyond the edge of the film at least to the level of the mid stomach. RIGHT-sided PICC line tip overlies the superior vena. Patchy opacities throughout the lungs similar to prior study. IMPRESSION: Feeding tube tip beyond the edge of the film at least to the level of the mid stomach. Electronically Signed   By: Nolon Nations M.D.   On: 03/25/2022 07:30    amLODipine  5 mg Per Tube Daily   arformoterol  15 mcg Nebulization BID   budesonide (PULMICORT) nebulizer solution  0.5 mg Nebulization BID   Chlorhexidine Gluconate Cloth  6 each Topical Daily   darbepoetin (ARANESP) injection - DIALYSIS  40 mcg Subcutaneous Q Fri-1800   docusate  100 mg Per Tube BID   feeding supplement (PROSource TF20)  60 mL Per Tube Daily   heparin injection (subcutaneous)  5,000 Units Subcutaneous Q8H   insulin aspart  0-15 Units Subcutaneous Q4H   insulin aspart  3 Units Subcutaneous Q4H   mouth rinse  15 mL Mouth Rinse 4 times per day   pantoprazole (PROTONIX) IV  40 mg Intravenous Daily   polyethylene glycol  17 g Per Tube Daily   QUEtiapine  50 mg Per Tube BID   revefenacin  175 mcg Nebulization Daily   sodium chloride flush  10-40 mL Intracatheter Q12H   Vilazodone HCl  40 mg Per Tube Daily    BMET    Component Value Date/Time    NA 142 03/25/2022 0411   NA 137 04/20/2012 1725   K 5.6 (H) 03/25/2022 0411   K 4.2 04/20/2012 1725   CL 114 (H) 03/25/2022 0411   CL 106 04/20/2012 1725   CO2 16 (L) 03/25/2022 0411   CO2 24 04/20/2012 1725   GLUCOSE 162 (H) 03/25/2022 0411   GLUCOSE 97 04/20/2012 1725   BUN 98 (H) 03/25/2022 0411   BUN 14 04/20/2012 1725   CREATININE 5.29 (H) 03/25/2022 0411   CREATININE 0.83 09/30/2019 1057   CALCIUM 9.0 03/25/2022 0411   CALCIUM 9.2 04/20/2012 1725   GFRNONAA 9 (L) 03/25/2022 0411   GFRNONAA 81 09/30/2019 1057   GFRAA 93 09/30/2019 1057   CBC    Component Value Date/Time   WBC 16.3 (H) 03/24/2022 0549   RBC 2.62 (L) 03/24/2022 0549   HGB 8.3 (L) 03/24/2022 0549   HGB 16.7 (H) 04/20/2012 1725   HCT 24.5 (L) 03/24/2022 0549   HCT 50.0 (H) 04/20/2012 1725   PLT 504 (H) 03/24/2022 0549   PLT 331 04/20/2012 1725   MCV 93.5 03/24/2022 0549   MCV 98 04/20/2012 1725   MCH 31.7 03/24/2022 0549   MCHC 33.9 03/24/2022 0549   RDW 17.2 (H) 03/24/2022 0549   RDW 14.2 04/20/2012 1725   LYMPHSABS 1.4 03/09/2022 1350   LYMPHSABS 2.7 08/27/2011 0558   MONOABS 2.0 (H) 03/09/2022 1350   MONOABS 0.5 08/27/2011 0558   EOSABS 0.2 03/09/2022 1350   EOSABS 0.5 08/27/2011 0558   BASOSABS 0.1 03/09/2022 1350   BASOSABS 0.1 08/27/2011 0558   Assessment/ Plan: AKI - crt of 0.8 in August of 2023, bland UA in October of 23.  Now with AKI , proteinuria and hematuria but also pyuria.  Seems most likely to be AKI from ATN in the setting of bacteremia. Given the proteinuria and hematuria concerning for GN, some serologies were sent and were generally negative (see above). 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 SP CRRT 1/02- 1/11. Tx'd to [facility] 1/11. UOP was low and wts were dropping down, so cautious IVF's started for suspected vol depletion. UOP improving now w/ 1555 cc yesterday. Creat rising at 5.29 today w/ BUN 98, K+ 5.6 today. ATN not  recovering yet and currently receiving HD.  UOP improving so hopefully seeing the start of recovery. Hyperkalemia - lokelma x 2 and switching TF's to Nepro Volume - persistent infiltrates on CXR are likely infection-related.  HTN -  prn IV labetalol. BP's borderline high, not on any scheduled BP lowering meds yet.  Normocytic Anemia  - deferring IV iron in setting of infection.  Aranesp 40 mcg every Thursday  Strep bacteremia-  streptococcus infantarius, TEE negative, getting Rocephin thru 1/17. Colonoscopy when more stable per pmd.  Acute hypoxic respiratory failure - extubated on 1/10, on high level [place] O2 now. Repeat CXR's show persistent bilat infiltrates as above H/o polysubstance abuse  Donetta Potts, MD [facility]

## 2022-03-25 NOTE — Progress Notes (Signed)
RT held CPT at this time due to pt looking comfortable and sleeping.

## 2022-03-25 NOTE — Progress Notes (Signed)
Initial Nutrition Assessment  DOCUMENTATION CODES:   Obesity unspecified  INTERVENTION:  - Continue Nepro @ 40 mL/hr.  - This will provide 1700 kcals, 77 gm protein, 697 mL free water.   - Discontinue [id number] Prosource.   - Add Banatrol BID.   NUTRITION DIAGNOSIS:   Inadequate oral intake related to inability to eat as evidenced by NPO status (on vent). - Progressing, now on HFNC.   GOAL:   Patient will meet greater than or equal to 90% of their needs  MONITOR:   Vent status, TF tolerance, Labs, Weight trends  REASON FOR ASSESSMENT:   Consult Enteral/tube feeding initiation and management (trickle TF with recs)  ASSESSMENT:   57 yo female w/ pertinent PMH HTN, T2DM, OSA, urge incontinence, bipolar depression presents to ED on 12/30 w/ rash and sob. Found to have sepsis and acute hypoxemic respiratory failure.  Meds reviewed: colace, sliding scale insulin, miralax. Labs reviewed: K high, phos high.   Pt is now off of the vent and on HFNC. Cortrak remains in place. Pt is disoriented x4.  TF was also changed to Nepro @ 40 mL/hr due to elevated K and phosphorus levels. RN reports that the pt is tolerating TF well but is having diarrhea. RD will add Banatrol BID. Pt is meeting her needs with current TF regimen.   Diet Order:   Diet Order             Diet NPO time specified  Diet effective now                   EDUCATION NEEDS:   Not appropriate for education at this time  Skin:  Skin Assessment: Reviewed RN Assessment Skin Integrity Issues:: Other (Comment) Other: skin tears L knee  Last BM:  1/15  Height:   Ht Readings from Last 1 Encounters:  03/12/22 4\' 10"  (1.473 m)    Weight:   Wt Readings from Last 1 Encounters:  03/25/22 77 kg    Ideal Body Weight:  43.2 kg  BMI:  Body mass index is 35.48 kg/m.  Estimated Nutritional Needs:   Kcal:  1500-1700 kcals  Protein:  80-90 grams  Fluid:  >/= 1.5L  Thalia Bloodgood, RD, LDN, CNSC.

## 2022-03-25 NOTE — Progress Notes (Signed)
Received patient in bed to unit.  Alert and oriented.  Informed consent signed and in chart.   Treatment initiated: 0900 Treatment completed: 1230  Patient tolerated well.   without acute distress.  Hand-off given to patient's nurse.   Access used: catheter Access issues: none  Total UF removed: 0 L Medication(s) given: none Post HD VS: 149/95 p 99 R 21 Post HD weight: 77 kg   [facility]

## 2022-03-25 NOTE — Progress Notes (Signed)
[place] Progress Note Patient Name: TYFFANY WALDROP DOB: 26-Mar-1965 MRN: [id number]   Date of Service  03/25/2022  HPI/Events of Note  Patient continues to be hypertensive 178/95 (117) even after geting PRN Labetalol given 2 hours prior, asking if parameter can be changed or order another antiHTN Seen asleep, not in distress nor agitated  eICU Interventions  Patient takes amlodipine 10 mg, due for dialysis today. Will start amlodipine at 5 mg Due dose labetalol prn to be given Discussed with bedside RN     Intervention Category Intermediate Interventions: Hypertension - evaluation and management  Shona Needles Charlett Merkle 03/25/2022, 2:01 AM

## 2022-03-26 DIAGNOSIS — A409 Streptococcal sepsis, unspecified: Secondary | ICD-10-CM | POA: Diagnosis not present

## 2022-03-26 DIAGNOSIS — J189 Pneumonia, unspecified organism: Secondary | ICD-10-CM | POA: Diagnosis not present

## 2022-03-26 DIAGNOSIS — Z1152 Encounter for screening for COVID-19: Secondary | ICD-10-CM | POA: Diagnosis not present

## 2022-03-26 DIAGNOSIS — G928 Other toxic encephalopathy: Secondary | ICD-10-CM | POA: Diagnosis not present

## 2022-03-26 DIAGNOSIS — R6521 Severe sepsis with septic shock: Secondary | ICD-10-CM | POA: Diagnosis not present

## 2022-03-26 DIAGNOSIS — A419 Sepsis, unspecified organism: Secondary | ICD-10-CM | POA: Diagnosis not present

## 2022-03-26 LAB — CBC
HCT: 23.4 % — ABNORMAL LOW (ref 36.0–46.0)
Hemoglobin: 8 g/dL — ABNORMAL LOW (ref 12.0–15.0)
MCH: 31.9 pg (ref 26.0–34.0)
MCHC: 34.2 g/dL (ref 30.0–36.0)
MCV: 93.2 fL (ref 80.0–100.0)
Platelets: 494 10*3/uL — ABNORMAL HIGH (ref 150–400)
RBC: 2.51 MIL/uL — ABNORMAL LOW (ref 3.87–5.11)
RDW: 16.4 % — ABNORMAL HIGH (ref 11.5–15.5)
WBC: 16.3 10*3/uL — ABNORMAL HIGH (ref 4.0–10.5)
nRBC: 0.2 % (ref 0.0–0.2)

## 2022-03-26 LAB — RENAL FUNCTION PANEL
Albumin: 1.9 g/dL — ABNORMAL LOW (ref 3.5–5.0)
Anion gap: 12 (ref 5–15)
BUN: 62 mg/dL — ABNORMAL HIGH (ref 6–20)
CO2: 22 mmol/L (ref 22–32)
Calcium: 8.3 mg/dL — ABNORMAL LOW (ref 8.9–10.3)
Chloride: 106 mmol/L (ref 98–111)
Creatinine, Ser: 3.58 mg/dL — ABNORMAL HIGH (ref 0.44–1.00)
GFR, Estimated: 14 mL/min — ABNORMAL LOW (ref >60)
Glucose, Bld: 159 mg/dL — ABNORMAL HIGH (ref 70–99)
Phosphorus: 6.3 mg/dL — ABNORMAL HIGH (ref 2.5–4.6)
Potassium: 4.3 mmol/L (ref 3.5–5.1)
Sodium: 140 mmol/L (ref 135–145)

## 2022-03-26 LAB — GLUCOSE, CAPILLARY
Glucose-Capillary: 162 mg/dL — ABNORMAL HIGH (ref 70–99)
Glucose-Capillary: 160 mg/dL — ABNORMAL HIGH (ref 70–99)
Glucose-Capillary: 132 mg/dL — ABNORMAL HIGH (ref 70–99)
Glucose-Capillary: 158 mg/dL — ABNORMAL HIGH (ref 70–99)
Glucose-Capillary: 136 mg/dL — ABNORMAL HIGH (ref 70–99)
Glucose-Capillary: 135 mg/dL — ABNORMAL HIGH (ref 70–99)

## 2022-03-26 LAB — HEPATIC FUNCTION PANEL
ALT: 27 U/L (ref 0–44)
AST: 24 U/L (ref 15–41)
Albumin: 2 g/dL — ABNORMAL LOW (ref 3.5–5.0)
Alkaline Phosphatase: 94 U/L (ref 38–126)
Bilirubin, Direct: <0.1 mg/dL (ref 0.0–0.2)
Total Bilirubin: 0.4 mg/dL (ref 0.3–1.2)
Total Protein: 5.6 g/dL — ABNORMAL LOW (ref 6.5–8.1)

## 2022-03-26 MED ORDER — AMLODIPINE BESYLATE 10 MG PO TABS
10.0000 mg | ORAL_TABLET | Freq: Every day | ORAL | Status: DC
  Administered 2022-03-26 – 2022-03-28 (×3): 10 mg
  Filled 2022-03-26 (×3): qty 1

## 2022-03-26 MED ORDER — ACETAMINOPHEN 325 MG PO TABS: 325.0000 mg | ORAL_TABLET | Freq: Once | ORAL | Status: DC | PRN

## 2022-03-26 NOTE — Evaluation (Signed)
Clinical/Bedside Swallow Evaluation Patient Details  Name: RYLAND SMOOTS MRN: [id number] Date of Birth: 1965-07-02  Today's Date: 03/26/2022 Time: SLP Start Time (ACUTE ONLY): 1406 SLP Stop Time (ACUTE ONLY): 1428 SLP Time Calculation (min) (ACUTE ONLY): 22 min  Past Medical History:  Past Medical History:  Diagnosis Date   Allergy    Anxiety    Bursitis of both hips    Depression    Frequent headaches    Obesity    Sleep apnea    doesn't use CPAP machine broken,    Past Surgical History:  Past Surgical History:  Procedure Laterality Date   CESAREAN SECTION     x2   CHOLECYSTECTOMY     COLONOSCOPY WITH PROPOFOL N/A 11/20/2017   Procedure: COLONOSCOPY WITH PROPOFOL;  Surgeon: Lin Landsman, MD;  Location: [facility] ENDOSCOPY;  Service: Gastroenterology;  Laterality: N/A;   HYSTERECTOMY ABDOMINAL WITH SALPINGECTOMY  1998   HYSTEROSCOPY     HPI:  Pt is a 57 year old female admitted with  abdominal pain, diarrhea.  Found to have Streptococcus Infantarius Bacteremia. Ultimately needed intubation, CRRT, pressors.  CT chest abdomen pelvis with fluid throughout the stomach, small bowel and colon, nonspecific but may represent diarrheal state.  No bowel obstruction, definite bowel wall thickening or pneumoperitoneum.  Fluid within the esophagus may represent reflux or nonspecific esophageal motility disorder. Intubated from 12/31 from 1/11, precedex weaned on 1/14.    Assessment / Plan / Recommendation  Clinical Impression  Pt demonstrates no signs of aspiration with consecutive straw sips of thin liquids. She has a history of significant night time reflux. Boyfriend reports she often regurgitates at night. He tries to get her to sit up on the couch but she lays down and regurgitates and coughs. Pt has dentures but they are lost in her bedroom and she hasnt work them in some time. She is currently confused and needs assistance with feeding. Will initiate a full liquid diet and f/u to  advance depending on mentation. SLP Visit Diagnosis: Dysphagia, oropharyngeal phase (R13.12)    Aspiration Risk  Moderate aspiration risk    Diet Recommendation Thin liquid   Liquid Administration via: Cup;Straw Medication Administration: Whole meds with liquid Supervision: Full supervision/cueing for compensatory strategies Compensations: Slow rate;Small sips/bites    Other  Recommendations Recommended Consults: Consider esophageal assessment;Consider GI evaluation Oral Care Recommendations: Oral care BID    Recommendations for follow up therapy are one component of a multi-disciplinary discharge planning process, led by the attending physician.  Recommendations may be updated based on patient status, additional functional criteria and insurance authorization.  Follow up Recommendations No SLP follow up      Assistance Recommended at Discharge    Functional Status Assessment    Frequency and Duration min 2x/week  2 weeks       Prognosis        Swallow Study   General HPI: Pt is a 57 year old female admitted with  abdominal pain, diarrhea.  Found to have Streptococcus Infantarius Bacteremia. Ultimately needed intubation, CRRT, pressors.  CT chest abdomen pelvis with fluid throughout the stomach, small bowel and colon, nonspecific but may represent diarrheal state.  No bowel obstruction, definite bowel wall thickening or pneumoperitoneum.  Fluid within the esophagus may represent reflux or nonspecific esophageal motility disorder. Intubated from 12/31 from 1/11, precedex weaned on 1/14. Type of Study: Bedside Swallow Evaluation Diet Prior to this Study: NPO Temperature Spikes Noted: No Respiratory Status: Nasal cannula History of Recent Intubation:  Yes Date extubated: 03/24/22 Behavior/Cognition: Alert;Uncooperative;Requires cueing Oral Care Completed by SLP: No Oral Cavity - Dentition: Edentulous Self-Feeding Abilities: Total assist;Needs assist Patient Positioning:  Upright in bed Baseline Vocal Quality: Hoarse Volitional Cough: Cognitively unable to elicit Volitional Swallow: Unable to elicit    Oral/Motor/Sensory Function Overall Oral Motor/Sensory Function: Within functional limits   Ice Chips Ice chips: Within functional limits   Thin Liquid Thin Liquid: Within functional limits Presentation: Cup;Straw    Nectar Thick Nectar Thick Liquid: Not tested   Honey Thick Honey Thick Liquid: Not tested   Puree Puree: Within functional limits   Solid     Solid: Not tested     Herbie Baltimore, MA CCC-SLP  Acute Rehabilitation Services Secure Chat Preferred Office [phone]  Lynann Beaver 03/26/2022,2:28 PM

## 2022-03-26 NOTE — Progress Notes (Signed)
Patient ID: Darlene Barber, female   DOB: May 24, 1965, 57 y.o.   MRN: [id number] S:No events overnight O:BP (!) 166/87   Pulse (!) 113   Temp 99.1 F (37.3 C)   Resp (!) 22   Ht 4\' 10"  (1.473 m)   Wt 77 kg   SpO2 99%   BMI 35.48 kg/m   Intake/Output Summary (Last 24 hours) at 03/26/2022 0901 Last data filed at 03/26/2022 0800 Gross per 24 hour  Intake 1894.25 ml  Output 1115 ml  Net 779.25 ml   Intake/Output: I/O last 3 completed shifts: In: 2996.3 [I.V.:1556.1; NG/GT:1440; IV Piggyback:0.2] Out: 2245 [Urine:1645; Stool:600]  Intake/Output this shift:  Total I/O In: 84 [I.V.:44; NG/GT:40] Out: -  Weight change:  Gen: slowed mentation, NAD CVS: tachy at 113 Resp:CTA Abd: +BS, soft, NT/ND Ext: no edema  Recent Labs  Lab 03/20/22 1600 03/21/22 0422 03/21/22 1730 03/22/22 0101 03/22/22 0801 03/23/22 0349 03/24/22 0549 03/25/22 0411 03/26/22 0438 03/26/22 0734  NA 138 134* 137 136 135 139 142 142 140  --   K 5.5* 5.0 5.0 5.1 4.7 4.9 5.5* 5.6* 4.3  --   CL 103 102 105  --  105 108 112* 114* 106  --   CO2 23 23 23   --  21* 18* 18* 16* 22  --   GLUCOSE 288* 191* 235*  --  139* 179* 192* 162* 159*  --   BUN 55* 38* 37*  --  44* 64* 82* 98* 62*  --   CREATININE 3.24* 2.23* 1.79*  --  2.74* 3.91* 4.96* 5.29* 3.58*  --   ALBUMIN 1.9* 1.8* 1.9*  --  1.6*  --  1.8* 1.8* 1.9* 2.0*  CALCIUM 8.7* 7.9* 8.0*  --  8.3* 8.5* 8.9 9.0 8.3*  --   PHOS 5.3* 4.5 3.2  --  4.4  --  7.9* 8.4* 6.3*  --   AST  --   --   --   --   --   --   --   --   --  24  ALT  --   --   --   --   --   --   --   --   --  27   Liver Function Tests: Recent Labs  Lab 03/25/22 0411 03/26/22 0438 03/26/22 0734  AST  --   --  24  ALT  --   --  27  ALKPHOS  --   --  94  BILITOT  --   --  0.4  PROT  --   --  5.6*  ALBUMIN 1.8* 1.9* 2.0*   No results for input(s): "LIPASE", "AMYLASE" in the last 168 hours. No results for input(s): "AMMONIA" in the last 168 hours. CBC: Recent Labs  Lab  03/22/22 0730 03/22/22 1550 03/23/22 0349 03/24/22 0549 03/26/22 0438  WBC 16.7* 18.2* 16.6* 16.3* 16.3*  HGB 6.5* 8.3* 7.8* 8.3* 8.0*  HCT 19.0* 24.1* 23.2* 24.5* 23.4*  MCV 96.9 93.4 93.5 93.5 93.2  PLT 357 399 435* 504* 494*   Cardiac Enzymes: No results for input(s): "CKTOTAL", "CKMB", "CKMBINDEX", "TROPONINI" in the last 168 hours. CBG: Recent Labs  Lab 03/25/22 1609 03/25/22 2004 03/25/22 2347 03/26/22 0355 03/26/22 0747  GLUCAP 151* 145* 172* 162* 160*    Iron Studies: No results for input(s): "IRON", "TIBC", "TRANSFERRIN", "FERRITIN" in the last 72 hours. Studies/Results: DG Chest Port 1 View  Result Date: 03/25/2022 CLINICAL DATA:  Respiratory failure. EXAM: PORTABLE CHEST  1 VIEW COMPARISON:  03/22/2022 FINDINGS: Nasogastric tube has been removed. Feeding tube has been placed, tip beyond the edge of the film at least to the level of the mid stomach. RIGHT-sided PICC line tip overlies the superior vena. Patchy opacities throughout the lungs similar to prior study. IMPRESSION: Feeding tube tip beyond the edge of the film at least to the level of the mid stomach. Electronically Signed   By: Nolon Nations M.D.   On: 03/25/2022 07:30    amLODipine  10 mg Per Tube Daily   arformoterol  15 mcg Nebulization BID   budesonide (PULMICORT) nebulizer solution  0.5 mg Nebulization BID   Chlorhexidine Gluconate Cloth  6 each Topical Daily   darbepoetin (ARANESP) injection - DIALYSIS  40 mcg Subcutaneous Q Fri-1800   fiber supplement (BANATROL TF)  60 mL Per Tube BID   heparin injection (subcutaneous)  5,000 Units Subcutaneous Q8H   insulin aspart  0-15 Units Subcutaneous Q4H   insulin aspart  3 Units Subcutaneous Q4H   mouth rinse  15 mL Mouth Rinse 4 times per day   pantoprazole (PROTONIX) IV  40 mg Intravenous Daily   QUEtiapine  50 mg Per Tube BID   revefenacin  175 mcg Nebulization Daily   sodium chloride flush  10-40 mL Intracatheter Q12H   Vilazodone HCl  40 mg Per Tube  Daily    BMET    Component Value Date/Time   NA 140 03/26/2022 0438   NA 137 04/20/2012 1725   K 4.3 03/26/2022 0438   K 4.2 04/20/2012 1725   CL 106 03/26/2022 0438   CL 106 04/20/2012 1725   CO2 22 03/26/2022 0438   CO2 24 04/20/2012 1725   GLUCOSE 159 (H) 03/26/2022 0438   GLUCOSE 97 04/20/2012 1725   BUN 62 (H) 03/26/2022 0438   BUN 14 04/20/2012 1725   CREATININE 3.58 (H) 03/26/2022 0438   CREATININE 0.83 09/30/2019 1057   CALCIUM 8.3 (L) 03/26/2022 0438   CALCIUM 9.2 04/20/2012 1725   GFRNONAA 14 (L) 03/26/2022 0438   GFRNONAA 81 09/30/2019 1057   GFRAA 93 09/30/2019 1057   CBC    Component Value Date/Time   WBC 16.3 (H) 03/26/2022 0438   RBC 2.51 (L) 03/26/2022 0438   HGB 8.0 (L) 03/26/2022 0438   HGB 16.7 (H) 04/20/2012 1725   HCT 23.4 (L) 03/26/2022 0438   HCT 50.0 (H) 04/20/2012 1725   PLT 494 (H) 03/26/2022 0438   PLT 331 04/20/2012 1725   MCV 93.2 03/26/2022 0438   MCV 98 04/20/2012 1725   MCH 31.9 03/26/2022 0438   MCHC 34.2 03/26/2022 0438   RDW 16.4 (H) 03/26/2022 0438   RDW 14.2 04/20/2012 1725   LYMPHSABS 1.4 03/09/2022 1350   LYMPHSABS 2.7 08/27/2011 0558   MONOABS 2.0 (H) 03/09/2022 1350   MONOABS 0.5 08/27/2011 0558   EOSABS 0.2 03/09/2022 1350   EOSABS 0.5 08/27/2011 0558   BASOSABS 0.1 03/09/2022 1350   BASOSABS 0.1 08/27/2011 0558    Assessment/ Plan: AKI - crt of 0.8 in August of 2023, bland UA in October of 23.  Now with AKI , proteinuria and hematuria but also pyuria.  Seems most likely to be AKI from ATN in the setting of bacteremia. Given the proteinuria and hematuria concerning for GN, some serologies were sent and were generally negative (see above). 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 SP CRRT 1/02- 1/11. Tx'd to [facility]  1/11. UOP was low and wts were dropping down, so cautious IVF's started for suspected vol depletion. UOP improving now w/ 1555 cc but dropped down to 845 cc  over the past 24 hours.  Continue with IVF's.  Hopefully starting to see some renal recovery.  Will hold off on HD and remove femoral HD catheter as it has been in for 13 days.  Will follow response and decide if she will need a tunneled catheter and ongoing HD.   Hyperkalemia - lokelma x 2 and switching TF's to Nepro Volume - persistent infiltrates on CXR are likely infection-related.  HTN - prn IV labetalol. BP's borderline high, not on any scheduled BP lowering meds yet.  Normocytic Anemia  - deferring IV iron in setting of infection.  Aranesp 40 mcg every Thursday  Strep bacteremia-  streptococcus infantarius, TEE negative, getting Rocephin thru 1/17. Colonoscopy when more stable per pmd.  Acute hypoxic respiratory failure - extubated on 1/10, on high level [place] O2 now. Repeat CXR's show persistent bilat infiltrates as above H/o polysubstance abuse  Donetta Potts, MD [facility]

## 2022-03-26 NOTE — Progress Notes (Addendum)
NAME:  Darlene Barber, MRN:  [id number], DOB:  11/22/1965, LOS: 7 ADMISSION DATE:  03/10/2022, CONSULTATION DATE:  12/30  REFERRING MD:  Dr. Cinda Quest, CHIEF COMPLAINT:  Abdominal Pain    History of Present Illness:  [name] who presented to the [facility] ER on 12/31 with reports of abdominal pain and widespread rash.  On admission, the patient significant other reported that the day after eating at a local restaurant in [place], [place] she developed abdominal pain and rash.  She was reportedly up all night with crampy abdominal pain associated with nausea and vomiting.  Around 2 PM on the day of admission she noticed a rash all over her body and had worsening pain prompting him to call EMS.  She was evaluated in the emergency room and was found to have oxygen saturations of 87% on 2 L, afebrile with an elevated white blood cell count of 29.2.  Additional labs notable for acute kidney injury, anion gap metabolic acidosis and LFTs with mixed hepatic and cholestatic picture.  Stool samples were sent with workup of acute diarrhea.  Stool pathogen panel was negative.  C. difficile toxin was negative.  She was treated with 30 mL/kg IV fluid and started on empiric broad-spectrum antibiotics with vancomycin, cefepime and Flagyl.  She remained hypotensive despite IV fluid boluses and was started on vasopressors.  She was transferred to [facility] as there were no inpatient beds at [facility].    PCCM consulted for ICU admission.  Pertinent  Medical History  Chronic Pain Syndrome  Cervical Spondylitis  GERD  Bipolar Disorder  Tobacco Abuse - daily  THC Abuse - daily   Significant Hospital Events: Including procedures, antibiotic start and stop dates in addition to other pertinent events   12/31 admit with abdominal pain, diarrhea.  Left IJ TLC placement.  CT chest abdomen pelvis with fluid throughout the stomach, small bowel and colon, nonspecific but may represent diarrheal state.  No bowel obstruction,  definite bowel wall thickening or pneumoperitoneum.  Fluid within the esophagus may represent reflux or nonspecific esophageal motility disorder.  Trace amount of free pelvic fluid, nonspecific.  UDS positive for tricyclics, THC and amphetamines.  C. difficile negative, GI PCR negative.  COVID, flu, RSV negative.  Antibiotics narrowed to Zosyn. BC positives for strep infantarius.  1/3 CRRT, PEEP 5/60% 1/4 CRRT with negative balance. Precedex added to Fentanyl.  1/5 CT chest abd pelvis, TEE done with no evidence of vegetations 1/8 Taken off CRRT, MIVF started 1/9 too wild on precedex, severe HTN prevented extubation 1/10 restarted on CRRT 1/11 extubated, transferred from [facility] for iHD 1/13 Clonazepam 0.5 twice daily, Seroquel to twice daily 1/14 Precedex weaned to off this morning  Interim History / Subjective:   No acute events overnight.  Objective   Blood pressure (!) 160/97, pulse (!) 103, temperature 99.1 F (37.3 C), resp. rate (!) 22, height 4\' 10"  (1.473 m), weight 77 kg, SpO2 92 %.        Intake/Output Summary (Last 24 hours) at 03/26/2022 0817 Last data filed at 03/26/2022 0800 Gross per 24 hour  Intake 1979.3 ml  Output 1115 ml  Net 864.3 ml   Filed Weights   03/24/22 0500 03/25/22 0924 03/25/22 1255  Weight: 77 kg 77 kg 77 kg    Examination: Blood pressure (!) 166/87, pulse (!) 113, temperature 99.1 F (37.3 C), resp. rate (!) 22, height 4\' 10"  (1.473 m), weight 77 kg, SpO2 99 %. Gen:  No acute distress, chronically ill appearing HEENT:  EOMI, sclera anicteric Neck:     No masses; no thyromegaly Lungs:    Clear to auscultation bilaterally; normal respiratory effort CV:         Regular rate and rhythm; no murmurs Abd:      + bowel sounds; soft, non-tender; no palpable masses, no distension Ext:    No edema; adequate peripheral perfusion Skin:      Warm and dry; no rash Neuro: Awake, mild confusion  Labs/imaging reviewed Significant for glucose 159,  BUN/creatinine 62/3.58 AST 24, AST 27 Hemoglobin 8.0 No new imaging  Resolved Hospital Problem list   Hypotension - sepsis + sedation  Transaminitis Shock Liver Rash, Hives TCP related to CRRT, sepsis Multifocal pneumonia, possible VAP, completed course 1/11 Possible melena  Assessment & Plan:  Sepsis in setting of Streptococcus Infantarius Bacteremia  Repeat cultures om 1/3 negative.  TEE negative CT abdomen pelvis with no clear evidence of malignancy.  Will need colonoscopy as an outpatient can be associated with colon cancer Continue ceftriaxone through 1/17  Hypoxemic Respiratory Failure  Possible aspiration pneumonia.  Continue Brovana, Pulmicort Monitor for airway protection NT suctioning as needed  Acute Metabolic / Toxic Encephalopathy  Off Precedex Continue Seroquel.  Monitor mental status  Normocytic anemia Follow CBC  Renal Failure, likely ATN   She is making some urine, electrolytes are okay Continue gentle IV fluids Discussed with nephrology.  Will remove femoral HD cath as it is over 74 weeks old In case she needs to go back on dialysis then will ask IR to place tunneled cath  UDS Positive: Amphetamines, Cannabis, TCA's  Cessation counseling when able   Bipolar Disorder Chronic Pain  Continue home vilazodone Continue Seroquel to 50 mg twice daily to help with agitation (she was on 300 mg nightly at home).  May decide to uptitrate  Hyperglycemia Sliding scale insulin  HTN Increase Norvasc to 10mg .  Labetelol PRN  At Risk Malnutrition  Continue tube feeding until she can pass swallowing evaluation  Stable for transfer out of ICU and to PCU, hospitalist service  Best Practice (right click and "Reselect all SmartList Selections" daily)  Diet/type: tubefeeds  DVT prophylaxis: prophylactic heparin  GI prophylaxis: PPI Lines: Central line and Dialysis Catheter (PICC 1/10-, R fem HD cath 1/2-)   Foley:  Yes, and it is still needed Code Status:   full code Last date of multidisciplinary goals of care discussion: Discussed with son POA Roanna Reaves 03/26/22.   Marshell Garfinkel MD [facility] See Amion for pager  If no response to pager , please call [phone] until 7pm After 7:00 pm call Elink  [phone] 03/26/2022, 8:17 AM

## 2022-03-26 NOTE — Progress Notes (Signed)
[place] Progress Note Patient Name: TRISTIAN BOUSKA DOB: 08-26-1965 MRN: [id number]   Date of Service  03/26/2022  HPI/Events of Note  Pt having mild pain. Unable to tell the nurse exactly where. He has been giving tylenol but tylenol is not ordered for pain. May she get something for pain or update tylenol order?  Also do you want to add ALT/ AST to AM labs because on 12/30 they were extremely elevated and then normal 1/4  Discussed with RN.  eICU Interventions  Has core track, Tylenol ordered Add ast/alt to AM lab     Intervention Category Intermediate Interventions: Pain - evaluation and management  Elmer Sow 03/26/2022, 6:33 AM

## 2022-03-27 DIAGNOSIS — A419 Sepsis, unspecified organism: Secondary | ICD-10-CM | POA: Diagnosis not present

## 2022-03-27 DIAGNOSIS — R6521 Severe sepsis with septic shock: Secondary | ICD-10-CM | POA: Diagnosis not present

## 2022-03-27 LAB — RENAL FUNCTION PANEL
Albumin: 2 g/dL — ABNORMAL LOW (ref 3.5–5.0)
Anion gap: 12 (ref 5–15)
BUN: 73 mg/dL — ABNORMAL HIGH (ref 6–20)
CO2: 23 mmol/L (ref 22–32)
Calcium: 9 mg/dL (ref 8.9–10.3)
Chloride: 105 mmol/L (ref 98–111)
Creatinine, Ser: 4.15 mg/dL — ABNORMAL HIGH (ref 0.44–1.00)
GFR, Estimated: 12 mL/min — ABNORMAL LOW (ref >60)
Glucose, Bld: 123 mg/dL — ABNORMAL HIGH (ref 70–99)
Phosphorus: 6.8 mg/dL — ABNORMAL HIGH (ref 2.5–4.6)
Potassium: 4.3 mmol/L (ref 3.5–5.1)
Sodium: 140 mmol/L (ref 135–145)

## 2022-03-27 LAB — CBC
HCT: 22.6 % — ABNORMAL LOW (ref 36.0–46.0)
Hemoglobin: 7.5 g/dL — ABNORMAL LOW (ref 12.0–15.0)
MCH: 31.4 pg (ref 26.0–34.0)
MCHC: 33.2 g/dL (ref 30.0–36.0)
MCV: 94.6 fL (ref 80.0–100.0)
Platelets: 465 10*3/uL — ABNORMAL HIGH (ref 150–400)
RBC: 2.39 MIL/uL — ABNORMAL LOW (ref 3.87–5.11)
RDW: 16 % — ABNORMAL HIGH (ref 11.5–15.5)
WBC: 17.2 10*3/uL — ABNORMAL HIGH (ref 4.0–10.5)
nRBC: 0.2 % (ref 0.0–0.2)

## 2022-03-27 LAB — GLUCOSE, CAPILLARY
Glucose-Capillary: 194 mg/dL — ABNORMAL HIGH (ref 70–99)
Glucose-Capillary: 121 mg/dL — ABNORMAL HIGH (ref 70–99)
Glucose-Capillary: 139 mg/dL — ABNORMAL HIGH (ref 70–99)
Glucose-Capillary: 169 mg/dL — ABNORMAL HIGH (ref 70–99)
Glucose-Capillary: 171 mg/dL — ABNORMAL HIGH (ref 70–99)
Glucose-Capillary: 86 mg/dL (ref 70–99)

## 2022-03-27 MED ORDER — LABETALOL HCL 5 MG/ML IV SOLN
5.0000 mg | INTRAVENOUS | Status: DC | PRN
  Administered 2022-03-27: 10 mg via INTRAVENOUS
  Administered 2022-04-04: 5 mg via INTRAVENOUS
  Filled 2022-03-27 (×3): qty 4

## 2022-03-27 NOTE — Evaluation (Signed)
Physical Therapy Evaluation Patient Details Name: Darlene Barber MRN: [id number] DOB: 1965/04/17 Today's Date: 03/27/2022  History of Present Illness  57 yo female admitted 12/30 to [facility] with abdominal pain and rash with sepsis and AKI. Respiratory failure intubated 1/2-1/11. CRRT (1/3-1/8, 1/10-1/11). pt also with metabolic encephalopathy acutely. PMhx: chronic pain, GERD, bipolar, tobacco and THC abuse, insomnia, HTN, tremor  Clinical Impression   Pt admitted secondary to problem above with deficits below. PTA patient was independent and living with boyfriend in 2nd story apartment. Patient has a knee that gives out and she has had 3 falls in past months. She has refused to use an assistive device. (Per boyfriend as pt with encephalopathy and non-verbal during session).  Pt currently requires total assist for all mobility and is unsafe to sit at EOB at this time. Boyfriend concerned that pt "looks different" today and reports yesterday she was awake, talking (almost in sentences), and moving all extremities. Claiborne Billings, RN made aware and she messaged MD. Currently recommending SNF due to level of assist she requires. Will continue to monitor/update discharge plan as pt progresses. Anticipate patient will benefit from PT to address problems listed below.Will continue to follow acutely to maximize functional mobility independence and safety.          Recommendations for follow up therapy are one component of a multi-disciplinary discharge planning process, led by the attending physician.  Recommendations may be updated based on patient status, additional functional criteria and insurance authorization.  Follow Up Recommendations Skilled nursing-short term rehab (<3 hours/day) Can patient physically be transported by private vehicle: No    Assistance Recommended at Discharge Frequent or constant Supervision/Assistance  Patient can return home with the following   (cannot return home to 2nd floor  apartment)    Equipment Recommendations Rolling walker (2 wheels)  Recommendations for Other Services  OT consult    Functional Status Assessment Patient has had a recent decline in their functional status and demonstrates the ability to make significant improvements in function in a reasonable and predictable amount of time.     Precautions / Restrictions Precautions Precautions: Fall;Other (comment) Precaution Comments: rectal tube, foley, cortrak      Mobility  Bed Mobility Overal bed mobility: Needs Assistance Bed Mobility: Supine to Sit     Supine to sit: Total assist     General bed mobility comments: elevated via bed to upright chair-like position with slight increase in arousal and followed one command; too lethargic to safely attempt EOB    Transfers                        Ambulation/Gait                  Stairs            Wheelchair Mobility    Modified Rankin (Stroke Patients Only)       Balance                                             Pertinent Vitals/Pain Pain Assessment Pain Assessment: Faces Faces Pain Scale: No hurt    Home Living Family/patient expects to be discharged to:: Private residence Living Arrangements: Spouse/significant other;Children (boyfriend and son) Available Help at Discharge: Family;Available 24 hours/day Type of Home: Apartment Home Access: Stairs to enter Entrance Stairs-Rails: Right;Left;Can reach both  Entrance Stairs-Number of Steps: 2 flights   Home Layout: One level Home Equipment: Grab bars - tub/shower;Cane - quad Additional Comments: information provided by boyfriend    Prior Function Prior Level of Function : Independent/Modified Independent;History of Falls (last six months)             Mobility Comments: has one knee that gives out sometimes and has had 3 falls in past several months (per boyfriend); she refuses to use quad cane that was given to her        Hand Dominance        Extremity/Trunk Assessment   Upper Extremity Assessment Upper Extremity Assessment: Difficult to assess due to impaired cognition (PROM WFL; low tone?)    Lower Extremity Assessment Lower Extremity Assessment: Difficult to assess due to impaired cognition (PROM WFL; low tone?)    Cervical / Trunk Assessment Cervical / Trunk Assessment: Other exceptions Cervical / Trunk Exceptions: overweight  Communication   Communication: Other (comment) (non-verbal during session)  Cognition Arousal/Alertness: Lethargic Behavior During Therapy: Flat affect Overall Cognitive Status: Difficult to assess                                 General Comments: not following any commands with extremities; stuck out tonge to command        General Comments General comments (skin integrity, edema, etc.): HR 120-122 at rest    Exercises     Assessment/Plan    PT Assessment Patient needs continued PT services  PT Problem List Decreased strength;Decreased activity tolerance;Decreased balance;Decreased mobility;Decreased cognition;Decreased knowledge of use of DME;Decreased safety awareness;Decreased knowledge of precautions;Cardiopulmonary status limiting activity;Impaired tone;Obesity       PT Treatment Interventions DME instruction;Gait training;Stair training;Functional mobility training;Therapeutic activities;Therapeutic exercise;Balance training;Neuromuscular re-education;Cognitive remediation;Patient/family education    PT Goals (Current goals can be found in the Care Plan section)  Acute Rehab PT Goals Patient Stated Goal: unable to state; boyfriend wants her to get well and able to return home PT Goal Formulation: With family Time For Goal Achievement: 04/10/22 Potential to Achieve Goals: Good    Frequency Min 2X/week     Co-evaluation               AM-PAC PT "6 Clicks" Mobility  Outcome Measure Help needed turning from your back to  your side while in a flat bed without using bedrails?: Total Help needed moving from lying on your back to sitting on the side of a flat bed without using bedrails?: Total Help needed moving to and from a bed to a chair (including a wheelchair)?: Total Help needed standing up from a chair using your arms (e.g., wheelchair or bedside chair)?: Total Help needed to walk in hospital room?: Total Help needed climbing 3-5 steps with a railing? : Total 6 Click Score: 6    End of Session   Activity Tolerance: Patient limited by lethargy Patient left: in bed;with call bell/phone within reach;with bed alarm set;with family/visitor present;with restraints reapplied (bil mitts) Nurse Communication: Other (comment) (boyfriend reports pt was much more awake yesterday, following commands, talking; RN to make MD aware) PT Visit Diagnosis: Muscle weakness (generalized) (M62.81);Difficulty in walking, not elsewhere classified (R26.2)    Time: 8921-1941 PT Time Calculation (min) (ACUTE ONLY): 18 min   Charges:   PT Evaluation $PT Eval Low Complexity: 1 Low           Arby Barrette, PT Acute Rehabilitation Services  Office [phone]   Rexanne Mano 03/27/2022, 10:48 AM

## 2022-03-27 NOTE — Progress Notes (Signed)
Speech Language Pathology Treatment: Dysphagia  Patient Details Name: Darlene Barber MRN: [id number] DOB: Feb 28, 1966 Today's Date: 03/27/2022 Time: 0037-0488 SLP Time Calculation (min) (ACUTE ONLY): 18 min  Assessment / Plan / Recommendation Clinical Impression  Pt still encephalopathic, not responding verbally, needs cues. She was eager to drink water and also tolerated some bites of pop tart with prolonged mastication. She will need upright posture to prevent reflux. Posted signs. Will start Dys 3 diet and thin liquids and see if she has any appetite.   HPI HPI: Pt is a 57 year old female admitted with  abdominal pain, diarrhea.  Found to have Streptococcus Infantarius Bacteremia. Ultimately needed intubation, CRRT, pressors.  CT chest abdomen pelvis with fluid throughout the stomach, small bowel and colon, nonspecific but may represent diarrheal state.  No bowel obstruction, definite bowel wall thickening or pneumoperitoneum.  Fluid within the esophagus may represent reflux or nonspecific esophageal motility disorder. Intubated from 12/31 from 1/11, precedex weaned on 1/14.      SLP Plan  Continue with current plan of care      Recommendations for follow up therapy are one component of a multi-disciplinary discharge planning process, led by the attending physician.  Recommendations may be updated based on patient status, additional functional criteria and insurance authorization.    Recommendations  Diet recommendations: Dysphagia 3 (mechanical soft);Thin liquid Liquids provided via: Cup;Straw Medication Administration: Whole meds with liquid Supervision: Staff to assist with self feeding;Full supervision/cueing for compensatory strategies Compensations: Slow rate;Small sips/bites                Oral Care Recommendations: Oral care BID Follow Up Recommendations: No SLP follow up Plan: Continue with current plan of care           Darlene Barber, Darlene Barber  03/27/2022,  9:28 AM

## 2022-03-27 NOTE — Progress Notes (Signed)
Patient ID: Darlene Barber, female   DOB: March 04, 1966, 57 y.o.   MRN: [id number] S:Transferred to the floor yesterday.  No events overnight.  Right femoral HD catheter removed yesterday.  O:BP (!) 162/85 (BP Location: Right Arm)   Pulse 98   Temp 98.3 F (36.8 C) (Oral)   Resp 18   Ht 4\' 10"  (1.473 m)   Wt 77.4 kg   SpO2 94%   BMI 35.66 kg/m   Intake/Output Summary (Last 24 hours) at 03/27/2022 0851 Last data filed at 03/27/2022 0600 Gross per 24 hour  Intake 786.13 ml  Output 2900 ml  Net -2113.87 ml   Intake/Output: I/O last 3 completed shifts: In: 1972.2 [I.V.:657.5; NG/GT:1214.7; IV Piggyback:100] Out: 3025 [IRCVE:9381; OFBPZ:0258]  Intake/Output this shift:  No intake/output data recorded. Weight change: 0.4 kg Gen: sleeping comfortably CVS: RRR Resp: CTA Abd: +BS, soft, mildly tender to palpation, no guarding or rebound Ext: no edema  Recent Labs  Lab 03/21/22 0422 03/21/22 1730 03/22/22 0101 03/22/22 0801 03/23/22 0349 03/24/22 0549 03/25/22 0411 03/26/22 0438 03/26/22 0734 03/27/22 0311  NA 134* 137 136 135 139 142 142 140  --  140  K 5.0 5.0 5.1 4.7 4.9 5.5* 5.6* 4.3  --  4.3  CL 102 105  --  105 108 112* 114* 106  --  105  CO2 23 23  --  21* 18* 18* 16* 22  --  23  GLUCOSE 191* 235*  --  139* 179* 192* 162* 159*  --  123*  BUN 38* 37*  --  44* 64* 82* 98* 62*  --  73*  CREATININE 2.23* 1.79*  --  2.74* 3.91* 4.96* 5.29* 3.58*  --  4.15*  ALBUMIN 1.8* 1.9*  --  1.6*  --  1.8* 1.8* 1.9* 2.0* 2.0*  CALCIUM 7.9* 8.0*  --  8.3* 8.5* 8.9 9.0 8.3*  --  9.0  PHOS 4.5 3.2  --  4.4  --  7.9* 8.4* 6.3*  --  6.8*  AST  --   --   --   --   --   --   --   --  24  --   ALT  --   --   --   --   --   --   --   --  27  --    Liver Function Tests: Recent Labs  Lab 03/26/22 0438 03/26/22 0734 03/27/22 0311  AST  --  24  --   ALT  --  27  --   ALKPHOS  --  94  --   BILITOT  --  0.4  --   PROT  --  5.6*  --   ALBUMIN 1.9* 2.0* 2.0*   No results for input(s):  "LIPASE", "AMYLASE" in the last 168 hours. No results for input(s): "AMMONIA" in the last 168 hours. CBC: Recent Labs  Lab 03/22/22 1550 03/23/22 0349 03/24/22 0549 03/26/22 0438 03/27/22 0311  WBC 18.2* 16.6* 16.3* 16.3* 17.2*  HGB 8.3* 7.8* 8.3* 8.0* 7.5*  HCT 24.1* 23.2* 24.5* 23.4* 22.6*  MCV 93.4 93.5 93.5 93.2 94.6  PLT 399 435* 504* 494* 465*   Cardiac Enzymes: No results for input(s): "CKTOTAL", "CKMB", "CKMBINDEX", "TROPONINI" in the last 168 hours. CBG: Recent Labs  Lab 03/26/22 1604 03/26/22 2026 03/26/22 2328 03/27/22 0317 03/27/22 0816  GLUCAP 158* 136* 135* 121* 139*    Iron Studies: No results for input(s): "IRON", "TIBC", "TRANSFERRIN", "FERRITIN" in the last 72 hours.  Studies/Results: No results found.  amLODipine  10 mg Per Tube Daily   arformoterol  15 mcg Nebulization BID   budesonide (PULMICORT) nebulizer solution  0.5 mg Nebulization BID   Chlorhexidine Gluconate Cloth  6 each Topical Daily   darbepoetin (ARANESP) injection - DIALYSIS  40 mcg Subcutaneous Q Fri-1800   fiber supplement (BANATROL TF)  60 mL Per Tube BID   heparin injection (subcutaneous)  5,000 Units Subcutaneous Q8H   insulin aspart  0-15 Units Subcutaneous Q4H   insulin aspart  3 Units Subcutaneous Q4H   mouth rinse  15 mL Mouth Rinse 4 times per day   pantoprazole (PROTONIX) IV  40 mg Intravenous Daily   QUEtiapine  50 mg Per Tube BID   revefenacin  175 mcg Nebulization Daily   sodium chloride flush  10-40 mL Intracatheter Q12H   Vilazodone HCl  40 mg Per Tube Daily    BMET    Component Value Date/Time   NA 140 03/27/2022 0311   NA 137 04/20/2012 1725   K 4.3 03/27/2022 0311   K 4.2 04/20/2012 1725   CL 105 03/27/2022 0311   CL 106 04/20/2012 1725   CO2 23 03/27/2022 0311   CO2 24 04/20/2012 1725   GLUCOSE 123 (H) 03/27/2022 0311   GLUCOSE 97 04/20/2012 1725   BUN 73 (H) 03/27/2022 0311   BUN 14 04/20/2012 1725   CREATININE 4.15 (H) 03/27/2022 0311   CREATININE  0.83 09/30/2019 1057   CALCIUM 9.0 03/27/2022 0311   CALCIUM 9.2 04/20/2012 1725   GFRNONAA 12 (L) 03/27/2022 0311   GFRNONAA 81 09/30/2019 1057   GFRAA 93 09/30/2019 1057   CBC    Component Value Date/Time   WBC 17.2 (H) 03/27/2022 0311   RBC 2.39 (L) 03/27/2022 0311   HGB 7.5 (L) 03/27/2022 0311   HGB 16.7 (H) 04/20/2012 1725   HCT 22.6 (L) 03/27/2022 0311   HCT 50.0 (H) 04/20/2012 1725   PLT 465 (H) 03/27/2022 0311   PLT 331 04/20/2012 1725   MCV 94.6 03/27/2022 0311   MCV 98 04/20/2012 1725   MCH 31.4 03/27/2022 0311   MCHC 33.2 03/27/2022 0311   RDW 16.0 (H) 03/27/2022 0311   RDW 14.2 04/20/2012 1725   LYMPHSABS 1.4 03/09/2022 1350   LYMPHSABS 2.7 08/27/2011 0558   MONOABS 2.0 (H) 03/09/2022 1350   MONOABS 0.5 08/27/2011 0558   EOSABS 0.2 03/09/2022 1350   EOSABS 0.5 08/27/2011 0558   BASOSABS 0.1 03/09/2022 1350   BASOSABS 0.1 08/27/2011 0558    Assessment/ Plan: AKI - crt of 0.8 in August of 2023, bland UA in October of 23.  Now with AKI , proteinuria and hematuria but also pyuria.  Seems most likely to be AKI from ATN in the setting of bacteremia. Given the proteinuria and hematuria concerning for GN, some serologies were sent and were generally negative (see above). 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 SP CRRT 1/02- 1/11. Tx'd to [facility] 1/11. UOP was low and wts were dropping down, so cautious IVF's started for suspected vol depletion.  UOP improving now w/ 1865 cc overnight. Rate of rise in BUN/Cr is slowing  Continue with IVF's.  Hopefully starting to see some renal recovery.   Will hold off on HD and follow UOP and Scr. Right femoral HD catheter removed 03/26/22.   Will follow response and decide if she will need a tunneled catheter and ongoing HD.  Hyperkalemia - lokelma x 2 and switching TF's to Nepro which has resolved issue. Volume - persistent infiltrates on CXR are likely infection-related.  HTN -  BP's have improved.  Back on scheduled meds.  Normocytic Anemia  - deferring IV iron in setting of infection.  Aranesp 40 mcg every Thursday and transfuse prn Strep bacteremia-  streptococcus infantarius, TEE negative, getting Rocephin thru 1/17. Colonoscopy when more stable per pmd.  Acute hypoxic respiratory failure - extubated on 1/10, on high level [place] O2 now. Repeat CXR's show persistent bilat infiltrates as above H/o polysubstance abuse  Donetta Potts, MD [facility]

## 2022-03-27 NOTE — Progress Notes (Signed)
PROGRESS NOTE    Darlene Barber  SWH:[id number] DOB: 08-Dec-1965 DOA: 03/10/2022 PCP: [name], MD   Brief Narrative:  [name] who presented to the [facility] ER on 12/31 with reports of abdominal pain and widespread rash. She was evaluated in the emergency room and was found to have oxygen saturations of 87% on 2 L, afebrile with an elevated white blood cell count of 29.2.  Additional labs notable for acute kidney injury, anion gap metabolic acidosis and LFTs with mixed hepatic and cholestatic picture.  Stool samples were sent with workup of acute diarrhea.  Stool pathogen panel was negative.  C. difficile toxin was negative.  She was treated with 30 mL/kg IV fluid and started on empiric broad-spectrum antibiotics with vancomycin, cefepime and Flagyl.  She remained hypotensive despite IV fluid boluses and was started on vasopressors. PCCM consulted for ICU admission.  12/31 admit with abdominal pain, diarrhea.  Left IJ TLC placement.  CT chest abdomen pelvis with fluid throughout the stomach, small bowel and colon, nonspecific but may represent diarrheal state.  No bowel obstruction, definite bowel wall thickening or pneumoperitoneum.  Fluid within the esophagus may represent reflux or nonspecific esophageal motility disorder.  Trace amount of free pelvic fluid, nonspecific.  UDS positive for tricyclics, THC and amphetamines.  C. difficile negative, GI PCR negative.  COVID, flu, RSV negative.  Antibiotics narrowed to Zosyn. BC positives for strep infantarius.  1/3 CRRT, PEEP 5/60% 1/4 CRRT with negative balance. Precedex added to Fentanyl.  1/5 CT chest abd pelvis, TEE done with no evidence of vegetations 1/8 Taken off CRRT, MIVF started, weaned pressors 1/9 too wild on precedex, severe HTN prevented extubation 1/10 restarted on CRRT 1/11 extubated, transferred from [facility] for [facility]HD 1/13 Clonazepam 0.5 twice daily, Seroquel to twice daily 1/14 Precedex weaned off 1/17 - transfer to [place]:   Principal Problem:   Septic shock ([place]) Active Problems:   AKI (acute kidney injury) ([place])   Bacteremia   Rash   Acute respiratory failure with hypoxia (HCC)   Acute encephalopathy  Septic shock in setting of Streptococcus Infantarius Bacteremia  Levophed required initially to maintain MAP >65 - weaned off in ICU Repeat cultures om 1/3 negative.  TEE negative CT abdomen pelvis with no clear evidence of malignancy.   Will need colonoscopy as an outpatient can be associated with colon cancer Completed antibiotics 03/27/22   Hypoxemic Respiratory Failure, improving  Possible aspiration pneumonia.  Continue Brovana, Pulmicort Monitor for airway protection NT suctioning as needed   Acute Metabolic / Toxic Encephalopathy Cannot rule out substance abuse/withdrawal Remains confused, awakens to voice and tracking but not following commands or speaking.  Likely multifactorial in the setting of sedation, poor renal function and polypharmacy Off Precedex Continue Seroquel.   Normocytic anemia Follow CBC   Renal Failure, likely ATN, stabilizing In the setting of septic shock Urine output improving, creatinine labile but generally downtrending Nephrology following, appreciate insight recommendations Continue gentle IV fluids Discussed with nephrology.  Will remove femoral HD cath as it is over 77 weeks old In case she needs to go back on dialysis then will ask IR to place tunneled cath   UDS Positive: Amphetamines, Cannabis, TCA's  Cessation counseling when able    Bipolar Disorder Chronic Pain  Continue home vilazodone Continue Seroquel to 50 mg twice daily to help with agitation (she was on 300 mg nightly at home).  May decide to uptitrate   Hyperglycemia  Sliding scale insulin   HTN Increase Norvasc to 10mg .  Labetelol PRN   At Risk Malnutrition  Continue tube feeding until she can pass swallowing evaluation  DVT prophylaxis:  heparin Code Status: Full Family Communication: Non present  Status is: Inpt  Dispo: The patient is from: Home              Anticipated d/c is to: TBD              Anticipated d/c date is: TBD              Patient currently NOT medically stable for discharge  Consultants:  PCCM, Nephro  Antimicrobials:  Completed 03/27/22   Subjective: No acute issues/events overnight  Objective: Vitals:   03/26/22 2018 03/26/22 2317 03/27/22 0315 03/27/22 0500  BP: (!) 162/97 (!) 170/94 (!) 162/85   Pulse: (!) 104 (!) 105 99   Resp: 17 17 17    Temp: 99.1 F (37.3 C) 98.7 F (37.1 C) 99.2 F (37.3 C)   TempSrc: Oral Oral Oral   SpO2: 95% 95% 93%   Weight:    77.4 kg  Height:        Intake/Output Summary (Last 24 hours) at 03/27/2022 0730 Last data filed at 03/27/2022 0600 Gross per 24 hour  Intake 870.08 ml  Output 2900 ml  Net -2029.92 ml   Filed Weights   03/25/22 0924 03/25/22 1255 03/27/22 0500  Weight: 77 kg 77 kg 77.4 kg    Examination:  General:  Pleasantly resting in bed, No acute distress.  Not interactive/does not follow commands HEENT:  Normocephalic atraumatic.  Sclerae nonicteric, noninjected.  Extraocular movements intact bilaterally. Neck:  Without mass or deformity.  Trachea is midline. Lungs:  Clear to auscultate bilaterally without rhonchi, wheeze, or rales. Heart:  Regular rate and rhythm.  Without murmurs, rubs, or gallops. Abdomen:  Soft, nontender, nondistended.  Without guarding or rebound. Extremities: Without cyanosis, clubbing, edema, or obvious deformity. Vascular:  Dorsalis pedis and posterior tibial pulses palpable bilaterally.  Data Reviewed: I have personally reviewed following labs and imaging studies  CBC: Recent Labs  Lab 03/22/22 1550 03/23/22 0349 03/24/22 0549 03/26/22 0438 03/27/22 0311  WBC 18.2* 16.6* 16.3* 16.3* 17.2*  HGB 8.3* 7.8* 8.3* 8.0* 7.5*  HCT 24.1* 23.2* 24.5* 23.4* 22.6*  MCV 93.4 93.5 93.5 93.2 94.6  PLT 399  435* 504* 494* 778*   Basic Metabolic Panel: Recent Labs  Lab 03/21/22 0422 03/21/22 1730 03/22/22 0801 03/23/22 0349 03/24/22 0549 03/25/22 0411 03/26/22 0438 03/27/22 0311  NA 134*   < > 135 139 142 142 140 140  K 5.0   < > 4.7 4.9 5.5* 5.6* 4.3 4.3  CL 102   < > 105 108 112* 114* 106 105  CO2 23   < > 21* 18* 18* 16* 22 23  GLUCOSE 191*   < > 139* 179* 192* 162* 159* 123*  BUN 38*   < > 44* 64* 82* 98* 62* 73*  CREATININE 2.23*   < > 2.74* 3.91* 4.96* 5.29* 3.58* 4.15*  CALCIUM 7.9*   < > 8.3* 8.5* 8.9 9.0 8.3* 9.0  MG 3.4*  --   --   --   --  2.9*  --   --   PHOS 4.5   < > 4.4  --  7.9* 8.4* 6.3* 6.8*   < > = values in this interval not displayed.   GFR: Estimated Creatinine Clearance: 13.3 mL/min (A) (by C-G formula  based on SCr of 4.15 mg/dL (H)). Liver Function Tests: Recent Labs  Lab 03/24/22 0549 03/25/22 0411 03/26/22 0438 03/26/22 0734 03/27/22 0311  AST  --   --   --  24  --   ALT  --   --   --  27  --   ALKPHOS  --   --   --  94  --   BILITOT  --   --   --  0.4  --   PROT  --   --   --  5.6*  --   ALBUMIN 1.8* 1.8* 1.9* 2.0* 2.0*   No results for input(s): "LIPASE", "AMYLASE" in the last 168 hours. No results for input(s): "AMMONIA" in the last 168 hours. Coagulation Profile: No results for input(s): "INR", "PROTIME" in the last 168 hours. Cardiac Enzymes: No results for input(s): "CKTOTAL", "CKMB", "CKMBINDEX", "TROPONINI" in the last 168 hours. BNP (last 3 results) No results for input(s): "PROBNP" in the last 8760 hours. HbA1C: No results for input(s): "HGBA1C" in the last 72 hours. CBG: Recent Labs  Lab 03/26/22 1114 03/26/22 1604 03/26/22 2026 03/26/22 2328 03/27/22 0317  GLUCAP 132* 158* 136* 135* 121*   Lipid Profile: No results for input(s): "CHOL", "HDL", "LDLCALC", "TRIG", "CHOLHDL", "LDLDIRECT" in the last 72 hours. Thyroid Function Tests: No results for input(s): "TSH", "T4TOTAL", "FREET4", "T3FREE", "THYROIDAB" in the last 72  hours. Anemia Panel: No results for input(s): "VITAMINB12", "FOLATE", "FERRITIN", "TIBC", "IRON", "RETICCTPCT" in the last 72 hours. Sepsis Labs: No results for input(s): "PROCALCITON", "LATICACIDVEN" in the last 168 hours.  Recent Results (from the past 240 hour(s))  Surgical PCR screen     Status: None   Collection Time: 03/22/22  1:17 AM   Specimen: Nasal Mucosa; Nasal Swab  Result Value Ref Range Status   MRSA, PCR NEGATIVE NEGATIVE Final   Staphylococcus aureus NEGATIVE NEGATIVE Final    Comment: (NOTE) The Xpert SA Assay (FDA approved for NASAL specimens in patients 75 years of age and older), is one component of a comprehensive surveillance program. It is not intended to diagnose infection nor to guide or monitor treatment. Performed at [facility], [place] [address], [place], [place] [address]     Radiology Studies: No results found.  Scheduled Meds:  amLODipine  10 mg Per Tube Daily   arformoterol  15 mcg Nebulization BID   budesonide (PULMICORT) nebulizer solution  0.5 mg Nebulization BID   Chlorhexidine Gluconate Cloth  6 each Topical Daily   darbepoetin (ARANESP) injection - DIALYSIS  40 mcg Subcutaneous Q Fri-1800   fiber supplement (BANATROL TF)  60 mL Per Tube BID   heparin injection (subcutaneous)  5,000 Units Subcutaneous Q8H   insulin aspart  0-15 Units Subcutaneous Q4H   insulin aspart  3 Units Subcutaneous Q4H   mouth rinse  15 mL Mouth Rinse 4 times per day   pantoprazole (PROTONIX) IV  40 mg Intravenous Daily   QUEtiapine  50 mg Per Tube BID   revefenacin  175 mcg Nebulization Daily   sodium chloride flush  10-40 mL Intracatheter Q12H   Vilazodone HCl  40 mg Per Tube Daily   Continuous Infusions:  sodium chloride Stopped (03/17/22 0701)   sodium chloride     sodium chloride Stopped (03/22/22 1151)   sodium chloride Stopped (03/26/22 0841)   cefTRIAXone (ROCEPHIN)  IV Stopped (03/26/22 1000)   feeding supplement (NEPRO CARB STEADY) 40 mL/hr  (03/27/22 0022)    LOS: 17 days  Time spent: 67min  Kaya Klausing C Mialynn Shelvin, DO [facility]  If 7PM-7AM, please contact night-coverage [email or web address]  03/27/2022, 7:30 AM

## 2022-03-27 NOTE — Progress Notes (Signed)
   03/27/22 1220  Spiritual Encounters  Type of Visit Initial  Care provided to: Pt not available  Referral source Nurse (RN/NT/LPN)  OnCall Visit No   Chaplain responded to a call for a chaplain. Patient and significant other declined a visit at this time.   Danice Goltz [facility] [phone]

## 2022-03-28 DIAGNOSIS — K921 Melena: Secondary | ICD-10-CM

## 2022-03-28 DIAGNOSIS — A419 Sepsis, unspecified organism: Secondary | ICD-10-CM | POA: Diagnosis not present

## 2022-03-28 DIAGNOSIS — D649 Anemia, unspecified: Secondary | ICD-10-CM

## 2022-03-28 DIAGNOSIS — R6521 Severe sepsis with septic shock: Secondary | ICD-10-CM | POA: Diagnosis not present

## 2022-03-28 LAB — RENAL FUNCTION PANEL
Albumin: 1.9 g/dL — ABNORMAL LOW (ref 3.5–5.0)
Anion gap: 12 (ref 5–15)
BUN: 74 mg/dL — ABNORMAL HIGH (ref 6–20)
CO2: 20 mmol/L — ABNORMAL LOW (ref 22–32)
Calcium: 8.7 mg/dL — ABNORMAL LOW (ref 8.9–10.3)
Chloride: 104 mmol/L (ref 98–111)
Creatinine, Ser: 4.17 mg/dL — ABNORMAL HIGH (ref 0.44–1.00)
GFR, Estimated: 12 mL/min — ABNORMAL LOW (ref >60)
Glucose, Bld: 116 mg/dL — ABNORMAL HIGH (ref 70–99)
Phosphorus: 5.9 mg/dL — ABNORMAL HIGH (ref 2.5–4.6)
Potassium: 4.2 mmol/L (ref 3.5–5.1)
Sodium: 136 mmol/L (ref 135–145)

## 2022-03-28 LAB — CBC
HCT: 17.4 % — ABNORMAL LOW (ref 36.0–46.0)
Hemoglobin: 6 g/dL — CL (ref 12.0–15.0)
MCH: 32.4 pg (ref 26.0–34.0)
MCHC: 34.5 g/dL (ref 30.0–36.0)
MCV: 94.1 fL (ref 80.0–100.0)
Platelets: 373 10*3/uL (ref 150–400)
RBC: 1.85 MIL/uL — ABNORMAL LOW (ref 3.87–5.11)
RDW: 16.3 % — ABNORMAL HIGH (ref 11.5–15.5)
WBC: 18.2 10*3/uL — ABNORMAL HIGH (ref 4.0–10.5)
nRBC: 0.3 % — ABNORMAL HIGH (ref 0.0–0.2)

## 2022-03-28 LAB — GLUCOSE, CAPILLARY
Glucose-Capillary: 104 mg/dL — ABNORMAL HIGH (ref 70–99)
Glucose-Capillary: 114 mg/dL — ABNORMAL HIGH (ref 70–99)
Glucose-Capillary: 143 mg/dL — ABNORMAL HIGH (ref 70–99)
Glucose-Capillary: 144 mg/dL — ABNORMAL HIGH (ref 70–99)
Glucose-Capillary: 155 mg/dL — ABNORMAL HIGH (ref 70–99)
Glucose-Capillary: 161 mg/dL — ABNORMAL HIGH (ref 70–99)
Glucose-Capillary: 207 mg/dL — ABNORMAL HIGH (ref 70–99)

## 2022-03-28 LAB — HEMOGLOBIN AND HEMATOCRIT, BLOOD
HCT: 24.2 % — ABNORMAL LOW (ref 36.0–46.0)
Hemoglobin: 8.4 g/dL — ABNORMAL LOW (ref 12.0–15.0)

## 2022-03-28 LAB — PREPARE RBC (CROSSMATCH)

## 2022-03-28 MED ORDER — VILAZODONE HCL 20 MG PO TABS
40.0000 mg | ORAL_TABLET | Freq: Every day | ORAL | Status: DC
  Administered 2022-03-29 – 2022-04-23 (×26): 40 mg via ORAL
  Filled 2022-03-28 (×28): qty 2

## 2022-03-28 MED ORDER — PANTOPRAZOLE SODIUM 40 MG IV SOLR: 40.0000 mg | Freq: Two times a day (BID) | INTRAVENOUS | Status: DC

## 2022-03-28 MED ORDER — AMLODIPINE BESYLATE 10 MG PO TABS
10.0000 mg | ORAL_TABLET | Freq: Every day | ORAL | Status: DC
  Administered 2022-03-29: 10 mg via ORAL
  Filled 2022-03-28: qty 1

## 2022-03-28 MED ORDER — ACETAMINOPHEN 160 MG/5ML PO SOLN
650.0000 mg | Freq: Four times a day (QID) | ORAL | Status: DC | PRN
  Administered 2022-03-28 – 2022-03-30 (×2): 650 mg via ORAL
  Filled 2022-03-28 (×2): qty 20.3

## 2022-03-28 MED ORDER — NEPRO/CARBSTEADY PO LIQD
1000.0000 mL | ORAL | Status: DC
  Filled 2022-03-28: qty 1000

## 2022-03-28 MED ORDER — PANTOPRAZOLE INFUSION (NEW) - SIMPLE MED
8.0000 mg/h | INTRAVENOUS | Status: DC
  Administered 2022-03-28 – 2022-03-30 (×6): 8 mg/h via INTRAVENOUS
  Filled 2022-03-28 (×6): qty 100

## 2022-03-28 MED ORDER — SODIUM CHLORIDE 0.9% IV SOLUTION: Freq: Once | INTRAVENOUS | Status: AC

## 2022-03-28 MED ORDER — QUETIAPINE FUMARATE 50 MG PO TABS
50.0000 mg | ORAL_TABLET | Freq: Two times a day (BID) | ORAL | Status: DC
  Administered 2022-03-28 – 2022-04-01 (×8): 50 mg via ORAL
  Filled 2022-03-28 (×8): qty 1

## 2022-03-28 MED ORDER — RENA-VITE PO TABS
1.0000 | ORAL_TABLET | Freq: Every day | ORAL | Status: DC
  Administered 2022-03-28 – 2022-04-22 (×26): 1 via ORAL
  Filled 2022-03-28 (×26): qty 1

## 2022-03-28 MED ORDER — DOCUSATE SODIUM 50 MG/5ML PO LIQD: 100.0000 mg | Freq: Two times a day (BID) | ORAL | Status: DC | PRN

## 2022-03-28 MED ORDER — POLYETHYLENE GLYCOL 3350 17 G PO PACK: 17.0000 g | PACK | Freq: Every day | ORAL | Status: DC | PRN

## 2022-03-28 NOTE — H&P (Addendum)
..  re: Darlene Barber Date of birth: May 26, 1965 Date: 03/28/2022  TO WHOM IT MAY CONCERN:  Please be advised that the above named patient will require a short term nursing home stay, anticipated 30 days or less for rehabilitation and strengthening. The plan is for return home.

## 2022-03-28 NOTE — Progress Notes (Signed)
PROGRESS NOTE    Darlene Barber  KYH:[id number] DOB: 12-31-1965 DOA: 03/10/2022 PCP: [name], MD   Brief Narrative:  [name] who presented to the [facility] ER on 12/31 with reports of abdominal pain and widespread rash. She was evaluated in the emergency room and was found to have oxygen saturations of 87% on 2 L, afebrile with an elevated white blood cell count of 29.2.  Additional labs notable for acute kidney injury, anion gap metabolic acidosis and LFTs with mixed hepatic and cholestatic picture.  Stool samples were sent with workup of acute diarrhea.  Stool pathogen panel was negative.  C. difficile toxin was negative.  She was treated with 30 mL/kg IV fluid and started on empiric broad-spectrum antibiotics with vancomycin, cefepime and Flagyl.  She remained hypotensive despite IV fluid boluses and was started on vasopressors. PCCM consulted for ICU admission.  12/31 admit with abdominal pain, diarrhea.  Left IJ TLC placement.  CT chest abdomen pelvis with fluid throughout the stomach, small bowel and colon, nonspecific but may represent diarrheal state.  No bowel obstruction, definite bowel wall thickening or pneumoperitoneum.  Fluid within the esophagus may represent reflux or nonspecific esophageal motility disorder.  Trace amount of free pelvic fluid, nonspecific.  UDS positive for tricyclics, THC and amphetamines.  C. difficile negative, GI PCR negative.  COVID, flu, RSV negative.  Antibiotics narrowed to Zosyn. BC positives for strep infantarius.  1/3 CRRT, PEEP 5/60% 1/4 CRRT with negative balance. Precedex added to Fentanyl.  1/5 CT chest abd pelvis, TEE done with no evidence of vegetations 1/8 Taken off CRRT, MIVF started, weaned pressors 1/9 too wild on precedex, severe HTN prevented extubation 1/10 restarted on CRRT 1/11 extubated, transferred from [facility] for iHD 1/13 Clonazepam 0.5 twice daily, Seroquel to twice daily 1/14 Precedex weaned off 1/17 - transfer to [facility]  services 1/18 - overnight rectal tube noted to contain blood - 2u PRBC transfusion pending transfusion  Assessment & Plan:   Principal Problem:   Septic shock ([place]) Active Problems:   AKI (acute kidney injury) ([place])   Bacteremia   Rash   Acute respiratory failure with hypoxia ([place])   Acute encephalopathy  Septic shock in setting of Streptococcus Infantarius Bacteremia  Levophed required initially to maintain MAP >65 - weaned off in ICU Repeat cultures om 1/3 negative.  TEE negative CT abdomen pelvis with no clear evidence of malignancy.   Will need colonoscopy as an outpatient can be associated with colon cancer Completed antibiotics 03/27/22   Acute blood loss anemia secondary to GI bleed, presumed lower Chronic normocytic anemia likely of chronic disease Noted hematochezia overnight 1/17 with drop in Hgb - 2u PRBC transfusing GI consulted - considering history presumed lower bleed - conservative management for now Back on PPI gtt Colonoscopy once stable (as above)  Acute hypoxic respiratory failure, improving  Possible aspiration pneumonia.  Continue Brovana, Pulmicort Monitor for airway protection NT suctioning as needed   Acute Metabolic / Toxic Encephalopathy Cannot rule out substance abuse/withdrawal Remains confused, but more awake/alert today - sluggish to respond to questions but answers are accurate/appropriate Likely multifactorial in the setting of sedation, poor renal function and polypharmacy Off Precedex Continue Seroquel - wean sedation/CNS depressants as tolerated. Avoid rapid withdrawal given previous agitation..   Renal Failure, likely ATN, stabilizing In the setting of septic shock Urine output improving, creatinine labile but generally downtrending Nephrology following, appreciate insight recommendations Continue gentle IV fluids Discussed with nephrology.  Will remove femoral  HD cath as it is over 57 weeks old In case she needs to go back on dialysis  then will ask IR to place tunneled cath   UDS Positive: Amphetamines, Cannabis, TCA's  Cessation counseling when able    Bipolar Disorder Chronic Pain  Continue home vilazodone Continue Seroquel to 50 mg twice daily to help with agitation (she was on 300 mg nightly at home).  May decide to uptitrate   Hyperglycemia Sliding scale insulin   HTN Increase Norvasc to 10mg .  Labetelol PRN   At Risk for protein caloric malnutrition  Continue tube feeding until she can more appropriately take PO (Currently on dysphagia 3/thin liquid)  DVT prophylaxis: heparin Code Status: Full Family Communication: Non present  Status is: Inpt  Dispo: The patient is from: Home              Anticipated d/c is to: TBD              Anticipated d/c date is: TBD              Patient currently NOT medically stable for discharge  Consultants:  PCCM, Nephro  Antimicrobials:  Completed 03/27/22   Subjective: Acute hematochezia noted overnight - blood transfusion pending; ROS limited but denies abdominal pain, fevers, chills.  Objective: Vitals:   03/27/22 2358 03/28/22 0320 03/28/22 0500 03/28/22 0555  BP: (!) 148/88 136/89  (!) 154/89  Pulse: (!) 107 (!) 106  (!) 111  Resp: 20 20  17   Temp: 99.2 F (37.3 C) 98.8 F (37.1 C)  98.4 F (36.9 C)  TempSrc: Axillary Axillary  Axillary  SpO2: 95% 96%  95%  Weight:   77.6 kg   Height:        Intake/Output Summary (Last 24 hours) at 03/28/2022 0733 Last data filed at 03/28/2022 0419 Gross per 24 hour  Intake 551.05 ml  Output 1610 ml  Net -1058.95 ml    Filed Weights   03/25/22 1255 03/27/22 0500 03/28/22 0500  Weight: 77 kg 77.4 kg 77.6 kg    Examination:  General:  Pleasantly resting in bed, No acute distress. Follows simple commands/short questions HEENT:  Normocephalic atraumatic.  Sclerae nonicteric, noninjected.  Extraocular movements intact bilaterally. Neck:  Without mass or deformity.  Trachea is midline. Lungs:  Clear to  auscultate bilaterally without rhonchi, wheeze, or rales. Heart:  Regular rate and rhythm.  Without murmurs, rubs, or gallops. Abdomen:  Soft, nontender, nondistended.  Without guarding or rebound. Extremities: Without cyanosis, clubbing, edema, or obvious deformity. Vascular:  Dorsalis pedis and posterior tibial pulses palpable bilaterally.  Data Reviewed: I have personally reviewed following labs and imaging studies  CBC: Recent Labs  Lab 03/23/22 0349 03/24/22 0549 03/26/22 0438 03/27/22 0311 03/28/22 0530  WBC 16.6* 16.3* 16.3* 17.2* 18.2*  HGB 7.8* 8.3* 8.0* 7.5* 6.0*  HCT 23.2* 24.5* 23.4* 22.6* 17.4*  MCV 93.5 93.5 93.2 94.6 94.1  PLT 435* 504* 494* 465* 329    Basic Metabolic Panel: Recent Labs  Lab 03/24/22 0549 03/25/22 0411 03/26/22 0438 03/27/22 0311 03/28/22 0530  NA 142 142 140 140 136  K 5.5* 5.6* 4.3 4.3 4.2  CL 112* 114* 106 105 104  CO2 18* 16* 22 23 20*  GLUCOSE 192* 162* 159* 123* 116*  BUN 82* 98* 62* 73* 74*  CREATININE 4.96* 5.29* 3.58* 4.15* 4.17*  CALCIUM 8.9 9.0 8.3* 9.0 8.7*  MG  --  2.9*  --   --   --   PHOS  7.9* 8.4* 6.3* 6.8* 5.9*    GFR: Estimated Creatinine Clearance: 13.2 mL/min (A) (by C-G formula based on SCr of 4.17 mg/dL (H)). Liver Function Tests: Recent Labs  Lab 03/25/22 0411 03/26/22 0438 03/26/22 0734 03/27/22 0311 03/28/22 0530  AST  --   --  24  --   --   ALT  --   --  27  --   --   ALKPHOS  --   --  94  --   --   BILITOT  --   --  0.4  --   --   PROT  --   --  5.6*  --   --   ALBUMIN 1.8* 1.9* 2.0* 2.0* 1.9*    No results for input(s): "LIPASE", "AMYLASE" in the last 168 hours. No results for input(s): "AMMONIA" in the last 168 hours. Coagulation Profile: No results for input(s): "INR", "PROTIME" in the last 168 hours. Cardiac Enzymes: No results for input(s): "CKTOTAL", "CKMB", "CKMBINDEX", "TROPONINI" in the last 168 hours. BNP (last 3 results) No results for input(s): "PROBNP" in the last 8760  hours. HbA1C: No results for input(s): "HGBA1C" in the last 72 hours. CBG: Recent Labs  Lab 03/27/22 1245 03/27/22 1548 03/27/22 2006 03/28/22 0003 03/28/22 0318  GLUCAP 169* 171* 86 161* 114*    Lipid Profile: No results for input(s): "CHOL", "HDL", "LDLCALC", "TRIG", "CHOLHDL", "LDLDIRECT" in the last 72 hours. Thyroid Function Tests: No results for input(s): "TSH", "T4TOTAL", "FREET4", "T3FREE", "THYROIDAB" in the last 72 hours. Anemia Panel: No results for input(s): "VITAMINB12", "FOLATE", "FERRITIN", "TIBC", "IRON", "RETICCTPCT" in the last 72 hours. Sepsis Labs: No results for input(s): "PROCALCITON", "LATICACIDVEN" in the last 168 hours.  Recent Results (from the past 240 hour(s))  Surgical PCR screen     Status: None   Collection Time: 03/22/22  1:17 AM   Specimen: Nasal Mucosa; Nasal Swab  Result Value Ref Range Status   MRSA, PCR NEGATIVE NEGATIVE Final   Staphylococcus aureus NEGATIVE NEGATIVE Final    Comment: (NOTE) The Xpert SA Assay (FDA approved for NASAL specimens in patients 39 years of age and older), is one component of a comprehensive surveillance program. It is not intended to diagnose infection nor to guide or monitor treatment. Performed at [facility], [place] [address], [place], [place] [address]     Radiology Studies: No results found.  Scheduled Meds:  amLODipine  10 mg Per Tube Daily   arformoterol  15 mcg Nebulization BID   budesonide (PULMICORT) nebulizer solution  0.5 mg Nebulization BID   Chlorhexidine Gluconate Cloth  6 each Topical Daily   darbepoetin (ARANESP) injection - DIALYSIS  40 mcg Subcutaneous Q Fri-1800   fiber supplement (BANATROL TF)  60 mL Per Tube BID   insulin aspart  0-15 Units Subcutaneous Q4H   insulin aspart  3 Units Subcutaneous Q4H   mouth rinse  15 mL Mouth Rinse 4 times per day   pantoprazole (PROTONIX) IV  40 mg Intravenous Daily   QUEtiapine  50 mg Per Tube BID   revefenacin  175 mcg Nebulization  Daily   sodium chloride flush  10-40 mL Intracatheter Q12H   Vilazodone HCl  40 mg Per Tube Daily   Continuous Infusions:  sodium chloride Stopped (03/17/22 0701)   sodium chloride     sodium chloride Stopped (03/22/22 1151)   sodium chloride 45 mL/hr at 03/28/22 0122   feeding supplement (NEPRO CARB STEADY) 40 mL/hr (03/27/22 0022)    LOS:  18 days   Time spent: 83min  Jerrit Horen C Alani Sabbagh, DO [facility]  If 7PM-7AM, please contact night-coverage [email or web address]  03/28/2022, 7:33 AM

## 2022-03-28 NOTE — TOC Initial Note (Addendum)
Transition of Care [facility]) - Initial/Assessment Note    Patient Details  Name: Darlene Barber MRN: [id number] Date of Birth: 1966-01-10  Transition of Care [facility]) CM/SW Contact:    Jinger Neighbors, LCSW Phone Number: 03/28/2022, 12:38 PM  Clinical Narrative:                 CSW met with pt and her significant other at bedside to compelete assessment. Pt aao2, minus time and situation. CSW assessed for drug and alcohol use, but pt denies; however, toxicology reports show pt is positive for amphetamines and THC. CSW reviewed PT recs for SNF and pt agreed. CSW also consulted with Marjory Lies, pt's son via telephone and he was in agreement to SNF.  Work up and fax out complete   Expected Discharge Plan: Skilled Nursing Facility Barriers to Discharge: SNF Pending bed offer   Patient Goals and CMS Choice   CMS Medicare.gov Compare Post Acute Care list provided to:: Patient Choice offered to / list presented to : Patient, Adult Children      Expected Discharge Plan and Services       Living arrangements for the past 2 months: Single Family Home                                      Prior Living Arrangements/Services Living arrangements for the past 2 months: Single Family Home Lives with:: Adult Children, Significant Other Patient language and need for interpreter reviewed:: Yes Do you feel safe going back to the place where you live?: Yes      Need for Family Participation in Patient Care: Yes (Comment) Care giver support system in place?: Yes (comment)   Criminal Activity/Legal Involvement Pertinent to Current Situation/Hospitalization: No - Comment as needed  Activities of Daily Living Home Assistive Devices/Equipment: None ADL Screening (condition at time of admission) [name] cognitive ability adequate to safely complete daily activities?: Yes Is the patient deaf or have difficulty hearing?: No Does the patient have difficulty seeing, even when wearing glasses/contacts?:  No Does the patient have difficulty concentrating, remembering, or making decisions?: No Patient able to express need for assistance with ADLs?: No Does the patient have difficulty dressing or bathing?: No Independently performs ADLs?: Yes (appropriate for developmental age) Does the patient have difficulty walking or climbing stairs?: No Weakness of Legs: None Weakness of Arms/Hands: None  Permission Sought/Granted                  Emotional Assessment Appearance:: Appears older than stated age Attitude/Demeanor/Rapport: Apprehensive Affect (typically observed): Overwhelmed Orientation: : Oriented to Self Alcohol / Substance Use:  (pt denies) Psych Involvement: Yes (comment) ([facility], pt reports a mood disorder)  Admission diagnosis:  Septic shock ([place]) [A41.9, R65.21] Patient Active Problem List   Diagnosis Date Noted   Acute encephalopathy 03/22/2022   Bacteremia 03/15/2022   Rash 03/15/2022   Acute respiratory failure with hypoxia ([place]) 03/15/2022   AKI (acute kidney injury) ([place]) 03/12/2022   Septic shock ([place]) 03/10/2022   Balance disorder 06/29/2020   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   Centrilobular emphysema ([place]) 05/31/2019   Chronic upper extremity pain (Secondary Area of Pain) (Bilateral) (L>R) 03/16/2018   Primary osteoarthritis involving multiple joints 03/16/2018   Vitamin D deficiency 03/16/2018   Cervicogenic headache 03/16/2018   Cervical facet hypertrophy 03/16/2018   Cervical facet syndrome (Bilateral) (L>R) 03/16/2018   Cervical foraminal stenosis (C4-C7) (  Bilateral) (L>R) 03/16/2018   Cervical central spinal stenosis (C5-6) 03/16/2018   Cervical spondylitis w/ radiculitis (HCC) 03/16/2018   Cervical radiculitis (Bilateral) (L>R) 03/16/2018   Osteoarthritis of hips (Bilateral) 03/16/2018   DDD (degenerative disc disease), cervical 03/16/2018   Marijuana use 03/16/2018   Migraines 02/12/2018   Seasonal allergies 02/12/2018   Chronic neck  pain (Primary Area of Pain) (Bilateral) (L>R) 02/12/2018   Chronic hip pain (Tertiary Area of Pain) (Bilateral) (R>L) 02/12/2018   Chronic lower extremity pain (Fourth Area of Pain) (Right) 02/12/2018   Chronic pain syndrome 02/12/2018   Opiate use 02/12/2018   Pharmacologic therapy 02/12/2018   Disorder of skeletal system 02/12/2018   Problems influencing health status 02/12/2018   Hyperlipidemia associated with type 2 diabetes mellitus ([place]) 11/20/2017   Encounter for screening colonoscopy    Cervicalgia 10/17/2017   Tremor 09/16/2017   Drug-induced tremor 12/16/2016   Vasomotor flushing 09/10/2016   Post menopausal syndrome 08/21/2016   Controlled type 2 diabetes mellitus with complication, without long-term current use of insulin ([place]) 08/21/2016   Bipolar depression ([place]) 08/20/2016   Insomnia 08/20/2016   Essential hypertension 08/20/2016   Tobacco abuse 08/20/2016   Right leg swelling 01/30/2016   Trochanteric bursitis of hip (Right) 01/30/2016   Numbness and tingling 11/15/2014   Obesity (BMI 30.0-34.9) 11/15/2014   OSA on CPAP 11/15/2014   Abdominal bloating 12/06/2013   Mixed incontinence urge and stress 08/22/2012   Atrophy of vagina 08/22/2012   Dyspareunia 08/22/2012   Gastroesophageal reflux disease without esophagitis 06/19/2011   Flatulence, eructation and gas pain 06/19/2011   PCP:  [name], MD Pharmacy:   [facility] [place], [place] - [address] [place] [address] Phone: [phone] Fax: [phone]     Social Determinants of Health (SDOH) Social History: SDOH Screenings   Depression (PHQ2-9): High Risk (09/03/2021)  Tobacco Use: High Risk (03/17/2022)   SDOH Interventions:     Readmission Risk Interventions    03/11/2022   12:18 PM  Readmission Risk Prevention Plan  Transportation Screening Complete  Medication Review ([place]) Complete  PCP or Specialist  appointment within 3-5 days of discharge Complete  HRI or [place] Complete  SW Recovery Care/Counseling Consult Complete  [place] Not Applicable

## 2022-03-28 NOTE — Progress Notes (Addendum)
Patient ID: Darlene Barber, female   DOB: 1965/08/20, 57 y.o.   MRN: [id number] S: Feeling better today, more awake and alert. Had hematochezia last night/early am. O:BP (!) 173/97 (BP Location: Left Arm)   Pulse (!) 115   Temp (!) 101 F (38.3 C) (Oral)   Resp 17   Ht 4\' 10"  (1.473 m)   Wt 77.6 kg   SpO2 97%   BMI 35.76 kg/m   Intake/Output Summary (Last 24 hours) at 03/28/2022 1229 Last data filed at 03/28/2022 0649 Gross per 24 hour  Intake 451.05 ml  Output 2375 ml  Net -1923.95 ml   Intake/Output: I/O last 3 completed shifts: In: 885.7 [I.V.:451.1; NG/GT:434.7] Out: 5020 [Urine:3150; KGURK:2706]  Intake/Output this shift:  No intake/output data recorded. Weight change: 0.2 kg Gen: NAD CVS: tachy at 115 Resp:CTA Abd: distended, +BS, soft, NT/ND Ext: no edema  Recent Labs  Lab 03/21/22 1730 03/22/22 0101 03/22/22 0801 03/23/22 0349 03/24/22 0549 03/25/22 0411 03/26/22 0438 03/26/22 0734 03/27/22 0311 03/28/22 0530  NA 137   < > 135 139 142 142 140  --  140 136  K 5.0   < > 4.7 4.9 5.5* 5.6* 4.3  --  4.3 4.2  CL 105  --  105 108 112* 114* 106  --  105 104  CO2 23  --  21* 18* 18* 16* 22  --  23 20*  GLUCOSE 235*  --  139* 179* 192* 162* 159*  --  123* 116*  BUN 37*  --  44* 64* 82* 98* 62*  --  73* 74*  CREATININE 1.79*  --  2.74* 3.91* 4.96* 5.29* 3.58*  --  4.15* 4.17*  ALBUMIN 1.9*  --  1.6*  --  1.8* 1.8* 1.9* 2.0* 2.0* 1.9*  CALCIUM 8.0*  --  8.3* 8.5* 8.9 9.0 8.3*  --  9.0 8.7*  PHOS 3.2  --  4.4  --  7.9* 8.4* 6.3*  --  6.8* 5.9*  AST  --   --   --   --   --   --   --  24  --   --   ALT  --   --   --   --   --   --   --  27  --   --    < > = values in this interval not displayed.   Liver Function Tests: Recent Labs  Lab 03/26/22 0734 03/27/22 0311 03/28/22 0530  AST 24  --   --   ALT 27  --   --   ALKPHOS 94  --   --   BILITOT 0.4  --   --   PROT 5.6*  --   --   ALBUMIN 2.0* 2.0* 1.9*   No results for input(s): "LIPASE", "AMYLASE" in the  last 168 hours. No results for input(s): "AMMONIA" in the last 168 hours. CBC: Recent Labs  Lab 03/23/22 0349 03/24/22 0549 03/26/22 0438 03/27/22 0311 03/28/22 0530  WBC 16.6* 16.3* 16.3* 17.2* 18.2*  HGB 7.8* 8.3* 8.0* 7.5* 6.0*  HCT 23.2* 24.5* 23.4* 22.6* 17.4*  MCV 93.5 93.5 93.2 94.6 94.1  PLT 435* 504* 494* 465* 373   Cardiac Enzymes: No results for input(s): "CKTOTAL", "CKMB", "CKMBINDEX", "TROPONINI" in the last 168 hours. CBG: Recent Labs  Lab 03/27/22 1548 03/27/22 2006 03/28/22 0003 03/28/22 0318 03/28/22 0824  GLUCAP 171* 86 161* 114* 144*    Iron Studies: No results for input(s): "IRON", "TIBC", "  TRANSFERRIN", "FERRITIN" in the last 72 hours. Studies/Results: No results found.  sodium chloride   Intravenous Once   [START ON 03/29/2022] amLODipine  10 mg Oral Daily   arformoterol  15 mcg Nebulization BID   budesonide (PULMICORT) nebulizer solution  0.5 mg Nebulization BID   Chlorhexidine Gluconate Cloth  6 each Topical Daily   darbepoetin (ARANESP) injection - DIALYSIS  40 mcg Subcutaneous Q Fri-1800   fiber supplement (BANATROL TF)  60 mL Per Tube BID   insulin aspart  0-15 Units Subcutaneous Q4H   insulin aspart  3 Units Subcutaneous Q4H   mouth rinse  15 mL Mouth Rinse 4 times per day   [START ON 03/31/2022] pantoprazole  40 mg Intravenous Q12H   QUEtiapine  50 mg Oral BID   revefenacin  175 mcg Nebulization Daily   sodium chloride flush  10-40 mL Intracatheter Q12H   [START ON 03/29/2022] Vilazodone HCl  40 mg Oral Daily    BMET    Component Value Date/Time   NA 136 03/28/2022 0530   NA 137 04/20/2012 1725   K 4.2 03/28/2022 0530   K 4.2 04/20/2012 1725   CL 104 03/28/2022 0530   CL 106 04/20/2012 1725   CO2 20 (L) 03/28/2022 0530   CO2 24 04/20/2012 1725   GLUCOSE 116 (H) 03/28/2022 0530   GLUCOSE 97 04/20/2012 1725   BUN 74 (H) 03/28/2022 0530   BUN 14 04/20/2012 1725   CREATININE 4.17 (H) 03/28/2022 0530   CREATININE 0.83 09/30/2019  1057   CALCIUM 8.7 (L) 03/28/2022 0530   CALCIUM 9.2 04/20/2012 1725   GFRNONAA 12 (L) 03/28/2022 0530   GFRNONAA 81 09/30/2019 1057   GFRAA 93 09/30/2019 1057   CBC    Component Value Date/Time   WBC 18.2 (H) 03/28/2022 0530   RBC 1.85 (L) 03/28/2022 0530   HGB 6.0 (LL) 03/28/2022 0530   HGB 16.7 (H) 04/20/2012 1725   HCT 17.4 (L) 03/28/2022 0530   HCT 50.0 (H) 04/20/2012 1725   PLT 373 03/28/2022 0530   PLT 331 04/20/2012 1725   MCV 94.1 03/28/2022 0530   MCV 98 04/20/2012 1725   MCH 32.4 03/28/2022 0530   MCHC 34.5 03/28/2022 0530   RDW 16.3 (H) 03/28/2022 0530   RDW 14.2 04/20/2012 1725   LYMPHSABS 1.4 03/09/2022 1350   LYMPHSABS 2.7 08/27/2011 0558   MONOABS 2.0 (H) 03/09/2022 1350   MONOABS 0.5 08/27/2011 0558   EOSABS 0.2 03/09/2022 1350   EOSABS 0.5 08/27/2011 0558   BASOSABS 0.1 03/09/2022 1350   BASOSABS 0.1 08/27/2011 0558   Assessment/ Plan: AKI - crt of 0.8 in August of 2023, bland UA in October of 23.  Now with AKI , proteinuria and hematuria but also pyuria.  Seems most likely to be AKI from ATN in the setting of bacteremia. Given the proteinuria and hematuria concerning for GN, some serologies were sent and were generally negative (see above). 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 SP CRRT 1/02- 1/11. Tx'd to [facility] 1/11. UOP was low and wts were dropping down, so cautious IVF's started for suspected vol depletion.  UOP improving and Scr appears to have reached a plateau. Continue with IVF's.  Hopefully starting to see some renal recovery.   Will hold off on HD and follow UOP and Scr. Right femoral HD catheter removed 03/26/22.   Will follow response and decide if she will need a tunneled catheter and  ongoing HD.   Hyperkalemia - lokelma x 2 and switching TF's to Nepro which has resolved issue. Volume - persistent infiltrates on CXR are likely infection-related.  HTN - BP's have improved.  Back on  scheduled meds.  Normocytic Anemia  - deferring IV iron in setting of infection.  Aranesp 40 mcg every Thursday.  Hgb dropped to 6, will need blood transfusion.  Gi consulted for ABLA.  Strep bacteremia-  streptococcus infantarius, TEE negative, getting Rocephin thru 1/17. Colonoscopy when more stable per pmd.  Acute hypoxic respiratory failure - extubated on 1/10, on high level [place] O2 now. Repeat CXR's show persistent bilat infiltrates as above H/o polysubstance abuse  Donetta Potts, MD [facility]

## 2022-03-28 NOTE — [place] FL2 (Signed)
[place] MEDICAID FL2 LEVEL OF CARE FORM     IDENTIFICATION  Patient Name: Darlene Barber Birthdate: [birth year] Sex: female Admission Date (Current Location): 03/10/2022  [facility] and [place] Number:  Herbalist and Address:  [facility] [place]. [facility], [place] [address], [place], [place] [address]      Provider Number: [id number]  Attending Physician Name and Address:  Little Ishikawa, MD  Relative Name and Phone Number:  Ed Rayson [phone]    Current Level of Care: Hospital Recommended Level of Care: [place] Prior Approval Number:    Date Approved/Denied:   PASRR Number:    Discharge Plan: SNF    Current Diagnoses: Patient Active Problem List   Diagnosis Date Noted   Acute encephalopathy 03/22/2022   Bacteremia 03/15/2022   Rash 03/15/2022   Acute respiratory failure with hypoxia ([place]) 03/15/2022   AKI (acute kidney injury) ([place]) 03/12/2022   Septic shock ([place]) 03/10/2022   Balance disorder 06/29/2020   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   Centrilobular emphysema ([place]) 05/31/2019   Chronic upper extremity pain (Secondary Area of Pain) (Bilateral) (L>R) 03/16/2018   Primary osteoarthritis involving multiple joints 03/16/2018   Vitamin D deficiency 03/16/2018   Cervicogenic headache 03/16/2018   Cervical facet hypertrophy 03/16/2018   Cervical facet syndrome (Bilateral) (L>R) 03/16/2018   Cervical foraminal stenosis (C4-C7) (Bilateral) (L>R) 03/16/2018   Cervical central spinal stenosis (C5-6) 03/16/2018   Cervical spondylitis w/ radiculitis ([place]) 03/16/2018   Cervical radiculitis (Bilateral) (L>R) 03/16/2018   Osteoarthritis of hips (Bilateral) 03/16/2018   DDD (degenerative disc disease), cervical 03/16/2018   Marijuana use 03/16/2018   Migraines 02/12/2018   Seasonal allergies 02/12/2018   Chronic neck pain (Primary Area of Pain) (Bilateral) (L>R) 02/12/2018   Chronic hip pain (Tertiary Area of  Pain) (Bilateral) (R>L) 02/12/2018   Chronic lower extremity pain (Fourth Area of Pain) (Right) 02/12/2018   Chronic pain syndrome 02/12/2018   Opiate use 02/12/2018   Pharmacologic therapy 02/12/2018   Disorder of skeletal system 02/12/2018   Problems influencing health status 02/12/2018   Hyperlipidemia associated with type 2 diabetes mellitus ([place]) 11/20/2017   Encounter for screening colonoscopy    Cervicalgia 10/17/2017   Tremor 09/16/2017   Drug-induced tremor 12/16/2016   Vasomotor flushing 09/10/2016   Post menopausal syndrome 08/21/2016   Controlled type 2 diabetes mellitus with complication, without long-term current use of insulin ([place]) 08/21/2016   Bipolar depression ([place]) 08/20/2016   Insomnia 08/20/2016   Essential hypertension 08/20/2016   Tobacco abuse 08/20/2016   Right leg swelling 01/30/2016   Trochanteric bursitis of hip (Right) 01/30/2016   Numbness and tingling 11/15/2014   Obesity (BMI 30.0-34.9) 11/15/2014   OSA on CPAP 11/15/2014   Abdominal bloating 12/06/2013   Mixed incontinence urge and stress 08/22/2012   Atrophy of vagina 08/22/2012   Dyspareunia 08/22/2012   Gastroesophageal reflux disease without esophagitis 06/19/2011   Flatulence, eructation and gas pain 06/19/2011    Orientation RESPIRATION BLADDER Height & Weight     Self  Normal Indwelling catheter, Incontinent Weight: 171 lb 1.2 oz (77.6 kg) Height:  4\' 10"  (147.3 cm)  BEHAVIORAL SYMPTOMS/MOOD NEUROLOGICAL BOWEL NUTRITION STATUS      Incontinent Diet (See d/c summary)  AMBULATORY STATUS COMMUNICATION OF NEEDS Skin   Extensive Assist Verbally Normal                       Personal Care Assistance Level of Assistance  Bathing, Feeding, Dressing Bathing Assistance: Maximum assistance Feeding assistance: Limited assistance Dressing Assistance: Maximum assistance     Functional Limitations Info  Sight, Hearing, Speech Sight Info: Adequate Hearing Info: Adequate Speech Info:  Adequate    SPECIAL CARE FACTORS FREQUENCY  PT (By licensed PT), OT (By licensed OT)     PT Frequency: 5x/wk OT Frequency: 5x/wk            Contractures Contractures Info: Not present    Additional Factors Info  Code Status Code Status Info: Full             Current Medications (03/28/2022):  This is the current hospital active medication list Current Facility-Administered Medications  Medication Dose Route Frequency Provider Last Rate Last Admin   0.9 %  sodium chloride infusion (Manually program via Guardrails IV Fluids)   Intravenous Once Little Ishikawa, MD       0.9 %  sodium chloride infusion  250 mL Intravenous Continuous Rigoberto Noel, MD   Stopped at 03/17/22 0701   0.9 %  sodium chloride infusion   Intra-arterial PRN Mannam, Praveen, MD       0.9 %  sodium chloride infusion   Intravenous PRN Maryjane Hurter, MD   Stopped at 03/22/22 1151   0.9 %  sodium chloride infusion   Intravenous Continuous Roney Jaffe, MD 45 mL/hr at 03/28/22 0122 Infusion Verify at 03/28/22 0122   acetaminophen (TYLENOL) 160 MG/5ML solution 650 mg  650 mg Oral Q6H PRN Little Ishikawa, MD       albuterol (PROVENTIL) (2.5 MG/3ML) 0.083% nebulizer solution 2.5 mg  2.5 mg Nebulization Q4H PRN Rigoberto Noel, MD       [START ON 03/29/2022] amLODipine (NORVASC) tablet 10 mg  10 mg Oral Daily Little Ishikawa, MD       arformoterol [facility]) nebulizer solution 15 mcg  15 mcg Nebulization BID Kara Mead V, MD   15 mcg at 03/28/22 0830   budesonide (PULMICORT) nebulizer solution 0.5 mg  0.5 mg Nebulization BID Rigoberto Noel, MD   0.5 mg at 03/28/22 0830   Chlorhexidine Gluconate Cloth 2 % PADS 6 each  6 each Topical Daily Juanito Doom, MD   6 each at 03/28/22 0854   Darbepoetin Alfa (ARANESP) injection 40 mcg  40 mcg Subcutaneous Q Fri-1800 Iona Beard, MD   40 mcg at 03/22/22 1821   docusate (COLACE) 50 MG/5ML liquid 100 mg  100 mg Oral BID PRN Little Ishikawa, MD        feeding supplement (NEPRO CARB STEADY) liquid  40 mL/hr Per Tube Continuous Roney Jaffe, MD 40 mL/hr at 03/27/22 0022 40 mL/hr at 03/27/22 0022   fiber supplement (BANATROL TF) liquid 60 mL  60 mL Per Tube BID Mannam, Praveen, MD   60 mL at 03/28/22 0853   haloperidol lactate (HALDOL) injection 2 mg  2 mg Intravenous Q6H PRN Icard, Bradley L, DO   2 mg at 03/24/22 0824   insulin aspart (novoLOG) injection 0-15 Units  0-15 Units Subcutaneous Q4H Collene Gobble, MD   2 Units at 03/28/22 0854   insulin aspart (novoLOG) injection 3 Units  3 Units Subcutaneous Q4H Collene Gobble, MD   3 Units at 03/28/22 0854   labetalol (NORMODYNE) injection 5-10 mg  5-10 mg Intravenous Q2H PRN Etta Quill, DO   10 mg at 03/27/22 2053   lip balm (CARMEX) ointment   Topical PRN Collene Gobble, MD  75 Application at 96/22/29 1210   ondansetron (ZOFRAN) injection 4 mg  4 mg Intravenous Q6H PRN Rigoberto Noel, MD   4 mg at 03/21/22 2031   Oral care mouth rinse  15 mL Mouth Rinse 4 times per day Maryjane Hurter, MD   15 mL at 03/28/22 0855   [START ON 03/31/2022] pantoprazole (PROTONIX) injection 40 mg  40 mg Intravenous Q12H Little Ishikawa, MD       pantoprozole (PROTONIX) 80 mg /NS 100 mL infusion  8 mg/hr Intravenous Continuous Little Ishikawa, MD 10 mL/hr at 03/28/22 0859 8 mg/hr at 03/28/22 0859   polyethylene glycol (MIRALAX / GLYCOLAX) packet 17 g  17 g Oral Daily PRN Little Ishikawa, MD       QUEtiapine (SEROQUEL) tablet 50 mg  50 mg Oral BID Little Ishikawa, MD       revefenacin (YUPELRI) nebulizer solution 175 mcg  175 mcg Nebulization Daily Kara Mead V, MD   175 mcg at 03/28/22 0830   sodium chloride flush (NS) 0.9 % injection 10-40 mL  10-40 mL Intracatheter Q12H Maryjane Hurter, MD   10 mL at 03/27/22 2109   sodium chloride flush (NS) 0.9 % injection 10-40 mL  10-40 mL Intracatheter PRN Maryjane Hurter, MD       [START ON 03/29/2022] Vilazodone HCl TABS 40 mg  40  mg Oral Daily Little Ishikawa, MD         Discharge Medications: Please see discharge summary for a list of discharge medications.  Relevant Imaging Results:  Relevant Lab Results:   Additional Information SS#: [id number]  Jinger Neighbors, LCSW

## 2022-03-28 NOTE — Progress Notes (Signed)
This RN alerted by aid that some blood was around the anus and on the pads under patient. Upon further assessment it was noted a significant amount of red blood and medium size clots. The FMS was full with what appears to be mostly blood, dark red in color. Bag was full of 800cc mix with liquid fecal matter and blood.  Blood pressure remains elevated, continues to be tachycardic around 120s.  Rapid response nurse contacted, covering physician contacted.  See orders.

## 2022-03-28 NOTE — Progress Notes (Signed)
Nutrition Follow-up  DOCUMENTATION CODES:  Obesity unspecified  INTERVENTION:  Continue TF via cortrak until pt is able to consistently meet needs orally. Will adjust to nocturnal to determine if this aids in appetite return Nepro @ 75ml/h x 12h (600 ml per day) Provides 1062 kcal, 49 gm protein, 436 ml free water daily Continue current diet as ordered per SLP Encourage PO intake Renavite daily   NUTRITION DIAGNOSIS:  Inadequate oral intake related to inability to eat as evidenced by NPO status (on vent). - remains applicable  GOAL:  Patient will meet greater than or equal to 90% of their needs - progressing  MONITOR:  Vent status, TF tolerance, Labs, Weight trends  REASON FOR ASSESSMENT:  Consult Enteral/tube feeding initiation and management (trickle TF with recs)  ASSESSMENT:  57 yo female w/ pertinent PMH HTN, T2DM, OSA, urge incontinence, bipolar depression presents to ED on 12/30 w/ rash and sob. Found to have sepsis and acute hypoxemic respiratory failure.  12/30 Admit 1/1: intubated, Vital HP at 52mL/hr started 1/2 changed to Vital 1.5 at 75mL/hr 1/3 CRRT started 1/4 advanced to goal of Vital 1.5 at 40mL/hr 1/8 off CRRT, remains at goal TF 1/10 restarting CRRT 1/11 - extubated, transferred to [facility] for iHD   1/12 - cortrak placed 1/16 - SLP advance to full liquids 1/17 - SLP advanced to DYS 3  Pt reamins with cortrak tube in place with TF infusing at goal. Discussed intake with RN, states pt ate ~60% of her lunch. Will adjust TF to nocturnal to assess if pt's appetite is intact and able to meet needs orally.   Nutritionally Relevant Medications: Scheduled Meds:  fiber supplement (BANATROL TF)  60 mL Per Tube BID   insulin aspart  0-15 Units Subcutaneous Q4H   insulin aspart  3 Units Subcutaneous Q4H   Continuous Infusions:  sodium chloride 45 mL/hr at 03/28/22 0122   feeding supplement (NEPRO CARB STEADY) 40 mL/hr (03/27/22 0022)   pantoprazole 8  mg/hr (03/28/22 0859)   PRN Meds: docusate, ondansetron, polyethylene glycol  Labs Reviewed: BUN 74, creatinine 4.17 Phosphorus 5.9 Hgb 6.0%  NUTRITION - FOCUSED PHYSICAL EXAM: Flowsheet Row Most Recent Value  Orbital Region Mild depletion  Upper Arm Region No depletion  Thoracic and Lumbar Region No depletion  Buccal Region No depletion  Temple Region Mild depletion  Clavicle Bone Region No depletion  Clavicle and Acromion Bone Region No depletion  Scapular Bone Region No depletion  Dorsal Hand No depletion  Patellar Region Severe depletion  Anterior Thigh Region Severe depletion  Posterior Calf Region Severe depletion  Edema (RD Assessment) Mild  Hair --  [thinning]  Eyes Reviewed  Mouth Reviewed  [pale tongue and gums]  Skin --  [severe peeling to both hands]  Nails --  [thin]   Diet Order:   Diet Order             DIET DYS 3 Room service appropriate? No; Fluid consistency: Thin  Diet effective now                   EDUCATION NEEDS:  Not appropriate for education at this time  Skin:  Skin Assessment: Reviewed RN Assessment Skin Integrity Issues:: Other (Comment) Other: skin tears L knee  Last BM:  1/18  Height:  Ht Readings from Last 1 Encounters:  03/12/22 4\' 10"  (1.473 m)    Weight:  Wt Readings from Last 1 Encounters:  03/28/22 77.6 kg    Ideal Body Weight:  43.2 kg  BMI:  Body mass index is 35.76 kg/m.  Estimated Nutritional Needs:  Kcal:  1500-1700 kcals Protein:  75-90 g/d Fluid:  1L+UOP    Ranell Patrick, RD, LDN Clinical Dietitian RD pager # available in [facility]  After hours/weekend pager # available in [facility]

## 2022-03-28 NOTE — Consult Note (Signed)
Consultation Note   Referring Provider:  [facility] PCP: [name], MD Primary Gastroenterologist: [facility] ( [facility])        Reason for consultation: GI bleed   Hospital Day: 19   Brief narrative:  Darlene Barber is a 57 y.o. female with a past medical history significant for chronic constipation GERD, substance abuse, chronic pain syndrome, bipolar disorder, cholecystectomy, obesity.   Darlene Barber presented to ED on 12/31 with abdominal pain, diarrhea  and a widespread rash. C-diff negative. Hospital notes reviewed. She has had a prolonged hospitalization for sepsis, acute respiratory failure, AKI requiring HD, Despite fluids she remained hypotensive and required pressors. UDS positive for tricyclics, THC and amphetamines.   Assessment    # Acute anemia. 57 yo female admitted with septic shock / encephalopathy, respiratory failure and declining hgb since admission. . Hgb normal on admission but probably hemoconcentrated. During this admission her hgb has been declining, got a unit of blood on 03/22/22. Hgb stablized but last night had hematochezia with worsening anemia. .   # Hematochezia. Presumably a lower GI bleed. She has been having diarrhea ( stool studies negative). Had rectal tube. I spoke with RN who reviewed last shift nursing notes. Episode was described a large amount of red blood. No bleeding today. Rectal tube was removed and now has rectal pouch. Stercoral ulcer? Hemorrhoidal bleeding seems less likely. Neoplasm? Diverticular bleed possible but no diverticulosis reported on CT scan.  # Diarrhea. Etiology not clear. Stool path panel and C-diff negative.    # Acute metabolic / toxic encephalopathy.Likely multifactorial ( illness, polypharmacy, etc). Partially oriented . Cannot give reliable history.    # Septic shock with streptococcus Infantarius bacteremia. Completed antibiotics. TEE negative.   # Hypoxemic  respiratory failure, ? Aspiration PNA. Extubated several days ago.   # Elevated LFTs on admission. Mainly hepatocellular injury pattern. No liver abnormalities on non-contrast CT scan. Resolved.   # History of GERD. Fluid in esophagus on chest CT scan   # See PMH for additional medical problems  Plan   A unit of blood has already been ordered.  Seems this was lower GI bleeding last night. No bleeding today..  RN will let me know if there is further bleeding.  Will need colonoscopy when medically stable to evaluate bleeding and also rule out colon cancer.given streptococcus Infantarius  Will leave the PPI infusion going for today but probably d/c tomorrow as this seems more like a lower bleed with the information I was able to obtain.   HPI   Darlene Barber is partially oriented and cannot give a reliable history. Boyfriend is at bedside and offers that she has chronic constipation at home. He says she sometimes has blood in bowel movements at home.   Previous GI Evaluation    unknown   Recent Labs and Imaging CT CHEST ABDOMEN PELVIS WO CONTRAST  Result Date: 03/15/2022 CLINICAL DATA:  Sepsis, strep bacteremia. EXAM: CT CHEST, ABDOMEN AND PELVIS WITHOUT CONTRAST TECHNIQUE: Multidetector CT imaging of the chest, abdomen and pelvis was performed following the standard protocol without IV contrast. RADIATION DOSE REDUCTION: This exam was performed according to the departmental dose-optimization program which includes automated exposure control, adjustment of the mA and/or kV  according to patient size and/or use of iterative reconstruction technique. COMPARISON:  CT chest, abdomen and pelvis 03/09/2022. X-rays 03/15/2022. FINDINGS: CT CHEST FINDINGS Cardiovascular: Left IJ catheter in place. Heart is nonenlarged. No significant pericardial effusion. On this non IV contrast exam, the thoracic aorta has a normal course and caliber. Slight vascular calcification. Mediastinum/Nodes: On this non IV contrast  exam there is no specific abnormal lymph node enlargement seen in the axillary region, hilum. There are some small mediastinal nodes identified which are not pathologic by size criteria, unchanged from previous. Normal caliber thoracic esophagus with enteric tube in place. There is contrast in the esophagus. Please correlate for any evidence of reflux. On the prior the esophagus was air-filled and patulous Lungs/Pleura: ET tube in place with tip extending to the right main bronchus. Recommend this be retracted. No pneumothorax or effusion. Breathing motion is seen. Since the prior there has been development of patchy bilateral ill-defined parenchymal opacities with ground-glass components. These are diffusely distributed. Slightly more left upper and lower lobe than right. There is also an area of more dependent consolidative opacity along the right lower lobe which is new. Atelectasis versus infiltrate. Overall recommend follow-up. Musculoskeletal: Slight curvature of the spine with scattered degenerative changes. Right-sided os acromiale. CT ABDOMEN PELVIS FINDINGS Hepatobiliary: Overall preserved liver on this noncontrast examination. No obvious mass. Previous cholecystectomy. Pancreas: Moderate pancreatic atrophy, unchanged from prior. Spleen: Spleen is nonenlarged. Adrenals/Urinary Tract: Slight thickening of the left adrenal gland is stable. The right adrenal gland is preserved. Bilateral punctate nonobstructing renal stones. No collecting system dilatation. Foley catheter in the contracted urinary bladder. Stomach/Bowel: Rectal tube in place. Large bowel is nondilated. There are several areas of wall thickening along the colon. There is increasing stranding identified and some trace ascites now seen. Please correlate for colitis. Stomach is distended with oral contrast. Enteric tube in place along the stomach. Small bowel is nondilated. No free intra-air identified at this time. Vascular/Lymphatic: Grossly  normal caliber aorta and IVC with mild atherosclerotic calcifications. Right femoral line in place extending to the right common iliac vein. No definite abnormal lymph node enlargement present in the abdomen and pelvis. A few small less than 1 cm in size in short axis nodes are seen, not pathologic by size criteria. Reproductive: Status post hysterectomy. No adnexal masses. Other: Scattered skin thickening along the anterior abdomen and pelvic wall with subcutaneous fat stranding anasarca which is increasing. There is some developing mild ascites in the pelvis and diffuse mesenteric stranding. Musculoskeletal: Mild curvature and degenerative changes along the spine. IMPRESSION: Numerous tubes and lines as above. In particular the ET tube has a tip extending to the right main bronchus. Recommend this be retracted 3 cm. Persistent areas of wall thickening along the colon diffusely with increasing stranding and trace ascites in the abdomen. Please correlate for colitis. No obstruction or free air. Interval development compared to the prior CT scan of patchy bilateral ill-defined lung opacities diffusely distributed. Slightly more left than right overall. Please correlate for infectious or inflammatory process including an atypical or viral process in the differential. Nonobstructing renal stones. Persistent patulous esophagus with some contrast in the lumen of the esophagus underdistended stomach. Please correlate for any evidence of gastroesophageal reflux. Critical Value/emergent results were called by telephone at the time of interpretation on 03/15/2022 at 4:26 pm to provider Dr. Vaughan Browner, Who verbally acknowledged these results. Electronically Signed   By: Jill Side M.D.   On: 03/15/2022 16:48   DG Abd  1 View  Result Date: 03/15/2022 CLINICAL DATA:  Encounter for OG tube placement EXAM: ABDOMEN - 1 VIEW COMPARISON:  None Available. FINDINGS: The feeding tube terminates in the region of the distal gastric body.  An ETT terminates 1.1 cm above the carina. No other acute abnormalities. IMPRESSION: 1. The feeding tube terminates in the region of the distal gastric body. 2. The ETT terminates 1.1 cm above the carina. Recommend withdrawing 1 cm. These results will be called to the ordering clinician or representative by the Radiologist Assistant, and communication documented in the PACS or [employer]. Electronically Signed   By: Dorise Bullion III M.D.   On: 03/15/2022 15:00   ECHO TEE  Result Date: 03/15/2022    TRANSESOPHOGEAL ECHO REPORT   Patient Name:   GILBERTA PEETERS Date of Exam: 03/15/2022 Medical Rec #:  [id number]        Height:       58.0 in Accession #:    [id number]       Weight:       181.9 lb Date of Birth:  [birth year]       BSA:          1.749 m Patient Age:    70 years         BP:           121/83 mmHg Patient Gender: F                HR:           116 bpm. Exam Location:  Inpatient Procedure: Transesophageal Echo, Cardiac Doppler and Color Doppler Indications:     Bacteremia  History:         Patient has no prior history of Echocardiogram examinations.                  Risk Factors:Hypertension, Diabetes, Dyslipidemia, Current                  Smoker and Sleep Apnea. Septic shock, AKI.  Sonographer:     Clayton Lefort RDCS (AE) Referring Phys:  [id number] Donita Brooks Diagnosing Phys: Candee Furbish MD  Sonographer Comments: Bedside TEE at [facility]. PROCEDURE: After discussion of the risks and benefits of a TEE, an informed consent was obtained from the patient. The transesophogeal probe was passed without difficulty through the esophogus of the patient. Sedation performed by different physician. Image quality was good. The patient's vital signs; including heart rate, blood pressure, and oxygen saturation; remained stable throughout the procedure. The patient developed no complications during the procedure.  IMPRESSIONS  1. Left ventricular ejection fraction, by estimation, is 60 to 65%. The left ventricle  has normal function. The left ventricle has no regional wall motion abnormalities.  2. Right ventricular systolic function is normal. The right ventricular size is normal.  3. No left atrial/left atrial appendage thrombus was detected.  4. The mitral valve is normal in structure. Trivial mitral valve regurgitation. No evidence of mitral stenosis.  5. The aortic valve is tricuspid. Aortic valve regurgitation is not visualized. No aortic stenosis is present.  6. The inferior vena cava is normal in size with greater than 50% respiratory variability, suggesting right atrial pressure of 3 mmHg. Conclusion(s)/Recommendation(s): Normal biventricular function without evidence of hemodynamically significant valvular heart disease. No evidence of vegetation/infective endocarditis on this transesophageael echocardiogram. FINDINGS  Left Ventricle: Left ventricular ejection fraction, by estimation, is 60 to 65%. The left ventricle has normal function. The left  ventricle has no regional wall motion abnormalities. The left ventricular internal cavity size was normal in size. There is  no left ventricular hypertrophy. Right Ventricle: The right ventricular size is normal. No increase in right ventricular wall thickness. Right ventricular systolic function is normal. Left Atrium: Left atrial size was normal in size. No left atrial/left atrial appendage thrombus was detected. Right Atrium: Right atrial size was normal in size. Pericardium: There is no evidence of pericardial effusion. Mitral Valve: The mitral valve is normal in structure. Trivial mitral valve regurgitation. No evidence of mitral valve stenosis. Tricuspid Valve: The tricuspid valve is normal in structure. Tricuspid valve regurgitation is mild . No evidence of tricuspid stenosis. Aortic Valve: The aortic valve is tricuspid. Aortic valve regurgitation is not visualized. No aortic stenosis is present. Pulmonic Valve: The pulmonic valve was normal in structure. Pulmonic  valve regurgitation is not visualized. No evidence of pulmonic stenosis. Aorta: The aortic root is normal in size and structure. Venous: The inferior vena cava is normal in size with greater than 50% respiratory variability, suggesting right atrial pressure of 3 mmHg. IAS/Shunts: No atrial level shunt detected by color flow Doppler. Candee Furbish MD Electronically signed by Candee Furbish MD Signature Date/Time: 03/15/2022/1:49:53 PM    Final    DG CHEST PORT 1 VIEW  Result Date: 03/15/2022 CLINICAL DATA:  Acute respiratory failure with hypoxia. EXAM: PORTABLE CHEST 1 VIEW COMPARISON:  March 14, 2022. FINDINGS: The heart size and mediastinal contours are within normal limits. Endotracheal and feeding tubes are in grossly good position. Left internal jugular catheter is unchanged. Stable bibasilar atelectasis or infiltrates are noted. The visualized skeletal structures are unremarkable. IMPRESSION: Stable support apparatus.  Stable bibasilar opacities. Electronically Signed   By: Marijo Conception M.D.   On: 03/15/2022 08:08   DG CHEST PORT 1 VIEW  Result Date: 03/14/2022 CLINICAL DATA:  57 year old female with respiratory failure. Sepsis. EXAM: PORTABLE CHEST 1 VIEW COMPARISON:  Portable chest 03/13/2022 and earlier. FINDINGS: Portable AP semi upright view at 0440 hours. The patient remains rotated to the right. Stable endotracheal tube, left IJ central line. Enteric tube loops in the stomach and retained small volume gastric barium is unchanged. Stable lung volumes and mediastinal contours. Increased right greater than left pulmonary reticular interstitial opacity has mildly improved since 03/11/2022. No pneumothorax, pleural effusion or area of worsening ventilation. Paucity of bowel gas in the visible abdomen. IMPRESSION: 1. Stable lines and tubes. 2. Right greater than left pulmonary interstitial opacity has mildly improved since 03/11/2022. No new cardiopulmonary abnormality. Electronically Signed   By: Genevie Ann  M.D.   On: 03/14/2022 06:38   DG CHEST PORT 1 VIEW  Result Date: 03/13/2022 CLINICAL DATA:  Acute respiratory failure with hypoxia. EXAM: PORTABLE CHEST 1 VIEW COMPARISON:  March 11, 2022. FINDINGS: Stable cardiomediastinal silhouette. Stable bilateral lung opacities are noted concerning for multifocal pneumonia. Endotracheal and feeding tubes are unchanged. Left internal jugular catheter is unchanged. Bony thorax is unremarkable. IMPRESSION: Grossly stable support apparatus.  Stable bilateral lung opacities. Electronically Signed   By: Marijo Conception M.D.   On: 03/13/2022 10:19   DG CHEST PORT 1 VIEW  Result Date: 03/11/2022 CLINICAL DATA:  Status post intubation EXAM: PORTABLE CHEST 1 VIEW COMPARISON:  Film from earlier in the same day. FINDINGS: Cardiac shadow is enlarged but stable. Feeding catheter and left jugular central line are again noted and stable. Endotracheal tube is seen 2 cm above the carina. Lungs are well aerated bilaterally.  Patchy airspace opacities are seen bilaterally right greater than left. IMPRESSION: Endotracheal tube in satisfactory position. Stable patchy airspace opacities bilaterally. Electronically Signed   By: Inez Catalina M.D.   On: 03/11/2022 23:58   DG Chest Port 1 View  Result Date: 03/11/2022 CLINICAL DATA:  Hypoxia EXAM: PORTABLE CHEST 1 VIEW COMPARISON:  Film from earlier in the same day. FINDINGS: Cardiac shadow is enlarged. Feeding catheter and left jugular central line are again seen and stable. Patchy opacities are noted in the bases bilaterally as well as the right mid lung new from the prior exam. No bony abnormality is noted. IMPRESSION: New bilateral airspace opacities consistent with multifocal pneumonia. Electronically Signed   By: Inez Catalina M.D.   On: 03/11/2022 21:16   DG CHEST PORT 1 VIEW  Result Date: 03/11/2022 CLINICAL DATA:  Sepsis. EXAM: PORTABLE CHEST 1 VIEW COMPARISON:  03/09/2022 FINDINGS: There is a feeding tube which is looped within the  proximal stomach. Left IJ catheter tip is identified at the superior cavoatrial junction. Stable cardiac enlargement. Progressive decreased lung volumes with increased bibasilar atelectasis. Pulmonary vascular congestion. IMPRESSION: 1. Progressive decreased lung volumes with increased bibasilar atelectasis. 2. Pulmonary vascular congestion. Electronically Signed   By: Kerby Moors M.D.   On: 03/11/2022 11:44   DG Abd 1 View  Result Date: 03/10/2022 CLINICAL DATA:  NG tube placement EXAM: ABDOMEN - 1 VIEW COMPARISON:  03/09/2022 FINDINGS: Limited radiograph of the lower chest and upper abdomen was obtained for the purposes of enteric tube localization. Enteric tube is seen coursing below the diaphragm with distal tip coiled within the gastric fundus. Gaseous distension of the transverse colon is similar to the previous CT. IMPRESSION: Enteric tube coiled within the gastric fundus. Electronically Signed   By: Davina Poke D.O.   On: 03/10/2022 10:11   DG Chest Port 1 View  Result Date: 03/09/2022 CLINICAL DATA:  Placement of central venous catheter EXAM: PORTABLE CHEST 1 VIEW COMPARISON:  Previous studies including the examination done earlier today FINDINGS: Transverse diameter of heart is increased. Central pulmonary vessels are less prominent. There is interval decrease in interstitial markings in parahilar regions and lower lung fields. Small linear densities are seen in both lower lung fields suggesting subsegmental atelectasis. There is blunting of left lateral CP angle. There is no pneumothorax. There is interval placement of central venous catheter through the left IJ with its tip in superior vena cava close to the right atrium. IMPRESSION: Tip of left IJ central venous catheter is seen in superior vena cava close to right atrium. There is no pneumothorax. There is interval decrease in interstitial markings in both lungs suggesting resolving pulmonary edema. Minimal subsegmental atelectasis is  seen in both lower lung fields. Electronically Signed   By: Elmer Picker M.D.   On: 03/09/2022 21:43   CT CHEST ABDOMEN PELVIS WO CONTRAST  Result Date: 03/09/2022 CLINICAL DATA:  57 year old female with possible sepsis. Woke up with hives. Chest, abdominal and pelvic discomfort. EXAM: CT CHEST, ABDOMEN AND PELVIS WITHOUT CONTRAST TECHNIQUE: Multidetector CT imaging of the chest, abdomen and pelvis was performed following the standard protocol without IV contrast. RADIATION DOSE REDUCTION: This exam was performed according to the departmental dose-optimization program which includes automated exposure control, adjustment of the mA and/or kV according to patient size and/or use of iterative reconstruction technique. COMPARISON:  05/31/2009 abdominal and pelvic CT FINDINGS: Please note that parenchymal and vascular abnormalities may be missed as intravenous contrast was not administered. CT CHEST FINDINGS  Cardiovascular: Heart size is within normal limits. Aortic atherosclerotic calcifications are noted. There is no evidence of thoracic aortic aneurysm or pericardial effusion. Mediastinum/Nodes: Fluid within the esophagus is noted and may represent reflux or nonspecific esophageal motility disorder. There is no evidence of mediastinal mass or enlarged lymph nodes. The visualized thyroid and trachea are unremarkable. Lungs/Pleura: There is no evidence of airspace disease, consolidation, mass, suspicious nodule, pleural effusion or pneumothorax. Scattered subsegmental atelectasis/scarring within both lungs noted. Musculoskeletal: No acute or suspicious bony abnormalities are noted. CT ABDOMEN PELVIS FINDINGS Hepatobiliary: The liver is unremarkable. The patient is status post cholecystectomy. There is no evidence of intrahepatic or extrahepatic biliary dilatation. Pancreas: Unremarkable Spleen: Unremarkable Adrenals/Urinary Tract: The kidneys, adrenal glands and bladder are unremarkable except for punctate  nonobstructing bilateral renal calculi. Stomach/Bowel: Fluid throughout the stomach, small bowel and colon noted, nonspecific but may represent a diarrheal state. No definite bowel obstruction, definite bowel wall thickening or pneumoperitoneum. The appendix is normal. Vascular/Lymphatic: Aortic atherosclerosis. No enlarged abdominal or pelvic lymph nodes. Reproductive: No definite abnormality Other: A trace amount of free pelvic fluid is nonspecific. Musculoskeletal: No acute or suspicious bony abnormalities are noted. IMPRESSION: 1. Fluid throughout the stomach, small bowel and colon, nonspecific but may represent a diarrheal state. No bowel obstruction, definite bowel wall thickening or pneumoperitoneum. 2. Fluid within the esophagus which may represent reflux or nonspecific esophageal motility disorder. 3. Trace amount of free pelvic fluid, nonspecific. 4. Punctate nonobstructing bilateral renal calculi. 5.  Aortic Atherosclerosis (ICD10-I70.0). Electronically Signed   By: Margarette Canada M.D.   On: 03/09/2022 17:14   DG Chest Port 1 View  Result Date: 03/09/2022 CLINICAL DATA:  Hypoxia EXAM: PORTABLE CHEST 1 VIEW COMPARISON:  04/08/2018 FINDINGS: Transverse diameter of heart is increased. There is poor inspiration. Central pulmonary vessels are more prominent. Increased interstitial markings are seen in parahilar regions and lower lung fields. There are small linear densities in left lower lung field. There is possible minimal blunting of lateral CP angles. There is no pneumothorax. Deformity in the lateral end of right clavicle may be residual from previous injury. IMPRESSION: Cardiomegaly. Increased interstitial markings are seen in parahilar regions and lower lung fields suggesting possible mild interstitial edema. There are linear densities in left lower lung fields suggesting subsegmental atelectasis. Possible minimal bilateral pleural effusions. Electronically Signed   By: Elmer Picker M.D.   On:  03/09/2022 14:37    Labs:  Recent Labs    03/26/22 0438 03/27/22 0311 03/28/22 0530  WBC 16.3* 17.2* 18.2*  HGB 8.0* 7.5* 6.0*  HCT 23.4* 22.6* 17.4*  PLT 494* 465* 373   Recent Labs    03/26/22 0438 03/27/22 0311 03/28/22 0530  NA 140 140 136  K 4.3 4.3 4.2  CL 106 105 104  CO2 22 23 20*  GLUCOSE 159* 123* 116*  BUN 62* 73* 74*  CREATININE 3.58* 4.15* 4.17*  CALCIUM 8.3* 9.0 8.7*   Recent Labs    03/26/22 0734 03/27/22 0311 03/28/22 0530  PROT 5.6*  --   --   ALBUMIN 2.0*   < > 1.9*  AST 24  --   --   ALT 27  --   --   ALKPHOS 94  --   --   BILITOT 0.4  --   --   BILIDIR <0.1  --   --   IBILI NOT CALCULATED  --   --    < > = values in this interval not displayed.  No results for input(s): "HEPBSAG", "HCVAB", "HEPAIGM", "HEPBIGM" in the last 72 hours. No results for input(s): "LABPROT", "INR" in the last 72 hours.  Past Medical History:  Diagnosis Date   Allergy    Anxiety    Bursitis of both hips    Depression    Frequent headaches    Obesity    Sleep apnea    doesn't use CPAP machine broken,     Past Surgical History:  Procedure Laterality Date   CESAREAN SECTION     x2   CHOLECYSTECTOMY     COLONOSCOPY WITH PROPOFOL N/A 11/20/2017   Procedure: COLONOSCOPY WITH PROPOFOL;  Surgeon: Lin Landsman, MD;  Location: [facility] ENDOSCOPY;  Service: Gastroenterology;  Laterality: N/A;   HYSTERECTOMY ABDOMINAL WITH SALPINGECTOMY  1998   HYSTEROSCOPY      Family History  Problem Relation Age of Onset   Hodgkin's lymphoma Mother    Heart failure Maternal Grandmother    Alzheimer's disease Maternal Grandfather    Emphysema Paternal Grandmother    Diabetes Maternal Aunt    Diabetes Maternal Uncle    Cancer Other    Breast cancer Neg Hx     Prior to Admission medications   Medication Sig Start Date End Date Taking? Authorizing Provider  amLODipine (NORVASC) 10 MG tablet Take 1 tablet (10 mg total) by mouth daily. 07/04/21  Yes Karamalegos,  Alexander J, DO  BYDUREON BCISE 2 MG/0.85ML AUIJ INJECT 2 MG INTO SKIN EVERY WEEK Patient taking differently: Inject 2 mg into the skin once a week. 09/07/21  Yes Karamalegos, Devonne Doughty, DO  estradiol (ESTRACE) 1 MG tablet Take 1 tablet (1 mg total) by mouth daily. 17/61/60  Yes Copland, Elmo Putt B, PA-C  fluticasone (FLONASE) 50 MCG/ACT nasal spray 1 SPARY IN EACH NOSTRIL DAILY Patient taking differently: Place 1 spray into both nostrils daily. 10/08/21  Yes Karamalegos, Devonne Doughty, DO  Galcanezumab-gnlm (EMGALITY) 120 MG/ML SOAJ Inject 120 mg into the skin every 30 (thirty) days.   Yes [provider]  meloxicam (MOBIC) 7.5 MG tablet Take 7.5 mg by mouth daily. 10/15/21  Yes [provider]  nortriptyline (PAMELOR) 25 MG capsule Take 25 mg by mouth See admin instructions. Take 25 mg (1 capsule) by mouth every morning, and 50 mg (2 capsules) every night at bedtime. 10/24/20  Yes [provider]  omeprazole (PRILOSEC) 40 MG capsule Take 40 mg by mouth 2 (two) times daily. 02/13/22  Yes [provider]  pregabalin (LYRICA) 150 MG capsule Take 150 mg by mouth 3 (three) times daily. 06/05/21  Yes [provider]  promethazine (PHENERGAN) 25 MG tablet Take 1 tablet (25 mg total) by mouth every 6 (six) hours as needed for nausea or vomiting. 09/03/21  Yes Karamalegos, Devonne Doughty, DO  QUEtiapine (SEROQUEL) 300 MG tablet Take 300 mg by mouth at bedtime. 06/04/21  Yes [provider]  sucralfate (CARAFATE) 1 g tablet TAKE 1 TABLET FOUR TIMES A DAY WITH MEALS AND AT BEDTIME AS NEEDED ONLY Patient taking differently: Take 1 g by mouth 4 (four) times daily. 06/04/21  Yes Baity, Coralie Keens, NP  tiotropium (SPIRIVA HANDIHALER) 18 MCG inhalation capsule INHALE CONTENTS OF ONE CAPSULE ONCE DAILY USING HANDIHALER DEVICE Patient taking differently: Place 18 mcg into inhaler and inhale daily. 10/08/21  Yes Karamalegos, Devonne Doughty, DO  valACYclovir (VALTREX) 1000 MG tablet  TAKE 1 TABLET BY MOUTH DAILY Patient taking differently: Take 1,000 mg by mouth daily. 07/04/21  Yes Olin Hauser, DO  Vilazodone HCl (VIIBRYD) 40 MG TABS Take 40 mg by mouth daily. 10/24/21  Yes [provider]  albuterol (VENTOLIN HFA) 108 (90 Base) MCG/ACT inhaler INHALE 2 PUFFS EVERY FOUR HOURS AS NEEDED Patient taking differently: Inhale 2 puffs into the lungs every 4 (four) hours as needed for shortness of breath or wheezing. 10/08/21   Parks Ranger, Devonne Doughty, DO  aspirin EC 81 MG tablet Take 81 mg by mouth daily. 09/25/21 09/25/22  [provider]  gabapentin (NEURONTIN) 300 MG capsule Take 1 capsule (300 mg total) by mouth 3 (three) times daily. Patient not taking: Reported on 03/12/2022 12/18/21 50/3/54  Copland, Deirdre Evener, PA-C  pantoprazole (PROTONIX) 40 MG tablet Take 1 tablet (40 mg total) by mouth 2 (two) times daily before a meal. Patient not taking: Reported on 03/12/2022 09/03/21   Olin Hauser, DO  SYMBICORT 160-4.5 MCG/ACT inhaler INHALE 2 PUFFS [place] EVENING Patient taking differently: Inhale 2 puffs into the lungs every evening. 10/18/21   Olin Hauser, DO    Current Facility-Administered Medications  Medication Dose Route Frequency Provider Last Rate Last Admin   0.9 %  sodium chloride infusion (Manually program via Guardrails IV Fluids)   Intravenous Once Little Ishikawa, MD       0.9 %  sodium chloride infusion  250 mL Intravenous Continuous Rigoberto Noel, MD   Stopped at 03/17/22 0701   0.9 %  sodium chloride infusion   Intra-arterial PRN Mannam, Praveen, MD       0.9 %  sodium chloride infusion   Intravenous PRN Maryjane Hurter, MD   Stopped at 03/22/22 1151   0.9 %  sodium chloride infusion   Intravenous Continuous Roney Jaffe, MD 45 mL/hr at 03/28/22 0122 Infusion Verify at 03/28/22 0122   acetaminophen (TYLENOL) 160 MG/5ML solution 650 mg  650 mg Per Tube Q6H PRN Anders Simmonds, MD   650 mg  at 03/27/22 2240   acetaminophen (TYLENOL) tablet 325 mg  325 mg Per NG tube Once PRN Cecilie Lowers T, MD       albuterol (PROVENTIL) (2.5 MG/3ML) 0.083% nebulizer solution 2.5 mg  2.5 mg Nebulization Q4H PRN Rigoberto Noel, MD       amLODipine (NORVASC) tablet 10 mg  10 mg Per Tube Daily Mannam, Praveen, MD   10 mg at 03/28/22 0853   arformoterol (BROVANA) nebulizer solution 15 mcg  15 mcg Nebulization BID Kara Mead V, MD   15 mcg at 03/28/22 0830   budesonide (PULMICORT) nebulizer solution 0.5 mg  0.5 mg Nebulization BID Rigoberto Noel, MD   0.5 mg at 03/28/22 0830   Chlorhexidine Gluconate Cloth 2 % PADS 6 each  6 each Topical Daily Juanito Doom, MD   6 each at 03/28/22 0854   Darbepoetin Alfa (ARANESP) injection 40 mcg  40 mcg Subcutaneous Q Fri-1800 Iona Beard, MD   40 mcg at 03/22/22 1821   docusate (COLACE) 50 MG/5ML liquid 100 mg  100 mg Per Tube BID PRN Hunsucker, Bonna Gains, MD       feeding supplement (NEPRO CARB STEADY) liquid  40 mL/hr Per Tube Continuous Roney Jaffe, MD 40 mL/hr at 03/27/22 0022 40 mL/hr at 03/27/22 0022   fiber supplement (BANATROL TF) liquid 60 mL  60 mL Per Tube BID Mannam, Praveen, MD   60 mL at 03/28/22 0853   haloperidol lactate (HALDOL) injection 2 mg  2 mg Intravenous Q6H PRN Icard, Bradley L, DO  2 mg at 03/24/22 0824   insulin aspart (novoLOG) injection 0-15 Units  0-15 Units Subcutaneous Q4H Collene Gobble, MD   2 Units at 03/28/22 0854   insulin aspart (novoLOG) injection 3 Units  3 Units Subcutaneous Q4H Collene Gobble, MD   3 Units at 03/28/22 0854   labetalol (NORMODYNE) injection 5-10 mg  5-10 mg Intravenous Q2H PRN Etta Quill, DO   10 mg at 03/27/22 2053   lip balm (CARMEX) ointment   Topical PRN Collene Gobble, MD   75 Application at 40/97/35 1210   ondansetron (ZOFRAN) injection 4 mg  4 mg Intravenous Q6H PRN Rigoberto Noel, MD   4 mg at 03/21/22 2031   Oral care mouth rinse  15 mL Mouth Rinse 4 times per day Maryjane Hurter, MD   15 mL at 03/28/22 0855   [START ON 03/31/2022] pantoprazole (PROTONIX) injection 40 mg  40 mg Intravenous Q12H Little Ishikawa, MD       pantoprozole (PROTONIX) 80 mg /NS 100 mL infusion  8 mg/hr Intravenous Continuous Little Ishikawa, MD 10 mL/hr at 03/28/22 0859 8 mg/hr at 03/28/22 0859   polyethylene glycol (MIRALAX / GLYCOLAX) packet 17 g  17 g Per Tube Daily PRN Hunsucker, Bonna Gains, MD       QUEtiapine (SEROQUEL) tablet 50 mg  50 mg Per Tube BID Iona Beard, MD   50 mg at 03/28/22 3299   revefenacin (YUPELRI) nebulizer solution 175 mcg  175 mcg Nebulization Daily Rigoberto Noel, MD   175 mcg at 03/28/22 0830   sodium chloride flush (NS) 0.9 % injection 10-40 mL  10-40 mL Intracatheter Q12H Maryjane Hurter, MD   10 mL at 03/27/22 2109   sodium chloride flush (NS) 0.9 % injection 10-40 mL  10-40 mL Intracatheter PRN Maryjane Hurter, MD       Vilazodone HCl TABS 40 mg  40 mg Per Tube Daily Noe Gens L, NP   40 mg at 03/28/22 0853    Allergies as of 03/09/2022 - Review Complete 03/09/2022  Allergen Reaction Noted   [place] Other (See Comments) 11/30/2013   Tape Rash 01/31/2014    Social History   Socioeconomic History   Marital status: Divorced    Spouse name: Not on file   Number of children: Not on file   Years of education: High School   Highest education level: Not on file  Occupational History   Occupation: Unemployed  Tobacco Use   Smoking status: Every Day    Packs/day: 1.00    Years: 30.00    Total pack years: 30.00    Types: Cigarettes   Smokeless tobacco: Current  Vaping Use   Vaping Use: Never used  Substance and Sexual Activity   Alcohol use: Yes    Comment: occ once per year   Drug use: Yes    Types: Marijuana    Comment: Current use   Sexual activity: Not Currently  Other Topics Concern   Not on file  Social History Narrative   Not on file   Social Determinants of Health   Financial Resource Strain: Not on file   Food Insecurity: Not on file  Transportation Needs: Not on file  Physical Activity: Not on file  Stress: Not on file  Social Connections: Not on file  Intimate Partner Violence: Not on file    Review of Systems: Unable to obtain.   Physical Exam: Vital signs in last 24 hours:  Temp:  [98.4 F (36.9 C)-100.4 F (38 C)] 98.8 F (37.1 C) (01/18 0906) Pulse Rate:  [106-121] 115 (01/18 0906) Resp:  [17-20] 17 (01/18 0906) BP: (133-178)/(86-97) 173/97 (01/18 0906) SpO2:  [93 %-97 %] 97 % (01/18 0906) FiO2 (%):  [21 %] 21 % (01/17 1949) Weight:  [77.6 kg] 77.6 kg (01/18 0500) Last BM Date : 03/27/22  General:  Alert female in NAD. In soft restraints.  Psych:  Pleasant, cooperative. t Eyes: Pupils equal Ears:  Normal auditory acuity Nose: No deformity, discharge or lesions Neck:  Supple, no masses felt Lungs:  Clear to auscultation.  Heart:  Regular rate, regular rhythm.  Abdomen:  Soft, obese, mild generalized tenderness. Active bowel sounds, Rectal :  Deferred, has rectal pouch just placed a few minutes ago Msk: Symmetrical without gross deformities.  Neurologic:  Alert, oriented, grossly normal neurologically Extremities : No edema Skin:  Intact without significant lesions.    Intake/Output from previous day: 01/17 0701 - 01/18 0700 In: 551.1 [I.V.:451.1; NG/GT:100] Out: 2410 [Urine:1575; Stool:835] Intake/Output this shift:  No intake/output data recorded.  Principal Problem:   Septic shock ([place]) Active Problems:   AKI (acute kidney injury) ([place])   Bacteremia   Rash   Acute respiratory failure with hypoxia ([place])   Acute encephalopathy    Tye Savoy, NP-C @  03/28/2022, 10:01 AM

## 2022-03-28 NOTE — Progress Notes (Signed)
Physical Therapy Treatment Patient Details Name: Darlene Barber MRN: [id number] DOB: June 05, 1965 Today's Date: 03/28/2022   History of Present Illness 57 yo female admitted 12/30 to [facility] with abdominal pain and rash with sepsis and AKI. Respiratory failure intubated 1/2-1/11. CRRT (1/3-1/8, 1/10-1/11). pt also with metabolic encephalopathy acutely. PMhx: chronic pain, GERD, bipolar, tobacco and THC abuse, insomnia, HTN, tremor    PT Comments    Patient initially resistant to participate in therapy, but with boyfriend's encouragement she participated. She requires incr time for all command following and occasionally forgets what she is supposed to be doing (internally distracted), requiring multi-modal cues.  Able to sit EOB with +2 assist moving to/from EOB. Progressed to sitting with minguard assist with feet unsupported and no UE support.    Recommendations for follow up therapy are one component of a multi-disciplinary discharge planning process, led by the attending physician.  Recommendations may be updated based on patient status, additional functional criteria and insurance authorization.  Follow Up Recommendations  Skilled nursing-short term rehab (<3 hours/day) Can patient physically be transported by private vehicle: No   Assistance Recommended at Discharge Frequent or constant Supervision/Assistance  Patient can return home with the following  (cannot return home to 2nd floor apartment)   Equipment Recommendations  Rolling walker (2 wheels)    Recommendations for Other Services       Precautions / Restrictions Precautions Precautions: Fall;Other (comment) Precaution Comments: rectal pouch, foley, cortrak Restrictions Weight Bearing Restrictions: No     Mobility  Bed Mobility Overal bed mobility: Needs Assistance Bed Mobility: Rolling, Sidelying to Sit, Sit to Supine Rolling: Min assist, Max assist Sidelying to sit: Max assist   Sit to supine: Max assist, +2 for  physical assistance   General bed mobility comments: Pt requiring assist for all aspects of bed mobility initially as pt with incr anxiety with all movement. Initially max A +2 to roll progressing to min A to roll L and R to adjust placement of bed pad at end of session. Assist to bring BLE off of bed and mod A to elevate trunk. Variable performance based on pt comfort level and increased rapport with therapists    Transfers                   General transfer comment: deferred    Ambulation/Gait                   Stairs             Wheelchair Mobility    Modified Rankin (Stroke Patients Only)       Balance Overall balance assessment: Needs assistance Sitting-balance support: No upper extremity supported, Feet unsupported Sitting balance-Leahy Scale: Fair Sitting balance - Comments: Initially min A EOB and progressing to min guard A feet not supported                                    Cognition Arousal/Alertness: Lethargic Behavior During Therapy: Flat affect Overall Cognitive Status: No family/caregiver present to determine baseline cognitive functioning                                 General Comments: Pt requruing encouragement to participate in mobility this seession. Following commands with increased time, light encouragement, and multimodal cues. Observed to be anxious with all movement and easy  to startle. Slow processing noted throughout        Exercises      General Comments General comments (skin integrity, edema, etc.): HR 126 max at EOB. Into 110s in supine. BP 120s/70s in supine, 141/83 in sitting.      Pertinent Vitals/Pain Pain Assessment Pain Assessment: Faces Faces Pain Scale: Hurts little more Pain Location: head; back; appeared to be sore to touch of R foot Pain Descriptors / Indicators: Sore, Headache Pain Intervention(s): Limited activity within patient's tolerance, Monitored during session,  Repositioned    Home Living Family/patient expects to be discharged to:: Private residence Living Arrangements: Spouse/significant other;Children (boyfriend and son) Available Help at Discharge: Family;Available 24 hours/day Type of Home: Apartment Home Access: Stairs to enter Entrance Stairs-Rails: Right;Left;Can reach both Entrance Stairs-Number of Steps: 2 flights   Home Layout: One level Home Equipment: Grab bars - tub/shower;Cane - quad Additional Comments: information provided by boyfriend    Prior Function            PT Goals (current goals can now be found in the care plan section) Acute Rehab PT Goals Patient Stated Goal: unable to state; boyfriend wants her to get well and able to return home Time For Goal Achievement: 04/10/22 Potential to Achieve Goals: Good Progress towards PT goals: Progressing toward goals    Frequency    Min 2X/week      PT Plan Current plan remains appropriate    Co-evaluation PT/OT/SLP Co-Evaluation/Treatment: Yes Reason for Co-Treatment: Complexity of the patient's impairments (multi-system involvement);For patient/therapist safety PT goals addressed during session: Mobility/safety with mobility;Balance        AM-PAC PT "6 Clicks" Mobility   Outcome Measure  Help needed turning from your back to your side while in a flat bed without using bedrails?: A Lot Help needed moving from lying on your back to sitting on the side of a flat bed without using bedrails?: Total Help needed moving to and from a bed to a chair (including a wheelchair)?: Total Help needed standing up from a chair using your arms (e.g., wheelchair or bedside chair)?: Total Help needed to walk in hospital room?: Total Help needed climbing 3-5 steps with a railing? : Total 6 Click Score: 7    End of Session   Activity Tolerance: Patient limited by fatigue Patient left: in bed;with call bell/phone within reach;with bed alarm set;with restraints reapplied;with  nursing/sitter in room (left mit--RN to apply)   PT Visit Diagnosis: Muscle weakness (generalized) (M62.81);Difficulty in walking, not elsewhere classified (R26.2)     Time: 9509-3267 PT Time Calculation (min) (ACUTE ONLY): 27 min  Charges:  $Therapeutic Activity: 8-22 mins                      Arby Barrette, PT Acute Rehabilitation Services  Office [phone]    Rexanne Mano 03/28/2022, 3:11 PM

## 2022-03-28 NOTE — Evaluation (Signed)
Occupational Therapy Evaluation Patient Details Name: Darlene Barber MRN: [id number] DOB: 01/03/1966 Today's Date: 03/28/2022   History of Present Illness 57 yo female admitted 12/30 to [facility] with abdominal pain and rash with sepsis and AKI. Respiratory failure intubated 1/2-1/11. CRRT (1/3-1/8, 1/10-1/11). pt also with metabolic encephalopathy acutely. PMhx: chronic pain, GERD, bipolar, tobacco and THC abuse, insomnia, HTN, tremor   Clinical Impression   Pt presenting with problem above with deficits listed below. Upon eval, pt presents with general malaise, decreased strength, balance, cognition, and activity tolerance. Pt requiring encouragement for participation this session due to unwell feeling and decreased knowledge of role/importance of mobility in healing. Pt performing bed mobility with min-max A +2 due to fluctuating level of motivation. Pt observed with anxiety and easy to startle to noises and touch this session. Recommending SNF for continued OT services to optimize safety and independence in ADL and IADL.      Recommendations for follow up therapy are one component of a multi-disciplinary discharge planning process, led by the attending physician.  Recommendations may be updated based on patient status, additional functional criteria and insurance authorization.   Follow Up Recommendations  Skilled nursing-short term rehab (<3 hours/day)     Assistance Recommended at Discharge Frequent or constant Supervision/Assistance  Patient can return home with the following Two people to help with walking and/or transfers;Two people to help with bathing/dressing/bathroom;Assistance with cooking/housework;Assistance with feeding;Direct supervision/assist for medications management;Direct supervision/assist for financial management;Assist for transportation;Help with stairs or ramp for entrance    Functional Status Assessment  Patient has had a recent decline in their functional status  and demonstrates the ability to make significant improvements in function in a reasonable and predictable amount of time.  Equipment Recommendations  Other (comment) (defer)    Recommendations for Other Services       Precautions / Restrictions Precautions Precautions: Fall;Other (comment) Precaution Comments: rectal tube, foley, cortrak Restrictions Weight Bearing Restrictions: No      Mobility Bed Mobility Overal bed mobility: Needs Assistance Bed Mobility: Rolling, Sidelying to Sit, Sit to Supine Rolling: Min assist, Max assist Sidelying to sit: Max assist   Sit to supine: Max assist, +2 for physical assistance   General bed mobility comments: Pt requiring assist for all aspects of bed mobility initially as pt with incr anxiety with all movement. Initially max A +2 to roll progressing to min A to roll L and R to adjust placement of bed pad at end of session. Assist to bring BLE off of bed and mod A to elevate trunk. Variable performance based on pt comfort level and increased rapport with therapists    Transfers                   General transfer comment: deferred      Balance Overall balance assessment: Needs assistance Sitting-balance support: No upper extremity supported, Feet unsupported Sitting balance-Leahy Scale: Fair Sitting balance - Comments: Initially min A EOB and progressing to min guard A feet not supported                                   ADL either performed or assessed with clinical judgement   ADL Overall ADL's : Needs assistance/impaired     Grooming: Moderate assistance Grooming Details (indicate cue type and reason): pt refusing to wash face or perform any ADL. Observed with decresaed grip strength so likely up to mod A  to perform bil tasks. Able to wash face with set-up at bed level as pt able to scratch nose and touch head Upper Body Bathing: Moderate assistance;Sitting   Lower Body Bathing: Maximal  assistance;Sitting/lateral leans   Upper Body Dressing : Moderate assistance;Sitting   Lower Body Dressing: Maximal assistance;Sitting/lateral leans     Toilet Transfer Details (indicate cue type and reason): deferred           General ADL Comments: Pt with fatige and general unwell feeling     Vision   Additional Comments: WFL for tasks assessed     Perception     Praxis      Pertinent Vitals/Pain Pain Assessment Pain Assessment: Faces Faces Pain Scale: Hurts little more Pain Location: head; back; appeared to be sore to touch of R foot Pain Descriptors / Indicators: Sore, Headache Pain Intervention(s): Limited activity within patient's tolerance, Monitored during session     Hand Dominance Right   Extremity/Trunk Assessment Upper Extremity Assessment Upper Extremity Assessment: Generalized weakness;RUE deficits/detail;LUE deficits/detail RUE Deficits / Details: generalized weakness; Pt with decresaed effort during furmal testing, but at least 3/5 strength. Able to use UE to participate in bed mobility. Reduced skin integrity with skin flaking in large sections. LUE Deficits / Details: generalized weakness; Pt with decresaed effort during furmal testing, but at least 3/5 strength. Able to use UE to participate in bed mobility.Reduced skin integrity with skin flaking in large sections. Reduced skin integrity with skin flaking in large sections. Observed with redness and scabbing in small line across distal MCPJ   Lower Extremity Assessment Lower Extremity Assessment: Defer to PT evaluation   Cervical / Trunk Assessment Cervical / Trunk Assessment: Other exceptions Cervical / Trunk Exceptions: overweight   Communication Communication Communication: Other (comment) (Very selective in attempts to communicate and with up to 3 word short sentences)   Cognition Arousal/Alertness: Lethargic Behavior During Therapy: Flat affect Overall Cognitive Status: Difficult to  assess                                 General Comments: Pt requruing encouragement to participate in mobility this seession. Following commands with increased time, light encouragement, and multimodal cues. Observed to be anxious with all movement and easy to startle. Slow processing noted throughout     General Comments  HR 126 max at EOB. Into 110s in supine    Exercises     Shoulder Instructions      Home Living Family/patient expects to be discharged to:: Private residence Living Arrangements: Spouse/significant other;Children (boyfriend and son) Available Help at Discharge: Family;Available 24 hours/day Type of Home: Apartment Home Access: Stairs to enter [employer] of Steps: 2 flights Entrance Stairs-Rails: Right;Left;Can reach both Home Layout: One level     Bathroom Shower/Tub: Tub/shower unit;Walk-in shower   Bathroom Toilet: Standard     Home Equipment: Grab bars - tub/shower;Cane - quad   Additional Comments: information provided by boyfriend      Prior Functioning/Environment Prior Level of Function : Independent/Modified Independent;History of Falls (last six months)             Mobility Comments: has one knee that gives out sometimes and has had 3 falls in past several months (per boyfriend); she refuses to use quad cane that was given to her ADLs Comments: Boyfriend not present this session, and pt not reporting        OT Problem List: Decreased strength;Decreased  activity tolerance;Impaired balance (sitting and/or standing);Decreased coordination;Decreased cognition;Decreased safety awareness;Decreased knowledge of use of DME or AE;Impaired UE functional use      OT Treatment/Interventions: Self-care/ADL training;Therapeutic exercise;DME and/or AE instruction;Therapeutic activities;Cognitive remediation/compensation;Patient/family education;Balance training    OT Goals(Current goals can be found in the care plan section)  Acute Rehab OT Goals Patient Stated Goal: rest OT Goal Formulation: With patient Time For Goal Achievement: 04/11/22 Potential to Achieve Goals: Good  OT Frequency: Min 2X/week    Co-evaluation              AM-PAC OT "6 Clicks" Daily Activity     Outcome Measure Help from another person eating meals?: A Lot Help from another person taking care of personal grooming?: A Lot Help from another person toileting, which includes using toliet, bedpan, or urinal?: Total Help from another person bathing (including washing, rinsing, drying)?: A Lot Help from another person to put on and taking off regular upper body clothing?: A Lot Help from another person to put on and taking off regular lower body clothing?: Total 6 Click Score: 10   End of Session Nurse Communication: Mobility status  Activity Tolerance: Patient tolerated treatment well;Patient limited by fatigue Patient left: in bed;with call bell/phone within reach;with bed alarm set;with nursing/sitter in room;with family/visitor present  OT Visit Diagnosis: History of falling (Z91.81);Muscle weakness (generalized) (M62.81);Repeated falls (R29.6);Other symptoms and signs involving cognitive function;Pain Pain - part of body:  (head, back)                Time: 1335-1403 OT Time Calculation (min): 28 min Charges:  OT General Charges $OT Visit: 1 Visit OT Evaluation $OT Eval Moderate Complexity: 1 Mod  Elder Cyphers, OTR/L [facility] Acute Rehabilitation Office: [phone]   Magnus Ivan 03/28/2022, 2:25 PM

## 2022-03-28 NOTE — Progress Notes (Signed)
[name]  body temp 101.0. Notified MD, will start blood transfusion when temp decreases. Administered tylenol.

## 2022-03-29 ENCOUNTER — Inpatient Hospital Stay (HOSPITAL_COMMUNITY): Payer: Medicaid Other

## 2022-03-29 DIAGNOSIS — D62 Acute posthemorrhagic anemia: Secondary | ICD-10-CM

## 2022-03-29 DIAGNOSIS — K922 Gastrointestinal hemorrhage, unspecified: Secondary | ICD-10-CM

## 2022-03-29 DIAGNOSIS — D500 Iron deficiency anemia secondary to blood loss (chronic): Secondary | ICD-10-CM | POA: Diagnosis not present

## 2022-03-29 DIAGNOSIS — A419 Sepsis, unspecified organism: Secondary | ICD-10-CM | POA: Diagnosis not present

## 2022-03-29 DIAGNOSIS — R6521 Severe sepsis with septic shock: Secondary | ICD-10-CM | POA: Diagnosis not present

## 2022-03-29 DIAGNOSIS — G9340 Encephalopathy, unspecified: Secondary | ICD-10-CM

## 2022-03-29 DIAGNOSIS — K529 Noninfective gastroenteritis and colitis, unspecified: Secondary | ICD-10-CM | POA: Diagnosis not present

## 2022-03-29 LAB — RENAL FUNCTION PANEL
Albumin: 1.9 g/dL — ABNORMAL LOW (ref 3.5–5.0)
Anion gap: 12 (ref 5–15)
BUN: 72 mg/dL — ABNORMAL HIGH (ref 6–20)
CO2: 17 mmol/L — ABNORMAL LOW (ref 22–32)
Calcium: 8.9 mg/dL (ref 8.9–10.3)
Chloride: 107 mmol/L (ref 98–111)
Creatinine, Ser: 4.19 mg/dL — ABNORMAL HIGH (ref 0.44–1.00)
GFR, Estimated: 12 mL/min — ABNORMAL LOW (ref >60)
Glucose, Bld: 157 mg/dL — ABNORMAL HIGH (ref 70–99)
Phosphorus: 6.8 mg/dL — ABNORMAL HIGH (ref 2.5–4.6)
Potassium: 4.6 mmol/L (ref 3.5–5.1)
Sodium: 136 mmol/L (ref 135–145)

## 2022-03-29 LAB — CBC WITH DIFFERENTIAL/PLATELET
Abs Immature Granulocytes: 0.3 10*3/uL — ABNORMAL HIGH (ref 0.00–0.07)
Basophils Absolute: 0 10*3/uL (ref 0.0–0.1)
Basophils Relative: 0 %
Eosinophils Absolute: 0 10*3/uL (ref 0.0–0.5)
Eosinophils Relative: 0 %
HCT: 19.7 % — ABNORMAL LOW (ref 36.0–46.0)
Hemoglobin: 6.8 g/dL — CL (ref 12.0–15.0)
Lymphocytes Relative: 14 %
Lymphs Abs: 1.9 10*3/uL (ref 0.7–4.0)
MCH: 30.4 pg (ref 26.0–34.0)
MCHC: 34.5 g/dL (ref 30.0–36.0)
MCV: 87.9 fL (ref 80.0–100.0)
Monocytes Absolute: 0.4 10*3/uL (ref 0.1–1.0)
Monocytes Relative: 3 %
Neutro Abs: 11.3 10*3/uL — ABNORMAL HIGH (ref 1.7–7.7)
Neutrophils Relative %: 81 %
Platelets: 224 10*3/uL (ref 150–400)
Promyelocytes Relative: 2 %
RBC: 2.24 MIL/uL — ABNORMAL LOW (ref 3.87–5.11)
RDW: 18.4 % — ABNORMAL HIGH (ref 11.5–15.5)
WBC: 13.9 10*3/uL — ABNORMAL HIGH (ref 4.0–10.5)
nRBC: 0 /100 WBC
nRBC: 0.8 % — ABNORMAL HIGH (ref 0.0–0.2)

## 2022-03-29 LAB — CBC
HCT: 20.7 % — ABNORMAL LOW (ref 36.0–46.0)
HCT: 24 % — ABNORMAL LOW (ref 36.0–46.0)
Hemoglobin: 7 g/dL — ABNORMAL LOW (ref 12.0–15.0)
Hemoglobin: 8.2 g/dL — ABNORMAL LOW (ref 12.0–15.0)
MCH: 29.9 pg (ref 26.0–34.0)
MCH: 29.7 pg (ref 26.0–34.0)
MCHC: 33.8 g/dL (ref 30.0–36.0)
MCHC: 34.2 g/dL (ref 30.0–36.0)
MCV: 88.5 fL (ref 80.0–100.0)
MCV: 87 fL (ref 80.0–100.0)
Platelets: 305 10*3/uL (ref 150–400)
Platelets: 248 10*3/uL (ref 150–400)
RBC: 2.34 MIL/uL — ABNORMAL LOW (ref 3.87–5.11)
RBC: 2.76 MIL/uL — ABNORMAL LOW (ref 3.87–5.11)
RDW: 18.5 % — ABNORMAL HIGH (ref 11.5–15.5)
RDW: 17.7 % — ABNORMAL HIGH (ref 11.5–15.5)
WBC: 19.9 10*3/uL — ABNORMAL HIGH (ref 4.0–10.5)
WBC: 16.4 10*3/uL — ABNORMAL HIGH (ref 4.0–10.5)
nRBC: 0.6 % — ABNORMAL HIGH (ref 0.0–0.2)
nRBC: 0.9 % — ABNORMAL HIGH (ref 0.0–0.2)

## 2022-03-29 LAB — PREPARE RBC (CROSSMATCH)

## 2022-03-29 LAB — GLUCOSE, CAPILLARY
Glucose-Capillary: 164 mg/dL — ABNORMAL HIGH (ref 70–99)
Glucose-Capillary: 124 mg/dL — ABNORMAL HIGH (ref 70–99)
Glucose-Capillary: 157 mg/dL — ABNORMAL HIGH (ref 70–99)
Glucose-Capillary: 142 mg/dL — ABNORMAL HIGH (ref 70–99)
Glucose-Capillary: 105 mg/dL — ABNORMAL HIGH (ref 70–99)
Glucose-Capillary: 117 mg/dL — ABNORMAL HIGH (ref 70–99)

## 2022-03-29 LAB — DIC (DISSEMINATED INTRAVASCULAR COAGULATION)PANEL
D-Dimer, Quant: 2.39 ug/mL-FEU — ABNORMAL HIGH (ref 0.00–0.50)
Fibrinogen: 401 mg/dL (ref 210–475)
INR: 1.3 — ABNORMAL HIGH (ref 0.8–1.2)
Platelets: 227 10*3/uL (ref 150–400)
Prothrombin Time: 16.3 seconds — ABNORMAL HIGH (ref 11.4–15.2)
Smear Review: NONE SEEN
aPTT: 27 seconds (ref 24–36)

## 2022-03-29 LAB — BASIC METABOLIC PANEL
Anion gap: 11 (ref 5–15)
BUN: 63 mg/dL — ABNORMAL HIGH (ref 6–20)
CO2: 14 mmol/L — ABNORMAL LOW (ref 22–32)
Calcium: 8.2 mg/dL — ABNORMAL LOW (ref 8.9–10.3)
Chloride: 109 mmol/L (ref 98–111)
Creatinine, Ser: 4 mg/dL — ABNORMAL HIGH (ref 0.44–1.00)
GFR, Estimated: 13 mL/min — ABNORMAL LOW (ref >60)
Glucose, Bld: 120 mg/dL — ABNORMAL HIGH (ref 70–99)
Potassium: 4.8 mmol/L (ref 3.5–5.1)
Sodium: 134 mmol/L — ABNORMAL LOW (ref 135–145)

## 2022-03-29 LAB — HEMOGLOBIN AND HEMATOCRIT, BLOOD
HCT: 21 % — ABNORMAL LOW (ref 36.0–46.0)
Hemoglobin: 7.4 g/dL — ABNORMAL LOW (ref 12.0–15.0)

## 2022-03-29 MED ORDER — DEXMEDETOMIDINE HCL IN NACL 400 MCG/100ML IV SOLN
0.0000 ug/kg/h | INTRAVENOUS | Status: DC
  Administered 2022-03-29 – 2022-03-30 (×2): 1.2 ug/kg/h via INTRAVENOUS
  Administered 2022-03-30 – 2022-03-31 (×3): 0.8 ug/kg/h via INTRAVENOUS
  Administered 2022-03-31: 0.6 ug/kg/h via INTRAVENOUS
  Administered 2022-03-31: 0.5 ug/kg/h via INTRAVENOUS
  Administered 2022-03-31: 0.9 ug/kg/h via INTRAVENOUS
  Administered 2022-04-01: 0.7 ug/kg/h via INTRAVENOUS
  Administered 2022-04-01: 1 ug/kg/h via INTRAVENOUS
  Administered 2022-04-01: 0.8 ug/kg/h via INTRAVENOUS
  Filled 2022-03-29 (×9): qty 100
  Filled 2022-03-29: qty 200

## 2022-03-29 MED ORDER — PEG-KCL-NACL-NASULF-NA ASC-C 100 G PO SOLR: 1.0000 | Freq: Once | ORAL | Status: DC

## 2022-03-29 MED ORDER — SODIUM CHLORIDE 0.9% IV SOLUTION: Freq: Once | INTRAVENOUS | Status: AC

## 2022-03-29 MED ORDER — PEG-KCL-NACL-NASULF-NA ASC-C 100 G PO SOLR
0.5000 | Freq: Once | ORAL | Status: AC
  Administered 2022-03-29: 100 g via ORAL

## 2022-03-29 MED ORDER — PEG-KCL-NACL-NASULF-NA ASC-C 100 G PO SOLR
0.5000 | Freq: Once | ORAL | Status: AC
  Administered 2022-03-29: 100 g via ORAL
  Filled 2022-03-29: qty 1

## 2022-03-29 MED ORDER — IOHEXOL 350 MG/ML SOLN
75.0000 mL | Freq: Once | INTRAVENOUS | Status: AC | PRN
  Administered 2022-03-29: 75 mL via INTRAVENOUS

## 2022-03-29 MED ORDER — IOHEXOL 9 MG/ML PO SOLN
500.0000 mL | ORAL | Status: AC
  Administered 2022-03-29 (×2): 500 mL via ORAL

## 2022-03-29 MED ORDER — DEXMEDETOMIDINE HCL IN NACL 400 MCG/100ML IV SOLN
INTRAVENOUS | Status: AC
  Administered 2022-03-29: 0.4 ug/kg/h via INTRAVENOUS
  Filled 2022-03-29: qty 100

## 2022-03-29 MED ORDER — SODIUM CHLORIDE 0.9 % IV SOLN
20.0000 ug | Freq: Once | INTRAVENOUS | Status: AC
  Administered 2022-03-29: 20 ug via INTRAVENOUS
  Filled 2022-03-29: qty 5

## 2022-03-29 NOTE — Progress Notes (Signed)
Progress Note   Subjective  Patient has had significant recurrent rectal bleeding overnight and this AM. Has passed a lot of red clots and passed bright red blood upon my exam. Family at bedside. Receiving unit of [facility]. She cannot explain if she is in pain or not. Dobhoff in place, flushed saline and got back clear gastric secretions,   Objective   Vital signs in last 24 hours: Temp:  [97.7 F (36.5 C)-101 F (38.3 C)] 98.4 F (36.9 C) (01/19 0919) Pulse Rate:  [112-117] 112 (01/19 0835) Resp:  [18-25] 21 (01/19 0919) BP: (123-182)/(75-109) 155/109 (01/19 0919) SpO2:  [94 %-99 %] 97 % (01/19 0835) Last BM Date : 03/28/22 General:    white female with dobhoff in placed Abdomen:  Soft, nontender and nondistended.   Intake/Output from previous day: 01/18 0701 - 01/19 0700 In: 2668.2 [I.V.:746.6; SWNIO:2703.3; NG/GT:675.3] Out: 2750 [Urine:2250; Stool:500] Intake/Output this shift: No intake/output data recorded.  Lab Results: Recent Labs    03/27/22 0311 03/28/22 0530 03/28/22 2240 03/29/22 0255 03/29/22 0630  WBC 17.2* 18.2*  --  19.9*  --   HGB 7.5* 6.0* 8.4* 7.0* 7.4*  HCT 22.6* 17.4* 24.2* 20.7* 21.0*  PLT 465* 373  --  305  --    BMET Recent Labs    03/27/22 0311 03/28/22 0530 03/29/22 0255  NA 140 136 136  K 4.3 4.2 4.6  CL 105 104 107  CO2 23 20* 17*  GLUCOSE 123* 116* 157*  BUN 73* 74* 72*  CREATININE 4.15* 4.17* 4.19*  CALCIUM 9.0 8.7* 8.9   LFT Recent Labs    03/29/22 0255  ALBUMIN 1.9*   PT/INR No results for input(s): "LABPROT", "INR" in the last 72 hours.  Studies/Results: CT ABDOMEN PELVIS WO CONTRAST  Result Date: 03/29/2022 CLINICAL DATA:  Acute, nonlocalized abdominal pain EXAM: CT ABDOMEN AND PELVIS WITHOUT CONTRAST TECHNIQUE: Multidetector CT imaging of the abdomen and pelvis was performed following the standard protocol without IV contrast. RADIATION DOSE REDUCTION: This exam was performed according to the departmental  dose-optimization program which includes automated exposure control, adjustment of the mA and/or kV according to patient size and/or use of iterative reconstruction technique. COMPARISON:  03/15/2022 FINDINGS: Lower chest: Patchy infiltrate in the lower lungs is likely improved. Hepatobiliary: No focal liver abnormality.Cholecystectomy without bile duct dilatation. Pancreas: Unremarkable. Spleen: Unremarkable. Adrenals/Urinary Tract: Negative adrenals. No hydronephrosis or ureteral stone. At least 2 renal calculi on both sides measuring up to 2 mm. The bladder is collapsed by Foley catheter. Stomach/Bowel: Diffuse colonic fluid levels reaching the rectum. Colonic wall thickening is improved. No bowel obstruction and no small bowel dilatation to imply ileus. An enteric tube tip is at the stomach. Vascular/Lymphatic: No acute vascular abnormality. Scattered atheromatous calcification of the aorta and iliacs. Reproductive:No acute finding Other: Decrease in pelvic fluid, essentially resolved. Musculoskeletal: No acute or aggressive finding. IMPRESSION: 1. Diarrheal illness with fluid reaching the rectum. Colitic wall thickening is improved from prior. 2. Improving bilateral pneumonia. 3. Bilateral nephrolithiasis without obstruction. Electronically Signed   By: Jorje Guild M.D.   On: 03/29/2022 07:11       Assessment / Plan:    57 year old female with prolonged hospitalization for sepsis, respiratory failure, AKI on HD.  She has developed anemia recently, had some hematochezia the other day that stopped.  Unfortunately recurrent significant bleeding overnight and this morning.  Her hemoglobin has dropped, receiving blood transfusion.  She is tachycardic but BP stable right now. Her  last colonoscopy was in 2019 and was a poor prep and incomplete. She did not follow up for another exam as was recommended. 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   We lavaged her stomach through the Dobbhoff in place  and returned clear gastric secretions.  Based on this result and what we are seeing I suspect she is having a significant lower GI bleed.  We discussed options in regards to how to treat this.  Normally the situation with active brisk bleeding CTA is the best test to identify source and potentially treat with embolization.  Clearly this has risk for placing her back on dialysis with her kidney disease.  I spoke with Dr. Marval Regal about this and it would be likely that she would need dialysis if she got IV contrast.  Alternatively, we could put bowel prep through her Dobbhoff and try to do a colonoscopy later today.  Concern for this would be continued blood loss over the course of hours and exsanguination, with likelihood for continued poor visibility during colonoscopy if she continues to bleed like this.  I had a lengthy discussion with the patient's son Marjory Lies and the patient's boyfriend and uncle.  They understand the issues at hand here and agree to proceed with CT angio today  which is the best test to hopefully localize this, and understand the risks for worsening renal function with this.  I also counseled them that I cannot guarantee CTA would localize her source of bleeding, but I think is her best chance given what we are seeing right now in terms of her output.  If this is negative and she happens to slow down, we will prep her for colonoscopy.  PLAN: - CTA now - full discussion including risks of likely needing dialysis with this - if positive contact IR for embolization attempt - if negative, will try to bowel prep through Dobhoff for colonoscopy - monitor Hgb, transfuse PRBC if needed - will discuss with primary service about evaluation for ICU   Call with questions, will await results of CTA  Jolly Mango, MD [facility]

## 2022-03-29 NOTE — Progress Notes (Signed)
NAME:  Darlene Barber, MRN:  [id number], DOB:  10-18-1965, LOS: 57 ADMISSION DATE:  03/10/2022, CONSULTATION DATE:  12/30  REFERRING MD:  Dr. Cinda Quest, CHIEF COMPLAINT:  Abdominal Pain    History of Present Illness:  [name] who presented to the [facility] ER on 12/31 with reports of abdominal pain and widespread rash.  On admission, the patient significant other reported that the day after eating at a local restaurant in [place], [place] she developed abdominal pain and rash.  She was reportedly up all night with crampy abdominal pain associated with nausea and vomiting.  Around 2 PM on the day of admission she noticed a rash all over her body and had worsening pain prompting him to call EMS.  She was evaluated in the emergency room and was found to have oxygen saturations of 87% on 2 L, afebrile with an elevated white blood cell count of 29.2.  Additional labs notable for acute kidney injury, anion gap metabolic acidosis and LFTs with mixed hepatic and cholestatic picture.  Stool samples were sent with workup of acute diarrhea.  Stool pathogen panel was negative.  C. difficile toxin was negative.  She was treated with 30 mL/kg IV fluid and started on empiric broad-spectrum antibiotics with vancomycin, cefepime and Flagyl.  She remained hypotensive despite IV fluid boluses and was started on vasopressors.  She was transferred to [facility] as there were no inpatient beds at [facility].    PCCM consulted for ICU admission.  Pertinent  Medical History  Chronic Pain Syndrome  Cervical Spondylitis  GERD  Bipolar Disorder  Tobacco Abuse - daily  THC Abuse - daily   Significant Hospital Events: Including procedures, antibiotic start and stop dates in addition to other pertinent events   12/31 admit with abdominal pain, diarrhea.  Left IJ TLC placement.  CT chest abdomen pelvis with fluid throughout the stomach, small bowel and colon, nonspecific but may represent diarrheal state.  No bowel obstruction,  definite bowel wall thickening or pneumoperitoneum.  Fluid within the esophagus may represent reflux or nonspecific esophageal motility disorder.  Trace amount of free pelvic fluid, nonspecific.  UDS positive for tricyclics, THC and amphetamines.  C. difficile negative, GI PCR negative.  COVID, flu, RSV negative.  Antibiotics narrowed to Zosyn. BC positives for strep infantarius.  1/3 CRRT, PEEP 5/60% 1/4 CRRT with negative balance. Precedex added to Fentanyl.  1/5 CT chest abd pelvis, TEE done with no evidence of vegetations 1/8 Taken off CRRT, MIVF started 1/9 too wild on precedex, severe HTN prevented extubation 1/10 restarted on CRRT 1/11 extubated, transferred from [facility] for iHD 1/13 Clonazepam 0.5 twice daily, Seroquel to twice daily 1/14 Precedex weaned to off this morning 03/29/2022 critical care called back for new GI bleed transferred back to intensive care unit GI has been consulted  Interim History / Subjective:   No acute events overnight.  Objective   Blood pressure (!) 155/109, pulse (!) 112, temperature 98.4 F (36.9 C), temperature source Oral, resp. rate (!) 21, height 4\' 10"  (1.473 m), weight 77.6 kg, SpO2 97 %.        Intake/Output Summary (Last 24 hours) at 03/29/2022 1108 Last data filed at 03/29/2022 0652 Gross per 24 hour  Intake 2668.21 ml  Output 2750 ml  Net -81.79 ml   Filed Weights   03/25/22 1255 03/27/22 0500 03/28/22 0500  Weight: 77 kg 77.4 kg 77.6 kg    Examination: General: Appears much older than stated age appears  to be hysterical HEENT: MM pink/moist no JVD Neuro: Consummately chance of need to go to the bathroom has poor understanding of current issues CV: Heart sounds are regular PULM:     GI: Rectal tube with bright red blood  Extremities: warm/dry, negative edema  Skin: no rashes or lesions   Resolved Hospital Problem list   Hypotension - sepsis + sedation  Transaminitis Shock Liver Rash, Hives TCP related to CRRT,  sepsis Multifocal pneumonia, possible VAP, completed course 1/11 Possible melena  Assessment & Plan:  New GI bleeding Transfer back to intensive care to the unit GI services is following Abdominal CT scan Transfuse as needed    Acute Metabolic / Toxic Encephalopathy  Remains confused, awakens to voice and tracking but not following commands or speaking. Likely related to sedation in setting of poor renal function. Off Precedex Continue to monitor  Normocytic anemia Recent Labs    03/29/22 0255 03/29/22 0630  HGB 7.0* 7.4*   Trend CBC especially she has a GI bleed  Renal Failure, likely ATN   Lab Results  Component Value Date   CREATININE 4.19 (H) 03/29/2022   CREATININE 4.17 (H) 03/28/2022   CREATININE 4.15 (H) 03/27/2022   CREATININE 0.83 09/30/2019   CREATININE 0.74 11/24/2018   CREATININE 0.78 05/22/2018  Continue to monitor creatinine May need hemodialysis again    UDS Positive: Amphetamines, Cannabis, TCA's  Cessation counseling when able   Bipolar Disorder Chronic Pain  Continue current medications  Hyperglycemia Sliding-scale insulin protocol  HTN Hold antihypertensives during GI bleeding episode  At Risk Malnutrition  Continue tube feeding until she can pass swallowing evaluation   03/29/2022 pulmonary critical care called back to bedside for GI bleeding and transferred back to intensive care Best Practice (right click and "Reselect all SmartList Selections" daily)  Diet/type: tubefeeds  DVT prophylaxis: prophylactic heparin  GI prophylaxis: PPI Lines: Central line and Dialysis Catheter (PICC 1/10-, R fem HD cath 1/2-)   Foley:  Yes, and it is still needed Code Status:  full code Last date of multidisciplinary goals of care discussion: Discussed with son POA Kyliyah Stirn 01/28/23  Richardson Landry Lexany Belknap ACNP Acute Care Nurse Practitioner [place] Please consult Amion 03/29/2022, 11:08 AM

## 2022-03-29 NOTE — H&P (View-Only) (Signed)
Progress Note   Subjective  Patient has had significant recurrent rectal bleeding overnight and this AM. Has passed a lot of red clots and passed bright red blood upon my exam. Family at bedside. Receiving unit of [facility]. She cannot explain if she is in pain or not. Dobhoff in place, flushed saline and got back clear gastric secretions,   Objective   Vital signs in last 24 hours: Temp:  [97.7 F (36.5 C)-101 F (38.3 C)] 98.4 F (36.9 C) (01/19 0919) Pulse Rate:  [112-117] 112 (01/19 0835) Resp:  [18-25] 21 (01/19 0919) BP: (123-182)/(75-109) 155/109 (01/19 0919) SpO2:  [94 %-99 %] 97 % (01/19 0835) Last BM Date : 03/28/22 General:    white female with dobhoff in placed Abdomen:  Soft, nontender and nondistended.   Intake/Output from previous day: 01/18 0701 - 01/19 0700 In: 2668.2 [I.V.:746.6; PYPPJ:0932.3; NG/GT:675.3] Out: 2750 [Urine:2250; Stool:500] Intake/Output this shift: No intake/output data recorded.  Lab Results: Recent Labs    03/27/22 0311 03/28/22 0530 03/28/22 2240 03/29/22 0255 03/29/22 0630  WBC 17.2* 18.2*  --  19.9*  --   HGB 7.5* 6.0* 8.4* 7.0* 7.4*  HCT 22.6* 17.4* 24.2* 20.7* 21.0*  PLT 465* 373  --  305  --    BMET Recent Labs    03/27/22 0311 03/28/22 0530 03/29/22 0255  NA 140 136 136  K 4.3 4.2 4.6  CL 105 104 107  CO2 23 20* 17*  GLUCOSE 123* 116* 157*  BUN 73* 74* 72*  CREATININE 4.15* 4.17* 4.19*  CALCIUM 9.0 8.7* 8.9   LFT Recent Labs    03/29/22 0255  ALBUMIN 1.9*   PT/INR No results for input(s): "LABPROT", "INR" in the last 72 hours.  Studies/Results: CT ABDOMEN PELVIS WO CONTRAST  Result Date: 03/29/2022 CLINICAL DATA:  Acute, nonlocalized abdominal pain EXAM: CT ABDOMEN AND PELVIS WITHOUT CONTRAST TECHNIQUE: Multidetector CT imaging of the abdomen and pelvis was performed following the standard protocol without IV contrast. RADIATION DOSE REDUCTION: This exam was performed according to the departmental  dose-optimization program which includes automated exposure control, adjustment of the mA and/or kV according to patient size and/or use of iterative reconstruction technique. COMPARISON:  03/15/2022 FINDINGS: Lower chest: Patchy infiltrate in the lower lungs is likely improved. Hepatobiliary: No focal liver abnormality.Cholecystectomy without bile duct dilatation. Pancreas: Unremarkable. Spleen: Unremarkable. Adrenals/Urinary Tract: Negative adrenals. No hydronephrosis or ureteral stone. At least 2 renal calculi on both sides measuring up to 2 mm. The bladder is collapsed by Foley catheter. Stomach/Bowel: Diffuse colonic fluid levels reaching the rectum. Colonic wall thickening is improved. No bowel obstruction and no small bowel dilatation to imply ileus. An enteric tube tip is at the stomach. Vascular/Lymphatic: No acute vascular abnormality. Scattered atheromatous calcification of the aorta and iliacs. Reproductive:No acute finding Other: Decrease in pelvic fluid, essentially resolved. Musculoskeletal: No acute or aggressive finding. IMPRESSION: 1. Diarrheal illness with fluid reaching the rectum. Colitic wall thickening is improved from prior. 2. Improving bilateral pneumonia. 3. Bilateral nephrolithiasis without obstruction. Electronically Signed   By: Jorje Guild M.D.   On: 03/29/2022 07:11       Assessment / Plan:    57 year old female with prolonged hospitalization for sepsis, respiratory failure, AKI on HD.  She has developed anemia recently, had some hematochezia the other day that stopped.  Unfortunately recurrent significant bleeding overnight and this morning.  Her hemoglobin has dropped, receiving blood transfusion.  She is tachycardic but BP stable right now. Her  last colonoscopy was in 2019 and was a poor prep and incomplete. She did not follow up for another exam as was recommended. 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   We lavaged her stomach through the Dobbhoff in place  and returned clear gastric secretions.  Based on this result and what we are seeing I suspect she is having a significant lower GI bleed.  We discussed options in regards to how to treat this.  Normally the situation with active brisk bleeding CTA is the best test to identify source and potentially treat with embolization.  Clearly this has risk for placing her back on dialysis with her kidney disease.  I spoke with Dr. Marval Regal about this and it would be likely that she would need dialysis if she got IV contrast.  Alternatively, we could put bowel prep through her Dobbhoff and try to do a colonoscopy later today.  Concern for this would be continued blood loss over the course of hours and exsanguination, with likelihood for continued poor visibility during colonoscopy if she continues to bleed like this.  I had a lengthy discussion with the patient's son Marjory Lies and the patient's boyfriend and uncle.  They understand the issues at hand here and agree to proceed with CT angio today  which is the best test to hopefully localize this, and understand the risks for worsening renal function with this.  I also counseled them that I cannot guarantee CTA would localize her source of bleeding, but I think is her best chance given what we are seeing right now in terms of her output.  If this is negative and she happens to slow down, we will prep her for colonoscopy.  PLAN: - CTA now - full discussion including risks of likely needing dialysis with this - if positive contact IR for embolization attempt - if negative, will try to bowel prep through Dobhoff for colonoscopy - monitor Hgb, transfuse PRBC if needed - will discuss with primary service about evaluation for ICU   Call with questions, will await results of CTA  Jolly Mango, MD [facility]

## 2022-03-29 NOTE — Progress Notes (Signed)
GI UPDATE:  CTA done. Unfortunately nondiagnostic. Her bleeding has definitely slowed down over the past few hours however. Getting repeat Hgb now. Discussed with family at bedside. If she can tolerate it, next step would be a colonoscopy. We can prep via the dobhoff and try to do a colonoscopy tomorrow morning if she is otherwise stable. Counseled the patient and family on this, risks / benefits. They want to proceed. Will need to monitor renal function, at risk for needing dialysis with contrast given today. She is going to the ICU for closer monitoring and care overnight. If significant rebleeding let us know. Will start bowel prep this evening. Transfuse PRBC if needed.  Jolly Mango, MD [facility]

## 2022-03-29 NOTE — Progress Notes (Signed)
Progress Note  Patient was transferred to the ICU.   Patient: Darlene Barber JEH:[id number] DOB: [birth year] DOA: 03/10/2022     19 DOS: the patient was seen and examined on 03/29/2022   Brief hospital course: 57 year old female initially admitted to the ICU with septic shock found to have Strep Infantarius Bacteremia complicated by acute renal failure and briefly required CRRT.  Patient has a PMH of PAF on Eliquis here with significant recurrent rectal bleeding overnight who is on her 4th unit of pRBC in the last 24 hours.  She is actively bleeding on exam.  This is all concerning for a lower GI bleed.  I spoke with Dr. Havery Moros.  We will order CTA STAT for potential IR embolization and transfer to the ICU.  If CTA is non-diagnostic, GI will perform colonoscopy.  Patient also has significant renal failure and Nephrology is following - she may require dialysis as well.  12/31 admit with abdominal pain, diarrhea.  Left IJ TLC placement.  CT chest abdomen pelvis with fluid throughout the stomach, small bowel and colon, nonspecific but may represent diarrheal state.  No bowel obstruction, definite bowel wall thickening or pneumoperitoneum.  Fluid within the esophagus may represent reflux or nonspecific esophageal motility disorder.  Trace amount of free pelvic fluid, nonspecific.  UDS positive for tricyclics, THC and amphetamines.  C. difficile negative, GI PCR negative.  COVID, flu, RSV negative.  Antibiotics narrowed to Zosyn. BC positives for strep infantarius.  1/3 CRRT, PEEP 5/60% 1/4 CRRT with negative balance. Precedex added to Fentanyl.  1/5 CT chest abd pelvis, TEE done with no evidence of vegetations 1/8 Taken off CRRT, MIVF started, weaned pressors 1/9 too wild on precedex, severe HTN prevented extubation 1/10 restarted on CRRT 1/11 extubated, transferred from [facility] for iHD 1/13 Clonazepam 0.5 twice daily, Seroquel to twice daily 1/14 Precedex weaned off 1/17 - transfer to [place] and Plan:  Acute Hematochezia / Lower GI Bleed: - CTA did not identify source of bleed. - GI will perform colonoscopy, appreciate. - Continue IV Protonix and transfuse blood as needed. - Hold Eliquis.  AKI on CKD3 - ischemic ATN with component of contrast nephropathy: - Creatinine is up to 4 mg/dL with baseline ~ 1.8 mg/dL.  03/25/22 03/26/22 03/27/22 03/28/22 03/29/22  Creatinine 5.29 (H) 3.58 (H) 4.15 (H) 4.17 (H) 4.19 (H)  - Nephrology is following and will consider dialysis, appreciate.   Chronic Diastolic Failure with Peripheral Edema: - Monitor volume status while getting multiple units of blood.  Streptococcus Bacteremia: TEE was negative for endocarditis. - Patient completed IV Ceftriaxone on 1/17.  Polysubstance Abuse: - Counseled on cessation.  Essential Hypertension: - Hold BP meds for now.  Bipolar Disorder: - Continue home Vilazodone and continue Seroquel 50 mg BID - this may need to be up-titrated as patient is very anxious today.  Subjective: Patient has diffuse edema and is volume overloaded on exam.  But she has no crackles or shortness of breath.  Respiratory status is stable. She is very anxious about her bleeding. I reassured her to the best of my ability. I discussed plan of care with her partner at bedside and brother over the phone.  Physical Exam: Vitals:   03/29/22 1600 03/29/22 1615 03/29/22 1623 03/29/22 1630  BP: (!) 145/87 (!) 158/125 (!) 149/99 (!) 152/96  Pulse: (!) 114 (!) 113 (!) 116 (!) 114  Resp: (!) 24 (!) 23 (!) 26 15  Temp:  99.9 F (37.7 C)  100 F (37.8 C) 100.2 F (37.9 C)  TempSrc:   Bladder   SpO2: 98% 98% 98% 96%  Weight:      Height:       Physical Exam Constitutional:      General: She is in acute distress.     Appearance: She is obese.     Comments: Actively bleeding Very anxious  HENT:     Head: Normocephalic and atraumatic.     Mouth/Throat:     Mouth: Mucous membranes are moist.  Eyes:      Extraocular Movements: Extraocular movements intact.     Pupils: Pupils are equal, round, and reactive to light.  Cardiovascular:     Rate and Rhythm: Tachycardia present. Rhythm irregular.     Pulses: Normal pulses.     Heart sounds: Normal heart sounds.  Pulmonary:     Breath sounds: Normal breath sounds.     Comments: No crackles Abdominal:     Palpations: Abdomen is soft.     Tenderness: There is abdominal tenderness.  Musculoskeletal:     Cervical back: Normal range of motion and neck supple.  Skin:    General: Skin is warm and dry.     Capillary Refill: Capillary refill takes less than 2 seconds.  Neurological:     General: No focal deficit present.     Data Reviewed: CT ANGIO GI BLEED CLINICAL DATA:  57 year old female with lower GI bleed.  EXAM: CTA ABDOMEN AND PELVIS WITHOUT AND WITH CONTRAST  TECHNIQUE: Multidetector CT imaging of the abdomen and pelvis was performed using the standard protocol during bolus administration of intravenous contrast. Multiplanar reconstructed images and MIPs were obtained and reviewed to evaluate the vascular anatomy.  RADIATION DOSE REDUCTION: This exam was performed according to the departmental dose-optimization program which includes automated exposure control, adjustment of the mA and/or kV according to patient size and/or use of iterative reconstruction technique.  CONTRAST:  85mL OMNIPAQUE IOHEXOL 350 MG/ML SOLN  COMPARISON:  CT abdomen and pelvis with oral contrast on 03/29/2022 and CT from 03/15/2022  FINDINGS: VASCULAR  Aorta: Minimal atherosclerotic disease in the abdominal aorta without aneurysm, dissection or significant stenosis.  Celiac: Patent without evidence of aneurysm, dissection, vasculitis or significant stenosis.  SMA: Patent without evidence of aneurysm, dissection, vasculitis or significant stenosis.  Renals: Both renal arteries are patent without evidence of aneurysm, dissection,  vasculitis, fibromuscular dysplasia or significant stenosis.  IMA: Patent without evidence of aneurysm, dissection, vasculitis or significant stenosis.  Inflow: Patent without evidence of aneurysm, dissection, vasculitis or significant stenosis.  Proximal Outflow: Proximal femoral arteries are patent bilaterally.  Veins: Portal venous system is patent. IVC and renal veins are patent. Main iliac veins are patent. Proximal femoral veins are patent.  Review of the MIP images confirms the above findings.  NON-VASCULAR  Lower chest: Residual patchy densities in the right middle lobe. No pleural effusions.  Hepatobiliary: Cholecystectomy. Normal appearance of the liver without biliary dilatation.  Pancreas: Unremarkable. No pancreatic ductal dilatation or surrounding inflammatory changes.  Spleen: Normal in size without focal abnormality.  Adrenals/Urinary Tract: Normal adrenal glands. At least 2 small left renal calculi, largest measures 2 mm. 3 mm calculus in the right kidney the upper/mid pole region. Probable cyst in the right kidney lower pole that does not require dedicated follow-up. Questionable 6 mm fat attenuating structure along the right kidney upper pole on image 44/5. This could be related to a cortical scar/cleft versus a small fat containing structure such as  a tiny angiomyolipoma. This is not significantly changed since 2011 and does not require dedicated follow-up. No hydronephrosis. Urinary bladder is decompressed with a Foley catheter. No ureter dilatation.  Stomach/Bowel: Nasogastric tube terminates in the stomach body. Limited evaluation for active GI bleeding because there is oral contrast in the small and large bowel. Mild focal wall thickening in the colon near the splenic flexure on image 53/54. Mild pericolonic stranding involving the ascending colon. There is contrast within the normal appearing appendix. Contrast in the distal small bowel without  bowel dilatation or obstruction. Normal appearance of the stomach.  Lymphatic: Again noted are small periaortic lymph nodes in the abdomen. No significant lymph node enlargement in the abdomen or pelvis.  Reproductive: Uterus appears to be surgically absent. No evidence for an adnexal mass.  Other: Diffuse subcutaneous edema. Trace fluid in the pelvis and perihepatic region. Negative for free air.  Musculoskeletal: No acute bone abnormality. Scattered subcutaneous densities most likely related to prior injection sites.  IMPRESSION: VASCULAR  1. Minimal atherosclerotic disease in the abdomen or pelvis. Aorta and main visceral arteries are patent without significant stenosis.  NON-VASCULAR  1. Very limited evaluation for active GI bleeding because there was oral contrast within the distal small bowel and throughout the colon. Active GI bleeding cannot be assessed on this examination. 2. Residual mild colonic wall thickening most prominent near the splenic fracture. There may be mild colonic wall thickening in the right colon. Findings are compatible with residual or persistent colitis. 3. Nonobstructive renal calculi. 4. Diffuse subcutaneous edema.  Electronically Signed   By: Markus Daft M.D.   On: 03/29/2022 14:19 CT ABDOMEN PELVIS WO CONTRAST CLINICAL DATA:  Acute, nonlocalized abdominal pain  EXAM: CT ABDOMEN AND PELVIS WITHOUT CONTRAST  TECHNIQUE: Multidetector CT imaging of the abdomen and pelvis was performed following the standard protocol without IV contrast.  RADIATION DOSE REDUCTION: This exam was performed according to the departmental dose-optimization program which includes automated exposure control, adjustment of the mA and/or kV according to patient size and/or use of iterative reconstruction technique.  COMPARISON:  03/15/2022  FINDINGS: Lower chest: Patchy infiltrate in the lower lungs is likely improved.  Hepatobiliary: No focal liver  abnormality.Cholecystectomy without bile duct dilatation.  Pancreas: Unremarkable.  Spleen: Unremarkable.  Adrenals/Urinary Tract: Negative adrenals. No hydronephrosis or ureteral stone. At least 2 renal calculi on both sides measuring up to 2 mm. The bladder is collapsed by Foley catheter.  Stomach/Bowel: Diffuse colonic fluid levels reaching the rectum. Colonic wall thickening is improved. No bowel obstruction and no small bowel dilatation to imply ileus. An enteric tube tip is at the stomach.  Vascular/Lymphatic: No acute vascular abnormality. Scattered atheromatous calcification of the aorta and iliacs.  Reproductive:No acute finding  Other: Decrease in pelvic fluid, essentially resolved.  Musculoskeletal: No acute or aggressive finding.  IMPRESSION: 1. Diarrheal illness with fluid reaching the rectum. Colitic wall thickening is improved from prior. 2. Improving bilateral pneumonia. 3. Bilateral nephrolithiasis without obstruction.  Electronically Signed   By: Jorje Guild M.D.   On: 03/29/2022 07:11   Family Communication: Spoke with family over the phone.  Disposition: Status is: Inpatient   Planned Discharge Destination: Home  Time spent: >60 minutes  Author: George Hugh, MD 03/29/2022 5:16 PM  For on call review [email or web address].

## 2022-03-29 NOTE — Progress Notes (Signed)
Patient had significant output of dark red blood out put. Suctioned 450 into cannister from bed. Pads and sheets soaked in blood. ~100 captured into rectal pouch.   Remains hypertensive and tachycardic. Contacted Dr Alcario Drought.

## 2022-03-29 NOTE — Progress Notes (Signed)
Patient ID: Darlene Barber, female   DOB: 06-Dec-1965, 57 y.o.   MRN: [id number] S: Pt developed hematochezia last night and has ongoing GI bleed this morning and receiving blood transfusion.  O:BP (!) 143/89   Pulse (!) 112   Temp 97.9 F (36.6 C) (Oral)   Resp 18   Ht 4\' 10"  (1.473 m)   Wt 77.6 kg   SpO2 97%   BMI 35.76 kg/m   Intake/Output Summary (Last 24 hours) at 03/29/2022 0926 Last data filed at 03/29/2022 9728 Gross per 24 hour  Intake 2668.21 ml  Output 2750 ml  Net -81.79 ml   Intake/Output: I/O last 3 completed shifts: In: 3119.3 [I.V.:1197.6; Blood:1246.3; NG/GT:675.3] Out: 4750 [Urine:3450; ASUOR:5615]  Intake/Output this shift:  No intake/output data recorded. Weight change:  Gen:NAD CVS: Tachy at 112 Resp:CTA Abd: +BS, soft, NT/ND Ext: no edema  Recent Labs  Lab 03/23/22 0349 03/24/22 0549 03/25/22 0411 03/26/22 0438 03/26/22 0734 03/27/22 0311 03/28/22 0530 03/29/22 0255  NA 139 142 142 140  --  140 136 136  K 4.9 5.5* 5.6* 4.3  --  4.3 4.2 4.6  CL 108 112* 114* 106  --  105 104 107  CO2 18* 18* 16* 22  --  23 20* 17*  GLUCOSE 179* 192* 162* 159*  --  123* 116* 157*  BUN 64* 82* 98* 62*  --  73* 74* 72*  CREATININE 3.91* 4.96* 5.29* 3.58*  --  4.15* 4.17* 4.19*  ALBUMIN  --  1.8* 1.8* 1.9* 2.0* 2.0* 1.9* 1.9*  CALCIUM 8.5* 8.9 9.0 8.3*  --  9.0 8.7* 8.9  PHOS  --  7.9* 8.4* 6.3*  --  6.8* 5.9* 6.8*  AST  --   --   --   --  24  --   --   --   ALT  --   --   --   --  27  --   --   --    Liver Function Tests: Recent Labs  Lab 03/26/22 0734 03/27/22 0311 03/28/22 0530 03/29/22 0255  AST 24  --   --   --   ALT 27  --   --   --   ALKPHOS 94  --   --   --   BILITOT 0.4  --   --   --   PROT 5.6*  --   --   --   ALBUMIN 2.0* 2.0* 1.9* 1.9*   No results for input(s): "LIPASE", "AMYLASE" in the last 168 hours. No results for input(s): "AMMONIA" in the last 168 hours. CBC: Recent Labs  Lab 03/24/22 0549 03/26/22 0438 03/27/22 0311  03/28/22 0530 03/28/22 2240 03/29/22 0255 03/29/22 0630  WBC 16.3* 16.3* 17.2* 18.2*  --  19.9*  --   HGB 8.3* 8.0* 7.5* 6.0* 8.4* 7.0* 7.4*  HCT 24.5* 23.4* 22.6* 17.4* 24.2* 20.7* 21.0*  MCV 93.5 93.2 94.6 94.1  --  88.5  --   PLT 504* 494* 465* 373  --  305  --    Cardiac Enzymes: No results for input(s): "CKTOTAL", "CKMB", "CKMBINDEX", "TROPONINI" in the last 168 hours. CBG: Recent Labs  Lab 03/28/22 1603 03/28/22 1948 03/28/22 2331 03/29/22 0339 03/29/22 0833  GLUCAP 143* 155* 104* 164* 124*    Iron Studies: No results for input(s): "IRON", "TIBC", "TRANSFERRIN", "FERRITIN" in the last 72 hours. Studies/Results: CT ABDOMEN PELVIS WO CONTRAST  Result Date: 03/29/2022 CLINICAL DATA:  Acute, nonlocalized abdominal pain EXAM: CT ABDOMEN  AND PELVIS WITHOUT CONTRAST TECHNIQUE: Multidetector CT imaging of the abdomen and pelvis was performed following the standard protocol without IV contrast. RADIATION DOSE REDUCTION: This exam was performed according to the departmental dose-optimization program which includes automated exposure control, adjustment of the mA and/or kV according to patient size and/or use of iterative reconstruction technique. COMPARISON:  03/15/2022 FINDINGS: Lower chest: Patchy infiltrate in the lower lungs is likely improved. Hepatobiliary: No focal liver abnormality.Cholecystectomy without bile duct dilatation. Pancreas: Unremarkable. Spleen: Unremarkable. Adrenals/Urinary Tract: Negative adrenals. No hydronephrosis or ureteral stone. At least 2 renal calculi on both sides measuring up to 2 mm. The bladder is collapsed by Foley catheter. Stomach/Bowel: Diffuse colonic fluid levels reaching the rectum. Colonic wall thickening is improved. No bowel obstruction and no small bowel dilatation to imply ileus. An enteric tube tip is at the stomach. Vascular/Lymphatic: No acute vascular abnormality. Scattered atheromatous calcification of the aorta and iliacs. Reproductive:No  acute finding Other: Decrease in pelvic fluid, essentially resolved. Musculoskeletal: No acute or aggressive finding. IMPRESSION: 1. Diarrheal illness with fluid reaching the rectum. Colitic wall thickening is improved from prior. 2. Improving bilateral pneumonia. 3. Bilateral nephrolithiasis without obstruction. Electronically Signed   By: Jorje Guild M.D.   On: 03/29/2022 07:11    sodium chloride   Intravenous Once   amLODipine  10 mg Oral Daily   arformoterol  15 mcg Nebulization BID   budesonide (PULMICORT) nebulizer solution  0.5 mg Nebulization BID   Chlorhexidine Gluconate Cloth  6 each Topical Daily   darbepoetin (ARANESP) injection - DIALYSIS  40 mcg Subcutaneous Q Fri-1800   feeding supplement (NEPRO CARB STEADY)  1,000 mL Per Tube Q24H   fiber supplement (BANATROL TF)  60 mL Per Tube BID   insulin aspart  0-15 Units Subcutaneous Q4H   insulin aspart  3 Units Subcutaneous Q4H   multivitamin  1 tablet Oral QHS   mouth rinse  15 mL Mouth Rinse 4 times per day   [START ON 03/31/2022] pantoprazole  40 mg Intravenous Q12H   QUEtiapine  50 mg Oral BID   revefenacin  175 mcg Nebulization Daily   sodium chloride flush  10-40 mL Intracatheter Q12H   Vilazodone HCl  40 mg Oral Daily    BMET    Component Value Date/Time   NA 136 03/29/2022 0255   NA 137 04/20/2012 1725   K 4.6 03/29/2022 0255   K 4.2 04/20/2012 1725   CL 107 03/29/2022 0255   CL 106 04/20/2012 1725   CO2 17 (L) 03/29/2022 0255   CO2 24 04/20/2012 1725   GLUCOSE 157 (H) 03/29/2022 0255   GLUCOSE 97 04/20/2012 1725   BUN 72 (H) 03/29/2022 0255   BUN 14 04/20/2012 1725   CREATININE 4.19 (H) 03/29/2022 0255   CREATININE 0.83 09/30/2019 1057   CALCIUM 8.9 03/29/2022 0255   CALCIUM 9.2 04/20/2012 1725   GFRNONAA 12 (L) 03/29/2022 0255   GFRNONAA 81 09/30/2019 1057   GFRAA 93 09/30/2019 1057   CBC    Component Value Date/Time   WBC 19.9 (H) 03/29/2022 0255   RBC 2.34 (L) 03/29/2022 0255   HGB 7.4 (L)  03/29/2022 0630   HGB 16.7 (H) 04/20/2012 1725   HCT 21.0 (L) 03/29/2022 0630   HCT 50.0 (H) 04/20/2012 1725   PLT 305 03/29/2022 0255   PLT 331 04/20/2012 1725   MCV 88.5 03/29/2022 0255   MCV 98 04/20/2012 1725   MCH 29.9 03/29/2022 0255   MCHC 33.8 03/29/2022 0255  RDW 18.5 (H) 03/29/2022 0255   RDW 14.2 04/20/2012 1725   LYMPHSABS 1.4 03/09/2022 1350   LYMPHSABS 2.7 08/27/2011 0558   MONOABS 2.0 (H) 03/09/2022 1350   MONOABS 0.5 08/27/2011 0558   EOSABS 0.2 03/09/2022 1350   EOSABS 0.5 08/27/2011 0558   BASOSABS 0.1 03/09/2022 1350   BASOSABS 0.1 08/27/2011 0558    Assessment/ Plan: AKI - crt of 0.8 in August of 2023, bland UA in October of 23.  Now with AKI , proteinuria and hematuria but also pyuria.  Seems most likely to be AKI from ATN in the setting of bacteremia. Given the proteinuria and hematuria concerning for GN, some serologies were sent and were generally negative (see above). 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 SP CRRT 1/02- 1/11. Tx'd to [facility] 1/11. UOP was low and wts were dropping down, so cautious IVF's started for suspected vol depletion.  UOP improving and Scr appears to have reached a plateau. Continue with IVF's. Starting to see some renal recovery.   Will hold off on HD and follow UOP and Scr. Right femoral HD catheter removed 03/26/22.   Will follow response and decide if she will need a tunneled catheter and ongoing HD.   ABLA with evidence of acute GI bleed - pt with hematochezia last night and this morning.  Receiving blood transfusion.  Will need GI evaluation.   Hyperkalemia - lokelma x 2 and switching TF's to Nepro which has resolved issue. Volume - persistent infiltrates on CXR are likely infection-related.  HTN - BP's have improved.  Back on scheduled meds.  Normocytic Anemia  - deferring IV iron in setting of infection.  Aranesp 40 mcg every Thursday.  Hgb dropped to 6, will need blood  transfusion.  Gi consulted for ABLA.  Strep bacteremia-  streptococcus infantarius, TEE negative, getting Rocephin thru 1/17. Colonoscopy when more stable per pmd.  Acute hypoxic respiratory failure - extubated on 1/10, on high level [place] O2 now. Repeat CXR's show persistent bilat infiltrates as above H/o polysubstance abuse  Donetta Potts, MD [facility]

## 2022-03-29 NOTE — Progress Notes (Signed)
TR[facility] night coverage note:  Pt seen earlier in shift, having some abd pain and abd fullness today more so than yesterday.  No rebound nor guarding on abdominal exam though.  CT AP without IV contrast (with PO contrast) ordered given above and given new onset GIB as of yesterday (can't use IV contrast due to AKF).  Later in shift, has had ~500cc hematochezia.  Repeat CBC performed, HGB down to 7.0 from 8.4 previously.  1u PRBC transfusion ordered.[name]

## 2022-03-30 ENCOUNTER — Inpatient Hospital Stay (HOSPITAL_COMMUNITY): Payer: Medicaid Other | Admitting: Anesthesiology

## 2022-03-30 ENCOUNTER — Inpatient Hospital Stay (HOSPITAL_COMMUNITY): Payer: Medicaid Other

## 2022-03-30 ENCOUNTER — Encounter (HOSPITAL_COMMUNITY)
Disposition: A | Discharge status: Skilled Nursing Facility | Payer: Self-pay | Source: Other Acute Inpatient Hospital | Attending: Internal Medicine

## 2022-03-30 DIAGNOSIS — D500 Iron deficiency anemia secondary to blood loss (chronic): Secondary | ICD-10-CM

## 2022-03-30 DIAGNOSIS — I10 Essential (primary) hypertension: Secondary | ICD-10-CM | POA: Diagnosis not present

## 2022-03-30 DIAGNOSIS — K922 Gastrointestinal hemorrhage, unspecified: Secondary | ICD-10-CM | POA: Diagnosis not present

## 2022-03-30 DIAGNOSIS — K633 Ulcer of intestine: Secondary | ICD-10-CM | POA: Diagnosis not present

## 2022-03-30 DIAGNOSIS — J189 Pneumonia, unspecified organism: Secondary | ICD-10-CM | POA: Diagnosis not present

## 2022-03-30 DIAGNOSIS — F17210 Nicotine dependence, cigarettes, uncomplicated: Secondary | ICD-10-CM

## 2022-03-30 DIAGNOSIS — K529 Noninfective gastroenteritis and colitis, unspecified: Secondary | ICD-10-CM | POA: Diagnosis not present

## 2022-03-30 HISTORY — PX: COLONOSCOPY WITH PROPOFOL: SHX5780

## 2022-03-30 HISTORY — PX: BIOPSY: SHX5522

## 2022-03-30 HISTORY — PX: ESOPHAGOGASTRODUODENOSCOPY (EGD) WITH PROPOFOL: SHX5813

## 2022-03-30 LAB — CBC
HCT: 18.7 % — ABNORMAL LOW (ref 36.0–46.0)
HCT: 21.6 % — ABNORMAL LOW (ref 36.0–46.0)
HCT: 19.8 % — ABNORMAL LOW (ref 36.0–46.0)
Hemoglobin: 6.4 g/dL — CL (ref 12.0–15.0)
Hemoglobin: 7.4 g/dL — ABNORMAL LOW (ref 12.0–15.0)
Hemoglobin: 6.7 g/dL — CL (ref 12.0–15.0)
MCH: 30.2 pg (ref 26.0–34.0)
MCH: 30.1 pg (ref 26.0–34.0)
MCH: 30.3 pg (ref 26.0–34.0)
MCHC: 34.2 g/dL (ref 30.0–36.0)
MCHC: 34.3 g/dL (ref 30.0–36.0)
MCHC: 33.8 g/dL (ref 30.0–36.0)
MCV: 88.2 fL (ref 80.0–100.0)
MCV: 87.8 fL (ref 80.0–100.0)
MCV: 89.6 fL (ref 80.0–100.0)
Platelets: 226 10*3/uL (ref 150–400)
Platelets: 238 10*3/uL (ref 150–400)
Platelets: 244 10*3/uL (ref 150–400)
RBC: 2.12 MIL/uL — ABNORMAL LOW (ref 3.87–5.11)
RBC: 2.46 MIL/uL — ABNORMAL LOW (ref 3.87–5.11)
RBC: 2.21 MIL/uL — ABNORMAL LOW (ref 3.87–5.11)
RDW: 18.4 % — ABNORMAL HIGH (ref 11.5–15.5)
RDW: 17.6 % — ABNORMAL HIGH (ref 11.5–15.5)
RDW: 17.8 % — ABNORMAL HIGH (ref 11.5–15.5)
WBC: 10.4 10*3/uL (ref 4.0–10.5)
WBC: 14.5 10*3/uL — ABNORMAL HIGH (ref 4.0–10.5)
WBC: 10.6 10*3/uL — ABNORMAL HIGH (ref 4.0–10.5)
nRBC: 0.8 % — ABNORMAL HIGH (ref 0.0–0.2)
nRBC: 1.2 % — ABNORMAL HIGH (ref 0.0–0.2)
nRBC: 1.8 % — ABNORMAL HIGH (ref 0.0–0.2)

## 2022-03-30 LAB — RENAL FUNCTION PANEL
Albumin: <1.5 g/dL — ABNORMAL LOW (ref 3.5–5.0)
Anion gap: 12 (ref 5–15)
BUN: 63 mg/dL — ABNORMAL HIGH (ref 6–20)
CO2: 14 mmol/L — ABNORMAL LOW (ref 22–32)
Calcium: 8.5 mg/dL — ABNORMAL LOW (ref 8.9–10.3)
Chloride: 111 mmol/L (ref 98–111)
Creatinine, Ser: 4.24 mg/dL — ABNORMAL HIGH (ref 0.44–1.00)
GFR, Estimated: 12 mL/min — ABNORMAL LOW (ref >60)
Glucose, Bld: 113 mg/dL — ABNORMAL HIGH (ref 70–99)
Phosphorus: 8 mg/dL — ABNORMAL HIGH (ref 2.5–4.6)
Potassium: 5.3 mmol/L — ABNORMAL HIGH (ref 3.5–5.1)
Sodium: 137 mmol/L (ref 135–145)

## 2022-03-30 LAB — PROTIME-INR
INR: 1.4 — ABNORMAL HIGH (ref 0.8–1.2)
Prothrombin Time: 16.6 seconds — ABNORMAL HIGH (ref 11.4–15.2)

## 2022-03-30 LAB — GLUCOSE, CAPILLARY
Glucose-Capillary: 112 mg/dL — ABNORMAL HIGH (ref 70–99)
Glucose-Capillary: 119 mg/dL — ABNORMAL HIGH (ref 70–99)
Glucose-Capillary: 79 mg/dL (ref 70–99)
Glucose-Capillary: 80 mg/dL (ref 70–99)
Glucose-Capillary: 147 mg/dL — ABNORMAL HIGH (ref 70–99)
Glucose-Capillary: 117 mg/dL — ABNORMAL HIGH (ref 70–99)
Glucose-Capillary: 57 mg/dL — ABNORMAL LOW (ref 70–99)
Glucose-Capillary: 97 mg/dL (ref 70–99)

## 2022-03-30 LAB — PREPARE RBC (CROSSMATCH)

## 2022-03-30 LAB — APTT: aPTT: 29 seconds (ref 24–36)

## 2022-03-30 SURGERY — COLONOSCOPY WITH PROPOFOL
Anesthesia: Monitor Anesthesia Care

## 2022-03-30 MED ORDER — SODIUM CHLORIDE 0.9% IV SOLUTION: Freq: Once | INTRAVENOUS | Status: DC

## 2022-03-30 MED ORDER — SODIUM CHLORIDE 0.9% IV SOLUTION: Freq: Once | INTRAVENOUS | Status: AC

## 2022-03-30 MED ORDER — PROPOFOL 500 MG/50ML IV EMUL
INTRAVENOUS | Status: DC | PRN
  Administered 2022-03-30: 150 ug/kg/min via INTRAVENOUS

## 2022-03-30 MED ORDER — DEXTROSE-NACL 5-0.45 % IV SOLN: INTRAVENOUS | Status: DC

## 2022-03-30 MED ORDER — PHENYLEPHRINE 80 MCG/ML (10ML) SYRINGE FOR IV PUSH (FOR BLOOD PRESSURE SUPPORT)
PREFILLED_SYRINGE | INTRAVENOUS | Status: DC | PRN
  Administered 2022-03-30: 160 ug via INTRAVENOUS
  Administered 2022-03-30: 80 ug via INTRAVENOUS

## 2022-03-30 MED ORDER — PROPOFOL 10 MG/ML IV BOLUS
INTRAVENOUS | Status: DC | PRN
  Administered 2022-03-30: 80 mg via INTRAVENOUS

## 2022-03-30 MED ORDER — ACETAMINOPHEN 325 MG PO TABS
650.0000 mg | ORAL_TABLET | Freq: Four times a day (QID) | ORAL | Status: DC | PRN
  Administered 2022-03-31 – 2022-04-05 (×7): 650 mg via ORAL
  Filled 2022-03-30 (×8): qty 2

## 2022-03-30 MED ORDER — DEXTROSE 50 % IV SOLN
INTRAVENOUS | Status: AC
  Filled 2022-03-30: qty 50

## 2022-03-30 MED ORDER — PIPERACILLIN-TAZOBACTAM 3.375 G IVPB 30 MIN
3.3750 g | Freq: Once | INTRAVENOUS | Status: AC
  Administered 2022-03-30: 3.375 g via INTRAVENOUS
  Filled 2022-03-30 (×2): qty 50

## 2022-03-30 MED ORDER — DEXTROSE 50 % IV SOLN
12.5000 g | INTRAVENOUS | Status: AC
  Administered 2022-03-30: 12.5 g via INTRAVENOUS

## 2022-03-30 MED ORDER — PIPERACILLIN-TAZOBACTAM IN DEX 2-0.25 GM/50ML IV SOLN
2.2500 g | Freq: Three times a day (TID) | INTRAVENOUS | Status: AC
  Administered 2022-03-31 – 2022-04-05 (×18): 2.25 g via INTRAVENOUS
  Filled 2022-03-30 (×21): qty 50

## 2022-03-30 SURGICAL SUPPLY — 22 items

## 2022-03-30 NOTE — Progress Notes (Signed)
PCCM Progress Note   GI discussed colonoscopy finding to include; multiple large clean based ulcers found in the sigmoid colon, in the descending colon, in the transverse colon, at the hepatic flexure and in the ascending colon. Ulcers were deeper and larger in the right colon. Could not see any with stigmata for bleeding, however could not clear right colon / cecum due to clot burdern.   With above findings GI recommended either pursuing a second CT bleeding study to help identify bleeding site with the understanding that this will likely result in a kidney injury that will result in permeant hemodialysis versus general surgery consult for possible extensive colectomy which will be very high risk given patents deconditioning.   I called and discussed case with Dr. Bobbye Morton who recommended optimizing medical therapies prior to proceeding with extensive surgery.   These options were discussed with family and decision was made to proceed with CT with the understanding of kidney injury.   Therefore if patient shows signs of re-bleeding overnight we will obtain CT bleeding study.   Darlene Barber D. Harris, NP-C [facility] Personal contact information can be found on Amion  If no contact or response made please call [phone] 03/30/2022, 7:01 PM

## 2022-03-30 NOTE — Consult Note (Signed)
Reason for Consult/Chief Complaint: bleeding colon ulcers Consultant: Kenton Kingfisher,  NP  Darlene Barber is an 57 y.o. female.   HPI: 53F with  prolonged hospitalization for sepsis, respiratory failure, AKI on RRT which now been dicontinued with some evidence of renal recovery. She developed anemia with suspected LGIB and underwent colonoscopy earlier gtoday showing evidence of recent bleeding, diffuse colonic ulcers, and an inability to identify any active bleeding.  Past Medical History:  Diagnosis Date   Allergy    Anxiety    Bursitis of both hips    Depression    Frequent headaches    Obesity    Sleep apnea    doesn't use CPAP machine broken,     Past Surgical History:  Procedure Laterality Date   CESAREAN SECTION     x2   CHOLECYSTECTOMY     COLONOSCOPY WITH PROPOFOL N/A 11/20/2017   Procedure: COLONOSCOPY WITH PROPOFOL;  Surgeon: Lin Landsman, MD;  Location: [facility] ENDOSCOPY;  Service: Gastroenterology;  Laterality: N/A;   HYSTERECTOMY ABDOMINAL WITH SALPINGECTOMY  1998   HYSTEROSCOPY      Family History  Problem Relation Age of Onset   Hodgkin's lymphoma Mother    Heart failure Maternal Grandmother    Alzheimer's disease Maternal Grandfather    Emphysema Paternal Grandmother    Diabetes Maternal Aunt    Diabetes Maternal Uncle    Cancer Other    Breast cancer Neg Hx     Social History:  reports that she has been smoking cigarettes. She has a 30.00 pack-year smoking history. She uses smokeless tobacco. She reports current alcohol use. She reports current drug use. Drug: Marijuana.  Allergies:  Allergies  Allergen Reactions   Piper Other (See Comments)    Black pepper Feels like throat is closing, itchy Feels like throat is closing, itchy Feels like throat is closing, itchy   Tape Rash    Paper tape is ok to use.    Medications: I have reviewed the patient's current medications.  Results for orders placed or performed during the hospital encounter of  03/10/22 (from the past 48 hour(s))  Hemoglobin and hematocrit, blood     Status: Abnormal   Collection Time: 03/28/22 10:40 PM  Result Value Ref Range   Hemoglobin 8.4 (L) 12.0 - 15.0 g/dL    Comment: REPEATED TO VERIFY POST TRANSFUSION SPECIMEN    HCT 24.2 (L) 36.0 - 46.0 %    Comment: Performed at [facility] [address], [place], [place] [address]  Glucose, capillary     Status: Abnormal   Collection Time: 03/28/22 11:31 PM  Result Value Ref Range   Glucose-Capillary 104 (H) 70 - 99 mg/dL    Comment: Glucose reference range applies only to samples taken after fasting for at least 8 hours.   Comment 1 Notify RN   Renal function panel     Status: Abnormal   Collection Time: 03/29/22  2:55 AM  Result Value Ref Range   Sodium 136 135 - 145 mmol/L   Potassium 4.6 3.5 - 5.1 mmol/L   Chloride 107 98 - 111 mmol/L   CO2 17 (L) 22 - 32 mmol/L   Glucose, Bld 157 (H) 70 - 99 mg/dL    Comment: Glucose reference range applies only to samples taken after fasting for at least 8 hours.   BUN 72 (H) 6 - 20 mg/dL   Creatinine, Ser 4.19 (H) 0.44 - 1.00 mg/dL   Calcium 8.9 8.9 - 10.3  mg/dL   Phosphorus 6.8 (H) 2.5 - 4.6 mg/dL   Albumin 1.9 (L) 3.5 - 5.0 g/dL   GFR, Estimated 12 (L) >60 mL/min    Comment: (NOTE) Calculated using the CKD-EPI Creatinine Equation (2021)    Anion gap 12 5 - 15    Comment: Performed at [facility] [address] [place], [place] [address]  CBC     Status: Abnormal   Collection Time: 03/29/22  2:55 AM  Result Value Ref Range   WBC 19.9 (H) 4.0 - 10.5 K/uL   RBC 2.34 (L) 3.87 - 5.11 MIL/uL   Hemoglobin 7.0 (L) 12.0 - 15.0 g/dL   HCT 20.7 (L) 36.0 - 46.0 %   MCV 88.5 80.0 - 100.0 fL   MCH 29.9 26.0 - 34.0 pg   MCHC 33.8 30.0 - 36.0 g/dL   RDW 18.5 (H) 11.5 - 15.5 %   Platelets 305 150 - 400 K/uL   nRBC 0.6 (H) 0.0 - 0.2 %    Comment: Performed at [facility], [place] [address], [place], [place] [address]  Prepare RBC (crossmatch)      Status: None   Collection Time: 03/29/22  3:28 AM  Result Value Ref Range   Order Confirmation      ORDER PROCESSED BY BLOOD BANK Performed at [facility], [place] [address] [place], [place] [address]   Glucose, capillary     Status: Abnormal   Collection Time: 03/29/22  3:39 AM  Result Value Ref Range   Glucose-Capillary 164 (H) 70 - 99 mg/dL    Comment: Glucose reference range applies only to samples taken after fasting for at least 8 hours.   Comment 1 Notify RN   Hemoglobin and hematocrit, blood     Status: Abnormal   Collection Time: 03/29/22  6:30 AM  Result Value Ref Range   Hemoglobin 7.4 (L) 12.0 - 15.0 g/dL   HCT 21.0 (L) 36.0 - 46.0 %    Comment: Performed at [facility], [place] [address] [place], [place] [address]  Prepare RBC (crossmatch)     Status: None   Collection Time: 03/29/22  7:35 AM  Result Value Ref Range   Order Confirmation      ORDER PROCESSED BY BLOOD BANK Performed at [facility], [place] [address] [place], [place] [address]   Glucose, capillary     Status: Abnormal   Collection Time: 03/29/22  8:33 AM  Result Value Ref Range   Glucose-Capillary 124 (H) 70 - 99 mg/dL    Comment: Glucose reference range applies only to samples taken after fasting for at least 8 hours.  Glucose, capillary     Status: Abnormal   Collection Time: 03/29/22 12:42 PM  Result Value Ref Range   Glucose-Capillary 157 (H) 70 - 99 mg/dL    Comment: Glucose reference range applies only to samples taken after fasting for at least 8 hours.  DIC Panel ONCE - STAT     Status: Abnormal   Collection Time: 03/29/22  1:46 PM  Result Value Ref Range   Prothrombin Time 16.3 (H) 11.4 - 15.2 seconds   INR 1.3 (H) 0.8 - 1.2    Comment: (NOTE) INR goal varies based on device and disease states.    aPTT 27 24 - 36 seconds   Fibrinogen 401 210 - 475 mg/dL    Comment: (NOTE) Fibrinogen results may be underestimated in patients receiving thrombolytic therapy.    D-Dimer,  Quant 2.39 (H) 0.00 - 0.50 ug/mL-FEU    Comment: (NOTE) At the manufacturer cut-off value of 0.5 g/mL FEU, this assay has a negative predictive value of 95-100%.This assay is intended for use in conjunction with a clinical pretest probability (PTP) assessment model to exclude pulmonary embolism (PE) and deep venous thrombosis (DVT) in outpatients suspected of PE or DVT. Results should be correlated with clinical presentation.    Platelets 227 150 - 400 K/uL   Smear Review NO SCHISTOCYTES SEEN     Comment: Performed at [facility], [place] [address], [place], [place] [address]  CBC     Status: Abnormal   Collection Time: 03/29/22  4:01 PM  Result Value Ref Range   WBC 16.4 (H) 4.0 - 10.5 K/uL   RBC 2.76 (L) 3.87 - 5.11 MIL/uL   Hemoglobin 8.2 (L) 12.0 - 15.0 g/dL   HCT 24.0 (L) 36.0 - 46.0 %   MCV 87.0 80.0 - 100.0 fL   MCH 29.7 26.0 - 34.0 pg   MCHC 34.2 30.0 - 36.0 g/dL   RDW 17.7 (H) 11.5 - 15.5 %   Platelets 248 150 - 400 K/uL   nRBC 0.9 (H) 0.0 - 0.2 %    Comment: Performed at [facility], [place] [address], [place], [place] [address]  Glucose, capillary     Status: Abnormal   Collection Time: 03/29/22  4:10 PM  Result Value Ref Range   Glucose-Capillary 142 (H) 70 - 99 mg/dL    Comment: Glucose reference range applies only to samples taken after fasting for at least 8 hours.  Glucose, capillary     Status: Abnormal   Collection Time: 03/29/22  7:27 PM  Result Value Ref Range   Glucose-Capillary 105 (H) 70 - 99 mg/dL    Comment: Glucose reference range applies only to samples taken after fasting for at least 8 hours.  CBC with Differential/Platelet     Status: Abnormal   Collection Time: 03/29/22 10:07 PM  Result Value Ref Range   WBC 13.9 (H) 4.0 - 10.5 K/uL   RBC 2.24 (L) 3.87 - 5.11 MIL/uL   Hemoglobin 6.8 (LL) 12.0 - 15.0 g/dL    Comment: REPEATED TO VERIFY THIS CRITICAL RESULT HAS VERIFIED AND BEEN CALLED TO BRITTANY JOHNSON RN BY SHAMEKA WALKER ON 01 19  2024 AT 2252, AND HAS BEEN READ BACK.     HCT 19.7 (L) 36.0 - 46.0 %   MCV 87.9 80.0 - 100.0 fL   MCH 30.4 26.0 - 34.0 pg   MCHC 34.5 30.0 - 36.0 g/dL   RDW 18.4 (H) 11.5 - 15.5 %   Platelets 224 150 - 400 K/uL   nRBC 0.8 (H) 0.0 - 0.2 %   Neutrophils Relative % 81 %   Neutro Abs 11.3 (H) 1.7 - 7.7 K/uL   Lymphocytes Relative 14 %   Lymphs Abs 1.9 0.7 - 4.0 K/uL   Monocytes Relative 3 %   Monocytes Absolute 0.4 0.1 - 1.0 K/uL   Eosinophils Relative 0 %   Eosinophils Absolute 0.0 0.0 - 0.5 K/uL   Basophils Relative 0 %   Basophils Absolute 0.0 0.0 - 0.1 K/uL   nRBC 0 0 /100 WBC   Promyelocytes Relative 2 %   Abs Immature Granulocytes 0.30 (H) 0.00 - 0.07 K/uL   Polychromasia PRESENT     Comment: Performed at [facility], [address] [place], [place] [address]  Basic metabolic panel  Status: Abnormal   Collection Time: 03/29/22 10:07 PM  Result Value Ref Range   Sodium 134 (L) 135 - 145 mmol/L   Potassium 4.8 3.5 - 5.1 mmol/L   Chloride 109 98 - 111 mmol/L   CO2 14 (L) 22 - 32 mmol/L   Glucose, Bld 120 (H) 70 - 99 mg/dL    Comment: Glucose reference range applies only to samples taken after fasting for at least 8 hours.   BUN 63 (H) 6 - 20 mg/dL   Creatinine, Ser 4.00 (H) 0.44 - 1.00 mg/dL   Calcium 8.2 (L) 8.9 - 10.3 mg/dL   GFR, Estimated 13 (L) >60 mL/min    Comment: (NOTE) Calculated using the CKD-EPI Creatinine Equation (2021)    Anion gap 11 5 - 15    Comment: Performed at [facility] [address] [place], [place] [address]  Glucose, capillary     Status: Abnormal   Collection Time: 03/29/22 11:29 PM  Result Value Ref Range   Glucose-Capillary 117 (H) 70 - 99 mg/dL    Comment: Glucose reference range applies only to samples taken after fasting for at least 8 hours.  Glucose, capillary     Status: Abnormal   Collection Time: 03/30/22  3:20 AM  Result Value Ref Range   Glucose-Capillary 112 (H) 70 - 99 mg/dL    Comment: Glucose reference  range applies only to samples taken after fasting for at least 8 hours.  Renal function panel     Status: Abnormal   Collection Time: 03/30/22  3:48 AM  Result Value Ref Range   Sodium 137 135 - 145 mmol/L   Potassium 5.3 (H) 3.5 - 5.1 mmol/L   Chloride 111 98 - 111 mmol/L   CO2 14 (L) 22 - 32 mmol/L   Glucose, Bld 113 (H) 70 - 99 mg/dL    Comment: Glucose reference range applies only to samples taken after fasting for at least 8 hours.   BUN 63 (H) 6 - 20 mg/dL   Creatinine, Ser 4.24 (H) 0.44 - 1.00 mg/dL   Calcium 8.5 (L) 8.9 - 10.3 mg/dL   Phosphorus 8.0 (H) 2.5 - 4.6 mg/dL   Albumin <1.5 (L) 3.5 - 5.0 g/dL   GFR, Estimated 12 (L) >60 mL/min    Comment: (NOTE) Calculated using the CKD-EPI Creatinine Equation (2021)    Anion gap 12 5 - 15    Comment: Performed at [facility] [address] [place] [address]  CBC     Status: Abnormal   Collection Time: 03/30/22  3:49 AM  Result Value Ref Range   WBC 10.4 4.0 - 10.5 K/uL   RBC 2.12 (L) 3.87 - 5.11 MIL/uL   Hemoglobin 6.4 (LL) 12.0 - 15.0 g/dL    Comment: REPEATED TO VERIFY THIS CRITICAL RESULT HAS VERIFIED AND BEEN CALLED TO ABBY JANBARI RN BY SHAMEKA WALKER ON 01 20 2024 AT 7893, AND HAS BEEN READ BACK.     HCT 18.7 (L) 36.0 - 46.0 %   MCV 88.2 80.0 - 100.0 fL   MCH 30.2 26.0 - 34.0 pg   MCHC 34.2 30.0 - 36.0 g/dL   RDW 18.4 (H) 11.5 - 15.5 %   Platelets 226 150 - 400 K/uL   nRBC 0.8 (H) 0.0 - 0.2 %    Comment: Performed at [facility] [address], [place], [place] [address]  Prepare RBC (crossmatch)     Status: None   Collection  Time: 03/30/22  6:30 AM  Result Value Ref Range   Order Confirmation      ORDER PROCESSED BY BLOOD BANK Performed at [facility], [place] [address], [place], [place] [address]   Glucose, capillary     Status: Abnormal   Collection Time: 03/30/22  7:48 AM  Result Value Ref Range   Glucose-Capillary 119 (H) 70 - 99 mg/dL    Comment: Glucose reference range  applies only to samples taken after fasting for at least 8 hours.  Glucose, capillary     Status: None   Collection Time: 03/30/22 12:38 PM  Result Value Ref Range   Glucose-Capillary 79 70 - 99 mg/dL    Comment: Glucose reference range applies only to samples taken after fasting for at least 8 hours.  Glucose, capillary     Status: None   Collection Time: 03/30/22 12:55 PM  Result Value Ref Range   Glucose-Capillary 80 70 - 99 mg/dL    Comment: Glucose reference range applies only to samples taken after fasting for at least 8 hours.  CBC     Status: Abnormal   Collection Time: 03/30/22  1:13 PM  Result Value Ref Range   WBC 14.5 (H) 4.0 - 10.5 K/uL   RBC 2.46 (L) 3.87 - 5.11 MIL/uL   Hemoglobin 7.4 (L) 12.0 - 15.0 g/dL   HCT 21.6 (L) 36.0 - 46.0 %   MCV 87.8 80.0 - 100.0 fL   MCH 30.1 26.0 - 34.0 pg   MCHC 34.3 30.0 - 36.0 g/dL   RDW 17.6 (H) 11.5 - 15.5 %   Platelets 238 150 - 400 K/uL   nRBC 1.2 (H) 0.0 - 0.2 %    Comment: Performed at [facility] [address] [place], [place] [address]  Glucose, capillary     Status: Abnormal   Collection Time: 03/30/22  3:33 PM  Result Value Ref Range   Glucose-Capillary 147 (H) 70 - 99 mg/dL    Comment: Glucose reference range applies only to samples taken after fasting for at least 8 hours.  Glucose, capillary     Status: Abnormal   Collection Time: 03/30/22  7:31 PM  Result Value Ref Range   Glucose-Capillary 57 (L) 70 - 99 mg/dL    Comment: Glucose reference range applies only to samples taken after fasting for at least 8 hours.  Glucose, capillary     Status: Abnormal   Collection Time: 03/30/22  7:58 PM  Result Value Ref Range   Glucose-Capillary 117 (H) 70 - 99 mg/dL    Comment: Glucose reference range applies only to samples taken after fasting for at least 8 hours.    DG Abd Portable 1V  Result Date: 03/30/2022 CLINICAL DATA:  OG tube placement EXAM: PORTABLE ABDOMEN - 1 VIEW COMPARISON:  CTA GI bleed 03/29/2022  FINDINGS: PICC line tip projects over the superior vena cava. Enteric tube tip projects over the left upper quadrant. Cholecystectomy clips. Gaseous distended loops of bowel throughout the visualized upper abdomen. IMPRESSION: Enteric tube tip projects over the left upper quadrant. Electronically Signed   By: Lovey Newcomer M.D.   On: 03/30/2022 17:11   CT ANGIO GI BLEED  Result Date: 03/29/2022 CLINICAL DATA:  57 year old female with lower GI bleed. EXAM: CTA ABDOMEN AND PELVIS WITHOUT AND WITH CONTRAST TECHNIQUE: Multidetector CT imaging of the abdomen and pelvis was performed using the standard protocol during bolus administration of intravenous contrast. Multiplanar reconstructed images and MIPs were obtained  and reviewed to evaluate the vascular anatomy. RADIATION DOSE REDUCTION: This exam was performed according to the departmental dose-optimization program which includes automated exposure control, adjustment of the mA and/or kV according to patient size and/or use of iterative reconstruction technique. CONTRAST:  59mL OMNIPAQUE IOHEXOL 350 MG/ML SOLN COMPARISON:  CT abdomen and pelvis with oral contrast on 03/29/2022 and CT from 03/15/2022 FINDINGS: VASCULAR Aorta: Minimal atherosclerotic disease in the abdominal aorta without aneurysm, dissection or significant stenosis. Celiac: Patent without evidence of aneurysm, dissection, vasculitis or significant stenosis. SMA: Patent without evidence of aneurysm, dissection, vasculitis or significant stenosis. Renals: Both renal arteries are patent without evidence of aneurysm, dissection, vasculitis, fibromuscular dysplasia or significant stenosis. IMA: Patent without evidence of aneurysm, dissection, vasculitis or significant stenosis. Inflow: Patent without evidence of aneurysm, dissection, vasculitis or significant stenosis. Proximal Outflow: Proximal femoral arteries are patent bilaterally. Veins: Portal venous system is patent. IVC and renal veins are patent.  Main iliac veins are patent. Proximal femoral veins are patent. Review of the MIP images confirms the above findings. NON-VASCULAR Lower chest: Residual patchy densities in the right middle lobe. No pleural effusions. Hepatobiliary: Cholecystectomy. Normal appearance of the liver without biliary dilatation. Pancreas: Unremarkable. No pancreatic ductal dilatation or surrounding inflammatory changes. Spleen: Normal in size without focal abnormality. Adrenals/Urinary Tract: Normal adrenal glands. At least 2 small left renal calculi, largest measures 2 mm. 3 mm calculus in the right kidney the upper/mid pole region. Probable cyst in the right kidney lower pole that does not require dedicated follow-up. Questionable 6 mm fat attenuating structure along the right kidney upper pole on image 44/5. This could be related to a cortical scar/cleft versus a small fat containing structure such as a tiny angiomyolipoma. This is not significantly changed since 2011 and does not require dedicated follow-up. No hydronephrosis. Urinary bladder is decompressed with a Foley catheter. No ureter dilatation. Stomach/Bowel: Nasogastric tube terminates in the stomach body. Limited evaluation for active GI bleeding because there is oral contrast in the small and large bowel. Mild focal wall thickening in the colon near the splenic flexure on image 53/54. Mild pericolonic stranding involving the ascending colon. There is contrast within the normal appearing appendix. Contrast in the distal small bowel without bowel dilatation or obstruction. Normal appearance of the stomach. Lymphatic: Again noted are small periaortic lymph nodes in the abdomen. No significant lymph node enlargement in the abdomen or pelvis. Reproductive: Uterus appears to be surgically absent. No evidence for an adnexal mass. Other: Diffuse subcutaneous edema. Trace fluid in the pelvis and perihepatic region. Negative for free air. Musculoskeletal: No acute bone abnormality.  Scattered subcutaneous densities most likely related to prior injection sites. IMPRESSION: VASCULAR 1. Minimal atherosclerotic disease in the abdomen or pelvis. Aorta and main visceral arteries are patent without significant stenosis. NON-VASCULAR 1. Very limited evaluation for active GI bleeding because there was oral contrast within the distal small bowel and throughout the colon. Active GI bleeding cannot be assessed on this examination. 2. Residual mild colonic wall thickening most prominent near the splenic fracture. There may be mild colonic wall thickening in the right colon. Findings are compatible with residual or persistent colitis. 3. Nonobstructive renal calculi. 4. Diffuse subcutaneous edema. Electronically Signed   By: Markus Daft M.D.   On: 03/29/2022 14:19   CT ABDOMEN PELVIS WO CONTRAST  Result Date: 03/29/2022 CLINICAL DATA:  Acute, nonlocalized abdominal pain EXAM: CT ABDOMEN AND PELVIS WITHOUT CONTRAST TECHNIQUE: Multidetector CT imaging of the abdomen and pelvis was  performed following the standard protocol without IV contrast. RADIATION DOSE REDUCTION: This exam was performed according to the departmental dose-optimization program which includes automated exposure control, adjustment of the mA and/or kV according to patient size and/or use of iterative reconstruction technique. COMPARISON:  03/15/2022 FINDINGS: Lower chest: Patchy infiltrate in the lower lungs is likely improved. Hepatobiliary: No focal liver abnormality.Cholecystectomy without bile duct dilatation. Pancreas: Unremarkable. Spleen: Unremarkable. Adrenals/Urinary Tract: Negative adrenals. No hydronephrosis or ureteral stone. At least 2 renal calculi on both sides measuring up to 2 mm. The bladder is collapsed by Foley catheter. Stomach/Bowel: Diffuse colonic fluid levels reaching the rectum. Colonic wall thickening is improved. No bowel obstruction and no small bowel dilatation to imply ileus. An enteric tube tip is at the  stomach. Vascular/Lymphatic: No acute vascular abnormality. Scattered atheromatous calcification of the aorta and iliacs. Reproductive:No acute finding Other: Decrease in pelvic fluid, essentially resolved. Musculoskeletal: No acute or aggressive finding. IMPRESSION: 1. Diarrheal illness with fluid reaching the rectum. Colitic wall thickening is improved from prior. 2. Improving bilateral pneumonia. 3. Bilateral nephrolithiasis without obstruction. Electronically Signed   By: Jorje Guild M.D.   On: 03/29/2022 07:11    ROS 10 point review of systems is negative except as listed above in HPI.   Physical Exam Blood pressure (!) 102/49, pulse 96, temperature 99.7 F (37.6 C), temperature source Bladder, resp. rate 17, height 4\' 10"  (1.473 m), weight 77.6 kg, SpO2 98 %. Constitutional: well-developed, well-nourished HEENT: pupils equal, round, reactive to light, 28mm b/l, moist conjunctiva, external inspection of ears and nose normal, hearing intact Oropharynx: normal oropharyngeal mucosa, poor dentition Neck: no thyromegaly, trachea midline, no midline cervical tenderness to palpation Chest: breath sounds equal bilaterally, normal respiratory effort, no midline or lateral chest wall tenderness to palpation/deformity Abdomen: soft, b/l LQ bruising and TTP, no hepatosplenomegaly GU: normal female genitalia  Skin: warm, dry, no rashes Psych:  limited engagment, bu answers questions appropriately       Assessment/Plan: 33F with diffuse colonic ulcers and LGIB without identifiable source. Recommend continued workup to localize source of bleeding to include additional bleeding scan and/or arteriogram. Without localization of bleeding, I would recommend total abdominal colectomy with end ileostomy, which would be a highly morbid procedure and would also not be a guarantee for hemorrhage control. She would be at high risk for peri-operative surgical complications as well as fluid and electrolyte  maintenance with an ileostomy. Her risk factors for TAC with ileostomy are listed below and should be discussed in detail with the patient and family if localization of bleeding cannot be obtained. I pointedly discussed with the patient and her family that non-operative strategies will likely induce recurrent AKI that may convert to CKD long-term and that this is a risk I believe should be undertaken to avoid the morbidity and mortality risk of such a significant operation as TAC.        Jesusita Oka, MD General and [facility]

## 2022-03-30 NOTE — Progress Notes (Addendum)
[facility] [facility] Progress Note Patient Name: SARAE NICHOLES DOB: Aug 23, 1965 MRN: [id number]   Date of Service  03/30/2022  HPI/Events of Note  PT had coretrak taken out today for UGI, just making sure pt needs to stay NPO            See NP notes from 6:50pm       May need cont dextrose, CBG 57 and was tx.  Camera: Awake and alert, stable Vitals.  CBG 57 to 117, post D50 at 7.34 PM  GI discussed colonoscopy finding to include; multiple large clean based ulcers found in the sigmoid colon, in the descending colon, in the transverse colon, at the hepatic flexure and in the ascending colon. Ulcers were deeper and larger in the right colon. Could not see any with stigmata for bleeding, however could not clear right colon / cecum due to clot burdern. Dr. Bobbye Morton who recommended optimizing medical therapies prior to proceeding with extensive surgery.   If signs of re bleed, repeat CT angio with risk to kidneys, family is aware per notes.    eICU Interventions  D5 ordered for tonight, ig CBG > 180 x twice to stop dextrose.      Intervention Category Intermediate Interventions: Other: (hypoglycemia)  Elmer Sow 03/30/2022, 8:24 PM  21:56 Notified about another bloody stool-right red black.  Discussed with bed side RN.  Camera: VS stable, no tachycardia. MAP > 65.  - hemodynamically stable. Get stat CBC. If dropping hemoglobin, to go for CTA again, see my notes from earlier. -INR/PTT/PT. Stat - one PRBC stat ordered also, got earlier 1 unit  2300 Hg 6.7, dropped < 7.  See above. INR 1.4.  PRBC started, to complete in 2 hrs and . Follow Hg post. If re bleeding and Hg not better, to go for CTA abdomen again.   0947 there are orders for continous protontiix drip but it was stopped this am, do you want this restarted. EGD neg for any bleeding. Ordered protonix 40 q12 hr for now, no continuous drip needed, unless GI advices otherwise in AM.    01:59 Camera: Discussed with  RN and son at bed side. Patient is awake and alert also. LGI -now frank red large bleeding again, transfused PRBC earlier. Hg to 7.5 post transfusion. MAP good, HR 112. INR 1.4.  Cr > 4. discussed with family by me and earlier in day by NP. agrees for stat CTA abdomen.risk with risk for contrast induced Kidney injury . transfusin again. FFP once.    Start NS at 100 ml/hr to reduce contrast injury.  Bed side CCM is made aware of these new changes. If hemodynamic instability, might need to go to OR-Gen surgery consultation stat.   04:17 CTA abdomen: IMPRESSION: 1. No active gastrointestinal hemorrhage identified. 2. Multifocal infectious or inflammatory colitis involving the splenic flexure of the colon and ascending colon. No evidence of obstruction or perforation. 3. Fluid-filled colon and rectal vault in keeping with a diarrheal state. 4. Patchy bibasilar ground-glass pulmonary infiltrate, likely infectious or inflammatory but incompletely evaluated on this examination. 5. Mild bilateral nonobstructing nephrolithiasis. 6. Aortic atherosclerosis.   On zosyn. Follow cultures. Continue current care plan.  04:58 Bleeding reduced but still ongoing. Getting 2 nd PRBC, then FFP. Follow Hg post. Surgery notes from 20th reviewed. GI and Gen surgery to follow up in AM.  Risk of TAC reviewed.   Again has fresh red blood bowel movement.  Will try to connect to surgeon and update.  5:42 discussed with Surgeon Dr Jamas Lav. she is buzy with multiple emergencies, continue resuscitation with blood, she will talk to family for TAC. risk and benefits.

## 2022-03-30 NOTE — Progress Notes (Signed)
Hypoglycemic Event  CBG: 57  Treatment: D50 25 mL (12.5 gm)  Symptoms: None  Follow-up CBG: FVOH:[id number] CBG Result:117  Possible Reasons for Event: Inadequate meal intake    Darlene Barber

## 2022-03-30 NOTE — Op Note (Signed)
[facility] Patient Name: Darlene Barber Procedure Date : 03/30/2022 MRN: [id number] Attending MD: [name] , MD, [id number] Date of Birth: December 25, 1965 CSN: [id number] Age: 57 Admit Type: Inpatient Procedure:                Upper GI endoscopy Indications:              Recent gastrointestinal bleeding - colon filled                            with blood on colonoscopy, ensure no upper tract                            source Providers:                [name], MD, Janee Morn,                            Technician Referring MD:              Medicines:                Monitored Anesthesia Care Complications:            No immediate complications. Estimated blood loss:                            None. Estimated Blood Loss:     Estimated blood loss: none. Procedure:                Pre-Anesthesia Assessment:                           - Prior to the procedure, a History and Physical                            was performed, and patient medications and                            allergies were reviewed. The patient's tolerance of                            previous anesthesia was also reviewed. The risks                            and benefits of the procedure and the sedation                            options and risks were discussed with the patient.                            All questions were answered, and informed consent                            was obtained. Prior Anticoagulants: The patient has                            taken no anticoagulant or antiplatelet  agents. ASA                            Grade Assessment: IV - A patient with severe                            systemic disease that is a constant threat to life.                            After reviewing the risks and benefits, the patient                            was deemed in satisfactory condition to undergo the                            procedure.                            After obtaining informed consent, the endoscope was                            passed under direct vision. Throughout the                            procedure, the patient's blood pressure, pulse, and                            oxygen saturations were monitored continuously. The                            GIF-H190 ([id number]) Olympus endoscope was introduced                            through the mouth, and advanced to the second part                            of duodenum. The upper GI endoscopy was                            accomplished without difficulty. The patient                            tolerated the procedure well. Scope In: Scope Out: Findings:      There was a Dobhoff tube that extended from the esophagus into the       gastric body. The examined esophagus was normal.      The entire examined stomach was normal.      The duodenal bulb and second portion of the duodenum were normal. Upon       withdrawal the Dobhoff tube started withdrawing, a snare was used to       drag it back into the stomach. Impression:               - Normal esophagus.                           -  Normal stomach.                           - Normal duodenal bulb and second portion of the                            duodenum.                           No upper tract source for bleeding Recommendation:           - See colonoscopy note for details / post procedure                            recommendations Procedure Code(s):        --- Professional ---                           [phone], Esophagogastroduodenoscopy, flexible,                            transoral; diagnostic, including collection of                            specimen(s) by brushing or washing, when performed                            (separate procedure) Diagnosis Code(s):        --- Professional ---                           K92.2, Gastrointestinal hemorrhage, unspecified CPT copyright 2022 American Medical Association. All rights  reserved. The codes documented in this report are preliminary and upon coder review may  be revised to meet current compliance requirements. Remo Lipps P. Shanita Kanan, MD 03/30/2022 12:01:05 PM This report has been signed electronically. Number of Addenda: 0

## 2022-03-30 NOTE — Progress Notes (Signed)
[place] Progress Note Patient Name: Darlene Barber DOB: Jan 15, 1966 MRN: [id number]   Date of Service  03/30/2022  HPI/Events of Note  Hemoglobin 6.4 gm / dl, no evidence of active bleeding.  eICU Interventions  One unit of PRBC ordered transfused.        Frederik Pear 03/30/2022, 6:27 AM

## 2022-03-30 NOTE — Anesthesia Preprocedure Evaluation (Addendum)
Anesthesia Evaluation  Patient identified by MRN, date of birth, ID band Patient awake and Patient confused    Reviewed: Allergy & Precautions, NPO status , [name] Chart, lab work & pertinent test results  Airway Mallampati: II  TM Distance: >3 FB Neck ROM: Full    Dental  (+) Dental Advisory Given   Pulmonary sleep apnea , pneumonia, COPD, Current Smoker   breath sounds clear to auscultation       Cardiovascular hypertension,  Rhythm:Regular Rate:Normal     Neuro/Psych  Headaches  Neuromuscular disease    GI/Hepatic Neg liver ROS,GERD  ,,  Endo/Other  diabetes    Renal/GU Renal disease     Musculoskeletal  (+) Arthritis ,    Abdominal   Peds  Hematology  (+) Blood dyscrasia, anemia   Anesthesia Other Findings   Reproductive/Obstetrics                             Anesthesia Physical Anesthesia Plan  ASA: 4  Anesthesia Plan: MAC   Post-op Pain Management:    Induction:   PONV Risk Score and Plan: 1 and Propofol infusion and Ondansetron  Airway Management Planned: Natural Airway and Simple Face Mask  Additional Equipment:   Intra-op Plan:   Post-operative Plan:   Informed Consent: I have reviewed the patients History and Physical, chart, labs and discussed the procedure including the risks, benefits and alternatives for the proposed anesthesia with the patient or authorized representative who has indicated his/her understanding and acceptance.       Plan Discussed with:   Anesthesia Plan Comments:        Anesthesia Quick Evaluation

## 2022-03-30 NOTE — Progress Notes (Signed)
NAME:  CORRIE REDER, MRN:  [id number], DOB:  10-31-1965, LOS: 31 ADMISSION DATE:  03/10/2022, CONSULTATION DATE:  12/30  REFERRING MD:  Dr. Cinda Quest, CHIEF COMPLAINT:  Abdominal Pain    History of Present Illness:  [name] who presented to the [facility] ER on 12/31 with reports of abdominal pain and widespread rash.  On admission, the patient significant other reported that the day after eating at a local restaurant in [place], [place] she developed abdominal pain and rash.  She was reportedly up all night with crampy abdominal pain associated with nausea and vomiting.  Around 2 PM on the day of admission she noticed a rash all over her body and had worsening pain prompting him to call EMS.  She was evaluated in the emergency room and was found to have oxygen saturations of 87% on 2 L, afebrile with an elevated white blood cell count of 29.2.  Additional labs notable for acute kidney injury, anion gap metabolic acidosis and LFTs with mixed hepatic and cholestatic picture.  Stool samples were sent with workup of acute diarrhea.  Stool pathogen panel was negative.  C. difficile toxin was negative.  She was treated with 30 mL/kg IV fluid and started on empiric broad-spectrum antibiotics with vancomycin, cefepime and Flagyl.  She remained hypotensive despite IV fluid boluses and was started on vasopressors.  She was transferred to [facility] as there were no inpatient beds at [facility].    PCCM consulted for ICU admission.  Pertinent  Medical History  Chronic Pain Syndrome  Cervical Spondylitis  GERD  Bipolar Disorder  Tobacco Abuse - daily  THC Abuse - daily   Significant Hospital Events: Including procedures, antibiotic start and stop dates in addition to other pertinent events   12/31 admit with abdominal pain, diarrhea.  Left IJ TLC placement.  UDS positive for tricyclics, THC and amphetamines.  C. difficile negative, GI PCR negative.  COVID, flu, RSV negative.  Antibiotics narrowed to Zosyn.  BC positives for strep infantarius.  CT chest abdomen pelvis with fluid throughout the stomach, small bowel and colon, nonspecific but may represent diarrheal state.  No bowel obstruction, definite bowel wall thickening or pneumoperitoneum.  Fluid within the esophagus may represent reflux or nonspecific esophageal motility disorder.  Trace amount of free pelvic fluid, nonspecific.   1/3 CRRT, PEEP 5/60% 1/4 CRRT with negative balance. Precedex added to Fentanyl.  1/5 CT chest abd pelvis, TEE done with no evidence of vegetations 1/8 Taken off CRRT, MIVF started 1/9 too wild on precedex, severe HTN prevented extubation 1/10 restarted on CRRT 1/11 extubated, transferred from [facility] for iHD 1/13 Clonazepam 0.5 twice daily, Seroquel to twice daily 1/14 Precedex weaned to off this morning 03/29/2022 critical care called back for new GI bleed transferred back to intensive care unit GI has been consulted 1/20 continue GI bleed overnight, hemoglobin dropped to 6.41 unit PRBCs ordered  Interim History / Subjective:  Seen sitting up in bed encephalopathic  Objective   Blood pressure 105/62, pulse (!) 106, temperature 99.7 F (37.6 C), resp. rate (!) 21, height 4\' 10"  (1.473 m), weight 77.6 kg, SpO2 92 %.        Intake/Output Summary (Last 24 hours) at 03/30/2022 0818 Last data filed at 03/30/2022 0200 Gross per 24 hour  Intake 904.52 ml  Output --  Net 904.52 ml    Filed Weights   03/25/22 1255 03/27/22 0500 03/28/22 0500  Weight: 77 kg 77.4 kg 77.6 kg  Examination: General: Acute on chronic deconditioned elderly female lying in bed in no acute distress HEENT: [place]/AT, MM pink/moist, PERRL,  Neuro: Alert and oriented x 1-2, confused to situation, nonfocal CV: s1s2 regular rate and rhythm, no murmur, rubs, or gallops,  PULM: Clear to auscultation bilaterally, no increased work of breathing, no added breath sounds GI: soft, bowel sounds active in all 4 quadrants, non-tender,  non-distended Extremities: warm/dry, no edema  Skin: no rashes or lesions  Resolved Hospital Problem list   Hypotension - sepsis + sedation  Transaminitis Shock Liver Rash, Hives TCP related to CRRT, sepsis Multifocal pneumonia, possible VAP, completed course 1/11 Possible melena  Assessment & Plan:  New GI bleeding Acute blood loss anemia superimposed on normocytic anemia  P: GI with to perform upper EGD this am  Trend CBC  Transfuse per protocol  Hgb goal > 7 NPO  Maintain 2 large bore PIVs Continue IV Protonix   Acute Metabolic / Toxic Encephalopathy  -Remains confused, awakens to voice and tracking but not following commands or speaking. Likely related to sedation in setting of poor renal function. P: Back on IV Precedex  Continue scheduled Seroquel  PRN Haldol  Maintain neuro protective measures Nutrition and bowel regiment  Seizure precautions  Aspirations precautions   Renal Failure, likely ATN   -SP CRRT 1/02- 1/11. Tx'd to [facility] 1/11  P: Nephrology following, appreciate assistance Further iHF per nephrology  Follow renal function  Monitor urine output Trend Bmet Avoid nephrotoxins Ensure adequate renal perfusion  UDS Positive: Amphetamines, Cannabis, TCA's  P: Cessation counseling when appropriate   Bipolar Disorder Chronic Pain  P: Continue Vilazodone and Seroquel   Hyperglycemia P: SSI and TF coverage  CBG goal 140-180  HTN -Home medications include Norvasc, aspirin, P: Hold home medications in the setting of GI bleed Continuous telemetry  At Risk Malnutrition  Hypoalonemia  P: Resume tube feeds once cleared by GI  Hyperkalemia  P: Trend Biochemist, clinical (right click and "Reselect all SmartList Selections" daily)  Diet/type: tubefeeds  DVT prophylaxis: prophylactic heparin  GI prophylaxis: PPI Lines: Central line and Dialysis Catheter (PICC 1/10-, R fem HD cath 1/2-)   Foley:  Yes, and it is still needed Code Status:   full code Last date of multidisciplinary goals of care discussion: Discussed with son POA Merideth Bosque 01/28/23  CRITICAL CARE Performed by: Arelia Volpe D. Harris  Total critical care time: 38 minutes  Critical care time was exclusive of separately billable procedures and treating other patients.  Critical care was necessary to treat or prevent imminent or life-threatening deterioration.  Critical care was time spent personally by me on the following activities: development of treatment plan with patient and/or surrogate as well as nursing, discussions with consultants, evaluation of patient's response to treatment, examination of patient, obtaining history from patient or surrogate, ordering and performing treatments and interventions, ordering and review of laboratory studies, ordering and review of radiographic studies, pulse oximetry and re-evaluation of patient's condition.  Johann Santone D. Harris, NP-C [facility] Personal contact information can be found on Amion  If no contact or response made please call [phone] 03/30/2022, 8:22 AM

## 2022-03-30 NOTE — Interval H&P Note (Signed)
History and Physical Interval Note: CTA negative yesterday, fortunately her bleeding slowed but has not stopped. Did prep overnight via Dobhoff, Hgb drifted and received more blood this AM. Colonoscopy to further evaluate for source of bleeding. If negative, will look with EGD to clear upper tract but hopefully that is not needed, I think more than likely colon is the source. Family has provided consent after discussion of risks / benefits.   03/30/2022 10:56 AM  Darlene Barber  has presented today for surgery, with the diagnosis of rectal bleeding.  The various methods of treatment have been discussed with the patient and family. After consideration of risks, benefits and other options for treatment, the patient has consented to  Procedure(s): COLONOSCOPY WITH PROPOFOL (N/A) with possible EGD as a surgical intervention.  The patient's history has been reviewed, patient examined, no change in status, stable for surgery.  I have reviewed the patient's chart and labs.  Questions were answered to the patient's satisfaction.     [place]

## 2022-03-30 NOTE — Anesthesia Postprocedure Evaluation (Signed)
Anesthesia Post Note  Patient: Darlene Barber  Procedure(s) Performed: COLONOSCOPY WITH PROPOFOL BIOPSY ESOPHAGOGASTRODUODENOSCOPY (EGD) WITH PROPOFOL     Patient location during evaluation: PACU Anesthesia Type: MAC Level of consciousness: awake and alert Pain management: pain level controlled Vital Signs Assessment: post-procedure vital signs reviewed and stable Respiratory status: spontaneous breathing, nonlabored ventilation, respiratory function stable and patient connected to nasal cannula oxygen Cardiovascular status: stable and blood pressure returned to baseline Postop Assessment: no apparent nausea or vomiting Anesthetic complications: no  No notable events documented.  Last Vitals:  Vitals:   03/30/22 1500 03/30/22 1600  BP: 112/67 117/72  Pulse: (!) 110 (!) 109  Resp: (!) 22 (!) 21  Temp:  (!) 38.3 C  SpO2: 99% 99%    Last Pain:  Vitals:   03/30/22 1600  TempSrc:   PainSc: 0-No pain                 Tiajuana Amass

## 2022-03-30 NOTE — Progress Notes (Signed)
Pharmacy Antibiotic Note  Darlene Barber is a 57 y.o. female admitted on 03/10/2022 with  intra-abd infection .  Pharmacy has been consulted for zosyn dosing.  Pt recently completed a course of abx for Streptococcus infantarius  bacteremia with ceftriaxone on 1/17. She is still having low grade fever. Zosyn has been ordered for empiric coverage for intra-abd infection. She is in AKI.  Scr 4.24  Plan: Zosyn 3.375g IV x1 then 2.25g IV q8  Height: 4\' 10"  (147.3 cm) Weight: 77.6 kg (171 lb 1.2 oz) IBW/kg (Calculated) : 40.9  Temp (24hrs), Avg:99.7 F (37.6 C), Min:97.2 F (36.2 C), Max:101.1 F (38.4 C)  Recent Labs  Lab 03/27/22 0311 03/28/22 0530 03/29/22 0255 03/29/22 1601 03/29/22 2207 03/30/22 0348 03/30/22 0349 03/30/22 1313  WBC 17.2* 18.2* 19.9* 16.4* 13.9*  --  10.4 14.5*  CREATININE 4.15* 4.17* 4.19*  --  4.00* 4.24*  --   --     Estimated Creatinine Clearance: 13 mL/min (A) (by C-G formula based on SCr of 4.24 mg/dL (H)).    Allergies  Allergen Reactions   [place] Other (See Comments)    Black pepper Feels like throat is closing, itchy Feels like throat is closing, itchy Feels like throat is closing, itchy   Tape Rash    Paper tape is ok to use.    Antimicrobials this admission: 1/20 zosyn>>  Dose adjustments this admission:   Microbiology results: 12/30 blood>>Streptococcus infantarius  1/3 blood>>negF 1/17 blood>>neg  Onnie Boer, PharmD, BCIDP, AAHIVP, CPP Infectious Disease Pharmacist 03/30/2022 6:08 PM

## 2022-03-30 NOTE — Progress Notes (Signed)
Patient ID: Darlene Barber, female   DOB: [birth year], 57 y.o.   MRN: [id number] S: Transferred to ICU after having ABLA and ongoing GIB.  Unfortunately CT angio of abd/pelvis was not helpful in locating the source of bleeding due to the presence of contrast in the large and small bowel. O:BP (!) 135/95   Pulse (!) 115   Temp 99.7 F (37.6 C)   Resp (!) 25   Ht 4\' 10"  (1.473 m)   Wt 77.6 kg   SpO2 100%   BMI 35.76 kg/m   Intake/Output Summary (Last 24 hours) at 03/30/2022 1039 Last data filed at 03/30/2022 1017 Gross per 24 hour  Intake 1668.7 ml  Output 1400 ml  Net 268.7 ml   Intake/Output: I/O last 3 completed shifts: In: 1983.3 [I.V.:571.9; Blood:1141.3; NG/GT:270] Out: 2750 [Urine:2250; Stool:500]  Intake/Output this shift:  Total I/O In: 611.8 [I.V.:211.8; Blood:400] Out: 1400 [Stool:1400] Weight change:  Gen: NAD, awake and alert in bed CVS: tachy at 115 Resp:CTA Abd: +BS, soft, NT/nD Ext: no edema  Recent Labs  Lab 03/24/22 0549 03/25/22 0411 03/26/22 0438 03/26/22 0734 03/27/22 0311 03/28/22 0530 03/29/22 0255 03/29/22 2207 03/30/22 0348  NA 142 142 140  --  140 136 136 134* 137  K 5.5* 5.6* 4.3  --  4.3 4.2 4.6 4.8 5.3*  CL 112* 114* 106  --  105 104 107 109 111  CO2 18* 16* 22  --  23 20* 17* 14* 14*  GLUCOSE 192* 162* 159*  --  123* 116* 157* 120* 113*  BUN 82* 98* 62*  --  73* 74* 72* 63* 63*  CREATININE 4.96* 5.29* 3.58*  --  4.15* 4.17* 4.19* 4.00* 4.24*  ALBUMIN 1.8* 1.8* 1.9* 2.0* 2.0* 1.9* 1.9*  --  <1.5*  CALCIUM 8.9 9.0 8.3*  --  9.0 8.7* 8.9 8.2* 8.5*  PHOS 7.9* 8.4* 6.3*  --  6.8* 5.9* 6.8*  --  8.0*  AST  --   --   --  24  --   --   --   --   --   ALT  --   --   --  27  --   --   --   --   --    Liver Function Tests: Recent Labs  Lab 03/26/22 0734 03/27/22 0311 03/28/22 0530 03/29/22 0255 03/30/22 0348  AST 24  --   --   --   --   ALT 27  --   --   --   --   ALKPHOS 94  --   --   --   --   BILITOT 0.4  --   --   --   --    PROT 5.6*  --   --   --   --   ALBUMIN 2.0*   < > 1.9* 1.9* <1.5*   < > = values in this interval not displayed.   No results for input(s): "LIPASE", "AMYLASE" in the last 168 hours. No results for input(s): "AMMONIA" in the last 168 hours. CBC: Recent Labs  Lab 03/28/22 0530 03/28/22 2240 03/29/22 0255 03/29/22 0630 03/29/22 1601 03/29/22 2207 03/30/22 0349  WBC 18.2*  --  19.9*  --  16.4* 13.9* 10.4  NEUTROABS  --   --   --   --   --  11.3*  --   HGB 6.0*   < > 7.0*   < > 8.2* 6.8* 6.4*  HCT 17.4*   < >  20.7*   < > 24.0* 19.7* 18.7*  MCV 94.1  --  88.5  --  87.0 87.9 88.2  PLT 373  --  305   < > 248 224 226   < > = values in this interval not displayed.   Cardiac Enzymes: No results for input(s): "CKTOTAL", "CKMB", "CKMBINDEX", "TROPONINI" in the last 168 hours. CBG: Recent Labs  Lab 03/29/22 1610 03/29/22 1927 03/29/22 2329 03/30/22 0320 03/30/22 0748  GLUCAP 142* 105* 117* 112* 119*    Iron Studies: No results for input(s): "IRON", "TIBC", "TRANSFERRIN", "FERRITIN" in the last 72 hours. Studies/Results: CT ANGIO GI BLEED  Result Date: 03/29/2022 CLINICAL DATA:  57 year old female with lower GI bleed. EXAM: CTA ABDOMEN AND PELVIS WITHOUT AND WITH CONTRAST TECHNIQUE: Multidetector CT imaging of the abdomen and pelvis was performed using the standard protocol during bolus administration of intravenous contrast. Multiplanar reconstructed images and MIPs were obtained and reviewed to evaluate the vascular anatomy. RADIATION DOSE REDUCTION: This exam was performed according to the departmental dose-optimization program which includes automated exposure control, adjustment of the mA and/or kV according to patient size and/or use of iterative reconstruction technique. CONTRAST:  45mL OMNIPAQUE IOHEXOL 350 MG/ML SOLN COMPARISON:  CT abdomen and pelvis with oral contrast on 03/29/2022 and CT from 03/15/2022 FINDINGS: VASCULAR Aorta: Minimal atherosclerotic disease in the  abdominal aorta without aneurysm, dissection or significant stenosis. Celiac: Patent without evidence of aneurysm, dissection, vasculitis or significant stenosis. SMA: Patent without evidence of aneurysm, dissection, vasculitis or significant stenosis. Renals: Both renal arteries are patent without evidence of aneurysm, dissection, vasculitis, fibromuscular dysplasia or significant stenosis. IMA: Patent without evidence of aneurysm, dissection, vasculitis or significant stenosis. Inflow: Patent without evidence of aneurysm, dissection, vasculitis or significant stenosis. Proximal Outflow: Proximal femoral arteries are patent bilaterally. Veins: Portal venous system is patent. IVC and renal veins are patent. Main iliac veins are patent. Proximal femoral veins are patent. Review of the MIP images confirms the above findings. NON-VASCULAR Lower chest: Residual patchy densities in the right middle lobe. No pleural effusions. Hepatobiliary: Cholecystectomy. Normal appearance of the liver without biliary dilatation. Pancreas: Unremarkable. No pancreatic ductal dilatation or surrounding inflammatory changes. Spleen: Normal in size without focal abnormality. Adrenals/Urinary Tract: Normal adrenal glands. At least 2 small left renal calculi, largest measures 2 mm. 3 mm calculus in the right kidney the upper/mid pole region. Probable cyst in the right kidney lower pole that does not require dedicated follow-up. Questionable 6 mm fat attenuating structure along the right kidney upper pole on image 44/5. This could be related to a cortical scar/cleft versus a small fat containing structure such as a tiny angiomyolipoma. This is not significantly changed since 2011 and does not require dedicated follow-up. No hydronephrosis. Urinary bladder is decompressed with a Foley catheter. No ureter dilatation. Stomach/Bowel: Nasogastric tube terminates in the stomach body. Limited evaluation for active GI bleeding because there is oral  contrast in the small and large bowel. Mild focal wall thickening in the colon near the splenic flexure on image 53/54. Mild pericolonic stranding involving the ascending colon. There is contrast within the normal appearing appendix. Contrast in the distal small bowel without bowel dilatation or obstruction. Normal appearance of the stomach. Lymphatic: Again noted are small periaortic lymph nodes in the abdomen. No significant lymph node enlargement in the abdomen or pelvis. Reproductive: Uterus appears to be surgically absent. No evidence for an adnexal mass. Other: Diffuse subcutaneous edema. Trace fluid in the pelvis and perihepatic region. Negative for  free air. Musculoskeletal: No acute bone abnormality. Scattered subcutaneous densities most likely related to prior injection sites. IMPRESSION: VASCULAR 1. Minimal atherosclerotic disease in the abdomen or pelvis. Aorta and main visceral arteries are patent without significant stenosis. NON-VASCULAR 1. Very limited evaluation for active GI bleeding because there was oral contrast within the distal small bowel and throughout the colon. Active GI bleeding cannot be assessed on this examination. 2. Residual mild colonic wall thickening most prominent near the splenic fracture. There may be mild colonic wall thickening in the right colon. Findings are compatible with residual or persistent colitis. 3. Nonobstructive renal calculi. 4. Diffuse subcutaneous edema. Electronically Signed   By: Markus Daft M.D.   On: 03/29/2022 14:19   CT ABDOMEN PELVIS WO CONTRAST  Result Date: 03/29/2022 CLINICAL DATA:  Acute, nonlocalized abdominal pain EXAM: CT ABDOMEN AND PELVIS WITHOUT CONTRAST TECHNIQUE: Multidetector CT imaging of the abdomen and pelvis was performed following the standard protocol without IV contrast. RADIATION DOSE REDUCTION: This exam was performed according to the departmental dose-optimization program which includes automated exposure control, adjustment of  the mA and/or kV according to patient size and/or use of iterative reconstruction technique. COMPARISON:  03/15/2022 FINDINGS: Lower chest: Patchy infiltrate in the lower lungs is likely improved. Hepatobiliary: No focal liver abnormality.Cholecystectomy without bile duct dilatation. Pancreas: Unremarkable. Spleen: Unremarkable. Adrenals/Urinary Tract: Negative adrenals. No hydronephrosis or ureteral stone. At least 2 renal calculi on both sides measuring up to 2 mm. The bladder is collapsed by Foley catheter. Stomach/Bowel: Diffuse colonic fluid levels reaching the rectum. Colonic wall thickening is improved. No bowel obstruction and no small bowel dilatation to imply ileus. An enteric tube tip is at the stomach. Vascular/Lymphatic: No acute vascular abnormality. Scattered atheromatous calcification of the aorta and iliacs. Reproductive:No acute finding Other: Decrease in pelvic fluid, essentially resolved. Musculoskeletal: No acute or aggressive finding. IMPRESSION: 1. Diarrheal illness with fluid reaching the rectum. Colitic wall thickening is improved from prior. 2. Improving bilateral pneumonia. 3. Bilateral nephrolithiasis without obstruction. Electronically Signed   By: Jorje Guild M.D.   On: 03/29/2022 07:11    sodium chloride   Intravenous Once   arformoterol  15 mcg Nebulization BID   budesonide (PULMICORT) nebulizer solution  0.5 mg Nebulization BID   Chlorhexidine Gluconate Cloth  6 each Topical Daily   darbepoetin (ARANESP) injection - DIALYSIS  40 mcg Subcutaneous Q Fri-1800   feeding supplement (NEPRO CARB STEADY)  1,000 mL Per Tube Q24H   fiber supplement (BANATROL TF)  60 mL Per Tube BID   insulin aspart  0-15 Units Subcutaneous Q4H   insulin aspart  3 Units Subcutaneous Q4H   multivitamin  1 tablet Oral QHS   mouth rinse  15 mL Mouth Rinse 4 times per day   [START ON 03/31/2022] pantoprazole  40 mg Intravenous Q12H   QUEtiapine  50 mg Oral BID   revefenacin  175 mcg Nebulization  Daily   sodium chloride flush  10-40 mL Intracatheter Q12H   Vilazodone HCl  40 mg Oral Daily    BMET    Component Value Date/Time   NA 137 03/30/2022 0348   NA 137 04/20/2012 1725   K 5.3 (H) 03/30/2022 0348   K 4.2 04/20/2012 1725   CL 111 03/30/2022 0348   CL 106 04/20/2012 1725   CO2 14 (L) 03/30/2022 0348   CO2 24 04/20/2012 1725   GLUCOSE 113 (H) 03/30/2022 0348   GLUCOSE 97 04/20/2012 1725   BUN 63 (H) 03/30/2022 0348  BUN 14 04/20/2012 1725   CREATININE 4.24 (H) 03/30/2022 0348   CREATININE 0.83 09/30/2019 1057   CALCIUM 8.5 (L) 03/30/2022 0348   CALCIUM 9.2 04/20/2012 1725   GFRNONAA 12 (L) 03/30/2022 0348   GFRNONAA 81 09/30/2019 1057   GFRAA 93 09/30/2019 1057   CBC    Component Value Date/Time   WBC 10.4 03/30/2022 0349   RBC 2.12 (L) 03/30/2022 0349   HGB 6.4 (LL) 03/30/2022 0349   HGB 16.7 (H) 04/20/2012 1725   HCT 18.7 (L) 03/30/2022 0349   HCT 50.0 (H) 04/20/2012 1725   PLT 226 03/30/2022 0349   PLT 331 04/20/2012 1725   MCV 88.2 03/30/2022 0349   MCV 98 04/20/2012 1725   MCH 30.2 03/30/2022 0349   MCHC 34.2 03/30/2022 0349   RDW 18.4 (H) 03/30/2022 0349   RDW 14.2 04/20/2012 1725   LYMPHSABS 1.9 03/29/2022 2207   LYMPHSABS 2.7 08/27/2011 0558   MONOABS 0.4 03/29/2022 2207   MONOABS 0.5 08/27/2011 0558   EOSABS 0.0 03/29/2022 2207   EOSABS 0.5 08/27/2011 0558   BASOSABS 0.0 03/29/2022 2207   BASOSABS 0.1 08/27/2011 0558    Assessment/ Plan: AKI - crt of 0.8 in August of 2023, bland UA in October of 23.  Now with AKI , proteinuria and hematuria but also pyuria.  Seems most likely to be AKI from ATN in the setting of bacteremia. Given the proteinuria and hematuria concerning for GN, some serologies were sent and were generally negative (see above). 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 SP CRRT 1/02- 1/11. Tx'd to [facility] 1/11. Her last HD session was 03/25/22 and right femoral HD catheter  removed 03/26/22.  UOP had been improving as well as BUN/Cr until she developed ABLA due to GI bleed and received IV contrast on 03/29/22.  UOP has dropped significantly due to GIB and CIN.  Receiving blood products.[name]  UOP dropped off after ABLA and IV contrast.   No indication to resume IHD at this time and hopefully will start to see increased UOP with blood products. Will follow response and decide if she will need a tunneled catheter and ongoing HD.   ABLA with evidence of acute GI bleed - pt with hematochezia last night and this morning.  Receiving blood transfusion.  Unfortunately CT angio could not isolate location of GIB due to presence of contrast in the small and large bowel.  For EGD today.  Continue to transfuse.   Hyperkalemia - lokelma x 2 and switching TF's to Nepro which has resolved issue. Volume - persistent infiltrates on CXR are likely infection-related.  HTN - BP's have improved.  Back on scheduled meds.  Normocytic Anemia  - deferring IV iron in setting of infection.  Aranesp 40 mcg every Thursday.  Hgb dropped to 6.4 again this morning, will need blood transfusion.  Gi following for ABLA.  Strep bacteremia-  streptococcus infantarius, TEE negative, getting Rocephin thru 1/17. Colonoscopy when more stable per pmd.  Acute hypoxic respiratory failure - extubated on 1/10, on high level [place] O2 now. Repeat CXR's show persistent bilat infiltrates as above H/o polysubstance abuse  Donetta Potts, MD [facility]

## 2022-03-30 NOTE — Transfer of Care (Signed)
Immediate Anesthesia Transfer of Care Note  Patient: Darlene Barber  Procedure(s) Performed: COLONOSCOPY WITH PROPOFOL BIOPSY  Patient Location: PACU  Anesthesia Type:MAC  Level of Consciousness: drowsy  Airway & Oxygen Therapy: Patient Spontanous Breathing and Patient connected to nasal cannula oxygen  Post-op Assessment: Report given to RN and Post -op Vital signs reviewed and stable  Post vital signs: Reviewed and stable  Last Vitals:  Vitals Value Taken Time  BP 145/77 03/30/22 1205  Temp 36.2 C 03/30/22 1205  Pulse 97 03/30/22 1209  Resp 21 03/30/22 1209  SpO2 97 % 03/30/22 1209  Vitals shown include unvalidated device data.  Last Pain:  Vitals:   03/30/22 1048  TempSrc: Temporal  PainSc: 0-No pain         Complications: No notable events documented.

## 2022-03-30 NOTE — Anesthesia Procedure Notes (Signed)
Procedure Name: MAC Date/Time: 03/30/2022 11:02 AM  Performed by: Lorie Phenix, CRNAPre-anesthesia Checklist: Patient identified, Emergency Drugs available, Suction available and Patient being monitored Patient Re-evaluated:Patient Re-evaluated prior to induction Oxygen Delivery Method: Nasal cannula Placement Confirmation: positive ETCO2

## 2022-03-30 NOTE — Op Note (Addendum)
[facility] Patient Name: Darlene Barber Procedure Date : 03/30/2022 MRN: [id number] Attending MD: [name] , MD, [id number] Date of Birth: 1965-06-12 CSN: [id number] Age: 57 Admit Type: Inpatient Procedure:                Colonoscopy Indications:              Gastrointestinal bleeding - hematochezia, negative                            NG lavage, suspect colonic source. CTA nondiagnostic Providers:                Remo Lipps P. Havery Moros, MD, Janee Morn,                            Technician, Jennefer Bravo RN Referring MD:              Medicines:                Monitored Anesthesia Care Complications:            No immediate complications. Estimated blood loss:                            Minimal. Estimated Blood Loss:     Estimated blood loss was minimal. Procedure:                Pre-Anesthesia Assessment:                           - Prior to the procedure, a History and Physical                            was performed, and patient medications and                            allergies were reviewed. The patient's tolerance of                            previous anesthesia was also reviewed. The risks                            and benefits of the procedure and the sedation                            options and risks were discussed with the patient.                            All questions were answered, and informed consent                            was obtained. Prior Anticoagulants: The patient has                            taken no anticoagulant or antiplatelet agents. ASA  Grade Assessment: IV - A patient with severe                            systemic disease that is a constant threat to life.                            After reviewing the risks and benefits, the patient                            was deemed in satisfactory condition to undergo the                            procedure.                           After  obtaining informed consent, the colonoscope                            was passed under direct vision. Throughout the                            procedure, the patient's blood pressure, pulse, and                            oxygen saturations were monitored continuously. The                            [id number]-HQ190L ([id number] Olympus coloscope was                            introduced through the anus and advanced to the the                            cecum, identified by the ileocecal valve. The                            colonoscopy was technically difficult and complex                            due to poor endoscopic visualization. The patient                            tolerated the procedure well. The quality of the                            bowel preparation was fair. The ileocecal valve and                            the rectum were photographed. Scope In: 11:12:44 AM Scope Out: 11:39:17 AM Scope Withdrawal Time: 0 hours 12 minutes 49 seconds  Total Procedure Duration: 0 hours 26 minutes 33 seconds  Findings:      The perianal and digital rectal examinations were normal.      Clotted and red blood was found in  the entire colon. Certain portions of       the colon could not be cleared of clot (cecum, part of ascending, other       dependant portions of the colon - splenic flexure, portions of sigmoid).       Ileum could not be intubated due to clot near IC valve      Multiple large clean based ulcers were found in the sigmoid colon, in       the descending colon, in the transverse colon, at the hepatic flexure       and in the ascending colon. Ulcers were deeper and larger in the right       colon. Could not see any with stigmata for bleeding, however could not       clear right colon / cecum due to clot burdern. Biopsies were taken with       a cold forceps for histology from the periphery of one small area to       assess for viral etiolofy, rule out ischemia, etc.      The exam was  otherwise without abnormality of what was visualized. Due       to small size of rectum, retroflexed views not obtained. Following       extensive lavage no active bleeding noted on the exam. Impression:               - Preparation of the colon was fair to poor in some                            areas due to clot burden.                           - Multiple ulcers throughout the colon, worst in                            ascending colon - could not clear entire colon due                            to clot or localize source of bleeding, suspect she                            has ulcer related bleeding, perhaps right colon,                            given burden of clot / blood.                           EGD was performed after this exam to ensure no                            upper tract source given location of blood noted                            throughout and was normal.                           Unclear if this represents ischemic change vs.  viral vs. other - small biopsies taken to assess                            for this. She is critically ill, no active bleeding                            following extensive lavage during this exam however                            as above visualization poor in several areas. Recommendation:           - Return patient to ICU for ongoing care.                           - NPO.                           - Continue present medications.                           - Await pathology results.                           - Trend Hgb, transfuse if needed                           - Patient is critically ill. While bleeding has                            slowed down and I could not appreciate focal active                            source on this exam, if she has significant                            rebleeding should it occur, hemostasis via                            colonoscopy would likely be low yield due to poor                             visualization. Consider repeat CTA although unclear                            if that would stop this, especially if ischemia on                            ddx. Colectomy is other option however due to                            comorbidities very high risk for surgery.                           - Will discuss with patient, family and critical  care team about plan / goals of care should                            bleeding persist Procedure Code(s):        --- Professional ---                           [phone], Colonoscopy, flexible; with biopsy, single                            or multiple Diagnosis Code(s):        --- Professional ---                           K92.2, Gastrointestinal hemorrhage, unspecified                           K63.3, Ulcer of intestine CPT copyright 2022 [employer]. All rights reserved. The codes documented in this report are preliminary and upon coder review may  be revised to meet current compliance requirements. Remo Lipps P. Cori Justus, MD 03/30/2022 12:09:00 PM This report has been signed electronically. Number of Addenda: 0

## 2022-03-31 ENCOUNTER — Inpatient Hospital Stay (HOSPITAL_COMMUNITY): Payer: Medicaid Other

## 2022-03-31 ENCOUNTER — Encounter (HOSPITAL_COMMUNITY): Payer: Self-pay | Admitting: Anesthesiology

## 2022-03-31 ENCOUNTER — Encounter (HOSPITAL_COMMUNITY)
Disposition: A | Discharge status: Skilled Nursing Facility | Payer: Self-pay | Source: Other Acute Inpatient Hospital | Attending: Internal Medicine

## 2022-03-31 DIAGNOSIS — J9601 Acute respiratory failure with hypoxia: Secondary | ICD-10-CM | POA: Diagnosis not present

## 2022-03-31 DIAGNOSIS — N179 Acute kidney failure, unspecified: Secondary | ICD-10-CM | POA: Diagnosis not present

## 2022-03-31 DIAGNOSIS — A419 Sepsis, unspecified organism: Secondary | ICD-10-CM | POA: Diagnosis not present

## 2022-03-31 DIAGNOSIS — K633 Ulcer of intestine: Secondary | ICD-10-CM

## 2022-03-31 DIAGNOSIS — G9340 Encephalopathy, unspecified: Secondary | ICD-10-CM | POA: Diagnosis not present

## 2022-03-31 LAB — CBC WITH DIFFERENTIAL/PLATELET
Abs Immature Granulocytes: 0.4 10*3/uL — ABNORMAL HIGH (ref 0.00–0.07)
Basophils Absolute: 0 10*3/uL (ref 0.0–0.1)
Basophils Relative: 0 %
Eosinophils Absolute: 0.4 10*3/uL (ref 0.0–0.5)
Eosinophils Relative: 3 %
HCT: 29.9 % — ABNORMAL LOW (ref 36.0–46.0)
Hemoglobin: 10.4 g/dL — ABNORMAL LOW (ref 12.0–15.0)
Lymphocytes Relative: 9 %
Lymphs Abs: 1.3 10*3/uL (ref 0.7–4.0)
MCH: 30.7 pg (ref 26.0–34.0)
MCHC: 34.8 g/dL (ref 30.0–36.0)
MCV: 88.2 fL (ref 80.0–100.0)
Monocytes Absolute: 0.3 10*3/uL (ref 0.1–1.0)
Monocytes Relative: 2 %
Neutro Abs: 12.4 10*3/uL — ABNORMAL HIGH (ref 1.7–7.7)
Neutrophils Relative %: 83 %
Platelets: 249 10*3/uL (ref 150–400)
Promyelocytes Relative: 3 %
RBC: 3.39 MIL/uL — ABNORMAL LOW (ref 3.87–5.11)
RDW: 17.2 % — ABNORMAL HIGH (ref 11.5–15.5)
WBC: 14.9 10*3/uL — ABNORMAL HIGH (ref 4.0–10.5)
nRBC: 2 /100 WBC — ABNORMAL HIGH
nRBC: 2.5 % — ABNORMAL HIGH (ref 0.0–0.2)

## 2022-03-31 LAB — PROTIME-INR
INR: 1.4 — ABNORMAL HIGH (ref 0.8–1.2)
Prothrombin Time: 16.6 seconds — ABNORMAL HIGH (ref 11.4–15.2)

## 2022-03-31 LAB — CBC
HCT: 36.1 % (ref 36.0–46.0)
Hemoglobin: 12.4 g/dL (ref 12.0–15.0)
MCH: 30 pg (ref 26.0–34.0)
MCHC: 34.3 g/dL (ref 30.0–36.0)
MCV: 87.2 fL (ref 80.0–100.0)
Platelets: 305 10*3/uL (ref 150–400)
RBC: 4.14 MIL/uL (ref 3.87–5.11)
RDW: 17.3 % — ABNORMAL HIGH (ref 11.5–15.5)
WBC: 14.5 10*3/uL — ABNORMAL HIGH (ref 4.0–10.5)
nRBC: 2.8 % — ABNORMAL HIGH (ref 0.0–0.2)

## 2022-03-31 LAB — HEMOGLOBIN AND HEMATOCRIT, BLOOD
HCT: 22.5 % — ABNORMAL LOW (ref 36.0–46.0)
Hemoglobin: 7.5 g/dL — ABNORMAL LOW (ref 12.0–15.0)

## 2022-03-31 LAB — MAGNESIUM: Magnesium: 1.4 mg/dL — ABNORMAL LOW (ref 1.7–2.4)

## 2022-03-31 LAB — BASIC METABOLIC PANEL
Anion gap: 15 (ref 5–15)
BUN: 50 mg/dL — ABNORMAL HIGH (ref 6–20)
CO2: 17 mmol/L — ABNORMAL LOW (ref 22–32)
Calcium: 8.5 mg/dL — ABNORMAL LOW (ref 8.9–10.3)
Chloride: 109 mmol/L (ref 98–111)
Creatinine, Ser: 4.2 mg/dL — ABNORMAL HIGH (ref 0.44–1.00)
GFR, Estimated: 12 mL/min — ABNORMAL LOW (ref >60)
Glucose, Bld: 105 mg/dL — ABNORMAL HIGH (ref 70–99)
Potassium: 4.4 mmol/L (ref 3.5–5.1)
Sodium: 141 mmol/L (ref 135–145)

## 2022-03-31 LAB — RENAL FUNCTION PANEL
Albumin: 1.8 g/dL — ABNORMAL LOW (ref 3.5–5.0)
Anion gap: 14 (ref 5–15)
BUN: 53 mg/dL — ABNORMAL HIGH (ref 6–20)
CO2: 11 mmol/L — ABNORMAL LOW (ref 22–32)
Calcium: 8.3 mg/dL — ABNORMAL LOW (ref 8.9–10.3)
Chloride: 109 mmol/L (ref 98–111)
Creatinine, Ser: 3.93 mg/dL — ABNORMAL HIGH (ref 0.44–1.00)
GFR, Estimated: 13 mL/min — ABNORMAL LOW (ref >60)
Glucose, Bld: 118 mg/dL — ABNORMAL HIGH (ref 70–99)
Phosphorus: 7.1 mg/dL — ABNORMAL HIGH (ref 2.5–4.6)
Potassium: 4.7 mmol/L (ref 3.5–5.1)
Sodium: 134 mmol/L — ABNORMAL LOW (ref 135–145)

## 2022-03-31 LAB — FIBRINOGEN: Fibrinogen: 435 mg/dL (ref 210–475)

## 2022-03-31 LAB — GLUCOSE, CAPILLARY
Glucose-Capillary: 100 mg/dL — ABNORMAL HIGH (ref 70–99)
Glucose-Capillary: 125 mg/dL — ABNORMAL HIGH (ref 70–99)
Glucose-Capillary: 114 mg/dL — ABNORMAL HIGH (ref 70–99)
Glucose-Capillary: 63 mg/dL — ABNORMAL LOW (ref 70–99)
Glucose-Capillary: 80 mg/dL (ref 70–99)
Glucose-Capillary: 91 mg/dL (ref 70–99)
Glucose-Capillary: 89 mg/dL (ref 70–99)

## 2022-03-31 LAB — PREPARE RBC (CROSSMATCH)

## 2022-03-31 SURGERY — LAPAROTOMY, EXPLORATORY
Anesthesia: General

## 2022-03-31 MED ORDER — FENTANYL CITRATE (PF) 250 MCG/5ML IJ SOLN
INTRAMUSCULAR | Status: AC
  Filled 2022-03-31: qty 5

## 2022-03-31 MED ORDER — SODIUM CHLORIDE 0.9% IV SOLUTION: Freq: Once | INTRAVENOUS | Status: AC

## 2022-03-31 MED ORDER — SODIUM CHLORIDE 0.9 % IV SOLN: INTRAVENOUS | Status: AC

## 2022-03-31 MED ORDER — SODIUM BICARBONATE 8.4 % IV SOLN
100.0000 meq | Freq: Once | INTRAVENOUS | Status: AC
  Administered 2022-03-31: 100 meq via INTRAVENOUS
  Filled 2022-03-31: qty 100

## 2022-03-31 MED ORDER — PANTOPRAZOLE SODIUM 40 MG IV SOLR
40.0000 mg | Freq: Two times a day (BID) | INTRAVENOUS | Status: DC
  Administered 2022-03-31 – 2022-04-02 (×6): 40 mg via INTRAVENOUS
  Filled 2022-03-31 (×6): qty 10

## 2022-03-31 MED ORDER — FUROSEMIDE 10 MG/ML IJ SOLN
100.0000 mg | Freq: Once | INTRAVENOUS | Status: AC
  Administered 2022-03-31: 100 mg via INTRAVENOUS
  Filled 2022-03-31 (×2): qty 10

## 2022-03-31 MED ORDER — HYDROMORPHONE HCL 1 MG/ML IJ SOLN
INTRAMUSCULAR | Status: AC
  Filled 2022-03-31: qty 1

## 2022-03-31 MED ORDER — DEXTROSE 50 % IV SOLN
INTRAVENOUS | Status: AC
  Administered 2022-03-31: 25 mL
  Filled 2022-03-31: qty 50

## 2022-03-31 MED ORDER — IOHEXOL 350 MG/ML SOLN
100.0000 mL | Freq: Once | INTRAVENOUS | Status: AC | PRN
  Administered 2022-03-31: 100 mL via INTRAVENOUS

## 2022-03-31 NOTE — Progress Notes (Signed)
Called to evaluate patient for multiple episodes of GI bleeding.  Febrile overnight as well.   57 yo woman with likely infectious vs inflammatory gastroenteritis, Gi bleeding and blood loss anemia. AKI, on CKD2, Polysubstance abuse, chronic pain, cervical spondylitis, GERD< bipolar disorder, hx of hyponatremia, hyperphos, hypocalcemia.  Colonoscopy today: blood and ulceration on R colon.    Initially presented on 12/31 with abdominal pain and rash.   Gastroenteritis.  N/V/D. Shock liver, hypotension/sepsis, multifocal pna.   Had had issues with hypertension during admission.   Was intubated initially and on CRRT.  Extubated 1/11.   Intermittent HD. - last HD needed 1/15.  1/19: PCCM called back for GIB.    1/20 had colonoscopy and Upper endoscopy.   Diffuse colonic ulcers.   Surgery consulted.    CTA from 3am:  IMPRESSION: 1. No active gastrointestinal hemorrhage identified. 2. Multifocal infectious or inflammatory colitis involving the splenic flexure of the colon and ascending colon. No evidence of obstruction or perforation. 3. Fluid-filled colon and rectal vault in keeping with a diarrheal state. 4. Patchy bibasilar ground-glass pulmonary infiltrate, likely infectious or inflammatory but incompletely evaluated on this examination. 5. Mild bilateral nonobstructing nephrolithiasis. 6. Aortic atherosclerosis.   Giving additional 4 u prbc now, 2  u ffp.   Checking inr/pt, cbc, fibrinogen.   Surgery was called by Dr. Prudencio Burly.

## 2022-03-31 NOTE — Progress Notes (Signed)
NAME:  Darlene Barber, MRN:  [id number], DOB:  Jan 07, 1966, LOS: 21 ADMISSION DATE:  03/10/2022, CONSULTATION DATE:  12/30  REFERRING MD:  Dr. Cinda Quest, CHIEF COMPLAINT:  Abdominal Pain    History of Present Illness:  [name] who presented to the [facility] ER on 12/31 with reports of abdominal pain and widespread rash.  On admission, the patient significant other reported that the day after eating at a local restaurant in [place], [place] she developed abdominal pain and rash.  She was reportedly up all night with crampy abdominal pain associated with nausea and vomiting.  Around 2 PM on the day of admission she noticed a rash all over her body and had worsening pain prompting him to call EMS.  She was evaluated in the emergency room and was found to have oxygen saturations of 87% on 2 L, afebrile with an elevated white blood cell count of 29.2.  Additional labs notable for acute kidney injury, anion gap metabolic acidosis and LFTs with mixed hepatic and cholestatic picture.  Stool samples were sent with workup of acute diarrhea.  Stool pathogen panel was negative.  C. difficile toxin was negative.  She was treated with 30 mL/kg IV fluid and started on empiric broad-spectrum antibiotics with vancomycin, cefepime and Flagyl.  She remained hypotensive despite IV fluid boluses and was started on vasopressors.  She was transferred to [facility] as there were no inpatient beds at [facility].    PCCM consulted for ICU admission.  Pertinent  Medical History  Chronic Pain Syndrome  Cervical Spondylitis  GERD  Bipolar Disorder  Tobacco Abuse - daily  THC Abuse - daily   Significant Hospital Events: Including procedures, antibiotic start and stop dates in addition to other pertinent events   12/31 admit with abdominal pain, diarrhea.  Left IJ TLC placement.  UDS positive for tricyclics, THC and amphetamines.  C. difficile negative, GI PCR negative.  COVID, flu, RSV negative.  Antibiotics narrowed to Zosyn.  BC positives for strep infantarius.  CT chest abdomen pelvis with fluid throughout the stomach, small bowel and colon, nonspecific but may represent diarrheal state.  No bowel obstruction, definite bowel wall thickening or pneumoperitoneum.  Fluid within the esophagus may represent reflux or nonspecific esophageal motility disorder.  Trace amount of free pelvic fluid, nonspecific.   1/3 CRRT, PEEP 5/60% 1/4 CRRT with negative balance. Precedex added to Fentanyl.  1/5 CT chest abd pelvis, TEE done with no evidence of vegetations 1/8 Taken off CRRT, MIVF started 1/9 too wild on precedex, severe HTN prevented extubation 1/10 restarted on CRRT 1/11 extubated, transferred from [facility] for iHD 1/13 Clonazepam 0.5 twice daily, Seroquel to twice daily 1/14 Precedex weaned to off this morning 03/29/2022 critical care called back for new GI bleed transferred back to intensive care unit GI has been consulted 1/20 continue GI bleed overnight, hemoglobin dropped to 6.41 unit PRBCs ordered 1/21 recurrent GI bleed overnight with additional PRBCs, platelets, and FFP ordered.  Decision made to engage general surgery again and patient is pended for colectomy this a.m.  Interim History / Subjective:  Remains encephalopathic Family at bedside and updated  Objective   Blood pressure (!) 140/99, pulse (!) 106, temperature 99.9 F (37.7 C), resp. rate (!) 21, height 4\' 10"  (1.473 m), weight 74.8 kg, SpO2 96 %.        Intake/Output Summary (Last 24 hours) at 03/31/2022 0902 Last data filed at 03/31/2022 0752 Gross per 24 hour  Intake 4349.55 ml  Output 5200 ml  Net -850.45 ml    Filed Weights   03/27/22 0500 03/28/22 0500 03/31/22 0452  Weight: 77.4 kg 77.6 kg 74.8 kg    Examination: General: Acute on chronic ill-appearing elderly female lying in bed on in no acute distress HEENT: [place]/AT, MM pink/moist, PERRL,  Neuro: Alert to self, confused to situation and place CV: s1s2 regular rate and rhythm, no  murmur, rubs, or gallops,  PULM: Clear to auscultation bilaterally, no increased work of breathing, no added breath sounds GI: soft, bowel sounds active in all 4 quadrants, non-tender, non-distended Extremities: warm/dry, no edema  Skin: no rashes or lesions  Resolved Hospital Problem list   Hypotension - sepsis + sedation  Transaminitis Shock Liver Rash, Hives TCP related to CRRT, sepsis Multifocal pneumonia, possible VAP, completed course 1/11 Possible melena  Assessment & Plan:  GI bleeding in the setting of multiple colonic ulcers ischemic related -Colonoscopy performed 1/20 with multiple colonic ulcers seen unable to identify active bleed with poor visualization.  GI recommended either repeating CTA to help with an upper bleed versus colectomy.  CTA was repeated overnight 1/21 with no signs of bleeding seen despite gross active GI bleed.  General surgery updated and decision made to proceed with colectomy this a.m. Acute blood loss anemia superimposed on normocytic anemia  P: General surgery and GI following, appreciate assistance Tentative plan for colectomy this a.m. Trend CBC Transfuse per protocol Hemoglobin goal greater than 7 N.p.o. Maintain 2 large-bore IVs Continue IV Protonix  Acute Metabolic / Toxic Encephalopathy  -Remains confused, awakens to voice and tracking but not following commands or speaking. Likely related to sedation in setting of poor renal function. P: Remains on IV Precedex Continues Scheduled Seroquel As needed IV Haldol Maintain neuroprotective measures Nutrition and bowel regimen Aspiration precautions  Renal Failure, likely ATN   -SP CRRT 1/02- 1/11. Tx'd to [facility] 1/11  -Received another contrast load overnight of 1/21 for repeat CTA, this is likely to result in recurrent and/or permanent renal failure.  P: Nephrology following, appreciate assistance Follow renal function Monitor urine output Avoid nephrotoxins as able Ensure adequate  renal perfusion  UDS Positive: Amphetamines, Cannabis, TCA's  P: Cessation consulting when appropriate   Bipolar Disorder Chronic Pain  P: Continue Vilazodone and Seroquel   Hyperglycemia P: SSI and TF coverage  CBG goal 140-180  HTN -Home medications include Norvasc, aspirin, P: Hold home medications in setting of GI bleed  Continuous telemetry   At Risk Malnutrition  Hypoalonemia  P: Resume TF once cleared by GI and surgery   Hyperkalemia  P: Trend Biochemist, clinical (right click and "Reselect all SmartList Selections" daily)  Diet/type: tubefeeds  DVT prophylaxis: prophylactic heparin  GI prophylaxis: PPI Lines: Central line and Dialysis Catheter (PICC 1/10-, R fem HD cath 1/2-)   Foley:  Yes, and it is still needed Code Status:  full code Last date of multidisciplinary goals of care discussion: Discussed with son POA Lourdes Kucharski 01/28/23  CRITICAL CARE Performed by: Jago Carton D. Harris  Total critical care time: 40 minutes  Critical care time was exclusive of separately billable procedures and treating other patients.  Critical care was necessary to treat or prevent imminent or life-threatening deterioration.  Critical care was time spent personally by me on the following activities: development of treatment plan with patient and/or surrogate as well as nursing, discussions with consultants, evaluation of patient's response to treatment, examination of patient, obtaining history from patient or surrogate, ordering  and performing treatments and interventions, ordering and review of laboratory studies, ordering and review of radiographic studies, pulse oximetry and re-evaluation of patient's condition.  Tremaine Earwood D. Harris, NP-C [facility] Personal contact information can be found on Amion  If no contact or response made please call [phone] 03/31/2022, 9:02 AM

## 2022-03-31 NOTE — Anesthesia Preprocedure Evaluation (Deleted)
Anesthesia Evaluation    Reviewed: Allergy & Precautions, [name] Chart, lab work & pertinent test results  History of Anesthesia Complications Negative for: history of anesthetic complications  Airway        Dental   Pulmonary sleep apnea , COPD, Current Smoker          Cardiovascular hypertension, Pt. on medications      Neuro/Psych  Headaches PSYCHIATRIC DISORDERS Anxiety Depression Bipolar Disorder    Neuromuscular disease    GI/Hepatic ,GERD  ,,(+)     substance abuse  marijuana use  Endo/Other  diabetes, Type 2   Obesity   Renal/GU Renal disease  Female GU complaint     Musculoskeletal  (+) Arthritis ,    Abdominal   Peds  Hematology  (+) Blood dyscrasia, anemia   Anesthesia Other Findings Chronic pain   Reproductive/Obstetrics                              Anesthesia Physical Anesthesia Plan  ASA: 3  Anesthesia Plan: General   Post-op Pain Management: Ofirmev IV (intra-op)*   Induction: Intravenous and Rapid sequence  PONV Risk Score and Plan: 2 and Treatment may vary due to age or medical condition, Ondansetron, Dexamethasone and Midazolam  Airway Management Planned: Oral ETT  Additional Equipment: None  Intra-op Plan:   Post-operative Plan: Possible Post-op intubation/ventilation  Informed Consent:   Plan Discussed with: CRNA and Anesthesiologist  Anesthesia Plan Comments:          Anesthesia Quick Evaluation

## 2022-03-31 NOTE — Progress Notes (Signed)
Patient ID: Darlene Barber, female   DOB: 1965-11-04, 57 y.o.   MRN: [id number] S: Awake and alert this morning but is still having some confusion and ongoing GIB.  Went for another CT angio GI Bleed study which could not identify a specific source and likely due to diffuse colonic ulcers and colitis. O:BP (!) 147/97   Pulse (!) 108   Temp 99.5 F (37.5 C)   Resp (!) 28   Ht 4\' 10"  (1.473 m)   Wt 74.8 kg   SpO2 98%   BMI 34.47 kg/m   Intake/Output Summary (Last 24 hours) at 03/31/2022 1013 Last data filed at 03/31/2022 0900 Gross per 24 hour  Intake 4636.22 ml  Output 5200 ml  Net -563.78 ml   Intake/Output: I/O last 3 completed shifts: In: 3676.5 [P.O.:240; I.V.:1701.1; Blood:1621; NG/GT:30; IV Piggyback:84.5] Out: 5200 [Urine:3800; YSAYT:0160]  Intake/Output this shift:  Total I/O In: 2073.4 [I.V.:113.1; Blood:1944.8; IV Piggyback:15.6] Out: -  Weight change:  Gen: NAD CVS: tachy at 108 Resp: CTA Abd: +BS, soft, NT/ND Ext: no edema  Recent Labs  Lab 03/25/22 0411 03/26/22 0438 03/26/22 0734 03/27/22 0311 03/28/22 0530 03/29/22 0255 03/29/22 2207 03/30/22 0348 03/31/22 0620  NA 142 140  --  140 136 136 134* 137 134*  K 5.6* 4.3  --  4.3 4.2 4.6 4.8 5.3* 4.7  CL 114* 106  --  105 104 107 109 111 109  CO2 16* 22  --  23 20* 17* 14* 14* 11*  GLUCOSE 162* 159*  --  123* 116* 157* 120* 113* 118*  BUN 98* 62*  --  73* 74* 72* 63* 63* 53*  CREATININE 5.29* 3.58*  --  4.15* 4.17* 4.19* 4.00* 4.24* 3.93*  ALBUMIN 1.8* 1.9* 2.0* 2.0* 1.9* 1.9*  --  <1.5* 1.8*  CALCIUM 9.0 8.3*  --  9.0 8.7* 8.9 8.2* 8.5* 8.3*  PHOS 8.4* 6.3*  --  6.8* 5.9* 6.8*  --  8.0* 7.1*  AST  --   --  24  --   --   --   --   --   --   ALT  --   --  27  --   --   --   --   --   --    Liver Function Tests: Recent Labs  Lab 03/26/22 0734 03/27/22 0311 03/29/22 0255 03/30/22 0348 03/31/22 0620  AST 24  --   --   --   --   ALT 27  --   --   --   --   ALKPHOS 94  --   --   --   --   BILITOT  0.4  --   --   --   --   PROT 5.6*  --   --   --   --   ALBUMIN 2.0*   < > 1.9* <1.5* 1.8*   < > = values in this interval not displayed.   No results for input(s): "LIPASE", "AMYLASE" in the last 168 hours. No results for input(s): "AMMONIA" in the last 168 hours. CBC: Recent Labs  Lab 03/29/22 2207 03/30/22 0349 03/30/22 1313 03/30/22 2215 03/31/22 0102 03/31/22 0620  WBC 13.9* 10.4 14.5* 10.6*  --  14.9*  NEUTROABS 11.3*  --   --   --   --  12.4*  HGB 6.8* 6.4* 7.4* 6.7* 7.5* 10.4*  HCT 19.7* 18.7* 21.6* 19.8* 22.5* 29.9*  MCV 87.9 88.2 87.8 89.6  --  88.2  PLT 224 226 238 244  --  249   Cardiac Enzymes: No results for input(s): "CKTOTAL", "CKMB", "CKMBINDEX", "TROPONINI" in the last 168 hours. CBG: Recent Labs  Lab 03/30/22 1931 03/30/22 1958 03/30/22 2310 03/31/22 0315 03/31/22 0805  GLUCAP 57* 117* 97 100* 125*    Iron Studies: No results for input(s): "IRON", "TIBC", "TRANSFERRIN", "FERRITIN" in the last 72 hours. Studies/Results: CT ANGIO GI BLEED  Result Date: 03/31/2022 CLINICAL DATA:  Lower gastrointestinal hemorrhage EXAM: CTA ABDOMEN AND PELVIS WITHOUT AND WITH CONTRAST TECHNIQUE: Multidetector CT imaging of the abdomen and pelvis was performed using the standard protocol during bolus administration of intravenous contrast. Multiplanar reconstructed images and MIPs were obtained and reviewed to evaluate the vascular anatomy. RADIATION DOSE REDUCTION: This exam was performed according to the departmental dose-optimization program which includes automated exposure control, adjustment of the mA and/or kV according to patient size and/or use of iterative reconstruction technique. CONTRAST:  161mL OMNIPAQUE IOHEXOL 350 MG/ML SOLN COMPARISON:  03/29/2022 FINDINGS: VASCULAR Aorta: Normal caliber aorta without aneurysm, dissection, vasculitis or significant stenosis. Mild atherosclerotic calcification Celiac: Variant anatomy with separate origin of the left gastric artery  directly from the aorta. No evidence of hemodynamically significant stenosis. No aneurysm or dissection. SMA: Patent without evidence of aneurysm, dissection, vasculitis or significant stenosis. Renals: Both renal arteries are patent without evidence of aneurysm, dissection, vasculitis, fibromuscular dysplasia or significant stenosis. IMA: Patent without evidence of aneurysm, dissection, vasculitis or significant stenosis. Inflow: Patent without evidence of aneurysm, dissection, vasculitis or significant stenosis. Proximal Outflow: Bilateral common femoral and visualized portions of the superficial and profunda femoral arteries are patent without evidence of aneurysm, dissection, vasculitis or significant stenosis. Veins: No obvious venous abnormality within the limitations of this arterial phase study. Review of the MIP images confirms the above findings. NON-VASCULAR Lower chest: Patchy bibasilar ground-glass pulmonary infiltrate, most prevalent within the right middle lobe, is again identified, likely infectious or inflammatory but incompletely evaluated on this examination. No pleural effusion. Cardiac size is mildly enlarged. Hepatobiliary: No focal liver abnormality is seen. Status post cholecystectomy. No biliary dilatation. Pancreas: Unremarkable Spleen: Unremarkable Adrenals/Urinary Tract: The adrenal glands are unremarkable. The kidneys are normal in size and position. Bilateral punctate nonobstructing renal calculi are identified measuring up to 3 mm. No hydronephrosis. No ureteral calculi. No perinephric inflammatory stranding or fluid collections are seen. The bladder is decompressed with a Foley catheter balloon seen within its lumen. Stomach/Bowel: The colon is diffusely fluid-filled, as is the rectal vault. There is superimposed pericolonic inflammatory stranding and hyperemia involving the splenic flexure of the colon and ascending colon in keeping with changes of multifocal infectious or  inflammatory colitis. No evidence of obstruction or perforation. No free intraperitoneal gas. Trace free fluid within the pelvis, unchanged, nonspecific. No active extravasation of contrast identified. The stomach and small bowel are unremarkable. Appendix normal. Lymphatic: No pathologic adenopathy within the abdomen and pelvis. Reproductive: Status post hysterectomy. No adnexal masses. Other: No abdominal wall hernia Musculoskeletal: Osseous structures are age-appropriate. No acute bone abnormality. No lytic or blastic bone lesion. IMPRESSION: 1. No active gastrointestinal hemorrhage identified. 2. Multifocal infectious or inflammatory colitis involving the splenic flexure of the colon and ascending colon. No evidence of obstruction or perforation. 3. Fluid-filled colon and rectal vault in keeping with a diarrheal state. 4. Patchy bibasilar ground-glass pulmonary infiltrate, likely infectious or inflammatory but incompletely evaluated on this examination. 5. Mild bilateral nonobstructing nephrolithiasis. 6. Aortic atherosclerosis. Aortic Atherosclerosis (ICD10-I70.0). Electronically Signed   By: Cassandria Anger  Christa See M.D.   On: 03/31/2022 03:17   DG Abd Portable 1V  Result Date: 03/30/2022 CLINICAL DATA:  OG tube placement EXAM: PORTABLE ABDOMEN - 1 VIEW COMPARISON:  CTA GI bleed 03/29/2022 FINDINGS: PICC line tip projects over the superior vena cava. Enteric tube tip projects over the left upper quadrant. Cholecystectomy clips. Gaseous distended loops of bowel throughout the visualized upper abdomen. IMPRESSION: Enteric tube tip projects over the left upper quadrant. Electronically Signed   By: Lovey Newcomer M.D.   On: 03/30/2022 17:11   CT ANGIO GI BLEED  Result Date: 03/29/2022 CLINICAL DATA:  57 year old female with lower GI bleed. EXAM: CTA ABDOMEN AND PELVIS WITHOUT AND WITH CONTRAST TECHNIQUE: Multidetector CT imaging of the abdomen and pelvis was performed using the standard protocol during bolus  administration of intravenous contrast. Multiplanar reconstructed images and MIPs were obtained and reviewed to evaluate the vascular anatomy. RADIATION DOSE REDUCTION: This exam was performed according to the departmental dose-optimization program which includes automated exposure control, adjustment of the mA and/or kV according to patient size and/or use of iterative reconstruction technique. CONTRAST:  22mL OMNIPAQUE IOHEXOL 350 MG/ML SOLN COMPARISON:  CT abdomen and pelvis with oral contrast on 03/29/2022 and CT from 03/15/2022 FINDINGS: VASCULAR Aorta: Minimal atherosclerotic disease in the abdominal aorta without aneurysm, dissection or significant stenosis. Celiac: Patent without evidence of aneurysm, dissection, vasculitis or significant stenosis. SMA: Patent without evidence of aneurysm, dissection, vasculitis or significant stenosis. Renals: Both renal arteries are patent without evidence of aneurysm, dissection, vasculitis, fibromuscular dysplasia or significant stenosis. IMA: Patent without evidence of aneurysm, dissection, vasculitis or significant stenosis. Inflow: Patent without evidence of aneurysm, dissection, vasculitis or significant stenosis. Proximal Outflow: Proximal femoral arteries are patent bilaterally. Veins: Portal venous system is patent. IVC and renal veins are patent. Main iliac veins are patent. Proximal femoral veins are patent. Review of the MIP images confirms the above findings. NON-VASCULAR Lower chest: Residual patchy densities in the right middle lobe. No pleural effusions. Hepatobiliary: Cholecystectomy. Normal appearance of the liver without biliary dilatation. Pancreas: Unremarkable. No pancreatic ductal dilatation or surrounding inflammatory changes. Spleen: Normal in size without focal abnormality. Adrenals/Urinary Tract: Normal adrenal glands. At least 2 small left renal calculi, largest measures 2 mm. 3 mm calculus in the right kidney the upper/mid pole region. Probable  cyst in the right kidney lower pole that does not require dedicated follow-up. Questionable 6 mm fat attenuating structure along the right kidney upper pole on image 44/5. This could be related to a cortical scar/cleft versus a small fat containing structure such as a tiny angiomyolipoma. This is not significantly changed since 2011 and does not require dedicated follow-up. No hydronephrosis. Urinary bladder is decompressed with a Foley catheter. No ureter dilatation. Stomach/Bowel: Nasogastric tube terminates in the stomach body. Limited evaluation for active GI bleeding because there is oral contrast in the small and large bowel. Mild focal wall thickening in the colon near the splenic flexure on image 53/54. Mild pericolonic stranding involving the ascending colon. There is contrast within the normal appearing appendix. Contrast in the distal small bowel without bowel dilatation or obstruction. Normal appearance of the stomach. Lymphatic: Again noted are small periaortic lymph nodes in the abdomen. No significant lymph node enlargement in the abdomen or pelvis. Reproductive: Uterus appears to be surgically absent. No evidence for an adnexal mass. Other: Diffuse subcutaneous edema. Trace fluid in the pelvis and perihepatic region. Negative for free air. Musculoskeletal: No acute bone abnormality. Scattered subcutaneous densities most likely  related to prior injection sites. IMPRESSION: VASCULAR 1. Minimal atherosclerotic disease in the abdomen or pelvis. Aorta and main visceral arteries are patent without significant stenosis. NON-VASCULAR 1. Very limited evaluation for active GI bleeding because there was oral contrast within the distal small bowel and throughout the colon. Active GI bleeding cannot be assessed on this examination. 2. Residual mild colonic wall thickening most prominent near the splenic fracture. There may be mild colonic wall thickening in the right colon. Findings are compatible with residual  or persistent colitis. 3. Nonobstructive renal calculi. 4. Diffuse subcutaneous edema. Electronically Signed   By: Markus Daft M.D.   On: 03/29/2022 14:19    sodium chloride   Intravenous Once   arformoterol  15 mcg Nebulization BID   budesonide (PULMICORT) nebulizer solution  0.5 mg Nebulization BID   Chlorhexidine Gluconate Cloth  6 each Topical Daily   darbepoetin (ARANESP) injection - DIALYSIS  40 mcg Subcutaneous Q Fri-1800   feeding supplement (NEPRO CARB STEADY)  1,000 mL Per Tube Q24H   fiber supplement (BANATROL TF)  60 mL Per Tube BID   insulin aspart  0-15 Units Subcutaneous Q4H   insulin aspart  3 Units Subcutaneous Q4H   multivitamin  1 tablet Oral QHS   mouth rinse  15 mL Mouth Rinse 4 times per day   pantoprazole (PROTONIX) IV  40 mg Intravenous Q12H   QUEtiapine  50 mg Oral BID   revefenacin  175 mcg Nebulization Daily   sodium chloride flush  10-40 mL Intracatheter Q12H   Vilazodone HCl  40 mg Oral Daily    BMET    Component Value Date/Time   NA 134 (L) 03/31/2022 0620   NA 137 04/20/2012 1725   K 4.7 03/31/2022 0620   K 4.2 04/20/2012 1725   CL 109 03/31/2022 0620   CL 106 04/20/2012 1725   CO2 11 (L) 03/31/2022 0620   CO2 24 04/20/2012 1725   GLUCOSE 118 (H) 03/31/2022 0620   GLUCOSE 97 04/20/2012 1725   BUN 53 (H) 03/31/2022 0620   BUN 14 04/20/2012 1725   CREATININE 3.93 (H) 03/31/2022 0620   CREATININE 0.83 09/30/2019 1057   CALCIUM 8.3 (L) 03/31/2022 0620   CALCIUM 9.2 04/20/2012 1725   GFRNONAA 13 (L) 03/31/2022 0620   GFRNONAA 81 09/30/2019 1057   GFRAA 93 09/30/2019 1057   CBC    Component Value Date/Time   WBC 14.9 (H) 03/31/2022 0620   RBC 3.39 (L) 03/31/2022 0620   HGB 10.4 (L) 03/31/2022 0620   HGB 16.7 (H) 04/20/2012 1725   HCT 29.9 (L) 03/31/2022 0620   HCT 50.0 (H) 04/20/2012 1725   PLT 249 03/31/2022 0620   PLT 331 04/20/2012 1725   MCV 88.2 03/31/2022 0620   MCV 98 04/20/2012 1725   MCH 30.7 03/31/2022 0620   MCHC 34.8  03/31/2022 0620   RDW 17.2 (H) 03/31/2022 0620   RDW 14.2 04/20/2012 1725   LYMPHSABS 1.3 03/31/2022 0620   LYMPHSABS 2.7 08/27/2011 0558   MONOABS 0.3 03/31/2022 0620   MONOABS 0.5 08/27/2011 0558   EOSABS 0.4 03/31/2022 0620   EOSABS 0.5 08/27/2011 0558   BASOSABS 0.0 03/31/2022 0620   BASOSABS 0.1 08/27/2011 0558    Assessment/ Plan: AKI - crt of 0.8 in August of 2023, bland UA in October of 23.  Now with AKI , proteinuria and hematuria but also pyuria.  Seems most likely to be AKI from ATN in the setting of bacteremia. Given the proteinuria  and hematuria concerning for GN, some serologies were sent and were generally negative (see above). 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 SP CRRT 1/02- 1/11. Tx'd to [facility] 1/11. Her last HD session was 03/25/22 and right femoral HD catheter removed 03/26/22.  Despite IV contrast and ABLA, her UOP has markedly improved and her Scr was down to 3.93 today.  Unfortunately she had another CT angio today so will need to continue to follow UOP and daily labs.  Will likely require colectomy which could further complicate her renal recovery. UOP dropped off after ABLA and IV contrast.   No indication to resume IHD at this time and hopefully will start to see increased UOP with blood products. Will follow response and decide if she will need a tunneled catheter and ongoing HD.   ABLA with evidence of acute GI bleed - pt with hematochezia last night and this morning.  Receiving blood transfusion.  Unfortunately colonoscopy and CT angio x 2 could not isolate location of GIB, now pointing to diffuse colonic bleeding due to diffuse ulcerations and colitis.  Surgery consulted for possible colectomy.  She is continuing to have bleeding and was transfused 6 units PRBC's over the last 24 hours.     Hyperkalemia - lokelma x 2 and switching TF's to Nepro which has resolved issue. Volume - persistent infiltrates on CXR are  likely infection-related.  HTN - BP's have improved.  Back on scheduled meds.  Normocytic Anemia  - deferring IV iron in setting of infection.  Aranesp 40 mcg every Thursday.  Hgb dropped to 6.4 again this morning, will need blood transfusion.  Gi following for ABLA.  Strep bacteremia-  streptococcus infantarius, TEE negative, getting Rocephin thru 1/17. Colonoscopy when more stable per pmd.  Acute hypoxic respiratory failure - extubated on 1/10, on high level [place] O2 now. Repeat CXR's show persistent bilat infiltrates as above H/o polysubstance abuse  Donetta Potts, MD [facility]

## 2022-03-31 NOTE — Progress Notes (Signed)
Hypoglycemic Event  CBG: 63  Treatment: D50 25 mL (12.5 gm)  Symptoms: None  Follow-up CBG: Time:1219 CBG Result:114  Possible Reasons for Event: Inadequate meal intake  Comments/MD notified: Dr Pleas Patricia

## 2022-03-31 NOTE — Progress Notes (Signed)
Pt has had 7 large bright red bloody bowl movements since 2100, with 4 being in the last 2 hours. MD and Surgery notified. Pt has had 2 PRBCs and currently receiving 1 FFP Will recheck H&H post infusion.   Jackalyn Lombard

## 2022-03-31 NOTE — Progress Notes (Signed)
Progress Note   Subjective  Patient had colonoscopy yesterday showing diffuse colonic ulcers. Biopsies taken. She unfortunately had worsening bleeding overnight. Repeat CTA shows colitis but nondiagnostic for localization. Surgery consulted - she received 6 units PRBC overnight.    Objective   Vital signs in last 24 hours: Temp:  [97.2 F (36.2 C)-101.8 F (38.8 C)] 99.5 F (37.5 C) (01/21 0935) Pulse Rate:  [93-144] 108 (01/21 0935) Resp:  [13-34] 28 (01/21 0935) BP: (69-159)/(46-138) 147/97 (01/21 0845) SpO2:  [96 %-100 %] 98 % (01/21 0935) Weight:  [74.8 kg] 74.8 kg (01/21 0452) Last BM Date : 03/31/22 General:    white female lying in bed, tearful, blood in bed  Intake/Output from previous day: 01/20 0701 - 01/21 0700 In: 3174.6 [P.O.:240; I.V.:1229.2; Blood:1621; IV Piggyback:84.5] Out: 5200 [Urine:3800; Stool:1400] Intake/Output this shift: Total I/O In: 2073.4 [I.V.:113.1; Blood:1944.8; IV Piggyback:15.6] Out: -   Lab Results: Recent Labs    03/30/22 1313 03/30/22 2215 03/31/22 0102 03/31/22 0620  WBC 14.5* 10.6*  --  14.9*  HGB 7.4* 6.7* 7.5* 10.4*  HCT 21.6* 19.8* 22.5* 29.9*  PLT 238 244  --  249   BMET Recent Labs    03/29/22 2207 03/30/22 0348 03/31/22 0620  NA 134* 137 134*  K 4.8 5.3* 4.7  CL 109 111 109  CO2 14* 14* 11*  GLUCOSE 120* 113* 118*  BUN 63* 63* 53*  CREATININE 4.00* 4.24* 3.93*  CALCIUM 8.2* 8.5* 8.3*   LFT Recent Labs    03/31/22 0620  ALBUMIN 1.8*   PT/INR Recent Labs    03/30/22 2215 03/31/22 0620  LABPROT 16.6* 16.6*  INR 1.4* 1.4*    Studies/Results: CT ANGIO GI BLEED  Result Date: 03/31/2022 CLINICAL DATA:  Lower gastrointestinal hemorrhage EXAM: CTA ABDOMEN AND PELVIS WITHOUT AND WITH CONTRAST TECHNIQUE: Multidetector CT imaging of the abdomen and pelvis was performed using the standard protocol during bolus administration of intravenous contrast. Multiplanar reconstructed images and MIPs were  obtained and reviewed to evaluate the vascular anatomy. RADIATION DOSE REDUCTION: This exam was performed according to the departmental dose-optimization program which includes automated exposure control, adjustment of the mA and/or kV according to patient size and/or use of iterative reconstruction technique. CONTRAST:  144mL OMNIPAQUE IOHEXOL 350 MG/ML SOLN COMPARISON:  03/29/2022 FINDINGS: VASCULAR Aorta: Normal caliber aorta without aneurysm, dissection, vasculitis or significant stenosis. Mild atherosclerotic calcification Celiac: Variant anatomy with separate origin of the left gastric artery directly from the aorta. No evidence of hemodynamically significant stenosis. No aneurysm or dissection. SMA: Patent without evidence of aneurysm, dissection, vasculitis or significant stenosis. Renals: Both renal arteries are patent without evidence of aneurysm, dissection, vasculitis, fibromuscular dysplasia or significant stenosis. IMA: Patent without evidence of aneurysm, dissection, vasculitis or significant stenosis. Inflow: Patent without evidence of aneurysm, dissection, vasculitis or significant stenosis. Proximal Outflow: Bilateral common femoral and visualized portions of the superficial and profunda femoral arteries are patent without evidence of aneurysm, dissection, vasculitis or significant stenosis. Veins: No obvious venous abnormality within the limitations of this arterial phase study. Review of the MIP images confirms the above findings. NON-VASCULAR Lower chest: Patchy bibasilar ground-glass pulmonary infiltrate, most prevalent within the right middle lobe, is again identified, likely infectious or inflammatory but incompletely evaluated on this examination. No pleural effusion. Cardiac size is mildly enlarged. Hepatobiliary: No focal liver abnormality is seen. Status post cholecystectomy. No biliary dilatation. Pancreas: Unremarkable Spleen: Unremarkable Adrenals/Urinary Tract: The adrenal glands are  unremarkable. The kidneys are  normal in size and position. Bilateral punctate nonobstructing renal calculi are identified measuring up to 3 mm. No hydronephrosis. No ureteral calculi. No perinephric inflammatory stranding or fluid collections are seen. The bladder is decompressed with a Foley catheter balloon seen within its lumen. Stomach/Bowel: The colon is diffusely fluid-filled, as is the rectal vault. There is superimposed pericolonic inflammatory stranding and hyperemia involving the splenic flexure of the colon and ascending colon in keeping with changes of multifocal infectious or inflammatory colitis. No evidence of obstruction or perforation. No free intraperitoneal gas. Trace free fluid within the pelvis, unchanged, nonspecific. No active extravasation of contrast identified. The stomach and small bowel are unremarkable. Appendix normal. Lymphatic: No pathologic adenopathy within the abdomen and pelvis. Reproductive: Status post hysterectomy. No adnexal masses. Other: No abdominal wall hernia Musculoskeletal: Osseous structures are age-appropriate. No acute bone abnormality. No lytic or blastic bone lesion. IMPRESSION: 1. No active gastrointestinal hemorrhage identified. 2. Multifocal infectious or inflammatory colitis involving the splenic flexure of the colon and ascending colon. No evidence of obstruction or perforation. 3. Fluid-filled colon and rectal vault in keeping with a diarrheal state. 4. Patchy bibasilar ground-glass pulmonary infiltrate, likely infectious or inflammatory but incompletely evaluated on this examination. 5. Mild bilateral nonobstructing nephrolithiasis. 6. Aortic atherosclerosis. Aortic Atherosclerosis (ICD10-I70.0). Electronically Signed   By: Fidela Salisbury M.D.   On: 03/31/2022 03:17   DG Abd Portable 1V  Result Date: 03/30/2022 CLINICAL DATA:  OG tube placement EXAM: PORTABLE ABDOMEN - 1 VIEW COMPARISON:  CTA GI bleed 03/29/2022 FINDINGS: PICC line tip projects over the  superior vena cava. Enteric tube tip projects over the left upper quadrant. Cholecystectomy clips. Gaseous distended loops of bowel throughout the visualized upper abdomen. IMPRESSION: Enteric tube tip projects over the left upper quadrant. Electronically Signed   By: Lovey Newcomer M.D.   On: 03/30/2022 17:11   CT ANGIO GI BLEED  Result Date: 03/29/2022 CLINICAL DATA:  57 year old female with lower GI bleed. EXAM: CTA ABDOMEN AND PELVIS WITHOUT AND WITH CONTRAST TECHNIQUE: Multidetector CT imaging of the abdomen and pelvis was performed using the standard protocol during bolus administration of intravenous contrast. Multiplanar reconstructed images and MIPs were obtained and reviewed to evaluate the vascular anatomy. RADIATION DOSE REDUCTION: This exam was performed according to the departmental dose-optimization program which includes automated exposure control, adjustment of the mA and/or kV according to patient size and/or use of iterative reconstruction technique. CONTRAST:  72mL OMNIPAQUE IOHEXOL 350 MG/ML SOLN COMPARISON:  CT abdomen and pelvis with oral contrast on 03/29/2022 and CT from 03/15/2022 FINDINGS: VASCULAR Aorta: Minimal atherosclerotic disease in the abdominal aorta without aneurysm, dissection or significant stenosis. Celiac: Patent without evidence of aneurysm, dissection, vasculitis or significant stenosis. SMA: Patent without evidence of aneurysm, dissection, vasculitis or significant stenosis. Renals: Both renal arteries are patent without evidence of aneurysm, dissection, vasculitis, fibromuscular dysplasia or significant stenosis. IMA: Patent without evidence of aneurysm, dissection, vasculitis or significant stenosis. Inflow: Patent without evidence of aneurysm, dissection, vasculitis or significant stenosis. Proximal Outflow: Proximal femoral arteries are patent bilaterally. Veins: Portal venous system is patent. IVC and renal veins are patent. Main iliac veins are patent. Proximal  femoral veins are patent. Review of the MIP images confirms the above findings. NON-VASCULAR Lower chest: Residual patchy densities in the right middle lobe. No pleural effusions. Hepatobiliary: Cholecystectomy. Normal appearance of the liver without biliary dilatation. Pancreas: Unremarkable. No pancreatic ductal dilatation or surrounding inflammatory changes. Spleen: Normal in size without focal abnormality. Adrenals/Urinary Tract: Normal adrenal  glands. At least 2 small left renal calculi, largest measures 2 mm. 3 mm calculus in the right kidney the upper/mid pole region. Probable cyst in the right kidney lower pole that does not require dedicated follow-up. Questionable 6 mm fat attenuating structure along the right kidney upper pole on image 44/5. This could be related to a cortical scar/cleft versus a small fat containing structure such as a tiny angiomyolipoma. This is not significantly changed since 2011 and does not require dedicated follow-up. No hydronephrosis. Urinary bladder is decompressed with a Foley catheter. No ureter dilatation. Stomach/Bowel: Nasogastric tube terminates in the stomach body. Limited evaluation for active GI bleeding because there is oral contrast in the small and large bowel. Mild focal wall thickening in the colon near the splenic flexure on image 53/54. Mild pericolonic stranding involving the ascending colon. There is contrast within the normal appearing appendix. Contrast in the distal small bowel without bowel dilatation or obstruction. Normal appearance of the stomach. Lymphatic: Again noted are small periaortic lymph nodes in the abdomen. No significant lymph node enlargement in the abdomen or pelvis. Reproductive: Uterus appears to be surgically absent. No evidence for an adnexal mass. Other: Diffuse subcutaneous edema. Trace fluid in the pelvis and perihepatic region. Negative for free air. Musculoskeletal: No acute bone abnormality. Scattered subcutaneous densities most  likely related to prior injection sites. IMPRESSION: VASCULAR 1. Minimal atherosclerotic disease in the abdomen or pelvis. Aorta and main visceral arteries are patent without significant stenosis. NON-VASCULAR 1. Very limited evaluation for active GI bleeding because there was oral contrast within the distal small bowel and throughout the colon. Active GI bleeding cannot be assessed on this examination. 2. Residual mild colonic wall thickening most prominent near the splenic fracture. There may be mild colonic wall thickening in the right colon. Findings are compatible with residual or persistent colitis. 3. Nonobstructive renal calculi. 4. Diffuse subcutaneous edema. Electronically Signed   By: Markus Daft M.D.   On: 03/29/2022 14:19       Assessment / Plan:    57 y/o female here with the following:  Lower GI bleed secondary to diffuse colonic ulcerations Hemorrhagic anemia Prolonged hospitalization for sepsis / respiratory failure / AKI on HD  Unfortunately the patient has worsened since I have last seen her.  Recall she initially had a CTA for initial severe bleeding which was nondiagnostic.  Colonoscopy done yesterday following bowel prep, diffuse colonic ulcers, worse in the right colon.  She had significant blood and clot burden which I could not clear to identify source of bleeding.  Unclear if she is having diffuse bleeding from these ulcerations versus focal ulceration as clot burden limited evaluation. I suspect more so diffuse bleeding given repeat CTA overnight was negative at time of bleeding. She has been evaluated by surgery for possible colectomy.  Unfortunately given repeated IV contrast load she is at high risk for worsening renal function and dialysis.  Discussed the situation with patient and family at bedside.  In regards to the cause of this, certainly viral and ischemic are probably highest on the differential. Pattern / distribution of ulcers would be atypical for ischemia. Drug  reaction possible but she does not take a high risk med to do this. I doubt this is Crohn's disease given acute onset.  Biopsies taken but they will not be back for a few days yet.  We can send CMV PCR to rule out CMV, she is not immunosuppressed.  Unfortunately colonoscopy and IR embolization not likely  to provide hemostasis in this situation, and yield of a repeat colonoscopy would be extremely low.  We either continue to support her with blood transfusion and see if she resolves this on her own with more time while we await to clarify etiology / biopsies, versus proceeding to colectomy for active severe bleeding.  Given her course overnight with 6 additional units of blood, colectomy is reasonable next step to stop severe bleeding, albeit she is high risk for surgery. Surgeons to be by to see the patient this AM. She is critically ill, at risk for mortality from this issue. If her bleeding persists as it has overnight, surgery is probably her best option. Discussed at length with the family who agree.  RECOMMEND: - await surgical evaluation for possible colectomy today - await pathology results, hopefully can get a read tomorrow but may be Tuesday - CMV PCR ordered - trend Hgb, transfuse PRBC as needed  Call with questions.  Jolly Mango, MD [facility]

## 2022-03-31 NOTE — Progress Notes (Signed)
1 Day Post-Op   Subjective/Chief Complaint: Required transfusion this AM after 6 bloody stools with bright red blood.  Hemodynamically stable throughout this.  Started on low dose precedex given dementia and agitation.     Objective: Vital signs in last 24 hours: Temp:  [97.2 F (36.2 C)-101.8 F (38.8 C)] 99.3 F (37.4 C) (01/21 1105) Pulse Rate:  [93-144] 114 (01/21 1105) Resp:  [13-34] 26 (01/21 1105) BP: (69-159)/(46-138) 146/110 (01/21 1100) SpO2:  [96 %-100 %] 98 % (01/21 1105) Weight:  [74.8 kg] 74.8 kg (01/21 0452) Last BM Date : 03/31/22  Intake/Output from previous day: 01/20 0701 - 01/21 0700 In: 3174.6 [P.O.:240; I.V.:1229.2; Blood:1621; IV Piggyback:84.5] Out: 5200 [Urine:3800; Stool:1400] Intake/Output this shift: Total I/O In: 2083.4 [I.V.:123.1; Blood:1944.8; IV Piggyback:15.6] Out: -   PE:  General appearance: alert, cooperative, and mild distress Resp: clear to auscultation bilaterally Cardio: regular, tachycardic GI: soft, non mild distention, no tenderness Extremities: extremities normal, atraumatic, no cyanosis or edema  Lab Results:  Recent Labs    03/30/22 2215 03/31/22 0102 03/31/22 0620  WBC 10.6*  --  14.9*  HGB 6.7* 7.5* 10.4*  HCT 19.8* 22.5* 29.9*  PLT 244  --  249   BMET Recent Labs    03/30/22 0348 03/31/22 0620  NA 137 134*  K 5.3* 4.7  CL 111 109  CO2 14* 11*  GLUCOSE 113* 118*  BUN 63* 53*  CREATININE 4.24* 3.93*  CALCIUM 8.5* 8.3*   PT/INR Recent Labs    03/30/22 2215 03/31/22 0620  LABPROT 16.6* 16.6*  INR 1.4* 1.4*   ABG No results for input(s): "PHART", "HCO3" in the last 72 hours.  Invalid input(s): "PCO2", "PO2"  Studies/Results: CT ANGIO GI BLEED  Result Date: 03/31/2022 CLINICAL DATA:  Lower gastrointestinal hemorrhage EXAM: CTA ABDOMEN AND PELVIS WITHOUT AND WITH CONTRAST TECHNIQUE: Multidetector CT imaging of the abdomen and pelvis was performed using the standard protocol during bolus  administration of intravenous contrast. Multiplanar reconstructed images and MIPs were obtained and reviewed to evaluate the vascular anatomy. RADIATION DOSE REDUCTION: This exam was performed according to the departmental dose-optimization program which includes automated exposure control, adjustment of the mA and/or kV according to patient size and/or use of iterative reconstruction technique. CONTRAST:  159mL OMNIPAQUE IOHEXOL 350 MG/ML SOLN COMPARISON:  03/29/2022 FINDINGS: VASCULAR Aorta: Normal caliber aorta without aneurysm, dissection, vasculitis or significant stenosis. Mild atherosclerotic calcification Celiac: Variant anatomy with separate origin of the left gastric artery directly from the aorta. No evidence of hemodynamically significant stenosis. No aneurysm or dissection. SMA: Patent without evidence of aneurysm, dissection, vasculitis or significant stenosis. Renals: Both renal arteries are patent without evidence of aneurysm, dissection, vasculitis, fibromuscular dysplasia or significant stenosis. IMA: Patent without evidence of aneurysm, dissection, vasculitis or significant stenosis. Inflow: Patent without evidence of aneurysm, dissection, vasculitis or significant stenosis. Proximal Outflow: Bilateral common femoral and visualized portions of the superficial and profunda femoral arteries are patent without evidence of aneurysm, dissection, vasculitis or significant stenosis. Veins: No obvious venous abnormality within the limitations of this arterial phase study. Review of the MIP images confirms the above findings. NON-VASCULAR Lower chest: Patchy bibasilar ground-glass pulmonary infiltrate, most prevalent within the right middle lobe, is again identified, likely infectious or inflammatory but incompletely evaluated on this examination. No pleural effusion. Cardiac size is mildly enlarged. Hepatobiliary: No focal liver abnormality is seen. Status post cholecystectomy. No biliary dilatation.  Pancreas: Unremarkable Spleen: Unremarkable Adrenals/Urinary Tract: The adrenal glands are unremarkable. The kidneys are normal  in size and position. Bilateral punctate nonobstructing renal calculi are identified measuring up to 3 mm. No hydronephrosis. No ureteral calculi. No perinephric inflammatory stranding or fluid collections are seen. The bladder is decompressed with a Foley catheter balloon seen within its lumen. Stomach/Bowel: The colon is diffusely fluid-filled, as is the rectal vault. There is superimposed pericolonic inflammatory stranding and hyperemia involving the splenic flexure of the colon and ascending colon in keeping with changes of multifocal infectious or inflammatory colitis. No evidence of obstruction or perforation. No free intraperitoneal gas. Trace free fluid within the pelvis, unchanged, nonspecific. No active extravasation of contrast identified. The stomach and small bowel are unremarkable. Appendix normal. Lymphatic: No pathologic adenopathy within the abdomen and pelvis. Reproductive: Status post hysterectomy. No adnexal masses. Other: No abdominal wall hernia Musculoskeletal: Osseous structures are age-appropriate. No acute bone abnormality. No lytic or blastic bone lesion. IMPRESSION: 1. No active gastrointestinal hemorrhage identified. 2. Multifocal infectious or inflammatory colitis involving the splenic flexure of the colon and ascending colon. No evidence of obstruction or perforation. 3. Fluid-filled colon and rectal vault in keeping with a diarrheal state. 4. Patchy bibasilar ground-glass pulmonary infiltrate, likely infectious or inflammatory but incompletely evaluated on this examination. 5. Mild bilateral nonobstructing nephrolithiasis. 6. Aortic atherosclerosis. Aortic Atherosclerosis (ICD10-I70.0). Electronically Signed   By: Fidela Salisbury M.D.   On: 03/31/2022 03:17   DG Abd Portable 1V  Result Date: 03/30/2022 CLINICAL DATA:  OG tube placement EXAM: PORTABLE  ABDOMEN - 1 VIEW COMPARISON:  CTA GI bleed 03/29/2022 FINDINGS: PICC line tip projects over the superior vena cava. Enteric tube tip projects over the left upper quadrant. Cholecystectomy clips. Gaseous distended loops of bowel throughout the visualized upper abdomen. IMPRESSION: Enteric tube tip projects over the left upper quadrant. Electronically Signed   By: Lovey Newcomer M.D.   On: 03/30/2022 17:11   CT ANGIO GI BLEED  Result Date: 03/29/2022 CLINICAL DATA:  57 year old female with lower GI bleed. EXAM: CTA ABDOMEN AND PELVIS WITHOUT AND WITH CONTRAST TECHNIQUE: Multidetector CT imaging of the abdomen and pelvis was performed using the standard protocol during bolus administration of intravenous contrast. Multiplanar reconstructed images and MIPs were obtained and reviewed to evaluate the vascular anatomy. RADIATION DOSE REDUCTION: This exam was performed according to the departmental dose-optimization program which includes automated exposure control, adjustment of the mA and/or kV according to patient size and/or use of iterative reconstruction technique. CONTRAST:  4mL OMNIPAQUE IOHEXOL 350 MG/ML SOLN COMPARISON:  CT abdomen and pelvis with oral contrast on 03/29/2022 and CT from 03/15/2022 FINDINGS: VASCULAR Aorta: Minimal atherosclerotic disease in the abdominal aorta without aneurysm, dissection or significant stenosis. Celiac: Patent without evidence of aneurysm, dissection, vasculitis or significant stenosis. SMA: Patent without evidence of aneurysm, dissection, vasculitis or significant stenosis. Renals: Both renal arteries are patent without evidence of aneurysm, dissection, vasculitis, fibromuscular dysplasia or significant stenosis. IMA: Patent without evidence of aneurysm, dissection, vasculitis or significant stenosis. Inflow: Patent without evidence of aneurysm, dissection, vasculitis or significant stenosis. Proximal Outflow: Proximal femoral arteries are patent bilaterally. Veins: Portal  venous system is patent. IVC and renal veins are patent. Main iliac veins are patent. Proximal femoral veins are patent. Review of the MIP images confirms the above findings. NON-VASCULAR Lower chest: Residual patchy densities in the right middle lobe. No pleural effusions. Hepatobiliary: Cholecystectomy. Normal appearance of the liver without biliary dilatation. Pancreas: Unremarkable. No pancreatic ductal dilatation or surrounding inflammatory changes. Spleen: Normal in size without focal abnormality. Adrenals/Urinary Tract: Normal adrenal glands.  At least 2 small left renal calculi, largest measures 2 mm. 3 mm calculus in the right kidney the upper/mid pole region. Probable cyst in the right kidney lower pole that does not require dedicated follow-up. Questionable 6 mm fat attenuating structure along the right kidney upper pole on image 44/5. This could be related to a cortical scar/cleft versus a small fat containing structure such as a tiny angiomyolipoma. This is not significantly changed since 2011 and does not require dedicated follow-up. No hydronephrosis. Urinary bladder is decompressed with a Foley catheter. No ureter dilatation. Stomach/Bowel: Nasogastric tube terminates in the stomach body. Limited evaluation for active GI bleeding because there is oral contrast in the small and large bowel. Mild focal wall thickening in the colon near the splenic flexure on image 53/54. Mild pericolonic stranding involving the ascending colon. There is contrast within the normal appearing appendix. Contrast in the distal small bowel without bowel dilatation or obstruction. Normal appearance of the stomach. Lymphatic: Again noted are small periaortic lymph nodes in the abdomen. No significant lymph node enlargement in the abdomen or pelvis. Reproductive: Uterus appears to be surgically absent. No evidence for an adnexal mass. Other: Diffuse subcutaneous edema. Trace fluid in the pelvis and perihepatic region. Negative  for free air. Musculoskeletal: No acute bone abnormality. Scattered subcutaneous densities most likely related to prior injection sites. IMPRESSION: VASCULAR 1. Minimal atherosclerotic disease in the abdomen or pelvis. Aorta and main visceral arteries are patent without significant stenosis. NON-VASCULAR 1. Very limited evaluation for active GI bleeding because there was oral contrast within the distal small bowel and throughout the colon. Active GI bleeding cannot be assessed on this examination. 2. Residual mild colonic wall thickening most prominent near the splenic fracture. There may be mild colonic wall thickening in the right colon. Findings are compatible with residual or persistent colitis. 3. Nonobstructive renal calculi. 4. Diffuse subcutaneous edema. Electronically Signed   By: Markus Daft M.D.   On: 03/29/2022 14:19    Anti-infectives: Anti-infectives (From admission, onward)    Start     Dose/Rate Route Frequency Ordered Stop   03/31/22 0600  piperacillin-tazobactam (ZOSYN) IVPB 2.25 g       See Hyperspace for full Linked Orders Report.   2.25 g 100 mL/hr over 30 Minutes Intravenous Every 8 hours 03/30/22 1801     03/30/22 1845  piperacillin-tazobactam (ZOSYN) IVPB 3.375 g       See Hyperspace for full Linked Orders Report.   3.375 g 100 mL/hr over 30 Minutes Intravenous  Once 03/30/22 1801 03/30/22 1850   03/21/22 1000  cefTRIAXone (ROCEPHIN) 2 g in sodium chloride 0.9 % 100 mL IVPB        2 g 200 mL/hr over 30 Minutes Intravenous Every 24 hours 03/21/22 0740 03/28/22 0326   03/20/22 1200  piperacillin-tazobactam (ZOSYN) IVPB 3.375 g  Status:  Discontinued        3.375 g 12.5 mL/hr over 240 Minutes Intravenous Every 8 hours 03/20/22 0921 03/21/22 0740   03/19/22 1200  piperacillin-tazobactam (ZOSYN) IVPB 2.25 g  Status:  Discontinued        2.25 g 100 mL/hr over 30 Minutes Intravenous Every 8 hours 03/19/22 0931 03/20/22 0921   03/18/22 2000  piperacillin-tazobactam (ZOSYN) IVPB  3.375 g  Status:  Discontinued        3.375 g 12.5 mL/hr over 240 Minutes Intravenous Every 8 hours 03/18/22 1355 03/19/22 0931   03/16/22 1015  piperacillin-tazobactam (ZOSYN) IVPB 3.375 g  Status:  Discontinued  3.375 g 100 mL/hr over 30 Minutes Intravenous Every 6 hours 03/16/22 1007 03/18/22 1355   03/16/22 1000  piperacillin-tazobactam (ZOSYN) IVPB 3.375 g  Status:  Discontinued        3.375 g 12.5 mL/hr over 240 Minutes Intravenous Every 8 hours 03/16/22 0949 03/16/22 1007   03/12/22 1800  ampicillin (OMNIPEN) 2 g in sodium chloride 0.9 % 100 mL IVPB  Status:  Discontinued        2 g 300 mL/hr over 20 Minutes Intravenous Every 6 hours 03/12/22 1629 03/16/22 0919   03/12/22 1200  ampicillin (OMNIPEN) 2 g in sodium chloride 0.9 % 100 mL IVPB  Status:  Discontinued        2 g 300 mL/hr over 20 Minutes Intravenous 2 times daily 03/12/22 0945 03/12/22 1629   03/12/22 0600  vancomycin (VANCOREADY) IVPB 500 mg/100 mL  Status:  Discontinued        500 mg 100 mL/hr over 60 Minutes Intravenous Every 48 hours 03/11/22 2256 03/11/22 2307   03/11/22 2344  vancomycin variable dose per unstable renal function (pharmacist dosing)  Status:  Discontinued         Does not apply See admin instructions 03/11/22 2344 03/12/22 0945   03/11/22 2315  vancomycin (VANCOREADY) IVPB 1750 mg/350 mL  Status:  Discontinued        1,750 mg 175 mL/hr over 120 Minutes Intravenous  Once 03/11/22 2220 03/11/22 2250   03/10/22 1200  cefTRIAXone (ROCEPHIN) 2 g in sodium chloride 0.9 % 100 mL IVPB  Status:  Discontinued        2 g 200 mL/hr over 30 Minutes Intravenous Every 24 hours 03/10/22 0934 03/12/22 0945   03/10/22 0645  vancomycin (VANCOREADY) IVPB 1500 mg/300 mL        1,500 mg 150 mL/hr over 120 Minutes Intravenous  Once 03/10/22 0547 03/10/22 0848   03/10/22 0555  vancomycin variable dose per unstable renal function (pharmacist dosing)  Status:  Discontinued         Does not apply See admin instructions  03/10/22 0555 03/10/22 0931   03/10/22 0400  ceFEPIme (MAXIPIME) 2 g in sodium chloride 0.9 % 100 mL IVPB  Status:  Discontinued        2 g 200 mL/hr over 30 Minutes Intravenous Every 24 hours 03/10/22 0303 03/10/22 0931   03/10/22 0345  metroNIDAZOLE (FLAGYL) IVPB 500 mg  Status:  Discontinued        500 mg 100 mL/hr over 60 Minutes Intravenous Every 12 hours 03/10/22 0246 03/10/22 0931       Assessment/Plan: s/p Procedure(s): COLONOSCOPY WITH PROPOFOL (N/A) BIOPSY ESOPHAGOGASTRODUODENOSCOPY (EGD) WITH PROPOFOL (N/A) Gastroenteritis.   GI bleed Dementia Acute renal failure Recent respiratory failure DM   Discussed total colectomy with patient and family. However there is risk of surgery not fixing the problem if there is continued bleeding from ulcerations in the small bowel as well.    Total colectomy commits her to additional operation for ileostomy takedown.   Ileostomy has high risk of dehydration, acute kidney injury, and readmission to the hospital.   Additionally, surgery carries high risk with general anesthesia and worsening of dementia and post op delirium.    Would hold off on surgery unless absolutely necessary.  Would transfuse to maintain stability.    This appears to be from an infections self limiting process given fever and time course.   ACS RISK CALCULATOR USE:  Risk Calculator was used for discussion of surgery: Yes  LOS: 21 days    Darlene Barber 03/31/2022

## 2022-04-01 ENCOUNTER — Encounter (HOSPITAL_COMMUNITY): Payer: Self-pay | Admitting: Gastroenterology

## 2022-04-01 DIAGNOSIS — R6521 Severe sepsis with septic shock: Secondary | ICD-10-CM | POA: Diagnosis not present

## 2022-04-01 DIAGNOSIS — A419 Sepsis, unspecified organism: Secondary | ICD-10-CM | POA: Diagnosis not present

## 2022-04-01 LAB — TYPE AND SCREEN
ABO/RH(D): A POS
Antibody Screen: NEGATIVE
Unit division: 0
Unit division: 0
Unit division: 0
Unit division: 0
Unit division: 0
Unit division: 0
Unit division: 0
Unit division: 0
Unit division: 0
Unit division: 0
Unit division: 0
Unit division: 0
Unit division: 0
Unit division: 0

## 2022-04-01 LAB — BPAM FFP
Blood Product Expiration Date: 202401242359
Blood Product Expiration Date: 202401262359
Blood Product Expiration Date: 202401262359
ISSUE DATE / TIME: 202401210436
ISSUE DATE / TIME: 202401210728
ISSUE DATE / TIME: 202401210728
Unit Type and Rh: 6200
Unit Type and Rh: 6200
Unit Type and Rh: 6200

## 2022-04-01 LAB — CULTURE, BLOOD (ROUTINE X 2)
Culture: NO GROWTH
Special Requests: ADEQUATE

## 2022-04-01 LAB — GLUCOSE, CAPILLARY
Glucose-Capillary: 104 mg/dL — ABNORMAL HIGH (ref 70–99)
Glucose-Capillary: 107 mg/dL — ABNORMAL HIGH (ref 70–99)
Glucose-Capillary: 94 mg/dL (ref 70–99)
Glucose-Capillary: 92 mg/dL (ref 70–99)
Glucose-Capillary: 147 mg/dL — ABNORMAL HIGH (ref 70–99)
Glucose-Capillary: 85 mg/dL (ref 70–99)

## 2022-04-01 LAB — CBC
HCT: 34.2 % — ABNORMAL LOW (ref 36.0–46.0)
HCT: 39.3 % (ref 36.0–46.0)
HCT: 33 % — ABNORMAL LOW (ref 36.0–46.0)
Hemoglobin: 11.6 g/dL — ABNORMAL LOW (ref 12.0–15.0)
Hemoglobin: 13.1 g/dL (ref 12.0–15.0)
Hemoglobin: 11.4 g/dL — ABNORMAL LOW (ref 12.0–15.0)
MCH: 30 pg (ref 26.0–34.0)
MCH: 29.6 pg (ref 26.0–34.0)
MCH: 30.2 pg (ref 26.0–34.0)
MCHC: 33.9 g/dL (ref 30.0–36.0)
MCHC: 33.3 g/dL (ref 30.0–36.0)
MCHC: 34.5 g/dL (ref 30.0–36.0)
MCV: 88.4 fL (ref 80.0–100.0)
MCV: 88.7 fL (ref 80.0–100.0)
MCV: 87.3 fL (ref 80.0–100.0)
Platelets: 312 K/uL (ref 150–400)
Platelets: 382 10*3/uL (ref 150–400)
Platelets: 360 10*3/uL (ref 150–400)
RBC: 3.87 MIL/uL (ref 3.87–5.11)
RBC: 4.43 MIL/uL (ref 3.87–5.11)
RBC: 3.78 MIL/uL — ABNORMAL LOW (ref 3.87–5.11)
RDW: 17.4 % — ABNORMAL HIGH (ref 11.5–15.5)
RDW: 17.7 % — ABNORMAL HIGH (ref 11.5–15.5)
RDW: 17.4 % — ABNORMAL HIGH (ref 11.5–15.5)
WBC: 12.7 K/uL — ABNORMAL HIGH (ref 4.0–10.5)
WBC: 15.2 10*3/uL — ABNORMAL HIGH (ref 4.0–10.5)
WBC: 14.2 10*3/uL — ABNORMAL HIGH (ref 4.0–10.5)
nRBC: 3.5 % — ABNORMAL HIGH (ref 0.0–0.2)
nRBC: 2.3 % — ABNORMAL HIGH (ref 0.0–0.2)
nRBC: 2.3 % — ABNORMAL HIGH (ref 0.0–0.2)

## 2022-04-01 LAB — BPAM RBC
Blood Product Expiration Date: 202401242359
Blood Product Expiration Date: 202402112359
Blood Product Expiration Date: 202402112359
Blood Product Expiration Date: 202402142359
Blood Product Expiration Date: 202402152359
Blood Product Expiration Date: 202402152359
Blood Product Expiration Date: 202402152359
Blood Product Expiration Date: 202402152359
Blood Product Expiration Date: 202402162359
Blood Product Expiration Date: 202402162359
Blood Product Expiration Date: 202402162359
Blood Product Expiration Date: 202402162359
Blood Product Expiration Date: 202402162359
Blood Product Expiration Date: 202402162359
ISSUE DATE / TIME: 202401181358
ISSUE DATE / TIME: 202401181811
ISSUE DATE / TIME: 202401190426
ISSUE DATE / TIME: 202401190857
ISSUE DATE / TIME: 202401200700
ISSUE DATE / TIME: 202401202223
ISSUE DATE / TIME: 202401210324
ISSUE DATE / TIME: 202401210436
ISSUE DATE / TIME: 202401210622
ISSUE DATE / TIME: 202401210622
ISSUE DATE / TIME: 202401210622
ISSUE DATE / TIME: 202401210622
Unit Type and Rh: 600
Unit Type and Rh: 6200
Unit Type and Rh: 6200
Unit Type and Rh: 6200
Unit Type and Rh: 6200
Unit Type and Rh: 6200
Unit Type and Rh: 6200
Unit Type and Rh: 6200
Unit Type and Rh: 6200
Unit Type and Rh: 6200
Unit Type and Rh: 6200
Unit Type and Rh: 6200
Unit Type and Rh: 6200
Unit Type and Rh: 6200

## 2022-04-01 LAB — PREPARE FRESH FROZEN PLASMA
Unit division: 0
Unit division: 0

## 2022-04-01 LAB — BPAM PLATELET PHERESIS
Blood Product Expiration Date: 202401222359
ISSUE DATE / TIME: 202401210914
Unit Type and Rh: 6200

## 2022-04-01 LAB — RENAL FUNCTION PANEL
Albumin: 1.7 g/dL — ABNORMAL LOW (ref 3.5–5.0)
Anion gap: 13 (ref 5–15)
BUN: 49 mg/dL — ABNORMAL HIGH (ref 6–20)
CO2: 18 mmol/L — ABNORMAL LOW (ref 22–32)
Calcium: 8.2 mg/dL — ABNORMAL LOW (ref 8.9–10.3)
Chloride: 111 mmol/L (ref 98–111)
Creatinine, Ser: 4.17 mg/dL — ABNORMAL HIGH (ref 0.44–1.00)
GFR, Estimated: 12 mL/min — ABNORMAL LOW (ref >60)
Glucose, Bld: 93 mg/dL (ref 70–99)
Phosphorus: 9 mg/dL — ABNORMAL HIGH (ref 2.5–4.6)
Potassium: 4.5 mmol/L (ref 3.5–5.1)
Sodium: 142 mmol/L (ref 135–145)

## 2022-04-01 LAB — PREPARE PLATELET PHERESIS: Unit division: 0

## 2022-04-01 MED ORDER — QUETIAPINE FUMARATE 100 MG PO TABS
300.0000 mg | ORAL_TABLET | Freq: Every day | ORAL | Status: DC
  Administered 2022-04-01 – 2022-04-17 (×17): 300 mg via ORAL
  Filled 2022-04-01 (×17): qty 3

## 2022-04-01 MED ORDER — LACTATED RINGERS IV SOLN: INTRAVENOUS | Status: DC

## 2022-04-01 MED ORDER — NORTRIPTYLINE HCL 25 MG PO CAPS
50.0000 mg | ORAL_CAPSULE | Freq: Every day | ORAL | Status: DC
  Administered 2022-04-01 – 2022-04-22 (×22): 50 mg via ORAL
  Filled 2022-04-01 (×24): qty 2

## 2022-04-01 MED ORDER — MAGNESIUM SULFATE 2 GM/50ML IV SOLN
2.0000 g | Freq: Once | INTRAVENOUS | Status: AC
  Administered 2022-04-01: 2 g via INTRAVENOUS
  Filled 2022-04-01: qty 50

## 2022-04-01 MED ORDER — ORAL CARE MOUTH RINSE: 15.0000 mL | OROMUCOSAL | Status: DC | PRN

## 2022-04-01 NOTE — Progress Notes (Signed)
PT Cancellation Note  Patient Details Name: Darlene Barber MRN: [id number] DOB: 1965/05/18   Cancelled Treatment:    Reason Eval/Treat Not Completed: Patient declined, no reason specified - pt states she cannot mobilize with PT today, states "everyone is too scared for me to move", "I am too tired", "I will tomorrow". PT to check back then per pt request.   Stacie Glaze, PT DPT Acute Rehabilitation Services Pager [phone]  Office [phone]    Louis Matte 04/01/2022, 3:08 PM

## 2022-04-01 NOTE — Progress Notes (Signed)
SLP Cancellation Note  Patient Details Name: Darlene Barber MRN: [id number] DOB: 28-Feb-1966   Cancelled treatment:       Reason Eval/Treat Not Completed: Medical issues which prohibited therapy; pt remains NPO for medical concerns; potential sx recommendation. Nursing staff stated pt tolerating medications with liquids without concern.  ST will continue efforts.   Pat Majesty Stehlin,M.S., CCC-SLP 04/01/2022, 1:16 PM

## 2022-04-01 NOTE — Progress Notes (Signed)
NAME:  Darlene Barber, MRN:  [id number], DOB:  1965/12/31, LOS: 23 ADMISSION DATE:  03/10/2022, CONSULTATION DATE:  12/30  REFERRING MD:  Dr. Cinda Quest, CHIEF COMPLAINT:  Abdominal Pain    History of Present Illness:  [name] who presented to the [facility] ER on 12/31 with reports of abdominal pain and widespread rash.  On admission, the patient significant other reported that the day after eating at a local restaurant in [place], [place] she developed abdominal pain and rash.  She was reportedly up all night with crampy abdominal pain associated with nausea and vomiting.  Around 2 PM on the day of admission she noticed a rash all over her body and had worsening pain prompting him to call EMS.  She was evaluated in the emergency room and was found to have oxygen saturations of 87% on 2 L, afebrile with an elevated white blood cell count of 29.2.  Additional labs notable for acute kidney injury, anion gap metabolic acidosis and LFTs with mixed hepatic and cholestatic picture.  Stool samples were sent with workup of acute diarrhea.  Stool pathogen panel was negative.  C. difficile toxin was negative.  She was treated with 30 mL/kg IV fluid and started on empiric broad-spectrum antibiotics with vancomycin, cefepime and Flagyl.  She remained hypotensive despite IV fluid boluses and was started on vasopressors.  She was transferred to [facility] as there were no inpatient beds at [facility].    PCCM consulted for ICU admission.  Pertinent  Medical History  Chronic Pain Syndrome  Cervical Spondylitis  GERD  Bipolar Disorder  Tobacco Abuse - daily  THC Abuse - daily   Significant Hospital Events: Including procedures, antibiotic start and stop dates in addition to other pertinent events   12/31 admit with abdominal pain, diarrhea.  Left IJ TLC placement.  UDS positive for tricyclics, THC and amphetamines.  C. difficile negative, GI PCR negative.  COVID, flu, RSV negative.  Antibiotics narrowed to Zosyn.  BC positives for strep infantarius.  CT chest abdomen pelvis with fluid throughout the stomach, small bowel and colon, nonspecific but may represent diarrheal state.  No bowel obstruction, definite bowel wall thickening or pneumoperitoneum.  Fluid within the esophagus may represent reflux or nonspecific esophageal motility disorder.  Trace amount of free pelvic fluid, nonspecific.   1/3 CRRT, PEEP 5/60% 1/4 CRRT with negative balance. Precedex added to Fentanyl.  1/5 CT chest abd pelvis, TEE done with no evidence of vegetations 1/8 Taken off CRRT, MIVF started 1/9 too wild on precedex, severe HTN prevented extubation 1/10 restarted on CRRT 1/11 extubated, transferred from [facility] for [facility]HD 1/13 Clonazepam 0.5 twice daily, Seroquel to twice daily 1/14 Precedex weaned to off this morning 03/29/2022 critical care called back for new GI bleed transferred back to intensive care unit GI has been consulted 1/20 continue GI bleed overnight, hemoglobin dropped to 6.41 unit PRBCs ordered 1/21 recurrent GI bleed overnight with additional PRBCs, platelets, and FFP ordered.  Decision made to engage general surgery again.  Interim History / Subjective:   Continues to have bright red blood per rectum, no significant decline in her H/H Patient remains on precedex for agitation  Objective   Blood pressure 123/86, pulse 95, temperature 98.2 F (36.8 C), temperature source Oral, resp. rate 19, height 4\' 10"  (1.473 m), weight 73.9 kg, SpO2 96 %.        Intake/Output Summary (Last 24 hours) at 04/01/2022 0851 Last data filed at 04/01/2022 0700 Gross per  24 hour  Intake 617.46 ml  Output 2575 ml  Net -1957.54 ml   Filed Weights   03/28/22 0500 03/31/22 0452 04/01/22 0400  Weight: 77.6 kg 74.8 kg 73.9 kg    Examination: General: Acute on chronic ill-appearing elderly female lying in bed on in no acute distress HEENT: [place]/AT, MM pink/moist, PERRL,  Neuro: Alert to self, confused to situation and place CV:  s1s2 regular rate and rhythm, no murmur, rubs, or gallops,  PULM: Clear to auscultation bilaterally, no increased work of breathing, no added breath sounds GI: soft, bowel sounds active in all 4 quadrants, non-tender, non-distended Extremities: warm/dry, no edema  Skin: no rashes or lesions  Resolved Hospital Problem list   Hypotension - sepsis + sedation  Transaminitis Shock Liver Rash, Hives TCP related to CRRT, sepsis Multifocal pneumonia, possible VAP, completed course 1/11 Possible melena  Assessment & Plan:  GI bleeding in the setting of multiple colonic ulcers ischemic related -Colonoscopy performed 1/20 with multiple colonic ulcers seen unable to identify active bleed with poor visualization.  GI recommended either repeating CTA to help with an upper bleed versus colectomy.  CTA was repeated overnight 1/21 with no signs of bleeding seen despite gross active GI bleed.  General surgery updated and is following. Acute blood loss anemia superimposed on normocytic anemia  P: General surgery and GI following, appreciate assistance No plan for surgery at this time Trend CBC Transfuse per protocol Hemoglobin goal greater than 7 N.p.o. except for meds Maintain 2 large-bore IVs Continue IV Protonix  Acute Metabolic / Toxic Encephalopathy  -Remains confused, awakens to voice and tracking but not following commands or speaking. Likely related to sedation in setting of poor renal function. P: Remains on IV Precedex Seroquel 300mg  qHS Daily vilazodone 40mg  As needed IV Haldol Maintain neuroprotective measures Nutrition and bowel regimen Aspiration precautions  Renal Failure, likely ATN   -SP CRRT 1/02- 1/11. Tx'd to [facility] 1/11  -Received another contrast load overnight of 1/21 for repeat CTA, this is likely to result in recurrent and/or permanent renal failure.  P: Nephrology following, appreciate assistance Follow renal function Monitor urine output Avoid nephrotoxins as  able Ensure adequate renal perfusion  UDS Positive: Amphetamines, Cannabis, TCA's  P: Cessation consulting when appropriate   Bipolar Disorder Chronic Pain  P: Continue Vilazodone and Seroquel   Hyperglycemia P: SSI and TF coverage  CBG goal 140-180  HTN -Home medications include Norvasc, aspirin, P: Hold home medications in setting of GI bleed  Continuous telemetry   At Risk Malnutrition  Hypoalonemia  P: Resume TF once cleared by GI and surgery   Hyperkalemia  P: Trend Biochemist, clinical (right click and "Production designer, theatre/television/film" daily)  Diet/type: NPO w/ oral meds  DVT prophylaxis: not indicated GI prophylaxis: PPI Lines: Central line and Dialysis Catheter (PICC 1/10-, R fem HD cath 1/2-)   Foley:  Yes, and it is still needed Code Status:  full code Last date of multidisciplinary goals of care discussion: Discussed with son POA Desire Fulp 01/28/23  CRITICAL CARE Performed by: Freddi Starr  Total critical care time: 45 minutes  Critical care time was exclusive of separately billable procedures and treating other patients.  Critical care was necessary to treat or prevent imminent or life-threatening deterioration.  Critical care was time spent personally by me on the following activities: development of treatment plan with patient and/or surrogate as well as nursing, discussions with consultants, evaluation of patient's response to treatment, examination  of patient, obtaining history from patient or surrogate, ordering and performing treatments and interventions, ordering and review of laboratory studies, ordering and review of radiographic studies, pulse oximetry and re-evaluation of patient's condition.  Freda Jackson, MD [facility] Office: [phone]   See Amion for personal pager PCCM on call pager [phone] until 7pm. Please call Elink 7p-7a. [phone]

## 2022-04-01 NOTE — Progress Notes (Signed)
Nutrition Follow-up  DOCUMENTATION CODES:   Obesity unspecified  INTERVENTION:   - Given persistent GI bleed, significant weight loss this admission, risk of malnutrition, and NPO/clear liquid since 03/29/22, recommend initiation of TPN.  NUTRITION DIAGNOSIS:   Inadequate oral intake related to inability to eat as evidenced by NPO status (on vent).  Ongoing, pt NPO due to GI bleed  GOAL:   Patient will meet greater than or equal to 90% of their needs  Unmet at this time  MONITOR:   Diet advancement, Labs, Weight trends, I & O's  REASON FOR ASSESSMENT:   Consult Enteral/tube feeding initiation and management (trickle TF with recs)  ASSESSMENT:   57 yo female w/ pertinent PMH HTN, T2DM, OSA, urge incontinence, bipolar depression presents to ED on 12/30 w/ rash and sob. Found to have sepsis and acute hypoxemic respiratory failure.  12/30 - admit 01/01 - intubated, trickle TF started 01/03 - CRRT start 01/04 - TF advanced to goal rate 01/08 - CRRT stop 01/10 - CRRT restart 01/11 - extubated, CRRT stop, transferred to [facility] for iHD 01/12 - Cortrak placed (tip gastric) 01/16 - diet advanced to full liquids 01/17 - diet advanced to dysphagia 3 01/18 - TF changed to nocturnal (never started) 01/19 - clear liquids 01/20 - NPO, s/p colonoscopy showing large amount of blood and multiple ulcerations on the R side of the colon (no source identified), s/p EGD with no source of bleeding, Cortrak removed 01/21 - recurrent GI bleed  Pt has not received tube feeds since 03/28/22 per MAR. RD to d/c current nocturnal tube feeding orders. No enteral access at this time. Discussed with CCM MD. Plan to challenge with POs once GI bleed slows.  Per Surgery note today, GI bleeding is secondary to severe gastroenteritis/colitis with ulcerations and "surgery would be last line for this patient."  Pt with a total weight loss of 6.7 kg since 03/10/22. This is an 8.3% weight loss which is severe  and significant for timeframe. Pt at high risk of developing malnutrition. Given persistent GI bleed, significant weight loss this admission, risk of malnutrition, and NPO/clear liquid since 03/29/22, recommend initiation of TPN.  Pt with generalized non-pitting edema.  Admit weight: 80.6 kg Current weight: 73.9 kg  Medications reviewed and include: aranesp weekly, SSI q 4 hours, novolog 3 units q 4 hours, rena-vit, IV protonix, precedex drip, IV abx IVF: LR @ 75 ml/hr  Labs reviewed: BUN 49, creatinine 4.17, phosphorus 9.0, WBC 12.7 CBG's: 63-107 x 24 hours  UOP: 2575 ml x 24 hours I/O's: +816 ml since admit  Diet Order:   Diet Order             Diet NPO time specified  Diet effective now                   EDUCATION NEEDS:   Not appropriate for education at this time  Skin:  Skin Assessment: Skin Integrity Issues: Other: skin tear L knee, MASD groin/abd/inner thighs, MARSI bilateral buttocks  Last BM:  04/01/22 large red type 7  Height:   Ht Readings from Last 1 Encounters:  03/12/22 4\' 10"  (1.473 m)    Weight:   Wt Readings from Last 1 Encounters:  04/01/22 73.9 kg    Ideal Body Weight:  43.2 kg  BMI:  Body mass index is 34.05 kg/m.  Estimated Nutritional Needs:   Kcal:  1700-1900  Protein:  85-100 grams  Fluid:  1L+UOP    Gustavus Bryant, MS,  RD, LDN Inpatient Clinical Dietitian Please see AMiON for contact information.

## 2022-04-01 NOTE — Progress Notes (Signed)
Progress Note  2 Days Post-Op  Subjective: Pt reports abdominal pain, mostly in R side and requests that I do not mash on her abdomen. Still having some bloody BMs. Denies emesis.   Objective: Vital signs in last 24 hours: Temp:  [98.2 F (36.8 C)-99.7 F (37.6 C)] 98.2 F (36.8 C) (01/22 0752) Pulse Rate:  [90-117] 95 (01/22 0700) Resp:  [13-32] 19 (01/22 0700) BP: (80-174)/(53-118) 123/86 (01/22 0700) SpO2:  [93 %-100 %] 96 % (01/22 0840) Weight:  [73.9 kg] 73.9 kg (01/22 0400) Last BM Date : 04/01/22  Intake/Output from previous day: 01/21 0701 - 01/22 0700 In: 2661.4 [I.V.:551; Blood:1944.8; IV Piggyback:165.6] Out: 2575 [Urine:2575] Intake/Output this shift: Total I/O In: 59.1 [I.V.:59.1] Out: 475 [Urine:475]  PE: General: WD, overweight female who is laying in bed and is ill appearing Heart: regular, rate, and rhythm.  Palpable pedal pulses bilaterally Lungs: Respiratory effort nonlabored Abd: soft, generalized ttp without peritonitis, mild distention, bloody stool on bed pad   Lab Results:  Recent Labs    03/31/22 2150 04/01/22 0320  WBC 14.5* 12.7*  HGB 12.4 11.6*  HCT 36.1 34.2*  PLT 305 312   BMET Recent Labs    03/31/22 2150 04/01/22 0320  NA 141 142  K 4.4 4.5  CL 109 111  CO2 17* 18*  GLUCOSE 105* 93  BUN 50* 49*  CREATININE 4.20* 4.17*  CALCIUM 8.5* 8.2*   PT/INR Recent Labs    03/30/22 2215 03/31/22 0620  LABPROT 16.6* 16.6*  INR 1.4* 1.4*   CMP     Component Value Date/Time   NA 142 04/01/2022 0320   NA 137 04/20/2012 1725   K 4.5 04/01/2022 0320   K 4.2 04/20/2012 1725   CL 111 04/01/2022 0320   CL 106 04/20/2012 1725   CO2 18 (L) 04/01/2022 0320   CO2 24 04/20/2012 1725   GLUCOSE 93 04/01/2022 0320   GLUCOSE 97 04/20/2012 1725   BUN 49 (H) 04/01/2022 0320   BUN 14 04/20/2012 1725   CREATININE 4.17 (H) 04/01/2022 0320   CREATININE 0.83 09/30/2019 1057   CALCIUM 8.2 (L) 04/01/2022 0320   CALCIUM 9.2 04/20/2012  1725   PROT 5.6 (L) 03/26/2022 0734   PROT 7.9 04/20/2012 1725   ALBUMIN 1.7 (L) 04/01/2022 0320   ALBUMIN 4.0 04/20/2012 1725   AST 24 03/26/2022 0734   AST 26 04/20/2012 1725   ALT 27 03/26/2022 0734   ALT 39 04/20/2012 1725   ALKPHOS 94 03/26/2022 0734   ALKPHOS 123 04/20/2012 1725   BILITOT 0.4 03/26/2022 0734   BILITOT 0.5 04/20/2012 1725   GFRNONAA 12 (L) 04/01/2022 0320   GFRNONAA 81 09/30/2019 1057   GFRAA 93 09/30/2019 1057   Lipase     Component Value Date/Time   LIPASE 88 (H) 03/09/2022 2204       Studies/Results: CT ANGIO GI BLEED  Result Date: 03/31/2022 CLINICAL DATA:  Lower gastrointestinal hemorrhage EXAM: CTA ABDOMEN AND PELVIS WITHOUT AND WITH CONTRAST TECHNIQUE: Multidetector CT imaging of the abdomen and pelvis was performed using the standard protocol during bolus administration of intravenous contrast. Multiplanar reconstructed images and MIPs were obtained and reviewed to evaluate the vascular anatomy. RADIATION DOSE REDUCTION: This exam was performed according to the departmental dose-optimization program which includes automated exposure control, adjustment of the mA and/or kV according to patient size and/or use of iterative reconstruction technique. CONTRAST:  178mL OMNIPAQUE IOHEXOL 350 MG/ML SOLN COMPARISON:  03/29/2022 FINDINGS: VASCULAR Aorta: Normal caliber  aorta without aneurysm, dissection, vasculitis or significant stenosis. Mild atherosclerotic calcification Celiac: Variant anatomy with separate origin of the left gastric artery directly from the aorta. No evidence of hemodynamically significant stenosis. No aneurysm or dissection. SMA: Patent without evidence of aneurysm, dissection, vasculitis or significant stenosis. Renals: Both renal arteries are patent without evidence of aneurysm, dissection, vasculitis, fibromuscular dysplasia or significant stenosis. IMA: Patent without evidence of aneurysm, dissection, vasculitis or significant stenosis.  Inflow: Patent without evidence of aneurysm, dissection, vasculitis or significant stenosis. Proximal Outflow: Bilateral common femoral and visualized portions of the superficial and profunda femoral arteries are patent without evidence of aneurysm, dissection, vasculitis or significant stenosis. Veins: No obvious venous abnormality within the limitations of this arterial phase study. Review of the MIP images confirms the above findings. NON-VASCULAR Lower chest: Patchy bibasilar ground-glass pulmonary infiltrate, most prevalent within the right middle lobe, is again identified, likely infectious or inflammatory but incompletely evaluated on this examination. No pleural effusion. Cardiac size is mildly enlarged. Hepatobiliary: No focal liver abnormality is seen. Status post cholecystectomy. No biliary dilatation. Pancreas: Unremarkable Spleen: Unremarkable Adrenals/Urinary Tract: The adrenal glands are unremarkable. The kidneys are normal in size and position. Bilateral punctate nonobstructing renal calculi are identified measuring up to 3 mm. No hydronephrosis. No ureteral calculi. No perinephric inflammatory stranding or fluid collections are seen. The bladder is decompressed with a Foley catheter balloon seen within its lumen. Stomach/Bowel: The colon is diffusely fluid-filled, as is the rectal vault. There is superimposed pericolonic inflammatory stranding and hyperemia involving the splenic flexure of the colon and ascending colon in keeping with changes of multifocal infectious or inflammatory colitis. No evidence of obstruction or perforation. No free intraperitoneal gas. Trace free fluid within the pelvis, unchanged, nonspecific. No active extravasation of contrast identified. The stomach and small bowel are unremarkable. Appendix normal. Lymphatic: No pathologic adenopathy within the abdomen and pelvis. Reproductive: Status post hysterectomy. No adnexal masses. Other: No abdominal wall hernia  Musculoskeletal: Osseous structures are age-appropriate. No acute bone abnormality. No lytic or blastic bone lesion. IMPRESSION: 1. No active gastrointestinal hemorrhage identified. 2. Multifocal infectious or inflammatory colitis involving the splenic flexure of the colon and ascending colon. No evidence of obstruction or perforation. 3. Fluid-filled colon and rectal vault in keeping with a diarrheal state. 4. Patchy bibasilar ground-glass pulmonary infiltrate, likely infectious or inflammatory but incompletely evaluated on this examination. 5. Mild bilateral nonobstructing nephrolithiasis. 6. Aortic atherosclerosis. Aortic Atherosclerosis (ICD10-I70.0). Electronically Signed   By: Fidela Salisbury M.D.   On: 03/31/2022 03:17   DG Abd Portable 1V  Result Date: 03/30/2022 CLINICAL DATA:  OG tube placement EXAM: PORTABLE ABDOMEN - 1 VIEW COMPARISON:  CTA GI bleed 03/29/2022 FINDINGS: PICC line tip projects over the superior vena cava. Enteric tube tip projects over the left upper quadrant. Cholecystectomy clips. Gaseous distended loops of bowel throughout the visualized upper abdomen. IMPRESSION: Enteric tube tip projects over the left upper quadrant. Electronically Signed   By: Lovey Newcomer M.D.   On: 03/30/2022 17:11    Anti-infectives: Anti-infectives (From admission, onward)    Start     Dose/Rate Route Frequency Ordered Stop   03/31/22 0600  piperacillin-tazobactam (ZOSYN) IVPB 2.25 g       See Hyperspace for full Linked Orders Report.   2.25 g 100 mL/hr over 30 Minutes Intravenous Every 8 hours 03/30/22 1801     03/30/22 1845  piperacillin-tazobactam (ZOSYN) IVPB 3.375 g       See Hyperspace for full Linked Orders Report.  3.375 g 100 mL/hr over 30 Minutes Intravenous  Once 03/30/22 1801 03/30/22 1850   03/21/22 1000  cefTRIAXone (ROCEPHIN) 2 g in sodium chloride 0.9 % 100 mL IVPB        2 g 200 mL/hr over 30 Minutes Intravenous Every 24 hours 03/21/22 0740 03/28/22 0326   03/20/22 1200   piperacillin-tazobactam (ZOSYN) IVPB 3.375 g  Status:  Discontinued        3.375 g 12.5 mL/hr over 240 Minutes Intravenous Every 8 hours 03/20/22 0921 03/21/22 0740   03/19/22 1200  piperacillin-tazobactam (ZOSYN) IVPB 2.25 g  Status:  Discontinued        2.25 g 100 mL/hr over 30 Minutes Intravenous Every 8 hours 03/19/22 0931 03/20/22 0921   03/18/22 2000  piperacillin-tazobactam (ZOSYN) IVPB 3.375 g  Status:  Discontinued        3.375 g 12.5 mL/hr over 240 Minutes Intravenous Every 8 hours 03/18/22 1355 03/19/22 0931   03/16/22 1015  piperacillin-tazobactam (ZOSYN) IVPB 3.375 g  Status:  Discontinued        3.375 g 100 mL/hr over 30 Minutes Intravenous Every 6 hours 03/16/22 1007 03/18/22 1355   03/16/22 1000  piperacillin-tazobactam (ZOSYN) IVPB 3.375 g  Status:  Discontinued        3.375 g 12.5 mL/hr over 240 Minutes Intravenous Every 8 hours 03/16/22 0949 03/16/22 1007   03/12/22 1800  ampicillin (OMNIPEN) 2 g in sodium chloride 0.9 % 100 mL IVPB  Status:  Discontinued        2 g 300 mL/hr over 20 Minutes Intravenous Every 6 hours 03/12/22 1629 03/16/22 0919   03/12/22 1200  ampicillin (OMNIPEN) 2 g in sodium chloride 0.9 % 100 mL IVPB  Status:  Discontinued        2 g 300 mL/hr over 20 Minutes Intravenous 2 times daily 03/12/22 0945 03/12/22 1629   03/12/22 0600  vancomycin (VANCOREADY) IVPB 500 mg/100 mL  Status:  Discontinued        500 mg 100 mL/hr over 60 Minutes Intravenous Every 48 hours 03/11/22 2256 03/11/22 2307   03/11/22 2344  vancomycin variable dose per unstable renal function (pharmacist dosing)  Status:  Discontinued         Does not apply See admin instructions 03/11/22 2344 03/12/22 0945   03/11/22 2315  vancomycin (VANCOREADY) IVPB 1750 mg/350 mL  Status:  Discontinued        1,750 mg 175 mL/hr over 120 Minutes Intravenous  Once 03/11/22 2220 03/11/22 2250   03/10/22 1200  cefTRIAXone (ROCEPHIN) 2 g in sodium chloride 0.9 % 100 mL IVPB  Status:  Discontinued         2 g 200 mL/hr over 30 Minutes Intravenous Every 24 hours 03/10/22 0934 03/12/22 0945   03/10/22 0645  vancomycin (VANCOREADY) IVPB 1500 mg/300 mL        1,500 mg 150 mL/hr over 120 Minutes Intravenous  Once 03/10/22 0547 03/10/22 0848   03/10/22 0555  vancomycin variable dose per unstable renal function (pharmacist dosing)  Status:  Discontinued         Does not apply See admin instructions 03/10/22 0555 03/10/22 0931   03/10/22 0400  ceFEPIme (MAXIPIME) 2 g in sodium chloride 0.9 % 100 mL IVPB  Status:  Discontinued        2 g 200 mL/hr over 30 Minutes Intravenous Every 24 hours 03/10/22 0303 03/10/22 0931   03/10/22 0345  metroNIDAZOLE (FLAGYL) IVPB 500 mg  Status:  Discontinued  500 mg 100 mL/hr over 60 Minutes Intravenous Every 12 hours 03/10/22 0246 03/10/22 0931        Assessment/Plan GI bleeding secondary to severe gastroenteritis/colitis with ulcerations - s/p colonoscopy, bx pending - s/p transfusion 6 units from 1/20-1/21, hgb 11.6 from 12.4 this AM - CTA without localized bleeding  - surgery would be last line for this patient without localized source of bleeding, TAC with risk of continued bleeding if ulcerations in small bowel continue to bleed. TAC would also result in ileostomy with high risk of dehydration from this. Recommend continued supportive care for now and no surgery unless no other options for medical management. We will follow   FEN: NPO, IVF  VTE: SCDs, no chemical prophylaxis in setting of GI bleed ID: [email or web address]   LOS: 22 days   I reviewed Consultant GI notes, hospitalist notes, last 24 h vitals and pain scores, last 48 h intake and output, last 24 h labs and trends, and last 24 h imaging results.     Norm Parcel, [facility] Surgery 04/01/2022, 11:08 AM Please see Amion for pager number during day hours 7:00am-4:30pm

## 2022-04-01 NOTE — Progress Notes (Signed)
Patient ID: Darlene Barber, female   DOB: Feb 01, 1966, 57 y.o.   MRN: [id number] S: Patient seen and examined bedside in ICU. Boyfriend at bedside. Patient keeps reporting that she is hot and wants the air on or window open. She also endorses thirst but is NPO. Still with ongoing GIB. Uop ~2.5L O:BP 123/86   Pulse 95   Temp 98.2 F (36.8 C) (Oral)   Resp 19   Ht 4\' 10"  (1.473 m)   Wt 73.9 kg   SpO2 96%   BMI 34.05 kg/m   Intake/Output Summary (Last 24 hours) at 04/01/2022 1024 Last data filed at 04/01/2022 0800 Gross per 24 hour  Intake 578.33 ml  Output 2850 ml  Net -2271.67 ml   Intake/Output: I/O last 3 completed shifts: In: 3419 [I.V.:1189.2; Blood:3165.8; IV Piggyback:200.1] Out: 3790 [WIOXB:3532]  Intake/Output this shift:  Total I/O In: 0.3 [I.V.:0.3] Out: 275 [Urine:275] Weight change: -0.9 kg Gen: NAD CVS: tachycardic Resp: CTA, normal WOB Abd: +BS, soft, NT/ND Ext: no edema Neuro: awake, alert  Recent Labs  Lab 03/26/22 0438 03/26/22 0734 03/27/22 0311 03/28/22 0530 03/29/22 0255 03/29/22 2207 03/30/22 0348 03/31/22 0620 03/31/22 2150 04/01/22 0320  NA 140  --  140 136 136 134* 137 134* 141 142  K 4.3  --  4.3 4.2 4.6 4.8 5.3* 4.7 4.4 4.5  CL 106  --  105 104 107 109 111 109 109 111  CO2 22  --  23 20* 17* 14* 14* 11* 17* 18*  GLUCOSE 159*  --  123* 116* 157* 120* 113* 118* 105* 93  BUN 62*  --  73* 74* 72* 63* 63* 53* 50* 49*  CREATININE 3.58*  --  4.15* 4.17* 4.19* 4.00* 4.24* 3.93* 4.20* 4.17*  ALBUMIN 1.9* 2.0* 2.0* 1.9* 1.9*  --  <1.5* 1.8*  --  1.7*  CALCIUM 8.3*  --  9.0 8.7* 8.9 8.2* 8.5* 8.3* 8.5* 8.2*  PHOS 6.3*  --  6.8* 5.9* 6.8*  --  8.0* 7.1*  --  9.0*  AST  --  24  --   --   --   --   --   --   --   --   ALT  --  27  --   --   --   --   --   --   --   --    Liver Function Tests: Recent Labs  Lab 03/26/22 0734 03/27/22 0311 03/30/22 0348 03/31/22 0620 04/01/22 0320  AST 24  --   --   --   --   ALT 27  --   --   --   --    ALKPHOS 94  --   --   --   --   BILITOT 0.4  --   --   --   --   PROT 5.6*  --   --   --   --   ALBUMIN 2.0*   < > <1.5* 1.8* 1.7*   < > = values in this interval not displayed.   No results for input(s): "LIPASE", "AMYLASE" in the last 168 hours. No results for input(s): "AMMONIA" in the last 168 hours. CBC: Recent Labs  Lab 03/29/22 2207 03/30/22 0349 03/30/22 1313 03/30/22 2215 03/31/22 0102 03/31/22 0620 03/31/22 2150 04/01/22 0320  WBC 13.9*   < > 14.5* 10.6*  --  14.9* 14.5* 12.7*  NEUTROABS 11.3*  --   --   --   --  12.4*  --   --  HGB 6.8*   < > 7.4* 6.7*   < > 10.4* 12.4 11.6*  HCT 19.7*   < > 21.6* 19.8*   < > 29.9* 36.1 34.2*  MCV 87.9   < > 87.8 89.6  --  88.2 87.2 88.4  PLT 224   < > 238 244  --  249 305 312   < > = values in this interval not displayed.   Cardiac Enzymes: No results for input(s): "CKTOTAL", "CKMB", "CKMBINDEX", "TROPONINI" in the last 168 hours. CBG: Recent Labs  Lab 03/31/22 1519 03/31/22 1927 03/31/22 2307 04/01/22 0316 04/01/22 0749  GLUCAP 80 91 89 104* 107*    Iron Studies: No results for input(s): "IRON", "TIBC", "TRANSFERRIN", "FERRITIN" in the last 72 hours. Studies/Results: CT ANGIO GI BLEED  Result Date: 03/31/2022 CLINICAL DATA:  Lower gastrointestinal hemorrhage EXAM: CTA ABDOMEN AND PELVIS WITHOUT AND WITH CONTRAST TECHNIQUE: Multidetector CT imaging of the abdomen and pelvis was performed using the standard protocol during bolus administration of intravenous contrast. Multiplanar reconstructed images and MIPs were obtained and reviewed to evaluate the vascular anatomy. RADIATION DOSE REDUCTION: This exam was performed according to the departmental dose-optimization program which includes automated exposure control, adjustment of the mA and/or kV according to patient size and/or use of iterative reconstruction technique. CONTRAST:  11mL OMNIPAQUE IOHEXOL 350 MG/ML SOLN COMPARISON:  03/29/2022 FINDINGS: VASCULAR Aorta: Normal  caliber aorta without aneurysm, dissection, vasculitis or significant stenosis. Mild atherosclerotic calcification Celiac: Variant anatomy with separate origin of the left gastric artery directly from the aorta. No evidence of hemodynamically significant stenosis. No aneurysm or dissection. SMA: Patent without evidence of aneurysm, dissection, vasculitis or significant stenosis. Renals: Both renal arteries are patent without evidence of aneurysm, dissection, vasculitis, fibromuscular dysplasia or significant stenosis. IMA: Patent without evidence of aneurysm, dissection, vasculitis or significant stenosis. Inflow: Patent without evidence of aneurysm, dissection, vasculitis or significant stenosis. Proximal Outflow: Bilateral common femoral and visualized portions of the superficial and profunda femoral arteries are patent without evidence of aneurysm, dissection, vasculitis or significant stenosis. Veins: No obvious venous abnormality within the limitations of this arterial phase study. Review of the MIP images confirms the above findings. NON-VASCULAR Lower chest: Patchy bibasilar ground-glass pulmonary infiltrate, most prevalent within the right middle lobe, is again identified, likely infectious or inflammatory but incompletely evaluated on this examination. No pleural effusion. Cardiac size is mildly enlarged. Hepatobiliary: No focal liver abnormality is seen. Status post cholecystectomy. No biliary dilatation. Pancreas: Unremarkable Spleen: Unremarkable Adrenals/Urinary Tract: The adrenal glands are unremarkable. The kidneys are normal in size and position. Bilateral punctate nonobstructing renal calculi are identified measuring up to 3 mm. No hydronephrosis. No ureteral calculi. No perinephric inflammatory stranding or fluid collections are seen. The bladder is decompressed with a Foley catheter balloon seen within its lumen. Stomach/Bowel: The colon is diffusely fluid-filled, as is the rectal vault. There is  superimposed pericolonic inflammatory stranding and hyperemia involving the splenic flexure of the colon and ascending colon in keeping with changes of multifocal infectious or inflammatory colitis. No evidence of obstruction or perforation. No free intraperitoneal gas. Trace free fluid within the pelvis, unchanged, nonspecific. No active extravasation of contrast identified. The stomach and small bowel are unremarkable. Appendix normal. Lymphatic: No pathologic adenopathy within the abdomen and pelvis. Reproductive: Status post hysterectomy. No adnexal masses. Other: No abdominal wall hernia Musculoskeletal: Osseous structures are age-appropriate. No acute bone abnormality. No lytic or blastic bone lesion. IMPRESSION: 1. No active gastrointestinal hemorrhage identified. 2. Multifocal infectious or  inflammatory colitis involving the splenic flexure of the colon and ascending colon. No evidence of obstruction or perforation. 3. Fluid-filled colon and rectal vault in keeping with a diarrheal state. 4. Patchy bibasilar ground-glass pulmonary infiltrate, likely infectious or inflammatory but incompletely evaluated on this examination. 5. Mild bilateral nonobstructing nephrolithiasis. 6. Aortic atherosclerosis. Aortic Atherosclerosis (ICD10-I70.0). Electronically Signed   By: Fidela Salisbury M.D.   On: 03/31/2022 03:17   DG Abd Portable 1V  Result Date: 03/30/2022 CLINICAL DATA:  OG tube placement EXAM: PORTABLE ABDOMEN - 1 VIEW COMPARISON:  CTA GI bleed 03/29/2022 FINDINGS: PICC line tip projects over the superior vena cava. Enteric tube tip projects over the left upper quadrant. Cholecystectomy clips. Gaseous distended loops of bowel throughout the visualized upper abdomen. IMPRESSION: Enteric tube tip projects over the left upper quadrant. Electronically Signed   By: Lovey Newcomer M.D.   On: 03/30/2022 17:11    sodium chloride   Intravenous Once   arformoterol  15 mcg Nebulization BID   budesonide (PULMICORT)  nebulizer solution  0.5 mg Nebulization BID   Chlorhexidine Gluconate Cloth  6 each Topical Daily   darbepoetin (ARANESP) injection - DIALYSIS  40 mcg Subcutaneous Q Fri-1800   insulin aspart  0-15 Units Subcutaneous Q4H   insulin aspart  3 Units Subcutaneous Q4H   multivitamin  1 tablet Oral QHS   mouth rinse  15 mL Mouth Rinse 4 times per day   pantoprazole (PROTONIX) IV  40 mg Intravenous Q12H   QUEtiapine  50 mg Oral BID   revefenacin  175 mcg Nebulization Daily   sodium chloride flush  10-40 mL Intracatheter Q12H   Vilazodone HCl  40 mg Oral Daily    BMET    Component Value Date/Time   NA 142 04/01/2022 0320   NA 137 04/20/2012 1725   K 4.5 04/01/2022 0320   K 4.2 04/20/2012 1725   CL 111 04/01/2022 0320   CL 106 04/20/2012 1725   CO2 18 (L) 04/01/2022 0320   CO2 24 04/20/2012 1725   GLUCOSE 93 04/01/2022 0320   GLUCOSE 97 04/20/2012 1725   BUN 49 (H) 04/01/2022 0320   BUN 14 04/20/2012 1725   CREATININE 4.17 (H) 04/01/2022 0320   CREATININE 0.83 09/30/2019 1057   CALCIUM 8.2 (L) 04/01/2022 0320   CALCIUM 9.2 04/20/2012 1725   GFRNONAA 12 (L) 04/01/2022 0320   GFRNONAA 81 09/30/2019 1057   GFRAA 93 09/30/2019 1057   CBC    Component Value Date/Time   WBC 12.7 (H) 04/01/2022 0320   RBC 3.87 04/01/2022 0320   HGB 11.6 (L) 04/01/2022 0320   HGB 16.7 (H) 04/20/2012 1725   HCT 34.2 (L) 04/01/2022 0320   HCT 50.0 (H) 04/20/2012 1725   PLT 312 04/01/2022 0320   PLT 331 04/20/2012 1725   MCV 88.4 04/01/2022 0320   MCV 98 04/20/2012 1725   MCH 30.0 04/01/2022 0320   MCHC 33.9 04/01/2022 0320   RDW 17.4 (H) 04/01/2022 0320   RDW 14.2 04/20/2012 1725   LYMPHSABS 1.3 03/31/2022 0620   LYMPHSABS 2.7 08/27/2011 0558   MONOABS 0.3 03/31/2022 0620   MONOABS 0.5 08/27/2011 0558   EOSABS 0.4 03/31/2022 0620   EOSABS 0.5 08/27/2011 0558   BASOSABS 0.0 03/31/2022 0620   BASOSABS 0.1 08/27/2011 0558    Assessment/ Plan: AKI - crt of 0.8 in August of 2023, bland UA in  October of 23.  Now with AKI , proteinuria and hematuria but also pyuria.  Seems  most likely to be AKI from ATN in the setting of bacteremia. Given the proteinuria and hematuria concerning for GN, some serologies were sent and were generally negative (see above). 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 SP CRRT 1/02- 1/11. Tx'd to [facility] 1/11. Her last HD session was 03/25/22 and right femoral HD catheter removed 03/26/22.  Despite IV contrast and ABLA, her UOP has markedly improved and her Scr was down to 3.93 today.  Unfortunately she had another CT angio 1/21 so will need to continue to follow UOP and daily labs.  Will likely require colectomy which could further complicate her renal recovery. Cr relatively stable today at 4.17 Remains nonoliguric. Will start gentle IVF given NPO status: LR 75cc/hr x 1 day No indication to resume IHD at this time Will follow response and decide if she will need a tunneled catheter and ongoing HD.   ABLA with evidence of acute GI bleed - pt with hematochezia last night and this morning.  Receiving blood transfusion.  Unfortunately colonoscopy and CT angio x 2 could not isolate location of GIB, now pointing to diffuse colonic bleeding due to diffuse ulcerations and colitis.  Surgery consulted for possible total colectomy however very high risk Hyperkalemia - lokelma x 2 and switching TF's to Nepro which has resolved issue. K WNL today Volume - persistent infiltrates on CXR are likely infection-related.  HTN - BP's have improved.  Back on scheduled meds.  Normocytic Anemia  - deferring IV iron in setting of infection.  Aranesp 40 mcg every Thursday.  Hgb dropped to 6.4 again this morning, will need blood transfusion.  Gi following for ABLA.  Strep bacteremia-  streptococcus infantarius, TEE negative, getting Rocephin thru 1/17. Colonoscopy when more stable per pmd.  Acute hypoxic respiratory failure - extubated on 1/10, on  high level [place] O2 now. Repeat CXR's show persistent bilat infiltrates as above H/o polysubstance abuse  Gean Quint, MD [facility]

## 2022-04-01 NOTE — Progress Notes (Signed)
GASTROENTEROLOGY ROUNDING NOTE   Subjective: 2 small volume hematochezia. Hgb is stable   Objective: Vital signs in last 24 hours: Temp:  [98.1 F (36.7 C)-99.7 F (37.6 C)] 98.7 F (37.1 C) (01/22 1546) Pulse Rate:  [91-178] 110 (01/22 1400) Resp:  [13-29] 17 (01/22 1400) BP: (80-168)/(53-118) 141/98 (01/22 1400) SpO2:  [79 %-100 %] 97 % (01/22 1400) Weight:  [73.9 kg] 73.9 kg (01/22 0400) Last BM Date : 04/01/22 General: NAD Declined exam    Intake/Output from previous day: 01/21 0701 - 01/22 0700 In: 2661.4 [I.V.:551; Blood:1944.8; IV Piggyback:165.6] Out: 2575 [Urine:2575] Intake/Output this shift: Total I/O In: 561.1 [I.V.:464.1; IV Piggyback:97] Out: 900 [Urine:900]   Lab Results: Recent Labs    03/31/22 2150 04/01/22 0320 04/01/22 1052  WBC 14.5* 12.7* 15.2*  HGB 12.4 11.6* 13.1  PLT 305 312 382  MCV 87.2 88.4 88.7   BMET Recent Labs    03/31/22 0620 03/31/22 2150 04/01/22 0320  NA 134* 141 142  K 4.7 4.4 4.5  CL 109 109 111  CO2 11* 17* 18*  GLUCOSE 118* 105* 93  BUN 53* 50* 49*  CREATININE 3.93* 4.20* 4.17*  CALCIUM 8.3* 8.5* 8.2*   LFT Recent Labs    03/30/22 0348 03/31/22 0620 04/01/22 0320  ALBUMIN <1.5* 1.8* 1.7*   PT/INR Recent Labs    03/30/22 2215 03/31/22 0620  INR 1.4* 1.4*      Imaging/Other results: CT ANGIO GI BLEED  Result Date: 03/31/2022 CLINICAL DATA:  Lower gastrointestinal hemorrhage EXAM: CTA ABDOMEN AND PELVIS WITHOUT AND WITH CONTRAST TECHNIQUE: Multidetector CT imaging of the abdomen and pelvis was performed using the standard protocol during bolus administration of intravenous contrast. Multiplanar reconstructed images and MIPs were obtained and reviewed to evaluate the vascular anatomy. RADIATION DOSE REDUCTION: This exam was performed according to the departmental dose-optimization program which includes automated exposure control, adjustment of the mA and/or kV according to patient size and/or  use of iterative reconstruction technique. CONTRAST:  111mL OMNIPAQUE IOHEXOL 350 MG/ML SOLN COMPARISON:  03/29/2022 FINDINGS: VASCULAR Aorta: Normal caliber aorta without aneurysm, dissection, vasculitis or significant stenosis. Mild atherosclerotic calcification Celiac: Variant anatomy with separate origin of the left gastric artery directly from the aorta. No evidence of hemodynamically significant stenosis. No aneurysm or dissection. SMA: Patent without evidence of aneurysm, dissection, vasculitis or significant stenosis. Renals: Both renal arteries are patent without evidence of aneurysm, dissection, vasculitis, fibromuscular dysplasia or significant stenosis. IMA: Patent without evidence of aneurysm, dissection, vasculitis or significant stenosis. Inflow: Patent without evidence of aneurysm, dissection, vasculitis or significant stenosis. Proximal Outflow: Bilateral common femoral and visualized portions of the superficial and profunda femoral arteries are patent without evidence of aneurysm, dissection, vasculitis or significant stenosis. Veins: No obvious venous abnormality within the limitations of this arterial phase study. Review of the MIP images confirms the above findings. NON-VASCULAR Lower chest: Patchy bibasilar ground-glass pulmonary infiltrate, most prevalent within the right middle lobe, is again identified, likely infectious or inflammatory but incompletely evaluated on this examination. No pleural effusion. Cardiac size is mildly enlarged. Hepatobiliary: No focal liver abnormality is seen. Status post cholecystectomy. No biliary dilatation. Pancreas: Unremarkable Spleen: Unremarkable Adrenals/Urinary Tract: The adrenal glands are unremarkable. The kidneys are normal in size and position. Bilateral punctate nonobstructing renal calculi are identified measuring up to 3 mm. No hydronephrosis. No ureteral calculi. No perinephric inflammatory stranding or fluid collections are seen. The bladder is  decompressed with a Foley catheter balloon seen within its lumen. Stomach/Bowel: The  colon is diffusely fluid-filled, as is the rectal vault. There is superimposed pericolonic inflammatory stranding and hyperemia involving the splenic flexure of the colon and ascending colon in keeping with changes of multifocal infectious or inflammatory colitis. No evidence of obstruction or perforation. No free intraperitoneal gas. Trace free fluid within the pelvis, unchanged, nonspecific. No active extravasation of contrast identified. The stomach and small bowel are unremarkable. Appendix normal. Lymphatic: No pathologic adenopathy within the abdomen and pelvis. Reproductive: Status post hysterectomy. No adnexal masses. Other: No abdominal wall hernia Musculoskeletal: Osseous structures are age-appropriate. No acute bone abnormality. No lytic or blastic bone lesion. IMPRESSION: 1. No active gastrointestinal hemorrhage identified. 2. Multifocal infectious or inflammatory colitis involving the splenic flexure of the colon and ascending colon. No evidence of obstruction or perforation. 3. Fluid-filled colon and rectal vault in keeping with a diarrheal state. 4. Patchy bibasilar ground-glass pulmonary infiltrate, likely infectious or inflammatory but incompletely evaluated on this examination. 5. Mild bilateral nonobstructing nephrolithiasis. 6. Aortic atherosclerosis. Aortic Atherosclerosis (ICD10-I70.0). Electronically Signed   By: Fidela Salisbury M.D.   On: 03/31/2022 03:17   DG Abd Portable 1V  Result Date: 03/30/2022 CLINICAL DATA:  OG tube placement EXAM: PORTABLE ABDOMEN - 1 VIEW COMPARISON:  CTA GI bleed 03/29/2022 FINDINGS: PICC line tip projects over the superior vena cava. Enteric tube tip projects over the left upper quadrant. Cholecystectomy clips. Gaseous distended loops of bowel throughout the visualized upper abdomen. IMPRESSION: Enteric tube tip projects over the left upper quadrant. Electronically Signed   By:  Lovey Newcomer M.D.   On: 03/30/2022 17:11      Assessment &Plan  12 yr F with poly substance abuse, acute lower GI hemorrhage secondary to diffuse colonic ulceration, she has had a prolonged hospitalization for AKI on HD, sepsis and and respiratory failure which have since improved  Await pathology report Not a surgical candidate Continue supportive care Monitor Hgb and transfuse as needed  Clear liquids and slowly advance diet if no further bleeding    K. Denzil Magnuson , MD [phone]  [facility]

## 2022-04-02 DIAGNOSIS — R6521 Severe sepsis with septic shock: Secondary | ICD-10-CM | POA: Diagnosis not present

## 2022-04-02 DIAGNOSIS — A419 Sepsis, unspecified organism: Secondary | ICD-10-CM | POA: Diagnosis not present

## 2022-04-02 LAB — GLUCOSE, CAPILLARY
Glucose-Capillary: 91 mg/dL (ref 70–99)
Glucose-Capillary: 104 mg/dL — ABNORMAL HIGH (ref 70–99)
Glucose-Capillary: 120 mg/dL — ABNORMAL HIGH (ref 70–99)
Glucose-Capillary: 106 mg/dL — ABNORMAL HIGH (ref 70–99)
Glucose-Capillary: 102 mg/dL — ABNORMAL HIGH (ref 70–99)
Glucose-Capillary: 94 mg/dL (ref 70–99)

## 2022-04-02 LAB — CBC
HCT: 34.9 % — ABNORMAL LOW (ref 36.0–46.0)
Hemoglobin: 11.5 g/dL — ABNORMAL LOW (ref 12.0–15.0)
MCH: 29.5 pg (ref 26.0–34.0)
MCHC: 33 g/dL (ref 30.0–36.0)
MCV: 89.5 fL (ref 80.0–100.0)
Platelets: 379 10*3/uL (ref 150–400)
RBC: 3.9 MIL/uL (ref 3.87–5.11)
RDW: 17.3 % — ABNORMAL HIGH (ref 11.5–15.5)
WBC: 14 10*3/uL — ABNORMAL HIGH (ref 4.0–10.5)
nRBC: 1.8 % — ABNORMAL HIGH (ref 0.0–0.2)

## 2022-04-02 LAB — RENAL FUNCTION PANEL
Albumin: 1.8 g/dL — ABNORMAL LOW (ref 3.5–5.0)
Anion gap: 15 (ref 5–15)
BUN: 45 mg/dL — ABNORMAL HIGH (ref 6–20)
CO2: 16 mmol/L — ABNORMAL LOW (ref 22–32)
Calcium: 8.5 mg/dL — ABNORMAL LOW (ref 8.9–10.3)
Chloride: 106 mmol/L (ref 98–111)
Creatinine, Ser: 4.24 mg/dL — ABNORMAL HIGH (ref 0.44–1.00)
GFR, Estimated: 12 mL/min — ABNORMAL LOW (ref >60)
Glucose, Bld: 90 mg/dL (ref 70–99)
Phosphorus: 6.8 mg/dL — ABNORMAL HIGH (ref 2.5–4.6)
Potassium: 4.4 mmol/L (ref 3.5–5.1)
Sodium: 137 mmol/L (ref 135–145)

## 2022-04-02 LAB — CMV DNA, QUANTITATIVE, PCR
CMV DNA Quant: NEGATIVE IU/mL
Log10 CMV Qn DNA Pl: UNDETERMINED log10 IU/mL

## 2022-04-02 MED ORDER — BOOST / RESOURCE BREEZE PO LIQD CUSTOM
1.0000 | Freq: Three times a day (TID) | ORAL | Status: DC
  Administered 2022-04-02 – 2022-04-05 (×6): 1 via ORAL

## 2022-04-02 MED ORDER — PANTOPRAZOLE SODIUM 40 MG IV SOLR
40.0000 mg | INTRAVENOUS | Status: DC
  Administered 2022-04-03 – 2022-04-12 (×10): 40 mg via INTRAVENOUS
  Filled 2022-04-02 (×10): qty 10

## 2022-04-02 MED ORDER — AMLODIPINE BESYLATE 10 MG PO TABS
10.0000 mg | ORAL_TABLET | Freq: Every day | ORAL | Status: DC
  Administered 2022-04-02 – 2022-04-23 (×22): 10 mg via ORAL
  Filled 2022-04-02 (×22): qty 1

## 2022-04-02 MED ORDER — PROMETHAZINE HCL 12.5 MG PO TABS
12.5000 mg | ORAL_TABLET | Freq: Four times a day (QID) | ORAL | Status: DC | PRN
  Administered 2022-04-02 – 2022-04-23 (×23): 12.5 mg via ORAL
  Filled 2022-04-02 (×35): qty 1

## 2022-04-02 MED ORDER — LACTATED RINGERS IV SOLN: INTRAVENOUS | Status: AC

## 2022-04-02 NOTE — Progress Notes (Addendum)
Daily Rounding Note  04/02/2022, 12:38 PM  LOS: 23 days   SUBJECTIVE:   Chief complaint:     Bloody BM's persist, volume is smaller.  However had large incontinent brown liquid stool in last 30 m.  Continues to complain of nausea and RD has recommended initiation TPN.  Nausea is chronic has been present for years.  A doctor in the past prescribed promethazine.  Continues to have abdominal tenderness.  But not severe pain.  OBJECTIVE:         Vital signs in last 24 hours:    Temp:  [97.6 F (36.4 C)-99.1 F (37.3 C)] 97.6 F (36.4 C) (01/23 1136) Pulse Rate:  [100-127] 117 (01/23 1100) Resp:  [13-25] 17 (01/23 1100) BP: (86-177)/(60-117) 168/117 (01/23 1000) SpO2:  [93 %-100 %] 98 % (01/23 1100) Weight:  [74.9 kg] 74.9 kg (01/23 0500) Last BM Date : 04/02/22 Filed Weights   03/31/22 0452 04/01/22 0400 04/02/22 0500  Weight: 74.8 kg 73.9 kg 74.9 kg   General: Chronically, acutely ill.  Obese.  Anxious. Heart: RRR. Chest: No labored breathing or cough.  Vocal quality hoarse/raspy. Abdomen: Incontinent of liquid brown stool.  No melena, no visible blood.  Obese.  Active bowel sounds.  Tender throughout without guarding or rebound.  Skin in perirectal area and thighs erythematous Neuro/Psych: Anxious, cooperative, oriented x 3.  Intake/Output from previous day: 01/22 0701 - 01/23 0700 In: 2881.9 [P.O.:690; I.V.:1945; IV Piggyback:247] Out: 1700 [Urine:1700]  Intake/Output this shift: Total I/O In: 340.2 [I.V.:340.2] Out: -   Lab Results: Recent Labs    04/01/22 1052 04/01/22 1836 04/02/22 0309  WBC 15.2* 14.2* 14.0*  HGB 13.1 11.4* 11.5*  HCT 39.3 33.0* 34.9*  PLT 382 360 379   BMET Recent Labs    03/31/22 2150 04/01/22 0320 04/02/22 0309  NA 141 142 137  K 4.4 4.5 4.4  CL 109 111 106  CO2 17* 18* 16*  GLUCOSE 105* 93 90  BUN 50* 49* 45*  CREATININE 4.20* 4.17* 4.24*  CALCIUM 8.5* 8.2* 8.5*    LFT Recent Labs    03/31/22 0620 04/01/22 0320 04/02/22 0309  ALBUMIN 1.8* 1.7* 1.8*   PT/INR Recent Labs    03/30/22 2215 03/31/22 0620  LABPROT 16.6* 16.6*  INR 1.4* 1.4*   Hepatitis Panel No results for input(s): "HEPBSAG", "HCVAB", "HEPAIGM", "HEPBIGM" in the last 72 hours.  Studies/Results: No results found.  Scheduled Meds:  sodium chloride   Intravenous Once   amLODipine  10 mg Oral Daily   arformoterol  15 mcg Nebulization BID   budesonide (PULMICORT) nebulizer solution  0.5 mg Nebulization BID   Chlorhexidine Gluconate Cloth  6 each Topical Daily   darbepoetin (ARANESP) injection - DIALYSIS  40 mcg Subcutaneous Q Fri-1800   insulin aspart  0-15 Units Subcutaneous Q4H   multivitamin  1 tablet Oral QHS   nortriptyline  50 mg Oral QHS   mouth rinse  15 mL Mouth Rinse 4 times per day   pantoprazole (PROTONIX) IV  40 mg Intravenous Q12H   QUEtiapine  300 mg Oral QHS   revefenacin  175 mcg Nebulization Daily   sodium chloride flush  10-40 mL Intracatheter Q12H   Vilazodone HCl  40 mg Oral Daily   Continuous Infusions:  sodium chloride 10 mL/hr at 04/02/22 1100   sodium chloride 10 mL (04/01/22 1039)   dextrose 5 % and 0.45% NaCl Stopped (03/31/22 0730)   lactated ringers  piperacillin-tazobactam (ZOSYN)  IV Stopped (04/02/22 0535)   PRN Meds:.sodium chloride, acetaminophen, albuterol, docusate, haloperidol lactate, labetalol, lip balm, ondansetron (ZOFRAN) IV, mouth rinse, polyethylene glycol, promethazine, sodium chloride flush   ASSESMENT:    Hematochezia.  Colitis w ulceration.  Colonoscopy 1/20:  blood throughout colon, multiple ulcer.  Path not yet read.  Surgery following and no plans for surgery, very high risk.  Day 3 Zosyn, previous 7 d Rocephin. 1/20 EGD: Normal.  C. difficile and stool PCR path panel were negative on 12/30, 3 weeks ago.    AKI.  Required CRRT, then  HD, last session 1/15    Resp failure, sepsis.  Strep bacteremia.   Persistent bil infiltrates.      Blood loss anemia. 6 PRBCs thus far.      Polysubstance abuse.      Malnutrition risk.     PLAN     Decrease PPI to 1 x daily given benign EGD.    Nutrition management per RD and CCM.  Offer po as tolerated.  Currently on clears.      Azucena Freed  04/02/2022, 12:38 PM Phone [phone]   Attending physician's note   I have taken a history, reviewed the chart and examined the patient. I performed a substantive portion of this encounter, including complete performance of at least one of the key components, in conjunction with the APP. I agree with the APP's note, impression and recommendations.    Severe colitis with ulceration, pathology still pending Slowly advance diet as tolerated  Hemoglobin remains stable, last transfusion on 03/31/2021 Continue supportive care    K. Denzil Magnuson , MD [phone]

## 2022-04-02 NOTE — Progress Notes (Signed)
Pharmacy Antibiotic Note  Darlene Barber is a 57 y.o. female admitted on 03/10/2022 with  intra-abd infection .  Pharmacy has been consulted for zosyn dosing.  Pt recently completed a course of abx for Streptococcus infantarius bacteremia with ceftriaxone on 1/17. She continued to have a low-grade fever on 1/21. Zosyn was ordered for empiric coverage for intra-abd infection and a stop date has been placed for 1/26, per MD. She continues to have an AKI--Scr 4.24 today. She has been afebrile since 1/21 afternoon.   Plan: Continue Zosyn 2.25g IV q8h until 1/26 Monitor renal function and overall clinical picture   Height: 4\' 10"  (147.3 cm) Weight: 74.9 kg (165 lb 2 oz) IBW/kg (Calculated) : 40.9  Temp (24hrs), Avg:98.4 F (36.9 C), Min:97.7 F (36.5 C), Max:99.1 F (37.3 C)  Recent Labs  Lab 03/30/22 0348 03/30/22 0349 03/31/22 0620 03/31/22 2150 04/01/22 0320 04/01/22 1052 04/01/22 1836 04/02/22 0309  WBC  --    < > 14.9* 14.5* 12.7* 15.2* 14.2* 14.0*  CREATININE 4.24*  --  3.93* 4.20* 4.17*  --   --  4.24*   < > = values in this interval not displayed.     Estimated Creatinine Clearance: 12.7 mL/min (A) (by C-G formula based on SCr of 4.24 mg/dL (H)).    Allergies  Allergen Reactions   Piper Other (See Comments)    Black pepper Feels like throat is closing, itchy Feels like throat is closing, itchy Feels like throat is closing, itchy   Tape Rash    Paper tape is ok to use.    Antimicrobials this admission: 1/20 zosyn>>(1/26)  Microbiology results: 12/30 blood>>Streptococcus infantarius  1/3 blood>>negF 1/17 blood>>neg  Billey Gosling, PharmD PGY1 Pharmacy Resident 1/23/202410:24 AM

## 2022-04-02 NOTE — Progress Notes (Signed)
SLP Cancellation Note  Patient Details Name: Darlene Barber MRN: [id number] DOB: 29-Jul-1965   Cancelled treatment:       Reason Eval/Treat Not Completed: Patient declined, no reason specified;Other (comment) (patient complaining of nausea, abdominal discomfort but she and spouse report she is tolerating liquids well. SLP will f/u next date.)   Sonia Baller, MA, CCC-SLP Speech Therapy

## 2022-04-02 NOTE — Progress Notes (Signed)
Brief Nutrition Note  Discussed pt with RN and during ICU rounds. Pt last seen by this RD yesterday, 04/01/22. At that time, pt was NPO and RD recommended initiation of TPN. Diet advanced to clear liquids yesterday afternoon by GI. Per notes, pt experiencing nausea.  RD to order clear liquid oral nutrition supplement: - Boost Breeze po TID, each supplement provides 250 kcal and 9 grams of protein  Continue to recommend TPN at this time as pt is unable to meet her nutritional needs via PO intake alone and continues to have GI bleed.   Gustavus Bryant, MS, RD, LDN Inpatient Clinical Dietitian Please see AMiON for contact information.

## 2022-04-02 NOTE — Progress Notes (Signed)
Patient ID: Darlene Barber, female   DOB: 12-22-1965, 57 y.o.   MRN: [id number] S: Patient seen and examined bedside in ICU. Boyfriend at bedside. More calm today. She reports nausea today and she reports this has a chronic condition (she reports that chronically took promethazine). UOP 1.7L at least O:BP (!) 177/117 (BP Location: Left Wrist)   Pulse (!) 127   Temp 98 F (36.7 C) (Oral)   Resp 17   Ht 4\' 10"  (1.473 m)   Wt 74.9 kg   SpO2 96%   BMI 34.51 kg/m   Intake/Output Summary (Last 24 hours) at 04/02/2022 0949 Last data filed at 04/02/2022 0801 Gross per 24 hour  Intake 2928.99 ml  Output 1425 ml  Net 1503.99 ml   Intake/Output: I/O last 3 completed shifts: In: 3251 [P.O.:690; I.V.:2214; IV Piggyback:347] Out: 2625 [Urine:2625]  Intake/Output this shift:  Total I/O In: 86.5 [I.V.:86.5] Out: -  Weight change: 1 kg Gen: NAD, sitting in recliner CVS: RRR Resp: CTA, normal WOB Abd: +BS, soft, NT/ND Ext: no edema Neuro: awake, alert  Recent Labs  Lab 03/27/22 0311 03/28/22 0530 03/29/22 0255 03/29/22 2207 03/30/22 0348 03/31/22 0620 03/31/22 2150 04/01/22 0320 04/02/22 0309  NA 140 136 136 134* 137 134* 141 142 137  K 4.3 4.2 4.6 4.8 5.3* 4.7 4.4 4.5 4.4  CL 105 104 107 109 111 109 109 111 106  CO2 23 20* 17* 14* 14* 11* 17* 18* 16*  GLUCOSE 123* 116* 157* 120* 113* 118* 105* 93 90  BUN 73* 74* 72* 63* 63* 53* 50* 49* 45*  CREATININE 4.15* 4.17* 4.19* 4.00* 4.24* 3.93* 4.20* 4.17* 4.24*  ALBUMIN 2.0* 1.9* 1.9*  --  <1.5* 1.8*  --  1.7* 1.8*  CALCIUM 9.0 8.7* 8.9 8.2* 8.5* 8.3* 8.5* 8.2* 8.5*  PHOS 6.8* 5.9* 6.8*  --  8.0* 7.1*  --  9.0* 6.8*   Liver Function Tests: Recent Labs  Lab 03/31/22 0620 04/01/22 0320 04/02/22 0309  ALBUMIN 1.8* 1.7* 1.8*   No results for input(s): "LIPASE", "AMYLASE" in the last 168 hours. No results for input(s): "AMMONIA" in the last 168 hours. CBC: Recent Labs  Lab 03/29/22 2207 03/30/22 0349 03/31/22 0620  03/31/22 2150 04/01/22 0320 04/01/22 1052 04/01/22 1836 04/02/22 0309  WBC 13.9*   < > 14.9* 14.5* 12.7* 15.2* 14.2* 14.0*  NEUTROABS 11.3*  --  12.4*  --   --   --   --   --   HGB 6.8*   < > 10.4* 12.4 11.6* 13.1 11.4* 11.5*  HCT 19.7*   < > 29.9* 36.1 34.2* 39.3 33.0* 34.9*  MCV 87.9   < > 88.2 87.2 88.4 88.7 87.3 89.5  PLT 224   < > 249 305 312 382 360 379   < > = values in this interval not displayed.   Cardiac Enzymes: No results for input(s): "CKTOTAL", "CKMB", "CKMBINDEX", "TROPONINI" in the last 168 hours. CBG: Recent Labs  Lab 04/01/22 1548 04/01/22 1916 04/01/22 2326 04/02/22 0311 04/02/22 0745  GLUCAP 92 147* 85 91 104*    Iron Studies: No results for input(s): "IRON", "TIBC", "TRANSFERRIN", "FERRITIN" in the last 72 hours. Studies/Results: No results found.  sodium chloride   Intravenous Once   arformoterol  15 mcg Nebulization BID   budesonide (PULMICORT) nebulizer solution  0.5 mg Nebulization BID   Chlorhexidine Gluconate Cloth  6 each Topical Daily   darbepoetin (ARANESP) injection - DIALYSIS  40 mcg Subcutaneous Q Fri-1800  insulin aspart  0-15 Units Subcutaneous Q4H   multivitamin  1 tablet Oral QHS   nortriptyline  50 mg Oral QHS   mouth rinse  15 mL Mouth Rinse 4 times per day   pantoprazole (PROTONIX) IV  40 mg Intravenous Q12H   QUEtiapine  300 mg Oral QHS   revefenacin  175 mcg Nebulization Daily   sodium chloride flush  10-40 mL Intracatheter Q12H   Vilazodone HCl  40 mg Oral Daily    BMET    Component Value Date/Time   NA 137 04/02/2022 0309   NA 137 04/20/2012 1725   K 4.4 04/02/2022 0309   K 4.2 04/20/2012 1725   CL 106 04/02/2022 0309   CL 106 04/20/2012 1725   CO2 16 (L) 04/02/2022 0309   CO2 24 04/20/2012 1725   GLUCOSE 90 04/02/2022 0309   GLUCOSE 97 04/20/2012 1725   BUN 45 (H) 04/02/2022 0309   BUN 14 04/20/2012 1725   CREATININE 4.24 (H) 04/02/2022 0309   CREATININE 0.83 09/30/2019 1057   CALCIUM 8.5 (L) 04/02/2022 0309    CALCIUM 9.2 04/20/2012 1725   GFRNONAA 12 (L) 04/02/2022 0309   GFRNONAA 81 09/30/2019 1057   GFRAA 93 09/30/2019 1057   CBC    Component Value Date/Time   WBC 14.0 (H) 04/02/2022 0309   RBC 3.90 04/02/2022 0309   HGB 11.5 (L) 04/02/2022 0309   HGB 16.7 (H) 04/20/2012 1725   HCT 34.9 (L) 04/02/2022 0309   HCT 50.0 (H) 04/20/2012 1725   PLT 379 04/02/2022 0309   PLT 331 04/20/2012 1725   MCV 89.5 04/02/2022 0309   MCV 98 04/20/2012 1725   MCH 29.5 04/02/2022 0309   MCHC 33.0 04/02/2022 0309   RDW 17.3 (H) 04/02/2022 0309   RDW 14.2 04/20/2012 1725   LYMPHSABS 1.3 03/31/2022 0620   LYMPHSABS 2.7 08/27/2011 0558   MONOABS 0.3 03/31/2022 0620   MONOABS 0.5 08/27/2011 0558   EOSABS 0.4 03/31/2022 0620   EOSABS 0.5 08/27/2011 0558   BASOSABS 0.0 03/31/2022 0620   BASOSABS 0.1 08/27/2011 0558    Assessment/ Plan: AKI - crt of 0.8 in August of 2023, bland UA in October of 23.  Now with AKI , proteinuria and hematuria but also pyuria.  Seems most likely to be AKI from ATN in the setting of bacteremia. Given the proteinuria and hematuria concerning for GN, some serologies were sent and were generally negative (see above). 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 SP CRRT 1/02- 1/11. Tx'd to [facility] 1/11. Her last HD session was 03/25/22 and right femoral HD catheter removed 03/26/22. Unfortunately she had another CT angio 1/21 so will need to continue to follow UOP and daily labs.  Will likely require colectomy which could further complicate her renal recovery. Cr relatively stable today at 4.2 (has been stable in the 4's since d/c'ing HD) Remains nonoliguric. Will c/w gentle IVF given NPO status: LR 75cc/hr x 1 day No indication to resume IHD at this time Will follow response and decide if she will need a tunneled catheter and ongoing HD.   ABLA with evidence of acute GI bleed - pt with hematochezia last night and this morning.   Receiving blood transfusion.  Unfortunately colonoscopy and CT angio x 2 could not isolate location of GIB, now pointing to diffuse colonic bleeding due to diffuse ulcerations and colitis.  Surgery consulted for possible total colectomy however very high risk  therefore not a surgical candidate Hyperkalemia - lokelma x 2 and switching TF's to Nepro which has resolved issue. K WNL today Volume - persistent infiltrates on CXR are likely infection-related.  HTN - BP's have improved.  Back on scheduled meds.  Normocytic Anemia  - deferring IV iron in setting of infection.  Aranesp 40 mcg every Thursday.  Gi following for ABLA. Hgb stable at 11.5 today Strep bacteremia-  streptococcus infantarius, TEE negative, getting Rocephin thru 1/17. Colonoscopy when more stable per pmd.  Acute hypoxic respiratory failure - extubated on 1/10, on high level [place] O2 now. Repeat CXR's showed persistent bilat infiltrates as above H/o polysubstance abuse  Gean Quint, MD [facility]

## 2022-04-02 NOTE — Progress Notes (Signed)
OT Cancellation Note  Patient Details Name: Darlene Barber MRN: [id number] DOB: 06/02/1965   Cancelled Treatment:    Reason Eval/Treat Not Completed: Other (comment).  Transferred to 5W, will continue as schedule allows.    Darlene Barber D Yaron Grasse 04/02/2022, 3:05 PM 04/02/2022  RP, OTR/L  Acute Rehabilitation Services  Office:  [phone]

## 2022-04-02 NOTE — Progress Notes (Signed)
Physical Therapy Treatment Patient Details Name: Darlene Barber MRN: [id number] DOB: [birth year] Today's Date: 04/02/2022   History of Present Illness 57 yo female admitted 12/30 to [facility] with abdominal pain and rash with sepsis and AKI. Respiratory failure intubated 1/2-1/11. CRRT (1/3-1/8, 1/10-1/11). pt also with metabolic encephalopathy acutely. PMhx: chronic pain, GERD, bipolar, tobacco and THC abuse, insomnia, HTN, tremor    PT Comments    Pt admitted with above diagnosis. Pt was able to take pivotal steps with +2 min to mod assist with RW. Pt very anxious which decr her mobility safety. Pt was able to ambulate a few steps with +2 min to mod assist with RW and with incr cues. Pt unsteady on her feet. Will continue to follow acutely.  Pt currently with functional limitations due to balance and endurance deficits. Pt will benefit from skilled PT to increase their independence and safety with mobility to allow discharge to the venue listed below.      Recommendations for follow up therapy are one component of a multi-disciplinary discharge planning process, led by the attending physician.  Recommendations may be updated based on patient status, additional functional criteria and insurance authorization.  Follow Up Recommendations  Skilled nursing-short term rehab (<3 hours/day) Can patient physically be transported by private vehicle: No   Assistance Recommended at Discharge Frequent or constant Supervision/Assistance  Patient can return home with the following  (cannot return home to 2nd floor apartment)   Equipment Recommendations  Rolling walker (2 wheels)    Recommendations for Other Services OT consult     Precautions / Restrictions Precautions Precautions: Fall;Other (comment) Precaution Comments: cortrak Restrictions Weight Bearing Restrictions: No     Mobility  Bed Mobility Overal bed mobility: Needs Assistance         Sit to supine: Min assist   General bed  mobility comments: Pt in chair ona rrival.  Only needed min assist to get feet back into bed.    Transfers Overall transfer level: Needs assistance Equipment used: Rolling walker (2 wheels) Transfers: Sit to/from Stand, Bed to chair/wheelchair/BSC Sit to Stand: Min assist, +2 safety/equipment, Mod assist Stand pivot transfers: Min assist, Mod assist, +2 safety/equipment         General transfer comment: Pt very anxious. Needs max encouragment to stand to RW. pt needing min to mod assist as she gets so anxious she will not grasp RW at all times and needs assist for balance.  She really only needs 1 person but feels safer with 2 persons.    Ambulation/Gait Ambulation/Gait assistance: Min assist, Mod assist, +2 safety/equipment Gait Distance (Feet): 4 Feet Assistive device: Rolling walker (2 wheels) Gait Pattern/deviations: Step-through pattern, Decreased stride length, Trunk flexed, Wide base of support, Drifts right/left   Gait velocity interpretation: <1.31 ft/sec, indicative of household ambulator   General Gait Details: Pt took several steps from 3N1 to bed wtih slighly unsteady gait and cues to keep hands on RW. Did not reach back once close enough to bed with poor overall safety awareness.   Stairs             Wheelchair Mobility    Modified Rankin (Stroke Patients Only)       Balance Overall balance assessment: Needs assistance Sitting-balance support: No upper extremity supported, Feet unsupported Sitting balance-Leahy Scale: Fair Sitting balance - Comments: Initially min A EOB and progressing to min guard A feet not supported   Standing balance support: Bilateral upper extremity supported, During functional activity Standing balance-Leahy Scale:  Poor Standing balance comment: relies on RW and external support of 2 persons                            Cognition Arousal/Alertness: Awake/alert Behavior During Therapy: Flat affect                                    General Comments: Following commands with increased time, light encouragement, and multimodal cues. Observed to be anxious with all movement and easy to startle. Slow processing noted throughout        Exercises      General Comments General comments (skin integrity, edema, etc.): VSS      Pertinent Vitals/Pain Pain Assessment Pain Assessment: Faces Faces Pain Scale: Hurts little more Pain Location: head; back; appeared to be sore to touch of R foot Pain Descriptors / Indicators: Sore, Headache Pain Intervention(s): Limited activity within patient's tolerance, Monitored during session, Repositioned    Home Living                          Prior Function            PT Goals (current goals can now be found in the care plan section) Acute Rehab PT Goals Patient Stated Goal: unable to state; boyfriend wants her to get well and able to return home Progress towards PT goals: Progressing toward goals    Frequency    Min 2X/week      PT Plan Current plan remains appropriate    Co-evaluation              AM-PAC PT "6 Clicks" Mobility   Outcome Measure  Help needed turning from your back to your side while in a flat bed without using bedrails?: A Little Help needed moving from lying on your back to sitting on the side of a flat bed without using bedrails?: A Little Help needed moving to and from a bed to a chair (including a wheelchair)?: Total Help needed standing up from a chair using your arms (e.g., wheelchair or bedside chair)?: Total Help needed to walk in hospital room?: Total Help needed climbing 3-5 steps with a railing? : Total 6 Click Score: 10    End of Session Equipment Utilized During Treatment: Gait belt Activity Tolerance: Patient limited by fatigue Patient left: in bed;with call bell/phone within reach;with bed alarm set;with nursing/sitter in room;with family/visitor present Nurse Communication: Mobility  status PT Visit Diagnosis: Muscle weakness (generalized) (M62.81);Difficulty in walking, not elsewhere classified (R26.2)     Time: 4967-5916 PT Time Calculation (min) (ACUTE ONLY): 31 min  Charges:  $Therapeutic Activity: 23-37 mins                     [facility] M,PT Acute Rehab Services [phone]    Alvira Philips 04/02/2022, 12:31 PM

## 2022-04-02 NOTE — Progress Notes (Signed)
Pt complains of 9/10 headache despite treating with PRN Tylenol. Pt reports past medical history of migraines and is requesting something else for headache. Reached out to [facility].

## 2022-04-02 NOTE — Progress Notes (Addendum)
NAME:  Darlene Barber, MRN:  [id number], DOB:  [birth year], LOS: 23 ADMISSION DATE:  03/10/2022, CONSULTATION DATE:  12/30  REFERRING MD:  Dr. Cinda Quest, CHIEF COMPLAINT:  Abdominal Pain    History of Present Illness:  [name] who presented to the [facility] ER on 12/31 with reports of abdominal pain and widespread rash.  On admission, the patient significant other reported that the day after eating at a local restaurant in [place], [place] she developed abdominal pain and rash.  She was reportedly up all night with crampy abdominal pain associated with nausea and vomiting.  Around 2 PM on the day of admission she noticed a rash all over her body and had worsening pain prompting him to call EMS.  She was evaluated in the emergency room and was found to have oxygen saturations of 87% on 2 L, afebrile with an elevated white blood cell count of 29.2.  Additional labs notable for acute kidney injury, anion gap metabolic acidosis and LFTs with mixed hepatic and cholestatic picture.  Stool samples were sent with workup of acute diarrhea.  Stool pathogen panel was negative.  C. difficile toxin was negative.  She was treated with 30 mL/kg IV fluid and started on empiric broad-spectrum antibiotics with vancomycin, cefepime and Flagyl.  She remained hypotensive despite IV fluid boluses and was started on vasopressors.  She was transferred to [facility] as there were no inpatient beds at [facility].    PCCM consulted for ICU admission.  Pertinent  Medical History  Chronic Pain Syndrome  Cervical Spondylitis  GERD  Bipolar Disorder  Tobacco Abuse - daily  THC Abuse - daily   Significant Hospital Events: Including procedures, antibiotic start and stop dates in addition to other pertinent events   12/31 admit with abdominal pain, diarrhea.  Left IJ TLC placement.  UDS positive for tricyclics, THC and amphetamines.  C. difficile negative, GI PCR negative.  COVID, flu, RSV negative.  Antibiotics narrowed to Zosyn.  BC positives for strep infantarius.  CT chest abdomen pelvis with fluid throughout the stomach, small bowel and colon, nonspecific but may represent diarrheal state.  No bowel obstruction, definite bowel wall thickening or pneumoperitoneum.  Fluid within the esophagus may represent reflux or nonspecific esophageal motility disorder.  Trace amount of free pelvic fluid, nonspecific.   1/3 CRRT, PEEP 5/60% 1/4 CRRT with negative balance. Precedex added to Fentanyl.  1/5 CT chest abd pelvis, TEE done with no evidence of vegetations 1/8 Taken off CRRT, MIVF started 1/9 too wild on precedex, severe HTN prevented extubation 1/10 restarted on CRRT 1/11 extubated, transferred from [facility] for iHD 1/13 Clonazepam 0.5 twice daily, Seroquel to twice daily 1/14 Precedex weaned to off this morning 03/29/2022 critical care called back for new GI bleed transferred back to intensive care unit GI has been consulted 1/20 continue GI bleed overnight, hemoglobin dropped to 6.41 unit PRBCs ordered 1/21 recurrent GI bleed overnight with additional PRBCs, platelets, and FFP ordered.  Decision made to engage general surgery again.  Interim History / Subjective:   Weaned off precedex overnight. No further episodes of bright red blood per rectum  Objective   Blood pressure (!) 146/92, pulse (!) 112, temperature 98 F (36.7 C), temperature source Oral, resp. rate 20, height 4\' 10"  (1.473 m), weight 74.9 kg, SpO2 98 %.        Intake/Output Summary (Last 24 hours) at 04/02/2022 0750 Last data filed at 04/02/2022 0700 Gross per 24 hour  Intake 2881.6  ml  Output 1700 ml  Net 1181.6 ml   Filed Weights   03/31/22 0452 04/01/22 0400 04/02/22 0500  Weight: 74.8 kg 73.9 kg 74.9 kg    Examination: General: chronically ill appearing woman, sitting up in chair HEENT: [place]/AT, MM pink/moist, PERRL,  Neuro: A&O x 3 CV: s1s2 regular rate and rhythm, no murmur, rubs, or gallops,  PULM: Clear to auscultation bilaterally, no  increased work of breathing, no added breath sounds GI: soft, bowel sounds active in all 4 quadrants, non-tender, non-distended Extremities: warm/dry, no edema  Skin: no rashes or lesions  Resolved Hospital Problem list   Hypotension - sepsis + sedation  Transaminitis Shock Liver Rash, Hives TCP related to CRRT, sepsis Multifocal pneumonia, possible VAP, completed course 1/11 Possible melena  Assessment & Plan:  GI bleeding in the setting of multiple colonic ulcers ischemic related -Colonoscopy performed 1/20 with multiple colonic ulcers seen unable to identify active bleed with poor visualization.  GI recommended either repeating CTA to help with an upper bleed versus colectomy.  CTA was repeated overnight 1/21 with no signs of bleeding seen despite gross active GI bleed.  General surgery updated and is following. Acute blood loss anemia superimposed on normocytic anemia  P: General surgery and GI following, appreciate assistance No plan for surgery at this time H/H remains stable Transfuse per protocol Hemoglobin goal greater than 7 Maintain 2 large-bore IVs Continue IV Protonix  Acute Metabolic / Toxic Encephalopathy  -Remains confused, awakens to voice and tracking but not following commands or speaking. Likely related to sedation in setting of poor renal function. P: Seroquel 300mg  qHS Daily vilazodone 40mg  As needed IV Haldol  Renal Failure, likely ATN   -SP CRRT 1/02- 1/11. Tx'd to [facility] 1/11  -Received another contrast load overnight of 1/21 for repeat CTA, this is likely to result in recurrent and/or permanent renal failure.  P: Nephrology following, appreciate assistance Follow renal function Monitor urine output Avoid nephrotoxins as able Ensure adequate renal perfusion LR at 35mL/hr  UDS Positive: Amphetamines, Cannabis, TCA's  P: Cessation consulting when appropriate   Bipolar Disorder Chronic Pain  P: Continue Vilazodone and Seroquel   Chronic  Nausea P: - PRN zofran and phenergan  Hyperglycemia P: SSI and TF coverage  CBG goal 140-180  HTN P: Resume amlodipine Continuous telemetry   At Risk Malnutrition  Hypoalonemia  P: Continue clears  Hyperkalemia  P: Trend Biochemist, clinical (right click and "Reselect all SmartList Selections" daily)   Diet/type: clear liquids  DVT prophylaxis: not indicated GI prophylaxis: PPI Lines: Central line and Dialysis Catheter (PICC 1/10-, R fem HD cath 1/2-)   Foley:  Yes, and it is still needed Code Status:  full code Last date of multidisciplinary goals of care discussion: Discussed with son POA Meggan Dhaliwal 01/28/23  CRITICAL CARE Performed by: Freddi Starr  Total critical care time: 35 minutes  Critical care time was exclusive of separately billable procedures and treating other patients.  Critical care was necessary to treat or prevent imminent or life-threatening deterioration.  Critical care was time spent personally by me on the following activities: development of treatment plan with patient and/or surrogate as well as nursing, discussions with consultants, evaluation of patient's response to treatment, examination of patient, obtaining history from patient or surrogate, ordering and performing treatments and interventions, ordering and review of laboratory studies, ordering and review of radiographic studies, pulse oximetry and re-evaluation of patient's condition.  Freda Jackson, MD [facility] Office: [phone]   See Amion for personal pager PCCM on call pager [phone] until 7pm. Please call Elink 7p-7a. [phone]

## 2022-04-02 NOTE — Progress Notes (Signed)
Progress Note  3 Days Post-Op  Subjective: Pt still having some bloody BMs but smaller volume. She reports some nausea, RN reports mostly dry heaving. Pt slightly confused but family member at bedside and agree with avoiding surgery if possible.   Objective: Vital signs in last 24 hours: Temp:  [97.7 F (36.5 C)-99.1 F (37.3 C)] 98 F (36.7 C) (01/23 0747) Pulse Rate:  [100-127] 117 (01/23 1100) Resp:  [13-26] 17 (01/23 1100) BP: (86-177)/(60-117) 168/117 (01/23 1000) SpO2:  [93 %-100 %] 98 % (01/23 1100) Weight:  [74.9 kg] 74.9 kg (01/23 0500) Last BM Date : 04/02/22  Intake/Output from previous day: 01/22 0701 - 01/23 0700 In: 2881.9 [P.O.:690; I.V.:1945; IV Piggyback:247] Out: 1700 [Urine:1700] Intake/Output this shift: Total I/O In: 340.2 [I.V.:340.2] Out: -   PE: General: WD, overweight female who is on [facility] and chronically ill appearing  Heart: regular, rate, and rhythm.   Lungs: Respiratory effort nonlabored Abd: soft, generalized ttp without peritonitis, mild distention, bloody stool in [facility]   Lab Results:  Recent Labs    04/01/22 1836 04/02/22 0309  WBC 14.2* 14.0*  HGB 11.4* 11.5*  HCT 33.0* 34.9*  PLT 360 379    BMET Recent Labs    04/01/22 0320 04/02/22 0309  NA 142 137  K 4.5 4.4  CL 111 106  CO2 18* 16*  GLUCOSE 93 90  BUN 49* 45*  CREATININE 4.17* 4.24*  CALCIUM 8.2* 8.5*    PT/INR Recent Labs    03/30/22 2215 03/31/22 0620  LABPROT 16.6* 16.6*  INR 1.4* 1.4*    CMP     Component Value Date/Time   NA 137 04/02/2022 0309   NA 137 04/20/2012 1725   K 4.4 04/02/2022 0309   K 4.2 04/20/2012 1725   CL 106 04/02/2022 0309   CL 106 04/20/2012 1725   CO2 16 (L) 04/02/2022 0309   CO2 24 04/20/2012 1725   GLUCOSE 90 04/02/2022 0309   GLUCOSE 97 04/20/2012 1725   BUN 45 (H) 04/02/2022 0309   BUN 14 04/20/2012 1725   CREATININE 4.24 (H) 04/02/2022 0309   CREATININE 0.83 09/30/2019 1057   CALCIUM 8.5 (L) 04/02/2022 0309    CALCIUM 9.2 04/20/2012 1725   PROT 5.6 (L) 03/26/2022 0734   PROT 7.9 04/20/2012 1725   ALBUMIN 1.8 (L) 04/02/2022 0309   ALBUMIN 4.0 04/20/2012 1725   AST 24 03/26/2022 0734   AST 26 04/20/2012 1725   ALT 27 03/26/2022 0734   ALT 39 04/20/2012 1725   ALKPHOS 94 03/26/2022 0734   ALKPHOS 123 04/20/2012 1725   BILITOT 0.4 03/26/2022 0734   BILITOT 0.5 04/20/2012 1725   GFRNONAA 12 (L) 04/02/2022 0309   GFRNONAA 81 09/30/2019 1057   GFRAA 93 09/30/2019 1057   Lipase     Component Value Date/Time   LIPASE 88 (H) 03/09/2022 2204       Studies/Results: No results found.  Anti-infectives: Anti-infectives (From admission, onward)    Start     Dose/Rate Route Frequency Ordered Stop   03/31/22 0600  piperacillin-tazobactam (ZOSYN) IVPB 2.25 g       See Hyperspace for full Linked Orders Report.   2.25 g 100 mL/hr over 30 Minutes Intravenous Every 8 hours 03/30/22 1801 04/05/22 2359   03/30/22 1845  piperacillin-tazobactam (ZOSYN) IVPB 3.375 g       See Hyperspace for full Linked Orders Report.   3.375 g 100 mL/hr over 30 Minutes Intravenous  Once 03/30/22 1801 03/30/22 1850  03/21/22 1000  cefTRIAXone (ROCEPHIN) 2 g in sodium chloride 0.9 % 100 mL IVPB        2 g 200 mL/hr over 30 Minutes Intravenous Every 24 hours 03/21/22 0740 03/28/22 0326   03/20/22 1200  piperacillin-tazobactam (ZOSYN) IVPB 3.375 g  Status:  Discontinued        3.375 g 12.5 mL/hr over 240 Minutes Intravenous Every 8 hours 03/20/22 0921 03/21/22 0740   03/19/22 1200  piperacillin-tazobactam (ZOSYN) IVPB 2.25 g  Status:  Discontinued        2.25 g 100 mL/hr over 30 Minutes Intravenous Every 8 hours 03/19/22 0931 03/20/22 0921   03/18/22 2000  piperacillin-tazobactam (ZOSYN) IVPB 3.375 g  Status:  Discontinued        3.375 g 12.5 mL/hr over 240 Minutes Intravenous Every 8 hours 03/18/22 1355 03/19/22 0931   03/16/22 1015  piperacillin-tazobactam (ZOSYN) IVPB 3.375 g  Status:  Discontinued        3.375  g 100 mL/hr over 30 Minutes Intravenous Every 6 hours 03/16/22 1007 03/18/22 1355   03/16/22 1000  piperacillin-tazobactam (ZOSYN) IVPB 3.375 g  Status:  Discontinued        3.375 g 12.5 mL/hr over 240 Minutes Intravenous Every 8 hours 03/16/22 0949 03/16/22 1007   03/12/22 1800  ampicillin (OMNIPEN) 2 g in sodium chloride 0.9 % 100 mL IVPB  Status:  Discontinued        2 g 300 mL/hr over 20 Minutes Intravenous Every 6 hours 03/12/22 1629 03/16/22 0919   03/12/22 1200  ampicillin (OMNIPEN) 2 g in sodium chloride 0.9 % 100 mL IVPB  Status:  Discontinued        2 g 300 mL/hr over 20 Minutes Intravenous 2 times daily 03/12/22 0945 03/12/22 1629   03/12/22 0600  vancomycin (VANCOREADY) IVPB 500 mg/100 mL  Status:  Discontinued        500 mg 100 mL/hr over 60 Minutes Intravenous Every 48 hours 03/11/22 2256 03/11/22 2307   03/11/22 2344  vancomycin variable dose per unstable renal function (pharmacist dosing)  Status:  Discontinued         Does not apply See admin instructions 03/11/22 2344 03/12/22 0945   03/11/22 2315  vancomycin (VANCOREADY) IVPB 1750 mg/350 mL  Status:  Discontinued        1,750 mg 175 mL/hr over 120 Minutes Intravenous  Once 03/11/22 2220 03/11/22 2250   03/10/22 1200  cefTRIAXone (ROCEPHIN) 2 g in sodium chloride 0.9 % 100 mL IVPB  Status:  Discontinued        2 g 200 mL/hr over 30 Minutes Intravenous Every 24 hours 03/10/22 0934 03/12/22 0945   03/10/22 0645  vancomycin (VANCOREADY) IVPB 1500 mg/300 mL        1,500 mg 150 mL/hr over 120 Minutes Intravenous  Once 03/10/22 0547 03/10/22 0848   03/10/22 0555  vancomycin variable dose per unstable renal function (pharmacist dosing)  Status:  Discontinued         Does not apply See admin instructions 03/10/22 0555 03/10/22 0931   03/10/22 0400  ceFEPIme (MAXIPIME) 2 g in sodium chloride 0.9 % 100 mL IVPB  Status:  Discontinued        2 g 200 mL/hr over 30 Minutes Intravenous Every 24 hours 03/10/22 0303 03/10/22 0931    03/10/22 0345  metroNIDAZOLE (FLAGYL) IVPB 500 mg  Status:  Discontinued        500 mg 100 mL/hr over 60 Minutes Intravenous Every 12 hours  03/10/22 0246 03/10/22 0931        Assessment/Plan GI bleeding secondary to severe gastroenteritis/colitis with ulcerations - s/p colonoscopy, bx pending - s/p transfusion 6 units from 1/20-1/21, hgb stable at 11.5 this AM - CTA without localized bleeding  - surgery would be last line for this patient without localized source of bleeding, TAC with risk of continued bleeding if ulcerations in small bowel continue to bleed. TAC would also result in ileostomy with high risk of dehydration from this. Recommend continued supportive care for now and no surgery unless no other options for medical management. We will follow   FEN: CLD, IVF - defer diet advancement to GI   VTE: SCDs, no chemical prophylaxis in setting of GI bleed ID: [email or web address]   LOS: 23 days   I reviewed Consultant GI notes, hospitalist notes, last 24 h vitals and pain scores, last 48 h intake and output, last 24 h labs and trends, and last 24 h imaging results.     Norm Parcel, [facility] Surgery 04/02/2022, 11:34 AM Please see Amion for pager number during day hours 7:00am-4:30pm

## 2022-04-03 DIAGNOSIS — D62 Acute posthemorrhagic anemia: Secondary | ICD-10-CM | POA: Diagnosis not present

## 2022-04-03 DIAGNOSIS — A419 Sepsis, unspecified organism: Secondary | ICD-10-CM | POA: Diagnosis not present

## 2022-04-03 DIAGNOSIS — G9340 Encephalopathy, unspecified: Secondary | ICD-10-CM | POA: Diagnosis not present

## 2022-04-03 DIAGNOSIS — K529 Noninfective gastroenteritis and colitis, unspecified: Principal | ICD-10-CM

## 2022-04-03 DIAGNOSIS — R7881 Bacteremia: Secondary | ICD-10-CM | POA: Diagnosis not present

## 2022-04-03 LAB — GLUCOSE, CAPILLARY
Glucose-Capillary: 114 mg/dL — ABNORMAL HIGH (ref 70–99)
Glucose-Capillary: 133 mg/dL — ABNORMAL HIGH (ref 70–99)
Glucose-Capillary: 81 mg/dL (ref 70–99)
Glucose-Capillary: 87 mg/dL (ref 70–99)
Glucose-Capillary: 92 mg/dL (ref 70–99)
Glucose-Capillary: 92 mg/dL (ref 70–99)
Glucose-Capillary: 95 mg/dL (ref 70–99)

## 2022-04-03 LAB — RENAL FUNCTION PANEL
Albumin: 1.7 g/dL — ABNORMAL LOW (ref 3.5–5.0)
Anion gap: 10 (ref 5–15)
BUN: 40 mg/dL — ABNORMAL HIGH (ref 6–20)
CO2: 20 mmol/L — ABNORMAL LOW (ref 22–32)
Calcium: 8.7 mg/dL — ABNORMAL LOW (ref 8.9–10.3)
Chloride: 110 mmol/L (ref 98–111)
Creatinine, Ser: 4.09 mg/dL — ABNORMAL HIGH (ref 0.44–1.00)
GFR, Estimated: 12 mL/min — ABNORMAL LOW (ref >60)
Glucose, Bld: 78 mg/dL (ref 70–99)
Phosphorus: 7 mg/dL — ABNORMAL HIGH (ref 2.5–4.6)
Potassium: 4.5 mmol/L (ref 3.5–5.1)
Sodium: 140 mmol/L (ref 135–145)

## 2022-04-03 LAB — CBC
HCT: 35 % — ABNORMAL LOW (ref 36.0–46.0)
Hemoglobin: 11.3 g/dL — ABNORMAL LOW (ref 12.0–15.0)
MCH: 29.3 pg (ref 26.0–34.0)
MCHC: 32.3 g/dL (ref 30.0–36.0)
MCV: 90.7 fL (ref 80.0–100.0)
Platelets: 413 10*3/uL — ABNORMAL HIGH (ref 150–400)
RBC: 3.86 MIL/uL — ABNORMAL LOW (ref 3.87–5.11)
RDW: 17.2 % — ABNORMAL HIGH (ref 11.5–15.5)
WBC: 13.3 10*3/uL — ABNORMAL HIGH (ref 4.0–10.5)
nRBC: 0.8 % — ABNORMAL HIGH (ref 0.0–0.2)

## 2022-04-03 LAB — SURGICAL PATHOLOGY

## 2022-04-03 MED ORDER — LACTATED RINGERS IV SOLN
INTRAVENOUS | Status: AC
  Administered 2022-04-04: 1000 mL via INTRAVENOUS

## 2022-04-03 MED ORDER — AQUAPHOR EX OINT
TOPICAL_OINTMENT | Freq: Every day | CUTANEOUS | Status: DC | PRN
  Filled 2022-04-03: qty 50

## 2022-04-03 NOTE — Progress Notes (Signed)
PROGRESS NOTE        PATIENT DETAILS Name: Darlene Barber Age: 57 y.o. Sex: female Date of Birth: 1966-02-15 Admit Date: 03/10/2022 Admitting Physician Rigoberto Noel, MD [id number], Chrystie Nose, MD  Brief Summary: Patient is a 57 y.o.  female who was initially admitted to the ICU with septic shock-respiratory failure requiring pressors/intubation on initial presentation.  She was subsequently was found to have Streptococcus bacteremia.  Hospital course was complicated by AKI requiring CRRT and acute metabolic encephalopathy/ICU delirium.  She was stabilized and transferred to [facility] on 1/17.  Subsequent hospital course complicated by lower GI bleeding with acute blood loss anemia-requiring transfer back to the ICU.  Ultimately she was found to have large ulcers in her entire colon.  She required multiple units of PRBC transfusion.  She was transferred back to the [facility] service on 1/24.  Significant events: 12/31>> admit to ICU-septic shock-intubated-required pressors stool studies/COVID/RSV/flu negative.  Blood cultures positive for strep 01/3 CRRT 01/5>> TEE done with no evidence of vegetations 01/11>>extubated, transferred from [facility] for iHD 01/17>> transfer to [facility] services 01/18>> developed lower GI bleeding-GI consulted-PRBC transfused 01/18>> transfer back to the ICU 01/20>> colonoscopy with multiple ulcers in colon. 01/24>> transferred to [facility]  Significant studies: 12/30>> CT chest/abdomen/pelvis: Fluid throughout stomach/small bowel/colon-nonspecific 01/05>> CT chest/abdomen/pelvis: Persistent areas of wall thickening along the colon, patchy bilateral ill-defined lung opacities 01/19>> CT abdomen/pelvis: Diarrheal illness with fluid reaching the rectum, colonic wall thickening is improved.  Improving bilateral PNA.  Bilateral nephrolithiasis without obstruction. 01/19>> CT angio GI bleed: Limited evaluation because of oral contrast within the distal small  bowel/throughout colon-active GI bleeding could not be assessed. 01/21>> CT angio GI bleed: No active GI bleeding, multiple infectious/inflammatory colitis involving splenic flexure and ascending colon.  Significant microbiology data: 12/30>> blood culture: Streptococcus Infantarius 12/30>> stool C. difficile negative 12/30>> GI pathogen panel: Negative 01/02>> tracheal aspirate: No organisms 01/03>> blood culture: No growth 01/03>> blood culture: No growth 01/17>> blood culture: No growth  Procedures: 12/31-1/11>> ETT 1/05>> TEE: EF 60-65%-no vegetations 1/20>> colonoscopy: Blood in entire colon, multiple ulcers throughout the colon.  Consults: PCCM GI General surgery Nephrology ID  Subjective: Lying comfortably in bed-denies any chest pain or shortness of breath.  Had brown stools this morning.  No hematochezia since yesterday afternoon.  Objective: Vitals: Blood pressure (!) 151/81, pulse (!) 108, temperature 99 F (37.2 C), temperature source Oral, resp. rate 15, height 4\' 10"  (1.473 m), weight 74.9 kg, SpO2 96 %.   Exam: Gen Exam:Alert awake-not in any distress HEENT:atraumatic, normocephalic Chest: B/L clear to auscultation anteriorly CVS:S1S2 regular Abdomen: Soft-mild-but diffusely tender all over. Extremities:no edema Neurology: Non focal Skin: no rash  Pertinent Labs/Radiology:    Latest Ref Rng & Units 04/03/2022    4:26 AM 04/02/2022    3:09 AM 04/01/2022    6:36 PM  CBC  WBC 4.0 - 10.5 K/uL 13.3  14.0  14.2   Hemoglobin 12.0 - 15.0 g/dL 11.3  11.5  11.4   Hematocrit 36.0 - 46.0 % 35.0  34.9  33.0   Platelets 150 - 400 K/uL 413  379  360     Lab Results  Component Value Date   NA 140 04/03/2022   K 4.5 04/03/2022   CL 110 04/03/2022   CO2 20 (L) 04/03/2022      Assessment/Plan: Septic shock  due to Streptococcus Infantarius bacteremia Multifocal PNA-possibly ventilator associated PNA Sepsis physiology has resolved Had completed a course of  antibiotics on 1/17-now back on Zosyn for severe colitis  TEE negative for vegetations  Acute toxic/metabolic encephalopathy ICU delirium Multifactorial etiology-ICU delirium/encephalopathy due to sepsis/AKI Much improved-awake/alert this morning. Maintain delirium precautions Continue Seroquel 300 nightly Continue vilazodone  AKI Multifactorial etiology-felt to be ischemic ATN from septic shock-possibly some component of infectious (strep) glomerulonephritis-and now possibly contrast related (CT angio)  Avoid nephrotoxic agents Nephrology following Defer decision to resume HD/CRRT per nephrology service  Shock liver Improved Trend LFTs periodically  Lower GI bleeding due to severe colitis with multiple colon ulcers Acute blood loss anemia Follow CBC trend (8 units PRBC from 1/18-1/21) CT angio without any localized bleeding Empirically on Zosyn since 1/20 Recent stool studies negative Diet being slowly advanced Supportive care Await pathology GI/general surgery following  HTN BP stable Continue amlodipine  GERD PPI  Bipolar disorder/depression Complicated by encephalopathy Continue Seroquel/vilazodone  COPD Not in exacerbation-continue bronchodilators  OSA CPAP when able  Nutrition Status: Nutrition Problem: Inadequate oral intake Etiology: inability to eat Signs/Symptoms: NPO status (on vent) Interventions: Refer to RD note for recommendations  Obesity: Estimated body mass index is 34.51 kg/m as calculated from the following:   Height as of this encounter: 4\' 10"  (1.473 m).   Weight as of this encounter: 74.9 kg.   Code status:   Code Status: Full Code   DVT Prophylaxis: Place and maintain sequential compression device Start: 03/13/22 2117 SCDs Start: 03/10/22 0232   Family Communication: None at bedside   Disposition Plan: Status is: Inpatient Remains inpatient appropriate because: Severity of illness   Planned Discharge  Destination:Skilled nursing facility   Diet: Diet Order             Diet clear liquid Room service appropriate? Yes; Fluid consistency: Thin  Diet effective now                     Antimicrobial agents: Anti-infectives (From admission, onward)    Start     Dose/Rate Route Frequency Ordered Stop   03/31/22 0600  piperacillin-tazobactam (ZOSYN) IVPB 2.25 g       See Hyperspace for full Linked Orders Report.   2.25 g 100 mL/hr over 30 Minutes Intravenous Every 8 hours 03/30/22 1801 04/05/22 2359   03/30/22 1845  piperacillin-tazobactam (ZOSYN) IVPB 3.375 g       See Hyperspace for full Linked Orders Report.   3.375 g 100 mL/hr over 30 Minutes Intravenous  Once 03/30/22 1801 03/30/22 1850   03/21/22 1000  cefTRIAXone (ROCEPHIN) 2 g in sodium chloride 0.9 % 100 mL IVPB        2 g 200 mL/hr over 30 Minutes Intravenous Every 24 hours 03/21/22 0740 03/28/22 0326   03/20/22 1200  piperacillin-tazobactam (ZOSYN) IVPB 3.375 g  Status:  Discontinued        3.375 g 12.5 mL/hr over 240 Minutes Intravenous Every 8 hours 03/20/22 0921 03/21/22 0740   03/19/22 1200  piperacillin-tazobactam (ZOSYN) IVPB 2.25 g  Status:  Discontinued        2.25 g 100 mL/hr over 30 Minutes Intravenous Every 8 hours 03/19/22 0931 03/20/22 0921   03/18/22 2000  piperacillin-tazobactam (ZOSYN) IVPB 3.375 g  Status:  Discontinued        3.375 g 12.5 mL/hr over 240 Minutes Intravenous Every 8 hours 03/18/22 1355 03/19/22 0931   03/16/22 1015  piperacillin-tazobactam (ZOSYN) IVPB 3.375  g  Status:  Discontinued        3.375 g 100 mL/hr over 30 Minutes Intravenous Every 6 hours 03/16/22 1007 03/18/22 1355   03/16/22 1000  piperacillin-tazobactam (ZOSYN) IVPB 3.375 g  Status:  Discontinued        3.375 g 12.5 mL/hr over 240 Minutes Intravenous Every 8 hours 03/16/22 0949 03/16/22 1007   03/12/22 1800  ampicillin (OMNIPEN) 2 g in sodium chloride 0.9 % 100 mL IVPB  Status:  Discontinued        2 g 300 mL/hr over  20 Minutes Intravenous Every 6 hours 03/12/22 1629 03/16/22 0919   03/12/22 1200  ampicillin (OMNIPEN) 2 g in sodium chloride 0.9 % 100 mL IVPB  Status:  Discontinued        2 g 300 mL/hr over 20 Minutes Intravenous 2 times daily 03/12/22 0945 03/12/22 1629   03/12/22 0600  vancomycin (VANCOREADY) IVPB 500 mg/100 mL  Status:  Discontinued        500 mg 100 mL/hr over 60 Minutes Intravenous Every 48 hours 03/11/22 2256 03/11/22 2307   03/11/22 2344  vancomycin variable dose per unstable renal function (pharmacist dosing)  Status:  Discontinued         Does not apply See admin instructions 03/11/22 2344 03/12/22 0945   03/11/22 2315  vancomycin (VANCOREADY) IVPB 1750 mg/350 mL  Status:  Discontinued        1,750 mg 175 mL/hr over 120 Minutes Intravenous  Once 03/11/22 2220 03/11/22 2250   03/10/22 1200  cefTRIAXone (ROCEPHIN) 2 g in sodium chloride 0.9 % 100 mL IVPB  Status:  Discontinued        2 g 200 mL/hr over 30 Minutes Intravenous Every 24 hours 03/10/22 0934 03/12/22 0945   03/10/22 0645  vancomycin (VANCOREADY) IVPB 1500 mg/300 mL        1,500 mg 150 mL/hr over 120 Minutes Intravenous  Once 03/10/22 0547 03/10/22 0848   03/10/22 0555  vancomycin variable dose per unstable renal function (pharmacist dosing)  Status:  Discontinued         Does not apply See admin instructions 03/10/22 0555 03/10/22 0931   03/10/22 0400  ceFEPIme (MAXIPIME) 2 g in sodium chloride 0.9 % 100 mL IVPB  Status:  Discontinued        2 g 200 mL/hr over 30 Minutes Intravenous Every 24 hours 03/10/22 0303 03/10/22 0931   03/10/22 0345  metroNIDAZOLE (FLAGYL) IVPB 500 mg  Status:  Discontinued        500 mg 100 mL/hr over 60 Minutes Intravenous Every 12 hours 03/10/22 0246 03/10/22 0931        MEDICATIONS: Scheduled Meds:  sodium chloride   Intravenous Once   amLODipine  10 mg Oral Daily   arformoterol  15 mcg Nebulization BID   budesonide (PULMICORT) nebulizer solution  0.5 mg Nebulization BID    Chlorhexidine Gluconate Cloth  6 each Topical Daily   darbepoetin (ARANESP) injection - DIALYSIS  40 mcg Subcutaneous Q Fri-1800   feeding supplement  1 Container Oral TID BM   insulin aspart  0-15 Units Subcutaneous Q4H   multivitamin  1 tablet Oral QHS   nortriptyline  50 mg Oral QHS   mouth rinse  15 mL Mouth Rinse 4 times per day   pantoprazole (PROTONIX) IV  40 mg Intravenous Q24H   QUEtiapine  300 mg Oral QHS   revefenacin  175 mcg Nebulization Daily   sodium chloride flush  10-40 mL Intracatheter  Q12H   Vilazodone HCl  40 mg Oral Daily   Continuous Infusions:  sodium chloride 10 mL/hr at 04/02/22 1500   sodium chloride 10 mL (04/01/22 1039)   dextrose 5 % and 0.45% NaCl Stopped (03/31/22 0730)   lactated ringers     piperacillin-tazobactam (ZOSYN)  IV 2.25 g (04/03/22 0613)   PRN Meds:.sodium chloride, acetaminophen, albuterol, docusate, haloperidol lactate, labetalol, lip balm, ondansetron (ZOFRAN) IV, mouth rinse, polyethylene glycol, promethazine, sodium chloride flush   I have personally reviewed following labs and imaging studies  LABORATORY DATA: CBC: Recent Labs  Lab 03/29/22 2207 03/30/22 0349 03/31/22 0620 03/31/22 2150 04/01/22 0320 04/01/22 1052 04/01/22 1836 04/02/22 0309 04/03/22 0426  WBC 13.9*   < > 14.9*   < > 12.7* 15.2* 14.2* 14.0* 13.3*  NEUTROABS 11.3*  --  12.4*  --   --   --   --   --   --   HGB 6.8*   < > 10.4*   < > 11.6* 13.1 11.4* 11.5* 11.3*  HCT 19.7*   < > 29.9*   < > 34.2* 39.3 33.0* 34.9* 35.0*  MCV 87.9   < > 88.2   < > 88.4 88.7 87.3 89.5 90.7  PLT 224   < > 249   < > 312 382 360 379 413*   < > = values in this interval not displayed.    Basic Metabolic Panel: Recent Labs  Lab 03/30/22 0348 03/31/22 0620 03/31/22 2150 04/01/22 0320 04/02/22 0309 04/03/22 0426  NA 137 134* 141 142 137 140  K 5.3* 4.7 4.4 4.5 4.4 4.5  CL 111 109 109 111 106 110  CO2 14* 11* 17* 18* 16* 20*  GLUCOSE 113* 118* 105* 93 90 78  BUN 63* 53*  50* 49* 45* 40*  CREATININE 4.24* 3.93* 4.20* 4.17* 4.24* 4.09*  CALCIUM 8.5* 8.3* 8.5* 8.2* 8.5* 8.7*  MG  --   --  1.4*  --   --   --   PHOS 8.0* 7.1*  --  9.0* 6.8* 7.0*    GFR: Estimated Creatinine Clearance: 13.2 mL/min (A) (by C-G formula based on SCr of 4.09 mg/dL (H)).  Liver Function Tests: Recent Labs  Lab 03/30/22 0348 03/31/22 0620 04/01/22 0320 04/02/22 0309 04/03/22 0426  ALBUMIN <1.5* 1.8* 1.7* 1.8* 1.7*   No results for input(s): "LIPASE", "AMYLASE" in the last 168 hours. No results for input(s): "AMMONIA" in the last 168 hours.  Coagulation Profile: Recent Labs  Lab 03/29/22 1346 03/30/22 2215 03/31/22 0620  INR 1.3* 1.4* 1.4*    Cardiac Enzymes: No results for input(s): "CKTOTAL", "CKMB", "CKMBINDEX", "TROPONINI" in the last 168 hours.  BNP (last 3 results) No results for input(s): "PROBNP" in the last 8760 hours.  Lipid Profile: No results for input(s): "CHOL", "HDL", "LDLCALC", "TRIG", "CHOLHDL", "LDLDIRECT" in the last 72 hours.  Thyroid Function Tests: No results for input(s): "TSH", "T4TOTAL", "FREET4", "T3FREE", "THYROIDAB" in the last 72 hours.  Anemia Panel: No results for input(s): "VITAMINB12", "FOLATE", "FERRITIN", "TIBC", "IRON", "RETICCTPCT" in the last 72 hours.  Urine analysis:    Component Value Date/Time   COLORURINE YELLOW 03/21/2022 1422   APPEARANCEUR CLOUDY (A) 03/21/2022 1422   APPEARANCEUR Clear 12/25/2021 1409   LABSPEC 1.014 03/21/2022 1422   LABSPEC 1.010 04/20/2012 1725   PHURINE 7.0 03/21/2022 1422   GLUCOSEU 50 (A) 03/21/2022 1422   GLUCOSEU Negative 04/20/2012 1725   HGBUR MODERATE (A) 03/21/2022 1422   BILIRUBINUR NEGATIVE 03/21/2022 1422  BILIRUBINUR Negative 12/25/2021 1409   BILIRUBINUR Negative 04/20/2012 1725   KETONESUR NEGATIVE 03/21/2022 1422   PROTEINUR 100 (A) 03/21/2022 1422   NITRITE NEGATIVE 03/21/2022 1422   LEUKOCYTESUR SMALL (A) 03/21/2022 1422   LEUKOCYTESUR Negative 04/20/2012 1725     Sepsis Labs: Lactic Acid, Venous    Component Value Date/Time   LATICACIDVEN 1.8 03/10/2022 0049    MICROBIOLOGY: Recent Results (from the past 240 hour(s))  Culture, blood (Routine X 2) w Reflex to ID Panel     Status: None   Collection Time: 03/27/22 11:05 PM   Specimen: BLOOD LEFT ARM  Result Value Ref Range Status   Specimen Description BLOOD LEFT ARM  Final   Special Requests   Final    BOTTLES DRAWN AEROBIC AND ANAEROBIC Blood Culture adequate volume   Culture   Final    NO GROWTH 5 DAYS Performed at [facility], [address], [place], [place] [address]    Report Status 04/01/2022 FINAL  Final    RADIOLOGY STUDIES/RESULTS: No results found.   LOS: 24 days   Oren Binet, MD  [facility]    To contact the attending provider between 7A-7P or the covering provider during after hours 7P-7A, please log into the web site [email or web address] and access using universal [place] password for that web site. If you do not have the password, please call the hospital operator.  04/03/2022, 7:01 AM

## 2022-04-03 NOTE — Progress Notes (Addendum)
Daily Rounding Note  04/03/2022, 2:01 PM  LOS: 24 days   SUBJECTIVE:   Chief complaint:     4 loose brown, non-bloody stools yesterday, 2 today.  Ongoing nausea wo emesis.  On going abdominal tenderess  OBJECTIVE:         Vital signs in last 24 hours:    Temp:  [97 F (36.1 C)-99.5 F (37.5 C)] 99 F (37.2 C) (01/24 0500) Pulse Rate:  [107-118] 108 (01/24 0300) Resp:  [15-20] 15 (01/24 0500) BP: (140-157)/(79-99) 151/81 (01/24 0500) SpO2:  [96 %-98 %] 96 % (01/24 0500) Last BM Date : 04/03/22 Filed Weights   03/31/22 0452 04/01/22 0400 04/02/22 0500  Weight: 74.8 kg 73.9 kg 74.9 kg   General: This.  Chronically ill.  Anxious. Heart: RRR Chest: Raspy smokers quality voice.  No labored breathing. Abdomen: Obese, soft.  Tender to even light pressure without guarding or rebound.  Active bowel sounds.  Brown liquid stool pooling around her thighs, I hemocculted it at bedside and it is positive for blood.  Over there is no visible blood and stool does not appear melenic. Extremities: No CCE. Neuro/Psych: Anxious.  Appropriate.  Pressured speech.  Intake/Output from previous day: 01/23 0701 - 01/24 0700 In: 574.6 [I.V.:524.6; IV Piggyback:50] Out: 450 [Urine:450]  Intake/Output this shift: No intake/output data recorded.  Lab Results: Recent Labs    04/01/22 1836 04/02/22 0309 04/03/22 0426  WBC 14.2* 14.0* 13.3*  HGB 11.4* 11.5* 11.3*  HCT 33.0* 34.9* 35.0*  PLT 360 379 413*   BMET Recent Labs    04/01/22 0320 04/02/22 0309 04/03/22 0426  NA 142 137 140  K 4.5 4.4 4.5  CL 111 106 110  CO2 18* 16* 20*  GLUCOSE 93 90 78  BUN 49* 45* 40*  CREATININE 4.17* 4.24* 4.09*  CALCIUM 8.2* 8.5* 8.7*   LFT Recent Labs    04/01/22 0320 04/02/22 0309 04/03/22 0426  ALBUMIN 1.7* 1.8* 1.7*   PT/INR No results for input(s): "LABPROT", "INR" in the last 72 hours. Hepatitis Panel No results for input(s):  "HEPBSAG", "HCVAB", "HEPAIGM", "HEPBIGM" in the last 72 hours.  Studies/Results: No results found.  Scheduled Meds:  sodium chloride   Intravenous Once   amLODipine  10 mg Oral Daily   arformoterol  15 mcg Nebulization BID   budesonide (PULMICORT) nebulizer solution  0.5 mg Nebulization BID   Chlorhexidine Gluconate Cloth  6 each Topical Daily   darbepoetin (ARANESP) injection - DIALYSIS  40 mcg Subcutaneous Q Fri-1800   feeding supplement  1 Container Oral TID BM   insulin aspart  0-15 Units Subcutaneous Q4H   multivitamin  1 tablet Oral QHS   nortriptyline  50 mg Oral QHS   mouth rinse  15 mL Mouth Rinse 4 times per day   pantoprazole (PROTONIX) IV  40 mg Intravenous Q24H   QUEtiapine  300 mg Oral QHS   revefenacin  175 mcg Nebulization Daily   sodium chloride flush  10-40 mL Intracatheter Q12H   Vilazodone HCl  40 mg Oral Daily   Continuous Infusions:  sodium chloride 10 mL/hr at 04/02/22 1500   sodium chloride 10 mL (04/01/22 1039)   dextrose 5 % and 0.45% NaCl Stopped (03/31/22 0730)   lactated ringers     piperacillin-tazobactam (ZOSYN)  IV 2.25 g (04/03/22 0613)   PRN Meds:.sodium chloride, acetaminophen, albuterol, docusate, haloperidol lactate, labetalol, lip balm, mineral oil-hydrophilic petrolatum, ondansetron (ZOFRAN) IV, mouth rinse,  polyethylene glycol, promethazine, sodium chloride flush   ASSESMENT:       Hematochezia.  Colitis w ulceration.  Colonoscopy 1/20:  blood throughout colon, multiple ulcer.  Path not yet read.  Surgery following and no plans for surgery, very high risk.  Day 3 Zosyn, previous 7 d Rocephin. 1/20 EGD: Normal.  C. difficile and stool PCR path panel were negative on 12/30, 3 weeks ago.    Chronic nausea for years.  No emesis.  Protonix 40 IV bid in place.    AKI.  Required CRRT, then  HD, last session 1/15.  GFR 12.  Per nephrology note yesterday she is nonoliguric and no indications for resumption of dialysis.    Resp failure, sepsis.   Strep bacteremia.  Persistent bil infiltrates.       Blood loss anemia. 6 PRBCs thus far.  Hgb stable, 11.3 today.  Receiving Aranesp.     Polysubstance abuse.       Malnutrition risk.     PLAN   Phoned pathology just now.  Apparently new stains were ordered and have been completed, the slides are" final review".  Asked that the pathologist called me when the results are available and provided my cell phone number.  Addendum at 2:37 PM Pathology report shows scant fragments of granulation tissue.  Stains for CMV, EBV negative.  Carotene stain negative. No conclusive etiology for the colitis based on pathology.  Azucena Freed  04/03/2022, 2:01 PM Phone [phone]   Attending physician's note   I have taken a history, reviewed the chart and examined the patient. I performed a substantive portion of this encounter, including complete performance of at least one of the key components, in conjunction with the APP. I agree with the APP's note, impression and recommendations.    Follow-up in his room in stable, no further bleeding Nonspecific colitis with ulceration, possible ischemic colitis.  Biopsies negative for acute infection or inflammatory bowel disease Continue supportive care  GI will sign off, available if have any questions   The patient was provided an opportunity to ask questions and all were answered. The patient agreed with the plan and demonstrated an understanding of the instructions.   Damaris Hippo , MD [phone]

## 2022-04-03 NOTE — Progress Notes (Signed)
Occupational Therapy Treatment Patient Details Name: Darlene Barber MRN: [id number] DOB: [birth year] Today's Date: 04/03/2022   History of present illness 57 yo female admitted 12/30 to [facility] with abdominal pain and rash with sepsis and AKI. Respiratory failure intubated 1/2-1/11. CRRT (1/3-1/8, 1/10-1/11). pt also with metabolic encephalopathy acutely. PMhx: chronic pain, GERD, bipolar, tobacco and THC abuse, insomnia, HTN, tremor   OT comments  Pt progressing towards established OT goals. Focusing session on strengthening, participation in ADL, confidence building. Pt donning socks at bed level using figure 4 position with mod A to thread due to stomach pain with reaching forward or bring LE up. With encouragement and assurance of her safety, pt performing bed mobility with min guard A and STS transfer X2 with min A +2 (for safety only). Pt reports it would be helpful to have spiritual care consult. Case manager and RN notified and recommended within flowsheet. Continue to recommend SNF for continued OT services.     Recommendations for follow up therapy are one component of a multi-disciplinary discharge planning process, led by the attending physician.  Recommendations may be updated based on patient status, additional functional criteria and insurance authorization.    Follow Up Recommendations  Skilled nursing-short term rehab (<3 hours/day)     Assistance Recommended at Discharge Frequent or constant Supervision/Assistance  Patient can return home with the following  Two people to help with walking and/or transfers;Two people to help with bathing/dressing/bathroom;Assistance with cooking/housework;Assistance with feeding;Direct supervision/assist for medications management;Direct supervision/assist for financial management;Assist for transportation;Help with stairs or ramp for entrance   Equipment Recommendations  Other (comment) (defer)    Recommendations for Other Services       Precautions / Restrictions Precautions Precautions: Fall;Other (comment) Restrictions Weight Bearing Restrictions: No       Mobility Bed Mobility Overal bed mobility: Needs Assistance Bed Mobility: Supine to Sit, Sit to Sidelying, Rolling Rolling: Min guard     Sit to supine: Min guard Sit to sidelying: Min guard General bed mobility comments: Min guard A for safety; initially needing cues for sequencing to EOB    Transfers Overall transfer level: Needs assistance Equipment used: 2 person hand held assist Transfers: Sit to/from Stand Sit to Stand: Min assist, +2 safety/equipment           General transfer comment: Pt very anxious, requiring encouragement and assurance that she has adequate support for safety. +2 present for pt confidence only. Min hand held assist with pt grasping therapist and RN arms.     Balance Overall balance assessment: Needs assistance Sitting-balance support: No upper extremity supported, Feet unsupported Sitting balance-Leahy Scale: Fair Sitting balance - Comments: supervision sitting EOB; provided min guard (stayed close by) for pt comfort   Standing balance support: Bilateral upper extremity supported, During functional activity Standing balance-Leahy Scale: Poor Standing balance comment: reliant on BUE support                           ADL either performed or assessed with clinical judgement   ADL Overall ADL's : Needs assistance/impaired     Grooming: Wash/dry face;Set up;Sitting Grooming Details (indicate cue type and reason): supervision for EOB             Lower Body Dressing: Moderate assistance;Bed level Lower Body Dressing Details (indicate cue type and reason): Donning socks in bed. Mod A to begin threadding sock Toilet Transfer: Minimal assistance;+2 for safety/equipment Toilet Transfer Details (indicate cue type and reason):  STS with min A; +2 present for pt comfort as pt very anxious with OOB mobility          Functional mobility during ADLs: Minimal assistance;+2 for safety/equipment General ADL Comments: Deferring taking steps this date due to nausea    Extremity/Trunk Assessment Upper Extremity Assessment Upper Extremity Assessment: Generalized weakness   Lower Extremity Assessment Lower Extremity Assessment: Defer to PT evaluation        Vision   Vision Assessment?: Vision impaired- to be further tested in functional context Additional Comments: Pt reports decreased acuity; needed to don glasses to locate opening of sock and cues to orient, but did not on second sock. Continue to assess   Perception     Praxis      Cognition Arousal/Alertness: Awake/alert Behavior During Therapy: Flat affect (emotionally labile) Overall Cognitive Status: No family/caregiver present to determine baseline cognitive functioning                                 General Comments: Following commands with increased time, light encouragement, and multimodal cues. Observed to be anxious with all movement and easy to startle. Slow processing noted throughout. Increased encouragemnet for OOB mobility        Exercises      Shoulder Instructions       General Comments VSS    Pertinent Vitals/ Pain       Pain Assessment Pain Assessment: Faces Faces Pain Scale: Hurts even more Pain Location: stomach Pain Descriptors / Indicators: Sore Pain Intervention(s): Limited activity within patient's tolerance, Monitored during session  Home Living                                          Prior Functioning/Environment              Frequency  Min 2X/week        Progress Toward Goals  OT Goals(current goals can now be found in the care plan section)  Progress towards OT goals: Progressing toward goals  Acute Rehab OT Goals Patient Stated Goal: continue working towards goals with encouragement OT Goal Formulation: With patient Time For Goal Achievement:  04/11/22 Potential to Achieve Goals: Good  Plan Discharge plan remains appropriate;Frequency remains appropriate    Co-evaluation                 AM-PAC OT "6 Clicks" Daily Activity     Outcome Measure   Help from another person eating meals?: A Lot Help from another person taking care of personal grooming?: A Little Help from another person toileting, which includes using toliet, bedpan, or urinal?: A Lot Help from another person bathing (including washing, rinsing, drying)?: A Lot Help from another person to put on and taking off regular upper body clothing?: A Little Help from another person to put on and taking off regular lower body clothing?: A Lot 6 Click Score: 14    End of Session Equipment Utilized During Treatment: Gait belt  OT Visit Diagnosis: History of falling (Z91.81);Muscle weakness (generalized) (M62.81);Repeated falls (R29.6);Other symptoms and signs involving cognitive function;Pain Pain - part of body:  (stomach)   Activity Tolerance Patient tolerated treatment well   Patient Left in bed;with call bell/phone within reach;with bed alarm set;with nursing/sitter in room;with family/visitor present   Nurse Communication Mobility status (RN present for  session)        Time: 4982-6415 OT Time Calculation (min): 39 min  Charges: OT General Charges $OT Visit: 1 Visit OT Treatments $Self Care/Home Management : 38-52 mins  Elder Cyphers, OTR/L [facility] Acute Rehabilitation Office: [phone]   Magnus Ivan 04/03/2022, 5:37 PM

## 2022-04-03 NOTE — Progress Notes (Signed)
Patient ID: Darlene Barber, female   DOB: 30-Apr-1965, 57 y.o.   MRN: [id number] S: Patient seen and examined bedside, out of ICU. She is complaining about the grape juice and how bad it tastes. Uop ~0.4L with 3 unmeasured voids O:BP (!) 151/81 (BP Location: Right Leg)   Pulse (!) 108   Temp 99 F (37.2 C) (Oral)   Resp 15   Ht 4\' 10"  (1.473 m)   Wt 74.9 kg   SpO2 96%   BMI 34.51 kg/m   Intake/Output Summary (Last 24 hours) at 04/03/2022 1202 Last data filed at 04/03/2022 0500 Gross per 24 hour  Intake 149.35 ml  Output 450 ml  Net -300.65 ml   Intake/Output: I/O last 3 completed shifts: In: 2468.4 [P.O.:690; I.V.:1628.4; IV Piggyback:150] Out: 1250 [Urine:1250]  Intake/Output this shift:  No intake/output data recorded. Weight change:  Gen: NAD CVS: RRR Resp: CTA, normal WOB Abd: +BS, soft, NT/ND Ext: no edema Neuro: awake, alert  Recent Labs  Lab 03/28/22 0530 03/29/22 0255 03/29/22 2207 03/30/22 0348 03/31/22 0620 03/31/22 2150 04/01/22 0320 04/02/22 0309 04/03/22 0426  NA 136 136 134* 137 134* 141 142 137 140  K 4.2 4.6 4.8 5.3* 4.7 4.4 4.5 4.4 4.5  CL 104 107 109 111 109 109 111 106 110  CO2 20* 17* 14* 14* 11* 17* 18* 16* 20*  GLUCOSE 116* 157* 120* 113* 118* 105* 93 90 78  BUN 74* 72* 63* 63* 53* 50* 49* 45* 40*  CREATININE 4.17* 4.19* 4.00* 4.24* 3.93* 4.20* 4.17* 4.24* 4.09*  ALBUMIN 1.9* 1.9*  --  <1.5* 1.8*  --  1.7* 1.8* 1.7*  CALCIUM 8.7* 8.9 8.2* 8.5* 8.3* 8.5* 8.2* 8.5* 8.7*  PHOS 5.9* 6.8*  --  8.0* 7.1*  --  9.0* 6.8* 7.0*   Liver Function Tests: Recent Labs  Lab 04/01/22 0320 04/02/22 0309 04/03/22 0426  ALBUMIN 1.7* 1.8* 1.7*   No results for input(s): "LIPASE", "AMYLASE" in the last 168 hours. No results for input(s): "AMMONIA" in the last 168 hours. CBC: Recent Labs  Lab 03/29/22 2207 03/30/22 0349 03/31/22 0620 03/31/22 2150 04/01/22 0320 04/01/22 1052 04/01/22 1836 04/02/22 0309 04/03/22 0426  WBC 13.9*   < > 14.9*    < > 12.7* 15.2* 14.2* 14.0* 13.3*  NEUTROABS 11.3*  --  12.4*  --   --   --   --   --   --   HGB 6.8*   < > 10.4*   < > 11.6* 13.1 11.4* 11.5* 11.3*  HCT 19.7*   < > 29.9*   < > 34.2* 39.3 33.0* 34.9* 35.0*  MCV 87.9   < > 88.2   < > 88.4 88.7 87.3 89.5 90.7  PLT 224   < > 249   < > 312 382 360 379 413*   < > = values in this interval not displayed.   Cardiac Enzymes: No results for input(s): "CKTOTAL", "CKMB", "CKMBINDEX", "TROPONINI" in the last 168 hours. CBG: Recent Labs  Lab 04/02/22 2107 04/03/22 0028 04/03/22 0456 04/03/22 0850 04/03/22 1120  GLUCAP 94 133* 81 92 114*    Iron Studies: No results for input(s): "IRON", "TIBC", "TRANSFERRIN", "FERRITIN" in the last 72 hours. Studies/Results: No results found.  sodium chloride   Intravenous Once   amLODipine  10 mg Oral Daily   arformoterol  15 mcg Nebulization BID   budesonide (PULMICORT) nebulizer solution  0.5 mg Nebulization BID   Chlorhexidine Gluconate Cloth  6 each Topical  Daily   darbepoetin (ARANESP) injection - DIALYSIS  40 mcg Subcutaneous Q Fri-1800   feeding supplement  1 Container Oral TID BM   insulin aspart  0-15 Units Subcutaneous Q4H   multivitamin  1 tablet Oral QHS   nortriptyline  50 mg Oral QHS   mouth rinse  15 mL Mouth Rinse 4 times per day   pantoprazole (PROTONIX) IV  40 mg Intravenous Q24H   QUEtiapine  300 mg Oral QHS   revefenacin  175 mcg Nebulization Daily   sodium chloride flush  10-40 mL Intracatheter Q12H   Vilazodone HCl  40 mg Oral Daily    BMET    Component Value Date/Time   NA 140 04/03/2022 0426   NA 137 04/20/2012 1725   K 4.5 04/03/2022 0426   K 4.2 04/20/2012 1725   CL 110 04/03/2022 0426   CL 106 04/20/2012 1725   CO2 20 (L) 04/03/2022 0426   CO2 24 04/20/2012 1725   GLUCOSE 78 04/03/2022 0426   GLUCOSE 97 04/20/2012 1725   BUN 40 (H) 04/03/2022 0426   BUN 14 04/20/2012 1725   CREATININE 4.09 (H) 04/03/2022 0426   CREATININE 0.83 09/30/2019 1057   CALCIUM 8.7 (L)  04/03/2022 0426   CALCIUM 9.2 04/20/2012 1725   GFRNONAA 12 (L) 04/03/2022 0426   GFRNONAA 81 09/30/2019 1057   GFRAA 93 09/30/2019 1057   CBC    Component Value Date/Time   WBC 13.3 (H) 04/03/2022 0426   RBC 3.86 (L) 04/03/2022 0426   HGB 11.3 (L) 04/03/2022 0426   HGB 16.7 (H) 04/20/2012 1725   HCT 35.0 (L) 04/03/2022 0426   HCT 50.0 (H) 04/20/2012 1725   PLT 413 (H) 04/03/2022 0426   PLT 331 04/20/2012 1725   MCV 90.7 04/03/2022 0426   MCV 98 04/20/2012 1725   MCH 29.3 04/03/2022 0426   MCHC 32.3 04/03/2022 0426   RDW 17.2 (H) 04/03/2022 0426   RDW 14.2 04/20/2012 1725   LYMPHSABS 1.3 03/31/2022 0620   LYMPHSABS 2.7 08/27/2011 0558   MONOABS 0.3 03/31/2022 0620   MONOABS 0.5 08/27/2011 0558   EOSABS 0.4 03/31/2022 0620   EOSABS 0.5 08/27/2011 0558   BASOSABS 0.0 03/31/2022 0620   BASOSABS 0.1 08/27/2011 0558    Assessment/ Plan: AKI - crt of 0.8 in August of 2023, bland UA in October of 23.  Now with AKI , proteinuria and hematuria but also pyuria.  Seems most likely to be AKI from ATN in the setting of bacteremia. Given the proteinuria and hematuria concerning for GN, some serologies were sent and were generally negative (see above). 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 SP CRRT 1/02- 1/11. Tx'd to [facility] 1/11. Her last HD session was 03/25/22 and right femoral HD catheter removed 03/26/22. Unfortunately she had another CT angio 1/21 so will need to continue to follow UOP and daily labs.  Will likely require colectomy which could further complicate her renal recovery but this does not seem to be the case at this time. Cr relatively stable today at 4 (has been stable in the 4's since d/c'ing HD).  Remains nonoliguric. Will c/w gentle IVF given poor PO intake: LR 75cc/hr x 1 day No indication to resume IHD at this time Will follow response and decide if she will need a tunneled catheter and ongoing HD.   ABLA with evidence  of acute GI bleed - pt with hematochezia last night and this  morning.  Receiving blood transfusion.  Unfortunately colonoscopy and CT angio x 2 could not isolate location of GIB, now pointing to diffuse colonic bleeding due to diffuse ulcerations and colitis.  Surgery consulted for possible total colectomy however very high risk therefore not a surgical candidate Hyperkalemia - resolved Volume - persistent infiltrates on CXR are likely infection-related.  HTN - BP's have improved.  Back on scheduled meds.  Normocytic Anemia  - deferring IV iron in setting of infection.  Aranesp 40 mcg every Thursday.  Gi following for ABLA. Hgb stable at 11.5 today Strep bacteremia-  streptococcus infantarius, TEE negative, getting Rocephin thru 1/17. Colonoscopy when more stable per pmd.  Acute hypoxic respiratory failure - extubated on 1/10, on high level [place] O2 now. Repeat CXR's showed persistent bilat infiltrates as above H/o polysubstance abuse  Gean Quint, MD [facility]

## 2022-04-03 NOTE — Progress Notes (Signed)
[facility] Progress Note  4 Days Post-Op  Subjective: CC-  Feels the same as yesterday. Continues to have some diffuse intermittent abdominal pain and nausea. No emesis yesterday. She did have a medium brown stool yesterday evening, and a black with red streaks BM this morning. Hgb stable at 11.3  Objective: Vital signs in last 24 hours: Temp:  [97 F (36.1 C)-99.5 F (37.5 C)] 99 F (37.2 C) (01/24 0500) Pulse Rate:  [107-119] 108 (01/24 0300) Resp:  [14-20] 15 (01/24 0500) BP: (140-168)/(79-115) 151/81 (01/24 0500) SpO2:  [96 %-99 %] 96 % (01/24 0500) Last BM Date : 04/03/22  Intake/Output from previous day: 01/23 0701 - 01/24 0700 In: 574.6 [I.V.:524.6; IV Piggyback:50] Out: 450 [Urine:450] Intake/Output this shift: No intake/output data recorded.  PE: General: Alert, NAD Heart: tachycardic low 100s Lungs: Respiratory effort nonlabored Abd: soft, generalized ttp without peritonitis, mild distention  Lab Results:  Recent Labs    04/02/22 0309 04/03/22 0426  WBC 14.0* 13.3*  HGB 11.5* 11.3*  HCT 34.9* 35.0*  PLT 379 413*   BMET Recent Labs    04/02/22 0309 04/03/22 0426  NA 137 140  K 4.4 4.5  CL 106 110  CO2 16* 20*  GLUCOSE 90 78  BUN 45* 40*  CREATININE 4.24* 4.09*  CALCIUM 8.5* 8.7*   PT/INR No results for input(s): "LABPROT", "INR" in the last 72 hours. CMP     Component Value Date/Time   NA 140 04/03/2022 0426   NA 137 04/20/2012 1725   K 4.5 04/03/2022 0426   K 4.2 04/20/2012 1725   CL 110 04/03/2022 0426   CL 106 04/20/2012 1725   CO2 20 (L) 04/03/2022 0426   CO2 24 04/20/2012 1725   GLUCOSE 78 04/03/2022 0426   GLUCOSE 97 04/20/2012 1725   BUN 40 (H) 04/03/2022 0426   BUN 14 04/20/2012 1725   CREATININE 4.09 (H) 04/03/2022 0426   CREATININE 0.83 09/30/2019 1057   CALCIUM 8.7 (L) 04/03/2022 0426   CALCIUM 9.2 04/20/2012 1725   PROT 5.6 (L) 03/26/2022 0734   PROT 7.9 04/20/2012 1725   ALBUMIN 1.7 (L) 04/03/2022  0426   ALBUMIN 4.0 04/20/2012 1725   AST 24 03/26/2022 0734   AST 26 04/20/2012 1725   ALT 27 03/26/2022 0734   ALT 39 04/20/2012 1725   ALKPHOS 94 03/26/2022 0734   ALKPHOS 123 04/20/2012 1725   BILITOT 0.4 03/26/2022 0734   BILITOT 0.5 04/20/2012 1725   GFRNONAA 12 (L) 04/03/2022 0426   GFRNONAA 81 09/30/2019 1057   GFRAA 93 09/30/2019 1057   Lipase     Component Value Date/Time   LIPASE 88 (H) 03/09/2022 2204       Studies/Results: No results found.  Anti-infectives: Anti-infectives (From admission, onward)    Start     Dose/Rate Route Frequency Ordered Stop   03/31/22 0600  piperacillin-tazobactam (ZOSYN) IVPB 2.25 g       See Hyperspace for full Linked Orders Report.   2.25 g 100 mL/hr over 30 Minutes Intravenous Every 8 hours 03/30/22 1801 04/05/22 2359   03/30/22 1845  piperacillin-tazobactam (ZOSYN) IVPB 3.375 g       See Hyperspace for full Linked Orders Report.   3.375 g 100 mL/hr over 30 Minutes Intravenous  Once 03/30/22 1801 03/30/22 1850   03/21/22 1000  cefTRIAXone (ROCEPHIN) 2 g in sodium chloride 0.9 % 100 mL IVPB        2 g 200 mL/hr over 30 Minutes Intravenous  Every 24 hours 03/21/22 0740 03/28/22 0326   03/20/22 1200  piperacillin-tazobactam (ZOSYN) IVPB 3.375 g  Status:  Discontinued        3.375 g 12.5 mL/hr over 240 Minutes Intravenous Every 8 hours 03/20/22 0921 03/21/22 0740   03/19/22 1200  piperacillin-tazobactam (ZOSYN) IVPB 2.25 g  Status:  Discontinued        2.25 g 100 mL/hr over 30 Minutes Intravenous Every 8 hours 03/19/22 0931 03/20/22 0921   03/18/22 2000  piperacillin-tazobactam (ZOSYN) IVPB 3.375 g  Status:  Discontinued        3.375 g 12.5 mL/hr over 240 Minutes Intravenous Every 8 hours 03/18/22 1355 03/19/22 0931   03/16/22 1015  piperacillin-tazobactam (ZOSYN) IVPB 3.375 g  Status:  Discontinued        3.375 g 100 mL/hr over 30 Minutes Intravenous Every 6 hours 03/16/22 1007 03/18/22 1355   03/16/22 1000   piperacillin-tazobactam (ZOSYN) IVPB 3.375 g  Status:  Discontinued        3.375 g 12.5 mL/hr over 240 Minutes Intravenous Every 8 hours 03/16/22 0949 03/16/22 1007   03/12/22 1800  ampicillin (OMNIPEN) 2 g in sodium chloride 0.9 % 100 mL IVPB  Status:  Discontinued        2 g 300 mL/hr over 20 Minutes Intravenous Every 6 hours 03/12/22 1629 03/16/22 0919   03/12/22 1200  ampicillin (OMNIPEN) 2 g in sodium chloride 0.9 % 100 mL IVPB  Status:  Discontinued        2 g 300 mL/hr over 20 Minutes Intravenous 2 times daily 03/12/22 0945 03/12/22 1629   03/12/22 0600  vancomycin (VANCOREADY) IVPB 500 mg/100 mL  Status:  Discontinued        500 mg 100 mL/hr over 60 Minutes Intravenous Every 48 hours 03/11/22 2256 03/11/22 2307   03/11/22 2344  vancomycin variable dose per unstable renal function (pharmacist dosing)  Status:  Discontinued         Does not apply See admin instructions 03/11/22 2344 03/12/22 0945   03/11/22 2315  vancomycin (VANCOREADY) IVPB 1750 mg/350 mL  Status:  Discontinued        1,750 mg 175 mL/hr over 120 Minutes Intravenous  Once 03/11/22 2220 03/11/22 2250   03/10/22 1200  cefTRIAXone (ROCEPHIN) 2 g in sodium chloride 0.9 % 100 mL IVPB  Status:  Discontinued        2 g 200 mL/hr over 30 Minutes Intravenous Every 24 hours 03/10/22 0934 03/12/22 0945   03/10/22 0645  vancomycin (VANCOREADY) IVPB 1500 mg/300 mL        1,500 mg 150 mL/hr over 120 Minutes Intravenous  Once 03/10/22 0547 03/10/22 0848   03/10/22 0555  vancomycin variable dose per unstable renal function (pharmacist dosing)  Status:  Discontinued         Does not apply See admin instructions 03/10/22 0555 03/10/22 0931   03/10/22 0400  ceFEPIme (MAXIPIME) 2 g in sodium chloride 0.9 % 100 mL IVPB  Status:  Discontinued        2 g 200 mL/hr over 30 Minutes Intravenous Every 24 hours 03/10/22 0303 03/10/22 0931   03/10/22 0345  metroNIDAZOLE (FLAGYL) IVPB 500 mg  Status:  Discontinued        500 mg 100 mL/hr over  60 Minutes Intravenous Every 12 hours 03/10/22 0246 03/10/22 0931        Assessment/Plan GI bleeding secondary to severe gastroenteritis/colitis with ulcerations - s/p colonoscopy, bx pending - s/p transfusion 6 units  from 1/20-1/21, hgb still stable at 11.3 this AM - CTA 1/21 without localized bleeding  - surgery would be last line for this patient without localized source of bleeding, TAC with risk of continued bleeding if ulcerations in small bowel continue to bleed. TAC would also result in ileostomy with high risk of dehydration from this. Recommend continued supportive care for now and no surgery unless no other options for medical management. We will follow    FEN: CLD, IVF - defer diet advancement to primary   VTE: SCDs, no chemical prophylaxis in setting of GI bleed ID: [email or web address]   I reviewed last 24 h vitals and pain scores, last 48 h intake and output, and last 24 h labs and trends.    LOS: 24 days    [facility] 04/03/2022, 10:09 AM Please see Amion for pager number during day hours 7:00am-4:30pm

## 2022-04-03 NOTE — Progress Notes (Signed)
Patient transferred to 5W. CSW will follow.  Gilmore Laroche, MSW, [facility]

## 2022-04-03 NOTE — Progress Notes (Signed)
Speech Language Pathology Treatment: Dysphagia  Patient Details Name: Darlene Barber MRN: [id number] DOB: 21-May-1965 Today's Date: 04/03/2022 Time: 1140-1150 SLP Time Calculation (min) (ACUTE ONLY): 10 min  Assessment / Plan / Recommendation Clinical Impression  Patient seen by SLP for skilled treatment focused on dysphagia goals. Patient in bed with significant other in room. Both reporting that she is improving but still with some nausea and abdominal discomfort. She was agreeable to taking sips of ginger ale, exhibiting timely swallow initiation and no overt s/s of dysphagia. Patient asking about crackers but also that she does not want to advance fully to solids. SLP recommending further PO advancement based on MD recommendations as she is not currently presenting with clinical s/s of dysphagia. SLP to s/o at this time.   HPI HPI: Pt is a 57 year old female admitted with  abdominal pain, diarrhea.  Found to have Streptococcus Infantarius Bacteremia. Ultimately needed intubation, CRRT, pressors.  CT chest abdomen pelvis with fluid throughout the stomach, small bowel and colon, nonspecific but may represent diarrheal state.  No bowel obstruction, definite bowel wall thickening or pneumoperitoneum.  Fluid within the esophagus may represent reflux or nonspecific esophageal motility disorder. Intubated from 12/31 from 1/11, precedex weaned on 1/14.      SLP Plan  All goals met      Recommendations for follow up therapy are one component of a multi-disciplinary discharge planning process, led by the attending physician.  Recommendations may be updated based on patient status, additional functional criteria and insurance authorization.    Recommendations  Diet recommendations: Other(comment) (diet advanced as tolerated as per GI and MD recommendations) Liquids provided via: Cup;Straw Medication Administration: Whole meds with liquid Supervision: Staff to assist with self feeding;Intermittent  supervision to cue for compensatory strategies Compensations: Slow rate;Small sips/bites Postural Changes and/or Swallow Maneuvers: Seated upright 90 degrees;Upright 30-60 min after meal                Oral Care Recommendations: Oral care BID Follow Up Recommendations: No SLP follow up Assistance recommended at discharge: None SLP Visit Diagnosis: Dysphagia, oropharyngeal phase (R13.12) Plan: All goals met           Sonia Baller, MA, CCC-SLP Speech Therapy

## 2022-04-04 DIAGNOSIS — G9340 Encephalopathy, unspecified: Secondary | ICD-10-CM | POA: Diagnosis not present

## 2022-04-04 DIAGNOSIS — A419 Sepsis, unspecified organism: Secondary | ICD-10-CM | POA: Diagnosis not present

## 2022-04-04 DIAGNOSIS — R7881 Bacteremia: Secondary | ICD-10-CM | POA: Diagnosis not present

## 2022-04-04 DIAGNOSIS — D62 Acute posthemorrhagic anemia: Secondary | ICD-10-CM | POA: Diagnosis not present

## 2022-04-04 LAB — RENAL FUNCTION PANEL
Albumin: 1.8 g/dL — ABNORMAL LOW (ref 3.5–5.0)
Anion gap: 9 (ref 5–15)
BUN: 35 mg/dL — ABNORMAL HIGH (ref 6–20)
CO2: 20 mmol/L — ABNORMAL LOW (ref 22–32)
Calcium: 8.5 mg/dL — ABNORMAL LOW (ref 8.9–10.3)
Chloride: 108 mmol/L (ref 98–111)
Creatinine, Ser: 4.2 mg/dL — ABNORMAL HIGH (ref 0.44–1.00)
GFR, Estimated: 12 mL/min — ABNORMAL LOW (ref >60)
Glucose, Bld: 92 mg/dL (ref 70–99)
Phosphorus: 6.3 mg/dL — ABNORMAL HIGH (ref 2.5–4.6)
Potassium: 4.2 mmol/L (ref 3.5–5.1)
Sodium: 137 mmol/L (ref 135–145)

## 2022-04-04 LAB — GLUCOSE, CAPILLARY
Glucose-Capillary: 102 mg/dL — ABNORMAL HIGH (ref 70–99)
Glucose-Capillary: 114 mg/dL — ABNORMAL HIGH (ref 70–99)
Glucose-Capillary: 126 mg/dL — ABNORMAL HIGH (ref 70–99)
Glucose-Capillary: 141 mg/dL — ABNORMAL HIGH (ref 70–99)
Glucose-Capillary: 68 mg/dL — ABNORMAL LOW (ref 70–99)
Glucose-Capillary: 90 mg/dL (ref 70–99)
Glucose-Capillary: 93 mg/dL (ref 70–99)
Glucose-Capillary: 99 mg/dL (ref 70–99)

## 2022-04-04 MED ORDER — LACTATED RINGERS IV SOLN: INTRAVENOUS | Status: AC

## 2022-04-04 NOTE — Progress Notes (Signed)
After meeting with patient, CSW removed patient's significant other from the [place] (Darlene Barber [phone]). She does not want him to have any info on her or visit her at the hospital. Dewayne Shorter present for conversation.   Gilmore Laroche, MSW, [facility]

## 2022-04-04 NOTE — Inpatient Diabetes Management (Signed)
Inpatient Diabetes Program Recommendations  AACE/ADA: New Consensus Statement on Inpatient Glycemic Control (2015)  Target Ranges:  Prepandial:   less than 140 mg/dL      Peak postprandial:   less than 180 mg/dL (1-2 hours)      Critically ill patients:  140 - 180 mg/dL   Lab Results  Component Value Date   GLUCAP 68 (L) 04/04/2022   HGBA1C 5.6 03/10/2022    Latest Reference Range & Units 04/03/22 23:35 04/04/22 00:56 04/04/22 04:29 04/04/22 08:24 04/04/22 13:14  Glucose-Capillary 70 - 99 mg/dL 87 126 (H) 99 141 (H) 68 (L)  (H): Data is abnormally high (L): Data is abnormally low Review of Glycemic Control  Diabetes history: type 2 Outpatient Diabetes medications: Bydureon 2 mg weekly Current orders for Inpatient glycemic control: Novolog 0-15 units correction scale every 4 hours  Inpatient Diabetes Program Recommendations:   Noted that patient's CBGs have been less than 70 mg/dl.   If blood sugars continue to be less than 80 mg/dl, recommend changing Novolog correction scale to SENSITIVE (0-9 units) TID when eating.   Harvel Ricks RN BSN CDE Diabetes Coordinator Pager: [phone]  8am-5pm

## 2022-04-04 NOTE — Progress Notes (Signed)
Pt refusing neb treatments this morning.  She states she is upset with boyfriend and nebs make her nervous.

## 2022-04-04 NOTE — Progress Notes (Signed)
Patient ID: Darlene Barber, female   DOB: 1966-02-14, 57 y.o.   MRN: [id number] S: Patient seen and examined bedside, no complaints, no acute events. Son at bedside O:BP (!) 154/107 (BP Location: Left Arm)   Pulse (!) 105   Temp 98.5 F (36.9 C) (Axillary)   Resp 17   Ht 4\' 10"  (1.473 m)   Wt 74.9 kg   SpO2 99%   BMI 34.51 kg/m  No intake or output data in the 24 hours ending 04/04/22 1327  Intake/Output: I/O last 3 completed shifts: In: -  Out: 1250 [Urine:1250]  Intake/Output this shift:  No intake/output data recorded. Weight change:  Gen: NAD CVS: RRR Resp: CTA, normal WOB Abd: +BS, soft, NT/ND Ext: no edema Neuro: awake, alert  Recent Labs  Lab 03/29/22 0255 03/29/22 2207 03/30/22 0348 03/31/22 0620 03/31/22 2150 04/01/22 0320 04/02/22 0309 04/03/22 0426 04/04/22 0358  NA 136   < > 137 134* 141 142 137 140 137  K 4.6   < > 5.3* 4.7 4.4 4.5 4.4 4.5 4.2  CL 107   < > 111 109 109 111 106 110 108  CO2 17*   < > 14* 11* 17* 18* 16* 20* 20*  GLUCOSE 157*   < > 113* 118* 105* 93 90 78 92  BUN 72*   < > 63* 53* 50* 49* 45* 40* 35*  CREATININE 4.19*   < > 4.24* 3.93* 4.20* 4.17* 4.24* 4.09* 4.20*  ALBUMIN 1.9*  --  <1.5* 1.8*  --  1.7* 1.8* 1.7* 1.8*  CALCIUM 8.9   < > 8.5* 8.3* 8.5* 8.2* 8.5* 8.7* 8.5*  PHOS 6.8*  --  8.0* 7.1*  --  9.0* 6.8* 7.0* 6.3*   < > = values in this interval not displayed.   Liver Function Tests: Recent Labs  Lab 04/02/22 0309 04/03/22 0426 04/04/22 0358  ALBUMIN 1.8* 1.7* 1.8*   No results for input(s): "LIPASE", "AMYLASE" in the last 168 hours. No results for input(s): "AMMONIA" in the last 168 hours. CBC: Recent Labs  Lab 03/29/22 2207 03/30/22 0349 03/31/22 0620 03/31/22 2150 04/01/22 0320 04/01/22 1052 04/01/22 1836 04/02/22 0309 04/03/22 0426  WBC 13.9*   < > 14.9*   < > 12.7* 15.2* 14.2* 14.0* 13.3*  NEUTROABS 11.3*  --  12.4*  --   --   --   --   --   --   HGB 6.8*   < > 10.4*   < > 11.6* 13.1 11.4* 11.5* 11.3*   HCT 19.7*   < > 29.9*   < > 34.2* 39.3 33.0* 34.9* 35.0*  MCV 87.9   < > 88.2   < > 88.4 88.7 87.3 89.5 90.7  PLT 224   < > 249   < > 312 382 360 379 413*   < > = values in this interval not displayed.   Cardiac Enzymes: No results for input(s): "CKTOTAL", "CKMB", "CKMBINDEX", "TROPONINI" in the last 168 hours. CBG: Recent Labs  Lab 04/03/22 2335 04/04/22 0056 04/04/22 0429 04/04/22 0824 04/04/22 1314  GLUCAP 87 126* 99 141* 68*    Iron Studies: No results for input(s): "IRON", "TIBC", "TRANSFERRIN", "FERRITIN" in the last 72 hours. Studies/Results: No results found.  sodium chloride   Intravenous Once   amLODipine  10 mg Oral Daily   arformoterol  15 mcg Nebulization BID   budesonide (PULMICORT) nebulizer solution  0.5 mg Nebulization BID   Chlorhexidine Gluconate Cloth  6 each Topical Daily  darbepoetin (ARANESP) injection - DIALYSIS  40 mcg Subcutaneous Q Fri-1800   feeding supplement  1 Container Oral TID BM   insulin aspart  0-15 Units Subcutaneous Q4H   multivitamin  1 tablet Oral QHS   nortriptyline  50 mg Oral QHS   mouth rinse  15 mL Mouth Rinse 4 times per day   pantoprazole (PROTONIX) IV  40 mg Intravenous Q24H   QUEtiapine  300 mg Oral QHS   revefenacin  175 mcg Nebulization Daily   sodium chloride flush  10-40 mL Intracatheter Q12H   Vilazodone HCl  40 mg Oral Daily    BMET    Component Value Date/Time   NA 137 04/04/2022 0358   NA 137 04/20/2012 1725   K 4.2 04/04/2022 0358   K 4.2 04/20/2012 1725   CL 108 04/04/2022 0358   CL 106 04/20/2012 1725   CO2 20 (L) 04/04/2022 0358   CO2 24 04/20/2012 1725   GLUCOSE 92 04/04/2022 0358   GLUCOSE 97 04/20/2012 1725   BUN 35 (H) 04/04/2022 0358   BUN 14 04/20/2012 1725   CREATININE 4.20 (H) 04/04/2022 0358   CREATININE 0.83 09/30/2019 1057   CALCIUM 8.5 (L) 04/04/2022 0358   CALCIUM 9.2 04/20/2012 1725   GFRNONAA 12 (L) 04/04/2022 0358   GFRNONAA 81 09/30/2019 1057   GFRAA 93 09/30/2019 1057    CBC    Component Value Date/Time   WBC 13.3 (H) 04/03/2022 0426   RBC 3.86 (L) 04/03/2022 0426   HGB 11.3 (L) 04/03/2022 0426   HGB 16.7 (H) 04/20/2012 1725   HCT 35.0 (L) 04/03/2022 0426   HCT 50.0 (H) 04/20/2012 1725   PLT 413 (H) 04/03/2022 0426   PLT 331 04/20/2012 1725   MCV 90.7 04/03/2022 0426   MCV 98 04/20/2012 1725   MCH 29.3 04/03/2022 0426   MCHC 32.3 04/03/2022 0426   RDW 17.2 (H) 04/03/2022 0426   RDW 14.2 04/20/2012 1725   LYMPHSABS 1.3 03/31/2022 0620   LYMPHSABS 2.7 08/27/2011 0558   MONOABS 0.3 03/31/2022 0620   MONOABS 0.5 08/27/2011 0558   EOSABS 0.4 03/31/2022 0620   EOSABS 0.5 08/27/2011 0558   BASOSABS 0.0 03/31/2022 0620   BASOSABS 0.1 08/27/2011 0558    Assessment/ Plan: AKI - crt of 0.8 in August of 2023, bland UA in October of 23.  Now with AKI , proteinuria and hematuria but also pyuria.  Seems most likely to be AKI from ATN in the setting of bacteremia. Given the proteinuria and hematuria concerning for GN, some serologies were sent and were generally negative. 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 SP CRRT 1/02- 1/11. Tx'd to [facility] 1/11. Her last HD session was 03/25/22 and right femoral HD catheter removed 03/26/22. Unfortunately she had another CT angio 1/21 so will need to continue to follow UOP and daily labs.  Will likely require colectomy which could have further complicated her renal recovery however not a surgical candidate. Cr relatively stable today at 4.2 (has been stable in the 4's since d/c'ing HD).  Remains nonoliguric, no uremic symptoms, euvolemic on exam. Will c/w gentle IVF given poor PO intake: LR 75cc/hr (1 day at a time) No indication to resume IHD at this time. I am hoping that her kidney function will turn around with time Will follow response and decide if she will need a tunneled catheter and ongoing HD.   ABLA with evidence of acute GI bleed -  pt with hematochezia last  night and this morning.  Receiving blood transfusion.  Unfortunately colonoscopy and CT angio x 2 could not isolate location of GIB, now pointing to diffuse colonic bleeding due to diffuse ulcerations and colitis.  Surgery consulted for possible total colectomy however very high risk therefore not a surgical candidate Hyperkalemia - resolved HTN - on norvasc. Can consider coreg as the next line agent if needed Normocytic Anemia  - deferring IV iron in setting of infection.  Aranesp 40 mcg every Thursday.  Gi following for ABLA. Hgb stable at 11.5 1/24 Strep bacteremia-  streptococcus infantarius, TEE negative, getting Rocephin thru 1/17. Colonoscopy when more stable per pmd.  Acute hypoxic respiratory failure - extubated on 1/10 H/o polysubstance abuse  Gean Quint, MD [place]

## 2022-04-04 NOTE — Progress Notes (Addendum)
[facility] Progress Note  5 Days Post-Op  Subjective: CC-  No significant changes. Continues to have some intermittent diffuse abdominal pain, but main complaint is ongoing nausea. No emesis. She had multiple Bms yesterday, 1 recorded had red streaks but per patient none have been frankly bloody. No cbc this morning.  Objective: Vital signs in last 24 hours: Temp:  [98 F (36.7 C)-98.8 F (37.1 C)] 98.8 F (37.1 C) (01/25 0838) Pulse Rate:  [105-115] 105 (01/25 0838) Resp:  [15-28] 17 (01/25 0431) BP: (112-167)/(82-99) 143/94 (01/25 0838) SpO2:  [93 %-100 %] 97 % (01/25 0838) Last BM Date : 04/03/22  Intake/Output from previous day: 01/24 0701 - 01/25 0700 In: -  Out: 800 [Urine:800] Intake/Output this shift: No intake/output data recorded.  PE: General: Alert, NAD Heart: tachycardic low 100s Lungs: Respiratory effort nonlabored Abd: soft, generalized ttp without peritonitis, nondistended  Lab Results:  Recent Labs    04/02/22 0309 04/03/22 0426  WBC 14.0* 13.3*  HGB 11.5* 11.3*  HCT 34.9* 35.0*  PLT 379 413*   BMET Recent Labs    04/03/22 0426 04/04/22 0358  NA 140 137  K 4.5 4.2  CL 110 108  CO2 20* 20*  GLUCOSE 78 92  BUN 40* 35*  CREATININE 4.09* 4.20*  CALCIUM 8.7* 8.5*   PT/INR No results for input(s): "LABPROT", "INR" in the last 72 hours. CMP     Component Value Date/Time   NA 137 04/04/2022 0358   NA 137 04/20/2012 1725   K 4.2 04/04/2022 0358   K 4.2 04/20/2012 1725   CL 108 04/04/2022 0358   CL 106 04/20/2012 1725   CO2 20 (L) 04/04/2022 0358   CO2 24 04/20/2012 1725   GLUCOSE 92 04/04/2022 0358   GLUCOSE 97 04/20/2012 1725   BUN 35 (H) 04/04/2022 0358   BUN 14 04/20/2012 1725   CREATININE 4.20 (H) 04/04/2022 0358   CREATININE 0.83 09/30/2019 1057   CALCIUM 8.5 (L) 04/04/2022 0358   CALCIUM 9.2 04/20/2012 1725   PROT 5.6 (L) 03/26/2022 0734   PROT 7.9 04/20/2012 1725   ALBUMIN 1.8 (L) 04/04/2022 0358   ALBUMIN  4.0 04/20/2012 1725   AST 24 03/26/2022 0734   AST 26 04/20/2012 1725   ALT 27 03/26/2022 0734   ALT 39 04/20/2012 1725   ALKPHOS 94 03/26/2022 0734   ALKPHOS 123 04/20/2012 1725   BILITOT 0.4 03/26/2022 0734   BILITOT 0.5 04/20/2012 1725   GFRNONAA 12 (L) 04/04/2022 0358   GFRNONAA 81 09/30/2019 1057   GFRAA 93 09/30/2019 1057   Lipase     Component Value Date/Time   LIPASE 88 (H) 03/09/2022 2204       Studies/Results: No results found.  Anti-infectives: Anti-infectives (From admission, onward)    Start     Dose/Rate Route Frequency Ordered Stop   03/31/22 0600  piperacillin-tazobactam (ZOSYN) IVPB 2.25 g       See Hyperspace for full Linked Orders Report.   2.25 g 100 mL/hr over 30 Minutes Intravenous Every 8 hours 03/30/22 1801 04/05/22 2359   03/30/22 1845  piperacillin-tazobactam (ZOSYN) IVPB 3.375 g       See Hyperspace for full Linked Orders Report.   3.375 g 100 mL/hr over 30 Minutes Intravenous  Once 03/30/22 1801 03/30/22 1850   03/21/22 1000  cefTRIAXone (ROCEPHIN) 2 g in sodium chloride 0.9 % 100 mL IVPB        2 g 200 mL/hr over 30 Minutes Intravenous Every 24  hours 03/21/22 0740 03/28/22 0326   03/20/22 1200  piperacillin-tazobactam (ZOSYN) IVPB 3.375 g  Status:  Discontinued        3.375 g 12.5 mL/hr over 240 Minutes Intravenous Every 8 hours 03/20/22 0921 03/21/22 0740   03/19/22 1200  piperacillin-tazobactam (ZOSYN) IVPB 2.25 g  Status:  Discontinued        2.25 g 100 mL/hr over 30 Minutes Intravenous Every 8 hours 03/19/22 0931 03/20/22 0921   03/18/22 2000  piperacillin-tazobactam (ZOSYN) IVPB 3.375 g  Status:  Discontinued        3.375 g 12.5 mL/hr over 240 Minutes Intravenous Every 8 hours 03/18/22 1355 03/19/22 0931   03/16/22 1015  piperacillin-tazobactam (ZOSYN) IVPB 3.375 g  Status:  Discontinued        3.375 g 100 mL/hr over 30 Minutes Intravenous Every 6 hours 03/16/22 1007 03/18/22 1355   03/16/22 1000  piperacillin-tazobactam (ZOSYN)  IVPB 3.375 g  Status:  Discontinued        3.375 g 12.5 mL/hr over 240 Minutes Intravenous Every 8 hours 03/16/22 0949 03/16/22 1007   03/12/22 1800  ampicillin (OMNIPEN) 2 g in sodium chloride 0.9 % 100 mL IVPB  Status:  Discontinued        2 g 300 mL/hr over 20 Minutes Intravenous Every 6 hours 03/12/22 1629 03/16/22 0919   03/12/22 1200  ampicillin (OMNIPEN) 2 g in sodium chloride 0.9 % 100 mL IVPB  Status:  Discontinued        2 g 300 mL/hr over 20 Minutes Intravenous 2 times daily 03/12/22 0945 03/12/22 1629   03/12/22 0600  vancomycin (VANCOREADY) IVPB 500 mg/100 mL  Status:  Discontinued        500 mg 100 mL/hr over 60 Minutes Intravenous Every 48 hours 03/11/22 2256 03/11/22 2307   03/11/22 2344  vancomycin variable dose per unstable renal function (pharmacist dosing)  Status:  Discontinued         Does not apply See admin instructions 03/11/22 2344 03/12/22 0945   03/11/22 2315  vancomycin (VANCOREADY) IVPB 1750 mg/350 mL  Status:  Discontinued        1,750 mg 175 mL/hr over 120 Minutes Intravenous  Once 03/11/22 2220 03/11/22 2250   03/10/22 1200  cefTRIAXone (ROCEPHIN) 2 g in sodium chloride 0.9 % 100 mL IVPB  Status:  Discontinued        2 g 200 mL/hr over 30 Minutes Intravenous Every 24 hours 03/10/22 0934 03/12/22 0945   03/10/22 0645  vancomycin (VANCOREADY) IVPB 1500 mg/300 mL        1,500 mg 150 mL/hr over 120 Minutes Intravenous  Once 03/10/22 0547 03/10/22 0848   03/10/22 0555  vancomycin variable dose per unstable renal function (pharmacist dosing)  Status:  Discontinued         Does not apply See admin instructions 03/10/22 0555 03/10/22 0931   03/10/22 0400  ceFEPIme (MAXIPIME) 2 g in sodium chloride 0.9 % 100 mL IVPB  Status:  Discontinued        2 g 200 mL/hr over 30 Minutes Intravenous Every 24 hours 03/10/22 0303 03/10/22 0931   03/10/22 0345  metroNIDAZOLE (FLAGYL) IVPB 500 mg  Status:  Discontinued        500 mg 100 mL/hr over 60 Minutes Intravenous Every 12  hours 03/10/22 0246 03/10/22 0931        Assessment/Plan GI bleeding secondary to colitis with ulcerations - s/p colonoscopy, Biopsies negative for acute infection or inflammatory bowel disease   -  s/p transfusion 6 units from 1/20-1/21 - CTA 1/21 without localized bleeding  - surgery would be last line for this patient without localized source of bleeding, TAC with risk of continued bleeding if ulcerations in small bowel continue to bleed. TAC would also result in ileostomy with high risk of dehydration from this.  - H/h has remained stable and no longer having frankly bloody bowel movements. Bleeding seems to have resolved without surgery but she has ongoing abdominal pain, nausea, and poor oral intake. Continue medical management.   FEN: CLD, IVF - defer diet advancement to primary   VTE: SCDs, no chemical prophylaxis in setting of GI bleed ID: [email or web address]    I reviewed last 24 h vitals and pain scores, last 48 h intake and output, and last 24 h labs and trends.    LOS: 25 days    [facility] 04/04/2022, 10:38 AM Please see Amion for pager number during day hours 7:00am-4:30pm

## 2022-04-04 NOTE — Progress Notes (Signed)
Physical Therapy Treatment Patient Details Name: Darlene Barber MRN: [id number] DOB: 24-Jul-1965 Today's Date: 04/04/2022   History of Present Illness 57 yo female admitted 12/30 to [facility] with abdominal pain and rash with sepsis and AKI. Respiratory failure intubated 1/2-1/11. CRRT (1/3-1/8, 1/10-1/11). pt also with metabolic encephalopathy acutely. PMhx: chronic pain, GERD, bipolar, tobacco and THC abuse, insomnia, HTN, tremor    PT Comments    Pt participating in increased bed mobility during today's session for hygiene and clean up, with assist from RN. Pt able to perform bed mobility with min guard to minA and use of bedrails, however increased verbal cueing and encouragement provided to pt due to anxiety and negative comments she was making about herself. After pt cleaned and sheets changed, pt declining any attempts at transfers or sitting EOB, but agreeable to exercises. Pt performing ankle pumps, attempted to get pt to perform other BLE exercises, however pt declining at this time, distracted and talking about her family, limited ability to redirect today. Acute PT will continue to follow to progress mobility, discharge plan remains appropriate.     Recommendations for follow up therapy are one component of a multi-disciplinary discharge planning process, led by the attending physician.  Recommendations may be updated based on patient status, additional functional criteria and insurance authorization.  Follow Up Recommendations  Skilled nursing-short term rehab (<3 hours/day) Can patient physically be transported by private vehicle: No   Assistance Recommended at Discharge Frequent or constant Supervision/Assistance  Patient can return home with the following  (cannot return home to 2nd floor apartment)   Equipment Recommendations  Rolling walker (2 wheels)    Recommendations for Other Services       Precautions / Restrictions Precautions Precautions: Fall Restrictions Weight  Bearing Restrictions: No     Mobility  Bed Mobility Overal bed mobility: Needs Assistance Bed Mobility: Rolling Rolling: Min guard, Min assist         General bed mobility comments: Min guard to min A for safety and sequencing, assist for support when rolling.    Transfers                   General transfer comment: pt declined any transfers today    Ambulation/Gait                   Stairs             Wheelchair Mobility    Modified Rankin (Stroke Patients Only)       Balance Overall balance assessment: Needs assistance     Sitting balance - Comments: declined any sitting EOB, very emotional after bowel incontinence                                    Cognition Arousal/Alertness: Awake/alert Behavior During Therapy: Anxious Overall Cognitive Status: No family/caregiver present to determine baseline cognitive functioning                                 General Comments: Pt anxious throughout session, stating degrading comments to herself. Able to follow commands with minimal cueing but increased time        Exercises General Exercises - Lower Extremity Ankle Circles/Pumps: AROM, Both, 10 reps, Supine    General Comments General comments (skin integrity, edema, etc.): VSS throughout, pt very anxious and intermittently crying  throughout session      Pertinent Vitals/Pain Pain Assessment Pain Assessment: Faces Faces Pain Scale: Hurts little more Pain Location: stomach Pain Descriptors / Indicators: Sore Pain Intervention(s): Monitored during session, Repositioned    Home Living                          Prior Function            PT Goals (current goals can now be found in the care plan section) Acute Rehab PT Goals Patient Stated Goal: to get better PT Goal Formulation: With family Time For Goal Achievement: 04/10/22 Potential to Achieve Goals: Good Progress towards PT goals:  Progressing toward goals    Frequency    Min 2X/week      PT Plan Current plan remains appropriate    Co-evaluation              AM-PAC PT "6 Clicks" Mobility   Outcome Measure  Help needed turning from your back to your side while in a flat bed without using bedrails?: A Little Help needed moving from lying on your back to sitting on the side of a flat bed without using bedrails?: A Little Help needed moving to and from a bed to a chair (including a wheelchair)?: Total Help needed standing up from a chair using your arms (e.g., wheelchair or bedside chair)?: Total Help needed to walk in hospital room?: Total Help needed climbing 3-5 steps with a railing? : Total 6 Click Score: 10    End of Session   Activity Tolerance: Patient limited by fatigue Patient left: in bed;with call bell/phone within reach;with bed alarm set Nurse Communication: Mobility status PT Visit Diagnosis: Muscle weakness (generalized) (M62.81);Difficulty in walking, not elsewhere classified (R26.2)     Time: 1735-6701 PT Time Calculation (min) (ACUTE ONLY): 32 min  Charges:  $Therapeutic Activity: 23-37 mins                     Charlynne Cousins, PT DPT Acute Rehabilitation Services Office [phone]    Darlene Barber 04/04/2022, 2:55 PM
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
## 2022-04-04 NOTE — Progress Notes (Signed)
PROGRESS NOTE        PATIENT DETAILS Name: Darlene Barber Age: 57 y.o. Sex: female Date of Birth: 07-Apr-1965 Admit Date: 03/10/2022 Admitting Physician Rigoberto Noel, MD [id number], Chrystie Nose, MD  Brief Summary: Patient is a 57 y.o.  female who was initially admitted to the ICU with septic shock-respiratory failure requiring pressors/intubation on initial presentation.  She was subsequently was found to have Streptococcus bacteremia.  Hospital course was complicated by AKI requiring CRRT and acute metabolic encephalopathy/ICU delirium.  She was stabilized and transferred to [facility] on 1/17.  Subsequent hospital course complicated by lower GI bleeding with acute blood loss anemia-requiring transfer back to the ICU.  Ultimately she was found to have large ulcers in her entire colon.  She required multiple units of PRBC transfusion.  She was transferred back to the [facility] service on 1/24.  Significant events: 12/31>> admit to ICU-septic shock-intubated-required pressors stool studies/COVID/RSV/flu negative.  Blood cultures positive for strep 01/3 CRRT 01/5>> TEE done with no evidence of vegetations 01/11>>extubated, transferred from [facility] for iHD 01/17>> transfer to [facility] services 01/18>> developed lower GI bleeding-GI consulted-PRBC transfused 01/18>> transfer back to the ICU 01/20>> colonoscopy with multiple ulcers in colon. 01/24>> transferred to [facility]  Significant studies: 12/30>> CT chest/abdomen/pelvis: Fluid throughout stomach/small bowel/colon-nonspecific 01/05>> CT chest/abdomen/pelvis: Persistent areas of wall thickening along the colon, patchy bilateral ill-defined lung opacities 01/19>> CT abdomen/pelvis: Diarrheal illness with fluid reaching the rectum, colonic wall thickening is improved.  Improving bilateral PNA.  Bilateral nephrolithiasis without obstruction. 01/19>> CT angio GI bleed: Limited evaluation because of oral contrast within the distal small  bowel/throughout colon-active GI bleeding could not be assessed. 01/21>> CT angio GI bleed: No active GI bleeding, multiple infectious/inflammatory colitis involving splenic flexure and ascending colon.  Significant microbiology data: 12/30>> blood culture: Streptococcus Infantarius 12/30>> stool C. difficile negative 12/30>> GI pathogen panel: Negative 01/02>> tracheal aspirate: No organisms 01/03>> blood culture: No growth 01/03>> blood culture: No growth 01/17>> blood culture: No growth  Procedures: 12/31-1/11>> ETT 1/05>> TEE: EF 60-65%-no vegetations 1/20>> colonoscopy: Blood in entire colon, multiple ulcers throughout the colon.  Consults: PCCM GI General surgery Nephrology ID  Subjective: Brown stools this morning.  Some abdominal pain-tolerating clear liquids.  Objective: Vitals: Blood pressure (!) 143/94, pulse (!) 105, temperature 98.8 F (37.1 C), resp. rate 17, height 4\' 10"  (1.473 m), weight 74.9 kg, SpO2 97 %.   Exam: Gen Exam:Alert awake-not in any distress HEENT:atraumatic, normocephalic Chest: B/L clear to auscultation anteriorly CVS:S1S2 regular Abdomen:soft mildly but diffusely tender all over. Extremities:no edema Neurology: Non focal Skin: no rash  Pertinent Labs/Radiology:    Latest Ref Rng & Units 04/03/2022    4:26 AM 04/02/2022    3:09 AM 04/01/2022    6:36 PM  CBC  WBC 4.0 - 10.5 K/uL 13.3  14.0  14.2   Hemoglobin 12.0 - 15.0 g/dL 11.3  11.5  11.4   Hematocrit 36.0 - 46.0 % 35.0  34.9  33.0   Platelets 150 - 400 K/uL 413  379  360     Lab Results  Component Value Date   NA 137 04/04/2022   K 4.2 04/04/2022   CL 108 04/04/2022   CO2 20 (L) 04/04/2022      Assessment/Plan: Septic shock due to Streptococcus Infantarius bacteremia Multifocal PNA-possibly ventilator associated PNA Sepsis physiology has resolved Had  completed a course of antibiotics on 1/17-now back on Zosyn for severe colitis  TEE negative for vegetations  Acute  toxic/metabolic encephalopathy ICU delirium Multifactorial etiology-ICU delirium/encephalopathy due to sepsis/AKI Much improved-awake/alert this morning. Maintain delirium precautions Continue Seroquel 300 nightly Continue vilazodone  AKI Multifactorial etiology-felt to be ischemic ATN from septic shock-possibly some component of infectious (strep) glomerulonephritis-and now possibly contrast related (CT angio)  Avoid nephrotoxic agents Nephrology following Defer decision to resume HD/CRRT per nephrology service  Shock liver Improved Trend LFTs periodically  Lower GI bleeding due to severe colitis with multiple colon ulcers Acute blood loss anemia Follow CBC trend (8 units PRBC from 1/18-1/21) CT angio without any localized bleeding Empirically on Zosyn since 1/20 Recent stool studies negative Tolerating clear liquids-advance to full liquids today. Supportive care Pathology consistent with ischemic colitis per GI. GI/general surgery following  HTN BP stable Continue amlodipine  GERD PPI  Bipolar disorder/depression Complicated by encephalopathy Continue Seroquel/vilazodone  COPD Not in exacerbation-continue bronchodilators  OSA CPAP when able  Nutrition Status: Nutrition Problem: Inadequate oral intake Etiology: inability to eat Signs/Symptoms: NPO status (on vent) Interventions: Refer to RD note for recommendations  Obesity: Estimated body mass index is 34.51 kg/m as calculated from the following:   Height as of this encounter: 4\' 10"  (1.473 m).   Weight as of this encounter: 74.9 kg.   Code status:   Code Status: Full Code   DVT Prophylaxis: Place and maintain sequential compression device Start: 03/13/22 2117 SCDs Start: 03/10/22 0232   Family Communication: None at bedside   Disposition Plan: Status is: Inpatient Remains inpatient appropriate because: Severity of illness   Planned Discharge Destination:Skilled nursing  facility   Diet: Diet Order             Diet clear liquid Room service appropriate? Yes; Fluid consistency: Thin  Diet effective now                     Antimicrobial agents: Anti-infectives (From admission, onward)    Start     Dose/Rate Route Frequency Ordered Stop   03/31/22 0600  piperacillin-tazobactam (ZOSYN) IVPB 2.25 g       See Hyperspace for full Linked Orders Report.   2.25 g 100 mL/hr over 30 Minutes Intravenous Every 8 hours 03/30/22 1801 04/05/22 2359   03/30/22 1845  piperacillin-tazobactam (ZOSYN) IVPB 3.375 g       See Hyperspace for full Linked Orders Report.   3.375 g 100 mL/hr over 30 Minutes Intravenous  Once 03/30/22 1801 03/30/22 1850   03/21/22 1000  cefTRIAXone (ROCEPHIN) 2 g in sodium chloride 0.9 % 100 mL IVPB        2 g 200 mL/hr over 30 Minutes Intravenous Every 24 hours 03/21/22 0740 03/28/22 0326   03/20/22 1200  piperacillin-tazobactam (ZOSYN) IVPB 3.375 g  Status:  Discontinued        3.375 g 12.5 mL/hr over 240 Minutes Intravenous Every 8 hours 03/20/22 0921 03/21/22 0740   03/19/22 1200  piperacillin-tazobactam (ZOSYN) IVPB 2.25 g  Status:  Discontinued        2.25 g 100 mL/hr over 30 Minutes Intravenous Every 8 hours 03/19/22 0931 03/20/22 0921   03/18/22 2000  piperacillin-tazobactam (ZOSYN) IVPB 3.375 g  Status:  Discontinued        3.375 g 12.5 mL/hr over 240 Minutes Intravenous Every 8 hours 03/18/22 1355 03/19/22 0931   03/16/22 1015  piperacillin-tazobactam (ZOSYN) IVPB 3.375 g  Status:  Discontinued  3.375 g 100 mL/hr over 30 Minutes Intravenous Every 6 hours 03/16/22 1007 03/18/22 1355   03/16/22 1000  piperacillin-tazobactam (ZOSYN) IVPB 3.375 g  Status:  Discontinued        3.375 g 12.5 mL/hr over 240 Minutes Intravenous Every 8 hours 03/16/22 0949 03/16/22 1007   03/12/22 1800  ampicillin (OMNIPEN) 2 g in sodium chloride 0.9 % 100 mL IVPB  Status:  Discontinued        2 g 300 mL/hr over 20 Minutes Intravenous Every  6 hours 03/12/22 1629 03/16/22 0919   03/12/22 1200  ampicillin (OMNIPEN) 2 g in sodium chloride 0.9 % 100 mL IVPB  Status:  Discontinued        2 g 300 mL/hr over 20 Minutes Intravenous 2 times daily 03/12/22 0945 03/12/22 1629   03/12/22 0600  vancomycin (VANCOREADY) IVPB 500 mg/100 mL  Status:  Discontinued        500 mg 100 mL/hr over 60 Minutes Intravenous Every 48 hours 03/11/22 2256 03/11/22 2307   03/11/22 2344  vancomycin variable dose per unstable renal function (pharmacist dosing)  Status:  Discontinued         Does not apply See admin instructions 03/11/22 2344 03/12/22 0945   03/11/22 2315  vancomycin (VANCOREADY) IVPB 1750 mg/350 mL  Status:  Discontinued        1,750 mg 175 mL/hr over 120 Minutes Intravenous  Once 03/11/22 2220 03/11/22 2250   03/10/22 1200  cefTRIAXone (ROCEPHIN) 2 g in sodium chloride 0.9 % 100 mL IVPB  Status:  Discontinued        2 g 200 mL/hr over 30 Minutes Intravenous Every 24 hours 03/10/22 0934 03/12/22 0945   03/10/22 0645  vancomycin (VANCOREADY) IVPB 1500 mg/300 mL        1,500 mg 150 mL/hr over 120 Minutes Intravenous  Once 03/10/22 0547 03/10/22 0848   03/10/22 0555  vancomycin variable dose per unstable renal function (pharmacist dosing)  Status:  Discontinued         Does not apply See admin instructions 03/10/22 0555 03/10/22 0931   03/10/22 0400  ceFEPIme (MAXIPIME) 2 g in sodium chloride 0.9 % 100 mL IVPB  Status:  Discontinued        2 g 200 mL/hr over 30 Minutes Intravenous Every 24 hours 03/10/22 0303 03/10/22 0931   03/10/22 0345  metroNIDAZOLE (FLAGYL) IVPB 500 mg  Status:  Discontinued        500 mg 100 mL/hr over 60 Minutes Intravenous Every 12 hours 03/10/22 0246 03/10/22 0931        MEDICATIONS: Scheduled Meds:  sodium chloride   Intravenous Once   amLODipine  10 mg Oral Daily   arformoterol  15 mcg Nebulization BID   budesonide (PULMICORT) nebulizer solution  0.5 mg Nebulization BID   Chlorhexidine Gluconate Cloth  6  each Topical Daily   darbepoetin (ARANESP) injection - DIALYSIS  40 mcg Subcutaneous Q Fri-1800   feeding supplement  1 Container Oral TID BM   insulin aspart  0-15 Units Subcutaneous Q4H   multivitamin  1 tablet Oral QHS   nortriptyline  50 mg Oral QHS   mouth rinse  15 mL Mouth Rinse 4 times per day   pantoprazole (PROTONIX) IV  40 mg Intravenous Q24H   QUEtiapine  300 mg Oral QHS   revefenacin  175 mcg Nebulization Daily   sodium chloride flush  10-40 mL Intracatheter Q12H   Vilazodone HCl  40 mg Oral Daily  Continuous Infusions:  sodium chloride 10 mL/hr at 04/02/22 1500   sodium chloride 10 mL (04/01/22 1039)   dextrose 5 % and 0.45% NaCl Stopped (03/31/22 0730)   lactated ringers 1,000 mL (04/04/22 0610)   piperacillin-tazobactam (ZOSYN)  IV 2.25 g (04/04/22 0610)   PRN Meds:.sodium chloride, acetaminophen, albuterol, docusate, haloperidol lactate, labetalol, lip balm, mineral oil-hydrophilic petrolatum, ondansetron (ZOFRAN) IV, mouth rinse, polyethylene glycol, promethazine, sodium chloride flush   I have personally reviewed following labs and imaging studies  LABORATORY DATA: CBC: Recent Labs  Lab 03/29/22 2207 03/30/22 0349 03/31/22 0620 03/31/22 2150 04/01/22 0320 04/01/22 1052 04/01/22 1836 04/02/22 0309 04/03/22 0426  WBC 13.9*   < > 14.9*   < > 12.7* 15.2* 14.2* 14.0* 13.3*  NEUTROABS 11.3*  --  12.4*  --   --   --   --   --   --   HGB 6.8*   < > 10.4*   < > 11.6* 13.1 11.4* 11.5* 11.3*  HCT 19.7*   < > 29.9*   < > 34.2* 39.3 33.0* 34.9* 35.0*  MCV 87.9   < > 88.2   < > 88.4 88.7 87.3 89.5 90.7  PLT 224   < > 249   < > 312 382 360 379 413*   < > = values in this interval not displayed.     Basic Metabolic Panel: Recent Labs  Lab 03/31/22 0620 03/31/22 2150 04/01/22 0320 04/02/22 0309 04/03/22 0426 04/04/22 0358  NA 134* 141 142 137 140 137  K 4.7 4.4 4.5 4.4 4.5 4.2  CL 109 109 111 106 110 108  CO2 11* 17* 18* 16* 20* 20*  GLUCOSE 118* 105*  93 90 78 92  BUN 53* 50* 49* 45* 40* 35*  CREATININE 3.93* 4.20* 4.17* 4.24* 4.09* 4.20*  CALCIUM 8.3* 8.5* 8.2* 8.5* 8.7* 8.5*  MG  --  1.4*  --   --   --   --   PHOS 7.1*  --  9.0* 6.8* 7.0* 6.3*     GFR: Estimated Creatinine Clearance: 12.9 mL/min (A) (by C-G formula based on SCr of 4.2 mg/dL (H)).  Liver Function Tests: Recent Labs  Lab 03/31/22 0620 04/01/22 0320 04/02/22 0309 04/03/22 0426 04/04/22 0358  ALBUMIN 1.8* 1.7* 1.8* 1.7* 1.8*    No results for input(s): "LIPASE", "AMYLASE" in the last 168 hours. No results for input(s): "AMMONIA" in the last 168 hours.  Coagulation Profile: Recent Labs  Lab 03/29/22 1346 03/30/22 2215 03/31/22 0620  INR 1.3* 1.4* 1.4*     Cardiac Enzymes: No results for input(s): "CKTOTAL", "CKMB", "CKMBINDEX", "TROPONINI" in the last 168 hours.  BNP (last 3 results) No results for input(s): "PROBNP" in the last 8760 hours.  Lipid Profile: No results for input(s): "CHOL", "HDL", "LDLCALC", "TRIG", "CHOLHDL", "LDLDIRECT" in the last 72 hours.  Thyroid Function Tests: No results for input(s): "TSH", "T4TOTAL", "FREET4", "T3FREE", "THYROIDAB" in the last 72 hours.  Anemia Panel: No results for input(s): "VITAMINB12", "FOLATE", "FERRITIN", "TIBC", "IRON", "RETICCTPCT" in the last 72 hours.  Urine analysis:    Component Value Date/Time   COLORURINE YELLOW 03/21/2022 1422   APPEARANCEUR CLOUDY (A) 03/21/2022 1422   APPEARANCEUR Clear 12/25/2021 1409   LABSPEC 1.014 03/21/2022 1422   LABSPEC 1.010 04/20/2012 1725   PHURINE 7.0 03/21/2022 1422   GLUCOSEU 50 (A) 03/21/2022 1422   GLUCOSEU Negative 04/20/2012 1725   HGBUR MODERATE (A) 03/21/2022 1422   BILIRUBINUR NEGATIVE 03/21/2022 1422   BILIRUBINUR Negative  12/25/2021 1409   BILIRUBINUR Negative 04/20/2012 1725   KETONESUR NEGATIVE 03/21/2022 1422   PROTEINUR 100 (A) 03/21/2022 1422   NITRITE NEGATIVE 03/21/2022 1422   LEUKOCYTESUR SMALL (A) 03/21/2022 1422    LEUKOCYTESUR Negative 04/20/2012 1725    Sepsis Labs: Lactic Acid, Venous    Component Value Date/Time   LATICACIDVEN 1.8 03/10/2022 0049    MICROBIOLOGY: Recent Results (from the past 240 hour(s))  Culture, blood (Routine X 2) w Reflex to ID Panel     Status: None   Collection Time: 03/27/22 11:05 PM   Specimen: BLOOD LEFT ARM  Result Value Ref Range Status   Specimen Description BLOOD LEFT ARM  Final   Special Requests   Final    BOTTLES DRAWN AEROBIC AND ANAEROBIC Blood Culture adequate volume   Culture   Final    NO GROWTH 5 DAYS Performed at [facility], [place] [address], [place], [place] [address]    Report Status 04/01/2022 FINAL  Final    RADIOLOGY STUDIES/RESULTS: No results found.   LOS: 25 days   Oren Binet, MD  [facility]    To contact the attending provider between 7A-7P or the covering provider during after hours 7P-7A, please log into the web site [email or web address] and access using universal [place] password for that web site. If you do not have the password, please call the hospital operator.  04/04/2022, 11:55 AM

## 2022-04-05 DIAGNOSIS — R7881 Bacteremia: Secondary | ICD-10-CM | POA: Diagnosis not present

## 2022-04-05 DIAGNOSIS — A419 Sepsis, unspecified organism: Secondary | ICD-10-CM | POA: Diagnosis not present

## 2022-04-05 DIAGNOSIS — D62 Acute posthemorrhagic anemia: Secondary | ICD-10-CM | POA: Diagnosis not present

## 2022-04-05 DIAGNOSIS — G9340 Encephalopathy, unspecified: Secondary | ICD-10-CM | POA: Diagnosis not present

## 2022-04-05 LAB — RENAL FUNCTION PANEL
Albumin: 1.8 g/dL — ABNORMAL LOW (ref 3.5–5.0)
Anion gap: 13 (ref 5–15)
BUN: 29 mg/dL — ABNORMAL HIGH (ref 6–20)
CO2: 19 mmol/L — ABNORMAL LOW (ref 22–32)
Calcium: 8.6 mg/dL — ABNORMAL LOW (ref 8.9–10.3)
Chloride: 105 mmol/L (ref 98–111)
Creatinine, Ser: 3.92 mg/dL — ABNORMAL HIGH (ref 0.44–1.00)
GFR, Estimated: 13 mL/min — ABNORMAL LOW (ref >60)
Glucose, Bld: 93 mg/dL (ref 70–99)
Phosphorus: 5.3 mg/dL — ABNORMAL HIGH (ref 2.5–4.6)
Potassium: 4.5 mmol/L (ref 3.5–5.1)
Sodium: 137 mmol/L (ref 135–145)

## 2022-04-05 LAB — CBC
HCT: 38.9 % (ref 36.0–46.0)
Hemoglobin: 12.5 g/dL (ref 12.0–15.0)
MCH: 29.4 pg (ref 26.0–34.0)
MCHC: 32.1 g/dL (ref 30.0–36.0)
MCV: 91.5 fL (ref 80.0–100.0)
Platelets: 423 10*3/uL — ABNORMAL HIGH (ref 150–400)
RBC: 4.25 MIL/uL (ref 3.87–5.11)
RDW: 16.7 % — ABNORMAL HIGH (ref 11.5–15.5)
WBC: 11.5 10*3/uL — ABNORMAL HIGH (ref 4.0–10.5)
nRBC: 0 % (ref 0.0–0.2)

## 2022-04-05 LAB — GLUCOSE, CAPILLARY
Glucose-Capillary: 140 mg/dL — ABNORMAL HIGH (ref 70–99)
Glucose-Capillary: 80 mg/dL (ref 70–99)
Glucose-Capillary: 91 mg/dL (ref 70–99)
Glucose-Capillary: 93 mg/dL (ref 70–99)
Glucose-Capillary: 97 mg/dL (ref 70–99)

## 2022-04-05 MED ORDER — OXYCODONE-ACETAMINOPHEN 5-325 MG PO TABS
1.0000 | ORAL_TABLET | Freq: Four times a day (QID) | ORAL | Status: DC | PRN
  Administered 2022-04-05 – 2022-04-07 (×4): 1 via ORAL
  Filled 2022-04-05 (×4): qty 1

## 2022-04-05 MED ORDER — LORAZEPAM 2 MG/ML IJ SOLN
0.5000 mg | Freq: Four times a day (QID) | INTRAMUSCULAR | Status: DC | PRN
  Administered 2022-04-05 – 2022-04-21 (×7): 0.5 mg via INTRAVENOUS
  Filled 2022-04-05 (×7): qty 1

## 2022-04-05 MED ORDER — SODIUM BICARBONATE 650 MG PO TABS
650.0000 mg | ORAL_TABLET | Freq: Two times a day (BID) | ORAL | Status: DC
  Administered 2022-04-05 – 2022-04-18 (×28): 650 mg via ORAL
  Filled 2022-04-05 (×28): qty 1

## 2022-04-05 NOTE — NC FL2 (Signed)
[place] MEDICAID FL2 LEVEL OF CARE FORM     IDENTIFICATION  Patient Name: Darlene Barber Birthdate: November 23, 1965 Sex: female Admission Date (Current Location): 03/10/2022  [facility] and [place] Number:  Herbalist and Address:  [facility], [place] [address] [place], [place] [address]      Provider Number: [id number]  Attending Physician Name and Address:  Jonetta Osgood, MD  Relative Name and Phone Number:  Zyion Leidner [phone]    Current Level of Care: Hospital Recommended Level of Care: [place] Prior Approval Number:    Date Approved/Denied:   PASRR Number: [id number] E expires 05/04/22  Discharge Plan: SNF    Current Diagnoses: Patient Active Problem List   Diagnosis Date Noted   Colitis 04/03/2022   Ulceration, colon 03/31/2022   Lower GI bleed 03/29/2022   Anemia, posthemorrhagic, acute 03/29/2022   Hematochezia 03/28/2022   Anemia 03/28/2022   Acute encephalopathy 03/22/2022   Bacteremia 03/15/2022   Rash 03/15/2022   Acute respiratory failure with hypoxia ([place]) 03/15/2022   AKI (acute kidney injury) ([place]) 03/12/2022   Septic shock ([place]) 03/10/2022   Balance disorder 06/29/2020   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   Centrilobular emphysema ([place]) 05/31/2019   Chronic upper extremity pain (Secondary Area of Pain) (Bilateral) (L>R) 03/16/2018   Primary osteoarthritis involving multiple joints 03/16/2018   Vitamin D deficiency 03/16/2018   Cervicogenic headache 03/16/2018   Cervical facet hypertrophy 03/16/2018   Cervical facet syndrome (Bilateral) (L>R) 03/16/2018   Cervical foraminal stenosis (C4-C7) (Bilateral) (L>R) 03/16/2018   Cervical central spinal stenosis (C5-6) 03/16/2018   Cervical spondylitis w/ radiculitis (HCC) 03/16/2018   Cervical radiculitis (Bilateral) (L>R) 03/16/2018   Osteoarthritis of hips (Bilateral) 03/16/2018   DDD (degenerative disc disease), cervical  03/16/2018   Marijuana use 03/16/2018   Migraines 02/12/2018   Seasonal allergies 02/12/2018   Chronic neck pain (Primary Area of Pain) (Bilateral) (L>R) 02/12/2018   Chronic hip pain (Tertiary Area of Pain) (Bilateral) (R>L) 02/12/2018   Chronic lower extremity pain (Fourth Area of Pain) (Right) 02/12/2018   Chronic pain syndrome 02/12/2018   Opiate use 02/12/2018   Pharmacologic therapy 02/12/2018   Disorder of skeletal system 02/12/2018   Problems influencing health status 02/12/2018   Hyperlipidemia associated with type 2 diabetes mellitus ([place]) 11/20/2017   Encounter for screening colonoscopy    Cervicalgia 10/17/2017   Tremor 09/16/2017   Drug-induced tremor 12/16/2016   Vasomotor flushing 09/10/2016   Post menopausal syndrome 08/21/2016   Controlled type 2 diabetes mellitus with complication, without long-term current use of insulin ([place]) 08/21/2016   Bipolar depression ([place]) 08/20/2016   Insomnia 08/20/2016   Essential hypertension 08/20/2016   Tobacco abuse 08/20/2016   Right leg swelling 01/30/2016   Trochanteric bursitis of hip (Right) 01/30/2016   Numbness and tingling 11/15/2014   Obesity (BMI 30.0-34.9) 11/15/2014   OSA on CPAP 11/15/2014   Abdominal bloating 12/06/2013   Mixed incontinence urge and stress 08/22/2012   Atrophy of vagina 08/22/2012   Dyspareunia 08/22/2012   Gastroesophageal reflux disease without esophagitis 06/19/2011   Flatulence, eructation and gas pain 06/19/2011    Orientation RESPIRATION BLADDER Height & Weight     Self, Time, Situation, Place  Normal Incontinent, External catheter Weight: 165 lb 2 oz (74.9 kg) Height:  4\' 10"  (147.3 cm)  BEHAVIORAL SYMPTOMS/MOOD NEUROLOGICAL BOWEL NUTRITION STATUS      Incontinent Diet (See d/c summary)  AMBULATORY STATUS COMMUNICATION OF NEEDS Skin  Limited Assist Verbally Other (Comment) (skin tear on finger, ankle, knee; MASD on groin)                       Personal Care Assistance Level  of Assistance  Bathing, Feeding, Dressing Bathing Assistance: Maximum assistance Feeding assistance: Limited assistance Dressing Assistance: Limited assistance     Functional Limitations Info  Hearing Sight Info: Adequate Hearing Info: Impaired Speech Info: Adequate    SPECIAL CARE FACTORS FREQUENCY  PT (By licensed PT), OT (By licensed OT)     PT Frequency: 5x/wk OT Frequency: 5x/wk            Contractures Contractures Info: Not present    Additional Factors Info  Code Status, Allergies Code Status Info: Full Allergies Info: [place], Tape           Current Medications (04/05/2022):  This is the current hospital active medication list Current Facility-Administered Medications  Medication Dose Route Frequency Provider Last Rate Last Admin   0.9 %  sodium chloride infusion (Manually program via Guardrails IV Fluids)   Intravenous Once Ogan, Okoron[place]wo U, MD       0.9 %  sodium chloride infusion  250 mL Intravenous Continuous George Hugh, MD   Stopped at 04/03/22 0928   0.9 %  sodium chloride infusion   Intravenous PRN George Hugh, MD 0 mL/hr at 04/01/22 1039 10 mL at 04/01/22 1039   acetaminophen (TYLENOL) tablet 650 mg  650 mg Oral Q6H PRN Jacky Kindle, MD   650 mg at 04/04/22 1756   albuterol (PROVENTIL) (2.5 MG/3ML) 0.083% nebulizer solution 2.5 mg  2.5 mg Nebulization Q4H PRN George Hugh, MD       amLODipine (NORVASC) tablet 10 mg  10 mg Oral Daily Freda Jackson B, MD   10 mg at 04/05/22 8182   arformoterol (BROVANA) nebulizer solution 15 mcg  15 mcg Nebulization BID George Hugh, MD   15 mcg at 04/03/22 2028   budesonide (PULMICORT) nebulizer solution 0.5 mg  0.5 mg Nebulization BID George Hugh, MD   0.5 mg at 04/03/22 2028   Chlorhexidine Gluconate Cloth 2 % PADS 6 each  6 each Topical Daily George Hugh, MD   6 each at 04/05/22 0926   Darbepoetin Alfa (ARANESP) injection 40 mcg  40 mcg Subcutaneous Q Fri-1800 George Hugh, MD   40 mcg at 03/29/22  1715   dextrose 5 %-0.45 % sodium chloride infusion   Intravenous Continuous Elmer Sow, MD   Stopped at 03/31/22 0730   docusate (COLACE) 50 MG/5ML liquid 100 mg  100 mg Oral BID PRN George Hugh, MD       feeding supplement (BOOST / RESOURCE BREEZE) liquid 1 Container  1 Container Oral TID BM Freddi Starr, MD   1 Container at 04/05/22 0925   haloperidol lactate (HALDOL) injection 2 mg  2 mg Intravenous Q6H PRN George Hugh, MD   2 mg at 03/30/22 9937   insulin aspart (novoLOG) injection 0-15 Units  0-15 Units Subcutaneous Q4H George Hugh, MD   2 Units at 04/04/22 0834   labetalol (NORMODYNE) injection 5-10 mg  5-10 mg Intravenous Q2H PRN George Hugh, MD   5 mg at 04/04/22 1805   lactated ringers infusion   Intravenous Continuous Gean Quint, MD 75 mL/hr at 04/05/22 0654 Infusion Verify at 04/05/22 0654   lip balm (CARMEX) ointment   Topical PRN George Hugh, MD   75 Application at 16/96/78 1210   LORazepam (ATIVAN)  injection 0.5 mg  0.5 mg Intravenous Q6H PRN Howerter, Justin B, DO   0.5 mg at 04/05/22 0930   mineral oil-hydrophilic petrolatum (AQUAPHOR) ointment   Topical Daily PRN Jonetta Osgood, MD       multivitamin (RENA-VIT) tablet 1 tablet  1 tablet Oral QHS George Hugh, MD   1 tablet at 04/04/22 2148   nortriptyline (PAMELOR) capsule 50 mg  50 mg Oral QHS Freda Jackson B, MD   50 mg at 04/04/22 2148   ondansetron (ZOFRAN) injection 4 mg  4 mg Intravenous Q6H PRN George Hugh, MD   4 mg at 04/05/22 0938   Oral care mouth rinse  15 mL Mouth Rinse 4 times per day George Hugh, MD   15 mL at 04/05/22 1829   Oral care mouth rinse  15 mL Mouth Rinse PRN Freddi Starr, MD       pantoprazole (PROTONIX) injection 40 mg  40 mg Intravenous Q24H Vena Rua, PA-C   40 mg at 04/05/22 0925   piperacillin-tazobactam (ZOSYN) IVPB 2.25 g  2.25 g Intravenous Q8H Freda Jackson B, MD 100 mL/hr at 04/05/22 0654 Infusion Verify at 04/05/22 0654   polyethylene  glycol (MIRALAX / GLYCOLAX) packet 17 g  17 g Oral Daily PRN George Hugh, MD       promethazine (PHENERGAN) tablet 12.5 mg  12.5 mg Oral Q6H PRN Freda Jackson B, MD   12.5 mg at 04/04/22 1410   QUEtiapine (SEROQUEL) tablet 300 mg  300 mg Oral QHS Freddi Starr, MD   300 mg at 04/04/22 2146   revefenacin (YUPELRI) nebulizer solution 175 mcg  175 mcg Nebulization Daily George Hugh, MD   175 mcg at 04/01/22 9371   sodium chloride flush (NS) 0.9 % injection 10-40 mL  10-40 mL Intracatheter Q12H George Hugh, MD   10 mL at 04/05/22 0925   sodium chloride flush (NS) 0.9 % injection 10-40 mL  10-40 mL Intracatheter PRN George Hugh, MD   10 mL at 04/04/22 2148   Vilazodone HCl TABS 40 mg  40 mg Oral Daily George Hugh, MD   40 mg at 04/05/22 6967     Discharge Medications: Please see discharge summary for a list of discharge medications.  Relevant Imaging Results:  Relevant Lab Results:   Additional Information SS#: [id number]  Lissa Morales Lander Eslick, LCSW

## 2022-04-05 NOTE — Progress Notes (Addendum)
TRH night cross cover note:   I was notified by RN that the patient is unable to tolerate the oral contrast required for her CAT scan at this time in the setting of recurrent nausea/vomiting in spite of orders for prn antiemetics.  In this context, will refrain from oral contrast/CAT scan at this time, and pursue escalated antiemetic approach, including addition of prn IV Ativan for refractory nausea/vomiting which will be added to existing order for prn Phenergan.  Ability to tolerate oral contrast in preparation for CT scan to be reassessed on dayshift following this escalated prn antiemetic regimen.     Update: patient's RN notified me of systolic blood pressures in the 170s, with corresponding heart rates in the 80s to 90s.  It appears that she is here with cellulitis.  RN noted that the patient currently has no prn antihypertensives ordered.  I subsequently placed order for prn IV labetalol for systolic blood pressure greater than 136 or diastolic blood pressure greater than 100 mmHg.     Babs Bertin, DO Hospitalist

## 2022-04-05 NOTE — Progress Notes (Signed)
Occupational Therapy Treatment Patient Details Name: Darlene Barber MRN: [id number] DOB: 10-22-1965 Today's Date: 04/05/2022   History of present illness 57 yo female admitted 12/30 to [facility] with abdominal pain and rash with sepsis and AKI. Respiratory failure intubated 1/2-1/11. CRRT (1/3-1/8, 1/10-1/11). pt also with metabolic encephalopathy acutely. PMhx: chronic pain, GERD, bipolar, tobacco and THC abuse, insomnia, HTN, tremor   OT comments  Focus session on return to ADL. Pt needing to use restroom, so transfer to [facility] Hights with taking two steps toward BSC. Pt with improved confidence this session and decreased anxiety, although intermittently still present. Min A for transfers; total A for posterior pericare due to need for BUE to maintain standing balance. Pt continues to present with decreased activity tolerance, strength, and safety. Will continue to follow.    Recommendations for follow up therapy are one component of a multi-disciplinary discharge planning process, led by the attending physician.  Recommendations may be updated based on patient status, additional functional criteria and insurance authorization.    Follow Up Recommendations  Skilled nursing-short term rehab (<3 hours/day)     Assistance Recommended at Discharge Frequent or constant Supervision/Assistance  Patient can return home with the following  Two people to help with walking and/or transfers;Two people to help with bathing/dressing/bathroom;Assistance with cooking/housework;Assistance with feeding;Direct supervision/assist for medications management;Direct supervision/assist for financial management;Assist for transportation;Help with stairs or ramp for entrance   Equipment Recommendations  Other (comment) (defer)    Recommendations for Other Services      Precautions / Restrictions Precautions Precautions: Fall Restrictions Weight Bearing Restrictions: No       Mobility Bed Mobility Overal bed mobility:  Needs Assistance             General bed mobility comments: EOB on arrival and departure with son in room    Transfers Overall transfer level: Needs assistance Equipment used: 1 person hand held assist Transfers: Sit to/from Stand Sit to Stand: Min assist Stand pivot transfers: Min assist         General transfer comment: Min A for balance     Balance Overall balance assessment: Needs assistance Sitting-balance support: No upper extremity supported, Feet unsupported Sitting balance-Leahy Scale: Fair     Standing balance support: Bilateral upper extremity supported, During functional activity Standing balance-Leahy Scale: Poor Standing balance comment: reliant on BUE support                           ADL either performed or assessed with clinical judgement   ADL Overall ADL's : Needs assistance/impaired                         Toilet Transfer: Minimal assistance;BSC/3in1;Stand-pivot Toilet Transfer Details (indicate cue type and reason): stand pivot to [facility] with min A and back Toileting- Clothing Manipulation and Hygiene: Total assistance;Sit to/from stand Toileting - Clothing Manipulation Details (indicate cue type and reason): requires BUE to mainatain standing balance     Functional mobility during ADLs: Minimal assistance (SPT) General ADL Comments: decreased activity tolerance    Extremity/Trunk Assessment Upper Extremity Assessment Upper Extremity Assessment: Overall WFL for tasks assessed;Generalized weakness   Lower Extremity Assessment Lower Extremity Assessment: Defer to PT evaluation        Vision       Perception     Praxis      Cognition Arousal/Alertness: Awake/alert Behavior During Therapy: Anxious Overall Cognitive Status: No family/caregiver present to determine  baseline cognitive functioning                                 General Comments: Pt recalling this therapist and with decreased anxiety.  to perform basic transfers. Very aware of current functional abilities        Exercises      Shoulder Instructions       General Comments VSS    Pertinent Vitals/ Pain       Pain Assessment Pain Assessment: Faces Faces Pain Scale: Hurts a little bit Pain Location: stomach Pain Descriptors / Indicators: Sore Pain Intervention(s): Limited activity within patient's tolerance, Monitored during session  Home Living                                          Prior Functioning/Environment              Frequency  Min 2X/week        Progress Toward Goals  OT Goals(current goals can now be found in the care plan section)  Progress towards OT goals: Progressing toward goals  Acute Rehab OT Goals Patient Stated Goal: get stronger and eat OT Goal Formulation: With patient Time For Goal Achievement: 04/11/22 Potential to Achieve Goals: Good ADL Goals Pt Will Perform Grooming: standing;with min guard assist Pt Will Perform Lower Body Dressing: with min guard assist;sit to/from stand Pt Will Transfer to Toilet: with min guard assist;stand pivot transfer;regular height toilet Pt/caregiver will Perform Home Exercise Program: Increased strength;Right Upper extremity;Left upper extremity;Increased ROM  Plan Discharge plan remains appropriate;Frequency remains appropriate    Co-evaluation                 AM-PAC OT "6 Clicks" Daily Activity     Outcome Measure   Help from another person eating meals?: A Lot Help from another person taking care of personal grooming?: A Little Help from another person toileting, which includes using toliet, bedpan, or urinal?: A Lot Help from another person bathing (including washing, rinsing, drying)?: A Lot Help from another person to put on and taking off regular upper body clothing?: A Little Help from another person to put on and taking off regular lower body clothing?: A Lot 6 Click Score: 14    End of Session     OT Visit Diagnosis: History of falling (Z91.81);Muscle weakness (generalized) (M62.81);Repeated falls (R29.6);Other symptoms and signs involving cognitive function;Pain   Activity Tolerance Patient tolerated treatment well   Patient Left in bed;with call bell/phone within reach;with family/visitor present   Nurse Communication Mobility status (RN requested not to set bed alarm as she is outside door and family present)        Time: 5038-8828 OT Time Calculation (min): 20 min  Charges: OT General Charges $OT Visit: 1 Visit OT Treatments $Self Care/Home Management : 8-22 mins  Elder Cyphers, OTR/L [facility] Acute Rehabilitation Office: [phone]   Magnus Ivan 04/05/2022, 5:53 PM

## 2022-04-05 NOTE — Progress Notes (Signed)
PROGRESS NOTE        PATIENT DETAILS Name: Darlene Barber Age: 57 y.o. Sex: female Date of Birth: [birth year] Admit Date: 03/10/2022 Admitting Physician Rigoberto Noel, MD SKA:[id number], Chrystie Nose, MD  Brief Summary: Patient is a 57 y.o.  female who was initially admitted to the ICU with septic shock-respiratory failure requiring pressors/intubation on initial presentation.  She was subsequently was found to have Streptococcus bacteremia.  Hospital course was complicated by AKI requiring CRRT and acute metabolic encephalopathy/ICU delirium.  She was stabilized and transferred to [facility] on 1/17.  Subsequent hospital course complicated by lower GI bleeding with acute blood loss anemia-requiring transfer back to the ICU.  Ultimately she was found to have large ulcers in her entire colon.  She required multiple units of PRBC transfusion.  She was transferred back to the [facility] service on 1/24.  Significant events: 12/31>> admit to ICU-septic shock-intubated-required pressors stool studies/COVID/RSV/flu negative.  Blood cultures positive for strep 01/3 CRRT 01/5>> TEE done with no evidence of vegetations 01/11>>extubated, transferred from [facility] for iHD 01/17>> transfer to [facility] services 01/18>> developed lower GI bleeding-GI consulted-PRBC transfused 01/18>> transfer back to the ICU 01/20>> colonoscopy with multiple ulcers in colon. 01/24>> transferred to [facility]  Significant studies: 12/30>> CT chest/abdomen/pelvis: Fluid throughout stomach/small bowel/colon-nonspecific 01/05>> CT chest/abdomen/pelvis: Persistent areas of wall thickening along the colon, patchy bilateral ill-defined lung opacities 01/19>> CT abdomen/pelvis: Diarrheal illness with fluid reaching the rectum, colonic wall thickening is improved.  Improving bilateral PNA.  Bilateral nephrolithiasis without obstruction. 01/19>> CT angio GI bleed: Limited evaluation because of oral contrast within the distal small  bowel/throughout colon-active GI bleeding could not be assessed. 01/21>> CT angio GI bleed: No active GI bleeding, multiple infectious/inflammatory colitis involving splenic flexure and ascending colon.  Significant microbiology data: 12/30>> blood culture: Streptococcus Infantarius 12/30>> stool C. difficile negative 12/30>> GI pathogen panel: Negative 01/02>> tracheal aspirate: No organisms 01/03>> blood culture: No growth 01/03>> blood culture: No growth 01/17>> blood culture: No growth  Procedures: 12/31-1/11>> ETT 1/05>> TEE: EF 60-65%-no vegetations 1/20>> colonoscopy: Blood in entire colon, multiple ulcers throughout the colon.  Consults: PCCM GI General surgery Nephrology ID  Subjective: Diarrhea overnight I had brown stools.  Continues to have abdominal pain.  Wants to eat grits.  Objective: Vitals: Blood pressure (!) 140/87, pulse (!) 105, temperature 97.8 F (36.6 C), temperature source Oral, resp. rate (!) 27, height 4\' 10"  (1.473 m), weight 74.9 kg, SpO2 96 %.   Exam: Gen Exam:Alert awake-not in any distress HEENT:atraumatic, normocephalic Chest: B/L clear to auscultation anteriorly CVS:S1S2 regular Abdomen:soft non tender, non distended Extremities:no edema Neurology: Non focal Skin: no rash  Pertinent Labs/Radiology:    Latest Ref Rng & Units 04/03/2022    4:26 AM 04/02/2022    3:09 AM 04/01/2022    6:36 PM  CBC  WBC 4.0 - 10.5 K/uL 13.3  14.0  14.2   Hemoglobin 12.0 - 15.0 g/dL 11.3  11.5  11.4   Hematocrit 36.0 - 46.0 % 35.0  34.9  33.0   Platelets 150 - 400 K/uL 413  379  360     Lab Results  Component Value Date   NA 137 04/05/2022   K 4.5 04/05/2022   CL 105 04/05/2022   CO2 19 (L) 04/05/2022      Assessment/Plan: Septic shock due to Streptococcus Infantarius bacteremia Multifocal  PNA-possibly ventilator associated PNA Sepsis physiology has resolved Had completed a course of antibiotics on 1/17-now back on Zosyn for severe colitis   TEE negative for vegetations  Acute toxic/metabolic encephalopathy ICU delirium Multifactorial etiology-ICU delirium/encephalopathy due to sepsis/AKI Much improved-awake/alert this morning. Maintain delirium precautions Continue Seroquel 300 nightly Continue vilazodone  AKI Multifactorial etiology-felt to be ischemic ATN from septic shock-possibly some component of infectious (strep) glomerulonephritis-and now possibly contrast related (CT angio)  Avoid nephrotoxic agents Nephrology following Defer decision to resume HD/CRRT per nephrology service  Shock liver Improved Trend LFTs periodically  Lower GI bleeding due to severe colitis with multiple colon ulcers Acute blood loss anemia Follow CBC trend (8 units PRBC from 1/18-1/21) CT angio without any localized bleeding Empirically on Zosyn since 1/20 Recent stool studies negative Pathology consistent with ischemic colitis per GI. GI has signed off-General surgery plans to repeat CT abdomen today Some diarrhea overnight but patient also has history of IBS and diarrhea is not unusual for her.  HTN BP stable Continue amlodipine  GERD PPI  Bipolar disorder/depression Complicated by encephalopathy Continue Seroquel/vilazodone  COPD Not in exacerbation-continue bronchodilators  OSA CPAP when able  Nutrition Status: Nutrition Problem: Inadequate oral intake Etiology: inability to eat Signs/Symptoms: NPO status (on vent) Interventions: Refer to RD note for recommendations  Obesity: Estimated body mass index is 34.51 kg/m as calculated from the following:   Height as of this encounter: 4\' 10"  (1.473 m).   Weight as of this encounter: 74.9 kg.   Code status:   Code Status: Full Code   DVT Prophylaxis: Place and maintain sequential compression device Start: 03/13/22 2117 SCDs Start: 03/10/22 0232   Family Communication: Son at bedside   Disposition Plan: Status is: Inpatient Remains inpatient appropriate  because: Severity of illness   Planned Discharge Destination:Skilled nursing facility   Diet: Diet Order             Diet clear liquid Room service appropriate? Yes; Fluid consistency: Thin  Diet effective now                     Antimicrobial agents: Anti-infectives (From admission, onward)    Start     Dose/Rate Route Frequency Ordered Stop   03/31/22 0600  piperacillin-tazobactam (ZOSYN) IVPB 2.25 g       See Hyperspace for full Linked Orders Report.   2.25 g 100 mL/hr over 30 Minutes Intravenous Every 8 hours 03/30/22 1801 04/05/22 2359   03/30/22 1845  piperacillin-tazobactam (ZOSYN) IVPB 3.375 g       See Hyperspace for full Linked Orders Report.   3.375 g 100 mL/hr over 30 Minutes Intravenous  Once 03/30/22 1801 03/30/22 1850   03/21/22 1000  cefTRIAXone (ROCEPHIN) 2 g in sodium chloride 0.9 % 100 mL IVPB        2 g 200 mL/hr over 30 Minutes Intravenous Every 24 hours 03/21/22 0740 03/28/22 0326   03/20/22 1200  piperacillin-tazobactam (ZOSYN) IVPB 3.375 g  Status:  Discontinued        3.375 g 12.5 mL/hr over 240 Minutes Intravenous Every 8 hours 03/20/22 0921 03/21/22 0740   03/19/22 1200  piperacillin-tazobactam (ZOSYN) IVPB 2.25 g  Status:  Discontinued        2.25 g 100 mL/hr over 30 Minutes Intravenous Every 8 hours 03/19/22 0931 03/20/22 0921   03/18/22 2000  piperacillin-tazobactam (ZOSYN) IVPB 3.375 g  Status:  Discontinued        3.375 g 12.5 mL/hr over 240  Minutes Intravenous Every 8 hours 03/18/22 1355 03/19/22 0931   03/16/22 1015  piperacillin-tazobactam (ZOSYN) IVPB 3.375 g  Status:  Discontinued        3.375 g 100 mL/hr over 30 Minutes Intravenous Every 6 hours 03/16/22 1007 03/18/22 1355   03/16/22 1000  piperacillin-tazobactam (ZOSYN) IVPB 3.375 g  Status:  Discontinued        3.375 g 12.5 mL/hr over 240 Minutes Intravenous Every 8 hours 03/16/22 0949 03/16/22 1007   03/12/22 1800  ampicillin (OMNIPEN) 2 g in sodium chloride 0.9 % 100 mL IVPB   Status:  Discontinued        2 g 300 mL/hr over 20 Minutes Intravenous Every 6 hours 03/12/22 1629 03/16/22 0919   03/12/22 1200  ampicillin (OMNIPEN) 2 g in sodium chloride 0.9 % 100 mL IVPB  Status:  Discontinued        2 g 300 mL/hr over 20 Minutes Intravenous 2 times daily 03/12/22 0945 03/12/22 1629   03/12/22 0600  vancomycin (VANCOREADY) IVPB 500 mg/100 mL  Status:  Discontinued        500 mg 100 mL/hr over 60 Minutes Intravenous Every 48 hours 03/11/22 2256 03/11/22 2307   03/11/22 2344  vancomycin variable dose per unstable renal function (pharmacist dosing)  Status:  Discontinued         Does not apply See admin instructions 03/11/22 2344 03/12/22 0945   03/11/22 2315  vancomycin (VANCOREADY) IVPB 1750 mg/350 mL  Status:  Discontinued        1,750 mg 175 mL/hr over 120 Minutes Intravenous  Once 03/11/22 2220 03/11/22 2250   03/10/22 1200  cefTRIAXone (ROCEPHIN) 2 g in sodium chloride 0.9 % 100 mL IVPB  Status:  Discontinued        2 g 200 mL/hr over 30 Minutes Intravenous Every 24 hours 03/10/22 0934 03/12/22 0945   03/10/22 0645  vancomycin (VANCOREADY) IVPB 1500 mg/300 mL        1,500 mg 150 mL/hr over 120 Minutes Intravenous  Once 03/10/22 0547 03/10/22 0848   03/10/22 0555  vancomycin variable dose per unstable renal function (pharmacist dosing)  Status:  Discontinued         Does not apply See admin instructions 03/10/22 0555 03/10/22 0931   03/10/22 0400  ceFEPIme (MAXIPIME) 2 g in sodium chloride 0.9 % 100 mL IVPB  Status:  Discontinued        2 g 200 mL/hr over 30 Minutes Intravenous Every 24 hours 03/10/22 0303 03/10/22 0931   03/10/22 0345  metroNIDAZOLE (FLAGYL) IVPB 500 mg  Status:  Discontinued        500 mg 100 mL/hr over 60 Minutes Intravenous Every 12 hours 03/10/22 0246 03/10/22 0931        MEDICATIONS: Scheduled Meds:  sodium chloride   Intravenous Once   amLODipine  10 mg Oral Daily   arformoterol  15 mcg Nebulization BID   budesonide (PULMICORT)  nebulizer solution  0.5 mg Nebulization BID   Chlorhexidine Gluconate Cloth  6 each Topical Daily   darbepoetin (ARANESP) injection - DIALYSIS  40 mcg Subcutaneous Q Fri-1800   feeding supplement  1 Container Oral TID BM   insulin aspart  0-15 Units Subcutaneous Q4H   multivitamin  1 tablet Oral QHS   nortriptyline  50 mg Oral QHS   mouth rinse  15 mL Mouth Rinse 4 times per day   pantoprazole (PROTONIX) IV  40 mg Intravenous Q24H   QUEtiapine  300 mg Oral  QHS   revefenacin  175 mcg Nebulization Daily   sodium chloride flush  10-40 mL Intracatheter Q12H   Vilazodone HCl  40 mg Oral Daily   Continuous Infusions:  sodium chloride Stopped (04/03/22 0928)   sodium chloride 10 mL (04/01/22 1039)   dextrose 5 % and 0.45% NaCl Stopped (03/31/22 0730)   lactated ringers 75 mL/hr at 04/05/22 0654   piperacillin-tazobactam (ZOSYN)  IV 100 mL/hr at 04/05/22 0654   PRN Meds:.sodium chloride, acetaminophen, albuterol, docusate, haloperidol lactate, labetalol, lip balm, LORazepam, mineral oil-hydrophilic petrolatum, ondansetron (ZOFRAN) IV, mouth rinse, polyethylene glycol, promethazine, sodium chloride flush   I have personally reviewed following labs and imaging studies  LABORATORY DATA: CBC: Recent Labs  Lab 03/29/22 2207 03/30/22 0349 03/31/22 0620 03/31/22 2150 04/01/22 0320 04/01/22 1052 04/01/22 1836 04/02/22 0309 04/03/22 0426  WBC 13.9*   < > 14.9*   < > 12.7* 15.2* 14.2* 14.0* 13.3*  NEUTROABS 11.3*  --  12.4*  --   --   --   --   --   --   HGB 6.8*   < > 10.4*   < > 11.6* 13.1 11.4* 11.5* 11.3*  HCT 19.7*   < > 29.9*   < > 34.2* 39.3 33.0* 34.9* 35.0*  MCV 87.9   < > 88.2   < > 88.4 88.7 87.3 89.5 90.7  PLT 224   < > 249   < > 312 382 360 379 413*   < > = values in this interval not displayed.     Basic Metabolic Panel: Recent Labs  Lab 03/31/22 2150 04/01/22 0320 04/02/22 0309 04/03/22 0426 04/04/22 0358 04/05/22 0446  NA 141 142 137 140 137 137  K 4.4 4.5 4.4  4.5 4.2 4.5  CL 109 111 106 110 108 105  CO2 17* 18* 16* 20* 20* 19*  GLUCOSE 105* 93 90 78 92 93  BUN 50* 49* 45* 40* 35* 29*  CREATININE 4.20* 4.17* 4.24* 4.09* 4.20* 3.92*  CALCIUM 8.5* 8.2* 8.5* 8.7* 8.5* 8.6*  MG 1.4*  --   --   --   --   --   PHOS  --  9.0* 6.8* 7.0* 6.3* 5.3*     GFR: Estimated Creatinine Clearance: 13.8 mL/min (A) (by C-G formula based on SCr of 3.92 mg/dL (H)).  Liver Function Tests: Recent Labs  Lab 04/01/22 0320 04/02/22 0309 04/03/22 0426 04/04/22 0358 04/05/22 0446  ALBUMIN 1.7* 1.8* 1.7* 1.8* 1.8*    No results for input(s): "LIPASE", "AMYLASE" in the last 168 hours. No results for input(s): "AMMONIA" in the last 168 hours.  Coagulation Profile: Recent Labs  Lab 03/29/22 1346 03/30/22 2215 03/31/22 0620  INR 1.3* 1.4* 1.4*     Cardiac Enzymes: No results for input(s): "CKTOTAL", "CKMB", "CKMBINDEX", "TROPONINI" in the last 168 hours.  BNP (last 3 results) No results for input(s): "PROBNP" in the last 8760 hours.  Lipid Profile: No results for input(s): "CHOL", "HDL", "LDLCALC", "TRIG", "CHOLHDL", "LDLDIRECT" in the last 72 hours.  Thyroid Function Tests: No results for input(s): "TSH", "T4TOTAL", "FREET4", "T3FREE", "THYROIDAB" in the last 72 hours.  Anemia Panel: No results for input(s): "VITAMINB12", "FOLATE", "FERRITIN", "TIBC", "IRON", "RETICCTPCT" in the last 72 hours.  Urine analysis:    Component Value Date/Time   COLORURINE YELLOW 03/21/2022 1422   APPEARANCEUR CLOUDY (A) 03/21/2022 1422   APPEARANCEUR Clear 12/25/2021 1409   LABSPEC 1.014 03/21/2022 1422   LABSPEC 1.010 04/20/2012 1725   PHURINE 7.0 03/21/2022  [place] (A) 03/21/2022 1422   GLUCOSEU Negative 04/20/2012 1725   HGBUR MODERATE (A) 03/21/2022 1422   BILIRUBINUR NEGATIVE 03/21/2022 1422   BILIRUBINUR Negative 12/25/2021 1409   BILIRUBINUR Negative 04/20/2012 1725   KETONESUR NEGATIVE 03/21/2022 1422   PROTEINUR 100 (A) 03/21/2022 1422    NITRITE NEGATIVE 03/21/2022 1422   LEUKOCYTESUR SMALL (A) 03/21/2022 1422   LEUKOCYTESUR Negative 04/20/2012 1725    Sepsis Labs: Lactic Acid, Venous    Component Value Date/Time   LATICACIDVEN 1.8 03/10/2022 0049    MICROBIOLOGY: Recent Results (from the past 240 hour(s))  Culture, blood (Routine X 2) w Reflex to ID Panel     Status: None   Collection Time: 03/27/22 11:05 PM   Specimen: BLOOD LEFT ARM  Result Value Ref Range Status   Specimen Description BLOOD LEFT ARM  Final   Special Requests   Final    BOTTLES DRAWN AEROBIC AND ANAEROBIC Blood Culture adequate volume   Culture   Final    NO GROWTH 5 DAYS Performed at [facility], [place] [address] [place], [place] [address]    Report Status 04/01/2022 FINAL  Final    RADIOLOGY STUDIES/RESULTS: No results found.   LOS: 26 days   Oren Binet, MD  [facility]    To contact the attending provider between 7A-7P or the covering provider during after hours 7P-7A, please log into the web site [email or web address] and access using universal [facility] password for that web site. If you do not have the password, please call the hospital operator.  04/05/2022, 11:30 AM

## 2022-04-05 NOTE — Progress Notes (Signed)
[facility] Progress Note  6 Days Post-Op  Subjective: CC-  No significant changes. Reports ongoing R>L sided abdominal pain "like a fire pit". She is having loose stools (she says this is normal for her with her IBS with constipation). Denies blood in her stools. Reports nausea and dry heaves. States she is tolerating small sips of diet soda.   No cbc this morning.  Objective: Vital signs in last 24 hours: Temp:  [97.6 F (36.4 C)-98.5 F (36.9 C)] 97.8 F (36.6 C) (01/25 2300) Pulse Rate:  [93-115] 105 (01/26 0600) Resp:  [16-27] 27 (01/26 0600) BP: (140-179)/(78-118) 140/87 (01/26 0700) SpO2:  [91 %-99 %] 96 % (01/26 0600) FiO2 (%):  [21 %] 21 % (01/25 2300) Last BM Date : 04/04/22  Intake/Output from previous day: 01/25 0701 - 01/26 0700 In: 654650.3 [P.O.:1000; I.V.:1258.9; Blood:370685.6; IV Piggyback:399.9] Out: 7 [Urine:2; Stool:5] Intake/Output this shift: No intake/output data recorded.  PE: General: Alert, NAD Heart: tachycardic low 100s Lungs: Respiratory effort nonlabored Abd: soft, generalized ttp without peritonitis, nondistended  Lab Results:  Recent Labs    04/03/22 0426  WBC 13.3*  HGB 11.3*  HCT 35.0*  PLT 413*   BMET Recent Labs    04/04/22 0358 04/05/22 0446  NA 137 137  K 4.2 4.5  CL 108 105  CO2 20* 19*  GLUCOSE 92 93  BUN 35* 29*  CREATININE 4.20* 3.92*  CALCIUM 8.5* 8.6*   PT/INR No results for input(s): "LABPROT", "INR" in the last 72 hours. CMP     Component Value Date/Time   NA 137 04/05/2022 0446   NA 137 04/20/2012 1725   K 4.5 04/05/2022 0446   K 4.2 04/20/2012 1725   CL 105 04/05/2022 0446   CL 106 04/20/2012 1725   CO2 19 (L) 04/05/2022 0446   CO2 24 04/20/2012 1725   GLUCOSE 93 04/05/2022 0446   GLUCOSE 97 04/20/2012 1725   BUN 29 (H) 04/05/2022 0446   BUN 14 04/20/2012 1725   CREATININE 3.92 (H) 04/05/2022 0446   CREATININE 0.83 09/30/2019 1057   CALCIUM 8.6 (L) 04/05/2022 0446   CALCIUM  9.2 04/20/2012 1725   PROT 5.6 (L) 03/26/2022 0734   PROT 7.9 04/20/2012 1725   ALBUMIN 1.8 (L) 04/05/2022 0446   ALBUMIN 4.0 04/20/2012 1725   AST 24 03/26/2022 0734   AST 26 04/20/2012 1725   ALT 27 03/26/2022 0734   ALT 39 04/20/2012 1725   ALKPHOS 94 03/26/2022 0734   ALKPHOS 123 04/20/2012 1725   BILITOT 0.4 03/26/2022 0734   BILITOT 0.5 04/20/2012 1725   GFRNONAA 13 (L) 04/05/2022 0446   GFRNONAA 81 09/30/2019 1057   GFRAA 93 09/30/2019 1057   Lipase     Component Value Date/Time   LIPASE 88 (H) 03/09/2022 2204       Studies/Results: No results found.  Anti-infectives: Anti-infectives (From admission, onward)    Start     Dose/Rate Route Frequency Ordered Stop   03/31/22 0600  piperacillin-tazobactam (ZOSYN) IVPB 2.25 g       See Hyperspace for full Linked Orders Report.   2.25 g 100 mL/hr over 30 Minutes Intravenous Every 8 hours 03/30/22 1801 04/05/22 2359   03/30/22 1845  piperacillin-tazobactam (ZOSYN) IVPB 3.375 g       See Hyperspace for full Linked Orders Report.   3.375 g 100 mL/hr over 30 Minutes Intravenous  Once 03/30/22 1801 03/30/22 1850   03/21/22 1000  cefTRIAXone (ROCEPHIN) 2 g in sodium chloride  0.9 % 100 mL IVPB        2 g 200 mL/hr over 30 Minutes Intravenous Every 24 hours 03/21/22 0740 03/28/22 0326   03/20/22 1200  piperacillin-tazobactam (ZOSYN) IVPB 3.375 g  Status:  Discontinued        3.375 g 12.5 mL/hr over 240 Minutes Intravenous Every 8 hours 03/20/22 0921 03/21/22 0740   03/19/22 1200  piperacillin-tazobactam (ZOSYN) IVPB 2.25 g  Status:  Discontinued        2.25 g 100 mL/hr over 30 Minutes Intravenous Every 8 hours 03/19/22 0931 03/20/22 0921   03/18/22 2000  piperacillin-tazobactam (ZOSYN) IVPB 3.375 g  Status:  Discontinued        3.375 g 12.5 mL/hr over 240 Minutes Intravenous Every 8 hours 03/18/22 1355 03/19/22 0931   03/16/22 1015  piperacillin-tazobactam (ZOSYN) IVPB 3.375 g  Status:  Discontinued        3.375 g 100  mL/hr over 30 Minutes Intravenous Every 6 hours 03/16/22 1007 03/18/22 1355   03/16/22 1000  piperacillin-tazobactam (ZOSYN) IVPB 3.375 g  Status:  Discontinued        3.375 g 12.5 mL/hr over 240 Minutes Intravenous Every 8 hours 03/16/22 0949 03/16/22 1007   03/12/22 1800  ampicillin (OMNIPEN) 2 g in sodium chloride 0.9 % 100 mL IVPB  Status:  Discontinued        2 g 300 mL/hr over 20 Minutes Intravenous Every 6 hours 03/12/22 1629 03/16/22 0919   03/12/22 1200  ampicillin (OMNIPEN) 2 g in sodium chloride 0.9 % 100 mL IVPB  Status:  Discontinued        2 g 300 mL/hr over 20 Minutes Intravenous 2 times daily 03/12/22 0945 03/12/22 1629   03/12/22 0600  vancomycin (VANCOREADY) IVPB 500 mg/100 mL  Status:  Discontinued        500 mg 100 mL/hr over 60 Minutes Intravenous Every 48 hours 03/11/22 2256 03/11/22 2307   03/11/22 2344  vancomycin variable dose per unstable renal function (pharmacist dosing)  Status:  Discontinued         Does not apply See admin instructions 03/11/22 2344 03/12/22 0945   03/11/22 2315  vancomycin (VANCOREADY) IVPB 1750 mg/350 mL  Status:  Discontinued        1,750 mg 175 mL/hr over 120 Minutes Intravenous  Once 03/11/22 2220 03/11/22 2250   03/10/22 1200  cefTRIAXone (ROCEPHIN) 2 g in sodium chloride 0.9 % 100 mL IVPB  Status:  Discontinued        2 g 200 mL/hr over 30 Minutes Intravenous Every 24 hours 03/10/22 0934 03/12/22 0945   03/10/22 0645  vancomycin (VANCOREADY) IVPB 1500 mg/300 mL        1,500 mg 150 mL/hr over 120 Minutes Intravenous  Once 03/10/22 0547 03/10/22 0848   03/10/22 0555  vancomycin variable dose per unstable renal function (pharmacist dosing)  Status:  Discontinued         Does not apply See admin instructions 03/10/22 0555 03/10/22 0931   03/10/22 0400  ceFEPIme (MAXIPIME) 2 g in sodium chloride 0.9 % 100 mL IVPB  Status:  Discontinued        2 g 200 mL/hr over 30 Minutes Intravenous Every 24 hours 03/10/22 0303 03/10/22 0931   03/10/22  0345  metroNIDAZOLE (FLAGYL) IVPB 500 mg  Status:  Discontinued        500 mg 100 mL/hr over 60 Minutes Intravenous Every 12 hours 03/10/22 0246 03/10/22 0931  Assessment/Plan GI bleeding secondary to colitis with ulcerations - s/p colonoscopy, Biopsies negative for acute infection or inflammatory bowel disease   - s/p transfusion 6 units from 1/20-1/21 - CTA 1/21 without localized bleeding  - surgery would be last line for this patient without localized source of bleeding, TAC with risk of continued bleeding if ulcerations in small bowel continue to bleed. TAC would also result in ileostomy with high risk of dehydration from this.  - H/h has remained stable and no longer having frankly bloody bowel movements. Bleeding seems to have resolved without surgery but she has ongoing abdominal pain, nausea, and poor oral intake. Continue medical management. CT abdomen pelvis today to further evaluate.   FEN: CLD, IVF - defer diet advancement to primary   VTE: SCDs, no chemical prophylaxis in setting of GI bleed ID: [email or web address]    I reviewed last 24 h vitals and pain scores, last 48 h intake and output, and last 24 h labs and trends.    LOS: 26 days    [facility] 04/05/2022, 9:19 AM Please see Amion for pager number during day hours 7:00am-4:30pm

## 2022-04-05 NOTE — Progress Notes (Signed)
Patient ID: Darlene Barber, female   DOB: 05/03/1965, 57 y.o.   MRN: [id number] S: Patient seen and examined bedside, no complaints, no acute events. She's upset that she can't eat grits. She reports having diarrhea overnight (brown). O:BP (!) 140/87 (BP Location: Left Arm)   Pulse (!) 105   Temp 97.8 F (36.6 C) (Oral)   Resp (!) 27   Ht 4\' 10"  (1.473 m)   Wt 74.9 kg   SpO2 98%   BMI 34.51 kg/m   Intake/Output Summary (Last 24 hours) at 04/05/2022 1243 Last data filed at 04/05/2022 1030 Gross per 24 hour  Intake 373344.37 ml  Output 381 ml  Net 372963.37 ml    Intake/Output: I/O last 3 completed shifts: In: 102725.3 [P.O.:1000; I.V.:1258.9; Blood:370685.6; IV Piggyback:399.9] Out: 7 [Urine:2; Stool:5]  Intake/Output this shift:  Total I/O In: -  Out: 375 [Urine:375] Weight change:  Gen: NAD CVS: RRR Resp: CTA, normal WOB Abd: +BS, soft, NT/ND Ext: no edema Neuro: awake, alert  Recent Labs  Lab 03/30/22 0348 03/31/22 0620 03/31/22 2150 04/01/22 0320 04/02/22 0309 04/03/22 0426 04/04/22 0358 04/05/22 0446  NA 137 134* 141 142 137 140 137 137  K 5.3* 4.7 4.4 4.5 4.4 4.5 4.2 4.5  CL 111 109 109 111 106 110 108 105  CO2 14* 11* 17* 18* 16* 20* 20* 19*  GLUCOSE 113* 118* 105* 93 90 78 92 93  BUN 63* 53* 50* 49* 45* 40* 35* 29*  CREATININE 4.24* 3.93* 4.20* 4.17* 4.24* 4.09* 4.20* 3.92*  ALBUMIN <1.5* 1.8*  --  1.7* 1.8* 1.7* 1.8* 1.8*  CALCIUM 8.5* 8.3* 8.5* 8.2* 8.5* 8.7* 8.5* 8.6*  PHOS 8.0* 7.1*  --  9.0* 6.8* 7.0* 6.3* 5.3*   Liver Function Tests: Recent Labs  Lab 04/03/22 0426 04/04/22 0358 04/05/22 0446  ALBUMIN 1.7* 1.8* 1.8*   No results for input(s): "LIPASE", "AMYLASE" in the last 168 hours. No results for input(s): "AMMONIA" in the last 168 hours. CBC: Recent Labs  Lab 03/29/22 2207 03/30/22 0349 03/31/22 0620 03/31/22 2150 04/01/22 0320 04/01/22 1052 04/01/22 1836 04/02/22 0309 04/03/22 0426  WBC 13.9*   < > 14.9*   < > 12.7* 15.2*  14.2* 14.0* 13.3*  NEUTROABS 11.3*  --  12.4*  --   --   --   --   --   --   HGB 6.8*   < > 10.4*   < > 11.6* 13.1 11.4* 11.5* 11.3*  HCT 19.7*   < > 29.9*   < > 34.2* 39.3 33.0* 34.9* 35.0*  MCV 87.9   < > 88.2   < > 88.4 88.7 87.3 89.5 90.7  PLT 224   < > 249   < > 312 382 360 379 413*   < > = values in this interval not displayed.   Cardiac Enzymes: No results for input(s): "CKTOTAL", "CKMB", "CKMBINDEX", "TROPONINI" in the last 168 hours. CBG: Recent Labs  Lab 04/04/22 1652 04/04/22 2008 04/04/22 2344 04/05/22 0823 04/05/22 1132  GLUCAP 114* 93 90 93 97    Iron Studies: No results for input(s): "IRON", "TIBC", "TRANSFERRIN", "FERRITIN" in the last 72 hours. Studies/Results: No results found.  sodium chloride   Intravenous Once   amLODipine  10 mg Oral Daily   arformoterol  15 mcg Nebulization BID   budesonide (PULMICORT) nebulizer solution  0.5 mg Nebulization BID   Chlorhexidine Gluconate Cloth  6 each Topical Daily   darbepoetin (ARANESP) injection - DIALYSIS  40 mcg  Subcutaneous Q Fri-1800   feeding supplement  1 Container Oral TID BM   insulin aspart  0-15 Units Subcutaneous Q4H   multivitamin  1 tablet Oral QHS   nortriptyline  50 mg Oral QHS   mouth rinse  15 mL Mouth Rinse 4 times per day   pantoprazole (PROTONIX) IV  40 mg Intravenous Q24H   QUEtiapine  300 mg Oral QHS   revefenacin  175 mcg Nebulization Daily   sodium chloride flush  10-40 mL Intracatheter Q12H   Vilazodone HCl  40 mg Oral Daily    BMET    Component Value Date/Time   NA 137 04/05/2022 0446   NA 137 04/20/2012 1725   K 4.5 04/05/2022 0446   K 4.2 04/20/2012 1725   CL 105 04/05/2022 0446   CL 106 04/20/2012 1725   CO2 19 (L) 04/05/2022 0446   CO2 24 04/20/2012 1725   GLUCOSE 93 04/05/2022 0446   GLUCOSE 97 04/20/2012 1725   BUN 29 (H) 04/05/2022 0446   BUN 14 04/20/2012 1725   CREATININE 3.92 (H) 04/05/2022 0446   CREATININE 0.83 09/30/2019 1057   CALCIUM 8.6 (L) 04/05/2022 0446    CALCIUM 9.2 04/20/2012 1725   GFRNONAA 13 (L) 04/05/2022 0446   GFRNONAA 81 09/30/2019 1057   GFRAA 93 09/30/2019 1057   CBC    Component Value Date/Time   WBC 13.3 (H) 04/03/2022 0426   RBC 3.86 (L) 04/03/2022 0426   HGB 11.3 (L) 04/03/2022 0426   HGB 16.7 (H) 04/20/2012 1725   HCT 35.0 (L) 04/03/2022 0426   HCT 50.0 (H) 04/20/2012 1725   PLT 413 (H) 04/03/2022 0426   PLT 331 04/20/2012 1725   MCV 90.7 04/03/2022 0426   MCV 98 04/20/2012 1725   MCH 29.3 04/03/2022 0426   MCHC 32.3 04/03/2022 0426   RDW 17.2 (H) 04/03/2022 0426   RDW 14.2 04/20/2012 1725   LYMPHSABS 1.3 03/31/2022 0620   LYMPHSABS 2.7 08/27/2011 0558   MONOABS 0.3 03/31/2022 0620   MONOABS 0.5 08/27/2011 0558   EOSABS 0.4 03/31/2022 0620   EOSABS 0.5 08/27/2011 0558   BASOSABS 0.0 03/31/2022 0620   BASOSABS 0.1 08/27/2011 0558    Assessment/ Plan: AKI - crt of 0.8 in August of 2023, bland UA in October of 23.  Now with AKI , proteinuria and hematuria but also pyuria.  Seems most likely to be AKI from ATN in the setting of bacteremia. Given the proteinuria and hematuria concerning for GN, some serologies were sent and were generally negative. 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 SP CRRT 1/02- 1/11. Tx'd to [facility] 1/11. Her last HD session was 03/25/22 and right femoral HD catheter removed 03/26/22. Unfortunately she had another CT angio 1/21 so will need to continue to follow UOP and daily labs.  Will likely require colectomy which could have further complicated her renal recovery however not a surgical candidate. Cr relatively down to 3.9 (has been stable in the 4's since d/c'ing HD).  Given diarrhea, will start her on fluids x 1 day: LR 75c/hr No indication to resume IHD at this time. I am hoping that her kidney function will turn around with time Nothing else to offer from a nephrology perspective. Will sign off. Please let us know once she is close to  discharge so we can arrange for a follow up appt at the office. Please call with any questions/concerns. ABLA with evidence of acute  GI bleed - pt with hematochezia last night and this morning.  Receiving blood transfusion.  Unfortunately colonoscopy and CT angio x 2 could not isolate location of GIB, now pointing to diffuse colonic bleeding due to diffuse ulcerations and colitis.  Surgery consulted for possible total colectomy however very high risk therefore not a surgical candidate Hyperkalemia - resolved HTN - on norvasc. Can consider coreg as the next line agent if needed Normocytic Anemia/ABLA  - deferring IV iron in setting of infection.  Aranesp 40 mcg every Thursday--will d/c given that her Hgb has been stable/at goal.  Hgb stable at 11.3 1/24 Strep bacteremia-  streptococcus infantarius, TEE negative, getting Rocephin thru 1/17. Colonoscopy when more stable per pmd.  Acute hypoxic respiratory failure - extubated on 1/10 H/o polysubstance abuse  Discussed with primary service.  Gean Quint, MD [facility]

## 2022-04-05 NOTE — Progress Notes (Signed)
Nutrition Follow-up  DOCUMENTATION CODES:   Obesity unspecified  INTERVENTION:   Continue Renal Multivitamin w/ minerals daily Discontinue Boost Breeze Advance diet as medically able per MD  NUTRITION DIAGNOSIS:   Inadequate oral intake related to inability to eat as evidenced by NPO status (on vent). - Progressing, pt on CLD  GOAL:   Patient will meet greater than or equal to 90% of their needs - Ongoing  MONITOR:   Diet advancement, Labs, Weight trends, I & O's  REASON FOR ASSESSMENT:   Consult Enteral/tube feeding initiation and management (trickle TF with recs)  ASSESSMENT:   57 yo female w/ pertinent PMH HTN, T2DM, OSA, urge incontinence, bipolar depression presents to ED on 12/30 w/ rash and sob. Found to have sepsis and acute hypoxemic respiratory failure.  12/30 - admit 01/01 - intubated, trickle TF started 01/03 - CRRT start 01/04 - TF advanced to goal rate 01/08 - CRRT stop 01/10 - CRRT restart 01/11 - extubated, CRRT stop, transferred to [facility] for iHD 01/12 - Cortrak placed (tip gastric) 01/16 - diet advanced to full liquids 01/17 - diet advanced to dysphagia 3 01/18 - TF changed to nocturnal (never started) 01/19 - clear liquids 01/20 - NPO, s/p colonoscopy showing large amount of blood and multiple ulcerations on the R side of the colon (no source identified), s/p EGD with no source of bleeding, Cortrak removed 01/21 - recurrent GI bleed 01/22 - diet advanced to clear liquids 01/23 - transferred to floor  Pt laying in bed. Reports that she has not ate much. Drinking diet sodas, fruit ice, and ice chips/water; occasionally will have some chicken broth. RD observed two Boost Breeze at bedside, reports that she does not like them. Pt really would like to have some grits. Discussed waiting for diet advancement.  Discussed ongoing poor PO due to current diet and ongoing nausea/vomiting. Endorses multiple episodes of diarrhea. Pt shares that at home she will  use probiotics at home to help with her diarrhea.   Discussed with MD about advancing diet, would like to wait until after CT scan.   Medications reviewed and include: NovoLog SSI, Rena-vit, Protonix, IV antibiotics Labs reviewed: Sodium 137, Potassium 4.5, Phosphorus 5.3, 24 hr CBGs 68-114  Diet Order:   Diet Order             Diet clear liquid Room service appropriate? Yes; Fluid consistency: Thin  Diet effective now                   EDUCATION NEEDS:   Not appropriate for education at this time  Skin:  Skin Assessment: Skin Integrity Issues: Skin Integrity Issues:: Other (Comment) Other: skin tear L knee, MASD groin/abd/inner thighs, MARSI bilateral buttocks  Last BM:  1/24  Height:  Ht Readings from Last 1 Encounters:  03/12/22 4\' 10"  (1.473 m)   Weight:  Wt Readings from Last 1 Encounters:  04/02/22 74.9 kg   Ideal Body Weight:  43.2 kg  BMI:  Body mass index is 34.51 kg/m.  Estimated Nutritional Needs:  Kcal:  1700-1900 Protein:  85-100 grams Fluid:  1L+UOP   Hermina Barters RD, LDN Clinical Dietitian See [facility] for contact information.

## 2022-04-06 ENCOUNTER — Inpatient Hospital Stay (HOSPITAL_COMMUNITY): Payer: Medicaid Other

## 2022-04-06 DIAGNOSIS — A419 Sepsis, unspecified organism: Secondary | ICD-10-CM | POA: Diagnosis not present

## 2022-04-06 DIAGNOSIS — R6521 Severe sepsis with septic shock: Secondary | ICD-10-CM | POA: Diagnosis not present

## 2022-04-06 LAB — MAGNESIUM: Magnesium: 1.6 mg/dL — ABNORMAL LOW (ref 1.7–2.4)

## 2022-04-06 LAB — RENAL FUNCTION PANEL
Albumin: 1.8 g/dL — ABNORMAL LOW (ref 3.5–5.0)
Anion gap: 8 (ref 5–15)
BUN: 30 mg/dL — ABNORMAL HIGH (ref 6–20)
CO2: 22 mmol/L (ref 22–32)
Calcium: 8.4 mg/dL — ABNORMAL LOW (ref 8.9–10.3)
Chloride: 107 mmol/L (ref 98–111)
Creatinine, Ser: 4.26 mg/dL — ABNORMAL HIGH (ref 0.44–1.00)
GFR, Estimated: 12 mL/min — ABNORMAL LOW (ref >60)
Glucose, Bld: 112 mg/dL — ABNORMAL HIGH (ref 70–99)
Phosphorus: 6.3 mg/dL — ABNORMAL HIGH (ref 2.5–4.6)
Potassium: 3.9 mmol/L (ref 3.5–5.1)
Sodium: 137 mmol/L (ref 135–145)

## 2022-04-06 LAB — CBC
HCT: 36.1 % (ref 36.0–46.0)
Hemoglobin: 12 g/dL (ref 12.0–15.0)
MCH: 30.3 pg (ref 26.0–34.0)
MCHC: 33.2 g/dL (ref 30.0–36.0)
MCV: 91.2 fL (ref 80.0–100.0)
Platelets: 388 10*3/uL (ref 150–400)
RBC: 3.96 MIL/uL (ref 3.87–5.11)
RDW: 16.5 % — ABNORMAL HIGH (ref 11.5–15.5)
WBC: 10.7 10*3/uL — ABNORMAL HIGH (ref 4.0–10.5)
nRBC: 0 % (ref 0.0–0.2)

## 2022-04-06 LAB — GLUCOSE, CAPILLARY
Glucose-Capillary: 102 mg/dL — ABNORMAL HIGH (ref 70–99)
Glucose-Capillary: 102 mg/dL — ABNORMAL HIGH (ref 70–99)
Glucose-Capillary: 104 mg/dL — ABNORMAL HIGH (ref 70–99)
Glucose-Capillary: 129 mg/dL — ABNORMAL HIGH (ref 70–99)
Glucose-Capillary: 94 mg/dL (ref 70–99)
Glucose-Capillary: 95 mg/dL (ref 70–99)
Glucose-Capillary: 98 mg/dL (ref 70–99)

## 2022-04-06 LAB — BRAIN NATRIURETIC PEPTIDE: B Natriuretic Peptide: 89.4 pg/mL (ref 0.0–100.0)

## 2022-04-06 LAB — C-REACTIVE PROTEIN: CRP: 2.6 mg/dL — ABNORMAL HIGH (ref <1.0)

## 2022-04-06 MED ORDER — MAGNESIUM SULFATE 4 GM/100ML IV SOLN
4.0000 g | Freq: Once | INTRAVENOUS | Status: AC
  Administered 2022-04-06: 4 g via INTRAVENOUS
  Filled 2022-04-06: qty 100

## 2022-04-06 MED ORDER — PIPERACILLIN-TAZOBACTAM IN DEX 2-0.25 GM/50ML IV SOLN
2.2500 g | Freq: Three times a day (TID) | INTRAVENOUS | Status: AC
  Administered 2022-04-06 – 2022-04-11 (×15): 2.25 g via INTRAVENOUS
  Filled 2022-04-06 (×16): qty 50

## 2022-04-06 NOTE — Progress Notes (Signed)
7 Days Post-Op   Subjective/Chief Complaint: Complains of pain like "fire ants are biting her all over her body"   Objective: Vital signs in last 24 hours: Temp:  [98.1 F (36.7 C)-98.5 F (36.9 C)] 98.1 F (36.7 C) (01/27 0755) Pulse Rate:  [95-105] 104 (01/27 0755) Resp:  [12-21] 14 (01/27 0700) BP: (138-152)/(92-105) 147/105 (01/27 0755) SpO2:  [100 %] 100 % (01/27 0755) FiO2 (%):  [21 %] 21 % (01/27 0545) Last BM Date : 04/04/22  Intake/Output from previous day: 01/26 0701 - 01/27 0700 In: 181.3 [I.V.:131.3; IV Piggyback:50] Out: 375 [Urine:375] Intake/Output this shift: Total I/O In: 100 [IV Piggyback:100] Out: -   General appearance: alert and cooperative Resp: clear to auscultation bilaterally Cardio: regular rate and rhythm GI: soft, mild to moderate tenderness more on right side. No guarding. No distension  Lab Results:  Recent Labs    04/05/22 1400 04/06/22 0330  WBC 11.5* 10.7*  HGB 12.5 12.0  HCT 38.9 36.1  PLT 423* 388   BMET Recent Labs    04/05/22 0446 04/06/22 0330  NA 137 137  K 4.5 3.9  CL 105 107  CO2 19* 22  GLUCOSE 93 112*  BUN 29* 30*  CREATININE 3.92* 4.26*  CALCIUM 8.6* 8.4*   PT/INR No results for input(s): "LABPROT", "INR" in the last 72 hours. ABG No results for input(s): "PHART", "HCO3" in the last 72 hours.  Invalid input(s): "PCO2", "PO2"  Studies/Results: No results found.  Anti-infectives: Anti-infectives (From admission, onward)    Start     Dose/Rate Route Frequency Ordered Stop   03/31/22 0600  piperacillin-tazobactam (ZOSYN) IVPB 2.25 g       See Hyperspace for full Linked Orders Report.   2.25 g 100 mL/hr over 30 Minutes Intravenous Every 8 hours 03/30/22 1801 04/06/22 0637   03/30/22 1845  piperacillin-tazobactam (ZOSYN) IVPB 3.375 g       See Hyperspace for full Linked Orders Report.   3.375 g 100 mL/hr over 30 Minutes Intravenous  Once 03/30/22 1801 03/30/22 1850   03/21/22 1000  cefTRIAXone  (ROCEPHIN) 2 g in sodium chloride 0.9 % 100 mL IVPB        2 g 200 mL/hr over 30 Minutes Intravenous Every 24 hours 03/21/22 0740 03/28/22 0326   03/20/22 1200  piperacillin-tazobactam (ZOSYN) IVPB 3.375 g  Status:  Discontinued        3.375 g 12.5 mL/hr over 240 Minutes Intravenous Every 8 hours 03/20/22 0921 03/21/22 0740   03/19/22 1200  piperacillin-tazobactam (ZOSYN) IVPB 2.25 g  Status:  Discontinued        2.25 g 100 mL/hr over 30 Minutes Intravenous Every 8 hours 03/19/22 0931 03/20/22 0921   03/18/22 2000  piperacillin-tazobactam (ZOSYN) IVPB 3.375 g  Status:  Discontinued        3.375 g 12.5 mL/hr over 240 Minutes Intravenous Every 8 hours 03/18/22 1355 03/19/22 0931   03/16/22 1015  piperacillin-tazobactam (ZOSYN) IVPB 3.375 g  Status:  Discontinued        3.375 g 100 mL/hr over 30 Minutes Intravenous Every 6 hours 03/16/22 1007 03/18/22 1355   03/16/22 1000  piperacillin-tazobactam (ZOSYN) IVPB 3.375 g  Status:  Discontinued        3.375 g 12.5 mL/hr over 240 Minutes Intravenous Every 8 hours 03/16/22 0949 03/16/22 1007   03/12/22 1800  ampicillin (OMNIPEN) 2 g in sodium chloride 0.9 % 100 mL IVPB  Status:  Discontinued        2  g 300 mL/hr over 20 Minutes Intravenous Every 6 hours 03/12/22 1629 03/16/22 0919   03/12/22 1200  ampicillin (OMNIPEN) 2 g in sodium chloride 0.9 % 100 mL IVPB  Status:  Discontinued        2 g 300 mL/hr over 20 Minutes Intravenous 2 times daily 03/12/22 0945 03/12/22 1629   03/12/22 0600  vancomycin (VANCOREADY) IVPB 500 mg/100 mL  Status:  Discontinued        500 mg 100 mL/hr over 60 Minutes Intravenous Every 48 hours 03/11/22 2256 03/11/22 2307   03/11/22 2344  vancomycin variable dose per unstable renal function (pharmacist dosing)  Status:  Discontinued         Does not apply See admin instructions 03/11/22 2344 03/12/22 0945   03/11/22 2315  vancomycin (VANCOREADY) IVPB 1750 mg/350 mL  Status:  Discontinued        1,750 mg 175 mL/hr over 120  Minutes Intravenous  Once 03/11/22 2220 03/11/22 2250   03/10/22 1200  cefTRIAXone (ROCEPHIN) 2 g in sodium chloride 0.9 % 100 mL IVPB  Status:  Discontinued        2 g 200 mL/hr over 30 Minutes Intravenous Every 24 hours 03/10/22 0934 03/12/22 0945   03/10/22 0645  vancomycin (VANCOREADY) IVPB 1500 mg/300 mL        1,500 mg 150 mL/hr over 120 Minutes Intravenous  Once 03/10/22 0547 03/10/22 0848   03/10/22 0555  vancomycin variable dose per unstable renal function (pharmacist dosing)  Status:  Discontinued         Does not apply See admin instructions 03/10/22 0555 03/10/22 0931   03/10/22 0400  ceFEPIme (MAXIPIME) 2 g in sodium chloride 0.9 % 100 mL IVPB  Status:  Discontinued        2 g 200 mL/hr over 30 Minutes Intravenous Every 24 hours 03/10/22 0303 03/10/22 0931   03/10/22 0345  metroNIDAZOLE (FLAGYL) IVPB 500 mg  Status:  Discontinued        500 mg 100 mL/hr over 60 Minutes Intravenous Every 12 hours 03/10/22 0246 03/10/22 0931       Assessment/Plan: s/p Procedure(s): COLONOSCOPY WITH PROPOFOL (N/A) BIOPSY ESOPHAGOGASTRODUODENOSCOPY (EGD) WITH PROPOFOL (N/A) Advance diet Planning for CT abd pel today GI bleeding secondary to colitis with ulcerations - s/p colonoscopy, Biopsies negative for acute infection or inflammatory bowel disease   - s/p transfusion 6 units from 1/20-1/21 - CTA 1/21 without localized bleeding  - surgery would be last line for this patient without localized source of bleeding, TAC with risk of continued bleeding if ulcerations in large bowel continue to bleed. TAC would also result in ileostomy with high risk of dehydration from this.  - H/h has remained stable and no longer having frankly bloody bowel movements. Bleeding seems to have resolved without surgery but she has ongoing abdominal pain, nausea, and poor oral intake. Continue medical management. CT abdomen pelvis today to further evaluate.   FEN: CLD, IVF - defer diet advancement to primary    VTE: SCDs, no chemical prophylaxis in setting of GI bleed ID: [email or web address]   LOS: 27 days    Autumn Messing III 04/06/2022

## 2022-04-06 NOTE — Progress Notes (Addendum)
PROGRESS NOTE        PATIENT DETAILS Name: Darlene Barber Age: 57 y.o. Sex: female Date of Birth: Jul 26, 1965 Admit Date: 03/10/2022 Admitting Physician Rigoberto Noel, MD AQT:[id number], Chrystie Nose, MD  Brief Summary: Patient is a 57 y.o.  female who was initially admitted to the ICU with septic shock-respiratory failure requiring pressors/intubation on initial presentation.  She was subsequently was found to have Streptococcus bacteremia.  Hospital course was complicated by AKI requiring CRRT and acute metabolic encephalopathy/ICU delirium.  She was stabilized and transferred to [facility] on 1/17.  Subsequent hospital course complicated by lower GI bleeding with acute blood loss anemia-requiring transfer back to the ICU.  Ultimately she was found to have large ulcers in her entire colon.  She required multiple units of PRBC transfusion.  She was [facility] to the [facility] service on 1/24.  Significant events: 12/31>> admit to ICU-septic shock-intubated-required pressors stool studies/COVID/RSV/flu negative.  Blood cultures positive for strep 01/3 CRRT 01/5>> TEE done with no evidence of vegetations 01/11>>extubated, transferred from [facility] for iHD 01/17>> transfer to [facility] services 01/18>> developed lower GI bleeding-GI consulted-PRBC transfused 01/18>> transfer back to the ICU 01/20>> colonoscopy with multiple ulcers in colon. 01/24>> transferred to [facility]  Significant studies: 12/30>> CT chest/abdomen/pelvis: Fluid throughout stomach/small bowel/colon-nonspecific 01/05>> CT chest/abdomen/pelvis: Persistent areas of wall thickening along the colon, patchy bilateral ill-defined lung opacities 01/19>> CT abdomen/pelvis: Diarrheal illness with fluid reaching the rectum, colonic wall thickening is improved.  Improving bilateral PNA.  Bilateral nephrolithiasis without obstruction. 01/19>> CT angio GI bleed: Limited evaluation because of oral contrast within the distal small  bowel/throughout colon-active GI bleeding could not be assessed. 01/21>> CT angio GI bleed: No active GI bleeding, multiple infectious/inflammatory colitis involving splenic flexure and ascending colon.  Significant microbiology data: 12/30>> blood culture: Streptococcus Infantarius 12/30>> stool C. difficile negative 12/30>> GI pathogen panel: Negative 01/02>> tracheal aspirate: No organisms 01/03>> blood culture: No growth 01/03>> blood culture: No growth 01/17>> blood culture: No growth  Procedures: 12/31-1/11>> ETT 1/05>> TEE: EF 60-65%-no vegetations 1/20>> colonoscopy: Blood in entire colon, multiple ulcers throughout the colon.  Consults: PCCM GI General surgery Nephrology ID  Subjective: Diarrhea overnight I had brown stools.  Continues to have abdominal pain.  Wants to eat grits.  Objective: Vitals: Blood pressure (!) 147/105, pulse (!) 104, temperature 98.1 F (36.7 C), temperature source Oral, resp. rate 14, height 4\' 10"  (1.473 m), weight 74.9 kg, SpO2 100 %.   Exam:  Awake Alert, No new F.N deficits, Normal affect [place].AT,PERRAL Supple Neck, No JVD,   Symmetrical Chest wall movement, Good air movement bilaterally, CTAB RRR,No Gallops, Rubs or new Murmurs,  +ve B.Sounds, Abd Soft, abdomen distended diffusely No Cyanosis, Clubbing or edema    Assessment/Plan:  Septic shock likely due to GI source, required intubation due to septic shock developed ventilator associated pneumonia, Streptococcus Infantarius bacteremia   Sepsis physiology has resolved Had completed a course of antibiotics on 1/17-now back on Zosyn for severe colitis, continue for now TEE negative for vegetations  Lower GI bleeding due to severe colitis with multiple colon ulcers Acute blood loss anemia Follow CBC trend (8 units PRBC from 1/18-1/21) CT angio without any localized bleeding Empirically on Zosyn since 1/20 Recent stool studies negative Pathology consistent with ischemic  colitis per GI. GI has signed off-General surgery plans to repeat CT abdomen  due for 04/06/2022, defer management of this issue to general surgery Some diarrhea overnight but patient also has history of IBS and diarrhea is not unusual for her.  Acute toxic/metabolic encephalopathy ICU delirium Multifactorial etiology-ICU delirium/encephalopathy due to sepsis/AKI Much improved-awake/alert this morning. Maintain delirium precautions Continue Seroquel 300 nightly Continue vilazodone  AKI Multifactorial etiology-felt to be ischemic ATN from septic shock-possibly some component of infectious (strep) glomerulonephritis-and now possibly contrast related (CT angio)  Avoid nephrotoxic agents Nephrology following Defer decision to resume HD/CRRT per nephrology service  Shock liver Improved Trend LFTs periodically  HTN BP stable Continue amlodipine  GERD PPI  Bipolar disorder/depression Complicated by encephalopathy Continue Seroquel/vilazodone  COPD Not in exacerbation-continue bronchodilators  Hypomagnesemia.  Replaced.    OSA CPAP when able  Nutrition Status: Nutrition Problem: Inadequate oral intake Etiology: inability to eat Signs/Symptoms: NPO status (on vent) Interventions: Refer to RD note for recommendations  Obesity: Estimated body mass index is 34.51 kg/m as calculated from the following:   Height as of this encounter: 4\' 10"  (1.473 m).   Weight as of this encounter: 74.9 kg.   Code status:   Code Status: Full Code   DVT Prophylaxis: Place and maintain sequential compression device Start: 03/13/22 2117 SCDs Start: 03/10/22 0232   Family Communication: Son at bedside by previous MD on 04/05/2022   Disposition Plan: Status is: Inpatient Remains inpatient appropriate because: Severity of illness   Planned Discharge Destination:Skilled nursing facility   Diet: Diet Order             Diet clear liquid Room service appropriate? Yes; Fluid consistency:  Thin  Diet effective now                    MEDICATIONS: Scheduled Meds:  amLODipine  10 mg Oral Daily   arformoterol  15 mcg Nebulization BID   budesonide (PULMICORT) nebulizer solution  0.5 mg Nebulization BID   Chlorhexidine Gluconate Cloth  6 each Topical Daily   insulin aspart  0-15 Units Subcutaneous Q4H   multivitamin  1 tablet Oral QHS   nortriptyline  50 mg Oral QHS   mouth rinse  15 mL Mouth Rinse 4 times per day   pantoprazole (PROTONIX) IV  40 mg Intravenous Q24H   QUEtiapine  300 mg Oral QHS   revefenacin  175 mcg Nebulization Daily   sodium bicarbonate  650 mg Oral BID   Vilazodone HCl  40 mg Oral Daily   Continuous Infusions:  sodium chloride 10 mL (04/01/22 1039)   dextrose 5 % and 0.45% NaCl Stopped (03/31/22 0730)   magnesium sulfate bolus IVPB 4 g (04/06/22 0856)   PRN Meds:.sodium chloride, acetaminophen, albuterol, docusate, haloperidol lactate, labetalol, lip balm, LORazepam, mineral oil-hydrophilic petrolatum, ondansetron (ZOFRAN) IV, mouth rinse, oxyCODONE-acetaminophen, polyethylene glycol, promethazine, sodium chloride flush   I have personally reviewed following labs and imaging studies  LABORATORY DATA:  Recent Labs  Lab 03/31/22 0620 03/31/22 2150 04/01/22 1836 04/02/22 0309 04/03/22 0426 04/05/22 1400 04/06/22 0330  WBC 14.9*   < > 14.2* 14.0* 13.3* 11.5* 10.7*  HGB 10.4*   < > 11.4* 11.5* 11.3* 12.5 12.0  HCT 29.9*   < > 33.0* 34.9* 35.0* 38.9 36.1  PLT 249   < > 360 379 413* 423* 388  MCV 88.2   < > 87.3 89.5 90.7 91.5 91.2  MCH 30.7   < > 30.2 29.5 29.3 29.4 30.3  MCHC 34.8   < > 34.5 33.0 32.3 32.1 33.2  RDW 17.2*   < > 17.4* 17.3* 17.2* 16.7* 16.5*  LYMPHSABS 1.3  --   --   --   --   --   --   MONOABS 0.3  --   --   --   --   --   --   EOSABS 0.4  --   --   --   --   --   --   BASOSABS 0.0  --   --   --   --   --   --    < > = values in this interval not displayed.    Recent Labs  Lab 03/30/22 2215 03/31/22 0620  03/31/22 0620 03/31/22 2150 04/01/22 0320 04/02/22 0309 04/03/22 0426 04/04/22 0358 04/05/22 0446 04/06/22 0330  NA  --  134*   < > 141   < > 137 140 137 137 137  K  --  4.7   < > 4.4   < > 4.4 4.5 4.2 4.5 3.9  CL  --  109   < > 109   < > 106 110 108 105 107  CO2  --  11*   < > 17*   < > 16* 20* 20* 19* 22  ANIONGAP  --  14   < > 15   < > 15 10 9 13 8   GLUCOSE  --  118*   < > 105*   < > 90 78 92 93 112*  BUN  --  53*   < > 50*   < > 45* 40* 35* 29* 30*  CREATININE  --  3.93*   < > 4.20*   < > 4.24* 4.09* 4.20* 3.92* 4.26*  ALBUMIN  --  1.8*  --   --    < > 1.8* 1.7* 1.8* 1.8* 1.8*  CRP  --   --   --   --   --   --   --   --   --  2.6*  INR 1.4* 1.4*  --   --   --   --   --   --   --   --   BNP  --   --   --   --   --   --   --   --   --  89.4  MG  --   --   --  1.4*  --   --   --   --   --  1.6*  CALCIUM  --  8.3*   < > 8.5*   < > 8.5* 8.7* 8.5* 8.6* 8.4*   < > = values in this interval not displayed.    MICROBIOLOGY: Recent Results (from the past 240 hour(s))  Culture, blood (Routine X 2) w Reflex to ID Panel     Status: None   Collection Time: 03/27/22 11:05 PM   Specimen: BLOOD LEFT ARM  Result Value Ref Range Status   Specimen Description BLOOD LEFT ARM  Final   Special Requests   Final    BOTTLES DRAWN AEROBIC AND ANAEROBIC Blood Culture adequate volume   Culture   Final    NO GROWTH 5 DAYS Performed at [facility], [address] [place], [place] [address]    Report Status 04/01/2022 FINAL  Final    RADIOLOGY STUDIES/RESULTS: No results found.   LOS: 27 days   Signature  -    Lala Lund M.D on 04/06/2022 at 10:14  AM   -  To page go to [email or web address]

## 2022-04-07 DIAGNOSIS — R6521 Severe sepsis with septic shock: Secondary | ICD-10-CM | POA: Diagnosis not present

## 2022-04-07 DIAGNOSIS — A419 Sepsis, unspecified organism: Secondary | ICD-10-CM | POA: Diagnosis not present

## 2022-04-07 LAB — CBC
HCT: 35.4 % — ABNORMAL LOW (ref 36.0–46.0)
Hemoglobin: 11.7 g/dL — ABNORMAL LOW (ref 12.0–15.0)
MCH: 30.1 pg (ref 26.0–34.0)
MCHC: 33.1 g/dL (ref 30.0–36.0)
MCV: 91 fL (ref 80.0–100.0)
Platelets: 346 10*3/uL (ref 150–400)
RBC: 3.89 MIL/uL (ref 3.87–5.11)
RDW: 16.6 % — ABNORMAL HIGH (ref 11.5–15.5)
WBC: 10.6 10*3/uL — ABNORMAL HIGH (ref 4.0–10.5)
nRBC: 0 % (ref 0.0–0.2)

## 2022-04-07 LAB — MAGNESIUM: Magnesium: 2.5 mg/dL — ABNORMAL HIGH (ref 1.7–2.4)

## 2022-04-07 LAB — RENAL FUNCTION PANEL
Albumin: 1.8 g/dL — ABNORMAL LOW (ref 3.5–5.0)
Anion gap: 9 (ref 5–15)
BUN: 27 mg/dL — ABNORMAL HIGH (ref 6–20)
CO2: 20 mmol/L — ABNORMAL LOW (ref 22–32)
Calcium: 8.4 mg/dL — ABNORMAL LOW (ref 8.9–10.3)
Chloride: 108 mmol/L (ref 98–111)
Creatinine, Ser: 4.33 mg/dL — ABNORMAL HIGH (ref 0.44–1.00)
GFR, Estimated: 11 mL/min — ABNORMAL LOW (ref >60)
Glucose, Bld: 88 mg/dL (ref 70–99)
Phosphorus: 6 mg/dL — ABNORMAL HIGH (ref 2.5–4.6)
Potassium: 4.1 mmol/L (ref 3.5–5.1)
Sodium: 137 mmol/L (ref 135–145)

## 2022-04-07 LAB — GLUCOSE, CAPILLARY
Glucose-Capillary: 88 mg/dL (ref 70–99)
Glucose-Capillary: 102 mg/dL — ABNORMAL HIGH (ref 70–99)

## 2022-04-07 LAB — C-REACTIVE PROTEIN: CRP: 2.5 mg/dL — ABNORMAL HIGH (ref <1.0)

## 2022-04-07 LAB — BRAIN NATRIURETIC PEPTIDE: B Natriuretic Peptide: 66.3 pg/mL (ref 0.0–100.0)

## 2022-04-07 MED ORDER — METRONIDAZOLE 500 MG/100ML IV SOLN
500.0000 mg | Freq: Two times a day (BID) | INTRAVENOUS | Status: DC
  Administered 2022-04-07 – 2022-04-12 (×10): 500 mg via INTRAVENOUS
  Filled 2022-04-07 (×10): qty 100

## 2022-04-07 MED ORDER — ACETAMINOPHEN 325 MG PO TABS: 650.0000 mg | ORAL_TABLET | Freq: Four times a day (QID) | ORAL | Status: DC | PRN

## 2022-04-07 NOTE — Progress Notes (Signed)
8 Days Post-Op   Subjective/Chief Complaint: Belly seems to be slightly better. Still having some bloody diarrhea. CT shows diffuse colitis   Objective: Vital signs in last 24 hours: Temp:  [97.3 F (36.3 C)-98.6 F (37 C)] 97.3 F (36.3 C) (01/28 0338) Pulse Rate:  [96-107] 96 (01/28 0338) Resp:  [14-22] 22 (01/28 0338) BP: (104-160)/(72-100) 152/92 (01/28 0338) SpO2:  [94 %-100 %] 94 % (01/27 2345) Last BM Date : 04/06/22  Intake/Output from previous day: 01/27 0701 - 01/28 0700 In: 150 [IV Piggyback:150] Out: -  Intake/Output this shift: Total I/O In: -  Out: 2 [Urine:1; Stool:1]  General appearance: alert and cooperative Resp: clear to auscultation bilaterally Cardio: regular rate and rhythm GI: soft, moderate diffuse tenderness seems improved from yesterday  Lab Results:  Recent Labs    04/06/22 0330 04/07/22 0440  WBC 10.7* 10.6*  HGB 12.0 11.7*  HCT 36.1 35.4*  PLT 388 346   BMET Recent Labs    04/06/22 0330 04/07/22 0440  NA 137 137  K 3.9 4.1  CL 107 108  CO2 22 20*  GLUCOSE 112* 88  BUN 30* 27*  CREATININE 4.26* 4.33*  CALCIUM 8.4* 8.4*   PT/INR No results for input(s): "LABPROT", "INR" in the last 72 hours. ABG No results for input(s): "PHART", "HCO3" in the last 72 hours.  Invalid input(s): "PCO2", "PO2"  Studies/Results: CT ABDOMEN PELVIS WO CONTRAST  Result Date: 04/06/2022 CLINICAL DATA:  Abdominal pain and nausea.  Blood in stool. EXAM: CT ABDOMEN AND PELVIS WITHOUT CONTRAST TECHNIQUE: Multidetector CT imaging of the abdomen and pelvis was performed following the standard protocol without IV contrast. RADIATION DOSE REDUCTION: This exam was performed according to the departmental dose-optimization program which includes automated exposure control, adjustment of the mA and/or kV according to patient size and/or use of iterative reconstruction technique. COMPARISON:  03/31/2022 FINDINGS: Lower chest: No acute findings. Hepatobiliary: No  mass visualized on this unenhanced exam. Prior cholecystectomy. No evidence of biliary obstruction. Pancreas: No mass or inflammatory process visualized on this unenhanced exam. Spleen:  Within normal limits in size. Adrenals/Urinary tract: Several tiny 1-2 mm bilateral renal calculi again seen. No evidence of ureteral calculi or hydronephrosis. Unremarkable unopacified urinary bladder. Stomach/Bowel: Moderate diffuse colonic wall thickening and pericolonic soft tissue stranding are new since previous study, consistent with diffuse colitis. Normal appendix visualized. Evidence of abscess or free fluid. Vascular/Lymphatic: No pathologically enlarged lymph nodes identified. No evidence of abdominal aortic aneurysm. Aortic atherosclerotic calcification incidentally noted. Reproductive:  No mass or other significant abnormality. Other:  None. Musculoskeletal:  No suspicious bone lesions identified. IMPRESSION: Moderate diffuse colitis likely infectious in etiology. No evidence of abscess. Bilateral nephrolithiasis. No evidence of ureteral calculi or hydronephrosis. Electronically Signed   By: Marlaine Hind M.D.   On: 04/06/2022 12:51    Anti-infectives: Anti-infectives (From admission, onward)    Start     Dose/Rate Route Frequency Ordered Stop   04/06/22 1115  piperacillin-tazobactam (ZOSYN) IVPB 2.25 g        2.25 g 100 mL/hr over 30 Minutes Intravenous Every 8 hours 04/06/22 1016 04/12/22 1359   03/31/22 0600  piperacillin-tazobactam (ZOSYN) IVPB 2.25 g       See Hyperspace for full Linked Orders Report.   2.25 g 100 mL/hr over 30 Minutes Intravenous Every 8 hours 03/30/22 1801 04/06/22 0637   03/30/22 1845  piperacillin-tazobactam (ZOSYN) IVPB 3.375 g       See Hyperspace for full Linked Orders Report.  3.375 g 100 mL/hr over 30 Minutes Intravenous  Once 03/30/22 1801 03/30/22 1850   03/21/22 1000  cefTRIAXone (ROCEPHIN) 2 g in sodium chloride 0.9 % 100 mL IVPB        2 g 200 mL/hr over 30 Minutes  Intravenous Every 24 hours 03/21/22 0740 03/28/22 0326   03/20/22 1200  piperacillin-tazobactam (ZOSYN) IVPB 3.375 g  Status:  Discontinued        3.375 g 12.5 mL/hr over 240 Minutes Intravenous Every 8 hours 03/20/22 0921 03/21/22 0740   03/19/22 1200  piperacillin-tazobactam (ZOSYN) IVPB 2.25 g  Status:  Discontinued        2.25 g 100 mL/hr over 30 Minutes Intravenous Every 8 hours 03/19/22 0931 03/20/22 0921   03/18/22 2000  piperacillin-tazobactam (ZOSYN) IVPB 3.375 g  Status:  Discontinued        3.375 g 12.5 mL/hr over 240 Minutes Intravenous Every 8 hours 03/18/22 1355 03/19/22 0931   03/16/22 1015  piperacillin-tazobactam (ZOSYN) IVPB 3.375 g  Status:  Discontinued        3.375 g 100 mL/hr over 30 Minutes Intravenous Every 6 hours 03/16/22 1007 03/18/22 1355   03/16/22 1000  piperacillin-tazobactam (ZOSYN) IVPB 3.375 g  Status:  Discontinued        3.375 g 12.5 mL/hr over 240 Minutes Intravenous Every 8 hours 03/16/22 0949 03/16/22 1007   03/12/22 1800  ampicillin (OMNIPEN) 2 g in sodium chloride 0.9 % 100 mL IVPB  Status:  Discontinued        2 g 300 mL/hr over 20 Minutes Intravenous Every 6 hours 03/12/22 1629 03/16/22 0919   03/12/22 1200  ampicillin (OMNIPEN) 2 g in sodium chloride 0.9 % 100 mL IVPB  Status:  Discontinued        2 g 300 mL/hr over 20 Minutes Intravenous 2 times daily 03/12/22 0945 03/12/22 1629   03/12/22 0600  vancomycin (VANCOREADY) IVPB 500 mg/100 mL  Status:  Discontinued        500 mg 100 mL/hr over 60 Minutes Intravenous Every 48 hours 03/11/22 2256 03/11/22 2307   03/11/22 2344  vancomycin variable dose per unstable renal function (pharmacist dosing)  Status:  Discontinued         Does not apply See admin instructions 03/11/22 2344 03/12/22 0945   03/11/22 2315  vancomycin (VANCOREADY) IVPB 1750 mg/350 mL  Status:  Discontinued        1,750 mg 175 mL/hr over 120 Minutes Intravenous  Once 03/11/22 2220 03/11/22 2250   03/10/22 1200  cefTRIAXone  (ROCEPHIN) 2 g in sodium chloride 0.9 % 100 mL IVPB  Status:  Discontinued        2 g 200 mL/hr over 30 Minutes Intravenous Every 24 hours 03/10/22 0934 03/12/22 0945   03/10/22 0645  vancomycin (VANCOREADY) IVPB 1500 mg/300 mL        1,500 mg 150 mL/hr over 120 Minutes Intravenous  Once 03/10/22 0547 03/10/22 0848   03/10/22 0555  vancomycin variable dose per unstable renal function (pharmacist dosing)  Status:  Discontinued         Does not apply See admin instructions 03/10/22 0555 03/10/22 0931   03/10/22 0400  ceFEPIme (MAXIPIME) 2 g in sodium chloride 0.9 % 100 mL IVPB  Status:  Discontinued        2 g 200 mL/hr over 30 Minutes Intravenous Every 24 hours 03/10/22 0303 03/10/22 0931   03/10/22 0345  metroNIDAZOLE (FLAGYL) IVPB 500 mg  Status:  Discontinued  500 mg 100 mL/hr over 60 Minutes Intravenous Every 12 hours 03/10/22 0246 03/10/22 0931       Assessment/Plan: s/p Procedure(s): COLONOSCOPY WITH PROPOFOL (N/A) BIOPSY ESOPHAGOGASTRODUODENOSCOPY (EGD) WITH PROPOFOL (N/A) Diffuse colitis.  Continue clears.  Continue IV zosyn. Consider adding flagyl GI bleeding secondary to colitis with ulcerations - s/p colonoscopy, Biopsies negative for acute infection or inflammatory bowel disease   - s/p transfusion 6 units from 1/20-1/21 - CTA 1/21 without localized bleeding  - surgery would be last line for this patient without localized source of bleeding, TAC with risk of continued bleeding if ulcerations in large bowel continue to bleed. TAC would also result in ileostomy with high risk of dehydration from this.  - H/h has remained stable and no longer having frankly bloody bowel movements. Bleeding seems to have resolved without surgery but she has ongoing abdominal pain, nausea, and poor oral intake. Continue medical management. CT abdomen pelvis today to further evaluate.   FEN: CLD, IVF - defer diet advancement to primary   VTE: SCDs, no chemical prophylaxis in setting of GI  bleed ID: [email or web address]   LOS: 28 days    Autumn Messing III 04/07/2022

## 2022-04-07 NOTE — Progress Notes (Signed)
PROGRESS NOTE        PATIENT DETAILS Name: Darlene Barber Age: 57 y.o. Sex: female Date of Birth: [birth year] Admit Date: 03/10/2022 Admitting Physician Rigoberto Noel, MD PJK:[id number], Chrystie Nose, MD  Brief Summary: Patient is a 57 y.o.  female who was initially admitted to the ICU with septic shock-respiratory failure requiring pressors/intubation on initial presentation.  She was subsequently was found to have Streptococcus bacteremia.  Hospital course was complicated by AKI requiring CRRT and acute metabolic encephalopathy/ICU delirium.  She was stabilized and transferred to [facility] on 1/17.  Subsequent hospital course complicated by lower GI bleeding with acute blood loss anemia-requiring transfer back to the ICU.  Ultimately she was found to have large ulcers in her entire colon.  She required multiple units of PRBC transfusion.  She was transferred back to the [facility] service on 1/24.  Significant events: 12/31>> admit to ICU-septic shock-intubated-required pressors stool studies/COVID/RSV/flu negative.  Blood cultures positive for strep 01/3 CRRT 01/5>> TEE done with no evidence of vegetations 01/11>>extubated, transferred from [facility] for iHD 01/17>> transfer to [facility] services 01/18>> developed lower GI bleeding-GI consulted-PRBC transfused 01/18>> transfer back to the ICU 01/20>> colonoscopy with multiple ulcers in colon. 01/24>> transferred to [facility]  Significant studies: 12/30>> CT chest/abdomen/pelvis: Fluid throughout stomach/small bowel/colon-nonspecific 01/05>> CT chest/abdomen/pelvis: Persistent areas of wall thickening along the colon, patchy bilateral ill-defined lung opacities 01/19>> CT abdomen/pelvis: Diarrheal illness with fluid reaching the rectum, colonic wall thickening is improved.  Improving bilateral PNA.  Bilateral nephrolithiasis without obstruction. 01/19>> CT angio GI bleed: Limited evaluation because of oral contrast within the distal small  bowel/throughout colon-active GI bleeding could not be assessed. 01/21>> CT angio GI bleed: No active GI bleeding, multiple infectious/inflammatory colitis involving splenic flexure and ascending colon.  Significant microbiology data: 12/30>> blood culture: Streptococcus Infantarius 12/30>> stool C. difficile negative 12/30>> GI pathogen panel: Negative 01/02>> tracheal aspirate: No organisms 01/03>> blood culture: No growth 01/03>> blood culture: No growth 01/17>> blood culture: No growth  Procedures: 12/31-1/11>> ETT 1/05>> TEE: EF 60-65%-no vegetations 1/20>> colonoscopy: Blood in entire colon, multiple ulcers throughout the colon.  Consults: PCCM GI General surgery Nephrology ID  Subjective: Patient in bed appears to be in no distress, no chest pain, abdominal pain is better today, still has loose stools, no focal weakness.  Objective: Vitals: Blood pressure (!) 152/92, pulse 96, temperature (!) 97.3 F (36.3 C), temperature source Oral, resp. rate (!) 22, height 4\' 10"  (1.473 m), weight 74.9 kg, SpO2 94 %.   Exam:  Awake Alert, No new F.N deficits, Normal affect [place].AT,PERRAL Supple Neck, No JVD,   Symmetrical Chest wall movement, Good air movement bilaterally, CTAB RRR,No Gallops, Rubs or new Murmurs,  +ve B.Sounds, Abd Soft, mild generalized tenderness No Cyanosis, Clubbing or edema     Assessment/Plan:  Septic shock likely due to GI source, required intubation due to septic shock developed ventilator associated pneumonia, Streptococcus Infantarius bacteremia   Sepsis physiology has resolved Had completed a course of antibiotics on 1/17-now back on Zosyn for severe colitis, continue for now TEE negative for vegetations  Lower GI bleeding due to severe colitis with multiple colon ulcers Acute blood loss anemia Follow CBC trend (8 units PRBC from 1/18-1/21) CT angio without any localized bleeding Empirically on Zosyn since 1/20 Recent stool studies  negative Underwent colonoscopy with biopsies obtained during colonoscopy showing  nonspecific inflammation, GI has signed off but thought that infectious versus ischemic colitis is in differential. Gen.surgery following, CT abdomen from 04/06/2022 noted with continued infectious/inflammatory colitis, case discussed with general surgery, no surgical plans but continue conservative management with bowel rest till abdominal pain much improved, continue antibiotics. Some diarrhea overnight but patient also has history of IBS and diarrhea is not unusual for her.  Acute toxic/metabolic encephalopathy ICU delirium Multifactorial etiology-ICU delirium/encephalopathy due to sepsis/AKI Much improved-awake/alert this morning. Maintain delirium precautions Continue Seroquel 300 nightly Continue vilazodone  AKI Multifactorial etiology-felt to be ischemic ATN from septic shock-possibly some component of infectious (strep) glomerulonephritis-and now possibly contrast related (CT angio)  Avoid nephrotoxic agents Nephrology following Defer decision to resume HD/CRRT per nephrology service  Shock liver Improved Trend LFTs periodically  HTN BP stable Continue amlodipine  GERD PPI  Bipolar disorder/depression Complicated by encephalopathy Continue Seroquel/vilazodone  COPD Not in exacerbation-continue bronchodilators  Hypomagnesemia.  Replaced.    OSA CPAP when able  Nutrition Status: Nutrition Problem: Inadequate oral intake Etiology: inability to eat Signs/Symptoms: NPO status (on vent) Interventions: Refer to RD note for recommendations  Obesity: Estimated body mass index is 34.51 kg/m as calculated from the following:   Height as of this encounter: 4\' 10"  (1.473 m).   Weight as of this encounter: 74.9 kg.   Code status:   Code Status: Full Code   DVT Prophylaxis: Place and maintain sequential compression device Start: 03/13/22 2117 SCDs Start: 03/10/22 0232   Family  Communication: Son at bedside by previous MD on 04/05/2022   Disposition Plan: Status is: Inpatient Remains inpatient appropriate because: Severity of illness   Planned Discharge Destination:Skilled nursing facility   Diet: Diet Order             Diet clear liquid Room service appropriate? Yes; Fluid consistency: Thin  Diet effective now                    MEDICATIONS: Scheduled Meds:  amLODipine  10 mg Oral Daily   arformoterol  15 mcg Nebulization BID   budesonide (PULMICORT) nebulizer solution  0.5 mg Nebulization BID   Chlorhexidine Gluconate Cloth  6 each Topical Daily   insulin aspart  0-15 Units Subcutaneous Q4H   multivitamin  1 tablet Oral QHS   nortriptyline  50 mg Oral QHS   mouth rinse  15 mL Mouth Rinse 4 times per day   pantoprazole (PROTONIX) IV  40 mg Intravenous Q24H   QUEtiapine  300 mg Oral QHS   revefenacin  175 mcg Nebulization Daily   sodium bicarbonate  650 mg Oral BID   Vilazodone HCl  40 mg Oral Daily   Continuous Infusions:  sodium chloride 10 mL (04/01/22 1039)   dextrose 5 % and 0.45% NaCl Stopped (03/31/22 0730)   piperacillin-tazobactam (ZOSYN)  IV 2.25 g (04/07/22 0603)   PRN Meds:.sodium chloride, acetaminophen, albuterol, docusate, haloperidol lactate, labetalol, lip balm, LORazepam, mineral oil-hydrophilic petrolatum, ondansetron (ZOFRAN) IV, mouth rinse, oxyCODONE-acetaminophen, polyethylene glycol, promethazine, sodium chloride flush   I have personally reviewed following labs and imaging studies  LABORATORY DATA:  Recent Labs  Lab 04/02/22 0309 04/03/22 0426 04/05/22 1400 04/06/22 0330 04/07/22 0440  WBC 14.0* 13.3* 11.5* 10.7* 10.6*  HGB 11.5* 11.3* 12.5 12.0 11.7*  HCT 34.9* 35.0* 38.9 36.1 35.4*  PLT 379 413* 423* 388 346  MCV 89.5 90.7 91.5 91.2 91.0  MCH 29.5 29.3 29.4 30.3 30.1  MCHC 33.0 32.3 32.1 33.2 33.1  RDW  17.3* 17.2* 16.7* 16.5* 16.6*    Recent Labs  Lab 03/31/22 2150 04/01/22 0320 04/03/22 0426  04/04/22 0358 04/05/22 0446 04/06/22 0330 04/07/22 0440  NA 141   < > 140 137 137 137 137  K 4.4   < > 4.5 4.2 4.5 3.9 4.1  CL 109   < > 110 108 105 107 108  CO2 17*   < > 20* 20* 19* 22 20*  ANIONGAP 15   < > 10 9 13 8 9   GLUCOSE 105*   < > 78 92 93 112* 88  BUN 50*   < > 40* 35* 29* 30* 27*  CREATININE 4.20*   < > 4.09* 4.20* 3.92* 4.26* 4.33*  ALBUMIN  --    < > 1.7* 1.8* 1.8* 1.8* 1.8*  CRP  --   --   --   --   --  2.6* 2.5*  BNP  --   --   --   --   --  89.4 66.3  MG 1.4*  --   --   --   --  1.6* 2.5*  CALCIUM 8.5*   < > 8.7* 8.5* 8.6* 8.4* 8.4*   < > = values in this interval not displayed.    MICROBIOLOGY: No results found for this or any previous visit (from the past 240 hour(s)).   RADIOLOGY STUDIES/RESULTS: CT ABDOMEN PELVIS WO CONTRAST  Result Date: 04/06/2022 CLINICAL DATA:  Abdominal pain and nausea.  Blood in stool. EXAM: CT ABDOMEN AND PELVIS WITHOUT CONTRAST TECHNIQUE: Multidetector CT imaging of the abdomen and pelvis was performed following the standard protocol without IV contrast. RADIATION DOSE REDUCTION: This exam was performed according to the departmental dose-optimization program which includes automated exposure control, adjustment of the mA and/or kV according to patient size and/or use of iterative reconstruction technique. COMPARISON:  03/31/2022 FINDINGS: Lower chest: No acute findings. Hepatobiliary: No mass visualized on this unenhanced exam. Prior cholecystectomy. No evidence of biliary obstruction. Pancreas: No mass or inflammatory process visualized on this unenhanced exam. Spleen:  Within normal limits in size. Adrenals/Urinary tract: Several tiny 1-2 mm bilateral renal calculi again seen. No evidence of ureteral calculi or hydronephrosis. Unremarkable unopacified urinary bladder. Stomach/Bowel: Moderate diffuse colonic wall thickening and pericolonic soft tissue stranding are new since previous study, consistent with diffuse colitis. Normal appendix  visualized. Evidence of abscess or free fluid. Vascular/Lymphatic: No pathologically enlarged lymph nodes identified. No evidence of abdominal aortic aneurysm. Aortic atherosclerotic calcification incidentally noted. Reproductive:  No mass or other significant abnormality. Other:  None. Musculoskeletal:  No suspicious bone lesions identified. IMPRESSION: Moderate diffuse colitis likely infectious in etiology. No evidence of abscess. Bilateral nephrolithiasis. No evidence of ureteral calculi or hydronephrosis. Electronically Signed   By: Marlaine Hind M.D.   On: 04/06/2022 12:51     LOS: 28 days   Signature  -    Lala Lund M.D on 04/07/2022 at 10:12 AM   -  To page go to [email or web address]

## 2022-04-08 DIAGNOSIS — R6521 Severe sepsis with septic shock: Secondary | ICD-10-CM | POA: Diagnosis not present

## 2022-04-08 DIAGNOSIS — A419 Sepsis, unspecified organism: Secondary | ICD-10-CM | POA: Diagnosis not present

## 2022-04-08 LAB — CBC
HCT: 35.2 % — ABNORMAL LOW (ref 36.0–46.0)
Hemoglobin: 11.7 g/dL — ABNORMAL LOW (ref 12.0–15.0)
MCH: 30.3 pg (ref 26.0–34.0)
MCHC: 33.2 g/dL (ref 30.0–36.0)
MCV: 91.2 fL (ref 80.0–100.0)
Platelets: 305 10*3/uL (ref 150–400)
RBC: 3.86 MIL/uL — ABNORMAL LOW (ref 3.87–5.11)
RDW: 16.4 % — ABNORMAL HIGH (ref 11.5–15.5)
WBC: 8.6 10*3/uL (ref 4.0–10.5)
nRBC: 0 % (ref 0.0–0.2)

## 2022-04-08 LAB — RENAL FUNCTION PANEL
Albumin: 1.8 g/dL — ABNORMAL LOW (ref 3.5–5.0)
Anion gap: 12 (ref 5–15)
BUN: 28 mg/dL — ABNORMAL HIGH (ref 6–20)
CO2: 20 mmol/L — ABNORMAL LOW (ref 22–32)
Calcium: 8.6 mg/dL — ABNORMAL LOW (ref 8.9–10.3)
Chloride: 104 mmol/L (ref 98–111)
Creatinine, Ser: 4.52 mg/dL — ABNORMAL HIGH (ref 0.44–1.00)
GFR, Estimated: 11 mL/min — ABNORMAL LOW (ref >60)
Glucose, Bld: 83 mg/dL (ref 70–99)
Phosphorus: 6.9 mg/dL — ABNORMAL HIGH (ref 2.5–4.6)
Potassium: 4.3 mmol/L (ref 3.5–5.1)
Sodium: 136 mmol/L (ref 135–145)

## 2022-04-08 LAB — GLUCOSE, CAPILLARY: Glucose-Capillary: 84 mg/dL (ref 70–99)

## 2022-04-08 LAB — BRAIN NATRIURETIC PEPTIDE: B Natriuretic Peptide: 43.6 pg/mL (ref 0.0–100.0)

## 2022-04-08 LAB — MAGNESIUM: Magnesium: 2.4 mg/dL (ref 1.7–2.4)

## 2022-04-08 LAB — C-REACTIVE PROTEIN: CRP: 3.6 mg/dL — ABNORMAL HIGH (ref <1.0)

## 2022-04-08 MED ORDER — UMECLIDINIUM BROMIDE 62.5 MCG/ACT IN AEPB
1.0000 | INHALATION_SPRAY | Freq: Every day | RESPIRATORY_TRACT | Status: DC
  Administered 2022-04-08 – 2022-04-23 (×15): 1 via RESPIRATORY_TRACT
  Filled 2022-04-08 (×2): qty 7

## 2022-04-08 MED ORDER — KETOROLAC TROMETHAMINE 15 MG/ML IJ SOLN: 15.0000 mg | Freq: Once | INTRAMUSCULAR | Status: DC

## 2022-04-08 MED ORDER — OXYCODONE HCL 5 MG PO TABS
5.0000 mg | ORAL_TABLET | ORAL | Status: DC | PRN
  Administered 2022-04-08: 10 mg via ORAL
  Administered 2022-04-08: 5 mg via ORAL
  Administered 2022-04-09 – 2022-04-10 (×3): 10 mg via ORAL
  Administered 2022-04-14 – 2022-04-20 (×12): 5 mg via ORAL
  Administered 2022-04-21 – 2022-04-22 (×2): 10 mg via ORAL
  Administered 2022-04-22: 5 mg via ORAL
  Administered 2022-04-22: 10 mg via ORAL
  Administered 2022-04-23: 5 mg via ORAL
  Filled 2022-04-08 (×3): qty 1
  Filled 2022-04-08 (×2): qty 2
  Filled 2022-04-08: qty 1
  Filled 2022-04-08 (×2): qty 2
  Filled 2022-04-08 (×6): qty 1
  Filled 2022-04-08: qty 2
  Filled 2022-04-08 (×2): qty 1
  Filled 2022-04-08 (×2): qty 2
  Filled 2022-04-08 (×3): qty 1

## 2022-04-08 MED ORDER — METHOCARBAMOL 500 MG PO TABS
1000.0000 mg | ORAL_TABLET | Freq: Three times a day (TID) | ORAL | Status: DC
  Administered 2022-04-08 – 2022-04-23 (×45): 1000 mg via ORAL
  Filled 2022-04-08 (×45): qty 2

## 2022-04-08 MED ORDER — LIDOCAINE 5 % EX PTCH
1.0000 | MEDICATED_PATCH | CUTANEOUS | Status: DC
  Administered 2022-04-08 – 2022-04-18 (×9): 1 via TRANSDERMAL
  Filled 2022-04-08 (×14): qty 1

## 2022-04-08 MED ORDER — ACETAMINOPHEN 500 MG PO TABS
1000.0000 mg | ORAL_TABLET | Freq: Four times a day (QID) | ORAL | Status: DC
  Administered 2022-04-08 – 2022-04-18 (×33): 1000 mg via ORAL
  Filled 2022-04-08 (×35): qty 2

## 2022-04-08 MED ORDER — FLUTICASONE FUROATE-VILANTEROL 200-25 MCG/ACT IN AEPB
1.0000 | INHALATION_SPRAY | Freq: Every day | RESPIRATORY_TRACT | Status: DC
  Administered 2022-04-08 – 2022-04-23 (×15): 1 via RESPIRATORY_TRACT
  Filled 2022-04-08 (×2): qty 28

## 2022-04-08 NOTE — Progress Notes (Signed)
General Surgery Follow Up Note  Subjective:    Overnight Issues: last 3BMs with no blood, reports back pain  Objective:  Vital signs for last 24 hours: Temp:  [97.9 F (36.6 C)-98.8 F (37.1 C)] 97.9 F (36.6 C) (01/29 0500) Pulse Rate:  [95] 95 (01/28 1133) Resp:  [12-20] 20 (01/29 0500) BP: (139-148)/(85-89) 139/85 (01/29 0500) SpO2:  [100 %] 100 % (01/28 1133)  Hemodynamic parameters for last 24 hours:    Intake/Output from previous day: 01/28 0701 - 01/29 0700 In: 1064 [IV Piggyback:1064] Out: 202 [Urine:201; Stool:1]  Intake/Output this shift: Total I/O In: 100 [IV Piggyback:100] Out: -   Vent settings for last 24 hours:    Physical Exam:  Gen: comfortable, no distress Neuro: non-focal exam HEENT: PERRL Neck: supple CV: RRR Pulm: unlabored breathing Abd: soft, NT GU: clear yellow urine Extr: wwp, no edema   Results for orders placed or performed during the hospital encounter of 03/10/22 (from the past 24 hour(s))  Glucose, capillary     Status: Abnormal   Collection Time: 04/07/22  8:37 PM  Result Value Ref Range   Glucose-Capillary 102 (H) 70 - 99 mg/dL  CBC     Status: Abnormal   Collection Time: 04/08/22  4:14 AM  Result Value Ref Range   WBC 8.6 4.0 - 10.5 K/uL   RBC 3.86 (L) 3.87 - 5.11 MIL/uL   Hemoglobin 11.7 (L) 12.0 - 15.0 g/dL   HCT 35.2 (L) 36.0 - 46.0 %   MCV 91.2 80.0 - 100.0 fL   MCH 30.3 26.0 - 34.0 pg   MCHC 33.2 30.0 - 36.0 g/dL   RDW 16.4 (H) 11.5 - 15.5 %   Platelets 305 150 - 400 K/uL   nRBC 0.0 0.0 - 0.2 %  Renal function panel     Status: Abnormal   Collection Time: 04/08/22  4:14 AM  Result Value Ref Range   Sodium 136 135 - 145 mmol/L   Potassium 4.3 3.5 - 5.1 mmol/L   Chloride 104 98 - 111 mmol/L   CO2 20 (L) 22 - 32 mmol/L   Glucose, Bld 83 70 - 99 mg/dL   BUN 28 (H) 6 - 20 mg/dL   Creatinine, Ser 4.52 (H) 0.44 - 1.00 mg/dL   Calcium 8.6 (L) 8.9 - 10.3 mg/dL   Phosphorus 6.9 (H) 2.5 - 4.6 mg/dL   Albumin 1.8  (L) 3.5 - 5.0 g/dL   GFR, Estimated 11 (L) >60 mL/min   Anion gap 12 5 - 15  Magnesium     Status: None   Collection Time: 04/08/22  4:14 AM  Result Value Ref Range   Magnesium 2.4 1.7 - 2.4 mg/dL  C-reactive protein     Status: Abnormal   Collection Time: 04/08/22  4:14 AM  Result Value Ref Range   CRP 3.6 (H) <1.0 mg/dL  Brain natriuretic peptide     Status: None   Collection Time: 04/08/22  4:14 AM  Result Value Ref Range   B Natriuretic Peptide 43.6 0.0 - 100.0 pg/mL  Glucose, capillary     Status: None   Collection Time: 04/08/22  8:09 AM  Result Value Ref Range   Glucose-Capillary 84 70 - 99 mg/dL    Assessment & Plan: The plan of care was discussed with the bedside nurse for the day, who is in agreement with this plan and no additional concerns were raised.   Present on Admission:  Septic shock ([place])    LOS:  29 days   Additional comments:I reviewed the patient's new clinical lab test results.   and I reviewed the patients new imaging test results.    s/p Procedure(s): COLONOSCOPY WITH PROPOFOL (N/A) BIOPSY ESOPHAGOGASTRODUODENOSCOPY (EGD) WITH PROPOFOL (N/A) Diffuse colitis.  Continue clears.  Continue IV zosyn. Consider adding flagyl GI bleeding secondary to colitis with ulcerations - s/p colonoscopy, Biopsies negative for acute infection or inflammatory bowel disease   - s/p transfusion 6 units from 1/20-1/21 - CTA 1/21 without localized bleeding  - surgery would be last line for this patient without localized source of bleeding, TAC with risk of continued bleeding if ulcerations in large bowel continue to bleed. TAC would also result in ileostomy with high risk of dehydration from this.  - H/h has remained stable and no longer having frankly bloody bowel movements. Bleeding seems to have resolved without surgery but she has ongoing abdominal pain, nausea, and poor oral intake. Continue medical management. CT abdomen pelvis 1/28 with evidence of probable  infectious colitis.   FEN: IVF - diet advanced by primary team to soft, encourage PO as tolerated. Consider Ensure/Boost supplementation TID   VTE: SCDs, no chemical prophylaxis in setting of GI bleed ID: [email or web address]   Jesusita Oka, MD Trauma & General Surgery Please use [email or web address] to contact on call provider  04/08/2022  *Care during the described time interval was provided by me. I have reviewed this patient's available data, including medical history, events of note, physical examination and test results as part of my evaluation.

## 2022-04-08 NOTE — Progress Notes (Signed)
PROGRESS NOTE        PATIENT DETAILS Name: Darlene Barber Age: 57 y.o. Sex: female Date of Birth: 07-Apr-1965 Admit Date: 03/10/2022 Admitting Physician Rigoberto Noel, MD [id number], Chrystie Nose, MD  Brief Summary: Patient is a 57 y.o.  female who was initially admitted to the ICU with septic shock-respiratory failure requiring pressors/intubation on initial presentation.  She was subsequently was found to have Streptococcus bacteremia.  Hospital course was complicated by AKI requiring CRRT and acute metabolic encephalopathy/ICU delirium.  She was stabilized and transferred to [facility] on 1/17.  Subsequent hospital course complicated by lower GI bleeding with acute blood loss anemia-requiring transfer back to the ICU.  Ultimately she was found to have large ulcers in her entire colon.  She required multiple units of PRBC transfusion.  She was transferred back to the [facility] service on 1/24.  Significant events: 12/31>> admit to ICU-septic shock-intubated-required pressors stool studies/COVID/RSV/flu negative.  Blood cultures positive for strep 01/3 CRRT 01/5>> TEE done with no evidence of vegetations 01/11>>extubated, transferred from [facility] for iHD 01/17>> transfer to [facility] services 01/18>> developed lower GI bleeding-GI consulted-PRBC transfused 01/18>> transfer back to the ICU 01/20>> colonoscopy with multiple ulcers in colon. 01/24>> transferred to [facility]  Significant studies: 12/30>> CT chest/abdomen/pelvis: Fluid throughout stomach/small bowel/colon-nonspecific 01/05>> CT chest/abdomen/pelvis: Persistent areas of wall thickening along the colon, patchy bilateral ill-defined lung opacities 01/19>> CT abdomen/pelvis: Diarrheal illness with fluid reaching the rectum, colonic wall thickening is improved.  Improving bilateral PNA.  Bilateral nephrolithiasis without obstruction. 01/19>> CT angio GI bleed: Limited evaluation because of oral contrast within the distal small  bowel/throughout colon-active GI bleeding could not be assessed. 01/21>> CT angio GI bleed: No active GI bleeding, multiple infectious/inflammatory colitis involving splenic flexure and ascending colon.  Significant microbiology data: 12/30>> blood culture: Streptococcus Infantarius 12/30>> stool C. difficile negative 12/30>> GI pathogen panel: Negative 01/02>> tracheal aspirate: No organisms 01/03>> blood culture: No growth 01/03>> blood culture: No growth 01/17>> blood culture: No growth  Procedures: 12/31-1/11>> ETT 1/05>> TEE: EF 60-65%-no vegetations 1/20>> colonoscopy: Blood in entire colon, multiple ulcers throughout the colon.  Consults: PCCM GI General surgery Nephrology ID  Subjective:  Bed, no headache or chest pain, mild nausea, abdominal pain is serially improving, still has some loose stools, no focal weakness.  Objective: Vitals: Blood pressure 139/85, pulse 95, temperature 97.9 F (36.6 C), temperature source Oral, resp. rate 20, height 4\' 10"  (1.473 m), weight 74.9 kg, SpO2 100 %.   Exam:  Awake Alert, No new F.N deficits, Normal affect [place].AT,PERRAL Supple Neck, No JVD,   Symmetrical Chest wall movement, Good air movement bilaterally, CTAB RRR,No Gallops, Rubs or new Murmurs,  +ve B.Sounds, Abd Soft, mild to moderate abdominal tenderness but improving No Cyanosis, Clubbing or edema     Assessment/Plan:  Septic shock likely due to GI source, required intubation due to septic shock developed ventilator associated pneumonia, Streptococcus Infantarius bacteremia   Sepsis physiology has resolved Had completed a course of antibiotics on 1/17-now back on Zosyn with Flagyl for severe colitis, continue for now TEE negative for vegetations  Lower GI bleeding due to severe colitis with multiple colon ulcers Acute blood loss anemia Follow CBC trend (8 units PRBC from 1/18-1/21) CT angio without any localized bleeding Empirically on Zosyn since 1/20 Recent  stool studies negative Underwent colonoscopy with biopsies obtained during colonoscopy  showing nonspecific inflammation, GI has signed off but thought that infectious versus ischemic colitis is in differential. Gen.surgery following, CT abdomen from 04/06/2022 noted with continued infectious/inflammatory colitis, case discussed with general surgery, no surgical plans but continue conservative management with bowel rest till abdominal pain much improved, continue antibiotics. Some diarrhea overnight but patient also has history of IBS and diarrhea is not unusual for her.  Acute toxic/metabolic encephalopathy ICU delirium Multifactorial etiology-ICU delirium/encephalopathy due to sepsis/AKI Much improved-awake/alert this morning. Maintain delirium precautions Continue Seroquel 300 nightly Continue vilazodone  AKI Multifactorial etiology-felt to be ischemic ATN from septic shock-possibly some component of infectious (strep) glomerulonephritis-and now possibly contrast related (CT angio)  Avoid nephrotoxic agents Nephrology following Defer decision to resume HD/CRRT per nephrology service  Shock liver Improved Trend LFTs periodically  HTN BP stable Continue amlodipine  GERD PPI  Bipolar disorder/depression Complicated by encephalopathy Continue Seroquel/vilazodone  COPD Not in exacerbation-continue bronchodilators  Hypomagnesemia.  Replaced.    OSA CPAP when able  Nutrition Status: Nutrition Problem: Inadequate oral intake Etiology: inability to eat Signs/Symptoms: NPO status (on vent) Interventions: Refer to RD note for recommendations  Obesity: Estimated body mass index is 34.51 kg/m as calculated from the following:   Height as of this encounter: 4\' 10"  (1.473 m).   Weight as of this encounter: 74.9 kg.   Code status:   Code Status: Full Code   DVT Prophylaxis: Place and maintain sequential compression device Start: 03/13/22 2117 SCDs Start: 03/10/22 0232    Family Communication: Son at bedside by previous MD on 04/05/2022   Disposition Plan: Status is: Inpatient Remains inpatient appropriate because: Severity of illness   Planned Discharge Destination:Skilled nursing facility   Diet: Diet Order             DIET SOFT Fluid consistency: Thin  Diet effective now                    MEDICATIONS: Scheduled Meds:  amLODipine  10 mg Oral Daily   arformoterol  15 mcg Nebulization BID   budesonide (PULMICORT) nebulizer solution  0.5 mg Nebulization BID   Chlorhexidine Gluconate Cloth  6 each Topical Daily   insulin aspart  0-15 Units Subcutaneous Q4H   multivitamin  1 tablet Oral QHS   nortriptyline  50 mg Oral QHS   mouth rinse  15 mL Mouth Rinse 4 times per day   pantoprazole (PROTONIX) IV  40 mg Intravenous Q24H   QUEtiapine  300 mg Oral QHS   revefenacin  175 mcg Nebulization Daily   sodium bicarbonate  650 mg Oral BID   Vilazodone HCl  40 mg Oral Daily   Continuous Infusions:  sodium chloride 10 mL (04/01/22 1039)   dextrose 5 % and 0.45% NaCl Stopped (03/31/22 0730)   metronidazole Stopped (04/08/22 0216)   piperacillin-tazobactam (ZOSYN)  IV 2.25 g (04/08/22 0459)   PRN Meds:.sodium chloride, acetaminophen, albuterol, docusate, haloperidol lactate, labetalol, lip balm, LORazepam, mineral oil-hydrophilic petrolatum, ondansetron (ZOFRAN) IV, mouth rinse, oxyCODONE-acetaminophen, polyethylene glycol, promethazine, sodium chloride flush   I have personally reviewed following labs and imaging studies  LABORATORY DATA:  Recent Labs  Lab 04/03/22 0426 04/05/22 1400 04/06/22 0330 04/07/22 0440 04/08/22 0414  WBC 13.3* 11.5* 10.7* 10.6* 8.6  HGB 11.3* 12.5 12.0 11.7* 11.7*  HCT 35.0* 38.9 36.1 35.4* 35.2*  PLT 413* 423* 388 346 305  MCV 90.7 91.5 91.2 91.0 91.2  MCH 29.3 29.4 30.3 30.1 30.3  MCHC 32.3 32.1 33.2 33.1 33.2  RDW 17.2* 16.7* 16.5* 16.6* 16.4*    Recent Labs  Lab 04/04/22 0358 04/05/22 0446  04/06/22 0330 04/07/22 0440 04/08/22 0414  NA 137 137 137 137 136  K 4.2 4.5 3.9 4.1 4.3  CL 108 105 107 108 104  CO2 20* 19* 22 20* 20*  ANIONGAP 9 13 8 9 12   GLUCOSE 92 93 112* 88 83  BUN 35* 29* 30* 27* 28*  CREATININE 4.20* 3.92* 4.26* 4.33* 4.52*  ALBUMIN 1.8* 1.8* 1.8* 1.8* 1.8*  CRP  --   --  2.6* 2.5* 3.6*  BNP  --   --  89.4 66.3 43.6  MG  --   --  1.6* 2.5* 2.4  CALCIUM 8.5* 8.6* 8.4* 8.4* 8.6*    MICROBIOLOGY: No results found for this or any previous visit (from the past 240 hour(s)).   RADIOLOGY STUDIES/RESULTS: CT ABDOMEN PELVIS WO CONTRAST  Result Date: 04/06/2022 CLINICAL DATA:  Abdominal pain and nausea.  Blood in stool. EXAM: CT ABDOMEN AND PELVIS WITHOUT CONTRAST TECHNIQUE: Multidetector CT imaging of the abdomen and pelvis was performed following the standard protocol without IV contrast. RADIATION DOSE REDUCTION: This exam was performed according to the departmental dose-optimization program which includes automated exposure control, adjustment of the mA and/or kV according to patient size and/or use of iterative reconstruction technique. COMPARISON:  03/31/2022 FINDINGS: Lower chest: No acute findings. Hepatobiliary: No mass visualized on this unenhanced exam. Prior cholecystectomy. No evidence of biliary obstruction. Pancreas: No mass or inflammatory process visualized on this unenhanced exam. Spleen:  Within normal limits in size. Adrenals/Urinary tract: Several tiny 1-2 mm bilateral renal calculi again seen. No evidence of ureteral calculi or hydronephrosis. Unremarkable unopacified urinary bladder. Stomach/Bowel: Moderate diffuse colonic wall thickening and pericolonic soft tissue stranding are new since previous study, consistent with diffuse colitis. Normal appendix visualized. Evidence of abscess or free fluid. Vascular/Lymphatic: No pathologically enlarged lymph nodes identified. No evidence of abdominal aortic aneurysm. Aortic atherosclerotic calcification  incidentally noted. Reproductive:  No mass or other significant abnormality. Other:  None. Musculoskeletal:  No suspicious bone lesions identified. IMPRESSION: Moderate diffuse colitis likely infectious in etiology. No evidence of abscess. Bilateral nephrolithiasis. No evidence of ureteral calculi or hydronephrosis. Electronically Signed   By: Marlaine Hind M.D.   On: 04/06/2022 12:51     LOS: 29 days   Signature  -    Lala Lund M.D on 04/08/2022 at 9:24 AM   -  To page go to [email or web address]

## 2022-04-09 DIAGNOSIS — R6521 Severe sepsis with septic shock: Secondary | ICD-10-CM | POA: Diagnosis not present

## 2022-04-09 DIAGNOSIS — A419 Sepsis, unspecified organism: Secondary | ICD-10-CM | POA: Diagnosis not present

## 2022-04-09 LAB — RENAL FUNCTION PANEL
Albumin: 1.7 g/dL — ABNORMAL LOW (ref 3.5–5.0)
Anion gap: 10 (ref 5–15)
BUN: 29 mg/dL — ABNORMAL HIGH (ref 6–20)
CO2: 20 mmol/L — ABNORMAL LOW (ref 22–32)
Calcium: 8.3 mg/dL — ABNORMAL LOW (ref 8.9–10.3)
Chloride: 106 mmol/L (ref 98–111)
Creatinine, Ser: 4.61 mg/dL — ABNORMAL HIGH (ref 0.44–1.00)
GFR, Estimated: 11 mL/min — ABNORMAL LOW (ref >60)
Glucose, Bld: 94 mg/dL (ref 70–99)
Phosphorus: 7.3 mg/dL — ABNORMAL HIGH (ref 2.5–4.6)
Potassium: 4 mmol/L (ref 3.5–5.1)
Sodium: 136 mmol/L (ref 135–145)

## 2022-04-09 LAB — CBC
HCT: 36.1 % (ref 36.0–46.0)
Hemoglobin: 11.4 g/dL — ABNORMAL LOW (ref 12.0–15.0)
MCH: 29.2 pg (ref 26.0–34.0)
MCHC: 31.6 g/dL (ref 30.0–36.0)
MCV: 92.6 fL (ref 80.0–100.0)
Platelets: 289 10*3/uL (ref 150–400)
RBC: 3.9 MIL/uL (ref 3.87–5.11)
RDW: 16.6 % — ABNORMAL HIGH (ref 11.5–15.5)
WBC: 8.3 10*3/uL (ref 4.0–10.5)
nRBC: 0 % (ref 0.0–0.2)

## 2022-04-09 LAB — C-REACTIVE PROTEIN: CRP: 4 mg/dL — ABNORMAL HIGH (ref <1.0)

## 2022-04-09 LAB — MAGNESIUM: Magnesium: 2 mg/dL (ref 1.7–2.4)

## 2022-04-09 LAB — BRAIN NATRIURETIC PEPTIDE: B Natriuretic Peptide: 40.1 pg/mL (ref 0.0–100.0)

## 2022-04-09 NOTE — Progress Notes (Signed)
PROGRESS NOTE        PATIENT DETAILS Name: Darlene Barber Age: 57 y.o. Sex: female Date of Birth: 04-Aug-1965 Admit Date: 03/10/2022 Admitting Physician Rigoberto Noel, MD [id number], Chrystie Nose, MD  Brief Summary: Patient is a 57 y.o.  female who was initially admitted to the ICU with septic shock-respiratory failure requiring pressors/intubation on initial presentation.  She was subsequently was found to have Streptococcus bacteremia.  Hospital course was complicated by AKI requiring CRRT and acute metabolic encephalopathy/ICU delirium.  She was stabilized and transferred to [facility] on 1/17.  Subsequent hospital course complicated by lower GI bleeding with acute blood loss anemia-requiring transfer back to the ICU.  Ultimately she was found to have large ulcers in her entire colon.  She required multiple units of PRBC transfusion.  She was transferred back to the [facility] service on 1/24.  Significant events: 12/31>> admit to ICU-septic shock-intubated-required pressors stool studies/COVID/RSV/flu negative.  Blood cultures positive for strep 01/3 CRRT 01/5>> TEE done with no evidence of vegetations 01/11>>extubated, transferred from [facility] for iHD 01/17>> transfer to [facility] services 01/18>> developed lower GI bleeding-GI consulted-PRBC transfused 01/18>> transfer back to the ICU 01/20>> colonoscopy with multiple ulcers in colon. 01/24>> transferred to [facility]  Significant studies: 12/30>> CT chest/abdomen/pelvis: Fluid throughout stomach/small bowel/colon-nonspecific 01/05>> CT chest/abdomen/pelvis: Persistent areas of wall thickening along the colon, patchy bilateral ill-defined lung opacities 01/19>> CT abdomen/pelvis: Diarrheal illness with fluid reaching the rectum, colonic wall thickening is improved.  Improving bilateral PNA.  Bilateral nephrolithiasis without obstruction. 01/19>> CT angio GI bleed: Limited evaluation because of oral contrast within the distal small  bowel/throughout colon-active GI bleeding could not be assessed. 01/21>> CT angio GI bleed: No active GI bleeding, multiple infectious/inflammatory colitis involving splenic flexure and ascending colon.  Significant microbiology data: 12/30>> blood culture: Streptococcus Infantarius 12/30>> stool C. difficile negative 12/30>> GI pathogen panel: Negative 01/02>> tracheal aspirate: No organisms 01/03>> blood culture: No growth 01/03>> blood culture: No growth 01/17>> blood culture: No growth  Procedures: 12/31-1/11>> ETT 1/05>> TEE: EF 60-65%-no vegetations 1/20>> colonoscopy: Blood in entire colon, multiple ulcers throughout the colon.  Consults: PCCM GI General surgery Nephrology ID  Subjective:  Bed, no headache or chest pain, mild nausea, abdominal pain is serially improving, still has some loose stools but no blood, no focal weakness.  Objective: Vitals: Blood pressure 138/83, pulse 100, temperature 97.6 F (36.4 C), temperature source Oral, resp. rate 15, height 4\' 10"  (1.473 m), weight 74.9 kg, SpO2 96 %.   Exam:  Awake Alert, No new F.N deficits, Normal affect [place].AT,PERRAL Supple Neck, No JVD,   Symmetrical Chest wall movement, Good air movement bilaterally, CTAB RRR,No Gallops, Rubs or new Murmurs,  +ve B.Sounds, Abd Soft, mild to moderate abdominal tenderness but improving No Cyanosis, Clubbing or edema     Assessment/Plan:  Septic shock likely due to GI source, required intubation due to septic shock developed ventilator associated pneumonia, Streptococcus Infantarius bacteremia   Sepsis physiology has resolved Had completed a course of antibiotics on 1/17-now back on Zosyn with Flagyl for severe colitis, continue for now TEE negative for vegetations  Lower GI bleeding due to severe colitis with multiple colon ulcers ischemic versus infectious Acute blood loss anemia Follow CBC trend (8 units PRBC from 1/18-1/21) CT angio without any localized  bleeding Empirically on Zosyn since 1/20 Recent stool studies negative Underwent  colonoscopy with biopsies obtained during colonoscopy showing nonspecific inflammation, GI has signed off but thought that infectious versus ischemic colitis is in differential. Gen.surgery following, CT abdomen from 04/06/2022 noted with continued infectious/inflammatory colitis, case discussed with general surgery, no surgical plans but continue conservative management with bowel rest till abdominal pain much improved, continue antibiotics. Some diarrhea overnight but patient also has history of IBS and diarrhea is not unusual for her.  Acute toxic/metabolic encephalopathy ICU delirium Multifactorial etiology-ICU delirium/encephalopathy due to sepsis/AKI Much improved-awake/alert this morning. Maintain delirium precautions Continue Seroquel 300 nightly Continue vilazodone  AKI Multifactorial etiology-felt to be ischemic ATN from septic shock-possibly some component of infectious (strep) glomerulonephritis-and now possibly contrast related (CT angio)  Avoid nephrotoxic agents Nephrology following Defer decision to resume HD/CRRT per nephrology service  Shock liver Improved Trend LFTs periodically  HTN BP stable Continue amlodipine  GERD PPI  Bipolar disorder/depression Complicated by encephalopathy Continue Seroquel/vilazodone  COPD Not in exacerbation-continue bronchodilators  Hypomagnesemia.  Replaced.    OSA CPAP when able  Nutrition Status: Nutrition Problem: Inadequate oral intake Etiology: inability to eat Signs/Symptoms: NPO status (on vent) Interventions: MVI  Obesity: Estimated body mass index is 34.51 kg/m as calculated from the following:   Height as of this encounter: 4\' 10"  (1.473 m).   Weight as of this encounter: 74.9 kg.   Code status:   Code Status: Full Code   DVT Prophylaxis: Place and maintain sequential compression device Start: 03/13/22 2117 SCDs Start:  03/10/22 0232   Family Communication: Son at bedside by previous MD on 04/05/2022   Disposition Plan: Status is: Inpatient Remains inpatient appropriate because: Severity of illness   Planned Discharge Destination:Skilled nursing facility   Diet: Diet Order             DIET SOFT Fluid consistency: Thin  Diet effective now                    MEDICATIONS: Scheduled Meds:  acetaminophen  1,000 mg Oral Q6H   amLODipine  10 mg Oral Daily   Chlorhexidine Gluconate Cloth  6 each Topical Daily   fluticasone furoate-vilanterol  1 puff Inhalation Daily   And   umeclidinium bromide  1 puff Inhalation Daily   lidocaine  1 patch Transdermal Q24H   methocarbamol  1,000 mg Oral Q8H   multivitamin  1 tablet Oral QHS   nortriptyline  50 mg Oral QHS   mouth rinse  15 mL Mouth Rinse 4 times per day   pantoprazole (PROTONIX) IV  40 mg Intravenous Q24H   QUEtiapine  300 mg Oral QHS   sodium bicarbonate  650 mg Oral BID   Vilazodone HCl  40 mg Oral Daily   Continuous Infusions:  sodium chloride 10 mL (04/01/22 1039)   dextrose 5 % and 0.45% NaCl Stopped (03/31/22 0730)   metronidazole 500 mg (04/09/22 0100)   piperacillin-tazobactam (ZOSYN)  IV 2.25 g (04/09/22 0606)   PRN Meds:.sodium chloride, albuterol, docusate, haloperidol lactate, labetalol, lip balm, LORazepam, mineral oil-hydrophilic petrolatum, ondansetron (ZOFRAN) IV, mouth rinse, oxyCODONE, polyethylene glycol, promethazine, sodium chloride flush   I have personally reviewed following labs and imaging studies  LABORATORY DATA:  Recent Labs  Lab 04/05/22 1400 04/06/22 0330 04/07/22 0440 04/08/22 0414 04/09/22 0449  WBC 11.5* 10.7* 10.6* 8.6 8.3  HGB 12.5 12.0 11.7* 11.7* 11.4*  HCT 38.9 36.1 35.4* 35.2* 36.1  PLT 423* 388 346 305 289  MCV 91.5 91.2 91.0 91.2 92.6  MCH 29.4 30.3  30.1 30.3 29.2  MCHC 32.1 33.2 33.1 33.2 31.6  RDW 16.7* 16.5* 16.6* 16.4* 16.6*    Recent Labs  Lab 04/05/22 0446 04/06/22 0330  04/07/22 0440 04/08/22 0414 04/09/22 0449  NA 137 137 137 136 136  K 4.5 3.9 4.1 4.3 4.0  CL 105 107 108 104 106  CO2 19* 22 20* 20* 20*  ANIONGAP 13 8 9 12 10   GLUCOSE 93 112* 88 83 94  BUN 29* 30* 27* 28* 29*  CREATININE 3.92* 4.26* 4.33* 4.52* 4.61*  ALBUMIN 1.8* 1.8* 1.8* 1.8* 1.7*  CRP  --  2.6* 2.5* 3.6* 4.0*  BNP  --  89.4 66.3 43.6 40.1  MG  --  1.6* 2.5* 2.4 2.0  CALCIUM 8.6* 8.4* 8.4* 8.6* 8.3*    MICROBIOLOGY: No results found for this or any previous visit (from the past 240 hour(s)).   RADIOLOGY STUDIES/RESULTS: No results found.   LOS: 30 days   Signature  -    Lala Lund M.D on 04/09/2022 at 10:28 AM   -  To page go to [email or web address]

## 2022-04-09 NOTE — Progress Notes (Signed)
Physical Therapy Treatment Patient Details Name: Darlene Barber MRN: [id number] DOB: [birth year] Today's Date: 04/09/2022   History of Present Illness 57 yo female admitted 12/30 to [facility] with abdominal pain and rash with sepsis and AKI. Respiratory failure intubated 1/2-1/11. CRRT (1/3-1/8, 1/10-1/11). pt also with metabolic encephalopathy acutely. PMhx: chronic pain, GERD, bipolar, tobacco and THC abuse, insomnia, HTN, tremor    PT Comments    Patient progressing and now pivoting to [facility] on her own, but does call for assist when needed.  She managed her telemetry wires as well.  She reports today had first solid food in a long time and was understandably concerned about her bowels acting so declined ambulation, however, was present to observe two transfers to/from [facility] then third back to bed when she needed minguard A due to fatigue and weakness and had appropriately called out for help.  She is progressing, but continues to be appropriate for STSNF at d/c as not ambulatory nor able to negotiate steps into apartment.  PT will continue to follow.    Recommendations for follow up therapy are one component of a multi-disciplinary discharge planning process, led by the attending physician.  Recommendations may be updated based on patient status, additional functional criteria and insurance authorization.  Follow Up Recommendations  Skilled nursing-short term rehab (<3 hours/day) Can patient physically be transported by private vehicle: No   Assistance Recommended at Discharge Frequent or constant Supervision/Assistance  Patient can return home with the following A little help with walking and/or transfers;A little help with bathing/dressing/bathroom;Assistance with cooking/housework;Help with stairs or ramp for entrance   Equipment Recommendations  Rolling walker (2 wheels)    Recommendations for Other Services       Precautions / Restrictions Precautions Precautions: Fall     Mobility   Bed Mobility Overal bed mobility: Modified Independent             General bed mobility comments: up to EOB and back to supine on her own with PT in the room    Transfers Overall transfer level: Needs assistance   Transfers: Sit to/from Stand, Bed to chair/wheelchair/BSC Sit to Stand: Supervision Stand pivot transfers: Supervision, Min guard         General transfer comment: observed x 3 transfers to/from [facility] as pt managing cords, etc on her own, but minguard provided last time as pt reported feeling weak.    Ambulation/Gait                   Stairs             Wheelchair Mobility    Modified Rankin (Stroke Patients Only)       Balance                                            Cognition Arousal/Alertness: Awake/alert Behavior During Therapy: Anxious Overall Cognitive Status: No family/caregiver present to determine baseline cognitive functioning                                 General Comments: some safety concerns as pt toileting on her own, but mobilizing well and did call for assistance when not feeling safe        Exercises      General Comments        Pertinent  Vitals/Pain Pain Assessment Faces Pain Scale: Hurts a little bit Pain Location: stomach Pain Descriptors / Indicators: Discomfort, Guarding Pain Intervention(s): Monitored during session, Limited activity within patient's tolerance    Home Living                          Prior Function            PT Goals (current goals can now be found in the care plan section) Progress towards PT goals: Progressing toward goals    Frequency    Min 2X/week      PT Plan Current plan remains appropriate    Co-evaluation              AM-PAC PT "6 Clicks" Mobility   Outcome Measure  Help needed turning from your back to your side while in a flat bed without using bedrails?: A Little Help needed moving from lying on your  back to sitting on the side of a flat bed without using bedrails?: A Little Help needed moving to and from a bed to a chair (including a wheelchair)?: A Little Help needed standing up from a chair using your arms (e.g., wheelchair or bedside chair)?: Total Help needed to walk in hospital room?: Total Help needed climbing 3-5 steps with a railing? : Total 6 Click Score: 12    End of Session   Activity Tolerance: Patient limited by fatigue;Other (comment) (limited due to concern for frequent stools, stomach issues) Patient left: in bed;with call bell/phone within reach   PT Visit Diagnosis: Muscle weakness (generalized) (M62.81);Difficulty in walking, not elsewhere classified (R26.2)     Time: 1130-1150 PT Time Calculation (min) (ACUTE ONLY): 20 min  Charges:  $Therapeutic Activity: 8-22 mins                     Magda Kiel, PT Acute Rehabilitation Services Office:[phone] 04/09/2022    Reginia Naas 04/09/2022, 1:17 PM

## 2022-04-09 NOTE — TOC Progression Note (Signed)
Transition of Care [facility]) - Progression Note    Patient Details  Name: Darlene Barber MRN: [id number] Date of Birth: 19-Sep-1965  Transition of Care [facility]) CM/SW [place], LCSW Phone Number: 04/09/2022, 11:58 AM  Clinical Narrative:    CSW requested [facility] authorization process for patient. Therapy on schedule for today.   Expected Discharge Plan: Skilled Nursing Facility Barriers to Discharge: Ship broker, Continued Medical Work up, SNF Pending bed offer  Expected Discharge Plan and Services In-house Referral: Clinical Social Work     Living arrangements for the past 2 months: Single Family Home                                       Social Determinants of Health (SDOH) Interventions SDOH Screenings   Depression (PHQ2-9): High Risk (09/03/2021)  Tobacco Use: High Risk (04/01/2022)    Readmission Risk Interventions    03/11/2022   12:18 PM  Readmission Risk Prevention Plan  Transportation Screening Complete  Medication Review ([place]) Complete  PCP or Specialist appointment within 3-5 days of discharge Complete  HRI or [place] Complete  SW Recovery Care/Counseling Consult Complete  [place] Not Applicable

## 2022-04-09 NOTE — Progress Notes (Signed)
OT Cancellation Note  Patient Details Name: Darlene Barber MRN: [id number] DOB: Jan 01, 1966   Cancelled Treatment:    Reason Eval/Treat Not Completed: Fatigue/lethargy limiting ability to participate pt reports having just woken up from a nap and declining OT session. Pt hesitant to moving "too much" d/t lose bowels, offered bed level BUE therex to increase strength/endurance with pt continuing to politely decline. Will continue efforts as time allows.   Harley Alto., COTA/L Acute Rehabilitation Services [phone]   Precious Haws 04/09/2022, 2:53 PM

## 2022-04-09 NOTE — TOC Progression Note (Signed)
Transition of Care [facility]) - Progression Note    Patient Details  Name: Darlene Barber MRN: [id number] Date of Birth: 1966-01-12  Transition of Care [facility]) CM/SW [place], LCSW Phone Number: 04/09/2022, 8:58 AM  Clinical Narrative:    CSW following for medical stability.    Expected Discharge Plan: Skilled Nursing Facility Barriers to Discharge: Ship broker, Continued Medical Work up, SNF Pending bed offer  Expected Discharge Plan and Services In-house Referral: Clinical Social Work     Living arrangements for the past 2 months: Single Family Home                                       Social Determinants of Health (SDOH) Interventions SDOH Screenings   Depression (PHQ2-9): High Risk (09/03/2021)  Tobacco Use: High Risk (04/01/2022)    Readmission Risk Interventions    03/11/2022   12:18 PM  Readmission Risk Prevention Plan  Transportation Screening Complete  Medication Review ([place]) Complete  PCP or Specialist appointment within 3-5 days of discharge Complete  HRI or [place] Complete  SW Recovery Care/Counseling Consult Complete  [place] Not Applicable

## 2022-04-09 NOTE — Progress Notes (Signed)
Nutrition Follow-up  DOCUMENTATION CODES:   Obesity unspecified  INTERVENTION:   Continue Renal Multivitamin w/ minerals daily Encourage good PO intake Meal ordering with assist  NUTRITION DIAGNOSIS:   Inadequate oral intake related to inability to eat as evidenced by NPO status (on vent). - Progressing, diet advanced  GOAL:   Patient will meet greater than or equal to 90% of their needs - Ongoing  MONITOR:   PO intake, Labs, Weight trends, I & O's  REASON FOR ASSESSMENT:   Consult Enteral/tube feeding initiation and management (trickle TF with recs)  ASSESSMENT:   57 yo female w/ pertinent PMH HTN, T2DM, OSA, urge incontinence, bipolar depression presents to ED on 12/30 w/ rash and sob. Found to have sepsis and acute hypoxemic respiratory failure.  12/30 - admit 01/01 - intubated, trickle TF started 01/03 - CRRT start 01/04 - TF advanced to goal rate 01/08 - CRRT stop 01/10 - CRRT restart 01/11 - extubated, CRRT stop, transferred to [facility] for iHD 01/12 - Cortrak placed (tip gastric) 01/16 - diet advanced to full liquids 01/17 - diet advanced to dysphagia 3 01/18 - TF changed to nocturnal (never started) 01/19 - clear liquids 01/20 - NPO, s/p colonoscopy showing large amount of blood and multiple ulcerations on the R side of the colon (no source identified), s/p EGD with no source of bleeding, Cortrak removed 01/21 - recurrent GI bleed 01/22 - diet advanced to clear liquids 01/23 - transferred to floor 01/29 - diet advanced to Soft  Met with pt, pt laying in bed. Pt reports that she took it slow, took a few bites of roll and rice. States that she has been taking her medicine in the morning to assist with not feeling nauseous all day long. Shares that she has been working on mobilizing more and getting up to the bedside commode.   Pt declined any nutritional supplements at this time.   Medications reviewed and include: Rena-vit, Protonix, IV antibiotics Labs  reviewed: Sodium 136, Potassium 4.0, Phosphorus 7.3, Magnesium 2.0  Diet Order:   Diet Order             DIET SOFT Fluid consistency: Thin  Diet effective now                   EDUCATION NEEDS:   No education needs have been identified at this time  Skin:  Skin Assessment: Skin Integrity Issues: Skin Integrity Issues:: Other (Comment) Other: skin tear L knee, MASD groin/abd/inner thighs, MARSI bilateral buttocks  Last BM:  1/29 - Type 6  Height:  Ht Readings from Last 1 Encounters:  03/12/22 4\' 10"  (1.473 m)   Weight:  Wt Readings from Last 1 Encounters:  04/02/22 74.9 kg   Ideal Body Weight:  43.2 kg  BMI:  Body mass index is 34.51 kg/m.  Estimated Nutritional Needs:  Kcal:  1700-1900 Protein:  85-100 grams Fluid:  1L+UOP   Hermina Barters RD, LDN Clinical Dietitian See [facility] for contact information.

## 2022-04-10 DIAGNOSIS — R6521 Severe sepsis with septic shock: Secondary | ICD-10-CM | POA: Diagnosis not present

## 2022-04-10 DIAGNOSIS — A419 Sepsis, unspecified organism: Secondary | ICD-10-CM | POA: Diagnosis not present

## 2022-04-10 LAB — RENAL FUNCTION PANEL
Albumin: 1.6 g/dL — ABNORMAL LOW (ref 3.5–5.0)
Anion gap: 11 (ref 5–15)
BUN: 29 mg/dL — ABNORMAL HIGH (ref 6–20)
CO2: 18 mmol/L — ABNORMAL LOW (ref 22–32)
Calcium: 8.4 mg/dL — ABNORMAL LOW (ref 8.9–10.3)
Chloride: 104 mmol/L (ref 98–111)
Creatinine, Ser: 4.85 mg/dL — ABNORMAL HIGH (ref 0.44–1.00)
GFR, Estimated: 10 mL/min — ABNORMAL LOW (ref >60)
Glucose, Bld: 105 mg/dL — ABNORMAL HIGH (ref 70–99)
Phosphorus: 7 mg/dL — ABNORMAL HIGH (ref 2.5–4.6)
Potassium: 4 mmol/L (ref 3.5–5.1)
Sodium: 133 mmol/L — ABNORMAL LOW (ref 135–145)

## 2022-04-10 LAB — CBC
HCT: 35.8 % — ABNORMAL LOW (ref 36.0–46.0)
Hemoglobin: 11.8 g/dL — ABNORMAL LOW (ref 12.0–15.0)
MCH: 30.2 pg (ref 26.0–34.0)
MCHC: 33 g/dL (ref 30.0–36.0)
MCV: 91.6 fL (ref 80.0–100.0)
Platelets: 261 10*3/uL (ref 150–400)
RBC: 3.91 MIL/uL (ref 3.87–5.11)
RDW: 16.4 % — ABNORMAL HIGH (ref 11.5–15.5)
WBC: 9.3 10*3/uL (ref 4.0–10.5)
nRBC: 0 % (ref 0.0–0.2)

## 2022-04-10 MED ORDER — PROSOURCE PLUS PO LIQD
30.0000 mL | Freq: Two times a day (BID) | ORAL | Status: DC
  Administered 2022-04-13 – 2022-04-22 (×4): 30 mL via ORAL
  Filled 2022-04-10 (×17): qty 30

## 2022-04-10 NOTE — Progress Notes (Signed)
PROGRESS NOTE        PATIENT DETAILS Name: Darlene Barber Age: 57 y.o. Sex: female Date of Birth: 11/22/1965 Admit Date: 03/10/2022 Admitting Physician Rigoberto Noel, MD HUT:[id number], Chrystie Nose, MD  Brief Summary: Patient is a 57 y.o.  female who was initially admitted to the ICU with septic shock-respiratory failure requiring pressors/intubation on initial presentation.  She was subsequently was found to have Streptococcus bacteremia.  Hospital course was complicated by AKI requiring CRRT and acute metabolic encephalopathy/ICU delirium.  She was stabilized and transferred to [facility] on 1/17.  Subsequent hospital course complicated by lower GI bleeding with acute blood loss anemia-requiring transfer back to the ICU.  Ultimately she was found to have large ulcers in her entire colon.  She required multiple units of PRBC transfusion.  She was transferred back to the [facility] service on 1/24.  Significant events: 12/31>> admit to ICU-septic shock-intubated-required pressors stool studies/COVID/RSV/flu negative.  Blood cultures positive for strep 01/3 CRRT 01/5>> TEE done with no evidence of vegetations 01/11>>extubated, transferred from [facility] for iHD 01/17>> transfer to [facility] services 01/18>> developed lower GI bleeding-GI consulted-PRBC transfused 01/18>> transfer back to the ICU 01/20>> colonoscopy with multiple ulcers in colon. 01/24>> transferred to [facility]  Significant studies: 12/30>> CT chest/abdomen/pelvis: Fluid throughout stomach/small bowel/colon-nonspecific 01/05>> CT chest/abdomen/pelvis: Persistent areas of wall thickening along the colon, patchy bilateral ill-defined lung opacities 01/19>> CT abdomen/pelvis: Diarrheal illness with fluid reaching the rectum, colonic wall thickening is improved.  Improving bilateral PNA.  Bilateral nephrolithiasis without obstruction. 01/19>> CT angio GI bleed: Limited evaluation because of oral contrast within the distal small  bowel/throughout colon-active GI bleeding could not be assessed. 01/21>> CT angio GI bleed: No active GI bleeding, multiple infectious/inflammatory colitis involving splenic flexure and ascending colon.  Significant microbiology data: 12/30>> blood culture: Streptococcus Infantarius 12/30>> stool C. difficile negative 12/30>> GI pathogen panel: Negative 01/02>> tracheal aspirate: No organisms 01/03>> blood culture: No growth 01/03>> blood culture: No growth 01/17>> blood culture: No growth  Procedures: 12/31-1/11>> ETT 1/05>> TEE: EF 60-65%-no vegetations 1/20>> colonoscopy: Blood in entire colon, multiple ulcers throughout the colon.  Consults: PCCM GI General surgery Nephrology ID  Subjective:  Bed, no headache or chest pain, mild nausea, abdominal pain is serially improving, still has some loose stools but no blood, no focal weakness.  Objective: Vitals: Blood pressure (!) 128/91, pulse (!) 105, temperature 97.8 F (36.6 C), temperature source Oral, resp. rate 16, height 4\' 10"  (1.473 m), weight 69.7 kg, SpO2 94 %.   Exam:  Awake Alert, No new F.N deficits, Normal affect [place].AT,PERRAL Supple Neck, No JVD,   Symmetrical Chest wall movement, Good air movement bilaterally, CTAB RRR,No Gallops, Rubs or new Murmurs,  +ve B.Sounds, Abd Soft, minimal tenderness No Cyanosis, Clubbing or edema      Assessment/Plan:  Septic shock likely due to GI source, required intubation due to septic shock developed ventilator associated pneumonia, Streptococcus Infantarius bacteremia   Sepsis physiology has resolved Had completed a course of antibiotics on 1/17-now back on Zosyn with Flagyl for severe colitis, continue for now TEE negative for vegetations  Lower GI bleeding due to severe colitis with multiple colon ulcers ischemic versus infectious Acute blood loss anemia Follow CBC trend (8 units PRBC from 1/18-1/21) CT angio without any localized bleeding Empirically on Zosyn  along with Flagyl clinically improving Recent stool studies negative  Underwent colonoscopy with biopsies obtained during colonoscopy showing nonspecific inflammation, GI has signed off but thought that infectious versus ischemic colitis is in differential. Gen.surgery following, CT abdomen from 04/06/2022 noted with continued infectious/inflammatory colitis, case discussed with general surgery, no surgical plans but continue conservative management with bowel rest till abdominal pain much improved, continue antibiotics. Some diarrhea overnight but patient also has history of IBS and diarrhea is not unusual for her.  Acute toxic/metabolic encephalopathy ICU delirium Multifactorial etiology-ICU delirium/encephalopathy due to sepsis/AKI Much improved-awake/alert this morning. Maintain delirium precautions Continue Seroquel 300 nightly Continue vilazodone  AKI Multifactorial etiology-felt to be ischemic ATN from septic shock-possibly some component of infectious (strep) glomerulonephritis-and now possibly contrast related (CT angio)  Avoid nephrotoxic agents Nephrology following Defer decision to resume HD/CRRT per nephrology service  Shock liver Improved Trend LFTs periodically  HTN BP stable Continue amlodipine  GERD PPI  Bipolar disorder/depression Complicated by encephalopathy Continue Seroquel/vilazodone  COPD Not in exacerbation-continue bronchodilators  Hypomagnesemia.  Replaced.    OSA CPAP when able  Nutrition Status: Nutrition Problem: Inadequate oral intake Etiology: inability to eat Signs/Symptoms: NPO status (on vent) Interventions: MVI  Obesity: Estimated body mass index is 32.12 kg/m as calculated from the following:   Height as of this encounter: 4\' 10"  (1.473 m).   Weight as of this encounter: 69.7 kg.   Code status:   Code Status: Full Code   DVT Prophylaxis: Place and maintain sequential compression device Start: 03/13/22 2117 SCDs Start:  03/10/22 0232   Family Communication: Son at bedside by previous MD on 04/05/2022   Disposition Plan: Status is: Inpatient Remains inpatient appropriate because: Severity of illness   Planned Discharge Destination:Skilled nursing facility   Diet: Diet Order             DIET SOFT Fluid consistency: Thin  Diet effective now                    MEDICATIONS: Scheduled Meds:  acetaminophen  1,000 mg Oral Q6H   amLODipine  10 mg Oral Daily   Chlorhexidine Gluconate Cloth  6 each Topical Daily   fluticasone furoate-vilanterol  1 puff Inhalation Daily   And   umeclidinium bromide  1 puff Inhalation Daily   lidocaine  1 patch Transdermal Q24H   methocarbamol  1,000 mg Oral Q8H   multivitamin  1 tablet Oral QHS   nortriptyline  50 mg Oral QHS   mouth rinse  15 mL Mouth Rinse 4 times per day   pantoprazole (PROTONIX) IV  40 mg Intravenous Q24H   QUEtiapine  300 mg Oral QHS   sodium bicarbonate  650 mg Oral BID   Vilazodone HCl  40 mg Oral Daily   Continuous Infusions:  sodium chloride 10 mL/hr at 04/10/22 0638   dextrose 5 % and 0.45% NaCl Stopped (03/31/22 0730)   metronidazole 500 mg (04/10/22 0039)   piperacillin-tazobactam (ZOSYN)  IV 2.25 g (04/10/22 0640)   PRN Meds:.sodium chloride, albuterol, docusate, haloperidol lactate, labetalol, lip balm, LORazepam, mineral oil-hydrophilic petrolatum, ondansetron (ZOFRAN) IV, mouth rinse, oxyCODONE, polyethylene glycol, promethazine, sodium chloride flush   I have personally reviewed following labs and imaging studies  LABORATORY DATA:  Recent Labs  Lab 04/06/22 0330 04/07/22 0440 04/08/22 0414 04/09/22 0449 04/10/22 0508  WBC 10.7* 10.6* 8.6 8.3 9.3  HGB 12.0 11.7* 11.7* 11.4* 11.8*  HCT 36.1 35.4* 35.2* 36.1 35.8*  PLT 388 346 305 289 261  MCV 91.2 91.0 91.2 92.6 91.6  MCH  30.3 30.1 30.3 29.2 30.2  MCHC 33.2 33.1 33.2 31.6 33.0  RDW 16.5* 16.6* 16.4* 16.6* 16.4*    Recent Labs  Lab 04/06/22 0330  04/07/22 0440 04/08/22 0414 04/09/22 0449 04/10/22 0508  NA 137 137 136 136 133*  K 3.9 4.1 4.3 4.0 4.0  CL 107 108 104 106 104  CO2 22 20* 20* 20* 18*  ANIONGAP 8 9 12 10 11   GLUCOSE 112* 88 83 94 105*  BUN 30* 27* 28* 29* 29*  CREATININE 4.26* 4.33* 4.52* 4.61* 4.85*  ALBUMIN 1.8* 1.8* 1.8* 1.7* 1.6*  CRP 2.6* 2.5* 3.6* 4.0*  --   BNP 89.4 66.3 43.6 40.1  --   MG 1.6* 2.5* 2.4 2.0  --   CALCIUM 8.4* 8.4* 8.6* 8.3* 8.4*    MICROBIOLOGY: No results found for this or any previous visit (from the past 240 hour(s)).   RADIOLOGY STUDIES/RESULTS: No results found.   LOS: 31 days   Signature  -    Lala Lund M.D on 04/10/2022 at 10:39 AM   -  To page go to [email or web address]

## 2022-04-10 NOTE — Progress Notes (Signed)
Physical Therapy Treatment Patient Details Name: Darlene Barber MRN: [id number] DOB: 03-Jun-1965 Today's Date: 04/10/2022   History of Present Illness 57 yo female admitted 12/30 to [facility] with abdominal pain and rash with sepsis and AKI. Respiratory failure intubated 1/2-1/11. CRRT (1/3-1/8, 1/10-1/11). pt also with metabolic encephalopathy acutely. PMhx: chronic pain, GERD, bipolar, tobacco and THC abuse, insomnia, HTN, tremor    PT Comments    Pt seen for PT tx with pt agreeable with encouragement. Pt endorses BLE hip pain throughout session but notes this is chronic x ~6 years. Pt is able to complete bed mobility with mod I & STS with supervision with cuing for hand placement. Pt ambulates in room with RW with cuing to ambulate within AD, with slow steady gait, & ability to verbalize when she needs to take seated rest breaks. Educated pt on recommendation of SNF rehab upon d/c to maximize independence with functional mobility & reduce fall risk prior to return home. Will continue to follow pt acutely to address balance, gait, and stair negotiation.    Recommendations for follow up therapy are one component of a multi-disciplinary discharge planning process, led by the attending physician.  Recommendations may be updated based on patient status, additional functional criteria and insurance authorization.  Follow Up Recommendations  Skilled nursing-short term rehab (<3 hours/day) Can patient physically be transported by private vehicle: Yes   Assistance Recommended at Discharge Frequent or constant Supervision/Assistance  Patient can return home with the following A little help with walking and/or transfers;A little help with bathing/dressing/bathroom;Assistance with cooking/housework;Help with stairs or ramp for entrance   Equipment Recommendations  Rolling walker (2 wheels)    Recommendations for Other Services       Precautions / Restrictions Precautions Precautions:  Fall Restrictions Weight Bearing Restrictions: No     Mobility  Bed Mobility Overal bed mobility: Modified Independent Bed Mobility: Supine to Sit, Sit to Supine     Supine to sit: Modified independent (Device/Increase time), HOB elevated Sit to supine: Modified independent (Device/Increase time), HOB elevated        Transfers Overall transfer level: Needs assistance Equipment used: Rolling walker (2 wheels) Transfers: Sit to/from Stand Sit to Stand: Supervision           General transfer comment: Cuing re: hand placement    Ambulation/Gait Ambulation/Gait assistance: Min guard, Supervision Gait Distance (Feet): 20 Feet (+ 5 ft + 15 ft) Assistive device: Rolling walker (2 wheels) Gait Pattern/deviations: Step-through pattern, Decreased step length - right, Decreased step length - left, Decreased stride length Gait velocity: decreased     General Gait Details: Cuing to ambulate within base of AD with good return demo. Pt with slow, steady gait, takes rest breaks PRN.   Stairs             Wheelchair Mobility    Modified Rankin (Stroke Patients Only)       Balance Overall balance assessment: Needs assistance Sitting-balance support: No upper extremity supported, Feet unsupported Sitting balance-Leahy Scale: Fair Sitting balance - Comments: supervision sitting EOB   Standing balance support: Bilateral upper extremity supported, During functional activity, Reliant on assistive device for balance Standing balance-Leahy Scale: [place]  Exercises General Exercises - Lower Extremity Long Arc Quad: AROM, Seated, Strengthening, Both, 10 reps    General Comments        Pertinent Vitals/Pain Pain Assessment Pain Assessment: Faces Faces Pain Scale: Hurts whole lot Pain Location: chronic BLE hip pain Pain Descriptors / Indicators:  Discomfort, Grimacing, Guarding Pain Intervention(s): Monitored during session    Home Living                          Prior Function            PT Goals (current goals can now be found in the care plan section) Acute Rehab PT Goals Patient Stated Goal: to get better PT Goal Formulation: With family Time For Goal Achievement: 04/17/22 Potential to Achieve Goals: Good Progress towards PT goals: Progressing toward goals    Frequency    Min 2X/week      PT Plan Current plan remains appropriate    Co-evaluation              AM-PAC PT "6 Clicks" Mobility   Outcome Measure  Help needed turning from your back to your side while in a flat bed without using bedrails?: None Help needed moving from lying on your back to sitting on the side of a flat bed without using bedrails?: A Little Help needed moving to and from a bed to a chair (including a wheelchair)?: A Little Help needed standing up from a chair using your arms (e.g., wheelchair or bedside chair)?: A Little Help needed to walk in hospital room?: A Little Help needed climbing 3-5 steps with a railing? : A Lot 6 Click Score: 18    End of Session   Activity Tolerance: Patient tolerated treatment well Patient left: in bed;with call bell/phone within reach;with nursing/sitter in room Nurse Communication: Mobility status PT Visit Diagnosis: Muscle weakness (generalized) (M62.81);Difficulty in walking, not elsewhere classified (R26.2)     Time: 0626-9485 PT Time Calculation (min) (ACUTE ONLY): 25 min  Charges:  $Therapeutic Activity: 23-37 mins                     Lavone Nian, PT, DPT 04/10/22, 12:35 PM   Waunita Schooner 04/10/2022, 12:33 PM

## 2022-04-10 NOTE — TOC Progression Note (Signed)
Transition of Care [facility]) - Progression Note    Patient Details  Name: Darlene Barber MRN: [id number] Date of Birth: [birth year]  Transition of [facility]) CM/SW [place], LCSW Phone Number: 04/10/2022, 5:00 PM  Clinical Narrative:    Mendel Corning requested additional clinical. CSW provided it.   Expected Discharge Plan: Skilled Nursing Facility Barriers to Discharge: Ship broker, Continued Medical Work up, SNF Pending bed offer  Expected Discharge Plan and Services In-house Referral: Clinical Social Work     Living arrangements for the past 2 months: Single Family Home                                       Social Determinants of Health (SDOH) Interventions SDOH Screenings   Depression (PHQ2-9): High Risk (09/03/2021)  Tobacco Use: High Risk (04/01/2022)    Readmission Risk Interventions    03/11/2022   12:18 PM  Readmission Risk Prevention Plan  Transportation Screening Complete  Medication Review ([place]) Complete  PCP or Specialist appointment within 3-5 days of discharge Complete  HRI or [place] Complete  SW Recovery Care/Counseling Consult Complete  [place] Not Applicable

## 2022-04-11 DIAGNOSIS — A419 Sepsis, unspecified organism: Secondary | ICD-10-CM | POA: Diagnosis not present

## 2022-04-11 DIAGNOSIS — R6521 Severe sepsis with septic shock: Secondary | ICD-10-CM | POA: Diagnosis not present

## 2022-04-11 LAB — CBC WITH DIFFERENTIAL/PLATELET
Abs Immature Granulocytes: 0.04 10*3/uL (ref 0.00–0.07)
Basophils Absolute: 0.1 10*3/uL (ref 0.0–0.1)
Basophils Relative: 1 %
Eosinophils Absolute: 0.1 10*3/uL (ref 0.0–0.5)
Eosinophils Relative: 1 %
HCT: 35.3 % — ABNORMAL LOW (ref 36.0–46.0)
Hemoglobin: 11.7 g/dL — ABNORMAL LOW (ref 12.0–15.0)
Immature Granulocytes: 1 %
Lymphocytes Relative: 22 %
Lymphs Abs: 1.9 10*3/uL (ref 0.7–4.0)
MCH: 30.4 pg (ref 26.0–34.0)
MCHC: 33.1 g/dL (ref 30.0–36.0)
MCV: 91.7 fL (ref 80.0–100.0)
Monocytes Absolute: 0.7 10*3/uL (ref 0.1–1.0)
Monocytes Relative: 8 %
Neutro Abs: 5.9 10*3/uL (ref 1.7–7.7)
Neutrophils Relative %: 67 %
Platelets: 258 10*3/uL (ref 150–400)
RBC: 3.85 MIL/uL — ABNORMAL LOW (ref 3.87–5.11)
RDW: 16.4 % — ABNORMAL HIGH (ref 11.5–15.5)
WBC: 8.6 10*3/uL (ref 4.0–10.5)
nRBC: 0 % (ref 0.0–0.2)

## 2022-04-11 LAB — BRAIN NATRIURETIC PEPTIDE: B Natriuretic Peptide: 20.6 pg/mL (ref 0.0–100.0)

## 2022-04-11 LAB — MAGNESIUM: Magnesium: 1.7 mg/dL (ref 1.7–2.4)

## 2022-04-11 LAB — BASIC METABOLIC PANEL
Anion gap: 11 (ref 5–15)
BUN: 23 mg/dL — ABNORMAL HIGH (ref 6–20)
CO2: 19 mmol/L — ABNORMAL LOW (ref 22–32)
Calcium: 8.2 mg/dL — ABNORMAL LOW (ref 8.9–10.3)
Chloride: 104 mmol/L (ref 98–111)
Creatinine, Ser: 4.89 mg/dL — ABNORMAL HIGH (ref 0.44–1.00)
GFR, Estimated: 10 mL/min — ABNORMAL LOW (ref >60)
Glucose, Bld: 93 mg/dL (ref 70–99)
Potassium: 3.8 mmol/L (ref 3.5–5.1)
Sodium: 134 mmol/L — ABNORMAL LOW (ref 135–145)

## 2022-04-11 NOTE — Progress Notes (Signed)
OT Cancellation Note  Patient Details Name: Darlene Barber MRN: [id number] DOB: [birth year]   Cancelled Treatment:    Reason Eval/Treat Not Completed: Fatigue/lethargy limiting ability to participate (Pt reports that she has just walked around the bed and back with nursing and received muscle relaxer afterward due to back spasms. Would like to rest; will return as schedule allows.)  Elder Cyphers, OTR/L [facility] Acute Rehabilitation Office: [phone]   Magnus Ivan 04/11/2022, 2:07 PM

## 2022-04-11 NOTE — Progress Notes (Signed)
PROGRESS NOTE        PATIENT DETAILS Name: Darlene Barber Age: 57 y.o. Sex: female Date of Birth: 1965/07/17 Admit Date: 03/10/2022 Admitting Physician Rigoberto Noel, MD MHD:[id number], Chrystie Nose, MD  Brief Summary: Patient is a 57 y.o.  female who was initially admitted to the ICU with septic shock-respiratory failure requiring pressors/intubation on initial presentation.  She was subsequently was found to have Streptococcus bacteremia.  Hospital course was complicated by AKI requiring CRRT and acute metabolic encephalopathy/ICU delirium.  She was stabilized and transferred to [facility] on 1/17.  Subsequent hospital course complicated by lower GI bleeding with acute blood loss anemia-requiring transfer back to the ICU.  Ultimately she was found to have large ulcers in her entire colon.  She required multiple units of PRBC transfusion.  She was transferred back to the [facility] service on 1/24.  Significant events: 12/31>> admit to ICU-septic shock-intubated-required pressors stool studies/COVID/RSV/flu negative.  Blood cultures positive for strep 01/3 CRRT 01/5>> TEE done with no evidence of vegetations 01/11>>extubated, transferred from [facility] for iHD 01/17>> transfer to [facility] services 01/18>> developed lower GI bleeding-GI consulted-PRBC transfused 01/18>> transfer back to the ICU 01/20>> colonoscopy with multiple ulcers in colon. 01/24>> transferred to [facility]  Significant studies: 12/30>> CT chest/abdomen/pelvis: Fluid throughout stomach/small bowel/colon-nonspecific 01/05>> CT chest/abdomen/pelvis: Persistent areas of wall thickening along the colon, patchy bilateral ill-defined lung opacities 01/19>> CT abdomen/pelvis: Diarrheal illness with fluid reaching the rectum, colonic wall thickening is improved.  Improving bilateral PNA.  Bilateral nephrolithiasis without obstruction. 01/19>> CT angio GI bleed: Limited evaluation because of oral contrast within the distal small  bowel/throughout colon-active GI bleeding could not be assessed. 01/21>> CT angio GI bleed: No active GI bleeding, multiple infectious/inflammatory colitis involving splenic flexure and ascending colon.  Significant microbiology data: 12/30>> blood culture: Streptococcus Infantarius 12/30>> stool C. difficile negative 12/30>> GI pathogen panel: Negative 01/02>> tracheal aspirate: No organisms 01/03>> blood culture: No growth 01/03>> blood culture: No growth 01/17>> blood culture: No growth  Procedures: 12/31-1/11>> ETT 1/05>> TEE: EF 60-65%-no vegetations 1/20>> colonoscopy: Blood in entire colon, multiple ulcers throughout the colon.  Consults: PCCM GI General surgery Nephrology ID  Subjective:  Patient in bed denies any fever, no headache, no chest pain or shortness of breath, she does have some abdominal pain today thinks she over ate yesterday, no focal weakness.  Objective: Vitals: Blood pressure 118/80, pulse (!) 102, temperature 97.8 F (36.6 C), temperature source Oral, resp. rate 12, height 4\' 10"  (1.473 m), weight 69.7 kg, SpO2 100 %.   Exam:  Awake Alert, No new F.N deficits, Normal affect [place].AT,PERRAL Supple Neck, No JVD,   Symmetrical Chest wall movement, Good air movement bilaterally, CTAB RRR,No Gallops, Rubs or new Murmurs,  +ve B.Sounds, Abd Soft, No tenderness,   No Cyanosis, Clubbing or edema    Assessment/Plan:  Septic shock likely due to GI source, required intubation due to septic shock developed ventilator associated pneumonia, Streptococcus Infantarius bacteremia   Sepsis physiology has resolved Will stop Zosyn on 04/11/2022 after a total of 12 days, for now continue IV Flagyl and monitor clinically TEE negative for vegetations Pneumonia has clinically resolved   Lower GI bleeding due to severe colitis with multiple colon ulcers ischemic versus infectious Acute blood loss anemia Follow CBC trend (8 units PRBC from 1/18-1/21) CT angio  without any localized bleeding See antibiotics  above Recent stool studies negative Underwent colonoscopy with biopsies obtained during colonoscopy showing nonspecific inflammation, GI has signed off but thought that infectious versus ischemic colitis is in differential. Gen.surgery following, CT abdomen from 04/06/2022 noted with continued infectious/inflammatory colitis, case discussed with general surgery, no surgical plans but continue conservative management with bowel rest till abdominal pain much improved, continue antibiotics. Some diarrhea overnight but patient also has history of IBS and diarrhea is not unusual for her.  Acute toxic/metabolic encephalopathy ICU delirium Multifactorial etiology-ICU delirium/encephalopathy due to sepsis/AKI Much improved-awake/alert this morning. Maintain delirium precautions Continue Seroquel 300 nightly Continue vilazodone  AKI Multifactorial etiology-felt to be ischemic ATN from septic shock-possibly some component of infectious (strep) glomerulonephritis-and now possibly contrast related (CT angio)  Avoid nephrotoxic agents Nephrology following Defer decision to resume HD/CRRT per nephrology service  Shock liver Improved Trend LFTs periodically  HTN BP stable Continue amlodipine  GERD PPI  Bipolar disorder/depression Complicated by encephalopathy Continue Seroquel/vilazodone  COPD Not in exacerbation-continue bronchodilators  Hypomagnesemia.  Replaced.    OSA CPAP when able  Nutrition Status: Nutrition Problem: Inadequate oral intake Etiology: inability to eat Signs/Symptoms: NPO status (on vent) Interventions: MVI  Obesity: Estimated body mass index is 32.12 kg/m as calculated from the following:   Height as of this encounter: 4\' 10"  (1.473 m).   Weight as of this encounter: 69.7 kg.   Code status:   Code Status: Full Code   DVT Prophylaxis: Place and maintain sequential compression device Start: 03/13/22  2117 SCDs Start: 03/10/22 0232   Family Communication: Son at bedside by previous MD on 04/05/2022   Disposition Plan: Status is: Inpatient Remains inpatient appropriate because: Severity of illness   Planned Discharge Destination:Skilled nursing facility   Diet: Diet Order             Diet clear liquid Room service appropriate? Yes; Fluid consistency: Thin  Diet effective now                    MEDICATIONS: Scheduled Meds:  (feeding supplement) PROSource Plus  30 mL Oral BID BM   acetaminophen  1,000 mg Oral Q6H   amLODipine  10 mg Oral Daily   Chlorhexidine Gluconate Cloth  6 each Topical Daily   fluticasone furoate-vilanterol  1 puff Inhalation Daily   And   umeclidinium bromide  1 puff Inhalation Daily   lidocaine  1 patch Transdermal Q24H   methocarbamol  1,000 mg Oral Q8H   multivitamin  1 tablet Oral QHS   nortriptyline  50 mg Oral QHS   mouth rinse  15 mL Mouth Rinse 4 times per day   pantoprazole (PROTONIX) IV  40 mg Intravenous Q24H   QUEtiapine  300 mg Oral QHS   sodium bicarbonate  650 mg Oral BID   Vilazodone HCl  40 mg Oral Daily   Continuous Infusions:  sodium chloride 10 mL/hr at 04/10/22 1610   metronidazole 500 mg (04/11/22 0000)   PRN Meds:.sodium chloride, albuterol, docusate, haloperidol lactate, labetalol, lip balm, LORazepam, mineral oil-hydrophilic petrolatum, ondansetron (ZOFRAN) IV, mouth rinse, oxyCODONE, polyethylene glycol, promethazine, sodium chloride flush   I have personally reviewed following labs and imaging studies  LABORATORY DATA:  Recent Labs  Lab 04/07/22 0440 04/08/22 0414 04/09/22 0449 04/10/22 0508 04/11/22 0229  WBC 10.6* 8.6 8.3 9.3 8.6  HGB 11.7* 11.7* 11.4* 11.8* 11.7*  HCT 35.4* 35.2* 36.1 35.8* 35.3*  PLT 346 305 289 261 258  MCV 91.0 91.2 92.6 91.6 91.7  MCH 30.1 30.3 29.2 30.2 30.4  MCHC 33.1 33.2 31.6 33.0 33.1  RDW 16.6* 16.4* 16.6* 16.4* 16.4*  LYMPHSABS  --   --   --   --  1.9  MONOABS  --    --   --   --  0.7  EOSABS  --   --   --   --  0.1  BASOSABS  --   --   --   --  0.1    Recent Labs  Lab 04/06/22 0330 04/07/22 0440 04/08/22 0414 04/09/22 0449 04/10/22 0508 04/11/22 0229  NA 137 137 136 136 133* 134*  K 3.9 4.1 4.3 4.0 4.0 3.8  CL 107 108 104 106 104 104  CO2 22 20* 20* 20* 18* 19*  ANIONGAP 8 9 12 10 11 11   GLUCOSE 112* 88 83 94 105* 93  BUN 30* 27* 28* 29* 29* 23*  CREATININE 4.26* 4.33* 4.52* 4.61* 4.85* 4.89*  ALBUMIN 1.8* 1.8* 1.8* 1.7* 1.6*  --   CRP 2.6* 2.5* 3.6* 4.0*  --   --   BNP 89.4 66.3 43.6 40.1  --  20.6  MG 1.6* 2.5* 2.4 2.0  --  1.7  CALCIUM 8.4* 8.4* 8.6* 8.3* 8.4* 8.2*    MICROBIOLOGY: No results found for this or any previous visit (from the past 240 hour(s)).   RADIOLOGY STUDIES/RESULTS: No results found.   LOS: 32 days   Signature  -    Lala Lund M.D on 04/11/2022 at 10:03 AM   -  To page go to [email or web address]

## 2022-04-11 NOTE — Progress Notes (Signed)
Mobility Specialist Progress Note   04/11/22 1030  Mobility  Activity Transferred to/from [facility]  Level of Assistance Standby assist, set-up cues, supervision of patient - no hands on  Assistive Device None  Range of Motion/Exercises Active;All extremities  Activity Response Tolerated well   Patient received in supine. Transferred to and from [facility] with supervision. Deferred ambulation at this time secondary to nausea and overall not feeling well. Was left in supine with all needs met, call bell in reach. Per request of pt, will follow up this afternoon for ambulation.   [place] Avyana Puffenbarger, BS EXP Mobility Specialist Please contact via SecureChat or Rehab office at [phone]

## 2022-04-11 NOTE — Progress Notes (Signed)
[facility] Progress Note  12 Days Post-Op  Subjective: CC-  Tearful this morning because her son got into a car accident last night, he's ok but car was totalled. States that this is making her feel anxious and worse. She does still have some dull abdominal pain, but it is significantly improved since admission. Ongoing nausea not related to PO intake. She reports 2-3 loose nonbloody stools yesterday, which is normal for her. WBC WNL, afebrile, h/h stable  Objective: Vital signs in last 24 hours: Temp:  [97.6 F (36.4 C)-98.3 F (36.8 C)] 97.8 F (36.6 C) (02/01 0919) Pulse Rate:  [102-105] 102 (02/01 0919) Resp:  [12-18] 12 (02/01 0423) BP: (95-135)/(57-85) 118/80 (02/01 0919) SpO2:  [5 %-100 %] 100 % (02/01 0919) Weight:  [69.7 kg] 69.7 kg (02/01 0500) Last BM Date : 04/09/22  Intake/Output from previous day: 01/31 0701 - 02/01 0700 In: 1230 [P.O.:1030; IV Piggyback:200] Out: 504 [Urine:502; Stool:2] Intake/Output this shift: No intake/output data recorded.  PE:  Gen: Alert, NAD, tearful Resp: rate and effort normal GI: diffuse tenderness but abdomen is very soft and nondistended, no rebound or guarding  Lab Results:  Recent Labs    04/10/22 0508 04/11/22 0229  WBC 9.3 8.6  HGB 11.8* 11.7*  HCT 35.8* 35.3*  PLT 261 258   BMET Recent Labs    04/10/22 0508 04/11/22 0229  NA 133* 134*  K 4.0 3.8  CL 104 104  CO2 18* 19*  GLUCOSE 105* 93  BUN 29* 23*  CREATININE 4.85* 4.89*  CALCIUM 8.4* 8.2*   PT/INR No results for input(s): "LABPROT", "INR" in the last 72 hours. CMP     Component Value Date/Time   NA 134 (L) 04/11/2022 0229   NA 137 04/20/2012 1725   K 3.8 04/11/2022 0229   K 4.2 04/20/2012 1725   CL 104 04/11/2022 0229   CL 106 04/20/2012 1725   CO2 19 (L) 04/11/2022 0229   CO2 24 04/20/2012 1725   GLUCOSE 93 04/11/2022 0229   GLUCOSE 97 04/20/2012 1725   BUN 23 (H) 04/11/2022 0229   BUN 14 04/20/2012 1725   CREATININE 4.89  (H) 04/11/2022 0229   CREATININE 0.83 09/30/2019 1057   CALCIUM 8.2 (L) 04/11/2022 0229   CALCIUM 9.2 04/20/2012 1725   PROT 5.6 (L) 03/26/2022 0734   PROT 7.9 04/20/2012 1725   ALBUMIN 1.6 (L) 04/10/2022 0508   ALBUMIN 4.0 04/20/2012 1725   AST 24 03/26/2022 0734   AST 26 04/20/2012 1725   ALT 27 03/26/2022 0734   ALT 39 04/20/2012 1725   ALKPHOS 94 03/26/2022 0734   ALKPHOS 123 04/20/2012 1725   BILITOT 0.4 03/26/2022 0734   BILITOT 0.5 04/20/2012 1725   GFRNONAA 10 (L) 04/11/2022 0229   GFRNONAA 81 09/30/2019 1057   GFRAA 93 09/30/2019 1057   Lipase     Component Value Date/Time   LIPASE 88 (H) 03/09/2022 2204       Studies/Results: No results found.  Anti-infectives: Anti-infectives (From admission, onward)    Start     Dose/Rate Route Frequency Ordered Stop   04/07/22 1200  metroNIDAZOLE (FLAGYL) IVPB 500 mg        500 mg 100 mL/hr over 60 Minutes Intravenous Every 12 hours 04/07/22 1040     04/06/22 1115  piperacillin-tazobactam (ZOSYN) IVPB 2.25 g        2.25 g 100 mL/hr over 30 Minutes Intravenous Every 8 hours 04/06/22 1016 04/12/22 1359   03/31/22 0600  piperacillin-tazobactam (ZOSYN) IVPB 2.25 g       See Hyperspace for full Linked Orders Report.   2.25 g 100 mL/hr over 30 Minutes Intravenous Every 8 hours 03/30/22 1801 04/06/22 0637   03/30/22 1845  piperacillin-tazobactam (ZOSYN) IVPB 3.375 g       See Hyperspace for full Linked Orders Report.   3.375 g 100 mL/hr over 30 Minutes Intravenous  Once 03/30/22 1801 03/30/22 1850   03/21/22 1000  cefTRIAXone (ROCEPHIN) 2 g in sodium chloride 0.9 % 100 mL IVPB        2 g 200 mL/hr over 30 Minutes Intravenous Every 24 hours 03/21/22 0740 03/28/22 0326   03/20/22 1200  piperacillin-tazobactam (ZOSYN) IVPB 3.375 g  Status:  Discontinued        3.375 g 12.5 mL/hr over 240 Minutes Intravenous Every 8 hours 03/20/22 0921 03/21/22 0740   03/19/22 1200  piperacillin-tazobactam (ZOSYN) IVPB 2.25 g  Status:   Discontinued        2.25 g 100 mL/hr over 30 Minutes Intravenous Every 8 hours 03/19/22 0931 03/20/22 0921   03/18/22 2000  piperacillin-tazobactam (ZOSYN) IVPB 3.375 g  Status:  Discontinued        3.375 g 12.5 mL/hr over 240 Minutes Intravenous Every 8 hours 03/18/22 1355 03/19/22 0931   03/16/22 1015  piperacillin-tazobactam (ZOSYN) IVPB 3.375 g  Status:  Discontinued        3.375 g 100 mL/hr over 30 Minutes Intravenous Every 6 hours 03/16/22 1007 03/18/22 1355   03/16/22 1000  piperacillin-tazobactam (ZOSYN) IVPB 3.375 g  Status:  Discontinued        3.375 g 12.5 mL/hr over 240 Minutes Intravenous Every 8 hours 03/16/22 0949 03/16/22 1007   03/12/22 1800  ampicillin (OMNIPEN) 2 g in sodium chloride 0.9 % 100 mL IVPB  Status:  Discontinued        2 g 300 mL/hr over 20 Minutes Intravenous Every 6 hours 03/12/22 1629 03/16/22 0919   03/12/22 1200  ampicillin (OMNIPEN) 2 g in sodium chloride 0.9 % 100 mL IVPB  Status:  Discontinued        2 g 300 mL/hr over 20 Minutes Intravenous 2 times daily 03/12/22 0945 03/12/22 1629   03/12/22 0600  vancomycin (VANCOREADY) IVPB 500 mg/100 mL  Status:  Discontinued        500 mg 100 mL/hr over 60 Minutes Intravenous Every 48 hours 03/11/22 2256 03/11/22 2307   03/11/22 2344  vancomycin variable dose per unstable renal function (pharmacist dosing)  Status:  Discontinued         Does not apply See admin instructions 03/11/22 2344 03/12/22 0945   03/11/22 2315  vancomycin (VANCOREADY) IVPB 1750 mg/350 mL  Status:  Discontinued        1,750 mg 175 mL/hr over 120 Minutes Intravenous  Once 03/11/22 2220 03/11/22 2250   03/10/22 1200  cefTRIAXone (ROCEPHIN) 2 g in sodium chloride 0.9 % 100 mL IVPB  Status:  Discontinued        2 g 200 mL/hr over 30 Minutes Intravenous Every 24 hours 03/10/22 0934 03/12/22 0945   03/10/22 0645  vancomycin (VANCOREADY) IVPB 1500 mg/300 mL        1,500 mg 150 mL/hr over 120 Minutes Intravenous  Once 03/10/22 0547 03/10/22  0848   03/10/22 0555  vancomycin variable dose per unstable renal function (pharmacist dosing)  Status:  Discontinued         Does not apply See admin instructions 03/10/22 0555 03/10/22  6701   03/10/22 0400  ceFEPIme (MAXIPIME) 2 g in sodium chloride 0.9 % 100 mL IVPB  Status:  Discontinued        2 g 200 mL/hr over 30 Minutes Intravenous Every 24 hours 03/10/22 0303 03/10/22 0931   03/10/22 0345  metroNIDAZOLE (FLAGYL) IVPB 500 mg  Status:  Discontinued        500 mg 100 mL/hr over 60 Minutes Intravenous Every 12 hours 03/10/22 0246 03/10/22 0931        Assessment/Plan GI bleeding secondary to colitis with ulcerations - s/p colonoscopy, Biopsies negative for acute infection or inflammatory bowel disease   - s/p transfusion 6 units from 1/20-1/21 - CTA 1/21 without localized bleeding  - repeat CT 1/27 with diffuse colitis - WBC WNL, afebrile, VSS, and h/h stable. No longer having bloody bowel movements. Clinically patient has significantly improved, although still with some dull abdominal pain and nausea. No indication for acute surgical intervention. Continue medical management, ok to advance diet from our standpoint.  We will sign off, please call us back with any questions/concerns or if patient worsens.  FEN: CLD   VTE: SCDs, per primary ID: zosyn, flagyl   I reviewed hospitalist notes, last 24 h vitals and pain scores, last 48 h intake and output, and last 24 h labs and trends.    LOS: 32 days    [facility] 04/11/2022, 9:56 AM Please see Amion for pager number during day hours 7:00am-4:30pm

## 2022-04-12 DIAGNOSIS — R6521 Severe sepsis with septic shock: Secondary | ICD-10-CM | POA: Diagnosis not present

## 2022-04-12 DIAGNOSIS — A419 Sepsis, unspecified organism: Secondary | ICD-10-CM | POA: Diagnosis not present

## 2022-04-12 LAB — CBC WITH DIFFERENTIAL/PLATELET
Abs Immature Granulocytes: 0.04 10*3/uL (ref 0.00–0.07)
Basophils Absolute: 0.1 10*3/uL (ref 0.0–0.1)
Basophils Relative: 1 %
Eosinophils Absolute: 0.1 10*3/uL (ref 0.0–0.5)
Eosinophils Relative: 1 %
HCT: 33.7 % — ABNORMAL LOW (ref 36.0–46.0)
Hemoglobin: 11.2 g/dL — ABNORMAL LOW (ref 12.0–15.0)
Immature Granulocytes: 1 %
Lymphocytes Relative: 22 %
Lymphs Abs: 1.8 10*3/uL (ref 0.7–4.0)
MCH: 30.2 pg (ref 26.0–34.0)
MCHC: 33.2 g/dL (ref 30.0–36.0)
MCV: 90.8 fL (ref 80.0–100.0)
Monocytes Absolute: 0.7 10*3/uL (ref 0.1–1.0)
Monocytes Relative: 9 %
Neutro Abs: 5.6 10*3/uL (ref 1.7–7.7)
Neutrophils Relative %: 66 %
Platelets: 266 10*3/uL (ref 150–400)
RBC: 3.71 MIL/uL — ABNORMAL LOW (ref 3.87–5.11)
RDW: 16.3 % — ABNORMAL HIGH (ref 11.5–15.5)
WBC: 8.2 10*3/uL (ref 4.0–10.5)
nRBC: 0 % (ref 0.0–0.2)

## 2022-04-12 LAB — BASIC METABOLIC PANEL
Anion gap: 10 (ref 5–15)
BUN: 23 mg/dL — ABNORMAL HIGH (ref 6–20)
CO2: 16 mmol/L — ABNORMAL LOW (ref 22–32)
Calcium: 8.1 mg/dL — ABNORMAL LOW (ref 8.9–10.3)
Chloride: 108 mmol/L (ref 98–111)
Creatinine, Ser: 4.68 mg/dL — ABNORMAL HIGH (ref 0.44–1.00)
GFR, Estimated: 10 mL/min — ABNORMAL LOW (ref >60)
Glucose, Bld: 82 mg/dL (ref 70–99)
Potassium: 4 mmol/L (ref 3.5–5.1)
Sodium: 134 mmol/L — ABNORMAL LOW (ref 135–145)

## 2022-04-12 LAB — C-REACTIVE PROTEIN: CRP: 3.1 mg/dL — ABNORMAL HIGH (ref <1.0)

## 2022-04-12 LAB — MAGNESIUM: Magnesium: 1.6 mg/dL — ABNORMAL LOW (ref 1.7–2.4)

## 2022-04-12 MED ORDER — METRONIDAZOLE 500 MG PO TABS
500.0000 mg | ORAL_TABLET | Freq: Two times a day (BID) | ORAL | Status: DC
  Administered 2022-04-12 – 2022-04-23 (×23): 500 mg via ORAL
  Filled 2022-04-12 (×23): qty 1

## 2022-04-12 MED ORDER — MAGNESIUM SULFATE 4 GM/100ML IV SOLN
4.0000 g | Freq: Once | INTRAVENOUS | Status: AC
  Administered 2022-04-12: 4 g via INTRAVENOUS
  Filled 2022-04-12: qty 100

## 2022-04-12 MED ORDER — PANTOPRAZOLE SODIUM 40 MG PO TBEC
40.0000 mg | DELAYED_RELEASE_TABLET | Freq: Every day | ORAL | Status: DC
  Administered 2022-04-13 – 2022-04-23 (×11): 40 mg via ORAL
  Filled 2022-04-12 (×12): qty 1

## 2022-04-12 NOTE — Progress Notes (Signed)
PT Cancellation Note  Patient Details Name: Darlene Barber MRN: [id number] DOB: October 16, 1965   Cancelled Treatment:    Reason Eval/Treat Not Completed: Patient declined, no reason specified.  Refused therapy stating she is too tired.  Also states she is leaving for rehab today, but is per notes tomorrow.  Pt declined bed level tx.   Ramond Dial 04/12/2022, 2:00 PM  Mee Hives, PT PhD Acute Rehab Dept. Number: [place] [facility] [place]

## 2022-04-12 NOTE — Progress Notes (Signed)
Mobility Specialist Progress Note:   04/12/22 1140  Mobility  Activity Ambulated with assistance in room  Level of Assistance Standby assist, set-up cues, supervision of patient - no hands on  Assistive Device Front wheel walker  Distance Ambulated (ft) 5 ft  Activity Response Tolerated poorly  Mobility Referral Yes  $Mobility charge 1 Mobility   Pt agreeable to minimal mobility with max encouragement. C/o significant nausea and overall exhaustion from not getting any sleep. Required no physical assistance throughout. Pt impulsively sat back down on the bed d/t nausea, stating she couldn't do any more. Left with all needs met.  Nelta Numbers Mobility Specialist Please contact via SecureChat or  Rehab office at [phone]

## 2022-04-12 NOTE — TOC Progression Note (Signed)
Transition of Care [facility]) - Progression Note    Patient Details  Name: Darlene Barber MRN: [id number] Date of Birth: [birth year]  Transition of Care [facility]) CM/SW [place], LCSW Phone Number: 04/12/2022, 1:52 PM  Clinical Narrative:    CSW met with patient to confirm discharge plan. CSW informed her that [facility] has received insurance approval. She stated she is in agreement with going to SNF and is looking forward to leaving the hospital and getting sleep. CSW made [facility] aware of likely discharge tomorrow.   Expected Discharge Plan: [place] Barriers to Discharge: Continued Medical Work up  Expected Discharge Plan and Services In-house Referral: Clinical Social Work     Living arrangements for the past 2 months: Single Family Home                                       Social Determinants of Health (SDOH) Interventions SDOH Screenings   Depression (PHQ2-9): High Risk (09/03/2021)  Tobacco Use: High Risk (04/01/2022)    Readmission Risk Interventions    03/11/2022   12:18 PM  Readmission Risk Prevention Plan  Transportation Screening Complete  Medication Review ([place]) Complete  PCP or Specialist appointment within 3-5 days of discharge Complete  HRI or [place] Complete  SW Recovery Care/Counseling Consult Complete  [place] Not Applicable

## 2022-04-12 NOTE — Progress Notes (Signed)
PROGRESS NOTE        PATIENT DETAILS Name: Darlene Barber Age: 57 y.o. Sex: female Date of Birth: 03/14/1965 Admit Date: 03/10/2022 Admitting Physician Rigoberto Noel, MD VZD:GLOVFIEP, Chrystie Nose, MD  Brief Summary: Patient is a 57 y.o.  female who was initially admitted to the ICU with septic shock-respiratory failure requiring pressors/intubation on initial presentation.  She was subsequently was found to have Streptococcus bacteremia.  Hospital course was complicated by AKI requiring CRRT and acute metabolic encephalopathy/ICU delirium.  She was stabilized and transferred to [facility] on 1/17.  Subsequent hospital course complicated by lower GI bleeding with acute blood loss anemia-requiring transfer back to the ICU.  Ultimately she was found to have large ulcers in her entire colon.  She required multiple units of PRBC transfusion.  She was transferred back to the [facility] service on 1/24.  Significant events: 12/31>> admit to ICU-septic shock-intubated-required pressors stool studies/COVID/RSV/flu negative.  Blood cultures positive for strep 01/3 CRRT 01/5>> TEE done with no evidence of vegetations 01/11>>extubated, transferred from [facility] for iHD 01/17>> transfer to [facility] services 01/18>> developed lower GI bleeding-GI consulted-PRBC transfused 01/18>> transfer back to the ICU 01/20>> colonoscopy with multiple ulcers in colon. 01/24>> transferred to [facility]  Significant studies: 12/30>> CT chest/abdomen/pelvis: Fluid throughout stomach/small bowel/colon-nonspecific 01/05>> CT chest/abdomen/pelvis: Persistent areas of wall thickening along the colon, patchy bilateral ill-defined lung opacities 01/19>> CT abdomen/pelvis: Diarrheal illness with fluid reaching the rectum, colonic wall thickening is improved.  Improving bilateral PNA.  Bilateral nephrolithiasis without obstruction. 01/19>> CT angio GI bleed: Limited evaluation because of oral contrast within the distal small  bowel/throughout colon-active GI bleeding could not be assessed. 01/21>> CT angio GI bleed: No active GI bleeding, multiple infectious/inflammatory colitis involving splenic flexure and ascending colon.  Significant microbiology data: 12/30>> blood culture: Streptococcus Infantarius 12/30>> stool C. difficile negative 12/30>> GI pathogen panel: Negative 01/02>> tracheal aspirate: No organisms 01/03>> blood culture: No growth 01/03>> blood culture: No growth 01/17>> blood culture: No growth  Procedures: 12/31-1/11>> ETT 1/05>> TEE: EF 60-65%-no vegetations 1/20>> colonoscopy: Blood in entire colon, multiple ulcers throughout the colon.  Consults: PCCM GI General surgery Nephrology ID  Subjective:  Patient in bed, appears comfortable, denies any headache, no fever, no chest pain or pressure, no shortness of breath , much improvedabdominal pain. No new focal weakness.   Objective: Vitals: Blood pressure 113/71, pulse 100, temperature 98.2 F (36.8 C), temperature source Oral, resp. rate 19, height 4\' 10"  (1.473 m), weight 67.7 kg, SpO2 98 %.   Exam:  Awake Alert, No new F.N deficits, Normal affect [place].AT,PERRAL Supple Neck, No JVD,   Symmetrical Chest wall movement, Good air movement bilaterally, CTAB RRR,No Gallops, Rubs or new Murmurs,  +ve B.Sounds, Abd Soft, No tenderness,   No Cyanosis, Clubbing or edema    Assessment/Plan:  Septic shock likely due to GI source, required intubation due to septic shock developed ventilator associated pneumonia, Streptococcus Infantarius bacteremia   Sepsis physiology has resolved Will stop Zosyn on 04/11/2022 after a total of 12 days, for now continue IV Flagyl and monitor clinically TEE negative for vegetations Pneumonia has clinically resolved   Lower GI bleeding due to severe colitis with multiple colon ulcers ischemic versus infectious Acute blood loss anemia Follow CBC trend (8 units PRBC from 1/18-1/21) CT angio without  any localized bleeding See antibiotics above Recent stool  studies negative Underwent colonoscopy with biopsies obtained during colonoscopy showing nonspecific inflammation, GI has signed off but thought that infectious versus ischemic colitis is in differential. Gen.surgery following, CT abdomen from 04/06/2022 noted with continued infectious/inflammatory colitis, case discussed with general surgery, no surgical plans but continue conservative management with bowel rest till abdominal pain much improved, continue antibiotics. Some diarrhea overnight but patient also has history of IBS and diarrhea is not unusual for her.  Acute toxic/metabolic encephalopathy ICU delirium Multifactorial etiology-ICU delirium/encephalopathy due to sepsis/AKI Much improved-awake/alert this morning. Maintain delirium precautions Continue Seroquel 300 nightly Continue vilazodone  AKI Multifactorial etiology-felt to be ischemic ATN from septic shock-possibly some component of infectious (strep) glomerulonephritis-and now possibly contrast related (CT angio)  Avoid nephrotoxic agents Nephrology following Defer decision to resume HD/CRRT per nephrology service  Shock liver Improved Trend LFTs periodically  HTN BP stable Continue amlodipine  GERD PPI  Bipolar disorder/depression Complicated by encephalopathy Continue Seroquel/vilazodone  COPD Not in exacerbation-continue bronchodilators  Hypomagnesemia.  Replaced.    OSA CPAP when able  Nutrition Status: Nutrition Problem: Inadequate oral intake Etiology: inability to eat Signs/Symptoms: NPO status (on vent) Interventions: MVI  Obesity: Estimated body mass index is 31.2 kg/m as calculated from the following:   Height as of this encounter: 4\' 10"  (1.473 m).   Weight as of this encounter: 67.7 kg.   Code status:   Code Status: Full Code   DVT Prophylaxis: Place and maintain sequential compression device Start: 03/13/22 2117 SCDs  Start: 03/10/22 0232   Family Communication: Son at bedside by previous MD on 04/05/2022   Disposition Plan: Status is: Inpatient Remains inpatient appropriate because: Severity of illness   Planned Discharge Destination:Skilled nursing facility   Diet: Diet Order             DIET SOFT Room service appropriate? No; Fluid consistency: Thin  Diet effective now                    MEDICATIONS: Scheduled Meds:  (feeding supplement) PROSource Plus  30 mL Oral BID BM   acetaminophen  1,000 mg Oral Q6H   amLODipine  10 mg Oral Daily   Chlorhexidine Gluconate Cloth  6 each Topical Daily   fluticasone furoate-vilanterol  1 puff Inhalation Daily   And   umeclidinium bromide  1 puff Inhalation Daily   lidocaine  1 patch Transdermal Q24H   methocarbamol  1,000 mg Oral Q8H   metroNIDAZOLE  500 mg Oral Q12H   multivitamin  1 tablet Oral QHS   nortriptyline  50 mg Oral QHS   mouth rinse  15 mL Mouth Rinse 4 times per day   pantoprazole  40 mg Oral Daily   QUEtiapine  300 mg Oral QHS   sodium bicarbonate  650 mg Oral BID   Vilazodone HCl  40 mg Oral Daily   Continuous Infusions:  sodium chloride 10 mL/hr at 04/10/22 0638   PRN Meds:.sodium chloride, albuterol, docusate, haloperidol lactate, labetalol, lip balm, LORazepam, mineral oil-hydrophilic petrolatum, ondansetron (ZOFRAN) IV, mouth rinse, oxyCODONE, polyethylene glycol, promethazine, sodium chloride flush   I have personally reviewed following labs and imaging studies  LABORATORY DATA:  Recent Labs  Lab 04/08/22 0414 04/09/22 0449 04/10/22 0508 04/11/22 0229 04/12/22 0316  WBC 8.6 8.3 9.3 8.6 8.2  HGB 11.7* 11.4* 11.8* 11.7* 11.2*  HCT 35.2* 36.1 35.8* 35.3* 33.7*  PLT 305 289 261 258 266  MCV 91.2 92.6 91.6 91.7 90.8  MCH 30.3 29.2 30.2 30.4  30.2  MCHC 33.2 31.6 33.0 33.1 33.2  RDW 16.4* 16.6* 16.4* 16.4* 16.3*  LYMPHSABS  --   --   --  1.9 1.8  MONOABS  --   --   --  0.7 0.7  EOSABS  --   --   --  0.1 0.1   BASOSABS  --   --   --  0.1 0.1    Recent Labs  Lab 04/06/22 0330 04/07/22 0440 04/08/22 0414 04/09/22 0449 04/10/22 0508 04/11/22 0229 04/12/22 0316  NA 137 137 136 136 133* 134* 134*  K 3.9 4.1 4.3 4.0 4.0 3.8 4.0  CL 107 108 104 106 104 104 108  CO2 22 20* 20* 20* 18* 19* 16*  ANIONGAP 8 9 12 10 11 11 10   GLUCOSE 112* 88 83 94 105* 93 82  BUN 30* 27* 28* 29* 29* 23* 23*  CREATININE 4.26* 4.33* 4.52* 4.61* 4.85* 4.89* 4.68*  ALBUMIN 1.8* 1.8* 1.8* 1.7* 1.6*  --   --   CRP 2.6* 2.5* 3.6* 4.0*  --   --  3.1*  BNP 89.4 66.3 43.6 40.1  --  20.6  --   MG 1.6* 2.5* 2.4 2.0  --  1.7 1.6*  CALCIUM 8.4* 8.4* 8.6* 8.3* 8.4* 8.2* 8.1*    MICROBIOLOGY: No results found for this or any previous visit (from the past 240 hour(s)).   RADIOLOGY STUDIES/RESULTS: No results found.   LOS: 33 days   Signature  -    Lala Lund M.D on 04/12/2022 at 10:39 AM   -  To page go to [email or web address]

## 2022-04-13 DIAGNOSIS — A419 Sepsis, unspecified organism: Secondary | ICD-10-CM | POA: Diagnosis not present

## 2022-04-13 DIAGNOSIS — R6521 Severe sepsis with septic shock: Secondary | ICD-10-CM | POA: Diagnosis not present

## 2022-04-13 LAB — MAGNESIUM: Magnesium: 2.4 mg/dL (ref 1.7–2.4)

## 2022-04-13 LAB — CBC WITH DIFFERENTIAL/PLATELET
Abs Immature Granulocytes: 0.04 10*3/uL (ref 0.00–0.07)
Basophils Absolute: 0 10*3/uL (ref 0.0–0.1)
Basophils Relative: 1 %
Eosinophils Absolute: 0 10*3/uL (ref 0.0–0.5)
Eosinophils Relative: 0 %
HCT: 33.5 % — ABNORMAL LOW (ref 36.0–46.0)
Hemoglobin: 11.3 g/dL — ABNORMAL LOW (ref 12.0–15.0)
Immature Granulocytes: 1 %
Lymphocytes Relative: 25 %
Lymphs Abs: 1.8 10*3/uL (ref 0.7–4.0)
MCH: 30.5 pg (ref 26.0–34.0)
MCHC: 33.7 g/dL (ref 30.0–36.0)
MCV: 90.3 fL (ref 80.0–100.0)
Monocytes Absolute: 0.8 10*3/uL (ref 0.1–1.0)
Monocytes Relative: 11 %
Neutro Abs: 4.5 10*3/uL (ref 1.7–7.7)
Neutrophils Relative %: 62 %
Platelets: 284 10*3/uL (ref 150–400)
RBC: 3.71 MIL/uL — ABNORMAL LOW (ref 3.87–5.11)
RDW: 16.5 % — ABNORMAL HIGH (ref 11.5–15.5)
WBC: 7.2 10*3/uL (ref 4.0–10.5)
nRBC: 0 % (ref 0.0–0.2)

## 2022-04-13 LAB — BASIC METABOLIC PANEL
Anion gap: 11 (ref 5–15)
BUN: 23 mg/dL — ABNORMAL HIGH (ref 6–20)
CO2: 15 mmol/L — ABNORMAL LOW (ref 22–32)
Calcium: 8.3 mg/dL — ABNORMAL LOW (ref 8.9–10.3)
Chloride: 107 mmol/L (ref 98–111)
Creatinine, Ser: 4.53 mg/dL — ABNORMAL HIGH (ref 0.44–1.00)
GFR, Estimated: 11 mL/min — ABNORMAL LOW (ref >60)
Glucose, Bld: 78 mg/dL (ref 70–99)
Potassium: 4.4 mmol/L (ref 3.5–5.1)
Sodium: 133 mmol/L — ABNORMAL LOW (ref 135–145)

## 2022-04-13 LAB — GLUCOSE, CAPILLARY: Glucose-Capillary: 77 mg/dL (ref 70–99)

## 2022-04-13 LAB — C-REACTIVE PROTEIN: CRP: 2.8 mg/dL — ABNORMAL HIGH (ref <1.0)

## 2022-04-13 MED ORDER — RENA-VITE PO TABS: 1.0000 | ORAL_TABLET | Freq: Every day | ORAL | 0 refills | Status: DC

## 2022-04-13 MED ORDER — SODIUM BICARBONATE 650 MG PO TABS: 650.0000 mg | ORAL_TABLET | Freq: Two times a day (BID) | ORAL | Status: DC

## 2022-04-13 MED ORDER — PANTOPRAZOLE SODIUM 40 MG PO TBEC: 40.0000 mg | DELAYED_RELEASE_TABLET | Freq: Every day | ORAL | Status: DC

## 2022-04-13 MED ORDER — PROSOURCE PLUS PO LIQD: 30.0000 mL | Freq: Two times a day (BID) | ORAL | Status: DC

## 2022-04-13 MED ORDER — OXYCODONE HCL 5 MG PO TABS: 5.0000 mg | ORAL_TABLET | ORAL | 0 refills | Status: DC | PRN

## 2022-04-13 MED ORDER — LIDOCAINE 5 % EX PTCH: 1.0000 | MEDICATED_PATCH | CUTANEOUS | 0 refills | Status: DC

## 2022-04-13 MED ORDER — METRONIDAZOLE 500 MG PO TABS: 500.0000 mg | ORAL_TABLET | Freq: Two times a day (BID) | ORAL | Status: DC

## 2022-04-13 NOTE — Progress Notes (Signed)
Mobility Specialist Progress Note:   04/13/22 1120  Mobility  Activity Transferred to/from [facility]  Level of Assistance Standby assist, set-up cues, supervision of patient - no hands on  Assistive Device None  Distance Ambulated (ft) 5 ft  Activity Response Tolerated poorly  Mobility Referral Yes  $Mobility charge 1 Mobility   Pt c/o significant nausea and fear of d/c to SNF today. Declines any ambulation, however agreed to transfer to [facility] to void. No physical assistance required. Pt back in bed with all needs met, encouraged frequent OOB mobility.  Nelta Numbers Mobility Specialist Please contact via SecureChat or  Rehab office at [phone]

## 2022-04-13 NOTE — Plan of Care (Signed)
  Problem: Education: Goal: Knowledge of General Education information will improve Description: Including pain rating scale, medication(s)/side effects and non-pharmacologic comfort measures Outcome: Progressing   Problem: Health Behavior/Discharge Planning: Goal: Ability to manage health-related needs will improve Outcome: Progressing   Problem: Clinical Measurements: Goal: Ability to maintain clinical measurements within normal limits will improve Outcome: Progressing Goal: Will remain free from infection Outcome: Progressing Goal: Diagnostic test results will improve Outcome: Progressing Goal: Respiratory complications will improve Outcome: Progressing Goal: Cardiovascular complication will be avoided Outcome: Progressing   Problem: Activity: Goal: Risk for activity intolerance will decrease Outcome: Progressing   Problem: Nutrition: Goal: Adequate nutrition will be maintained Outcome: Progressing   Problem: Coping: Goal: Level of anxiety will decrease Outcome: Progressing   Problem: Elimination: Goal: Will not experience complications related to bowel motility Outcome: Progressing Goal: Will not experience complications related to urinary retention Outcome: Progressing   Problem: Pain Managment: Goal: General experience of comfort will improve Outcome: Progressing   Problem: Safety: Goal: Ability to remain free from injury will improve Outcome: Progressing   Problem: Skin Integrity: Goal: Risk for impaired skin integrity will decrease Outcome: Progressing   Problem: Education: Goal: Ability to describe self-care measures that may prevent or decrease complications (Diabetes Survival Skills Education) will improve Outcome: Progressing Goal: Individualized Educational Video(s) Outcome: Progressing   Problem: Coping: Goal: Ability to adjust to condition or change in health will improve Outcome: Progressing   Problem: Fluid Volume: Goal: Ability to  maintain a balanced intake and output will improve Outcome: Progressing   Problem: Health Behavior/Discharge Planning: Goal: Ability to identify and utilize available resources and services will improve Outcome: Progressing Goal: Ability to manage health-related needs will improve Outcome: Progressing   Problem: Metabolic: Goal: Ability to maintain appropriate glucose levels will improve Outcome: Progressing   Problem: Nutritional: Goal: Maintenance of adequate nutrition will improve Outcome: Progressing Goal: Progress toward achieving an optimal weight will improve Outcome: Progressing   Problem: Skin Integrity: Goal: Risk for impaired skin integrity will decrease Outcome: Progressing   Problem: Tissue Perfusion: Goal: Adequacy of tissue perfusion will improve Outcome: Progressing   Problem: Activity: Goal: Ability to tolerate increased activity will improve Outcome: Progressing   Problem: Respiratory: Goal: Ability to maintain a clear airway and adequate ventilation will improve Outcome: Progressing   Problem: Role Relationship: Goal: Method of communication will improve Outcome: Progressing   

## 2022-04-13 NOTE — Discharge Instructions (Addendum)
Follow with Primary MD [name], MD /SNF physician   checked next visit with your primary MD or SNF MD   Activity: As tolerated with Full fall precautions use walker/cane & assistance as needed  Disposition SNF  Diet: Soft heart healthy diet  Special Instructions: If you have smoked or chewed Tobacco  in the last 2 yrs please stop smoking, stop any regular Alcohol  and or any Recreational drug use.  On your next visit with your primary care physician please Get Medicines reviewed and adjusted.  Please request your Prim.MD to go over all Hospital Tests and Procedure/Radiological results at the follow up, please get all Hospital records sent to your Prim MD by signing hospital release before you go home.  If you experience worsening of your admission symptoms, develop shortness of breath, life threatening emergency, suicidal or homicidal thoughts you must seek medical attention immediately by calling 911 or calling your MD immediately  if symptoms less severe.  You Must read complete instructions/literature along with all the possible adverse reactions/side effects for all the Medicines you take and that have been prescribed to you. Take any new Medicines after you have completely understood and accpet all the possible adverse reactions/side effects.

## 2022-04-13 NOTE — TOC Progression Note (Addendum)
Transition of Care [facility]) - Progression Note    Patient Details  Name: Darlene Barber MRN: [id number] Date of Birth: October 30, 1965  Transition of Care [facility]) CM/SW Contact  Ina Homes, [place] Phone Number: 04/13/2022, 12:08 PM  Clinical Narrative:     SW spoke with Levada Dy and [place] ([place] [phone]) reports DON did not approve admission and they were not aware of admission today.   SW left VM with Sweta [phone]) for additional assistance.  SW left 2 VM with Nevin Bloodgood ([place] [phone])   Expected Discharge Plan: [place] Barriers to Discharge: Continued Medical Work up  Expected Discharge Plan and Services In-house Referral: Clinical Social Work     Living arrangements for the past 2 months: Single Family Home Expected Discharge Date: 04/13/22                                     Social Determinants of Health (SDOH) Interventions SDOH Screenings   Depression (PHQ2-9): High Risk (09/03/2021)  Tobacco Use: High Risk (04/01/2022)    Readmission Risk Interventions    03/11/2022   12:18 PM  Readmission Risk Prevention Plan  Transportation Screening Complete  Medication Review ([place]) Complete  PCP or Specialist appointment within 3-5 days of discharge Complete  HRI or [place] Complete  SW Recovery Care/Counseling Consult Complete  [place] Not Applicable

## 2022-04-13 NOTE — Discharge Summary (Signed)
Darlene Barber WNI:[id number] DOB: 12-10-1965 DOA: 03/10/2022  PCP: [name], MD  Admit date: 03/10/2022  Discharge date: 04/16/2022  Admitted From: Home   Disposition:  SNF   Recommendations for Outpatient Follow-up:   Follow up with PCP in 1-2 weeks  PCP Please obtain BMP/CBC, 2 view CXR in 1week,  (see Discharge instructions)   PCP Please follow up on the following pending results: Monitor CBC, BMP and magnesium closely, needs close outpatient follow-up with nephrology, GI and general surgery   Home Health: None   Equipment/Devices: None  Consultations: CCS, GI, Renal, ID Discharge Condition: Stable    CODE STATUS: Full    Diet Recommendation: Heart Healthy - Soft   Brief history of present illness from the day of admission and additional interim summary    57 y.o.  female who was initially admitted to the ICU with septic shock-respiratory failure requiring pressors/intubation on initial presentation.  She was subsequently was found to have Streptococcus bacteremia.  Hospital course was complicated by AKI requiring CRRT and acute metabolic encephalopathy/ICU delirium.  She was stabilized and transferred to [facility] on 1/17.  Subsequent hospital course complicated by lower GI bleeding with acute blood loss anemia-requiring transfer back to the ICU.  Ultimately she was found to have large ulcers in her entire colon.  She required multiple units of PRBC transfusion.  She was transferred back to the [facility] service on 1/24.   Significant events: 12/31>> admit to ICU-septic shock-intubated-required pressors stool studies/COVID/RSV/flu negative.  Blood cultures positive for strep 01/3 CRRT 01/5>> TEE done with no evidence of vegetations 01/11>>extubated, transferred from [facility] for iHD 01/17>> transfer to [facility]  services 01/18>> developed lower GI bleeding-GI consulted-PRBC transfused 01/18>> transfer back to the ICU 01/20>> colonoscopy with multiple ulcers in colon. 01/24>> transferred to [facility]   Significant studies: 12/30>> CT chest/abdomen/pelvis: Fluid throughout stomach/small bowel/colon-nonspecific 01/05>> CT chest/abdomen/pelvis: Persistent areas of wall thickening along the colon, patchy bilateral ill-defined lung opacities 01/19>> CT abdomen/pelvis: Diarrheal illness with fluid reaching the rectum, colonic wall thickening is improved.  Improving bilateral PNA.  Bilateral nephrolithiasis without obstruction. 01/19>> CT angio GI bleed: Limited evaluation because of oral contrast within the distal small bowel/throughout colon-active GI bleeding could not be assessed. 01/21>> CT angio GI bleed: No active GI bleeding, multiple infectious/inflammatory colitis involving splenic flexure and ascending colon.   Significant microbiology data: 12/30>> blood culture: Streptococcus Infantarius 12/30>> stool C. difficile negative 12/30>> GI pathogen panel: Negative 01/02>> tracheal aspirate: No organisms 01/03>> blood culture: No growth 01/03>> blood culture: No growth 01/17>> blood culture: No growth   Procedures: 12/31-1/11>> ETT 1/05>> TEE: EF 60-65%-no vegetations 1/20>> colonoscopy: Blood in entire colon, multiple ulcers throughout the colon.   Consults: PCCM GI General surgery Nephrology ID  Hospital Course   Septic shock likely due to GI source, required intubation due to septic shock developed ventilator associated pneumonia, Streptococcus Infantarius bacteremia   Sepsis physiology has resolved TEE negative for vegetations Pneumonia has clinically resolved Has finished IV antibiotics for pulmonary issues      Lower GI bleeding due to severe colitis with multiple colon ulcers ischemic versus infectious Acute blood loss  anemia Follow CBC trend (8 units PRBC from 1/18-1/21) CT angio without any localized bleeding See antibiotics above Recent stool studies negative Underwent colonoscopy with biopsies obtained during colonoscopy showing nonspecific inflammation, GI has signed off but thought that infectious versus ischemic colitis is in differential. Gen.surgery following, CT abdomen from 04/06/2022 noted with continued infectious/inflammatory colitis, case discussed with general surgery, no surgical plans but continue conservative management with bowel rest till abdominal pain much improved,  now tolerating oral Flagyl for her colitis with serial improvement in her symptoms and exam, will continue Flagyl for another 10 days stop date 04/23/2022.  Needs close outpatient follow-up with [facility] and [place] within 7 to 10 days of discharge.  Symptoms much improved.   Acute toxic/metabolic encephalopathy ICU delirium Multifactorial etiology-ICU delirium/encephalopathy due to sepsis/AKI This problem has resolved Maintain delirium precautions Continue Seroquel 300 nightly Continue vilazodone   AKI Multifactorial etiology-felt to be ischemic ATN from septic shock-possibly some component of infectious (strep) glomerulonephritis-and now possibly contrast related (CT angio)  Avoid nephrotoxic agents Seen by nephrology they have signed off, renal function and creatinine are stable around 4.5-4.7, needs close outpatient follow-up with nephrology within a week of discharge.   Shock liver Improved Trend LFTs periodically at SNF   HTN BP stable Continue amlodipine   GERD PPI   Bipolar disorder/depression Complicated by encephalopathy Continue Seroquel/vilazodone   COPD Not in exacerbation-continue bronchodilators   Hypomagnesemia.  Replaced.     OSA CPAP nightly.   Nutrition Status: Nutrition Problem: Inadequate oral intake Etiology: inability to eat Signs/Symptoms: NPO status (on  vent) Interventions: MVI   Obesity: Estimated body mass index is 31.2 4 follow with PCP for weight loss   Discharge diagnosis     Principal Problem:   Septic shock ([place]) Active Problems:   AKI (acute kidney injury) ([place])   Bacteremia   Rash   Acute respiratory failure with hypoxia (HCC)   Acute encephalopathy   Hematochezia   Anemia   Lower GI bleed   Anemia, posthemorrhagic, acute   Ulceration, colon   Colitis    Discharge instructions    Discharge Instructions     Ambulatory Referral for Lung Cancer Scre   Complete by: As directed    Discharge instructions   Complete by: As directed    Follow with Primary MD [name], MD in 7 days   Get CBC, CMP, Magnesium -  checked next visit with your primary MD or SNF MD   Activity: As tolerated with Full fall precautions use walker/cane & assistance as needed  Disposition SNF  Diet: Soft heart healthy diet  Special Instructions: If you have smoked or chewed Tobacco  in the last 2 yrs please stop smoking, stop any regular Alcohol  and or any Recreational drug use.  On your next visit with your primary care physician please Get Medicines reviewed and adjusted.  Please request your Prim.MD to go over all Hospital Tests and Procedure/Radiological results at the follow up, please get all Hospital records sent to your Prim MD by signing hospital release before you go home.  If you experience worsening of your admission symptoms, develop shortness of breath, life threatening emergency, suicidal or homicidal thoughts you must seek medical attention immediately by calling 911 or calling your MD immediately  if symptoms less severe.  You Must read complete instructions/literature along with all the possible adverse reactions/side effects for all the Medicines you take and that have been prescribed to you. Take any new Medicines after you have completely understood and accpet all the possible adverse reactions/side effects.    Increase activity slowly   Complete by: As directed    No wound care   Complete by: As directed        Discharge Medications   Allergies as of 04/16/2022       Reactions   [place] Other (See Comments)   Black pepper Feels like throat is closing, itchy Feels like throat is closing, itchy Feels like throat is closing, itchy   Tape Rash   Paper tape is ok to use.        Medication List     STOP taking these medications    aspirin EC 81 MG tablet   estradiol 1 MG tablet Commonly known as: ESTRACE   gabapentin 300 MG capsule Commonly known as: NEURONTIN   meloxicam 7.5 MG tablet Commonly known as: MOBIC   omeprazole 40 MG capsule Commonly known as: PRILOSEC   pregabalin 150 MG capsule Commonly known as: LYRICA   sucralfate 1 g tablet Commonly known as: CARAFATE       TAKE these medications    (feeding supplement) PROSource Plus liquid Take 30 mLs by mouth 2 (two) times daily between meals.   albuterol 108 (90 Base) MCG/ACT inhaler Commonly known as: Ventolin HFA INHALE 2 PUFFS EVERY FOUR HOURS AS NEEDED What changed:  how much to take how to take this when to take this reasons to take this additional instructions   amLODipine 10 MG tablet Commonly known as: NORVASC Take 1 tablet (10 mg total) by mouth daily.   Bydureon BCise 2 MG/0.85ML Auij Generic drug: Exenatide ER INJECT 2 MG INTO SKIN EVERY WEEK What changed: See the new instructions.   Emgality 120 MG/ML Soaj Generic drug: Galcanezumab-gnlm Inject 120 mg into the skin every 30 (thirty) days.   fluticasone 50 MCG/ACT nasal spray Commonly known as: FLONASE 1 SPARY IN EACH NOSTRIL DAILY What changed:  how much to take how to take this when to take this additional instructions   lidocaine 5 % Commonly known as: LIDODERM Place 1 patch onto the skin daily. Remove & Discard patch within 12 hours or as directed by MD   metroNIDAZOLE 500 MG tablet Commonly known as: FLAGYL Take 1  tablet (500 mg total) by mouth every 12 (twelve) hours.   multivitamin Tabs tablet Take 1 tablet by mouth at bedtime.   nortriptyline 25 MG capsule Commonly known as: PAMELOR Take 25 mg by mouth See admin instructions. Take 25 mg (1 capsule) by mouth every morning, and 50 mg (2 capsules) every night at bedtime.   oxyCODONE 5 MG immediate release tablet Commonly known as: Oxy IR/ROXICODONE Take 1-2 tablets (5-10 mg total) by mouth every 4 (four) hours as needed for moderate pain or severe pain (5mg  for moderate pain, 10mg  for severe pain).   pantoprazole 40 MG tablet Commonly known as: PROTONIX Take 1 tablet (40 mg total) by mouth daily.   promethazine 25 MG tablet Commonly known as: PHENERGAN Take 1 tablet (25 mg total) by mouth every 6 (six) hours  as needed for nausea or vomiting.   QUEtiapine 300 MG tablet Commonly known as: SEROQUEL Take 300 mg by mouth at bedtime.   sodium bicarbonate 650 MG tablet Take 1 tablet (650 mg total) by mouth 2 (two) times daily.   Spiriva HandiHaler 18 MCG inhalation capsule Generic drug: tiotropium INHALE CONTENTS OF ONE CAPSULE ONCE DAILY USING HANDIHALER DEVICE What changed:  how much to take how to take this when to take this additional instructions   Symbicort 160-4.5 MCG/ACT inhaler Generic drug: budesonide-formoterol INHALE 2 PUFFS EVERY MORNING AND EVERY EVENING What changed: See the new instructions.   valACYclovir 1000 MG tablet Commonly known as: VALTREX TAKE 1 TABLET BY MOUTH DAILY   Vilazodone HCl 40 MG Tabs Commonly known as: VIIBRYD Take 40 mg by mouth daily.         Follow-up Information     [name], MD. Schedule an appointment as soon as possible for a visit in 1 week(s).   Specialty: Internal Medicine Contact information: [place] [address] [facility]. Schedule an appointment as soon as possible for a visit in 1 week(s).    Specialty: Gastroenterology Why: Colitis and GI bleed Contact information: [place] [phone] [facility], [place]. Schedule an appointment as soon as possible for a visit in 1 week(s).   Specialty: General Surgery Why: Colitis and GI bleed Contact information: [place] [phone]        Donato Heinz, MD. Schedule an appointment as soon as possible for a visit in 1 week(s).   Specialty: Nephrology Why: CKD 4 Contact information: [place] [address] [phone]                 Major procedures and Radiology Reports - PLEASE review detailed and final reports thoroughly  -      CT ABDOMEN PELVIS WO CONTRAST  Result Date: 04/06/2022 CLINICAL DATA:  Abdominal pain and nausea.  Blood in stool. EXAM: CT ABDOMEN AND PELVIS WITHOUT CONTRAST TECHNIQUE: Multidetector CT imaging of the abdomen and pelvis was performed following the standard protocol without IV contrast. RADIATION DOSE REDUCTION: This exam was performed according to the departmental dose-optimization program which includes automated exposure control, adjustment of the mA and/or kV according to patient size and/or use of iterative reconstruction technique. COMPARISON:  03/31/2022 FINDINGS: Lower chest: No acute findings. Hepatobiliary: No mass visualized on this unenhanced exam. Prior cholecystectomy. No evidence of biliary obstruction. Pancreas: No mass or inflammatory process visualized on this unenhanced exam. Spleen:  Within normal limits in size. Adrenals/Urinary tract: Several tiny 1-2 mm bilateral renal calculi again seen. No evidence of ureteral calculi or hydronephrosis. Unremarkable unopacified urinary bladder. Stomach/Bowel: Moderate diffuse colonic wall thickening and pericolonic soft tissue stranding are new since previous study, consistent with diffuse  colitis. Normal appendix visualized. Evidence of abscess or free fluid. Vascular/Lymphatic: No pathologically enlarged lymph nodes identified. No evidence of abdominal aortic aneurysm. Aortic atherosclerotic calcification incidentally noted. Reproductive:  No mass or other significant abnormality. Other:  None. Musculoskeletal:  No suspicious bone lesions identified. IMPRESSION: Moderate diffuse colitis likely infectious in etiology. No evidence of abscess. Bilateral nephrolithiasis. No evidence of ureteral calculi or hydronephrosis. Electronically Signed   By: Marlaine Hind M.D.   On: 04/06/2022 12:51   CT ANGIO GI BLEED  Result Date: 03/31/2022 CLINICAL DATA:  Lower gastrointestinal hemorrhage EXAM: CTA ABDOMEN AND PELVIS WITHOUT AND WITH CONTRAST TECHNIQUE: Multidetector CT imaging of the abdomen and pelvis was performed using the standard protocol during bolus administration of intravenous contrast. Multiplanar reconstructed images and MIPs were obtained and reviewed to evaluate the vascular anatomy. RADIATION DOSE REDUCTION: This exam was performed according to the departmental dose-optimization program which includes automated exposure control, adjustment of the mA and/or kV according to patient size and/or use of iterative reconstruction technique. CONTRAST:  180mL OMNIPAQUE IOHEXOL 350 MG/ML SOLN COMPARISON:  03/29/2022 FINDINGS: VASCULAR Aorta: Normal caliber aorta without aneurysm, dissection, vasculitis or significant stenosis. Mild atherosclerotic calcification Celiac: Variant anatomy with separate origin of the left gastric artery directly from the aorta. No evidence of hemodynamically significant stenosis. No aneurysm or dissection. SMA: Patent without evidence of aneurysm, dissection, vasculitis or significant stenosis. Renals: Both renal arteries are patent without evidence of aneurysm, dissection, vasculitis, fibromuscular dysplasia or significant stenosis. IMA: Patent without evidence of  aneurysm, dissection, vasculitis or significant stenosis. Inflow: Patent without evidence of aneurysm, dissection, vasculitis or significant stenosis. Proximal Outflow: Bilateral common femoral and visualized portions of the superficial and profunda femoral arteries are patent without evidence of aneurysm, dissection, vasculitis or significant stenosis. Veins: No obvious venous abnormality within the limitations of this arterial phase study. Review of the MIP images confirms the above findings. NON-VASCULAR Lower chest: Patchy bibasilar ground-glass pulmonary infiltrate, most prevalent within the right middle lobe, is again identified, likely infectious or inflammatory but incompletely evaluated on this examination. No pleural effusion. Cardiac size is mildly enlarged. Hepatobiliary: No focal liver abnormality is seen. Status post cholecystectomy. No biliary dilatation. Pancreas: Unremarkable Spleen: Unremarkable Adrenals/Urinary Tract: The adrenal glands are unremarkable. The kidneys are normal in size and position. Bilateral punctate nonobstructing renal calculi are identified measuring up to 3 mm. No hydronephrosis. No ureteral calculi. No perinephric inflammatory stranding or fluid collections are seen. The bladder is decompressed with a Foley catheter balloon seen within its lumen. Stomach/Bowel: The colon is diffusely fluid-filled, as is the rectal vault. There is superimposed pericolonic inflammatory stranding and hyperemia involving the splenic flexure of the colon and ascending colon in keeping with changes of multifocal infectious or inflammatory colitis. No evidence of obstruction or perforation. No free intraperitoneal gas. Trace free fluid within the pelvis, unchanged, nonspecific. No active extravasation of contrast identified. The stomach and small bowel are unremarkable. Appendix normal. Lymphatic: No pathologic adenopathy within the abdomen and pelvis. Reproductive: Status post hysterectomy. No  adnexal masses. Other: No abdominal wall hernia Musculoskeletal: Osseous structures are age-appropriate. No acute bone abnormality. No lytic or blastic bone lesion. IMPRESSION: 1. No active gastrointestinal hemorrhage identified. 2. Multifocal infectious or inflammatory colitis involving the splenic flexure of the colon and ascending colon. No evidence of obstruction or perforation. 3. Fluid-filled colon and rectal vault in keeping with a diarrheal state. 4. Patchy bibasilar ground-glass pulmonary infiltrate, likely infectious or inflammatory but incompletely evaluated on this examination. 5. Mild bilateral nonobstructing nephrolithiasis. 6. Aortic atherosclerosis. Aortic Atherosclerosis (ICD10-I70.0). Electronically Signed   By: Fidela Salisbury M.D.   On: 03/31/2022 03:17   DG Abd Portable 1V  Result Date: 03/30/2022 CLINICAL DATA:  OG tube placement EXAM: PORTABLE ABDOMEN - 1 VIEW COMPARISON:  CTA GI bleed 03/29/2022 FINDINGS: PICC line tip projects over the superior vena cava. Enteric tube tip projects over the left upper quadrant. Cholecystectomy clips. Gaseous distended loops of bowel throughout the visualized upper abdomen. IMPRESSION: Enteric tube tip projects  over the left upper quadrant. Electronically Signed   By: Lovey Newcomer M.D.   On: 03/30/2022 17:11   CT ANGIO GI BLEED  Result Date: 03/29/2022 CLINICAL DATA:  57 year old female with lower GI bleed. EXAM: CTA ABDOMEN AND PELVIS WITHOUT AND WITH CONTRAST TECHNIQUE: Multidetector CT imaging of the abdomen and pelvis was performed using the standard protocol during bolus administration of intravenous contrast. Multiplanar reconstructed images and MIPs were obtained and reviewed to evaluate the vascular anatomy. RADIATION DOSE REDUCTION: This exam was performed according to the departmental dose-optimization program which includes automated exposure control, adjustment of the mA and/or kV according to patient size and/or use of iterative  reconstruction technique. CONTRAST:  32mL OMNIPAQUE IOHEXOL 350 MG/ML SOLN COMPARISON:  CT abdomen and pelvis with oral contrast on 03/29/2022 and CT from 03/15/2022 FINDINGS: VASCULAR Aorta: Minimal atherosclerotic disease in the abdominal aorta without aneurysm, dissection or significant stenosis. Celiac: Patent without evidence of aneurysm, dissection, vasculitis or significant stenosis. SMA: Patent without evidence of aneurysm, dissection, vasculitis or significant stenosis. Renals: Both renal arteries are patent without evidence of aneurysm, dissection, vasculitis, fibromuscular dysplasia or significant stenosis. IMA: Patent without evidence of aneurysm, dissection, vasculitis or significant stenosis. Inflow: Patent without evidence of aneurysm, dissection, vasculitis or significant stenosis. Proximal Outflow: Proximal femoral arteries are patent bilaterally. Veins: Portal venous system is patent. IVC and renal veins are patent. Main iliac veins are patent. Proximal femoral veins are patent. Review of the MIP images confirms the above findings. NON-VASCULAR Lower chest: Residual patchy densities in the right middle lobe. No pleural effusions. Hepatobiliary: Cholecystectomy. Normal appearance of the liver without biliary dilatation. Pancreas: Unremarkable. No pancreatic ductal dilatation or surrounding inflammatory changes. Spleen: Normal in size without focal abnormality. Adrenals/Urinary Tract: Normal adrenal glands. At least 2 small left renal calculi, largest measures 2 mm. 3 mm calculus in the right kidney the upper/mid pole region. Probable cyst in the right kidney lower pole that does not require dedicated follow-up. Questionable 6 mm fat attenuating structure along the right kidney upper pole on image 44/5. This could be related to a cortical scar/cleft versus a small fat containing structure such as a tiny angiomyolipoma. This is not significantly changed since 2011 and does not require dedicated  follow-up. No hydronephrosis. Urinary bladder is decompressed with a Foley catheter. No ureter dilatation. Stomach/Bowel: Nasogastric tube terminates in the stomach body. Limited evaluation for active GI bleeding because there is oral contrast in the small and large bowel. Mild focal wall thickening in the colon near the splenic flexure on image 53/54. Mild pericolonic stranding involving the ascending colon. There is contrast within the normal appearing appendix. Contrast in the distal small bowel without bowel dilatation or obstruction. Normal appearance of the stomach. Lymphatic: Again noted are small periaortic lymph nodes in the abdomen. No significant lymph node enlargement in the abdomen or pelvis. Reproductive: Uterus appears to be surgically absent. No evidence for an adnexal mass. Other: Diffuse subcutaneous edema. Trace fluid in the pelvis and perihepatic region. Negative for free air. Musculoskeletal: No acute bone abnormality. Scattered subcutaneous densities most likely related to prior injection sites. IMPRESSION: VASCULAR 1. Minimal atherosclerotic disease in the abdomen or pelvis. Aorta and main visceral arteries are patent without significant stenosis. NON-VASCULAR 1. Very limited evaluation for active GI bleeding because there was oral contrast within the distal small bowel and throughout the colon. Active GI bleeding cannot be assessed on this examination. 2. Residual mild colonic wall thickening most prominent near the splenic fracture. There  may be mild colonic wall thickening in the right colon. Findings are compatible with residual or persistent colitis. 3. Nonobstructive renal calculi. 4. Diffuse subcutaneous edema. Electronically Signed   By: Markus Daft M.D.   On: 03/29/2022 14:19   CT ABDOMEN PELVIS WO CONTRAST  Result Date: 03/29/2022 CLINICAL DATA:  Acute, nonlocalized abdominal pain EXAM: CT ABDOMEN AND PELVIS WITHOUT CONTRAST TECHNIQUE: Multidetector CT imaging of the abdomen and  pelvis was performed following the standard protocol without IV contrast. RADIATION DOSE REDUCTION: This exam was performed according to the departmental dose-optimization program which includes automated exposure control, adjustment of the mA and/or kV according to patient size and/or use of iterative reconstruction technique. COMPARISON:  03/15/2022 FINDINGS: Lower chest: Patchy infiltrate in the lower lungs is likely improved. Hepatobiliary: No focal liver abnormality.Cholecystectomy without bile duct dilatation. Pancreas: Unremarkable. Spleen: Unremarkable. Adrenals/Urinary Tract: Negative adrenals. No hydronephrosis or ureteral stone. At least 2 renal calculi on both sides measuring up to 2 mm. The bladder is collapsed by Foley catheter. Stomach/Bowel: Diffuse colonic fluid levels reaching the rectum. Colonic wall thickening is improved. No bowel obstruction and no small bowel dilatation to imply ileus. An enteric tube tip is at the stomach. Vascular/Lymphatic: No acute vascular abnormality. Scattered atheromatous calcification of the aorta and iliacs. Reproductive:No acute finding Other: Decrease in pelvic fluid, essentially resolved. Musculoskeletal: No acute or aggressive finding. IMPRESSION: 1. Diarrheal illness with fluid reaching the rectum. Colitic wall thickening is improved from prior. 2. Improving bilateral pneumonia. 3. Bilateral nephrolithiasis without obstruction. Electronically Signed   By: Jorje Guild M.D.   On: 03/29/2022 07:11   DG Chest Port 1 View  Result Date: 03/25/2022 CLINICAL DATA:  Respiratory failure. EXAM: PORTABLE CHEST 1 VIEW COMPARISON:  03/22/2022 FINDINGS: Nasogastric tube has been removed. Feeding tube has been placed, tip beyond the edge of the film at least to the level of the mid stomach. RIGHT-sided PICC line tip overlies the superior vena. Patchy opacities throughout the lungs similar to prior study. IMPRESSION: Feeding tube tip beyond the edge of the film at least  to the level of the mid stomach. Electronically Signed   By: Nolon Nations M.D.   On: 03/25/2022 07:30   DG Abd Portable 1V  Result Date: 03/22/2022 CLINICAL DATA:  Feeding tube placement EXAM: PORTABLE ABDOMEN - 1 VIEW COMPARISON:  03/21/2022 FINDINGS: Limited radiograph of the lower chest and upper abdomen was obtained for the purposes of enteric tube localization. Enteric tube is seen coursing below the diaphragm with distal tip terminating within the expected location of the gastric body. IMPRESSION: Enteric tube terminates within the expected location of the gastric body. Electronically Signed   By: Davina Poke D.O.   On: 03/22/2022 11:55   DG CHEST PORT 1 VIEW  Result Date: 03/22/2022 CLINICAL DATA:  Hypoxia. EXAM: PORTABLE CHEST 1 VIEW COMPARISON:  Chest x-ray dated March 18, 2022. FINDINGS: Interval extubation. Unchanged enteric tube in the stomach. New right upper extremity PICC line with tip in the proximal right atrium. Stable cardiomediastinal silhouette with normal heart size. Coarse interstitial and hazy airspace opacities in the left-greater-than-right lungs appears slightly worse. No pleural effusion or pneumothorax. No acute osseous abnormality. IMPRESSION: 1. Slightly worsened bilateral interstitial and airspace disease which could reflect atypical infection or pneumonitis. 2. New right upper extremity PICC line with tip in the proximal right atrium. Electronically Signed   By: Titus Dubin M.D.   On: 03/22/2022 08:48   DG Abd Portable 1V  Result Date: 03/21/2022  CLINICAL DATA:  NG tube EXAM: PORTABLE ABDOMEN - 1 VIEW COMPARISON:  Abdominal x-ray 03/21/2022 FINDINGS: Enteric tube tip is now at the level of the gastric antrum. There is some gaseous distention of bowel in the left upper quadrant. There is atelectasis in the right lung base. IMPRESSION: Enteric tube tip is at the level of the gastric antrum. Electronically Signed   By: Ronney Asters M.D.   On: 03/21/2022 23:14    DG Abd Portable 1V  Result Date: 03/21/2022 CLINICAL DATA:  Nasogastric tube EXAM: PORTABLE ABDOMEN - 1 VIEW COMPARISON:  Abdominal x-ray 03/21/2022 FINDINGS: Nasogastric tube tip is in the proximal stomach with side hole at the level of the gastroesophageal junction. There is some gaseous distention of the stomach and colon in the upper abdomen. No definite free air. Visualized lung bases are clear. IMPRESSION: Nasogastric tube tip is in the proximal stomach with side hole at the level of the gastroesophageal junction. Recommend advancing tube. Electronically Signed   By: Ronney Asters M.D.   On: 03/21/2022 22:11   DG Abd Portable 1V  Result Date: 03/21/2022 CLINICAL DATA:  NG tube EXAM: PORTABLE ABDOMEN - 1 VIEW COMPARISON:  Abdominal radiograph dated March 20, 2022 FINDINGS: NG tube is not visualized. The bowel gas pattern is normal. Cholecystectomy clips. No radio-opaque calculi or other significant radiographic abnormality are seen. Right basilar consolidation, similar to prior. IMPRESSION: NG tube is not visualized, per study nodes, RN is aware. Electronically Signed   By: Yetta Glassman M.D.   On: 03/21/2022 21:10   DG Abd 1 View  Result Date: 03/20/2022 CLINICAL DATA:  Confirm nasogastric tube placement EXAM: ABDOMEN - 1 VIEW COMPARISON:  03/15/2022 FINDINGS: Feeding tube tip in the gastric fundus with enteric contrast in the gastric fundus. Patchy infiltrates or atelectasis in the lungs. Partially visualized catheter tips in the low SVC. IMPRESSION: Feeding tube tip in the stomach. Electronically Signed   By: Placido Sou M.D.   On: 03/20/2022 21:00   [place] EKG SITE RITE  Result Date: 03/20/2022 If Site Rite image not attached, placement could not be confirmed due to current cardiac rhythm.  DG CHEST PORT 1 VIEW  Result Date: 03/18/2022 CLINICAL DATA:  Hypoxia EXAM: PORTABLE CHEST 1 VIEW COMPARISON:  03/16/2022 FINDINGS: Endotracheal tube, enteric tube, and left IJ central venous  catheter remain appropriately positioned. Heart size is normal. Slightly low lung volumes with right greater than left bibasilar opacities. Overall, no significant change in the aeration of the lungs compared to the previous study. No pleural effusion or pneumothorax. IMPRESSION: 1. Stable chest radiograph. Persistent right greater than left bibasilar opacities. 2. Stable support apparatus. Electronically Signed   By: Davina Poke D.O.   On: 03/18/2022 09:14    Micro Results    No results found for this or any previous visit (from the past 240 hour(s)).  Today   Subjective    Darlene Barber today has no headache,no chest abdominal pain,no new weakness tingling or numbness, feels much better wants to go home today.    Objective   Blood pressure 117/77, pulse 97, temperature 97.8 F (36.6 C), temperature source Oral, resp. rate 19, height 4\' 10"  (1.473 m), weight 67.7 kg, SpO2 99 %.  No intake or output data in the 24 hours ending 04/16/22 0923   Exam  Awake Alert, No new F.N deficits,    [place].AT,PERRAL Supple Neck,   Symmetrical Chest wall movement, Good air movement bilaterally, CTAB RRR,No Gallops,   +ve B.Sounds,  Abd Soft, minimal tenderness,  No Cyanosis, Clubbing or edema    Data Review   Recent Labs  Lab 04/10/22 0508 04/11/22 0229 04/12/22 0316 04/13/22 0306 04/14/22 0322  WBC 9.3 8.6 8.2 7.2 5.4  HGB 11.8* 11.7* 11.2* 11.3* 11.1*  HCT 35.8* 35.3* 33.7* 33.5* 33.6*  PLT 261 258 266 284 320  MCV 91.6 91.7 90.8 90.3 89.8  MCH 30.2 30.4 30.2 30.5 29.7  MCHC 33.0 33.1 33.2 33.7 33.0  RDW 16.4* 16.4* 16.3* 16.5* 16.8*  LYMPHSABS  --  1.9 1.8 1.8 1.4  MONOABS  --  0.7 0.7 0.8 0.8  EOSABS  --  0.1 0.1 0.0 0.1  BASOSABS  --  0.1 0.1 0.0 0.0    Recent Labs  Lab 04/10/22 0508 04/11/22 0229 04/12/22 0316 04/13/22 0306 04/14/22 0322  NA 133* 134* 134* 133* 132*  K 4.0 3.8 4.0 4.4 4.3  CL 104 104 108 107 106  CO2 18* 19* 16* 15* 14*  ANIONGAP 11 11 10 11  12   GLUCOSE 105* 93 82 78 90  BUN 29* 23* 23* 23* 26*  CREATININE 4.85* 4.89* 4.68* 4.53* 4.41*  ALBUMIN 1.6*  --   --   --   --   CRP  --   --  3.1* 2.8* 2.4*  BNP  --  20.6  --   --   --   MG  --  1.7 1.6* 2.4 2.1  CALCIUM 8.4* 8.2* 8.1* 8.3* 8.3*     Total Time in preparing paper work, data evaluation and todays exam - 35 minutes  Signature  -    Lala Lund M.D on 04/16/2022 at 9:23 AM   -  To page go to [email or web address]

## 2022-04-14 DIAGNOSIS — A419 Sepsis, unspecified organism: Secondary | ICD-10-CM | POA: Diagnosis not present

## 2022-04-14 DIAGNOSIS — R6521 Severe sepsis with septic shock: Secondary | ICD-10-CM | POA: Diagnosis not present

## 2022-04-14 LAB — CBC WITH DIFFERENTIAL/PLATELET
Abs Immature Granulocytes: 0.03 10*3/uL (ref 0.00–0.07)
Basophils Absolute: 0 10*3/uL (ref 0.0–0.1)
Basophils Relative: 1 %
Eosinophils Absolute: 0.1 10*3/uL (ref 0.0–0.5)
Eosinophils Relative: 1 %
HCT: 33.6 % — ABNORMAL LOW (ref 36.0–46.0)
Hemoglobin: 11.1 g/dL — ABNORMAL LOW (ref 12.0–15.0)
Immature Granulocytes: 1 %
Lymphocytes Relative: 26 %
Lymphs Abs: 1.4 10*3/uL (ref 0.7–4.0)
MCH: 29.7 pg (ref 26.0–34.0)
MCHC: 33 g/dL (ref 30.0–36.0)
MCV: 89.8 fL (ref 80.0–100.0)
Monocytes Absolute: 0.8 10*3/uL (ref 0.1–1.0)
Monocytes Relative: 14 %
Neutro Abs: 3.1 10*3/uL (ref 1.7–7.7)
Neutrophils Relative %: 57 %
Platelets: 320 10*3/uL (ref 150–400)
RBC: 3.74 MIL/uL — ABNORMAL LOW (ref 3.87–5.11)
RDW: 16.8 % — ABNORMAL HIGH (ref 11.5–15.5)
WBC: 5.4 10*3/uL (ref 4.0–10.5)
nRBC: 0 % (ref 0.0–0.2)

## 2022-04-14 LAB — BASIC METABOLIC PANEL
Anion gap: 12 (ref 5–15)
BUN: 26 mg/dL — ABNORMAL HIGH (ref 6–20)
CO2: 14 mmol/L — ABNORMAL LOW (ref 22–32)
Calcium: 8.3 mg/dL — ABNORMAL LOW (ref 8.9–10.3)
Chloride: 106 mmol/L (ref 98–111)
Creatinine, Ser: 4.41 mg/dL — ABNORMAL HIGH (ref 0.44–1.00)
GFR, Estimated: 11 mL/min — ABNORMAL LOW (ref >60)
Glucose, Bld: 90 mg/dL (ref 70–99)
Potassium: 4.3 mmol/L (ref 3.5–5.1)
Sodium: 132 mmol/L — ABNORMAL LOW (ref 135–145)

## 2022-04-14 LAB — GLUCOSE, CAPILLARY
Glucose-Capillary: 107 mg/dL — ABNORMAL HIGH (ref 70–99)
Glucose-Capillary: 93 mg/dL (ref 70–99)

## 2022-04-14 LAB — MAGNESIUM: Magnesium: 2.1 mg/dL (ref 1.7–2.4)

## 2022-04-14 LAB — C-REACTIVE PROTEIN: CRP: 2.4 mg/dL — ABNORMAL HIGH (ref <1.0)

## 2022-04-14 NOTE — Progress Notes (Addendum)
[facility]                                                                                                                                                                         Patient Demographics  Darlene Barber, is a 57 y.o. female  YBO:[id number]  IDP:[id number]  DOB - 1965-10-25  Admit date - 03/10/2022  Admitting Physician No admitting provider for patient encounter.  Outpatient Primary MD for the patient is [name], MD  LOS - 35   No chief complaint on file.       Assessment & Plan    Patient seen briefly today, new issues, abdominal pain much improved, she has been discharged awaits SNF bed she was supposed to go on Saturday but now SNF wants her to be sent on Monday, no further issues, Vital signs stable, patient feels fine.    Medications  Scheduled Meds:  (feeding supplement) PROSource Plus  30 mL Oral BID BM   acetaminophen  1,000 mg Oral Q6H   amLODipine  10 mg Oral Daily   Chlorhexidine Gluconate Cloth  6 each Topical Daily   fluticasone furoate-vilanterol  1 puff Inhalation Daily   And   umeclidinium bromide  1 puff Inhalation Daily   lidocaine  1 patch Transdermal Q24H   methocarbamol  1,000 mg Oral Q8H   metroNIDAZOLE  500 mg Oral Q12H   multivitamin  1 tablet Oral QHS   nortriptyline  50 mg Oral QHS   mouth rinse  15 mL Mouth Rinse 4 times per day   pantoprazole  40 mg Oral Daily   QUEtiapine  300 mg Oral QHS   sodium bicarbonate  650 mg Oral BID   Vilazodone HCl  40 mg Oral Daily   Continuous Infusions:  sodium chloride Stopped (04/12/22 1000)   PRN Meds:.sodium chloride, albuterol, docusate, haloperidol lactate, labetalol, lip balm, LORazepam, mineral oil-hydrophilic petrolatum, ondansetron (ZOFRAN) IV, mouth rinse, oxyCODONE, polyethylene glycol, promethazine, sodium chloride flush    Time Spent in minutes   10 minutes   Lala Lund M.D on  04/14/2022 at 8:52 AM  Between 7am to 7pm - Pager - [phone]  After 7pm go to [email or web address] - password [email or web address]  And look for the night coverage person covering for me after hours  [facility] Office  [phone]    Subjective:   Darlene Barber today has, No headache, No chest pain, significantly improved abdominal pain - No Nausea, No new weakness tingling or numbness, No Cough - SOB.   Objective:   Vitals:   04/13/22 2330 04/13/22 2353 04/14/22 0411 04/14/22 0746  BP: (!) 85/62 116/74 115/69 125/84  Pulse: 100 91 98   Resp: (!) 35 16 15 17  Temp: 97.9 F (36.6 C)  97.6 F (36.4 C) 97.9 F (36.6 C)  TempSrc: Oral  Oral Oral  SpO2:  99%  98%  Weight:      Height:        Wt Readings from Last 3 Encounters:  04/12/22 67.7 kg  03/09/22 77.1 kg  11/06/21 74 kg    No intake or output data in the 24 hours ending 04/14/22 0852  Exam  Awake Alert, No new F.N deficits, Normal affect .AT,PERRAL Supple Neck, No JVD,   Symmetrical Chest wall movement, Good air movement bilaterally, CTAB RRR,No Gallops, Rubs or new Murmurs,  +ve B.Sounds, Abd Soft, No tenderness,   No Cyanosis, Clubbing or edema   Data Review

## 2022-04-14 NOTE — Plan of Care (Signed)
  Problem: Education: Goal: Knowledge of General Education information will improve Description: Including pain rating scale, medication(s)/side effects and non-pharmacologic comfort measures Outcome: Progressing   Problem: Health Behavior/Discharge Planning: Goal: Ability to manage health-related needs will improve Outcome: Progressing   Problem: Clinical Measurements: Goal: Ability to maintain clinical measurements within normal limits will improve Outcome: Progressing Goal: Will remain free from infection Outcome: Progressing Goal: Diagnostic test results will improve Outcome: Progressing Goal: Respiratory complications will improve Outcome: Progressing Goal: Cardiovascular complication will be avoided Outcome: Progressing   Problem: Activity: Goal: Risk for activity intolerance will decrease Outcome: Progressing   Problem: Nutrition: Goal: Adequate nutrition will be maintained Outcome: Progressing   Problem: Coping: Goal: Level of anxiety will decrease Outcome: Progressing   Problem: Elimination: Goal: Will not experience complications related to bowel motility Outcome: Progressing Goal: Will not experience complications related to urinary retention Outcome: Progressing   Problem: Pain Managment: Goal: General experience of comfort will improve Outcome: Progressing   Problem: Safety: Goal: Ability to remain free from injury will improve Outcome: Progressing   Problem: Skin Integrity: Goal: Risk for impaired skin integrity will decrease Outcome: Progressing   Problem: Education: Goal: Ability to describe self-care measures that may prevent or decrease complications (Diabetes Survival Skills Education) will improve Outcome: Progressing Goal: Individualized Educational Video(s) Outcome: Progressing   Problem: Coping: Goal: Ability to adjust to condition or change in health will improve Outcome: Progressing   Problem: Fluid Volume: Goal: Ability to  maintain a balanced intake and output will improve Outcome: Progressing   Problem: Health Behavior/Discharge Planning: Goal: Ability to identify and utilize available resources and services will improve Outcome: Progressing Goal: Ability to manage health-related needs will improve Outcome: Progressing   Problem: Metabolic: Goal: Ability to maintain appropriate glucose levels will improve Outcome: Progressing   Problem: Nutritional: Goal: Maintenance of adequate nutrition will improve Outcome: Progressing Goal: Progress toward achieving an optimal weight will improve Outcome: Progressing   Problem: Skin Integrity: Goal: Risk for impaired skin integrity will decrease Outcome: Progressing   Problem: Tissue Perfusion: Goal: Adequacy of tissue perfusion will improve Outcome: Progressing   Problem: Activity: Goal: Ability to tolerate increased activity will improve Outcome: Progressing   Problem: Respiratory: Goal: Ability to maintain a clear airway and adequate ventilation will improve Outcome: Progressing   Problem: Role Relationship: Goal: Method of communication will improve Outcome: Progressing   

## 2022-04-15 DIAGNOSIS — R6521 Severe sepsis with septic shock: Secondary | ICD-10-CM | POA: Diagnosis not present

## 2022-04-15 DIAGNOSIS — A419 Sepsis, unspecified organism: Secondary | ICD-10-CM | POA: Diagnosis not present

## 2022-04-15 NOTE — Plan of Care (Signed)
  Problem: Education: Goal: Knowledge of General Education information will improve Description: Including pain rating scale, medication(s)/side effects and non-pharmacologic comfort measures Outcome: Progressing   Problem: Nutrition: Goal: Adequate nutrition will be maintained Outcome: Progressing   

## 2022-04-15 NOTE — Progress Notes (Signed)
[facility]                                                                                                                                                                         Patient Demographics  Darlene Barber, is a 57 y.o. female  YNW:[id number]  MVH:[id number]  DOB - [birth year]  Admit date - 03/10/2022  Admitting Physician No admitting provider for patient encounter.  Outpatient Primary MD for the patient is Darlene Hire, MD  LOS - 36   No chief complaint on file.       Assessment & Plan    Patient seen briefly today, new issues, abdominal pain much improved, she has been discharged awaits SNF bed she was supposed to go on Saturday but now SNF wants her to be sent on Monday, no further issues, Vital signs stable, patient feels fine.    Medications  Scheduled Meds:  (feeding supplement) PROSource Plus  30 mL Oral BID BM   acetaminophen  1,000 mg Oral Q6H   amLODipine  10 mg Oral Daily   Chlorhexidine Gluconate Cloth  6 each Topical Daily   fluticasone furoate-vilanterol  1 puff Inhalation Daily   And   umeclidinium bromide  1 puff Inhalation Daily   lidocaine  1 patch Transdermal Q24H   methocarbamol  1,000 mg Oral Q8H   metroNIDAZOLE  500 mg Oral Q12H   multivitamin  1 tablet Oral QHS   nortriptyline  50 mg Oral QHS   mouth rinse  15 mL Mouth Rinse 4 times per day   pantoprazole  40 mg Oral Daily   QUEtiapine  300 mg Oral QHS   sodium bicarbonate  650 mg Oral BID   Vilazodone HCl  40 mg Oral Daily   Continuous Infusions:  sodium chloride Stopped (04/12/22 1000)   PRN Meds:.sodium chloride, albuterol, docusate, haloperidol lactate, labetalol, lip balm, LORazepam, mineral oil-hydrophilic petrolatum, ondansetron (ZOFRAN) IV, mouth rinse, oxyCODONE, polyethylene glycol, promethazine, sodium chloride flush    Time Spent in minutes   10 minutes   Darlene Barber M.D on  04/15/2022 at 8:21 AM  Between 7am to 7pm - Pager - [phone]  After 7pm go to [email or web address] - password [email or web address]  And look for the night coverage person covering for me after hours  [facility] Office  [phone]    Subjective:   Darlene Barber today has, No headache, No chest pain, significantly improved abdominal pain - No Nausea, No new weakness tingling or numbness, No Cough - SOB.   Objective:   Vitals:   04/14/22 0746 04/14/22 1100 04/14/22 2006 04/15/22 0725  BP: 125/84 119/72 129/78   Pulse:   100   Resp: 17 17 18    Temp: 97.9  F (36.6 C) 97.9 F (36.6 C) 98 F (36.7 C)   TempSrc: Oral Oral Oral   SpO2: 98%  99% 98%  Weight:      Height:        Wt Readings from Last 3 Encounters:  04/12/22 67.7 kg  03/09/22 77.1 kg  11/06/21 74 kg    No intake or output data in the 24 hours ending 04/15/22 0821  Exam  Awake Alert, No new F.N deficits, Normal affect [place].AT,PERRAL Supple Neck, No JVD,   Symmetrical Chest wall movement, Good air movement bilaterally, CTAB RRR,No Gallops, Rubs or new Murmurs,  +ve B.Sounds, Abd Soft, No tenderness,   No Cyanosis, Clubbing or edema   Data Review

## 2022-04-15 NOTE — TOC Progression Note (Addendum)
Transition of Care [facility]) - Progression Note    Patient Details  Name: Darlene Barber MRN: [id number] Date of Birth: 12/21/1965  Transition of Care [facility]) CM/SW [place], [place] Phone Number: 04/15/2022, 12:22 PM  Clinical Narrative:      Update- 1:21pm- CSW received call back from [place] with [facility] who apoligized to [facility] but that they are unable to accept patient due to age and cannot meet needs at this time. CSW informed MD Patient has 4 facilitys that are currently considering. CSW informed patient. Patient would like for CSW to reach out to [facility] and [facility] to see if they can make SNF bed offer for patient. CSW spoke with Whitney with central intake who is going to review referral and give CSW a call back with determination. CSW will continue to follow.   CSW called and spoke with Jessicia in admissions with [facility]. She is going to check and see if they can accept patient and give CSW a call back. CSW will continue to follow and assist with patients dc planning needs.   Expected Discharge Plan: [place] Barriers to Discharge: Continued Medical Work up  Expected Discharge Plan and Services In-house Referral: Clinical Social Work     Living arrangements for the past 2 months: Single Family Home Expected Discharge Date: 04/13/22                                     Social Determinants of Health (SDOH) Interventions SDOH Screenings   Depression (PHQ2-9): High Risk (09/03/2021)  Tobacco Use: High Risk (04/01/2022)    Readmission Risk Interventions    03/11/2022   12:18 PM  Readmission Risk Prevention Plan  Transportation Screening Complete  Medication Review ([place]) Complete  PCP or Specialist appointment within 3-5 days of discharge Complete  HRI or [place] Complete  SW Recovery Care/Counseling Consult Complete  [place] Not Applicable

## 2022-04-15 NOTE — Progress Notes (Signed)
Physical Therapy Treatment Patient Details Name: Darlene Barber MRN: [id number] DOB: 12/04/1965 Today's Date: 04/15/2022   History of Present Illness 57 yo female admitted 12/30 to [facility] with abdominal pain and rash with sepsis and AKI. Respiratory failure intubated 1/2-1/11. CRRT (1/3-1/8, 1/10-1/11). pt also with metabolic encephalopathy acutely. PMhx: chronic pain, GERD, bipolar, tobacco and THC abuse, insomnia, HTN, tremor    PT Comments    Pt tolerated today's session well, progressing towards goals which were extended x2 weeks today. Pt ambulating in the room with min guard for safety around obstacles with use of RW, performing sit<>stand transfers with supervision. Pt will continue to benefit from skilled acute PT at this time to progress mobility back to baseline, current discharge plan remains appropriate, however if pt has 24/7 care she may be able to progress towards home with HHPT. Acute PT will continue to follow and update recommendations as needed.     Recommendations for follow up therapy are one component of a multi-disciplinary discharge planning process, led by the attending physician.  Recommendations may be updated based on patient status, additional functional criteria and insurance authorization.  Follow Up Recommendations  Skilled nursing-short term rehab (<3 hours/day) Can patient physically be transported by private vehicle: Yes   Assistance Recommended at Discharge Frequent or constant Supervision/Assistance  Patient can return home with the following A little help with walking and/or transfers;A little help with bathing/dressing/bathroom;Assistance with cooking/housework;Help with stairs or ramp for entrance   Equipment Recommendations  Rolling walker (2 wheels)    Recommendations for Other Services       Precautions / Restrictions Precautions Precautions: Fall Restrictions Weight Bearing Restrictions: No     Mobility  Bed Mobility Overal bed mobility:  Modified Independent Bed Mobility: Supine to Sit, Sit to Supine     Supine to sit: Modified independent (Device/Increase time), HOB elevated Sit to supine: Modified independent (Device/Increase time), HOB elevated   General bed mobility comments: pt utilizing elevated HOB and bed rails but performing bed mobility without assist    Transfers Overall transfer level: Needs assistance Equipment used: Rolling walker (2 wheels) Transfers: Sit to/from Stand, Bed to chair/wheelchair/BSC Sit to Stand: Supervision Stand pivot transfers: Supervision         General transfer comment: Pt provided cueing for hand placement but able to perform 3 sit<>stand trials from EOB, as well as stand pivot to/from [facility], and 2 more trials of sit<>stand for ambulation    Ambulation/Gait Ambulation/Gait assistance: Min guard Gait Distance (Feet): 15 Feet (x2 trials with seated rest break for toileting) Assistive device: Rolling walker (2 wheels) Gait Pattern/deviations: Step-through pattern, Decreased stride length Gait velocity: decreased     General Gait Details: decreased gait speed with use of RW, mild cueing provided for safety with use of RW around obstacles   Stairs             Wheelchair Mobility    Modified Rankin (Stroke Patients Only)       Balance Overall balance assessment: Needs assistance Sitting-balance support: No upper extremity supported, Feet supported Sitting balance-Leahy Scale: Good Sitting balance - Comments: supervision sitting EOB, stable balance with static and dynamic activity   Standing balance support: Bilateral upper extremity supported, During functional activity, Reliant on assistive device for balance Standing balance-Leahy Scale: Fair Standing balance comment: reliant on BUE support for standing and ambulation, utilizing rails on BSC for stand pivot transfer  Cognition Arousal/Alertness: Awake/alert Behavior  During Therapy: Anxious Overall Cognitive Status: No family/caregiver present to determine baseline cognitive functioning                                 General Comments: pt intermittently becomes emotional and anxious but redirectable        Exercises      General Comments General comments (skin integrity, edema, etc.): VSS throughout session      Pertinent Vitals/Pain Pain Assessment Pain Assessment: No/denies pain    Home Living                          Prior Function            PT Goals (current goals can now be found in the care plan section) Acute Rehab PT Goals Patient Stated Goal: to get better PT Goal Formulation: With family Time For Goal Achievement: 04/29/22 Potential to Achieve Goals: Good Progress towards PT goals: Progressing toward goals (goals extended today x2 weeks)    Frequency    Min 2X/week      PT Plan Current plan remains appropriate    Co-evaluation              AM-PAC PT "6 Clicks" Mobility   Outcome Measure  Help needed turning from your back to your side while in a flat bed without using bedrails?: None Help needed moving from lying on your back to sitting on the side of a flat bed without using bedrails?: None Help needed moving to and from a bed to a chair (including a wheelchair)?: A Little Help needed standing up from a chair using your arms (e.g., wheelchair or bedside chair)?: A Little Help needed to walk in hospital room?: A Little Help needed climbing 3-5 steps with a railing? : A Lot 6 Click Score: 19    End of Session   Activity Tolerance: Patient tolerated treatment well Patient left: in bed;with call bell/phone within reach Nurse Communication: Mobility status PT Visit Diagnosis: Muscle weakness (generalized) (M62.81);Difficulty in walking, not elsewhere classified (R26.2)     Time: 4098-1191 PT Time Calculation (min) (ACUTE ONLY): 18 min  Charges:  $Therapeutic Activity: 8-22  mins                     Charlynne Cousins, PT DPT Acute Rehabilitation Services Office [phone]    Luvenia Heller 04/15/2022, 2:35 PM

## 2022-04-15 NOTE — TOC Progression Note (Signed)
Transition of Care [facility]) - Progression Note    Patient Details  Name: KAYLEY ZEIDERS MRN: [id number] Date of Birth: 1965/03/23  [facility] CM/SW Contact  Levonne Lapping, RN Phone Number: 04/15/2022, 4:16 PM  Clinical Narrative:    Patient / Son wanted to speak to CM.  Patient wanting to go home but does not have any support to assist. Sons are concerned and want her to go to recommended SNF. [facility] had already accepted and Patient was in agreement. [facility] called this am and stated they now could not accept patient. Patient is just ready to leave. After much conversation she has agreed to have CSW follow up with the other 2 [facility] SNF's that were considering her. Son Marjory Lies has asked that we call him in am to let him know status.  TOC will continue to follow patient for any additional discharge needs      Expected Discharge Plan: [place] Barriers to Discharge: Continued Medical Work up  Expected Discharge Plan and Services In-house Referral: Clinical Social Work     Living arrangements for the past 2 months: Single Family Home Expected Discharge Date: 04/13/22                                     Social Determinants of Health (SDOH) Interventions SDOH Screenings   Depression (PHQ2-9): High Risk (09/03/2021)  Tobacco Use: High Risk (04/01/2022)    Readmission Risk Interventions    03/11/2022   12:18 PM  Readmission Risk Prevention Plan  Transportation Screening Complete  Medication Review ([place]) Complete  PCP or Specialist appointment within 3-5 days of discharge Complete  HRI or [place] Complete  SW Recovery Care/Counseling Consult Complete  [place] Not Applicable

## 2022-04-16 NOTE — Progress Notes (Signed)
Occupational Therapy Treatment Patient Details Name: Darlene Barber MRN: [id number] DOB: 1965-04-23 Today's Date: 04/16/2022   History of present illness 57 yo female admitted 12/30 to [facility] with abdominal pain and rash with sepsis and AKI. Respiratory failure intubated 1/2-1/11. CRRT (1/3-1/8, 1/10-1/11). pt also with metabolic encephalopathy acutely. PMhx: chronic pain, GERD, bipolar, tobacco and THC abuse, insomnia, HTN, tremor   OT comments  Pt initially attempting to decline OT session but with encouragement, pt agreeable and actually progressing towards OT goals. Focus on functional mobility using RW in room at min guard, standing tolerance during ADLs at sink > 5 min before rest break needed. Emphasis on calming strategies as anxiety primary limiting factor for pt, as well as breathing techniques and energy conservation implementation. Pt's son present, endorses concerns if pt were to DC home alone to second floor apartment as consistent family support unable to be provided. Pt and son both hopeful to obtain rehab bed soon.  HR up to 116bpm   Recommendations for follow up therapy are one component of a multi-disciplinary discharge planning process, led by the attending physician.  Recommendations may be updated based on patient status, additional functional criteria and insurance authorization.    Follow Up Recommendations  Skilled nursing-short term rehab (<3 hours/day)     Assistance Recommended at Discharge Intermittent Supervision/Assistance  Patient can return home with the following  A little help with walking and/or transfers;A little help with bathing/dressing/bathroom;Assistance with cooking/housework;Help with stairs or ramp for entrance;Assist for transportation   Equipment Recommendations  BSC/3in1;Other (comment);Wheelchair (measurements OT);Wheelchair cushion (measurements OT) (RW)    Recommendations for Other Services      Precautions / Restrictions  Precautions Precautions: Fall Restrictions Weight Bearing Restrictions: No       Mobility Bed Mobility Overal bed mobility: Modified Independent Bed Mobility: Supine to Sit, Sit to Supine                Transfers Overall transfer level: Needs assistance Equipment used: Rolling walker (2 wheels) Transfers: Sit to/from Stand Sit to Stand: Supervision                 Balance Overall balance assessment: Needs assistance Sitting-balance support: No upper extremity supported, Feet supported Sitting balance-Leahy Scale: Good     Standing balance support: Bilateral upper extremity supported, During functional activity, Reliant on assistive device for balance Standing balance-Leahy Scale: Fair                             ADL either performed or assessed with clinical judgement   ADL Overall ADL's : Needs assistance/impaired     Grooming: Min guard;Standing;Oral care;Brushing hair Grooming Details (indicate cue type and reason): combing hair and assisted with shampoo cap in standing, cues for anxiety and breathing, standing > 5 min before rest break needed         Upper Body Dressing : Set up;Sitting                   Functional mobility during ADLs: Min guard;Rolling walker (2 wheels) General ADL Comments: Focus on anxiety with calming strategies, breathing techniques and focusing on present activities    Extremity/Trunk Assessment Upper Extremity Assessment Upper Extremity Assessment: Generalized weakness   Lower Extremity Assessment Lower Extremity Assessment: Defer to PT evaluation        Vision   Vision Assessment?: No apparent visual deficits   Perception     Praxis  Cognition Arousal/Alertness: Awake/alert Behavior During Therapy: Anxious Overall Cognitive Status: Impaired/Different from baseline Area of Impairment: Awareness, Safety/judgement, Attention                   Current Attention Level: Selective      Safety/Judgement: Decreased awareness of deficits Awareness: Emergent   General Comments: pt intermittently becomes emotional and anxious but redirectable. follows directions, some decreased insight into deficits and barriers to DC home if increased support not able to be provided        Exercises      Shoulder Instructions       General Comments Son present, endorses concerns regarding if pt was to DC home alone and does not have access to 24/7 support    Pertinent Vitals/ Pain       Pain Assessment Pain Assessment: No/denies pain  Home Living                                          Prior Functioning/Environment              Frequency  Min 2X/week        Progress Toward Goals  OT Goals(current goals can now be found in the care plan section)  Progress towards OT goals: Progressing toward goals  Acute Rehab OT Goals Patient Stated Goal: be able to get a rehab bed or go home OT Goal Formulation: With patient Time For Goal Achievement: 04/30/22 Potential to Achieve Goals: Good  Plan Discharge plan remains appropriate;Frequency remains appropriate    Co-evaluation                 AM-PAC OT "6 Clicks" Daily Activity     Outcome Measure   Help from another person eating meals?: A Little Help from another person taking care of personal grooming?: A Little Help from another person toileting, which includes using toliet, bedpan, or urinal?: A Little Help from another person bathing (including washing, rinsing, drying)?: A Lot Help from another person to put on and taking off regular upper body clothing?: A Little Help from another person to put on and taking off regular lower body clothing?: A Lot 6 Click Score: 16    End of Session Equipment Utilized During Treatment: Gait belt;Rolling walker (2 wheels)  OT Visit Diagnosis: History of falling (Z91.81);Muscle weakness (generalized) (M62.81);Repeated falls (R29.6);Other symptoms  and signs involving cognitive function;Pain   Activity Tolerance Patient tolerated treatment well   Patient Left in bed;with call bell/phone within reach;with family/visitor present   Nurse Communication Mobility status        Time: 3646-8032 OT Time Calculation (min): 36 min  Charges: OT General Charges $OT Visit: 1 Visit OT Treatments $Self Care/Home Management : 23-37 mins  Malachy Chamber, OTR/L Acute Rehab Services Office: [phone]   Layla Maw 04/16/2022, 2:37 PM

## 2022-04-16 NOTE — Progress Notes (Signed)
Mobility Specialist Progress Note   04/16/22 1056  Mobility  Activity Transferred to/from [facility];Ambulated with assistance in hallway  Level of Assistance Minimal assist, patient does 75% or more  Assistive Device Front wheel walker  Distance Ambulated (ft) 18 ft  Activity Response Tolerated well  Mobility Referral Yes  $Mobility charge 1 Mobility   Pre Mobility: HR, 97% SpO2 on RA  During Mobility: HR, 93% SpO2 on RA  Post Mobility: HR, 98% SpO2 on RA   Received pt in bed c/o R hip pain and fearful about ambulation but w/ max encouragement pt agreeable. MinA to EOB using HHA for UE support. Pt requesting to use BSC prior to session, HHA to stand pivot to [facility]. Successful BM. Pt session limited by anxiety today and requiring cues throughout on PLB to combat anxiousness. Able to make in hallway shortly but pt requesting to head back to room. Returned to EOB w/o fault, call bell in reach and bed alarm on.    [place] Falling Mobility Specialist Please contact via SecureChat or  Rehab office at [phone]

## 2022-04-16 NOTE — Progress Notes (Signed)
Patient has discharge order in but no bed offers.

## 2022-04-16 NOTE — TOC Progression Note (Addendum)
Transition of Care [facility]) - Progression Note    Patient Details  Name: Darlene Barber MRN: [id number] Date of Birth: 12-Oct-1965  Transition of Care [facility]) CM/SW [place], LCSW Phone Number: 04/16/2022, 8:46 AM  Clinical Narrative:    8:46am-CSW reached out and is awaiting response from [facility] facilities.   4:26pm-Patient remains with no SNF bed offers.   CSW updated patient's son.    Expected Discharge Plan: [place] Barriers to Discharge: Continued Medical Work up  Expected Discharge Plan and Services In-house Referral: Clinical Social Work     Living arrangements for the past 2 months: Single Family Home Expected Discharge Date: 04/13/22                                     Social Determinants of Health (SDOH) Interventions SDOH Screenings   Depression (PHQ2-9): High Risk (09/03/2021)  Tobacco Use: High Risk (04/01/2022)    Readmission Risk Interventions    03/11/2022   12:18 PM  Readmission Risk Prevention Plan  Transportation Screening Complete  Medication Review ([place]) Complete  PCP or Specialist appointment within 3-5 days of discharge Complete  HRI or [place] Complete  SW Recovery Care/Counseling Consult Complete  [place] Not Applicable

## 2022-04-17 DIAGNOSIS — R6521 Severe sepsis with septic shock: Secondary | ICD-10-CM | POA: Diagnosis not present

## 2022-04-17 DIAGNOSIS — A419 Sepsis, unspecified organism: Secondary | ICD-10-CM | POA: Diagnosis not present

## 2022-04-17 NOTE — Progress Notes (Addendum)
Patient has discharge order in but the bed offer is still pending.

## 2022-04-17 NOTE — Progress Notes (Signed)
Physical Therapy Treatment Patient Details Name: GILBERT NARAIN MRN: [id number] DOB: [birth year] Today's Date: 04/17/2022   History of Present Illness 57 yo female admitted 12/30 to [facility] with abdominal pain and rash with sepsis and AKI. Respiratory failure intubated 1/2-1/11. CRRT (1/3-1/8, 1/10-1/11). pt also with metabolic encephalopathy acutely. PMhx: chronic pain, GERD, bipolar, tobacco and THC abuse, insomnia, HTN, tremor    PT Comments    Pt tolerated today's session well, able to ambulate in the hallway today with min guard for safety and balance. Pt require cueing for proper use of RW as well as relaxation of B shoulders as they are often elevated with mobility. Pt with ~3 standing rest breaks during ambulation due to anxiety and fatigue but overall tolerating ambulation well. Pt will continue to benefit from skilled acute PT to progress mobility, discharge plan remains appropriate.     Recommendations for follow up therapy are one component of a multi-disciplinary discharge planning process, led by the attending physician.  Recommendations may be updated based on patient status, additional functional criteria and insurance authorization.  Follow Up Recommendations  Skilled nursing-short term rehab (<3 hours/day) Can patient physically be transported by private vehicle: Yes   Assistance Recommended at Discharge Frequent or constant Supervision/Assistance  Patient can return home with the following A little help with walking and/or transfers;A little help with bathing/dressing/bathroom;Assistance with cooking/housework;Help with stairs or ramp for entrance   Equipment Recommendations  Rolling walker (2 wheels)    Recommendations for Other Services       Precautions / Restrictions Precautions Precautions: Fall Restrictions Weight Bearing Restrictions: No     Mobility  Bed Mobility Overal bed mobility: Modified Independent Bed Mobility: Supine to Sit, Sit to Supine      Supine to sit: Modified independent (Device/Increase time), HOB elevated Sit to supine: Modified independent (Device/Increase time), HOB elevated   General bed mobility comments: pt utilizing elevated HOB and bed rails but performing bed mobility without assist    Transfers Overall transfer level: Needs assistance Equipment used: Rolling walker (2 wheels) Transfers: Sit to/from Stand, Bed to chair/wheelchair/BSC Sit to Stand: Supervision Stand pivot transfers: Supervision         General transfer comment: Pt provided cueing for hand placement, sit<>stand trials with RW except for stand pivot to/from [facility]    Ambulation/Gait Ambulation/Gait assistance: Min guard Gait Distance (Feet): 50 Feet Assistive device: Rolling walker (2 wheels) Gait Pattern/deviations: Step-through pattern, Decreased stride length Gait velocity: decreased     General Gait Details: decreased gait speed with use of RW, mild cueing provided for safety with use of RW around obstacles, downward gaze noted and increased B shoulder elevation, provided cues throughout for relaxing shoulders and pushing RW like a cart as pt initially picking up to move   Stairs             Wheelchair Mobility    Modified Rankin (Stroke Patients Only)       Balance Overall balance assessment: Needs assistance Sitting-balance support: No upper extremity supported, Feet supported Sitting balance-Leahy Scale: Good Sitting balance - Comments: supervision sitting EOB, stable balance with static and dynamic activity   Standing balance support: Bilateral upper extremity supported, During functional activity, Reliant on assistive device for balance Standing balance-Leahy Scale: Fair Standing balance comment: reliant on BUE support for standing and ambulation, utilizing rails on BSC for stand pivot transfer  Cognition Arousal/Alertness: Awake/alert Behavior During Therapy:  Anxious Overall Cognitive Status: Impaired/Different from baseline Area of Impairment: Safety/judgement                         Safety/Judgement: Decreased awareness of deficits     General Comments: pt intermittently becomes emotional and anxious but redirectable. follows directions, pt's uncle present and educating pt on deficits and barriers to DC home, pt reporting understanding and restating to therapist during session        Exercises      General Comments General comments (skin integrity, edema, etc.): VSS on room air, HR to 120s with ambulation, noted increased anxiety in the hallway but pt cued for deep breathing and seemed to relax with distraction      Pertinent Vitals/Pain Pain Assessment Pain Assessment: No/denies pain    Home Living                          Prior Function            PT Goals (current goals can now be found in the care plan section) Acute Rehab PT Goals Patient Stated Goal: to get better PT Goal Formulation: With family Time For Goal Achievement: 04/29/22 Potential to Achieve Goals: Good Progress towards PT goals: Progressing toward goals    Frequency    Min 2X/week      PT Plan Current plan remains appropriate    Co-evaluation              AM-PAC PT "6 Clicks" Mobility   Outcome Measure  Help needed turning from your back to your side while in a flat bed without using bedrails?: None Help needed moving from lying on your back to sitting on the side of a flat bed without using bedrails?: None Help needed moving to and from a bed to a chair (including a wheelchair)?: A Little Help needed standing up from a chair using your arms (e.g., wheelchair or bedside chair)?: A Little Help needed to walk in hospital room?: A Little Help needed climbing 3-5 steps with a railing? : A Lot 6 Click Score: 19    End of Session   Activity Tolerance: Patient tolerated treatment well Patient left: in bed;with call  bell/phone within reach Nurse Communication: Mobility status PT Visit Diagnosis: Muscle weakness (generalized) (M62.81);Difficulty in walking, not elsewhere classified (R26.2)     Time: 1165-7903 PT Time Calculation (min) (ACUTE ONLY): 27 min  Charges:  $Gait Training: 8-22 mins $Therapeutic Activity: 8-22 mins                     Charlynne Cousins, PT DPT Acute Rehabilitation Services Office [phone]    Luvenia Heller 04/17/2022, 1:17 PM

## 2022-04-17 NOTE — TOC Progression Note (Signed)
Transition of Care [facility]) - Progression Note    Patient Details  Name: Darlene Barber MRN: [id number] Date of Birth: 11-23-1965  Transition of Care [facility]) CM/SW [place], LCSW Phone Number: 04/17/2022, 3:01 PM  Clinical Narrative:    CSW spoke with patient's son Darlene Barber and provided the only SNF bed offer of [facility]. He spoke with patient and she is in agreement to try it. CSW requested [facility]ier start authorization process.    Expected Discharge Plan: Skilled Nursing Facility Barriers to Discharge: Insurance Authorization  Expected Discharge Plan and Services In-house Referral: Clinical Social Work     Living arrangements for the past 2 months: Single Family Home Expected Discharge Date: 04/13/22                                     Social Determinants of Health (SDOH) Interventions SDOH Screenings   Depression (PHQ2-9): High Risk (09/03/2021)  Tobacco Use: High Risk (04/01/2022)    Readmission Risk Interventions    03/11/2022   12:18 PM  Readmission Risk Prevention Plan  Transportation Screening Complete  Medication Review ([place]) Complete  PCP or Specialist appointment within 3-5 days of discharge Complete  HRI or [place] Complete  SW Recovery Care/Counseling Consult Complete  [place] Not Applicable

## 2022-04-17 NOTE — Progress Notes (Signed)
PROGRESS NOTE        PATIENT DETAILS Name: Darlene Barber Age: 57 y.o. Sex: female Date of Birth: Dec 25, 1965 Admit Date: 03/10/2022 Admitting Physician No admitting provider for patient encounter. VEL:[email or web address], Chrystie Nose, MD  Brief Summary:  Patient is a 57 y.o.  female who was initially admitted to the ICU with septic shock-respiratory failure requiring pressors/intubation on initial presentation.  She was subsequently was found to have Streptococcus bacteremia.  Hospital course was complicated by AKI requiring CRRT and acute metabolic encephalopathy/ICU delirium.  She was stabilized and transferred to [facility] on 1/17.  Subsequent hospital course complicated by lower GI bleeding with acute blood loss anemia-requiring transfer back to the ICU.  Ultimately she was found to have large ulcers in her entire colon.  She required multiple units of PRBC transfusion.  She was transferred back to the [facility] service on 1/24.  Significant events: 12/31>> admit to ICU-septic shock-intubated-required pressors stool studies/COVID/RSV/flu negative.  Blood cultures positive for strep 01/3 CRRT 01/5>> TEE done with no evidence of vegetations 01/11>>extubated, transferred from [facility] for iHD 01/17>> transfer to [facility] services 01/18>> developed lower GI bleeding-GI consulted-PRBC transfused 01/18>> transfer back to the ICU 01/20>> colonoscopy with multiple ulcers in colon. 01/24>> transferred to [facility]  Significant studies: 12/30>> CT chest/abdomen/pelvis: Fluid throughout stomach/small bowel/colon-nonspecific 01/05>> CT chest/abdomen/pelvis: Persistent areas of wall thickening along the colon, patchy bilateral ill-defined lung opacities 01/19>> CT abdomen/pelvis: Diarrheal illness with fluid reaching the rectum, colonic wall thickening is improved.  Improving bilateral PNA.  Bilateral nephrolithiasis without obstruction. 01/19>> CT angio GI bleed: Limited evaluation because of oral contrast  within the distal small bowel/throughout colon-active GI bleeding could not be assessed. 01/21>> CT angio GI bleed: No active GI bleeding, multiple infectious/inflammatory colitis involving splenic flexure and ascending colon.  Significant microbiology data: 12/30>> blood culture: Streptococcus Infantarius 12/30>> stool C. difficile negative 12/30>> GI pathogen panel: Negative 01/02>> tracheal aspirate: No organisms 01/03>> blood culture: No growth 01/03>> blood culture: No growth 01/17>> blood culture: No growth  Procedures: 12/31-1/11>> ETT 1/05>> TEE: EF 60-65%-no vegetations 1/20>> colonoscopy: Blood in entire colon, multiple ulcers throughout the colon.  Consults: PCCM GI General surgery Nephrology ID  Subjective:  She denies any complaints today, no significant events overnight as discussed with staff     Objective: Vitals: Blood pressure 125/68, pulse 100, temperature 97.8 F (36.6 C), temperature source Oral, resp. rate (!) 22, height 4\' 10"  (1.473 m), weight 69.1 kg, SpO2 93 %.   Exam:  Awake Alert, Oriented X 3, frail, deconditioned Symmetrical Chest wall movement, Good air movement bilaterally, CTAB RRR,No Gallops,Rubs or new Murmurs, No Parasternal Heave +ve B.Sounds, Abd Soft, No tenderness, No rebound - guarding or rigidity. No Cyanosis, Clubbing or edema, No new Rash or bruise      Assessment/Plan:  Septic shock likely due to GI source, required intubation due to septic shock developed ventilator associated pneumonia, Streptococcus Infantarius bacteremia   Sepsis physiology has resolved TEE negative for vegetations Pneumonia has clinically resolved   Lower GI bleeding due to severe colitis with multiple colon ulcers ischemic versus infectious Acute blood loss anemia Follow CBC trend (8 units PRBC from 1/18-1/21) CT angio without any localized bleeding See antibiotics above Recent stool studies negative Underwent colonoscopy with biopsies  obtained during colonoscopy showing nonspecific inflammation, GI has signed off but thought that infectious versus ischemic colitis  is in differential. Gen.surgery following, CT abdomen from 04/06/2022 noted with continued infectious/inflammatory colitis, case discussed with general surgery, no surgical plans but continue conservative management with bowel rest till abdominal pain much improved,  now tolerating oral Flagyl for her colitis with serial improvement in her symptoms and exam, will continue Flagyl for another 10 days stop date 04/23/2022.  Needs close outpatient follow-up with [facility] within 7 to 10 days of discharge.  Symptoms much improved.  Acute toxic/metabolic encephalopathy ICU delirium Multifactorial etiology-ICU delirium/encephalopathy due to sepsis/AKI Much improved-awake/alert this morning. 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  Replaced.    OSA CPAP when able  Nutrition Status: Nutrition Problem: Inadequate oral intake Etiology: inability to eat Signs/Symptoms: NPO status (on vent) Interventions: MVI  Obesity: Estimated body mass index is 31.84 kg/m as calculated from the following:   Height as of this encounter: 4\' 10"  (1.473 m).   Weight as of this encounter: 69.1 kg.   Code status:   Code Status: Full Code   DVT Prophylaxis: Place and maintain sequential compression device Start: 03/13/22 2117 SCDs  Start: 03/10/22 0232   Family Communication: Son at bedside by previous MD on 04/05/2022   Disposition Plan: Status is: Inpatient Remains inpatient appropriate because: Severity of illness   Planned Discharge Destination:Skilled nursing facility   Diet: Diet Order             DIET SOFT Room service appropriate? No; Fluid consistency: Thin  Diet effective now                    MEDICATIONS: Scheduled Meds:  (feeding supplement) PROSource Plus  30 mL Oral BID BM   acetaminophen  1,000 mg Oral Q6H   amLODipine  10 mg Oral Daily   Chlorhexidine Gluconate Cloth  6 each Topical Daily   fluticasone furoate-vilanterol  1 puff Inhalation Daily   And   umeclidinium bromide  1 puff Inhalation Daily   lidocaine  1 patch Transdermal Q24H   methocarbamol  1,000 mg Oral Q8H   metroNIDAZOLE  500 mg Oral Q12H   multivitamin  1 tablet Oral QHS   nortriptyline  50 mg Oral QHS   mouth rinse  15 mL Mouth Rinse 4 times per day   pantoprazole  40 mg Oral Daily   QUEtiapine  300 mg Oral QHS   sodium bicarbonate  650 mg Oral BID   Vilazodone HCl  40 mg Oral Daily   Continuous Infusions:  sodium chloride Stopped (04/12/22 1000)   PRN Meds:.sodium chloride, albuterol, docusate, haloperidol lactate, labetalol, lip balm, LORazepam, mineral oil-hydrophilic petrolatum, ondansetron (ZOFRAN) IV, mouth rinse, oxyCODONE, polyethylene glycol, promethazine, sodium chloride flush   I have personally reviewed following labs and imaging studies  LABORATORY DATA:  Recent Labs  Lab 04/11/22 0229 04/12/22 0316 04/13/22 0306 04/14/22 0322  WBC 8.6 8.2 7.2 5.4  HGB 11.7* 11.2* 11.3* 11.1*  HCT 35.3* 33.7* 33.5* 33.6*  PLT 258 266 284 320  MCV 91.7 90.8 90.3 89.8  MCH 30.4 30.2 30.5 29.7  MCHC 33.1 33.2 33.7 33.0  RDW 16.4*  16.3* 16.5* 16.8*  LYMPHSABS 1.9 1.8 1.8 1.4  MONOABS 0.7 0.7 0.8 0.8  EOSABS 0.1 0.1 0.0 0.1  BASOSABS 0.1 0.1 0.0 0.0    Recent Labs  Lab 04/11/22 0229  04/12/22 0316 04/13/22 0306 04/14/22 0322  NA 134* 134* 133* 132*  K 3.8 4.0 4.4 4.3  CL 104 108 107 106  CO2 19* 16* 15* 14*  ANIONGAP 11 10 11 12   GLUCOSE 93 82 78 90  BUN 23* 23* 23* 26*  CREATININE 4.89* 4.68* 4.53* 4.41*  CRP  --  3.1* 2.8* 2.4*  BNP 20.6  --   --   --   MG 1.7 1.6* 2.4 2.1  CALCIUM 8.2* 8.1* 8.3* 8.3*    MICROBIOLOGY: No results found for this or any previous visit (from the past 240 hour(s)).   RADIOLOGY STUDIES/RESULTS: No results found.   LOS: 27 days   Signature  Phillips Climes M.D on 04/17/2022 at 1:21 PM   -  To page go to [email or web address]

## 2022-04-17 NOTE — Plan of Care (Signed)
Discharge paperwork reviewed with patient. No further requests have been made at this time. Ivs have been removed.   Problem: Education: Goal: Knowledge of General Education information will improve Description: Including pain rating scale, medication(s)/side effects and non-pharmacologic comfort measures Outcome: Adequate for Discharge   Problem: Health Behavior/Discharge Planning: Goal: Ability to manage health-related needs will improve Outcome: Adequate for Discharge   Problem: Clinical Measurements: Goal: Ability to maintain clinical measurements within normal limits will improve Outcome: Adequate for Discharge Goal: Will remain free from infection Outcome: Adequate for Discharge Goal: Diagnostic test results will improve Outcome: Adequate for Discharge Goal: Respiratory complications will improve Outcome: Adequate for Discharge Goal: Cardiovascular complication will be avoided Outcome: Adequate for Discharge   Problem: Activity: Goal: Risk for activity intolerance will decrease Outcome: Adequate for Discharge   Problem: Nutrition: Goal: Adequate nutrition will be maintained Outcome: Adequate for Discharge   Problem: Coping: Goal: Level of anxiety will decrease Outcome: Adequate for Discharge   Problem: Elimination: Goal: Will not experience complications related to bowel motility Outcome: Adequate for Discharge Goal: Will not experience complications related to urinary retention Outcome: Adequate for Discharge   Problem: Pain Managment: Goal: General experience of comfort will improve Outcome: Adequate for Discharge   Problem: Safety: Goal: Ability to remain free from injury will improve Outcome: Adequate for Discharge   Problem: Skin Integrity: Goal: Risk for impaired skin integrity will decrease Outcome: Adequate for Discharge   Problem: Education: Goal: Ability to describe self-care measures that may prevent or decrease complications (Diabetes Survival  Skills Education) will improve Outcome: Adequate for Discharge Goal: Individualized Educational Video(s) Outcome: Adequate for Discharge   Problem: Coping: Goal: Ability to adjust to condition or change in health will improve Outcome: Adequate for Discharge   Problem: Fluid Volume: Goal: Ability to maintain a balanced intake and output will improve Outcome: Adequate for Discharge   Problem: Health Behavior/Discharge Planning: Goal: Ability to identify and utilize available resources and services will improve Outcome: Adequate for Discharge Goal: Ability to manage health-related needs will improve Outcome: Adequate for Discharge   Problem: Metabolic: Goal: Ability to maintain appropriate glucose levels will improve Outcome: Adequate for Discharge   Problem: Nutritional: Goal: Maintenance of adequate nutrition will improve Outcome: Adequate for Discharge Goal: Progress toward achieving an optimal weight will improve Outcome: Adequate for Discharge   Problem: Skin Integrity: Goal: Risk for impaired skin integrity will decrease Outcome: Adequate for Discharge   Problem: Tissue Perfusion: Goal: Adequacy of tissue perfusion will improve Outcome: Adequate for Discharge   Problem: Activity: Goal: Ability to tolerate increased activity will improve Outcome: Adequate for Discharge   Problem: Respiratory: Goal: Ability to maintain a clear airway and adequate ventilation will improve Outcome: Adequate for Discharge   Problem: Role Relationship: Goal: Method of communication will improve Outcome: Adequate for Discharge

## 2022-04-17 NOTE — Plan of Care (Signed)
  Problem: Activity: Goal: Risk for activity intolerance will decrease Outcome: Progressing   Problem: Nutrition: Goal: Adequate nutrition will be maintained Outcome: Progressing   Problem: Coping: Goal: Level of anxiety will decrease Outcome: Progressing   Problem: Pain Managment: Goal: General experience of comfort will improve Outcome: Progressing   Problem: Safety: Goal: Ability to remain free from injury will improve Outcome: Progressing   

## 2022-04-17 NOTE — Progress Notes (Signed)
Mobility Specialist Progress Note   04/17/22 1800  Mobility  Activity Stood at bedside;Transferred to/from [facility] (~48mins)  Level of Assistance Minimal assist, patient does 75% or more  Assistive Device Other (Comment) (HHA)  Distance Ambulated (ft) 2 ft  Activity Response Tolerated well  Mobility Referral Yes  $Mobility charge 1 Mobility   Assisted pt from [facility] to EOB. Pt deferring further ambulation but agreeable to practicing standing tolerance at EOB. Stood at EOB w/o AD but HHA used to balance, pt requiring mod cues on PLB to combat anxiety. Able to stand for ~57mins then requesting to sit d/t fatigue. Left w/ all needs met and call bell in reach.    Holland Falling Mobility Specialist Please contact via SecureChat or  Rehab office at [phone]

## 2022-04-18 DIAGNOSIS — A419 Sepsis, unspecified organism: Secondary | ICD-10-CM | POA: Diagnosis not present

## 2022-04-18 DIAGNOSIS — G9340 Encephalopathy, unspecified: Secondary | ICD-10-CM | POA: Diagnosis not present

## 2022-04-18 DIAGNOSIS — R6521 Severe sepsis with septic shock: Secondary | ICD-10-CM | POA: Diagnosis not present

## 2022-04-18 MED ORDER — QUETIAPINE FUMARATE 100 MG PO TABS
400.0000 mg | ORAL_TABLET | Freq: Every day | ORAL | Status: DC
  Administered 2022-04-18 – 2022-04-22 (×5): 400 mg via ORAL
  Filled 2022-04-18 (×5): qty 4

## 2022-04-18 NOTE — Progress Notes (Signed)
Physical Therapy Treatment Patient Details Name: Darlene Barber MRN: [id number] DOB: Sep 02, 1965 Today's Date: 04/18/2022   History of Present Illness 57 yo female admitted 12/30 to [facility] with abdominal pain and rash with sepsis and AKI. Respiratory failure intubated 1/2-1/11. CRRT (1/3-1/8, 1/10-1/11). pt also with metabolic encephalopathy acutely. PMhx: chronic pain, GERD, bipolar, tobacco and THC abuse, insomnia, HTN, tremor    PT Comments    Pt tolerated today's session well, limited at the end of the session due to feeling anxious about toileting in the bathroom. Pt performed stand>sit onto the toilet well, grab bar utilized, however pt becoming frantic when getting ready to stand, provided cueing for use of grab bar and RW with increased trunk flexion, however pt unable to stand so minA provided to complete transfer. Pt provided standing rest break for deep breathing and relaxing B shoulders before ambulating back to chair. Pt calm at the end of the session and encouragement and comfort provided. Pt continues to benefit from skilled acute PT to progress mobility, discharge plan remains appropriate.     Recommendations for follow up therapy are one component of a multi-disciplinary discharge planning process, led by the attending physician.  Recommendations may be updated based on patient status, additional functional criteria and insurance authorization.  Follow Up Recommendations  Skilled nursing-short term rehab (<3 hours/day) Can patient physically be transported by private vehicle: Yes   Assistance Recommended at Discharge Frequent or constant Supervision/Assistance  Patient can return home with the following A little help with walking and/or transfers;A little help with bathing/dressing/bathroom;Assistance with cooking/housework;Help with stairs or ramp for entrance   Equipment Recommendations  Rolling walker (2 wheels)    Recommendations for Other Services       Precautions /  Restrictions Precautions Precautions: Fall Restrictions Weight Bearing Restrictions: No     Mobility  Bed Mobility               General bed mobility comments: pt seated in chair upon arrival, left in chair at end of session    Transfers Overall transfer level: Needs assistance Equipment used: Rolling walker (2 wheels) Transfers: Sit to/from Stand Sit to Stand: Supervision, Min assist           General transfer comment: supervision for sit<>stand from chair and items pt comfortable with. Pt requiring minA for standing from toilet, pt with increased anxiety. Provided cueing for use of grab bar and increased trunk flexion but pt not clearing bottom with attempts so minA provided and able to complete stand    Ambulation/Gait Ambulation/Gait assistance: Min guard Gait Distance (Feet): 15 Feet (x2 trials) Assistive device: Rolling walker (2 wheels) Gait Pattern/deviations: Step-through pattern, Decreased stride length Gait velocity: decreased     General Gait Details: mild cueing provided for RW proximity and B shoulder elevation, correcting with cues. Pt ambulating to/from the bathroom today and declining any further ambulation attempts due to increased anxiety   Stairs             Wheelchair Mobility    Modified Rankin (Stroke Patients Only)       Balance Overall balance assessment: Needs assistance Sitting-balance support: No upper extremity supported, Feet supported Sitting balance-Leahy Scale: Good Sitting balance - Comments: supervision sitting, stable balance with static and dynamic activity   Standing balance support: Bilateral upper extremity supported, During functional activity, Reliant on assistive device for balance Standing balance-Leahy Scale: Fair Standing balance comment: reliant on BUE support for standing and ambulation, use of grab bar in  bathroom with standing and transfer to/from sitting                             Cognition Arousal/Alertness: Awake/alert Behavior During Therapy: Anxious Overall Cognitive Status: Impaired/Different from baseline Area of Impairment: Safety/judgement                         Safety/Judgement: Decreased awareness of deficits     General Comments: pt intermittently becomes emotional and anxious but redirectable. follows directions. Pt attempting toileting in the bathroom and noticed increased anxiety but comfort provided        Exercises      General Comments General comments (skin integrity, edema, etc.): VSS on room air      Pertinent Vitals/Pain Pain Assessment Pain Assessment: No/denies pain    Home Living                          Prior Function            PT Goals (current goals can now be found in the care plan section) Acute Rehab PT Goals Patient Stated Goal: to get better PT Goal Formulation: With family Time For Goal Achievement: 04/29/22 Potential to Achieve Goals: Good Progress towards PT goals: Progressing toward goals    Frequency    Min 2X/week      PT Plan Current plan remains appropriate    Co-evaluation              AM-PAC PT "6 Clicks" Mobility   Outcome Measure  Help needed turning from your back to your side while in a flat bed without using bedrails?: None Help needed moving from lying on your back to sitting on the side of a flat bed without using bedrails?: None Help needed moving to and from a bed to a chair (including a wheelchair)?: A Little Help needed standing up from a chair using your arms (e.g., wheelchair or bedside chair)?: A Little Help needed to walk in hospital room?: A Little Help needed climbing 3-5 steps with a railing? : A Lot 6 Click Score: 19    End of Session   Activity Tolerance: Patient tolerated treatment well Patient left: with call bell/phone within reach;in chair Nurse Communication: Mobility status PT Visit Diagnosis: Muscle weakness (generalized)  (M62.81);Difficulty in walking, not elsewhere classified (R26.2)     Time: 1610-9604 PT Time Calculation (min) (ACUTE ONLY): 17 min  Charges:  $Therapeutic Activity: 8-22 mins                     Charlynne Cousins, PT DPT Acute Rehabilitation Services Office [phone]    Luvenia Heller 04/18/2022, 1:23 PM

## 2022-04-18 NOTE — Progress Notes (Signed)
PROGRESS NOTE        PATIENT DETAILS Name: Darlene Barber Age: 57 y.o. Sex: female Date of Birth: Jul 31, 1965 Admit Date: 03/10/2022 Admitting Physician No admitting provider for patient encounter. ZOX:[id number], Chrystie Nose, MD  Brief Summary:  Patient is a 57 y.o.  female who was initially admitted to the ICU with septic shock-respiratory failure requiring pressors/intubation on initial presentation.  She was subsequently was found to have Streptococcus bacteremia.  Hospital course was complicated by AKI requiring CRRT and acute metabolic encephalopathy/ICU delirium.  She was stabilized and transferred to [facility] on 1/17.  Subsequent hospital course complicated by lower GI bleeding with acute blood loss anemia-requiring transfer back to the ICU.  Ultimately she was found to have large ulcers in her entire colon.  She required multiple units of PRBC transfusion.  She was transferred back to the [facility] service on 1/24.  Significant events: 12/31>> admit to ICU-septic shock-intubated-required pressors stool studies/COVID/RSV/flu negative.  Blood cultures positive for strep 01/3 CRRT 01/5>> TEE done with no evidence of vegetations 01/11>>extubated, transferred from [facility] for iHD 01/17>> transfer to [facility] services 01/18>> developed lower GI bleeding-GI consulted-PRBC transfused 01/18>> transfer back to the ICU 01/20>> colonoscopy with multiple ulcers in colon. 01/24>> transferred to [facility]  Significant studies: 12/30>> CT chest/abdomen/pelvis: Fluid throughout stomach/small bowel/colon-nonspecific 01/05>> CT chest/abdomen/pelvis: Persistent areas of wall thickening along the colon, patchy bilateral ill-defined lung opacities 01/19>> CT abdomen/pelvis: Diarrheal illness with fluid reaching the rectum, colonic wall thickening is improved.  Improving bilateral PNA.  Bilateral nephrolithiasis without obstruction. 01/19>> CT angio GI bleed: Limited evaluation because of oral contrast  within the distal small bowel/throughout colon-active GI bleeding could not be assessed. 01/21>> CT angio GI bleed: No active GI bleeding, multiple infectious/inflammatory colitis involving splenic flexure and ascending colon.  Significant microbiology data: 12/30>> blood culture: Streptococcus Infantarius 12/30>> stool C. difficile negative 12/30>> GI pathogen panel: Negative 01/02>> tracheal aspirate: No organisms 01/03>> blood culture: No growth 01/03>> blood culture: No growth 01/17>> blood culture: No growth  Procedures: 12/31-1/11>> ETT 1/05>> TEE: EF 60-65%-no vegetations 1/20>> colonoscopy: Blood in entire colon, multiple ulcers throughout the colon.  Consults: PCCM GI General surgery Nephrology ID  Subjective:  She denies any complaints today, no significant events overnight  Objective: Vitals: Blood pressure 124/85, pulse (!) 103, temperature 98.2 F (36.8 C), temperature source Oral, resp. rate 19, height 4\' 10"  (1.473 m), weight 66.2 kg, SpO2 99 %.   Exam:  Awake Alert, Oriented X 3, frail, deconditioned Symmetrical Chest wall movement, Good air movement bilaterally, CTAB RRR,No Gallops,Rubs or new Murmurs, No Parasternal Heave +ve B.Sounds, Abd Soft, No tenderness, No rebound - guarding or rigidity. No Cyanosis, Clubbing or edema, No new Rash or bruise       Assessment/Plan:  Septic shock likely due to GI source, required intubation due to septic shock developed ventilator associated pneumonia, Streptococcus Infantarius bacteremia   Sepsis physiology has resolved TEE negative for vegetations Pneumonia has clinically resolved   Lower GI bleeding due to severe colitis with multiple colon ulcers ischemic versus infectious Acute blood loss anemia Follow CBC trend (8 units PRBC from 1/18-1/21) CT angio without any localized bleeding See antibiotics above Recent stool studies negative Underwent colonoscopy with biopsies obtained during colonoscopy  showing nonspecific inflammation, GI has signed off but thought that infectious versus ischemic colitis is in differential. Gen.surgery following, CT  abdomen from 04/06/2022 noted with continued infectious/inflammatory colitis, case discussed with general surgery, no surgical plans but continue conservative management with bowel rest till abdominal pain much improved,  now tolerating oral Flagyl for her colitis with serial improvement in her symptoms and exam, will continue Flagyl for another 10 days stop date 04/23/2022.  Needs close outpatient follow-up with [facility] within 7 to 10 days of discharge.  Symptoms much improved.  Acute toxic/metabolic encephalopathy ICU delirium Multifactorial etiology-ICU delirium/encephalopathy due to sepsis/AKI Maintain delirium precautions Continue Seroquel 300 nightly Continue vilazodone -He remains high risk for delirium, continue with frequent reorientation, and delirium precautions.  AKI Multifactorial etiology-felt to be ischemic ATN from septic shock-possibly some component of infectious (strep) glomerulonephritis-and now possibly contrast related (CT angio)  Avoid nephrotoxic agents Nephrology signed off  Shock liver Improved Trend LFTs periodically  HTN BP stable Continue amlodipine  GERD PPI  Bipolar disorder/depression Complicated by encephalopathy Continue Seroquel/vilazodone  COPD Not in exacerbation-continue bronchodilators  Hypomagnesemia.  Replaced.    OSA CPAP when able  Nutrition Status: Nutrition Problem: Inadequate oral intake Etiology: inability to eat Signs/Symptoms: NPO status (on vent) Interventions: MVI  Obesity: Estimated body mass index is 30.5 kg/m as calculated from the following:   Height as of this encounter: 4\' 10"  (1.473 m).   Weight as of this encounter: 66.2 kg.   Code status:   Code Status: Full Code   DVT Prophylaxis: Place and maintain sequential compression  device Start: 03/13/22 2117 SCDs Start: 03/10/22 0232   Family Communication: None at bedside   Disposition Plan: Status is: Inpatient Remains inpatient appropriate because: Severity of illness   Planned Discharge Destination:Skilled nursing facility   Diet: Diet Order             DIET SOFT Room service appropriate? No; Fluid consistency: Thin  Diet effective now                    MEDICATIONS: Scheduled Meds:  (feeding supplement) PROSource Plus  30 mL Oral BID BM   acetaminophen  1,000 mg Oral Q6H   amLODipine  10 mg Oral Daily   Chlorhexidine Gluconate Cloth  6 each Topical Daily   fluticasone furoate-vilanterol  1 puff Inhalation Daily   And   umeclidinium bromide  1 puff Inhalation Daily   lidocaine  1 patch Transdermal Q24H   methocarbamol  1,000 mg Oral Q8H   metroNIDAZOLE  500 mg Oral Q12H   multivitamin  1 tablet Oral QHS   nortriptyline  50 mg Oral QHS   mouth rinse  15 mL Mouth Rinse 4 times per day   pantoprazole  40 mg Oral Daily   QUEtiapine  300 mg Oral QHS   sodium bicarbonate  650 mg Oral BID   Vilazodone HCl  40 mg Oral Daily   Continuous Infusions:  sodium chloride Stopped (04/12/22 1000)   PRN Meds:.sodium chloride, albuterol, docusate, haloperidol lactate, labetalol, lip balm, LORazepam, mineral oil-hydrophilic petrolatum, ondansetron (ZOFRAN) IV, mouth rinse, oxyCODONE, polyethylene glycol, promethazine, sodium chloride flush   I have personally reviewed following labs and imaging studies  LABORATORY DATA:  Recent Labs  Lab 04/12/22 0316 04/13/22 0306 04/14/22 0322  WBC 8.2 7.2 5.4  HGB 11.2* 11.3* 11.1*  HCT 33.7* 33.5* 33.6*  PLT 266 284 320  MCV 90.8 90.3 89.8  MCH 30.2 30.5 29.7  MCHC 33.2 33.7 33.0  RDW 16.3* 16.5* 16.8*  LYMPHSABS 1.8 1.8 1.4  MONOABS 0.7 0.8  0.8  EOSABS 0.1 0.0 0.1  BASOSABS 0.1 0.0 0.0    Recent Labs  Lab 04/12/22 0316 04/13/22 0306 04/14/22 0322  NA 134* 133* 132*  K 4.0 4.4 4.3  CL 108  107 106  CO2 16* 15* 14*  ANIONGAP 10 11 12   GLUCOSE 82 78 90  BUN 23* 23* 26*  CREATININE 4.68* 4.53* 4.41*  CRP 3.1* 2.8* 2.4*  MG 1.6* 2.4 2.1  CALCIUM 8.1* 8.3* 8.3*    MICROBIOLOGY: No results found for this or any previous visit (from the past 240 hour(s)).   RADIOLOGY STUDIES/RESULTS: No results found.   LOS: 52 days   Signature  -    Phillips Climes M.D on 04/18/2022 at 11:41 AM   -  To page go to [email or web address]

## 2022-04-18 NOTE — Plan of Care (Signed)
  Problem: Education: Goal: Knowledge of General Education information will improve Description: Including pain rating scale, medication(s)/side effects and non-pharmacologic comfort measures Outcome: Progressing   Problem: Health Behavior/Discharge Planning: Goal: Ability to manage health-related needs will improve Outcome: Progressing   Problem: Clinical Measurements: Goal: Will remain free from infection Outcome: Progressing   

## 2022-04-18 NOTE — Progress Notes (Signed)
Nutrition Follow-up  DOCUMENTATION CODES:   Obesity unspecified  INTERVENTION:   Continue Renal Multivitamin w/ minerals daily Encourage good PO intake Continue 30 ml ProSource Plus BID, each supplement provides 100 kcals and 15 grams protein.   NUTRITION DIAGNOSIS:   Inadequate oral intake related to inability to eat as evidenced by NPO status (on vent). - Progressing, diet advanced  GOAL:   Patient will meet greater than or equal to 90% of their needs - Ongoing  MONITOR:   PO intake, Labs, Weight trends, I & O's  REASON FOR ASSESSMENT:   Consult Enteral/tube feeding initiation and management (trickle TF with recs)  ASSESSMENT:   57 yo female w/ pertinent PMH HTN, T2DM, OSA, urge incontinence, bipolar depression presents to ED on 12/30 w/ rash and sob. Found to have sepsis and acute hypoxemic respiratory failure.  12/30 - admit 01/01 - intubated, trickle TF started 01/03 - CRRT start 01/04 - TF advanced to goal rate 01/08 - CRRT stop 01/10 - CRRT restart 01/11 - extubated, CRRT stop, transferred to [facility] for iHD 01/12 - Cortrak placed (tip gastric) 01/16 - diet advanced to full liquids 01/17 - diet advanced to dysphagia 3 01/18 - TF changed to nocturnal (never started) 01/19 - clear liquids 01/20 - NPO, s/p colonoscopy showing large amount of blood and multiple ulcerations on the R side of the colon (no source identified), s/p EGD with no source of bleeding, Cortrak removed 01/21 - recurrent GI bleed 01/22 - diet advanced to clear liquids 01/23 - transferred to floor 01/29 - diet advanced to Soft  Discussed pt in rounds. Pt medically stable for discharge, currently pending insurance authorization for SNF. Pt on a SOFT diet, PO intake varies. Has declined supplements in the past. Currently with ProSource Plus ordered - BID, appears that pt continues to refuse that majority of the time.   Medications and labs reviewed.   Diet Order:   Diet Order              DIET SOFT Room service appropriate? No; Fluid consistency: Thin  Diet effective now                   EDUCATION NEEDS:   No education needs have been identified at this time  Skin:  Skin Assessment: Skin Integrity Issues: Skin Integrity Issues:: Other (Comment) Other: skin tear L knee, MASD groin/abd/inner thighs, MARSI bilateral buttocks  Last BM:  2/7 - Type 7  Height:  Ht Readings from Last 1 Encounters:  03/12/22 4\' 10"  (1.473 m)   Weight:  Wt Readings from Last 1 Encounters:  04/18/22 66.2 kg   Ideal Body Weight:  43.2 kg  BMI:  Body mass index is 30.5 kg/m.  Estimated Nutritional Needs:  Kcal:  1700-1900 Protein:  85-100 grams Fluid:  1L+UOP   Hermina Barters RD, LDN Clinical Dietitian See [facility] for contact information.

## 2022-04-18 NOTE — Plan of Care (Signed)
  Problem: Education: Goal: Knowledge of General Education information will improve Description: Including pain rating scale, medication(s)/side effects and non-pharmacologic comfort measures 04/18/2022 0237 by Zadie Rhine, RN Outcome: Progressing 04/18/2022 0227 by Zadie Rhine, RN Outcome: Progressing 04/18/2022 0227 by Zadie Rhine, RN Outcome: Progressing   Problem: Health Behavior/Discharge Planning: Goal: Ability to manage health-related needs will improve 04/18/2022 0237 by Zadie Rhine, RN Outcome: Progressing 04/18/2022 0227 by Zadie Rhine, RN Outcome: Progressing 04/18/2022 0227 by Zadie Rhine, RN Outcome: Progressing   Problem: Clinical Measurements: Goal: Ability to maintain clinical measurements within normal limits will improve Outcome: Progressing Goal: Will remain free from infection 04/18/2022 0237 by Zadie Rhine, RN Outcome: Progressing 04/18/2022 0227 by Zadie Rhine, RN Outcome: Progressing 04/18/2022 0227 by Zadie Rhine, RN Outcome: Progressing Goal: Diagnostic test results will improve 04/18/2022 0237 by Zadie Rhine, RN Outcome: Progressing 04/18/2022 0227 by Zadie Rhine, RN Outcome: Progressing Goal: Respiratory complications will improve Outcome: Progressing   Problem: Education: Goal: Knowledge of General Education information will improve Description: Including pain rating scale, medication(s)/side effects and non-pharmacologic comfort measures 04/18/2022 0237 by Zadie Rhine, RN Outcome: Progressing 04/18/2022 0227 by Zadie Rhine, RN Outcome: Progressing 04/18/2022 0227 by Zadie Rhine, RN Outcome: Progressing   Problem: Education: Goal: Knowledge of General Education information will improve Description: Including pain rating scale, medication(s)/side effects and non-pharmacologic comfort measures 04/18/2022 0237 by Zadie Rhine, RN Outcome: Progressing 04/18/2022 0227 by Zadie Rhine, RN Outcome: Progressing 04/18/2022 0227 by Zadie Rhine, RN Outcome: Progressing   Problem: Health Behavior/Discharge Planning: Goal: Ability to manage health-related needs will improve 04/18/2022 0237 by Zadie Rhine, RN Outcome: Progressing 04/18/2022 0227 by Zadie Rhine, RN Outcome: Progressing 04/18/2022 0227 by Zadie Rhine, RN Outcome: Progressing   Problem: Clinical Measurements: Goal: Ability to maintain clinical measurements within normal limits will improve Outcome: Progressing Goal: Will remain free from infection 04/18/2022 0237 by Zadie Rhine, RN Outcome: Progressing 04/18/2022 0227 by Zadie Rhine, RN Outcome: Progressing 04/18/2022 0227 by Zadie Rhine, RN Outcome: Progressing Goal: Diagnostic test results will improve 04/18/2022 0237 by Zadie Rhine, RN Outcome: Progressing 04/18/2022 0227 by Zadie Rhine, RN Outcome: Progressing Goal: Respiratory complications will improve Outcome: Progressing

## 2022-04-18 NOTE — TOC Progression Note (Signed)
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 Son updated at bedside.    Expected Discharge Plan: [place] Barriers to Discharge: Continued Medical Work up  Expected Discharge Plan and Services In-house Referral: Clinical Social Work     Living arrangements for the past 2 months: Single Family Home Expected Discharge Date: 04/13/22                                     Social Determinants of Health (SDOH) Interventions SDOH Screenings   Depression (PHQ2-9): High Risk (09/03/2021)  Tobacco Use: High Risk (04/01/2022)    Readmission Risk Interventions    03/11/2022   12:18 PM  Readmission Risk Prevention Plan  Transportation Screening Complete  Medication Review ([place]) Complete  PCP or Specialist appointment within 3-5 days of discharge Complete  HRI or [place] Complete  SW Recovery Care/Counseling Consult Complete  [place] Not Applicable

## 2022-04-18 NOTE — Plan of Care (Signed)
  Problem: Education: Goal: Knowledge of General Education information will improve Description: Including pain rating scale, medication(s)/side effects and non-pharmacologic comfort measures 04/18/2022 0227 by Zadie Rhine, RN Outcome: Progressing 04/18/2022 0227 by Zadie Rhine, RN Outcome: Progressing   Problem: Clinical Measurements: Goal: Ability to maintain clinical measurements within normal limits will improve Outcome: Progressing Goal: Will remain free from infection 04/18/2022 0227 by Zadie Rhine, RN Outcome: Progressing 04/18/2022 0227 by Zadie Rhine, RN Outcome: Progressing Goal: Diagnostic test results will improve Outcome: Progressing

## 2022-04-19 ENCOUNTER — Inpatient Hospital Stay (HOSPITAL_COMMUNITY): Payer: Medicaid Other

## 2022-04-19 DIAGNOSIS — K529 Noninfective gastroenteritis and colitis, unspecified: Secondary | ICD-10-CM | POA: Diagnosis not present

## 2022-04-19 DIAGNOSIS — A419 Sepsis, unspecified organism: Secondary | ICD-10-CM | POA: Diagnosis not present

## 2022-04-19 DIAGNOSIS — G9340 Encephalopathy, unspecified: Secondary | ICD-10-CM | POA: Diagnosis not present

## 2022-04-19 DIAGNOSIS — E872 Acidosis, unspecified: Secondary | ICD-10-CM

## 2022-04-19 DIAGNOSIS — J9601 Acute respiratory failure with hypoxia: Secondary | ICD-10-CM | POA: Diagnosis not present

## 2022-04-19 LAB — CBC
HCT: 35.3 % — ABNORMAL LOW (ref 36.0–46.0)
Hemoglobin: 11.6 g/dL — ABNORMAL LOW (ref 12.0–15.0)
MCH: 30.5 pg (ref 26.0–34.0)
MCHC: 32.9 g/dL (ref 30.0–36.0)
MCV: 92.9 fL (ref 80.0–100.0)
Platelets: 496 10*3/uL — ABNORMAL HIGH (ref 150–400)
RBC: 3.8 MIL/uL — ABNORMAL LOW (ref 3.87–5.11)
RDW: 18.5 % — ABNORMAL HIGH (ref 11.5–15.5)
WBC: 10.7 10*3/uL — ABNORMAL HIGH (ref 4.0–10.5)
nRBC: 0 % (ref 0.0–0.2)

## 2022-04-19 LAB — BLOOD GAS, VENOUS
Acid-base deficit: 21.7 mmol/L — ABNORMAL HIGH (ref 0.0–2.0)
Bicarbonate: 5.1 mmol/L — ABNORMAL LOW (ref 20.0–28.0)
Drawn by: 67249
O2 Saturation: 86.9 %
Patient temperature: 36.9
pCO2, Ven: <18 mmHg — CL (ref 44–60)
pH, Ven: 7.14 — CL (ref 7.25–7.43)
pO2, Ven: 54 mmHg — ABNORMAL HIGH (ref 32–45)

## 2022-04-19 LAB — RAPID URINE DRUG SCREEN, HOSP PERFORMED
Amphetamines: NOT DETECTED
Barbiturates: NOT DETECTED
Benzodiazepines: NOT DETECTED
Cocaine: NOT DETECTED
Opiates: NOT DETECTED
Tetrahydrocannabinol: NOT DETECTED

## 2022-04-19 LAB — BASIC METABOLIC PANEL
BUN: 26 mg/dL — ABNORMAL HIGH (ref 6–20)
BUN: 28 mg/dL — ABNORMAL HIGH (ref 6–20)
CO2: <7 mmol/L — ABNORMAL LOW (ref 22–32)
CO2: <7 mmol/L — ABNORMAL LOW (ref 22–32)
Calcium: 9.2 mg/dL (ref 8.9–10.3)
Calcium: 9.5 mg/dL (ref 8.9–10.3)
Chloride: 108 mmol/L (ref 98–111)
Chloride: 107 mmol/L (ref 98–111)
Creatinine, Ser: 4.08 mg/dL — ABNORMAL HIGH (ref 0.44–1.00)
Creatinine, Ser: 4.19 mg/dL — ABNORMAL HIGH (ref 0.44–1.00)
GFR, Estimated: 12 mL/min — ABNORMAL LOW (ref >60)
GFR, Estimated: 12 mL/min — ABNORMAL LOW (ref >60)
Glucose, Bld: 115 mg/dL — ABNORMAL HIGH (ref 70–99)
Glucose, Bld: 119 mg/dL — ABNORMAL HIGH (ref 70–99)
Potassium: 4.6 mmol/L (ref 3.5–5.1)
Potassium: 4.7 mmol/L (ref 3.5–5.1)
Sodium: 135 mmol/L (ref 135–145)
Sodium: 136 mmol/L (ref 135–145)

## 2022-04-19 LAB — URINALYSIS, ROUTINE W REFLEX MICROSCOPIC
Glucose, UA: NEGATIVE mg/dL
Hgb urine dipstick: NEGATIVE
Ketones, ur: 5 mg/dL — AB
Nitrite: NEGATIVE
Protein, ur: 30 mg/dL — AB
Specific Gravity, Urine: 1.013 (ref 1.005–1.030)
pH: 5 (ref 5.0–8.0)

## 2022-04-19 LAB — GLUCOSE, CAPILLARY: Glucose-Capillary: 96 mg/dL (ref 70–99)

## 2022-04-19 LAB — LACTIC ACID, PLASMA
Lactic Acid, Venous: 0.8 mmol/L (ref 0.5–1.9)
Lactic Acid, Venous: 1 mmol/L (ref 0.5–1.9)

## 2022-04-19 MED ORDER — SODIUM BICARBONATE 650 MG PO TABS
1300.0000 mg | ORAL_TABLET | Freq: Two times a day (BID) | ORAL | Status: DC
  Administered 2022-04-19: 1300 mg via ORAL
  Filled 2022-04-19: qty 2

## 2022-04-19 MED ORDER — SODIUM BICARBONATE 8.4 % IV SOLN
100.0000 meq | Freq: Once | INTRAVENOUS | Status: AC
  Administered 2022-04-19: 100 meq via INTRAVENOUS

## 2022-04-19 MED ORDER — SODIUM BICARBONATE 8.4 % IV SOLN
INTRAVENOUS | Status: DC
  Filled 2022-04-19: qty 1000

## 2022-04-19 MED ORDER — SODIUM BICARBONATE 8.4 % IV SOLN
INTRAVENOUS | Status: DC
  Filled 2022-04-19 (×4): qty 1000

## 2022-04-19 MED ORDER — SODIUM BICARBONATE 8.4 % IV SOLN
100.0000 meq | Freq: Once | INTRAVENOUS | Status: AC
  Administered 2022-04-19: 100 meq via INTRAVENOUS
  Filled 2022-04-19: qty 50

## 2022-04-19 MED ORDER — SODIUM BICARBONATE 8.4 % IV SOLN
INTRAVENOUS | Status: AC
  Filled 2022-04-19: qty 100

## 2022-04-19 MED ORDER — SODIUM BICARBONATE 8.4 % IV SOLN
50.0000 meq | Freq: Once | INTRAVENOUS | Status: AC
  Administered 2022-04-19: 50 meq via INTRAVENOUS
  Filled 2022-04-19: qty 50

## 2022-04-19 NOTE — Progress Notes (Addendum)
NAME:  Darlene Barber, MRN:  [id number], DOB:  November 29, 1965, LOS: 40 ADMISSION DATE:  03/10/2022, CONSULTATION DATE:  12/30  REFERRING MD:  Dr. Cinda Quest, CHIEF COMPLAINT:  Abdominal Pain    History of Present Illness:  [name] who presented to the [facility] ER on 12/31 with reports of abdominal pain and widespread rash.  On admission, the patient significant other reported that the day after eating at a local restaurant in [place], [place] she developed abdominal pain and rash.  She was reportedly up all night with crampy abdominal pain associated with nausea and vomiting.  Around 2 PM on the day of admission she noticed a rash all over her body and had worsening pain prompting him to call EMS.  She was evaluated in the emergency room and was found to have oxygen saturations of 87% on 2 L, afebrile with an elevated white blood cell count of 29.2.  Additional labs notable for acute kidney injury, anion gap metabolic acidosis and LFTs with mixed hepatic and cholestatic picture.  Stool samples were sent with workup of acute diarrhea.  Stool pathogen panel was negative.  C. difficile toxin was negative.  She was treated with 30 mL/kg IV fluid and started on empiric broad-spectrum antibiotics with vancomycin, cefepime and Flagyl.  She remained hypotensive despite IV fluid boluses and was started on vasopressors.  She was transferred to [facility] as there were no inpatient beds at [facility].    PCCM consulted for ICU admission.  Pertinent  Medical History  Chronic Pain Syndrome  Cervical Spondylitis  GERD  Bipolar Disorder  Tobacco Abuse - daily  THC Abuse - daily   Significant Hospital Events: Including procedures, antibiotic start and stop dates in addition to other pertinent events   12/31 admit with abdominal pain, diarrhea.  Left IJ TLC placement.  UDS positive for tricyclics, THC and amphetamines.  C. difficile negative, GI PCR negative.  COVID, flu, RSV negative.  Antibiotics narrowed to  Zosyn. BC positives for strep infantarius.  CT chest abdomen pelvis with fluid throughout the stomach, small bowel and colon, nonspecific but may represent diarrheal state.  No bowel obstruction, definite bowel wall thickening or pneumoperitoneum.  Fluid within the esophagus may represent reflux or nonspecific esophageal motility disorder.  Trace amount of free pelvic fluid, nonspecific.   1/3 CRRT, PEEP 5/60% 1/4 CRRT with negative balance. Precedex added to Fentanyl.  1/5 CT chest abd pelvis, TEE done with no evidence of vegetations 1/8 Taken off CRRT, MIVF started 1/9 too wild on precedex, severe HTN prevented extubation 1/10 restarted on CRRT 1/11 extubated, transferred from [facility] for iHD 1/13 Clonazepam 0.5 twice daily, Seroquel to twice daily 1/14 Precedex weaned to off this morning 03/29/2022 critical care called back for new GI bleed transferred back to intensive care unit GI has been consulted 1/20 continue GI bleed overnight, hemoglobin dropped to 6.41 unit PRBCs ordered 1/21 recurrent GI bleed overnight with additional PRBCs, platelets, and FFP ordered.  Decision made to engage general surgery again.  Interim History / Subjective:   Transferred back to intensive care unit for worsening acidosis in the setting of renal failure  Objective   Blood pressure 97/71, pulse (!) 102, temperature 97.6 F (36.4 C), temperature source Oral, resp. rate 17, height 4' 10"$  (1.473 m), weight 66.2 kg, SpO2 96 %.        Intake/Output Summary (Last 24 hours) at 04/19/2022 1143 Last data filed at 04/18/2022 2015 Gross per  24 hour  Intake 240 ml  Output 350 ml  Net -110 ml   Filed Weights   04/12/22 0500 04/17/22 0500 04/18/22 0500  Weight: 67.7 kg 69.1 kg 66.2 kg    Examination: General: Frail female who looks much older than stated age of 57 HEENT: MM pink/moist no JVD is present Neuro: Continues to be confused keeps saying that Elvis is in the house. CV: Heart sounds are regular PULM:  Diminished in the bases on room air  GI: soft, bsx4 active  Extremities: warm/dry, 1+ edema  Skin: no rashes or lesions   Resolved Hospital Problem list   Hypotension - sepsis + sedation  Transaminitis Shock Liver Rash, Hives TCP related to CRRT, sepsis Multifocal pneumonia, possible VAP, completed course 1/11 Possible melena  Assessment & Plan:  Acidosis most likely secondary to worsening renal failure Transfer to intensive care unit Bicarbonate drip Renal to revisit She may need hemodialysis again    GI bleeding in the setting of multiple colonic ulcers ischemic related -Colonoscopy performed 1/20 with multiple colonic ulcers seen unable to identify active bleed with poor visualization.  GI recommended either repeating CTA to help with an upper bleed versus colectomy.  CTA was repeated overnight 1/21 with no signs of bleeding seen despite gross active GI bleed.  General surgery updated and is following. Acute blood loss anemia superimposed on normocytic anemia  Recent Labs    04/19/22 0515  HGB 11.6*    P: She has been seen by GI and general surgery No GI bleed at this time Monitor hemoglobin  Acute Metabolic / Toxic Encephalopathy  -Remains confused, awakens to voice and tracking but not following commands or speaking. Likely related to sedation in setting of poor renal function. P: Continue Seroquel Continue vilazodone Seroquel 357m qHS      Renal Failure, likely ATN   Lab Results  Component Value Date   CREATININE 4.19 (H) 04/19/2022   CREATININE 4.08 (H) 04/19/2022   CREATININE 4.41 (H) 04/14/2022   CREATININE 0.83 09/30/2019   CREATININE 0.74 11/24/2018   CREATININE 0.78 05/22/2018    Worsening renal failure P: Reinvolve nephrology Avoid nephrotoxins Correct acidosis  UDS Positive: Amphetamines, Cannabis, TCA's  P: Repeat toxicology for completeness  Bipolar Disorder Chronic Pain  P: Continue Seroquel  Chronic Nausea P: As needed  Zofran  Hyperglycemia CBG (last 3)  No results for input(s): "GLUCAP" in the last 72 hours.  P: Sliding-scale insulin coverage  HTN P: Antihypertensives as needed At Risk Malnutrition  Hypoalonemia  P: Diet as tolerated    Best Practice (right click and "Reselect all SmartList Selections" daily)   Diet/type: clear liquids  DVT prophylaxis: not indicated GI prophylaxis: PPI Lines: Central line and Dialysis Catheter (PICC 1/10-, R fem HD cath 1/2-)   Foley:  Yes, and it is still needed Code Status:  full code Last date of multidisciplinary goals of care discussion: Discussed with son POA ALaquonda Shawn11/19/24  App CCT: 23 min  SRichardson LandryMinor ACNP Acute Care Nurse Practitioner LElbaPlease consult ABelton2/11/2022, 11:44 AM    Critical care attending attestation note: I agree with the Advanced Practitioner's note, impression, and recommendations as outlined. I have taken an independent interval history, reviewed the chart and examined the patient. The following reflects my medical decision making and independent critical care time     Synopsis of assessment and plan: 57year old female who was initially admitted with septic shock and respiratory failure in the setting of acute  colitis, noted to have Streptococcus bacteremia, she got better and then course was complicated with lower GI bleeding and acute blood loss anemia due to colonic ulcer Course was further complicated with AKI on CKD stage IIIb due to septic ATN requiring hemodialysis PCCM was reconsulted today because of altered mental status and on workup patient was noted to have severe metabolic acidosis   Physical exam: General: Critically ill-appearing female, orally intubated HEENT: [place]/AT, eyes anicteric.  ETT and OGT in place Neuro: Sedated, not following commands.  Eyes are closed.  Pupils 3 mm bilateral reactive to light Chest: Coarse breath sounds, no wheezes or rhonchi Heart:  Regular rate and rhythm, no murmurs or gallops Abdomen: Soft, nontender, nondistended, bowel sounds present Skin: No rash   Labs and images were reviewed   Assessment and plan: Severe anion gap and non- anion gap metabolic acidosis likely in the setting of GI loss Acute metabolic encephalopathy in the setting of severe acidosis Lower GI bleeding due to colonic ulcer Acute blood loss anemia AKI on CKD stage IIIb due to septic ATN Shock liver   Patient continued to have GI losses with renal failure she is developing severe metabolic acidosis now her pH is 7.14 with bicarbonate less than 7 Will give her 2 A of bicarbonate Will start bicarbonate infusion Avoid deep sedation Monitor H&H, remained stable so far Monitor intake and output, she does make urine Nephrology is following Trend LFTs   This patient is critically ill with multiple organ system failure which requires frequent high complexity decision making, assessment, support, evaluation, and titration of therapies. This was completed through the application of advanced monitoring technologies and extensive interpretation of multiple databases.  During this encounter critical care time was devoted to patient care services described in this note for 35 minutes.     Jacky Kindle, MD [facility] See Amion for pager If no response to pager, please call [phone] until 7pm After 7pm, Please call E-link [phone]     04/19/2022, 12:19 PM

## 2022-04-19 NOTE — Progress Notes (Signed)
PROGRESS NOTE        PATIENT DETAILS Name: Darlene Barber Age: 57 y.o. Sex: female Date of Birth: [birth year] Admit Date: 03/10/2022 Admitting Physician No admitting provider for patient encounter. [id number], Chrystie Nose, MD  Brief Summary:  Patient is a 57 y.o.  female who was initially admitted to the ICU with septic shock-respiratory failure requiring pressors/intubation on initial presentation.  She was subsequently was found to have Streptococcus bacteremia.  Hospital course was complicated by AKI requiring CRRT and acute metabolic encephalopathy/ICU delirium.  She was stabilized and transferred to [facility] on 1/17.  Subsequent hospital course complicated by lower GI bleeding with acute blood loss anemia-requiring transfer back to the ICU.  Ultimately she was found to have large ulcers in her entire colon.  She required multiple units of PRBC transfusion.  She was transferred back to the [facility] service on 1/24.  Significant events: 12/31>> admit to ICU-septic shock-intubated-required pressors stool studies/COVID/RSV/flu negative.  Blood cultures positive for strep 01/3 CRRT 01/5>> TEE done with no evidence of vegetations 01/11>>extubated, transferred from [facility] for iHD 01/17>> transfer to [facility] services 01/18>> developed lower GI bleeding-GI consulted-PRBC transfused 01/18>> transfer back to the ICU 01/20>> colonoscopy with multiple ulcers in colon. 01/24>> transferred to [facility]  Significant studies: 12/30>> CT chest/abdomen/pelvis: Fluid throughout stomach/small bowel/colon-nonspecific 01/05>> CT chest/abdomen/pelvis: Persistent areas of wall thickening along the colon, patchy bilateral ill-defined lung opacities 01/19>> CT abdomen/pelvis: Diarrheal illness with fluid reaching the rectum, colonic wall thickening is improved.  Improving bilateral PNA.  Bilateral nephrolithiasis without obstruction. 01/19>> CT angio GI bleed: Limited evaluation because of oral contrast  within the distal small bowel/throughout colon-active GI bleeding could not be assessed. 01/21>> CT angio GI bleed: No active GI bleeding, multiple infectious/inflammatory colitis involving splenic flexure and ascending colon.  Significant microbiology data: 12/30>> blood culture: Streptococcus Infantarius 12/30>> stool C. difficile negative 12/30>> GI pathogen panel: Negative 01/02>> tracheal aspirate: No organisms 01/03>> blood culture: No growth 01/03>> blood culture: No growth 01/17>> blood culture: No growth  Procedures: 12/31-1/11>> ETT 1/05>> TEE: EF 60-65%-no vegetations 1/20>> colonoscopy: Blood in entire colon, multiple ulcers throughout the colon.  Consults: PCCM GI General surgery Nephrology ID  Subjective:  Patient has been more confused overnight with visual hallucinations, she cannot provide any specific complaints today, but she has not had 4 soft BM overnight as discussed with staff.  Objective: Vitals: Blood pressure 97/71, pulse (!) 102, temperature 97.6 F (36.4 C), temperature source Oral, resp. rate 17, height 4' 10"$  (1.473 m), weight 66.2 kg, SpO2 96 %.   Exam:   Awake Alert, appearing, she is much more confused today Symmetrical Chest wall movement, good air entry bilaterally, tachypneic RRR,No Gallops,Rubs or new Murmurs, No Parasternal Heave +ve B.Sounds, she has diffuse tenderness to palpation, no rebound, no guarding  No Cyanosis, Clubbing or edema, No new Rash or bruise      Assessment/Plan:   Metabolic acidosis -Patient bicarb level<7 this morning, VBG confirms metabolic acidosis with compensatory respiratory alkalosis. -Will start on bicarb drip, amp of bicarb. -Will check lactic acid. -With abdominal pain, will check CT abdomen pelvis without IV contrast given her creatinine of 4 -Being her severe acidosis PCCM already consulted.  Septic shock likely due to GI source, required intubation due to septic shock developed ventilator  associated pneumonia, Streptococcus Infantarius bacteremia   Sepsis physiology has resolved TEE  negative for vegetations Pneumonia has clinically resolved   Lower GI bleeding due to severe colitis with multiple colon ulcers ischemic versus infectious Acute blood loss anemia - Follow CBC trend (8 units PRBC from 1/18-1/21) -CT angio without any localized bleeding -stool studies negative - Underwent colonoscopy with biopsies obtained during colonoscopy showing nonspecific inflammation, GI has signed off but thought that infectious versus ischemic colitis is in differential. Gen.surgery following, CT abdomen from 04/06/2022 noted with continued infectious/inflammatory colitis, case discussed with general surgery, no surgical plans but continue conservative management with bowel rest till abdominal pain much improved,  now tolerating oral Flagyl for her colitis with serial improvement in her symptoms and exam, will continue Flagyl for another 10 days stop date 04/23/2022.  Needs close outpatient follow-up with [facility] within 7 to 10 days of discharge.  Symptoms much improved.  Acute toxic/metabolic encephalopathy ICU delirium Multifactorial etiology-ICU delirium/encephalopathy due to sepsis/AKI Maintain delirium precautions Continue Seroquel 300 nightly Continue vilazodone -With significantly increased confusion and visual hallucinations, her Seroquel has been uptitrated to 400 g nightly. -Patient with much worsening mentation over last 24 hrs., but this could be attributed to her metabolic acidosis  AKI Multifactorial etiology-felt to be ischemic ATN from septic shock-possibly some component of infectious (strep) glomerulonephritis-and now possibly contrast related (CT angio)  Avoid nephrotoxic agents  Shock liver Improved Trend LFTs periodically  HTN BP stable Continue amlodipine  GERD PPI  Bipolar disorder/depression Complicated by  encephalopathy Continue Seroquel/vilazodone  COPD Not in exacerbation-continue bronchodilators  Hypomagnesemia.  Replaced.    OSA CPAP when able  Nutrition Status: Nutrition Problem: Inadequate oral intake Etiology: inability to eat Signs/Symptoms: NPO status (on vent) Interventions: MVI  Obesity: Estimated body mass index is 30.5 kg/m as calculated from the following:   Height as of this encounter: 4' 10"$  (1.473 m).   Weight as of this encounter: 66.2 kg.   Code status:   Code Status: Full Code   DVT Prophylaxis: Place and maintain sequential compression device Start: 03/13/22 2117 SCDs Start: 03/10/22 0232   Family Communication: Discussed with son at bedside 2/8, and by phone today, he was updated.   Disposition Plan: Status is: Inpatient Remains inpatient appropriate because: Severity of illness   Planned Discharge Destination:Skilled nursing facility   Diet: Diet Order             DIET SOFT Room service appropriate? No; Fluid consistency: Thin  Diet effective now                    MEDICATIONS: Scheduled Meds:  (feeding supplement) PROSource Plus  30 mL Oral BID BM   amLODipine  10 mg Oral Daily   Chlorhexidine Gluconate Cloth  6 each Topical Daily   fluticasone furoate-vilanterol  1 puff Inhalation Daily   And   umeclidinium bromide  1 puff Inhalation Daily   lidocaine  1 patch Transdermal Q24H   methocarbamol  1,000 mg Oral Q8H   metroNIDAZOLE  500 mg Oral Q12H   multivitamin  1 tablet Oral QHS   nortriptyline  50 mg Oral QHS   mouth rinse  15 mL Mouth Rinse 4 times per day   pantoprazole  40 mg Oral Daily   QUEtiapine  400 mg Oral QHS   sodium bicarbonate  50 mEq Intravenous Once   sodium bicarbonate  1,300 mg Oral BID   Vilazodone HCl  40 mg Oral Daily   Continuous Infusions:  sodium chloride Stopped (04/12/22 1000)  sodium bicarbonate 150 mEq in dextrose 5 % 1,150 mL infusion 75 mL/hr at 04/19/22 1033   PRN Meds:.sodium chloride,  albuterol, docusate, haloperidol lactate, labetalol, lip balm, LORazepam, mineral oil-hydrophilic petrolatum, ondansetron (ZOFRAN) IV, mouth rinse, oxyCODONE, polyethylene glycol, promethazine, sodium chloride flush   I have personally reviewed following labs and imaging studies  LABORATORY DATA:  Recent Labs  Lab 04/13/22 0306 04/14/22 0322 04/19/22 0515  WBC 7.2 5.4 10.7*  HGB 11.3* 11.1* 11.6*  HCT 33.5* 33.6* 35.3*  PLT 284 320 496*  MCV 90.3 89.8 92.9  MCH 30.5 29.7 30.5  MCHC 33.7 33.0 32.9  RDW 16.5* 16.8* 18.5*  LYMPHSABS 1.8 1.4  --   MONOABS 0.8 0.8  --   EOSABS 0.0 0.1  --   BASOSABS 0.0 0.0  --     Recent Labs  Lab 04/13/22 0306 04/14/22 0322 04/19/22 0515 04/19/22 0904  NA 133* 132* 135 136  K 4.4 4.3 4.6 4.7  CL 107 106 108 107  CO2 15* 14* <7* <7*  ANIONGAP 11 12 NOT CALCULATED NOT CALCULATED  GLUCOSE 78 90 115* 119*  BUN 23* 26* 26* 28*  CREATININE 4.53* 4.41* 4.08* 4.19*  CRP 2.8* 2.4*  --   --   MG 2.4 2.1  --   --   CALCIUM 8.3* 8.3* 9.2 9.5    MICROBIOLOGY: No results found for this or any previous visit (from the past 240 hour(s)).   RADIOLOGY STUDIES/RESULTS: No results found.   LOS: 40 days   Signature  -    Phillips Climes M.D on 04/19/2022 at 11:04 AM   -  To page go to [email or web address]

## 2022-04-19 NOTE — Progress Notes (Signed)
Pt refusing ABG getting very agitated.

## 2022-04-19 NOTE — Progress Notes (Signed)
Attempted to call report x 1. Receiving nurse unable to take report at this time. Left call back number.

## 2022-04-19 NOTE — Progress Notes (Signed)
[facility] Progress Note  Reconsulted for worsening acidosis.  Briefly, 85F with HTN, DM, OSA, bipoalr admitted since 03/09/22 after presenting with septic shock secondary to strep bacteremia with course complicated by AKI (ATN +/- infection assoc GN) requiring CRRT, resp failure requiring intubation, significant GIB.   Nephrology signed off 1/25 after creatinine stabilized in the mid 4s with hope for additional recovery with time.   Creatinine has remained in the mid 4s. Bicarb generally in the high teens to low 20s recently on oral bicarb 650 BID.  Trended down 2/4 to 14 then today < 7.  VBG showing 7.14 / < 18.  Nephrology was consulted.    Subjective:   Pt seen in room.  Had a nightmare and is fixated on that.  Says having watery diarrhea x 3/day for last 2-3 days.  Primary says diarrhea has been an ongoing issue.  She says UOP has been fairly normal in volume and appearance.  UOP yesterday 550.  Otherwise has not been documented.  Objective Vitals:   04/18/22 1530 04/18/22 2014 04/19/22 0929 04/19/22 1035  BP: 114/73 (!) 128/96  97/71  Pulse:  (!) 101  (!) 102  Resp: 20 20  17  $ Temp: 98.1 F (36.7 C) 98.5 F (36.9 C)  97.6 F (36.4 C)  TempSrc: Oral Oral  Oral  SpO2: 100% 96% 96%   Weight:      Height:       Physical Exam General: chronically ill appearing lying in bed in no distress Heart: tachycardic, II/VI SEM Lungs: clear Abdomen: soft, mod distended, nontender, ^ BS Extremities: 1+ edema   Additional Objective Labs: Basic Metabolic Panel: Recent Labs  Lab 04/14/22 0322 04/19/22 0515 04/19/22 0904  NA 132* 135 136  K 4.3 4.6 4.7  CL 106 108 107  CO2 14* <7* <7*  GLUCOSE 90 115* 119*  BUN 26* 26* 28*  CREATININE 4.41* 4.08* 4.19*  CALCIUM 8.3* 9.2 9.5   Liver Function Tests: No results for input(s): "AST", "ALT", "ALKPHOS", "BILITOT", "PROT", "ALBUMIN" in the last 168 hours. No results for input(s): "LIPASE", "AMYLASE" in the last 168  hours. CBC: Recent Labs  Lab 04/13/22 0306 04/14/22 0322 04/19/22 0515  WBC 7.2 5.4 10.7*  NEUTROABS 4.5 3.1  --   HGB 11.3* 11.1* 11.6*  HCT 33.5* 33.6* 35.3*  MCV 90.3 89.8 92.9  PLT 284 320 496*   Blood Culture    Component Value Date/Time   SDES BLOOD LEFT ARM 03/27/2022 2305   SPECREQUEST  03/27/2022 2305    BOTTLES DRAWN AEROBIC AND ANAEROBIC Blood Culture adequate volume   CULT  03/27/2022 2305    NO GROWTH 5 DAYS Performed at [facility], [place] [address], [place], [place] [address]    REPTSTATUS 04/01/2022 FINAL 03/27/2022 2305    Cardiac Enzymes: No results for input(s): "CKTOTAL", "CKMB", "CKMBINDEX", "TROPONINI" in the last 168 hours. CBG: Recent Labs  Lab 04/13/22 1223 04/14/22 0832 04/14/22 1142  GLUCAP 77 107* 93   Iron Studies: No results for input(s): "IRON", "TIBC", "TRANSFERRIN", "FERRITIN" in the last 72 hours. @lablastinr3$ @ Studies/Results: CT ABDOMEN PELVIS WO CONTRAST  Result Date: 04/19/2022 CLINICAL DATA:  Abdominal pain, acute, nonlocalized. Sepsis. History of colitis and renal insufficiency. EXAM: CT ABDOMEN AND PELVIS WITHOUT CONTRAST TECHNIQUE: Multidetector CT imaging of the abdomen and pelvis was performed following the standard protocol without IV contrast. RADIATION DOSE REDUCTION: This exam was performed according to the departmental dose-optimization program which includes automated exposure control, adjustment  of the mA and/or kV according to patient size and/or use of iterative reconstruction technique. COMPARISON:  Noncontrast abdominopelvic CT 04/06/2022. Abdominal CTA 03/31/2022. FINDINGS: Despite efforts by the technologist and patient, mild motion artifact is present on today's exam and could not be eliminated. This reduces exam sensitivity and specificity. Lower chest: Interval mildly improved aeration of the lung bases. No significant pleural or pericardial effusion. Hepatobiliary: The liver appears unremarkable as imaged in  the noncontrast state. No evidence of biliary dilatation status post cholecystectomy. Pancreas: Unremarkable. No pancreatic ductal dilatation or surrounding inflammatory changes. Spleen: Normal in size without focal abnormality. Adrenals/Urinary Tract: Both adrenal glands appear normal. Punctate nonobstructing bilateral renal calculi are again noted. No evidence of ureteral calculus, hydronephrosis or perinephric soft tissue stranding. The bladder appears unremarkable for its degree of distention. Stomach/Bowel: No enteric contrast administered. The stomach appears unremarkable for its degree of distention. No small bowel distension, wall thickening or surrounding inflammation. Colonic assessment limited by the lack of enteric and intravenous contrast. The colon is decompressed and partially fluid filled. Previously demonstrated diffuse colonic wall thickening and surrounding inflammatory changes have improved. The appendix appears normal. No evidence of bowel perforation or obstruction. Vascular/Lymphatic: Mild aortic and branch vessel atherosclerosis. No enlarged abdominopelvic lymph nodes identified. Reproductive: Hysterectomy.  No adnexal mass. Other: Small umbilical hernia containing only fat. As above, interval improved pericolonic inflammatory changes. No ascites, pneumoperitoneum or focal extraluminal fluid collection. Improved edema within the subcutaneous tissues. Musculoskeletal: No acute or significant osseous findings. IMPRESSION: 1. No acute findings or explanation for the patient's symptoms. 2. Interval improvement in previously demonstrated diffuse colonic wall thickening and surrounding inflammatory changes consistent with improving colitis. No evidence of bowel perforation or obstruction. 3. Punctate nonobstructing bilateral renal calculi. 4.  Aortic Atherosclerosis (ICD10-I70.0). Electronically Signed   By: Richardean Sale M.D.   On: 04/19/2022 12:51   Medications:  sodium chloride Stopped  (04/12/22 1000)   sodium bicarbonate 150 mEq in dextrose 5 % 1,150 mL infusion 100 mL/hr at 04/19/22 1304    (feeding supplement) PROSource Plus  30 mL Oral BID BM   amLODipine  10 mg Oral Daily   Chlorhexidine Gluconate Cloth  6 each Topical Daily   fluticasone furoate-vilanterol  1 puff Inhalation Daily   And   umeclidinium bromide  1 puff Inhalation Daily   lidocaine  1 patch Transdermal Q24H   methocarbamol  1,000 mg Oral Q8H   metroNIDAZOLE  500 mg Oral Q12H   multivitamin  1 tablet Oral QHS   nortriptyline  50 mg Oral QHS   mouth rinse  15 mL Mouth Rinse 4 times per day   pantoprazole  40 mg Oral Daily   QUEtiapine  400 mg Oral QHS   Vilazodone HCl  40 mg Oral Daily    Assessment/Plan: **Metabolic acidosis, AG:  lactate pending but suspect related to AKI + ongoing bicarb losses with diarrhea.  Given pH < 7.2 recommended giving 1 amp bicarb + starting bicarb gtt.  Would repeat VBG in a few hours.  If not improving or if she runs into issues with volume not responsive to diuretics preventing continuation of bicarb gtt could do CRRT; would need line reinserted.  Will follow along - I think we can manage this medically.  **AKI, severe, nonoliguric:  protracted course ATN, contrast, possible infection assoc GN-- required CRRT earlier in the admission but has not required for 2+ weeks.  Cr remains in the mid 4s with normal electrolytes.  UOP yesterday was  normal.  As above, may end up needing RRT for acidosis.    **AMS: appears she has been confused on and off this admission; on multiple meds.   **Diarrhea:  h/o IBS, also has been noted to have significant colitis this admission + ulcers -- seems a colectomy was considered but she wasn't a candidate.  Suspect this is contributing to ongoing bicarb losses.  **bipolar **HTN  Will follow - reach out with issues.   Jannifer Hick MD 04/19/2022, 1:34 PM  [facility] Pager: [phone]

## 2022-04-20 LAB — RENAL FUNCTION PANEL
Albumin: 1.6 g/dL — ABNORMAL LOW (ref 3.5–5.0)
Anion gap: 22 — ABNORMAL HIGH (ref 5–15)
BUN: 28 mg/dL — ABNORMAL HIGH (ref 6–20)
CO2: 13 mmol/L — ABNORMAL LOW (ref 22–32)
Calcium: 8.1 mg/dL — ABNORMAL LOW (ref 8.9–10.3)
Chloride: 101 mmol/L (ref 98–111)
Creatinine, Ser: 3.79 mg/dL — ABNORMAL HIGH (ref 0.44–1.00)
GFR, Estimated: 13 mL/min — ABNORMAL LOW (ref >60)
Glucose, Bld: 119 mg/dL — ABNORMAL HIGH (ref 70–99)
Phosphorus: 3.7 mg/dL (ref 2.5–4.6)
Potassium: 3.8 mmol/L (ref 3.5–5.1)
Sodium: 136 mmol/L (ref 135–145)

## 2022-04-20 LAB — BLOOD GAS, VENOUS
Acid-base deficit: 5.4 mmol/L — ABNORMAL HIGH (ref 0.0–2.0)
Bicarbonate: 17 mmol/L — ABNORMAL LOW (ref 20.0–28.0)
O2 Saturation: 79 %
Patient temperature: 37
pCO2, Ven: 25 mmHg — ABNORMAL LOW (ref 44–60)
pH, Ven: 7.44 — ABNORMAL HIGH (ref 7.25–7.43)
pO2, Ven: 44 mmHg (ref 32–45)

## 2022-04-20 LAB — CBC
HCT: 26.7 % — ABNORMAL LOW (ref 36.0–46.0)
Hemoglobin: 9.4 g/dL — ABNORMAL LOW (ref 12.0–15.0)
MCH: 30.9 pg (ref 26.0–34.0)
MCHC: 35.2 g/dL (ref 30.0–36.0)
MCV: 87.8 fL (ref 80.0–100.0)
Platelets: 464 10*3/uL — ABNORMAL HIGH (ref 150–400)
RBC: 3.04 MIL/uL — ABNORMAL LOW (ref 3.87–5.11)
RDW: 18.1 % — ABNORMAL HIGH (ref 11.5–15.5)
WBC: 8.8 10*3/uL (ref 4.0–10.5)
nRBC: 0 % (ref 0.0–0.2)

## 2022-04-20 MED ORDER — POTASSIUM CHLORIDE CRYS ER 20 MEQ PO TBCR
20.0000 meq | EXTENDED_RELEASE_TABLET | Freq: Once | ORAL | Status: AC
  Administered 2022-04-20: 20 meq via ORAL
  Filled 2022-04-20: qty 1

## 2022-04-20 MED ORDER — HEPARIN SODIUM (PORCINE) 5000 UNIT/ML IJ SOLN
5000.0000 [IU] | Freq: Three times a day (TID) | INTRAMUSCULAR | Status: DC
  Administered 2022-04-20 – 2022-04-22 (×6): 5000 [IU] via SUBCUTANEOUS
  Filled 2022-04-20 (×6): qty 1

## 2022-04-20 MED ORDER — SODIUM BICARBONATE 650 MG PO TABS
1300.0000 mg | ORAL_TABLET | Freq: Three times a day (TID) | ORAL | Status: DC
  Administered 2022-04-20 – 2022-04-21 (×4): 1300 mg via ORAL
  Filled 2022-04-20 (×3): qty 2

## 2022-04-20 MED ORDER — SODIUM BICARBONATE 8.4 % IV SOLN
50.0000 meq | Freq: Once | INTRAVENOUS | Status: AC
  Administered 2022-04-20: 50 meq via INTRAVENOUS
  Filled 2022-04-20: qty 50

## 2022-04-20 NOTE — Progress Notes (Signed)
[facility] Progress Note  Reconsulted 2/9 for worsening acidosis.  Briefly, 4F with HTN, DM, OSA, bipoalr admitted since 03/09/22 after presenting with septic shock secondary to strep bacteremia with course complicated by AKI (ATN +/- infection assoc GN) requiring CRRT, resp failure requiring intubation, significant GIB.   Nephrology signed off 1/25 after creatinine stabilized in the mid 4s with hope for additional recovery with time.   Creatinine has remained in the mid 4s. Bicarb generally in the high teens to low 20s recently on oral bicarb 650 BID.  Trended down 2/4 to 14 then today < 7.  VBG showing 7.14 / < 18.  Nephrology was consulted.    Subjective:   moved to ICU yest given degree of acidosis.  Labs this AM improved.  Crying that she is unable to go home today.  Less diarrhea.   Objective Vitals:   04/20/22 1000 04/20/22 1100 04/20/22 1122 04/20/22 1200  BP: 127/88 105/73  97/64  Pulse: (!) 108 (!) 111  (!) 104  Resp: 15 (!) 27  16  Temp:   98.4 F (36.9 C)   TempSrc:   Oral   SpO2: 100% 99%  96%  Weight:      Height:       Physical Exam General: chronically ill appearing lying in bed in no distress Heart: tachycardic, II/VI SEM Lungs: clear Abdomen: soft, mod distended, nontender Extremities: 1+ edema   Additional Objective Labs: Basic Metabolic Panel: Recent Labs  Lab 04/19/22 0515 04/19/22 0904 04/20/22 0211  NA 135 136 136  K 4.6 4.7 3.8  CL 108 107 101  CO2 <7* <7* 13*  GLUCOSE 115* 119* 119*  BUN 26* 28* 28*  CREATININE 4.08* 4.19* 3.79*  CALCIUM 9.2 9.5 8.1*  PHOS  --   --  3.7    Liver Function Tests: Recent Labs  Lab 04/20/22 0211  ALBUMIN 1.6*   No results for input(s): "LIPASE", "AMYLASE" in the last 168 hours. CBC: Recent Labs  Lab 04/14/22 0322 04/19/22 0515 04/20/22 0210  WBC 5.4 10.7* 8.8  NEUTROABS 3.1  --   --   HGB 11.1* 11.6* 9.4*  HCT 33.6* 35.3* 26.7*  MCV 89.8 92.9 87.8  PLT 320 496* 464*    Blood  Culture    Component Value Date/Time   SDES BLOOD LEFT ARM 03/27/2022 2305   SPECREQUEST  03/27/2022 2305    BOTTLES DRAWN AEROBIC AND ANAEROBIC Blood Culture adequate volume   CULT  03/27/2022 2305    NO GROWTH 5 DAYS Performed at [facility], [place] [address], [place], [place] [address]    REPTSTATUS 04/01/2022 FINAL 03/27/2022 2305    Cardiac Enzymes: No results for input(s): "CKTOTAL", "CKMB", "CKMBINDEX", "TROPONINI" in the last 168 hours. CBG: Recent Labs  Lab 04/14/22 0832 04/14/22 1142 04/19/22 1517  GLUCAP 107* 93 96    Iron Studies: No results for input(s): "IRON", "TIBC", "TRANSFERRIN", "FERRITIN" in the last 72 hours. @lablastinr3$ @ Studies/Results: CT ABDOMEN PELVIS WO CONTRAST  Result Date: 04/19/2022 CLINICAL DATA:  Abdominal pain, acute, nonlocalized. Sepsis. History of colitis and renal insufficiency. EXAM: CT ABDOMEN AND PELVIS WITHOUT CONTRAST TECHNIQUE: Multidetector CT imaging of the abdomen and pelvis was performed following the standard protocol without IV contrast. RADIATION DOSE REDUCTION: This exam was performed according to the departmental dose-optimization program which includes automated exposure control, adjustment of the mA and/or kV according to patient size and/or use of iterative reconstruction technique. COMPARISON:  Noncontrast abdominopelvic CT 04/06/2022. Abdominal CTA 03/31/2022.  FINDINGS: Despite efforts by the technologist and patient, mild motion artifact is present on today's exam and could not be eliminated. This reduces exam sensitivity and specificity. Lower chest: Interval mildly improved aeration of the lung bases. No significant pleural or pericardial effusion. Hepatobiliary: The liver appears unremarkable as imaged in the noncontrast state. No evidence of biliary dilatation status post cholecystectomy. Pancreas: Unremarkable. No pancreatic ductal dilatation or surrounding inflammatory changes. Spleen: Normal in size without focal  abnormality. Adrenals/Urinary Tract: Both adrenal glands appear normal. Punctate nonobstructing bilateral renal calculi are again noted. No evidence of ureteral calculus, hydronephrosis or perinephric soft tissue stranding. The bladder appears unremarkable for its degree of distention. Stomach/Bowel: No enteric contrast administered. The stomach appears unremarkable for its degree of distention. No small bowel distension, wall thickening or surrounding inflammation. Colonic assessment limited by the lack of enteric and intravenous contrast. The colon is decompressed and partially fluid filled. Previously demonstrated diffuse colonic wall thickening and surrounding inflammatory changes have improved. The appendix appears normal. No evidence of bowel perforation or obstruction. Vascular/Lymphatic: Mild aortic and branch vessel atherosclerosis. No enlarged abdominopelvic lymph nodes identified. Reproductive: Hysterectomy.  No adnexal mass. Other: Small umbilical hernia containing only fat. As above, interval improved pericolonic inflammatory changes. No ascites, pneumoperitoneum or focal extraluminal fluid collection. Improved edema within the subcutaneous tissues. Musculoskeletal: No acute or significant osseous findings. IMPRESSION: 1. No acute findings or explanation for the patient's symptoms. 2. Interval improvement in previously demonstrated diffuse colonic wall thickening and surrounding inflammatory changes consistent with improving colitis. No evidence of bowel perforation or obstruction. 3. Punctate nonobstructing bilateral renal calculi. 4.  Aortic Atherosclerosis (ICD10-I70.0). Electronically Signed   By: Richardean Sale M.D.   On: 04/19/2022 12:51   Medications:  sodium chloride Stopped (04/12/22 1000)   sodium bicarbonate 150 mEq in dextrose 5 % 1,150 mL infusion 100 mL/hr at 04/20/22 1200    (feeding supplement) PROSource Plus  30 mL Oral BID BM   amLODipine  10 mg Oral Daily   Chlorhexidine  Gluconate Cloth  6 each Topical Daily   fluticasone furoate-vilanterol  1 puff Inhalation Daily   And   umeclidinium bromide  1 puff Inhalation Daily   heparin injection (subcutaneous)  5,000 Units Subcutaneous Q8H   lidocaine  1 patch Transdermal Q24H   methocarbamol  1,000 mg Oral Q8H   metroNIDAZOLE  500 mg Oral Q12H   multivitamin  1 tablet Oral QHS   nortriptyline  50 mg Oral QHS   mouth rinse  15 mL Mouth Rinse 4 times per day   pantoprazole  40 mg Oral Daily   QUEtiapine  400 mg Oral QHS   sodium bicarbonate  1,300 mg Oral TID   Vilazodone HCl  40 mg Oral Daily    Assessment/Plan: **Metabolic acidosis:  VBG yest 7.09 / < 18, serum bicarb < 7.  Aggressive IV bicarb repletion.  Labs this AM 7.44 / 25, serum bicarb 13.  Cont bicarb gtt today but add oral bicarb and plan to take gtt off tomorrow.  Combo AKI + diarrhea.   **AKI, severe, nonoliguric:  protracted course ATN, contrast, possible infection assoc GN-- required CRRT earlier in the admission but has not required for 2+ weeks.  Cr has been in the mid 4s with normal electrolytes > 3.8 today.  UOP has been normal.    **AMS: appears she has been confused on and off this admission; on multiple meds.   **Diarrhea:  h/o IBS, also has been noted to have  significant colitis this admission + ulcers -- seems a colectomy was considered but she wasn't a candidate.  Suspect this is contributing to ongoing bicarb losses. - says improved today  **bipolar **HTN  Will follow - reach out with issues.   Jannifer Hick MD 04/20/2022, 2:10 PM  [facility] Pager: [phone]

## 2022-04-20 NOTE — Progress Notes (Signed)
PROGRESS NOTE        PATIENT DETAILS Name: Darlene Barber Age: 57 y.o. Sex: female Date of Birth: [birth year] Admit Date: 03/10/2022 Admitting Physician Jacky Kindle, MD [id number], Chrystie Nose, MD  Brief Summary:  Patient is a 57 y.o.  female who was initially admitted to the ICU with septic shock-respiratory failure requiring pressors/intubation on initial presentation.  She was subsequently was found to have Streptococcus bacteremia.  Hospital course was complicated by AKI requiring CRRT and acute metabolic encephalopathy/ICU delirium.  She was stabilized and transferred to [facility] on 1/17.  Subsequent hospital course complicated by lower GI bleeding with acute blood loss anemia-requiring transfer back to the ICU.  Ultimately she was found to have large ulcers in her entire colon.  She required multiple units of PRBC transfusion.  She was transferred back to the [facility] service on 1/24.  Significant events: 12/31>> admit to ICU-septic shock-intubated-required pressors stool studies/COVID/RSV/flu negative.  Blood cultures positive for strep 01/3 CRRT 01/5>> TEE done with no evidence of vegetations 01/11>>extubated, transferred from [facility] for iHD 01/17>> transfer to [facility] services 01/18>> developed lower GI bleeding-GI consulted-PRBC transfused 01/18>> transfer back to the ICU 01/20>> colonoscopy with multiple ulcers in colon. 01/24>> transferred to [facility] 02/9 >> stricter ICU for severe metabolic acidosis  Significant studies: 12/30>> CT chest/abdomen/pelvis: Fluid throughout stomach/small bowel/colon-nonspecific 01/05>> CT chest/abdomen/pelvis: Persistent areas of wall thickening along the colon, patchy bilateral ill-defined lung opacities 01/19>> CT abdomen/pelvis: Diarrheal illness with fluid reaching the rectum, colonic wall thickening is improved.  Improving bilateral PNA.  Bilateral nephrolithiasis without obstruction. 01/19>> CT angio GI bleed: Limited evaluation  because of oral contrast within the distal small bowel/throughout colon-active GI bleeding could not be assessed. 01/21>> CT angio GI bleed: No active GI bleeding, multiple infectious/inflammatory colitis involving splenic flexure and ascending colon.  Significant microbiology data: 12/30>> blood culture: Streptococcus Infantarius 12/30>> stool C. difficile negative 12/30>> GI pathogen panel: Negative 01/02>> tracheal aspirate: No organisms 01/03>> blood culture: No growth 01/03>> blood culture: No growth 01/17>> blood culture: No growth  Procedures: 12/31-1/11>> ETT 1/05>> TEE: EF 60-65%-no vegetations 1/20>> colonoscopy: Blood in entire colon, multiple ulcers throughout the colon.  Consults: PCCM GI General surgery Nephrology ID  Subjective:  No significant events overnight, as discussed with staff patient had 2 soft bowel movements overnight .send remains confused, but she denies any complaints today.    Objective: Vitals: Blood pressure (!) 119/90, pulse (!) 121, temperature 98.4 F (36.9 C), temperature source Oral, resp. rate (!) 23, height 4' 10"$  (1.473 m), weight 65.2 kg, SpO2 99 %.   Exam:   Awake, alert, no apparent distress, remains mildly confused, frail, ill-appearing Symmetrical Chest wall movement, Good air movement bilaterally, CTAB RRR,No Gallops,Rubs or new Murmurs, No Parasternal Heave +ve B.Sounds, Abd Soft, No tenderness, No rebound - guarding or rigidity. No Cyanosis, Clubbing or edema, No new Rash or bruise       Assessment/Plan:   Severe metabolic acidosis -Patient bicarb level<7 this morning, VBG confirms metabolic acidosis with compensatory respiratory alkalosis. -On bicarb drip,  - Lactic acid within normal limit -CT abdomen fullwidth with no acute findings  Septic shock likely due to GI source, required intubation due to septic shock developed ventilator associated pneumonia, Streptococcus Infantarius bacteremia   Sepsis physiology  has resolved TEE negative for vegetations Pneumonia has clinically resolved   Lower GI bleeding  due to severe colitis with multiple colon ulcers ischemic versus infectious Acute blood loss anemia - Follow CBC trend (8 units PRBC from 1/18-1/21) -CT angio without any localized bleeding -stool studies negative - Underwent colonoscopy with biopsies obtained during colonoscopy showing nonspecific inflammation, GI has signed off but thought that infectious versus ischemic colitis is in differential. Gen.surgery following, CT abdomen from 04/06/2022 noted with continued infectious/inflammatory colitis, case discussed with general surgery, no surgical plans but continue conservative management with bowel rest till abdominal pain much improved,  now tolerating oral Flagyl for her colitis with serial improvement in her symptoms and exam, will continue Flagyl for another 10 days stop date 04/23/2022.  Needs close outpatient follow-up with [facility] and [place] [facility] within 7 to 10 days of discharge.  Symptoms much improved.  Acute toxic/metabolic encephalopathy ICU delirium Multifactorial etiology-ICU delirium/encephalopathy due to sepsis/AKI Maintain delirium precautions Continue Seroquel 300 nightly Continue vilazodone -With significantly increased confusion and visual hallucinations, her Seroquel has been uptitrated to 400 g nightly. -Patient with much worsening mentation over last 24 hrs., but this could be attributed to her metabolic acidosis  AKI Multifactorial etiology-felt to be ischemic ATN from septic shock-possibly some component of infectious (strep) glomerulonephritis-and now possibly contrast related (CT angio)  Avoid nephrotoxic agents  Shock liver Improved Trend LFTs periodically  HTN BP stable Continue amlodipine  GERD PPI  Bipolar disorder/depression Complicated by encephalopathy Continue Seroquel/vilazodone  COPD Not in exacerbation-continue  bronchodilators  Hypomagnesemia.  Replaced.    OSA CPAP when able  Nutrition Status: Nutrition Problem: Inadequate oral intake Etiology: inability to eat Signs/Symptoms: NPO status (on vent) Interventions: MVI  Obesity: Estimated body mass index is 30.04 kg/m as calculated from the following:   Height as of this encounter: 4' 10"$  (1.473 m).   Weight as of this encounter: 65.2 kg.   Code status:   Code Status: Full Code   DVT Prophylaxis: Place and maintain sequential compression device Start: 03/13/22 2117 SCDs Start: 03/10/22 0232   Family Communication: None at bedside  Disposition Plan: Status is: Inpatient Remains inpatient appropriate because: Severity of illness   Planned Discharge Destination:Skilled nursing facility   Diet: Diet Order             DIET SOFT Room service appropriate? No; Fluid consistency: Thin  Diet effective now                    MEDICATIONS: Scheduled Meds:  (feeding supplement) PROSource Plus  30 mL Oral BID BM   amLODipine  10 mg Oral Daily   Chlorhexidine Gluconate Cloth  6 each Topical Daily   fluticasone furoate-vilanterol  1 puff Inhalation Daily   And   umeclidinium bromide  1 puff Inhalation Daily   lidocaine  1 patch Transdermal Q24H   methocarbamol  1,000 mg Oral Q8H   metroNIDAZOLE  500 mg Oral Q12H   multivitamin  1 tablet Oral QHS   nortriptyline  50 mg Oral QHS   mouth rinse  15 mL Mouth Rinse 4 times per day   pantoprazole  40 mg Oral Daily   QUEtiapine  400 mg Oral QHS   sodium bicarbonate  1,300 mg Oral TID   Vilazodone HCl  40 mg Oral Daily   Continuous Infusions:  sodium chloride Stopped (04/12/22 1000)   sodium bicarbonate 150 mEq in dextrose 5 % 1,150 mL infusion 100 mL/hr at 04/20/22 0900   PRN Meds:.sodium chloride, albuterol, docusate, haloperidol lactate, labetalol, lip balm, LORazepam, mineral  oil-hydrophilic petrolatum, ondansetron (ZOFRAN) IV, mouth rinse, oxyCODONE, polyethylene glycol,  promethazine, sodium chloride flush   I have personally reviewed following labs and imaging studies  LABORATORY DATA:  Recent Labs  Lab 04/14/22 0322 04/19/22 0515 04/20/22 0210  WBC 5.4 10.7* 8.8  HGB 11.1* 11.6* 9.4*  HCT 33.6* 35.3* 26.7*  PLT 320 496* 464*  MCV 89.8 92.9 87.8  MCH 29.7 30.5 30.9  MCHC 33.0 32.9 35.2  RDW 16.8* 18.5* 18.1*  LYMPHSABS 1.4  --   --   MONOABS 0.8  --   --   EOSABS 0.1  --   --   BASOSABS 0.0  --   --     Recent Labs  Lab 04/14/22 0322 04/19/22 0515 04/19/22 0904 04/19/22 1217 04/19/22 1538 04/20/22 0211  NA 132* 135 136  --   --  136  K 4.3 4.6 4.7  --   --  3.8  CL 106 108 107  --   --  101  CO2 14* <7* <7*  --   --  13*  ANIONGAP 12 NOT CALCULATED NOT CALCULATED  --   --  22*  GLUCOSE 90 115* 119*  --   --  119*  BUN 26* 26* 28*  --   --  28*  CREATININE 4.41* 4.08* 4.19*  --   --  3.79*  ALBUMIN  --   --   --   --   --  1.6*  CRP 2.4*  --   --   --   --   --   LATICACIDVEN  --   --   --  0.8 1.0  --   MG 2.1  --   --   --   --   --   CALCIUM 8.3* 9.2 9.5  --   --  8.1*    MICROBIOLOGY: No results found for this or any previous visit (from the past 240 hour(s)).   RADIOLOGY STUDIES/RESULTS: CT ABDOMEN PELVIS WO CONTRAST  Result Date: 04/19/2022 CLINICAL DATA:  Abdominal pain, acute, nonlocalized. Sepsis. History of colitis and renal insufficiency. EXAM: CT ABDOMEN AND PELVIS WITHOUT CONTRAST TECHNIQUE: Multidetector CT imaging of the abdomen and pelvis was performed following the standard protocol without IV contrast. RADIATION DOSE REDUCTION: This exam was performed according to the departmental dose-optimization program which includes automated exposure control, adjustment of the mA and/or kV according to patient size and/or use of iterative reconstruction technique. COMPARISON:  Noncontrast abdominopelvic CT 04/06/2022. Abdominal CTA 03/31/2022. FINDINGS: Despite efforts by the technologist and patient, mild motion artifact  is present on today's exam and could not be eliminated. This reduces exam sensitivity and specificity. Lower chest: Interval mildly improved aeration of the lung bases. No significant pleural or pericardial effusion. Hepatobiliary: The liver appears unremarkable as imaged in the noncontrast state. No evidence of biliary dilatation status post cholecystectomy. Pancreas: Unremarkable. No pancreatic ductal dilatation or surrounding inflammatory changes. Spleen: Normal in size without focal abnormality. Adrenals/Urinary Tract: Both adrenal glands appear normal. Punctate nonobstructing bilateral renal calculi are again noted. No evidence of ureteral calculus, hydronephrosis or perinephric soft tissue stranding. The bladder appears unremarkable for its degree of distention. Stomach/Bowel: No enteric contrast administered. The stomach appears unremarkable for its degree of distention. No small bowel distension, wall thickening or surrounding inflammation. Colonic assessment limited by the lack of enteric and intravenous contrast. The colon is decompressed and partially fluid filled. Previously demonstrated diffuse colonic wall thickening and surrounding inflammatory changes have improved. The appendix appears  normal. No evidence of bowel perforation or obstruction. Vascular/Lymphatic: Mild aortic and branch vessel atherosclerosis. No enlarged abdominopelvic lymph nodes identified. Reproductive: Hysterectomy.  No adnexal mass. Other: Small umbilical hernia containing only fat. As above, interval improved pericolonic inflammatory changes. No ascites, pneumoperitoneum or focal extraluminal fluid collection. Improved edema within the subcutaneous tissues. Musculoskeletal: No acute or significant osseous findings. IMPRESSION: 1. No acute findings or explanation for the patient's symptoms. 2. Interval improvement in previously demonstrated diffuse colonic wall thickening and surrounding inflammatory changes consistent with  improving colitis. No evidence of bowel perforation or obstruction. 3. Punctate nonobstructing bilateral renal calculi. 4.  Aortic Atherosclerosis (ICD10-I70.0). Electronically Signed   By: Richardean Sale M.D.   On: 04/19/2022 12:51     LOS: 68 days   Signature  -    Phillips Climes M.D on 04/20/2022 at 11:44 AM   -  To page go to [email or web address]

## 2022-04-21 LAB — CBC
HCT: 24.7 % — ABNORMAL LOW (ref 36.0–46.0)
Hemoglobin: 8.7 g/dL — ABNORMAL LOW (ref 12.0–15.0)
MCH: 31.5 pg (ref 26.0–34.0)
MCHC: 35.2 g/dL (ref 30.0–36.0)
MCV: 89.5 fL (ref 80.0–100.0)
Platelets: 447 10*3/uL — ABNORMAL HIGH (ref 150–400)
RBC: 2.76 MIL/uL — ABNORMAL LOW (ref 3.87–5.11)
RDW: 18.2 % — ABNORMAL HIGH (ref 11.5–15.5)
WBC: 8.1 10*3/uL (ref 4.0–10.5)
nRBC: 0 % (ref 0.0–0.2)

## 2022-04-21 LAB — RENAL FUNCTION PANEL
Albumin: <1.5 g/dL — ABNORMAL LOW (ref 3.5–5.0)
Anion gap: 20 — ABNORMAL HIGH (ref 5–15)
BUN: 27 mg/dL — ABNORMAL HIGH (ref 6–20)
CO2: 21 mmol/L — ABNORMAL LOW (ref 22–32)
Calcium: 7.6 mg/dL — ABNORMAL LOW (ref 8.9–10.3)
Chloride: 96 mmol/L — ABNORMAL LOW (ref 98–111)
Creatinine, Ser: 3.24 mg/dL — ABNORMAL HIGH (ref 0.44–1.00)
GFR, Estimated: 16 mL/min — ABNORMAL LOW (ref >60)
Glucose, Bld: 121 mg/dL — ABNORMAL HIGH (ref 70–99)
Phosphorus: 3.4 mg/dL (ref 2.5–4.6)
Potassium: 3.5 mmol/L (ref 3.5–5.1)
Sodium: 137 mmol/L (ref 135–145)

## 2022-04-21 MED ORDER — LOPERAMIDE HCL 2 MG PO CAPS
2.0000 mg | ORAL_CAPSULE | ORAL | Status: DC | PRN
  Administered 2022-04-22: 2 mg via ORAL
  Filled 2022-04-21: qty 1

## 2022-04-21 MED ORDER — LACTATED RINGERS IV SOLN: INTRAVENOUS | Status: DC

## 2022-04-21 MED ORDER — LACTATED RINGERS IV SOLN: INTRAVENOUS | Status: AC

## 2022-04-21 MED ORDER — ALBUMIN HUMAN 25 % IV SOLN
25.0000 g | Freq: Four times a day (QID) | INTRAVENOUS | Status: AC
  Administered 2022-04-21 – 2022-04-22 (×3): 25 g via INTRAVENOUS
  Filled 2022-04-21 (×4): qty 100

## 2022-04-21 MED ORDER — POTASSIUM CHLORIDE CRYS ER 20 MEQ PO TBCR
40.0000 meq | EXTENDED_RELEASE_TABLET | Freq: Once | ORAL | Status: AC
  Administered 2022-04-21: 40 meq via ORAL
  Filled 2022-04-21: qty 2

## 2022-04-21 MED ORDER — HEPARIN SODIUM (PORCINE) 1000 UNIT/ML DIALYSIS: 2000.0000 [IU] | INTRAMUSCULAR | Status: DC | PRN

## 2022-04-21 MED ORDER — SODIUM BICARBONATE 650 MG PO TABS
650.0000 mg | ORAL_TABLET | Freq: Three times a day (TID) | ORAL | Status: DC
  Administered 2022-04-21 – 2022-04-23 (×6): 650 mg via ORAL
  Filled 2022-04-21 (×6): qty 1

## 2022-04-21 MED ORDER — SODIUM BICARBONATE 650 MG PO TABS: 1950.0000 mg | ORAL_TABLET | Freq: Three times a day (TID) | ORAL | Status: DC

## 2022-04-21 NOTE — Progress Notes (Signed)
Darlene Barber Progress Note  Reconsulted 2/9 for worsening acidosis.  Briefly, 47F with HTN, DM, OSA, bipoalr admitted since 03/09/22 after presenting with septic shock secondary to strep bacteremia with course complicated by AKI (ATN +/- infection assoc GN) requiring CRRT, resp failure requiring intubation, significant GIB.   Nephrology signed off 1/25 after creatinine stabilized in the mid 4s with hope for additional recovery with time.   Creatinine has remained in the mid 4s. Bicarb generally in the high teens to low 20s recently on oral bicarb 650 BID.  Trended down 2/4 to 14 then today < 7.  VBG showing 7.14 / < 18.  Nephrology was consulted.    Subjective:   I/Os yest 2.3 / 0.6L (all UOP). No new symptoms - 3 BMs yesterday, describes smaller volume.  No orthopnea, edema.    Objective Vitals:   04/21/22 0700 04/21/22 0738 04/21/22 0800 04/21/22 1116  BP: 109/69  (!) 87/39   Pulse: (!) 105  (!) 116   Resp: 14  (!) 22   Temp:  98.1 F (36.7 C)  98.3 F (36.8 C)  TempSrc:  Oral  Oral  SpO2: 98%  99%   Weight:      Height:       Physical Exam General: chronically ill appearing lying in bed in no distress Heart: tachycardic, II/VI SEM Lungs: clear Abdomen: soft, mildly distended, nontender Extremities: 1+ edema   Additional Objective Labs: Basic Metabolic Panel: Recent Labs  Lab 04/19/22 0904 04/20/22 0211 04/21/22 0044  NA 136 136 137  K 4.7 3.8 3.5  CL 107 101 96*  CO2 <7* 13* 21*  GLUCOSE 119* 119* 121*  BUN 28* 28* 27*  CREATININE 4.19* 3.79* 3.24*  CALCIUM 9.5 8.1* 7.6*  PHOS  --  3.7 3.4    Liver Function Tests: Recent Labs  Lab 04/20/22 0211 04/21/22 0044  ALBUMIN 1.6* <1.5*    No results for input(s): "LIPASE", "AMYLASE" in the last 168 hours. CBC: Recent Labs  Lab 04/19/22 0515 04/20/22 0210 04/21/22 0044  WBC 10.7* 8.8 8.1  HGB 11.6* 9.4* 8.7*  HCT 35.3* 26.7* 24.7*  MCV 92.9 87.8 89.5  PLT 496* 464* 447*    Blood  Culture    Component Value Date/Time   SDES BLOOD LEFT ARM 03/27/2022 2305   SPECREQUEST  03/27/2022 2305    BOTTLES DRAWN AEROBIC AND ANAEROBIC Blood Culture adequate volume   CULT  03/27/2022 2305    NO GROWTH 5 DAYS Performed at [facility], [place] [address] [place],  [address]    REPTSTATUS 04/01/2022 FINAL 03/27/2022 2305    Cardiac Enzymes: No results for input(s): "CKTOTAL", "CKMB", "CKMBINDEX", "TROPONINI" in the last 168 hours. CBG: Recent Labs  Lab 04/14/22 1142 04/19/22 1517  GLUCAP 93 96    Iron Studies: No results for input(s): "IRON", "TIBC", "TRANSFERRIN", "FERRITIN" in the last 72 hours. @lablastinr3$ @ Studies/Results: CT ABDOMEN PELVIS WO CONTRAST  Result Date: 04/19/2022 CLINICAL DATA:  Abdominal pain, acute, nonlocalized. Sepsis. History of colitis and renal insufficiency. EXAM: CT ABDOMEN AND PELVIS WITHOUT CONTRAST TECHNIQUE: Multidetector CT imaging of the abdomen and pelvis was performed following the standard protocol without IV contrast. RADIATION DOSE REDUCTION: This exam was performed according to the departmental dose-optimization program which includes automated exposure control, adjustment of the mA and/or kV according to patient size and/or use of iterative reconstruction technique. COMPARISON:  Noncontrast abdominopelvic CT 04/06/2022. Abdominal CTA 03/31/2022. FINDINGS: Despite efforts by the technologist and patient, mild motion artifact  is present on today's exam and could not be eliminated. This reduces exam sensitivity and specificity. Lower chest: Interval mildly improved aeration of the lung bases. No significant pleural or pericardial effusion. Hepatobiliary: The liver appears unremarkable as imaged in the noncontrast state. No evidence of biliary dilatation status post cholecystectomy. Pancreas: Unremarkable. No pancreatic ductal dilatation or surrounding inflammatory changes. Spleen: Normal in size without focal abnormality.  Adrenals/Urinary Tract: Both adrenal glands appear normal. Punctate nonobstructing bilateral renal calculi are again noted. No evidence of ureteral calculus, hydronephrosis or perinephric soft tissue stranding. The bladder appears unremarkable for its degree of distention. Stomach/Bowel: No enteric contrast administered. The stomach appears unremarkable for its degree of distention. No small bowel distension, wall thickening or surrounding inflammation. Colonic assessment limited by the lack of enteric and intravenous contrast. The colon is decompressed and partially fluid filled. Previously demonstrated diffuse colonic wall thickening and surrounding inflammatory changes have improved. The appendix appears normal. No evidence of bowel perforation or obstruction. Vascular/Lymphatic: Mild aortic and branch vessel atherosclerosis. No enlarged abdominopelvic lymph nodes identified. Reproductive: Hysterectomy.  No adnexal mass. Other: Small umbilical hernia containing only fat. As above, interval improved pericolonic inflammatory changes. No ascites, pneumoperitoneum or focal extraluminal fluid collection. Improved edema within the subcutaneous tissues. Musculoskeletal: No acute or significant osseous findings. IMPRESSION: 1. No acute findings or explanation for the patient's symptoms. 2. Interval improvement in previously demonstrated diffuse colonic wall thickening and surrounding inflammatory changes consistent with improving colitis. No evidence of bowel perforation or obstruction. 3. Punctate nonobstructing bilateral renal calculi. 4.  Aortic Atherosclerosis (ICD10-I70.0). Electronically Signed   By: Richardean Sale M.D.   On: 04/19/2022 12:51   Medications:  sodium chloride Stopped (04/12/22 1000)    (feeding supplement) PROSource Plus  30 mL Oral BID BM   amLODipine  10 mg Oral Daily   Chlorhexidine Gluconate Cloth  6 each Topical Daily   fluticasone furoate-vilanterol  1 puff Inhalation Daily   And    umeclidinium bromide  1 puff Inhalation Daily   heparin injection (subcutaneous)  5,000 Units Subcutaneous Q8H   lidocaine  1 patch Transdermal Q24H   methocarbamol  1,000 mg Oral Q8H   metroNIDAZOLE  500 mg Oral Q12H   multivitamin  1 tablet Oral QHS   nortriptyline  50 mg Oral QHS   mouth rinse  15 mL Mouth Rinse 4 times per day   pantoprazole  40 mg Oral Daily   QUEtiapine  400 mg Oral QHS   sodium bicarbonate  1,300 mg Oral TID   Vilazodone HCl  40 mg Oral Daily    Assessment/Plan: **Metabolic acidosis:  VBG 2/9 - 7.09 / < 18, serum bicarb < 7.  Aggressive IV bicarb repletion.  Labs 2/10 AM 7.44 / 25, serum bicarb 13. This AM serum bicarb now 21 - d/c bicarb gtt and continue oral bicarb. Combo AKI + diarrhea.    **AKI, severe, nonoliguric:  protracted course ATN, contrast, possible infection assoc GN-- required CRRT earlier in the admission but has not required for 2+ weeks.  Cr has been in the mid 4s with normal electrolytes > improved to 3.2 today.  UOP has been normal.  Appears she's in recovery phase.  Would avoid hypovolemia, nephrotoxins and hypotension.   **AMS: appears she has been confused on and off this admission; on multiple meds.   **Diarrhea:  h/o IBS, also has been noted to have significant colitis this admission + ulcers -- seems a colectomy was considered but she wasn't a  candidate.  Suspect this is contributing to ongoing bicarb losses. - says improved today  **bipolar **HTN  Nothing further to add, will sign off.  Let us know of issues we can help with.   Jannifer Hick MD 04/21/2022, 11:21 AM  [facility] Pager: [phone]

## 2022-04-21 NOTE — Progress Notes (Signed)
PROGRESS NOTE        PATIENT DETAILS Name: Darlene Barber Age: 57 y.o. Sex: female Date of Birth: 01/02/1966 Admit Date: 03/10/2022 Admitting Physician Jacky Kindle, MD [id number], Chrystie Nose, MD  Brief Summary:  Patient is a 57 y.o.  female who was initially admitted to the ICU with septic shock-respiratory failure requiring pressors/intubation on initial presentation.  She was subsequently was found to have Streptococcus bacteremia.  Hospital course was complicated by AKI requiring CRRT and acute metabolic encephalopathy/ICU delirium.  She was stabilized and transferred to [facility] on 1/17.  Subsequent hospital course complicated by lower GI bleeding with acute blood loss anemia-requiring transfer back to the ICU.  Ultimately she was found to have large ulcers in her entire colon.  She required multiple units of PRBC transfusion.  She was transferred back to the [facility] service on 1/24.  Significant events: 12/31>> admit to ICU-septic shock-intubated-required pressors stool studies/COVID/RSV/flu negative.  Blood cultures positive for strep 01/3 CRRT 01/5>> TEE done with no evidence of vegetations 01/11>>extubated, transferred from [facility] for iHD 01/17>> transfer to [facility] services 01/18>> developed lower GI bleeding-GI consulted-PRBC transfused 01/18>> transfer back to the ICU 01/20>> colonoscopy with multiple ulcers in colon. 01/24>> transferred to [facility] 02/9 >> stricter ICU for severe metabolic acidosis  Significant studies: 12/30>> CT chest/abdomen/pelvis: Fluid throughout stomach/small bowel/colon-nonspecific 01/05>> CT chest/abdomen/pelvis: Persistent areas of wall thickening along the colon, patchy bilateral ill-defined lung opacities 01/19>> CT abdomen/pelvis: Diarrheal illness with fluid reaching the rectum, colonic wall thickening is improved.  Improving bilateral PNA.  Bilateral nephrolithiasis without obstruction. 01/19>> CT angio GI bleed: Limited evaluation  because of oral contrast within the distal small bowel/throughout colon-active GI bleeding could not be assessed. 01/21>> CT angio GI bleed: No active GI bleeding, multiple infectious/inflammatory colitis involving splenic flexure and ascending colon.  Significant microbiology data: 12/30>> blood culture: Streptococcus Infantarius 12/30>> stool C. difficile negative 12/30>> GI pathogen panel: Negative 01/02>> tracheal aspirate: No organisms 01/03>> blood culture: No growth 01/03>> blood culture: No growth 01/17>> blood culture: No growth  Procedures: 12/31-1/11>> ETT 1/05>> TEE: EF 60-65%-no vegetations 1/20>> colonoscopy: Blood in entire colon, multiple ulcers throughout the colon.  Consults: PCCM GI General surgery Nephrology ID  Subjective:  He had 3 loose bowel movements yesterday, not watery, otherwise no significant events.  He is still expressing some visual hallucinations, reports seeing 3 cats passed by yesterday.  Objective: Vitals: Blood pressure 113/81, pulse (!) 116, temperature 98.3 F (36.8 C), temperature source Oral, resp. rate (!) 22, height 4' 10"$  (1.473 m), weight 66.4 kg, SpO2 99 %.   Exam:   Awake Alert, she is frail, deconditioned, no apparent distress Symmetrical Chest wall movement, Good air movement bilaterally, CTAB RRR,No Gallops,Rubs or new Murmurs, No Parasternal Heave +ve B.Sounds, Abd Soft, No tenderness, No rebound - guarding or rigidity. No Cyanosis, Clubbing or edema, No new Rash or bruise       Assessment/Plan:   Severe metabolic acidosis -Patient bicarb level<7 this morning, VBG confirms metabolic acidosis with compensatory respiratory alkalosis.  Was felt secondary to renal failure and diarrhea, diarrhea has resolved. -On bicarb drip, oral bicarb, bicarb level is 21 today, will DC the bicarb drip - Lactic acid within normal limit -CT abdomen fullwidth with no acute findings  Septic shock likely due to GI source, required  intubation due to septic shock developed ventilator associated  pneumonia, Streptococcus Infantarius bacteremia   Sepsis physiology has resolved TEE negative for vegetations Pneumonia has clinically resolved   Lower GI bleeding due to severe colitis with multiple colon ulcers ischemic versus infectious Acute blood loss anemia - Follow CBC trend (8 units PRBC from 1/18-1/21) -CT angio without any localized bleeding -stool studies negative - Underwent colonoscopy with biopsies obtained during colonoscopy showing nonspecific inflammation, GI has signed off but thought that infectious versus ischemic colitis is in differential. Gen.surgery following, CT abdomen from 04/06/2022 noted with continued infectious/inflammatory colitis, case discussed with general surgery, no surgical plans but continue conservative management with bowel rest till abdominal pain much improved,  now tolerating oral Flagyl for her colitis with serial improvement in her symptoms and exam, will continue Flagyl for another 10 days stop date 04/23/2022.  Needs close outpatient follow-up with [facility] within 7 to 10 days of discharge.  Symptoms much improved.  Acute toxic/metabolic encephalopathy ICU delirium Multifactorial etiology-ICU delirium/encephalopathy due to sepsis/AKI Maintain delirium precautions Continue Seroquel 300 nightly Continue vilazodone -With significantly increased confusion and visual hallucinations, her Seroquel has been uptitrated to 400 g nightly. -Patient with much worsening mentation over last 24 hrs., but this could be attributed to her metabolic acidosis  AKI Multifactorial etiology-felt to be ischemic ATN from septic shock-possibly some component of infectious (strep) glomerulonephritis-and now possibly contrast related (CT angio)  Avoid nephrotoxic agents  Shock liver Improved Trend LFTs periodically  HTN BP stable Continue amlodipine  GERD PPI  Bipolar  disorder/depression Complicated by encephalopathy Continue Seroquel/vilazodone  COPD Not in exacerbation-continue bronchodilators  Hypomagnesemia.  Replaced.    OSA CPAP when able  Nutrition Status: Nutrition Problem: Inadequate oral intake Etiology: inability to eat Signs/Symptoms: NPO status (on vent) Interventions: MVI  Obesity: Estimated body mass index is 30.59 kg/m as calculated from the following:   Height as of this encounter: 4' 10"$  (1.473 m).   Weight as of this encounter: 66.4 kg.   Code status:   Code Status: Full Code   DVT Prophylaxis: heparin injection 5,000 Units Start: 04/20/22 1400 Place and maintain sequential compression device Start: 03/13/22 2117 SCDs Start: 03/10/22 0232   Family Communication: None at bedside, left son a voicemail  Disposition Plan: Status is: Inpatient Remains inpatient appropriate because: Severity of illness   Planned Discharge Destination:Skilled nursing facility   Diet: Diet Order             DIET SOFT Room service appropriate? No; Fluid consistency: Thin  Diet effective now                    MEDICATIONS: Scheduled Meds:  (feeding supplement) PROSource Plus  30 mL Oral BID BM   amLODipine  10 mg Oral Daily   Chlorhexidine Gluconate Cloth  6 each Topical Daily   fluticasone furoate-vilanterol  1 puff Inhalation Daily   And   umeclidinium bromide  1 puff Inhalation Daily   heparin injection (subcutaneous)  5,000 Units Subcutaneous Q8H   lidocaine  1 patch Transdermal Q24H   methocarbamol  1,000 mg Oral Q8H   metroNIDAZOLE  500 mg Oral Q12H   multivitamin  1 tablet Oral QHS   nortriptyline  50 mg Oral QHS   mouth rinse  15 mL Mouth Rinse 4 times per day   pantoprazole  40 mg Oral Daily   QUEtiapine  400 mg Oral QHS   sodium bicarbonate  1,950 mg Oral TID   Vilazodone HCl  40 mg  Oral Daily   Continuous Infusions:  sodium chloride Stopped (04/12/22 1000)   PRN Meds:.sodium chloride, albuterol,  docusate, haloperidol lactate, labetalol, lip balm, LORazepam, mineral oil-hydrophilic petrolatum, ondansetron (ZOFRAN) IV, mouth rinse, oxyCODONE, polyethylene glycol, promethazine, sodium chloride flush   I have personally reviewed following labs and imaging studies  LABORATORY DATA:  Recent Labs  Lab 04/19/22 0515 04/20/22 0210 04/21/22 0044  WBC 10.7* 8.8 8.1  HGB 11.6* 9.4* 8.7*  HCT 35.3* 26.7* 24.7*  PLT 496* 464* 447*  MCV 92.9 87.8 89.5  MCH 30.5 30.9 31.5  MCHC 32.9 35.2 35.2  RDW 18.5* 18.1* 18.2*    Recent Labs  Lab 04/19/22 0515 04/19/22 0904 04/19/22 1217 04/19/22 1538 04/20/22 0211 04/21/22 0044  NA 135 136  --   --  136 137  K 4.6 4.7  --   --  3.8 3.5  CL 108 107  --   --  101 96*  CO2 <7* <7*  --   --  13* 21*  ANIONGAP NOT CALCULATED NOT CALCULATED  --   --  22* 20*  GLUCOSE 115* 119*  --   --  119* 121*  BUN 26* 28*  --   --  28* 27*  CREATININE 4.08* 4.19*  --   --  3.79* 3.24*  ALBUMIN  --   --   --   --  1.6* <1.5*  LATICACIDVEN  --   --  0.8 1.0  --   --   CALCIUM 9.2 9.5  --   --  8.1* 7.6*    MICROBIOLOGY: No results found for this or any previous visit (from the past 240 hour(s)).   RADIOLOGY STUDIES/RESULTS: No results found.   LOS: 87 days   Signature  -    Phillips Climes M.D on 04/21/2022 at 1:35 PM   -  To page go to [email or web address]

## 2022-04-22 DIAGNOSIS — D649 Anemia, unspecified: Secondary | ICD-10-CM

## 2022-04-22 LAB — CBC
HCT: 22 % — ABNORMAL LOW (ref 36.0–46.0)
HCT: 23.2 % — ABNORMAL LOW (ref 36.0–46.0)
Hemoglobin: 7.2 g/dL — ABNORMAL LOW (ref 12.0–15.0)
Hemoglobin: 7.9 g/dL — ABNORMAL LOW (ref 12.0–15.0)
MCH: 30.1 pg (ref 26.0–34.0)
MCH: 30 pg (ref 26.0–34.0)
MCHC: 32.7 g/dL (ref 30.0–36.0)
MCHC: 34.1 g/dL (ref 30.0–36.0)
MCV: 92.1 fL (ref 80.0–100.0)
MCV: 88.2 fL (ref 80.0–100.0)
Platelets: 408 10*3/uL — ABNORMAL HIGH (ref 150–400)
Platelets: 400 10*3/uL (ref 150–400)
RBC: 2.39 MIL/uL — ABNORMAL LOW (ref 3.87–5.11)
RBC: 2.63 MIL/uL — ABNORMAL LOW (ref 3.87–5.11)
RDW: 17.7 % — ABNORMAL HIGH (ref 11.5–15.5)
RDW: 17.7 % — ABNORMAL HIGH (ref 11.5–15.5)
WBC: 7 10*3/uL (ref 4.0–10.5)
WBC: 8.4 10*3/uL (ref 4.0–10.5)
nRBC: 0 % (ref 0.0–0.2)
nRBC: 0 % (ref 0.0–0.2)

## 2022-04-22 LAB — TYPE AND SCREEN
ABO/RH(D): A POS
Antibody Screen: NEGATIVE

## 2022-04-22 LAB — RENAL FUNCTION PANEL
Albumin: 2.2 g/dL — ABNORMAL LOW (ref 3.5–5.0)
Anion gap: 19 — ABNORMAL HIGH (ref 5–15)
BUN: 24 mg/dL — ABNORMAL HIGH (ref 6–20)
CO2: 22 mmol/L (ref 22–32)
Calcium: 8.2 mg/dL — ABNORMAL LOW (ref 8.9–10.3)
Chloride: 99 mmol/L (ref 98–111)
Creatinine, Ser: 3.07 mg/dL — ABNORMAL HIGH (ref 0.44–1.00)
GFR, Estimated: 17 mL/min — ABNORMAL LOW (ref >60)
Glucose, Bld: 88 mg/dL (ref 70–99)
Phosphorus: 3.7 mg/dL (ref 2.5–4.6)
Potassium: 3.9 mmol/L (ref 3.5–5.1)
Sodium: 140 mmol/L (ref 135–145)

## 2022-04-22 LAB — OCCULT BLOOD X 1 CARD TO LAB, STOOL: Fecal Occult Bld: POSITIVE — AB

## 2022-04-22 MED ORDER — LACTATED RINGERS IV SOLN: INTRAVENOUS | Status: AC

## 2022-04-22 NOTE — Progress Notes (Signed)
Physical Therapy Treatment Patient Details Name: Darlene Barber MRN: [id number] DOB: Apr 30, 1965 Today's Date: 04/22/2022   History of Present Illness 57 yo female admitted 12/30 to [facility] with abdominal pain and rash with sepsis and AKI. Respiratory failure intubated 1/2-1/11. CRRT (1/3-1/8, 1/10-1/11). pt also with metabolic encephalopathy acutely. PMhx: chronic pain, GERD, bipolar, tobacco and THC abuse, insomnia, HTN, tremor    PT Comments    Pt tolerated today's session well, requesting to stay in the room but able to ambulate x2 trials with RW, requiring encouragement for second trial. Pt's son present throughout, education provided on current discharge plans with social worker talking to pt and her son during the session. Pt continues to benefit from skilled acute PT to encourage progression of mobility and insure safety. Current discharge plan remains appropriate, acute PT will continue to follow.     Recommendations for follow up therapy are one component of a multi-disciplinary discharge planning process, led by the attending physician.  Recommendations may be updated based on patient status, additional functional criteria and insurance authorization.  Follow Up Recommendations  Skilled nursing-short term rehab (<3 hours/day) Can patient physically be transported by private vehicle: Yes   Assistance Recommended at Discharge Frequent or constant Supervision/Assistance  Patient can return home with the following A little help with walking and/or transfers;A little help with bathing/dressing/bathroom;Assistance with cooking/housework;Help with stairs or ramp for entrance   Equipment Recommendations  Rolling walker (2 wheels)    Recommendations for Other Services       Precautions / Restrictions Precautions Precautions: Fall Restrictions Weight Bearing Restrictions: No     Mobility  Bed Mobility Overal bed mobility: Needs Assistance Bed Mobility: Supine to Sit, Sit to  Supine     Supine to sit: HOB elevated, Supervision Sit to supine: HOB elevated, Supervision   General bed mobility comments: bed rail utilized, supervision provided for safety as pt reports having difficulty this morning with bed mobility    Transfers Overall transfer level: Needs assistance Equipment used: Rolling walker (2 wheels) Transfers: Sit to/from Stand, Bed to chair/wheelchair/BSC Sit to Stand: Supervision Stand pivot transfers: Supervision         General transfer comment: stand pivot to/from [facility] with supervision with no AD, just reaching for handles on BSC, supervision with one cue for hand placement for sit<>stand with RW prior to ambulation    Ambulation/Gait Ambulation/Gait assistance: Min guard Gait Distance (Feet): 15 Feet (x2 trials) Assistive device: Rolling walker (2 wheels) Gait Pattern/deviations: Step-through pattern, Decreased stride length, Drifts right/left Gait velocity: decreased     General Gait Details: mild cueing provided for RW proximity and B shoulder elevation, correcting with cues. Pt provided rest break between trials, declining any attempts at ambulating in the hallway. 1 standing rest break for deep breathing as pt becoming anxious with ambulation today   Stairs             Wheelchair Mobility    Modified Rankin (Stroke Patients Only)       Balance Overall balance assessment: Needs assistance Sitting-balance support: No upper extremity supported, Feet supported Sitting balance-Leahy Scale: Good Sitting balance - Comments: supervision sitting, stable balance with static and dynamic activity   Standing balance support: Bilateral upper extremity supported, During functional activity, Reliant on assistive device for balance Standing balance-Leahy Scale: Fair Standing balance comment: reliant on BUE support for standing and ambulation  Cognition Arousal/Alertness: Awake/alert Behavior  During Therapy: Anxious Overall Cognitive Status: Impaired/Different from baseline Area of Impairment: Safety/judgement                   Current Attention Level: Selective     Safety/Judgement: Decreased awareness of deficits Awareness: Emergent   General Comments: pt intermittently becomes emotional and anxious but redirectable.        Exercises      General Comments General comments (skin integrity, edema, etc.): VSS on room air, HR elevating to 120s with mobility, son present throughout session      Pertinent Vitals/Pain Pain Assessment Pain Assessment: No/denies pain    Home Living                          Prior Function            PT Goals (current goals can now be found in the care plan section) Acute Rehab PT Goals Patient Stated Goal: to get better PT Goal Formulation: With family Time For Goal Achievement: 04/29/22 Potential to Achieve Goals: Good Progress towards PT goals: Progressing toward goals    Frequency    Min 2X/week      PT Plan Current plan remains appropriate    Co-evaluation              AM-PAC PT "6 Clicks" Mobility   Outcome Measure  Help needed turning from your back to your side while in a flat bed without using bedrails?: None Help needed moving from lying on your back to sitting on the side of a flat bed without using bedrails?: None Help needed moving to and from a bed to a chair (including a wheelchair)?: A Little Help needed standing up from a chair using your arms (e.g., wheelchair or bedside chair)?: A Little Help needed to walk in hospital room?: A Little Help needed climbing 3-5 steps with a railing? : A Lot 6 Click Score: 19    End of Session Equipment Utilized During Treatment: Gait belt Activity Tolerance: Patient tolerated treatment well Patient left: with call bell/phone within reach;in bed;with family/visitor present Nurse Communication: Mobility status PT Visit Diagnosis: Muscle  weakness (generalized) (M62.81);Difficulty in walking, not elsewhere classified (R26.2)     Time: [id number] PT Time Calculation (min) (ACUTE ONLY): 27 min  Charges:  $Gait Training: 8-22 mins $Therapeutic Activity: 8-22 mins                     Charlynne Cousins, PT DPT Acute Rehabilitation Services Office [phone]    Luvenia Heller 04/22/2022, 4:35 PM

## 2022-04-22 NOTE — Progress Notes (Signed)
PROGRESS NOTE        PATIENT DETAILS Name: Darlene Barber Age: 57 y.o. Sex: female Date of Birth: 21-Sep-1965 Admit Date: 03/10/2022 Admitting Physician Jacky Kindle, MD [id number], Chrystie Nose, MD  Brief Summary:  Patient is a 57 y.o.  female who was initially admitted to the ICU with septic shock-respiratory failure requiring pressors/intubation on initial presentation.  She was subsequently was found to have Streptococcus bacteremia.  Hospital course was complicated by AKI requiring CRRT and acute metabolic encephalopathy/ICU delirium.  She was stabilized and transferred to [facility] on 1/17.  Subsequent hospital course complicated by lower GI bleeding with acute blood loss anemia-requiring transfer back to the ICU.  Ultimately she was found to have large ulcers in her entire colon.  She required multiple units of PRBC transfusion.  She was transferred back to the [facility] service on 1/24.  Significant events: 12/31>> admit to ICU-septic shock-intubated-required pressors stool studies/COVID/RSV/flu negative.  Blood cultures positive for strep 01/3 CRRT 01/5>> TEE done with no evidence of vegetations 01/11>>extubated, transferred from [facility] for iHD 01/17>> transfer to [facility] services 01/18>> developed lower GI bleeding-GI consulted-PRBC transfused 01/18>> transfer back to the ICU 01/20>> colonoscopy with multiple ulcers in colon. 01/24>> transferred to [facility] 02/9 >> stricter ICU for severe metabolic acidosis  Significant studies: 12/30>> CT chest/abdomen/pelvis: Fluid throughout stomach/small bowel/colon-nonspecific 01/05>> CT chest/abdomen/pelvis: Persistent areas of wall thickening along the colon, patchy bilateral ill-defined lung opacities 01/19>> CT abdomen/pelvis: Diarrheal illness with fluid reaching the rectum, colonic wall thickening is improved.  Improving bilateral PNA.  Bilateral nephrolithiasis without obstruction. 01/19>> CT angio GI bleed: Limited evaluation  because of oral contrast within the distal small bowel/throughout colon-active GI bleeding could not be assessed. 01/21>> CT angio GI bleed: No active GI bleeding, multiple infectious/inflammatory colitis involving splenic flexure and ascending colon.  Significant microbiology data: 12/30>> blood culture: Streptococcus Infantarius 12/30>> stool C. difficile negative 12/30>> GI pathogen panel: Negative 01/02>> tracheal aspirate: No organisms 01/03>> blood culture: No growth 01/03>> blood culture: No growth 01/17>> blood culture: No growth  Procedures: 12/31-1/11>> ETT 1/05>> TEE: EF 60-65%-no vegetations 1/20>> colonoscopy: Blood in entire colon, multiple ulcers throughout the colon.  Consults: PCCM GI General surgery Nephrology ID  Subjective:  She had one BM this morning, no melena or Blood noted with stool  Objective: Vitals: Blood pressure 130/73, pulse (!) 111, temperature 98.1 F (36.7 C), temperature source Oral, resp. rate 16, height 4' 10"$  (1.473 m), weight 66.4 kg, SpO2 98 %.   Exam:  Awake Alert, she is oriented x 3 today (she thinks she is at [place] long and at [facility]), mentation and confusion has improved, she denies any delirium overnight. Symmetrical Chest wall movement, Good air movement bilaterally, CTAB RRR,No Gallops,Rubs or new Murmurs, No Parasternal Heave +ve B.Sounds, Abd Soft, No tenderness, No rebound - guarding or rigidity. No Cyanosis, Clubbing or edema, No new Rash or bruise       Assessment/Plan:   Severe metabolic acidosis -Patient bicarb level<7 this morning, VBG confirms metabolic acidosis with compensatory respiratory alkalosis.  Was felt secondary to renal failure and diarrhea, diarrhea has resolved. -On bicarb drip, bicarb level has normalized, currently off the drip, only on p.o. bicarb - Lactic acid within normal limit -CT abdomen fullwidth with no acute findings  Septic shock likely due to GI source, required intubation due  to septic shock  developed ventilator associated pneumonia, Streptococcus Infantarius bacteremia   Sepsis physiology has resolved TEE negative for vegetations Pneumonia has clinically resolved   Lower GI bleeding due to severe colitis with multiple colon ulcers ischemic versus infectious Acute blood loss anemia - Follow CBC trend (8 units PRBC from 1/18-1/21) -CT angio without any localized bleeding -stool studies negative - Underwent colonoscopy with biopsies obtained during colonoscopy showing nonspecific inflammation, GI has signed off but thought that infectious versus ischemic colitis is in differential. Gen.surgery following, CT abdomen from 04/06/2022 noted with continued infectious/inflammatory colitis, case discussed with general surgery, no surgical plans but continue conservative management with bowel rest till abdominal pain much improved,  now tolerating oral Flagyl for her colitis with serial improvement in her symptoms and exam. -Treated with Flagyl x 10 days. - Needs close outpatient follow-up with [facility] [place] [facility] within 7 to 10 days of discharge.  Symptoms much improved. -Hemoglobin dropped this morning to 7.2, most likely delusional, Hemoccult still pending, but BM earlier today with no melena or bleed, keep active type and screen.  Laurey Arrow level was reassuring at 7.9, will continue to monitor CBC closely.  Acute toxic/metabolic encephalopathy ICU delirium Multifactorial etiology-ICU delirium/encephalopathy due to sepsis/AKI Maintain delirium precautions Continue Seroquel 300 nightly Continue vilazodone -With significantly increased confusion and visual hallucinations, her Seroquel has been uptitrated to 400 g nightly. -Patient with much worsening mentation over last 24 hrs., but this could be attributed to her metabolic acidosis  AKI Multifactorial etiology-felt to be ischemic ATN from septic shock-possibly some component of infectious (strep)  glomerulonephritis-and now possibly contrast related (CT angio)  Avoid nephrotoxic agents  Shock liver Improved Trend LFTs periodically  HTN BP stable Continue amlodipine  GERD PPI  Bipolar disorder/depression Complicated by encephalopathy Continue Seroquel/vilazodone  COPD Not in exacerbation-continue bronchodilators  Hypomagnesemia.  Replaced.    OSA CPAP when able  Nutrition Status: Nutrition Problem: Inadequate oral intake Etiology: inability to eat Signs/Symptoms: NPO status (on vent) Interventions: MVI  Obesity: Estimated body mass index is 30.59 kg/m as calculated from the following:   Height as of this encounter: 4' 10"$  (1.473 m).   Weight as of this encounter: 66.4 kg.   Code status:   Code Status: Full Code   DVT Prophylaxis: Place and maintain sequential compression device Start: 03/13/22 2117 SCDs Start: 03/10/22 0232   Family Communication: None at bedside, discussed with son by phone 2/11.  Disposition Plan: Status is: Inpatient Remains inpatient appropriate because: Severity of illness   Planned Discharge Destination:Skilled nursing facility   Diet: Diet Order             DIET SOFT Room service appropriate? No; Fluid consistency: Thin  Diet effective now                    MEDICATIONS: Scheduled Meds:  (feeding supplement) PROSource Plus  30 mL Oral BID BM   amLODipine  10 mg Oral Daily   Chlorhexidine Gluconate Cloth  6 each Topical Daily   fluticasone furoate-vilanterol  1 puff Inhalation Daily   And   umeclidinium bromide  1 puff Inhalation Daily   lidocaine  1 patch Transdermal Q24H   methocarbamol  1,000 mg Oral Q8H   metroNIDAZOLE  500 mg Oral Q12H   multivitamin  1 tablet Oral QHS   nortriptyline  50 mg Oral QHS   mouth rinse  15 mL Mouth Rinse 4 times per day   pantoprazole  40 mg Oral Daily  QUEtiapine  400 mg Oral QHS   sodium bicarbonate  650 mg Oral TID   Vilazodone HCl  40 mg Oral Daily   Continuous  Infusions:  sodium chloride Stopped (04/12/22 1000)   lactated ringers     PRN Meds:.sodium chloride, albuterol, docusate, haloperidol lactate, labetalol, lip balm, loperamide, LORazepam, mineral oil-hydrophilic petrolatum, ondansetron (ZOFRAN) IV, mouth rinse, oxyCODONE, polyethylene glycol, promethazine, sodium chloride flush   I have personally reviewed following labs and imaging studies  LABORATORY DATA:  Recent Labs  Lab 04/19/22 0515 04/20/22 0210 04/21/22 0044 04/22/22 0416 04/22/22 1143  WBC 10.7* 8.8 8.1 7.0 8.4  HGB 11.6* 9.4* 8.7* 7.2* 7.9*  HCT 35.3* 26.7* 24.7* 22.0* 23.2*  PLT 496* 464* 447* 408* 400  MCV 92.9 87.8 89.5 92.1 88.2  MCH 30.5 30.9 31.5 30.1 30.0  MCHC 32.9 35.2 35.2 32.7 34.1  RDW 18.5* 18.1* 18.2* 17.7* 17.7*    Recent Labs  Lab 04/19/22 0515 04/19/22 0904 04/19/22 1217 04/19/22 1538 04/20/22 0211 04/21/22 0044 04/22/22 0416  NA 135 136  --   --  136 137 140  K 4.6 4.7  --   --  3.8 3.5 3.9  CL 108 107  --   --  101 96* 99  CO2 <7* <7*  --   --  13* 21* 22  ANIONGAP NOT CALCULATED NOT CALCULATED  --   --  22* 20* 19*  GLUCOSE 115* 119*  --   --  119* 121* 88  BUN 26* 28*  --   --  28* 27* 24*  CREATININE 4.08* 4.19*  --   --  3.79* 3.24* 3.07*  ALBUMIN  --   --   --   --  1.6* <1.5* 2.2*  LATICACIDVEN  --   --  0.8 1.0  --   --   --   CALCIUM 9.2 9.5  --   --  8.1* 7.6* 8.2*    MICROBIOLOGY: No results found for this or any previous visit (from the past 240 hour(s)).   RADIOLOGY STUDIES/RESULTS: No results found.   LOS: 54 days   Signature  -    Phillips Climes M.D on 04/22/2022 at 3:09 PM   -  To page go to [email or web address]

## 2022-04-23 LAB — CBC
HCT: 27.2 % — ABNORMAL LOW (ref 36.0–46.0)
Hemoglobin: 9.3 g/dL — ABNORMAL LOW (ref 12.0–15.0)
MCH: 30.6 pg (ref 26.0–34.0)
MCHC: 34.2 g/dL (ref 30.0–36.0)
MCV: 89.5 fL (ref 80.0–100.0)
Platelets: 442 10*3/uL — ABNORMAL HIGH (ref 150–400)
RBC: 3.04 MIL/uL — ABNORMAL LOW (ref 3.87–5.11)
RDW: 17.5 % — ABNORMAL HIGH (ref 11.5–15.5)
WBC: 9.1 10*3/uL (ref 4.0–10.5)
nRBC: 0 % (ref 0.0–0.2)

## 2022-04-23 LAB — COMPREHENSIVE METABOLIC PANEL
ALT: 23 U/L (ref 0–44)
AST: 28 U/L (ref 15–41)
Albumin: 2.5 g/dL — ABNORMAL LOW (ref 3.5–5.0)
Alkaline Phosphatase: 422 U/L — ABNORMAL HIGH (ref 38–126)
Anion gap: 17 — ABNORMAL HIGH (ref 5–15)
BUN: 24 mg/dL — ABNORMAL HIGH (ref 6–20)
CO2: 22 mmol/L (ref 22–32)
Calcium: 8.4 mg/dL — ABNORMAL LOW (ref 8.9–10.3)
Chloride: 99 mmol/L (ref 98–111)
Creatinine, Ser: 3.09 mg/dL — ABNORMAL HIGH (ref 0.44–1.00)
GFR, Estimated: 17 mL/min — ABNORMAL LOW (ref >60)
Glucose, Bld: 111 mg/dL — ABNORMAL HIGH (ref 70–99)
Potassium: 4.2 mmol/L (ref 3.5–5.1)
Sodium: 138 mmol/L (ref 135–145)
Total Bilirubin: 0.6 mg/dL (ref 0.3–1.2)
Total Protein: 5.8 g/dL — ABNORMAL LOW (ref 6.5–8.1)

## 2022-04-23 LAB — GLUCOSE, CAPILLARY
Glucose-Capillary: 115 mg/dL — ABNORMAL HIGH (ref 70–99)
Glucose-Capillary: 105 mg/dL — ABNORMAL HIGH (ref 70–99)
Glucose-Capillary: 100 mg/dL — ABNORMAL HIGH (ref 70–99)

## 2022-04-23 MED ORDER — ONDANSETRON 4 MG PO TBDP
4.0000 mg | ORAL_TABLET | Freq: Three times a day (TID) | ORAL | Status: DC | PRN
  Administered 2022-04-23: 4 mg via ORAL
  Filled 2022-04-23: qty 1

## 2022-04-23 MED ORDER — QUETIAPINE FUMARATE 400 MG PO TABS: 400.0000 mg | ORAL_TABLET | Freq: Every day | ORAL | Status: DC

## 2022-04-23 MED ORDER — SODIUM BICARBONATE 650 MG PO TABS: 650.0000 mg | ORAL_TABLET | Freq: Two times a day (BID) | ORAL | Status: DC

## 2022-04-23 MED ORDER — OXYCODONE HCL 5 MG PO TABS: 5.0000 mg | ORAL_TABLET | Freq: Four times a day (QID) | ORAL | 0 refills | Status: DC | PRN

## 2022-04-23 MED ORDER — LIDOCAINE HCL URETHRAL/MUCOSAL 2 % EX GEL
1.0000 | Freq: Once | CUTANEOUS | Status: AC
  Administered 2022-04-23: 1 via TOPICAL
  Filled 2022-04-23: qty 6

## 2022-04-23 MED ORDER — LOPERAMIDE HCL 2 MG PO CAPS: 2.0000 mg | ORAL_CAPSULE | ORAL | 0 refills | Status: DC | PRN

## 2022-04-23 NOTE — Progress Notes (Signed)
Patient refused blood draw for Labs. Patient educated. MD notified.

## 2022-04-23 NOTE — TOC Transition Note (Signed)
Transition of Care [facility]) - CM/SW Discharge Note   Patient Details  Name: Darlene Barber MRN: [id number] Date of Birth: [birth year]  Transition of [facility]) CM/SW Contact:  Benard Halsted, LCSW Phone Number: 04/23/2022, 2:25 PM   Clinical Narrative:    Patient will DC to: [facility] Anticipated DC date: 04/23/22 Family notified: Son, Marjory Lies Transport by: Corey Harold   Per MD patient ready for DC to [place]. RN to call report prior to discharge [phone] room 501B). RN, patient, patient's family, and facility notified of DC. Discharge Summary and FL2 sent to facility. DC packet on chart including signed script. Ambulance transport requested for patient.   CSW will sign off for now as social work intervention is no longer needed. Please consult [place] again if new needs arise.     Final next level of care: Skilled Nursing Facility Barriers to Discharge: Barriers Resolved   Patient Goals and CMS Choice CMS Medicare.gov Compare Post Acute Care list provided to:: Patient Choice offered to / list presented to : Patient, Adult Children  Discharge Placement     Existing PASRR number confirmed : 04/23/22          Patient chooses bed at: [facility] Patient to be transferred to facility by: [place] Name of family member notified: Son Patient and family notified of of transfer: 04/23/22  Discharge Plan and Services Additional resources added to the After Visit Summary for   In-house Referral: Clinical Social Work                                   Social Determinants of Health (SDOH) Interventions SDOH Screenings   Depression (PHQ2-9): High Risk (09/03/2021)  Tobacco Use: High Risk (04/01/2022)     Readmission Risk Interventions    03/11/2022   12:18 PM  Readmission Risk Prevention Plan  Transportation Screening Complete  Medication Review ([place]) Complete  PCP or Specialist appointment within 3-5 days of discharge Complete  HRI or  [place] Complete  SW Recovery Care/Counseling Consult Complete  [place] Not Applicable

## 2022-04-23 NOTE — Progress Notes (Signed)
Mobility Specialist Progress Note   04/23/22 1114  Mobility  Activity Transferred to/from [facility]  Level of Assistance Contact guard assist, steadying assist  Assistive Device Other (Comment) (HHA)  Distance Ambulated (ft) 2 ft  Activity Response Tolerated well  Mobility Referral Yes  $Mobility charge 1 Mobility   Pt requesting assistance to get from bed to [facility] d/t BM urgency. Required CGA for safety  throughout with use of HHA for transfer. Pt left on BSC w/o fault, call bell in reach.  Holland Falling Mobility Specialist Please contact via SecureChat or  Rehab office at [phone]

## 2022-04-23 NOTE — Progress Notes (Signed)
Occupational Therapy Treatment Patient Details Name: Darlene Barber MRN: [id number] DOB: [birth year] Today's Date: 04/23/2022   History of present illness 57 yo female admitted 12/30 to [facility] with abdominal pain and rash with sepsis and AKI. Respiratory failure intubated 1/2-1/11. CRRT (1/3-1/8, 1/10-1/11). pt also with metabolic encephalopathy acutely. PMhx: chronic pain, GERD, bipolar, tobacco and THC abuse, insomnia, HTN, tremor   OT comments  Pt making good progress towards OT goals. Pt received on BSC, able to manage hygiene and pivot back to bed with Modified Independence. Focused on functional mobility and balance when reaching outside of BOS to obtain Adl items without LOB noted. Collaborated on fall prevention strategies, optimal DME mgmt and breathing techniques to combat anxiety. Noted pt still awaiting SNF rehab w/ difficulty obtaining bed d/t insurance. Continue to rec SNF rehab at DC but will also be considering independence/safety during sessions if DC plan changes to home.    Recommendations for follow up therapy are one component of a multi-disciplinary discharge planning process, led by the attending physician.  Recommendations may be updated based on patient status, additional functional criteria and insurance authorization.    Follow Up Recommendations  Skilled nursing-short term rehab (<3 hours/day)     Assistance Recommended at Discharge Set up Supervision/Assistance  Patient can return home with the following  A little help with bathing/dressing/bathroom;Assistance with cooking/housework;Help with stairs or ramp for entrance;Assist for transportation   Equipment Recommendations  BSC/3in1;Other (comment);Wheelchair (measurements OT);Wheelchair cushion (measurements OT) (RW)    Recommendations for Other Services      Precautions / Restrictions Precautions Precautions: Fall;Other (comment) Precaution Comments: watch HR Restrictions Weight Bearing Restrictions: No        Mobility Bed Mobility Overal bed mobility: Modified Independent Bed Mobility: Sit to Supine                Transfers Overall transfer level: Modified independent Equipment used: Rolling walker (2 wheels) Transfers: Sit to/from Stand, Bed to chair/wheelchair/BSC Sit to Stand: Modified independent (Device/Increase time) Stand pivot transfers: Modified independent (Device/Increase time)         General transfer comment: received on BSC, no AD or assist for back to bed transfer. able to stand with RW easily from bedside for ADLs in room     Balance Overall balance assessment: Needs assistance Sitting-balance support: No upper extremity supported, Feet supported Sitting balance-Leahy Scale: Good     Standing balance support: Bilateral upper extremity supported, During functional activity, Reliant on assistive device for balance Standing balance-Leahy Scale: Fair Standing balance comment: reliant on BUE support for standing and ambulation                           ADL either performed or assessed with clinical judgement   ADL Overall ADL's : Needs assistance/impaired                         Toilet Transfer: Modified English as a second language teacher Details (indicate cue type and reason): able to pivot back to bed from [facility] without assist Toileting- Clothing Manipulation and Hygiene: Modified independent;Sit to/from stand;Sitting/lateral lean Toileting - Clothing Manipulation Details (indicate cue type and reason): managing peri care     Functional mobility during ADLs: Supervision/safety;Min guard;Rolling walker (2 wheels) General ADL Comments: Focus on item retrieval in room with pt able to open closet, reach overhead and slightly below BOS to grab items. Pt did not feel safe reaching to floor for item  so educated on use of reacher for this type of task at home. Collab about bags on walker for easier carrying of items     Extremity/Trunk Assessment Upper Extremity Assessment Upper Extremity Assessment: Generalized weakness   Lower Extremity Assessment Lower Extremity Assessment: Defer to PT evaluation        Vision   Vision Assessment?: No apparent visual deficits   Perception     Praxis      Cognition Arousal/Alertness: Awake/alert Behavior During Therapy: Anxious Overall Cognitive Status: Impaired/Different from baseline Area of Impairment: Safety/judgement, Memory, Awareness                     Memory: Decreased short-term memory   Safety/Judgement: Decreased awareness of deficits Awareness: Emergent   General Comments: pleasant,anxious, some confusion and memory deficits noted as pt upset about them "poking 5 needles in my head" yesterday though OT could not find anything in chart regarding this. Pt also with difficulty recalling recent ICU transfer        Exercises      Shoulder Instructions       General Comments      Pertinent Vitals/ Pain       Pain Assessment Pain Assessment: No/denies pain  Home Living                                          Prior Functioning/Environment              Frequency  Min 2X/week        Progress Toward Goals  OT Goals(current goals can now be found in the care plan section)  Progress towards OT goals: Progressing toward goals  Acute Rehab OT Goals Patient Stated Goal: be able to DC from hospital, whether home or rehab` OT Goal Formulation: With patient Time For Goal Achievement: 04/30/22 Potential to Achieve Goals: Good ADL Goals Pt Will Perform Grooming: standing;with modified independence Pt Will Perform Lower Body Dressing: with modified independence;sit to/from stand Pt Will Transfer to Toilet: with supervision;ambulating Pt/caregiver will Perform Home Exercise Program: Increased strength;Both right and left upper extremity;With theraband;Independently;With written HEP provided Additional  ADL Goal #1: pt to increase standing activity tolerance > 10 min during ADL/IADL tasks  Plan Discharge plan remains appropriate;Frequency remains appropriate    Co-evaluation                 AM-PAC OT "6 Clicks" Daily Activity     Outcome Measure   Help from another person eating meals?: None Help from another person taking care of personal grooming?: None Help from another person toileting, which includes using toliet, bedpan, or urinal?: None Help from another person bathing (including washing, rinsing, drying)?: A Little Help from another person to put on and taking off regular upper body clothing?: A Little Help from another person to put on and taking off regular lower body clothing?: A Little 6 Click Score: 21    End of Session Equipment Utilized During Treatment: Rolling walker (2 wheels)  OT Visit Diagnosis: History of falling (Z91.81);Muscle weakness (generalized) (M62.81);Repeated falls (R29.6);Other symptoms and signs involving cognitive function;Pain   Activity Tolerance Patient tolerated treatment well   Patient Left in bed;with call bell/phone within reach   Nurse Communication Mobility status        Time: [id number] OT Time Calculation (min): 25 min  Charges: OT General Charges $OT Visit:  1 Visit OT Treatments $Self Care/Home Management : 23-37 mins  Malachy Chamber, OTR/L Acute Rehab Services Office: [phone]   Layla Maw 04/23/2022, 10:37 AM

## 2022-04-23 NOTE — Consult Note (Signed)
  Patient seen and briefly assessed.  Received psych consult for history of schizophrenia, bipolar disorder, visual changes present, but improved mood, increased anxiety please see if patient will benefit from medication adjustment.  Patient is alert and oriented x4, calm and cooperative, very attentive and engages well with psychiatric nurse practitioner.   On today's evaluation he is observed to be sitting on the edge of the bed. She  appears to be receptive to current situation at hand to include searching for a new placement for ongoing rehab.  She further denies any acute psychiatric symptoms that include suicidal ideations, homicidal ideations, and or auditory or visual hallucinations.  Patient denies any complaints and or questions. She does mention some increased anxiety, primarily due to hospital length of stay.   "I have never been admitted to the hospital before besides my 2 deliveries.  I am very anxious about being here I think anyone would.  "  Patient does report a previous psychiatric history of schizoaffective, bipolar type.  She is currently taking Seroquel, reports compliance with her psychotropic medication when at home.  She reports 2 previous inpatient hospitalizations for medication management.  Denies any history of suicide attempt and or nonsuicidal self-injurious behavior.  She does report family history of paternal grandmother and paternal great-grandmother with diagnosis of schizophrenia, both deceased after receiving ECT treatment.  She is currently seeing Annie Paras at [facility] in [place].  She currently resides with her oldest son.   She is very appropriate and does seem to have a linear conversation.  There does not appear to be any evidence of confabulation, psychosis, delusional thinking.  She does not appear to be responding to internal stimuli, external stimuli.  She is able to engage well and follow all commands.  She further denies any thoughts to want to harm herself or  other people.  She has been compliant with her psychotropic medications outside of the hospital.  Will psychiatrically clear at this time.  This does not appear to be acute exacerbation of schizophrenia, however patient is now at his baseline from a psychiatric standpoint.  Patient is also preparing for discharge today.   Psychiatric consult service to sign off at this time.  Patient cites no additional changes and or medication adjustments are needed.

## 2022-04-23 NOTE — Progress Notes (Signed)
Nutrition Follow-up  DOCUMENTATION CODES:   Obesity unspecified  INTERVENTION:   Continue Renal Multivitamin w/ minerals daily Encourage good PO intake Discontinue 30 ml ProSource Plus BID, each supplement provides 100 kcals and 15 grams protein.  Meal ordering with assist  NUTRITION DIAGNOSIS:   Inadequate oral intake related to inability to eat as evidenced by NPO status (on vent). - Progressing, diet advanced  GOAL:   Patient will meet greater than or equal to 90% of their needs - Ongoing  MONITOR:   PO intake, Labs, Weight trends, I & O's  REASON FOR ASSESSMENT:   Consult Enteral/tube feeding initiation and management (trickle TF with recs)  ASSESSMENT:   57 yo female w/ pertinent PMH HTN, T2DM, OSA, urge incontinence, bipolar depression presents to ED on 12/30 w/ rash and sob. Found to have sepsis and acute hypoxemic respiratory failure.  12/30 - admit 01/01 - intubated, trickle TF started 01/03 - CRRT start 01/04 - TF advanced to goal rate 01/08 - CRRT stop 01/10 - CRRT restart 01/11 - extubated, CRRT stop, transferred to [facility] for iHD 01/12 - Cortrak placed (tip gastric) 01/16 - diet advanced to full liquids 01/17 - diet advanced to dysphagia 3 01/18 - TF changed to nocturnal (never started) 01/19 - clear liquids 01/20 - NPO, s/p colonoscopy showing large amount of blood and multiple ulcerations on the R side of the colon (no source identified), s/p EGD with no source of bleeding, Cortrak removed 01/21 - recurrent GI bleed 01/22 - diet advanced to clear liquids 01/23 - transferred to floor 01/29 - diet advanced to Soft 02/09 - transferred to ICU for acidosis  02/11 - transferred back to floor  Discussed case with RN. Pt refusing pm and am labs due to it hurting.   Met with pt in room, pt sitting on edge of bed. Reports that she ate some grits this morning but did not have condiments that she likes with them so she did not eat much. But wanted to eat  something due to having to take pain medication. Reports not taking the ProSource due to not liking it. Discussed changing to meal ordering with assist to allow pt to select food options to have better PO intake.  Meal Intake  2/10-2/12: 25-50% x 8 meals (average 34%)  Medications reviewed and include: Rena-vit, Flagyl, Protonix Labs reviewed.  Diet Order:   Diet Order             DIET SOFT Room service appropriate? No; Fluid consistency: Thin  Diet effective now                   EDUCATION NEEDS:   No education needs have been identified at this time  Skin:  Skin Assessment: Skin Integrity Issues: Skin Integrity Issues:: Other (Comment) Other: skin tear L knee, MASD groin/abd/inner thighs, MARSI bilateral buttocks  Last BM:  2/12 - Type 7, medium  Height:  Ht Readings from Last 1 Encounters:  03/12/22 4' 10"$  (1.473 m)   Weight:  Wt Readings from Last 1 Encounters:  04/23/22 70.4 kg   Ideal Body Weight:  43.2 kg  BMI:  Body mass index is 32.44 kg/m.  Estimated Nutritional Needs:  Kcal:  1700-1900 Protein:  85-100 grams Fluid:  >/= 1.7 L   Hermina Barters RD, LDN Clinical Dietitian See [facility] for contact information.

## 2022-04-23 NOTE — TOC Progression Note (Signed)
Transition of Care [facility]) - Progression Note    Patient Details  Name: Darlene Barber MRN: [id number] Date of Birth: 02-Apr-1965  Transition of [facility]) CM/SW [place], LCSW Phone Number: 04/23/2022, 12:36 PM  Clinical Narrative:    Per MD patient ready for discharge today. CSW updated facility, patient and son. They are requesting [facility] for transport.    Expected Discharge Plan: [place] Barriers to Discharge: Barriers Resolved  Expected Discharge Plan and Services In-house Referral: Clinical Social Work     Living arrangements for the past 2 months: Single Family Home Expected Discharge Date: 04/13/22                                     Social Determinants of Health (SDOH) Interventions SDOH Screenings   Depression (PHQ2-9): High Risk (09/03/2021)  Tobacco Use: High Risk (04/01/2022)    Readmission Risk Interventions    03/11/2022   12:18 PM  Readmission Risk Prevention Plan  Transportation Screening Complete  Medication Review ([employer]) Complete  PCP or Specialist appointment within 3-5 days of discharge Complete  HRI or [place] Complete  SW Recovery Care/Counseling Consult Complete  Pray Not Applicable

## 2022-04-23 NOTE — Progress Notes (Signed)
Mobility Specialist Progress Note   04/23/22 1219  Mobility  Activity Ambulated with assistance in room;Ambulated with assistance in hallway  Level of Assistance Minimal assist, patient does 75% or more  Assistive Device Four wheel walker (Rollator)  Distance Ambulated (ft) 18 ft  Activity Response Tolerated fair  Mobility Referral Yes  $Mobility charge 1 Mobility   Pre Mobility: 112 HR, 95% SpO2 During Mobility: 120 HR, 93%SpO2 Post Mobility:110 HR, 96% SpO2  Received sitting EOB c/o nausea but agreeable. Educated pt on rollator and it's use and pt requiring min cues throughout for safe management.  Ambulation limited by anxiety and nausea, pt requiring to sit x2 d/t mentioned symptoms. Pt requesting to go back to room in fear of emesis. Returned back to bed but requiring min cues on safety d/t impulsivity. Left w/ call bell in reach and MD in room.     Holland Falling Mobility Specialist Please contact via SecureChat or  Rehab office at [phone]

## 2022-04-23 NOTE — Discharge Summary (Addendum)
Physician Discharge Summary  Darlene Barber [id number] DOB: November 14, 1965 DOA: 03/10/2022  PCP: [name], MD  Admit date: 03/10/2022 Discharge date: 04/23/2022   Disposition:  snf  Recommendations for Outpatient Follow-up:  Please check CBC, BMP in 3 days and check frequently to ensure stable hemoglobin, bicarb and improving renal function PCP Please follow up on the following pending results: Monitor CBC, BMP and magnesium closely, needs close outpatient follow-up with nephrology, GI and general surgery   Discharge Condition: Stable    CODE STATUS: Full    Diet Recommendation: Heart Healthy - Soft  Brief/Interim Summary: 57 y.o.  female who was initially admitted to the ICU with septic shock-respiratory failure requiring pressors/intubation on initial presentation.  She was subsequently was found to have Streptococcus bacteremia.  Hospital course was complicated by AKI requiring CRRT and acute metabolic encephalopathy/ICU delirium.  She was stabilized and transferred to [facility] on 1/17.  Subsequent hospital course complicated by lower GI bleeding with acute blood loss anemia-requiring transfer back to the ICU.  Ultimately she was found to have large ulcers in her entire colon.  She required multiple units of PRBC transfusion.  She was transferred back to the [facility] service on 1/24.   Significant events: 12/31>> admit to ICU-septic shock-intubated-required pressors stool studies/COVID/RSV/flu negative.  Blood cultures positive for strep 01/3 CRRT 01/5>> TEE done with no evidence of vegetations 01/11>>extubated, transferred from [facility] for iHD 01/17>> transfer to [facility] services 01/18>> developed lower GI bleeding-GI consulted-PRBC transfused 01/18>> transfer back to the ICU 01/20>> colonoscopy with multiple ulcers in colon. 01/24>> transferred to [facility] 2/9>> cleared to ICU secondary to severe metabolic acidosis in the setting of diarrhea, this has resolved, she was transferred back to  progressive on 2/11.   Significant studies: 12/30>> CT chest/abdomen/pelvis: Fluid throughout stomach/small bowel/colon-nonspecific 01/05>> CT chest/abdomen/pelvis: Persistent areas of wall thickening along the colon, patchy bilateral ill-defined lung opacities 01/19>> CT abdomen/pelvis: Diarrheal illness with fluid reaching the rectum, colonic wall thickening is improved.  Improving bilateral PNA.  Bilateral nephrolithiasis without obstruction. 01/19>> CT angio GI bleed: Limited evaluation because of oral contrast within the distal small bowel/throughout colon-active GI bleeding could not be assessed. 01/21>> CT angio GI bleed: No active GI bleeding, multiple infectious/inflammatory colitis involving splenic flexure and ascending colon. 02/9 >> CT abdomen pelvis without contras with no acute findings, with interval improvement of previously demonstrated diffuse colonic wall thickening and surrounding inflammatory changes .  Significant microbiology data: 12/30>> blood culture: Streptococcus Infantarius 12/30>> stool C. difficile negative 12/30>> GI pathogen panel: Negative 01/02>> tracheal aspirate: No organisms 01/03>> blood culture: No growth 01/03>> blood culture: No growth 01/17>> blood culture: No growth   Procedures: 12/31-1/11>> ETT 1/05>> TEE: EF 60-65%-no vegetations 1/20>> colonoscopy: Blood in entire colon, multiple ulcers throughout the colon.   Consults: PCCM GI General surgery Nephrology ID  Septic shock likely due to GI source, required intubation due to septic shock developed ventilator associated pneumonia, Streptococcus Infantarius bacteremia   Sepsis physiology has resolved TEE negative for vegetations Pneumonia has clinically resolved Has finished IV antibiotics for pulmonary issues      Lower GI bleeding due to severe colitis with multiple colon ulcers ischemic versus infectious Acute blood loss anemia Follow CBC trend (8 units PRBC from 1/18-1/21) CT  angio without any localized bleeding See antibiotics above Recent stool studies negative Underwent colonoscopy with biopsies obtained during colonoscopy showing nonspecific inflammation, GI has signed off but thought that infectious versus ischemic colitis is in differential. Gen.surgery following, CT abdomen  from 04/06/2022 noted with continued infectious/inflammatory colitis, case discussed with general surgery, no surgical plans but continue conservative management with bowel rest till abdominal pain much improved,  now tolerating oral Flagyl for her colitis with serial improvement in her symptoms and exam, she was treated for total Flagyl of 10 days, with last day today, no need for further Flagyl at time of discharge, CT abdomen pelvis obtained on 2/9 showing significant improvement of her colitis, no further abdominal pain, she had some diarrhea which has resolved currently . -Hemoglobin has been trending down recently, this is most likely delusional as she received significant IV hydration, hemoglobin has been stable today at 9.3, even though she had Hemoccult positive stool she is most likely in the setting of recent colonic ulcers, but she has normal color stool with no significant bleed.  Marland Kitchenacute toxic/metabolic encephalopathy ICU delirium Multifactorial etiology-ICU delirium/encephalopathy due to sepsis/AKI This problem has resolved Maintain delirium precautions Continue Seroquel 400 nightly Continue vilazodone   AKI Multifactorial etiology-felt to be ischemic ATN from septic shock-possibly some component of infectious (strep) glomerulonephritis-and now possibly contrast related (CT angio)  Avoid nephrotoxic agents Seen by nephrology they have signed off, renal function and creatinine are stable and continues to improve gradually, creatinine is 3 at time of discharge today., needs close outpatient follow-up with nephrology within a week of discharge.   Shock liver Improved Trend LFTs  periodically at SNF   HTN BP stable Continue amlodipine   GERD PPI   Bipolar disorder/depression History of schizophrenia Complicated by encephalopathy Continue Seroquel/vilazodone and nortriptyline   COPD Not in exacerbation-continue bronchodilators   Hypomagnesemia.  Replaced.        Nutrition Status: Nutrition Problem: Inadequate oral intake Etiology: inability to eat Signs/Symptoms: NPO status (on vent) Interventions: MVI   Obesity: Estimated body mass index is 31.2 4 follow with PCP for weight loss     Discharge Diagnoses:  Principal Problem:   Septic shock ([facility]) Active Problems:   AKI (acute kidney injury) ([place])   Bacteremia   Rash   Acute respiratory failure with hypoxia ([facility])   Acute encephalopathy   Hematochezia   Anemia   Lower GI bleed   Anemia, posthemorrhagic, acute   Ulceration, colon   Colitis    Discharge Instructions  Discharge Instructions     Ambulatory Referral for Lung Cancer Scre   Complete by: As directed    Diet - low sodium heart healthy   Complete by: As directed    Discharge instructions   Complete by: As directed    Follow with Primary MD [name], MD in 7 days   Get CBC, CMP, Magnesium -  checked next visit with your primary MD or SNF MD   Activity: As tolerated with Full fall precautions use walker/cane & assistance as needed  Disposition SNF  Diet: Soft heart healthy diet  Special Instructions: If you have smoked or chewed Tobacco  in the last 2 yrs please stop smoking, stop any regular Alcohol  and or any Recreational drug use.  On your next visit with your primary care physician please Get Medicines reviewed and adjusted.  Please request your Prim.MD to go over all Hospital Tests and Procedure/Radiological results at the follow up, please get all Hospital records sent to your Prim MD by signing hospital release before you go home.  If you experience worsening of your admission symptoms, develop  shortness of breath, life threatening emergency, suicidal or homicidal thoughts you must seek medical attention immediately  by calling 911 or calling your MD immediately  if symptoms less severe.  You Must read complete instructions/literature along with all the possible adverse reactions/side effects for all the Medicines you take and that have been prescribed to you. Take any new Medicines after you have completely understood and accpet all the possible adverse reactions/side effects.   Discharge instructions   Complete by: As directed    Follow with Primary MD [name], MD /SNF physician   checked next visit with your primary MD or SNF MD   Activity: As tolerated with Full fall precautions use walker/cane & assistance as needed  Disposition SNF  Diet: Soft heart healthy diet  Special Instructions: If you have smoked or chewed Tobacco  in the last 2 yrs please stop smoking, stop any regular Alcohol  and or any Recreational drug use.  On your next visit with your primary care physician please Get Medicines reviewed and adjusted.  Please request your Prim.MD to go over all Hospital Tests and Procedure/Radiological results at the follow up, please get all Hospital records sent to your Prim MD by signing hospital release before you go home.  If you experience worsening of your admission symptoms, develop shortness of breath, life threatening emergency, suicidal or homicidal thoughts you must seek medical attention immediately by calling 911 or calling your MD immediately  if symptoms less severe.  You Must read complete instructions/literature along with all the possible adverse reactions/side effects for all the Medicines you take and that have been prescribed to you. Take any new Medicines after you have completely understood and accpet all the possible adverse reactions/side effects.   Increase activity slowly   Complete by: As directed    Increase activity slowly   Complete by: As  directed    No wound care   Complete by: As directed    No wound care   Complete by: As directed       Allergies as of 04/23/2022       Reactions   Piper Other (See Comments)   Black pepper Feels like throat is closing, itchy Feels like throat is closing, itchy Feels like throat is closing, itchy   Tape Rash   Paper tape is ok to use.        Medication List     STOP taking these medications    aspirin EC 81 MG tablet   Bydureon BCise 2 MG/0.85ML Auij Generic drug: Exenatide ER   Emgality 120 MG/ML Soaj Generic drug: Galcanezumab-gnlm   estradiol 1 MG tablet Commonly known as: ESTRACE   gabapentin 300 MG capsule Commonly known as: NEURONTIN   meloxicam 7.5 MG tablet Commonly known as: MOBIC   omeprazole 40 MG capsule Commonly known as: PRILOSEC   pregabalin 150 MG capsule Commonly known as: LYRICA   sucralfate 1 g tablet Commonly known as: CARAFATE   valACYclovir 1000 MG tablet Commonly known as: VALTREX       TAKE these medications    (feeding supplement) PROSource Plus liquid Take 30 mLs by mouth 2 (two) times daily between meals.   albuterol 108 (90 Base) MCG/ACT inhaler Commonly known as: Ventolin HFA INHALE 2 PUFFS EVERY FOUR HOURS AS NEEDED What changed:  how much to take how to take this when to take this reasons to take this additional instructions   amLODipine 10 MG tablet Commonly known as: NORVASC Take 1 tablet (10 mg total) by mouth daily.   fluticasone 50 MCG/ACT nasal spray Commonly known as:  FLONASE 1 SPARY IN EACH NOSTRIL DAILY What changed:  how much to take how to take this when to take this additional instructions   lidocaine 5 % Commonly known as: LIDODERM Place 1 patch onto the skin daily. Remove & Discard patch within 12 hours or as directed by MD   loperamide 2 MG capsule Commonly known as: IMODIUM Take 1 capsule (2 mg total) by mouth as needed for diarrhea or loose stools.   multivitamin Tabs  tablet Take 1 tablet by mouth at bedtime.   nortriptyline 25 MG capsule Commonly known as: PAMELOR Take 25 mg by mouth See admin instructions. Take 25 mg (1 capsule) by mouth every morning, and 50 mg (2 capsules) every night at bedtime.   oxyCODONE 5 MG immediate release tablet Commonly known as: Oxy IR/ROXICODONE Take 1 tablet (5 mg total) by mouth every 6 (six) hours as needed for moderate pain or severe pain (68m for moderate pain, 162mfor severe pain).   pantoprazole 40 MG tablet Commonly known as: PROTONIX Take 1 tablet (40 mg total) by mouth daily.   promethazine 25 MG tablet Commonly known as: PHENERGAN Take 1 tablet (25 mg total) by mouth every 6 (six) hours as needed for nausea or vomiting.   QUEtiapine 400 MG tablet Commonly known as: SEROQUEL Take 1 tablet (400 mg total) by mouth at bedtime. What changed:  medication strength how much to take   sodium bicarbonate 650 MG tablet Take 1 tablet (650 mg total) by mouth 2 (two) times daily.   Spiriva HandiHaler 18 MCG inhalation capsule Generic drug: tiotropium INHALE CONTENTS OF ONE CAPSULE ONCE DAILY USING HANDIHALER DEVICE What changed:  how much to take how to take this when to take this additional instructions   Symbicort 160-4.5 MCG/ACT inhaler Generic drug: budesonide-formoterol INHALE 2 PUFFS EVERY MORNING AND EVERY EVENING What changed: See the new instructions.   Vilazodone HCl 40 MG Tabs Commonly known as: VIIBRYD Take 40 mg by mouth daily.        Contact information for follow-up providers     JoBaxter HireMD. Schedule an appointment as soon as possible for a visit in 1 week(s).   Specialty: Internal Medicine Contact information: [id number]. Schedule an appointment as soon as possible for a visit in 1 week(s).   Specialty: Gastroenterology Why: Colitis and GI bleed Contact information: [id number], [place]Schedule an appointment as soon as possible for a visit in 1 week(s).   Specialty: General Surgery Why: Colitis and GI bleed Contact information: [phone]      CoDonato HeinzMD. Schedule an appointment as soon as possible for a visit in 1 week(s).   Specialty: Nephrology Why: CKD 4 Contact information: [address] [phone]            Contact information for after-discharge care     Destination     [facility] .   Service: Skilled Nursing Contact information: [phone]                  Allergies  Allergen Reactions   Piper Other (See Comments)    Black  pepper Feels like throat is closing, itchy Feels like throat is closing, itchy Feels like throat is closing, itchy   Tape Rash    Paper tape is ok to use.       Procedures/Studies: CT ABDOMEN PELVIS WO CONTRAST  Result Date: 04/19/2022 CLINICAL DATA:  Abdominal pain, acute, nonlocalized. Sepsis. History of colitis and renal insufficiency. EXAM: CT ABDOMEN AND PELVIS WITHOUT CONTRAST TECHNIQUE: Multidetector CT imaging of the abdomen and pelvis was performed following the standard protocol without IV contrast. RADIATION DOSE REDUCTION: This exam was performed according to the departmental dose-optimization program which includes automated exposure control, adjustment of the mA and/or kV according to patient size and/or use of iterative reconstruction technique. COMPARISON:  Noncontrast abdominopelvic CT 04/06/2022. Abdominal CTA 03/31/2022. FINDINGS: Despite efforts by the technologist and patient, mild motion artifact is present on today's exam and could not be eliminated. This reduces exam sensitivity and  specificity. Lower chest: Interval mildly improved aeration of the lung bases. No significant pleural or pericardial effusion. Hepatobiliary: The liver appears unremarkable as imaged in the noncontrast state. No evidence of biliary dilatation status post cholecystectomy. Pancreas: Unremarkable. No pancreatic ductal dilatation or surrounding inflammatory changes. Spleen: Normal in size without focal abnormality. Adrenals/Urinary Tract: Both adrenal glands appear normal. Punctate nonobstructing bilateral renal calculi are again noted. No evidence of ureteral calculus, hydronephrosis or perinephric soft tissue stranding. The bladder appears unremarkable for its degree of distention. Stomach/Bowel: No enteric contrast administered. The stomach appears unremarkable for its degree of distention. No small bowel distension, wall thickening or surrounding inflammation. Colonic assessment limited by the lack of enteric and intravenous contrast. The colon is decompressed and partially fluid filled. Previously demonstrated diffuse colonic wall thickening and surrounding inflammatory changes have improved. The appendix appears normal. No evidence of bowel perforation or obstruction. Vascular/Lymphatic: Mild aortic and branch vessel atherosclerosis. No enlarged abdominopelvic lymph nodes identified. Reproductive: Hysterectomy.  No adnexal mass. Other: Small umbilical hernia containing only fat. As above, interval improved pericolonic inflammatory changes. No ascites, pneumoperitoneum or focal extraluminal fluid collection. Improved edema within the subcutaneous tissues. Musculoskeletal: No acute or significant osseous findings. IMPRESSION: 1. No acute findings or explanation for the patient's symptoms. 2. Interval improvement in previously demonstrated diffuse colonic wall thickening and surrounding inflammatory changes consistent with improving colitis. No evidence of bowel perforation or obstruction. 3. Punctate nonobstructing  bilateral renal calculi. 4.  Aortic Atherosclerosis (ICD10-I70.0). Electronically Signed   By: Richardean Sale M.D.   On: 04/19/2022 12:51   CT ABDOMEN PELVIS WO CONTRAST  Result Date: 04/06/2022 CLINICAL DATA:  Abdominal pain and nausea.  Blood in stool. EXAM: CT ABDOMEN AND PELVIS WITHOUT CONTRAST TECHNIQUE: Multidetector CT imaging of the abdomen and pelvis was performed following the standard protocol without IV contrast. RADIATION DOSE REDUCTION: This exam was performed according to the departmental dose-optimization program which includes automated exposure control, adjustment of the mA and/or kV according to patient size and/or use of iterative reconstruction technique. COMPARISON:  03/31/2022 FINDINGS: Lower chest: No acute findings. Hepatobiliary: No mass visualized on this unenhanced exam. Prior cholecystectomy. No evidence of biliary obstruction. Pancreas: No mass or inflammatory process visualized on this unenhanced exam. Spleen:  Within normal limits in size. Adrenals/Urinary tract: Several tiny 1-2 mm bilateral renal calculi again seen. No evidence of ureteral calculi or hydronephrosis. Unremarkable unopacified urinary bladder. Stomach/Bowel: Moderate diffuse colonic wall thickening and pericolonic soft tissue stranding are new since previous study, consistent with diffuse colitis. Normal appendix visualized. Evidence of abscess or free fluid.  Vascular/Lymphatic: No pathologically enlarged lymph nodes identified. No evidence of abdominal aortic aneurysm. Aortic atherosclerotic calcification incidentally noted. Reproductive:  No mass or other significant abnormality. Other:  None. Musculoskeletal:  No suspicious bone lesions identified. IMPRESSION: Moderate diffuse colitis likely infectious in etiology. No evidence of abscess. Bilateral nephrolithiasis. No evidence of ureteral calculi or hydronephrosis. Electronically Signed   By: Marlaine Hind M.D.   On: 04/06/2022 12:51   CT ANGIO GI  BLEED  Result Date: 03/31/2022 CLINICAL DATA:  Lower gastrointestinal hemorrhage EXAM: CTA ABDOMEN AND PELVIS WITHOUT AND WITH CONTRAST TECHNIQUE: Multidetector CT imaging of the abdomen and pelvis was performed using the standard protocol during bolus administration of intravenous contrast. Multiplanar reconstructed images and MIPs were obtained and reviewed to evaluate the vascular anatomy. RADIATION DOSE REDUCTION: This exam was performed according to the departmental dose-optimization program which includes automated exposure control, adjustment of the mA and/or kV according to patient size and/or use of iterative reconstruction technique. CONTRAST:  163m OMNIPAQUE IOHEXOL 350 MG/ML SOLN COMPARISON:  03/29/2022 FINDINGS: VASCULAR Aorta: Normal caliber aorta without aneurysm, dissection, vasculitis or significant stenosis. Mild atherosclerotic calcification Celiac: Variant anatomy with separate origin of the left gastric artery directly from the aorta. No evidence of hemodynamically significant stenosis. No aneurysm or dissection. SMA: Patent without evidence of aneurysm, dissection, vasculitis or significant stenosis. Renals: Both renal arteries are patent without evidence of aneurysm, dissection, vasculitis, fibromuscular dysplasia or significant stenosis. IMA: Patent without evidence of aneurysm, dissection, vasculitis or significant stenosis. Inflow: Patent without evidence of aneurysm, dissection, vasculitis or significant stenosis. Proximal Outflow: Bilateral common femoral and visualized portions of the superficial and profunda femoral arteries are patent without evidence of aneurysm, dissection, vasculitis or significant stenosis. Veins: No obvious venous abnormality within the limitations of this arterial phase study. Review of the MIP images confirms the above findings. NON-VASCULAR Lower chest: Patchy bibasilar ground-glass pulmonary infiltrate, most prevalent within the right middle lobe, is again  identified, likely infectious or inflammatory but incompletely evaluated on this examination. No pleural effusion. Cardiac size is mildly enlarged. Hepatobiliary: No focal liver abnormality is seen. Status post cholecystectomy. No biliary dilatation. Pancreas: Unremarkable Spleen: Unremarkable Adrenals/Urinary Tract: The adrenal glands are unremarkable. The kidneys are normal in size and position. Bilateral punctate nonobstructing renal calculi are identified measuring up to 3 mm. No hydronephrosis. No ureteral calculi. No perinephric inflammatory stranding or fluid collections are seen. The bladder is decompressed with a Foley catheter balloon seen within its lumen. Stomach/Bowel: The colon is diffusely fluid-filled, as is the rectal vault. There is superimposed pericolonic inflammatory stranding and hyperemia involving the splenic flexure of the colon and ascending colon in keeping with changes of multifocal infectious or inflammatory colitis. No evidence of obstruction or perforation. No free intraperitoneal gas. Trace free fluid within the pelvis, unchanged, nonspecific. No active extravasation of contrast identified. The stomach and small bowel are unremarkable. Appendix normal. Lymphatic: No pathologic adenopathy within the abdomen and pelvis. Reproductive: Status post hysterectomy. No adnexal masses. Other: No abdominal wall hernia Musculoskeletal: Osseous structures are age-appropriate. No acute bone abnormality. No lytic or blastic bone lesion. IMPRESSION: 1. No active gastrointestinal hemorrhage identified. 2. Multifocal infectious or inflammatory colitis involving the splenic flexure of the colon and ascending colon. No evidence of obstruction or perforation. 3. Fluid-filled colon and rectal vault in keeping with a diarrheal state. 4. Patchy bibasilar ground-glass pulmonary infiltrate, likely infectious or inflammatory but incompletely evaluated on this examination. 5. Mild bilateral nonobstructing  nephrolithiasis. 6. Aortic atherosclerosis. Aortic Atherosclerosis (  ICD10-I70.0). Electronically Signed   By: Fidela Salisbury M.D.   On: 03/31/2022 03:17   DG Abd Portable 1V  Result Date: 03/30/2022 CLINICAL DATA:  OG tube placement EXAM: PORTABLE ABDOMEN - 1 VIEW COMPARISON:  CTA GI bleed 03/29/2022 FINDINGS: PICC line tip projects over the superior vena cava. Enteric tube tip projects over the left upper quadrant. Cholecystectomy clips. Gaseous distended loops of bowel throughout the visualized upper abdomen. IMPRESSION: Enteric tube tip projects over the left upper quadrant. Electronically Signed   By: Lovey Newcomer M.D.   On: 03/30/2022 17:11   CT ANGIO GI BLEED  Result Date: 03/29/2022 CLINICAL DATA:  57 year old female with lower GI bleed. EXAM: CTA ABDOMEN AND PELVIS WITHOUT AND WITH CONTRAST TECHNIQUE: Multidetector CT imaging of the abdomen and pelvis was performed using the standard protocol during bolus administration of intravenous contrast. Multiplanar reconstructed images and MIPs were obtained and reviewed to evaluate the vascular anatomy. RADIATION DOSE REDUCTION: This exam was performed according to the departmental dose-optimization program which includes automated exposure control, adjustment of the mA and/or kV according to patient size and/or use of iterative reconstruction technique. CONTRAST:  60m OMNIPAQUE IOHEXOL 350 MG/ML SOLN COMPARISON:  CT abdomen and pelvis with oral contrast on 03/29/2022 and CT from 03/15/2022 FINDINGS: VASCULAR Aorta: Minimal atherosclerotic disease in the abdominal aorta without aneurysm, dissection or significant stenosis. Celiac: Patent without evidence of aneurysm, dissection, vasculitis or significant stenosis. SMA: Patent without evidence of aneurysm, dissection, vasculitis or significant stenosis. Renals: Both renal arteries are patent without evidence of aneurysm, dissection, vasculitis, fibromuscular dysplasia or significant stenosis. IMA: Patent  without evidence of aneurysm, dissection, vasculitis or significant stenosis. Inflow: Patent without evidence of aneurysm, dissection, vasculitis or significant stenosis. Proximal Outflow: Proximal femoral arteries are patent bilaterally. Veins: Portal venous system is patent. IVC and renal veins are patent. Main iliac veins are patent. Proximal femoral veins are patent. Review of the MIP images confirms the above findings. NON-VASCULAR Lower chest: Residual patchy densities in the right middle lobe. No pleural effusions. Hepatobiliary: Cholecystectomy. Normal appearance of the liver without biliary dilatation. Pancreas: Unremarkable. No pancreatic ductal dilatation or surrounding inflammatory changes. Spleen: Normal in size without focal abnormality. Adrenals/Urinary Tract: Normal adrenal glands. At least 2 small left renal calculi, largest measures 2 mm. 3 mm calculus in the right kidney the upper/mid pole region. Probable cyst in the right kidney lower pole that does not require dedicated follow-up. Questionable 6 mm fat attenuating structure along the right kidney upper pole on image 44/5. This could be related to a cortical scar/cleft versus a small fat containing structure such as a tiny angiomyolipoma. This is not significantly changed since 2011 and does not require dedicated follow-up. No hydronephrosis. Urinary bladder is decompressed with a Foley catheter. No ureter dilatation. Stomach/Bowel: Nasogastric tube terminates in the stomach body. Limited evaluation for active GI bleeding because there is oral contrast in the small and large bowel. Mild focal wall thickening in the colon near the splenic flexure on image 53/54. Mild pericolonic stranding involving the ascending colon. There is contrast within the normal appearing appendix. Contrast in the distal small bowel without bowel dilatation or obstruction. Normal appearance of the stomach. Lymphatic: Again noted are small periaortic lymph nodes in the  abdomen. No significant lymph node enlargement in the abdomen or pelvis. Reproductive: Uterus appears to be surgically absent. No evidence for an adnexal mass. Other: Diffuse subcutaneous edema. Trace fluid in the pelvis and perihepatic region. Negative for free air. Musculoskeletal: No  acute bone abnormality. Scattered subcutaneous densities most likely related to prior injection sites. IMPRESSION: VASCULAR 1. Minimal atherosclerotic disease in the abdomen or pelvis. Aorta and main visceral arteries are patent without significant stenosis. NON-VASCULAR 1. Very limited evaluation for active GI bleeding because there was oral contrast within the distal small bowel and throughout the colon. Active GI bleeding cannot be assessed on this examination. 2. Residual mild colonic wall thickening most prominent near the splenic fracture. There may be mild colonic wall thickening in the right colon. Findings are compatible with residual or persistent colitis. 3. Nonobstructive renal calculi. 4. Diffuse subcutaneous edema. Electronically Signed   By: Markus Daft M.D.   On: 03/29/2022 14:19   CT ABDOMEN PELVIS WO CONTRAST  Result Date: 03/29/2022 CLINICAL DATA:  Acute, nonlocalized abdominal pain EXAM: CT ABDOMEN AND PELVIS WITHOUT CONTRAST TECHNIQUE: Multidetector CT imaging of the abdomen and pelvis was performed following the standard protocol without IV contrast. RADIATION DOSE REDUCTION: This exam was performed according to the departmental dose-optimization program which includes automated exposure control, adjustment of the mA and/or kV according to patient size and/or use of iterative reconstruction technique. COMPARISON:  03/15/2022 FINDINGS: Lower chest: Patchy infiltrate in the lower lungs is likely improved. Hepatobiliary: No focal liver abnormality.Cholecystectomy without bile duct dilatation. Pancreas: Unremarkable. Spleen: Unremarkable. Adrenals/Urinary Tract: Negative adrenals. No hydronephrosis or ureteral  stone. At least 2 renal calculi on both sides measuring up to 2 mm. The bladder is collapsed by Foley catheter. Stomach/Bowel: Diffuse colonic fluid levels reaching the rectum. Colonic wall thickening is improved. No bowel obstruction and no small bowel dilatation to imply ileus. An enteric tube tip is at the stomach. Vascular/Lymphatic: No acute vascular abnormality. Scattered atheromatous calcification of the aorta and iliacs. Reproductive:No acute finding Other: Decrease in pelvic fluid, essentially resolved. Musculoskeletal: No acute or aggressive finding. IMPRESSION: 1. Diarrheal illness with fluid reaching the rectum. Colitic wall thickening is improved from prior. 2. Improving bilateral pneumonia. 3. Bilateral nephrolithiasis without obstruction. Electronically Signed   By: Jorje Guild M.D.   On: 03/29/2022 07:11   DG Chest Port 1 View  Result Date: 03/25/2022 CLINICAL DATA:  Respiratory failure. EXAM: PORTABLE CHEST 1 VIEW COMPARISON:  03/22/2022 FINDINGS: Nasogastric tube has been removed. Feeding tube has been placed, tip beyond the edge of the film at least to the level of the mid stomach. RIGHT-sided PICC line tip overlies the superior vena. Patchy opacities throughout the lungs similar to prior study. IMPRESSION: Feeding tube tip beyond the edge of the film at least to the level of the mid stomach. Electronically Signed   By: Nolon Nations M.D.   On: 03/25/2022 07:30      Subjective: No significant events overnight, she had BM x 2 yesterday, light brown in color, with no bleed  Discharge Exam: Vitals:   04/23/22 0821 04/23/22 1240  BP: (!) 144/87   Pulse: (!) 107 (!) 105  Resp: 16 15  Temp: 97.6 F (36.4 C) 98.5 F (36.9 C)  SpO2: 96% 97%   Vitals:   04/23/22 0546 04/23/22 0742 04/23/22 0821 04/23/22 1240  BP:   (!) 144/87   Pulse: 96  (!) 107 (!) 105  Resp: 14  16 15  $ Temp: 98.4 F (36.9 C)  97.6 F (36.4 C) 98.5 F (36.9 C)  TempSrc: Oral  Oral Oral  SpO2: 97%  99% 96% 97%  Weight:      Height:        General: Pt is alert, awake, not  in acute distress, FRAIL Cardiovascular: RRR, S1/S2 +, no rubs, no gallops Respiratory: CTA bilaterally, no wheezing, no rhonchi Abdominal: Soft, NT, ND, bowel sounds + Extremities: no edema, no cyanosis    The results of significant diagnostics from this hospitalization (including imaging, microbiology, ancillary and laboratory) are listed below for reference.     Microbiology: No results found for this or any previous visit (from the past 240 hour(s)).   Labs: BNP (last 3 results) Recent Labs    04/08/22 0414 04/09/22 0449 04/11/22 0229  BNP 43.6 40.1 [id number]   Basic Metabolic Panel: Recent Labs  Lab 04/19/22 0904 04/20/22 0211 04/21/22 0044 04/22/22 0416 04/23/22 1029  NA 136 136 137 140 138  K 4.7 3.8 3.5 3.9 4.2  CL 107 101 96* 99 99  CO2 <7* 13* 21* 22 22  GLUCOSE 119* 119* 121* 88 111*  BUN 28* 28* 27* 24* 24*  CREATININE 4.19* 3.79* 3.24* 3.07* 3.09*  CALCIUM 9.5 8.1* 7.6* 8.2* 8.4*  PHOS  --  3.7 3.4 3.7  --    Liver Function Tests: Recent Labs  Lab 04/20/22 0211 04/21/22 0044 04/22/22 0416 04/23/22 1029  AST  --   --   --  28  ALT  --   --   --  23  ALKPHOS  --   --   --  422*  BILITOT  --   --   --  0.6  PROT  --   --   --  5.8*  ALBUMIN 1.6* <1.5* 2.2* 2.5*   No results for input(s): "LIPASE", "AMYLASE" in the last 168 hours. No results for input(s): "AMMONIA" in the last 168 hours. CBC: Recent Labs  Lab 04/20/22 0210 04/21/22 0044 04/22/22 0416 04/22/22 1143 04/23/22 1029  WBC 8.8 8.1 7.0 8.4 9.1  HGB 9.4* 8.7* 7.2* 7.9* 9.3*  HCT 26.7* 24.7* 22.0* 23.2* 27.2*  MCV 87.8 89.5 92.1 88.2 89.5  PLT 464* 447* 408* 400 442*   Cardiac Enzymes: No results for input(s): "CKTOTAL", "CKMB", "CKMBINDEX", "TROPONINI" in the last 168 hours. BNP: Invalid input(s): "POCBNP" CBG: Recent Labs  Lab 04/19/22 1517 04/23/22 1008 04/23/22 1244  GLUCAP 96 115* 105*    D-Dimer No results for input(s): "DDIMER" in the last 72 hours. Hgb A1c No results for input(s): "HGBA1C" in the last 72 hours. Lipid Profile No results for input(s): "CHOL", "HDL", "LDLCALC", "TRIG", "CHOLHDL", "LDLDIRECT" in the last 72 hours. Thyroid function studies No results for input(s): "TSH", "T4TOTAL", "T3FREE", "THYROIDAB" in the last 72 hours.  Invalid input(s): "FREET3" Anemia work up No results for input(s): "VITAMINB12", "FOLATE", "FERRITIN", "TIBC", "IRON", "RETICCTPCT" in the last 72 hours. Urinalysis    Component Value Date/Time   COLORURINE YELLOW 04/19/2022 2129   APPEARANCEUR CLEAR 04/19/2022 2129   APPEARANCEUR Clear 12/25/2021 1409   LABSPEC 1.013 04/19/2022 2129   LABSPEC 1.010 04/20/2012 1725   PHURINE 5.0 04/19/2022 2129   GLUCOSEU NEGATIVE 04/19/2022 2129   GLUCOSEU Negative 04/20/2012 1725   HGBUR NEGATIVE 04/19/2022 2129   BILIRUBINUR MODERATE (A) 04/19/2022 2129   BILIRUBINUR Negative 12/25/2021 1409   BILIRUBINUR Negative 04/20/2012 1725   KETONESUR 5 (A) 04/19/2022 2129   PROTEINUR 30 (A) 04/19/2022 2129   NITRITE NEGATIVE 04/19/2022 2129   LEUKOCYTESUR TRACE (A) 04/19/2022 2129   LEUKOCYTESUR Negative 04/20/2012 1725   Sepsis Labs Recent Labs  Lab 04/21/22 0044 04/22/22 0416 04/22/22 1143 04/23/22 1029  WBC 8.1 7.0 8.4 9.1   Microbiology No results found for this or any  previous visit (from the past 240 hour(s)).   Time coordinating discharge: Over 30 minutes  SIGNED:   Phillips Climes, MD  [facility] 04/23/2022, 1:37 PM Pager

## 2022-05-28 ENCOUNTER — Emergency Department: Payer: Medicaid Other

## 2022-05-28 ENCOUNTER — Encounter: Payer: Self-pay | Admitting: Emergency Medicine

## 2022-05-28 ENCOUNTER — Other Ambulatory Visit: Payer: Self-pay

## 2022-05-28 ENCOUNTER — Inpatient Hospital Stay
Admission: EM | Admit: 2022-05-28 | Discharge: 2022-06-01 | DRG: 372 | Disposition: A | Discharge status: Home / Self Care | Payer: Medicaid Other | Attending: Internal Medicine | Admitting: Internal Medicine

## 2022-05-28 DIAGNOSIS — E876 Hypokalemia: Secondary | ICD-10-CM | POA: Diagnosis present

## 2022-05-28 DIAGNOSIS — Z825 Family history of asthma and other chronic lower respiratory diseases: Secondary | ICD-10-CM

## 2022-05-28 DIAGNOSIS — E1122 Type 2 diabetes mellitus with diabetic chronic kidney disease: Secondary | ICD-10-CM | POA: Diagnosis present

## 2022-05-28 DIAGNOSIS — Z6828 Body mass index (BMI) 28.0-28.9, adult: Secondary | ICD-10-CM | POA: Diagnosis not present

## 2022-05-28 DIAGNOSIS — K219 Gastro-esophageal reflux disease without esophagitis: Secondary | ICD-10-CM | POA: Diagnosis present

## 2022-05-28 DIAGNOSIS — Z720 Tobacco use: Secondary | ICD-10-CM | POA: Diagnosis present

## 2022-05-28 DIAGNOSIS — D631 Anemia in chronic kidney disease: Secondary | ICD-10-CM | POA: Diagnosis present

## 2022-05-28 DIAGNOSIS — Z8249 Family history of ischemic heart disease and other diseases of the circulatory system: Secondary | ICD-10-CM

## 2022-05-28 DIAGNOSIS — Z809 Family history of malignant neoplasm, unspecified: Secondary | ICD-10-CM

## 2022-05-28 DIAGNOSIS — R6510 Systemic inflammatory response syndrome (SIRS) of non-infectious origin without acute organ dysfunction: Secondary | ICD-10-CM | POA: Diagnosis present

## 2022-05-28 DIAGNOSIS — A0472 Enterocolitis due to Clostridium difficile, not specified as recurrent: Principal | ICD-10-CM | POA: Diagnosis present

## 2022-05-28 DIAGNOSIS — Z7951 Long term (current) use of inhaled steroids: Secondary | ICD-10-CM

## 2022-05-28 DIAGNOSIS — Z79899 Other long term (current) drug therapy: Secondary | ICD-10-CM

## 2022-05-28 DIAGNOSIS — L600 Ingrowing nail: Secondary | ICD-10-CM | POA: Diagnosis present

## 2022-05-28 DIAGNOSIS — E871 Hypo-osmolality and hyponatremia: Secondary | ICD-10-CM | POA: Diagnosis present

## 2022-05-28 DIAGNOSIS — G4700 Insomnia, unspecified: Secondary | ICD-10-CM | POA: Diagnosis present

## 2022-05-28 DIAGNOSIS — D75839 Thrombocytosis, unspecified: Secondary | ICD-10-CM | POA: Diagnosis present

## 2022-05-28 DIAGNOSIS — I10 Essential (primary) hypertension: Secondary | ICD-10-CM | POA: Diagnosis not present

## 2022-05-28 DIAGNOSIS — A419 Sepsis, unspecified organism: Secondary | ICD-10-CM

## 2022-05-28 DIAGNOSIS — N184 Chronic kidney disease, stage 4 (severe): Secondary | ICD-10-CM | POA: Diagnosis present

## 2022-05-28 DIAGNOSIS — G4733 Obstructive sleep apnea (adult) (pediatric): Secondary | ICD-10-CM | POA: Diagnosis present

## 2022-05-28 DIAGNOSIS — D6489 Other specified anemias: Secondary | ICD-10-CM

## 2022-05-28 DIAGNOSIS — R197 Diarrhea, unspecified: Secondary | ICD-10-CM

## 2022-05-28 DIAGNOSIS — F17210 Nicotine dependence, cigarettes, uncomplicated: Secondary | ICD-10-CM | POA: Diagnosis present

## 2022-05-28 DIAGNOSIS — Z9102 Food additives allergy status: Secondary | ICD-10-CM | POA: Diagnosis not present

## 2022-05-28 DIAGNOSIS — E860 Dehydration: Secondary | ICD-10-CM | POA: Diagnosis present

## 2022-05-28 DIAGNOSIS — B351 Tinea unguium: Secondary | ICD-10-CM | POA: Diagnosis present

## 2022-05-28 DIAGNOSIS — I129 Hypertensive chronic kidney disease with stage 1 through stage 4 chronic kidney disease, or unspecified chronic kidney disease: Secondary | ICD-10-CM | POA: Diagnosis present

## 2022-05-28 DIAGNOSIS — Z807 Family history of other malignant neoplasms of lymphoid, hematopoietic and related tissues: Secondary | ICD-10-CM

## 2022-05-28 DIAGNOSIS — F319 Bipolar disorder, unspecified: Secondary | ICD-10-CM

## 2022-05-28 DIAGNOSIS — F17200 Nicotine dependence, unspecified, uncomplicated: Secondary | ICD-10-CM | POA: Diagnosis not present

## 2022-05-28 DIAGNOSIS — D649 Anemia, unspecified: Secondary | ICD-10-CM | POA: Diagnosis not present

## 2022-05-28 DIAGNOSIS — F209 Schizophrenia, unspecified: Secondary | ICD-10-CM | POA: Diagnosis present

## 2022-05-28 DIAGNOSIS — J449 Chronic obstructive pulmonary disease, unspecified: Secondary | ICD-10-CM | POA: Diagnosis present

## 2022-05-28 DIAGNOSIS — K529 Noninfective gastroenteritis and colitis, unspecified: Principal | ICD-10-CM

## 2022-05-28 DIAGNOSIS — E440 Moderate protein-calorie malnutrition: Secondary | ICD-10-CM | POA: Diagnosis present

## 2022-05-28 DIAGNOSIS — Z91048 Other nonmedicinal substance allergy status: Secondary | ICD-10-CM

## 2022-05-28 DIAGNOSIS — F3130 Bipolar disorder, current episode depressed, mild or moderate severity, unspecified: Secondary | ICD-10-CM | POA: Diagnosis present

## 2022-05-28 DIAGNOSIS — R000 Tachycardia, unspecified: Secondary | ICD-10-CM | POA: Diagnosis not present

## 2022-05-28 DIAGNOSIS — Z820 Family history of epilepsy and other diseases of the nervous system: Secondary | ICD-10-CM

## 2022-05-28 DIAGNOSIS — Z833 Family history of diabetes mellitus: Secondary | ICD-10-CM

## 2022-05-28 LAB — D-DIMER, QUANTITATIVE: D-Dimer, Quant: 1.3 ug/mL-FEU — ABNORMAL HIGH (ref 0.00–0.50)

## 2022-05-28 LAB — CBC WITH DIFFERENTIAL/PLATELET
Abs Immature Granulocytes: 0.34 10*3/uL — ABNORMAL HIGH (ref 0.00–0.07)
Basophils Absolute: 0.1 10*3/uL (ref 0.0–0.1)
Basophils Relative: 0 %
Eosinophils Absolute: 0.1 10*3/uL (ref 0.0–0.5)
Eosinophils Relative: 0 %
HCT: 23.8 % — ABNORMAL LOW (ref 36.0–46.0)
Hemoglobin: 7.8 g/dL — ABNORMAL LOW (ref 12.0–15.0)
Immature Granulocytes: 1 %
Lymphocytes Relative: 7 %
Lymphs Abs: 2.2 10*3/uL (ref 0.7–4.0)
MCH: 29.8 pg (ref 26.0–34.0)
MCHC: 32.8 g/dL (ref 30.0–36.0)
MCV: 90.8 fL (ref 80.0–100.0)
Monocytes Absolute: 1.4 10*3/uL — ABNORMAL HIGH (ref 0.1–1.0)
Monocytes Relative: 5 %
Neutro Abs: 26.1 10*3/uL — ABNORMAL HIGH (ref 1.7–7.7)
Neutrophils Relative %: 87 %
Platelets: 773 10*3/uL — ABNORMAL HIGH (ref 150–400)
RBC: 2.62 MIL/uL — ABNORMAL LOW (ref 3.87–5.11)
RDW: 18 % — ABNORMAL HIGH (ref 11.5–15.5)
WBC: 30.2 10*3/uL — ABNORMAL HIGH (ref 4.0–10.5)
nRBC: 0 % (ref 0.0–0.2)

## 2022-05-28 LAB — IRON AND TIBC
Iron: 12 ug/dL — ABNORMAL LOW (ref 28–170)
Saturation Ratios: 12 % (ref 10.4–31.8)
TIBC: 98 ug/dL — ABNORMAL LOW (ref 250–450)
UIBC: 86 ug/dL

## 2022-05-28 LAB — FERRITIN: Ferritin: 401 ng/mL — ABNORMAL HIGH (ref 11–307)

## 2022-05-28 LAB — RETICULOCYTES
Immature Retic Fract: 31.1 % — ABNORMAL HIGH (ref 2.3–15.9)
RBC.: 2.26 MIL/uL — ABNORMAL LOW (ref 3.87–5.11)
Retic Count, Absolute: 75.7 10*3/uL (ref 19.0–186.0)
Retic Ct Pct: 3.4 % — ABNORMAL HIGH (ref 0.4–3.1)

## 2022-05-28 LAB — PHOSPHORUS: Phosphorus: 3.6 mg/dL (ref 2.5–4.6)

## 2022-05-28 LAB — COMPREHENSIVE METABOLIC PANEL
ALT: 12 U/L (ref 0–44)
AST: 14 U/L — ABNORMAL LOW (ref 15–41)
Albumin: 2.3 g/dL — ABNORMAL LOW (ref 3.5–5.0)
Alkaline Phosphatase: 147 U/L — ABNORMAL HIGH (ref 38–126)
Anion gap: 12 (ref 5–15)
BUN: 29 mg/dL — ABNORMAL HIGH (ref 6–20)
CO2: 17 mmol/L — ABNORMAL LOW (ref 22–32)
Calcium: 9.3 mg/dL (ref 8.9–10.3)
Chloride: 99 mmol/L (ref 98–111)
Creatinine, Ser: 2.5 mg/dL — ABNORMAL HIGH (ref 0.44–1.00)
GFR, Estimated: 22 mL/min — ABNORMAL LOW (ref >60)
Glucose, Bld: 123 mg/dL — ABNORMAL HIGH (ref 70–99)
Potassium: 3.4 mmol/L — ABNORMAL LOW (ref 3.5–5.1)
Sodium: 128 mmol/L — ABNORMAL LOW (ref 135–145)
Total Bilirubin: 0.5 mg/dL (ref 0.3–1.2)
Total Protein: 6.3 g/dL — ABNORMAL LOW (ref 6.5–8.1)

## 2022-05-28 LAB — LACTIC ACID, PLASMA: Lactic Acid, Venous: 1 mmol/L (ref 0.5–1.9)

## 2022-05-28 LAB — MAGNESIUM: Magnesium: 1.6 mg/dL — ABNORMAL LOW (ref 1.7–2.4)

## 2022-05-28 LAB — FOLATE: Folate: 15.4 ng/mL (ref >5.9)

## 2022-05-28 MED ORDER — NORTRIPTYLINE HCL 25 MG PO CAPS
50.0000 mg | ORAL_CAPSULE | Freq: Every day | ORAL | Status: DC
  Administered 2022-05-28 – 2022-05-31 (×4): 50 mg via ORAL
  Filled 2022-05-28 (×4): qty 2

## 2022-05-28 MED ORDER — SODIUM CHLORIDE 0.9 % IV SOLN
2.0000 g | Freq: Once | INTRAVENOUS | Status: AC
  Administered 2022-05-28: 2 g via INTRAVENOUS
  Filled 2022-05-28: qty 12.5

## 2022-05-28 MED ORDER — SODIUM BICARBONATE 650 MG PO TABS
650.0000 mg | ORAL_TABLET | Freq: Two times a day (BID) | ORAL | Status: DC
  Administered 2022-05-28 – 2022-06-01 (×8): 650 mg via ORAL
  Filled 2022-05-28 (×8): qty 1

## 2022-05-28 MED ORDER — SODIUM CHLORIDE 0.9 % IV BOLUS
1000.0000 mL | Freq: Once | INTRAVENOUS | Status: AC
  Administered 2022-05-28: 1000 mL via INTRAVENOUS

## 2022-05-28 MED ORDER — PANTOPRAZOLE 80MG IVPB - SIMPLE MED
80.0000 mg | Freq: Two times a day (BID) | INTRAVENOUS | Status: DC
  Administered 2022-05-28 – 2022-05-30 (×4): 80 mg via INTRAVENOUS
  Filled 2022-05-28 (×4): qty 100

## 2022-05-28 MED ORDER — METRONIDAZOLE 500 MG/100ML IV SOLN
500.0000 mg | Freq: Once | INTRAVENOUS | Status: DC
  Filled 2022-05-28: qty 100

## 2022-05-28 MED ORDER — LACTATED RINGERS IV SOLN
150.0000 mL/h | INTRAVENOUS | Status: DC
  Administered 2022-05-28 – 2022-05-29 (×2): 150 mL/h via INTRAVENOUS

## 2022-05-28 MED ORDER — ACETAMINOPHEN 650 MG RE SUPP: 650.0000 mg | Freq: Four times a day (QID) | RECTAL | Status: DC | PRN

## 2022-05-28 MED ORDER — ACETAMINOPHEN 325 MG PO TABS
650.0000 mg | ORAL_TABLET | Freq: Four times a day (QID) | ORAL | Status: DC | PRN
  Filled 2022-05-28: qty 2

## 2022-05-28 MED ORDER — ONDANSETRON HCL 4 MG/2ML IJ SOLN
4.0000 mg | Freq: Once | INTRAMUSCULAR | Status: AC
  Administered 2022-05-28: 4 mg via INTRAVENOUS
  Filled 2022-05-28: qty 2

## 2022-05-28 MED ORDER — ONDANSETRON HCL 4 MG/2ML IJ SOLN
4.0000 mg | Freq: Four times a day (QID) | INTRAMUSCULAR | Status: DC | PRN
  Administered 2022-05-28 – 2022-05-30 (×4): 4 mg via INTRAVENOUS
  Filled 2022-05-28 (×4): qty 2

## 2022-05-28 MED ORDER — MAGNESIUM SULFATE IN D5W 1-5 GM/100ML-% IV SOLN
1.0000 g | Freq: Once | INTRAVENOUS | Status: AC
  Administered 2022-05-28: 1 g via INTRAVENOUS
  Filled 2022-05-28: qty 100

## 2022-05-28 MED ORDER — LACTATED RINGERS IV BOLUS (SEPSIS)
500.0000 mL | Freq: Once | INTRAVENOUS | Status: AC
  Administered 2022-05-28: 500 mL via INTRAVENOUS

## 2022-05-28 MED ORDER — VILAZODONE HCL 20 MG PO TABS
40.0000 mg | ORAL_TABLET | Freq: Every day | ORAL | Status: DC
  Administered 2022-05-29 – 2022-05-31 (×3): 40 mg via ORAL
  Filled 2022-05-28 (×5): qty 2

## 2022-05-28 MED ORDER — METRONIDAZOLE 500 MG/100ML IV SOLN
500.0000 mg | Freq: Two times a day (BID) | INTRAVENOUS | Status: DC
  Administered 2022-05-28 – 2022-05-29 (×2): 500 mg via INTRAVENOUS
  Filled 2022-05-28: qty 100

## 2022-05-28 MED ORDER — QUETIAPINE FUMARATE 300 MG PO TABS
300.0000 mg | ORAL_TABLET | Freq: Every day | ORAL | Status: DC
  Administered 2022-05-28 – 2022-05-31 (×4): 300 mg via ORAL
  Filled 2022-05-28 (×4): qty 1

## 2022-05-28 MED ORDER — METOPROLOL TARTRATE 5 MG/5ML IV SOLN: 5.0000 mg | INTRAVENOUS | Status: DC | PRN

## 2022-05-28 MED ORDER — ALBUTEROL SULFATE (2.5 MG/3ML) 0.083% IN NEBU: 3.0000 mL | INHALATION_SOLUTION | RESPIRATORY_TRACT | Status: DC | PRN

## 2022-05-28 MED ORDER — POTASSIUM CITRATE-CITRIC ACID 1100-334 MG/5ML PO SOLN
10.0000 meq | Freq: Once | ORAL | Status: AC
  Administered 2022-05-28: 10 meq via ORAL
  Filled 2022-05-28: qty 5

## 2022-05-28 MED ORDER — NICOTINE 21 MG/24HR TD PT24: 21.0000 mg | MEDICATED_PATCH | Freq: Every day | TRANSDERMAL | Status: DC | PRN

## 2022-05-28 MED ORDER — AMLODIPINE BESYLATE 10 MG PO TABS: 10.0000 mg | ORAL_TABLET | Freq: Every day | ORAL | Status: DC

## 2022-05-28 MED ORDER — NORTRIPTYLINE HCL 25 MG PO CAPS: 25.0000 mg | ORAL_CAPSULE | ORAL | Status: DC

## 2022-05-28 MED ORDER — TRAZODONE HCL 50 MG PO TABS
50.0000 mg | ORAL_TABLET | Freq: Every day | ORAL | Status: DC
  Administered 2022-05-28 – 2022-05-31 (×4): 50 mg via ORAL
  Filled 2022-05-28 (×4): qty 1

## 2022-05-28 MED ORDER — VANCOMYCIN HCL 1500 MG/300ML IV SOLN
1500.0000 mg | Freq: Once | INTRAVENOUS | Status: AC
  Administered 2022-05-28: 1500 mg via INTRAVENOUS
  Filled 2022-05-28: qty 300

## 2022-05-28 MED ORDER — FLUTICASONE PROPIONATE 50 MCG/ACT NA SUSP: 1.0000 | Freq: Every day | NASAL | Status: DC | PRN

## 2022-05-28 MED ORDER — SODIUM CHLORIDE 0.9 % IV SOLN: 2.0000 g | INTRAVENOUS | Status: DC

## 2022-05-28 MED ORDER — ONDANSETRON HCL 4 MG PO TABS: 4.0000 mg | ORAL_TABLET | Freq: Four times a day (QID) | ORAL | Status: DC | PRN

## 2022-05-28 MED ORDER — FENTANYL CITRATE PF 50 MCG/ML IJ SOSY
50.0000 ug | PREFILLED_SYRINGE | Freq: Once | INTRAMUSCULAR | Status: AC
  Administered 2022-05-28: 50 ug via INTRAVENOUS
  Filled 2022-05-28: qty 1

## 2022-05-28 MED ORDER — NORTRIPTYLINE HCL 25 MG PO CAPS
25.0000 mg | ORAL_CAPSULE | Freq: Every day | ORAL | Status: DC
  Administered 2022-05-29 – 2022-06-01 (×4): 25 mg via ORAL
  Filled 2022-05-28 (×4): qty 1

## 2022-05-28 NOTE — Hospital Course (Signed)
[name] is a 57 year old female with history of CKD stage IV, bipolar disorder, depression, history of schizophrenia, COPD, GERD, hypertension, stage I obesity, with recent prolonged hospitalization at [facility] in [place] for multiple diagnoses including septic shock, respiratory failure, upper GI bleed, requiring pressors and intubation, and then Streptococcus bacteremia, developing acute kidney injury requiring CRRT treatment.  Vitals in the ED showed temperature 98.9, respiration rate 20, heart rate of 123, blood pressure 106/66, SpO2 100% on room air.  Serum sodium is 128, potassium 2.4, chloride 99, bicarb 17, BUN of 29, serum creatinine of 2.50, nonfasting blood glucose 123, GFR of 22, WBC 30.2, hemoglobin 7.8, platelets of 773.  Lactic acid is 1.0.  ED treatment: Fentanyl 50 mcg, ondansetron 4 mg IV one-time dose, cefepime 2 g IV, metronidazole 500 mg IV, sodium chloride 1 L bolus.

## 2022-05-28 NOTE — ED Provider Notes (Signed)
[facility] Provider Note    Event Date/Time   First MD Initiated Contact with Patient 05/28/22 1511     (approximate)   History   Abdominal Pain   HPI  Darlene Barber is a 57 y.o. female with a history of osteoarthritis, GERD, hypertension, morbid obesity, type 2 diabetes, migraines, OSA, and depression, as well as recent hospitalization for sepsis and GI bleed who presents with persistent abdominal pain, nausea, diarrhea, and generalized weakness over the last several weeks, worsened over the last 3 to 4 days.  The patient states that she has had persistent abdominal pain since being discharged from the hospital last month mostly in her lower abdomen on both sides but sometimes in the upper abdomen as well.  It is described as crampy.  She has associated nausea and frequent gagging and dry heaving.  She also reports frequent diarrhea.  She denies any blood in the stool since she left the hospital.  He reports feeling lightheaded and dizzy over the last several days.  She denies any cough or shortness of breath.  She has not had no fevers.  She denies any urinary symptoms.  I reviewed the past medical records.  The patient had a recent prolonged and complicated hospitalization between 03/09/2022 and 04/23/2022 initially presenting for septic shock and respiratory failure requiring pressors and intubation.  She was found to have Streptococcus bacteremia and subsequently had a CI, metabolic encephalopathy, and lower GI bleed found to be due to ulcers in her colon.     Physical Exam   Triage Vital Signs: ED Triage Vitals  Enc Vitals Group     BP 05/28/22 1418 106/66     Pulse Rate 05/28/22 1418 (!) 123     Resp 05/28/22 1418 20     Temp 05/28/22 1418 98.9 F (37.2 C)     Temp Source 05/28/22 1418 Oral     SpO2 05/28/22 1418 100 %     Weight 05/28/22 1420 135 lb (61.2 kg)     Height 05/28/22 1420 4\' 10"  (1.473 m)     Head Circumference --      Peak Flow --       Pain Score 05/28/22 1420 9     Pain Loc --      Pain Edu? --      Excl. in [place]? --     Most recent vital signs: Vitals:   05/28/22 1418 05/28/22 1512  BP: 106/66 112/72  Pulse: (!) 123 (!) 119  Resp: 20 13  Temp: 98.9 F (37.2 C)   SpO2: 100% 92%     General: Alert and oriented, weak appearing but in no acute distress. CV:  Good peripheral perfusion.  Resp:  Normal effort.  Abd:  Soft with moderate bilateral lower quadrant tenderness and mild upper abdominal tenderness.  No peritoneal signs.  No distention.  Other:  Dry mucous membranes.  No jaundice or scleral icterus.  No peripheral edema.   ED Results / Procedures / Treatments   Labs (all labs ordered are listed, but only abnormal results are displayed) Labs Reviewed  COMPREHENSIVE METABOLIC PANEL - Abnormal; Notable for the following components:      Result Value   Sodium 128 (*)    Potassium 3.4 (*)    CO2 17 (*)    Glucose, Bld 123 (*)    BUN 29 (*)    Creatinine, Ser 2.50 (*)    Total Protein 6.3 (*)    Albumin  2.3 (*)    AST 14 (*)    Alkaline Phosphatase 147 (*)    GFR, Estimated 22 (*)    All other components within normal limits  CBC WITH DIFFERENTIAL/PLATELET - Abnormal; Notable for the following components:   WBC 30.2 (*)    RBC 2.62 (*)    Hemoglobin 7.8 (*)    HCT 23.8 (*)    RDW 18.0 (*)    Platelets 773 (*)    Neutro Abs 26.1 (*)    Monocytes Absolute 1.4 (*)    Abs Immature Granulocytes 0.34 (*)    All other components within normal limits  CULTURE, BLOOD (ROUTINE X 2)  CULTURE, BLOOD (ROUTINE X 2)  C DIFFICILE QUICK SCREEN W PCR REFLEX    GASTROINTESTINAL PANEL BY PCR, STOOL (REPLACES STOOL CULTURE)  LACTIC ACID, PLASMA  URINALYSIS, ROUTINE W REFLEX MICROSCOPIC  PROTIME-INR  PHOSPHORUS  MAGNESIUM  BASIC METABOLIC PANEL  CBC  D-DIMER, QUANTITATIVE     EKG  ED ECG REPORT I, Arta Silence, the attending physician, personally viewed and interpreted this ECG.  Date:  05/28/2022 EKG Time: 1656 Rate: 116 Rhythm: sinus tachycardia QRS Axis: normal Intervals: normal ST/T Wave abnormalities: normal Narrative Interpretation: no evidence of acute ischemia    RADIOLOGY  Chest x-ray: I independently viewed and interpreted the images; there is no focal consolidation or edema  CT abdomen/pelvis:  IMPRESSION:  There is interval appearance of marked wall thickening and pericolic  stranding throughout the colon, more so in transverse colon.  Findings suggest severe inflammatory or infectious colitis. There is  no loculated pericolic abscess.    There is no evidence of intestinal obstruction or pneumoperitoneum.  There is no hydronephrosis.    Bilateral renal stones.    Other findings as described in the body of the report.   CT lumbar spine:  IMPRESSION:  1. No acute or traumatic finding. Exaggerated lumbar lordosis.  2. Bilateral facet osteoarthritis at L4-5 and L5-S1 which could  contribute to low back pain.  3. Facet arthropathy on the left at S1-2 that could contribute to  regional pain.    PROCEDURES:  Critical Care performed: Yes, see critical care procedure note(s)  .Critical Care  Performed by: Arta Silence, MD Authorized by: Arta Silence, MD   Critical care provider statement:    Critical care time (minutes):  30   Critical care time was exclusive of:  Separately billable procedures and treating other patients   Critical care was necessary to treat or prevent imminent or life-threatening deterioration of the following conditions:  Sepsis   Critical care was time spent personally by me on the following activities:  Development of treatment plan with patient or surrogate, discussions with consultants, evaluation of patient's response to treatment, examination of patient, ordering and review of laboratory studies, ordering and review of radiographic studies, ordering and performing treatments and interventions, pulse  oximetry, re-evaluation of patient's condition, review of old charts and obtaining history from patient or surrogate   Care discussed with: admitting provider      MEDICATIONS ORDERED IN ED: Medications  ceFEPIme (MAXIPIME) 2 g in sodium chloride 0.9 % 100 mL IVPB (2 g Intravenous New Bag/Given 05/28/22 1719)  lactated ringers infusion (has no administration in time range)  acetaminophen (TYLENOL) tablet 650 mg (has no administration in time range)    Or  acetaminophen (TYLENOL) suppository 650 mg (has no administration in time range)  ondansetron (ZOFRAN) tablet 4 mg (has no administration in time range)  Or  ondansetron (ZOFRAN) injection 4 mg (has no administration in time range)  lactated ringers bolus 500 mL (has no administration in time range)  metroNIDAZOLE (FLAGYL) IVPB 500 mg (has no administration in time range)  sodium chloride 0.9 % bolus 1,000 mL (1,000 mLs Intravenous Bolus 05/28/22 1719)  ondansetron (ZOFRAN) injection 4 mg (4 mg Intravenous Given 05/28/22 1719)  fentaNYL (SUBLIMAZE) injection 50 mcg (50 mcg Intravenous Given 05/28/22 1718)     IMPRESSION / MDM / ASSESSMENT AND PLAN / ED COURSE  I reviewed the triage vital signs and the nursing notes.  57 year old female with PMH as noted above and status post recent prolonged hospitalization for sepsis, strep bacteremia, and lower GI bleed presents with persistent and worsening abdominal pain, nausea, diarrhea, as well ad generalized weakness and lightheadedness that have worsened in the last few days.  Exam reveals tachycardia with otherwise normal vital signs, and lower abdominal tenderness.  The patient also reports worsening chronic lower back pain and has had 1 fall since she left the hospital.  Differential diagnosis includes, but is not limited to, recurrent/worsening colitis, diverticulitis, UTI or other infection, pancreatitis, other hepatobiliary cause, acute sepsis, AKI, electrolyte abnormality or other metabolic  disturbance.  The patient has not had any recurrent GI bleeding since leaving the hospital.  We will obtain lab workup, CT abdomen/pelvis, give fluids and reassess.  I anticipate admission.  [name] presentation is most consistent with acute presentation with potential threat to life or bodily function.  The patient is on the cardiac monitor to evaluate for evidence of arrhythmia and/or significant heart rate changes.  ----------------------------------------- 5:42 PM on 05/28/2022 -----------------------------------------  CT shows findings of significant colitis.  Lab workup is significant for WBC of 30.2 but hemoglobin is not significantly changed from her recent hospitalization.  Creatinine is elevated similar to when she left the hospital.  She is somewhat hyponatremic.  Lactate is normal.  Overall presentation is consistent with worsening colitis.  I have ordered cefepime and Flagyl for coverage of intra-abdominal infection.    The patient is still slightly tachycardic although had difficult IV access and so was now receiving fluids.  Blood cultures have been sent.  I consulted Dr. Tobie Poet from the hospitalist service; based on discussion she agrees to admit the patient.   FINAL CLINICAL IMPRESSION(S) / ED DIAGNOSES   Final diagnoses:  Colitis  Sepsis, due to unspecified organism, unspecified whether acute organ dysfunction present [facility])     Rx / DC Orders   ED Discharge Orders     None        Note:  This document was prepared using Dragon voice recognition software and may include unintentional dictation errors.    Arta Silence, MD 05/28/22 1743

## 2022-05-28 NOTE — Assessment & Plan Note (Signed)
Citric potassium citrate 10 mill equivalent p.o. solution one-time dose ordered BMP in a.m.

## 2022-05-28 NOTE — Assessment & Plan Note (Signed)
-   As needed nicotine patch ordered ?

## 2022-05-28 NOTE — Assessment & Plan Note (Addendum)
Check anemia panel Initiate type and screen I did not initiate blood transfusion at this time as patient does not appear to be symptomatic  [name] hemoglobin on 05/14/2022 and 05/27/2022 were 8.4

## 2022-05-28 NOTE — ED Triage Notes (Signed)
First nurse note: Patient arrived by EMS from home c/o abdominal pain since December 2023. EMS reports slight hardening in center. Lower back pain. Reports baseline pale in color.  History diabetes  Septic in December and transferred to [facility]  EMS vitals: 112/68 b/p 125HR 99% RA 149CBG 98.1oral

## 2022-05-28 NOTE — ED Notes (Signed)
1 set of blood cultures, 1 blue top, and 1 red top send to the lab with save stickers on them.

## 2022-05-28 NOTE — Assessment & Plan Note (Signed)
-   PPI resumed °

## 2022-05-28 NOTE — H&P (Addendum)
History and Physical   Darlene Barber [id number] DOB: 26-Dec-1965 DOA: 05/28/2022  PCP: [name], MD  Patient coming from: Home via EMS  I have personally briefly reviewed patient's old medical records in [place].  Chief Concern: Abdominal pain  HPI: [name] is a 57 year old female with history of CKD stage IV, bipolar disorder, depression, history of schizophrenia, COPD, GERD, hypertension, stage I obesity, with recent prolonged hospitalization at [facility] in [place] for multiple diagnoses including septic shock, respiratory failure, upper GI bleed, requiring pressors and intubation, and then Streptococcus bacteremia, developing acute kidney injury requiring CRRT treatment.  Vitals in the ED showed temperature 98.9, respiration rate 20, heart rate of 123, blood pressure 106/66, SpO2 100% on room air.  Serum sodium is 128, potassium 2.4, chloride 99, bicarb 17, BUN of 29, serum creatinine of 2.50, nonfasting blood glucose 123, GFR of 22, WBC 30.2, hemoglobin 7.8, platelets of 773.  Lactic acid is 1.0.  ED treatment: Fentanyl 50 mcg, ondansetron 4 mg IV one-time dose, cefepime 2 g IV, metronidazole 500 mg IV, sodium chloride 1 L bolus. ----------------------- At bedside, she is able to tell me name, age, current year, and current location.  She endorsed abdomina pain and diarrhea 4 days. Too many times per day to count.   She denies chest pain and shortness of breath. She denies dysuria. She denies trauma in the abdomen. She endorses shortness of breath, worse with exertion.  She endorses nausea and vomiting with water vomitus. Her diarrhea has been brown with black specks since she has been taking iron tablets.  Social history: She denies tobacco, etoh. She endorses THC vape to help hear sleep. She lives with her eldest son, Darlene Barber. She formerly worked in [employer]  ROS: Constitutional: no weight change, no fever ENT/Mouth: no sore throat,  no rhinorrhea Eyes: no eye pain, no vision changes Cardiovascular: no chest pain, + dyspnea,  no edema, no palpitations Respiratory: no cough, no sputum, no wheezing Gastrointestinal: + nausea, + vomiting, + diarrhea, no constipation Genitourinary: no urinary incontinence, no dysuria, no hematuria Musculoskeletal: no arthralgias, no myalgias Skin: no skin lesions, no pruritus, Neuro: + weakness, no loss of consciousness, no syncope Psych: no anxiety, no depression, + decrease appetite Heme/Lymph: no bruising, no bleeding  ED Course: Discussed with emergency medicine provider, patient requiring hospitalization for chief concerns of colitis with markedly elevated leukocytosis.  Assessment/Plan  Principal Problem:   SIRS (systemic inflammatory response syndrome) (HCC) Active Problems:   Bipolar depression (HCC)   Gastroesophageal reflux disease without esophagitis   Insomnia   Essential hypertension   Tobacco abuse   Hypomagnesemia   Acute on chronic anemia   Hypokalemia   Sinus tachycardia   Diarrhea   Assessment and Plan:  * SIRS (systemic inflammatory response syndrome) (HCC) Etiology workup in progress at this time Blood cultures x 2 are in process Check GI panel and C. difficile screening Continue with broad-spectrum antibiotic, vancomycin, cefepime, metronidazole  Diarrhea Per patient patient has had multiple, too many to count diarrheal episodes And colitis on imaging Check C. difficile PCR and GI panel No antidiarrheal medication until C. difficile PCR and GI panel has resulted  Sinus tachycardia In setting of recent prolonged hospitalization, complicated course Check D-dimer, if positive will order a VQ scan D-dimer positive, VQ scan ordered Metoprolol 5 mg IV every 2 hours as needed for heart rate greater than 120, 3 doses ordered  Hypokalemia Citric potassium citrate 10 mill equivalent p.o.  solution one-time dose ordered BMP in a.m.  Acute on chronic  anemia Check anemia panel Initiate type and screen I did not initiate blood transfusion at this time as patient does not appear to be symptomatic  [name] hemoglobin on 05/14/2022 and 05/27/2022 were 8.4  Hypomagnesemia Magnesium sulfate 1 g IV one-time dose ordered Recheck magnesium level in the a.m.  Tobacco abuse As needed nicotine patch ordered  Essential hypertension Amlodipine 10 mg daily resumed  Insomnia Trazodone 50 mg nightly resumed  Gastroesophageal reflux disease without esophagitis PPI resumed  Bipolar depression (HCC) Nortriptyline 25 mg per home dosing resumed Quetiapine 300 mg nightly, trazodone 50 mg nightly, vilazodone 40 mg daily resumed  Chart reviewed.   Patient was hospitalized from 03/10/2022 to 04/23/2022: Patient was discharged to SNF.  Initial presenting diagnosis was severe sepsis with septic shock and respiratory failure requiring pressure support and intubation on initial presentation.  It was found that patient had Streptococcus bacteremia.  Hospital course was complicated by acute kidney injury requiring CRRT and acute metabolic encephalopathy/ICU delirium.  Upon stabilization, patient was transferred to [facility] service on 03/27/2022.  Subsequently, patient developed lower GI bleed and acute blood loss anemia requiring transfer back to ICU.  Ultimately she was found to have a large ulcer in her entire colon that required multiple units of PRBC transfusion.  She was then transferred back to [facility] service on 04/03/2022.  Significant events: 12/31>> admit to ICU-septic shock-intubated-required pressors stool studies/COVID/RSV/flu negative.  Blood cultures positive for strep 01/3 CRRT 01/5>> TEE done with no evidence of vegetations 01/11>>extubated, transferred from [facility] for iHD 01/17>> transfer to [facility] services 01/18>> developed lower GI bleeding-GI consulted-PRBC transfused 01/18>> transfer back to the ICU 01/20>> colonoscopy with multiple ulcers in  colon. 01/24>> transferred to [facility] 2/9>> cleared to ICU secondary to severe metabolic acidosis in the setting of diarrhea, this has resolved, she was transferred back to progressive on 2/11.   Significant studies: 12/30>> CT chest/abdomen/pelvis: Fluid throughout stomach/small bowel/colon-nonspecific 01/05>> CT chest/abdomen/pelvis: Persistent areas of wall thickening along the colon, patchy bilateral ill-defined lung opacities 01/19>> CT abdomen/pelvis: Diarrheal illness with fluid reaching the rectum, colonic wall thickening is improved.  Improving bilateral PNA.  Bilateral nephrolithiasis without obstruction. 01/19>> CT angio GI bleed: Limited evaluation because of oral contrast within the distal small bowel/throughout colon-active GI bleeding could not be assessed. 01/21>> CT angio GI bleed: No active GI bleeding, multiple infectious/inflammatory colitis involving splenic flexure and ascending colon. 02/9 >> CT abdomen pelvis without contras with no acute findings, with interval improvement of previously demonstrated diffuse colonic wall thickening and surrounding inflammatory changes .   Significant microbiology data: 12/30>> blood culture: Streptococcus Infantarius 12/30>> stool C. difficile negative 12/30>> GI pathogen panel: Negative 01/02>> tracheal aspirate: No organisms 01/03>> blood culture: No growth 01/03>> blood culture: No growth 01/17>> blood culture: No growth   Procedures: 12/31-1/11>> ETT 1/05>> TEE: EF 60-65%-no vegetations 1/20>> colonoscopy: Blood in entire colon, multiple ulcers throughout the colon.   Consults: PCCM GI General surgery Nephrology ID  DVT prophylaxis: ted hose Code Status: Full code Diet: Clear with with order to advance as tolerated to full liquids Family Communication: a phone call was offered, patient declined stating that Darlene Barber (33 m) and Marjory Lies (71 ym), both know she is in the hospital Disposition Plan: Pending clinical  course Consults called: none at this time Admission status: Telemetry medical, inpatient  Past Medical History:  Diagnosis Date   Allergy    Anxiety    Bursitis of both  hips    Depression    Frequent headaches    Obesity    Sleep apnea    doesn't use CPAP machine broken,    Past Surgical History:  Procedure Laterality Date   BIOPSY  03/30/2022   Procedure: BIOPSY;  Surgeon: Yetta Flock, MD;  Location: [facility];  Service: Gastroenterology;;   CESAREAN SECTION     x2   CHOLECYSTECTOMY     COLONOSCOPY WITH PROPOFOL N/A 11/20/2017   Procedure: COLONOSCOPY WITH PROPOFOL;  Surgeon: Lin Landsman, MD;  Location: [facility];  Service: Gastroenterology;  Laterality: N/A;   COLONOSCOPY WITH PROPOFOL N/A 03/30/2022   Procedure: COLONOSCOPY WITH PROPOFOL;  Surgeon: Yetta Flock, MD;  Location: [place];  Service: Gastroenterology;  Laterality: N/A;   ESOPHAGOGASTRODUODENOSCOPY (EGD) WITH PROPOFOL N/A 03/30/2022   Procedure: ESOPHAGOGASTRODUODENOSCOPY (EGD) WITH PROPOFOL;  Surgeon: Yetta Flock, MD;  Location: [place];  Service: Gastroenterology;  Laterality: N/A;   HYSTERECTOMY ABDOMINAL WITH SALPINGECTOMY  1998   HYSTEROSCOPY     Social History:  reports that she has been smoking cigarettes. She has a 30.00 pack-year smoking history. She uses smokeless tobacco. She reports current alcohol use. She reports current drug use. Drug: Marijuana.  Allergies  Allergen Reactions   Piper Other (See Comments)    Black pepper Feels like throat is closing, itchy Feels like throat is closing, itchy Feels like throat is closing, itchy   Tape Rash    Paper tape is ok to use.   Family History  Problem Relation Age of Onset   Hodgkin's lymphoma Mother    Heart failure Maternal Grandmother    Alzheimer's disease Maternal Grandfather    Emphysema Paternal Grandmother    Diabetes Maternal Aunt    Diabetes Maternal Uncle    Cancer Other    Breast cancer  Neg Hx    Family history: Family history reviewed and not pertinent.  Prior to Admission medications   Medication Sig Start Date End Date Taking? Authorizing Provider  albuterol (VENTOLIN HFA) 108 (90 Base) MCG/ACT inhaler INHALE 2 PUFFS EVERY FOUR HOURS AS NEEDED 10/08/21  Yes Karamalegos, Devonne Doughty, DO  amLODipine (NORVASC) 10 MG tablet Take 1 tablet (10 mg total) by mouth daily. 07/04/21  Yes Karamalegos, Devonne Doughty, DO  fexofenadine (ALLEGRA) 180 MG tablet Take 180 mg by mouth daily. 05/15/22 05/15/23 Yes [provider]  fluticasone (FLONASE) 50 MCG/ACT nasal spray 1 SPARY IN EACH NOSTRIL DAILY 10/08/21  Yes Karamalegos, Alexander J, DO  lidocaine (LIDODERM) 5 % Place 1 patch onto the skin daily. Remove & Discard patch within 12 hours or as directed by MD 04/14/22  Yes Thurnell Lose, MD  loperamide (IMODIUM) 2 MG capsule Take 1 capsule (2 mg total) by mouth as needed for diarrhea or loose stools. 04/23/22  Yes Elgergawy, Silver Huguenin, MD  nortriptyline (PAMELOR) 25 MG capsule Take 25 mg by mouth See admin instructions. Take 25 mg (1 capsule) by mouth every morning, and 50 mg (2 capsules) every night at bedtime. 10/24/20  Yes [provider]  ondansetron (ZOFRAN-ODT) 4 MG disintegrating tablet Take by mouth. Take 1 tablet (4 mg total) by mouth every 8 (eight) hours as needed for Nausea for up to 40 days 05/23/22 07/02/22 Yes [provider]  promethazine (PHENERGAN) 25 MG tablet Take 1 tablet (25 mg total) by mouth every 6 (six) hours as needed for nausea or vomiting. 09/03/21  Yes Karamalegos, Devonne Doughty, DO  QUEtiapine (SEROQUEL) 300 MG tablet Take  300 mg by mouth at bedtime. 05/24/22  Yes [provider]  sodium bicarbonate 650 MG tablet Take 1 tablet (650 mg total) by mouth 2 (two) times daily. 04/23/22  Yes Elgergawy, Silver Huguenin, MD  traZODone (DESYREL) 50 MG tablet Take 1 tablet by mouth at bedtime. 05/15/22 05/15/23 Yes [provider]  Vilazodone HCl (VIIBRYD)  40 MG TABS Take 40 mg by mouth daily. 10/24/21  Yes [provider]  EMGALITY 120 MG/ML SOSY Inject into the skin. Patient not taking: Reported on 05/28/2022 01/01/22   [provider]  methocarbamol (ROBAXIN) 500 MG tablet Take 500 mg by mouth daily. Patient not taking: Reported on 05/28/2022 12/12/21   [provider]  multivitamin (RENA-VIT) TABS tablet Take 1 tablet by mouth at bedtime. Patient not taking: Reported on 05/28/2022 04/13/22   Thurnell Lose, MD  Nutritional Supplements (,FEEDING SUPPLEMENT, PROSOURCE PLUS) liquid Take 30 mLs by mouth 2 (two) times daily between meals. 04/13/22   Thurnell Lose, MD  oxyCODONE (OXY IR/ROXICODONE) 5 MG immediate release tablet Take 1 tablet (5 mg total) by mouth every 6 (six) hours as needed for moderate pain or severe pain (5mg  for moderate pain, 10mg  for severe pain). Patient not taking: Reported on 05/28/2022 04/23/22   Elgergawy, Silver Huguenin, MD  pantoprazole (PROTONIX) 40 MG tablet Take 1 tablet (40 mg total) by mouth daily. Patient not taking: Reported on 05/28/2022 04/14/22   Thurnell Lose, MD  QUEtiapine (SEROQUEL) 400 MG tablet Take 1 tablet (400 mg total) by mouth at bedtime. Patient not taking: Reported on 05/28/2022 04/23/22   Elgergawy, Silver Huguenin, MD  SYMBICORT 160-4.5 MCG/ACT inhaler INHALE 2 PUFFS [place] EVENING Patient not taking: Reported on 05/28/2022 10/18/21   Olin Hauser, DO  tiotropium (SPIRIVA HANDIHALER) 18 MCG inhalation capsule INHALE CONTENTS OF ONE CAPSULE ONCE DAILY USING HANDIHALER DEVICE Patient not taking: Reported on 05/28/2022 10/08/21   Olin Hauser, DO   Physical Exam: Vitals:   05/28/22 1420 05/28/22 1512 05/28/22 1730 05/28/22 2307  BP:  112/72 105/69 99/63  Pulse:  (!) 119 (!) 115 100  Resp:  13 13 20   Temp:    98.9 F (37.2 C)  TempSrc:      SpO2:  92% 97% 100%  Weight: 61.2 kg     Height: 4\' 10"  (1.473 m)      Constitutional: appears older  than chronological age, NAD, calm, comfortable Eyes: PERRL, lids and conjunctivae normal ENMT: Mucous membranes are moist. Posterior pharynx clear of any exudate or lesions. Age-appropriate dentition. Hearing appropriate Neck: normal, supple, no masses, no thyromegaly Respiratory: clear to auscultation bilaterally, no wheezing, no crackles. Normal respiratory effort. No accessory muscle use.  Cardiovascular: Regular rate and rhythm, no murmurs / rubs / gallops. No extremity edema. 2+ pedal pulses. No carotid bruits.  Abdomen: Obese abdomen, no tenderness, no masses palpated, no hepatosplenomegaly. Bowel sounds positive.  Musculoskeletal: no clubbing / cyanosis. No joint deformity upper and lower extremities. Good ROM, no contractures, no atrophy. Normal muscle tone.  Skin: no rashes, lesions, ulcers. No induration Neurologic: Sensation intact. Strength 5/5 in all 4.  Psychiatric: Lacks judgment and insight. Alert and oriented x 3.  Depressed mood.  Patient hugging stuffed animal and tearful.  EKG: independently reviewed, showing sinus tachycardia with rate of 116, QTc 435  Chest x-ray on Admission: I personally reviewed and I agree with radiologist reading as below.  CT ABDOMEN PELVIS WO CONTRAST  Result Date: 05/28/2022 CLINICAL  DATA:  Abdominal pain EXAM: CT ABDOMEN AND PELVIS WITHOUT CONTRAST TECHNIQUE: Multidetector CT imaging of the abdomen and pelvis was performed following the standard protocol without IV contrast. RADIATION DOSE REDUCTION: This exam was performed according to the departmental dose-optimization program which includes automated exposure control, adjustment of the mA and/or kV according to patient size and/or use of iterative reconstruction technique. COMPARISON:  04/19/2022 FINDINGS: Lower chest: Small linear densities in the anterior lower lung fields may suggest scarring or subsegmental atelectasis. Hepatobiliary: No focal abnormalities are seen in liver. Surgical clips are  seen in gallbladder fossa. There is no dilation of bile ducts. Pancreas: No focal abnormalities are seen. Spleen: Unremarkable. Adrenals/Urinary Tract: Adrenals are unremarkable. There is no hydronephrosis. There are multiple small bilateral renal stones measuring up to 3 mm in size. There is linear high density in the cortex in the medial aspect of upper pole of left kidney. Significance of this finding is not clear. Ureters are not dilated. Urinary bladder is not distended. Stomach/Bowel: Stomach is not distended. Small bowel loops are not dilated. Appendix is not seen. There is abnormal diffuse wall thickening in colon, more so in transverse colon. There is pericolic stranding. There is no loculated pericolic fluid collection. Vascular/Lymphatic: Scattered arterial calcifications are seen. No new significant lymphadenopathy is seen. Reproductive: Uterus appears smaller than usual, possibly suggesting partial removal. No adnexal masses are seen. Other: There is no ascites or pneumoperitoneum. Umbilical hernia containing fat is seen. Musculoskeletal: No acute findings are seen. IMPRESSION: There is interval appearance of marked wall thickening and pericolic stranding throughout the colon, more so in transverse colon. Findings suggest severe inflammatory or infectious colitis. There is no loculated pericolic abscess. There is no evidence of intestinal obstruction or pneumoperitoneum. There is no hydronephrosis. Bilateral renal stones. Other findings as described in the body of the report. Electronically Signed   By: Elmer Picker M.D.   On: 05/28/2022 16:20   CT L-SPINE NO CHARGE  Result Date: 05/28/2022 CLINICAL DATA:  Low back pain EXAM: CT LUMBAR SPINE WITHOUT CONTRAST TECHNIQUE: Multidetector CT imaging of the lumbar spine was performed without intravenous contrast administration. Multiplanar CT image reconstructions were also generated. RADIATION DOSE REDUCTION: This exam was performed according to  the departmental dose-optimization program which includes automated exposure control, adjustment of the mA and/or kV according to patient size and/or use of iterative reconstruction technique. COMPARISON:  MRI 03/08/2020. FINDINGS: Segmentation: 5 lumbar type vertebral bodies. S1 has some transitional features. Alignment: Exaggerated lumbar lordosis.  No listhesis. Vertebrae: No fracture or focal bone lesion. Paraspinal and other soft tissues: See results of abdominal CT. Disc levels: No significant finding from T12-L1 through L3-4. L4-5: No disc abnormality. Bilateral facet osteoarthritis which could contribute to low back pain. L5-S1: No disc abnormality. Bilateral facet osteoarthritis which could contribute to regional back pain. S1-2: Transitional level. No disc abnormality or stenosis. Facet arthropathy on the left that could contribute to regional pain. IMPRESSION: 1. No acute or traumatic finding. Exaggerated lumbar lordosis. 2. 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 3. Facet arthropathy on the left at S1-2 that could contribute to regional pain. Electronically Signed   By: Nelson Chimes M.D.   On: 05/28/2022 16:15   DG Chest 2 View  Result Date: 05/28/2022 CLINICAL DATA:  Shortness of breath.  Rule out pneumonia EXAM: CHEST - 2 VIEW COMPARISON:  03/25/2022 FINDINGS: Remote left rib fractures. Midline trachea. Normal heart size and mediastinal contours. No pleural  effusion or pneumothorax. Clear lungs. IMPRESSION: No acute cardiopulmonary disease. Electronically Signed   By: Abigail Miyamoto M.D.   On: 05/28/2022 14:50    Labs on Admission: I have personally reviewed following labs  CBC: Recent Labs  Lab 05/28/22 1426  WBC 30.2*  NEUTROABS 26.1*  HGB 7.8*  HCT 23.8*  MCV 90.8  PLT 99991111*   Basic Metabolic Panel: Recent Labs  Lab 05/28/22 1426 05/28/22 1713  NA 128*  --   K 3.4*  --   CL 99  --   CO2 17*  --   GLUCOSE 123*  --   BUN 29*   --   CREATININE 2.50*  --   CALCIUM 9.3  --   MG  --  1.6*  PHOS  --  3.6   GFR: Estimated Creatinine Clearance: 19.4 mL/min (A) (by C-G formula based on SCr of 2.5 mg/dL (H)).  Liver Function Tests: Recent Labs  Lab 05/28/22 1426  AST 14*  ALT 12  ALKPHOS 147*  BILITOT 0.5  PROT 6.3*  ALBUMIN 2.3*   Urine analysis:    Component Value Date/Time   COLORURINE YELLOW 04/19/2022 2129   APPEARANCEUR CLEAR 04/19/2022 2129   APPEARANCEUR Clear 12/25/2021 1409   LABSPEC 1.013 04/19/2022 2129   LABSPEC 1.010 04/20/2012 1725   PHURINE 5.0 04/19/2022 2129   GLUCOSEU NEGATIVE 04/19/2022 2129   GLUCOSEU Negative 04/20/2012 1725   HGBUR NEGATIVE 04/19/2022 2129   BILIRUBINUR MODERATE (A) 04/19/2022 2129   BILIRUBINUR Negative 12/25/2021 1409   BILIRUBINUR Negative 04/20/2012 1725   KETONESUR 5 (A) 04/19/2022 2129   PROTEINUR 30 (A) 04/19/2022 2129   NITRITE NEGATIVE 04/19/2022 2129   LEUKOCYTESUR TRACE (A) 04/19/2022 2129   LEUKOCYTESUR Negative 04/20/2012 1725   This document was prepared using Dragon Voice Recognition software and may include unintentional dictation errors.  Dr. Tobie Poet [facility]  If 7PM-7AM, please contact overnight-coverage provider If 7AM-7PM, please contact day coverage provider [email or web address]  05/28/2022, 11:20 PM

## 2022-05-28 NOTE — Assessment & Plan Note (Signed)
-  Continue amlodipine 

## 2022-05-28 NOTE — Consult Note (Signed)
PHARMACY -  BRIEF ANTIBIOTIC NOTE   Pharmacy has received consult(s) for intra-abdominal from an ED provider.  The patient's profile has been reviewed for ht/wt/allergies/indication/available labs.    One time order(s) placed for cefepime  Further antibiotics/pharmacy consults should be ordered by admitting physician if indicated.                       Thank you, Oswald Hillock 05/28/2022  4:55 PM

## 2022-05-28 NOTE — Assessment & Plan Note (Addendum)
Etiology workup in progress at this time Blood cultures x 2 are in process Check GI panel and C. difficile screening Continue with broad-spectrum antibiotic, vancomycin, cefepime, metronidazole

## 2022-05-28 NOTE — ED Triage Notes (Signed)
Pt to ED via [facility] from home for lower abdominal pain. Pt states that she was admitted into the hospital in December, pt was in septic shock. Pt states that she has been having nausea and vomiting and persistent pain in her abdomen. Pt reports that the pain has not improved since being in the hospital. Pt appears ill. Pt is tachycardic in triage.

## 2022-05-28 NOTE — Assessment & Plan Note (Signed)
Magnesium sulfate 1 g IV one-time dose ordered Recheck magnesium level in the a.m.

## 2022-05-28 NOTE — Assessment & Plan Note (Signed)
Nortriptyline 25 mg per home dosing resumed Quetiapine 300 mg nightly, trazodone 50 mg nightly, vilazodone 40 mg daily resumed

## 2022-05-28 NOTE — ED Notes (Addendum)
PIV attempted by this RN, pt states "I just had a PICC line".

## 2022-05-28 NOTE — ED Notes (Signed)
Pt at CT

## 2022-05-28 NOTE — Assessment & Plan Note (Signed)
Per patient patient has had multiple, too many to count diarrheal episodes And colitis on imaging Check C. difficile PCR and GI panel No antidiarrheal medication until C. difficile PCR and GI panel has resulted

## 2022-05-28 NOTE — Consult Note (Signed)
Pharmacy Antibiotic Note  Darlene Barber is a 57 y.o. female admitted on 05/28/2022 with sepsis.  Pharmacy has been consulted for cefepime and vancomycin dosing. Scr has improved from last admission. No note of CRRT or HD continued at the time of discharge of last admission.   Plan: Will start cefepime 2 g q24H  Will give vancomycin 1500 mg x 1 and order a 24 hours vancomycin random level. Monitor Scr.   Height: 4\' 10"  (147.3 cm) Weight: 61.2 kg (135 lb) IBW/kg (Calculated) : 40.9  Temp (24hrs), Avg:98.9 F (37.2 C), Min:98.9 F (37.2 C), Max:98.9 F (37.2 C)  Recent Labs  Lab 05/28/22 1426  WBC 30.2*  CREATININE 2.50*  LATICACIDVEN 1.0    Estimated Creatinine Clearance: 19.4 mL/min (A) (by C-G formula based on SCr of 2.5 mg/dL (H)).    Allergies  Allergen Reactions   Piper Other (See Comments)    Black pepper Feels like throat is closing, itchy Feels like throat is closing, itchy Feels like throat is closing, itchy   Tape Rash    Paper tape is ok to use.    Antimicrobials this admission: 3/19 cefepime >>  3/19 vancomycin >>  3/19 flagyl >>   Dose adjustments this admission: None  Microbiology results: 3/19 BCx: pending  Thank you for allowing pharmacy to be a part of this patient's care.  Oswald Hillock, PharmD, BCPS 05/28/2022 5:43 PM

## 2022-05-28 NOTE — Assessment & Plan Note (Addendum)
In setting of recent prolonged hospitalization, complicated course Check D-dimer, if positive will order a VQ scan D-dimer positive, VQ scan ordered Metoprolol 5 mg IV every 2 hours as needed for heart rate greater than 120, 3 doses ordered

## 2022-05-28 NOTE — ED Notes (Signed)
Pt presents to ED with c/o of lower abd pain. Pt states this has been an ongoing issue since December 20023, pt states she was treated and had a long stay in the hospital due to "septic shock". Pt denies fevers or chills. Pt does endorse some diarrhea.   When this RN entered room pt has a immediate request for a pillow, pt was provided with one and also asks for a remote. Pt is NAD at this time.

## 2022-05-28 NOTE — Assessment & Plan Note (Signed)
-   Trazodone 50 mg nightly resumed 

## 2022-05-29 ENCOUNTER — Inpatient Hospital Stay: Payer: Medicaid Other

## 2022-05-29 DIAGNOSIS — I10 Essential (primary) hypertension: Secondary | ICD-10-CM

## 2022-05-29 DIAGNOSIS — E440 Moderate protein-calorie malnutrition: Secondary | ICD-10-CM | POA: Insufficient documentation

## 2022-05-29 DIAGNOSIS — A0472 Enterocolitis due to Clostridium difficile, not specified as recurrent: Principal | ICD-10-CM

## 2022-05-29 DIAGNOSIS — D649 Anemia, unspecified: Secondary | ICD-10-CM | POA: Diagnosis not present

## 2022-05-29 DIAGNOSIS — E876 Hypokalemia: Secondary | ICD-10-CM | POA: Diagnosis not present

## 2022-05-29 DIAGNOSIS — N184 Chronic kidney disease, stage 4 (severe): Secondary | ICD-10-CM

## 2022-05-29 DIAGNOSIS — D631 Anemia in chronic kidney disease: Secondary | ICD-10-CM

## 2022-05-29 DIAGNOSIS — K219 Gastro-esophageal reflux disease without esophagitis: Secondary | ICD-10-CM

## 2022-05-29 DIAGNOSIS — R6510 Systemic inflammatory response syndrome (SIRS) of non-infectious origin without acute organ dysfunction: Secondary | ICD-10-CM

## 2022-05-29 LAB — BASIC METABOLIC PANEL
Anion gap: 7 (ref 5–15)
BUN: 28 mg/dL — ABNORMAL HIGH (ref 6–20)
CO2: 16 mmol/L — ABNORMAL LOW (ref 22–32)
Calcium: 8.1 mg/dL — ABNORMAL LOW (ref 8.9–10.3)
Chloride: 107 mmol/L (ref 98–111)
Creatinine, Ser: 2.59 mg/dL — ABNORMAL HIGH (ref 0.44–1.00)
GFR, Estimated: 21 mL/min — ABNORMAL LOW (ref >60)
Glucose, Bld: 108 mg/dL — ABNORMAL HIGH (ref 70–99)
Potassium: 3.4 mmol/L — ABNORMAL LOW (ref 3.5–5.1)
Sodium: 130 mmol/L — ABNORMAL LOW (ref 135–145)

## 2022-05-29 LAB — GASTROINTESTINAL PANEL BY PCR, STOOL (REPLACES STOOL CULTURE)

## 2022-05-29 LAB — VITAMIN B12: Vitamin B-12: 326 pg/mL (ref 180–914)

## 2022-05-29 LAB — CBC
HCT: 18.1 % — ABNORMAL LOW (ref 36.0–46.0)
Hemoglobin: 6 g/dL — ABNORMAL LOW (ref 12.0–15.0)
MCH: 30.3 pg (ref 26.0–34.0)
MCHC: 33.1 g/dL (ref 30.0–36.0)
MCV: 91.4 fL (ref 80.0–100.0)
Platelets: 606 10*3/uL — ABNORMAL HIGH (ref 150–400)
RBC: 1.98 MIL/uL — ABNORMAL LOW (ref 3.87–5.11)
RDW: 17.6 % — ABNORMAL HIGH (ref 11.5–15.5)
WBC: 26 10*3/uL — ABNORMAL HIGH (ref 4.0–10.5)
nRBC: 0 % (ref 0.0–0.2)

## 2022-05-29 LAB — PROTIME-INR
INR: 1.4 — ABNORMAL HIGH (ref 0.8–1.2)
Prothrombin Time: 16.9 seconds — ABNORMAL HIGH (ref 11.4–15.2)

## 2022-05-29 LAB — C DIFFICILE QUICK SCREEN W PCR REFLEX
C Diff antigen: POSITIVE — AB
C Diff interpretation: DETECTED
C Diff toxin: POSITIVE — AB

## 2022-05-29 LAB — HEMOGLOBIN AND HEMATOCRIT, BLOOD
HCT: 24.9 % — ABNORMAL LOW (ref 36.0–46.0)
Hemoglobin: 8.6 g/dL — ABNORMAL LOW (ref 12.0–15.0)

## 2022-05-29 LAB — PREPARE RBC (CROSSMATCH)

## 2022-05-29 LAB — LACTIC ACID, PLASMA: Lactic Acid, Venous: 0.5 mmol/L (ref 0.5–1.9)

## 2022-05-29 LAB — MAGNESIUM: Magnesium: 1.9 mg/dL (ref 1.7–2.4)

## 2022-05-29 MED ORDER — LACTATED RINGERS IV BOLUS
500.0000 mL | Freq: Once | INTRAVENOUS | Status: AC
  Administered 2022-05-29: 500 mL via INTRAVENOUS

## 2022-05-29 MED ORDER — ACETAMINOPHEN 650 MG RE SUPP: 650.0000 mg | Freq: Four times a day (QID) | RECTAL | Status: DC | PRN

## 2022-05-29 MED ORDER — VANCOMYCIN HCL 125 MG PO CAPS
125.0000 mg | ORAL_CAPSULE | Freq: Four times a day (QID) | ORAL | Status: DC
  Administered 2022-05-29 – 2022-06-01 (×12): 125 mg via ORAL
  Filled 2022-05-29 (×14): qty 1

## 2022-05-29 MED ORDER — OXYCODONE HCL 5 MG PO TABS
5.0000 mg | ORAL_TABLET | Freq: Four times a day (QID) | ORAL | Status: DC | PRN
  Administered 2022-05-29 – 2022-06-01 (×5): 5 mg via ORAL
  Filled 2022-05-29 (×6): qty 1

## 2022-05-29 MED ORDER — ACETAMINOPHEN 325 MG PO TABS: 650.0000 mg | ORAL_TABLET | Freq: Four times a day (QID) | ORAL | Status: DC | PRN

## 2022-05-29 MED ORDER — POTASSIUM CHLORIDE CRYS ER 20 MEQ PO TBCR
40.0000 meq | EXTENDED_RELEASE_TABLET | Freq: Once | ORAL | Status: AC
  Administered 2022-05-29: 40 meq via ORAL
  Filled 2022-05-29: qty 2

## 2022-05-29 MED ORDER — MOMETASONE FURO-FORMOTEROL FUM 100-5 MCG/ACT IN AERO
2.0000 | INHALATION_SPRAY | Freq: Two times a day (BID) | RESPIRATORY_TRACT | Status: DC
  Administered 2022-05-30 – 2022-06-01 (×3): 2 via RESPIRATORY_TRACT
  Filled 2022-05-29: qty 8.8

## 2022-05-29 MED ORDER — VANCOMYCIN VARIABLE DOSE PER UNSTABLE RENAL FUNCTION (PHARMACIST DOSING): Status: DC

## 2022-05-29 MED ORDER — SODIUM CHLORIDE 0.9% IV SOLUTION: Freq: Once | INTRAVENOUS | Status: AC

## 2022-05-29 MED ORDER — TIOTROPIUM BROMIDE MONOHYDRATE 18 MCG IN CAPS
18.0000 ug | ORAL_CAPSULE | Freq: Every day | RESPIRATORY_TRACT | Status: DC
  Administered 2022-05-30 – 2022-06-01 (×3): 18 ug via RESPIRATORY_TRACT
  Filled 2022-05-29: qty 5

## 2022-05-29 MED ORDER — FENTANYL CITRATE PF 50 MCG/ML IJ SOSY
25.0000 ug | PREFILLED_SYRINGE | INTRAMUSCULAR | Status: DC | PRN
  Administered 2022-05-29 (×3): 25 ug via INTRAVENOUS
  Filled 2022-05-29 (×3): qty 1

## 2022-05-29 MED ORDER — TECHNETIUM TO 99M ALBUMIN AGGREGATED
4.3900 | Freq: Once | INTRAVENOUS | Status: AC | PRN
  Administered 2022-05-29: 4.39 via INTRAVENOUS

## 2022-05-29 MED ORDER — SODIUM CHLORIDE 0.9 % IV SOLN: INTRAVENOUS | Status: DC

## 2022-05-29 NOTE — Progress Notes (Signed)
[facility]                                                                               Darlene Barber, is a 57 y.o. female, DOB - 10/02/1965, [id number] Admit date - 05/28/2022    Outpatient Primary MD for the patient is Darlene Hire, MD  LOS - 1  days    Brief summary   [name] is a 57 year old female with history of CKD stage IV, bipolar disorder, depression, history of schizophrenia, COPD, GERD, hypertension, stage I obesity, with recent prolonged hospitalization at [facility] in [place] for multiple diagnoses including septic shock, respiratory failure, upper GI bleed, requiring pressors and intubation, and then Streptococcus bacteremia, developing acute kidney injury requiring CRRT treatment.  She was admitted for evaluation and management of colitis.    Assessment & Plan    Assessment and Plan: * SIRS (systemic inflammatory response syndrome) (HCC) Etiology workup in progress at this time Blood cultures x 2 are in process C diff positive and she was started on oral vancomycin.   C diff Colitis  Started her on oral vancomycin.  Improving leukocytosis.    Sinus tachycardia In setting of recent prolonged hospitalization, complicated course Check D-dimer, if positive will order a VQ scan, much improved.  D-dimer positive, VQ scan ordered Prn metoprolol.   Hypokalemia Replaced.     Acute anemia superimposed on Anemia of chronic disease secondary to stage 4 CKD.  1 unit of prbc transfusion ordered.  Anemia panel shows iron deficiency, supplementation will be added on discharge.   Hypomagnesemia Replaced. Recheck levels in am.  Tobacco abuse As needed nicotine patch ordered  Essential hypertension BP soft this am, holding norvasc .  Insomnia Trazodone 50 mg nightly resumed  Gastroesophageal reflux disease without esophagitis PPI resumed  Bipolar depression (HCC) Nortriptyline 25 mg per home dosing  resumed Quetiapine 300 mg nightly, trazodone 50 mg nightly, vilazodone 40 mg daily resumed  Hyponatremia:  Suspect from dehydration.  Improved:    Estimated body mass index is 28.22 kg/m as calculated from the following:   Height as of this encounter: 4\' 10"  (1.473 m).   Weight as of this encounter: 61.2 kg.  Code Status: full code.  DVT Prophylaxis:  Place TED hose Start: 05/28/22 1736   Level of Care: Level of care: Telemetry Medical Family Communication: None at bedside.   Disposition Plan:     Remains inpatient appropriate:  C diff colitis, pending clinical improvement.   Procedures:  None.   Consultants:   None.   Antimicrobials:   Anti-infectives (From admission, onward)    Start     Dose/Rate Route Frequency Ordered Stop   05/29/22 1800  ceFEPIme (MAXIPIME) 2 g in sodium chloride 0.9 % 100 mL IVPB  Status:  Discontinued        2 g 200 mL/hr over 30 Minutes Intravenous Every 24 hours 05/28/22 1751 05/29/22 1101   05/29/22 1400  vancomycin (VANCOCIN) capsule 125 mg        125 mg Oral 4 times daily 05/29/22 1101 06/08/22 1359   05/29/22 1051  vancomycin variable dose per unstable renal function (pharmacist dosing)  Does not apply See admin instructions 05/29/22 1051     05/28/22 1800  metroNIDAZOLE (FLAGYL) IVPB 500 mg  Status:  Discontinued        500 mg 100 mL/hr over 60 Minutes Intravenous Every 12 hours 05/28/22 1737 05/29/22 1101   05/28/22 1800  vancomycin (VANCOREADY) IVPB 1500 mg/300 mL        1,500 mg 150 mL/hr over 120 Minutes Intravenous  Once 05/28/22 1751 05/28/22 2247   05/28/22 1645  ceFEPIme (MAXIPIME) 2 g in sodium chloride 0.9 % 100 mL IVPB        2 g 200 mL/hr over 30 Minutes Intravenous  Once 05/28/22 1641 05/28/22 1755   05/28/22 1645  metroNIDAZOLE (FLAGYL) IVPB 500 mg  Status:  Discontinued        500 mg 100 mL/hr over 60 Minutes Intravenous  Once 05/28/22 1641 05/28/22 1737        Medications  Scheduled Meds:   nortriptyline  25 mg Oral Daily   And   nortriptyline  50 mg Oral QHS   QUEtiapine  300 mg Oral QHS   sodium bicarbonate  650 mg Oral BID   traZODone  50 mg Oral QHS   vancomycin  125 mg Oral QID   vancomycin variable dose per unstable renal function (pharmacist dosing)   Does not apply See admin instructions   Vilazodone HCl  40 mg Oral Daily   Continuous Infusions:  lactated ringers 150 mL/hr (05/29/22 0720)   pantoprazole (PROTONIX) IV 80 mg (05/29/22 0810)   PRN Meds:.acetaminophen **OR** acetaminophen, albuterol, fentaNYL (SUBLIMAZE) injection, fluticasone, metoprolol tartrate, nicotine, ondansetron **OR** ondansetron (ZOFRAN) IV    Subjective:   Deriona Negro was seen and examined today.  Anxious and worried. Abdominal pain.    Objective:   Vitals:   05/28/22 2307 05/29/22 0820 05/29/22 1046 05/29/22 1109  BP: 99/63 (!) 89/60 117/69 (!) 101/59  Pulse: 100 98 (!) 103 100  Resp: 20 17 17 18   Temp: 98.9 F (37.2 C) 98.3 F (36.8 C) 98.5 F (36.9 C) 98.5 F (36.9 C)  TempSrc:   Oral Oral  SpO2: 100% 100% 100% 100%  Weight:      Height:        Intake/Output Summary (Last 24 hours) at 05/29/2022 1230 Last data filed at 05/29/2022 0700 Gross per 24 hour  Intake 390 ml  Output --  Net 390 ml   Filed Weights   05/28/22 1420  Weight: 61.2 kg     Exam General exam: Appears calm and comfortable  Respiratory system: Clear to auscultation. Respiratory effort normal. Cardiovascular system: S1 & S2 heard, RRR. No JVD,  Gastrointestinal system: Abdomen is soft, mildly generalized tenderness.  Central nervous system: Alert and oriented. No focal neurological deficits. Extremities: Symmetric 5 x 5 power. Skin: No rashes,  Psychiatry: anxious and crying.     Data Reviewed:  I have personally reviewed following labs and imaging studies   CBC Lab Results  Component Value Date   WBC 26.0 (H) 05/29/2022   RBC 1.98 (L) 05/29/2022   HGB 6.0 (L) 05/29/2022   HCT  18.1 (L) 05/29/2022   MCV 91.4 05/29/2022   MCH 30.3 05/29/2022   PLT 606 (H) 05/29/2022   MCHC 33.1 05/29/2022   RDW 17.6 (H) 05/29/2022   LYMPHSABS 2.2 05/28/2022   MONOABS 1.4 (H) 05/28/2022   EOSABS 0.1 05/28/2022   BASOSABS 0.1 99991111     Last metabolic panel Lab Results  Component Value Date  NA 130 (L) 05/29/2022   K 3.4 (L) 05/29/2022   CL 107 05/29/2022   CO2 16 (L) 05/29/2022   BUN 28 (H) 05/29/2022   CREATININE 2.59 (H) 05/29/2022   GLUCOSE 108 (H) 05/29/2022   GFRNONAA 21 (L) 05/29/2022   GFRAA 93 09/30/2019   CALCIUM 8.1 (L) 05/29/2022   PHOS 3.6 05/28/2022   PROT 6.3 (L) 05/28/2022   ALBUMIN 2.3 (L) 05/28/2022   BILITOT 0.5 05/28/2022   ALKPHOS 147 (H) 05/28/2022   AST 14 (L) 05/28/2022   ALT 12 05/28/2022   ANIONGAP 7 05/29/2022    CBG (last 3)  No results for input(s): "GLUCAP" in the last 72 hours.    Coagulation Profile: Recent Labs  Lab 05/29/22 0414  INR 1.4*     Radiology Studies: CT ABDOMEN PELVIS WO CONTRAST  Result Date: 05/28/2022 CLINICAL DATA:  Abdominal pain EXAM: CT ABDOMEN AND PELVIS WITHOUT CONTRAST TECHNIQUE: Multidetector CT imaging of the abdomen and pelvis was performed following the standard protocol without IV contrast. RADIATION DOSE REDUCTION: This exam was performed according to the departmental dose-optimization program which includes automated exposure control, adjustment of the mA and/or kV according to patient size and/or use of iterative reconstruction technique. COMPARISON:  04/19/2022 FINDINGS: Lower chest: Small linear densities in the anterior lower lung fields may suggest scarring or subsegmental atelectasis. Hepatobiliary: No focal abnormalities are seen in liver. Surgical clips are seen in gallbladder fossa. There is no dilation of bile ducts. Pancreas: No focal abnormalities are seen. Spleen: Unremarkable. Adrenals/Urinary Tract: Adrenals are unremarkable. There is no hydronephrosis. There are multiple small  bilateral renal stones measuring up to 3 mm in size. There is linear high density in the cortex in the medial aspect of upper pole of left kidney. Significance of this finding is not clear. Ureters are not dilated. Urinary bladder is not distended. Stomach/Bowel: Stomach is not distended. Small bowel loops are not dilated. Appendix is not seen. There is abnormal diffuse wall thickening in colon, more so in transverse colon. There is pericolic stranding. There is no loculated pericolic fluid collection. Vascular/Lymphatic: Scattered arterial calcifications are seen. No new significant lymphadenopathy is seen. Reproductive: Uterus appears smaller than usual, possibly suggesting partial removal. No adnexal masses are seen. Other: There is no ascites or pneumoperitoneum. Umbilical hernia containing fat is seen. Musculoskeletal: No acute findings are seen. IMPRESSION: There is interval appearance of marked wall thickening and pericolic stranding throughout the colon, more so in transverse colon. Findings suggest severe inflammatory or infectious colitis. There is no loculated pericolic abscess. There is no evidence of intestinal obstruction or pneumoperitoneum. There is no hydronephrosis. Bilateral renal stones. Other findings as described in the body of the report. Electronically Signed   By: Elmer Picker M.D.   On: 05/28/2022 16:20   CT L-SPINE NO CHARGE  Result Date: 05/28/2022 CLINICAL DATA:  Low back pain EXAM: CT LUMBAR SPINE WITHOUT CONTRAST TECHNIQUE: Multidetector CT imaging of the lumbar spine was performed without intravenous contrast administration. Multiplanar CT image reconstructions were also generated. RADIATION DOSE REDUCTION: This exam was performed according to the departmental dose-optimization program which includes automated exposure control, adjustment of the mA and/or kV according to patient size and/or use of iterative reconstruction technique. COMPARISON:  MRI 03/08/2020. FINDINGS:  Segmentation: 5 lumbar type vertebral bodies. S1 has some transitional features. Alignment: Exaggerated lumbar lordosis.  No listhesis. Vertebrae: No fracture or focal bone lesion. Paraspinal and other soft tissues: See results of abdominal CT. Disc levels: No significant finding  from T12-L1 through L3-4. L4-5: No disc abnormality. Bilateral facet osteoarthritis which could contribute to low back pain. L5-S1: No disc abnormality. Bilateral facet osteoarthritis which could contribute to regional back pain. S1-2: Transitional level. No disc abnormality or stenosis. Facet arthropathy on the left that could contribute to regional pain. IMPRESSION: 1. No acute or traumatic finding. Exaggerated lumbar lordosis. 2. 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 3. Facet arthropathy on the left at S1-2 that could contribute to regional pain. Electronically Signed   By: Nelson Chimes M.D.   On: 05/28/2022 16:15   DG Chest 2 View  Result Date: 05/28/2022 CLINICAL DATA:  Shortness of breath.  Rule out pneumonia EXAM: CHEST - 2 VIEW COMPARISON:  03/25/2022 FINDINGS: Remote left rib fractures. Midline trachea. Normal heart size and mediastinal contours. No pleural effusion or pneumothorax. Clear lungs. IMPRESSION: No acute cardiopulmonary disease. Electronically Signed   By: Abigail Miyamoto M.D.   On: 05/28/2022 14:50       Hosie Poisson M.D. [facility] 05/29/2022, 12:30 PM  Available via Epic secure chat 7am-7pm After 7 pm, please refer to night coverage provider listed on amion.

## 2022-05-29 NOTE — Progress Notes (Signed)
Initial Nutrition Assessment  DOCUMENTATION CODES:   Non-severe (moderate) malnutrition in context of chronic illness  INTERVENTION:   -Boost Breeze po TID, each supplement provides 250 kcal and 9 grams of protein  -MVI with minerals daily -SLP evaluation  NUTRITION DIAGNOSIS:   Moderate Malnutrition related to chronic illness (COPD) as evidenced by mild fat depletion, moderate fat depletion, mild muscle depletion, moderate muscle depletion, percent weight loss.  GOAL:   Patient will meet greater than or equal to 90% of their needs  MONITOR:   PO intake, Supplement acceptance, Diet advancement  REASON FOR ASSESSMENT:   Malnutrition Screening Tool    ASSESSMENT:   Pt with history of CKD stage IV, bipolar disorder, depression, history of schizophrenia, COPD, GERD, hypertension, stage I obesity, with recent prolonged hospitalization at [facility] in [place] for multiple diagnoses including septic shock, respiratory failure, upper GI bleed, requiring pressors and intubation, and then Streptococcus bacteremia, developing acute kidney injury requiring CRRT treatment.  Pt admitted with SIRS and C-diff colitis.   Reviewed I/O's: +390 ml x 24 hours   Spoke with pt at bedside, who was anxious and tearful at time of visit. Pt reports that she has experienced a general decline in health over the past 1-2 months. Pt explains that she had a prolonged hospitalization and SNF stay. She reports she has been home for about 3 weeks and is frustrated by her lack of progress. Pt shares she has a very poor appetite secondary to abdominal pain and trouble swallowing. Pt shares she is consuming mostly liquids and is fearful to try milk and solid foods due to past experiences. She shares she was on a respirator at the hospital and was told swallowing difficulties were normal, but should resolved (pt pt they never did). Per pt, she has had swallowing issues over the past month; she reports throat  tightness when trying to swallow some pills and liquids and gets the sensation that her throat "closes up". Pt also had aversion to drinking milk products due to fear of throwing up. She has been drinking water and chicken broth without difficulty.   Per pt, her UBW is around 155#. She estimates she has lost about 18-20# over the past 1-2 months due to recent health issues and poor oral intake.  Reviewed wt hx; pt has experienced a 13.1% wt loss over the past month.   Discussed importance of good meal and supplement intake to promote healing. Pt amenable to try Boost Breeze. She is hesitant to try diet advancement at this time.  Case discussed with RN and MD. RN plans to try and crush medications with applesauce. MD amenable to SLP evaluation.   Medications reviewed and include 0.9% sodium chloride infusion @ 100 ml/hr.   Lab Results  Component Value Date   HGBA1C 5.6 03/10/2022   PTA DM medications are none.   Labs reviewed: Na: 130, K: 3.4, CBGS: 100 (inpatient orders for glycemic control are none).    NUTRITION - FOCUSED PHYSICAL EXAM:  Flowsheet Row Most Recent Value  Orbital Region Mild depletion  Upper Arm Region Mild depletion  Thoracic and Lumbar Region No depletion  Buccal Region No depletion  Temple Region Moderate depletion  Clavicle Bone Region Mild depletion  Clavicle and Acromion Bone Region No depletion  Scapular Bone Region No depletion  Dorsal Hand Mild depletion  Patellar Region Mild depletion  Anterior Thigh Region Mild depletion  Posterior Calf Region Mild depletion  Edema (RD Assessment) None  Hair Reviewed  Eyes  Reviewed  Mouth Reviewed  Skin Reviewed  Nails Reviewed       Diet Order:   Diet Order             Diet clear liquid Room service appropriate? Yes; Fluid consistency: Thin  Diet effective now                   EDUCATION NEEDS:   Education needs have been addressed  Skin:  Skin Assessment: Reviewed RN Assessment  Last BM:   05/29/22 (type 7)  Height:   Ht Readings from Last 1 Encounters:  05/28/22 4\' 10"  (1.473 m)    Weight:   Wt Readings from Last 1 Encounters:  05/28/22 61.2 kg    Ideal Body Weight:  43.9 kg  BMI:  Body mass index is 28.22 kg/m.  Estimated Nutritional Needs:   Kcal:  1800-2000  Protein:  90-105 grams  Fluid:  > 1.8 L    Loistine Chance, RD, LDN, [place] Registered Dietitian II Certified Diabetes Care and Education Specialist Please refer to [facility] for RD and/or RD on-call/weekend/after hours pager

## 2022-05-29 NOTE — Progress Notes (Addendum)
Stool sample sent to lab at [place]. Called Daisy from lab and confirmed that she received it at [phone].

## 2022-05-29 NOTE — Progress Notes (Signed)
Tech informed me that patient stated  that she needed pain meds. Patient complained of abdominal pain but refused the tylenol that was ordered for her prn. She stated that tylenol does not work. Notified Dr. Karleen Hampshire via secure chat regarding pain med.

## 2022-05-29 NOTE — Evaluation (Signed)
Clinical/Bedside Swallow Evaluation Patient Details  Name: Darlene Barber MRN: [id number] Date of Birth: [birth year]  Today's Date: 05/29/2022 Time: SLP Start Time (ACUTE ONLY): 92 SLP Stop Time (ACUTE ONLY): 1630 SLP Time Calculation (min) (ACUTE ONLY): 60 min  Past Medical History:  Past Medical History:  Diagnosis Date   Allergy    Anxiety    Bursitis of both hips    Depression    Frequent headaches    Obesity    Sleep apnea    doesn't use CPAP machine broken,    Past Surgical History:  Past Surgical History:  Procedure Laterality Date   BIOPSY  03/30/2022   Procedure: BIOPSY;  Surgeon: Yetta Flock, MD;  Location: [facility];  Service: Gastroenterology;;   CESAREAN SECTION     x2   CHOLECYSTECTOMY     COLONOSCOPY WITH PROPOFOL N/A 11/20/2017   Procedure: COLONOSCOPY WITH PROPOFOL;  Surgeon: Lin Landsman, MD;  Location: [facility];  Service: Gastroenterology;  Laterality: N/A;   COLONOSCOPY WITH PROPOFOL N/A 03/30/2022   Procedure: COLONOSCOPY WITH PROPOFOL;  Surgeon: Yetta Flock, MD;  Location: [place];  Service: Gastroenterology;  Laterality: N/A;   ESOPHAGOGASTRODUODENOSCOPY (EGD) WITH PROPOFOL N/A 03/30/2022   Procedure: ESOPHAGOGASTRODUODENOSCOPY (EGD) WITH PROPOFOL;  Surgeon: Yetta Flock, MD;  Location: [place];  Service: Gastroenterology;  Laterality: N/A;   HYSTERECTOMY ABDOMINAL WITH SALPINGECTOMY  1998   HYSTEROSCOPY     HPI:  Pt is a 57 year old female with history of CKD stage IV, bipolar disorder, depression, history of schizophrenia, COPD, GERD, hypertension, stage I obesity, with recent prolonged hospitalization at [facility] in [place] for multiple diagnoses including septic shock, respiratory failure, upper GI bleed, requiring pressors and intubation, and then Streptococcus bacteremia, developing acute kidney injury requiring CRRT treatment.     She was admitted for evaluation and management of colitis.  Pt   is C diff positive and she was started on oral vancomycin.  CXR at admit: No acute cardiopulmonary disease.    Assessment / Plan / Recommendation  Clinical Impression   Pt seen for BSE today. Pt awake, sitting criss-cross in bed talking to Son on phone. Pt engaged adequately during this assessment but was intermittently labile discussing issues re: her "stomach hurting a lot" - NSG aware. Noted baseline Psychiatric dxs.  On RA; afebrile.     OF NOTE: Per Speech Therapy in 03/2022: "She was agreeable to taking sips of ginger ale, exhibiting timely swallow initiation and no overt s/s of dysphagia. Patient asking about crackers but also that she does not want to advance fully to solids. SLP recommending further PO advancement based on MD recommendations as she is not currently presenting with clinical s/s of dysphagia. SLP to S/O at this time.".  Per MBSS in 2020, "In the cervical esophagus there is a finger-like protrusion along the posterior wall during swallow (does not impede flow of boluses) consistent with prominent cricopharyngeus.  An esophageal sweep in the upright position with liquid and solid consistencies showed mid- and distal-esophageal retention with retrograde flow below the level of the pharyngoesophageal segment. A barium table moved rapidly through the pharynx and cervical esophagus.".  Per the above, pt seems to present as at her Baseline(especially similar to presentation w/ ST services at [facility] in 03/2022.)   Pt appears to present w/ functional oropharyngeal phase swallowing function w/ No overt oropharyngeal phase dysphagia appreciated during oral intake of trials; No sensorimotor swallowing deficits appreciated. Pt appears at reduced risk for aspiration from  an oropharyngeal phase standpoint following general aspiration precautions.  HOWEVER, pt has a baseline presentation of Esophageal phase Dysmotility, REFLUX. ANY Esophageal phase Dysmotility or Regurgitation of Reflux material can  increase risk for aspiration of the Reflux material during Retrograde flow thus impact Pulmonary status. Pt described issues of globus, dry throat. Pt also endorsed her stomach "hurt" when she ate "anything"(referring to foods). Pt is also EDENTULOUS after she stated her Dentures "got lost at the other hospital" -- this will impact effective mastication of solids.    Pt sat upright in bed and consumed trials of thin liquids Via Cup/Straw, purees w/ No overt clinical s/s of aspiration noted; clear vocal quality b/t trials, no decline in pulmonary status, no multiple swallows noted post initial pharyngeal swallow, no cough, and no decline in O2 sats(97%). Oral phase appeared [facility] for bolus management and timely A-P transfer/clearing of material.  OM exam was [facility] for oral clearing; lingual/labial movements. No unilateral weakness. Speech clear. Edentulous.   Recommend continue current clear liquid diet per MD d/t Colitis w/ upgrade of diet consistency to solids as pt tolerates -- recommend small cut/moistened foods for ease of mashing/gumming. Thin liquids. General aspiration precautions. Rest Breaks during meals/oral intake to allow for Esophageal clearing. REFLUX precautions strongly recommended to lessen chance for Regurgitation -- HOB elevated at night when sleeping. Pills Whole in Puree.    Recommend pt f/u w/ GI for assessment/management of Reflux, and education re: Esophageal phase dysmotility. Recommend f/u w/ Dentition for Dentures as desired. Ongoing support from Dietician for nutrition/diet options. Discussion on above w/ pt, NSG/MD. MD to reconsult ST services if any new needs while admitted.  SLP Visit Diagnosis: Dysphagia, unspecified (R13.10) (Esophageal phase dysmotility; Edentulous)    Aspiration Risk   (reduced risk for prandial aspiration - increased risk for REFLUX aspiration)    Diet Recommendation   continue current clear liquid diet per MD d/t Colitis w/ upgrade of diet consistency to  solids as pt tolerates -- recommend small cut/moistened foods for ease of mashing/gumming. Thin liquids. General aspiration precautions. Rest Breaks during meals/oral intake to allow for Esophageal clearing. REFLUX precautions strongly recommended to lessen chance for Regurgitation -- HOB elevated at night when sleeping.  Medication Administration: Whole meds with puree    Other  Recommendations Recommended Consults: Consider GI evaluation;Consider esophageal assessment (baseline issues - 2020) Oral Care Recommendations: Oral care BID;Oral care before and after PO;Patient independent with oral care (setup)    Recommendations for follow up therapy are one component of a multi-disciplinary discharge planning process, led by the attending physician.  Recommendations may be updated based on patient status, additional functional criteria and insurance authorization.  Follow up Recommendations No SLP follow up      Assistance Recommended at Discharge  PRN  Functional Status Assessment Patient has not had a recent decline in their functional status  Frequency and Duration  (n/a)   (n/a)       Prognosis Prognosis for improved oropharyngeal function: Fair (-Good) Barriers to Reach Goals: Time post onset;Severity of deficits;Motivation Barriers/Prognosis Comment: Esophageal phase dsymotility; motivation      Swallow Study   General Date of Onset: 05/28/22 HPI: Pt is a 57 year old female with history of CKD stage IV, bipolar disorder, depression, history of schizophrenia, COPD, GERD, hypertension, stage I obesity, with recent prolonged hospitalization at [facility] in [place] for multiple diagnoses including septic shock, respiratory failure, upper GI bleed, requiring pressors and intubation, and then Streptococcus bacteremia, developing acute kidney injury requiring  CRRT treatment.     She was admitted for evaluation and management of colitis.  Pt  is C diff positive and she was started on oral  vancomycin.  CXR at admit: No acute cardiopulmonary disease. Type of Study: Bedside Swallow Evaluation Previous Swallow Assessment: 03/2022- last dysphagia tx session: "She was agreeable to taking sips of ginger ale, exhibiting timely swallow initiation and no overt s/s of dysphagia. Patient asking about crackers but also that she does not want to advance fully to solids. SLP recommending further PO advancement based on MD recommendations as she is not currently presenting with clinical s/s of dysphagia. SLP to s/o at this time.".  MBSS in 2020 w/ Esophageal phase Dysmotility but NO oropharyngeal phase deficits. Diet Prior to this Study: Clear liquid diet (per MD d/t colitis dx) Temperature Spikes Noted: No Respiratory Status: Room air History of Recent Intubation: No Behavior/Cognition: Alert;Cooperative;Pleasant mood;Distractible (Labile) Oral Cavity Assessment: Within Functional Limits Oral Care Completed by SLP: Recent completion by staff Oral Cavity - Dentition: Edentulous ("lost Dentures at the other hospital") Vision: Functional for self-feeding Self-Feeding Abilities: Able to feed self;Needs set up Patient Positioning: Upright in bed Baseline Vocal Quality: Normal Volitional Cough: Strong Volitional Swallow: Able to elicit    Oral/Motor/Sensory Function Overall Oral Motor/Sensory Function: Within functional limits   Ice Chips Ice chips: Not tested   Thin Liquid Thin Liquid: Within functional limits Presentation: Cup;Self Fed;Straw (4 trials via each method; preferred straw use)    Nectar Thick Nectar Thick Liquid: Not tested   Honey Thick Honey Thick Liquid: Not tested   Puree Puree: Within functional limits Presentation: Spoon (fed; 3 trials) Other Comments: pt did not like the taste   Solid     Solid: Not tested Other Comments: d/t diet restrictions        Orinda Kenner, MS, CCC-SLP Speech Language Pathologist Rehab Services; [place] [phone]  (ascom) Vikkie Goeden 05/29/2022,5:38 PM

## 2022-05-29 NOTE — Progress Notes (Signed)
Patient is alert and oriented x3. Sometimes child-like in behavior. Refused lactic acid labs due to frequency of blood draws. Did not request anything for pain last night. Ambulated several times with assistance to bedside commode. Tech obtained stool sample and sent to lab, however, lab stated that they did not receive. MD is aware. Will obtain another stool sample. Additional needs denied.

## 2022-05-30 ENCOUNTER — Inpatient Hospital Stay: Payer: Medicaid Other

## 2022-05-30 ENCOUNTER — Other Ambulatory Visit (HOSPITAL_COMMUNITY): Payer: Self-pay

## 2022-05-30 DIAGNOSIS — R6510 Systemic inflammatory response syndrome (SIRS) of non-infectious origin without acute organ dysfunction: Secondary | ICD-10-CM | POA: Diagnosis not present

## 2022-05-30 DIAGNOSIS — E876 Hypokalemia: Secondary | ICD-10-CM | POA: Diagnosis not present

## 2022-05-30 DIAGNOSIS — D649 Anemia, unspecified: Secondary | ICD-10-CM | POA: Diagnosis not present

## 2022-05-30 LAB — BPAM RBC
Blood Product Expiration Date: 202403292359
ISSUE DATE / TIME: 202403201048
Unit Type and Rh: 600

## 2022-05-30 LAB — TYPE AND SCREEN
ABO/RH(D): A POS
Antibody Screen: NEGATIVE
Unit division: 0

## 2022-05-30 LAB — BASIC METABOLIC PANEL
Anion gap: 6 (ref 5–15)
BUN: 24 mg/dL — ABNORMAL HIGH (ref 6–20)
CO2: 17 mmol/L — ABNORMAL LOW (ref 22–32)
Calcium: 8.1 mg/dL — ABNORMAL LOW (ref 8.9–10.3)
Chloride: 110 mmol/L (ref 98–111)
Creatinine, Ser: 2.58 mg/dL — ABNORMAL HIGH (ref 0.44–1.00)
GFR, Estimated: 21 mL/min — ABNORMAL LOW (ref >60)
Glucose, Bld: 101 mg/dL — ABNORMAL HIGH (ref 70–99)
Potassium: 3.8 mmol/L (ref 3.5–5.1)
Sodium: 133 mmol/L — ABNORMAL LOW (ref 135–145)

## 2022-05-30 LAB — CBC WITH DIFFERENTIAL/PLATELET
Abs Immature Granulocytes: 0.27 10*3/uL — ABNORMAL HIGH (ref 0.00–0.07)
Basophils Absolute: 0.1 10*3/uL (ref 0.0–0.1)
Basophils Relative: 0 %
Eosinophils Absolute: 0.3 10*3/uL (ref 0.0–0.5)
Eosinophils Relative: 1 %
HCT: 25.1 % — ABNORMAL LOW (ref 36.0–46.0)
Hemoglobin: 8.3 g/dL — ABNORMAL LOW (ref 12.0–15.0)
Immature Granulocytes: 1 %
Lymphocytes Relative: 8 %
Lymphs Abs: 2.5 10*3/uL (ref 0.7–4.0)
MCH: 29.9 pg (ref 26.0–34.0)
MCHC: 33.1 g/dL (ref 30.0–36.0)
MCV: 90.3 fL (ref 80.0–100.0)
Monocytes Absolute: 1.1 10*3/uL — ABNORMAL HIGH (ref 0.1–1.0)
Monocytes Relative: 3 %
Neutro Abs: 28.4 10*3/uL — ABNORMAL HIGH (ref 1.7–7.7)
Neutrophils Relative %: 87 %
Platelets: 661 10*3/uL — ABNORMAL HIGH (ref 150–400)
RBC: 2.78 MIL/uL — ABNORMAL LOW (ref 3.87–5.11)
RDW: 18.1 % — ABNORMAL HIGH (ref 11.5–15.5)
WBC: 32.6 10*3/uL — ABNORMAL HIGH (ref 4.0–10.5)
nRBC: 0 % (ref 0.0–0.2)

## 2022-05-30 LAB — MAGNESIUM: Magnesium: 1.7 mg/dL (ref 1.7–2.4)

## 2022-05-30 MED ORDER — SIMETHICONE 40 MG/0.6ML PO SUSP
80.0000 mg | Freq: Four times a day (QID) | ORAL | Status: DC | PRN
  Administered 2022-05-31: 80 mg via ORAL
  Filled 2022-05-30 (×4): qty 1.2

## 2022-05-30 MED ORDER — FAMOTIDINE 20 MG PO TABS
20.0000 mg | ORAL_TABLET | Freq: Two times a day (BID) | ORAL | Status: DC
  Administered 2022-05-30 – 2022-05-31 (×3): 20 mg via ORAL
  Filled 2022-05-30 (×3): qty 1

## 2022-05-30 MED ORDER — ONDANSETRON HCL 4 MG PO TABS
4.0000 mg | ORAL_TABLET | Freq: Three times a day (TID) | ORAL | Status: AC
  Administered 2022-05-30 – 2022-06-01 (×6): 4 mg via ORAL
  Filled 2022-05-30 (×6): qty 1

## 2022-05-30 MED ORDER — MAGNESIUM SULFATE 2 GM/50ML IV SOLN
2.0000 g | Freq: Once | INTRAVENOUS | Status: AC
  Administered 2022-05-30: 2 g via INTRAVENOUS
  Filled 2022-05-30: qty 50

## 2022-05-30 MED ORDER — PHENOL 1.4 % MT LIQD
1.0000 | OROMUCOSAL | Status: DC | PRN
  Administered 2022-05-30: 1 via OROMUCOSAL
  Filled 2022-05-30: qty 177

## 2022-05-30 MED ORDER — PANTOPRAZOLE SODIUM 40 MG PO TBEC: 80.0000 mg | DELAYED_RELEASE_TABLET | Freq: Two times a day (BID) | ORAL | Status: DC

## 2022-05-30 NOTE — Progress Notes (Signed)
PHARMACIST - PHYSICIAN COMMUNICATION  DR:  Karleen Hampshire  CONCERNING: IV to Oral Route Change Policy  RECOMMENDATION: This patient is receiving pantoprazole by the intravenous route.  Based on criteria approved by the Pharmacy and Therapeutics Committee, the intravenous medication is being converted to the equivalent oral dose form.   DESCRIPTION: These criteria include: The patient is eating (either orally or via tube) and/or has been taking other orally administered medications for a least 24 hours The patient has no evidence of active gastrointestinal bleeding or impaired GI absorption (gastrectomy, short bowel, patient on TNA or NPO).  If you have questions about this conversion, please contact the Pharmacy Department  []   [phone] )  [facility] [x]   [phone] )  [facility] []   [phone] )  [facility] []   [phone] )  [facility] []   [phone] )  [place], PharmD, BCPS Clinical Pharmacist  05/30/2022 10:54 AM

## 2022-05-30 NOTE — Evaluation (Signed)
Physical Therapy Evaluation Patient Details Name: Darlene Barber MRN: [id number] DOB: [birth year] Today's Date: 05/30/2022  History of Present Illness  presented to ER secondary to abdominal pain; admitted for management of SIRS.  Of note, patient with recent hospitalization at [facility] secondary to septic shock, respiratory failure, GIB and bacteremia (discharged to STR, had completed/returned home)  Clinical Impression  Patient resting in bed upon arrival to session; alert and oriented, follows commands and agreeable to participation with session.  Endorses abdominal pain up to 7/10 with movement; meds requested per RN during session.  Bilat UE/LE strength and ROM grossly WFL and appropriate for basic transfers and gait; mild weakness to L LE with isolated testing (baseline per patient).  Able to complete bed mobility with mod indep; sit/stand, basic transfers and gait (30') with RW, cga/close sup.  Demonstrates reciprocal stepping pattern with mild foward trunk flexion; increased time/effort for turn negotiation, but no overt buckling or LOB.  Distance limited by fatigue and abdominal pain; anticipate consistent progression in distance and overall activity tolerance as pain controlled. Would benefit from skilled PT to address above deficits and promote optimal return to PLOF.; Recommend transition to HHPT upon discharge from acute hospitalization.        Recommendations for follow up therapy are one component of a multi-disciplinary discharge planning process, led by the attending physician.  Recommendations may be updated based on patient status, additional functional criteria and insurance authorization.  Follow Up Recommendations Home health PT      Assistance Recommended at Discharge PRN  Patient can return home with the following  A little help with walking and/or transfers;A little help with bathing/dressing/bathroom    Equipment Recommendations None recommended by PT (has RW, [id number])   Recommendations for Other Services       Functional Status Assessment Patient has had a recent decline in their functional status and demonstrates the ability to make significant improvements in function in a reasonable and predictable amount of time.     Precautions / Restrictions Precautions Precautions: None Restrictions Weight Bearing Restrictions: No      Mobility  Bed Mobility Overal bed mobility: Needs Assistance Bed Mobility: Supine to Sit     Supine to sit: Modified independent (Device/Increase time)          Transfers Overall transfer level: Needs assistance Equipment used: Rolling walker (2 wheels) Transfers: Sit to/from Stand, Bed to chair/wheelchair/BSC Sit to Stand: Min guard, Supervision Stand pivot transfers: Min guard, Supervision              Ambulation/Gait Ambulation/Gait assistance: Min guard Gait Distance (Feet): 30 Feet Assistive device: Rolling walker (2 wheels)         General Gait Details: reciprocal stepping pattern with mild foward trunk flexion; increased time/effort for turn negotiation, but no overt buckling or LOB  Stairs            Wheelchair Mobility    Modified Rankin (Stroke Patients Only)       Balance Overall balance assessment: Needs assistance Sitting-balance support: No upper extremity supported, Feet supported Sitting balance-Leahy Scale: Good     Standing balance support: Bilateral upper extremity supported Standing balance-Leahy Scale: Fair                               Pertinent Vitals/Pain Pain Assessment Pain Assessment: 0-10 Pain Score: 7  Pain Location: abdomen Pain Descriptors / Indicators: Aching, Guarding, Grimacing Pain Intervention(s): Limited activity  within patient's tolerance, Monitored during session, Repositioned    Home Living Family/patient expects to be discharged to:: Private residence Living Arrangements: Alone   Type of Home: Apartment Home Access:  Stairs to enter Entrance Stairs-Rails: Right;Left;Can reach both Entrance Stairs-Number of Steps: 2 flights   Home Layout: One [place]: Grab bars - tub/shower;Cane - quad      Prior Function Prior Level of Function : Independent/Modified Independent;History of Falls (last six months)             Mobility Comments: Ambulatory with RW for household distances; does endorse multiple fall history (due to L knee giving way)       Hand Dominance   Dominant Hand: Right    Extremity/Trunk Assessment   Upper Extremity Assessment Upper Extremity Assessment: Overall WFL for tasks assessed    Lower Extremity Assessment Lower Extremity Assessment: Generalized weakness (grossly 4/5 R LE, 4-/5 L LE; denies paresthesia)       Communication   Communication: No difficulties  Cognition Arousal/Alertness: Awake/alert Behavior During Therapy: WFL for tasks assessed/performed Overall Cognitive Status: Within Functional Limits for tasks assessed                                 General Comments: generally hyperverbal, min cuing to maintain topic of conversation        General Comments      Exercises Other Exercises Other Exercises: Toilet transfer, SPT without assist device, cga/close sup; sit/stand from [facility] for hygiene, clothing management, cga/close sup.  Does require UE support for external stabilization.   Assessment/Plan    PT Assessment Patient needs continued PT services  PT Problem List Decreased strength;Decreased range of motion;Decreased activity tolerance;Decreased balance;Decreased mobility;Decreased coordination;Decreased knowledge of use of DME;Decreased safety awareness;Decreased knowledge of precautions       PT Treatment Interventions DME instruction;Gait training;Stair training;Functional mobility training;Therapeutic activities;Therapeutic exercise;Balance training;Patient/family education    PT Goals (Current goals can be found in  the Care Plan section)  Acute Rehab PT Goals Patient Stated Goal: to return home PT Goal Formulation: With patient Time For Goal Achievement: 06/13/22 Potential to Achieve Goals: Good    Frequency Min 2X/week     Co-evaluation               AM-PAC PT "6 Clicks" Mobility  Outcome Measure Help needed turning from your back to your side while in a flat bed without using bedrails?: None Help needed moving from lying on your back to sitting on the side of a flat bed without using bedrails?: None Help needed moving to and from a bed to a chair (including a wheelchair)?: A Little Help needed standing up from a chair using your arms (e.g., wheelchair or bedside chair)?: A Little Help needed to walk in hospital room?: A Little Help needed climbing 3-5 steps with a railing? : A Little 6 Click Score: 20    End of Session   Activity Tolerance: Patient tolerated treatment well Patient left: in chair;with call bell/phone within reach (fall risk score 8; alarm not required)   PT Visit Diagnosis: Difficulty in walking, not elsewhere classified (R26.2);Muscle weakness (generalized) (M62.81)    Time: [id number] PT Time Calculation (min) (ACUTE ONLY): 23 min   Charges:   PT Evaluation $PT Eval Low Complexity: 1 Low          Jaxan Michel H. Owens Shark, PT, DPT, NCS 05/30/22, 11:25 AM [phone]

## 2022-05-30 NOTE — TOC Benefit Eligibility Note (Signed)
Patient Teacher, English as a foreign language completed.    The patient is currently admitted and upon discharge could be taking vancomycin 125 mg capsules.  The current 10 day co-pay is $4.00.   The patient is currently admitted and upon discharge could be taking Dificid 200 mg tablets.  Requires Prior Authorization  The patient is insured through [employer]   Lyndel Safe, [place] Patient Advocate Specialist [place] Patient Advocate Team Direct Number: [phone]  Fax: [phone]

## 2022-05-30 NOTE — Progress Notes (Signed)
[facility]                                                                               [name], is a 57 y.o. female, DOB - 03-12-1965, UA:[id number] Admit date - 05/28/2022    Outpatient Primary MD for the patient is [name], MD  LOS - 2  days    Brief summary   [name] is a 57 year old female with history of CKD stage IV, bipolar disorder, depression, history of schizophrenia, COPD, GERD, hypertension, stage I obesity, with recent prolonged hospitalization at [facility] in [place] for multiple diagnoses including septic shock, respiratory failure, upper GI bleed, requiring pressors and intubation, and then Streptococcus bacteremia, developing acute kidney injury requiring CRRT treatment.  She was admitted for evaluation and management of colitis.    Assessment & Plan    Assessment and Plan: * SIRS (systemic inflammatory response syndrome) (HCC) Secondary to C diff colitis.  Blood cultures x 2 are in process C diff positive and she was started on oral vancomycin.   C diff Colitis  Started her on oral vancomycin. Advance diet as tolerated.  She continues to have watery diarrhea, some abdominal pain, but better than yesterday.  She reports persistent nausea, no vomiting .  Will put her on scheduled dose of zofran. Worsened leukocytosis , unclear why it worsened.  Will get pro calcitonin. Monitor wbc trend.    Sinus tachycardia In setting of recent prolonged hospitalization, complicated course Check D-dimer, if positive will order a VQ scan, much improved.  D-dimer positive, VQ scan ordered and is negative.  Prn metoprolol.   Hypokalemia Replaced.  Repeat level wnl.    Acute anemia superimposed on Anemia of chronic disease secondary to stage 4 CKD.  1 unit of prbc transfusion ordered.  Anemia panel shows iron deficiency, supplementation will be added on discharge.  Hemoglobin stabilized to 8.3.    Hypomagnesemia Replaced.  Tobacco abuse As needed nicotine patch ordered  Essential hypertension BP parameters have improved,  but still borderline and she is asymptomatic.   Insomnia Trazodone 50 mg nightly resumed  Gastroesophageal reflux disease without esophagitis D/c PPI and started her on Pepcid.   Bipolar depression (HCC) Nortriptyline 25 mg per home dosing resumed Quetiapine 300 mg nightly, trazodone 50 mg nightly, vilazodone 40 mg daily resumed  Hyponatremia:  Suspect from dehydration.  Improved:   COPD: Continue with spiriva and dulera.  No wheezing heard.  On RA.   Stage 4 CKD.  Creatinine stable at 2.5     Estimated body mass index is 28.22 kg/m as calculated from the following:   Height as of this encounter: 4\' 10"  (1.473 m).   Weight as of this encounter: 61.2 kg.  Code Status: full code.  DVT Prophylaxis:  Place TED hose Start: 05/28/22 1736   Level of Care: Level of care: Telemetry Medical Family Communication: None at bedside.   Disposition Plan:     Remains inpatient appropriate:  C diff colitis, pending clinical improvement.   Procedures:  None.   Consultants:   None.   Antimicrobials:   Anti-infectives (From admission, onward)    Start  Dose/Rate Route Frequency Ordered Stop   05/29/22 1800  ceFEPIme (MAXIPIME) 2 g in sodium chloride 0.9 % 100 mL IVPB  Status:  Discontinued        2 g 200 mL/hr over 30 Minutes Intravenous Every 24 hours 05/28/22 1751 05/29/22 1101   05/29/22 1400  vancomycin (VANCOCIN) capsule 125 mg        125 mg Oral 4 times daily 05/29/22 1101 06/08/22 1359   05/29/22 1051  vancomycin variable dose per unstable renal function (pharmacist dosing)  Status:  Discontinued         Does not apply See admin instructions 05/29/22 1051 05/29/22 1247   05/28/22 1800  metroNIDAZOLE (FLAGYL) IVPB 500 mg  Status:  Discontinued        500 mg 100 mL/hr over 60 Minutes Intravenous Every 12 hours 05/28/22 1737 05/29/22  1101   05/28/22 1800  vancomycin (VANCOREADY) IVPB 1500 mg/300 mL        1,500 mg 150 mL/hr over 120 Minutes Intravenous  Once 05/28/22 1751 05/28/22 2247   05/28/22 1645  ceFEPIme (MAXIPIME) 2 g in sodium chloride 0.9 % 100 mL IVPB        2 g 200 mL/hr over 30 Minutes Intravenous  Once 05/28/22 1641 05/28/22 1755   05/28/22 1645  metroNIDAZOLE (FLAGYL) IVPB 500 mg  Status:  Discontinued        500 mg 100 mL/hr over 60 Minutes Intravenous  Once 05/28/22 1641 05/28/22 1737        Medications  Scheduled Meds:  famotidine  20 mg Oral BID   mometasone-formoterol  2 puff Inhalation BID   nortriptyline  25 mg Oral Daily   And   nortriptyline  50 mg Oral QHS   QUEtiapine  300 mg Oral QHS   sodium bicarbonate  650 mg Oral BID   tiotropium  18 mcg Inhalation Daily   traZODone  50 mg Oral QHS   vancomycin  125 mg Oral QID   Vilazodone HCl  40 mg Oral Daily   Continuous Infusions:  sodium chloride 100 mL/hr at 05/30/22 0450   PRN Meds:.acetaminophen **OR** acetaminophen, albuterol, fentaNYL (SUBLIMAZE) injection, fluticasone, metoprolol tartrate, nicotine, ondansetron **OR** ondansetron (ZOFRAN) IV, oxyCODONE    Subjective:   Darlene Barber was seen and examined today. Persistent nausea, No vomiting. Mild abdomina pain.  Continues to have watery diarrhea. . Patient requesting to advance diet.    Objective:   Vitals:   05/29/22 1436 05/29/22 1612 05/30/22 0413 05/30/22 0852  BP: 103/67 115/75 101/73 101/64  Pulse: (!) 101 (!) 102 88 98  Resp: 16 17 20 17   Temp: 98.2 F (36.8 C) (!) 97.5 F (36.4 C) 97.8 F (36.6 C) 97.6 F (36.4 C)  TempSrc: Oral     SpO2: 100% 100% 100% 100%  Weight:      Height:        Intake/Output Summary (Last 24 hours) at 05/30/2022 1351 Last data filed at 05/30/2022 1151 Gross per 24 hour  Intake 1307.8 ml  Output 0 ml  Net 1307.8 ml    Filed Weights   05/28/22 1420  Weight: 61.2 kg     Exam General exam: Appears calm and  comfortable  Respiratory system: Clear to auscultation. Respiratory effort normal. Cardiovascular system: S1 & S2 heard, RRR. No JVD, murmurs,  Gastrointestinal system: Abdomen is soft, mild to  mod tenderness. Bs+ Central nervous system: Alert and oriented. No focal neurological deficits. Extremities: Symmetric 5 x 5 power. Skin: No rashes,  Psychiatry:  Mood & affect appropriate.    Data Reviewed:  I have personally reviewed following labs and imaging studies   CBC Lab Results  Component Value Date   WBC 32.6 (H) 05/30/2022   RBC 2.78 (L) 05/30/2022   HGB 8.3 (L) 05/30/2022   HCT 25.1 (L) 05/30/2022   MCV 90.3 05/30/2022   MCH 29.9 05/30/2022   PLT 661 (H) 05/30/2022   MCHC 33.1 05/30/2022   RDW 18.1 (H) 05/30/2022   LYMPHSABS 2.5 05/30/2022   MONOABS 1.1 (H) 05/30/2022   EOSABS 0.3 05/30/2022   BASOSABS 0.1 [id number]     Last metabolic panel Lab Results  Component Value Date   NA 133 (L) 05/30/2022   K 3.8 05/30/2022   CL 110 05/30/2022   CO2 17 (L) 05/30/2022   BUN 24 (H) 05/30/2022   CREATININE 2.58 (H) 05/30/2022   GLUCOSE 101 (H) 05/30/2022   GFRNONAA 21 (L) 05/30/2022   GFRAA 93 09/30/2019   CALCIUM 8.1 (L) 05/30/2022   PHOS 3.6 05/28/2022   PROT 6.3 (L) 05/28/2022   ALBUMIN 2.3 (L) 05/28/2022   BILITOT 0.5 05/28/2022   ALKPHOS 147 (H) 05/28/2022   AST 14 (L) 05/28/2022   ALT 12 05/28/2022   ANIONGAP 6 05/30/2022    CBG (last 3)  No results for input(s): "GLUCAP" in the last 72 hours.    Coagulation Profile: Recent Labs  Lab 05/29/22 0414  INR 1.4*      Radiology Studies: NM Pulmonary Perfusion  Result Date: 05/29/2022 CLINICAL DATA:  Elevated D-dimer.  Evaluate for pulmonary embolism. EXAM: NUCLEAR MEDICINE PERFUSION LUNG SCAN TECHNIQUE: Perfusion images were obtained in multiple projections after intravenous injection of radiopharmaceutical. Ventilation scans intentionally deferred if perfusion scan and chest x-ray adequate for  interpretation during COVID 19 epidemic. RADIOPHARMACEUTICALS:  4.4 mCi Tc-43m MAA IV COMPARISON:  Chest radiograph-05/28/2022; 03/25/2022 FINDINGS: Review of chest radiograph performed 05/28/2022 demonstrates improved aeration of the lungs with minimal bilateral infrahilar heterogeneous opacities. No discrete focal airspace opacities. No pleural effusion or pneumothorax. No evidence of edema. Old right posterolateral rib fractures. Perfusion images demonstrate homogeneous distribution of injected radiotracer without discrete area of non perfusion to suggest pulmonary embolism. IMPRESSION: Pulmonary embolism absent (very low probability of pulmonary embolism). Electronically Signed   By: Sandi Mariscal M.D.   On: 05/29/2022 15:26   CT ABDOMEN PELVIS WO CONTRAST  Result Date: 05/28/2022 CLINICAL DATA:  Abdominal pain EXAM: CT ABDOMEN AND PELVIS WITHOUT CONTRAST TECHNIQUE: Multidetector CT imaging of the abdomen and pelvis was performed following the standard protocol without IV contrast. RADIATION DOSE REDUCTION: This exam was performed according to the departmental dose-optimization program which includes automated exposure control, adjustment of the mA and/or kV according to patient size and/or use of iterative reconstruction technique. COMPARISON:  04/19/2022 FINDINGS: Lower chest: Small linear densities in the anterior lower lung fields may suggest scarring or subsegmental atelectasis. Hepatobiliary: No focal abnormalities are seen in liver. Surgical clips are seen in gallbladder fossa. There is no dilation of bile ducts. Pancreas: No focal abnormalities are seen. Spleen: Unremarkable. Adrenals/Urinary Tract: Adrenals are unremarkable. There is no hydronephrosis. There are multiple small bilateral renal stones measuring up to 3 mm in size. There is linear high density in the cortex in the medial aspect of upper pole of left kidney. Significance of this finding is not clear. Ureters are not dilated. Urinary  bladder is not distended. Stomach/Bowel: Stomach is not distended. Small bowel loops are not dilated. Appendix is not seen. There  is abnormal diffuse wall thickening in colon, more so in transverse colon. There is pericolic stranding. There is no loculated pericolic fluid collection. Vascular/Lymphatic: Scattered arterial calcifications are seen. No new significant lymphadenopathy is seen. Reproductive: Uterus appears smaller than usual, possibly suggesting partial removal. No adnexal masses are seen. Other: There is no ascites or pneumoperitoneum. Umbilical hernia containing fat is seen. Musculoskeletal: No acute findings are seen. IMPRESSION: There is interval appearance of marked wall thickening and pericolic stranding throughout the colon, more so in transverse colon. Findings suggest severe inflammatory or infectious colitis. There is no loculated pericolic abscess. There is no evidence of intestinal obstruction or pneumoperitoneum. There is no hydronephrosis. Bilateral renal stones. Other findings as described in the body of the report. Electronically Signed   By: Elmer Picker M.D.   On: 05/28/2022 16:20   CT L-SPINE NO CHARGE  Result Date: 05/28/2022 CLINICAL DATA:  Low back pain EXAM: CT LUMBAR SPINE WITHOUT CONTRAST TECHNIQUE: Multidetector CT imaging of the lumbar spine was performed without intravenous contrast administration. Multiplanar CT image reconstructions were also generated. RADIATION DOSE REDUCTION: This exam was performed according to the departmental dose-optimization program which includes automated exposure control, adjustment of the mA and/or kV according to patient size and/or use of iterative reconstruction technique. COMPARISON:  MRI 03/08/2020. FINDINGS: Segmentation: 5 lumbar type vertebral bodies. S1 has some transitional features. Alignment: Exaggerated lumbar lordosis.  No listhesis. Vertebrae: No fracture or focal bone lesion. Paraspinal and other soft tissues: See  results of abdominal CT. Disc levels: No significant finding from T12-L1 through L3-4. L4-5: No disc abnormality. Bilateral facet osteoarthritis which could contribute to low back pain. L5-S1: No disc abnormality. Bilateral facet osteoarthritis which could contribute to regional back pain. S1-2: Transitional level. No disc abnormality or stenosis. Facet arthropathy on the left that could contribute to regional pain. IMPRESSION: 1. No acute or traumatic finding. Exaggerated lumbar lordosis. 2. 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 3. Facet arthropathy on the left at S1-2 that could contribute to regional pain. Electronically Signed   By: Nelson Chimes M.D.   On: 05/28/2022 16:15   DG Chest 2 View  Result Date: 05/28/2022 CLINICAL DATA:  Shortness of breath.  Rule out pneumonia EXAM: CHEST - 2 VIEW COMPARISON:  03/25/2022 FINDINGS: Remote left rib fractures. Midline trachea. Normal heart size and mediastinal contours. No pleural effusion or pneumothorax. Clear lungs. IMPRESSION: No acute cardiopulmonary disease. Electronically Signed   By: Abigail Miyamoto M.D.   On: 05/28/2022 14:50       Hosie Poisson M.D. [facility] 05/30/2022, 1:51 PM  Available via Epic secure chat 7am-7pm After 7 pm, please refer to night coverage provider listed on amion.

## 2022-05-31 DIAGNOSIS — R6510 Systemic inflammatory response syndrome (SIRS) of non-infectious origin without acute organ dysfunction: Secondary | ICD-10-CM | POA: Diagnosis not present

## 2022-05-31 DIAGNOSIS — D649 Anemia, unspecified: Secondary | ICD-10-CM

## 2022-05-31 DIAGNOSIS — E876 Hypokalemia: Secondary | ICD-10-CM | POA: Diagnosis not present

## 2022-05-31 LAB — CBC WITH DIFFERENTIAL/PLATELET
Abs Immature Granulocytes: 0.19 10*3/uL — ABNORMAL HIGH (ref 0.00–0.07)
Basophils Absolute: 0.1 10*3/uL (ref 0.0–0.1)
Basophils Relative: 1 %
Eosinophils Absolute: 0.4 10*3/uL (ref 0.0–0.5)
Eosinophils Relative: 2 %
HCT: 23.7 % — ABNORMAL LOW (ref 36.0–46.0)
Hemoglobin: 7.9 g/dL — ABNORMAL LOW (ref 12.0–15.0)
Immature Granulocytes: 1 %
Lymphocytes Relative: 17 %
Lymphs Abs: 3.1 10*3/uL (ref 0.7–4.0)
MCH: 30.2 pg (ref 26.0–34.0)
MCHC: 33.3 g/dL (ref 30.0–36.0)
MCV: 90.5 fL (ref 80.0–100.0)
Monocytes Absolute: 0.7 10*3/uL (ref 0.1–1.0)
Monocytes Relative: 4 %
Neutro Abs: 13.5 10*3/uL — ABNORMAL HIGH (ref 1.7–7.7)
Neutrophils Relative %: 75 %
Platelets: 652 10*3/uL — ABNORMAL HIGH (ref 150–400)
RBC: 2.62 MIL/uL — ABNORMAL LOW (ref 3.87–5.11)
RDW: 18 % — ABNORMAL HIGH (ref 11.5–15.5)
WBC: 18 10*3/uL — ABNORMAL HIGH (ref 4.0–10.5)
nRBC: 0 % (ref 0.0–0.2)

## 2022-05-31 LAB — BASIC METABOLIC PANEL
Anion gap: 13 (ref 5–15)
BUN: 22 mg/dL — ABNORMAL HIGH (ref 6–20)
CO2: 16 mmol/L — ABNORMAL LOW (ref 22–32)
Calcium: 8.3 mg/dL — ABNORMAL LOW (ref 8.9–10.3)
Chloride: 109 mmol/L (ref 98–111)
Creatinine, Ser: 2.43 mg/dL — ABNORMAL HIGH (ref 0.44–1.00)
GFR, Estimated: 23 mL/min — ABNORMAL LOW (ref >60)
Glucose, Bld: 83 mg/dL (ref 70–99)
Potassium: 3.6 mmol/L (ref 3.5–5.1)
Sodium: 138 mmol/L (ref 135–145)

## 2022-05-31 MED ORDER — FAMOTIDINE 20 MG PO TABS
10.0000 mg | ORAL_TABLET | Freq: Every day | ORAL | Status: DC
  Administered 2022-06-01: 10 mg via ORAL
  Filled 2022-05-31: qty 1

## 2022-05-31 MED ORDER — HYDROXYZINE HCL 25 MG PO TABS: 25.0000 mg | ORAL_TABLET | Freq: Three times a day (TID) | ORAL | Status: DC | PRN

## 2022-05-31 NOTE — Progress Notes (Signed)
Subjective/Chief Complaint: Patient seen with a chief complaint of very painful ingrown toenails.  Concerned that she could be starting to get an infection.  States she has been hospitalized for most of the last 3 months and unable to seek care.   Objective: Vital signs in last 24 hours: Temp:  [97.7 F (36.5 C)] 97.7 F (36.5 C) (03/22 0742) Pulse Rate:  [89-93] 91 (03/22 0742) Resp:  [16-20] 16 (03/22 0742) BP: (99-115)/(67-80) 99/67 (03/22 0742) SpO2:  [100 %] 100 % (03/22 0742) Last BM Date : 05/31/22  Intake/Output from previous day: 03/21 0701 - 03/22 0700 In: 1602.2 [I.V.:1602.2] Out: 3 [Urine:2; Stool:1] Intake/Output this shift: Total I/O In: 240 [P.O.:240] Out: -   All 10 toenails are severely elongated, thick, dystrophic, discolored, brittle with subungual debris.  Some mild erythema along the medial border of the hallux nails but no active drainage.  Lab Results:  Recent Labs    05/30/22 0942 05/31/22 0520  WBC 32.6* 18.0*  HGB 8.3* 7.9*  HCT 25.1* 23.7*  PLT 661* 652*   BMET Recent Labs    05/30/22 0942 05/31/22 0520  NA 133* 138  K 3.8 3.6  CL 110 109  CO2 17* 16*  GLUCOSE 101* 83  BUN 24* 22*  CREATININE 2.58* 2.43*  CALCIUM 8.1* 8.3*   PT/INR Recent Labs    05/29/22 0414  LABPROT 16.9*  INR 1.4*   ABG No results for input(s): "PHART", "HCO3" in the last 72 hours.  Invalid input(s): "PCO2", "PO2"  Studies/Results: DG Abd 1 View  Result Date: 05/30/2022 CLINICAL DATA:  Constipation. EXAM: ABDOMEN - 1 VIEW COMPARISON:  March 30, 2022 FINDINGS: The bowel gas pattern is normal with air-filled loops of transverse colon seen overlying the upper abdomen. A minimal stool burden is noted. Radiopaque surgical clips are seen overlying the right upper quadrant. No radio-opaque calculi or other significant radiographic abnormality are seen. IMPRESSION: Negative. Electronically Signed   By: Virgina Norfolk M.D.   On: 05/30/2022 20:19   NM  Pulmonary Perfusion  Result Date: 05/29/2022 CLINICAL DATA:  Elevated D-dimer.  Evaluate for pulmonary embolism. EXAM: NUCLEAR MEDICINE PERFUSION LUNG SCAN TECHNIQUE: Perfusion images were obtained in multiple projections after intravenous injection of radiopharmaceutical. Ventilation scans intentionally deferred if perfusion scan and chest x-ray adequate for interpretation during COVID 19 epidemic. RADIOPHARMACEUTICALS:  4.4 mCi Tc-44m MAA IV COMPARISON:  Chest radiograph-05/28/2022; 03/25/2022 FINDINGS: Review of chest radiograph performed 05/28/2022 demonstrates improved aeration of the lungs with minimal bilateral infrahilar heterogeneous opacities. No discrete focal airspace opacities. No pleural effusion or pneumothorax. No evidence of edema. Old right posterolateral rib fractures. Perfusion images demonstrate homogeneous distribution of injected radiotracer without discrete area of non perfusion to suggest pulmonary embolism. IMPRESSION: Pulmonary embolism absent (very low probability of pulmonary embolism). Electronically Signed   By: Sandi Mariscal M.D.   On: 05/29/2022 15:26    Anti-infectives: Anti-infectives (From admission, onward)    Start     Dose/Rate Route Frequency Ordered Stop   05/29/22 1800  ceFEPIme (MAXIPIME) 2 g in sodium chloride 0.9 % 100 mL IVPB  Status:  Discontinued        2 g 200 mL/hr over 30 Minutes Intravenous Every 24 hours 05/28/22 1751 05/29/22 1101   05/29/22 1400  vancomycin (VANCOCIN) capsule 125 mg        125 mg Oral 4 times daily 05/29/22 1101 06/08/22 1359   05/29/22 1051  vancomycin variable dose per unstable renal function (pharmacist dosing)  Status:  Discontinued         Does not apply See admin instructions 05/29/22 1051 05/29/22 1247   05/28/22 1800  metroNIDAZOLE (FLAGYL) IVPB 500 mg  Status:  Discontinued        500 mg 100 mL/hr over 60 Minutes Intravenous Every 12 hours 05/28/22 1737 05/29/22 1101   05/28/22 1800  vancomycin (VANCOREADY) IVPB 1500  mg/300 mL        1,500 mg 150 mL/hr over 120 Minutes Intravenous  Once 05/28/22 1751 05/28/22 2247   05/28/22 1645  ceFEPIme (MAXIPIME) 2 g in sodium chloride 0.9 % 100 mL IVPB        2 g 200 mL/hr over 30 Minutes Intravenous  Once 05/28/22 1641 05/28/22 1755   05/28/22 1645  metroNIDAZOLE (FLAGYL) IVPB 500 mg  Status:  Discontinued        500 mg 100 mL/hr over 60 Minutes Intravenous  Once 05/28/22 1641 05/28/22 1737       Assessment/Plan: s/p * No surgery found * Assessment: Painful onychomycosis with onychocryptosis  Plan: Debrided all 10 toenails with care taken to remove the ingrown borders of nails on the great toes.  Patient may follow-up outpatient as needed.  LOS: 3 days    Durward Fortes 05/31/2022

## 2022-05-31 NOTE — Progress Notes (Signed)
[facility]                                                                               Darlene Barber, is a 57 y.o. female, DOB - 1965/10/18, [id number] Admit date - 05/28/2022    Outpatient Primary MD for the patient is [name], MD  LOS - 3  days    Brief summary   Darlene Barber is a 57 year old female with history of CKD stage IV, bipolar disorder, depression, history of schizophrenia, COPD, GERD, hypertension, stage I obesity, with recent prolonged hospitalization at [facility] in [place] for multiple diagnoses including septic shock, respiratory failure, upper GI bleed, requiring pressors and intubation, and then Streptococcus bacteremia, developing acute kidney injury requiring CRRT treatment.  She was admitted for evaluation and management of colitis.    Assessment & Plan    Assessment and Plan: * SIRS (systemic inflammatory response syndrome) (HCC) Secondary to C diff colitis.  Blood cultures x 2 are in process C diff positive and she was started on oral vancomycin.  Leukocytosis improving.   C diff Colitis  Started her on oral vancomycin. Advance diet as tolerated.  Watery diarrhea improved, she reports her BM have more consistency.  Advanced to soft diet.  Leukocytosis improving.   Sinus tachycardia In setting of recent prolonged hospitalization, complicated course Check D-dimer, if positive will order a VQ scan, much improved.  D-dimer positive, VQ scan ordered and is negative.  Prn metoprolol.   Hypokalemia Replaced.  Repeat level wnl.    Acute anemia superimposed on Anemia of chronic disease secondary to stage 4 CKD.  1 unit of prbc transfusion ordered.  Anemia panel shows iron deficiency, supplementation will be added on discharge.  Hemoglobin  post transfusion improved to 8.6 , dropped to 8.3 to 7.9. .   Hypomagnesemia Replaced.   Tobacco abuse As needed nicotine patch ordered  Essential hypertension BP  parameters have improved,  but still borderline and she is asymptomatic.   Insomnia Trazodone 50 mg nightly resumed  Gastroesophageal reflux disease without esophagitis D/c PPI and started her on Pepcid.   Bipolar depression (HCC) Nortriptyline 25 mg per home dosing resumed Quetiapine 300 mg nightly, trazodone 50 mg nightly, vilazodone 40 mg daily resumed  Hyponatremia:  Suspect from dehydration.  Improved:   COPD: Continue with spiriva and dulera.  No wheezing heard.  On RA.   Stage 4 CKD.  Creatinine stable at 2.5  Thrombocytosis:  Platelets around 652000.  Continue to monitor.   Painful onychomycosis with onychocryptosis  Podiatry on board .    Estimated body mass index is 28.22 kg/m as calculated from the following:   Height as of this encounter: 4\' 10"  (1.473 m).   Weight as of this encounter: 61.2 kg.  Code Status: full code.  DVT Prophylaxis:  Place TED hose Start: 05/28/22 1736   Level of Care: Level of care: Telemetry Medical Family Communication: None at bedside.   Disposition Plan:     Remains inpatient appropriate:  pending clinical improvement.   Procedures:  None.   Consultants:   None.   Antimicrobials:   Anti-infectives (From admission, onward)    Start  Dose/Rate Route Frequency Ordered Stop   05/29/22 1800  ceFEPIme (MAXIPIME) 2 g in sodium chloride 0.9 % 100 mL IVPB  Status:  Discontinued        2 g 200 mL/hr over 30 Minutes Intravenous Every 24 hours 05/28/22 1751 05/29/22 1101   05/29/22 1400  vancomycin (VANCOCIN) capsule 125 mg        125 mg Oral 4 times daily 05/29/22 1101 06/08/22 1359   05/29/22 1051  vancomycin variable dose per unstable renal function (pharmacist dosing)  Status:  Discontinued         Does not apply See admin instructions 05/29/22 1051 05/29/22 1247   05/28/22 1800  metroNIDAZOLE (FLAGYL) IVPB 500 mg  Status:  Discontinued        500 mg 100 mL/hr over 60 Minutes Intravenous Every 12 hours 05/28/22 1737  05/29/22 1101   05/28/22 1800  vancomycin (VANCOREADY) IVPB 1500 mg/300 mL        1,500 mg 150 mL/hr over 120 Minutes Intravenous  Once 05/28/22 1751 05/28/22 2247   05/28/22 1645  ceFEPIme (MAXIPIME) 2 g in sodium chloride 0.9 % 100 mL IVPB        2 g 200 mL/hr over 30 Minutes Intravenous  Once 05/28/22 1641 05/28/22 1755   05/28/22 1645  metroNIDAZOLE (FLAGYL) IVPB 500 mg  Status:  Discontinued        500 mg 100 mL/hr over 60 Minutes Intravenous  Once 05/28/22 1641 05/28/22 1737        Medications  Scheduled Meds:  [START ON 06/01/2022] famotidine  10 mg Oral Daily   mometasone-formoterol  2 puff Inhalation BID   nortriptyline  25 mg Oral Daily   And   nortriptyline  50 mg Oral QHS   ondansetron  4 mg Oral Q8H   QUEtiapine  300 mg Oral QHS   sodium bicarbonate  650 mg Oral BID   tiotropium  18 mcg Inhalation Daily   traZODone  50 mg Oral QHS   vancomycin  125 mg Oral QID   Vilazodone HCl  40 mg Oral Daily   Continuous Infusions:  sodium chloride 100 mL/hr at 05/31/22 0502   PRN Meds:.acetaminophen **OR** acetaminophen, albuterol, fentaNYL (SUBLIMAZE) injection, fluticasone, hydrOXYzine, metoprolol tartrate, nicotine, oxyCODONE, phenol, simethicone    Subjective:   Darlene Barber was seen and examined today. Nausea is better. Abd pain improving.  No vomiting.    Objective:   Vitals:   05/30/22 0852 05/30/22 1643 05/30/22 2152 05/31/22 0742  BP: 101/64 115/76 113/80 99/67  Pulse: 98 89 93 91  Resp: 17 18 20 16   Temp: 97.6 F (36.4 C)  97.7 F (36.5 C) 97.7 F (36.5 C)  TempSrc:      SpO2: 100% 100% 100% 100%  Weight:      Height:        Intake/Output Summary (Last 24 hours) at 05/31/2022 1617 Last data filed at 05/31/2022 1300 Gross per 24 hour  Intake 2082.18 ml  Output 1 ml  Net 2081.18 ml    Filed Weights   05/28/22 1420  Weight: 61.2 kg     Exam General exam: Appears calm and comfortable  Respiratory system: Clear to auscultation.  Respiratory effort normal. Cardiovascular system: S1 & S2 heard, RRR. No JVD,  Gastrointestinal system: Abdomen is soft mildly tender. Bs+ Central nervous system: Alert and oriented. No focal neurological deficits. Extremities: Symmetric 5 x 5 power. Skin: No rashes,  Psychiatry:  Mood & affect appropriate.  Data Reviewed:  I have personally reviewed following labs and imaging studies   CBC Lab Results  Component Value Date   WBC 18.0 (H) 05/31/2022   RBC 2.62 (L) 05/31/2022   HGB 7.9 (L) 05/31/2022   HCT 23.7 (L) 05/31/2022   MCV 90.5 05/31/2022   MCH 30.2 05/31/2022   PLT 652 (H) 05/31/2022   MCHC 33.3 05/31/2022   RDW 18.0 (H) 05/31/2022   LYMPHSABS 3.1 05/31/2022   MONOABS 0.7 05/31/2022   EOSABS 0.4 05/31/2022   BASOSABS 0.1 [id number]     Last metabolic panel Lab Results  Component Value Date   NA 138 05/31/2022   K 3.6 05/31/2022   CL 109 05/31/2022   CO2 16 (L) 05/31/2022   BUN 22 (H) 05/31/2022   CREATININE 2.43 (H) 05/31/2022   GLUCOSE 83 05/31/2022   GFRNONAA 23 (L) 05/31/2022   GFRAA 93 09/30/2019   CALCIUM 8.3 (L) 05/31/2022   PHOS 3.6 05/28/2022   PROT 6.3 (L) 05/28/2022   ALBUMIN 2.3 (L) 05/28/2022   BILITOT 0.5 05/28/2022   ALKPHOS 147 (H) 05/28/2022   AST 14 (L) 05/28/2022   ALT 12 05/28/2022   ANIONGAP 13 05/31/2022    CBG (last 3)  No results for input(s): "GLUCAP" in the last 72 hours.    Coagulation Profile: Recent Labs  Lab 05/29/22 0414  INR 1.4*      Radiology Studies: DG Abd 1 View  Result Date: 05/30/2022 CLINICAL DATA:  Constipation. EXAM: ABDOMEN - 1 VIEW COMPARISON:  March 30, 2022 FINDINGS: The bowel gas pattern is normal with air-filled loops of transverse colon seen overlying the upper abdomen. A minimal stool burden is noted. Radiopaque surgical clips are seen overlying the right upper quadrant. No radio-opaque calculi or other significant radiographic abnormality are seen. IMPRESSION: Negative.  Electronically Signed   By: Virgina Norfolk M.D.   On: 05/30/2022 20:19       Hosie Poisson M.D. [facility] 05/31/2022, 4:17 PM  Available via Epic secure chat 7am-7pm After 7 pm, please refer to night coverage provider listed on amion.

## 2022-06-01 DIAGNOSIS — D649 Anemia, unspecified: Secondary | ICD-10-CM | POA: Diagnosis not present

## 2022-06-01 DIAGNOSIS — R6510 Systemic inflammatory response syndrome (SIRS) of non-infectious origin without acute organ dysfunction: Secondary | ICD-10-CM | POA: Diagnosis not present

## 2022-06-01 DIAGNOSIS — E440 Moderate protein-calorie malnutrition: Secondary | ICD-10-CM

## 2022-06-01 LAB — BASIC METABOLIC PANEL
Anion gap: 5 (ref 5–15)
BUN: 19 mg/dL (ref 6–20)
CO2: 17 mmol/L — ABNORMAL LOW (ref 22–32)
Calcium: 8.1 mg/dL — ABNORMAL LOW (ref 8.9–10.3)
Chloride: 115 mmol/L — ABNORMAL HIGH (ref 98–111)
Creatinine, Ser: 2.39 mg/dL — ABNORMAL HIGH (ref 0.44–1.00)
GFR, Estimated: 23 mL/min — ABNORMAL LOW (ref >60)
Glucose, Bld: 118 mg/dL — ABNORMAL HIGH (ref 70–99)
Potassium: 4 mmol/L (ref 3.5–5.1)
Sodium: 137 mmol/L (ref 135–145)

## 2022-06-01 LAB — CBC WITH DIFFERENTIAL/PLATELET
Abs Immature Granulocytes: 0.2 10*3/uL — ABNORMAL HIGH (ref 0.00–0.07)
Basophils Absolute: 0.1 10*3/uL (ref 0.0–0.1)
Basophils Relative: 1 %
Eosinophils Absolute: 0.4 10*3/uL (ref 0.0–0.5)
Eosinophils Relative: 4 %
HCT: 24.9 % — ABNORMAL LOW (ref 36.0–46.0)
Hemoglobin: 8.2 g/dL — ABNORMAL LOW (ref 12.0–15.0)
Immature Granulocytes: 2 %
Lymphocytes Relative: 22 %
Lymphs Abs: 2.2 10*3/uL (ref 0.7–4.0)
MCH: 30 pg (ref 26.0–34.0)
MCHC: 32.9 g/dL (ref 30.0–36.0)
MCV: 91.2 fL (ref 80.0–100.0)
Monocytes Absolute: 0.5 10*3/uL (ref 0.1–1.0)
Monocytes Relative: 5 %
Neutro Abs: 6.8 10*3/uL (ref 1.7–7.7)
Neutrophils Relative %: 66 %
Platelets: 733 10*3/uL — ABNORMAL HIGH (ref 150–400)
RBC: 2.73 MIL/uL — ABNORMAL LOW (ref 3.87–5.11)
RDW: 17.8 % — ABNORMAL HIGH (ref 11.5–15.5)
WBC: 10.1 10*3/uL (ref 4.0–10.5)
nRBC: 0 % (ref 0.0–0.2)

## 2022-06-01 MED ORDER — VILAZODONE HCL 20 MG PO TABS
40.0000 mg | ORAL_TABLET | Freq: Every day | ORAL | Status: DC
  Administered 2022-06-01: 40 mg via ORAL
  Filled 2022-06-01 (×2): qty 2

## 2022-06-01 MED ORDER — ACYCLOVIR 200 MG PO CAPS
400.0000 mg | ORAL_CAPSULE | Freq: Two times a day (BID) | ORAL | Status: DC
  Administered 2022-06-01: 400 mg via ORAL
  Filled 2022-06-01: qty 2

## 2022-06-01 MED ORDER — TRAZODONE HCL 50 MG PO TABS: 50.0000 mg | ORAL_TABLET | Freq: Every evening | ORAL | 11 refills | Status: DC | PRN

## 2022-06-01 MED ORDER — VANCOMYCIN HCL 125 MG PO CAPS: 125.0000 mg | ORAL_CAPSULE | Freq: Four times a day (QID) | ORAL | 0 refills | Status: AC

## 2022-06-01 MED ORDER — METHOCARBAMOL 500 MG PO TABS: 500.0000 mg | ORAL_TABLET | Freq: Three times a day (TID) | ORAL | Status: DC | PRN

## 2022-06-01 MED ORDER — ACYCLOVIR 200 MG PO CAPS: 400.0000 mg | ORAL_CAPSULE | Freq: Two times a day (BID) | ORAL | Status: DC

## 2022-06-01 MED ORDER — MENTHOL 3 MG MT LOZG
1.0000 | LOZENGE | OROMUCOSAL | Status: DC | PRN
  Administered 2022-06-01: 3 mg via ORAL
  Filled 2022-06-01: qty 9

## 2022-06-01 MED ORDER — METHOCARBAMOL 500 MG PO TABS: 500.0000 mg | ORAL_TABLET | Freq: Every day | ORAL | 0 refills | Status: AC

## 2022-06-01 MED ORDER — FAMOTIDINE 10 MG PO TABS: 10.0000 mg | ORAL_TABLET | Freq: Every day | ORAL | 0 refills | Status: DC

## 2022-06-01 MED ORDER — BIOTENE DRY MOUTH MT LIQD: 15.0000 mL | OROMUCOSAL | Status: DC | PRN

## 2022-06-01 MED ORDER — TIOTROPIUM BROMIDE MONOHYDRATE 18 MCG IN CAPS: 18.0000 ug | ORAL_CAPSULE | Freq: Every day | RESPIRATORY_TRACT | 12 refills | Status: DC

## 2022-06-01 MED ORDER — OXYCODONE HCL 5 MG PO TABS: 5.0000 mg | ORAL_TABLET | Freq: Four times a day (QID) | ORAL | 0 refills | Status: AC | PRN

## 2022-06-01 NOTE — TOC Transition Note (Signed)
Transition of Care [facility]) - CM/SW Discharge Note   Patient Details  Name: Darlene Barber MRN: [id number] Date of Birth: [birth year]  Transition of [facility] CM/SW Contact:  Raina Mina, [place] Phone Number: 06/01/2022, 1:27 PM   Clinical Narrative:  CSW spoke with patient who would like home health PT. Patient stated she was told by Ardelle Anton that she would have to give up her whole SSI check if she decides to have physical therapy come to her home. CSW explained to patient that home health PT is filed with her insurance. CSW told patient if she goes to a SNF facility for rehab then she would have to turn her SSI check over to the facility. Patient voiced understanding. CSW spoke with Corene Cornea at [facility] who could possibly take patient but stated she should be active with [facility]. Merleen Nicely with [facility] will let CSW know if they service this patient.            Patient Goals and CMS Choice      Discharge Placement                         Discharge Plan and Services Additional resources added to the After Visit Summary for                                       Social Determinants of Health (SDOH) Interventions SDOH Screenings   Food Insecurity: No Food Insecurity (05/28/2022)  Housing: Low Risk  (05/28/2022)  Transportation Needs: No Transportation Needs (05/28/2022)  Utilities: Not At Risk (05/28/2022)  Depression (PHQ2-9): High Risk (09/03/2021)  Tobacco Use: High Risk (05/28/2022)     Readmission Risk Interventions    03/11/2022   12:18 PM  Readmission Risk Prevention Plan  Transportation Screening Complete  Medication Review ([place]) Complete  PCP or Specialist appointment within 3-5 days of discharge Complete  HRI or [place] Complete  SW Recovery Care/Counseling Consult Complete  [place] Not Applicable

## 2022-06-01 NOTE — Progress Notes (Signed)
Mobility Specialist - Progress Note    06/01/22 0931  Mobility  Activity Ambulated with assistance in hallway;Stood at bedside;Dangled on edge of bed  Level of Assistance Contact guard assist, steadying assist  Assistive Device Front wheel walker  Distance Ambulated (ft) 140 ft  Range of Motion/Exercises Active  Activity Response Tolerated well  Mobility Referral Yes  $Mobility charge 1 Mobility   Pt resting in bed on RA upon entry. Pt STS and ambulates to hallway around NS for 1 lap with AD. Pt returned to bed and left with needs in reach and bed alarm activated.   Loma Sender Mobility Specialist 06/01/22, 9:34 AM

## 2022-06-01 NOTE — Progress Notes (Signed)
Physical Therapy Treatment Patient Details Name: Darlene Barber MRN: [id number] DOB: Jun 05, 1965 Today's Date: 06/01/2022   History of Present Illness presented to ER secondary to abdominal pain; admitted for management of SIRS.  Of note, patient with recent hospitalization at [facility] secondary to septic shock, respiratory failure, GIB and bacteremia (discharged to STR, had completed/returned home)    PT Comments    Pt performed stair negotiation (12 steps) at supervision level to prepare for d/c home.  Pt demonstrates ModI for bed mobility and supervision with transfers and gait with RW.  Pt was overall steady with functional mobility. Pt would benefit from continued balance and strength training with HHPT.  Currently there are no barriers to d/c home from PT standpoint.  Recommendations for follow up therapy are one component of a multi-disciplinary discharge planning process, led by the attending physician.  Recommendations may be updated based on patient status, additional functional criteria and insurance authorization.  Follow Up Recommendations  Home health PT     Assistance Recommended at Discharge PRN  Patient can return home with the following A little help with walking and/or transfers;A little help with bathing/dressing/bathroom   Equipment Recommendations  None recommended by PT    Recommendations for Other Services       Precautions / Restrictions Precautions Precautions: None Restrictions Weight Bearing Restrictions: No     Mobility  Bed Mobility Overal bed mobility: Modified Independent Bed Mobility: Supine to Sit     Supine to sit: Modified independent (Device/Increase time)          Transfers Overall transfer level: Needs assistance Equipment used: Rolling walker (2 wheels) Transfers: Sit to/from Stand, Bed to chair/wheelchair/BSC Sit to Stand: Supervision Stand pivot transfers: Supervision Step pivot transfers: Supervision       General transfer  comment: cues needed for safety with RW when turning with transfers.    Ambulation/Gait Ambulation/Gait assistance: Supervision Gait Distance (Feet): 130 Feet Assistive device: Rolling walker (2 wheels) Gait Pattern/deviations: WFL(Within Functional Limits)       General Gait Details: reciprocal stepping pattern with mild foward trunk flexion; increased time/effort for turn negotiation, but no overt buckling or LOB   Stairs Stairs: Yes Stairs assistance: Supervision Stair Management: One rail Right, Alternating pattern, Step to pattern (pt would alt. between patterns. bilateral UEs holding 1 rail.) Number of Stairs: 12 General stair comments: steady, no LOB   Wheelchair Mobility    Modified Rankin (Stroke Patients Only)       Balance Overall balance assessment: Modified Independent Sitting-balance support: No upper extremity supported, Feet unsupported Sitting balance-Leahy Scale: Normal     Standing balance support: Single extremity supported Standing balance-Leahy Scale: Good Standing balance comment: pt self corrected small LOB when performing functional task with bil. UEs.  Pt is more steady with at least 1 UE support.                            Cognition Arousal/Alertness: Awake/alert Behavior During Therapy: WFL for tasks assessed/performed Overall Cognitive Status: Within Functional Limits for tasks assessed                                 General Comments: generally hyperverbal, min cuing to maintain topic of conversation        Exercises      General Comments        Pertinent Vitals/Pain Pain Assessment Pain  Score: 7  Pain Location: abdomen Pain Descriptors / Indicators: Aching, Guarding, Grimacing Pain Intervention(s): Monitored during session, Limited activity within patient's tolerance, Premedicated before session    Home Living                          Prior Function            PT Goals (current  goals can now be found in the care plan section) Acute Rehab PT Goals Patient Stated Goal: to return home PT Goal Formulation: With patient Time For Goal Achievement: 06/13/22 Potential to Achieve Goals: Good    Frequency    Min 2X/week      PT Plan Current plan remains appropriate    Co-evaluation              AM-PAC PT "6 Clicks" Mobility   Outcome Measure  Help needed turning from your back to your side while in a flat bed without using bedrails?: None Help needed moving from lying on your back to sitting on the side of a flat bed without using bedrails?: None Help needed moving to and from a bed to a chair (including a wheelchair)?: A Little Help needed standing up from a chair using your arms (e.g., wheelchair or bedside chair)?: None Help needed to walk in hospital room?: A Little Help needed climbing 3-5 steps with a railing? : A Little 6 Click Score: 21    End of Session Equipment Utilized During Treatment: Gait belt Activity Tolerance: Patient tolerated treatment well Patient left: in chair;with call bell/phone within reach;with chair alarm set   PT Visit Diagnosis: Difficulty in walking, not elsewhere classified (R26.2);Muscle weakness (generalized) (M62.81)     Time: [id number] PT Time Calculation (min) (ACUTE ONLY): 23 min  Charges:  $Gait Training: 8-22 mins $Therapeutic Activity: 8-22 mins                     Bjorn Loser, PTA  06/01/22, 11:58 AM

## 2022-06-02 LAB — CULTURE, BLOOD (ROUTINE X 2)
Culture: NO GROWTH
Culture: NO GROWTH
Special Requests: ADEQUATE

## 2022-06-03 NOTE — Discharge Summary (Signed)
Physician Discharge Summary   Patient: Darlene Barber MRN: [id number] DOB: 07-13-1965  Admit date:     05/28/2022  Discharge date: 06/01/2022  Discharge Physician: Hosie Poisson   PCP: [name], MD   Recommendations at discharge:  Please follow up with PCP  in one week.  Please follow up with cbc and bmp in one week.   Discharge Diagnoses: Principal Problem:   SIRS (systemic inflammatory response syndrome) (HCC) Active Problems:   Bipolar depression (HCC)   Gastroesophageal reflux disease without esophagitis   Insomnia   Essential hypertension   Tobacco abuse   Hypomagnesemia   Acute on chronic anemia   Hypokalemia   Sinus tachycardia   Diarrhea   Malnutrition of moderate degree    Hospital Course:  Darlene Barber is a 57 year old female with history of CKD stage IV, bipolar disorder, depression, history of schizophrenia, COPD, GERD, hypertension, stage I obesity, with recent prolonged hospitalization at [facility] in [place] for multiple diagnoses including septic shock, respiratory failure, upper GI bleed, requiring pressors and intubation, and then Streptococcus bacteremia, developing acute kidney injury requiring CRRT treatment.   She was admitted for evaluation and management of colitis.    Assessment and Plan:   SIRS (systemic inflammatory response syndrome) (HCC) Secondary to C diff colitis.  Blood cultures x 2 are in process C diff positive and she was started on oral vancomycin. Complete the course of oral vancomycin.  Leukocytosis improving.    C diff Colitis  Started her on oral vancomycin. Advance diet as tolerated.  Watery diarrhea improved, she reports her BM have more consistency.  Advanced to soft diet.  Leukocytosis improving.    Sinus tachycardia In setting of recent prolonged hospitalization, complicated course Check D-dimer, if positive will order a VQ scan, much improved.  D-dimer positive, VQ scan ordered and is negative.   Prn metoprolol.    Hypokalemia Replaced.  Repeat level wnl.     Acute anemia superimposed on Anemia of chronic disease secondary to stage 4 CKD.  1 unit of prbc transfusion ordered.  Anemia panel shows iron deficiency, supplementation will be added on discharge.  Hemoglobin  post transfusion improved to 8.6 , dropped to 8.3 to 7.9. .  Recommend follow up with gastroenterology as scheduled.    Hypomagnesemia Replaced.    Tobacco abuse As needed nicotine patch ordered   Essential hypertension BP parameters have improved,     Insomnia Trazodone 50 mg nightly resumed   Gastroesophageal reflux disease without esophagitis D/c PPI and started her on Pepcid.    Bipolar depression (HCC) Nortriptyline 25 mg per home dosing resumed Quetiapine 300 mg nightly, trazodone 50 mg nightly, vilazodone 40 mg daily resumed   Hyponatremia:  Suspect from dehydration.  Improved:    COPD: Continue with spiriva and dulera.  No wheezing heard.  On RA.    Stage 4 CKD.  Creatinine stable at 2.5   Thrombocytosis:  Platelets around 652000.  Continue to monitor.    Painful onychomycosis with onychocryptosis  Podiatry on board .      Estimated body mass index is 28.22 kg/m as calculated from the following:   Height as of this encounter: 4\' 10"  (1.473 m).   Weight as of this encounter: 61.2 kg.  Consultants: none.  Procedures performed: none.   Disposition: Home Diet recommendation:  Discharge Diet Orders (From admission, onward)     Start     Ordered   06/01/22 0000  Diet - low sodium heart healthy  06/01/22 1431           Regular diet DISCHARGE MEDICATION: Allergies as of 06/01/2022       Reactions   Piper Other (See Comments)   Black pepper Feels like throat is closing, itchy Feels like throat is closing, itchy Feels like throat is closing, itchy   Tape Rash   Paper tape is ok to use.        Medication List     STOP taking these medications     amLODipine 10 MG tablet Commonly known as: NORVASC   pantoprazole 40 MG tablet Commonly known as: PROTONIX       TAKE these medications    (feeding supplement) PROSource Plus liquid Take 30 mLs by mouth 2 (two) times daily between meals.   acyclovir 200 MG capsule Commonly known as: ZOVIRAX Take 2 capsules (400 mg total) by mouth 2 (two) times daily.   albuterol 108 (90 Base) MCG/ACT inhaler Commonly known as: Ventolin HFA INHALE 2 PUFFS EVERY FOUR HOURS AS NEEDED   Emgality 120 MG/ML Sosy Generic drug: Galcanezumab-gnlm Inject into the skin.   famotidine 10 MG tablet Commonly known as: PEPCID Take 1 tablet (10 mg total) by mouth daily.   fexofenadine 180 MG tablet Commonly known as: ALLEGRA Take 180 mg by mouth daily.   fluticasone 50 MCG/ACT nasal spray Commonly known as: FLONASE 1 SPARY IN EACH NOSTRIL DAILY   lidocaine 5 % Commonly known as: LIDODERM Place 1 patch onto the skin daily. Remove & Discard patch within 12 hours or as directed by MD   loperamide 2 MG capsule Commonly known as: IMODIUM Take 1 capsule (2 mg total) by mouth as needed for diarrhea or loose stools.   methocarbamol 500 MG tablet Commonly known as: ROBAXIN Take 1 tablet (500 mg total) by mouth daily for 10 days.   multivitamin Tabs tablet Take 1 tablet by mouth at bedtime.   nortriptyline 25 MG capsule Commonly known as: PAMELOR Take 25 mg by mouth See admin instructions. Take 25 mg (1 capsule) by mouth every morning, and 50 mg (2 capsules) every night at bedtime.   ondansetron 4 MG disintegrating tablet Commonly known as: ZOFRAN-ODT Take by mouth. Take 1 tablet (4 mg total) by mouth every 8 (eight) hours as needed for Nausea for up to 40 days   oxyCODONE 5 MG immediate release tablet Commonly known as: Oxy IR/ROXICODONE Take 1 tablet (5 mg total) by mouth every 6 (six) hours as needed for up to 3 days for moderate pain or severe pain (5mg  for moderate pain, 10mg  for severe  pain).   promethazine 25 MG tablet Commonly known as: PHENERGAN Take 1 tablet (25 mg total) by mouth every 6 (six) hours as needed for nausea or vomiting.   QUEtiapine 300 MG tablet Commonly known as: SEROQUEL Take 300 mg by mouth at bedtime. What changed: Another medication with the same name was removed. Continue taking this medication, and follow the directions you see here.   sodium bicarbonate 650 MG tablet Take 1 tablet (650 mg total) by mouth 2 (two) times daily.   tiotropium 18 MCG inhalation capsule Commonly known as: SPIRIVA Place 1 capsule (18 mcg total) into inhaler and inhale daily.   traZODone 50 MG tablet Commonly known as: DESYREL Take 1 tablet (50 mg total) by mouth at bedtime as needed for sleep. What changed:  when to take this reasons to take this   vancomycin 125 MG capsule Commonly known as: VANCOCIN Take  1 capsule (125 mg total) by mouth 4 (four) times daily for 7 days.   Vilazodone HCl 40 MG Tabs Commonly known as: VIIBRYD Take 40 mg by mouth daily.        Follow-up Information     [name], MD. Schedule an appointment as soon as possible for a visit in 1 week(s).   Specialty: Internal Medicine Contact information: [place] [address] [phone]                Discharge Exam: Danley Danker Weights   05/28/22 1420  Weight: 61.2 kg   General exam: Appears calm and comfortable  Respiratory system: Clear to auscultation. Respiratory effort normal. Cardiovascular system: S1 & S2 heard, RRR. No JVD, No pedal edema. Gastrointestinal system: Abdomen is nondistended, soft and nontender.  Central nervous system: Alert and oriented. No focal neurological deficits. Extremities: Symmetric 5 x 5 power. Skin: No rashes, lesions or ulcers Psychiatry:  Mood & affect appropriate.    Condition at discharge: fair  The results of significant diagnostics from this hospitalization (including imaging, microbiology,  ancillary and laboratory) are listed below for reference.   Imaging Studies: DG Abd 1 View  Result Date: 05/30/2022 CLINICAL DATA:  Constipation. EXAM: ABDOMEN - 1 VIEW COMPARISON:  March 30, 2022 FINDINGS: The bowel gas pattern is normal with air-filled loops of transverse colon seen overlying the upper abdomen. A minimal stool burden is noted. Radiopaque surgical clips are seen overlying the right upper quadrant. No radio-opaque calculi or other significant radiographic abnormality are seen. IMPRESSION: Negative. Electronically Signed   By: Virgina Norfolk M.D.   On: 05/30/2022 20:19   NM Pulmonary Perfusion  Result Date: 05/29/2022 CLINICAL DATA:  Elevated D-dimer.  Evaluate for pulmonary embolism. EXAM: NUCLEAR MEDICINE PERFUSION LUNG SCAN TECHNIQUE: Perfusion images were obtained in multiple projections after intravenous injection of radiopharmaceutical. Ventilation scans intentionally deferred if perfusion scan and chest x-ray adequate for interpretation during COVID 19 epidemic. RADIOPHARMACEUTICALS:  4.4 mCi Tc-36m MAA IV COMPARISON:  Chest radiograph-05/28/2022; 03/25/2022 FINDINGS: Review of chest radiograph performed 05/28/2022 demonstrates improved aeration of the lungs with minimal bilateral infrahilar heterogeneous opacities. No discrete focal airspace opacities. No pleural effusion or pneumothorax. No evidence of edema. Old right posterolateral rib fractures. Perfusion images demonstrate homogeneous distribution of injected radiotracer without discrete area of non perfusion to suggest pulmonary embolism. IMPRESSION: Pulmonary embolism absent (very low probability of pulmonary embolism). Electronically Signed   By: Sandi Mariscal M.D.   On: 05/29/2022 15:26   CT ABDOMEN PELVIS WO CONTRAST  Result Date: 05/28/2022 CLINICAL DATA:  Abdominal pain EXAM: CT ABDOMEN AND PELVIS WITHOUT CONTRAST TECHNIQUE: Multidetector CT imaging of the abdomen and pelvis was performed following the standard  protocol without IV contrast. RADIATION DOSE REDUCTION: This exam was performed according to the departmental dose-optimization program which includes automated exposure control, adjustment of the mA and/or kV according to patient size and/or use of iterative reconstruction technique. COMPARISON:  04/19/2022 FINDINGS: Lower chest: Small linear densities in the anterior lower lung fields may suggest scarring or subsegmental atelectasis. Hepatobiliary: No focal abnormalities are seen in liver. Surgical clips are seen in gallbladder fossa. There is no dilation of bile ducts. Pancreas: No focal abnormalities are seen. Spleen: Unremarkable. Adrenals/Urinary Tract: Adrenals are unremarkable. There is no hydronephrosis. There are multiple small bilateral renal stones measuring up to 3 mm in size. There is linear high density in the cortex in the medial aspect of upper pole of left kidney. Significance of  this finding is not clear. Ureters are not dilated. Urinary bladder is not distended. Stomach/Bowel: Stomach is not distended. Small bowel loops are not dilated. Appendix is not seen. There is abnormal diffuse wall thickening in colon, more so in transverse colon. There is pericolic stranding. There is no loculated pericolic fluid collection. Vascular/Lymphatic: Scattered arterial calcifications are seen. No new significant lymphadenopathy is seen. Reproductive: Uterus appears smaller than usual, possibly suggesting partial removal. No adnexal masses are seen. Other: There is no ascites or pneumoperitoneum. Umbilical hernia containing fat is seen. Musculoskeletal: No acute findings are seen. IMPRESSION: There is interval appearance of marked wall thickening and pericolic stranding throughout the colon, more so in transverse colon. Findings suggest severe inflammatory or infectious colitis. There is no loculated pericolic abscess. There is no evidence of intestinal obstruction or pneumoperitoneum. There is no  hydronephrosis. Bilateral renal stones. Other findings as described in the body of the report. Electronically Signed   By: Elmer Picker M.D.   On: 05/28/2022 16:20   CT L-SPINE NO CHARGE  Result Date: 05/28/2022 CLINICAL DATA:  Low back pain EXAM: CT LUMBAR SPINE WITHOUT CONTRAST TECHNIQUE: Multidetector CT imaging of the lumbar spine was performed without intravenous contrast administration. Multiplanar CT image reconstructions were also generated. RADIATION DOSE REDUCTION: This exam was performed according to the departmental dose-optimization program which includes automated exposure control, adjustment of the mA and/or kV according to patient size and/or use of iterative reconstruction technique. COMPARISON:  MRI 03/08/2020. FINDINGS: Segmentation: 5 lumbar type vertebral bodies. S1 has some transitional features. Alignment: Exaggerated lumbar lordosis.  No listhesis. Vertebrae: No fracture or focal bone lesion. Paraspinal and other soft tissues: See results of abdominal CT. Disc levels: No significant finding from T12-L1 through L3-4. L4-5: No disc abnormality. Bilateral facet osteoarthritis which could contribute to low back pain. L5-S1: No disc abnormality. Bilateral facet osteoarthritis which could contribute to regional back pain. S1-2: Transitional level. No disc abnormality or stenosis. Facet arthropathy on the left that could contribute to regional pain. IMPRESSION: 1. No acute or traumatic finding. Exaggerated lumbar lordosis. 2. 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 3. Facet arthropathy on the left at S1-2 that could contribute to regional pain. Electronically Signed   By: Nelson Chimes M.D.   On: 05/28/2022 16:15   DG Chest 2 View  Result Date: 05/28/2022 CLINICAL DATA:  Shortness of breath.  Rule out pneumonia EXAM: CHEST - 2 VIEW COMPARISON:  03/25/2022 FINDINGS: Remote left rib fractures. Midline trachea. Normal heart size and  mediastinal contours. No pleural effusion or pneumothorax. Clear lungs. IMPRESSION: No acute cardiopulmonary disease. Electronically Signed   By: Abigail Miyamoto M.D.   On: 05/28/2022 14:50    Microbiology: Results for orders placed or performed during the hospital encounter of 05/28/22  Culture, blood (routine x 2)     Status: None   Collection Time: 05/28/22  2:26 PM   Specimen: BLOOD  Result Value Ref Range Status   Specimen Description BLOOD RIGHT ANTECUBITAL  Final   Special Requests   Final    BOTTLES DRAWN AEROBIC AND ANAEROBIC Blood Culture results may not be optimal due to an excessive volume of blood received in culture bottles   Culture   Final    NO GROWTH 5 DAYS Performed at [facility], [address] [place], [place] [address]    Report Status 06/02/2022 FINAL  Final  Culture, blood (routine x 2)     Status:  None   Collection Time: 05/28/22  5:14 PM   Specimen: BLOOD  Result Value Ref Range Status   Specimen Description BLOOD BLOOD RIGHT ARM  Final   Special Requests   Final    BOTTLES DRAWN AEROBIC AND ANAEROBIC Blood Culture adequate volume   Culture   Final    NO GROWTH 5 DAYS Performed at [facility], [address] [place], [place] [address]    Report Status 06/02/2022 FINAL  Final  C Difficile Quick Screen w PCR reflex     Status: Abnormal   Collection Time: 05/29/22  6:00 AM   Specimen: STOOL  Result Value Ref Range Status   C Diff antigen POSITIVE (A) NEGATIVE Final   C Diff toxin POSITIVE (A) NEGATIVE Final   C Diff interpretation Toxin producing C. difficile detected.  Final    Comment: CRITICAL RESULT CALLED TO, READ BACK BY AND VERIFIED WITH: BERNA M. RN [id number] 05/29/22 HNM Performed at [facility], [address] [place], [place] [address]   Gastrointestinal Panel by PCR , Stool     Status: None   Collection Time: 05/29/22  6:00 AM   Specimen: Stool  Result Value Ref Range Status   Campylobacter species NOT DETECTED  NOT DETECTED Final   Plesimonas shigelloides NOT DETECTED NOT DETECTED Final   Salmonella species NOT DETECTED NOT DETECTED Final   Yersinia enterocolitica NOT DETECTED NOT DETECTED Final   Vibrio species NOT DETECTED NOT DETECTED Final   Vibrio cholerae NOT DETECTED NOT DETECTED Final   Enteroaggregative E coli (EAEC) NOT DETECTED NOT DETECTED Final   Enteropathogenic E coli (EPEC) NOT DETECTED NOT DETECTED Final   Enterotoxigenic E coli (ETEC) NOT DETECTED NOT DETECTED Final   Shiga like toxin producing E coli (STEC) NOT DETECTED NOT DETECTED Final   Shigella/Enteroinvasive E coli (EIEC) NOT DETECTED NOT DETECTED Final   Cryptosporidium NOT DETECTED NOT DETECTED Final   Cyclospora cayetanensis NOT DETECTED NOT DETECTED Final   Entamoeba histolytica NOT DETECTED NOT DETECTED Final   Giardia lamblia NOT DETECTED NOT DETECTED Final   Adenovirus F40/41 NOT DETECTED NOT DETECTED Final   Astrovirus NOT DETECTED NOT DETECTED Final   Norovirus GI/GII NOT DETECTED NOT DETECTED Final   Rotavirus A NOT DETECTED NOT DETECTED Final   Sapovirus (I, II, IV, and V) NOT DETECTED NOT DETECTED Final    Comment: Performed at [facility], [address] [place], [place] [address]    Labs: CBC: Recent Labs  Lab 05/28/22 1426 05/29/22 0414 05/29/22 2140 05/30/22 0942 05/31/22 0520 06/01/22 1401  WBC 30.2* 26.0*  --  32.6* 18.0* 10.1  NEUTROABS 26.1*  --   --  28.4* 13.5* 6.8  HGB 7.8* 6.0* 8.6* 8.3* 7.9* 8.2*  HCT 23.8* 18.1* 24.9* 25.1* 23.7* 24.9*  MCV 90.8 91.4  --  90.3 90.5 91.2  PLT 773* 606*  --  661* 652* [id number]*   Basic Metabolic Panel: Recent Labs  Lab 05/28/22 1426 05/28/22 1713 05/29/22 0414 05/30/22 0942 05/31/22 0520 06/01/22 1401  NA 128*  --  130* 133* 138 137  K 3.4*  --  3.4* 3.8 3.6 4.0  CL 99  --  107 110 109 115*  CO2 17*  --  16* 17* 16* 17*  GLUCOSE 123*  --  108* 101* 83 118*  BUN 29*  --  28* 24* 22* 19  CREATININE 2.50*  --  2.59* 2.58* 2.43* 2.39*   CALCIUM 9.3  --  8.1* 8.1* 8.3*  8.1*  MG  --  1.6* 1.9 1.7  --   --   PHOS  --  3.6  --   --   --   --    Liver Function Tests: Recent Labs  Lab 05/28/22 1426  AST 14*  ALT 12  ALKPHOS 147*  BILITOT 0.5  PROT 6.3*  ALBUMIN 2.3*   CBG: No results for input(s): "GLUCAP" in the last 168 hours.  Discharge time spent: 39 minutes.   Signed: Hosie Poisson, MD [facility] 06/03/2022

## 2022-06-17 ENCOUNTER — Emergency Department
Admission: EM | Admit: 2022-06-17 | Discharge: 2022-06-17 | Disposition: A | Discharge status: Home / Self Care | Payer: Medicaid Other | Attending: Emergency Medicine | Admitting: Emergency Medicine

## 2022-06-17 ENCOUNTER — Emergency Department: Payer: Medicaid Other

## 2022-06-17 ENCOUNTER — Other Ambulatory Visit: Payer: Self-pay

## 2022-06-17 DIAGNOSIS — R600 Localized edema: Secondary | ICD-10-CM | POA: Diagnosis present

## 2022-06-17 LAB — COMPREHENSIVE METABOLIC PANEL
ALT: 15 U/L (ref 0–44)
AST: 16 U/L (ref 15–41)
Albumin: 2.1 g/dL — ABNORMAL LOW (ref 3.5–5.0)
Alkaline Phosphatase: 203 U/L — ABNORMAL HIGH (ref 38–126)
Anion gap: 7 (ref 5–15)
BUN: 23 mg/dL — ABNORMAL HIGH (ref 6–20)
CO2: 23 mmol/L (ref 22–32)
Calcium: 8.5 mg/dL — ABNORMAL LOW (ref 8.9–10.3)
Chloride: 111 mmol/L (ref 98–111)
Creatinine, Ser: 2.58 mg/dL — ABNORMAL HIGH (ref 0.44–1.00)
GFR, Estimated: 21 mL/min — ABNORMAL LOW (ref >60)
Glucose, Bld: 110 mg/dL — ABNORMAL HIGH (ref 70–99)
Potassium: 5.4 mmol/L — ABNORMAL HIGH (ref 3.5–5.1)
Sodium: 141 mmol/L (ref 135–145)
Total Bilirubin: 0.5 mg/dL (ref 0.3–1.2)
Total Protein: 5.8 g/dL — ABNORMAL LOW (ref 6.5–8.1)

## 2022-06-17 LAB — CBC
HCT: 23.5 % — ABNORMAL LOW (ref 36.0–46.0)
Hemoglobin: 7.5 g/dL — ABNORMAL LOW (ref 12.0–15.0)
MCH: 31 pg (ref 26.0–34.0)
MCHC: 31.9 g/dL (ref 30.0–36.0)
MCV: 97.1 fL (ref 80.0–100.0)
Platelets: 544 10*3/uL — ABNORMAL HIGH (ref 150–400)
RBC: 2.42 MIL/uL — ABNORMAL LOW (ref 3.87–5.11)
RDW: 17.4 % — ABNORMAL HIGH (ref 11.5–15.5)
WBC: 9.1 10*3/uL (ref 4.0–10.5)
nRBC: 0 % (ref 0.0–0.2)

## 2022-06-17 LAB — BRAIN NATRIURETIC PEPTIDE: B Natriuretic Peptide: 352.9 pg/mL — ABNORMAL HIGH (ref 0.0–100.0)

## 2022-06-17 MED ORDER — FUROSEMIDE 20 MG PO TABS: 20.0000 mg | ORAL_TABLET | Freq: Every day | ORAL | 0 refills | Status: DC

## 2022-06-17 NOTE — ED Triage Notes (Signed)
Pt to ED via POV from home. Pt rpeorts increased right leg swelling x2 wks. Pt recently seen and admitted for sepsis and referred to nephrologist but appointment is not until May 1st.

## 2022-06-17 NOTE — ED Provider Notes (Signed)
[facility] Provider Note  Patient Contact: 9:36 PM (approximate)   History   Leg Swelling   HPI  Darlene Barber is a 57 y.o. female who presents emergency department for intermittent swelling of the right lower extremity.  Patient has had a complex medical history of the last 1 months.  She was admitted for severe sepsis, ultimately leading to respiratory depression, respiratory failure ending up on ventilator and pressors.  Patient had a complicated recovery process and she had been hospitalized for almost 2 months.  Patient has been doing well, has no fevers, chills, respiratory complaints.  No GI complaints.  She is here she had some intermittent edema of her legs.  It has been both but worse on the right than left.  She states that it does appear to be positional dependent she spends long periods of time with her feet down her legs swell more than normal.  When she puts them up however when she goes to bed the swelling improves.  She has had more issues within the right than the left.  There is no skin changes.  Patient denies any chest pain, shortness of breath.  No history of bleeding or clotting disorders.  No history of DVT.     Physical Exam   Triage Vital Signs: ED Triage Vitals  Enc Vitals Group     BP 06/17/22 1604 (!) 147/101     Pulse Rate 06/17/22 1604 90     Resp 06/17/22 1604 18     Temp 06/17/22 1604 97.9 F (36.6 C)     Temp Source 06/17/22 1604 Oral     SpO2 --      Weight --      Height --      Head Circumference --      Peak Flow --      Pain Score 06/17/22 1605 5     Pain Loc --      Pain Edu? --      Excl. in GC? --     Most recent vital signs: Vitals:   06/17/22 1604  BP: (!) 147/101  Pulse: 90  Resp: 18  Temp: 97.9 F (36.6 C)     General: Alert and in no acute distress.  Cardiovascular:  Good peripheral perfusion Respiratory: Normal respiratory effort without tachypnea or retractions. Lungs CTAB.   Musculoskeletal: Full range of motion to all extremities.  Visualization of the bilateral lower extremities reveals mild edema to the left extremity, more edema to the right.  There is no erythema.  No warmth to palpation.  There is no tenderness to palpation at this time.  Pulses sensation intact and equal bilateral lower extremities. Neurologic:  No gross focal neurologic deficits are appreciated.  Skin:   No rash noted Other:   ED Results / Procedures / Treatments   Labs (all labs ordered are listed, but only abnormal results are displayed) Labs Reviewed  CBC - Abnormal; Notable for the following components:      Result Value   RBC 2.42 (*)    Hemoglobin 7.5 (*)    HCT 23.5 (*)    RDW 17.4 (*)    Platelets 544 (*)    All other components within normal limits  COMPREHENSIVE METABOLIC PANEL - Abnormal; Notable for the following components:   Potassium 5.4 (*)    Glucose, Bld 110 (*)    BUN 23 (*)    Creatinine, Ser 2.58 (*)    Calcium 8.5 (*)  Total Protein 5.8 (*)    Albumin 2.1 (*)    Alkaline Phosphatase 203 (*)    GFR, Estimated 21 (*)    All other components within normal limits  BRAIN NATRIURETIC PEPTIDE - Abnormal; Notable for the following components:   B Natriuretic Peptide 352.9 (*)    All other components within normal limits     EKG     RADIOLOGY  I personally viewed, evaluated, and interpreted these images as part of my medical decision making, as well as reviewing the written report by the radiologist.  ED Provider Interpretation: No evidence of DVT on ultrasound.  US Venous Img Lower Right (DVT Study)  Result Date: 06/17/2022 CLINICAL DATA:  Right lower extremity swelling for 1 week EXAM: RIGHT LOWER EXTREMITY VENOUS DOPPLER ULTRASOUND TECHNIQUE: Gray-scale sonography with compression, as well as color and duplex ultrasound, were performed to evaluate the deep venous system(s) from the level of the common femoral vein through the popliteal and  proximal calf veins. COMPARISON:  None Available. FINDINGS: VENOUS Normal compressibility of the common femoral, superficial femoral, and popliteal veins, as well as the visualized calf veins. Visualized portions of profunda femoral vein and great saphenous vein unremarkable. No filling defects to suggest DVT on grayscale or color Doppler imaging. Doppler waveforms show normal direction of venous flow, normal respiratory plasticity and response to augmentation. Limited views of the contralateral common femoral vein are unremarkable. OTHER Soft tissue edema visualized in the calf. Limitations: none IMPRESSION: No evidence of deep vein thrombosis in the right lower extremity. Electronically Signed   By: Jacob Moores M.D.   On: 06/17/2022 18:05    PROCEDURES:  Critical Care performed: No  Procedures   MEDICATIONS ORDERED IN ED: Medications - No data to display   IMPRESSION / MDM / ASSESSMENT AND PLAN / ED COURSE  I reviewed the triage vital signs and the nursing notes.                                 Differential diagnosis includes, but is not limited to, DVT, cellulitis, CHF  [name] presentation is most consistent with acute presentation with potential threat to life or bodily function.   [name] diagnosis is consistent with peripheral edema.  Patient presents emergency department with edema bilaterally but worse on the right than left.  Patient has had no skin changes, no fevers or chills.  No other complaints at this time.  On exam patient did have edema bilateral lower extremities worse on the right than left.  This was nonpitting in nature.  Pulses sensation intact bilaterally.  Patient had labs, ultrasound.  Labs are roughly at patient's baseline.  She does have some chronic kidney disease but no other concerning findings at this time.  Ultrasound was ordered with no evidence of DVT.  Given patient's history with this being largely positionally dependent I suspect that there is  some increased fluid retention though I do not suspect that this is CHF at this time.  I recommended more conservative measures with compression stocking and ambulation.  However I have provided 5 days of Lasix if conservative measures are not improving her symptoms.  She does have chronic kidney disease, kidney function is roughly at baseline.  As such I prefer to do conservative measures before attempting diuretics.  Patient is agreeable with this plan.  Concerning signs and symptoms and return precautions discussed with the patient.  Follow-up primary care as  needed..  Patient is given ED precautions to return to the ED for any worsening or new symptoms.     FINAL CLINICAL IMPRESSION(S) / ED DIAGNOSES   Final diagnoses:  Peripheral edema     Rx / DC Orders   ED Discharge Orders          Ordered    furosemide (LASIX) 20 MG tablet  Daily        06/17/22 2136             Note:  This document was prepared using Dragon voice recognition software and may include unintentional dictation errors.   Lanette HampshireCuthriell, Yuri Flener D, PA-C 06/17/22 2149    Corena HerterMumma, Shannon, MD 06/18/22 [phone]

## 2022-06-19 ENCOUNTER — Encounter: Payer: Self-pay | Admitting: Gastroenterology

## 2022-06-19 ENCOUNTER — Ambulatory Visit (INDEPENDENT_AMBULATORY_CARE_PROVIDER_SITE_OTHER): Payer: Medicaid Other | Admitting: Gastroenterology

## 2022-06-19 VITALS — BP 118/76 | HR 97 | Ht <= 58 in | Wt 146.0 lb

## 2022-06-19 DIAGNOSIS — K922 Gastrointestinal hemorrhage, unspecified: Secondary | ICD-10-CM | POA: Diagnosis not present

## 2022-06-19 DIAGNOSIS — Z8619 Personal history of other infectious and parasitic diseases: Secondary | ICD-10-CM

## 2022-06-19 DIAGNOSIS — R1310 Dysphagia, unspecified: Secondary | ICD-10-CM | POA: Diagnosis not present

## 2022-06-19 NOTE — Patient Instructions (Addendum)
You have been scheduled for a Barium Esophogram at [facility] (1st floor of the hospital) on Friday, 06-21-22 at 10:00 am. Please arrive 15 minutes prior to your appointment for registration.  If you need to reschedule for any reason, please contact radiology at [phone] to do so. __________________________________________________________________ A barium swallow is an examination that concentrates on views of the esophagus. This tends to be a double contrast exam (barium and two liquids which, when combined, create a gas to distend the wall of the oesophagus) or single contrast (non-ionic iodine based). The study is usually tailored to your symptoms so a good history is essential. Attention is paid during the study to the form, structure and configuration of the esophagus, looking for functional disorders (such as aspiration, dysphagia, achalasia, motility and reflux) EXAMINATION You may be asked to change into a gown, depending on the type of swallow being performed. A radiologist and radiographer will perform the procedure. The radiologist will advise you of the type of contrast selected for your procedure and direct you during the exam. You will be asked to stand, sit or lie in several different positions and to hold a small amount of fluid in your mouth before being asked to swallow while the imaging is performed .In some instances you may be asked to swallow barium coated marshmallows to assess the motility of a solid food bolus. The exam can be recorded as a digital or video fluoroscopy procedure. POST PROCEDURE It will take 1-2 days for the barium to pass through your system. To facilitate this, it is important, unless otherwise directed, to increase your fluids for the next 24-48hrs and to resume your normal diet.  This test typically takes about 30 minutes to perform. __________________________________________________________________________________   Please purchase the following  medications over the counter and take as directed:  Daily fiber supplement such as Citrucel or Benefiber: Take daily  If you experience a reoccurrence of diarrhea, please notify Darlene Barber right away.  You have been scheduled for a follow up appointment on Tuesday, 09-24-22 at 10:10am. Please arrive 10 minutes early for registration.  If you need to reschedule please call [phone]. Thank you  Thank you for entrusting me with your care and for choosing [facility], Dr. Ileene Patrick   If your blood pressure at your visit was 140/90 or greater, please contact your primary care physician to follow up on this.  _______________________________________________________  If you are age 48 or older, your body mass index should be between 23-30. Your Body mass index is 30.51 kg/m. If this is out of the aforementioned range listed, please consider follow up with your Primary Care Provider.  If you are age 76 or younger, your body mass index should be between 19-25. Your Body mass index is 30.51 kg/m. If this is out of the aformentioned range listed, please consider follow up with your Primary Care Provider.   ________________________________________________________  The [facility] providers would like to encourage you to use [facility] to communicate with providers for non-urgent requests or questions.  Due to long hold times on the telephone, sending your provider a message by [facility] may be a faster and more efficient way to get a response.  Please allow 48 business hours for a response.  Please remember that this is for non-urgent requests.  _______________________________________________________  Due to recent changes in healthcare laws, you may see the results of your imaging and laboratory studies on MyChart before your provider has had a chance to review them.  We  understand that in some cases there may be results that are confusing or concerning to you. Not all laboratory results come back  in the same time frame and the provider may be waiting for multiple results in order to interpret others.  Please give Darlene Barber 48 hours in order for your provider to thoroughly review all the results before contacting the office for clarification of your results.

## 2022-06-19 NOTE — Progress Notes (Signed)
HPI :  57 year old female here for a posthospitalization follow-up for colitis, C. difficile, rectal bleeding.  Recall I first met this patient in the hospital when we were consulted on her care.  She was in the hospital from March 10, 2022 through April 23, 2022.  Admitted initially to the ICU with septic shock-respiratory failure requiring pressors/intubation on initial presentation.  She was subsequently was found to have Streptococcus bacteremia.  Hospital course was complicated by AKI requiring CRRT and acute metabolic encephalopathy/ICU delirium.  [facility]  Subsequent hospital course complicated by lower GI bleeding with acute blood loss anemia-requiring transfer back to the ICU.  Ultimately she was found to have large ulcers and colitis in her entire colon.  She required multiple units of PRBC transfusion.  [facility] and eventually recovered with supportive care.  Please see below for numerous imaging studies that were done during her course.  She had significant lower GI bleeding.  Initial CTA was negative.  She had worsening renal function leading to CRRT for some time.  Colonoscopy showed numerous large ulcerations in her colon, question ischemic versus infectious.  Biopsies were nondiagnostic.  She required multiple units of blood but eventually stabilized from this and recovered.  Unfortunately she was readmitted 05/28/22 to 06/01/22 when she presented with SIRS secondary to C. difficile infection.  She was treated with vancomycin and eventually improved.  She was just seen yesterday in the ED due to foot and leg swelling however ultrasound was negative for DVT.  She states in general she has had a long recovery from her hospitalizations but does seem to be improving.  She is ambulating with a walker.  She states her stools have definitely formed and she no longer has diarrhea but does occasionally have  some leakage with gas if stools are soft.  She denies any diarrhea or rectal bleeding.  It does not appear she is taking anything for her bowels, she is not taking Imodium as listed in her med list.  She thinks she had a colonoscopy many years ago but nothing recent in advance of her hospitalization.  She denies any family history of colon cancer or IBD.  Her renal function has stabilized but remains impaired with CKD.  As part of her evaluation she had an upper endoscopy to rule out brisk upper GI bleed.  This was negative and her esophagus appeared normal.  She states since her hospitalization she has developed some dysphagia with meats and pills.  Things are getting stuck more frequently and she inquires about what could be causing that.  Otherwise she appears to have recovered from her infection.  She denies any cardiopulmonary symptoms and is feeling better.  Prior workup during hospitalization: 12/30>> CT chest/abdomen/pelvis: Fluid throughout stomach/small bowel/colon-nonspecific 01/05>> CT chest/abdomen/pelvis: Persistent areas of wall thickening along the colon, patchy bilateral ill-defined lung opacities 01/19>> CT abdomen/pelvis: Diarrheal illness with fluid reaching the rectum, colonic wall thickening is improved.  Improving bilateral PNA.  Bilateral nephrolithiasis without obstruction. 01/19>> CT angio GI bleed: Limited evaluation because of oral contrast within the distal small bowel/throughout colon-active GI bleeding could not be assessed. 01/21>> CT angio GI bleed: No active GI bleeding, multiple infectious/inflammatory colitis involving splenic flexure and ascending colon. 02/9 >> CT abdomen pelvis without contras with no acute findings, with interval improvement of previously demonstrated diffuse colonic wall thickening and surrounding inflammatory changes .    EGD 03/30/22:  normal  Colonoscopy 03/30/22: - Preparation of the colon was fair to poor in some areas due to clot burden. -  Multiple ulcers throughout the colon, worst in ascending colon - could not clear entire colon due to clot or localize source of bleeding, suspect she has ulcer related bleeding, perhaps right colon, given burden of clot / blood. EGD was performed after this exam to ensure no upper tract source given location of blood noted throughout and was normal. Unclear if this represents ischemic change vs. viral vs. other - small biopsies taken to assess for this. She is critically ill, no active bleeding following extensive lavage during this exam however as above visualization poor in several areas.  FINAL MICROSCOPIC DIAGNOSIS:   A. COLON, BIOSPY:  -  Scant fragment of granulation tissue consistent ulceration with acute  and chronic inflammation (no mucosal epithelium is present on the biopsy  specimen).  - An immunohistochemical stain for CMV is negative.  -  EBV ISH is negative.  -  An immunohistochemical stain for CK AE1/AE3 is negative (no  epithelium present in the biopsy specimen), negative for malignancy.   Note: There is overall limited tissue for evaluation.  An underlying  etiology for the ulceration/granulation tissue is not identified on the  scant tissue.  The case was discussed with nurse practitioner Merilyn Baba.    Past Medical History:  Diagnosis Date   Allergy    Anxiety    Bursitis of both hips    Depression    Frequent headaches    Obesity    Sleep apnea    doesn't use CPAP machine broken,      Past Surgical History:  Procedure Laterality Date   BIOPSY  03/30/2022   Procedure: BIOPSY;  Surgeon: Benancio Deeds, MD;  Location: [facility] ENDOSCOPY;  Service: Gastroenterology;;   CESAREAN SECTION     x2   CHOLECYSTECTOMY     COLONOSCOPY WITH PROPOFOL N/A 11/20/2017   Procedure: COLONOSCOPY WITH PROPOFOL;  Surgeon: Toney Reil, MD;  Location[facility] ENDOSCOPY;  Service: Gastroenterology;  Laterality: N/A;   COLONOSCOPY WITH PROPOFOL N/A 03/30/2022   Procedure:  COLONOSCOPY WITH PROPOFOL;  Surgeon: Benancio Deeds, MD;  Location: [facility] ENDOSCOPY;  Service: Gastroenterology;  Laterality: N/A;   ESOPHAGOGASTRODUODENOSCOPY (EGD) WITH PROPOFOL N/A 03/30/2022   Procedure: ESOPHAGOGASTRODUODENOSCOPY (EGD) WITH PROPOFOL;  Surgeon: Benancio Deeds, MD;  Location: [facility] ENDOSCOPY;  Service: Gastroenterology;  Laterality: N/A;   HYSTERECTOMY ABDOMINAL WITH SALPINGECTOMY  1998   HYSTEROSCOPY     Family History  Problem Relation Age of Onset   Hodgkin's lymphoma Mother    Heart failure Maternal Grandmother    Alzheimer's disease Maternal Grandfather    Emphysema Paternal Grandmother    Diabetes Maternal Aunt    Diabetes Maternal Uncle    Cancer Other    Breast cancer Neg Hx    Social History   Tobacco Use   Smoking status: Every Day    Packs/day: 1.00    Years: 30.00    Additional pack years: 0.00    Total pack years: 30.00    Types: Cigarettes   Smokeless tobacco: Current  Vaping Use   Vaping Use: Never used  Substance Use Topics   Alcohol use: Yes    Comment: occ once per year   Drug use: Yes    Types: Marijuana    Comment: Current use   Current Outpatient Medications  Medication Sig Dispense Refill   acyclovir (ZOVIRAX) 200 MG capsule Take 2  capsules (400 mg total) by mouth 2 (two) times daily.     albuterol (VENTOLIN HFA) 108 (90 Base) MCG/ACT inhaler INHALE 2 PUFFS EVERY FOUR HOURS AS NEEDED 18 g 0   EMGALITY 120 MG/ML SOSY Inject into the skin.     famotidine (PEPCID) 10 MG tablet Take 1 tablet (10 mg total) by mouth daily. 30 tablet 0   fluticasone (FLONASE) 50 MCG/ACT nasal spray 1 SPARY IN EACH NOSTRIL DAILY 16 g 0   Iron Combinations (CHROMAGEN) capsule Take 1 capsule by mouth daily.     lidocaine (LIDODERM) 5 % Place 1 patch onto the skin daily. Remove & Discard patch within 12 hours or as directed by MD 1 patch 0   multivitamin (RENA-VIT) TABS tablet Take 1 tablet by mouth at bedtime.  0   nortriptyline (PAMELOR) 25 MG  capsule Take 25 mg by mouth See admin instructions. Take 25 mg (1 capsule) by mouth every morning, and 50 mg (2 capsules) every night at bedtime.     ondansetron (ZOFRAN-ODT) 4 MG disintegrating tablet Take by mouth. Take 1 tablet (4 mg total) by mouth every 8 (eight) hours as needed for Nausea for up to 40 days     QUEtiapine (SEROQUEL) 300 MG tablet Take 300 mg by mouth at bedtime.     sodium bicarbonate 650 MG tablet Take 1 tablet (650 mg total) by mouth 2 (two) times daily.     tiotropium (SPIRIVA) 18 MCG inhalation capsule Place 1 capsule (18 mcg total) into inhaler and inhale daily. 30 capsule 12   traZODone (DESYREL) 50 MG tablet Take 1 tablet (50 mg total) by mouth at bedtime as needed for sleep. 30 tablet 11   Vilazodone HCl (VIIBRYD) 40 MG TABS Take 40 mg by mouth daily.     fexofenadine (ALLEGRA) 180 MG tablet Take 180 mg by mouth daily. (Patient not taking: Reported on 06/19/2022)     furosemide (LASIX) 20 MG tablet Take 1 tablet (20 mg total) by mouth daily. (Patient not taking: Reported on 06/19/2022) 5 tablet 0   loperamide (IMODIUM) 2 MG capsule Take 1 capsule (2 mg total) by mouth as needed for diarrhea or loose stools. (Patient not taking: Reported on 06/19/2022) 30 capsule 0   Nutritional Supplements (,FEEDING SUPPLEMENT, PROSOURCE PLUS) liquid Take 30 mLs by mouth 2 (two) times daily between meals. (Patient not taking: Reported on 06/19/2022)     promethazine (PHENERGAN) 25 MG tablet Take 1 tablet (25 mg total) by mouth every 6 (six) hours as needed for nausea or vomiting. (Patient not taking: Reported on 06/19/2022) 90 tablet 0   Current Facility-Administered Medications  Medication Dose Route Frequency Provider Last Rate Last Admin   lidocaine HCl (PF) (XYLOCAINE) 2 % injection 50 mL  50 mL Other Once Alfredo Martinez, MD       Allergies  Allergen Reactions   Piper Other (See Comments)    Black pepper Feels like throat is closing, itchy Feels like throat is closing,  itchy Feels like throat is closing, itchy   Tape Rash    Paper tape is ok to use.     Review of Systems: All systems reviewed and negative except where noted in HPI.   Lab Results  Component Value Date   WBC 9.1 06/17/2022   HGB 7.5 (L) 06/17/2022   HCT 23.5 (L) 06/17/2022   MCV 97.1 06/17/2022   PLT 544 (H) 06/17/2022    Lab Results  Component Value Date   CREATININE 2.58 (H)  06/17/2022   BUN 23 (H) 06/17/2022   NA 141 06/17/2022   K 5.4 (H) 06/17/2022   CL 111 06/17/2022   CO2 23 06/17/2022     Physical Exam: BP 118/76   Pulse 97   Ht 4\' 10"  (1.473 m)   Wt 146 lb (66.2 kg)   BMI 30.51 kg/m  Constitutional: Pleasant,well-developed, female in no acute distress. Skin: Skin is warm and dry. No rashes noted. Psychiatric: Normal mood and affect. Behavior is normal.   ASSESSMENT: 57 y.o. female here for assessment of the following  1. Lower GI bleed   2. History of Clostridioides difficile colitis   3. Dysphagia, unspecified type    As above, the patient was quite ill in the hospital with bacteremia sepsis, AKI in need of dialysis.  She developed a significant lower GI bleed secondary to multiple colonic ulcers, nothing focal to treat.  This may have been either ischemic versus infectious although tested negative for infection at the time.  I think IBD would be less likely.  Unfortunately was readmitted with C. difficile following her initial hospitalization and responded appropriately to therapy.  At this time she is not having any further bleeding, she is having more formed stools although occasionally on the looser side.  She is improving with supportive care, again given her course to date I think IBD much less likely.  If she continues to recover well and can handle it we may consider colonoscopy over the next 3 to 6 months.  I like to see her back in the office in 3 to 4 months for reassessment.  If she has any recurrent diarrhea in the interim she should contact  me.  In the interim I like to start her on Benefiber or Citrucel to help bulk her stools, she should take that once daily.  Otherwise she has had some intermittent dysphagia as outlined since her hospitalization.  Her EGD showed no obvious stenosis, question underlying dysmotility.  Offered her a barium study with tablet to see if we can help localize where this is occurring and what is causing it initially.  She is agreeable to this.  If it progressively gets worse in the interim she needs to contact me.   PLAN: - barium swallow with tablet - see if dysmotility vs. subtle stricture, prior EGD looked okay - take daily fiber supplement to form stools - Benefiber or Citrucel preferred - call if any recurrent diarrhea - follow up in the office in 3-4 months - consider colonoscopy at that time if she has recovered and can handle it  Harlin Rain, MD [facility]

## 2022-06-21 ENCOUNTER — Ambulatory Visit
Admission: RE | Admit: 2022-06-21 | Discharge: 2022-06-21 | Disposition: A | Discharge status: Home / Self Care | Payer: Medicaid Other | Source: Ambulatory Visit | Attending: Gastroenterology | Admitting: Gastroenterology

## 2022-06-21 DIAGNOSIS — R1310 Dysphagia, unspecified: Secondary | ICD-10-CM | POA: Diagnosis present

## 2022-07-02 ENCOUNTER — Other Ambulatory Visit: Payer: Self-pay | Admitting: Physician Assistant

## 2022-07-02 DIAGNOSIS — R531 Weakness: Secondary | ICD-10-CM

## 2022-07-04 ENCOUNTER — Other Ambulatory Visit: Payer: Self-pay | Admitting: Internal Medicine

## 2022-07-04 DIAGNOSIS — Z1231 Encounter for screening mammogram for malignant neoplasm of breast: Secondary | ICD-10-CM

## 2022-07-10 DIAGNOSIS — R600 Localized edema: Secondary | ICD-10-CM | POA: Insufficient documentation

## 2022-07-10 DIAGNOSIS — R8290 Unspecified abnormal findings in urine: Secondary | ICD-10-CM | POA: Insufficient documentation

## 2022-07-10 DIAGNOSIS — R809 Proteinuria, unspecified: Secondary | ICD-10-CM | POA: Insufficient documentation

## 2022-07-12 ENCOUNTER — Ambulatory Visit
Admission: RE | Admit: 2022-07-12 | Discharge: 2022-07-12 | Disposition: A | Discharge status: Home / Self Care | Payer: Medicaid Other | Source: Ambulatory Visit | Attending: Physician Assistant | Admitting: Physician Assistant

## 2022-07-12 DIAGNOSIS — R531 Weakness: Secondary | ICD-10-CM

## 2022-07-15 ENCOUNTER — Emergency Department (HOSPITAL_COMMUNITY)
Admission: EM | Admit: 2022-07-15 | Discharge: 2022-07-17 | Disposition: A | Discharge status: Home / Self Care | Payer: No Typology Code available for payment source | Attending: Emergency Medicine | Admitting: Emergency Medicine

## 2022-07-15 ENCOUNTER — Other Ambulatory Visit: Payer: Self-pay

## 2022-07-15 ENCOUNTER — Encounter (HOSPITAL_COMMUNITY): Payer: Self-pay

## 2022-07-15 DIAGNOSIS — R4589 Other symptoms and signs involving emotional state: Secondary | ICD-10-CM

## 2022-07-15 DIAGNOSIS — F17210 Nicotine dependence, cigarettes, uncomplicated: Secondary | ICD-10-CM | POA: Insufficient documentation

## 2022-07-15 DIAGNOSIS — N189 Chronic kidney disease, unspecified: Secondary | ICD-10-CM | POA: Diagnosis not present

## 2022-07-15 DIAGNOSIS — F333 Major depressive disorder, recurrent, severe with psychotic symptoms: Secondary | ICD-10-CM | POA: Diagnosis present

## 2022-07-15 DIAGNOSIS — N184 Chronic kidney disease, stage 4 (severe): Secondary | ICD-10-CM

## 2022-07-15 DIAGNOSIS — R45851 Suicidal ideations: Secondary | ICD-10-CM | POA: Diagnosis not present

## 2022-07-15 LAB — BASIC METABOLIC PANEL
Anion gap: 7 (ref 5–15)
BUN: 32 mg/dL — ABNORMAL HIGH (ref 6–20)
CO2: 24 mmol/L (ref 22–32)
Calcium: 9.1 mg/dL (ref 8.9–10.3)
Chloride: 108 mmol/L (ref 98–111)
Creatinine, Ser: 3.03 mg/dL — ABNORMAL HIGH (ref 0.44–1.00)
GFR, Estimated: 17 mL/min — ABNORMAL LOW (ref >60)
Glucose, Bld: 80 mg/dL (ref 70–99)
Potassium: 4.9 mmol/L (ref 3.5–5.1)
Sodium: 139 mmol/L (ref 135–145)

## 2022-07-15 LAB — CBC WITH DIFFERENTIAL/PLATELET
Abs Immature Granulocytes: 0.03 10*3/uL (ref 0.00–0.07)
Basophils Absolute: 0.1 10*3/uL (ref 0.0–0.1)
Basophils Relative: 1 %
Eosinophils Absolute: 0.6 10*3/uL — ABNORMAL HIGH (ref 0.0–0.5)
Eosinophils Relative: 6 %
HCT: 24.5 % — ABNORMAL LOW (ref 36.0–46.0)
Hemoglobin: 7.7 g/dL — ABNORMAL LOW (ref 12.0–15.0)
Immature Granulocytes: 0 %
Lymphocytes Relative: 24 %
Lymphs Abs: 2.2 10*3/uL (ref 0.7–4.0)
MCH: 31.7 pg (ref 26.0–34.0)
MCHC: 31.4 g/dL (ref 30.0–36.0)
MCV: 100.8 fL — ABNORMAL HIGH (ref 80.0–100.0)
Monocytes Absolute: 0.6 10*3/uL (ref 0.1–1.0)
Monocytes Relative: 6 %
Neutro Abs: 6 10*3/uL (ref 1.7–7.7)
Neutrophils Relative %: 63 %
Platelets: 522 10*3/uL — ABNORMAL HIGH (ref 150–400)
RBC: 2.43 MIL/uL — ABNORMAL LOW (ref 3.87–5.11)
RDW: 16.2 % — ABNORMAL HIGH (ref 11.5–15.5)
WBC: 9.4 10*3/uL (ref 4.0–10.5)
nRBC: 0 % (ref 0.0–0.2)

## 2022-07-15 MED ORDER — TRAZODONE HCL 50 MG PO TABS
50.0000 mg | ORAL_TABLET | Freq: Every evening | ORAL | Status: DC | PRN
  Administered 2022-07-15 – 2022-07-17 (×3): 50 mg via ORAL
  Filled 2022-07-15 (×3): qty 1

## 2022-07-15 MED ORDER — ACYCLOVIR 400 MG PO TABS
400.0000 mg | ORAL_TABLET | Freq: Two times a day (BID) | ORAL | Status: DC
  Administered 2022-07-15 – 2022-07-17 (×5): 400 mg via ORAL
  Filled 2022-07-15 (×5): qty 1

## 2022-07-15 MED ORDER — ALBUTEROL SULFATE HFA 108 (90 BASE) MCG/ACT IN AERS
1.0000 | INHALATION_SPRAY | Freq: Once | RESPIRATORY_TRACT | Status: AC
  Administered 2022-07-15: 1 via RESPIRATORY_TRACT
  Filled 2022-07-15: qty 6.7

## 2022-07-15 MED ORDER — QUETIAPINE FUMARATE 300 MG PO TABS
300.0000 mg | ORAL_TABLET | Freq: Every day | ORAL | Status: DC
  Administered 2022-07-15 – 2022-07-17 (×3): 300 mg via ORAL
  Filled 2022-07-15 (×3): qty 1

## 2022-07-15 NOTE — Consult Note (Addendum)
BH ED ASSESSMENT   Reason for Consult:  Psychiatry evaluation Referring Physician:  ER Physician Patient Identification: Darlene Barber MRN:  [id number] ED Chief Complaint: Severe episode of recurrent major depressive disorder, with psychotic features (HCC)  Diagnosis:  Principal Problem:   Severe episode of recurrent major depressive disorder, with psychotic features [facility])   ED Assessment Time Calculation: Start Time: 1828 Stop Time: 1850 Total Time in Minutes (Assessment Completion): 22   Subjective:   Darlene Barber is a 58 y.o. female patient admitted with long hx of depression, anxiety and reported Bipolar disorder originally came to the ER for Acute renal failure to be seen by Nephrologist.  However patient is now endorsing suicide ideation stating generalized pain, multiple Medical issues.[name]  HPI:  Patient was seen in the room tearful.  Patient is worried about the kidney failure including other medical issues she has.  Patient reports long hx of Mental illness from age 73.  She report lately she has been feeling suicidal off and on stating life has no meaning for her anymore.  Patient reports being in pain all day long.  She has not been on any Psychotropic in the past two years since she lost a good doctor at [facility] that passed on.  Patient reports that the doctor that took over was not good to her so she did not keep appointments.  However the same Psychiatrist dropped her this January when she was hospitalized for two months in the hospital.  Patient reports she was tried on Rexulti and that she was on Valium 10 mg three times a day for anxiety.  She became angry when the Psychiatrist at [facility] weaned her off the Valium because Medicaid would not pay for it.  Patient has been having passive suicide ideation for two weeks but today stating she is not suicidal in the ER room.  Patient reports poor sleep and appetite.  Patient reports spending her time at home crying and not knowing what to  do and what will happen to her soon. We discussed the need to be admitted to a psychiatry unit foir stabilization.  Patient was once admitted at [facility] about eight years ago after an OD.  She is willing to seek psychiatry care.  We also discussed that seeking bed placement depends on what and where Nephrology plans to to do.  Patient is Medically cleared but is waiting to be seen by Nephrologist.  Home Medications were sent home by patient.  Son plans to bring them back tonight or tomorrow.  We will seek bed placement at any facility with available bed.Of note patient vape Cannabis daily for pain and nausea and forr appetite.   Past Psychiatric History: ong hx of depression, anxiety and reported Bipolar disorder.  One inpatient Psychiatry hospitalization.  One episode of Suicide attempt by OD eight years ago.  No Psychotropic medications in two years.  Currently no Psychiatrist.  Risk to Self or Others: Is the patient at risk to self? Yes Has the patient been a risk to self in the past 6 months? Yes Has the patient been a risk to self within the distant past? Yes Is the patient a risk to others? No Has the patient been a risk to others in the past 6 months? No Has the patient been a risk to others within the distant past? No  [place] Scale:  Flowsheet Row ED from 07/15/2022 in [facility] at [facility] ED from 06/17/2022 in [facility] Emergency  Department at [facility] ED to Hosp-Admission (Discharged) from 05/28/2022 in [facility] (1A)  C-SSRS RISK CATEGORY Low Risk No Risk No Risk       AIMS:  , , ,  ,   ASAM:    Substance Abuse:     Past Medical History:  Past Medical History:  Diagnosis Date   Allergy    Anxiety    Bursitis of both hips    Depression    Frequent headaches    Obesity    Sleep apnea    doesn't use CPAP machine broken,     Past Surgical History:  Procedure Laterality Date   BIOPSY  03/30/2022    Procedure: BIOPSY;  Surgeon: Benancio Deeds, MD;  Location: [facility] ENDOSCOPY;  Service: Gastroenterology;;   CESAREAN SECTION     x2   CHOLECYSTECTOMY     COLONOSCOPY WITH PROPOFOL N/A 11/20/2017   Procedure: COLONOSCOPY WITH PROPOFOL;  Surgeon: Toney Reil, MD;  Location: [facility] ENDOSCOPY;  Service: Gastroenterology;  Laterality: N/A;   COLONOSCOPY WITH PROPOFOL N/A 03/30/2022   Procedure: COLONOSCOPY WITH PROPOFOL;  Surgeon: Benancio Deeds, MD;  Location: [facility] ENDOSCOPY;  Service: Gastroenterology;  Laterality: N/A;   ESOPHAGOGASTRODUODENOSCOPY (EGD) WITH PROPOFOL N/A 03/30/2022   Procedure: ESOPHAGOGASTRODUODENOSCOPY (EGD) WITH PROPOFOL;  Surgeon: Benancio Deeds, MD;  Location: [facility] ENDOSCOPY;  Service: Gastroenterology;  Laterality: N/A;   HYSTERECTOMY ABDOMINAL WITH SALPINGECTOMY  1998   HYSTEROSCOPY     Family History:  Family History  Problem Relation Age of Onset   Hodgkin's lymphoma Mother    Heart failure Maternal Grandmother    Alzheimer's disease Maternal Grandfather    Emphysema Paternal Grandmother    Diabetes Maternal Aunt    Diabetes Maternal Uncle    Cancer Other    Breast cancer Neg Hx    Family Psychiatric  History: Paternal Grandmother- Schizophrenia Social History:  Social History   Substance and Sexual Activity  Alcohol Use Yes   Comment: occ once per year     Social History   Substance and Sexual Activity  Drug Use Yes   Types: Marijuana   Comment: Current use    Social History   Socioeconomic History   Marital status: Divorced    Spouse name: Not on file   Number of children: Not on file   Years of education: High School   Highest education level: Not on file  Occupational History   Occupation: Unemployed  Tobacco Use   Smoking status: Every Day    Packs/day: 1.00    Years: 30.00    Additional pack years: 0.00    Total pack years: 30.00    Types: Cigarettes   Smokeless tobacco: Current  Vaping Use   Vaping Use: Never  used  Substance and Sexual Activity   Alcohol use: Yes    Comment: occ once per year   Drug use: Yes    Types: Marijuana    Comment: Current use   Sexual activity: Not Currently  Other Topics Concern   Not on file  Social History Narrative   Not on file   Social Determinants of Health   Financial Resource Strain: Not on file  Food Insecurity: No Food Insecurity (05/28/2022)   Hunger Vital Sign    Worried About Running Out of Food in the Last Year: Never true    Ran Out of Food in the Last Year: Never true  Transportation Needs: No Transportation Needs (05/28/2022)   PRAPARE - Transportation  Lack of Transportation (Medical): No    Lack of Transportation (Non-Medical): No  Physical Activity: Not on file  Stress: Not on file  Social Connections: Not on file   Additional Social History:    Allergies:   Allergies  Allergen Reactions   Piper Other (See Comments)    Black pepper Feels like throat is closing, itchy  Feels like throat is closing, itchy  Other Reaction(s): Other (see comments)  Feels like throat is closing, itchy Feels like throat is closing, itchy   Silicone Rash   Tape Rash    Paper tape is ok to use.    Labs:  Results for orders placed or performed during the hospital encounter of 07/15/22 (from the past 48 hour(s))  CBC with Differential     Status: Abnormal   Collection Time: 07/15/22  4:20 PM  Result Value Ref Range   WBC 9.4 4.0 - 10.5 K/uL   RBC 2.43 (L) 3.87 - 5.11 MIL/uL   Hemoglobin 7.7 (L) 12.0 - 15.0 g/dL   HCT 16.1 (L) 09.6 - 04.5 %   MCV 100.8 (H) 80.0 - 100.0 fL   MCH 31.7 26.0 - 34.0 pg   MCHC 31.4 30.0 - 36.0 g/dL   RDW 40.9 (H) 81.1 - 91.4 %   Platelets 522 (H) 150 - 400 K/uL   nRBC 0.0 0.0 - 0.2 %   Neutrophils Relative % 63 %   Neutro Abs 6.0 1.7 - 7.7 K/uL   Lymphocytes Relative 24 %   Lymphs Abs 2.2 0.7 - 4.0 K/uL   Monocytes Relative 6 %   Monocytes Absolute 0.6 0.1 - 1.0 K/uL   Eosinophils Relative 6 %   Eosinophils  Absolute 0.6 (H) 0.0 - 0.5 K/uL   Basophils Relative 1 %   Basophils Absolute 0.1 0.0 - 0.1 K/uL   Immature Granulocytes 0 %   Abs Immature Granulocytes 0.03 0.00 - 0.07 K/uL    Comment: Performed at [facility], [address] [place], [place] [address]  Basic metabolic panel     Status: Abnormal   Collection Time: 07/15/22  4:20 PM  Result Value Ref Range   Sodium 139 135 - 145 mmol/L   Potassium 4.9 3.5 - 5.1 mmol/L   Chloride 108 98 - 111 mmol/L   CO2 24 22 - 32 mmol/L   Glucose, Bld 80 70 - 99 mg/dL    Comment: Glucose reference range applies only to samples taken after fasting for at least 8 hours.   BUN 32 (H) 6 - 20 mg/dL   Creatinine, Ser 6.21 (H) 0.44 - 1.00 mg/dL   Calcium 9.1 8.9 - 30.8 mg/dL   GFR, Estimated 17 (L) >60 mL/min    Comment: (NOTE) Calculated using the CKD-EPI Creatinine Equation (2021)    Anion gap 7 5 - 15    Comment: Performed at [facility], [address] [place], [place] [address]    Current Facility-Administered Medications  Medication Dose Route Frequency Provider Last Rate Last Admin   lidocaine HCl (PF) (XYLOCAINE) 2 % injection 50 mL  50 mL Other Once Alfredo Martinez, MD       Current Outpatient Medications  Medication Sig Dispense Refill   acyclovir (ZOVIRAX) 200 MG capsule Take 2 capsules (400 mg total) by mouth 2 (two) times daily.     albuterol (VENTOLIN HFA) 108 (90 Base) MCG/ACT inhaler INHALE 2 PUFFS EVERY FOUR HOURS AS NEEDED 18 g 0   EMGALITY 120 MG/ML SOSY  Inject into the skin.     famotidine (PEPCID) 10 MG tablet Take 1 tablet (10 mg total) by mouth daily. 30 tablet 0   fexofenadine (ALLEGRA) 180 MG tablet Take 180 mg by mouth daily. (Patient not taking: Reported on 06/19/2022)     fluticasone (FLONASE) 50 MCG/ACT nasal spray 1 SPARY IN EACH NOSTRIL DAILY 16 g 0   furosemide (LASIX) 20 MG tablet Take 1 tablet (20 mg total) by mouth daily. (Patient not taking: Reported on 06/19/2022) 5  tablet 0   Iron Combinations (CHROMAGEN) capsule Take 1 capsule by mouth daily.     lidocaine (LIDODERM) 5 % Place 1 patch onto the skin daily. Remove & Discard patch within 12 hours or as directed by MD 1 patch 0   loperamide (IMODIUM) 2 MG capsule Take 1 capsule (2 mg total) by mouth as needed for diarrhea or loose stools. (Patient not taking: Reported on 06/19/2022) 30 capsule 0   multivitamin (RENA-VIT) TABS tablet Take 1 tablet by mouth at bedtime.  0   nortriptyline (PAMELOR) 25 MG capsule Take 25 mg by mouth See admin instructions. Take 25 mg (1 capsule) by mouth every morning, and 50 mg (2 capsules) every night at bedtime.     Nutritional Supplements (,FEEDING SUPPLEMENT, PROSOURCE PLUS) liquid Take 30 mLs by mouth 2 (two) times daily between meals. (Patient not taking: Reported on 06/19/2022)     promethazine (PHENERGAN) 25 MG tablet Take 1 tablet (25 mg total) by mouth every 6 (six) hours as needed for nausea or vomiting. (Patient not taking: Reported on 06/19/2022) 90 tablet 0   QUEtiapine (SEROQUEL) 300 MG tablet Take 300 mg by mouth at bedtime.     sodium bicarbonate 650 MG tablet Take 1 tablet (650 mg total) by mouth 2 (two) times daily.     tiotropium (SPIRIVA) 18 MCG inhalation capsule Place 1 capsule (18 mcg total) into inhaler and inhale daily. 30 capsule 12   traZODone (DESYREL) 50 MG tablet Take 1 tablet (50 mg total) by mouth at bedtime as needed for sleep. 30 tablet 11   Vilazodone HCl (VIIBRYD) 40 MG TABS Take 40 mg by mouth daily.      Musculoskeletal: Strength & Muscle Tone:  sitting in bed Gait & Station:  seen sitting in bed  Patient leans:  see above   Psychiatric Specialty Exam: Presentation  General Appearance:  Casual; Neat  Eye Contact: Good  Speech: Clear and Coherent; Normal Rate  Speech Volume: Normal  Handedness: Right   Mood and Affect  Mood: Depressed; Anxious; Angry  Affect: Depressed; Tearful; Congruent   Thought Process  Thought  Processes: Coherent; Goal Directed; Linear  Descriptions of Associations:Intact  Orientation:Full (Time, Place and Person)  Thought Content:Logical  History of Schizophrenia/Schizoaffective disorder:No data recorded Duration of Psychotic Symptoms:No data recorded Hallucinations:Hallucinations: Visual Description of Visual Hallucinations: sees things she cannot erxplain but not shadows.  Ideas of Reference:None  Suicidal Thoughts:Suicidal Thoughts: Yes, Passive SI Passive Intent and/or Plan: Without Plan  Homicidal Thoughts:Homicidal Thoughts: No   Sensorium  Memory: Immediate Good; Recent Good; Remote Fair  Judgment: Intact  Insight: Present   Executive Functions  Concentration: Good  Attention Span: Good  Recall: Fair  Fund of Knowledge: Good  Language: Good   Psychomotor Activity  Psychomotor Activity: Psychomotor Activity: Normal   Assets  Assets: Communication Skills; Desire for Improvement; Housing; Social Support    Sleep  Sleep: Sleep: Poor   Physical Exam: Physical Exam Vitals and nursing note reviewed.  Constitutional:      Appearance: She is ill-appearing.  HENT:     Head: Normocephalic and atraumatic.     Nose: Nose normal.  Cardiovascular:     Rate and Rhythm: Normal rate.  Pulmonary:     Effort: Pulmonary effort is normal.  Musculoskeletal:     Cervical back: Normal range of motion.     Comments: C/o Generalized body ache-chronic.  Skin:    General: Skin is warm and dry.     Coloration: Skin is pale.  Neurological:     General: No focal deficit present.     Mental Status: She is alert and oriented to person, place, and time.  Psychiatric:        Attention and Perception: Attention normal. She perceives visual hallucinations.        Mood and Affect: Mood is anxious and depressed. Affect is angry and tearful.        Speech: Speech normal.        Behavior: Behavior is cooperative.        Thought Content: Thought  content includes suicidal ideation.        Cognition and Memory: Cognition and memory normal.        Judgment: Judgment normal.    Review of Systems  Constitutional:  Positive for weight loss.  HENT: Negative.    Eyes: Negative.   Respiratory: Negative.    Cardiovascular: Negative.   Genitourinary:        Reports acute kidney failure  Musculoskeletal:  Positive for back pain, joint pain and myalgias.  Skin: Negative.   Psychiatric/Behavioral:  Positive for suicidal ideas. The patient is nervous/anxious and has insomnia.    Blood pressure (!) 133/96, pulse 99, temperature 98.2 F (36.8 C), temperature source Oral, resp. rate 18, height 4\' 10"  (1.473 m), weight 62.1 kg, SpO2 96 %. Body mass index is 28.63 kg/m.  Medical Decision Making: Patient meets criteria for inpatient Mental healthcare for safety and stabilization.  We will resume home Medications when available.  Patient is Medically cleared by EDP and we will seek bed placement at any facility with available bed. Problem 1: Recurrent Major Depressive disorder, severe with Psychotic features.  Problem 2: Suicide ideation.  Disposition:  Admit, seek bed placement.  Earney Navy, NP-PMHNP-BC 07/15/2022 7:02 PM

## 2022-07-15 NOTE — ED Notes (Signed)
Pt son took all of her belongings home with him

## 2022-07-15 NOTE — Progress Notes (Signed)
LCSW Progress Note  [id number]   PHI CEREZO  07/15/2022  10:46 PM    Inpatient Behavioral Health Placement  Pt meets inpatient criteria per Earney Navy, NP-PMHNP-BC. There are no available beds within [facility] per [facility] Edythe Clarity, RN. Referral was sent to the following facilities;   Destination  Service Provider Address Phone Fax  [facility]  [address] [place] [address] [phone]  [facility]  [address] [place] [address] [phone]  [facility]  [address] [place] [address] [phone]  [facility]  [address] [place], [place] [address] [phone]  [facility]  [address], [place] [address] [phone]  [facility]  [address] [place], [place] [address] [phone]  [facility] [place] [address] [phone]  [facility]  [address], [place] [address] [phone]  [facility]  [address] [place], [place] [address] [phone]  [facility]  [address] [place] [address] [phone]  [facility]  [address] [place] [address] [phone]  [facility]  [address], [place] [address] [phone]  [facility]  [address], [place] [address] [phone]  [facility]  [address], [place] [address] [phone]  [facility]  [address] [place] [address] [phone]  [facility]  [address]  [place] [address] [phone]  [facility]  [address] [place] [address] [phone]  [facility]  [address] [place] [address] [phone]  [facility]  [address] [place] [address] [phone]  [facility]  [address], [place] [address] [phone]  [facility]  [address], [place] [address] [phone]  [facility]  [address], [place] [address] [phone]  [facility]  [address] [place] [address] [phone]  [facility]  [address] [place] [address] [phone]  [facility]  [address] [place] [address] [phone]  [facility]  [address] [place], [place] [address] [phone]  [facility]  [address], [place] [address] [phone]  [facility]  [address] [place] [address] [phone]    Situation ongoing,  CSW will follow up.    Maryjean Ka, MSW, [facility] 07/15/2022 10:46 PM

## 2022-07-15 NOTE — ED Provider Notes (Signed)
[place] EMERGENCY DEPARTMENT AT [facility] Provider Note   CSN: [id number] Arrival date & time: 07/15/22  1517     History Chief Complaint  Patient presents with   abnormal labs    HPI Darlene Barber is a 57 y.o. female presenting for chief complaint of abnormal labs.  States that she was following up with a nephrologist earlier this week found to have a GFR down to 16. History of similar secondary to sepsis and acute renal failure. Denies any new symptoms.  However she is also jointly coming in because of suicidal ideation.  States that she is having thoughts of wanting to kill herself which is new. Family states the main reason they are here is psychiatric evaluation. Otherwise ambulatory making urine.  Denies any urinary symptoms otherwise.  They do endorse decreased p.o. 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  -Asymptomatic   [name] recorded medical, surgical, social, medication list and allergies were reviewed in the Snapshot window as part of the initial history.   Review of Systems   Review of Systems  Constitutional:  Negative for chills and fever.  HENT:  Negative for ear pain and sore throat.   Eyes:  Negative for pain and visual disturbance.  Respiratory:  Negative for cough and shortness of breath.   Cardiovascular:  Negative for chest pain and palpitations.  Gastrointestinal:  Negative for abdominal pain and vomiting.  Genitourinary:  Negative for dysuria and hematuria.  Musculoskeletal:  Negative for arthralgias and back pain.  Skin:  Negative for color change and rash.  Neurological:  Negative for seizures and syncope.  All other systems reviewed and are negative.   Physical Exam Updated Vital Signs BP 138/87   Pulse 100   Temp 98.2 F (36.8 C) (Oral)   Resp 16   Ht 4\' 10"  (1.473 m)   Wt 62.1 kg   SpO2 100%   BMI 28.63 kg/m  Physical  Exam Vitals and nursing note reviewed.  Constitutional:      General: She is not in acute distress.    Appearance: She is well-developed.  HENT:     Head: Normocephalic and atraumatic.  Eyes:     Conjunctiva/sclera: Conjunctivae normal.  Cardiovascular:     Rate and Rhythm: Normal rate and regular rhythm.     Heart sounds: No murmur heard. Pulmonary:     Effort: Pulmonary effort is normal. No respiratory distress.     Breath sounds: Normal breath sounds.  Abdominal:     Palpations: Abdomen is soft.     Tenderness: There is no abdominal tenderness.  Musculoskeletal:        General: No swelling.     Cervical back: Neck supple.  Skin:    General: Skin is warm and dry.     Capillary Refill: Capillary refill takes less than 2 seconds.  Neurological:     Mental Status: She is alert.  Psychiatric:        Mood and Affect: Mood normal.      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 female who presented to the ED today with multiple complaints detailed above.     initial physical exam performed, notably the patient  was medically stable in no acute distress.      Reviewed and confirmed nursing documentation for past medical history, family history, social history.    Initial Assessment:   Patient has multiple primary complaints.  Primarily she is endorsing suicidal ideation.  States that she used to follow with psychiatry but she lost her insurance as part of the sepsis treatment and hospitalization last year. She has been lost to follow-up.  States that she is having  daily passive suicidal thoughts does not want to verbalize a plan from the rest of her family. Secondarily she states that she has had abnormal labs this week. Concerning her renal function extensive chart review reveals that her renal function is currently at baseline after her renal failure episode last year.  She is still making urine potassium is stable.  EKG does not show T wave abnormalities.  For this concern patient is stable to follow-up in the outpatient setting with nephrology for possible referral to renal transplant center. Consultation initiated with psychiatry for further long-term care and management regarding her suicidal ideation.  Patient placed in psychiatrical protocol pending their recommendations at this time.  Patient is medically cleared for psychiatric disposition.  Clinical Impression:  1. Suicidal ideation      Data Unavailable   Final Clinical Impression(s) / ED Diagnoses Final diagnoses:  Suicidal ideation    Rx / DC Orders ED Discharge Orders     None         Glyn Ade, MD 07/15/22 1654

## 2022-07-15 NOTE — ED Triage Notes (Signed)
Pt arrived via POV. Pt states they had labs drawn recently and their GP told them to come in, with concern for decreased kidney function. Pt reports chronic abdominal pain.  AOx4

## 2022-07-16 DIAGNOSIS — F333 Major depressive disorder, recurrent, severe with psychotic symptoms: Secondary | ICD-10-CM

## 2022-07-16 MED ORDER — LORATADINE 10 MG PO TABS: 10.0000 mg | ORAL_TABLET | Freq: Every evening | ORAL | Status: DC | PRN

## 2022-07-16 MED ORDER — TIOTROPIUM BROMIDE MONOHYDRATE 18 MCG IN CAPS: 18.0000 ug | ORAL_CAPSULE | Freq: Every day | RESPIRATORY_TRACT | Status: DC

## 2022-07-16 MED ORDER — UMECLIDINIUM BROMIDE 62.5 MCG/ACT IN AEPB
1.0000 | INHALATION_SPRAY | Freq: Every day | RESPIRATORY_TRACT | Status: DC
  Administered 2022-07-16 – 2022-07-17 (×2): 1 via RESPIRATORY_TRACT
  Filled 2022-07-16: qty 7

## 2022-07-16 MED ORDER — DOCUSATE SODIUM 100 MG PO CAPS
100.0000 mg | ORAL_CAPSULE | Freq: Every morning | ORAL | Status: DC
  Administered 2022-07-17: 100 mg via ORAL
  Filled 2022-07-16: qty 1

## 2022-07-16 MED ORDER — NORTRIPTYLINE HCL 25 MG PO CAPS
50.0000 mg | ORAL_CAPSULE | Freq: Every day | ORAL | Status: DC
  Administered 2022-07-16 – 2022-07-17 (×2): 50 mg via ORAL
  Filled 2022-07-16 (×2): qty 2

## 2022-07-16 MED ORDER — SUCRALFATE 1 G PO TABS
1.0000 g | ORAL_TABLET | Freq: Two times a day (BID) | ORAL | Status: DC
  Administered 2022-07-16 – 2022-07-17 (×3): 1 g via ORAL
  Filled 2022-07-16 (×4): qty 1

## 2022-07-16 MED ORDER — ALBUTEROL SULFATE HFA 108 (90 BASE) MCG/ACT IN AERS: 2.0000 | INHALATION_SPRAY | RESPIRATORY_TRACT | Status: DC | PRN

## 2022-07-16 MED ORDER — ONDANSETRON 4 MG PO TBDP
4.0000 mg | ORAL_TABLET | Freq: Once | ORAL | Status: AC
  Administered 2022-07-16: 4 mg via ORAL
  Filled 2022-07-16: qty 1

## 2022-07-16 MED ORDER — FERROUS SULFATE 325 (65 FE) MG PO TABS
325.0000 mg | ORAL_TABLET | Freq: Every day | ORAL | Status: DC
  Administered 2022-07-17: 325 mg via ORAL
  Filled 2022-07-16: qty 1

## 2022-07-16 MED ORDER — NORTRIPTYLINE HCL 25 MG PO CAPS: 25.0000 mg | ORAL_CAPSULE | ORAL | Status: DC

## 2022-07-16 MED ORDER — ACETAMINOPHEN 325 MG PO TABS: 650.0000 mg | ORAL_TABLET | Freq: Once | ORAL | Status: DC

## 2022-07-16 MED ORDER — PANTOPRAZOLE SODIUM 40 MG PO TBEC
40.0000 mg | DELAYED_RELEASE_TABLET | Freq: Every day | ORAL | Status: DC
  Administered 2022-07-16 – 2022-07-17 (×2): 40 mg via ORAL
  Filled 2022-07-16 (×2): qty 1

## 2022-07-16 MED ORDER — NORTRIPTYLINE HCL 25 MG PO CAPS
25.0000 mg | ORAL_CAPSULE | Freq: Every morning | ORAL | Status: DC
  Administered 2022-07-17: 25 mg via ORAL
  Filled 2022-07-16: qty 1

## 2022-07-16 MED ORDER — SUCRALFATE 1 G PO TABS: 1.0000 g | ORAL_TABLET | ORAL | Status: DC

## 2022-07-16 MED ORDER — ONDANSETRON 4 MG PO TBDP
4.0000 mg | ORAL_TABLET | Freq: Two times a day (BID) | ORAL | Status: DC | PRN
  Administered 2022-07-16 – 2022-07-17 (×2): 4 mg via ORAL
  Filled 2022-07-16 (×2): qty 1

## 2022-07-16 MED ORDER — PREGABALIN 25 MG PO CAPS
25.0000 mg | ORAL_CAPSULE | Freq: Every morning | ORAL | Status: DC
  Administered 2022-07-17: 25 mg via ORAL
  Filled 2022-07-16: qty 1

## 2022-07-16 MED ORDER — ACETAMINOPHEN 500 MG PO TABS
1000.0000 mg | ORAL_TABLET | Freq: Once | ORAL | Status: AC
  Administered 2022-07-16: 1000 mg via ORAL
  Filled 2022-07-16: qty 2

## 2022-07-16 NOTE — Progress Notes (Signed)
LCSW Progress Note  [id number]   Darlene Barber  07/16/2022  2:32 PM  Description:   Inpatient Psychiatric Referral  Patient was recommended inpatient per Dahlia Byes, NP. There are no available beds at [facility], or [facility], per [facility] Rona Ravens, RN. Patient was referred to the following out of network facilities:   [facility] Provider Address Phone Fax  [facility]  [address] [place] [address] [phone]  [facility]  [address] [place] [address] [phone]  [facility]  [address], [place] [address] [phone]  [facility]  [address] [place], [place] [address] [phone]  [facility] [address] [place]  [address] [place] [address] [phone]  [facility]  [address] [place], [place] [address] [phone]  [facility] [address] [place] [address] [phone]  [facility]  [address] [place] [address] [phone]  [facility]  [address] [place] [address] [phone]  [facility]  [address] [place] [address] [phone]  [facility]  [address] [place] [address] [phone]  [facility]  [address] [place] [address] [phone]  [facility]  [address] [place] [address] [phone]  [facility]  [address] [place] [address] [phone]  [facility]  [address] [place] [address] [phone]  [facility]  [address]  [place] [address] [phone]  [facility]  [address], [place] [address] [phone]  [facility]  [address], [place] [address] [phone]  [facility]  [address] [place] [address] [phone]  [facility]  [address], [place] [address] [phone]  [facility]  [address] [place] [address] [phone]  [facility]  [address] [place] [address] [phone]  [facility]  [address], [place] [address] [phone]  [facility]  [address] [place] [address] [phone]  [facility]  [address] [place], [place] [address] [phone]  [facility]  [address] [place] [address] [phone]  [facility]  [address], [place] [address] [phone]    Situation ongoing, CSW to continue following and update chart as more information becomes available.      Cathie Beams, Theresia Majors  07/16/2022 2:32 PM

## 2022-07-16 NOTE — ED Notes (Addendum)
Received call from [place] at [facility].  She is reviewing patient for possible admission.  She was questioning the nephrology consult.  Writer contacted Dr Preston Fleeting for clarity.  Per EDP patient is to seek outpatient nephrology consult upon discharge.  Called Alona Bene [phone] and left message for call back.

## 2022-07-16 NOTE — ED Provider Notes (Signed)
[name] home medications reordered.     Terald Sleeper, MD 07/16/22 [phone]

## 2022-07-16 NOTE — Progress Notes (Cosign Needed Addendum)
[facility] Psych ED Progress Note  07/16/2022 6:19 PM Darlene Barber  MRN:  [id number]   Subjective:  Darlene Barber is a 57 y.o. female patient admitted with long hx of depression, anxiety and reported Bipolar disorder originally came to the ER for Acute renal failure to be seen by Nephrologist.  However patient is now endorsing suicide ideation stating generalized pain, multiple Medical issues..  Patient continues to require inpatient Psychiatry hospitalization for treatment of depression.  Patient remains calm and cooperative.  Patient denies SI/HI/AVH.  Patient is back on her home medications and her kidney issues will be addressed in outpatient after Mental healthcare.  [name] records are faxed out to facilities for bed placement. Principal Problem: Severe episode of recurrent major depressive disorder, with psychotic features (HCC) Diagnosis:  Principal Problem:   Severe episode of recurrent major depressive disorder, with psychotic features [facility])   ED Assessment Time Calculation: Start Time: 1803 Stop Time: 1815 Total Time in Minutes (Assessment Completion): 12   Past Psychiatric History: see initial Psychiatry evaluation note  [place] Scale:  Flowsheet Row ED [facility] ED from 06/17/2022 in [facility] Emergency Department at [facility] ED to Hosp-Admission (Discharged) from 05/28/2022 in [facility] (1A)  C-SSRS RISK CATEGORY Low Risk No Risk No Risk       Past Medical History:  Past Medical History:  Diagnosis Date   Allergy    Anxiety    Bursitis of both hips    Depression    Frequent headaches    Obesity    Sleep apnea    doesn't use CPAP machine broken,     Past Surgical History:  Procedure Laterality Date   BIOPSY  03/30/2022   Procedure: BIOPSY;  Surgeon: Benancio Deeds, MD;  Location: [facility] ENDOSCOPY;  Service: Gastroenterology;;   CESAREAN SECTION     x2    CHOLECYSTECTOMY     COLONOSCOPY WITH PROPOFOL N/A 11/20/2017   Procedure: COLONOSCOPY WITH PROPOFOL;  Surgeon: Toney Reil, MD;  Location: [facility] ENDOSCOPY;  Service: Gastroenterology;  Laterality: N/A;   COLONOSCOPY WITH PROPOFOL N/A 03/30/2022   Procedure: COLONOSCOPY WITH PROPOFOL;  Surgeon: Benancio Deeds, MD;  Location[facility] ENDOSCOPY;  Service: Gastroenterology;  Laterality: N/A;   ESOPHAGOGASTRODUODENOSCOPY (EGD) WITH PROPOFOL N/A 03/30/2022   Procedure: ESOPHAGOGASTRODUODENOSCOPY (EGD) WITH PROPOFOL;  Surgeon: Benancio Deeds, MD;  Location: [facility] [place] ENDOSCOPY;  Service: Gastroenterology;  Laterality: N/A;   HYSTERECTOMY ABDOMINAL WITH SALPINGECTOMY  1998   HYSTEROSCOPY     Family History:  Family History  Problem Relation Age of Onset   Hodgkin's lymphoma Mother    Heart failure Maternal Grandmother    Alzheimer's disease Maternal Grandfather    Emphysema Paternal Grandmother    Diabetes Maternal Aunt    Diabetes Maternal Uncle    Cancer Other    Breast cancer Neg Hx    Family Psychiatric  History: see initial Psychiatry evaluation note Social History:  Social History   Substance and Sexual Activity  Alcohol Use Yes   Comment: occ once per year     Social History   Substance and Sexual Activity  Drug Use Yes   Types: Marijuana   Comment: Current use    Social History   Socioeconomic History   Marital status: Divorced    Spouse name: Not on file   Number of children: Not on file   Years of education: High School   Highest education  level: Not on file  Occupational History   Occupation: Unemployed  Tobacco Use   Smoking status: Every Day    Packs/day: 1.00    Years: 30.00    Additional pack years: 0.00    Total pack years: 30.00    Types: Cigarettes   Smokeless tobacco: Current  Vaping Use   Vaping Use: Never used  Substance and Sexual Activity   Alcohol use: Yes    Comment: occ once per year   Drug use: Yes    Types: Marijuana    Comment:  Current use   Sexual activity: Not Currently  Other Topics Concern   Not on file  Social History Narrative   Not on file   Social Determinants of Health   Financial Resource Strain: Not on file  Food Insecurity: No Food Insecurity (05/28/2022)   Hunger Vital Sign    Worried About Running Out of Food in the Last Year: Never true    Ran Out of Food in the Last Year: Never true  Transportation Needs: No Transportation Needs (05/28/2022)   PRAPARE - Administrator, Civil Service (Medical): No    Lack of Transportation (Non-Medical): No  Physical Activity: Not on file  Stress: Not on file  Social Connections: Not on file    Sleep: Fair  Appetite:  Fair  Current Medications: Current Facility-Administered Medications  Medication Dose Route Frequency Provider Last Rate Last Admin   acetaminophen (TYLENOL) tablet 650 mg  650 mg Oral Once Derwood Kaplan, MD       acyclovir (ZOVIRAX) tablet 400 mg  400 mg Oral BID Glyn Ade, MD   400 mg at 07/16/22 0932   albuterol (VENTOLIN HFA) 108 (90 Base) MCG/ACT inhaler 2 puff  2 puff Inhalation Q4H PRN Terald Sleeper, MD       [START ON 07/17/2022] docusate sodium (COLACE) capsule 100 mg  100 mg Oral q AM Terald Sleeper, MD       [START ON 07/17/2022] ferrous sulfate tablet 325 mg  325 mg Oral Q breakfast Trifan, Kermit Balo, MD       lidocaine HCl (PF) (XYLOCAINE) 2 % injection 50 mL  50 mL Other Once Alfredo Martinez, MD       loratadine (CLARITIN) tablet 10 mg  10 mg Oral QHS PRN Terald Sleeper, MD       [START ON 07/17/2022] nortriptyline (PAMELOR) capsule 25 mg  25 mg Oral q morning Trifan, Kermit Balo, MD       nortriptyline (PAMELOR) capsule 50 mg  50 mg Oral QHS Terald Sleeper, MD       ondansetron (ZOFRAN-ODT) disintegrating tablet 4 mg  4 mg Oral Q12H PRN Terald Sleeper, MD   4 mg at 07/16/22 0932   pantoprazole (PROTONIX) EC tablet 40 mg  40 mg Oral Daily Terald Sleeper, MD   40 mg at 07/16/22 1548   [START  ON 07/17/2022] pregabalin (LYRICA) capsule 25 mg  25 mg Oral q AM Terald Sleeper, MD       QUEtiapine (SEROQUEL) tablet 300 mg  300 mg Oral QHS Countryman, Chase, MD   300 mg at 07/15/22 2341   sucralfate (CARAFATE) tablet 1 g  1 g Oral BID WC Terald Sleeper, MD   1 g at 07/16/22 1607   traZODone (DESYREL) tablet 50 mg  50 mg Oral QHS PRN Glyn Ade, MD   50 mg at 07/15/22 2345   umeclidinium bromide (INCRUSE ELLIPTA) 62.5  MCG/ACT 1 puff  1 puff Inhalation Daily Terald Sleeper, MD   1 puff at 07/16/22 1548   Current Outpatient Medications  Medication Sig Dispense Refill   acyclovir (ZOVIRAX) 400 MG tablet Take 400 mg by mouth 2 (two) times daily.     albuterol (VENTOLIN HFA) 108 (90 Base) MCG/ACT inhaler INHALE 2 PUFFS EVERY FOUR HOURS AS NEEDED (Patient taking differently: Inhale 2 puffs into the lungs every 4 (four) hours as needed for wheezing or shortness of breath.) 18 g 0   B Complex-C (SUPER B COMPLEX PO) Take 1 tablet by mouth daily with breakfast.     BREYNA 160-4.5 MCG/ACT inhaler Inhale 2 puffs into the lungs 2 (two) times daily.     Calcium Carbonate Antacid (ANTACID SOFT CHEWS PO) Take 1 tablet by mouth every 6 (six) hours as needed (for indigesation- CHEW).     COLACE 100 MG capsule Take 100 mg by mouth in the morning.     EMGALITY 120 MG/ML SOSY Inject into the skin every 30 (thirty) days.     ferrous sulfate 325 (65 FE) MG tablet Take 325 mg by mouth daily with breakfast.     fluticasone (FLONASE) 50 MCG/ACT nasal spray 1 SPARY IN EACH NOSTRIL DAILY (Patient taking differently: Place 1 spray into both nostrils daily.) 16 g 0   loratadine (CLARITIN) 10 MG tablet Take 10 mg by mouth in the morning and at bedtime.     nortriptyline (PAMELOR) 25 MG capsule Take 25-50 mg by mouth See admin instructions. Take 25 mg (1 capsule) by mouth every morning, and 50 mg (2 capsules) every night at bedtime     omeprazole (PRILOSEC) 40 MG capsule Take 40 mg by mouth daily before  breakfast.     ondansetron (ZOFRAN-ODT) 4 MG disintegrating tablet Take 4 mg by mouth every 8 (eight) hours as needed for nausea.     pregabalin (LYRICA) 25 MG capsule Take 25 mg by mouth in the morning.     Probiotic Product (PROBIOTIC PO) Take 1 capsule by mouth daily.     QUEtiapine (SEROQUEL) 300 MG tablet Take 300 mg by mouth at bedtime.     sucralfate (CARAFATE) 1 g tablet Take 1 g by mouth See admin instructions. Take 1 gram by mouth in the morning and before supper     tiotropium (SPIRIVA) 18 MCG inhalation capsule Place 1 capsule (18 mcg total) into inhaler and inhale daily. 30 capsule 12   traZODone (DESYREL) 50 MG tablet Take 1 tablet (50 mg total) by mouth at bedtime as needed for sleep. (Patient taking differently: Take 50 mg by mouth at bedtime.) 30 tablet 11   acyclovir (ZOVIRAX) 200 MG capsule Take 2 capsules (400 mg total) by mouth 2 (two) times daily. (Patient not taking: Reported on 07/15/2022)     famotidine (PEPCID) 10 MG tablet Take 1 tablet (10 mg total) by mouth daily. (Patient not taking: Reported on 07/15/2022) 30 tablet 0   furosemide (LASIX) 20 MG tablet Take 1 tablet (20 mg total) by mouth daily. (Patient not taking: Reported on 07/15/2022) 5 tablet 0   lidocaine (LIDODERM) 5 % Place 1 patch onto the skin daily. Remove & Discard patch within 12 hours or as directed by MD (Patient not taking: Reported on 07/15/2022) 1 patch 0   loperamide (IMODIUM) 2 MG capsule Take 1 capsule (2 mg total) by mouth as needed for diarrhea or loose stools. (Patient not taking: Reported on 07/15/2022) 30 capsule 0   multivitamin (  RENA-VIT) TABS tablet Take 1 tablet by mouth at bedtime. (Patient not taking: Reported on 07/15/2022)  0   Nutritional Supplements (,FEEDING SUPPLEMENT, PROSOURCE PLUS) liquid Take 30 mLs by mouth 2 (two) times daily between meals. (Patient not taking: Reported on 07/15/2022)     promethazine (PHENERGAN) 25 MG tablet Take 1 tablet (25 mg total) by mouth every 6 (six) hours as needed  for nausea or vomiting. (Patient not taking: Reported on 07/15/2022) 90 tablet 0   sodium bicarbonate 650 MG tablet Take 1 tablet (650 mg total) by mouth 2 (two) times daily. (Patient not taking: Reported on 07/15/2022)      Lab Results:  Results for orders placed or performed during the hospital encounter of 07/15/22 (from the past 48 hour(s))  CBC with Differential     Status: Abnormal   Collection Time: 07/15/22  4:20 PM  Result Value Ref Range   WBC 9.4 4.0 - 10.5 K/uL   RBC 2.43 (L) 3.87 - 5.11 MIL/uL   Hemoglobin 7.7 (L) 12.0 - 15.0 g/dL   HCT 16.1 (L) 09.6 - 04.5 %   MCV 100.8 (H) 80.0 - 100.0 fL   MCH 31.7 26.0 - 34.0 pg   MCHC 31.4 30.0 - 36.0 g/dL   RDW 40.9 (H) 81.1 - 91.4 %   Platelets 522 (H) 150 - 400 K/uL   nRBC 0.0 0.0 - 0.2 %   Neutrophils Relative % 63 %   Neutro Abs 6.0 1.7 - 7.7 K/uL   Lymphocytes Relative 24 %   Lymphs Abs 2.2 0.7 - 4.0 K/uL   Monocytes Relative 6 %   Monocytes Absolute 0.6 0.1 - 1.0 K/uL   Eosinophils Relative 6 %   Eosinophils Absolute 0.6 (H) 0.0 - 0.5 K/uL   Basophils Relative 1 %   Basophils Absolute 0.1 0.0 - 0.1 K/uL   Immature Granulocytes 0 %   Abs Immature Granulocytes 0.03 0.00 - 0.07 K/uL    Comment: Performed at [facility], [address] [place] [address] [place], [place] [address]  Basic metabolic panel     Status: Abnormal   Collection Time: 07/15/22  4:20 PM  Result Value Ref Range   Sodium 139 135 - 145 mmol/L   Potassium 4.9 3.5 - 5.1 mmol/L   Chloride 108 98 - 111 mmol/L   CO2 24 22 - 32 mmol/L   Glucose, Bld 80 70 - 99 mg/dL    Comment: Glucose reference range applies only to samples taken after fasting for at least 8 hours.   BUN 32 (H) 6 - 20 mg/dL   Creatinine, Ser 6.21 (H) 0.44 - 1.00 mg/dL   Calcium 9.1 8.9 - 30.8 mg/dL   GFR, Estimated 17 (L) >60 mL/min    Comment: (NOTE) Calculated using the CKD-EPI Creatinine Equation (2021)    Anion gap 7 5 - 15    Comment: Performed at [facility],  [address], [place], [place] [address]    Blood Alcohol level:  No results found for: "ETH"  Physical Findings:  CIWA:    COWS:     Musculoskeletal: Strength & Muscle Tone: within normal limits Gait & Station: normal Patient leans: Front  Psychiatric Specialty Exam:  Presentation  General Appearance:  Casual; Neat  Eye Contact: Fleeting  Speech: Clear and Coherent; Normal Rate  Speech Volume: Normal  Handedness: Right   Mood and Affect  Mood: Depressed; Anxious; Angry  Affect: Congruent; Depressed   Thought Process  Thought Processes: Coherent; Goal  Directed; Linear  Descriptions of Associations:Intact  Orientation:Full (Time, Place and Person)  Thought Content:Logical  History of Schizophrenia/Schizoaffective disorder:No data recorded Duration of Psychotic Symptoms:No data recorded Hallucinations:Hallucinations: None Description of Visual Hallucinations: sees things she cannot erxplain but not shadows.  Ideas of Reference:None  Suicidal Thoughts:Suicidal Thoughts: No SI Passive Intent and/or Plan: Without Plan  Homicidal Thoughts:Homicidal Thoughts: No   Sensorium  Memory: Immediate Good; Recent Good; Remote Fair  Judgment: Intact  Insight: Present   Executive Functions  Concentration: Good  Attention Span: Good  Recall: Good  Fund of Knowledge: Good  Language: Good   Psychomotor Activity  Psychomotor Activity: Psychomotor Activity: Normal   Assets  Assets: Communication Skills; Housing; Desire for Improvement; Social Support   Sleep  Sleep: Sleep: Fair    Physical Exam: Physical Exam Vitals and nursing note reviewed.  Constitutional:      Appearance: Normal appearance. She is ill-appearing.  HENT:     Head: Normocephalic and atraumatic.     Nose: Nose normal.  Cardiovascular:     Rate and Rhythm: Normal rate and regular rhythm.  Pulmonary:     Effort: Pulmonary effort is normal.   Musculoskeletal:        General: Normal range of motion.     Cervical back: Normal range of motion.  Skin:    General: Skin is warm and dry.     Coloration: Skin is pale.  Neurological:     Mental Status: She is alert and oriented to person, place, and time.  Psychiatric:        Attention and Perception: Attention and perception normal.        Mood and Affect: Mood is anxious and depressed.        Speech: Speech normal.        Behavior: Behavior is cooperative.        Thought Content: Thought content normal.        Cognition and Memory: Cognition and memory normal.    Review of Systems  Constitutional: Negative.   HENT: Negative.    Eyes: Negative.   Respiratory: Negative.    Cardiovascular: Negative.   Gastrointestinal: Negative.   Genitourinary:        Chronic Kidney disease.  Musculoskeletal: Negative.   Skin: Negative.   Neurological: Negative.   Endo/Heme/Allergies: Negative.   Psychiatric/Behavioral:  Positive for depression. The patient is nervous/anxious and has insomnia.    Blood pressure (!) 144/92, pulse 90, temperature 98.2 F (36.8 C), temperature source Oral, resp. rate 16, height 4\' 10"  (1.473 m), weight 62.1 kg, SpO2 99 %. Body mass index is 28.63 kg/m.   Medical Decision Making: Patient is calm and cooperative and Medication compliant.  She continues to require inpatient Psychiatry hospitalization.   Records are faxed out to facilities for available bed. Disposition:  Admit, seek bed placement.   Earney Navy, NP-PMHNP-BC 07/16/2022, 6:19 PM

## 2022-07-16 NOTE — Progress Notes (Signed)
Pt was accepted to [facility] TOMORROW 07/17/2022; Bed Assignment Main Campus   Pt meets inpatient criteria per Earney Navy, NP-PMHNP-BC     Attending Physician will be Dr. Loni Beckwith   Report can be called [phone]-Pager number, please leave a returned phone number to receive a phone call back.     Pt can arrive after 9:00am   Care Team notified: Day [facility] Rona Ravens, RN, CSW Disposition [place], LCSWA, Earney Navy, NP-PMHNP-BC, Dalia Heading, RN, Kiristin Ryan, RN   Maryjean Ka, MSW, [facility] 07/16/2022 5:57 PM

## 2022-07-17 NOTE — ED Provider Notes (Signed)
I assumed care of the patient at 1500.  Patient is awaiting transport to [facility].  Her transportation has arrived.  Seems safe for transport.   Melene Plan, DO 07/17/22 2219

## 2022-07-17 NOTE — Discharge Instructions (Addendum)
Transfer/transport to [facility].

## 2022-07-17 NOTE — ED Provider Notes (Signed)
Emergency Medicine Observation Re-evaluation Note  Darlene Barber is a 57 y.o. female, seen on rounds today.  Pt initially presented to the ED for complaints of feeling depressed with SI. Pt also noted with CKD. No new c/o this AM.  Physical Exam  BP 138/86 (BP Location: Left Arm)   Pulse (!) 102 Comment: pt was coming back from the restroom  Temp 97.6 F (36.4 C) (Oral)   Resp 18   Ht 1.473 m (4\' 10" )   Wt 62.1 kg   SpO2 100%   BMI 28.63 kg/m  Physical Exam General: resting, nad.  Cardiac: regular rate.  Lungs: breathing comfortably. Psych: calm.   ED Course / MDM    I have reviewed the labs performed to date as well as medications administered while in observation.  Recent changes in the last 24 hours include ED obs, reassessment.   Plan  Patient has been accepted at [facility], Dr Sofie Hartigan.   Pt currently appears stable for transfer/transport.       Cathren Laine, MD 07/17/22 [phone]

## 2022-07-17 NOTE — ED Notes (Signed)
  Pt was accepted to [facility] TOMORROW 07/17/2022; Bed Assignment Main Campus    Pt meets inpatient criteria per Earney Navy, NP-PMHNP-BC     Attending Physician will be Dr. Loni Beckwith    Report can be called to:[phone]-Pager number, please leave a returned phone number to receive a phone call back.

## 2022-07-22 ENCOUNTER — Encounter: Payer: Self-pay | Admitting: *Deleted

## 2022-07-30 ENCOUNTER — Other Ambulatory Visit: Payer: Self-pay

## 2022-07-30 ENCOUNTER — Encounter: Payer: Self-pay | Admitting: Intensive Care

## 2022-07-30 ENCOUNTER — Emergency Department: Payer: Medicaid Other

## 2022-07-30 ENCOUNTER — Inpatient Hospital Stay
Admission: EM | Admit: 2022-07-30 | Discharge: 2022-08-06 | DRG: 330 | Disposition: A | Discharge status: Home Health | Payer: Medicaid Other | Attending: Internal Medicine | Admitting: Internal Medicine

## 2022-07-30 DIAGNOSIS — Z8249 Family history of ischemic heart disease and other diseases of the circulatory system: Secondary | ICD-10-CM

## 2022-07-30 DIAGNOSIS — E876 Hypokalemia: Secondary | ICD-10-CM | POA: Diagnosis present

## 2022-07-30 DIAGNOSIS — F209 Schizophrenia, unspecified: Secondary | ICD-10-CM | POA: Diagnosis present

## 2022-07-30 DIAGNOSIS — D638 Anemia in other chronic diseases classified elsewhere: Secondary | ICD-10-CM | POA: Diagnosis not present

## 2022-07-30 DIAGNOSIS — D62 Acute posthemorrhagic anemia: Secondary | ICD-10-CM

## 2022-07-30 DIAGNOSIS — K56699 Other intestinal obstruction unspecified as to partial versus complete obstruction: Secondary | ICD-10-CM | POA: Diagnosis not present

## 2022-07-30 DIAGNOSIS — M549 Dorsalgia, unspecified: Secondary | ICD-10-CM | POA: Diagnosis present

## 2022-07-30 DIAGNOSIS — Z833 Family history of diabetes mellitus: Secondary | ICD-10-CM

## 2022-07-30 DIAGNOSIS — Z560 Unemployment, unspecified: Secondary | ICD-10-CM

## 2022-07-30 DIAGNOSIS — D631 Anemia in chronic kidney disease: Secondary | ICD-10-CM | POA: Diagnosis present

## 2022-07-30 DIAGNOSIS — Z9079 Acquired absence of other genital organ(s): Secondary | ICD-10-CM

## 2022-07-30 DIAGNOSIS — K529 Noninfective gastroenteritis and colitis, unspecified: Secondary | ICD-10-CM | POA: Diagnosis not present

## 2022-07-30 DIAGNOSIS — I129 Hypertensive chronic kidney disease with stage 1 through stage 4 chronic kidney disease, or unspecified chronic kidney disease: Secondary | ICD-10-CM | POA: Diagnosis present

## 2022-07-30 DIAGNOSIS — Z79899 Other long term (current) drug therapy: Secondary | ICD-10-CM

## 2022-07-30 DIAGNOSIS — Z7985 Long-term (current) use of injectable non-insulin antidiabetic drugs: Secondary | ICD-10-CM

## 2022-07-30 DIAGNOSIS — Z825 Family history of asthma and other chronic lower respiratory diseases: Secondary | ICD-10-CM

## 2022-07-30 DIAGNOSIS — K922 Gastrointestinal hemorrhage, unspecified: Secondary | ICD-10-CM | POA: Diagnosis present

## 2022-07-30 DIAGNOSIS — R682 Dry mouth, unspecified: Secondary | ICD-10-CM | POA: Diagnosis not present

## 2022-07-30 DIAGNOSIS — N184 Chronic kidney disease, stage 4 (severe): Secondary | ICD-10-CM

## 2022-07-30 DIAGNOSIS — Z66 Do not resuscitate: Secondary | ICD-10-CM | POA: Diagnosis present

## 2022-07-30 DIAGNOSIS — Z807 Family history of other malignant neoplasms of lymphoid, hematopoietic and related tissues: Secondary | ICD-10-CM | POA: Diagnosis not present

## 2022-07-30 DIAGNOSIS — Z90710 Acquired absence of both cervix and uterus: Secondary | ICD-10-CM

## 2022-07-30 DIAGNOSIS — G4730 Sleep apnea, unspecified: Secondary | ICD-10-CM | POA: Diagnosis present

## 2022-07-30 DIAGNOSIS — D649 Anemia, unspecified: Secondary | ICD-10-CM | POA: Diagnosis present

## 2022-07-30 DIAGNOSIS — Z9049 Acquired absence of other specified parts of digestive tract: Secondary | ICD-10-CM

## 2022-07-30 DIAGNOSIS — F32A Depression, unspecified: Secondary | ICD-10-CM | POA: Diagnosis present

## 2022-07-30 DIAGNOSIS — I10 Essential (primary) hypertension: Secondary | ICD-10-CM | POA: Diagnosis not present

## 2022-07-30 DIAGNOSIS — J449 Chronic obstructive pulmonary disease, unspecified: Secondary | ICD-10-CM | POA: Diagnosis present

## 2022-07-30 DIAGNOSIS — Z820 Family history of epilepsy and other diseases of the nervous system: Secondary | ICD-10-CM

## 2022-07-30 DIAGNOSIS — E872 Acidosis, unspecified: Secondary | ICD-10-CM

## 2022-07-30 DIAGNOSIS — G8929 Other chronic pain: Secondary | ICD-10-CM | POA: Diagnosis present

## 2022-07-30 DIAGNOSIS — Z87891 Personal history of nicotine dependence: Secondary | ICD-10-CM

## 2022-07-30 DIAGNOSIS — N200 Calculus of kidney: Secondary | ICD-10-CM | POA: Insufficient documentation

## 2022-07-30 DIAGNOSIS — Z87440 Personal history of urinary (tract) infections: Secondary | ICD-10-CM

## 2022-07-30 DIAGNOSIS — F3289 Other specified depressive episodes: Secondary | ICD-10-CM | POA: Diagnosis not present

## 2022-07-30 DIAGNOSIS — N185 Chronic kidney disease, stage 5: Secondary | ICD-10-CM

## 2022-07-30 DIAGNOSIS — Z91040 Latex allergy status: Secondary | ICD-10-CM

## 2022-07-30 LAB — CBC WITH DIFFERENTIAL/PLATELET
Abs Immature Granulocytes: 0.02 10*3/uL (ref 0.00–0.07)
Basophils Absolute: 0.1 10*3/uL (ref 0.0–0.1)
Basophils Relative: 1 %
Eosinophils Absolute: 0.3 10*3/uL (ref 0.0–0.5)
Eosinophils Relative: 3 %
HCT: 20.6 % — ABNORMAL LOW (ref 36.0–46.0)
Hemoglobin: 6.5 g/dL — ABNORMAL LOW (ref 12.0–15.0)
Immature Granulocytes: 0 %
Lymphocytes Relative: 27 %
Lymphs Abs: 2.5 10*3/uL (ref 0.7–4.0)
MCH: 32.2 pg (ref 26.0–34.0)
MCHC: 31.6 g/dL (ref 30.0–36.0)
MCV: 102 fL — ABNORMAL HIGH (ref 80.0–100.0)
Monocytes Absolute: 0.6 10*3/uL (ref 0.1–1.0)
Monocytes Relative: 7 %
Neutro Abs: 5.9 10*3/uL (ref 1.7–7.7)
Neutrophils Relative %: 62 %
Platelets: 371 10*3/uL (ref 150–400)
RBC: 2.02 MIL/uL — ABNORMAL LOW (ref 3.87–5.11)
RDW: 15.1 % (ref 11.5–15.5)
WBC: 9.3 10*3/uL (ref 4.0–10.5)
nRBC: 0 % (ref 0.0–0.2)

## 2022-07-30 LAB — TYPE AND SCREEN
Antibody Screen: NEGATIVE
Unit division: 0

## 2022-07-30 LAB — COMPREHENSIVE METABOLIC PANEL
ALT: 9 U/L (ref 0–44)
AST: 11 U/L — ABNORMAL LOW (ref 15–41)
Albumin: 2.9 g/dL — ABNORMAL LOW (ref 3.5–5.0)
Alkaline Phosphatase: 62 U/L (ref 38–126)
Anion gap: 5 (ref 5–15)
BUN: 37 mg/dL — ABNORMAL HIGH (ref 6–20)
CO2: 18 mmol/L — ABNORMAL LOW (ref 22–32)
Calcium: 8.7 mg/dL — ABNORMAL LOW (ref 8.9–10.3)
Chloride: 113 mmol/L — ABNORMAL HIGH (ref 98–111)
Creatinine, Ser: 2.97 mg/dL — ABNORMAL HIGH (ref 0.44–1.00)
GFR, Estimated: 18 mL/min — ABNORMAL LOW (ref >60)
Glucose, Bld: 104 mg/dL — ABNORMAL HIGH (ref 70–99)
Potassium: 3.7 mmol/L (ref 3.5–5.1)
Sodium: 136 mmol/L (ref 135–145)
Total Bilirubin: 0.4 mg/dL (ref 0.3–1.2)
Total Protein: 5.9 g/dL — ABNORMAL LOW (ref 6.5–8.1)

## 2022-07-30 LAB — URINALYSIS, ROUTINE W REFLEX MICROSCOPIC
Bilirubin Urine: NEGATIVE
Glucose, UA: NEGATIVE mg/dL
Hgb urine dipstick: NEGATIVE
Ketones, ur: NEGATIVE mg/dL
Leukocytes,Ua: NEGATIVE
Nitrite: NEGATIVE
Protein, ur: NEGATIVE mg/dL
Specific Gravity, Urine: 1.009 (ref 1.005–1.030)
pH: 5 (ref 5.0–8.0)

## 2022-07-30 LAB — BPAM RBC: Unit Type and Rh: 600

## 2022-07-30 LAB — PREPARE RBC (CROSSMATCH)

## 2022-07-30 MED ORDER — INSULIN ASPART 100 UNIT/ML IJ SOLN: 0.0000 [IU] | Freq: Every day | INTRAMUSCULAR | Status: DC

## 2022-07-30 MED ORDER — PANTOPRAZOLE SODIUM 40 MG PO TBEC
40.0000 mg | DELAYED_RELEASE_TABLET | Freq: Once | ORAL | Status: AC
  Administered 2022-07-30: 40 mg via ORAL
  Filled 2022-07-30: qty 1

## 2022-07-30 MED ORDER — METRONIDAZOLE 500 MG/100ML IV SOLN
500.0000 mg | Freq: Three times a day (TID) | INTRAVENOUS | Status: DC
  Administered 2022-07-31 – 2022-08-02 (×7): 500 mg via INTRAVENOUS
  Filled 2022-07-30 (×8): qty 100

## 2022-07-30 MED ORDER — INSULIN ASPART 100 UNIT/ML IJ SOLN: 0.0000 [IU] | Freq: Three times a day (TID) | INTRAMUSCULAR | Status: DC

## 2022-07-30 MED ORDER — SODIUM CHLORIDE 0.9 % IV SOLN
2.0000 g | Freq: Once | INTRAVENOUS | Status: AC
  Administered 2022-07-30: 2 g via INTRAVENOUS
  Filled 2022-07-30: qty 20

## 2022-07-30 MED ORDER — SODIUM CHLORIDE 0.9 % IV SOLN
1.0000 g | INTRAVENOUS | Status: DC
  Administered 2022-07-31 – 2022-08-01 (×2): 1 g via INTRAVENOUS
  Filled 2022-07-30 (×2): qty 10

## 2022-07-30 MED ORDER — SUCRALFATE 1 G PO TABS
1.0000 g | ORAL_TABLET | Freq: Once | ORAL | Status: AC
  Administered 2022-07-30: 1 g via ORAL
  Filled 2022-07-30: qty 1

## 2022-07-30 MED ORDER — SODIUM BICARBONATE 650 MG PO TABS
650.0000 mg | ORAL_TABLET | Freq: Three times a day (TID) | ORAL | Status: DC
  Administered 2022-07-31 – 2022-08-06 (×19): 650 mg via ORAL
  Filled 2022-07-30 (×20): qty 1

## 2022-07-30 MED ORDER — PANTOPRAZOLE SODIUM 40 MG IV SOLR
40.0000 mg | Freq: Two times a day (BID) | INTRAVENOUS | Status: DC
  Administered 2022-07-31 – 2022-08-06 (×11): 40 mg via INTRAVENOUS
  Filled 2022-07-30 (×12): qty 10

## 2022-07-30 MED ORDER — SODIUM CHLORIDE 0.9 % IV SOLN
10.0000 mL/h | Freq: Once | INTRAVENOUS | Status: AC
  Administered 2022-07-30: 10 mL/h via INTRAVENOUS

## 2022-07-30 MED ORDER — MORPHINE SULFATE (PF) 4 MG/ML IV SOLN
4.0000 mg | Freq: Once | INTRAVENOUS | Status: AC
  Administered 2022-07-30: 4 mg via INTRAVENOUS
  Filled 2022-07-30: qty 1

## 2022-07-30 MED ORDER — METRONIDAZOLE 500 MG/100ML IV SOLN
500.0000 mg | Freq: Once | INTRAVENOUS | Status: AC
  Administered 2022-07-30: 500 mg via INTRAVENOUS
  Filled 2022-07-30: qty 100

## 2022-07-30 NOTE — ED Provider Notes (Signed)
[facility] Provider Note    Event Date/Time   First MD Initiated Contact with Patient 07/30/22 1659     (approximate)   History   Abnormal Lab and Rectal Bleeding   HPI  Darlene Barber is a 57 y.o. female with history of CKD, schizophrenia, hypertension, GI bleed secondary to colitis previously requiring prolonged admission with multiple units of blood transfusion presenting to the emergency department for evaluation of rectal bleeding.  Patient reports she has had recurrent rectal bleeding over the past couple weeks.  She recently had an patient psych admission when this began.  She saw her nephrologist today and had lab work performed that demonstrated a low hemoglobin, so she was directed to the ER for further evaluation.  She does report that she has had some ongoing abdominal pain consistent with her prior episodes of colitis.  Her stool is dark, maroon in color, denies bright red blood.     Physical Exam   Triage Vital Signs: ED Triage Vitals  Enc Vitals Group     BP 07/30/22 1506 125/84     Pulse Rate 07/30/22 1506 100     Resp 07/30/22 1506 16     Temp 07/30/22 1506 98.8 F (37.1 C)     Temp Source 07/30/22 1506 Oral     SpO2 07/30/22 1506 99 %     Weight 07/30/22 1510 137 lb (62.1 kg)     Height 07/30/22 1510 4\' 10"  (1.473 m)     Head Circumference --      Peak Flow --      Pain Score 07/30/22 1510 9     Pain Loc --      Pain Edu? --      Excl. in GC? --     Most recent vital signs: Vitals:   07/30/22 2130 07/30/22 2200  BP:  108/70  Pulse:  90  Resp:  16  Temp: 98.3 F (36.8 C) 98.8 F (37.1 C)  SpO2:  100%     General: Awake, interactive  CV:  Regular rate, good peripheral perfusion.  Resp:  Lungs clear, unlabored respirations.  Abd:  Soft, nondistended, mild diffuse tenderness to palpation.  Dark brown slightly red stool on rectal exam, Hemoccult positive, nonbleeding external hemorrhoids noted Neuro:  Symmetric facial  movement, fluid speech   ED Results / Procedures / Treatments   Labs (all labs ordered are listed, but only abnormal results are displayed) Labs Reviewed  CBC WITH DIFFERENTIAL/PLATELET - Abnormal; Notable for the following components:      Result Value   RBC 2.02 (*)    Hemoglobin 6.5 (*)    HCT 20.6 (*)    MCV 102.0 (*)    All other components within normal limits  COMPREHENSIVE METABOLIC PANEL - Abnormal; Notable for the following components:   Chloride 113 (*)    CO2 18 (*)    Glucose, Bld 104 (*)    BUN 37 (*)    Creatinine, Ser 2.97 (*)    Calcium 8.7 (*)    Total Protein 5.9 (*)    Albumin 2.9 (*)    AST 11 (*)    GFR, Estimated 18 (*)    All other components within normal limits  URINALYSIS, ROUTINE W REFLEX MICROSCOPIC - Abnormal; Notable for the following components:   Color, Urine STRAW (*)    APPearance CLEAR (*)    All other components within normal limits  GASTROINTESTINAL PANEL BY PCR, STOOL (REPLACES STOOL  CULTURE)  C DIFFICILE QUICK SCREEN W PCR REFLEX    OVA + PARASITE EXAM  PROTIME-INR  APTT  CBC  TYPE AND SCREEN  PREPARE RBC (CROSSMATCH)     EKG EKG independently reviewed interpreted by myself (ER attending) demonstrates:    RADIOLOGY Imaging independently reviewed and interpreted by myself demonstrates:  CT abdomen pelvis demonstrates acute colitis of the ascending and transverse colon  PROCEDURES:  Critical Care performed: Yes, see critical care procedure note(s)  CRITICAL CARE Performed by: Trinna Post   Total critical care time: 30 minutes  Critical care time was exclusive of separately billable procedures and treating other patients.  Critical care was necessary to treat or prevent imminent or life-threatening deterioration.  Critical care was time spent personally by me on the following activities: development of treatment plan with patient and/or surrogate as well as nursing, discussions with consultants, evaluation of patient's  response to treatment, examination of patient, obtaining history from patient or surrogate, ordering and performing treatments and interventions, ordering and review of laboratory studies, ordering and review of radiographic studies, pulse oximetry and re-evaluation of patient's condition.   Procedures   MEDICATIONS ORDERED IN ED: Medications  cefTRIAXone (ROCEPHIN) 1 g in sodium chloride 0.9 % 100 mL IVPB (has no administration in time range)  metroNIDAZOLE (FLAGYL) IVPB 500 mg (has no administration in time range)  insulin aspart (novoLOG) injection 0-9 Units (has no administration in time range)  insulin aspart (novoLOG) injection 0-5 Units (has no administration in time range)  pantoprazole (PROTONIX) injection 40 mg (has no administration in time range)  sodium bicarbonate tablet 650 mg (has no administration in time range)  0.9 %  sodium chloride infusion (0 mL/hr Intravenous Stopped 07/30/22 2216)  sucralfate (CARAFATE) tablet 1 g (1 g Oral Given 07/30/22 2231)  pantoprazole (PROTONIX) EC tablet 40 mg (40 mg Oral Given 07/30/22 2231)  cefTRIAXone (ROCEPHIN) 2 g in sodium chloride 0.9 % 100 mL IVPB (0 g Intravenous Stopped 07/30/22 2306)  metroNIDAZOLE (FLAGYL) IVPB 500 mg (0 mg Intravenous Stopped 07/31/22 0020)  morphine (PF) 4 MG/ML injection 4 mg (4 mg Intravenous Given 07/30/22 2232)     IMPRESSION / MDM / ASSESSMENT AND PLAN / ED COURSE  I reviewed the triage vital signs and the nursing notes.  Differential diagnosis includes, but is not limited to, anemia secondary to acute rectal bleeding from colitis, diverticulosis, upper GI bleeding source, consideration for component of decreased production in the setting of renal disease  [name] presentation is most consistent with acute presentation with potential threat to life or bodily function.  57 year old female presenting with rectal bleeding and anemia.  Her CT here does demonstrate recurrent colitis.  I suspect that this is the  etiology of her recurrent rectal bleeding.  With her hemoglobin of 6.5, she was transfused 1 unit of PRBCs.  Has been hemodynamically stable here.  Renal function impaired, but similar to recent prior.  With her ongoing bleeding and acute colitis, do think she is appropriate for admission.  I have ordered her for Rocephin and Flagyl.  Will reach out to hospitalist team for admission.      FINAL CLINICAL IMPRESSION(S) / ED DIAGNOSES   Final diagnoses:  Colitis  Rectal bleeding     Rx / DC Orders   ED Discharge Orders     None        Note:  This document was prepared using Dragon voice recognition software and may include unintentional dictation errors.   Ciena Sampley, Danie Binder,  MD 07/31/22 1610

## 2022-07-30 NOTE — ED Triage Notes (Signed)
Patient reports being sent by doctor for abnormal hemoglobin and kidney levels. Also c/o rectal bleeding. C/o right sided flank pain

## 2022-07-30 NOTE — H&P (Signed)
History and Physical    Patient: Darlene Barber:[id number] DOB: 13-Jan-1966 DOA: 07/30/2022 DOS: the patient was seen and examined on 07/30/2022 PCP: Gracelyn Nurse, MD  Patient coming from:  sent in by nephrology outpatient clinic  Chief Complaint:  Chief Complaint  Patient presents with   Abnormal Lab   Rectal Bleeding   HPI: Darlene Barber is a 57 y.o. female with medical history significant of septic shock admission at [facility] in late December 2023.  That hospitalization was complicated by development of lower GI bleed on April 06, 2022 with subsequent colonoscopy showing multiple large ulcers of the large bowel.  6 patient was subsequently discharged in February 2024.  Unfortunately patient has had recurrent episodes of radiologic diagnosis of colitis as well as finding of lower GI bleed since then.  Most recently patient comes in now with about 2 weeks of rather daily lower GI bleed varying from a single episode in a day to 5 bloody stools in a day associated with generalized abdominal cramps.  No vomiting is reported no fever.  Patient was evaluated at office nephrology today and found to have severe anemia and sent to [facility] ER.  There is no report of patient having chest pain shortness of breath presyncope or palpitation.  Review of Systems: As mentioned in the history of present illness. All other systems reviewed and are negative. Past Medical History:  Diagnosis Date   Allergy    Anxiety    Bursitis of both hips    Depression    Frequent headaches    Obesity    Sleep apnea    doesn't use CPAP machine broken,    Past Surgical History:  Procedure Laterality Date   BIOPSY  03/30/2022   Procedure: BIOPSY;  Surgeon: Benancio Deeds, MD;  Location: [facility] ENDOSCOPY;  Service: Gastroenterology;;   CESAREAN SECTION     x2   CHOLECYSTECTOMY     COLONOSCOPY WITH PROPOFOL N/A 11/20/2017   Procedure: COLONOSCOPY WITH PROPOFOL;  Surgeon: Toney Reil, MD;   Location: [facility] ENDOSCOPY;  Service: Gastroenterology;  Laterality: N/A;   COLONOSCOPY WITH PROPOFOL N/A 03/30/2022   Procedure: COLONOSCOPY WITH PROPOFOL;  Surgeon: Benancio Deeds, MD;  Location: [facility] ENDOSCOPY;  Service: Gastroenterology;  Laterality: N/A;   ESOPHAGOGASTRODUODENOSCOPY (EGD) WITH PROPOFOL N/A 03/30/2022   Procedure: ESOPHAGOGASTRODUODENOSCOPY (EGD) WITH PROPOFOL;  Surgeon: Benancio Deeds, MD;  Location: [facility] ENDOSCOPY;  Service: Gastroenterology;  Laterality: N/A;   HYSTERECTOMY ABDOMINAL WITH SALPINGECTOMY  1998   HYSTEROSCOPY     Social History:  reports that she has quit smoking. Her smoking use included cigarettes and cigars. She has a 30.00 pack-year smoking history. She has never used smokeless tobacco. She reports current alcohol use. She reports current drug use. Drug: Marijuana.  Allergies  Allergen Reactions   Other Anaphylaxis, Itching, Swelling and Other (See Comments)    Black pepper   Black Pepper-Turmeric    Latex Rash   Silicone Rash   Tape Rash and Other (See Comments)    Paper tape is ok to use.    Family History  Problem Relation Age of Onset   Hodgkin's lymphoma Mother    Heart failure Maternal Grandmother    Alzheimer's disease Maternal Grandfather    Emphysema Paternal Grandmother    Diabetes Maternal Aunt    Diabetes Maternal Uncle    Cancer Other    Breast cancer Neg Hx     Prior to Admission medications   Medication Sig Start  Date End Date Taking? Authorizing Provider  acyclovir (ZOVIRAX) 400 MG tablet Take 400 mg by mouth 2 (two) times daily.   Yes [provider]  albuterol (VENTOLIN HFA) 108 (90 Base) MCG/ACT inhaler INHALE 2 PUFFS EVERY FOUR HOURS AS NEEDED Patient taking differently: Inhale 2 puffs into the lungs every 4 (four) hours as needed for wheezing or shortness of breath. 10/08/21  Yes Karamalegos, Alexander J, DO  amLODipine (NORVASC) 10 MG tablet Take 10 mg by mouth daily.   Yes [provider]  B  Complex-C (SUPER B COMPLEX PO) Take 1 tablet by mouth daily with breakfast.   Yes [provider]  brexpiprazole (REXULTI) 1 MG TABS tablet Take 1 mg by mouth daily.   Yes [provider]  BREYNA 160-4.5 MCG/ACT inhaler Inhale 2 puffs into the lungs 2 (two) times daily.   Yes [provider]  Calcium Carbonate Antacid (ANTACID SOFT CHEWS PO) Take 1 tablet by mouth every 6 (six) hours as needed (for indigesation- CHEW).   Yes [provider]  COLACE 100 MG capsule Take 100 mg by mouth in the morning.   Yes [provider]  Exenatide ER 2 MG PEN Inject 1 Dose into the skin once a week.   Yes [provider]  fluticasone (FLONASE) 50 MCG/ACT nasal spray 1 SPARY IN EACH NOSTRIL DAILY Patient taking differently: Place 1 spray into both nostrils daily. 10/08/21  Yes Karamalegos, Netta Neat, DO  loratadine (CLARITIN) 10 MG tablet Take 10 mg by mouth in the morning and at bedtime.   Yes [provider]  nortriptyline (PAMELOR) 25 MG capsule Take 25-50 mg by mouth See admin instructions. Take 25 mg (1 capsule) by mouth every morning, and 50 mg (2 capsules) every night at bedtime 10/24/20  Yes [provider]  omeprazole (PRILOSEC) 40 MG capsule Take 40 mg by mouth daily before breakfast.   Yes [provider]  ondansetron (ZOFRAN-ODT) 4 MG disintegrating tablet Take 4 mg by mouth every 8 (eight) hours as needed for nausea.   Yes [provider]  pregabalin (LYRICA) 25 MG capsule Take 25 mg by mouth in the morning.   Yes [provider]  Probiotic Product (PROBIOTIC PO) Take 1 capsule by mouth daily.   Yes [provider]  QUEtiapine (SEROQUEL) 300 MG tablet Take 300 mg by mouth at bedtime. 05/24/22  Yes [provider]  sertraline (ZOLOFT) 100 MG tablet Take 100 mg by mouth daily.   Yes [provider]  sucralfate (CARAFATE) 1 g tablet Take 1 g by mouth See admin instructions. Take 1  gram by mouth in the morning and before supper   Yes [provider]  tiotropium (SPIRIVA) 18 MCG inhalation capsule Place 1 capsule (18 mcg total) into inhaler and inhale daily. 06/02/22  Yes Kathlen Mody, MD  traZODone (DESYREL) 50 MG tablet Take 1 tablet (50 mg total) by mouth at bedtime as needed for sleep. Patient taking differently: Take 50 mg by mouth at bedtime. 06/01/22 06/01/23 Yes Kathlen Mody, MD  EMGALITY 120 MG/ML SOSY Inject into the skin every 30 (thirty) days. 01/01/22   [provider]  famotidine (PEPCID) 10 MG tablet Take 1 tablet (10 mg total) by mouth daily. Patient not taking: Reported on 07/15/2022 06/02/22   Kathlen Mody, MD  ferrous sulfate 325 (65 FE) MG tablet Take 325 mg by mouth daily with breakfast. Patient not taking: Reported on 07/30/2022    [provider]  furosemide (LASIX)  20 MG tablet Take 1 tablet (20 mg total) by mouth daily. Patient not taking: Reported on 07/15/2022 06/17/22 06/17/23  Cuthriell, Delorise Royals, PA-C  lidocaine (LIDODERM) 5 % Place 1 patch onto the skin daily. Remove & Discard patch within 12 hours or as directed by MD Patient not taking: Reported on 07/15/2022 04/14/22   Leroy Sea, MD  loperamide (IMODIUM) 2 MG capsule Take 1 capsule (2 mg total) by mouth as needed for diarrhea or loose stools. Patient not taking: Reported on 07/15/2022 04/23/22   Elgergawy, Leana Roe, MD  multivitamin (RENA-VIT) TABS tablet Take 1 tablet by mouth at bedtime. Patient not taking: Reported on 07/15/2022 04/13/22   Leroy Sea, MD  Nutritional Supplements (,FEEDING SUPPLEMENT, PROSOURCE PLUS) liquid Take 30 mLs by mouth 2 (two) times daily between meals. Patient not taking: Reported on 07/15/2022 04/13/22   Leroy Sea, MD  promethazine (PHENERGAN) 25 MG tablet Take 1 tablet (25 mg total) by mouth every 6 (six) hours as needed for nausea or vomiting. Patient not taking: Reported on 07/15/2022 09/03/21   Smitty Cords, DO  sodium  bicarbonate 650 MG tablet Take 1 tablet (650 mg total) by mouth 2 (two) times daily. Patient not taking: Reported on 07/15/2022 04/23/22   Elgergawy, Leana Roe, MD    Physical Exam: Vitals:   07/30/22 2100 07/30/22 2115 07/30/22 2130 07/30/22 2200  BP: 123/68 111/81  108/70  Pulse: 85 94  90  Resp: 17 15  16   Temp:   98.3 F (36.8 C) 98.8 F (37.1 C)  TempSrc:   Oral   SpO2: 90% 97%  100%  Weight:      Height:       General: Alert awake no distress Respiratory exam: Bilateral intravesicular Cardiovascular exam S1-S2 normal Abdomen all quadrants soft all quadrant tender without any rebound.  Mild tenderness Bowel sounds present Extremities warm without edema. Data Reviewed:  Labs on Admission:  Results for orders placed or performed during the hospital encounter of 07/30/22 (from the past 24 hour(s))  CBC with Differential     Status: Abnormal   Collection Time: 07/30/22  3:12 PM  Result Value Ref Range   WBC 9.3 4.0 - 10.5 K/uL   RBC 2.02 (L) 3.87 - 5.11 MIL/uL   Hemoglobin 6.5 (L) 12.0 - 15.0 g/dL   HCT 09.6 (L) 04.5 - 40.9 %   MCV 102.0 (H) 80.0 - 100.0 fL   MCH 32.2 26.0 - 34.0 pg   MCHC 31.6 30.0 - 36.0 g/dL   RDW 81.1 91.4 - 78.2 %   Platelets 371 150 - 400 K/uL   nRBC 0.0 0.0 - 0.2 %   Neutrophils Relative % 62 %   Neutro Abs 5.9 1.7 - 7.7 K/uL   Lymphocytes Relative 27 %   Lymphs Abs 2.5 0.7 - 4.0 K/uL   Monocytes Relative 7 %   Monocytes Absolute 0.6 0.1 - 1.0 K/uL   Eosinophils Relative 3 %   Eosinophils Absolute 0.3 0.0 - 0.5 K/uL   Basophils Relative 1 %   Basophils Absolute 0.1 0.0 - 0.1 K/uL   Immature Granulocytes 0 %   Abs Immature Granulocytes 0.02 0.00 - 0.07 K/uL  Comprehensive metabolic panel     Status: Abnormal   Collection Time: 07/30/22  3:12 PM  Result Value Ref Range   Sodium 136 135 - 145 mmol/L   Potassium 3.7 3.5 - 5.1 mmol/L   Chloride 113 (H) 98 - 111 mmol/L  CO2 18 (L) 22 - 32 mmol/L   Glucose, Bld 104 (H) 70 - 99 mg/dL   BUN 37  (H) 6 - 20 mg/dL   Creatinine, Ser 1.61 (H) 0.44 - 1.00 mg/dL   Calcium 8.7 (L) 8.9 - 10.3 mg/dL   Total Protein 5.9 (L) 6.5 - 8.1 g/dL   Albumin 2.9 (L) 3.5 - 5.0 g/dL   AST 11 (L) 15 - 41 U/L   ALT 9 0 - 44 U/L   Alkaline Phosphatase 62 38 - 126 U/L   Total Bilirubin 0.4 0.3 - 1.2 mg/dL   GFR, Estimated 18 (L) >60 mL/min   Anion gap 5 5 - 15  Type and screen [facility]     Status: None (Preliminary result)   Collection Time: 07/30/22  3:15 PM  Result Value Ref Range   ABO/RH(D) A POS    Antibody Screen NEG    Sample Expiration 08/02/2022,2359    Unit Number [id number]    Blood Component Type RED CELLS,LR    Unit division 00    Status of Unit ISSUED    Transfusion Status OK TO TRANSFUSE    Crossmatch Result      Compatible Performed at [facility], [address] [place], [place] [address]   Urinalysis, Routine w reflex microscopic -Urine, Clean Catch     Status: Abnormal   Collection Time: 07/30/22  3:16 PM  Result Value Ref Range   Color, Urine STRAW (A) YELLOW   APPearance CLEAR (A) CLEAR   Specific Gravity, Urine 1.009 1.005 - 1.030   pH 5.0 5.0 - 8.0   Glucose, UA NEGATIVE NEGATIVE mg/dL   Hgb urine dipstick NEGATIVE NEGATIVE   Bilirubin Urine NEGATIVE NEGATIVE   Ketones, ur NEGATIVE NEGATIVE mg/dL   Protein, ur NEGATIVE NEGATIVE mg/dL   Nitrite NEGATIVE NEGATIVE   Leukocytes,Ua NEGATIVE NEGATIVE  Prepare RBC (crossmatch)     Status: None   Collection Time: 07/30/22  5:24 PM  Result Value Ref Range   Order Confirmation      ORDER PROCESSED BY BLOOD BANK Performed at [facility], [address] [place], [place] [address]    Basic Metabolic Panel: Recent Labs  Lab 07/30/22 1512  NA 136  K 3.7  CL 113*  CO2 18*  GLUCOSE 104*  BUN 37*  CREATININE 2.97*  CALCIUM 8.7*   Liver Function Tests: Recent Labs  Lab 07/30/22 1512  AST 11*  ALT 9  ALKPHOS 62  BILITOT 0.4  PROT 5.9*  ALBUMIN 2.9*   No  results for input(s): "LIPASE", "AMYLASE" in the last 168 hours. No results for input(s): "AMMONIA" in the last 168 hours. CBC: Recent Labs  Lab 07/30/22 1512  WBC 9.3  NEUTROABS 5.9  HGB 6.5*  HCT 20.6*  MCV 102.0*  PLT 371   Cardiac Enzymes: No results for input(s): "CKTOTAL", "CKMB", "CKMBINDEX", "TROPONINIHS" in the last 168 hours.  BNP (last 3 results) No results for input(s): "PROBNP" in the last 8760 hours. CBG: No results for input(s): "GLUCAP" in the last 168 hours.  Radiological Exams on Admission:  CT ABDOMEN PELVIS WO CONTRAST  Result Date: 07/30/2022 CLINICAL DATA:  Abdominal pain. History of colitis. Rectal bleeding. EXAM: CT ABDOMEN AND PELVIS WITHOUT CONTRAST TECHNIQUE: Multidetector CT imaging of the abdomen and pelvis was performed following the standard protocol without IV contrast. RADIATION DOSE REDUCTION: This exam was performed according to the departmental dose-optimization program which includes automated exposure control, adjustment of the mA  and/or kV according to patient size and/or use of iterative reconstruction technique. COMPARISON:  CT abdomen and pelvis 05/28/2022 FINDINGS: Lower chest: No acute abnormality. Hepatobiliary: No focal liver abnormality is seen. Status post cholecystectomy. No biliary dilatation. Pancreas: Unremarkable. Spleen: Unremarkable. Adrenals/Urinary Tract: Stable adrenal glands. Nonobstructing punctate bilateral nephrolithiasis. No hydronephrosis. Unremarkable bladder. Stomach/Bowel: Stomach is within normal limits. Normal caliber large and small bowel. Wall thickening and pericolonic fat stranding about the ascending and transverse colon compatible with colitis. The previously seen wall thickening and stranding about the descending and sigmoid colon has resolved. Normal appendix. Vascular/Lymphatic: Low-attenuation blood pool compatible with anemia. Aortic calcified atherosclerosis. No lymphadenopathy. Reproductive: No acute  abnormality. Other: No organized fluid collection or abscess. No free intraperitoneal air. Musculoskeletal: No acute osseous abnormality. IMPRESSION: 1. Acute colitis involving the ascending and transverse colon. 2. Nonobstructing bilateral nephrolithiasis. 3. Low-attenuation blood pool compatible with anemia. Aortic Atherosclerosis (ICD10-I70.0). Electronically Signed   By: Minerva Fester M.D.   On: 07/30/2022 18:27      Assessment and Plan: * Lower GI bleeding Ongoing intermittent since January 2024.  Patient had a colonoscopy in January 2024 and there is a biopsy report.  I quote "FINAL MICROSCOPIC DIAGNOSIS:   A. COLON, BIOSPY:  -  Scant fragment of granulation tissue consistent ulceration with acute  and chronic inflammation (no mucosal epithelium is present on the biopsy  specimen).  - An immunohistochemical stain for CMV is negative.  -  EBV ISH is negative.  -  An immunohistochemical stain for CK AE1/AE3 is negative (no  epithelium present in the biopsy specimen), negative for malignancy.   Note: There is overall limited tissue for evaluation.  An underlying  etiology for the ulceration/granulation tissue is not identified on the  scant tissue.  The case was discussed with nurse practitioner Merilyn Baba. "  Given that the patient has continued to have lower GI bleeding since then and multiple CAT scan demonstrating colitis, at this time I will check stool panel for C. difficile and GI pathogens as well as ova and parasite smear of stool.  However do favor ultimately a repeat colonoscopy with better biopsy.  Principal concerns being ulcerative colitis or Crohn's disease like illness.  Colitis Check stool pathogen panel and C. difficile.  Pending that, treat empirically with ceftriaxone and metronidazole  Anemia This is most consistent with blood loss from outpatient GI system.  I will check INR PTT.  Patient has already received 1 unit PRBC in the ER.  Check CBC right now.   Given ongoing blood loss I would target a hemoglobin of at least 8 g/dL.      Advance Care Planning:   Code Status: Full Code   Consults: I have sent a staff message to Dr. Ileene Patrick (who did patient colonoscopy in January) to advise if  his service can see patient at [facility] to help with management.  Family Communication: per patient.  Severity of Illness: The appropriate patient status for this patient is INPATIENT. Inpatient status is judged to be reasonable and necessary in order to provide the required intensity of service to ensure the patient's safety. The patient's presenting symptoms, physical exam findings, and initial radiographic and laboratory data in the context of their chronic comorbidities is felt to place them at high risk for further clinical deterioration. Furthermore, it is not anticipated that the patient will be medically stable for discharge from the hospital within 2 midnights of admission.   * I certify that at the  point of admission it is my clinical judgment that the patient will require inpatient hospital care spanning beyond 2 midnights from the point of admission due to high intensity of service, high risk for further deterioration and high frequency of surveillance required.*  Author: Nolberto Hanlon, MD 07/30/2022 11:49 PM  For on call review [email or web address].

## 2022-07-30 NOTE — ED Notes (Signed)
Will start blood after CT

## 2022-07-30 NOTE — H&P (Incomplete)
History and Physical    Patient: Darlene Barber ZOX:[id number] DOB: [birth year] DOA: 07/30/2022 DOS: the patient was seen and examined on 07/30/2022 PCP: Gracelyn Nurse, MD  Patient coming from: {Point_of_Origin:[address]}  Chief Complaint:  Chief Complaint  Patient presents with  . Abnormal Lab  . Rectal Bleeding   HPI: Darlene Barber is a 57 y.o. female with medical history significant of septic shock admission at [facility] in late December 2023.  That hospitalization was complicated by development of lower GI bleed on April 06, 2022 with subsequent colonoscopy showing multiple large ulcers of the large bowel.  6 patient was subsequently discharged in February 2024.  Unfortunately patient has had recurrent episodes of radiologic diagnosis of colitis as well as finding of lower GI bleed since then.  Most recently patient comes in now with about 2 weeks of rather daily lower GI bleed varying from a single episode in a day to 5 bloody stools in a day associated with generalized abdominal cramps.  No vomiting is reported no fever.  Patient was evaluated at office nephrology today and found to have severe anemia and sent to [facility] ER.  There is no report of patient having chest pain shortness of breath presyncope or palpitation.  Review of Systems: As mentioned in the history of present illness. All other systems reviewed and are negative. Past Medical History:  Diagnosis Date  . Allergy   . Anxiety   . Bursitis of both hips   . Depression   . Frequent headaches   . Obesity   . Sleep apnea    doesn't use CPAP machine broken,    Past Surgical History:  Procedure Laterality Date  . BIOPSY  03/30/2022   Procedure: BIOPSY;  Surgeon: Benancio Deeds, MD;  Location: [facility] ENDOSCOPY;  Service: Gastroenterology;;  . CESAREAN SECTION     x2  . CHOLECYSTECTOMY    . COLONOSCOPY WITH PROPOFOL N/A 11/20/2017   Procedure: COLONOSCOPY WITH PROPOFOL;  Surgeon: Toney Reil, MD;   Location: [facility] ENDOSCOPY;  Service: Gastroenterology;  Laterality: N/A;  . COLONOSCOPY WITH PROPOFOL N/A 03/30/2022   Procedure: COLONOSCOPY WITH PROPOFOL;  Surgeon: Benancio Deeds, MD;  Location: [facility] ENDOSCOPY;  Service: Gastroenterology;  Laterality: N/A;  . ESOPHAGOGASTRODUODENOSCOPY (EGD) WITH PROPOFOL N/A 03/30/2022   Procedure: ESOPHAGOGASTRODUODENOSCOPY (EGD) WITH PROPOFOL;  Surgeon: Benancio Deeds, MD;  Location: [facility] ENDOSCOPY;  Service: Gastroenterology;  Laterality: N/A;  . HYSTERECTOMY ABDOMINAL WITH SALPINGECTOMY  1998  . HYSTEROSCOPY     Social History:  reports that she has quit smoking. Her smoking use included cigarettes and cigars. She has a 30.00 pack-year smoking history. She has never used smokeless tobacco. She reports current alcohol use. She reports current drug use. Drug: Marijuana.  Allergies  Allergen Reactions  . Other Anaphylaxis, Itching, Swelling and Other (See Comments)    Black pepper  . Black Pepper-Turmeric   . Latex Rash  . Silicone Rash  . Tape Rash and Other (See Comments)    Paper tape is ok to use.    Family History  Problem Relation Age of Onset  . Hodgkin's lymphoma Mother   . Heart failure Maternal Grandmother   . Alzheimer's disease Maternal Grandfather   . Emphysema Paternal Grandmother   . Diabetes Maternal Aunt   . Diabetes Maternal Uncle   . Cancer Other   . Breast cancer Neg Hx     Prior to Admission medications   Medication Sig Start Date End Date Taking? Authorizing Provider  acyclovir (ZOVIRAX) 400 MG tablet Take 400 mg by mouth 2 (two) times daily.   Yes [provider]  albuterol (VENTOLIN HFA) 108 (90 Base) MCG/ACT inhaler INHALE 2 PUFFS EVERY FOUR HOURS AS NEEDED Patient taking differently: Inhale 2 puffs into the lungs every 4 (four) hours as needed for wheezing or shortness of breath. 10/08/21  Yes Karamalegos, Alexander J, DO  amLODipine (NORVASC) 10 MG tablet Take 10 mg by mouth daily.   Yes [provider]  B Complex-C (SUPER B COMPLEX PO) Take 1 tablet by mouth daily with breakfast.   Yes [provider]  brexpiprazole (REXULTI) 1 MG TABS tablet Take 1 mg by mouth daily.   Yes [provider]  BREYNA 160-4.5 MCG/ACT inhaler Inhale 2 puffs into the lungs 2 (two) times daily.   Yes [provider]  Calcium Carbonate Antacid (ANTACID SOFT CHEWS PO) Take 1 tablet by mouth every 6 (six) hours as needed (for indigesation- CHEW).   Yes [provider]  COLACE 100 MG capsule Take 100 mg by mouth in the morning.   Yes [provider]  Exenatide ER 2 MG PEN Inject 1 Dose into the skin once a week.   Yes [provider]  fluticasone (FLONASE) 50 MCG/ACT nasal spray 1 SPARY IN EACH NOSTRIL DAILY Patient taking differently: Place 1 spray into both nostrils daily. 10/08/21  Yes Karamalegos, Netta Neat, DO  loratadine (CLARITIN) 10 MG tablet Take 10 mg by mouth in the morning and at bedtime.   Yes [provider]  nortriptyline (PAMELOR) 25 MG capsule Take 25-50 mg by mouth See admin instructions. Take 25 mg (1 capsule) by mouth every morning, and 50 mg (2 capsules) every night at bedtime 10/24/20  Yes [provider]  omeprazole (PRILOSEC) 40 MG capsule Take 40 mg by mouth daily before breakfast.   Yes [provider]  ondansetron (ZOFRAN-ODT) 4 MG disintegrating tablet Take 4 mg by mouth every 8 (eight) hours as needed for nausea.   Yes [provider]  pregabalin (LYRICA) 25 MG capsule Take 25 mg by mouth in the morning.   Yes [provider]  Probiotic Product (PROBIOTIC PO) Take 1 capsule by mouth daily.   Yes [provider]  QUEtiapine (SEROQUEL) 300 MG tablet Take 300 mg by mouth at bedtime. 05/24/22  Yes [provider]  sertraline (ZOLOFT) 100 MG tablet Take 100 mg by mouth daily.   Yes [provider]  sucralfate (CARAFATE) 1 g tablet Take 1 g by mouth See admin  instructions. Take 1 gram by mouth in the morning and before supper   Yes [provider]  tiotropium (SPIRIVA) 18 MCG inhalation capsule Place 1 capsule (18 mcg total) into inhaler and inhale daily. 06/02/22  Yes Kathlen Mody, MD  traZODone (DESYREL) 50 MG tablet Take 1 tablet (50 mg total) by mouth at bedtime as needed for sleep. Patient taking differently: Take 50 mg by mouth at bedtime. 06/01/22 06/01/23 Yes Kathlen Mody, MD  EMGALITY 120 MG/ML SOSY Inject into the skin every 30 (thirty) days. 01/01/22   [provider]  famotidine (PEPCID) 10 MG tablet Take 1 tablet (10 mg total) by mouth daily. Patient not taking: Reported on 07/15/2022 06/02/22   Kathlen Mody, MD  ferrous sulfate 325 (65 FE) MG tablet Take 325 mg by mouth daily with breakfast. Patient not taking: Reported on 07/30/2022    [provider]  furosemide (LASIX) 20 MG tablet Take 1 tablet (20  mg total) by mouth daily. Patient not taking: Reported on 07/15/2022 06/17/22 06/17/23  Cuthriell, Delorise Royals, PA-C  lidocaine (LIDODERM) 5 % Place 1 patch onto the skin daily. Remove & Discard patch within 12 hours or as directed by MD Patient not taking: Reported on 07/15/2022 04/14/22   Leroy Sea, MD  loperamide (IMODIUM) 2 MG capsule Take 1 capsule (2 mg total) by mouth as needed for diarrhea or loose stools. Patient not taking: Reported on 07/15/2022 04/23/22   Elgergawy, Leana Roe, MD  multivitamin (RENA-VIT) TABS tablet Take 1 tablet by mouth at bedtime. Patient not taking: Reported on 07/15/2022 04/13/22   Leroy Sea, MD  Nutritional Supplements (,FEEDING SUPPLEMENT, PROSOURCE PLUS) liquid Take 30 mLs by mouth 2 (two) times daily between meals. Patient not taking: Reported on 07/15/2022 04/13/22   Leroy Sea, MD  promethazine (PHENERGAN) 25 MG tablet Take 1 tablet (25 mg total) by mouth every 6 (six) hours as needed for nausea or vomiting. Patient not taking: Reported on 07/15/2022 09/03/21   Smitty Cords, DO  sodium bicarbonate 650 MG tablet Take 1 tablet (650 mg total) by mouth 2 (two) times daily. Patient not taking: Reported on 07/15/2022 04/23/22   Elgergawy, Leana Roe, MD    Physical Exam: Vitals:   07/30/22 2100 07/30/22 2115 07/30/22 2130 07/30/22 2200  BP: 123/68 111/81  108/70  Pulse: 85 94  90  Resp: 17 15  16   Temp:   98.3 F (36.8 C) 98.8 F (37.1 C)  TempSrc:   Oral   SpO2: 90% 97%  100%  Weight:      Height:       General: Alert awake no distress Respiratory exam: Bilateral intravesicular Cardiovascular exam S1-S2 normal Abdomen all quadrants soft all quadrant tender without any rebound.  Mild tenderness Bowel sounds present Extremities warm without edema. Data Reviewed: {Tip this will not be part of the note when signed- Document your independent interpretation of telemetry tracing, EKG, lab, Radiology test or any other diagnostic tests. Add any new diagnostic test ordered today. (Optional):[id number]} Labs on Admission:  Results for orders placed or performed during the hospital encounter of 07/30/22 (from the past 24 hour(s))  CBC with Differential     Status: Abnormal   Collection Time: 07/30/22  3:12 PM  Result Value Ref Range   WBC 9.3 4.0 - 10.5 K/uL   RBC 2.02 (L) 3.87 - 5.11 MIL/uL   Hemoglobin 6.5 (L) 12.0 - 15.0 g/dL   HCT 16.1 (L) 09.6 - 04.5 %   MCV 102.0 (H) 80.0 - 100.0 fL   MCH 32.2 26.0 - 34.0 pg   MCHC 31.6 30.0 - 36.0 g/dL   RDW 40.9 81.1 - 91.4 %   Platelets 371 150 - 400 K/uL   nRBC 0.0 0.0 - 0.2 %   Neutrophils Relative % 62 %   Neutro Abs 5.9 1.7 - 7.7 K/uL   Lymphocytes Relative 27 %   Lymphs Abs 2.5 0.7 - 4.0 K/uL   Monocytes Relative 7 %   Monocytes Absolute 0.6 0.1 - 1.0 K/uL   Eosinophils Relative 3 %   Eosinophils Absolute 0.3 0.0 - 0.5 K/uL   Basophils Relative 1 %   Basophils Absolute 0.1 0.0 - 0.1 K/uL   Immature Granulocytes 0 %   Abs Immature Granulocytes 0.02 0.00 - 0.07 K/uL  Comprehensive metabolic panel      Status: Abnormal   Collection Time: 07/30/22  3:12 PM  Result Value Ref  Range   Sodium 136 135 - 145 mmol/L   Potassium 3.7 3.5 - 5.1 mmol/L   Chloride 113 (H) 98 - 111 mmol/L   CO2 18 (L) 22 - 32 mmol/L   Glucose, Bld 104 (H) 70 - 99 mg/dL   BUN 37 (H) 6 - 20 mg/dL   Creatinine, Ser 1.61 (H) 0.44 - 1.00 mg/dL   Calcium 8.7 (L) 8.9 - 10.3 mg/dL   Total Protein 5.9 (L) 6.5 - 8.1 g/dL   Albumin 2.9 (L) 3.5 - 5.0 g/dL   AST 11 (L) 15 - 41 U/L   ALT 9 0 - 44 U/L   Alkaline Phosphatase 62 38 - 126 U/L   Total Bilirubin 0.4 0.3 - 1.2 mg/dL   GFR, Estimated 18 (L) >60 mL/min   Anion gap 5 5 - 15  Type and screen [facility]     Status: None (Preliminary result)   Collection Time: 07/30/22  3:15 PM  Result Value Ref Range   ABO/RH(D) A POS    Antibody Screen NEG    Sample Expiration 08/02/2022,2359    Unit Number [id number]    Blood Component Type RED CELLS,LR    Unit division 00    Status of Unit ISSUED    Transfusion Status OK TO TRANSFUSE    Crossmatch Result      Compatible Performed at [facility], [address] [place], [place] [address]   Urinalysis, Routine w reflex microscopic -Urine, Clean Catch     Status: Abnormal   Collection Time: 07/30/22  3:16 PM  Result Value Ref Range   Color, Urine STRAW (A) YELLOW   APPearance CLEAR (A) CLEAR   Specific Gravity, Urine 1.009 1.005 - 1.030   pH 5.0 5.0 - 8.0   Glucose, UA NEGATIVE NEGATIVE mg/dL   Hgb urine dipstick NEGATIVE NEGATIVE   Bilirubin Urine NEGATIVE NEGATIVE   Ketones, ur NEGATIVE NEGATIVE mg/dL   Protein, ur NEGATIVE NEGATIVE mg/dL   Nitrite NEGATIVE NEGATIVE   Leukocytes,Ua NEGATIVE NEGATIVE  Prepare RBC (crossmatch)     Status: None   Collection Time: 07/30/22  5:24 PM  Result Value Ref Range   Order Confirmation      ORDER PROCESSED BY BLOOD BANK Performed at [facility], [address] [place], [place] [address]    Basic Metabolic Panel: Recent Labs   Lab 07/30/22 1512  NA 136  K 3.7  CL 113*  CO2 18*  GLUCOSE 104*  BUN 37*  CREATININE 2.97*  CALCIUM 8.7*   Liver Function Tests: Recent Labs  Lab 07/30/22 1512  AST 11*  ALT 9  ALKPHOS 62  BILITOT 0.4  PROT 5.9*  ALBUMIN 2.9*   No results for input(s): "LIPASE", "AMYLASE" in the last 168 hours. No results for input(s): "AMMONIA" in the last 168 hours. CBC: Recent Labs  Lab 07/30/22 1512  WBC 9.3  NEUTROABS 5.9  HGB 6.5*  HCT 20.6*  MCV 102.0*  PLT 371   Cardiac Enzymes: No results for input(s): "CKTOTAL", "CKMB", "CKMBINDEX", "TROPONINIHS" in the last 168 hours.  BNP (last 3 results) No results for input(s): "PROBNP" in the last 8760 hours. CBG: No results for input(s): "GLUCAP" in the last 168 hours.  Radiological Exams on Admission:  CT ABDOMEN PELVIS WO CONTRAST  Result Date: 07/30/2022 CLINICAL DATA:  Abdominal pain. History of colitis. Rectal bleeding. EXAM: CT ABDOMEN AND PELVIS WITHOUT CONTRAST TECHNIQUE: Multidetector CT imaging of the abdomen and pelvis was performed following the  standard protocol without IV contrast. RADIATION DOSE REDUCTION: This exam was performed according to the departmental dose-optimization program which includes automated exposure control, adjustment of the mA and/or kV according to patient size and/or use of iterative reconstruction technique. COMPARISON:  CT abdomen and pelvis 05/28/2022 FINDINGS: Lower chest: No acute abnormality. Hepatobiliary: No focal liver abnormality is seen. Status post cholecystectomy. No biliary dilatation. Pancreas: Unremarkable. Spleen: Unremarkable. Adrenals/Urinary Tract: Stable adrenal glands. Nonobstructing punctate bilateral nephrolithiasis. No hydronephrosis. Unremarkable bladder. Stomach/Bowel: Stomach is within normal limits. Normal caliber large and small bowel. Wall thickening and pericolonic fat stranding about the ascending and transverse colon compatible with colitis. The previously seen  wall thickening and stranding about the descending and sigmoid colon has resolved. Normal appendix. Vascular/Lymphatic: Low-attenuation blood pool compatible with anemia. Aortic calcified atherosclerosis. No lymphadenopathy. Reproductive: No acute abnormality. Other: No organized fluid collection or abscess. No free intraperitoneal air. Musculoskeletal: No acute osseous abnormality. IMPRESSION: 1. Acute colitis involving the ascending and transverse colon. 2. Nonobstructing bilateral nephrolithiasis. 3. Low-attenuation blood pool compatible with anemia. Aortic Atherosclerosis (ICD10-I70.0). Electronically Signed   By: Minerva Fester M.D.   On: 07/30/2022 18:27      Assessment and Plan: * Lower GI bleeding Ongoing intermittent since January 2024.  Patient had a colonoscopy in January 2024 and there is a biopsy report.  I quote "FINAL MICROSCOPIC DIAGNOSIS:   A. COLON, BIOSPY:  -  Scant fragment of granulation tissue consistent ulceration with acute  and chronic inflammation (no mucosal epithelium is present on the biopsy  specimen).  - An immunohistochemical stain for CMV is negative.  -  EBV ISH is negative.  -  An immunohistochemical stain for CK AE1/AE3 is negative (no  epithelium present in the biopsy specimen), negative for malignancy.   Note: There is overall limited tissue for evaluation.  An underlying  etiology for the ulceration/granulation tissue is not identified on the  scant tissue.  The case was discussed with nurse practitioner Merilyn Baba. "  Given that the patient has continued to have lower GI bleeding since then and multiple CAT scan demonstrating colitis, at this time I will check stool panel for C. difficile and GI pathogens as well as ova and parasite smear of stool.  However do favor ultimately a repeat colonoscopy with better biopsy.  Principal concerns being ulcerative colitis or Crohn's disease like illness.  Colitis Check stool pathogen panel and C. difficile.   Pending that, treat empirically with ceftriaxone and metronidazole  Anemia This is most consistent with blood loss from outpatient GI system.  I will check INR PTT.  Patient has already received 1 unit PRBC in the ER.  Check CBC right now.  Given ongoing blood loss I would target a hemoglobin of at least 8 g/dL.      Advance Care Planning:   Code Status: Full Code   Consults: ***  Family Communication: ***  Severity of Illness: {Observation/Inpatient:21159}  Author: Nolberto Hanlon, MD 07/30/2022 11:49 PM  For on call review [email or web address].

## 2022-07-31 ENCOUNTER — Encounter: Payer: Self-pay | Admitting: Internal Medicine

## 2022-07-31 DIAGNOSIS — K529 Noninfective gastroenteritis and colitis, unspecified: Secondary | ICD-10-CM | POA: Diagnosis not present

## 2022-07-31 DIAGNOSIS — D638 Anemia in other chronic diseases classified elsewhere: Secondary | ICD-10-CM

## 2022-07-31 DIAGNOSIS — I10 Essential (primary) hypertension: Secondary | ICD-10-CM

## 2022-07-31 DIAGNOSIS — F3289 Other specified depressive episodes: Secondary | ICD-10-CM

## 2022-07-31 DIAGNOSIS — E872 Acidosis, unspecified: Secondary | ICD-10-CM

## 2022-07-31 DIAGNOSIS — D62 Acute posthemorrhagic anemia: Secondary | ICD-10-CM

## 2022-07-31 DIAGNOSIS — K922 Gastrointestinal hemorrhage, unspecified: Secondary | ICD-10-CM | POA: Diagnosis not present

## 2022-07-31 DIAGNOSIS — N184 Chronic kidney disease, stage 4 (severe): Secondary | ICD-10-CM

## 2022-07-31 DIAGNOSIS — F32A Depression, unspecified: Secondary | ICD-10-CM | POA: Insufficient documentation

## 2022-07-31 DIAGNOSIS — N185 Chronic kidney disease, stage 5: Secondary | ICD-10-CM

## 2022-07-31 LAB — CBC
HCT: 22.8 % — ABNORMAL LOW (ref 36.0–46.0)
HCT: 24.1 % — ABNORMAL LOW (ref 36.0–46.0)
Hemoglobin: 7.2 g/dL — ABNORMAL LOW (ref 12.0–15.0)
Hemoglobin: 7.5 g/dL — ABNORMAL LOW (ref 12.0–15.0)
MCH: 30.9 pg (ref 26.0–34.0)
MCH: 31.2 pg (ref 26.0–34.0)
MCHC: 31.1 g/dL (ref 30.0–36.0)
MCHC: 31.6 g/dL (ref 30.0–36.0)
MCV: 98.7 fL (ref 80.0–100.0)
MCV: 99.2 fL (ref 80.0–100.0)
Platelets: 348 10*3/uL (ref 150–400)
Platelets: 357 10*3/uL (ref 150–400)
RBC: 2.31 MIL/uL — ABNORMAL LOW (ref 3.87–5.11)
RBC: 2.43 MIL/uL — ABNORMAL LOW (ref 3.87–5.11)
RDW: 16.7 % — ABNORMAL HIGH (ref 11.5–15.5)
RDW: 17.3 % — ABNORMAL HIGH (ref 11.5–15.5)
WBC: 6.1 10*3/uL (ref 4.0–10.5)
WBC: 6.6 10*3/uL (ref 4.0–10.5)
nRBC: 0 % (ref 0.0–0.2)
nRBC: 0 % (ref 0.0–0.2)

## 2022-07-31 LAB — TYPE AND SCREEN: Unit division: 0

## 2022-07-31 LAB — BASIC METABOLIC PANEL
Anion gap: 6 (ref 5–15)
BUN: 37 mg/dL — ABNORMAL HIGH (ref 6–20)
CO2: 17 mmol/L — ABNORMAL LOW (ref 22–32)
Calcium: 8.4 mg/dL — ABNORMAL LOW (ref 8.9–10.3)
Chloride: 114 mmol/L — ABNORMAL HIGH (ref 98–111)
Creatinine, Ser: 2.85 mg/dL — ABNORMAL HIGH (ref 0.44–1.00)
GFR, Estimated: 19 mL/min — ABNORMAL LOW (ref >60)
Glucose, Bld: 103 mg/dL — ABNORMAL HIGH (ref 70–99)
Potassium: 3.5 mmol/L (ref 3.5–5.1)
Sodium: 137 mmol/L (ref 135–145)

## 2022-07-31 LAB — PREPARE RBC (CROSSMATCH)

## 2022-07-31 LAB — CBG MONITORING, ED
Glucose-Capillary: 117 mg/dL — ABNORMAL HIGH (ref 70–99)
Glucose-Capillary: 118 mg/dL — ABNORMAL HIGH (ref 70–99)
Glucose-Capillary: 82 mg/dL (ref 70–99)

## 2022-07-31 LAB — BPAM RBC: ISSUE DATE / TIME: 202405211916

## 2022-07-31 LAB — PROTIME-INR
INR: 1.1 (ref 0.8–1.2)
Prothrombin Time: 14 seconds (ref 11.4–15.2)

## 2022-07-31 LAB — GLUCOSE, CAPILLARY
Glucose-Capillary: 118 mg/dL — ABNORMAL HIGH (ref 70–99)
Glucose-Capillary: 113 mg/dL — ABNORMAL HIGH (ref 70–99)

## 2022-07-31 LAB — APTT: aPTT: 37 seconds — ABNORMAL HIGH (ref 24–36)

## 2022-07-31 LAB — HEMOGLOBIN: Hemoglobin: 9.3 g/dL — ABNORMAL LOW (ref 12.0–15.0)

## 2022-07-31 MED ORDER — FLUTICASONE PROPIONATE 50 MCG/ACT NA SUSP
1.0000 | Freq: Every day | NASAL | Status: DC
  Administered 2022-07-31 – 2022-08-06 (×7): 1 via NASAL
  Filled 2022-07-31: qty 16

## 2022-07-31 MED ORDER — SERTRALINE HCL 50 MG PO TABS
100.0000 mg | ORAL_TABLET | Freq: Every day | ORAL | Status: DC
  Administered 2022-07-31 – 2022-08-06 (×6): 100 mg via ORAL
  Filled 2022-07-31 (×6): qty 2

## 2022-07-31 MED ORDER — ACYCLOVIR 200 MG PO CAPS
400.0000 mg | ORAL_CAPSULE | Freq: Two times a day (BID) | ORAL | Status: DC
  Administered 2022-07-31 – 2022-08-06 (×12): 400 mg via ORAL
  Filled 2022-07-31 (×13): qty 2

## 2022-07-31 MED ORDER — POLYETHYLENE GLYCOL 3350 17 GM/SCOOP PO POWD
1.0000 | Freq: Once | ORAL | Status: AC
  Administered 2022-07-31: 255 g via ORAL
  Filled 2022-07-31: qty 255

## 2022-07-31 MED ORDER — QUETIAPINE FUMARATE 300 MG PO TABS
300.0000 mg | ORAL_TABLET | Freq: Every day | ORAL | Status: DC
  Administered 2022-07-31 – 2022-08-05 (×6): 300 mg via ORAL
  Filled 2022-07-31 (×6): qty 1

## 2022-07-31 MED ORDER — SODIUM CHLORIDE 0.9% IV SOLUTION
Freq: Once | INTRAVENOUS | Status: AC
  Filled 2022-07-31: qty 250

## 2022-07-31 MED ORDER — CALCIUM CARBONATE ANTACID 500 MG PO CHEW
500.0000 mg | CHEWABLE_TABLET | Freq: Four times a day (QID) | ORAL | Status: DC | PRN
  Filled 2022-07-31: qty 3

## 2022-07-31 MED ORDER — ACETAMINOPHEN 650 MG RE SUPP: 650.0000 mg | Freq: Four times a day (QID) | RECTAL | Status: DC | PRN

## 2022-07-31 MED ORDER — ALBUTEROL SULFATE (2.5 MG/3ML) 0.083% IN NEBU: 2.5000 mg | INHALATION_SOLUTION | RESPIRATORY_TRACT | Status: DC | PRN

## 2022-07-31 MED ORDER — NORTRIPTYLINE HCL 25 MG PO CAPS
50.0000 mg | ORAL_CAPSULE | Freq: Every day | ORAL | Status: DC
  Administered 2022-07-31 – 2022-08-05 (×6): 50 mg via ORAL
  Filled 2022-07-31 (×6): qty 2

## 2022-07-31 MED ORDER — AMLODIPINE BESYLATE 10 MG PO TABS
10.0000 mg | ORAL_TABLET | Freq: Every day | ORAL | Status: DC
  Administered 2022-07-31 – 2022-08-01 (×2): 10 mg via ORAL
  Filled 2022-07-31: qty 1
  Filled 2022-07-31: qty 2

## 2022-07-31 MED ORDER — TIOTROPIUM BROMIDE MONOHYDRATE 18 MCG IN CAPS
18.0000 ug | ORAL_CAPSULE | Freq: Every day | RESPIRATORY_TRACT | Status: DC
  Administered 2022-07-31 – 2022-08-06 (×6): 18 ug via RESPIRATORY_TRACT
  Filled 2022-07-31 (×2): qty 5

## 2022-07-31 MED ORDER — PREGABALIN 25 MG PO CAPS
25.0000 mg | ORAL_CAPSULE | Freq: Every morning | ORAL | Status: DC
  Administered 2022-07-31 – 2022-08-02 (×3): 25 mg via ORAL
  Filled 2022-07-31 (×3): qty 1

## 2022-07-31 MED ORDER — OXYCODONE HCL 5 MG PO TABS
5.0000 mg | ORAL_TABLET | Freq: Once | ORAL | Status: AC
  Administered 2022-07-31: 5 mg via ORAL
  Filled 2022-07-31: qty 1

## 2022-07-31 MED ORDER — SODIUM CHLORIDE 0.9 % IV SOLN: INTRAVENOUS | Status: DC

## 2022-07-31 MED ORDER — BREXPIPRAZOLE 1 MG PO TABS
1.0000 mg | ORAL_TABLET | Freq: Every day | ORAL | Status: DC
  Administered 2022-07-31 – 2022-08-06 (×6): 1 mg via ORAL
  Filled 2022-07-31 (×7): qty 1

## 2022-07-31 MED ORDER — ACETAMINOPHEN 325 MG PO TABS: 650.0000 mg | ORAL_TABLET | Freq: Four times a day (QID) | ORAL | Status: DC | PRN

## 2022-07-31 MED ORDER — TRAZODONE HCL 50 MG PO TABS
50.0000 mg | ORAL_TABLET | Freq: Every evening | ORAL | Status: DC | PRN
  Administered 2022-08-04 – 2022-08-05 (×2): 50 mg via ORAL
  Filled 2022-07-31 (×2): qty 1

## 2022-07-31 MED ORDER — NORTRIPTYLINE HCL 25 MG PO CAPS
25.0000 mg | ORAL_CAPSULE | Freq: Every morning | ORAL | Status: DC
  Administered 2022-07-31 – 2022-08-06 (×6): 25 mg via ORAL
  Filled 2022-07-31 (×7): qty 1

## 2022-07-31 MED ORDER — MORPHINE SULFATE (PF) 2 MG/ML IV SOLN
1.0000 mg | INTRAVENOUS | Status: DC | PRN
  Administered 2022-08-03 (×3): 1 mg via INTRAVENOUS
  Filled 2022-07-31 (×3): qty 1

## 2022-07-31 MED ORDER — OXYCODONE HCL 5 MG PO TABS
5.0000 mg | ORAL_TABLET | ORAL | Status: DC | PRN
  Administered 2022-07-31 – 2022-08-05 (×8): 5 mg via ORAL
  Filled 2022-07-31 (×8): qty 1

## 2022-07-31 MED ORDER — GALCANEZUMAB-GNLM 120 MG/ML ~~LOC~~ SOSY: PREFILLED_SYRINGE | SUBCUTANEOUS | Status: DC

## 2022-07-31 MED ORDER — EXENATIDE ER 2 MG ~~LOC~~ PEN: 1.0000 | PEN_INJECTOR | SUBCUTANEOUS | Status: DC

## 2022-07-31 MED ORDER — ONDANSETRON HCL 4 MG/2ML IJ SOLN
4.0000 mg | Freq: Four times a day (QID) | INTRAMUSCULAR | Status: DC | PRN
  Administered 2022-07-31 – 2022-08-02 (×3): 4 mg via INTRAVENOUS
  Filled 2022-07-31 (×4): qty 2

## 2022-07-31 MED ORDER — ALPRAZOLAM 0.25 MG PO TABS
0.2500 mg | ORAL_TABLET | Freq: Three times a day (TID) | ORAL | Status: DC | PRN
  Administered 2022-07-31 – 2022-08-05 (×10): 0.25 mg via ORAL
  Filled 2022-07-31 (×10): qty 1

## 2022-07-31 MED ORDER — MOMETASONE FURO-FORMOTEROL FUM 200-5 MCG/ACT IN AERO
2.0000 | INHALATION_SPRAY | Freq: Two times a day (BID) | RESPIRATORY_TRACT | Status: DC
  Administered 2022-07-31 – 2022-08-06 (×13): 2 via RESPIRATORY_TRACT
  Filled 2022-07-31: qty 8.8

## 2022-07-31 MED ORDER — PANTOPRAZOLE SODIUM 40 MG PO TBEC
40.0000 mg | DELAYED_RELEASE_TABLET | Freq: Every day | ORAL | Status: DC
  Administered 2022-07-31 – 2022-08-02 (×3): 40 mg via ORAL
  Filled 2022-07-31 (×3): qty 1

## 2022-07-31 MED ORDER — SODIUM CHLORIDE 0.9% FLUSH
3.0000 mL | Freq: Two times a day (BID) | INTRAVENOUS | Status: DC
  Administered 2022-07-31 – 2022-08-01 (×3): 3 mL via INTRAVENOUS

## 2022-07-31 NOTE — Assessment & Plan Note (Deleted)
This is most consistent with blood loss from outpatient GI system.  I will check INR PTT.  Patient has already received 1 unit PRBC in the ER.  Check CBC right now.  Given ongoing blood loss I would target a hemoglobin of at least 8 g/dL.

## 2022-07-31 NOTE — Assessment & Plan Note (Addendum)
Will discontinue Norvasc with blood pressure being on the lower side.

## 2022-07-31 NOTE — Assessment & Plan Note (Addendum)
Received 3 units of blood on this hospital stay. Prior ferritin 401 going along with anemia of chronic disease.  Last hemoglobin 9.5.

## 2022-07-31 NOTE — Assessment & Plan Note (Addendum)
Colitis seen on CT scan and gave empiric antibiotics initially.  This was ruled out by colonoscopy.  Seeing a stricture instead.  Stool studies negative.

## 2022-07-31 NOTE — Assessment & Plan Note (Addendum)
Likely secondary to chronic kidney disease.  Restart sodium bicarb tablets.

## 2022-07-31 NOTE — Hospital Course (Addendum)
57 y.o. female with medical history significant of septic shock admission at [facility] in late December 2023.  That hospitalization was complicated by development of lower GI bleed on April 06, 2022 with subsequent colonoscopy showing multiple large ulcers of the large bowel.  6 patient was subsequently discharged in February 2024.  Unfortunately patient has had recurrent episodes of radiologic diagnosis of colitis as well as finding of lower GI bleed since then.  Most recently patient comes in now with about 2 weeks of rather daily lower GI bleed varying from a single episode in a day to 5 bloody stools in a day associated with generalized abdominal cramps.  No vomiting is reported no fever.   Patient was evaluated at office nephrology today and found to have severe anemia and sent to [facility] ER.  5/22.  Patient seen this morning was on liquid diet.  Consulted GI for evaluation.  Patient complaining of lower abdominal pain and coming around from her back.  Patient received 1 unit of packed red blood cells yesterday and receiving another unit today.  Stool studies ordered.  Colitis showing on CT scan.  Patient with recent C. difficile colitis few months ago. 5/23. Hb up to 8.7.  Still having abdominal soreness, dry mouth and back pain.  Not many BM with prep, so colonoscopy pushed til tomorrow and re-prep today. 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  Gastroenterology recommended surgical evaluation.  General surgery to remove part of the colon tomorrow. 5/25.  Seen this morning prior to going to the operating room.  Hemoglobin 7.6 with gentle fluids given overnight.  Will give 1 unit of packed red blood cells prior to surgery. 5/26.  Postoperative day 1 for laparoscopic subtotal colectomy.  Replacing IV magnesium.  Physical therapy evaluation.  Currently on clear liquid diet. 5/27.  Postoperative day 2.  General surgery advance diet to full liquid diet for  lunch and soft diet for dinner.  Patient had bowel movements. 5/28.  Postoperative day 3.  Patient with soreness in the abdomen.  Tolerating solid food.  General surgery cleared to go home.  Small prescription of pain medications prescribed.

## 2022-07-31 NOTE — Assessment & Plan Note (Signed)
On Zoloft. °

## 2022-07-31 NOTE — Assessment & Plan Note (Addendum)
Last creatinine 2.61 with a GFR of 21

## 2022-07-31 NOTE — Assessment & Plan Note (Addendum)
S/p 2 units of blood. Last hb 8.9.

## 2022-07-31 NOTE — Consult Note (Signed)
Darlene Repress, MD [address]  [address]  [place], [place] [address]  Main: [phone]  Fax: [phone] Pager: [phone]   Consultation  Referring Provider:     No ref. provider found Primary Care Physician:  Gracelyn Nurse, MD Primary Gastroenterologist: Gentry Fitz        Reason for Consultation: Hematochezia, colitis on CT scan  Date of Admission:  07/30/2022 Date of Consultation:  07/31/2022         HPI:   Darlene Barber is a 57 y.o. female with history of stage IV CKD, bipolar, COPD, obesity, septic shock, streptococcal bacteremia, acute renal failure requiring CRRT, admitted to [facility] in late December 2023, complicated by lower GI bleed in January 2024, colonoscopy revealed multiple large ulcers in the ascending colon, pathology revealed acute and chronic inflammation.  IHC stains were negative for CMV and EBV.  She had positive C. difficile infection in 05/2022, by both antigen and toxin, treated with oral vancomycin.  Since then, patient had several ER visits particularly in month of May for several different reasons, presented to ER yesterday secondary to 2 weeks history of maroon-colored bowel movements, right-sided abdominal pain, labs from 5/20 by her nephrologist revealed hemoglobin of 7, therefore was advised to go to ER.  Patient reports dark maroon-colored stool, ongoing abdominal pain.  Repeat CT without contrast demonstrated thickening and pericolonic fat stranding of the ascending and transverse colon.  Repeat hemoglobin was 6.5, received blood transfusion, hemoglobin responded appropriately.  She is empirically started on ceftriaxone and metronidazole and GI is consulted for further evaluation Patient did not have any bowel movement today, reports mild abdominal discomfort Patient reports history of heavy NSAID use for chronic back pain, was taking several doses of BC powder in the past  NSAIDs: None  Antiplts/Anticoagulants/Anti  thrombotics: None  GI Procedures: Screening colonoscopy 2019 did not reveal any abnormal mucosa EGD and colonoscopy 03/30/2022 Normal upper endoscopy  Colonoscopy revealed multiple ulcers throughout the colon, worst in the ascending colon A. COLON, BIOSPY:  -  Scant fragment of granulation tissue consistent ulceration with acute  and chronic inflammation (no mucosal epithelium is present on the biopsy  specimen).  - An immunohistochemical stain for CMV is negative.  -  EBV ISH is negative.  -  An immunohistochemical stain for CK AE1/AE3 is negative (no  epithelium present in the biopsy specimen), negative for malignancy.    Past Medical History:  Diagnosis Date   Allergy    Anxiety    Bursitis of both hips    Depression    Frequent headaches    Obesity    Sleep apnea    doesn't use CPAP machine broken,     Past Surgical History:  Procedure Laterality Date   BIOPSY  03/30/2022   Procedure: BIOPSY;  Surgeon: Benancio Deeds, MD;  Location: [facility] ENDOSCOPY;  Service: Gastroenterology;;   CESAREAN SECTION     x2   CHOLECYSTECTOMY     COLONOSCOPY WITH PROPOFOL N/A 11/20/2017   Procedure: COLONOSCOPY WITH PROPOFOL;  Surgeon: Toney Reil, MD;  Location[facility] ENDOSCOPY;  Service: Gastroenterology;  Laterality: N/A;   COLONOSCOPY WITH PROPOFOL N/A 03/30/2022   Procedure: COLONOSCOPY WITH PROPOFOL;  Surgeon: Benancio Deeds, MD;  Location: [facility] ENDOSCOPY;  Service: Gastroenterology;  Laterality: N/A;   ESOPHAGOGASTRODUODENOSCOPY (EGD) WITH PROPOFOL N/A 03/30/2022   Procedure: ESOPHAGOGASTRODUODENOSCOPY (EGD) WITH PROPOFOL;  Surgeon: Benancio Deeds, MD;  Location: [facility] ENDOSCOPY;  Service: Gastroenterology;  Laterality:  N/A;   HYSTERECTOMY ABDOMINAL WITH SALPINGECTOMY  1998   HYSTEROSCOPY       Current Facility-Administered Medications:    0.9 %  sodium chloride infusion, , Intravenous, Continuous, Alann Avey, Loel Dubonnet, MD   acetaminophen (TYLENOL) tablet 650 mg, 650  mg, Oral, Q6H PRN **OR** acetaminophen (TYLENOL) suppository 650 mg, 650 mg, Rectal, Q6H PRN, Nolberto Hanlon, MD   acyclovir (ZOVIRAX) 200 MG capsule 400 mg, 400 mg, Oral, BID, Nolberto Hanlon, MD, 400 mg at 07/31/22 1610   albuterol (PROVENTIL) (2.5 MG/3ML) 0.083% nebulizer solution 2.5 mg, 2.5 mg, Inhalation, Q4H PRN, Nolberto Hanlon, MD   ALPRAZolam Prudy Feeler) tablet 0.25 mg, 0.25 mg, Oral, TID PRN, Alford Highland, MD, 0.25 mg at 07/31/22 1311   amLODipine (NORVASC) tablet 10 mg, 10 mg, Oral, Daily, Nolberto Hanlon, MD, 10 mg at 07/31/22 0919   brexpiprazole (REXULTI) tablet 1 mg, 1 mg, Oral, Daily, Nolberto Hanlon, MD, 1 mg at 07/31/22 9604   calcium carbonate (TUMS - dosed in mg elemental calcium) chewable tablet 500 mg, 500 mg, Oral, Q6H PRN, Nolberto Hanlon, MD   cefTRIAXone (ROCEPHIN) 1 g in sodium chloride 0.9 % 100 mL IVPB, 1 g, Intravenous, Q24H, Nolberto Hanlon, MD   fluticasone (FLONASE) 50 MCG/ACT nasal spray 1 spray, 1 spray, Each Nare, Daily, Nolberto Hanlon, MD, 1 spray at 07/31/22 5409   insulin aspart (novoLOG) injection 0-5 Units, 0-5 Units, Subcutaneous, QHS, Nolberto Hanlon, MD   insulin aspart (novoLOG) injection 0-9 Units, 0-9 Units, Subcutaneous, TID WC, Nolberto Hanlon, MD   metroNIDAZOLE (FLAGYL) IVPB 500 mg, 500 mg, Intravenous, Q8H, Nolberto Hanlon, MD, Last Rate: 100 mL/hr at 07/31/22 1632, 500 mg at 07/31/22 1632   mometasone-formoterol (DULERA) 200-5 MCG/ACT inhaler 2 puff, 2 puff, Inhalation, BID, Nolberto Hanlon, MD, 2 puff at 07/31/22 0919   morphine (PF) 2 MG/ML injection 1 mg, 1 mg, Intravenous, Q3H PRN, Renae Gloss, Richard, MD   nortriptyline (PAMELOR) capsule 25 mg, 25 mg, Oral, q morning, Nolberto Hanlon, MD, 25 mg at 07/31/22 8119   nortriptyline (PAMELOR) capsule 50 mg, 50 mg, Oral, QHS, Nolberto Hanlon, MD   oxyCODONE (Oxy IR/ROXICODONE) immediate release tablet 5 mg, 5 mg, Oral, Q4H PRN, Renae Gloss, Richard, MD, 5 mg at 07/31/22 1528   pantoprazole (PROTONIX) EC tablet 40 mg, 40 mg, Oral, Daily, Nolberto Hanlon, MD, 40  mg at 07/31/22 0919   pantoprazole (PROTONIX) injection 40 mg, 40 mg, Intravenous, Q12H, Nolberto Hanlon, MD, 40 mg at 07/31/22 1633   polyethylene glycol powder (GLYCOLAX/MIRALAX) container 255 g, 1 Container, Oral, Once, Louise Victory, Loel Dubonnet, MD   pregabalin (LYRICA) capsule 25 mg, 25 mg, Oral, q AM, Nolberto Hanlon, MD, 25 mg at 07/31/22 1478   QUEtiapine (SEROQUEL) tablet 300 mg, 300 mg, Oral, QHS, Nolberto Hanlon, MD   sertraline (ZOLOFT) tablet 100 mg, 100 mg, Oral, Daily, Nolberto Hanlon, MD, 100 mg at 07/31/22 2956   sodium bicarbonate tablet 650 mg, 650 mg, Oral, TID, Nolberto Hanlon, MD, 650 mg at 07/31/22 1528   sodium chloride flush (NS) 0.9 % injection 3 mL, 3 mL, Intravenous, Q12H, Nolberto Hanlon, MD, 3 mL at 07/31/22 1003   tiotropium (SPIRIVA) inhalation capsule ([facility] use ONLY) 18 mcg, 18 mcg, Inhalation, Daily, Nolberto Hanlon, MD, 18 mcg at 07/31/22 0920   traZODone (DESYREL) tablet 50 mg, 50 mg, Oral, QHS PRN, Nolberto Hanlon, MD   Family History  Problem Relation Age of Onset   Hodgkin's lymphoma Mother    Heart failure Maternal Grandmother    Alzheimer's disease Maternal Grandfather  Emphysema Paternal Grandmother    Diabetes Maternal Aunt    Diabetes Maternal Uncle    Cancer Other    Breast cancer Neg Hx      Social History   Tobacco Use   Smoking status: Former    Packs/day: 1.00    Years: 30.00    Additional pack years: 0.00    Total pack years: 30.00    Types: Cigarettes, Cigars   Smokeless tobacco: Never  Vaping Use   Vaping Use: Some days  Substance Use Topics   Alcohol use: Yes    Comment: occ once per year   Drug use: Yes    Types: Marijuana    Comment: Current use    Allergies as of 07/30/2022 - Review Complete 07/30/2022  Allergen Reaction Noted   Other Anaphylaxis, Itching, Swelling, and Other (See Comments) 07/15/2022   Black pepper-turmeric  07/30/2022   Latex Rash 07/15/2022   Silicone Rash 01/31/2014   Tape Rash and Other (See Comments) 01/31/2014    Review  of Systems:    All systems reviewed and negative except where noted in HPI.   Physical Exam:  Vital signs in last 24 hours: Temp:  [97.9 F (36.6 C)-98.8 F (37.1 C)] 98.1 F (36.7 C) (05/22 1455) Pulse Rate:  [82-108] 92 (05/22 1455) Resp:  [11-24] 15 (05/22 1455) BP: (99-134)/(64-96) 131/96 (05/22 1455) SpO2:  [90 %-100 %] 100 % (05/22 1455) Last BM Date : 07/29/22 General:   Pleasant, cooperative in NAD Head:  Normocephalic and atraumatic. Eyes:   No icterus.   Conjunctiva pale. PERRLA. Ears:  Normal auditory acuity. Neck:  Supple; no masses or thyroidomegaly Lungs: Respirations even and unlabored. Lungs clear to auscultation bilaterally.   No wheezes, crackles, or rhonchi.  Heart:  Regular rate and rhythm;  Without murmur, clicks, rubs or gallops Abdomen:  Soft, nondistended, nontender. Normal bowel sounds. No appreciable masses or hepatomegaly.  No rebound or guarding.  Rectal:  Not performed. Msk:  Symmetrical without gross deformities.  Strength generalized weakness Extremities:  Without edema, cyanosis or clubbing. Neurologic:  Alert and oriented x3;  grossly normal neurologically. Skin:  Intact without significant lesions or rashes. Psych:  Alert and cooperative. Normal affect.  LAB RESULTS:    Latest Ref Rng & Units 07/31/2022    3:12 PM 07/31/2022    4:56 AM 07/31/2022   12:36 AM  CBC  WBC 4.0 - 10.5 K/uL  6.1  6.6   Hemoglobin 12.0 - 15.0 g/dL 9.3  7.2  7.5   Hematocrit 36.0 - 46.0 %  22.8  24.1   Platelets 150 - 400 K/uL  348  357     BMET    Latest Ref Rng & Units 07/31/2022    4:56 AM 07/30/2022    3:12 PM 07/15/2022    4:20 PM  BMP  Glucose 70 - 99 mg/dL 161  096  80   BUN 6 - 20 mg/dL 37  37  32   Creatinine 0.44 - 1.00 mg/dL 0.45  4.09  8.11   Sodium 135 - 145 mmol/L 137  136  139   Potassium 3.5 - 5.1 mmol/L 3.5  3.7  4.9   Chloride 98 - 111 mmol/L 114  113  108   CO2 22 - 32 mmol/L 17  18  24    Calcium 8.9 - 10.3 mg/dL 8.4  8.7  9.1     LFT     Latest Ref Rng & Units 07/30/2022    3:12  PM 06/17/2022    4:06 PM 05/28/2022    2:26 PM  Hepatic Function  Total Protein 6.5 - 8.1 g/dL 5.9  5.8  6.3   Albumin 3.5 - 5.0 g/dL 2.9  2.1  2.3   AST 15 - 41 U/L 11  16  14    ALT 0 - 44 U/L 9  15  12    Alk Phosphatase 38 - 126 U/L 62  203  147   Total Bilirubin 0.3 - 1.2 mg/dL 0.4  0.5  0.5      STUDIES: CT ABDOMEN PELVIS WO CONTRAST  Result Date: 07/30/2022 CLINICAL DATA:  Abdominal pain. History of colitis. Rectal bleeding. EXAM: CT ABDOMEN AND PELVIS WITHOUT CONTRAST TECHNIQUE: Multidetector CT imaging of the abdomen and pelvis was performed following the standard protocol without IV contrast. RADIATION DOSE REDUCTION: This exam was performed according to the departmental dose-optimization program which includes automated exposure control, adjustment of the mA and/or kV according to patient size and/or use of iterative reconstruction technique. COMPARISON:  CT abdomen and pelvis 05/28/2022 FINDINGS: Lower chest: No acute abnormality. Hepatobiliary: No focal liver abnormality is seen. Status post cholecystectomy. No biliary dilatation. Pancreas: Unremarkable. Spleen: Unremarkable. Adrenals/Urinary Tract: Stable adrenal glands. Nonobstructing punctate bilateral nephrolithiasis. No hydronephrosis. Unremarkable bladder. Stomach/Bowel: Stomach is within normal limits. Normal caliber large and small bowel. Wall thickening and pericolonic fat stranding about the ascending and transverse colon compatible with colitis. The previously seen wall thickening and stranding about the descending and sigmoid colon has resolved. Normal appendix. Vascular/Lymphatic: Low-attenuation blood pool compatible with anemia. Aortic calcified atherosclerosis. No lymphadenopathy. Reproductive: No acute abnormality. Other: No organized fluid collection or abscess. No free intraperitoneal air. Musculoskeletal: No acute osseous abnormality. IMPRESSION: 1. Acute colitis involving the  ascending and transverse colon. 2. Nonobstructing bilateral nephrolithiasis. 3. Low-attenuation blood pool compatible with anemia. Aortic Atherosclerosis (ICD10-I70.0). Electronically Signed   By: Minerva Fester M.D.   On: 07/30/2022 18:27      Impression / Plan:   CHASIDI WEBB is a 57 y.o. female with history of bipolar, obesity, COPD, history of septic shock, past history of E. coli UTI, streptococcal bacteremia, right-sided colitis of unclear etiology, acute renal failure resulting in CKD since 02/2022 is admitted with worsening anemia, hematochezia and ongoing right-sided abdominal pain, CT abdomen and pelvis without contrast revealed persistent right-sided colitis  Hematochezia and colitis of unclear etiology since 02/2022 Differentials include ischemic vs inflammatory bowel disease vs NSAID induced Continue empiric antibiotics Stool for C. difficile pending, prior history of C. difficile infection in 05/2022 treated with 10 days course of oral vancomycin Continue clear liquid diet Patient will need repeat colonoscopy with TI evaluation and segmental biopsies to evaluate for underlying chronicity and if patient has inflammatory bowel disease which will determine further management of colitis Tentative plan to proceed with colonoscopy tomorrow MiraLAX bowel prep ordered N.p.o. effective 5 AM tomorrow Monitor CBC closely, maintain hemoglobin above 7 Anemia likely combination of acute blood loss and from underlying CKD  I have discussed alternative options, risks & benefits,  which include, but are not limited to, bleeding, infection, perforation,respiratory complication & drug reaction.  The patient agrees with this plan & written consent will be obtained.     Thank you for involving me in the care of this patient.      LOS: 1 day   Lannette Donath, MD  07/31/2022, 6:14 PM    Note: This dictation was prepared with Dragon dictation along with smaller phrase technology. Any  transcriptional errors that result from this process are unintentional.

## 2022-07-31 NOTE — Progress Notes (Signed)
Progress Note   Patient: Darlene Barber AOZ:[id number] DOB: [birth year] DOA: 07/30/2022     1 DOS: the patient was seen and examined on 07/31/2022   Brief hospital course:  57 y.o. female with medical history significant of septic shock admission at [facility] in late December 2023.  That hospitalization was complicated by development of lower GI bleed on April 06, 2022 with subsequent colonoscopy showing multiple large ulcers of the large bowel.  6 patient was subsequently discharged in February 2024.  Unfortunately patient has had recurrent episodes of radiologic diagnosis of colitis as well as finding of lower GI bleed since then.  Most recently patient comes in now with about 2 weeks of rather daily lower GI bleed varying from a single episode in a day to 5 bloody stools in a day associated with generalized abdominal cramps.  No vomiting is reported no fever.   Patient was evaluated at office nephrology today and found to have severe anemia and sent to [facility] ER.  5/22.  Patient seen this morning was on liquid diet.  Consulted GI for evaluation.  Patient complaining of lower abdominal pain and coming around from her back.  Patient received 1 unit of packed red blood cells yesterday and receiving another unit today.  Stool studies ordered.  Colitis showing on CT scan.  Patient with recent C. difficile colitis few months ago.  Assessment and Plan: * Colitis Recent C. difficile colitis a few months ago.  Stool studies ordered.  Lower GI bleeding Previous colonoscopy with biopsy shows acute on chronic inflammation.  Will get GI consultation.  Receiving second unit of packed red blood cells today.  Anemia of chronic disease Receiving a second unit of packed red blood cells today on a hemoglobin of 7.2.  Prior ferritin 401 going along with anemia of chronic disease.  Metabolic acidosis Likely secondary to chronic kidney disease.  Restart sodium bicarb tablets.  CKD (chronic kidney disease),  stage IV (HCC) Last creatinine 2.85 with a GFR of 19  Essential hypertension Continue Norvasc  Depression On Zoloft.        Subjective: Patient states that she has a burgundy type stools.  Diarrhea.  Complains of abdominal pain wrapping around mostly on the right side but all over her abdomen.  No nausea or vomiting but does have poor appetite.  Received 1 unit of packed red blood cells yesterday and receiving another unit this morning.  Physical Exam: Vitals:   07/31/22 0400 07/31/22 0600 07/31/22 0758 07/31/22 1048  BP: 115/83 99/64 118/85 120/80  Pulse: 82 83 84 (!) 108  Resp: 11 14 17  (!) 24  Temp: 98.8 F (37.1 C)  97.9 F (36.6 C)   TempSrc:   Oral   SpO2: 100% 100% 100% 100%  Weight:      Height:       Physical Exam HENT:     Head: Normocephalic.     Mouth/Throat:     Pharynx: No oropharyngeal exudate.  Eyes:     General: Lids are normal.     Conjunctiva/sclera: Conjunctivae normal.  Cardiovascular:     Rate and Rhythm: Normal rate and regular rhythm.     Heart sounds: Normal heart sounds, S1 normal and S2 normal.  Pulmonary:     Breath sounds: No decreased breath sounds, wheezing, rhonchi or rales.  Abdominal:     Palpations: Abdomen is soft.     Tenderness: There is generalized abdominal tenderness.  Musculoskeletal:     Right lower leg:  No swelling.     Left lower leg: No swelling.  Skin:    General: Skin is warm.     Findings: No rash.  Neurological:     Mental Status: She is alert and oriented to person, place, and time.     Data Reviewed: Creatinine 2.85, CO2 17, hemoglobin 7.2, platelet count 348, prior ferritin 401    Disposition: Status is: Inpatient Remains inpatient appropriate because: Sending off stools for C. difficile on empiric antibiotics for colitis on CT scan.  Receiving a second unit of packed red blood cells today.  Planned Discharge Destination: Home    Time spent: 28 minutes Case discussed with  gastroenterology  Author: Alford Highland, MD 07/31/2022 2:46 PM  For on call review [email or web address].

## 2022-08-01 DIAGNOSIS — E872 Acidosis, unspecified: Secondary | ICD-10-CM | POA: Diagnosis not present

## 2022-08-01 DIAGNOSIS — K529 Noninfective gastroenteritis and colitis, unspecified: Secondary | ICD-10-CM | POA: Diagnosis not present

## 2022-08-01 DIAGNOSIS — K922 Gastrointestinal hemorrhage, unspecified: Secondary | ICD-10-CM | POA: Diagnosis not present

## 2022-08-01 DIAGNOSIS — D638 Anemia in other chronic diseases classified elsewhere: Secondary | ICD-10-CM | POA: Diagnosis not present

## 2022-08-01 LAB — GLUCOSE, CAPILLARY
Glucose-Capillary: 74 mg/dL (ref 70–99)
Glucose-Capillary: 88 mg/dL (ref 70–99)
Glucose-Capillary: 108 mg/dL — ABNORMAL HIGH (ref 70–99)

## 2022-08-01 LAB — C DIFFICILE QUICK SCREEN W PCR REFLEX
C Diff antigen: NEGATIVE
C Diff interpretation: NOT DETECTED
C Diff toxin: NEGATIVE

## 2022-08-01 LAB — GASTROINTESTINAL PANEL BY PCR, STOOL (REPLACES STOOL CULTURE)

## 2022-08-01 LAB — CBC
HCT: 25.9 % — ABNORMAL LOW (ref 36.0–46.0)
Hemoglobin: 8.7 g/dL — ABNORMAL LOW (ref 12.0–15.0)
MCH: 31 pg (ref 26.0–34.0)
MCHC: 33.6 g/dL (ref 30.0–36.0)
MCV: 92.2 fL (ref 80.0–100.0)
Platelets: 361 10*3/uL (ref 150–400)
RBC: 2.81 MIL/uL — ABNORMAL LOW (ref 3.87–5.11)
RDW: 18.2 % — ABNORMAL HIGH (ref 11.5–15.5)
WBC: 5.1 10*3/uL (ref 4.0–10.5)
nRBC: 0 % (ref 0.0–0.2)

## 2022-08-01 LAB — BASIC METABOLIC PANEL
Anion gap: 7 (ref 5–15)
BUN: 30 mg/dL — ABNORMAL HIGH (ref 6–20)
CO2: 17 mmol/L — ABNORMAL LOW (ref 22–32)
Calcium: 8.3 mg/dL — ABNORMAL LOW (ref 8.9–10.3)
Chloride: 112 mmol/L — ABNORMAL HIGH (ref 98–111)
Creatinine, Ser: 2.68 mg/dL — ABNORMAL HIGH (ref 0.44–1.00)
GFR, Estimated: 20 mL/min — ABNORMAL LOW (ref >60)
Glucose, Bld: 108 mg/dL — ABNORMAL HIGH (ref 70–99)
Potassium: 3.1 mmol/L — ABNORMAL LOW (ref 3.5–5.1)
Sodium: 136 mmol/L (ref 135–145)

## 2022-08-01 LAB — TYPE AND SCREEN: ABO/RH(D): A POS

## 2022-08-01 LAB — BPAM RBC
Blood Product Expiration Date: 202405312359
Blood Product Expiration Date: 202406192359
ISSUE DATE / TIME: 202405220748
Unit Type and Rh: 6200

## 2022-08-01 MED ORDER — POTASSIUM CHLORIDE 10 MEQ/100ML IV SOLN
10.0000 meq | INTRAVENOUS | Status: AC
  Administered 2022-08-01 (×2): 10 meq via INTRAVENOUS
  Filled 2022-08-01: qty 100

## 2022-08-01 MED ORDER — POLYETHYLENE GLYCOL 3350 17 GM/SCOOP PO POWD
1.0000 | Freq: Once | ORAL | Status: AC
  Administered 2022-08-01: 255 g via ORAL
  Filled 2022-08-01: qty 255

## 2022-08-01 MED ORDER — POTASSIUM CHLORIDE CRYS ER 20 MEQ PO TBCR
40.0000 meq | EXTENDED_RELEASE_TABLET | Freq: Once | ORAL | Status: AC
  Administered 2022-08-01: 40 meq via ORAL
  Filled 2022-08-01: qty 2

## 2022-08-01 MED ORDER — AMLODIPINE BESYLATE 5 MG PO TABS: 5.0000 mg | ORAL_TABLET | Freq: Every day | ORAL | Status: DC

## 2022-08-01 NOTE — Plan of Care (Signed)

## 2022-08-01 NOTE — Progress Notes (Signed)
Pt  received tap water enema per Dr. Verdis Prime order. Only able to tolerate 300 cc, awaiting results.

## 2022-08-01 NOTE — Progress Notes (Signed)
   08/01/22 1100  Spiritual Encounters  Type of Visit Initial  Referral source Patient request  Reason for visit Routine spiritual support  OnCall Visit Yes   Patient requested Bible

## 2022-08-01 NOTE — Progress Notes (Signed)
Patient finished most of her MiraLAX prep and had a small bowel movement which was brown in color.  Had a tapwater enema as well.  Will defer colonoscopy until tomorrow C. difficile negative Resume clear liquids Recommend another dose of MiraLAX bowel prep N.p.o. effective 5 AM tomorrow  Lannette Donath, MD

## 2022-08-01 NOTE — Progress Notes (Signed)
Progress Note   Patient: Darlene Barber WUJ:[id number] DOB: [birth year] DOA: 07/30/2022     2 DOS: the patient was seen and examined on 08/01/2022   Brief hospital course:  57 y.o. female with medical history significant of septic shock admission at [facility] in late December 2023.  That hospitalization was complicated by development of lower GI bleed on April 06, 2022 with subsequent colonoscopy showing multiple large ulcers of the large bowel.  6 patient was subsequently discharged in February 2024.  Unfortunately patient has had recurrent episodes of radiologic diagnosis of colitis as well as finding of lower GI bleed since then.  Most recently patient comes in now with about 2 weeks of rather daily lower GI bleed varying from a single episode in a day to 5 bloody stools in a day associated with generalized abdominal cramps.  No vomiting is reported no fever.   Patient was evaluated at office nephrology today and found to have severe anemia and sent to [facility] ER.  5/22.  Patient seen this morning was on liquid diet.  Consulted GI for evaluation.  Patient complaining of lower abdominal pain and coming around from her back.  Patient received 1 unit of packed red blood cells yesterday and receiving another unit today.  Stool studies ordered.  Colitis showing on CT scan.  Patient with recent C. difficile colitis few months ago. 5/23. Hb up to 8.7.  Still having abdominal soreness, dry mouth and back pain.  Not many BM with prep, so colonoscopy pushed til tomorrow and re-prep today.  Assessment and Plan: * Colitis C diff negative on empiric rocephin and flagyl. Stool comprehensive panel pending.   Lower GI bleeding Previous colonoscopy with biopsy shows acute on chronic inflammation.  GI to do colonoscopy tomorrow after poor results with prep yesterday. S/p 2 units of blood. Last hb 8.7.  Anemia of chronic disease Received 2 units of blood on this hospital stay. Prior ferritin 401 going along  with anemia of chronic disease.  Metabolic acidosis Likely secondary to chronic kidney disease.  Continue sodium bicarb tablets.  CKD (chronic kidney disease), stage IV (HCC) Last creatinine 2.68 with a GFR of 20  Essential hypertension Continue Norvasc at lower dose secondary to dry mouth  Depression On Zoloft.  Hypokalemia Replaced iv and po today        Subjective: Patient drank almost the entire prep and only had one BM.  Colonoscopy rescheduled for tomorrow.  Hb up to 8.7. Complaints of dry mouth and back pain.  Still having abdominal soreness.  Physical Exam: Vitals:   07/31/22 1455 07/31/22 2045 08/01/22 0459 08/01/22 0731  BP: (!) 131/96 (!) 136/94 112/83 111/71  Pulse: 92  88 86  Resp: 15 18 18 14   Temp: 98.1 F (36.7 C) 98 F (36.7 C) 98.1 F (36.7 C) 98 F (36.7 C)  TempSrc: Oral Oral    SpO2: 100% 93% 97% 100%  Weight:      Height:       Physical Exam HENT:     Head: Normocephalic.     Mouth/Throat:     Pharynx: No oropharyngeal exudate.  Eyes:     General: Lids are normal.     Conjunctiva/sclera: Conjunctivae normal.  Cardiovascular:     Rate and Rhythm: Normal rate and regular rhythm.     Heart sounds: Normal heart sounds, S1 normal and S2 normal.  Pulmonary:     Breath sounds: No decreased breath sounds, wheezing, rhonchi or rales.  Abdominal:  Palpations: Abdomen is soft.     Tenderness: There is generalized abdominal tenderness.  Musculoskeletal:     Right lower leg: No swelling.     Left lower leg: No swelling.  Skin:    General: Skin is warm.     Findings: No rash.  Neurological:     Mental Status: She is alert and oriented to person, place, and time.     Data Reviewed: Hb 8.7, co2 17 cr 268, k 3.1   Disposition: Status is: Inpatient Remains inpatient appropriate because: colonoscopy tomorrow  Planned Discharge Destination: Home    Time spent: 28 minutes  Author: Alford Highland, MD 08/01/2022 2:16 PM  For on call  review [email or web address].

## 2022-08-01 NOTE — Assessment & Plan Note (Addendum)
Replaced. °

## 2022-08-02 ENCOUNTER — Inpatient Hospital Stay: Payer: Medicaid Other | Admitting: Anesthesiology

## 2022-08-02 ENCOUNTER — Encounter
Admission: EM | Disposition: A | Discharge status: Home Health | Payer: Self-pay | Source: Home / Self Care | Attending: Internal Medicine

## 2022-08-02 ENCOUNTER — Encounter: Payer: Self-pay | Admitting: Internal Medicine

## 2022-08-02 DIAGNOSIS — E876 Hypokalemia: Secondary | ICD-10-CM

## 2022-08-02 DIAGNOSIS — K922 Gastrointestinal hemorrhage, unspecified: Secondary | ICD-10-CM | POA: Diagnosis not present

## 2022-08-02 DIAGNOSIS — K56699 Other intestinal obstruction unspecified as to partial versus complete obstruction: Secondary | ICD-10-CM | POA: Diagnosis not present

## 2022-08-02 DIAGNOSIS — E872 Acidosis, unspecified: Secondary | ICD-10-CM | POA: Diagnosis not present

## 2022-08-02 DIAGNOSIS — D638 Anemia in other chronic diseases classified elsewhere: Secondary | ICD-10-CM | POA: Diagnosis not present

## 2022-08-02 DIAGNOSIS — D62 Acute posthemorrhagic anemia: Secondary | ICD-10-CM | POA: Diagnosis not present

## 2022-08-02 HISTORY — PX: COLONOSCOPY WITH PROPOFOL: SHX5780

## 2022-08-02 LAB — CBC
HCT: 26.1 % — ABNORMAL LOW (ref 36.0–46.0)
Hemoglobin: 8.9 g/dL — ABNORMAL LOW (ref 12.0–15.0)
MCH: 31.1 pg (ref 26.0–34.0)
MCHC: 34.1 g/dL (ref 30.0–36.0)
MCV: 91.3 fL (ref 80.0–100.0)
Platelets: 390 10*3/uL (ref 150–400)
RBC: 2.86 MIL/uL — ABNORMAL LOW (ref 3.87–5.11)
RDW: 17.6 % — ABNORMAL HIGH (ref 11.5–15.5)
WBC: 4.4 10*3/uL (ref 4.0–10.5)
nRBC: 0.5 % — ABNORMAL HIGH (ref 0.0–0.2)

## 2022-08-02 LAB — BASIC METABOLIC PANEL
Anion gap: 6 (ref 5–15)
BUN: 24 mg/dL — ABNORMAL HIGH (ref 6–20)
CO2: 20 mmol/L — ABNORMAL LOW (ref 22–32)
Calcium: 8.4 mg/dL — ABNORMAL LOW (ref 8.9–10.3)
Chloride: 112 mmol/L — ABNORMAL HIGH (ref 98–111)
Creatinine, Ser: 2.64 mg/dL — ABNORMAL HIGH (ref 0.44–1.00)
GFR, Estimated: 21 mL/min — ABNORMAL LOW (ref >60)
Glucose, Bld: 89 mg/dL (ref 70–99)
Potassium: 3.5 mmol/L (ref 3.5–5.1)
Sodium: 138 mmol/L (ref 135–145)

## 2022-08-02 SURGERY — COLONOSCOPY WITH PROPOFOL
Anesthesia: General

## 2022-08-02 MED ORDER — DEXMEDETOMIDINE HCL IN NACL 80 MCG/20ML IV SOLN
INTRAVENOUS | Status: DC | PRN
  Administered 2022-08-02: 8 ug via INTRAVENOUS

## 2022-08-02 MED ORDER — NEOMYCIN SULFATE 500 MG PO TABS
1000.0000 mg | ORAL_TABLET | Freq: Three times a day (TID) | ORAL | Status: AC
  Administered 2022-08-02 (×3): 1000 mg via ORAL
  Filled 2022-08-02 (×3): qty 2

## 2022-08-02 MED ORDER — MIDAZOLAM HCL 2 MG/2ML IJ SOLN
INTRAMUSCULAR | Status: DC | PRN
  Administered 2022-08-02 (×2): 1 mg via INTRAVENOUS

## 2022-08-02 MED ORDER — MIDAZOLAM HCL 2 MG/2ML IJ SOLN
INTRAMUSCULAR | Status: AC
  Filled 2022-08-02: qty 2

## 2022-08-02 MED ORDER — PROPOFOL 500 MG/50ML IV EMUL
INTRAVENOUS | Status: DC | PRN
  Administered 2022-08-02: 50 ug/kg/min via INTRAVENOUS

## 2022-08-02 MED ORDER — CHLORHEXIDINE GLUCONATE CLOTH 2 % EX PADS
6.0000 | MEDICATED_PAD | Freq: Every day | CUTANEOUS | Status: DC
  Administered 2022-08-03 – 2022-08-04 (×2): 6 via TOPICAL

## 2022-08-02 MED ORDER — METRONIDAZOLE 500 MG PO TABS
500.0000 mg | ORAL_TABLET | Freq: Three times a day (TID) | ORAL | Status: AC
  Administered 2022-08-02 (×3): 500 mg via ORAL
  Filled 2022-08-02 (×3): qty 1

## 2022-08-02 MED ORDER — POTASSIUM CHLORIDE CRYS ER 20 MEQ PO TBCR
40.0000 meq | EXTENDED_RELEASE_TABLET | Freq: Once | ORAL | Status: AC
  Administered 2022-08-02: 40 meq via ORAL
  Filled 2022-08-02: qty 2

## 2022-08-02 MED ORDER — PROPOFOL 10 MG/ML IV BOLUS
INTRAVENOUS | Status: DC | PRN
  Administered 2022-08-02: 40 mg via INTRAVENOUS

## 2022-08-02 MED ORDER — LIDOCAINE HCL (CARDIAC) PF 100 MG/5ML IV SOSY
PREFILLED_SYRINGE | INTRAVENOUS | Status: DC | PRN
  Administered 2022-08-02: 60 mg via INTRAVENOUS

## 2022-08-02 MED ORDER — METRONIDAZOLE 500 MG/100ML IV SOLN: 500.0000 mg | Freq: Two times a day (BID) | INTRAVENOUS | Status: DC

## 2022-08-02 MED ORDER — SODIUM CHLORIDE 0.9% IV SOLUTION: Freq: Once | INTRAVENOUS | Status: DC

## 2022-08-02 NOTE — Assessment & Plan Note (Signed)
Operating room today for stricture of the descending colon.  General surgical team started neomycin and oral Flagyl.

## 2022-08-02 NOTE — Progress Notes (Signed)
Progress Note   Patient: Darlene Barber MVH:[id number] DOB: January 23, 1966 DOA: 07/30/2022     3 DOS: the patient was seen and examined on 08/02/2022   Brief hospital course:  57 y.o. female with medical history significant of septic shock admission at [facility] in late December 2023.  That hospitalization was complicated by development of lower GI bleed on April 06, 2022 with subsequent colonoscopy showing multiple large ulcers of the large bowel.  6 patient was subsequently discharged in February 2024.  Unfortunately patient has had recurrent episodes of radiologic diagnosis of colitis as well as finding of lower GI bleed since then.  Most recently patient comes in now with about 2 weeks of rather daily lower GI bleed varying from a single episode in a day to 5 bloody stools in a day associated with generalized abdominal cramps.  No vomiting is reported no fever.   Patient was evaluated at office nephrology today and found to have severe anemia and sent to [facility] ER.  5/22.  Patient seen this morning was on liquid diet.  Consulted GI for evaluation.  Patient complaining of lower abdominal pain and coming around from her back.  Patient received 1 unit of packed red blood cells yesterday and receiving another unit today.  Stool studies ordered.  Colitis showing on CT scan.  Patient with recent C. difficile colitis few months ago. 5/23. Hb up to 8.7.  Still having abdominal soreness, dry mouth and back pain.  Not many BM with prep, so colonoscopy pushed til tomorrow and re-prep today. 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  Gastroenterology recommended surgical evaluation.  General surgery to remove part of the colon tomorrow.  Assessment and Plan: * Stricture of ascending colon [facility]) Gastroenterology recommended surgical evaluation.  Seen by the surgical team and placed on the operating room schedule for tomorrow.  Cervical trauma team started neomycin  and oral Flagyl.  Colitis Colitis ruled out by colonoscopy.  Seeing a stricture instead.  Stool studies negative.   Lower GI bleeding S/p 2 units of blood. Last hb 8.9.  Anemia of chronic disease Received 2 units of blood on this hospital stay. Prior ferritin 401 going along with anemia of chronic disease.  Metabolic acidosis Likely secondary to chronic kidney disease.  Continue sodium bicarb tablets.  CKD (chronic kidney disease), stage IV (HCC) Last creatinine 2.64 with a GFR of 21  Essential hypertension Will discontinue Norvasc with blood pressure being on the lower side.  Depression On Zoloft.  Hypokalemia Replace orally        Subjective: Patient feeling a little less abdominal pain.  Still feels a little sore.  Had bowel movements with the colon prep.  Colonoscopy showing a severe stricture and GI recommended surgical evaluation.  Seen by general surgery and they will put on the OR schedule for tomorrow.  Physical Exam: Vitals:   08/02/22 1122 08/02/22 1129 08/02/22 1132 08/02/22 1142  BP: (!) 85/65 98/75 102/75 104/80  Pulse: 85 93 90   Resp: (!) 29 20 (!) 26   Temp:      TempSrc:      SpO2: 99% 100% 99% 98%  Weight:      Height:       Physical Exam HENT:     Head: Normocephalic.     Mouth/Throat:     Pharynx: No oropharyngeal exudate.  Eyes:     General: Lids are normal.     Conjunctiva/sclera: Conjunctivae normal.  Cardiovascular:     Rate and Rhythm: Normal rate and regular rhythm.     Heart sounds: Normal heart sounds, S1 normal and S2 normal.  Pulmonary:     Breath sounds: No decreased breath sounds, wheezing, rhonchi or rales.  Abdominal:     Palpations: Abdomen is soft.     Tenderness: There is generalized abdominal tenderness.  Musculoskeletal:     Right lower leg: No swelling.     Left lower leg: No swelling.  Skin:    General: Skin is warm.     Findings: No rash.  Neurological:     Mental Status: She is alert and oriented to  person, place, and time.     Data Reviewed: CO2 20, creatinine 2.64, potassium 3.5, hemoglobin 8.9  Disposition: Status is: Inpatient Remains inpatient appropriate because: Colonic stricture seen on colonoscopy.  Patient to go to the OR tomorrow.  Planned Discharge Destination: Home    Time spent: 28 minutes  Author: Alford Highland, MD 08/02/2022 1:28 PM  For on call review [email or web address].

## 2022-08-02 NOTE — Transfer of Care (Signed)
Immediate Anesthesia Transfer of Care Note  Patient: Darlene Barber  Procedure(s) Performed: COLONOSCOPY WITH PROPOFOL  Patient Location: PACU  Anesthesia Type:MAC  Level of Consciousness: sedated  Airway & Oxygen Therapy: Patient Spontanous Breathing and Patient connected to nasal cannula oxygen  Post-op Assessment: Report given to RN and Post -op Vital signs reviewed and stable  Post vital signs: Reviewed and stable  Last Vitals:  Vitals Value Taken Time  BP 86/60 08/02/22 1112  Temp 36 C 08/02/22 1112  Pulse 79 08/02/22 1114  Resp 14 08/02/22 1114  SpO2 99 % 08/02/22 1114  Vitals shown include unvalidated device data.  Last Pain:  Vitals:   08/02/22 1112  TempSrc: Temporal  PainSc: Asleep      Patients Stated Pain Goal: 0 (07/31/22 1510)  Complications: No notable events documented.

## 2022-08-02 NOTE — Anesthesia Preprocedure Evaluation (Signed)
Anesthesia Evaluation  Patient identified by MRN, date of birth, ID band Patient awake    Reviewed: Allergy & Precautions, NPO status , [name] Chart, lab work & pertinent test results  Airway Mallampati: II  TM Distance: <3 FB Neck ROM: full    Dental  (+) Upper Dentures, Lower Dentures   Pulmonary neg pulmonary ROS, sleep apnea , COPD, Patient abstained from smoking., former smoker   Pulmonary exam normal  + decreased breath sounds      Cardiovascular Exercise Tolerance: Poor hypertension, negative cardio ROS Normal cardiovascular exam Rhythm:Regular Rate:Normal     Neuro/Psych  Headaches    Bipolar Disorder   negative neurological ROS  negative psych ROS   GI/Hepatic negative GI ROS, Neg liver ROS, PUD,GERD  Medicated,,  Endo/Other  negative endocrine ROSdiabetes, Type 2  Morbid obesity  Renal/GU negative Renal ROS  negative genitourinary   Musculoskeletal  (+) Arthritis ,    Abdominal  (+) + obese  Peds negative pediatric ROS (+)  Hematology negative hematology ROS (+) Blood dyscrasia, anemia   Anesthesia Other Findings Past Medical History: No date: Allergy No date: Anxiety No date: Bursitis of both hips No date: Depression No date: Frequent headaches No date: Obesity No date: Sleep apnea     Comment:  doesn't use CPAP machine broken,   Past Surgical History: 03/30/2022: BIOPSY     Comment:  Procedure: BIOPSY;  Surgeon: Benancio Deeds, MD;                Location: [facility];  Service: Gastroenterology;; No date: CESAREAN SECTION     Comment:  x2 No date: CHOLECYSTECTOMY 11/20/2017: COLONOSCOPY WITH PROPOFOL; N/A     Comment:  Procedure: COLONOSCOPY WITH PROPOFOL;  Surgeon: Toney Reil, MD;  Location: [facility] ENDOSCOPY;  Service:               Gastroenterology;  Laterality: N/A; 03/30/2022: COLONOSCOPY WITH PROPOFOL; N/A     Comment:  Procedure: COLONOSCOPY WITH PROPOFOL;   Surgeon:               Benancio Deeds, MD;  Location: [facility] ENDOSCOPY;                Service: Gastroenterology;  Laterality: N/A; 03/30/2022: ESOPHAGOGASTRODUODENOSCOPY (EGD) WITH PROPOFOL; N/A     Comment:  Procedure: ESOPHAGOGASTRODUODENOSCOPY (EGD) WITH               PROPOFOL;  Surgeon: Benancio Deeds, MD;  Location:               [facility];  Service: Gastroenterology;  Laterality:               N/A; 1998: HYSTERECTOMY ABDOMINAL WITH SALPINGECTOMY No date: HYSTEROSCOPY  BMI    Body Mass Index: 28.63 kg/m      Reproductive/Obstetrics negative OB ROS                             Anesthesia Physical Anesthesia Plan  ASA: 3  Anesthesia Plan: General   Post-op Pain Management:    Induction: Intravenous  PONV Risk Score and Plan: Propofol infusion and TIVA  Airway Management Planned: Natural Airway  Additional Equipment:   Intra-op Plan:   Post-operative Plan:   Informed Consent: I have reviewed the patients History and Physical, chart, labs and discussed the procedure including the risks, benefits  and alternatives for the proposed anesthesia with the patient or authorized representative who has indicated his/her understanding and acceptance.     Dental Advisory Given  Plan Discussed with: CRNA and Surgeon  Anesthesia Plan Comments:        Anesthesia Quick Evaluation

## 2022-08-02 NOTE — TOC Initial Note (Signed)
Transition of Care [facility]) - Initial/Assessment Note    Patient Details  Name: Darlene Barber MRN: [id number] Date of Birth: Aug 18, 1965  Transition of Care [facility] CM/SW Contact:    Liliana Cline, LCSW Phone Number: 08/02/2022, 2:07 PM  Clinical Narrative:                 CSW completed high risk assessment with patient. Patient is from home with her adult son. Sons or friend provide transportation. PCP is Dr. Letitia Libra. Patient has a RW, BSC, and shower seat at home. Patient reported she went to [facility] in February for one week. Patient had [facility] in the past, patient reported she is not currently active with HH. Patient requests a cane. MD ordered cane. Referral made to [facility] with [employer] for cane to be delivered to patient's bedside.   Expected Discharge Plan: Home/Self Care Barriers to Discharge: Continued Medical Work up   Patient Goals and CMS Choice Patient states their goals for this hospitalization and ongoing recovery are:: home with adult son CMS Medicare.gov Compare Post Acute Care list provided to:: Patient Choice offered to / list presented to : Patient      Expected Discharge Plan and Services       Living arrangements for the past 2 months: Single Family Home                 DME Arranged: Gilmer Mor DME Agency: [employer] Date DME Agency Contacted: 08/02/22   Representative spoke with at DME Agency: Barbara Cower            Prior Living Arrangements/Services Living arrangements for the past 2 months: Single Family Home Lives with:: Adult Children Patient language and need for interpreter reviewed:: Yes Do you feel safe going back to the place where you live?: Yes      Need for Family Participation in Patient Care: Yes (Comment) Care giver support system in place?: Yes (comment) Current home services: DME Criminal Activity/Legal Involvement Pertinent to Current Situation/Hospitalization: No - Comment as needed  Activities of Daily Living Home  Assistive Devices/Equipment: None ADL Screening (condition at time of admission) [name] cognitive ability adequate to safely complete daily activities?: Yes Is the patient deaf or have difficulty hearing?: No Does the patient have difficulty seeing, even when wearing glasses/contacts?: No Does the patient have difficulty concentrating, remembering, or making decisions?: No Patient able to express need for assistance with ADLs?: Yes Does the patient have difficulty dressing or bathing?: No Independently performs ADLs?: Yes (appropriate for developmental age) Does the patient have difficulty walking or climbing stairs?: No Weakness of Legs: Both Weakness of Arms/Hands: None  Permission Sought/Granted Permission sought to share information with : Facility Industrial/product designer granted to share information with : Yes, Verbal Permission Granted     Permission granted to share info w AGENCY: DME        Emotional Assessment       Orientation: : Oriented to Self, Oriented to Situation, Oriented to Place, Oriented to  Time Alcohol / Substance Use: Not Applicable Psych Involvement: No (comment)  Admission diagnosis:  Acute blood loss anemia [D62] Colitis [K52.9] Lower GI bleeding [K92.2] Acute GI bleeding [K92.2] Patient Active Problem List   Diagnosis Date Noted   Stricture of ascending colon (HCC) 08/02/2022   Anemia of chronic disease 07/31/2022   Metabolic acidosis 07/31/2022   CKD (chronic kidney disease), stage IV (HCC) 07/31/2022   Depression 07/31/2022   Acute GI bleeding 07/31/2022   Acute blood  loss anemia 07/31/2022   Lower GI bleeding 07/30/2022   Severe episode of recurrent major depressive disorder, with psychotic features (HCC) 07/15/2022   Malnutrition of moderate degree 05/29/2022   SIRS (systemic inflammatory response syndrome) (HCC) 05/28/2022   Hypomagnesemia 05/28/2022   Acute on chronic anemia 05/28/2022   Hypokalemia 05/28/2022   Sinus  tachycardia 05/28/2022   Diarrhea 05/28/2022   Colitis 04/03/2022   Ulceration, colon 03/31/2022   Anemia, posthemorrhagic, acute 03/29/2022   Hematochezia 03/28/2022   Acute encephalopathy 03/22/2022   Bacteremia 03/15/2022   Rash 03/15/2022   Acute respiratory failure with hypoxia (HCC) 03/15/2022   AKI (acute kidney injury) (HCC) 03/12/2022   Septic shock (HCC) 03/10/2022   Balance disorder 06/29/2020   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   Centrilobular emphysema (HCC) 05/31/2019   Chronic upper extremity pain (Secondary Area of Pain) (Bilateral) (L>R) 03/16/2018   Primary osteoarthritis involving multiple joints 03/16/2018   Vitamin D deficiency 03/16/2018   Cervicogenic headache 03/16/2018   Cervical facet hypertrophy 03/16/2018   Cervical facet syndrome (Bilateral) (L>R) 03/16/2018   Cervical foraminal stenosis (C4-C7) (Bilateral) (L>R) 03/16/2018   Cervical central spinal stenosis (C5-6) 03/16/2018   Cervical spondylitis w/ radiculitis (HCC) 03/16/2018   Cervical radiculitis (Bilateral) (L>R) 03/16/2018   Osteoarthritis of hips (Bilateral) 03/16/2018   DDD (degenerative disc disease), cervical 03/16/2018   Marijuana use 03/16/2018   Migraines 02/12/2018   Seasonal allergies 02/12/2018   Chronic neck pain (Primary Area of Pain) (Bilateral) (L>R) 02/12/2018   Chronic hip pain (Tertiary Area of Pain) (Bilateral) (R>L) 02/12/2018   Chronic lower extremity pain (Fourth Area of Pain) (Right) 02/12/2018   Chronic pain syndrome 02/12/2018   Opiate use 02/12/2018   Pharmacologic therapy 02/12/2018   Disorder of skeletal system 02/12/2018   Problems influencing health status 02/12/2018   Hyperlipidemia associated with type 2 diabetes mellitus (HCC) 11/20/2017   Encounter for screening colonoscopy    Cervicalgia 10/17/2017   Tremor 09/16/2017   Drug-induced tremor 12/16/2016   Vasomotor flushing 09/10/2016   Post menopausal syndrome 08/21/2016   Controlled type 2  diabetes mellitus with complication, without long-term current use of insulin (HCC) 08/21/2016   Bipolar depression (HCC) 08/20/2016   Insomnia 08/20/2016   Essential hypertension 08/20/2016   Tobacco abuse 08/20/2016   Right leg swelling 01/30/2016   Trochanteric bursitis of hip (Right) 01/30/2016   Numbness and tingling 11/15/2014   Obesity (BMI 30.0-34.9) 11/15/2014   OSA on CPAP 11/15/2014   Abdominal bloating 12/06/2013   Mixed incontinence urge and stress 08/22/2012   Atrophy of vagina 08/22/2012   Dyspareunia 08/22/2012   Gastroesophageal reflux disease without esophagitis 06/19/2011   Flatulence, eructation and gas pain 06/19/2011   PCP:  Gracelyn Nurse, MD Pharmacy:   [facility] [phone] - [place], [place] - [address] [place] [address] Phone: [phone] Fax: [phone]     Social Determinants of Health (SDOH) Social History: SDOH Screenings   Food Insecurity: No Food Insecurity (07/31/2022)  Housing: Low Risk  (07/31/2022)  Transportation Needs: No Transportation Needs (07/31/2022)  Utilities: Not At Risk (07/31/2022)  Depression (PHQ2-9): High Risk (09/03/2021)  Tobacco Use: Medium Risk (08/02/2022)   SDOH Interventions:     Readmission Risk Interventions    08/02/2022    2:06 PM 03/11/2022   12:18 PM  Readmission Risk Prevention Plan  Transportation Screening Complete Complete  Medication Review (RN Care Manager) Complete Complete  PCP or Specialist appointment within 3-5 days of discharge Complete  Complete  HRI or Home Care Consult Complete Complete  SW Recovery Care/Counseling Consult Complete Complete  Palliative Care Screening Not Applicable Not Applicable  Skilled Nursing Facility Complete Not Applicable

## 2022-08-02 NOTE — Op Note (Signed)
[facility] Gastroenterology Patient Name: Darlene Barber Procedure Date: 08/02/2022 10:26 AM MRN: [id number] Account #: [id number] Date of Birth: 1965/05/21 Admit Type: Inpatient Age: 57 Room: [facility] ENDO ROOM 2 Gender: Female Note Status: Finalized Instrument Name: Colonoscope [id number] Procedure:             Colonoscopy Indications:           Last colonoscopy: January 2024, Hematochezia, Acute                         post hemorrhagic anemia Providers:             Toney Reil MD, MD Medicines:             General Anesthesia Complications:         No immediate complications. Estimated blood loss: None. Procedure:             Pre-Anesthesia Assessment:                        - Prior to the procedure, a History and Physical was                         performed, and patient medications and allergies were                         reviewed. The patient is competent. The risks and                         benefits of the procedure and the sedation options and                         risks were discussed with the patient. All questions                         were answered and informed consent was obtained.                         Patient identification and proposed procedure were                         verified by the physician, the nurse, the                         anesthesiologist, the anesthetist and the technician                         in the pre-procedure area in the procedure room in the                         endoscopy suite. Mental Status Examination: alert and                         oriented. Airway Examination: normal oropharyngeal                         airway and neck mobility. Respiratory Examination:                         clear to auscultation. CV Examination:  normal.                         Prophylactic Antibiotics: The patient does not require                         prophylactic antibiotics. Prior Anticoagulants: The                          patient has taken no anticoagulant or antiplatelet                         agents. ASA Grade Assessment: III - A patient with                         severe systemic disease. After reviewing the risks and                         benefits, the patient was deemed in satisfactory                         condition to undergo the procedure. The anesthesia                         plan was to use general anesthesia. Immediately prior                         to administration of medications, the patient was                         re-assessed for adequacy to receive sedatives. The                         heart rate, respiratory rate, oxygen saturations,                         blood pressure, adequacy of pulmonary ventilation, and                         response to care were monitored throughout the                         procedure. The physical status of the patient was                         re-assessed after the procedure.                        After obtaining informed consent, the colonoscope was                         passed under direct vision. Throughout the procedure,                         the patient's blood pressure, pulse, and oxygen                         saturations were monitored continuously. The  Colonoscope was introduced through the anus and                         advanced to the the ascending colon. The colonoscopy                         was extremely difficult due to bowel stenosis.                         Successful completion of the procedure was aided by                         withdrawing the scope and replacing with the pediatric                         colonoscope. The patient tolerated the procedure well.                         The quality of the bowel preparation was poor. Findings:      The perianal and digital rectal examinations were normal. Pertinent       negatives include normal sphincter tone and no palpable rectal lesions.       A benign-appearing, long intrinsic severe stenosis was found in the       ascending colon and was non-traversed. This site corresponds to the       large ulcerated area that was previously identified large ulcerated area       in ascending colon. Biopsies were taken with a cold forceps for       histology. Estimated blood loss: none.      Copious quantities of yellow semi-liquid stool was found in the entire       colon.      The retroflexed view of the distal rectum and anal verge was normal and       showed no anal or rectal abnormalities. Impression:            - Preparation of the colon was poor.                        - Stricture in the ascending colon. Biopsied.                        - Stool in the entire examined colon.                        - The distal rectum and anal verge are normal on                         retroflexion view. Recommendation:        - Return patient to hospital ward for ongoing care.                        - Chopped diet and mechanical soft diet.                        - Miralax 1 -2 capfuls in 8-16 ounces of water PO  daily to avoid constipation.                        - Consult general surgeon or colorectal surgeon to                         evaluate for elective right hemicolectomy, inpatient                         or outpt Procedure Code(s):     --- Professional ---                        [phone], 52, Colonoscopy, flexible; with biopsy, single                         or multiple Diagnosis Code(s):     --- Professional ---                        R51.884, Other intestinal obstruction unspecified as                         to partial versus complete obstruction                        K92.1, Melena (includes Hematochezia)                        D62, Acute posthemorrhagic anemia CPT copyright 2022 American Medical Association. All rights reserved. The codes documented in this report are preliminary and upon coder review may  be  revised to meet current compliance requirements. Dr. Libby Maw Toney Reil MD, MD 08/02/2022 11:15:13 AM This report has been signed electronically. Number of Addenda: 0 Note Initiated On: 08/02/2022 10:26 AM Total Procedure Duration: 0 hours 18 minutes 2 seconds  Estimated Blood Loss:  Estimated blood loss: none.      [facility]

## 2022-08-02 NOTE — Consult Note (Signed)
[facility] SURGICAL CONSULTATION NOTE (initial) - cpt: 16109   HISTORY OF PRESENT ILLNESS (HPI):  57 y.o. Barber presented to [facility] ED in 05/21 secondary to GI bleeding. In January of this year patient was admitted at [facility] secondary to GI bleeding. She had colonoscopy at that time which I personally reviewed concerning for colonic ulcers and GI bleeding. Pathology of this was negative for malignancy nor IBD. She did require transfusion of 6 units pRBCs at that time. She was seen by general surgery there and considered TAC but she ultimately did well. Since then, seems she has had numerous presentations for abdominal pain and continued to have intermittent GI bleeding. She presented to the [facility] 05[facility]21 secondary to GI bleeding. At that time, she reports she was following up with her nephrologist and was found to be anemic with Hgb to 6.5. Additional work up in the ED revealed colitis. Given this findings and anemia with GI bleeding she was admitted to medicine service. She has been given a total of 2 units pRBCs this admission. Most recent Hgb 8.9. She is currently on Rocephin and Flagyl. She does have history of CKD. Previous abdominal surgeries positive for cholecystectomy, TAH with BSO, and C-section x2. She underwent colonoscopy again this afternoon with Dr Nevada Crane which was concerning for right colonic stricture which was benign in appearence. Images were reviewed.   Surgery is consulted by hospitalist physician Dr. Alford Highland, MD in this context for evaluation and management of right colonic stricture.  PAST MEDICAL HISTORY (PMH):  Past Medical History:  Diagnosis Date   Allergy    Anxiety    Bursitis of both hips    Depression    Frequent headaches    Obesity    Sleep apnea    doesn't use CPAP machine broken,      PAST SURGICAL HISTORY (PSH):  Past Surgical History:  Procedure Laterality Date   BIOPSY  03/30/2022   Procedure: BIOPSY;  Surgeon: Benancio Deeds, MD;  Location: [facility] ENDOSCOPY;  Service: Gastroenterology;;   CESAREAN SECTION     x2   CHOLECYSTECTOMY     COLONOSCOPY WITH PROPOFOL N/A 11/20/2017   Procedure: COLONOSCOPY WITH PROPOFOL;  Surgeon: Toney Reil, MD;  Location: [facility] ENDOSCOPY;  Service: Gastroenterology;  Laterality: N/A;   COLONOSCOPY WITH PROPOFOL N/A 03/30/2022   Procedure: COLONOSCOPY WITH PROPOFOL;  Surgeon: Benancio Deeds, MD;  Location: [facility] ENDOSCOPY;  Service: Gastroenterology;  Laterality: N/A;   ESOPHAGOGASTRODUODENOSCOPY (EGD) WITH PROPOFOL N/A 03/30/2022   Procedure: ESOPHAGOGASTRODUODENOSCOPY (EGD) WITH PROPOFOL;  Surgeon: Benancio Deeds, MD;  Location: [facility] ENDOSCOPY;  Service: Gastroenterology;  Laterality: N/A;   HYSTERECTOMY ABDOMINAL WITH SALPINGECTOMY  1998   HYSTEROSCOPY       MEDICATIONS:  Prior to Admission medications   Medication Sig Start Date End Date Taking? Authorizing Provider  acyclovir (ZOVIRAX) 400 MG tablet Take 400 mg by mouth 2 (two) times daily.   Yes [provider]  albuterol (VENTOLIN HFA) 108 (90 Base) MCG/ACT inhaler INHALE 2 PUFFS EVERY FOUR HOURS AS NEEDED Patient taking differently: Inhale 2 puffs into the lungs every 4 (four) hours as needed for wheezing or shortness of breath. 10/08/21  Yes Karamalegos, Alexander J, DO  amLODipine (NORVASC) 10 MG tablet Take 10 mg by mouth daily.   Yes [provider]  B Complex-C (SUPER B COMPLEX PO) Take 1 tablet by mouth daily with breakfast.   Yes [provider]  brexpiprazole (REXULTI) 1 MG TABS tablet  Take 1 mg by mouth daily.   Yes [provider]  BREYNA 160-4.5 MCG/ACT inhaler Inhale 2 puffs into the lungs 2 (two) times daily.   Yes [provider]  Calcium Carbonate Antacid (ANTACID SOFT CHEWS PO) Take 1 tablet by mouth every 6 (six) hours as needed (for indigesation- CHEW).   Yes [provider]  COLACE 100 MG capsule Take 100 mg by mouth in the morning.   Yes  [provider]  Exenatide ER 2 MG PEN Inject 1 Dose into the skin once a week.   Yes [provider]  fluticasone (FLONASE) 50 MCG/ACT nasal spray 1 SPARY IN EACH NOSTRIL DAILY Patient taking differently: Place 1 spray into both nostrils daily. 10/08/21  Yes Karamalegos, Netta Neat, DO  loratadine (CLARITIN) 10 MG tablet Take 10 mg by mouth in the morning and at bedtime.   Yes [provider]  nortriptyline (PAMELOR) 25 MG capsule Take 25-50 mg by mouth See admin instructions. Take 25 mg (1 capsule) by mouth every morning, and 50 mg (2 capsules) every night at bedtime 10/24/20  Yes [provider]  omeprazole (PRILOSEC) 40 MG capsule Take 40 mg by mouth daily before breakfast.   Yes [provider]  ondansetron (ZOFRAN-ODT) 4 MG disintegrating tablet Take 4 mg by mouth every 8 (eight) hours as needed for nausea.   Yes [provider]  pregabalin (LYRICA) 25 MG capsule Take 25 mg by mouth in the morning.   Yes [provider]  Probiotic Product (PROBIOTIC PO) Take 1 capsule by mouth daily.   Yes [provider]  QUEtiapine (SEROQUEL) 300 MG tablet Take 300 mg by mouth at bedtime. 05/24/22  Yes [provider]  sertraline (ZOLOFT) 100 MG tablet Take 100 mg by mouth daily.   Yes [provider]  sucralfate (CARAFATE) 1 g tablet Take 1 g by mouth See admin instructions. Take 1 gram by mouth in the morning and before supper   Yes [provider]  tiotropium (SPIRIVA) 18 MCG inhalation capsule Place 1 capsule (18 mcg total) into inhaler and inhale daily. 06/02/22  Yes Kathlen Mody, MD  traZODone (DESYREL) 50 MG tablet Take 1 tablet (50 mg total) by mouth at bedtime as needed for sleep. Patient taking differently: Take 50 mg by mouth at bedtime. 06/01/22 06/01/23 Yes Kathlen Mody, MD  EMGALITY 120 MG/ML SOSY Inject into the skin every 30 (thirty) days. 01/01/22   [provider]  famotidine (PEPCID) 10  MG tablet Take 1 tablet (10 mg total) by mouth daily. Patient not taking: Reported on 07/15/2022 06/02/22   Kathlen Mody, MD  ferrous sulfate 325 (Darlene FE) MG tablet Take 325 mg by mouth daily with breakfast. Patient not taking: Reported on 07/30/2022    [provider]  furosemide (LASIX) 20 MG tablet Take 1 tablet (20 mg total) by mouth daily. Patient not taking: Reported on 07/15/2022 06/17/22 06/17/23  Cuthriell, Delorise Royals, PA-C  lidocaine (LIDODERM) 5 % Place 1 patch onto the skin daily. Remove & Discard patch within 12 hours or as directed by MD Patient not taking: Reported on 07/15/2022 04/14/22   Leroy Sea, MD  loperamide (IMODIUM) 2 MG capsule Take 1 capsule (2 mg total) by mouth as needed for diarrhea or loose stools. Patient not taking: Reported on 07/15/2022 04/23/22   Elgergawy, Leana Roe, MD  multivitamin (RENA-VIT) TABS tablet Take 1 tablet by mouth at bedtime. Patient not taking: Reported on 07/15/2022 04/13/22   [name],  Stanford Scotland, MD  Nutritional Supplements (,FEEDING SUPPLEMENT, PROSOURCE PLUS) liquid Take 30 mLs by mouth 2 (two) times daily between meals. Patient not taking: Reported on 07/15/2022 04/13/22   Leroy Sea, MD  promethazine (PHENERGAN) 25 MG tablet Take 1 tablet (25 mg total) by mouth every 6 (six) hours as needed for nausea or vomiting. Patient not taking: Reported on 07/15/2022 09/03/21   Smitty Cords, DO  sodium bicarbonate 650 MG tablet Take 1 tablet (650 mg total) by mouth 2 (two) times daily. Patient not taking: Reported on 07/15/2022 04/23/22   Elgergawy, Leana Roe, MD     ALLERGIES:  Allergies  Allergen Reactions   Other Anaphylaxis, Itching, Swelling and Other (See Comments)    Black pepper   Black Pepper-Turmeric    Latex Rash   Silicone Rash   Tape Rash and Other (See Comments)    Paper tape is ok to use.     SOCIAL HISTORY:  Social History   Socioeconomic History   Marital status: Divorced    Spouse name: Not on file   Number of  children: Not on file   Years of education: High School   Highest education level: Not on file  Occupational History   Occupation: Unemployed  Tobacco Use   Smoking status: Former    Packs/day: 1.00    Years: 30.00    Additional pack years: 0.00    Total pack years: 30.00    Types: Cigarettes, Cigars   Smokeless tobacco: Never  Vaping Use   Vaping Use: Some days  Substance and Sexual Activity   Alcohol use: Yes    Comment: occ once per year   Drug use: Yes    Types: Marijuana    Comment: Current use   Sexual activity: Not Currently  Other Topics Concern   Not on file  Social History Narrative   Not on file   Social Determinants of Health   Financial Resource Strain: Not on file  Food Insecurity: No Food Insecurity (07/31/2022)   Hunger Vital Sign    Worried About Running Out of Food in the Last Year: Never true    Ran Out of Food in the Last Year: Never true  Transportation Needs: No Transportation Needs (07/31/2022)   PRAPARE - Administrator, Civil Service (Medical): No    Lack of Transportation (Non-Medical): No  Physical Activity: Not on file  Stress: Not on file  Social Connections: Not on file  Intimate Partner Violence: Not At Risk (07/31/2022)   Humiliation, Afraid, Rape, and Kick questionnaire    Fear of Current or Ex-Partner: No    Emotionally Abused: No    Physically Abused: No    Sexually Abused: No     FAMILY HISTORY:  Family History  Problem Relation Age of Onset   Hodgkin's lymphoma Mother    Heart failure Maternal Grandmother    Alzheimer's disease Maternal Grandfather    Emphysema Paternal Grandmother    Diabetes Maternal Aunt    Diabetes Maternal Uncle    Cancer Other    Breast cancer Neg Hx       REVIEW OF SYSTEMS:  Review of Systems  Constitutional:  Negative for chills and fever.  Respiratory:  Negative for cough and shortness of breath.   Cardiovascular:  Negative for chest pain and palpitations.  Gastrointestinal:   Positive for abdominal pain and blood in stool. Negative for constipation, diarrhea, nausea and vomiting.  Genitourinary:  Negative for dysuria and urgency.  All other systems reviewed and are negative.   VITAL SIGNS:  Temp:  [96.5 F (35.8 C)-98 F (36.7 C)] 96.8 F (36 C) (05/24 1112) Pulse Rate:  [80-103] 90 (05/24 1132) Resp:  [14-29] 26 (05/24 1132) BP: (85-161)/(60-97) 104/80 (05/24 1142) SpO2:  [98 %-100 %] 98 % (05/24 1142)     Height: 4\' 10"  (147.3 cm) Weight: 62.1 kg BMI (Calculated): 28.64   INTAKE/OUTPUT:  No intake/output data recorded.  PHYSICAL EXAM:  Physical Exam Vitals and nursing note reviewed. Exam conducted with a chaperone present.  Constitutional:      General: She is not in acute distress.    Appearance: Normal appearance. She is not ill-appearing.     Comments: Resting in bed; NAD  HENT:     Head: Normocephalic and atraumatic.  Eyes:     General: No scleral icterus.    Conjunctiva/sclera: Conjunctivae normal.  Cardiovascular:     Rate and Rhythm: Normal rate.     Pulses: Normal pulses.  Pulmonary:     Effort: Pulmonary effort is normal. No respiratory distress.  Abdominal:     General: Abdomen is flat. A surgical scar is present. There is no distension.     Palpations: Abdomen is soft.     Tenderness: There is no abdominal tenderness. There is no guarding or rebound.     Comments: Abdomen soft, non-tender, non-distended, no rebound/guarding.   Genitourinary:    Comments: Deferred Musculoskeletal:     Right lower leg: No edema.  Skin:    General: Skin is warm and dry.     Coloration: Skin is not jaundiced.  Neurological:     General: No focal deficit present.     Mental Status: She is alert and oriented to person, place, and time.  Psychiatric:        Mood and Affect: Mood normal.        Behavior: Behavior normal.      Labs:     Latest Ref Rng & Units 08/02/2022    4:35 AM 08/01/2022    4:28 AM 07/31/2022    3:12 PM  CBC  WBC 4.0 -  10.5 K/uL 4.4  5.1    Hemoglobin 12.0 - 15.0 g/dL 8.9  8.7  9.3   Hematocrit 36.0 - 46.0 % 26.1  25.9    Platelets 150 - 400 K/uL 390  361        Latest Ref Rng & Units 08/02/2022    4:35 AM 08/01/2022    4:28 AM 07/31/2022    4:56 AM  CMP  Glucose 70 - 99 mg/dL 89  409  811   BUN 6 - 20 mg/dL 24  30  37   Creatinine 0.44 - 1.00 mg/dL 9.14  7.82  9.56   Sodium 135 - 145 mmol/L 138  136  137   Potassium 3.5 - 5.1 mmol/L 3.5  3.1  3.5   Chloride 98 - 111 mmol/L 112  112  114   CO2 22 - 32 mmol/L 20  17  17    Calcium 8.9 - 10.3 mg/dL 8.4  8.3  8.4      Imaging studies:   CT Abdomen/Pelvis (07/30/2022) personally reviewed showing evidence of colitis, no pneumatosis, no free air, no obvious evidence of obstruction, and radiologist report reviewed below:  IMPRESSION: 1. Acute colitis involving the ascending and transverse colon. 2. Nonobstructing bilateral nephrolithiasis. 3. Low-attenuation blood pool compatible with anemia.   Assessment/Plan: (ICD-10's: K90.699) 57 y.o. Barber with recent history  of GI bleeding found to have ascending colon stricture on colonoscopy, complicated by pertinent comorbidities including CKD.   - Given ascending colon stricture, will plan on hand-assisted laparoscopic right hemicolectomy tomorrow (05/25) with Dr Everlene Farrier pending OR/Anesthesia availability    - All risks, benefits, and alternatives to above procedure(s) were discussed with the patient, all of her questions were answered to her expressed satisfaction, patient expresses she wishes to proceed, and informed consent was obtained.    - Okay for CLD now; NPO at midnight - Will add PO Abx for bowel prep; otherwise prep already completed for colonoscopy - Okay to continue IV Abx for colitis - Monitor H&H; stable - Monitor abdominal examination - Further management per primary service; we will follow   All of the above findings and recommendations were discussed with the patient, and all of patient's  questions were answered to her expressed satisfaction.  Thank you for the opportunity to participate in this patient's care.   -- Lynden Oxford, PA-C  [facility] 08/02/2022, 12:12 PM M-F: 7am - 4pm

## 2022-08-02 NOTE — Anesthesia Postprocedure Evaluation (Signed)
Anesthesia Post Note  Patient: Darlene Barber  Procedure(s) Performed: COLONOSCOPY WITH PROPOFOL  Patient location during evaluation: PACU Anesthesia Type: General Level of consciousness: awake and awake and alert Pain management: satisfactory to patient Vital Signs Assessment: post-procedure vital signs reviewed and stable Respiratory status: spontaneous breathing and nonlabored ventilation Cardiovascular status: stable Anesthetic complications: no   No notable events documented.   Last Vitals:  Vitals:   08/02/22 1027 08/02/22 1112  BP: 132/82 (!) 86/60  Pulse: 83 80  Resp: 16 14  Temp: (!) 35.8 C (!) 36 C  SpO2: 100% 100%    Last Pain:  Vitals:   08/02/22 1112  TempSrc: Temporal  PainSc: Asleep                 VAN STAVEREN,Easten Maceachern

## 2022-08-03 ENCOUNTER — Encounter
Admission: EM | Disposition: A | Discharge status: Home Health | Payer: Self-pay | Source: Home / Self Care | Attending: Internal Medicine

## 2022-08-03 ENCOUNTER — Inpatient Hospital Stay: Payer: Medicaid Other | Admitting: Anesthesiology

## 2022-08-03 DIAGNOSIS — K56699 Other intestinal obstruction unspecified as to partial versus complete obstruction: Secondary | ICD-10-CM | POA: Diagnosis not present

## 2022-08-03 DIAGNOSIS — E872 Acidosis, unspecified: Secondary | ICD-10-CM | POA: Diagnosis not present

## 2022-08-03 DIAGNOSIS — K922 Gastrointestinal hemorrhage, unspecified: Secondary | ICD-10-CM | POA: Diagnosis not present

## 2022-08-03 DIAGNOSIS — D638 Anemia in other chronic diseases classified elsewhere: Secondary | ICD-10-CM | POA: Diagnosis not present

## 2022-08-03 HISTORY — PX: LAPAROSCOPIC SUBTOTAL COLECTOMY: SHX5930

## 2022-08-03 LAB — TYPE AND SCREEN: Antibody Screen: NEGATIVE

## 2022-08-03 LAB — GLUCOSE, CAPILLARY
Glucose-Capillary: 203 mg/dL — ABNORMAL HIGH (ref 70–99)
Glucose-Capillary: 214 mg/dL — ABNORMAL HIGH (ref 70–99)
Glucose-Capillary: 204 mg/dL — ABNORMAL HIGH (ref 70–99)

## 2022-08-03 LAB — BPAM RBC: Unit Type and Rh: 6200

## 2022-08-03 LAB — BASIC METABOLIC PANEL
Anion gap: 7 (ref 5–15)
BUN: 21 mg/dL — ABNORMAL HIGH (ref 6–20)
CO2: 17 mmol/L — ABNORMAL LOW (ref 22–32)
Calcium: 8.2 mg/dL — ABNORMAL LOW (ref 8.9–10.3)
Chloride: 114 mmol/L — ABNORMAL HIGH (ref 98–111)
Creatinine, Ser: 2.63 mg/dL — ABNORMAL HIGH (ref 0.44–1.00)
GFR, Estimated: 21 mL/min — ABNORMAL LOW (ref >60)
Glucose, Bld: 86 mg/dL (ref 70–99)
Potassium: 3.7 mmol/L (ref 3.5–5.1)
Sodium: 138 mmol/L (ref 135–145)

## 2022-08-03 LAB — ANA W/REFLEX IF POSITIVE: Anti Nuclear Antibody (ANA): NEGATIVE

## 2022-08-03 LAB — CBC
HCT: 23.2 % — ABNORMAL LOW (ref 36.0–46.0)
Hemoglobin: 7.6 g/dL — ABNORMAL LOW (ref 12.0–15.0)
MCH: 30.4 pg (ref 26.0–34.0)
MCHC: 32.8 g/dL (ref 30.0–36.0)
MCV: 92.8 fL (ref 80.0–100.0)
Platelets: 348 10*3/uL (ref 150–400)
RBC: 2.5 MIL/uL — ABNORMAL LOW (ref 3.87–5.11)
RDW: 17.2 % — ABNORMAL HIGH (ref 11.5–15.5)
WBC: 4.3 10*3/uL (ref 4.0–10.5)
nRBC: 0 % (ref 0.0–0.2)

## 2022-08-03 LAB — PREPARE RBC (CROSSMATCH)

## 2022-08-03 SURGERY — LAPAROSCOPIC SUBTOTAL COLECTOMY
Anesthesia: General | Laterality: Right

## 2022-08-03 MED ORDER — PHENYLEPHRINE 80 MCG/ML (10ML) SYRINGE FOR IV PUSH (FOR BLOOD PRESSURE SUPPORT)
PREFILLED_SYRINGE | INTRAVENOUS | Status: AC
  Filled 2022-08-03: qty 10

## 2022-08-03 MED ORDER — LIDOCAINE HCL (CARDIAC) PF 100 MG/5ML IV SOSY
PREFILLED_SYRINGE | INTRAVENOUS | Status: DC | PRN
  Administered 2022-08-03: 50 mg via INTRAVENOUS

## 2022-08-03 MED ORDER — 0.9 % SODIUM CHLORIDE (POUR BTL) OPTIME
TOPICAL | Status: DC | PRN
  Administered 2022-08-03: 500 mL

## 2022-08-03 MED ORDER — SODIUM CHLORIDE 0.9 % IR SOLN
Status: DC | PRN
  Administered 2022-08-03: 1000 mL

## 2022-08-03 MED ORDER — OXYCODONE HCL 5 MG/5ML PO SOLN: 5.0000 mg | Freq: Once | ORAL | Status: AC | PRN

## 2022-08-03 MED ORDER — KETAMINE HCL 50 MG/5ML IJ SOSY
PREFILLED_SYRINGE | INTRAMUSCULAR | Status: AC
  Filled 2022-08-03: qty 5

## 2022-08-03 MED ORDER — FENTANYL CITRATE (PF) 100 MCG/2ML IJ SOLN
INTRAMUSCULAR | Status: DC | PRN
  Administered 2022-08-03 (×4): 50 ug via INTRAVENOUS

## 2022-08-03 MED ORDER — FENTANYL CITRATE (PF) 100 MCG/2ML IJ SOLN
25.0000 ug | INTRAMUSCULAR | Status: DC | PRN
  Administered 2022-08-03 (×2): 25 ug via INTRAVENOUS
  Administered 2022-08-03: 50 ug via INTRAVENOUS

## 2022-08-03 MED ORDER — ROCURONIUM BROMIDE 10 MG/ML (PF) SYRINGE
PREFILLED_SYRINGE | INTRAVENOUS | Status: AC
  Filled 2022-08-03: qty 10

## 2022-08-03 MED ORDER — ORAL CARE MOUTH RINSE: 15.0000 mL | OROMUCOSAL | Status: DC | PRN

## 2022-08-03 MED ORDER — MORPHINE SULFATE (PF) 4 MG/ML IV SOLN: 4.0000 mg | INTRAVENOUS | Status: DC | PRN

## 2022-08-03 MED ORDER — FENTANYL CITRATE (PF) 100 MCG/2ML IJ SOLN
INTRAMUSCULAR | Status: AC
  Filled 2022-08-03: qty 2

## 2022-08-03 MED ORDER — DEXTROSE 50 % IV SOLN
INTRAVENOUS | Status: DC | PRN
  Administered 2022-08-03: 12.5 g via INTRAVENOUS

## 2022-08-03 MED ORDER — PREGABALIN 50 MG PO CAPS
100.0000 mg | ORAL_CAPSULE | Freq: Three times a day (TID) | ORAL | Status: DC
  Administered 2022-08-03 – 2022-08-05 (×6): 100 mg via ORAL
  Filled 2022-08-03 (×6): qty 2

## 2022-08-03 MED ORDER — SUGAMMADEX SODIUM 200 MG/2ML IV SOLN
INTRAVENOUS | Status: DC | PRN
  Administered 2022-08-03: 200 mg via INTRAVENOUS

## 2022-08-03 MED ORDER — SEVOFLURANE IN SOLN
RESPIRATORY_TRACT | Status: AC
  Filled 2022-08-03: qty 250

## 2022-08-03 MED ORDER — METRONIDAZOLE 500 MG/100ML IV SOLN
500.0000 mg | Freq: Once | INTRAVENOUS | Status: AC
  Filled 2022-08-03: qty 100

## 2022-08-03 MED ORDER — ACETAMINOPHEN 500 MG PO TABS
1000.0000 mg | ORAL_TABLET | Freq: Four times a day (QID) | ORAL | Status: DC
  Administered 2022-08-03 – 2022-08-05 (×8): 1000 mg via ORAL
  Filled 2022-08-03 (×11): qty 2

## 2022-08-03 MED ORDER — HYDROMORPHONE HCL 1 MG/ML IJ SOLN
INTRAMUSCULAR | Status: AC
  Filled 2022-08-03: qty 1

## 2022-08-03 MED ORDER — SODIUM CHLORIDE 0.9 % IV SOLN
INTRAVENOUS | Status: AC
  Filled 2022-08-03: qty 2

## 2022-08-03 MED ORDER — ONDANSETRON HCL 4 MG/2ML IJ SOLN
INTRAMUSCULAR | Status: AC
  Filled 2022-08-03: qty 2

## 2022-08-03 MED ORDER — HYDROMORPHONE HCL 1 MG/ML IJ SOLN
INTRAMUSCULAR | Status: DC | PRN
  Administered 2022-08-03 (×2): .5 mg via INTRAVENOUS

## 2022-08-03 MED ORDER — LACTATED RINGERS IV SOLN: INTRAVENOUS | Status: DC | PRN

## 2022-08-03 MED ORDER — SODIUM CHLORIDE 0.9 % IV SOLN
2.0000 g | Freq: Two times a day (BID) | INTRAVENOUS | Status: AC
  Administered 2022-08-03: 2 g via INTRAVENOUS

## 2022-08-03 MED ORDER — PROPOFOL 10 MG/ML IV BOLUS
INTRAVENOUS | Status: DC | PRN
  Administered 2022-08-03: 100 mg via INTRAVENOUS

## 2022-08-03 MED ORDER — GLYCOPYRROLATE 0.2 MG/ML IJ SOLN
INTRAMUSCULAR | Status: DC | PRN
  Administered 2022-08-03: .2 mg via INTRAVENOUS

## 2022-08-03 MED ORDER — PROPOFOL 10 MG/ML IV BOLUS
INTRAVENOUS | Status: AC
  Filled 2022-08-03: qty 20

## 2022-08-03 MED ORDER — SODIUM CHLORIDE (PF) 0.9 % IJ SOLN
INTRAMUSCULAR | Status: DC | PRN
  Administered 2022-08-03: 100 mL

## 2022-08-03 MED ORDER — DEXTROSE 50 % IV SOLN
INTRAVENOUS | Status: AC
  Filled 2022-08-03: qty 50

## 2022-08-03 MED ORDER — OXYCODONE HCL 5 MG PO TABS
ORAL_TABLET | ORAL | Status: AC
  Filled 2022-08-03: qty 1

## 2022-08-03 MED ORDER — DEXAMETHASONE SODIUM PHOSPHATE 10 MG/ML IJ SOLN
INTRAMUSCULAR | Status: DC | PRN
  Administered 2022-08-03: 10 mg via INTRAVENOUS

## 2022-08-03 MED ORDER — CEFAZOLIN SODIUM-DEXTROSE 2-3 GM-%(50ML) IV SOLR
INTRAVENOUS | Status: DC | PRN
  Administered 2022-08-03: 2 g via INTRAVENOUS

## 2022-08-03 MED ORDER — SODIUM CHLORIDE 0.9 % IV SOLN: 10.0000 mL/h | Freq: Once | INTRAVENOUS | Status: AC

## 2022-08-03 MED ORDER — DEXAMETHASONE SODIUM PHOSPHATE 10 MG/ML IJ SOLN
INTRAMUSCULAR | Status: AC
  Filled 2022-08-03: qty 1

## 2022-08-03 MED ORDER — OXYCODONE HCL 5 MG PO TABS
5.0000 mg | ORAL_TABLET | Freq: Once | ORAL | Status: AC | PRN
  Administered 2022-08-03: 5 mg via ORAL

## 2022-08-03 MED ORDER — MIDAZOLAM HCL 2 MG/2ML IJ SOLN
INTRAMUSCULAR | Status: AC
  Filled 2022-08-03: qty 2

## 2022-08-03 MED ORDER — ROCURONIUM BROMIDE 100 MG/10ML IV SOLN
INTRAVENOUS | Status: DC | PRN
  Administered 2022-08-03: 50 mg via INTRAVENOUS
  Administered 2022-08-03: 30 mg via INTRAVENOUS
  Administered 2022-08-03 (×2): 20 mg via INTRAVENOUS

## 2022-08-03 MED ORDER — PHENYLEPHRINE HCL (PRESSORS) 10 MG/ML IV SOLN
INTRAVENOUS | Status: DC | PRN
  Administered 2022-08-03: 80 ug via INTRAVENOUS

## 2022-08-03 MED ORDER — MIDAZOLAM HCL 2 MG/2ML IJ SOLN
INTRAMUSCULAR | Status: DC | PRN
  Administered 2022-08-03: 2 mg via INTRAVENOUS

## 2022-08-03 MED ORDER — GLYCOPYRROLATE 0.2 MG/ML IJ SOLN
INTRAMUSCULAR | Status: AC
  Filled 2022-08-03: qty 1

## 2022-08-03 MED ORDER — ACETAMINOPHEN 10 MG/ML IV SOLN
INTRAVENOUS | Status: DC | PRN
  Administered 2022-08-03: 1000 mg via INTRAVENOUS

## 2022-08-03 MED ORDER — KETAMINE HCL 50 MG/ML IJ SOLN
INTRAMUSCULAR | Status: DC | PRN
  Administered 2022-08-03: 20 mg via INTRAVENOUS
  Administered 2022-08-03: 30 mg via INTRAVENOUS

## 2022-08-03 MED ORDER — CEFAZOLIN SODIUM 1 G IJ SOLR
INTRAMUSCULAR | Status: AC
  Filled 2022-08-03: qty 20

## 2022-08-03 MED ORDER — METRONIDAZOLE 500 MG/100ML IV SOLN
INTRAVENOUS | Status: DC | PRN
  Administered 2022-08-03: 500 mg via INTRAVENOUS

## 2022-08-03 MED ORDER — ONDANSETRON HCL 4 MG/2ML IJ SOLN
INTRAMUSCULAR | Status: DC | PRN
  Administered 2022-08-03: 4 mg via INTRAVENOUS

## 2022-08-03 MED ORDER — ACETAMINOPHEN 10 MG/ML IV SOLN
INTRAVENOUS | Status: AC
  Filled 2022-08-03: qty 100

## 2022-08-03 SURGICAL SUPPLY — 88 items
ADH SKN CLS APL DERMABOND .7 (GAUZE/BANDAGES/DRESSINGS) ×2
APPLIER CLIP 13 LRG OPEN (CLIP)
APR CLP LRG 13 20 CLIP (CLIP)
BAG DECANTER FOR FLEXI CONT (MISCELLANEOUS) ×1 IMPLANT
BARRIER ADH SEPRAFILM 3INX5IN (MISCELLANEOUS) IMPLANT
BLADE CLIPPER SURG (BLADE) ×1 IMPLANT
BLADE SURG SZ10 CARB STEEL (BLADE) ×1 IMPLANT
BLADE SURG SZ11 CARB STEEL (BLADE) ×1 IMPLANT
BRR ADH 5X3 SEPRAFILM 2 SHT (MISCELLANEOUS) ×1
BULB RESERV EVAC DRAIN JP 100C (MISCELLANEOUS) ×1 IMPLANT
CATH ROBINSON RED A/P 14FR (CATHETERS) ×1 IMPLANT
CLIP APPLIE 13 LRG OPEN (CLIP) ×1 IMPLANT
DERMABOND ADVANCED .7 DNX12 (GAUZE/BANDAGES/DRESSINGS) ×2 IMPLANT
DRAPE INCISE IOBAN 66X45 STRL (DRAPES) ×1 IMPLANT
DRAPE LEGGINS SURG 28X43 STRL (DRAPES) ×1 IMPLANT
DRAPE UNDER BUTTOCK W/FLU (DRAPES) ×1 IMPLANT
DRSG OPSITE POSTOP 3X4 (GAUZE/BANDAGES/DRESSINGS) IMPLANT
ELECT BLADE 6.5 EXT (BLADE) ×1 IMPLANT
ELECT CAUTERY BLADE 6.4 (BLADE) ×1 IMPLANT
ELECT CAUTERY BLADE TIP 2.5 (TIP) ×1
ELECT REM PT RETURN 9FT ADLT (ELECTROSURGICAL) ×1
ELECTRODE CAUTERY BLDE TIP 2.5 (TIP) ×1 IMPLANT
ELECTRODE REM PT RTRN 9FT ADLT (ELECTROSURGICAL) ×1 IMPLANT
GAUZE SPONGE 4X4 12PLY STRL (GAUZE/BANDAGES/DRESSINGS) ×1 IMPLANT
GLOVE BIO SURGEON STRL SZ7 (GLOVE) ×2 IMPLANT
GOWN STRL REUS W/ TWL LRG LVL3 (GOWN DISPOSABLE) ×4 IMPLANT
GOWN STRL REUS W/TWL LRG LVL3 (GOWN DISPOSABLE) ×4
HANDLE SUCTION POOLE (INSTRUMENTS) ×1 IMPLANT
HANDLE YANKAUER SUCT BULB TIP (MISCELLANEOUS) ×1 IMPLANT
IRRIGATION STRYKERFLOW (MISCELLANEOUS) ×1 IMPLANT
IRRIGATOR STRYKERFLOW (MISCELLANEOUS) ×1
IV NS 1000ML (IV SOLUTION) ×1
IV NS 1000ML BAXH (IV SOLUTION) ×1 IMPLANT
JACKSON PRATT 10 (INSTRUMENTS) ×1 IMPLANT
KIT PINK PAD W/HEAD ARE REST (MISCELLANEOUS) ×1 IMPLANT
KIT PINK PAD W/HEAD ARM REST (MISCELLANEOUS) ×1 IMPLANT
L-HOOK LAP DISP 36CM (ELECTROSURGICAL) ×1
LHOOK LAP DISP 36CM (ELECTROSURGICAL) ×1 IMPLANT
MANIFOLD NEPTUNE II (INSTRUMENTS) ×1 IMPLANT
MARKER SKIN DUAL TIP RULER LAB (MISCELLANEOUS) ×1 IMPLANT
NDL HYPO 22X1.5 SAFETY MO (MISCELLANEOUS) ×1 IMPLANT
NEEDLE HYPO 22X1.5 SAFETY MO (MISCELLANEOUS) ×1 IMPLANT
PACK COLON CLEAN CLOSURE (MISCELLANEOUS) ×1 IMPLANT
PACK LAP CHOLECYSTECTOMY (MISCELLANEOUS) ×1 IMPLANT
PENCIL SMOKE EVACUATOR (MISCELLANEOUS) ×1 IMPLANT
RELOAD STAPLE 60 2.6 WHT THN (STAPLE) ×2 IMPLANT
RELOAD STAPLE 60 3.6 BLU REG (STAPLE) ×4 IMPLANT
RELOAD STAPLER BLUE 60MM (STAPLE) ×2 IMPLANT
RELOAD STAPLER WHITE 60MM (STAPLE) ×2 IMPLANT
SCISSORS METZENBAUM CVD 33 (INSTRUMENTS) ×1 IMPLANT
SET TUBE SMOKE EVAC HIGH FLOW (TUBING) ×1 IMPLANT
SHEARS HARMONIC ACE PLUS 36CM (ENDOMECHANICALS) ×1 IMPLANT
SLEEVE Z-THREAD 5X100MM (TROCAR) ×1 IMPLANT
SOL PREP PVP 2OZ (MISCELLANEOUS)
SOLUTION PREP PVP 2OZ (MISCELLANEOUS) ×1 IMPLANT
SPONGE T-LAP 18X18 ~~LOC~~+RFID (SPONGE) ×3 IMPLANT
STAPLE ECHEON FLEX 60 POW ENDO (STAPLE) IMPLANT
STAPLER CIRCULAR MANUAL XL 25 (STAPLE) IMPLANT
STAPLER CIRCULAR MANUAL XL 29 (STAPLE) IMPLANT
STAPLER CIRCULAR MANUAL XL 33 (STAPLE) IMPLANT
STAPLER RELOAD BLUE 60MM (STAPLE) ×2
STAPLER RELOAD WHITE 60MM (STAPLE) ×2
STAPLER SKIN PROX 35W (STAPLE) ×1 IMPLANT
SUCT SIGMOIDOSCOPE TIP 18 W/TU (SUCTIONS) ×1 IMPLANT
SUCTION POOLE HANDLE (INSTRUMENTS) ×1
SUT DVC VLOC 90 3-0 CV23 VLT (SUTURE) ×1
SUT MNCRL AB 4-0 PS2 18 (SUTURE) ×2 IMPLANT
SUT PDS AB 0 CT1 27 (SUTURE) ×2 IMPLANT
SUT SILK 2 0 (SUTURE) ×1
SUT SILK 2 0SH CR/8 30 (SUTURE) ×1 IMPLANT
SUT SILK 2-0 (SUTURE) ×1 IMPLANT
SUT SILK 2-0 18XBRD TIE 12 (SUTURE) ×1 IMPLANT
SUT SILK 3-0 (SUTURE) ×1 IMPLANT
SUT VIC AB 2-0 SH 27 (SUTURE) ×2
SUT VIC AB 2-0 SH 27XBRD (SUTURE) ×2 IMPLANT
SUTURE DVC VLC 90 3-0 CV23 VLT (SUTURE) IMPLANT
SYR 20ML LL LF (SYRINGE) ×2 IMPLANT
SYR 50ML LL SCALE MARK (SYRINGE) ×1 IMPLANT
SYR TOOMEY IRRIG 70ML (MISCELLANEOUS) ×1
SYRINGE TOOMEY IRRIG 70ML (MISCELLANEOUS) ×1 IMPLANT
SYS LAPSCP GELPORT 120MM (MISCELLANEOUS) ×1
SYSTEM LAPSCP GELPORT 120MM (MISCELLANEOUS) ×1 IMPLANT
TOWEL OR 17X26 4PK STRL BLUE (TOWEL DISPOSABLE) ×1 IMPLANT
TRAP FLUID SMOKE EVACUATOR (MISCELLANEOUS) ×1 IMPLANT
TRAY FOLEY MTR SLVR 16FR STAT (SET/KITS/TRAYS/PACK) ×1 IMPLANT
TROCAR Z-THREAD FIOS 12X100MM (TROCAR) ×1 IMPLANT
TROCAR Z-THREAD FIOS 5X100MM (TROCAR) ×1 IMPLANT
WATER STERILE IRR 500ML POUR (IV SOLUTION) ×1 IMPLANT

## 2022-08-03 NOTE — Progress Notes (Signed)
CBG check in PACU was 60. Pulled D50 for Darlene Barber for OR.

## 2022-08-03 NOTE — Anesthesia Preprocedure Evaluation (Addendum)
Anesthesia Evaluation  Patient identified by MRN, date of birth, ID band Patient awake    Reviewed: Allergy & Precautions, NPO status , [name] Chart, lab work & pertinent test results  History of Anesthesia Complications (+) DIFFICULT IV STICK / SPECIAL LINENegative for: history of anesthetic complications  Airway Mallampati: III  TM Distance: <3 FB Neck ROM: full    Dental  (+) Missing   Pulmonary shortness of breath and with exertion, sleep apnea , COPD, former smoker   Pulmonary exam normal        Cardiovascular hypertension, (-) angina (-) Past MI Normal cardiovascular exam     Neuro/Psych  Headaches PSYCHIATRIC DISORDERS       Neuromuscular disease    GI/Hepatic Neg liver ROS, PUD,GERD  Controlled,,  Endo/Other  diabetes, Type 2    Renal/GU CRFRenal disease     Musculoskeletal   Abdominal   Peds  Hematology negative hematology ROS (+)   Anesthesia Other Findings Past Medical History: No date: Allergy No date: Anxiety No date: Bursitis of both hips No date: Depression No date: Frequent headaches No date: Obesity No date: Sleep apnea     Comment:  doesn't use CPAP machine broken,   Past Surgical History: 03/30/2022: BIOPSY     Comment:  Procedure: BIOPSY;  Surgeon: Benancio Deeds, MD;                Location: [facility] ENDOSCOPY;  Service: Gastroenterology;; No date: CESAREAN SECTION     Comment:  x2 No date: CHOLECYSTECTOMY 11/20/2017: COLONOSCOPY WITH PROPOFOL; N/A     Comment:  Procedure: COLONOSCOPY WITH PROPOFOL;  Surgeon: Toney Reil, MD;  Location: [facility] ENDOSCOPY;  Service:               Gastroenterology;  Laterality: N/A; 03/30/2022: COLONOSCOPY WITH PROPOFOL; N/A     Comment:  Procedure: COLONOSCOPY WITH PROPOFOL;  Surgeon:               Benancio Deeds, MD;  Location: [facility] ENDOSCOPY;                Service: Gastroenterology;  Laterality: N/A; 03/30/2022:  ESOPHAGOGASTRODUODENOSCOPY (EGD) WITH PROPOFOL; N/A     Comment:  Procedure: ESOPHAGOGASTRODUODENOSCOPY (EGD) WITH               PROPOFOL;  Surgeon: Benancio Deeds, MD;  Location:               [facility] ENDOSCOPY;  Service: Gastroenterology;  Laterality:               N/A; 1998: HYSTERECTOMY ABDOMINAL WITH SALPINGECTOMY No date: HYSTEROSCOPY  BMI    Body Mass Index: 28.63 kg/m      Reproductive/Obstetrics negative OB ROS                             Anesthesia Physical Anesthesia Plan  ASA: 3  Anesthesia Plan: General ETT   Post-op Pain Management:    Induction: Intravenous  PONV Risk Score and Plan: Ondansetron, Dexamethasone, Midazolam and Treatment may vary due to age or medical condition  Airway Management Planned: Oral ETT  Additional Equipment:   Intra-op Plan:   Post-operative Plan: Extubation in OR  Informed Consent: I have reviewed the patients History and Physical, chart, labs and discussed the procedure including the risks, benefits and alternatives for the proposed anesthesia with  the patient or authorized representative who has indicated his/her understanding and acceptance.   Patient has DNR.  Discussed DNR with patient and Suspend DNR.   Dental Advisory Given  Plan Discussed with: Anesthesiologist, CRNA and Surgeon  Anesthesia Plan Comments: (Patient consented for risks of anesthesia including but not limited to:  - adverse reactions to medications - damage to eyes, teeth, lips or other oral mucosa - nerve damage due to positioning  - sore throat or hoarseness - Damage to heart, brain, nerves, lungs, other parts of body or loss of life  Patient voiced understanding.)       Anesthesia Quick Evaluation

## 2022-08-03 NOTE — Op Note (Signed)
PROCEDURES: 1. Hand assisted Laparoscopic subtotal colectomy With stapled ileocolostomy  Pre-operative Diagnosis: colon stricture and colon bleed  Post-operative Diagnosis: Same  Surgeon: Merri Ray Earlena Werst   Anesthesia: General endotracheal anesthesia  ASA Class: 2  Surgeon: Sterling Big , MD FACS  Anesthesia: Gen. with endotracheal tube   Findings: Stricture located within distal transverse colon terminating on splenic flexure Tension free anastomosis, no evidence of intraop leak and good perfusion Distal margin of resection with nice pliable colon  Estimated Blood Loss: 100cc         Drains: none         Specimens: colon          Complications: none          Procedure Details  The patient was seen again in the Holding Room. The benefits, complications, treatment options, and expected outcomes were discussed with the patient. The risks of bleeding, infection, recurrence of symptoms, failure to resolve symptoms,  bowel injury, any of which could require further surgery were reviewed with the patient.   The patient was taken to Operating Room, identified as Jerrye Bushy and the procedure verified.  A Time Out was held and the above information confirmed.  Prior to the induction of general anesthesia, antibiotic prophylaxis was administered. VTE prophylaxis was in place. General endotracheal anesthesia was then administered and tolerated well. After the induction, the abdomen was prepped with Chloraprep and draped in the sterile fashion. The patient was positioned in the supine position.  7 cm incision was created as a midline mini laparotomy. The abdominal cavity was entered under direct visualization and the GelPort device was placed. two 5 mm ports were placed  under direct visualization and pneumoperitoneum was obtained.  The more dynamic changes were observed. The greater omentum was divided and the hepatic flexure was taken down using harmonic scalpel.  The white line of Toldt  was incised and a lateral to medial dissection was performed.  We identified the right ureter as well as the duodenum and preserve both structures at all times. Were also able to mobilize the attachments of the cecum and terminal ileum.   Upon further inspection the stricture was found on the Distal transverse colon. I placed an additional 5 mm port to takedown the splenic flexure using the harmonic device. Omentum was dissected free from T colon and splenic flexure was completely mobilized, We also mobilized the left colon to have a tension free anastomosis. We identified both middle coli and right colic pedicle and divided both pedicles with 60 mm echelon staples laparoscopically.  Once we had an adequate mobilization were able to remove the GelPort and exteriorized the right colon.  A 10 cm margin on the terminal ileum was identified and we created a window with electrocautery and divided the terminal ileum.  Attention then was turned to the distal excision margin located on descending colon.  We Were able to also use the 60 echelon stapler to divide this area.  The mesentery was scored with electrocautery.  We identified the right colic artery and suture ligated with 2 oh silks in the standard fashion.  The rest of the mesentery was divided using the harmonic scalpel.  Please note that we went as low as possible to the base of the mesentery to obtain adequate lymph nodes and adequate margins of dissection. Specimen was passed and sent to permanent pathology.  A standard side-to-side functional end to end staple anastomosis was created using 60 mm echelon device and the  common channel closed using interrupted 2-0 vicryl and 3-0 v lock as a connel suture.  We check for patency as well as leak.  There was a tension-free anastomosis with good perfusion and no evidence of intraoperative leak.   We changed gloves and place a clean closure tray.   liposomal Marcaine was injected throughout the abdominal wall  on both sides under direct visualization and palpation.  The fascia was closed with a running 0 PDS using the small bite techniques.  Incisions were closed with staples. Sterile dressing applied.  Needle and laparotomy counts were correct and there were no immediate complications     Sterling Big, MD, FACS

## 2022-08-03 NOTE — Anesthesia Procedure Notes (Signed)
Procedure Name: Intubation Date/Time: 08/03/2022 9:45 AM  Performed by: Mathews Argyle, CRNAPre-anesthesia Checklist: Patient identified, Patient being monitored, Timeout performed, Emergency Drugs available and Suction available Patient Re-evaluated:Patient Re-evaluated prior to induction Oxygen Delivery Method: Circle system utilized Preoxygenation: Pre-oxygenation with 100% oxygen Induction Type: IV induction Ventilation: Mask ventilation without difficulty Laryngoscope Size: Mac and 3 Grade View: Grade I Tube type: Oral Tube size: 7.0 mm Number of attempts: 1 Airway Equipment and Method: Stylet Placement Confirmation: ETT inserted through vocal cords under direct vision, positive ETCO2 and breath sounds checked- equal and bilateral Secured at: 20 cm Tube secured with: Tape Dental Injury: Teeth and Oropharynx as per pre-operative assessment

## 2022-08-03 NOTE — Transfer of Care (Signed)
Immediate Anesthesia Transfer of Care Note  Patient: Vanassa D Helwig  Procedure(s) Performed: HAND ASSISTED LAPAROSCOPIC SUBTOTAL COLECTOMY (Right)  Patient Location: PACU  Anesthesia Type:General  Level of Consciousness: drowsy  Airway & Oxygen Therapy: Patient Spontanous Breathing and Patient connected to face mask oxygen  Post-op Assessment: Report given to RN and Post -op Vital signs reviewed and stable  Post vital signs: Reviewed  Last Vitals:  Vitals Value Taken Time  BP 139/87 08/03/22 1316  Temp    Pulse 100 08/03/22 1317  Resp 13 08/03/22 1317  SpO2 100 % 08/03/22 1317  Vitals shown include unvalidated device data.  Last Pain:  Vitals:   08/03/22 0819  TempSrc:   PainSc: 0-No pain      Patients Stated Pain Goal: 0 (08/03/22 0454)  Complications: No notable events documented.

## 2022-08-03 NOTE — Anesthesia Postprocedure Evaluation (Signed)
Anesthesia Post Note  Patient: Darlene Barber  Procedure(s) Performed: HAND ASSISTED LAPAROSCOPIC SUBTOTAL COLECTOMY (Right)  Patient location during evaluation: PACU Anesthesia Type: General Level of consciousness: awake and alert Pain management: pain level controlled Vital Signs Assessment: post-procedure vital signs reviewed and stable Respiratory status: spontaneous breathing, nonlabored ventilation, respiratory function stable and patient connected to nasal cannula oxygen Cardiovascular status: blood pressure returned to baseline and stable Postop Assessment: no apparent nausea or vomiting Anesthetic complications: no   No notable events documented.   Last Vitals:  Vitals:   08/03/22 1706 08/03/22 1950  BP: (!) 148/89 125/75  Pulse: 95 96  Resp: 15 16  Temp: 36.7 C (!) 36.4 C  SpO2: 100% 100%    Last Pain:  Vitals:   08/03/22 1950  TempSrc: Oral  PainSc:                  Cleda Mccreedy Roshan Salamon

## 2022-08-03 NOTE — Progress Notes (Signed)
Progress Note   Patient: Darlene Barber [id number]:[id number] DOB: 06-Aug-1965 DOA: 07/30/2022     4 DOS: the patient was seen and examined on 08/03/2022   Brief hospital course:  57 y.o. female with medical history significant of septic shock admission at [facility] in late December 2023.  That hospitalization was complicated by development of lower GI bleed on April 06, 2022 with subsequent colonoscopy showing multiple large ulcers of the large bowel.  6 patient was subsequently discharged in February 2024.  Unfortunately patient has had recurrent episodes of radiologic diagnosis of colitis as well as finding of lower GI bleed since then.  Most recently patient comes in now with about 2 weeks of rather daily lower GI bleed varying from a single episode in a day to 5 bloody stools in a day associated with generalized abdominal cramps.  No vomiting is reported no fever.   Patient was evaluated at office nephrology today and found to have severe anemia and sent to [facility] ER.  5/22.  Patient seen this morning was on liquid diet.  Consulted GI for evaluation.  Patient complaining of lower abdominal pain and coming around from her back.  Patient received 1 unit of packed red blood cells yesterday and receiving another unit today.  Stool studies ordered.  Colitis showing on CT scan.  Patient with recent C. difficile colitis few months ago. 5/23. Hb up to 8.7.  Still having abdominal soreness, dry mouth and back pain.  Not many BM with prep, so colonoscopy pushed til tomorrow and re-prep today. 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  Gastroenterology recommended surgical evaluation.  General surgery to remove part of the colon tomorrow. 5/25.  Seen this morning prior to going to the operating room.  Hemoglobin 7.6 with gentle fluids given overnight.  Will give 1 unit of packed red blood cells prior to surgery.  Assessment and Plan: * Stricture of ascending colon  [facility]) Operating room today for stricture of the descending colon.  General surgical team started neomycin and oral Flagyl.  Lower GI bleeding Received 2 units of packed red blood cells during the hospital course.  With hemoglobin 7.6 prior to the operating room will give another unit of packed red blood cells today.  Anemia of chronic disease Received 2 units of blood on this hospital stay. Prior ferritin 401 going along with anemia of chronic disease.  With hemoglobin 7.6 today prior to surgery will give another unit of packed red blood cells today.  Metabolic acidosis Likely secondary to chronic kidney disease.  Continue sodium bicarb tablets.  CKD (chronic kidney disease), stage IV (HCC) Last creatinine 2.63 with a GFR of 21  Essential hypertension I discontinued Norvasc during the hospital course.  Hypokalemia Replaced  Depression On Zoloft.  Colitis Colitis ruled out by colonoscopy.  Seeing a stricture instead.  Stool studies negative.         Subjective: Patient seen this morning prior to going to the operating room.  Hemoglobin drifted down to 7.6 with gentle IV fluids overnight.  1 unit of packed red blood cells ordered in hallway prior to going to the operating room.  Physical Exam: Vitals:   08/03/22 0436 08/03/22 0441 08/03/22 0743 08/03/22 0819  BP: (!) 94/58 115/69 (!) 137/90 115/87  Pulse: 81 78 78 81  Resp: 16  20 18   Temp: 98 F (36.7 C)  97.7 F (36.5 C)   TempSrc: Oral  Oral   SpO2: 100%  100%   Weight:      Height:       Physical Exam HENT:     Head: Normocephalic.     Mouth/Throat:     Pharynx: No oropharyngeal exudate.  Eyes:     General: Lids are normal.     Conjunctiva/sclera: Conjunctivae normal.  Cardiovascular:     Rate and Rhythm: Normal rate and regular rhythm.     Heart sounds: Normal heart sounds, S1 normal and S2 normal.  Pulmonary:     Breath sounds: No decreased breath sounds, wheezing, rhonchi or rales.  Abdominal:      Palpations: Abdomen is soft.     Tenderness: There is abdominal tenderness in the right lower quadrant.  Musculoskeletal:     Right lower leg: No swelling.     Left lower leg: No swelling.  Skin:    General: Skin is warm.     Findings: No rash.  Neurological:     Mental Status: She is alert and oriented to person, place, and time.     Data Reviewed: Hemoglobin 7.6, platelets 348, white blood cell count 4.3, GFR 21 creatinine 2.63 with a CO2 of 17  Family Communication: Updated patient's son on the phone this morning  Disposition: Status is: Inpatient Remains inpatient appropriate because: Operating room today for colonic stricture  Planned Discharge Destination: Home with Home Health    Time spent: 28 minutes  Author: Alford Highland, MD 08/03/2022 12:30 PM  For on call review [email or web address].

## 2022-08-04 ENCOUNTER — Encounter: Payer: Self-pay | Admitting: Surgery

## 2022-08-04 DIAGNOSIS — D638 Anemia in other chronic diseases classified elsewhere: Secondary | ICD-10-CM | POA: Diagnosis not present

## 2022-08-04 DIAGNOSIS — E872 Acidosis, unspecified: Secondary | ICD-10-CM | POA: Diagnosis not present

## 2022-08-04 DIAGNOSIS — K56699 Other intestinal obstruction unspecified as to partial versus complete obstruction: Secondary | ICD-10-CM | POA: Diagnosis not present

## 2022-08-04 DIAGNOSIS — K922 Gastrointestinal hemorrhage, unspecified: Secondary | ICD-10-CM | POA: Diagnosis not present

## 2022-08-04 LAB — TYPE AND SCREEN
ABO/RH(D): A POS
Unit division: 0
Unit division: 0
Unit division: 0

## 2022-08-04 LAB — COMPREHENSIVE METABOLIC PANEL
ALT: 12 U/L (ref 0–44)
AST: 15 U/L (ref 15–41)
Albumin: 2.2 g/dL — ABNORMAL LOW (ref 3.5–5.0)
Alkaline Phosphatase: 54 U/L (ref 38–126)
Anion gap: 6 (ref 5–15)
BUN: 19 mg/dL (ref 6–20)
CO2: 19 mmol/L — ABNORMAL LOW (ref 22–32)
Calcium: 7.7 mg/dL — ABNORMAL LOW (ref 8.9–10.3)
Chloride: 112 mmol/L — ABNORMAL HIGH (ref 98–111)
Creatinine, Ser: 2.59 mg/dL — ABNORMAL HIGH (ref 0.44–1.00)
GFR, Estimated: 21 mL/min — ABNORMAL LOW (ref >60)
Glucose, Bld: 116 mg/dL — ABNORMAL HIGH (ref 70–99)
Potassium: 4 mmol/L (ref 3.5–5.1)
Sodium: 137 mmol/L (ref 135–145)
Total Bilirubin: 0.4 mg/dL (ref 0.3–1.2)
Total Protein: 4.6 g/dL — ABNORMAL LOW (ref 6.5–8.1)

## 2022-08-04 LAB — CBC
HCT: 27.9 % — ABNORMAL LOW (ref 36.0–46.0)
Hemoglobin: 9.3 g/dL — ABNORMAL LOW (ref 12.0–15.0)
MCH: 30.8 pg (ref 26.0–34.0)
MCHC: 33.3 g/dL (ref 30.0–36.0)
MCV: 92.4 fL (ref 80.0–100.0)
Platelets: 360 10*3/uL (ref 150–400)
RBC: 3.02 MIL/uL — ABNORMAL LOW (ref 3.87–5.11)
RDW: 17 % — ABNORMAL HIGH (ref 11.5–15.5)
WBC: 13.2 10*3/uL — ABNORMAL HIGH (ref 4.0–10.5)
nRBC: 0 % (ref 0.0–0.2)

## 2022-08-04 LAB — BPAM RBC
Blood Product Expiration Date: 202406202359
Blood Product Expiration Date: 202406202359
Blood Product Expiration Date: 202406202359
ISSUE DATE / TIME: 202405250747
Unit Type and Rh: 6200
Unit Type and Rh: 6200

## 2022-08-04 LAB — PREPARE RBC (CROSSMATCH)

## 2022-08-04 LAB — MAGNESIUM: Magnesium: 1.2 mg/dL — ABNORMAL LOW (ref 1.7–2.4)

## 2022-08-04 MED ORDER — FAMOTIDINE 20 MG PO TABS
10.0000 mg | ORAL_TABLET | Freq: Once | ORAL | Status: AC
  Administered 2022-08-04: 10 mg via ORAL
  Filled 2022-08-04: qty 1

## 2022-08-04 MED ORDER — MAGNESIUM SULFATE 4 GM/100ML IV SOLN
4.0000 g | Freq: Once | INTRAVENOUS | Status: AC
  Administered 2022-08-04: 4 g via INTRAVENOUS
  Filled 2022-08-04: qty 100

## 2022-08-04 MED ORDER — SODIUM CHLORIDE 0.9 % IV SOLN: INTRAVENOUS | Status: DC | PRN

## 2022-08-04 NOTE — Progress Notes (Signed)
CC: POD # 1 Subjective: Doing well considering No flatus AVSS Labs ok, hb w good response to perioperative transfusion  Objective: Vital signs in last 24 hours: Temp:  [97.5 F (36.4 C)-98.3 F (36.8 C)] 98.3 F (36.8 C) (05/26 0850) Pulse Rate:  [95-101] 100 (05/26 0850) Resp:  [11-20] 18 (05/26 0850) BP: (87-148)/(59-91) 98/78 (05/26 0850) SpO2:  [96 %-100 %] 100 % (05/26 0850) Last BM Date : 08/04/22  Intake/Output from previous day: 05/25 0701 - 05/26 0700 In: 6596.7 [I.V.:2400; Blood:3986.7; IV Piggyback:210] Out: 1020 [Urine:1000; Blood:20] Intake/Output this shift: No intake/output data recorded.  Physical exam:  NAD alert Abd: soft, dressing intact, no peritonitis or infection  Lab Results: CBC  Recent Labs    08/03/22 0415 08/04/22 0416  WBC 4.3 13.2*  HGB 7.6* 9.3*  HCT 23.2* 27.9*  PLT 348 360   BMET Recent Labs    08/03/22 0415 08/04/22 0416  NA 138 137  K 3.7 4.0  CL 114* 112*  CO2 17* 19*  GLUCOSE 86 116*  BUN 21* 19  CREATININE 2.63* 2.59*  CALCIUM 8.2* 7.7*   PT/INR No results for input(s): "LABPROT", "INR" in the last 72 hours. ABG No results for input(s): "PHART", "HCO3" in the last 72 hours.  Invalid input(s): "PCO2", "PO2"  Studies/Results: No results found.  Anti-infectives: Anti-infectives (From admission, onward)    Start     Dose/Rate Route Frequency Ordered Stop   08/03/22 1400  cefoTEtan (CEFOTAN) 2 g in sodium chloride 0.9 % 100 mL IVPB        2 g 200 mL/hr over 30 Minutes Intravenous Every 12 hours 08/03/22 1340 08/04/22 0959   08/03/22 1000  metroNIDAZOLE (FLAGYL) IVPB 500 mg  Status:  Discontinued        500 mg 100 mL/hr over 60 Minutes Intravenous Every 12 hours 08/02/22 1251 08/02/22 1319   08/03/22 1000  metroNIDAZOLE (FLAGYL) IVPB 500 mg        500 mg 100 mL/hr over 60 Minutes Intravenous Once 08/03/22 0951 08/03/22 1100   08/02/22 1400  neomycin (MYCIFRADIN) tablet 1,000 mg        1,000 mg Oral Every 8  hours 08/02/22 1248 08/02/22 2132   08/02/22 1400  metroNIDAZOLE (FLAGYL) tablet 500 mg        500 mg Oral Every 8 hours 08/02/22 1248 08/02/22 2132   07/31/22 2200  cefTRIAXone (ROCEPHIN) 1 g in sodium chloride 0.9 % 100 mL IVPB  Status:  Discontinued        1 g 200 mL/hr over 30 Minutes Intravenous Every 24 hours 07/30/22 2340 08/02/22 1319   07/31/22 1000  acyclovir (ZOVIRAX) 200 MG capsule 400 mg        400 mg Oral 2 times daily 07/31/22 0435     07/31/22 0700  metroNIDAZOLE (FLAGYL) IVPB 500 mg  Status:  Discontinued        500 mg 100 mL/hr over 60 Minutes Intravenous Every 8 hours 07/30/22 2340 08/02/22 1251   07/30/22 2215  cefTRIAXone (ROCEPHIN) 2 g in sodium chloride 0.9 % 100 mL IVPB        2 g 200 mL/hr over 30 Minutes Intravenous  Once 07/30/22 2214 07/30/22 2306   07/30/22 2215  metroNIDAZOLE (FLAGYL) IVPB 500 mg        500 mg 100 mL/hr over 60 Minutes Intravenous  Once 07/30/22 2214 07/31/22 0020       Assessment/Plan:  Keep cld, Mobilize Dc foley No evidence of complications  Sterling Big, MD, [facility]  08/04/2022

## 2022-08-04 NOTE — Assessment & Plan Note (Signed)
Replaced during hospital course.  Now that she is back on a regular diet this should not be an issue

## 2022-08-04 NOTE — Progress Notes (Signed)
Mobility Specialist - Progress Note    08/04/22 1008  Mobility  Activity Ambulated with assistance in room  Level of Assistance Minimal assist, patient does 75% or more  Assistive Device Front wheel walker  Distance Ambulated (ft) 10 ft  Range of Motion/Exercises Active  Activity Response Tolerated fair  Mobility Referral Yes  $Mobility charge 1 Mobility  Mobility Specialist Start Time (ACUTE ONLY) 0947  Mobility Specialist Stop Time (ACUTE ONLY) 1008  Mobility Specialist Time Calculation (min) (ACUTE ONLY) 21 min   Pt resting in bed on RA upon entry. Pt STS and ambulates to bathroom MinA (due to safety) RN Notified of heavy shaking during ambulation. Pt returned to recliner and wheeled back to corner beside bed by RN. Pt left with needs in reach.   Johnathan Hausen Mobility Specialist 08/04/22, 10:12 AM

## 2022-08-04 NOTE — Evaluation (Signed)
Physical Therapy Evaluation Patient Details Name: Darlene Barber MRN: [id number] DOB: 08-Apr-1965 Today's Date: 08/04/2022  History of Present Illness  Pt is a 57 y/o female admitted secondary to severe anemia and rectal bleeding. Pt found to have lower GIB and a colon stricture s/p subtotal colectomy on 5/25. PMH including but not limited to septic shock admission at [facility] in late December 2023 complicated by development of lower GI bleed on April 06, 2022 with subsequent colonoscopy showing multiple large ulcers of the large bowel.   Clinical Impression  Pt presented supine in bed with HOB elevated, awake and willing to participate in therapy session. Prior to admission, pt reported that she ambulated with use of a RW and was independent with ADLs. At the time of evaluation, pt limited with mobility secondary to abdominal pain. She was able to complete bed mobility with supervision and transfers with min-mod A. With standing and transfers, pt demonstrated significant instability and ataxic-like movements, which she reported is new since having her procedure done on 5/25 (unsure of the etiology). Will plan for a second person to safely attempt gait training at next session if appropriate. PT recommending further intensive therapy services for pt upon d/c prior to returning home with family support to maximize her safety and independence with functional mobility. Pt would continue to benefit from skilled physical therapy services at this time while admitted and after d/c to address the below listed limitations in order to improve overall safety and independence with functional mobility.        Recommendations for follow up therapy are one component of a multi-disciplinary discharge planning process, led by the attending physician.  Recommendations may be updated based on patient status, additional functional criteria and insurance authorization.  Follow Up Recommendations Can patient  physically be transported by private vehicle: No     Assistance Recommended at Discharge Frequent or constant Supervision/Assistance  Patient can return home with the following  Two people to help with walking and/or transfers;A lot of help with bathing/dressing/bathroom;Assistance with cooking/housework;Assist for transportation;Help with stairs or ramp for entrance    Equipment Recommendations Other (comment) (defer to next venue of care)  Recommendations for Other Services       Functional Status Assessment Patient has had a recent decline in their functional status and demonstrates the ability to make significant improvements in function in a reasonable and predictable amount of time.     Precautions / Restrictions Precautions Precautions: Fall Restrictions Weight Bearing Restrictions: No      Mobility  Bed Mobility Overal bed mobility: Needs Assistance Bed Mobility: Supine to Sit, Sit to Supine     Supine to sit: Supervision Sit to supine: Supervision   General bed mobility comments: increased time and effort, pain with movement    Transfers Overall transfer level: Needs assistance Equipment used: 1 person hand held assist Transfers: Sit to/from Stand, Bed to chair/wheelchair/BSC Sit to Stand: Min assist Stand pivot transfers: Min assist, Mod assist         General transfer comment: pt able to complete sit<>stand transfer x2 from EOB and x1 from [facility] with min A for stability. She required min-mod A with pivotal steps bed<>BSC due to increased instability and ataxic type movements    Ambulation/Gait               General Gait Details: unsafe to complete this date with one person; pt was very unsteady and had ataxic type movements in standing and with transfers  Stairs  Wheelchair Mobility    Modified Rankin (Stroke Patients Only)       Balance Overall balance assessment: Needs assistance Sitting-balance support: Feet  supported Sitting balance-Leahy Scale: Good     Standing balance support: During functional activity, Single extremity supported, Bilateral upper extremity supported Standing balance-Leahy Scale: Poor                               Pertinent Vitals/Pain Pain Assessment Pain Assessment: Faces Faces Pain Scale: Hurts even more Pain Location: abdomen Pain Descriptors / Indicators: Grimacing, Guarding, Sore Pain Intervention(s): Monitored during session, Repositioned    Home Living Family/patient expects to be discharged to:: Private residence Living Arrangements: Alone Available Help at Discharge: Family;Available 24 hours/day Type of Home: Apartment Home Access: Level entry       Home Layout: One level Home Equipment: Grab bars - tub/shower;Cane - quad      Prior Function Prior Level of Function : Independent/Modified Independent;History of Falls (last six months)             Mobility Comments: Ambulatory with RW for household distances       Hand Dominance        Extremity/Trunk Assessment   Upper Extremity Assessment Upper Extremity Assessment: Generalized weakness    Lower Extremity Assessment Lower Extremity Assessment: Generalized weakness       Communication   Communication: No difficulties  Cognition Arousal/Alertness: Awake/alert Behavior During Therapy: WFL for tasks assessed/performed Overall Cognitive Status: Within Functional Limits for tasks assessed                                          General Comments      Exercises     Assessment/Plan    PT Assessment Patient needs continued PT services  PT Problem List Decreased strength;Decreased balance;Decreased mobility;Decreased coordination;Decreased knowledge of use of DME;Decreased safety awareness;Decreased knowledge of precautions;Pain       PT Treatment Interventions DME instruction;Gait training;Stair training;Functional mobility  training;Therapeutic activities;Therapeutic exercise;Balance training;Neuromuscular re-education;Patient/family education    PT Goals (Current goals can be found in the Care Plan section)  Acute Rehab PT Goals Patient Stated Goal: decrease pain PT Goal Formulation: With patient Time For Goal Achievement: 08/18/22 Potential to Achieve Goals: Good    Frequency Min 4X/week     Co-evaluation               AM-PAC PT "6 Clicks" Mobility  Outcome Measure Help needed turning from your back to your side while in a flat bed without using bedrails?: A Little Help needed moving from lying on your back to sitting on the side of a flat bed without using bedrails?: A Little Help needed moving to and from a bed to a chair (including a wheelchair)?: A Little Help needed standing up from a chair using your arms (e.g., wheelchair or bedside chair)?: A Little Help needed to walk in hospital room?: A Lot Help needed climbing 3-5 steps with a railing? : Total 6 Click Score: 15    End of Session   Activity Tolerance: Patient tolerated treatment well Patient left: in bed;with call bell/phone within reach Nurse Communication: Mobility status PT Visit Diagnosis: Unsteadiness on feet (R26.81);Other abnormalities of gait and mobility (R26.89)    Time: 1610-9604 PT Time Calculation (min) (ACUTE ONLY): 19 min   Charges:  PT Evaluation $PT Eval Moderate Complexity: 1 Mod          Ginette Pitman, PT, DPT  Acute Rehabilitation Services Office [phone]   Alessandra Bevels Rylie Limburg 08/04/2022, 2:18 PM

## 2022-08-04 NOTE — Progress Notes (Signed)
Progress Note   Patient: Darlene Barber [id number]:[id number] DOB: [birth year] DOA: 07/30/2022     5 DOS: the patient was seen and examined on 08/04/2022   Brief hospital course:  57 y.o. female with medical history significant of septic shock admission at [facility] in late December 2023.  That hospitalization was complicated by development of lower GI bleed on April 06, 2022 with subsequent colonoscopy showing multiple large ulcers of the large bowel.  6 patient was subsequently discharged in February 2024.  Unfortunately patient has had recurrent episodes of radiologic diagnosis of colitis as well as finding of lower GI bleed since then.  Most recently patient comes in now with about 2 weeks of rather daily lower GI bleed varying from a single episode in a day to 5 bloody stools in a day associated with generalized abdominal cramps.  No vomiting is reported no fever.   Patient was evaluated at office nephrology today and found to have severe anemia and sent to [facility] ER.  5/22.  Patient seen this morning was on liquid diet.  Consulted GI for evaluation.  Patient complaining of lower abdominal pain and coming around from her back.  Patient received 1 unit of packed red blood cells yesterday and receiving another unit today.  Stool studies ordered.  Colitis showing on CT scan.  Patient with recent C. difficile colitis few months ago. 5/23. Hb up to 8.7.  Still having abdominal soreness, dry mouth and back pain.  Not many BM with prep, so colonoscopy pushed til tomorrow and re-prep today. 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  Gastroenterology recommended surgical evaluation.  General surgery to remove part of the colon tomorrow. 5/25.  Seen this morning prior to going to the operating room.  Hemoglobin 7.6 with gentle fluids given overnight.  Will give 1 unit of packed red blood cells prior to surgery. 5/26.  Postoperative day 1 for laparoscopic subtotal  colectomy.  Replacing IV magnesium.  Physical therapy evaluation.  Currently on clear liquid diet.  Assessment and Plan: * Colon stricture [facility]) Operating room on 5/25 for subtotal colectomy.  Pain control.  Lower GI bleeding Received 3 units of packed red blood cells during this hospital course.  Good response to last transfusion with hemoglobin up to 9.3.  Anemia of chronic disease Received 3 units of blood on this hospital stay. Prior ferritin 401 going along with anemia of chronic disease.  Last hemoglobin 9.3  Metabolic acidosis Likely secondary to chronic kidney disease.  Continue sodium bicarb tablets.  CKD (chronic kidney disease), stage IV (HCC) Last creatinine 2.59 with a GFR of 21  Essential hypertension I discontinued Norvasc during the hospital course.  Hypokalemia Replaced  Depression On Zoloft.  Colitis Colitis ruled out by colonoscopy.  Seeing a stricture instead.  Stool studies negative.   Hypomagnesemia Replace 4 g IV magnesium today.  Recheck level tomorrow.        Subjective: Patient with pain this morning.  Postoperative day 1 for colon stricture removal with subtotal colectomy.  No nausea or vomiting.  Tolerated liquids.  Physical Exam: Vitals:   08/03/22 1950 08/04/22 0422 08/04/22 0757 08/04/22 0850  BP: 125/75 (!) 95/59 (!) 87/73 98/78  Pulse: 96 97 (!) 101 100  Resp: 16 20 16 18   Temp: (!) 97.5 F (36.4 C) 97.7 F (36.5 C) 98 F (36.7 C) 98.3 F (36.8 C)  TempSrc: Oral  Oral Oral  SpO2: 100% 100% 100% 100%  Weight:  Height:       Physical Exam HENT:     Head: Normocephalic.     Mouth/Throat:     Pharynx: No oropharyngeal exudate.  Eyes:     General: Lids are normal.     Conjunctiva/sclera: Conjunctivae normal.  Cardiovascular:     Rate and Rhythm: Normal rate and regular rhythm.     Heart sounds: Normal heart sounds, S1 normal and S2 normal.  Pulmonary:     Breath sounds: No decreased breath sounds, wheezing, rhonchi  or rales.  Abdominal:     Palpations: Abdomen is soft.     Tenderness: There is generalized abdominal tenderness.  Musculoskeletal:     Right lower leg: No swelling.     Left lower leg: No swelling.  Skin:    General: Skin is warm.     Findings: No rash.  Neurological:     Mental Status: She is alert and oriented to person, place, and time.     Data Reviewed: Creatinine 2.59, CO2 19, magnesium 1.2, albumin 2.2, hemoglobin 9.3, white blood cell count 13.2  Family Communication: Updated son on the phone  Disposition: Status is: Inpatient Remains inpatient appropriate because: Postoperative day 1 for colon stricture removal.  Planned Discharge Destination: Home with Home Health    Time spent: 28 minutes Case discussed with nursing staff and general surgery.  Author: Alford Highland, MD 08/04/2022 2:06 PM  For on call review [email or web address].

## 2022-08-04 NOTE — Progress Notes (Signed)
Patient is requesting to take her Bydureon/exenatide injection that she takes weekly at home on Sunday. Dr Renae Gloss informed and said this med is not given in the hospital. Patient updated

## 2022-08-05 DIAGNOSIS — E872 Acidosis, unspecified: Secondary | ICD-10-CM | POA: Diagnosis not present

## 2022-08-05 DIAGNOSIS — D638 Anemia in other chronic diseases classified elsewhere: Secondary | ICD-10-CM | POA: Diagnosis not present

## 2022-08-05 DIAGNOSIS — K922 Gastrointestinal hemorrhage, unspecified: Secondary | ICD-10-CM | POA: Diagnosis not present

## 2022-08-05 DIAGNOSIS — K56699 Other intestinal obstruction unspecified as to partial versus complete obstruction: Secondary | ICD-10-CM | POA: Diagnosis not present

## 2022-08-05 LAB — BASIC METABOLIC PANEL
Anion gap: 5 (ref 5–15)
BUN: 18 mg/dL (ref 6–20)
CO2: 19 mmol/L — ABNORMAL LOW (ref 22–32)
Calcium: 8 mg/dL — ABNORMAL LOW (ref 8.9–10.3)
Chloride: 113 mmol/L — ABNORMAL HIGH (ref 98–111)
Creatinine, Ser: 2.69 mg/dL — ABNORMAL HIGH (ref 0.44–1.00)
GFR, Estimated: 20 mL/min — ABNORMAL LOW (ref >60)
Glucose, Bld: 97 mg/dL (ref 70–99)
Potassium: 3.6 mmol/L (ref 3.5–5.1)
Sodium: 137 mmol/L (ref 135–145)

## 2022-08-05 LAB — OVA + PARASITE EXAM

## 2022-08-05 LAB — CBC
HCT: 29.2 % — ABNORMAL LOW (ref 36.0–46.0)
Hemoglobin: 9.5 g/dL — ABNORMAL LOW (ref 12.0–15.0)
MCH: 30.7 pg (ref 26.0–34.0)
MCHC: 32.5 g/dL (ref 30.0–36.0)
MCV: 94.5 fL (ref 80.0–100.0)
Platelets: 342 10*3/uL (ref 150–400)
RBC: 3.09 MIL/uL — ABNORMAL LOW (ref 3.87–5.11)
RDW: 17.2 % — ABNORMAL HIGH (ref 11.5–15.5)
WBC: 11.5 10*3/uL — ABNORMAL HIGH (ref 4.0–10.5)
nRBC: 0 % (ref 0.0–0.2)

## 2022-08-05 LAB — MAGNESIUM: Magnesium: 1.7 mg/dL (ref 1.7–2.4)

## 2022-08-05 LAB — O&P RESULT

## 2022-08-05 MED ORDER — MAGNESIUM SULFATE 2 GM/50ML IV SOLN
2.0000 g | Freq: Once | INTRAVENOUS | Status: AC
  Administered 2022-08-05: 2 g via INTRAVENOUS
  Filled 2022-08-05: qty 50

## 2022-08-05 MED ORDER — OXYCODONE HCL 5 MG PO TABS
5.0000 mg | ORAL_TABLET | ORAL | Status: DC | PRN
  Administered 2022-08-05 – 2022-08-06 (×4): 10 mg via ORAL
  Filled 2022-08-05 (×4): qty 2

## 2022-08-05 MED ORDER — PREGABALIN 25 MG PO CAPS
25.0000 mg | ORAL_CAPSULE | Freq: Three times a day (TID) | ORAL | Status: DC
  Administered 2022-08-05 – 2022-08-06 (×2): 25 mg via ORAL
  Filled 2022-08-05 (×2): qty 1

## 2022-08-05 MED ORDER — MORPHINE SULFATE (PF) 2 MG/ML IV SOLN: 2.0000 mg | INTRAVENOUS | Status: DC | PRN

## 2022-08-05 NOTE — Progress Notes (Signed)
08/05/2022  Subjective: Patient is 2 Days Post-Op s/p laparoscopic hand assisted subtotal colectomy.  No acute events overnight.  Patient having flatus and had a BM this morning.  Tolerating clear liquids without issues.  Pain controlled.  Vital signs: Temp:  [97.7 F (36.5 C)-98.2 F (36.8 C)] 98.1 F (36.7 C) (05/27 0805) Pulse Rate:  [90-105] 90 (05/27 0805) Resp:  [16-18] 18 (05/27 0805) BP: (101-131)/(69-93) 103/69 (05/27 0805) SpO2:  [98 %-100 %] 100 % (05/27 0805) Weight:  [67.6 kg] 67.6 kg (05/27 0400)   Intake/Output: 05/26 0701 - 05/27 0700 In: -  Out: 650 [Urine:650] Last BM Date : 08/04/22  Physical Exam: Constitutional: No acute distress Abdomen:  soft, non-distended, appropriately sore to palpation.  Incisions clean, dry, intact with staples in place.  Honeycomb dressings removed.  Labs:  Recent Labs    08/04/22 0416 08/05/22 0422  WBC 13.2* 11.5*  HGB 9.3* 9.5*  HCT 27.9* 29.2*  PLT 360 342   Recent Labs    08/04/22 0416 08/05/22 0422  NA 137 137  K 4.0 3.6  CL 112* 113*  CO2 19* 19*  GLUCOSE 116* 97  BUN 19 18  CREATININE 2.59* 2.69*  CALCIUM 7.7* 8.0*   No results for input(s): "LABPROT", "INR" in the last 72 hours.  Imaging: No results found.  Assessment/Plan: This is a 57 y.o. female s/p laparoscopic hand assisted subtotal colectomy.  --Patient is doing well and has regained bowel function.  Will advance her to full liquids for lunch and soft diet for dinner if she tolerates well. --Potentially may be able to d/c tomorrow.   Howie Ill, MD [facility]

## 2022-08-05 NOTE — TOC Progression Note (Addendum)
Transition of Care [facility]) - Progression Note    Patient Details  Name: Darlene Barber MRN: [id number] Date of Birth: 01/08/1966  Transition of Care [facility]) CM/SW Contact  Margarito Liner, LCSW Phone Number: 08/05/2022, 11:43 AM  Clinical Narrative:  CSW met with patient. She was ambulatory in the room, using furniture to steady herself. Discussed CIR vs SNF. She prefers home health or outpatient PT at [facility]. Will start home health search.    3:07: [facility], [facility], and [facility] are unable to accept referral. Left messages for [facility] and [facility].  Expected Discharge Plan: Home/Self Care Barriers to Discharge: Continued Medical Work up  Expected Discharge Plan and Services       Living arrangements for the past 2 months: Single Family Home                 DME Arranged: Gilmer Mor DME Agency: [employer] Date DME Agency Contacted: 08/02/22   Representative spoke with at DME Agency: Barbara Cower             Social Determinants of Health (SDOH) Interventions SDOH Screenings   Food Insecurity: No Food Insecurity (07/31/2022)  Housing: Low Risk  (07/31/2022)  Transportation Needs: No Transportation Needs (07/31/2022)  Utilities: Not At Risk (07/31/2022)  Depression (PHQ2-9): High Risk (09/03/2021)  Tobacco Use: Medium Risk (08/04/2022)    Readmission Risk Interventions    08/02/2022    2:06 PM 03/11/2022   12:18 PM  Readmission Risk Prevention Plan  Transportation Screening Complete Complete  Medication Review Oceanographer) Complete Complete  PCP or Specialist appointment within 3-5 days of discharge Complete Complete  HRI or Home Care Consult Complete Complete  SW Recovery Care/Counseling Consult Complete Complete  Palliative Care Screening Not Applicable Not Applicable  Skilled Nursing Facility Complete Not Applicable

## 2022-08-05 NOTE — Progress Notes (Signed)
Progress Note   Patient: Darlene Barber ZOX:[id number] DOB: 1965/07/28 DOA: 07/30/2022     6 DOS: the patient was seen and examined on 08/05/2022   Brief hospital course:  57 y.o. female with medical history significant of septic shock admission at [facility] in late December 2023.  That hospitalization was complicated by development of lower GI bleed on April 06, 2022 with subsequent colonoscopy showing multiple large ulcers of the large bowel.  6 patient was subsequently discharged in February 2024.  Unfortunately patient has had recurrent episodes of radiologic diagnosis of colitis as well as finding of lower GI bleed since then.  Most recently patient comes in now with about 2 weeks of rather daily lower GI bleed varying from a single episode in a day to 5 bloody stools in a day associated with generalized abdominal cramps.  No vomiting is reported no fever.   Patient was evaluated at office nephrology today and found to have severe anemia and sent to [facility] ER.  5/22.  Patient seen this morning was on liquid diet.  Consulted GI for evaluation.  Patient complaining of lower abdominal pain and coming around from her back.  Patient received 1 unit of packed red blood cells yesterday and receiving another unit today.  Stool studies ordered.  Colitis showing on CT scan.  Patient with recent C. difficile colitis few months ago. 5/23. Hb up to 8.7.  Still having abdominal soreness, dry mouth and back pain.  Not many BM with prep, so colonoscopy pushed til tomorrow and re-prep today. 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  Gastroenterology recommended surgical evaluation.  General surgery to remove part of the colon tomorrow. 5/25.  Seen this morning prior to going to the operating room.  Hemoglobin 7.6 with gentle fluids given overnight.  Will give 1 unit of packed red blood cells prior to surgery. 5/26.  Postoperative day 1 for laparoscopic subtotal  colectomy.  Replacing IV magnesium.  Physical therapy evaluation.  Currently on clear liquid diet. 5/27.  Postoperative day 2.  General surgery advance diet to full liquid diet for lunch and soft diet for dinner.  Patient had bowel movements.  Assessment and Plan: * Colon stricture [facility]) Operating room on 5/25 for subtotal colectomy.  Pain control.  Patient stated she had bowel movement postsurgery.  General surgery team advancing diet today.  Lower GI bleeding Received 3 units of packed red blood cells during this hospital course.  Good response to last transfusion with hemoglobin up to 9.5.  Anemia of chronic disease Received 3 units of blood on this hospital stay. Prior ferritin 401 going along with anemia of chronic disease.  Last hemoglobin 9.5.  Metabolic acidosis Likely secondary to chronic kidney disease.  Continue sodium bicarb tablets.  CKD (chronic kidney disease), stage IV (HCC) Last creatinine 2.69 with a GFR of 20  Essential hypertension I discontinued Norvasc during the hospital course.  Hypokalemia Replaced  Depression On Zoloft.  Colitis Colitis ruled out by colonoscopy.  Seeing a stricture instead.  Stool studies negative.   Hypomagnesemia Replace 4 g IV magnesium yesterday. Will give 2 gm IV today.        Subjective: Patient feels abdominal soreness.  Stated she had bowel movements postoperatively.  No nausea or vomiting.  General surgery advancing diet today.  Physical Exam: Vitals:   08/04/22 1938 08/05/22 0357 08/05/22 0400 08/05/22 0805  BP: 101/79 (!) 120/93  103/69  Pulse: 95 99  90  Resp: 16 18  18   Temp: 98.2 F (36.8 C) 97.7 F (36.5 C)  98.1 F (36.7 C)  TempSrc: Oral Oral    SpO2: 100% 98%  100%  Weight:   67.6 kg   Height:       Physical Exam HENT:     Head: Normocephalic.     Mouth/Throat:     Pharynx: No oropharyngeal exudate.  Eyes:     General: Lids are normal.     Conjunctiva/sclera: Conjunctivae normal.   Cardiovascular:     Rate and Rhythm: Normal rate and regular rhythm.     Heart sounds: Normal heart sounds, S1 normal and S2 normal.  Pulmonary:     Breath sounds: No decreased breath sounds, wheezing, rhonchi or rales.  Abdominal:     Palpations: Abdomen is soft.     Tenderness: There is generalized abdominal tenderness.  Musculoskeletal:     Right lower leg: No swelling.     Left lower leg: No swelling.  Skin:    General: Skin is warm.     Findings: No rash.  Neurological:     Mental Status: She is alert and oriented to person, place, and time.     Data Reviewed: Creatinine 2.69, magnesium 1.7, hemoglobin 9.5, white blood cell count 11.7  Family Communication: Updated son on the phone  Disposition: Status is: Inpatient Remains inpatient appropriate because: Postoperative day 2.  General surgery advancing diet today  Planned Discharge Destination: I think patient prefer home health or outpatient [facility] PT rather than SNF or acute rehab.  Will see how she does with next physical therapy session.    Time spent: 28 minutes  Author: Alford Highland, MD 08/05/2022 2:22 PM  For on call review [email or web address].

## 2022-08-05 NOTE — Progress Notes (Signed)
Inpatient Rehab Admissions Coordinator:  ° °Patient was screened for CIR candidacy by Felissa Blouch, MS, CCC-SLP. At this time, Pt. Appears to be a a potential candidate for CIR. I will place  order for rehab consult per protocol for full assessment. Please contact me any with questions. ° °Darlene Sanko, MS, CCC-SLP °Rehab Admissions Coordinator  °[phone] (celll) °[phone] (office) ° °

## 2022-08-06 ENCOUNTER — Encounter: Payer: Self-pay | Admitting: Gastroenterology

## 2022-08-06 DIAGNOSIS — D638 Anemia in other chronic diseases classified elsewhere: Secondary | ICD-10-CM | POA: Diagnosis not present

## 2022-08-06 DIAGNOSIS — K922 Gastrointestinal hemorrhage, unspecified: Secondary | ICD-10-CM | POA: Diagnosis not present

## 2022-08-06 DIAGNOSIS — K56699 Other intestinal obstruction unspecified as to partial versus complete obstruction: Secondary | ICD-10-CM | POA: Diagnosis not present

## 2022-08-06 DIAGNOSIS — E872 Acidosis, unspecified: Secondary | ICD-10-CM | POA: Diagnosis not present

## 2022-08-06 LAB — BASIC METABOLIC PANEL
Anion gap: 6 (ref 5–15)
BUN: 19 mg/dL (ref 6–20)
CO2: 20 mmol/L — ABNORMAL LOW (ref 22–32)
Calcium: 8.3 mg/dL — ABNORMAL LOW (ref 8.9–10.3)
Chloride: 116 mmol/L — ABNORMAL HIGH (ref 98–111)
Creatinine, Ser: 2.61 mg/dL — ABNORMAL HIGH (ref 0.44–1.00)
GFR, Estimated: 21 mL/min — ABNORMAL LOW (ref >60)
Glucose, Bld: 97 mg/dL (ref 70–99)
Potassium: 4.1 mmol/L (ref 3.5–5.1)
Sodium: 142 mmol/L (ref 135–145)

## 2022-08-06 LAB — GLUCOSE, CAPILLARY: Glucose-Capillary: 60 mg/dL — ABNORMAL LOW (ref 70–99)

## 2022-08-06 LAB — CBC
HCT: 26.5 % — ABNORMAL LOW (ref 36.0–46.0)
Hemoglobin: 8.7 g/dL — ABNORMAL LOW (ref 12.0–15.0)
MCH: 30.7 pg (ref 26.0–34.0)
MCHC: 32.8 g/dL (ref 30.0–36.0)
MCV: 93.6 fL (ref 80.0–100.0)
Platelets: 344 10*3/uL (ref 150–400)
RBC: 2.83 MIL/uL — ABNORMAL LOW (ref 3.87–5.11)
RDW: 17 % — ABNORMAL HIGH (ref 11.5–15.5)
WBC: 9.9 10*3/uL (ref 4.0–10.5)
nRBC: 0 % (ref 0.0–0.2)

## 2022-08-06 LAB — SURGICAL PATHOLOGY

## 2022-08-06 MED ORDER — OXYCODONE HCL 5 MG PO TABS: 5.0000 mg | ORAL_TABLET | Freq: Four times a day (QID) | ORAL | 0 refills | Status: AC | PRN

## 2022-08-06 MED ORDER — ACETAMINOPHEN 500 MG PO TABS: 1000.0000 mg | ORAL_TABLET | Freq: Four times a day (QID) | ORAL | 0 refills | Status: DC

## 2022-08-06 MED ORDER — ALPRAZOLAM 0.25 MG PO TABS: 0.2500 mg | ORAL_TABLET | Freq: Three times a day (TID) | ORAL | 0 refills | Status: DC | PRN

## 2022-08-06 MED ORDER — SODIUM BICARBONATE 650 MG PO TABS: 650.0000 mg | ORAL_TABLET | Freq: Three times a day (TID) | ORAL | 0 refills | Status: DC

## 2022-08-06 NOTE — Progress Notes (Signed)
[facility] SURGICAL PROGRESS NOTE  Hospital Day(s): 7.   Post op day(s): 3 Days Post-Op.   Interval History:  Patient seen and examined No acute events or new complaints overnight.  Patient reports abdominal soreness but otherwise doing well No fever, chills, nausea, emesis  WBC normalized; now 9.9K Renal function remains at baseline; sCr - 2.61; UO - 500 ccs No electrolyte derangements  She is on Soft Diet + bowel function   Vital signs in last 24 hours: [min-max] current  Temp:  [98.1 F (36.7 C)-99.9 F (37.7 C)] 98.2 F (36.8 C) (05/28 0530) Pulse Rate:  [90-107] 107 (05/28 0530) Resp:  [16-20] 20 (05/28 0530) BP: (103-113)/(69-79) 106/79 (05/28 0530) SpO2:  [98 %-100 %] 98 % (05/28 0530)     Height: 4\' 10"  (147.3 cm) Weight: 67.6 kg BMI (Calculated): 31.16   Intake/Output last 2 shifts:  05/27 0701 - 05/28 0700 In: 960 [P.O.:960] Out: 500 [Urine:500]   Physical Exam:  Constitutional: alert, cooperative and no distress  Respiratory: breathing non-labored at rest  Cardiovascular: regular rate and sinus rhythm  Gastrointestinal: soft, incisional soreness, and non-distended, no rebound/guarding Integumentary: Mini-laparotomy and laparoscopic incisions are CDI with staples; no erythema or drainage   Labs:     Latest Ref Rng & Units 08/06/2022    4:52 AM 08/05/2022    4:22 AM 08/04/2022    4:16 AM  CBC  WBC 4.0 - 10.5 K/uL 9.9  11.5  13.2   Hemoglobin 12.0 - 15.0 g/dL 8.7  9.5  9.3   Hematocrit 36.0 - 46.0 % 26.5  29.2  27.9   Platelets 150 - 400 K/uL 344  342  360       Latest Ref Rng & Units 08/06/2022    4:52 AM 08/05/2022    4:22 AM 08/04/2022    4:16 AM  CMP  Glucose 70 - 99 mg/dL 97  97  119   BUN 6 - 20 mg/dL 19  18  19    Creatinine 0.44 - 1.00 mg/dL 1.47  8.29  5.62   Sodium 135 - 145 mmol/L 142  137  137   Potassium 3.5 - 5.1 mmol/L 4.1  3.6  4.0   Chloride 98 - 111 mmol/L 116  113  112   CO2 22 - 32 mmol/L 20  19  19    Calcium 8.9  - 10.3 mg/dL 8.3  8.0  7.7   Total Protein 6.5 - 8.1 g/dL   4.6   Total Bilirubin 0.3 - 1.2 mg/dL   0.4   Alkaline Phos 38 - 126 U/L   54   AST 15 - 41 U/L   15   ALT 0 - 44 U/L   12     Imaging studies: No new pertinent imaging studies   Assessment/Plan:  57 y.o. female 3 Days Post-Op s/p hand assisted laparoscopic subtotal colectomy with ileocolostomy for colonic stricture and history of GI bleeding   - Okay to continue soft diet   - Monitor abdominal examination; on-going bowel function   - Pain control prn; antiemetics prn   - Mobilize; working with therapies  - Further management per primary service   - Discharge Planning: Nothing further from surgical perspective, she is doing well. Will arrange follow up in 7-10 days for staple removal. DC instructions updated as well  All of the above findings and recommendations were discussed with the patient, patient's family (son at bedside), and the medical team, and all of patient's and  family's questions were answered to their expressed satisfaction.  -- Lynden Oxford, PA-C  [facility] 08/06/2022, 7:24 AM M-F: 7am - 4pm

## 2022-08-06 NOTE — TOC Transition Note (Signed)
Transition of Care [facility]) - CM/SW Discharge Note   Patient Details  Name: Darlene Barber MRN: [id number] Date of Birth: [birth year]  Transition of Care [facility]) CM/SW Contact:  Chapman Fitch, RN Phone Number: 08/06/2022, 9:26 AM   Clinical Narrative:      Notified by Merry Proud at [facility] that patient is open for PT.  TOC has notified Brandi that patient to dc today    Barriers to Discharge: Continued Medical Work up   Patient Goals and CMS Choice CMS Medicare.gov Compare Post Acute Care list provided to:: Patient Choice offered to / list presented to : Patient  Discharge Placement                         Discharge Plan and Services Additional resources added to the After Visit Summary for                  DME Arranged: Gilmer Mor DME Agency: [employer] Date DME Agency Contacted: 08/02/22   Representative spoke with at DME Agency: Barbara Cower            Social Determinants of Health (SDOH) Interventions SDOH Screenings   Food Insecurity: No Food Insecurity (07/31/2022)  Housing: Low Risk  (07/31/2022)  Transportation Needs: No Transportation Needs (07/31/2022)  Utilities: Not At Risk (07/31/2022)  Depression (PHQ2-9): High Risk (09/03/2021)  Tobacco Use: Medium Risk (08/04/2022)     Readmission Risk Interventions    08/02/2022    2:06 PM 03/11/2022   12:18 PM  Readmission Risk Prevention Plan  Transportation Screening Complete Complete  Medication Review Oceanographer) Complete Complete  PCP or Specialist appointment within 3-5 days of discharge Complete Complete  HRI or Home Care Consult Complete Complete  SW Recovery Care/Counseling Consult Complete Complete  Palliative Care Screening Not Applicable Not Applicable  Skilled Nursing Facility Complete Not Applicable

## 2022-08-06 NOTE — Progress Notes (Signed)
Patient discharged in stable condition. IV removed from patients left wrist. Son at bedside. All discharge paperwork reviewed and all questions answer. Patient is eager to go home. ABD staples remain intact.

## 2022-08-06 NOTE — Progress Notes (Signed)
Physical Therapy Treatment Patient Details Name: Darlene Barber MRN: [id number] DOB: [birth year] Today's Date: 08/06/2022   History of Present Illness Pt is a 57 y/o female admitted secondary to severe anemia and rectal bleeding. Pt found to have lower GIB and a colon stricture s/p subtotal colectomy on 5/25. PMH including but not limited to septic shock admission at [facility] in late December 2023 complicated by development of lower GI bleed on April 06, 2022 with subsequent colonoscopy showing multiple large ulcers of the large bowel.    PT Comments    Pt is making good progress towards goals with ability to ambulate in hallway using RW. Attempted mobility with SPC, however pt unsafe and at increase risk for falls. Pt with personal RW in room and is able to safely propel in hallway. Will continue to progress as able. Updated dispo sent to care team.  Recommendations for follow up therapy are one component of a multi-disciplinary discharge planning process, led by the attending physician.  Recommendations may be updated based on patient status, additional functional criteria and insurance authorization.  Follow Up Recommendations  Can patient physically be transported by private vehicle: Yes    Assistance Recommended at Discharge Set up Supervision/Assistance  Patient can return home with the following A little help with walking and/or transfers;Help with stairs or ramp for entrance   Equipment Recommendations  None recommended by PT    Recommendations for Other Services       Precautions / Restrictions Precautions Precautions: Fall Restrictions Weight Bearing Restrictions: No     Mobility  Bed Mobility Overal bed mobility: Needs Assistance Bed Mobility: Supine to Sit, Sit to Supine     Supine to sit: Supervision     General bed mobility comments: safe technique. Slightly impulsive and gets OOB prior to therapist initiation.    Transfers Overall transfer level:  Needs assistance Equipment used: 1 person hand held assist Transfers: Sit to/from Stand, Bed to chair/wheelchair/BSC Sit to Stand: Supervision           General transfer comment: cues for sequencing. Initially attempted with [facility], however unsteady, further attempts performed with RW. Improved technique    Ambulation/Gait Ambulation/Gait assistance: Min guard Gait Distance (Feet): 200 Feet Assistive device: Rolling walker (2 wheels) Gait Pattern/deviations: Step-through pattern       General Gait Details: ambulated around RN station with cross over gait that worsens when pt becomes distracted. No ataxia noted. Pt frequently removes hands off walker. Cues given for safety   Stairs             Wheelchair Mobility    Modified Rankin (Stroke Patients Only)       Balance Overall balance assessment: Needs assistance Sitting-balance support: Feet supported Sitting balance-Leahy Scale: Good     Standing balance support: During functional activity, Single extremity supported, Bilateral upper extremity supported Standing balance-Leahy Scale: Fair                              Cognition Arousal/Alertness: Awake/alert Behavior During Therapy: WFL for tasks assessed/performed Overall Cognitive Status: Within Functional Limits for tasks assessed                                 General Comments: decreased safety awareness, however pleasant and agreeable to session        Exercises      General Comments  Pertinent Vitals/Pain Pain Assessment Pain Assessment: No/denies pain    Home Living                          Prior Function            PT Goals (current goals can now be found in the care plan section) Acute Rehab PT Goals Patient Stated Goal: decrease pain PT Goal Formulation: With patient Time For Goal Achievement: 08/18/22 Potential to Achieve Goals: Good Progress towards PT goals: Progressing toward  goals    Frequency    Min 4X/week      PT Plan Discharge plan needs to be updated    Co-evaluation              AM-PAC PT "6 Clicks" Mobility   Outcome Measure  Help needed turning from your back to your side while in a flat bed without using bedrails?: A Little Help needed moving from lying on your back to sitting on the side of a flat bed without using bedrails?: A Little Help needed moving to and from a bed to a chair (including a wheelchair)?: A Little Help needed standing up from a chair using your arms (e.g., wheelchair or bedside chair)?: A Little Help needed to walk in hospital room?: A Little Help needed climbing 3-5 steps with a railing? : A Little 6 Click Score: 18    End of Session   Activity Tolerance: Patient tolerated treatment well Patient left: in bed Nurse Communication: Mobility status PT Visit Diagnosis: Unsteadiness on feet (R26.81);Other abnormalities of gait and mobility (R26.89)     Time: 1610-9604 PT Time Calculation (min) (ACUTE ONLY): 13 min  Charges:  $Gait Training: 8-22 mins                     Elizabeth Palau, PT, DPT, GCS [phone]    [name] 08/06/2022, 12:38 PM

## 2022-08-06 NOTE — Discharge Instructions (Addendum)
In addition to included general post-operative instructions,  Diet: Resume home diet.   Activity: No heavy lifting >20 pounds (children, pets, laundry, garbage) or strenuous activity for 6 weeks, but light activity and walking are encouraged. Do not drive or drink alcohol if taking narcotic pain medications or having pain that might distract from driving. Okay to work with therapies as needed   Wound care: You may shower/get incision wet with soapy water and pat dry (do not rub incisions), but no baths or submerging incision underwater until follow-up. We will remove staples in follow up.   Medications: Resume all home medications except exenatide; hold this week to recover from abdominal surgery first. For mild to moderate pain: acetaminophen (Tylenol) or ibuprofen/naproxen (if no kidney disease). Combining Tylenol with alcohol can substantially increase your risk of causing liver disease. Narcotic pain medications, if prescribed, can be used for severe pain, though may cause nausea, constipation, and drowsiness. Do not combine Tylenol and Percocet (or similar) within a 6 hour period as Percocet (and similar) contain(s) Tylenol. If you do not need the narcotic pain medication, you do not need to fill the prescription.  Call office [phone] / [phone]) at any time if any questions, worsening pain, fevers/chills, bleeding, drainage from incision site, or other concerns.

## 2022-08-06 NOTE — Discharge Summary (Signed)
Physician Discharge Summary   Patient: Darlene Barber MRN: [id number] DOB: Mar 24, 1965  Admit date:     07/30/2022  Discharge date: 08/06/22  Discharge Physician: Alford Highland   PCP: Gracelyn Nurse, MD   Recommendations at discharge:   Follow-up PCP 5 days Follow-up general surgery around 10 days  Discharge Diagnoses: Principal Problem:   Colon stricture (HCC) Active Problems:   Lower GI bleeding   Anemia of chronic disease   Metabolic acidosis   CKD (chronic kidney disease), stage IV (HCC)   Essential hypertension   Hypokalemia   Depression   Colitis   Hypomagnesemia   Acute GI bleeding   Acute blood loss anemia   Hospital Course:  57 y.o. female with medical history significant of septic shock admission at [facility] in late December 2023.  That hospitalization was complicated by development of lower GI bleed on April 06, 2022 with subsequent colonoscopy showing multiple large ulcers of the large bowel.  6 patient was subsequently discharged in February 2024.  Unfortunately patient has had recurrent episodes of radiologic diagnosis of colitis as well as finding of lower GI bleed since then.  Most recently patient comes in now with about 2 weeks of rather daily lower GI bleed varying from a single episode in a day to 5 bloody stools in a day associated with generalized abdominal cramps.  No vomiting is reported no fever.   Patient was evaluated at office nephrology today and found to have severe anemia and sent to [facility] ER.  5/22.  Patient seen this morning was on liquid diet.  Consulted GI for evaluation.  Patient complaining of lower abdominal pain and coming around from her back.  Patient received 1 unit of packed red blood cells yesterday and receiving another unit today.  Stool studies ordered.  Colitis showing on CT scan.  Patient with recent C. difficile colitis few months ago. 5/23. Hb up to 8.7.  Still having abdominal soreness, dry mouth and back pain.  Not many  BM with prep, so colonoscopy pushed til tomorrow and re-prep today. 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  Gastroenterology recommended surgical evaluation.  General surgery to remove part of the colon tomorrow. 5/25.  Seen this morning prior to going to the operating room.  Hemoglobin 7.6 with gentle fluids given overnight.  Will give 1 unit of packed red blood cells prior to surgery. 5/26.  Postoperative day 1 for laparoscopic subtotal colectomy.  Replacing IV magnesium.  Physical therapy evaluation.  Currently on clear liquid diet. 5/27.  Postoperative day 2.  General surgery advance diet to full liquid diet for lunch and soft diet for dinner.  Patient had bowel movements. 5/28.  Postoperative day 3.  Patient with soreness in the abdomen.  Tolerating solid food.  General surgery cleared to go home.  Small prescription of pain medications prescribed.  Assessment and Plan: * Colon stricture [facility]) Operating room on 5/25 for subtotal colectomy.  Pain control with oral pain medications than Tylenol after pain medications run out.  Tolerated advance diet.  Had bowel movement after surgery.  Cleared by general surgery to go home.  Lower GI bleeding Received 3 units of packed red blood cells during this hospital course.  Hemoglobin upon discharge 8.7  Anemia of chronic disease Received 3 units of blood on this hospital stay. Prior ferritin 401 going along with anemia of chronic disease.  Last hemoglobin 8.7  Metabolic acidosis Likely secondary to chronic kidney  disease.  Continue sodium bicarb tablets.  CKD (chronic kidney disease), stage IV (HCC) Last creatinine 2.61 with a GFR of 21  Essential hypertension I discontinued Norvasc during the hospital course.  Hypokalemia Replaced  Depression On Zoloft.  Colitis Colitis seen on CT scan and gave empiric antibiotics initially.  This was ruled out by colonoscopy.  Seeing a stricture instead.   Stool studies negative.   Hypomagnesemia Replaced during hospital course.  Now that she is back on a regular diet this should not be an issue         Consultants: Gastroenterology, general surgery Procedures performed: Colonoscopy, subtotal colectomy Disposition: Home health Diet recommendation:  Regular diet DISCHARGE MEDICATION: Allergies as of 08/06/2022       Reactions   Other Anaphylaxis, Itching, Swelling, Other (See Comments)   Black pepper   Black Pepper-turmeric    Latex Rash   Silicone Rash   Tape Rash, Other (See Comments)   Paper tape is ok to use.        Medication List     STOP taking these medications    (feeding supplement) PROSource Plus liquid   amLODipine 10 MG tablet Commonly known as: NORVASC   Colace 100 MG capsule Generic drug: docusate sodium   Exenatide ER 2 MG Pen   famotidine 10 MG tablet Commonly known as: PEPCID   furosemide 20 MG tablet Commonly known as: Lasix   lidocaine 5 % Commonly known as: LIDODERM   loperamide 2 MG capsule Commonly known as: IMODIUM   multivitamin Tabs tablet   promethazine 25 MG tablet Commonly known as: PHENERGAN       TAKE these medications    acetaminophen 500 MG tablet Commonly known as: TYLENOL Take 2 tablets (1,000 mg total) by mouth every 6 (six) hours.   acyclovir 400 MG tablet Commonly known as: ZOVIRAX Take 400 mg by mouth 2 (two) times daily.   albuterol 108 (90 Base) MCG/ACT inhaler Commonly known as: Ventolin HFA INHALE 2 PUFFS EVERY FOUR HOURS AS NEEDED What changed:  how much to take how to take this when to take this reasons to take this additional instructions   ALPRAZolam 0.25 MG tablet Commonly known as: XANAX Take 1 tablet (0.25 mg total) by mouth 3 (three) times daily as needed for anxiety.   ANTACID SOFT CHEWS PO Take 1 tablet by mouth every 6 (six) hours as needed (for indigesation- CHEW).   Breyna 160-4.5 MCG/ACT inhaler Generic drug:  budesonide-formoterol Inhale 2 puffs into the lungs 2 (two) times daily.   Emgality 120 MG/ML Sosy Generic drug: Galcanezumab-gnlm Inject into the skin every 30 (thirty) days.   fluticasone 50 MCG/ACT nasal spray Commonly known as: FLONASE 1 SPARY IN EACH NOSTRIL DAILY What changed:  how much to take how to take this when to take this additional instructions   loratadine 10 MG tablet Commonly known as: CLARITIN Take 10 mg by mouth in the morning and at bedtime.   nortriptyline 25 MG capsule Commonly known as: PAMELOR Take 25-50 mg by mouth See admin instructions. Take 25 mg (1 capsule) by mouth every morning, and 50 mg (2 capsules) every night at bedtime   omeprazole 40 MG capsule Commonly known as: PRILOSEC Take 40 mg by mouth daily before breakfast.   ondansetron 4 MG disintegrating tablet Commonly known as: ZOFRAN-ODT Take 4 mg by mouth every 8 (eight) hours as needed for nausea.   oxyCODONE 5 MG immediate release tablet Commonly known as: Oxy IR/ROXICODONE Take 1  tablet (5 mg total) by mouth every 6 (six) hours as needed for up to 5 days for severe pain.   pregabalin 25 MG capsule Commonly known as: LYRICA Take 25 mg by mouth in the morning.   PROBIOTIC PO Take 1 capsule by mouth daily.   QUEtiapine 300 MG tablet Commonly known as: SEROQUEL Take 300 mg by mouth at bedtime.   Rexulti 1 MG Tabs tablet Generic drug: brexpiprazole Take 1 mg by mouth daily.   sertraline 100 MG tablet Commonly known as: ZOLOFT Take 100 mg by mouth daily.   sodium bicarbonate 650 MG tablet Take 1 tablet (650 mg total) by mouth 3 (three) times daily. What changed: when to take this   sucralfate 1 g tablet Commonly known as: CARAFATE Take 1 g by mouth See admin instructions. Take 1 gram by mouth in the morning and before supper   SUPER B COMPLEX PO Take 1 tablet by mouth daily with breakfast.   tiotropium 18 MCG inhalation capsule Commonly known as: SPIRIVA Place 1  capsule (18 mcg total) into inhaler and inhale daily.   traZODone 50 MG tablet Commonly known as: DESYREL Take 1 tablet (50 mg total) by mouth at bedtime as needed for sleep. What changed: when to take this               Durable Medical Equipment  (From admission, onward)           Start     Ordered   08/02/22 1355  For home use only DME Cane  Once        08/02/22 1354            Follow-up Information     Gracelyn Nurse, MD Follow up in 5 day(s).   Specialty: Internal Medicine Why: Patient needs to call for appointment. Contact information: [address] [place] [address] [phone]         Donovan Kail, PA-C. Go in 10 day(s).   Specialty: Physician Assistant Why: June 6 at 2:45 PM Contact information: [address] [place] [address] [phone]                Discharge Exam: Ceasar Mons Weights   07/30/22 1510 08/05/22 0400  Weight: 62.1 kg 67.6 kg   Physical Exam HENT:     Head: Normocephalic.     Mouth/Throat:     Pharynx: No oropharyngeal exudate.  Eyes:     General: Lids are normal.     Conjunctiva/sclera: Conjunctivae normal.  Cardiovascular:     Rate and Rhythm: Normal rate and regular rhythm.     Heart sounds: Normal heart sounds, S1 normal and S2 normal.  Pulmonary:     Breath sounds: No decreased breath sounds, wheezing, rhonchi or rales.  Abdominal:     Palpations: Abdomen is soft.     Tenderness: There is generalized abdominal tenderness.  Musculoskeletal:     Right lower leg: No swelling.     Left lower leg: No swelling.  Skin:    General: Skin is warm.     Findings: No rash.  Neurological:     Mental Status: She is alert and oriented to person, place, and time.      Condition at discharge: stable  The results of significant diagnostics from this hospitalization (including imaging, microbiology, ancillary and laboratory) are listed below for reference.   Imaging Studies: CT  ABDOMEN PELVIS WO CONTRAST  Result Date: 07/30/2022 CLINICAL DATA:  Abdominal pain.  History of colitis. Rectal bleeding. EXAM: CT ABDOMEN AND PELVIS WITHOUT CONTRAST TECHNIQUE: Multidetector CT imaging of the abdomen and pelvis was performed following the standard protocol without IV contrast. RADIATION DOSE REDUCTION: This exam was performed according to the departmental dose-optimization program which includes automated exposure control, adjustment of the mA and/or kV according to patient size and/or use of iterative reconstruction technique. COMPARISON:  CT abdomen and pelvis 05/28/2022 FINDINGS: Lower chest: No acute abnormality. Hepatobiliary: No focal liver abnormality is seen. Status post cholecystectomy. No biliary dilatation. Pancreas: Unremarkable. Spleen: Unremarkable. Adrenals/Urinary Tract: Stable adrenal glands. Nonobstructing punctate bilateral nephrolithiasis. No hydronephrosis. Unremarkable bladder. Stomach/Bowel: Stomach is within normal limits. Normal caliber large and small bowel. Wall thickening and pericolonic fat stranding about the ascending and transverse colon compatible with colitis. The previously seen wall thickening and stranding about the descending and sigmoid colon has resolved. Normal appendix. Vascular/Lymphatic: Low-attenuation blood pool compatible with anemia. Aortic calcified atherosclerosis. No lymphadenopathy. Reproductive: No acute abnormality. Other: No organized fluid collection or abscess. No free intraperitoneal air. Musculoskeletal: No acute osseous abnormality. IMPRESSION: 1. Acute colitis involving the ascending and transverse colon. 2. Nonobstructing bilateral nephrolithiasis. 3. Low-attenuation blood pool compatible with anemia. Aortic Atherosclerosis (ICD10-I70.0). Electronically Signed   By: Minerva Fester M.D.   On: 07/30/2022 18:27   MR BRAIN WO CONTRAST  Result Date: 07/12/2022 CLINICAL DATA:  Left-sided weakness/numbness/migraines. Some neck pain. EXAM:  MRI HEAD WITHOUT CONTRAST TECHNIQUE: Multiplanar, multiecho pulse sequences of the brain and surrounding structures were obtained without intravenous contrast. COMPARISON:  MRI brain 04/22/2021. FINDINGS: Brain: No acute infarct or hemorrhage. No hydrocephalus or extra-axial collection. No mass or midline shift. No foci of abnormal susceptibility. Vascular: Normal flow voids. Skull and upper cervical spine: Upper cervical spondylosis with at least mild spinal canal stenosis at C3-4. Sinuses/Orbits: Unremarkable. Other: None. IMPRESSION: 1. Normal MRI of the brain. 2. Upper cervical spondylosis with at least mild spinal canal stenosis at C3-4. Electronically Signed   By: Orvan Falconer M.D.   On: 07/12/2022 19:07    Microbiology: Results for orders placed or performed during the hospital encounter of 07/30/22  C Difficile Quick Screen w PCR reflex     Status: None   Collection Time: 08/01/22  9:00 AM   Specimen: STOOL  Result Value Ref Range Status   C Diff antigen NEGATIVE NEGATIVE Final   C Diff toxin NEGATIVE NEGATIVE Final   C Diff interpretation No C. difficile detected.  Final    Comment: Performed at [facility], [address] [place], [place] [address]  Gastrointestinal Panel by PCR , Stool     Status: None   Collection Time: 08/01/22  6:00 PM   Specimen: Stool  Result Value Ref Range Status   Campylobacter species NOT DETECTED NOT DETECTED Final   Plesimonas shigelloides NOT DETECTED NOT DETECTED Final   Salmonella species NOT DETECTED NOT DETECTED Final   Yersinia enterocolitica NOT DETECTED NOT DETECTED Final   Vibrio species NOT DETECTED NOT DETECTED Final   Vibrio cholerae NOT DETECTED NOT DETECTED Final   Enteroaggregative E coli (EAEC) NOT DETECTED NOT DETECTED Final   Enteropathogenic E coli (EPEC) NOT DETECTED NOT DETECTED Final   Enterotoxigenic E coli (ETEC) NOT DETECTED NOT DETECTED Final   Shiga like toxin producing E coli (STEC) NOT DETECTED NOT DETECTED  Final   Shigella/Enteroinvasive E coli (EIEC) NOT DETECTED NOT DETECTED Final   Cryptosporidium NOT DETECTED NOT DETECTED Final   Cyclospora cayetanensis NOT DETECTED NOT DETECTED Final  Entamoeba histolytica NOT DETECTED NOT DETECTED Final   Giardia lamblia NOT DETECTED NOT DETECTED Final   Adenovirus F40/41 NOT DETECTED NOT DETECTED Final   Astrovirus NOT DETECTED NOT DETECTED Final   Norovirus GI/GII NOT DETECTED NOT DETECTED Final   Rotavirus A NOT DETECTED NOT DETECTED Final   Sapovirus (I, II, IV, and V) NOT DETECTED NOT DETECTED Final    Comment: Performed at [facility], [address] [place], [place] [address]  OVA + PARASITE EXAM     Status: None   Collection Time: 08/01/22  6:00 PM   Specimen: STOOL  Result Value Ref Range Status   OVA + PARASITE EXAM Final report  Final    Comment: (NOTE) These results were obtained using wet preparation(s) and trichrome stained smear. This test does not include testing for Cryptosporidium parvum, Cyclospora, or Microsporidia. Performed At: [facility] [address] [place], [place] [id number] Jolene Schimke MD XB:[id number]    Source of Sample STOOL  Final    Comment: Performed at [facility], [address] [place], [place] [address]    Labs: CBC: Recent Labs  Lab 08/02/22 [id number] 08/03/22 0415 08/04/22 0416 08/05/22 0422 08/06/22 0452  WBC 4.4 4.3 13.2* 11.5* 9.9  HGB 8.9* 7.6* 9.3* 9.5* 8.7*  HCT 26.1* 23.2* 27.9* 29.2* 26.5*  MCV 91.3 92.8 92.4 94.5 93.6  PLT 390 348 360 342 344   Basic Metabolic Panel: Recent Labs  Lab 08/02/22 0435 08/03/22 0415 08/04/22 0416 08/05/22 0422 08/06/22 0452  NA 138 138 137 137 142  K 3.5 3.7 4.0 3.6 4.1  CL 112* 114* 112* 113* 116*  CO2 20* 17* 19* 19* 20*  GLUCOSE 89 86 116* 97 97  BUN 24* 21* 19 18 19   CREATININE 2.64* 2.63* 2.59* 2.69* 2.61*  CALCIUM 8.4* 8.2* 7.7* 8.0* 8.3*  MG  --   --  1.2* 1.7  --    Liver Function Tests: Recent Labs   Lab 08/04/22 0416  AST 15  ALT 12  ALKPHOS 54  BILITOT 0.4  PROT 4.6*  ALBUMIN 2.2*   CBG: Recent Labs  Lab 08/01/22 1804 08/03/22 0858 08/03/22 1324 08/03/22 1744 08/03/22 1952  GLUCAP 108* 60* 203* 214* 204*    Discharge time spent: greater than 30 minutes.  Signed: Alford Highland, MD [facility] 08/06/2022

## 2022-08-06 NOTE — Progress Notes (Signed)
Mobility Specialist - Progress Note   08/06/22 1100  Mobility  Activity Ambulated with assistance in room  Level of Assistance Standby assist, set-up cues, supervision of patient - no hands on  Assistive Device None  Distance Ambulated (ft) 30 ft  Range of Motion/Exercises Active  Activity Response Tolerated well  $Mobility charge 1 Mobility     Pt ambulated in room with supervision, no AD. Mildly unsteady. MinA to don LB clothing. Pain at incision site with movement. Completed bed mobility modI. Pt left in bed with needs in reach.    Filiberto Pinks Mobility Specialist 08/06/22, 11:31 AM

## 2022-08-08 ENCOUNTER — Telehealth: Payer: Self-pay | Admitting: Urology

## 2022-08-08 NOTE — Telephone Encounter (Signed)
Pt was in hospital for about a week.  She had surgery a week ago (not by [place]).  She wants to know if she can have her bladder tacked or what we could do about it now that she has no insides.

## 2022-08-08 NOTE — Telephone Encounter (Signed)
Spoke with patient and scheduled OV to discuss

## 2022-08-12 ENCOUNTER — Telehealth: Payer: Self-pay

## 2022-08-12 LAB — SURGICAL PATHOLOGY

## 2022-08-12 NOTE — Telephone Encounter (Signed)
Notified patient as instructed, patient pleased. Discussed follow-up appointments, patient agrees  

## 2022-08-12 NOTE — Telephone Encounter (Signed)
-----   Message from Leafy Ro, MD sent at 08/12/2022 10:24 AM EDT ----- Please let her know that pathology did not show any cancer, congratulations ----- Message ----- From: Interface, Lab In [facility] Sent: 08/12/2022   8:10 AM EDT To: Leafy Ro, MD

## 2022-08-13 ENCOUNTER — Other Ambulatory Visit: Payer: Self-pay

## 2022-08-13 ENCOUNTER — Encounter: Payer: Medicaid Other | Admitting: Physician Assistant

## 2022-08-13 ENCOUNTER — Emergency Department: Payer: Medicaid Other

## 2022-08-13 ENCOUNTER — Emergency Department
Admission: EM | Admit: 2022-08-13 | Discharge: 2022-08-13 | Disposition: A | Discharge status: Home / Self Care | Payer: Medicaid Other | Attending: Student in an Organized Health Care Education/Training Program | Admitting: Student in an Organized Health Care Education/Training Program

## 2022-08-13 DIAGNOSIS — T8131XA Disruption of external operation (surgical) wound, not elsewhere classified, initial encounter: Secondary | ICD-10-CM | POA: Diagnosis present

## 2022-08-13 DIAGNOSIS — T8130XA Disruption of wound, unspecified, initial encounter: Secondary | ICD-10-CM

## 2022-08-13 DIAGNOSIS — R1084 Generalized abdominal pain: Secondary | ICD-10-CM

## 2022-08-13 LAB — CBC
HCT: 29.5 % — ABNORMAL LOW (ref 36.0–46.0)
Hemoglobin: 9.4 g/dL — ABNORMAL LOW (ref 12.0–15.0)
MCH: 29.7 pg (ref 26.0–34.0)
MCHC: 31.9 g/dL (ref 30.0–36.0)
MCV: 93.1 fL (ref 80.0–100.0)
Platelets: 676 10*3/uL — ABNORMAL HIGH (ref 150–400)
RBC: 3.17 MIL/uL — ABNORMAL LOW (ref 3.87–5.11)
RDW: 15.5 % (ref 11.5–15.5)
WBC: 10.3 10*3/uL (ref 4.0–10.5)
nRBC: 0 % (ref 0.0–0.2)

## 2022-08-13 LAB — COMPREHENSIVE METABOLIC PANEL
ALT: 9 U/L (ref 0–44)
AST: 11 U/L — ABNORMAL LOW (ref 15–41)
Albumin: 2.4 g/dL — ABNORMAL LOW (ref 3.5–5.0)
Alkaline Phosphatase: 84 U/L (ref 38–126)
Anion gap: 8 (ref 5–15)
BUN: 16 mg/dL (ref 6–20)
CO2: 23 mmol/L (ref 22–32)
Calcium: 8.6 mg/dL — ABNORMAL LOW (ref 8.9–10.3)
Chloride: 111 mmol/L (ref 98–111)
Creatinine, Ser: 2.19 mg/dL — ABNORMAL HIGH (ref 0.44–1.00)
GFR, Estimated: 26 mL/min — ABNORMAL LOW (ref >60)
Glucose, Bld: 108 mg/dL — ABNORMAL HIGH (ref 70–99)
Potassium: 3.8 mmol/L (ref 3.5–5.1)
Sodium: 142 mmol/L (ref 135–145)
Total Bilirubin: 0.4 mg/dL (ref 0.3–1.2)
Total Protein: 6.3 g/dL — ABNORMAL LOW (ref 6.5–8.1)

## 2022-08-13 LAB — LIPASE, BLOOD: Lipase: 22 U/L (ref 11–51)

## 2022-08-13 MED ORDER — PROBIOTIC 250 MG PO CAPS: 1.0000 | ORAL_CAPSULE | Freq: Two times a day (BID) | ORAL | 0 refills | Status: DC

## 2022-08-13 MED ORDER — CEPHALEXIN 500 MG PO CAPS: 500.0000 mg | ORAL_CAPSULE | Freq: Three times a day (TID) | ORAL | 0 refills | Status: AC

## 2022-08-13 MED ORDER — CEPHALEXIN 500 MG PO CAPS
500.0000 mg | ORAL_CAPSULE | Freq: Once | ORAL | Status: AC
  Administered 2022-08-13: 500 mg via ORAL
  Filled 2022-08-13: qty 1

## 2022-08-13 NOTE — ED Provider Notes (Signed)
[facility] Provider Note    Event Date/Time   First MD Initiated Contact with Patient 08/13/22 1617     (approximate)   History   Abdominal Pain   HPI  Darlene Barber is a 57 y.o. female with history of diverticular abscess status post subtotal hemicolectomy presents to the ER for evaluation of abdominal tenderness as well as some drainage from the laparotomy incision.  Has noted a little bit of blood as well as some purulent drainage.  Is having worsening pain but no fevers or chills.     Physical Exam   Triage Vital Signs: ED Triage Vitals  Enc Vitals Group     BP 08/13/22 1547 (!) 140/95     Pulse Rate 08/13/22 1547 (!) 101     Resp 08/13/22 1547 18     Temp 08/13/22 1547 98.2 F (36.8 C)     Temp src --      SpO2 08/13/22 1547 98 %     Weight --      Height --      Head Circumference --      Peak Flow --      Pain Score 08/13/22 1546 7     Pain Loc --      Pain Edu? --      Excl. in GC? --     Most recent vital signs: Vitals:   08/13/22 1547  BP: (!) 140/95  Pulse: (!) 101  Resp: 18  Temp: 98.2 F (36.8 C)  SpO2: 98%     Constitutional: Alert  Eyes: Conjunctivae are normal.  Head: Atraumatic. Nose: No congestion/rhinnorhea. Mouth/Throat: Mucous membranes are moist.   Neck: Painless ROM.  Cardiovascular:   Good peripheral circulation. Respiratory: Normal respiratory effort.  No retractions.  Gastrointestinal: Soft and mild tenderness that seems appropriate postoperatively.  One of the staples just superior to the umbilicus does appear to have come out with 1 cm area of dehiscence and some drainage.  No surrounding fluctuance or significant cellulitic changes. Musculoskeletal:  no deformity Neurologic:  MAE spontaneously. No gross focal neurologic deficits are appreciated.  Skin:  Skin is warm, dry and intact. No rash noted. Psychiatric: Mood and affect are normal. Speech and behavior are normal.    ED Results /  Procedures / Treatments   Labs (all labs ordered are listed, but only abnormal results are displayed) Labs Reviewed  COMPREHENSIVE METABOLIC PANEL - Abnormal; Notable for the following components:      Result Value   Glucose, Bld 108 (*)    Creatinine, Ser 2.19 (*)    Calcium 8.6 (*)    Total Protein 6.3 (*)    Albumin 2.4 (*)    AST 11 (*)    GFR, Estimated 26 (*)    All other components within normal limits  CBC - Abnormal; Notable for the following components:   RBC 3.17 (*)    Hemoglobin 9.4 (*)    HCT 29.5 (*)    Platelets 676 (*)    All other components within normal limits  LIPASE, BLOOD  URINALYSIS, ROUTINE W REFLEX MICROSCOPIC     EKG     RADIOLOGY    PROCEDURES:  Critical Care performed: No  Procedures   MEDICATIONS ORDERED IN ED: Medications  cephALEXin (KEFLEX) capsule 500 mg (has no administration in time range)     IMPRESSION / MDM / ASSESSMENT AND PLAN / ED COURSE  I reviewed the triage vital signs and the nursing  notes.                              Differential diagnosis includes, but is not limited to, dehiscence, abscess, cellulitis, perforation, SBO, colitis, diverticulitis  Patient presenting to the ER for evaluation of symptoms as described above.  Based on symptoms, risk factors and considered above differential, this presenting complaint could reflect a potentially life-threatening illness therefore the patient will be placed on continuous pulse oximetry and telemetry for monitoring.  Laboratory evaluation will be sent to evaluate for the above complaints.      Clinical Course as of 08/13/22 1821  Tue Aug 13, 2022  1740 CT imaging my review and interpretation without evidence of significant abscess will await formal radiology report. [PR]  1817 CT imaging with radiology report with nonspecific findings no abscess.  No white count no fever.  Discussed case in consultation with surgery we will plan initiating on antibiotics and  wet-to-dry dressings for the area of small dehiscence.  She is tolerating p.o.  Possible ileus but not showing signs of obstruction.  Does appear appropriate for outpatient follow-up at this time.  We discussed strict return precautions.  She has close outpatient follow-up on Thursday with surgery scheduled already. [PR]    Clinical Course User Index [PR] Willy Eddy, MD     FINAL CLINICAL IMPRESSION(S) / ED DIAGNOSES   Final diagnoses:  Generalized abdominal pain  Wound dehiscence     Rx / DC Orders   ED Discharge Orders          Ordered    cephALEXin (KEFLEX) 500 MG capsule  3 times daily        08/13/22 1819    Saccharomyces boulardii (PROBIOTIC) 250 MG CAPS  2 times daily        08/13/22 1819             Note:  This document was prepared using Dragon voice recognition software and may include unintentional dictation errors.    Willy Eddy, MD 08/13/22 [phone]

## 2022-08-13 NOTE — ED Triage Notes (Signed)
Pt comes with c/o belly swelling. Pt states this all started yesterday. Pt states some drainage from area. Pt states she had some of her colon cut out about 2 weeks ago.

## 2022-08-15 ENCOUNTER — Encounter: Payer: Self-pay | Admitting: Physician Assistant

## 2022-08-15 ENCOUNTER — Encounter: Payer: Medicaid Other | Admitting: Physician Assistant

## 2022-08-15 ENCOUNTER — Ambulatory Visit (INDEPENDENT_AMBULATORY_CARE_PROVIDER_SITE_OTHER): Payer: Medicaid Other | Admitting: Physician Assistant

## 2022-08-15 VITALS — BP 107/73 | HR 99 | Temp 98.3°F | Ht <= 58 in | Wt 138.0 lb

## 2022-08-15 DIAGNOSIS — K56699 Other intestinal obstruction unspecified as to partial versus complete obstruction: Secondary | ICD-10-CM

## 2022-08-15 DIAGNOSIS — Z09 Encounter for follow-up examination after completed treatment for conditions other than malignant neoplasm: Secondary | ICD-10-CM

## 2022-08-15 DIAGNOSIS — K922 Gastrointestinal hemorrhage, unspecified: Secondary | ICD-10-CM

## 2022-08-15 MED ORDER — OXYCODONE HCL 5 MG PO TABS: 5.0000 mg | ORAL_TABLET | Freq: Four times a day (QID) | ORAL | 0 refills | Status: DC | PRN

## 2022-08-15 NOTE — Progress Notes (Signed)
[facility] POST-OP OFFICE VISIT  08/15/2022  HPI: Darlene Barber is a 57 y.o. female 12 days s/p hand assisted laparoscopic colectomy subtotal for colonic stricture and GI bleeding with Dr Everlene Farrier  She did present to the ED on 06/04 despite her scheduled follow up secondary to concerns over some of her staples coming out. She was worked up in the ED and noted to have a small area of superficial dehiscence near the umbilicus. Her labs were reassuring. CT Abdomen/Pelvis obtained as well with expected post-surgical changes, no free air, no abscess, anastomosis appears patent. I did discuss case with EDP at that time. She was sent home with Keflex x7 days  Today, she reports she is doing much better compared to presentation to the ED. Her bloating is resolving. She is constipated but this is baseline for her. Pre-operative nausea is resolved, no emesis. Abdomen remains sore. No fever, chills. Appetite is decreased. She is ambulating.   Vital signs: Ht 4\' 10"  (1.473 m)   BMI 31.15 kg/m    Physical Exam: Constitutional: Well appearing female, NAD Abdomen: Soft, expected incisional soreness, non-distended, no rebound/guarding Skin: Her hand assisted incision is healing well, there is a small, ~1 cm, area of very superficial dehiscence to the inferior aspect just above her umbilicus. There is no depth to this, no drainage, no erythema. The remainder of that incision and the other 4 laparoscopic incisions are CDI with staples (removed), no erythema or drainage     Imaging Reviewed:   CT Abdomen/Pelvis (08/13/2022) personally reviewed with expected changes s/p colectomy, anastomosis intact, no gross free air, no evidence of abscess, and radiologist report reviewed below:  IMPRESSION: 1. Status post right hemicolectomy with ileocolic anastomosis in the left abdomen. Small bowel loops in the left abdomen near the anastomosis show distension up to 2.8 cm diameter and fecalization of  enteric contents suggesting decreased transit. Anatomy in the region of the anastomosis is not well demonstrated due to lack of intravenous and oral contrast material and adjacent free fluid. Underlying component of ileus could have this appearance. Stricture at the level of the anastomosis is not excluded. 2. Small to moderate volume free fluid in the abdomen and pelvis. No definite intraperitoneal free gas at this time. Although lack of intravenous contrast hinders assessment, no well-defined/organized fluid collection is evident to suggest the presence of an abscess. CT imaging after IV contrast administration may prove helpful to further evaluate as clinically warranted. 3. Edema and congestion in the midline and left upper quadrant omentum as well as mesenteric loops of the left abdomen. Imaging features are nonspecific. 4. Bilateral nonobstructing nephrolithiasis. 5. Diffuse body wall edema. 6.  Aortic Atherosclerosis (ICD10-I70.0).   Assessment/Plan: This is a 58 y.o. female 12 days s/p hand assisted laparoscopic colectomy subtotal for colonic stricture and GI bleeding with Dr Everlene Farrier   - [name] removed; steri-strips placed  - She can cover small area of dehiscence with superficial dressings prn  - Complete Abx  - encouraged bowel regimen to limit constipation although this is baseline for her  - Pain control prn; will give one more refill of pain medications; encouraged alternatives as feasible, she is limited by history and comorbidities   - Reviewed wound care recommendation  - Reviewed lifting restrictions; 6 weeks total  - Reviewed surgical pathology  - I will see her again in 3-4 weeks; She understands to call with questions/concerns  -- Lynden Oxford, PA-C  [facility] 08/15/2022, 3:11 PM M-F: 7am - 4pm

## 2022-08-15 NOTE — Patient Instructions (Signed)
Just keep a dressing over the wound until it closes fully.  Follow up here in 3 weeks.   GENERAL POST-OPERATIVE PATIENT INSTRUCTIONS   WOUND CARE INSTRUCTIONS:  Keep a dry clean dressing on the wound if there is drainage. The initial bandage may be removed after 24 hours.  Once the wound has quit draining you may leave it open to air.  If clothing rubs against the wound or causes irritation and the wound is not draining you may cover it with a dry dressing during the daytime.  Try to keep the wound dry and avoid ointments on the wound unless directed to do so.  If the wound becomes bright red and painful or starts to drain infected material that is not clear, please contact your physician immediately.  If the wound is mildly pink and has a thick firm ridge underneath it, this is normal, and is referred to as a healing ridge.  This will resolve over the next 4-6 weeks.  BATHING: You may shower if you have been informed of this by your surgeon. However, Please do not submerge in a tub, hot tub, or pool until incisions are completely sealed or have been told by your surgeon that you may do so.  DIET:  You may eat any foods that you can tolerate.  It is a good idea to eat a high fiber diet and take in plenty of fluids to prevent constipation.  If you do become constipated you may want to take a mild laxative or take ducolax tablets on a daily basis until your bowel habits are regular.  Constipation can be very uncomfortable, along with straining, after recent surgery.  ACTIVITY:  You are encouraged to cough and deep breath or use your incentive spirometer if you were given one, every 15-30 minutes when awake.  This will help prevent respiratory complications and low grade fevers post-operatively if you had a general anesthetic.  You may want to hug a pillow when coughing and sneezing to add additional support to the surgical area, if you had abdominal or chest surgery, which will decrease pain during  these times.  You are encouraged to walk and engage in light activity for the next two weeks.  You should not lift more than 20 pounds for 6 weeks total after surgery as it could put you at increased risk for complications.  Twenty pounds is roughly equivalent to a plastic bag of groceries. At that time- Listen to your body when lifting, if you have pain when lifting, stop and then try again in a few days. Soreness after doing exercises or activities of daily living is normal as you get back in to your normal routine.  MEDICATIONS:  Try to take narcotic medications and anti-inflammatory medications, such as tylenol, ibuprofen, naprosyn, etc., with food.  This will minimize stomach upset from the medication.  Should you develop nausea and vomiting from the pain medication, or develop a rash, please discontinue the medication and contact your physician.  You should not drive, make important decisions, or operate machinery when taking narcotic pain medication.  SUNBLOCK Use sun block to incision area over the next year if this area will be exposed to sun. This helps decrease scarring and will allow you avoid a permanent darkened area over your incision.  QUESTIONS:  Please feel free to call our office if you have any questions, and we will be glad to assist you. [phone]

## 2022-09-03 ENCOUNTER — Encounter: Payer: Self-pay | Admitting: Physician Assistant

## 2022-09-03 ENCOUNTER — Ambulatory Visit (INDEPENDENT_AMBULATORY_CARE_PROVIDER_SITE_OTHER): Payer: Medicaid Other | Admitting: Physician Assistant

## 2022-09-03 VITALS — BP 151/89 | HR 109 | Temp 98.4°F | Ht <= 58 in | Wt 139.8 lb

## 2022-09-03 DIAGNOSIS — K922 Gastrointestinal hemorrhage, unspecified: Secondary | ICD-10-CM

## 2022-09-03 DIAGNOSIS — K56699 Other intestinal obstruction unspecified as to partial versus complete obstruction: Secondary | ICD-10-CM

## 2022-09-03 DIAGNOSIS — Z09 Encounter for follow-up examination after completed treatment for conditions other than malignant neoplasm: Secondary | ICD-10-CM

## 2022-09-03 DIAGNOSIS — K633 Ulcer of intestine: Secondary | ICD-10-CM

## 2022-09-03 DIAGNOSIS — K529 Noninfective gastroenteritis and colitis, unspecified: Secondary | ICD-10-CM

## 2022-09-03 NOTE — Patient Instructions (Addendum)
Follow up here in 3 months.   GENERAL POST-OPERATIVE PATIENT INSTRUCTIONS   WOUND CARE INSTRUCTIONS:  Keep a dry clean dressing on the wound if there is drainage. The initial bandage may be removed after 24 hours.  Once the wound has quit draining you may leave it open to air.  If clothing rubs against the wound or causes irritation and the wound is not draining you may cover it with a dry dressing during the daytime.  Try to keep the wound dry and avoid ointments on the wound unless directed to do so.  If the wound becomes bright red and painful or starts to drain infected material that is not clear, please contact your physician immediately.  If the wound is mildly pink and has a thick firm ridge underneath it, this is normal, and is referred to as a healing ridge.  This will resolve over the next 4-6 weeks.  BATHING: You may shower if you have been informed of this by your surgeon. However, Please do not submerge in a tub, hot tub, or pool until incisions are completely sealed or have been told by your surgeon that you may do so.  DIET:  You may eat any foods that you can tolerate.  It is a good idea to eat a high fiber diet and take in plenty of fluids to prevent constipation.  If you do become constipated you may want to take a mild laxative or take ducolax tablets on a daily basis until your bowel habits are regular.  Constipation can be very uncomfortable, along with straining, after recent surgery.  ACTIVITY:  You are encouraged to cough and deep breath or use your incentive spirometer if you were given one, every 15-30 minutes when awake.  This will help prevent respiratory complications and low grade fevers post-operatively if you had a general anesthetic.  You may want to hug a pillow when coughing and sneezing to add additional support to the surgical area, if you had abdominal or chest surgery, which will decrease pain during these times.  You are encouraged to walk and engage in light  activity for the next two weeks.  You should not lift more than 20 pounds for 6 weeks total after surgery as it could put you at increased risk for complications.  Twenty pounds is roughly equivalent to a plastic bag of groceries. At that time- Listen to your body when lifting, if you have pain when lifting, stop and then try again in a few days. Soreness after doing exercises or activities of daily living is normal as you get back in to your normal routine.  MEDICATIONS:  Try to take narcotic medications and anti-inflammatory medications, such as tylenol, ibuprofen, naprosyn, etc., with food.  This will minimize stomach upset from the medication.  Should you develop nausea and vomiting from the pain medication, or develop a rash, please discontinue the medication and contact your physician.  You should not drive, make important decisions, or operate machinery when taking narcotic pain medication.  SUNBLOCK Use sun block to incision area over the next year if this area will be exposed to sun. This helps decrease scarring and will allow you avoid a permanent darkened area over your incision.  QUESTIONS:  Please feel free to call our office if you have any questions, and we will be glad to assist you. [phone]   

## 2022-09-04 NOTE — Progress Notes (Signed)
[facility] POST-OP OFFICE VISIT  09/04/2022  HPI: Darlene Barber is a 57 y.o. female ~1 month s/p hand assisted laparoscopic colectomy subtotal for colonic stricture and GI bleeding with Dr Everlene Farrier   She is overall doing better Still having incisional soreness Also with right hip/right lower quadrant pain; worse with sitting No fever, chills, nausea, emesis Stools are normalized; tolerating PO better No issues with incision Ambulating No other complaints   Vital signs: BP (!) 151/89   Pulse (!) 109   Temp 98.4 F (36.9 C) (Oral)   Ht 4\' 10"  (1.473 m)   Wt 139 lb 12.8 oz (63.4 kg)   SpO2 97%   BMI 29.22 kg/m    Physical Exam: Constitutional: Well appearing female, NAD Abdomen: Soft, non-tender, non-distended, no rebound/guarding Skin: Laparoscopic incisions are healing well, no erythema or drainage   Assessment/Plan: This is a 57 y.o. female ~1 month s/p hand assisted laparoscopic colectomy subtotal for colonic stricture and GI bleeding with Dr Everlene Farrier    - She is doing well  - Pain control prn  - Reviewed wound care recommendation  - Reviewed lifting restrictions; 6 weeks total (2 more weeks)  - I will see her again in 3 months; She understands to call with questions/concerns in the interim  -- Lynden Oxford, PA-C [facility] 09/04/2022, 9:41 AM M-F: 7am - 4pm

## 2022-09-15 NOTE — Progress Notes (Unsigned)
Psychiatric Initial Adult Assessment   Patient Identification: Darlene Barber MRN:  [id number] Date of Evaluation:  09/15/2022 Referral Source: *** Chief Complaint:  No chief complaint on file.  Visit Diagnosis: No diagnosis found.  History of Present Illness:   Darlene Barber is a 57 y.o. year old female with a history of schizoaffective, bypolar type,  hypertension, type II diabetes, CKD stage IV, OSA, GERD, chronic pain, who is referred for bipolar disorder.   According to the chart review, she was admitted for septic shock in Dec 2023, complicated by lower GI blood in the setting of multiple largeulcers of the larget bowerl. She was admittd then for colon stricture s/p subtotal colectomy.   According to the chart review, she was admitted to [facility] for AH/VH and was diagnosedw with schizoaffective disorder.    Rha,  Quetiapine 300 mg On nortlytpine 25 mg am, 50 mg at bedtime, lyrica 25 mg twice a day     Associated Signs/Symptoms: Depression Symptoms:  {DEPRESSION SYMPTOMS:20000} (Hypo) Manic Symptoms:  {BHH MANIC SYMPTOMS:22872} Anxiety Symptoms:  {BHH ANXIETY SYMPTOMS:22873} Psychotic Symptoms:  {BHH PSYCHOTIC SYMPTOMS:22874} PTSD Symptoms: {BHH PTSD SYMPTOMS:22875}  Past Psychiatric History:  Outpatient:  Psychiatry admission:  Previous suicide attempt:  Past trials of medication:  History of violence:  History of head injury:   Previous Psychotropic Medications: {YES/NO:21197}  Substance Abuse History in the last 12 months:  {yes no:314532}  Consequences of Substance Abuse: {BHH CONSEQUENCES OF SUBSTANCE ABUSE:22880}  Past Medical History:  Past Medical History:  Diagnosis Date   Allergy    Anxiety    Bursitis of both hips    Depression    Frequent headaches    Obesity    Sleep apnea    doesn't use CPAP machine broken,     Past Surgical History:  Procedure Laterality Date   BIOPSY  03/30/2022   Procedure: BIOPSY;  Surgeon: Benancio Deeds,  MD;  Location: [facility] ENDOSCOPY;  Service: Gastroenterology;;   CESAREAN SECTION     x2   CHOLECYSTECTOMY     COLONOSCOPY WITH PROPOFOL N/A 11/20/2017   Procedure: COLONOSCOPY WITH PROPOFOL;  Surgeon: Toney Reil, MD;  Location: [facility] ENDOSCOPY;  Service: Gastroenterology;  Laterality: N/A;   COLONOSCOPY WITH PROPOFOL N/A 03/30/2022   Procedure: COLONOSCOPY WITH PROPOFOL;  Surgeon: Benancio Deeds, MD;  Location: [facility] ENDOSCOPY;  Service: Gastroenterology;  Laterality: N/A;   COLONOSCOPY WITH PROPOFOL N/A 08/02/2022   Procedure: COLONOSCOPY WITH PROPOFOL;  Surgeon: Toney Reil, MD;  Location: [facility] ENDOSCOPY;  Service: Gastroenterology;  Laterality: N/A;   ESOPHAGOGASTRODUODENOSCOPY (EGD) WITH PROPOFOL N/A 03/30/2022   Procedure: ESOPHAGOGASTRODUODENOSCOPY (EGD) WITH PROPOFOL;  Surgeon: Benancio Deeds, MD;  Location: [facility] ENDOSCOPY;  Service: Gastroenterology;  Laterality: N/A;   HYSTERECTOMY ABDOMINAL WITH SALPINGECTOMY  1998   HYSTEROSCOPY     LAPAROSCOPIC SUBTOTAL COLECTOMY Right 08/03/2022   Procedure: HAND ASSISTED LAPAROSCOPIC SUBTOTAL COLECTOMY;  Surgeon: Leafy Ro, MD;  Location: [facility] ORS;  Service: General;  Laterality: Right;    Family Psychiatric History: ***  Family History:  Family History  Problem Relation Age of Onset   Hodgkin's lymphoma Mother    Heart failure Maternal Grandmother    Alzheimer's disease Maternal Grandfather    Emphysema Paternal Grandmother    Diabetes Maternal Aunt    Diabetes Maternal Uncle    Cancer Other    Breast cancer Neg Hx     Social History:   Social History   Socioeconomic History   Marital status: Divorced  Spouse name: Not on file   Number of children: Not on file   Years of education: High School   Highest education level: Not on file  Occupational History   Occupation: Unemployed  Tobacco Use   Smoking status: Former    Packs/day: 1.00    Years: 30.00    Additional pack years: 0.00    Total pack  years: 30.00    Types: Cigarettes, Cigars    Passive exposure: Past   Smokeless tobacco: Never  Vaping Use   Vaping Use: Some days  Substance and Sexual Activity   Alcohol use: Yes    Comment: occ once per year   Drug use: Yes    Types: Marijuana    Comment: Current use   Sexual activity: Not Currently  Other Topics Concern   Not on file  Social History Narrative   Not on file   Social Determinants of Health   Financial Resource Strain: Not on file  Food Insecurity: No Food Insecurity (07/31/2022)   Hunger Vital Sign    Worried About Running Out of Food in the Last Year: Never true    Ran Out of Food in the Last Year: Never true  Transportation Needs: No Transportation Needs (07/31/2022)   PRAPARE - Administrator, Civil Service (Medical): No    Lack of Transportation (Non-Medical): No  Physical Activity: Not on file  Stress: Not on file  Social Connections: Not on file    Additional Social History: ***  Allergies:   Allergies  Allergen Reactions   Other Anaphylaxis, Itching, Swelling and Other (See Comments)    Black pepper   Black Pepper-Turmeric    Latex Rash   Silicone Rash   Tape Rash and Other (See Comments)    Paper tape is ok to use.    Metabolic Disorder Labs: Lab Results  Component Value Date   HGBA1C 5.6 03/10/2022   MPG 114 03/10/2022   MPG 126 11/24/2018   No results found for: "PROLACTIN" Lab Results  Component Value Date   CHOL 176 11/24/2018   TRIG 260 (H) 03/20/2022   HDL 38 (L) 11/24/2018   CHOLHDL 4.6 11/24/2018   VLDL 45 (H) 11/12/2016   LDLCALC 103 (H) 11/24/2018   LDLCALC 92 11/19/2017   Lab Results  Component Value Date   TSH 0.601 03/09/2022    Therapeutic Level Labs: No results found for: "LITHIUM" No results found for: "CBMZ" No results found for: "VALPROATE"  Current Medications: Current Outpatient Medications  Medication Sig Dispense Refill   acetaminophen (TYLENOL) 500 MG tablet Take 2 tablets (1,000  mg total) by mouth every 6 (six) hours. 30 tablet 0   acyclovir (ZOVIRAX) 400 MG tablet Take 400 mg by mouth 2 (two) times daily.     albuterol (VENTOLIN HFA) 108 (90 Base) MCG/ACT inhaler INHALE 2 PUFFS EVERY FOUR HOURS AS NEEDED (Patient taking differently: Inhale 2 puffs into the lungs every 4 (four) hours as needed for wheezing or shortness of breath.) 18 g 0   ALPRAZolam (XANAX) 0.25 MG tablet Take 1 tablet (0.25 mg total) by mouth 3 (three) times daily as needed for anxiety. 10 tablet 0   amLODipine (NORVASC) 10 MG tablet Take 1 tablet by mouth daily.     B Complex-C (SUPER B COMPLEX PO) Take 1 tablet by mouth daily with breakfast.     brexpiprazole (REXULTI) 1 MG TABS tablet Take 1 mg by mouth daily.     BREYNA 160-4.5 MCG/ACT inhaler Inhale  2 puffs into the lungs 2 (two) times daily.     Calcium Carbonate Antacid (ANTACID SOFT CHEWS PO) Take 1 tablet by mouth every 6 (six) hours as needed (for indigesation- CHEW).     EMGALITY 120 MG/ML SOSY Inject into the skin every 30 (thirty) days.     fluticasone (FLONASE) 50 MCG/ACT nasal spray 1 SPARY IN EACH NOSTRIL DAILY (Patient taking differently: Place 1 spray into both nostrils daily.) 16 g 0   loratadine (CLARITIN) 10 MG tablet Take 10 mg by mouth in the morning and at bedtime.     nortriptyline (PAMELOR) 25 MG capsule Take 25-50 mg by mouth See admin instructions. Take 25 mg (1 capsule) by mouth every morning, and 50 mg (2 capsules) every night at bedtime     omeprazole (PRILOSEC) 40 MG capsule Take 40 mg by mouth daily before breakfast.     ondansetron (ZOFRAN-ODT) 4 MG disintegrating tablet Take 4 mg by mouth every 8 (eight) hours as needed for nausea.     pregabalin (LYRICA) 25 MG capsule Take 25 mg by mouth in the morning.     Probiotic Product (PROBIOTIC PO) Take 1 capsule by mouth daily.     QUEtiapine (SEROQUEL) 300 MG tablet Take 300 mg by mouth at bedtime.     Saccharomyces boulardii (PROBIOTIC) 250 MG CAPS Take 1 capsule by mouth in  the morning and at bedtime. 30 capsule 0   sertraline (ZOLOFT) 100 MG tablet Take 100 mg by mouth daily.     sodium bicarbonate 650 MG tablet Take 1 tablet (650 mg total) by mouth 3 (three) times daily. 90 tablet 0   sucralfate (CARAFATE) 1 g tablet Take 1 g by mouth See admin instructions. Take 1 gram by mouth in the morning and before supper     tiotropium (SPIRIVA) 18 MCG inhalation capsule Place 1 capsule (18 mcg total) into inhaler and inhale daily. 30 capsule 12   traZODone (DESYREL) 50 MG tablet Take 1 tablet (50 mg total) by mouth at bedtime as needed for sleep. (Patient taking differently: Take 50 mg by mouth at bedtime.) 30 tablet 11   Current Facility-Administered Medications  Medication Dose Route Frequency Provider Last Rate Last Admin   lidocaine HCl (PF) (XYLOCAINE) 2 % injection 50 mL  50 mL Other Once Alfredo Martinez, MD        Musculoskeletal: Strength & Muscle Tone: within normal limits Gait & Station: normal Patient leans: N/A  Psychiatric Specialty Exam: Review of Systems  There were no vitals taken for this visit.There is no height or weight on file to calculate BMI.  General Appearance: {Appearance:22683}  Eye Contact:  {BHH EYE CONTACT:22684}  Speech:  Clear and Coherent  Volume:  Normal  Mood:  {BHH MOOD:22306}  Affect:  {Affect (PAA):22687}  Thought Process:  Coherent  Orientation:  Full (Time, Place, and Person)  Thought Content:  Logical  Suicidal Thoughts:  {ST/HT (PAA):22692}  Homicidal Thoughts:  {ST/HT (PAA):22692}  Memory:  Immediate;   Good  Judgement:  {Judgement (PAA):22694}  Insight:  {Insight (PAA):22695}  Psychomotor Activity:  Normal  Concentration:  Concentration: Good and Attention Span: Good  Recall:  Good  Fund of Knowledge:Good  Language: Good  Akathisia:  No  Handed:  Right  AIMS (if indicated):  not done  Assets:  Communication Skills Desire for Improvement  ADL's:  Intact  Cognition: WNL  Sleep:  {BHH  GOOD/FAIR/POOR:22877}   Screenings: GAD-7    Flowsheet Row Office Visit from 05/26/2017 in  [facility] Office Visit from 08/20/2016 in [facility]  Total GAD-7 Score 13 21      PHQ2-9    Flowsheet Row Office Visit from 09/03/2021 in [facility] Office Visit from 03/28/2020 in [facility] Office Visit from 12/01/2018 in [facility] Office Visit from 08/21/2018 in [facility] Office Visit from 05/27/2018 in [facility]  PHQ-2 Total Score 4 2 1  0 0  PHQ-9 Total Score 15 9 8 7  --      Flowsheet Row ED to Hosp-Admission (Discharged) from 07/30/2022 in [facility] ED from 07/15/2022 in [facility] Emergency Department at [facility] ED from 06/17/2022 in [facility] Emergency Department at [facility]  C-SSRS RISK CATEGORY No Risk Low Risk No Risk       Assessment and Plan:  Assessment  Plan   The patient demonstrates the following risk factors for suicide: Chronic risk factors for suicide include: {Chronic Risk Factors for WUJWJXB:14782956}. Acute risk factors for suicide include: {Acute Risk Factors for OZHYQMV:78469629}. Protective factors for this patient include: {Protective Factors for Suicide BMWU:13244010}. Considering these factors, the overall suicide risk at this point appears to be {Desc; low/moderate/high:110033}. Patient {ACTION; IS/IS UVO:53664403} appropriate for outpatient follow up.   Collaboration of Care: {BH OP Collaboration of Care:21014065}  Patient/Guardian was advised Release of Information must be obtained prior to any record release in order to collaborate their care with an outside provider. Patient/Guardian was advised if they have not already done so to contact the registration department to sign all necessary forms in order  for [place] to release information regarding their care.   Consent: Patient/Guardian gives verbal consent for treatment and assignment of benefits for services provided during this visit. Patient/Guardian expressed understanding and agreed to proceed.   Neysa Hotter, MD 7/7/20247:33 AM

## 2022-09-19 ENCOUNTER — Encounter: Payer: Self-pay | Admitting: Psychiatry

## 2022-09-19 ENCOUNTER — Ambulatory Visit (INDEPENDENT_AMBULATORY_CARE_PROVIDER_SITE_OTHER): Payer: No Typology Code available for payment source | Admitting: Psychiatry

## 2022-09-19 VITALS — BP 158/95 | HR 97 | Temp 97.4°F | Ht <= 58 in | Wt 150.8 lb

## 2022-09-19 DIAGNOSIS — F250 Schizoaffective disorder, bipolar type: Secondary | ICD-10-CM | POA: Diagnosis not present

## 2022-09-19 DIAGNOSIS — F4310 Post-traumatic stress disorder, unspecified: Secondary | ICD-10-CM | POA: Diagnosis not present

## 2022-09-19 DIAGNOSIS — F1290 Cannabis use, unspecified, uncomplicated: Secondary | ICD-10-CM | POA: Diagnosis not present

## 2022-09-19 DIAGNOSIS — G4700 Insomnia, unspecified: Secondary | ICD-10-CM | POA: Diagnosis not present

## 2022-09-19 MED ORDER — SERTRALINE HCL 100 MG PO TABS: 100.0000 mg | ORAL_TABLET | Freq: Every day | ORAL | 1 refills | Status: DC

## 2022-09-19 MED ORDER — QUETIAPINE FUMARATE 400 MG PO TABS: 400.0000 mg | ORAL_TABLET | Freq: Every day | ORAL | 1 refills | Status: DC

## 2022-09-19 NOTE — Patient Instructions (Signed)
Increase quetiapine 400 mg at night  Discontinue Rexulti Restart sertraline 100 mg daily  Continue Trazodone 50 mg at night as needed for insomnia Next appointment: 8/27 at 1:30

## 2022-09-24 ENCOUNTER — Ambulatory Visit: Payer: Medicaid Other | Admitting: Gastroenterology

## 2022-09-24 NOTE — Progress Notes (Deleted)
HPI : seen April 9980:  57 year old female here for a posthospitalization follow-up for colitis, C. difficile, rectal bleeding.   Recall I first met this patient in the hospital when we were consulted on her care.  She was in the hospital from March 10, 2022 through April 23, 2022.  Admitted initially to the ICU with septic shock-respiratory failure requiring pressors/intubation on initial presentation.  She was subsequently was found to have Streptococcus bacteremia.  Hospital course was complicated by AKI requiring CRRT and acute metabolic encephalopathy/ICU delirium.  She was stabilized and transferred to [facility] on 1/17.  Subsequent hospital course complicated by lower GI bleeding with acute blood loss anemia-requiring transfer back to the ICU.  Ultimately she was found to have large ulcers and colitis in her entire colon.  She required multiple units of PRBC transfusion.  She was transferred back to the [facility] service on 1/24 and eventually recovered with supportive care.   Please see below for numerous imaging studies that were done during her course.  She had significant lower GI bleeding.  Initial CTA was negative.  She had worsening renal function leading to CRRT for some time.  Colonoscopy showed numerous large ulcerations in her colon, question ischemic versus infectious.  Biopsies were nondiagnostic.  She required multiple units of blood but eventually stabilized from this and recovered.   Unfortunately she was readmitted 05/28/22 to 06/01/22 when she presented with SIRS secondary to C. difficile infection.  She was treated with vancomycin and eventually improved.  She was just seen yesterday in the ED due to foot and leg swelling however ultrasound was negative for DVT.   She states in general she has had a long recovery from her hospitalizations but does seem to be improving.  She is ambulating with a walker.  She states her stools have definitely formed and she no longer has diarrhea but  does occasionally have some leakage with gas if stools are soft.  She denies any diarrhea or rectal bleeding.  It does not appear she is taking anything for her bowels, she is not taking Imodium as listed in her med list.  She thinks she had a colonoscopy many years ago but nothing recent in advance of her hospitalization.  She denies any family history of colon cancer or IBD.  Her renal function has stabilized but remains impaired with CKD.   As part of her evaluation she had an upper endoscopy to rule out brisk upper GI bleed.  This was negative and her esophagus appeared normal.  She states since her hospitalization she has developed some dysphagia with meats and pills.  Things are getting stuck more frequently and she inquires about what could be causing that.  Otherwise she appears to have recovered from her infection.  She denies any cardiopulmonary symptoms and is feeling better.   Prior workup during hospitalization: 12/30>> CT chest/abdomen/pelvis: Fluid throughout stomach/small bowel/colon-nonspecific 01/05>> CT chest/abdomen/pelvis: Persistent areas of wall thickening along the colon, patchy bilateral ill-defined lung opacities 01/19>> CT abdomen/pelvis: Diarrheal illness with fluid reaching the rectum, colonic wall thickening is improved.  Improving bilateral PNA.  Bilateral nephrolithiasis without obstruction. 01/19>> CT angio GI bleed: Limited evaluation because of oral contrast within the distal small bowel/throughout colon-active GI bleeding could not be assessed. 01/21>> CT angio GI bleed: No active GI bleeding, multiple infectious/inflammatory colitis involving splenic flexure and ascending colon. 02/9 >> CT abdomen pelvis without contras with no acute findings, with interval improvement of previously demonstrated diffuse colonic wall thickening and  surrounding inflammatory changes .     EGD 03/30/22: normal   Colonoscopy 03/30/22: - Preparation of the colon was fair to poor in some  areas due to clot burden. - Multiple ulcers throughout the colon, worst in ascending colon - could not clear entire colon due to clot or localize source of bleeding, suspect she has ulcer related bleeding, perhaps right colon, given burden of clot / blood. EGD was performed after this exam to ensure no upper tract source given location of blood noted throughout and was normal. Unclear if this represents ischemic change vs. viral vs. other - small biopsies taken to assess for this. She is critically ill, no active bleeding following extensive lavage during this exam however as above visualization poor in several areas.   FINAL MICROSCOPIC DIAGNOSIS:   A. COLON, BIOSPY:  -  Scant fragment of granulation tissue consistent ulceration with acute  and chronic inflammation (no mucosal epithelium is present on the biopsy  specimen).  - An immunohistochemical stain for CMV is negative.  -  EBV ISH is negative.  -  An immunohistochemical stain for CK AE1/AE3 is negative (no  epithelium present in the biopsy specimen), negative for malignancy.   Note: There is overall limited tissue for evaluation.  An underlying  etiology for the ulceration/granulation tissue is not identified on the  scant tissue.  The case was discussed with nurse practitioner Merilyn Baba.     57 y.o. female here for assessment of the following   1. Lower GI bleed   2. History of Clostridioides difficile colitis   3. Dysphagia, unspecified type     As above, the patient was quite ill in the hospital with bacteremia sepsis, AKI in need of dialysis.  She developed a significant lower GI bleed secondary to multiple colonic ulcers, nothing focal to treat.  This may have been either ischemic versus infectious although tested negative for infection at the time.  I think IBD would be less likely.  Unfortunately was readmitted with C. difficile following her initial hospitalization and responded appropriately to therapy.   At this time  she is not having any further bleeding, she is having more formed stools although occasionally on the looser side.  She is improving with supportive care, again given her course to date I think IBD much less likely.   If she continues to recover well and can handle it we may consider colonoscopy over the next 3 to 6 months.  I like to see her back in the office in 3 to 4 months for reassessment.  If she has any recurrent diarrhea in the interim she should contact me.  In the interim I like to start her on Benefiber or Citrucel to help bulk her stools, she should take that once daily.   Otherwise she has had some intermittent dysphagia as outlined since her hospitalization.  Her EGD showed no obvious stenosis, question underlying dysmotility.  Offered her a barium study with tablet to see if we can help localize where this is occurring and what is causing it initially.  She is agreeable to this.  If it progressively gets worse in the interim she needs to contact me.     PLAN: - barium swallow with tablet - see if dysmotility vs. subtle stricture, prior EGD looked okay - take daily fiber supplement to form stools - Benefiber or Citrucel preferred - call if any recurrent diarrhea - follow up in the office in 3-4 months - consider colonoscopy at  that time if she has recovered and can handle it    Barium swallow 06/21/22: IMPRESSION: 1. Esophageal dysmotility. 2. Moderate reflux 3. Although there is no focal stenosis, a 16 mm tablet could not pass the GE junction suggesting dysfunction of the sphincter.   Admitted 07/31/22: Since then, patient had several ER visits particularly in month of May for several different reasons, presented to ER yesterday secondary to 2 weeks history of maroon-colored bowel movements, right-sided abdominal pain, labs from 5/20 by her nephrologist revealed hemoglobin of 7, therefore was advised to go to ER.  Patient reports dark maroon-colored stool, ongoing abdominal  pain.  Repeat CT without contrast demonstrated thickening and pericolonic fat stranding of the ascending and transverse colon.  Repeat hemoglobin was 6.5, received blood transfusion, hemoglobin responded appropriately.    Colonoscopy 08/02/22: Ulcerated ascending colon with long stricture  Had resection of R colon per general surgery -  DIAGNOSIS:  A. COLON, RIGHT; SUBTOTAL COLECTOMY:  - TWO AREAS OF STRICTURE INVOLVING THE DISTAL ASCENDING/PROXIMAL  TRANSVERSE COLON AND DISTAL TRANSVERSE/PROXIMAL DESCENDING COLON.  - VIABLE MARGINS OF RESECTION.  - UNREMARKABLE APPENDIX.  - TWENTY BENIGN LYMPH NODES.  - NEGATIVE FOR GRANULOMAS, DYSPLASIA, AND MALIGNANCY.   Comment:  Possible explanations for this patient's strictures include  medication-induced mucosal injury (eg. NSAID, kayexalate etc.) or  ischemia. Histologic features of Crohns disease are not identified.  Correlation with clinical impression is required.     Past Medical History:  Diagnosis Date   Allergy    Anxiety    Bursitis of both hips    CKD (chronic kidney disease) stage 4, GFR 15-29 ml/min (HCC)    Depression    Frequent headaches    Obesity    Sleep apnea    doesn't use CPAP machine broken,      Past Surgical History:  Procedure Laterality Date   BIOPSY  03/30/2022   Procedure: BIOPSY;  Surgeon: Benancio Deeds, MD;  Location: [facility] ENDOSCOPY;  Service: Gastroenterology;;   CESAREAN SECTION     x2   CHOLECYSTECTOMY     COLONOSCOPY WITH PROPOFOL N/A 11/20/2017   Procedure: COLONOSCOPY WITH PROPOFOL;  Surgeon: Toney Reil, MD;  Location: [facility] ENDOSCOPY;  Service: Gastroenterology;  Laterality: N/A;   COLONOSCOPY WITH PROPOFOL N/A 03/30/2022   Procedure: COLONOSCOPY WITH PROPOFOL;  Surgeon: Benancio Deeds, MD;  Location: [facility] ENDOSCOPY;  Service: Gastroenterology;  Laterality: N/A;   COLONOSCOPY WITH PROPOFOL N/A 08/02/2022   Procedure: COLONOSCOPY WITH PROPOFOL;  Surgeon: Toney Reil, MD;   Location: [facility] ENDOSCOPY;  Service: Gastroenterology;  Laterality: N/A;   ESOPHAGOGASTRODUODENOSCOPY (EGD) WITH PROPOFOL N/A 03/30/2022   Procedure: ESOPHAGOGASTRODUODENOSCOPY (EGD) WITH PROPOFOL;  Surgeon: Benancio Deeds, MD;  Location: [facility] ENDOSCOPY;  Service: Gastroenterology;  Laterality: N/A;   HYSTERECTOMY ABDOMINAL WITH SALPINGECTOMY  1998   HYSTEROSCOPY     LAPAROSCOPIC SUBTOTAL COLECTOMY Right 08/03/2022   Procedure: HAND ASSISTED LAPAROSCOPIC SUBTOTAL COLECTOMY;  Surgeon: Leafy Ro, MD;  Location: [facility] ORS;  Service: General;  Laterality: Right;   Family History  Problem Relation Age of Onset   Hodgkin's lymphoma Mother    Diabetes Maternal Aunt    Diabetes Maternal Uncle    Alzheimer's disease Maternal Grandfather    Schizophrenia Maternal Grandmother    Heart failure Maternal Grandmother    Emphysema Paternal Grandmother    Cancer Other    Breast cancer Neg Hx    Social History   Tobacco Use   Smoking status: Former  Current packs/day: 1.00    Average packs/day: 1 pack/day for 30.0 years (30.0 ttl pk-yrs)    Types: Cigarettes, Cigars    Passive exposure: Past   Smokeless tobacco: Never  Vaping Use   Vaping status: Some Days  Substance Use Topics   Alcohol use: Not Currently    Comment: occ once per year   Drug use: Yes    Types: Marijuana    Comment: Current use   Current Outpatient Medications  Medication Sig Dispense Refill   acetaminophen (TYLENOL) 500 MG tablet Take 2 tablets (1,000 mg total) by mouth every 6 (six) hours. 30 tablet 0   acyclovir (ZOVIRAX) 400 MG tablet Take 400 mg by mouth 2 (two) times daily.     albuterol (VENTOLIN HFA) 108 (90 Base) MCG/ACT inhaler INHALE 2 PUFFS EVERY FOUR HOURS AS NEEDED (Patient taking differently: Inhale 2 puffs into the lungs every 4 (four) hours as needed for wheezing or shortness of breath.) 18 g 0   amLODipine (NORVASC) 10 MG tablet Take 1 tablet by mouth daily.     B Complex-C (SUPER B COMPLEX PO)  Take 1 tablet by mouth daily with breakfast.     BREYNA 160-4.5 MCG/ACT inhaler Inhale 2 puffs into the lungs 2 (two) times daily.     Calcium Carbonate Antacid (ANTACID SOFT CHEWS PO) Take 1 tablet by mouth every 6 (six) hours as needed (for indigesation- CHEW).     EMGALITY 120 MG/ML SOSY Inject into the skin every 30 (thirty) days.     fluticasone (FLONASE) 50 MCG/ACT nasal spray 1 SPARY IN EACH NOSTRIL DAILY (Patient taking differently: Place 1 spray into both nostrils daily.) 16 g 0   loratadine (CLARITIN) 10 MG tablet Take 10 mg by mouth in the morning and at bedtime.     metoprolol tartrate (LOPRESSOR) 25 MG tablet Take 25 mg by mouth 2 (two) times daily.     nortriptyline (PAMELOR) 25 MG capsule Take 25-50 mg by mouth See admin instructions. Take 25 mg (1 capsule) by mouth every morning, and 50 mg (2 capsules) every night at bedtime     omeprazole (PRILOSEC) 40 MG capsule Take 40 mg by mouth daily before breakfast.     ondansetron (ZOFRAN-ODT) 4 MG disintegrating tablet Take 4 mg by mouth every 8 (eight) hours as needed for nausea.     pregabalin (LYRICA) 25 MG capsule Take 25 mg by mouth in the morning.     Probiotic Product (PROBIOTIC PO) Take 1 capsule by mouth daily.     QUEtiapine (SEROQUEL) 400 MG tablet Take 1 tablet (400 mg total) by mouth at bedtime. 30 tablet 1   Saccharomyces boulardii (PROBIOTIC) 250 MG CAPS Take 1 capsule by mouth in the morning and at bedtime. 30 capsule 0   sertraline (ZOLOFT) 100 MG tablet Take 1 tablet (100 mg total) by mouth at bedtime. 30 tablet 1   sodium bicarbonate 650 MG tablet Take 1 tablet (650 mg total) by mouth 3 (three) times daily. 90 tablet 0   sucralfate (CARAFATE) 1 g tablet Take 1 g by mouth See admin instructions. Take 1 gram by mouth in the morning and before supper     tiotropium (SPIRIVA) 18 MCG inhalation capsule Place 1 capsule (18 mcg total) into inhaler and inhale daily. 30 capsule 12   traZODone (DESYREL) 50 MG tablet Take 1 tablet  (50 mg total) by mouth at bedtime as needed for sleep. (Patient taking differently: Take 50 mg by mouth at bedtime.) 30  tablet 11   Current Facility-Administered Medications  Medication Dose Route Frequency Provider Last Rate Last Admin   lidocaine HCl (PF) (XYLOCAINE) 2 % injection 50 mL  50 mL Other Once Alfredo Martinez, MD       Allergies  Allergen Reactions   Other Anaphylaxis, Itching, Swelling and Other (See Comments)    Black pepper   Black Pepper-Turmeric    Latex Rash   Silicone Rash   Tape Rash and Other (See Comments)    Paper tape is ok to use.     Review of Systems: All systems reviewed and negative except where noted in HPI.    No results found.  Physical Exam: There were no vitals taken for this visit. Constitutional: Pleasant,well-developed, ***female in no acute distress. HEENT: Normocephalic and atraumatic. Conjunctivae are normal. No scleral icterus. Neck supple.  Cardiovascular: Normal rate, regular rhythm.  Pulmonary/chest: Effort normal and breath sounds normal. No wheezing, rales or rhonchi. Abdominal: Soft, nondistended, nontender. Bowel sounds active throughout. There are no masses palpable. No hepatomegaly. Extremities: no edema Lymphadenopathy: No cervical adenopathy noted. Neurological: Alert and oriented to person place and time. Skin: Skin is warm and dry. No rashes noted. Psychiatric: Normal mood and affect. Behavior is normal.   ASSESSMENT: 57 y.o. female here for assessment of the following  No diagnosis found.  PLAN:   Gracelyn Nurse, MD

## 2022-09-26 ENCOUNTER — Other Ambulatory Visit: Payer: Self-pay

## 2022-09-26 MED ORDER — ACYCLOVIR 400 MG PO TABS
400.0000 mg | ORAL_TABLET | Freq: Three times a day (TID) | ORAL | 3 refills | Status: AC
  Filled 2022-09-26 – 2022-10-18 (×2): qty 180, 60d supply, fill #0
  Filled 2022-12-20: qty 180, 60d supply, fill #1
  Filled 2023-03-11: qty 180, 60d supply, fill #2
  Filled 2023-04-25 – 2023-05-09 (×2): qty 90, 30d supply, fill #3

## 2022-09-26 MED ORDER — ONDANSETRON 4 MG PO TBDP
4.0000 mg | ORAL_TABLET | Freq: Three times a day (TID) | ORAL | 3 refills | Status: DC | PRN
  Filled 2022-09-26: qty 20, 7d supply, fill #0
  Filled 2022-10-18: qty 20, 7d supply, fill #1
  Filled 2022-11-20: qty 20, 7d supply, fill #2

## 2022-09-26 MED ORDER — EMGALITY 120 MG/ML ~~LOC~~ SOAJ
120.0000 mg | SUBCUTANEOUS | 5 refills | Status: DC
  Filled 2022-09-26 – 2022-10-04 (×2): qty 1, 30d supply, fill #0
  Filled 2022-11-06: qty 1, 30d supply, fill #1
  Filled 2022-12-16 – 2023-01-08 (×6): qty 1, 30d supply, fill #2

## 2022-09-27 ENCOUNTER — Other Ambulatory Visit: Payer: Self-pay

## 2022-09-30 ENCOUNTER — Ambulatory Visit: Payer: Self-pay | Admitting: Urology

## 2022-09-30 ENCOUNTER — Other Ambulatory Visit: Payer: Self-pay

## 2022-10-04 ENCOUNTER — Other Ambulatory Visit: Payer: Self-pay

## 2022-10-04 ENCOUNTER — Other Ambulatory Visit (HOSPITAL_COMMUNITY): Payer: Self-pay

## 2022-10-07 ENCOUNTER — Other Ambulatory Visit: Payer: Self-pay

## 2022-10-08 ENCOUNTER — Ambulatory Visit (INDEPENDENT_AMBULATORY_CARE_PROVIDER_SITE_OTHER): Payer: Medicaid Other

## 2022-10-08 ENCOUNTER — Other Ambulatory Visit (HOSPITAL_COMMUNITY)
Admission: RE | Admit: 2022-10-08 | Discharge: 2022-10-08 | Disposition: A | Discharge status: Home / Self Care | Payer: Medicaid Other | Source: Ambulatory Visit | Attending: Licensed Practical Nurse | Admitting: Licensed Practical Nurse

## 2022-10-08 VITALS — BP 135/82 | Ht <= 58 in | Wt 150.2 lb

## 2022-10-08 DIAGNOSIS — N898 Other specified noninflammatory disorders of vagina: Secondary | ICD-10-CM

## 2022-10-08 DIAGNOSIS — R300 Dysuria: Secondary | ICD-10-CM

## 2022-10-08 LAB — POCT URINALYSIS DIPSTICK
Appearance: NORMAL
Bilirubin, UA: NEGATIVE
Blood, UA: NEGATIVE
Glucose, UA: NEGATIVE
Ketones, UA: NEGATIVE
Leukocytes, UA: NEGATIVE
Nitrite, UA: NEGATIVE
Odor: NORMAL
Protein, UA: NEGATIVE
Spec Grav, UA: 1.01 (ref 1.010–1.025)
Urobilinogen, UA: 0.2 E.U./dL
pH, UA: 5 (ref 5.0–8.0)

## 2022-10-08 NOTE — Progress Notes (Signed)
    NURSE VISIT NOTE  Subjective:    Patient ID: Darlene Barber, female    DOB: 10-27-1965, 57 y.o.   MRN: [id number]  HPI  Patient is a 57 y.o. [phone] female who presents for vaginal irritation for 2 month(s). Denies abnormal vaginal bleeding or significant pelvic pain or fever. admits to dysuria, genital irritation, and itching . Patient has history of known exposure to STD. Patient reports she had a recent hospitalization in May for colon resection, she complains about hot flashes, a bump/lump in the vaginal area that hasn't always been there. She also complains of hemorrhoids.   Objective:    BP 135/82   Ht 4\' 10"  (1.473 m)   Wt 150 lb 3.2 oz (68.1 kg)   BMI 31.39 kg/m    @THIS  VISIT ONLY@  Assessment:   1. Vaginal irritation   2. Vaginal itching   3. Dysuria     nonspecific vaginitis  Plan:   GC and chlamydia DNA  probe sent to lab. Treatment: OTC yeast cream such as Monistat or Gyne-Lotrimin. Will call with results. Patient states she does not check my chart. ROV prn if symptoms persist or worsen. Patient advised to schedule annual in one month to discuss other issues.   Rocco Serene, LPN

## 2022-10-08 NOTE — Patient Instructions (Addendum)
Vaginal Yeast Infection, Adult  Vaginal yeast infection is a condition that causes vaginal discharge as well as soreness, swelling, and redness (inflammation) of the vagina. This is a common condition. Some women get this infection frequently. What are the causes? This condition is caused by a change in the normal balance of the yeast (Candida) and normal bacteria that live in the vagina. This change causes an overgrowth of yeast, which causes the inflammation. What increases the risk? The condition is more likely to develop in women who: Take antibiotic medicines. Have diabetes. Take birth control pills. Are pregnant. Douche often. Have a weak body defense system (immune system). Have been taking steroid medicines for a long time. Frequently wear tight clothing. What are the signs or symptoms? Symptoms of this condition include: White, thick, creamy vaginal discharge. Swelling, itching, redness, and irritation of the vagina. The lips of the vagina (labia) may be affected as well. Pain or a burning feeling while urinating. Pain during sex. How is this diagnosed? This condition is diagnosed based on: Your medical history. A physical exam. A pelvic exam. Your health care provider will examine a sample of your vaginal discharge under a microscope. Your health care provider may send this sample for testing to confirm the diagnosis. How is this treated? This condition is treated with medicine. Medicines may be over-the-counter or prescription. You may be told to use one or more of the following: Medicine that is taken by mouth (orally). Medicine that is applied as a cream (topically). Medicine that is inserted directly into the vagina (suppository). Follow these instructions at home: Take or apply over-the-counter and prescription medicines only as told by your health care provider. Do not use tampons until your health care provider approves. Do not have sex until your infection has  cleared. Sex can prolong or worsen your symptoms of infection. Ask your health care provider when it is safe to resume sexual activity. Keep all follow-up visits. This is important. How is this prevented?  Do not wear tight clothes, such as pantyhose or tight pants. Wear breathable cotton underwear. Do not use douches, perfumed soap, creams, or powders. Wipe from front to back after using the toilet. If you have diabetes, keep your blood sugar levels under control. Ask your health care provider for other ways to prevent yeast infections. Contact a health care provider if: You have a fever. Your symptoms go away and then return. Your symptoms do not get better with treatment. Your symptoms get worse. You have new symptoms. You develop blisters in or around your vagina. You have blood coming from your vagina and it is not your menstrual period. You develop pain in your abdomen. Summary Vaginal yeast infection is a condition that causes discharge as well as soreness, swelling, and redness (inflammation) of the vagina. This condition is treated with medicine. Medicines may be over-the-counter or prescription. Take or apply over-the-counter and prescription medicines only as told by your health care provider. Do not douche. Resume sexual activity or use of tampons as instructed by your health care provider. Contact a health care provider if your symptoms do not get better with treatment or your symptoms go away and then return. This information is not intended to replace advice given to you by your health care provider. Make sure you discuss any questions you have with your health care provider. Document Revised: 05/15/2020 Document Reviewed: 05/15/2020 Elsevier Patient Education  2024 [employer] Vaginal Yeast Infection, Adult  Vaginal yeast infection is a condition that  causes vaginal discharge as well as soreness, swelling, and redness (inflammation) of the vagina. This is a common  condition. Some women get this infection frequently. What are the causes? This condition is caused by a change in the normal balance of the yeast (Candida) and normal bacteria that live in the vagina. This change causes an overgrowth of yeast, which causes the inflammation. What increases the risk? The condition is more likely to develop in women who: Take antibiotic medicines. Have diabetes. Take birth control pills. Are pregnant. Douche often. Have a weak body defense system (immune system). Have been taking steroid medicines for a long time. Frequently wear tight clothing. What are the signs or symptoms? Symptoms of this condition include: White, thick, creamy vaginal discharge. Swelling, itching, redness, and irritation of the vagina. The lips of the vagina (labia) may be affected as well. Pain or a burning feeling while urinating. Pain during sex. How is this diagnosed? This condition is diagnosed based on: Your medical history. A physical exam. A pelvic exam. Your health care provider will examine a sample of your vaginal discharge under a microscope. Your health care provider may send this sample for testing to confirm the diagnosis. How is this treated? This condition is treated with medicine. Medicines may be over-the-counter or prescription. You may be told to use one or more of the following: Medicine that is taken by mouth (orally). Medicine that is applied as a cream (topically). Medicine that is inserted directly into the vagina (suppository). Follow these instructions at home: Take or apply over-the-counter and prescription medicines only as told by your health care provider. Do not use tampons until your health care provider approves. Do not have sex until your infection has cleared. Sex can prolong or worsen your symptoms of infection. Ask your health care provider when it is safe to resume sexual activity. Keep all follow-up visits. This is important. How is this  prevented?  Do not wear tight clothes, such as pantyhose or tight pants. Wear breathable cotton underwear. Do not use douches, perfumed soap, creams, or powders. Wipe from front to back after using the toilet. If you have diabetes, keep your blood sugar levels under control. Ask your health care provider for other ways to prevent yeast infections. Contact a health care provider if: You have a fever. Your symptoms go away and then return. Your symptoms do not get better with treatment. Your symptoms get worse. You have new symptoms. You develop blisters in or around your vagina. You have blood coming from your vagina and it is not your menstrual period. You develop pain in your abdomen. Summary Vaginal yeast infection is a condition that causes discharge as well as soreness, swelling, and redness (inflammation) of the vagina. This condition is treated with medicine. Medicines may be over-the-counter or prescription. Take or apply over-the-counter and prescription medicines only as told by your health care provider. Do not douche. Resume sexual activity or use of tampons as instructed by your health care provider. Contact a health care provider if your symptoms do not get better with treatment or your symptoms go away and then return. This information is not intended to replace advice given to you by your health care provider. Make sure you discuss any questions you have with your health care provider. Document Revised: 05/15/2020 Document Reviewed: 05/15/2020 Elsevier Patient Education  2024 [employer]  Hemorrhoids Hemorrhoids are swollen veins in and around the rectum or the opening of the butt (anus). There are two types of  hemorrhoids: Internal. These occur in the veins just inside the rectum. They may poke through to the outside and become irritated and painful. External. These occur in the veins outside the anus. They can be felt as a painful swelling or hard lump near the  anus. Most hemorrhoids do not cause severe problems. Often, they can be treated at home with diet and lifestyle changes. If home treatments do not help, you may need a procedure to shrink or remove the hemorrhoids. What are the causes? Hemorrhoids are caused by pressure near the anus. This pressure may be caused by: Constipation or diarrhea. Straining to poop. Pregnancy. Obesity. Sitting or riding a bike for a long time. Heavy lifting or other things that cause you to strain. Anal sex. What are the signs or symptoms? Symptoms of this condition include: Pain. Anal itching or irritation. Bleeding from the rectum. Leakage of poop (stool). Swelling of the anus. One or more lumps around the anus. How is this diagnosed? Hemorrhoids can often be diagnosed through a visual exam. Other exams or tests may also be done, such as: A digital rectal exam. This is when your health care provider feels inside your rectum with a gloved finger. Anoscope. This is an exam of the anus using a small tube. A blood test, if you have lost a lot of blood. A sigmoidoscopy or colonoscopy. These are tests to look inside the colon using a tube with a camera on the end. How is this treated? In most cases, hemorrhoids can be treated at home with diet and lifestyle changes. If these changes do not help, you may need to have a procedure done. These procedures can make the hemorrhoids smaller or fully remove them. Common procedures include: Rubber band ligation. Rubber bands are placed at the base of the hemorrhoids to cut off their blood supply. Sclerotherapy. Medicine is put into the hemorrhoids to shrink them. Infrared coagulation. A type of light energy is used to get rid of the hemorrhoids. Hemorrhoidectomy surgery. The hemorrhoids are removed during surgery. Then, the veins that supply them are tied off. Stapled hemorrhoidopexy surgery. The base of the hemorrhoid is stapled to the wall of the rectum. Follow these  instructions at home: Medicines Take over-the-counter and prescription medicines only as told by your provider. Use medicated creams or medicines that are put in the rectum (suppositories) as told by your provider. Eating and drinking  Eat foods that are high in fiber, such as beans, whole grains, and fresh fruits and vegetables. Ask your provider about taking products that have fiber added to them (fiber supplements). Reduce the amount of fat in your diet. You can do this by eating low-fat dairy products, eating less red meat, and avoiding processed foods. Drink enough fluid to keep your pee (urine) pale yellow. Managing pain and swelling  Take warm sitz baths for 20 minutes, 3-4 times a day. This can help ease pain and discomfort. You may do this in a bathtub or you can use a portable sitz bath that fits over the toilet. If told, put ice on the affected area. It may help to use ice packs between sitz baths. Put ice in a plastic bag. Place a towel between your skin and the bag. Leave the ice on for 20 minutes, 2-3 times a day. If your skin turns bright red, remove the ice right away to prevent skin damage. The risk of damage is higher if you cannot feel pain, heat, or cold. General instructions Exercise. Ask your  provider how much and what kind of exercise is best for you. In general, you should do moderate exercise for at least 30 minutes on most days of the week (150 minutes each week). You may want to try walking, biking, or yoga. Go to the bathroom when you have the urge to poop. Do not wait. Avoid straining to poop. Keep the anus dry and clean. Use wet toilet paper or moist towelettes after you poop. Do not sit on the toilet for a long time. This can increase blood pooling and pain. Where to find more information [employer]: [email or web address] Contact a health care provider if: You have more pain and swelling that do not get better  with treatment. You have trouble pooping or you are not able to poop. You have pain or inflammation outside the area of the hemorrhoids. Get help right away if: You are bleeding from your rectum and you cannot get it to stop. This information is not intended to replace advice given to you by your health care provider. Make sure you discuss any questions you have with your health care provider. Document Revised: 11/07/2021 Document Reviewed: 11/07/2021 Elsevier Patient Education  2024 [employer]  Managing Hot Flashes During Menopause You will learn what hot flashes/night sweats are, what causes them and what the treatment methods are. To view the content, go to this web address: [email or web address]  This video will expire on: 09/01/2024. If you need access to this video following this date, please reach out to the healthcare provider who assigned it to you. This information is not intended to replace advice given to you by your health care provider. Make sure you discuss any questions you have with your health care provider. Elsevier Patient Education  2024 [employer].

## 2022-10-10 ENCOUNTER — Other Ambulatory Visit (HOSPITAL_COMMUNITY): Payer: Self-pay

## 2022-10-10 ENCOUNTER — Other Ambulatory Visit: Payer: Self-pay

## 2022-10-10 DIAGNOSIS — B9689 Other specified bacterial agents as the cause of diseases classified elsewhere: Secondary | ICD-10-CM

## 2022-10-10 MED ORDER — METRONIDAZOLE 500 MG PO TABS
500.0000 mg | ORAL_TABLET | Freq: Two times a day (BID) | ORAL | 0 refills | Status: DC
Start: 2022-10-10 — End: 2022-12-03
  Filled 2022-10-10: qty 14, 7d supply, fill #0

## 2022-10-10 NOTE — Progress Notes (Signed)
Pls tx pt. Thx.

## 2022-10-14 ENCOUNTER — Other Ambulatory Visit (INDEPENDENT_AMBULATORY_CARE_PROVIDER_SITE_OTHER): Payer: Self-pay | Admitting: Nurse Practitioner

## 2022-10-14 DIAGNOSIS — N184 Chronic kidney disease, stage 4 (severe): Secondary | ICD-10-CM

## 2022-10-16 ENCOUNTER — Other Ambulatory Visit: Payer: Self-pay

## 2022-10-16 ENCOUNTER — Ambulatory Visit (INDEPENDENT_AMBULATORY_CARE_PROVIDER_SITE_OTHER): Payer: Medicaid Other | Admitting: Nurse Practitioner

## 2022-10-16 ENCOUNTER — Ambulatory Visit (INDEPENDENT_AMBULATORY_CARE_PROVIDER_SITE_OTHER): Payer: Medicaid Other

## 2022-10-16 ENCOUNTER — Encounter (INDEPENDENT_AMBULATORY_CARE_PROVIDER_SITE_OTHER): Payer: Self-pay | Admitting: Nurse Practitioner

## 2022-10-16 VITALS — BP 156/94 | HR 69 | Resp 16 | Wt 148.8 lb

## 2022-10-16 DIAGNOSIS — N184 Chronic kidney disease, stage 4 (severe): Secondary | ICD-10-CM

## 2022-10-16 DIAGNOSIS — I10 Essential (primary) hypertension: Secondary | ICD-10-CM | POA: Diagnosis not present

## 2022-10-16 MED ORDER — FLUTICASONE PROPIONATE 50 MCG/ACT NA SUSP
2.0000 | Freq: Every day | NASAL | 5 refills | Status: DC
  Filled 2022-10-16 – 2022-10-18 (×2): qty 16, 30d supply, fill #0

## 2022-10-16 MED ORDER — OMEPRAZOLE 40 MG PO CPDR
40.0000 mg | DELAYED_RELEASE_CAPSULE | Freq: Two times a day (BID) | ORAL | 1 refills | Status: DC | PRN
  Filled 2022-10-16: qty 60, 30d supply, fill #0

## 2022-10-16 MED ORDER — TIOTROPIUM BROMIDE MONOHYDRATE 18 MCG IN CAPS
18.0000 ug | ORAL_CAPSULE | Freq: Every day | RESPIRATORY_TRACT | 5 refills | Status: DC
  Filled 2022-10-16 – 2022-10-18 (×2): qty 30, 30d supply, fill #0

## 2022-10-17 ENCOUNTER — Encounter (INDEPENDENT_AMBULATORY_CARE_PROVIDER_SITE_OTHER): Payer: Self-pay | Admitting: Nurse Practitioner

## 2022-10-17 ENCOUNTER — Other Ambulatory Visit: Payer: Self-pay

## 2022-10-17 NOTE — Progress Notes (Signed)
Subjective:    Patient ID: Darlene Barber, female    DOB: [birth year], 57 y.o.   MRN: [id number] Chief Complaint  Patient presents with   New Patient (Initial Visit)    Ref [name] consult vein mapp CKD4    Darlene Barber is a 57 year old female that presents today as a referral from Dr. [name] in regards to vein mapping and dialysis placement.  Initially the patient felt that the vein mapping was to determine placement for IV access the next time that she is hospitalized.  I discussed with the patient the purpose of vein mapping and how it is used for planning a permanent access in the setting of needing to undergo hemodialysis.  The patient revealed that she did not wish to go on dialysis and she would prefer death before Barber on dialysis.  She is unaware of of how soon she may require dialysis.  She notes that she was recently hospitalized and was very ill and had a colon resection during that time.  She notes that due to her experiences there she wishes to be DNR to save her family for making any difficult decisions if this were to happen again.  Her studies today do reveal that she has a very small vessel for placement of a fistula.  If she did undergo access placement she will need a left upper extremity brachial axillary AV graft, given that she is right-handed.    Review of Systems  Psychiatric/Behavioral:  The patient is nervous/anxious.   All other systems reviewed and are negative.      Objective:   Physical Exam Vitals reviewed.  HENT:     Head: Normocephalic.  Cardiovascular:     Rate and Rhythm: Normal rate.     Pulses:          Radial pulses are 2+ on the left side.  Skin:    General: Skin is warm and dry.  Neurological:     Mental Status: She is alert and oriented to person, place, and time.  Psychiatric:        Mood and Affect: Mood normal.        Behavior: Behavior normal.        Thought Content: Thought content normal.        Judgment: Judgment normal.      BP (!) 156/94 (BP Location: Left Arm)   Pulse 69   Resp 16   Wt 148 lb 12.8 oz (67.5 kg)   BMI 31.10 kg/m   Past Medical History:  Diagnosis Date   Allergy    Anxiety    Bursitis of both hips    CKD (chronic kidney disease) stage 4, GFR 15-29 ml/min (HCC)    Depression    Frequent headaches    Obesity    Sleep apnea    doesn't use CPAP machine broken,     Social History   Socioeconomic History   Marital status: Divorced    Spouse name: Not on file   Number of children: 3   Years of education: High School   Highest education level: GED or equivalent  Occupational History   Occupation: Unemployed  Tobacco Use   Smoking status: Former    Current packs/day: 1.00    Average packs/day: 1 pack/day for 30.0 years (30.0 ttl pk-yrs)    Types: Cigarettes, Cigars    Passive exposure: Past   Smokeless tobacco: Never  Vaping Use   Vaping status: Some Days  Substance and Sexual Activity  Alcohol use: Not Currently    Comment: occ once per year   Drug use: Yes    Types: Marijuana    Comment: Current use   Sexual activity: Not Currently  Other Topics Concern   Not on file  Social History Narrative   Not on file   Social Determinants of Health   Financial Resource Strain: Not on file  Food Insecurity: No Food Insecurity (07/31/2022)   Hunger Vital Sign    Worried About Running Out of Food in the Last Year: Never true    Ran Out of Food in the Last Year: Never true  Transportation Needs: No Transportation Needs (07/31/2022)   PRAPARE - Administrator, Civil Service (Medical): No    Lack of Transportation (Non-Medical): No  Physical Activity: Not on file  Stress: Not on file  Social Connections: Not on file  Intimate Partner Violence: Not At Risk (07/31/2022)   Humiliation, Afraid, Rape, and Kick questionnaire    Fear of Current or Ex-Partner: No    Emotionally Abused: No    Physically Abused: No    Sexually Abused: No    Past Surgical History:   Procedure Laterality Date   BIOPSY  03/30/2022   Procedure: BIOPSY;  Surgeon: Benancio Deeds, MD;  Location: [facility] ENDOSCOPY;  Service: Gastroenterology;;   CESAREAN SECTION     x2   CHOLECYSTECTOMY     COLONOSCOPY WITH PROPOFOL N/A 11/20/2017   Procedure: COLONOSCOPY WITH PROPOFOL;  Surgeon: Toney Reil, MD;  Location: [facility] ENDOSCOPY;  Service: Gastroenterology;  Laterality: N/A;   COLONOSCOPY WITH PROPOFOL N/A 03/30/2022   Procedure: COLONOSCOPY WITH PROPOFOL;  Surgeon: Benancio Deeds, MD;  Location: [facility] ENDOSCOPY;  Service: Gastroenterology;  Laterality: N/A;   COLONOSCOPY WITH PROPOFOL N/A 08/02/2022   Procedure: COLONOSCOPY WITH PROPOFOL;  Surgeon: Toney Reil, MD;  Location: [facility] ENDOSCOPY;  Service: Gastroenterology;  Laterality: N/A;   ESOPHAGOGASTRODUODENOSCOPY (EGD) WITH PROPOFOL N/A 03/30/2022   Procedure: ESOPHAGOGASTRODUODENOSCOPY (EGD) WITH PROPOFOL;  Surgeon: Benancio Deeds, MD;  Location: [facility] ENDOSCOPY;  Service: Gastroenterology;  Laterality: N/A;   HYSTERECTOMY ABDOMINAL WITH SALPINGECTOMY  1998   HYSTEROSCOPY     LAPAROSCOPIC SUBTOTAL COLECTOMY Right 08/03/2022   Procedure: HAND ASSISTED LAPAROSCOPIC SUBTOTAL COLECTOMY;  Surgeon: Leafy Ro, MD;  Location: [facility] ORS;  Service: General;  Laterality: Right;    Family History  Problem Relation Age of Onset   Hodgkin's lymphoma Mother    Diabetes Maternal Aunt    Diabetes Maternal Uncle    Alzheimer's disease Maternal Grandfather    Schizophrenia Maternal Grandmother    Heart failure Maternal Grandmother    Emphysema Paternal Grandmother    Cancer Other    Breast cancer Neg Hx     Allergies  Allergen Reactions   Other Anaphylaxis, Itching, Swelling and Other (See Comments)    Black pepper   Black Pepper-Turmeric    Latex Rash   Silicone Rash   Tape Rash and Other (See Comments)    Paper tape is ok to use.       Latest Ref Rng & Units 08/13/2022    3:49 PM 08/06/2022    4:52 AM  08/05/2022    4:22 AM  CBC  WBC 4.0 - 10.5 K/uL 10.3  9.9  11.5   Hemoglobin 12.0 - 15.0 g/dL 9.4  8.7  9.5   Hematocrit 36.0 - 46.0 % 29.5  26.5  29.2   Platelets 150 - 400 K/uL 676  344  342       CMP     Component Value Date/Time   NA 142 08/13/2022 1549   NA 137 04/20/2012 1725   K 3.8 08/13/2022 1549   K 4.2 04/20/2012 1725   CL 111 08/13/2022 1549   CL 106 04/20/2012 1725   CO2 23 08/13/2022 1549   CO2 24 04/20/2012 1725   GLUCOSE 108 (H) 08/13/2022 1549   GLUCOSE 97 04/20/2012 1725   BUN 16 08/13/2022 1549   BUN 14 04/20/2012 1725   CREATININE 2.19 (H) 08/13/2022 1549   CREATININE 0.83 09/30/2019 1057   CALCIUM 8.6 (L) 08/13/2022 1549   CALCIUM 9.2 04/20/2012 1725   PROT 6.3 (L) 08/13/2022 1549   PROT 7.9 04/20/2012 1725   ALBUMIN 2.4 (L) 08/13/2022 1549   ALBUMIN 4.0 04/20/2012 1725   AST 11 (L) 08/13/2022 1549   AST 26 04/20/2012 1725   ALT 9 08/13/2022 1549   ALT 39 04/20/2012 1725   ALKPHOS 84 08/13/2022 1549   ALKPHOS 123 04/20/2012 1725   BILITOT 0.4 08/13/2022 1549   BILITOT 0.5 04/20/2012 1725   GFRNONAA 26 (L) 08/13/2022 1549   GFRNONAA 81 09/30/2019 1057     No results found.     Assessment & Plan:   1. CKD (chronic kidney disease), stage IV (HCC) I had a discussion with the patient regarding the purpose of the vein mapping as well as the difference between a fistula and a graft.  The patient has very small vessels and would require a left upper extremity brachial axillary graft if she decided to move forward with access.  However following my discussion with the patient we even discussed peritoneal dialysis versus hemodialysis however based on her colon resection and recent surgery I do not know that she would be a good candidate for peritoneal dialysis.  Ultimately, with our discussion the patient notes that she does not want to undergo dialysis at all and would prefer to perish versus undergoing any sort of dialysis.  She requested a DNR which I  have directed her towards her PCP as they may be able to help with development of a DNR and possible living will.  I have advised the patient to discuss her long-term outlook with her nephrologist and if ultimately should she wants to move forward with dialysis placement then would be happy to place a graft however because it is a graft we typically do asked that the plan would be that it is used within 6 months or less to prevent occlusion or long-term issues with the graft itself.  Otherwise if she decides not to move forward we can see her as needed.  2. Essential hypertension Continue antihypertensive medications as already ordered, these medications have been reviewed and there are no changes at this time.   Current Outpatient Medications on File Prior to Visit  Medication Sig Dispense Refill   acetaminophen (TYLENOL) 500 MG tablet Take 2 tablets (1,000 mg total) by mouth every 6 (six) hours. 30 tablet 0   acyclovir (ZOVIRAX) 400 MG tablet Take 400 mg by mouth 2 (two) times daily.     acyclovir (ZOVIRAX) 400 MG tablet Take 1 tablet (400 mg total) by mouth every 8 (eight) hours. 180 tablet 3   albuterol (VENTOLIN HFA) 108 (90 Base) MCG/ACT inhaler INHALE 2 PUFFS EVERY FOUR HOURS AS NEEDED (Patient taking differently: Inhale 2 puffs into the lungs every 4 (four) hours as needed for wheezing or shortness of breath.) 18 g 0  amLODipine (NORVASC) 10 MG tablet Take 1 tablet by mouth daily.     BREYNA 160-4.5 MCG/ACT inhaler Inhale 2 puffs into the lungs 2 (two) times daily.     EMGALITY 120 MG/ML SOSY Inject into the skin every 30 (thirty) days.     fluticasone (FLONASE) 50 MCG/ACT nasal spray 1 SPARY IN EACH NOSTRIL DAILY (Patient taking differently: Place 1 spray into both nostrils daily.) 16 g 0   Galcanezumab-gnlm (EMGALITY) 120 MG/ML SOAJ Inject 120 mg into the skin every 30 (thirty) days. 1 mL 5   loratadine (CLARITIN) 10 MG tablet Take 10 mg by mouth in the morning and at bedtime.      metoprolol tartrate (LOPRESSOR) 25 MG tablet Take 25 mg by mouth 2 (two) times daily.     metroNIDAZOLE (FLAGYL) 500 MG tablet Take 1 tablet (500 mg total) by mouth 2 (two) times daily. 14 tablet 0   nortriptyline (PAMELOR) 25 MG capsule Take 25-50 mg by mouth See admin instructions. Take 25 mg (1 capsule) by mouth every morning, and 50 mg (2 capsules) every night at bedtime     omeprazole (PRILOSEC) 40 MG capsule Take 40 mg by mouth daily before breakfast.     ondansetron (ZOFRAN-ODT) 4 MG disintegrating tablet Take 4 mg by mouth every 8 (eight) hours as needed for nausea.     ondansetron (ZOFRAN-ODT) 4 MG disintegrating tablet Take 1 tablet (4 mg total) by mouth every 8 (eight) hours as needed for up to 40 days 20 tablet 3   pregabalin (LYRICA) 25 MG capsule Take 25 mg by mouth in the morning.     Probiotic Product (PROBIOTIC PO) Take 1 capsule by mouth daily.     QUEtiapine (SEROQUEL) 400 MG tablet Take 1 tablet (400 mg total) by mouth at bedtime. 30 tablet 1   Saccharomyces boulardii (PROBIOTIC) 250 MG CAPS Take 1 capsule by mouth in the morning and at bedtime. 30 capsule 0   sertraline (ZOLOFT) 100 MG tablet Take 1 tablet (100 mg total) by mouth at bedtime. 30 tablet 1   sodium bicarbonate 650 MG tablet Take 1 tablet (650 mg total) by mouth 3 (three) times daily. 90 tablet 0   sucralfate (CARAFATE) 1 g tablet Take 1 g by mouth See admin instructions. Take 1 gram by mouth in the morning and before supper     tiotropium (SPIRIVA) 18 MCG inhalation capsule Place 1 capsule (18 mcg total) into inhaler and inhale daily. 30 capsule 12   traZODone (DESYREL) 50 MG tablet Take 1 tablet (50 mg total) by mouth at bedtime as needed for sleep. (Patient taking differently: Take 50 mg by mouth at bedtime.) 30 tablet 11   B Complex-C (SUPER B COMPLEX PO) Take 1 tablet by mouth daily with breakfast. (Patient not taking: Reported on 10/08/2022)     Calcium Carbonate Antacid (ANTACID SOFT CHEWS PO) Take 1 tablet by  mouth every 6 (six) hours as needed (for indigesation- CHEW). (Patient not taking: Reported on 10/08/2022)     Current Facility-Administered Medications on File Prior to Visit  Medication Dose Route Frequency Provider Last Rate Last Admin   lidocaine HCl (PF) (XYLOCAINE) 2 % injection 50 mL  50 mL Other Once Alfredo Martinez, MD        There are no Patient Instructions on file for this visit. No follow-ups on file.   Georgiana Spinner, NP

## 2022-10-18 ENCOUNTER — Other Ambulatory Visit: Payer: Self-pay

## 2022-10-21 ENCOUNTER — Other Ambulatory Visit: Payer: Self-pay

## 2022-10-21 MED ORDER — FLUTICASONE PROPIONATE 50 MCG/ACT NA SUSP
2.0000 | Freq: Every day | NASAL | 5 refills | Status: DC
  Filled 2022-10-21: qty 16, 60d supply, fill #0
  Filled 2023-01-30: qty 16, 30d supply, fill #1
  Filled 2023-02-26: qty 16, 30d supply, fill #2
  Filled 2023-03-24: qty 16, 30d supply, fill #3
  Filled 2023-04-07 – 2023-04-15 (×2): qty 16, 30d supply, fill #4
  Filled 2023-05-23: qty 16, 30d supply, fill #5

## 2022-10-21 MED ORDER — TIOTROPIUM BROMIDE MONOHYDRATE 18 MCG IN CAPS
1.0000 | ORAL_CAPSULE | Freq: Every day | RESPIRATORY_TRACT | 5 refills | Status: DC
  Filled 2022-10-21 – 2022-12-20 (×2): qty 30, 30d supply, fill #0
  Filled 2023-01-22: qty 30, 30d supply, fill #1
  Filled 2023-03-11 (×2): qty 30, 30d supply, fill #2
  Filled 2023-04-07: qty 30, 30d supply, fill #3
  Filled 2023-05-20 – 2023-05-21 (×2): qty 30, 30d supply, fill #4
  Filled 2023-06-25 – 2023-06-26 (×3): qty 30, 30d supply, fill #5

## 2022-10-21 MED ORDER — OMEPRAZOLE 40 MG PO CPDR
40.0000 mg | DELAYED_RELEASE_CAPSULE | Freq: Two times a day (BID) | ORAL | 1 refills | Status: DC | PRN
  Filled 2022-10-21: qty 180, 90d supply, fill #0
  Filled 2022-11-19: qty 60, 30d supply, fill #0
  Filled 2022-12-20: qty 60, 30d supply, fill #1
  Filled 2023-01-17: qty 60, 30d supply, fill #2
  Filled 2023-02-26: qty 60, 30d supply, fill #3
  Filled 2023-04-07: qty 60, 30d supply, fill #4
  Filled 2023-04-25 – 2023-05-08 (×2): qty 60, 30d supply, fill #5
  Filled ????-??-??: fill #5

## 2022-10-24 ENCOUNTER — Telehealth: Payer: Self-pay

## 2022-10-24 ENCOUNTER — Other Ambulatory Visit: Payer: Self-pay

## 2022-10-24 ENCOUNTER — Other Ambulatory Visit: Payer: Self-pay | Admitting: Psychiatry

## 2022-10-24 MED ORDER — QUETIAPINE FUMARATE 400 MG PO TABS
400.0000 mg | ORAL_TABLET | Freq: Every day | ORAL | 0 refills | Status: DC
  Filled 2022-10-24: qty 30, 30d supply, fill #0

## 2022-10-24 MED ORDER — SERTRALINE HCL 100 MG PO TABS
100.0000 mg | ORAL_TABLET | Freq: Every day | ORAL | 2 refills | Status: DC
  Filled 2022-10-24: qty 30, 30d supply, fill #0
  Filled 2022-11-18: qty 30, 30d supply, fill #1
  Filled 2022-12-17: qty 30, 30d supply, fill #2

## 2022-10-24 NOTE — Telephone Encounter (Signed)
Ordered quetiapine, sertraline.

## 2022-10-24 NOTE — Telephone Encounter (Signed)
pt called left message that she needs her prescriptions that you give her sent to the [facility]

## 2022-10-24 NOTE — Telephone Encounter (Signed)
i called previous pharmacy [facility] and canceled the rxs.  pt was last seen on 7-11 next appt 8-27

## 2022-10-25 ENCOUNTER — Other Ambulatory Visit: Payer: Self-pay

## 2022-10-25 NOTE — Telephone Encounter (Signed)
Pt.notified

## 2022-10-28 ENCOUNTER — Other Ambulatory Visit: Payer: Self-pay

## 2022-10-28 MED ORDER — PREGABALIN 25 MG PO CAPS
25.0000 mg | ORAL_CAPSULE | Freq: Three times a day (TID) | ORAL | 1 refills | Status: DC
  Filled 2022-10-28: qty 90, 30d supply, fill #0
  Filled 2022-12-06: qty 90, 30d supply, fill #1

## 2022-10-31 ENCOUNTER — Other Ambulatory Visit: Payer: Self-pay

## 2022-10-31 MED ORDER — AMLODIPINE BESYLATE 10 MG PO TABS
10.0000 mg | ORAL_TABLET | Freq: Every day | ORAL | 1 refills | Status: AC
  Filled 2022-10-31: qty 90, 90d supply, fill #0
  Filled 2023-01-30: qty 90, 90d supply, fill #1

## 2022-11-01 ENCOUNTER — Other Ambulatory Visit: Payer: Self-pay

## 2022-11-01 MED ORDER — TERBINAFINE HCL 250 MG PO TABS
250.0000 mg | ORAL_TABLET | Freq: Every day | ORAL | 0 refills | Status: AC
  Filled 2022-11-01: qty 90, 90d supply, fill #0
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
## 2022-11-05 ENCOUNTER — Other Ambulatory Visit: Payer: Self-pay

## 2022-11-05 ENCOUNTER — Ambulatory Visit (INDEPENDENT_AMBULATORY_CARE_PROVIDER_SITE_OTHER): Payer: No Typology Code available for payment source | Admitting: Psychiatry

## 2022-11-05 ENCOUNTER — Encounter: Payer: Self-pay | Admitting: Psychiatry

## 2022-11-05 VITALS — BP 148/89 | HR 85 | Temp 99.0°F | Ht <= 58 in | Wt 162.2 lb

## 2022-11-05 DIAGNOSIS — G4700 Insomnia, unspecified: Secondary | ICD-10-CM

## 2022-11-05 DIAGNOSIS — R45851 Suicidal ideations: Secondary | ICD-10-CM

## 2022-11-05 DIAGNOSIS — F250 Schizoaffective disorder, bipolar type: Secondary | ICD-10-CM | POA: Diagnosis not present

## 2022-11-05 DIAGNOSIS — F1290 Cannabis use, unspecified, uncomplicated: Secondary | ICD-10-CM

## 2022-11-05 DIAGNOSIS — F4310 Post-traumatic stress disorder, unspecified: Secondary | ICD-10-CM

## 2022-11-05 MED ORDER — HYDRALAZINE HCL 25 MG PO TABS
25.0000 mg | ORAL_TABLET | Freq: Three times a day (TID) | ORAL | 11 refills | Status: DC
  Filled 2022-11-05: qty 90, 30d supply, fill #0
  Filled 2022-12-16 – 2022-12-17 (×2): qty 90, 30d supply, fill #1
  Filled 2023-01-17: qty 90, 30d supply, fill #2
  Filled 2023-02-11: qty 90, 30d supply, fill #3
  Filled 2023-03-11: qty 90, 30d supply, fill #4
  Filled 2023-04-07: qty 90, 30d supply, fill #5
  Filled 2023-04-25 – 2023-05-08 (×4): qty 90, 30d supply, fill #6
  Filled 2023-05-15 – 2023-06-02 (×2): qty 90, 30d supply, fill #7
  Filled 2023-06-25 – 2023-07-01 (×3): qty 90, 30d supply, fill #8
  Filled 2023-07-21 – 2023-08-15 (×5): qty 90, 30d supply, fill #9
  Filled 2023-09-08: qty 90, 30d supply, fill #10
  Filled 2023-10-07: qty 90, 30d supply, fill #11

## 2022-11-05 MED ORDER — LURASIDONE HCL 40 MG PO TABS
40.0000 mg | ORAL_TABLET | Freq: Every day | ORAL | 1 refills | Status: DC
  Filled 2022-11-05 – 2022-11-18 (×2): qty 30, 30d supply, fill #0
  Filled 2022-12-16 – 2022-12-17 (×2): qty 30, 30d supply, fill #1

## 2022-11-05 MED ORDER — PROAIR RESPICLICK 108 (90 BASE) MCG/ACT IN AEPB
2.0000 | INHALATION_SPRAY | Freq: Four times a day (QID) | RESPIRATORY_TRACT | 1 refills | Status: DC
  Filled 2022-11-05 – 2023-08-12 (×2): qty 1, 25d supply, fill #0

## 2022-11-05 MED ORDER — LURASIDONE HCL 20 MG PO TABS
20.0000 mg | ORAL_TABLET | Freq: Every day | ORAL | 0 refills | Status: DC
  Filled 2022-11-05: qty 14, 14d supply, fill #0

## 2022-11-05 NOTE — Patient Instructions (Signed)
Decrease quetiapine 200 mg at night for two weeks, then discontinue  Start latuda 20 mg daily for two weeks, then 40 mg daily  Continue sertraline 100 mg daily  Continue Trazodone 50 mg at night as needed for insomnia Next appointment: 10/22 at 11 am

## 2022-11-06 ENCOUNTER — Other Ambulatory Visit: Payer: Self-pay

## 2022-11-06 DIAGNOSIS — J410 Simple chronic bronchitis: Secondary | ICD-10-CM

## 2022-11-07 ENCOUNTER — Other Ambulatory Visit: Payer: Self-pay

## 2022-11-07 MED ORDER — ALBUTEROL SULFATE HFA 108 (90 BASE) MCG/ACT IN AERS
2.0000 | INHALATION_SPRAY | RESPIRATORY_TRACT | 1 refills | Status: DC | PRN
  Filled 2022-11-07: qty 18, 17d supply, fill #0
  Filled 2022-12-16 – 2022-12-17 (×2): qty 18, 17d supply, fill #1

## 2022-11-08 ENCOUNTER — Ambulatory Visit: Payer: Medicaid Other | Admitting: Licensed Practical Nurse

## 2022-11-18 ENCOUNTER — Other Ambulatory Visit: Payer: Self-pay

## 2022-11-18 ENCOUNTER — Other Ambulatory Visit: Payer: Self-pay | Admitting: Psychiatry

## 2022-11-19 ENCOUNTER — Other Ambulatory Visit: Payer: Self-pay

## 2022-11-19 ENCOUNTER — Ambulatory Visit
Admission: RE | Admit: 2022-11-19 | Discharge: 2022-11-19 | Disposition: A | Discharge status: Home / Self Care | Payer: Medicaid Other | Source: Ambulatory Visit | Attending: Nephrology | Admitting: Nephrology

## 2022-11-19 MED ORDER — NORTRIPTYLINE HCL 25 MG PO CAPS
ORAL_CAPSULE | ORAL | 1 refills | Status: DC
  Filled 2022-11-19 (×2): qty 270, 90d supply, fill #0
  Filled 2023-01-17 – 2023-02-26 (×2): qty 270, 90d supply, fill #1

## 2022-11-19 MED ORDER — SUCRALFATE 1 G PO TABS
1.0000 g | ORAL_TABLET | Freq: Two times a day (BID) | ORAL | 2 refills | Status: DC
  Filled 2022-11-19 (×2): qty 180, 90d supply, fill #0
  Filled 2023-02-26 (×2): qty 90, 45d supply, fill #1
  Filled 2023-05-08 – 2023-05-15 (×4): qty 180, 90d supply, fill #2
  Filled 2023-06-02: qty 60, 30d supply, fill #2
  Filled 2023-06-25 – 2023-07-01 (×3): qty 60, 30d supply, fill #3
  Filled 2023-07-21 – 2023-08-15 (×5): qty 60, 30d supply, fill #4

## 2022-11-19 MED ORDER — LORATADINE 10 MG PO TABS
10.0000 mg | ORAL_TABLET | Freq: Every day | ORAL | 11 refills | Status: DC
  Filled 2022-11-19 (×2): qty 30, 30d supply, fill #0
  Filled 2022-12-16 – 2022-12-17 (×2): qty 30, 30d supply, fill #1
  Filled 2023-01-16: qty 30, 30d supply, fill #2
  Filled 2023-01-30 – 2023-02-11 (×3): qty 30, 30d supply, fill #3
  Filled 2023-03-24: qty 30, 30d supply, fill #4
  Filled 2023-04-11: qty 30, 30d supply, fill #5
  Filled 2023-04-25 – 2023-05-09 (×4): qty 30, 30d supply, fill #6
  Filled 2023-05-15 – 2023-06-02 (×2): qty 30, 30d supply, fill #7
  Filled 2023-06-25 – 2023-07-01 (×3): qty 30, 30d supply, fill #8
  Filled 2023-07-21 – 2023-08-15 (×5): qty 30, 30d supply, fill #9
  Filled 2023-09-08: qty 30, 30d supply, fill #10
  Filled 2023-10-07: qty 30, 30d supply, fill #11

## 2022-11-20 ENCOUNTER — Other Ambulatory Visit: Payer: Self-pay

## 2022-11-21 ENCOUNTER — Other Ambulatory Visit: Payer: Self-pay

## 2022-11-22 ENCOUNTER — Other Ambulatory Visit: Payer: Self-pay

## 2022-11-25 ENCOUNTER — Inpatient Hospital Stay: Admission: RE | Admit: 2022-11-25 | Payer: Medicaid Other | Source: Ambulatory Visit

## 2022-11-25 ENCOUNTER — Ambulatory Visit: Payer: Self-pay | Admitting: Urology

## 2022-11-26 ENCOUNTER — Ambulatory Visit
Admission: RE | Admit: 2022-11-26 | Discharge: 2022-11-26 | Disposition: A | Discharge status: Home / Self Care | Payer: Medicaid Other | Source: Ambulatory Visit | Attending: Nephrology | Admitting: Nephrology

## 2022-11-26 DIAGNOSIS — N184 Chronic kidney disease, stage 4 (severe): Secondary | ICD-10-CM | POA: Diagnosis present

## 2022-11-26 DIAGNOSIS — D631 Anemia in chronic kidney disease: Secondary | ICD-10-CM | POA: Insufficient documentation

## 2022-11-26 DIAGNOSIS — I129 Hypertensive chronic kidney disease with stage 1 through stage 4 chronic kidney disease, or unspecified chronic kidney disease: Secondary | ICD-10-CM | POA: Insufficient documentation

## 2022-11-26 MED ORDER — SODIUM CHLORIDE 0.9 % IV SOLN
200.0000 mg | Freq: Once | INTRAVENOUS | Status: AC
  Administered 2022-11-26: 200 mg via INTRAVENOUS
  Filled 2022-11-26: qty 200

## 2022-11-28 ENCOUNTER — Telehealth: Payer: Self-pay | Admitting: Psychiatry

## 2022-11-28 NOTE — Telephone Encounter (Signed)
Reviewed a discharge summary from [facility]. The diagnoses include severe, recurrent major depressive disorder with psychosis and severe marijuana use disorder. Medications prescribed are sertraline 100 mg daily, Rexulti 1 mg daily, quetiapine 200 mg at night, and trazodone 50 mg at night.

## 2022-12-02 ENCOUNTER — Telehealth: Payer: Self-pay

## 2022-12-02 NOTE — Telephone Encounter (Signed)
pt states that since dr. Vanetta Shawl took her office the seroquel for weightgain and put her on something else she is not sleeping she states she is still up at 4:00. pt last seen on 8-27 next appt 10-22.

## 2022-12-03 ENCOUNTER — Encounter: Payer: Self-pay | Admitting: Physician Assistant

## 2022-12-03 ENCOUNTER — Ambulatory Visit (INDEPENDENT_AMBULATORY_CARE_PROVIDER_SITE_OTHER): Payer: Medicaid Other | Admitting: Physician Assistant

## 2022-12-03 VITALS — BP 138/88 | HR 79 | Temp 98.0°F | Ht <= 58 in | Wt 168.0 lb

## 2022-12-03 DIAGNOSIS — K529 Noninfective gastroenteritis and colitis, unspecified: Secondary | ICD-10-CM

## 2022-12-03 DIAGNOSIS — Z09 Encounter for follow-up examination after completed treatment for conditions other than malignant neoplasm: Secondary | ICD-10-CM | POA: Diagnosis not present

## 2022-12-03 DIAGNOSIS — K56699 Other intestinal obstruction unspecified as to partial versus complete obstruction: Secondary | ICD-10-CM

## 2022-12-03 DIAGNOSIS — K633 Ulcer of intestine: Secondary | ICD-10-CM

## 2022-12-03 DIAGNOSIS — K922 Gastrointestinal hemorrhage, unspecified: Secondary | ICD-10-CM | POA: Diagnosis not present

## 2022-12-03 NOTE — Progress Notes (Signed)
[facility] SURGICAL CLINIC NOTE  12/03/2022  History of Present Illness: Darlene Barber is a 57 y.o. female well known to our service following hand-assisted laparoscopic subtotal colectomy for history of diverticular colonic stricture and GI bleeding with Dr Everlene Farrier on 08/03/2022. She had done well in follow up. Today, she reports she is doing well. Her abdominal pain has subsided. She is not running any fevers at home. She has sporadic nausea but states this is markedly better than before surgery as she was having this daily. No emesis. Bowels at baseline fluctuate from constipation to diarrhea. She reports this has been this way "her whole life." She is utilizing Miralax as needed. Tolerating PO. Ambulating without issue. Wounds are all well healed. No other or new complaints this afternoon.   Past Medical History: Past Medical History:  Diagnosis Date   Allergy    Anxiety    Bursitis of both hips    CKD (chronic kidney disease) stage 4, GFR 15-29 ml/min (HCC)    Depression    Frequent headaches    Obesity    Sleep apnea    doesn't use CPAP machine broken,      Past Surgical History: Past Surgical History:  Procedure Laterality Date   BIOPSY  03/30/2022   Procedure: BIOPSY;  Surgeon: Benancio Deeds, MD;  Location: [facility] ENDOSCOPY;  Service: Gastroenterology;;   CESAREAN SECTION     x2   CHOLECYSTECTOMY     COLONOSCOPY WITH PROPOFOL N/A 11/20/2017   Procedure: COLONOSCOPY WITH PROPOFOL;  Surgeon: Toney Reil, MD;  Location: [facility] ENDOSCOPY;  Service: Gastroenterology;  Laterality: N/A;   COLONOSCOPY WITH PROPOFOL N/A 03/30/2022   Procedure: COLONOSCOPY WITH PROPOFOL;  Surgeon: Benancio Deeds, MD;  Location: [facility] ENDOSCOPY;  Service: Gastroenterology;  Laterality: N/A;   COLONOSCOPY WITH PROPOFOL N/A 08/02/2022   Procedure: COLONOSCOPY WITH PROPOFOL;  Surgeon: Toney Reil, MD;  Location: [facility] ENDOSCOPY;  Service: Gastroenterology;  Laterality:  N/A;   ESOPHAGOGASTRODUODENOSCOPY (EGD) WITH PROPOFOL N/A 03/30/2022   Procedure: ESOPHAGOGASTRODUODENOSCOPY (EGD) WITH PROPOFOL;  Surgeon: Benancio Deeds, MD;  Location: [facility] ENDOSCOPY;  Service: Gastroenterology;  Laterality: N/A;   HYSTERECTOMY ABDOMINAL WITH SALPINGECTOMY  1998   HYSTEROSCOPY     LAPAROSCOPIC SUBTOTAL COLECTOMY Right 08/03/2022   Procedure: HAND ASSISTED LAPAROSCOPIC SUBTOTAL COLECTOMY;  Surgeon: Leafy Ro, MD;  Location: [facility] ORS;  Service: General;  Laterality: Right;    Home Medications: Prior to Admission medications   Medication Sig Start Date End Date Taking? Authorizing Provider  metoprolol tartrate (LOPRESSOR) 25 MG tablet Take 25 mg by mouth 2 (two) times daily.   Yes [provider]  acetaminophen (TYLENOL) 500 MG tablet Take 2 tablets (1,000 mg total) by mouth every 6 (six) hours. 08/06/22   Alford Highland, MD  acyclovir (ZOVIRAX) 400 MG tablet Take 400 mg by mouth 2 (two) times daily.    [provider]  acyclovir (ZOVIRAX) 400 MG tablet Take 1 tablet (400 mg total) by mouth every 8 (eight) hours. 05/28/22   Gracelyn Nurse, MD  albuterol (VENTOLIN HFA) 108 (90 Base) MCG/ACT inhaler INHALE 2 PUFFS EVERY FOUR HOURS AS NEEDED Patient taking differently: Inhale 2 puffs into the lungs every 4 (four) hours as needed for wheezing or shortness of breath. 10/08/21   Karamalegos, Netta Neat, DO  albuterol (VENTOLIN HFA) 108 (90 Base) MCG/ACT inhaler Inhale 2 puffs into the lungs every 4 (four) hours as needed. 11/07/22     Albuterol Sulfate (PROAIR RESPICLICK) 108 (  90 Base) MCG/ACT AEPB Inhale 2 puffs into the lungs 4 (four) times daily. 11/05/22     amLODipine (NORVASC) 10 MG tablet Take 1 tablet (10 mg total) by mouth daily. 10/31/22   Gracelyn Nurse, MD  B Complex-C (SUPER B COMPLEX PO) Take 1 tablet by mouth daily with breakfast.    [provider]  BREYNA 160-4.5 MCG/ACT inhaler Inhale 2 puffs into the lungs 2 (two) times daily.     [provider]  Calcium Carbonate Antacid (ANTACID SOFT CHEWS PO) Take 1 tablet by mouth every 6 (six) hours as needed (for indigesation- CHEW).    [provider]  EMGALITY 120 MG/ML SOSY Inject into the skin every 30 (thirty) days. 01/01/22   [provider]  fluticasone (FLONASE) 50 MCG/ACT nasal spray 1 SPARY IN EACH NOSTRIL DAILY Patient taking differently: Place 1 spray into both nostrils daily. 10/08/21   Karamalegos, Netta Neat, DO  fluticasone (FLONASE) 50 MCG/ACT nasal spray Place 2 sprays into both nostrils daily. 10/21/22     Galcanezumab-gnlm (EMGALITY) 120 MG/ML SOAJ Inject 120 mg into the skin every 30 (thirty) days. 08/26/22   Bridgette Habermann, PA-C  hydrALAZINE (APRESOLINE) 25 MG tablet Take 1 tablet (25 mg total) by mouth in the morning and 1 tablet (25 mg total) in the evening and 1 tablet (25 mg total) before bedtime. 11/05/22     loratadine (CLARITIN) 10 MG tablet Take 1 tablet (10 mg total) by mouth once daily 11/19/22     lurasidone (LATUDA) 20 MG TABS tablet Take 1 tablet (20 mg total) by mouth daily for 14 days. 11/05/22 11/20/22  Neysa Hotter, MD  lurasidone (LATUDA) 40 MG TABS tablet Take 1 tablet (40 mg total) by mouth daily with breakfast. Start after completing 20 mg daily for two weeks 11/19/22 01/18/23  Neysa Hotter, MD  nortriptyline (PAMELOR) 25 MG capsule Take 1 capsule (25 mg total) by mouth in the morning AND 2 capsules (50 mg total) at bedtime. 11/19/22     omeprazole (PRILOSEC) 40 MG capsule Take 1 capsule (40 mg total) by mouth 2 (two) times daily as needed. 10/21/22     ondansetron (ZOFRAN-ODT) 4 MG disintegrating tablet Take 4 mg by mouth every 8 (eight) hours as needed for nausea.    [provider]  pregabalin (LYRICA) 25 MG capsule Take 1 capsule (25 mg total) by mouth 3 (three) times daily. 10/28/22     Probiotic Product (PROBIOTIC PO) Take 1 capsule by mouth daily.    [provider]  QUEtiapine (SEROQUEL) 400 MG tablet  Take 1 tablet (400 mg total) by mouth at bedtime. 10/24/22 11/26/22  Neysa Hotter, MD  Saccharomyces boulardii (PROBIOTIC) 250 MG CAPS Take 1 capsule by mouth in the morning and at bedtime. 08/13/22   Willy Eddy, MD  sertraline (ZOLOFT) 100 MG tablet Take 1 tablet (100 mg total) by mouth at bedtime. 10/24/22 01/22/23  Neysa Hotter, MD  sodium bicarbonate 650 MG tablet Take 1 tablet (650 mg total) by mouth 3 (three) times daily. 08/06/22   Alford Highland, MD  sucralfate (CARAFATE) 1 g tablet Take 1 tablet (1 g total) by mouth 2 (two) times daily before meals. 11/19/22     terbinafine (LAMISIL) 250 MG tablet Take 1 tablet (250 mg total) by mouth once daily for 90 days. 11/01/22 02/04/23  Linus Galas, DPM  tiotropium (SPIRIVA) 18 MCG inhalation capsule Place 1 capsule (18 mcg total) into inhaler and inhale daily. 10/21/22  traZODone (DESYREL) 50 MG tablet Take 1 tablet (50 mg total) by mouth at bedtime as needed for sleep. Patient taking differently: Take 50 mg by mouth at bedtime. 06/01/22 06/01/23  Kathlen Mody, MD    Allergies: Allergies  Allergen Reactions   Other Anaphylaxis, Itching, Swelling and Other (See Comments)    Black pepper   Black Pepper-Turmeric    Latex Rash   Silicone Rash   Tape Rash and Other (See Comments)    Paper tape is ok to use.    Review of Systems: Review of Systems  Constitutional:  Negative for chills and fever.  Respiratory:  Negative for cough and shortness of breath.   Cardiovascular:  Negative for chest pain and palpitations.  Gastrointestinal:  Positive for constipation (Baseline), diarrhea (Baseline) and nausea (Improving). Negative for abdominal pain and vomiting.  Genitourinary:  Negative for dysuria and urgency.  All other systems reviewed and are negative.   Physical Exam Ht 4\' 10"  (1.473 m)   Wt 168 lb (76.2 kg)   BMI 35.11 kg/m   Physical Exam Vitals and nursing note reviewed. Exam conducted with a chaperone present.   Constitutional:      General: She is not in acute distress.    Appearance: Normal appearance. She is not ill-appearing.     Comments: Sitting up on examination table; NAD.  Family at bedside   HENT:     Head: Normocephalic and atraumatic.  Eyes:     Extraocular Movements: Extraocular movements intact.     Conjunctiva/sclera: Conjunctivae normal.     Comments: Wearing glasses   Cardiovascular:     Rate and Rhythm: Normal rate.     Pulses: Normal pulses.  Pulmonary:     Effort: Pulmonary effort is normal. No respiratory distress.  Abdominal:     General: A surgical scar is present. There is no distension.     Palpations: Abdomen is soft.     Tenderness: There is no abdominal tenderness. There is no guarding or rebound.     Comments: Abdomen is soft, non-tender, non-distended, no rebound/guarding. All laparoscopic incisions are well healed.   Genitourinary:    Comments: Deferred Musculoskeletal:     Right lower leg: No edema.     Left lower leg: No edema.  Skin:    General: Skin is warm and dry.     Findings: No erythema.  Neurological:     General: No focal deficit present.     Mental Status: She is alert and oriented to person, place, and time.  Psychiatric:        Mood and Affect: Mood normal.        Behavior: Behavior normal.     Labs/Imaging: No new pertinent labs or imaging studies     Assessment and Plan: This is a 57 y.o. female s/p hand-assisted laparoscopic subtotal colectomy for history of diverticular colonic stricture and GI bleeding with Dr Everlene Farrier on 08/03/2022   - She is doing well; Nothing further from surgical standpoint  - Okay to continue bowel regimen as she is doing  - She has completed all restrictions  - She can follow up on as needed basis; She understands to call in the future with any questions or concerns  Face-to-face time spent with the patient and care providers was 20 minutes, with more than 50% of the time spent counseling, educating,  and coordinating care of the patient.     Lynden Oxford, PA-C [facility] 12/03/2022, 1:14 PM M-F: 7am -  4pm

## 2022-12-03 NOTE — Telephone Encounter (Signed)
Spoke with pt after reviewing records. Clarified her current medications and recommended to increase trazodone to 100 mg daily at bedtime for sleep from 50 mg daily at bedtime.

## 2022-12-06 ENCOUNTER — Other Ambulatory Visit: Payer: Self-pay

## 2022-12-06 MED ORDER — TRAZODONE HCL 100 MG PO TABS
100.0000 mg | ORAL_TABLET | Freq: Every day | ORAL | 0 refills | Status: DC
  Filled 2022-12-06: qty 90, 90d supply, fill #0

## 2022-12-06 MED ORDER — METOPROLOL TARTRATE 25 MG PO TABS
25.0000 mg | ORAL_TABLET | Freq: Two times a day (BID) | ORAL | 0 refills | Status: AC
  Filled 2022-12-06: qty 180, 90d supply, fill #0

## 2022-12-10 ENCOUNTER — Ambulatory Visit: Payer: Medicaid Other | Admitting: Licensed Practical Nurse

## 2022-12-16 ENCOUNTER — Encounter: Payer: Self-pay | Admitting: Urology

## 2022-12-16 ENCOUNTER — Other Ambulatory Visit: Payer: Self-pay

## 2022-12-16 ENCOUNTER — Ambulatory Visit (INDEPENDENT_AMBULATORY_CARE_PROVIDER_SITE_OTHER): Payer: Medicaid Other | Admitting: Urology

## 2022-12-16 VITALS — Ht <= 58 in | Wt 166.0 lb

## 2022-12-16 DIAGNOSIS — N3946 Mixed incontinence: Secondary | ICD-10-CM

## 2022-12-16 DIAGNOSIS — N3281 Overactive bladder: Secondary | ICD-10-CM

## 2022-12-16 NOTE — Progress Notes (Signed)
12/16/2022 11:00 AM   Darlene Barber [birth year] [id number]  Referring provider: Gracelyn Nurse, MD [address] [place],  [place] [address]  Chief Complaint  Patient presents with   Follow-up    HPI: She has urge incontinence that is refractory to treatment with very little warning.  They can be high-volume.  She has some stress incontinence with bronchitis.  She does very well with Botox.  Frequency stable.  Incontinence stable.  Clinically not infected     Today Last Botox was Barber 7, 2022.  She was doing well postoperatively and was emptying well Patient started to leak a little bit even with coughing sneezing and the Botox usually helps this.  Clinically not infected.  Frequency stable     Today Last Botox treatment was June 11, 2021.  She had blood in the urine after the Botox that resolved.  Residual 2 weeks later was 99 mL.  Recent urine culture negative   I reviewed the chart and the patient has primarily refractory overactive bladder and mild bedwetting and mild stress incontinence.  When do we did urodynamics in 2019 she did not leak with a Valsalva pressure of 122 cm water.  She had bladder overactivity.  She has failed Myrbetriq and a number of antimuscarinics.  She leaks a small amount with coughing but more so she has a bad cough with her bronchitis and was trying to stop smoking.  In 2019 we talked about InterStim versus Botox.  When she gets urge incontinence it is very sudden.  When I saw her in April 2021 she was not wearing pads with no bedwetting and rare urge incontinence.  Still would leak with bronchitis.  When I saw her in January 2022 she was not wearing a pad but could have some high-volume episodes   Currently wears 2 pads a day primarily with urgency.  She can leak when she goes from a sitting to standing position.  With a light cough she does not leak.  If she still keeps coughing she will leak.   On pelvic examination she had a high small  cystocele.  She had grade 1 hypermobility the bladder neck with a little bit of descensus at rest.  She had no stress incontinence after cystoscopy and Botox with a modest cough.   Cystoscopy and Botox: Patient underwent flexible cystoscopy and Botox.  Bladder mucosa and trigone were normal.  I injected with my modified template primarily in the midline but also the floor the bladder.  10 injections 1 cc each.  She tolerated very well this time.  Patient understands that she has mild mixed incontinence and that a bladder sling may not help her urge incontinence with sudden warning and at times bedwetting.  We will have the nurse practitioner see her as per protocol but like to see her in about 2 months and review baseline her symptoms.  She actually usually does quite well with Botox.  She may need repeat urodynamics in the future.  I think we need to have reasonable treatment goals.  I think the patient at times mixes up her incontinence or gets frustrated because Botox needs to be repeated.  I also can talk about InterStim next visit as well but I want to review baseline her symptoms post Botox  Today Patient underwent Botox October 2023.    Describes sepsis in the intensive care unit around the time of the new year and recent GI surgery.  She describes chronic renal sufficiency.  I reviewed the medical records she had a laparoscopic subtotal colectomy for colonic stricture and GI bleeding.  It appears she had a diverticular abscess in June 2024 ollowing the hemicolectomy.  She has been in the emergency room many times in the last several months.  She was admitted in February 2024 with septic shock respiratory failure requiring intubation and pressors.  She had positive blood cultures.  Serum creatinine was 2.7 with a glomerular filtration rate of 20 mL/min on September 19, 2022 Currently has urge incontinence wearing 2 pads a day leaking of varying amount.  History was a bit challenging.  She has mild to  moderate bedwetting but daytime symptoms are worse.  She has fecal incontinence but not every day.   PMH: Past Medical History:  Diagnosis Date   Allergy    Anxiety    Bursitis of both hips    CKD (chronic kidney disease) stage 4, GFR 15-29 ml/min (HCC)    Depression    Frequent headaches    Obesity    Sleep apnea    doesn't use CPAP machine broken,     Surgical History: Past Surgical History:  Procedure Laterality Date   BIOPSY  03/30/2022   Procedure: BIOPSY;  Surgeon: Benancio Deeds, MD;  Location: [facility] ENDOSCOPY;  Service: Gastroenterology;;   CESAREAN SECTION     x2   CHOLECYSTECTOMY     COLONOSCOPY WITH PROPOFOL N/A 11/20/2017   Procedure: COLONOSCOPY WITH PROPOFOL;  Surgeon: Toney Reil, MD;  Location: [facility] ENDOSCOPY;  Service: Gastroenterology;  Laterality: N/A;   COLONOSCOPY WITH PROPOFOL N/A 03/30/2022   Procedure: COLONOSCOPY WITH PROPOFOL;  Surgeon: Benancio Deeds, MD;  Location: [facility] ENDOSCOPY;  Service: Gastroenterology;  Laterality: N/A;   COLONOSCOPY WITH PROPOFOL N/A 08/02/2022   Procedure: COLONOSCOPY WITH PROPOFOL;  Surgeon: Toney Reil, MD;  Location: [facility] ENDOSCOPY;  Service: Gastroenterology;  Laterality: N/A;   ESOPHAGOGASTRODUODENOSCOPY (EGD) WITH PROPOFOL N/A 03/30/2022   Procedure: ESOPHAGOGASTRODUODENOSCOPY (EGD) WITH PROPOFOL;  Surgeon: Benancio Deeds, MD;  Location: [facility] ENDOSCOPY;  Service: Gastroenterology;  Laterality: N/A;   HYSTERECTOMY ABDOMINAL WITH SALPINGECTOMY  1998   HYSTEROSCOPY     LAPAROSCOPIC SUBTOTAL COLECTOMY Right 08/03/2022   Procedure: HAND ASSISTED LAPAROSCOPIC SUBTOTAL COLECTOMY;  Surgeon: Leafy Ro, MD;  Location: [facility] ORS;  Service: General;  Laterality: Right;    Home Medications:  Allergies as of 12/16/2022       Reactions   Other Anaphylaxis, Itching, Swelling, Other (See Comments)   Black pepper   Black Pepper-turmeric    Latex Rash   Silicone Rash   Tape Rash, Other (See Comments)    Paper tape is ok to use.        Medication List        Accurate as of December 16, 2022 11:00 AM. If you have any questions, ask your nurse or doctor.          acetaminophen 500 MG tablet Commonly known as: TYLENOL Take 2 tablets (1,000 mg total) by mouth every 6 (six) hours.   acyclovir 400 MG tablet Commonly known as: ZOVIRAX Take 1 tablet (400 mg total) by mouth every 8 (eight) hours.   amLODipine 10 MG tablet Commonly known as: NORVASC Take 1 tablet (10 mg total) by mouth daily.   ANTACID SOFT CHEWS PO Take 1 tablet by mouth every 6 (six) hours as needed (for indigesation- CHEW).   Breyna 160-4.5 MCG/ACT inhaler Generic drug: budesonide-formoterol Inhale 2 puffs into the lungs 2 (two) times  daily.   Emgality 120 MG/ML Sosy Generic drug: Galcanezumab-gnlm Inject into the skin every 30 (thirty) days.   Emgality 120 MG/ML Soaj Generic drug: Galcanezumab-gnlm Inject 120 mg into the skin every 30 (thirty) days.   fluticasone 50 MCG/ACT nasal spray Commonly known as: FLONASE 1 SPARY IN EACH NOSTRIL DAILY What changed:  how much to take how to take this when to take this additional instructions   fluticasone 50 MCG/ACT nasal spray Commonly known as: FLONASE Place 2 sprays into both nostrils daily. What changed: Another medication with the same name was changed. Make sure you understand how and when to take each.   hydrALAZINE 25 MG tablet Commonly known as: APRESOLINE Take 1 tablet (25 mg total) by mouth in the morning and 1 tablet (25 mg total) in the evening and 1 tablet (25 mg total) before bedtime.   loratadine 10 MG tablet Commonly known as: CLARITIN Take 1 tablet (10 mg total) by mouth once daily   lurasidone 20 MG Tabs tablet Commonly known as: LATUDA Take 1 tablet (20 mg total) by mouth daily for 14 days.   lurasidone 40 MG Tabs tablet Commonly known as: LATUDA Take 1 tablet (40 mg total) by mouth daily with breakfast. Start after completing 20  mg daily for two weeks   metoprolol tartrate 25 MG tablet Commonly known as: LOPRESSOR Take 25 mg by mouth 2 (two) times daily.   metoprolol tartrate 25 MG tablet Commonly known as: LOPRESSOR Take 1 tablet (25 mg total) by mouth 2 (two) times daily   nortriptyline 25 MG capsule Commonly known as: PAMELOR Take 1 capsule (25 mg total) by mouth in the morning AND 2 capsules (50 mg total) at bedtime.   omeprazole 40 MG capsule Commonly known as: PRILOSEC Take 1 capsule (40 mg total) by mouth 2 (two) times daily as needed.   ondansetron 4 MG disintegrating tablet Commonly known as: ZOFRAN-ODT Take 4 mg by mouth every 8 (eight) hours as needed for nausea.   pregabalin 25 MG capsule Commonly known as: LYRICA Take 1 capsule (25 mg total) by mouth 3 (three) times daily.   ProAir RespiClick 108 (90 Base) MCG/ACT Aepb Generic drug: Albuterol Sulfate Inhale 2 puffs into the lungs 4 (four) times daily.   Ventolin HFA 108 (90 Base) MCG/ACT inhaler Generic drug: albuterol Inhale 2 puffs into the lungs every 4 (four) hours as needed.   Probiotic 250 MG Caps Take 1 capsule by mouth in the morning and at bedtime.   PROBIOTIC PO Take 1 capsule by mouth daily.   QUEtiapine 400 MG tablet Commonly known as: SEROQUEL Take 1 tablet (400 mg total) by mouth at bedtime.   sertraline 100 MG tablet Commonly known as: ZOLOFT Take 1 tablet (100 mg total) by mouth at bedtime.   sodium bicarbonate 650 MG tablet Take 1 tablet (650 mg total) by mouth 3 (three) times daily.   sucralfate 1 g tablet Commonly known as: CARAFATE Take 1 tablet (1 g total) by mouth 2 (two) times daily before meals.   SUPER B COMPLEX PO Take 1 tablet by mouth daily with breakfast.   terbinafine 250 MG tablet Commonly known as: LAMISIL Take 1 tablet (250 mg total) by mouth once daily for 90 days.   tiotropium 18 MCG inhalation capsule Commonly known as: SPIRIVA Place 1 capsule (18 mcg total) into inhaler and  inhale daily.   traZODone 50 MG tablet Commonly known as: DESYREL Take 1 tablet (50 mg total) by mouth at  bedtime as needed for sleep. What changed: when to take this   traZODone 100 MG tablet Commonly known as: DESYREL Take 1 tablet (100 mg total) by mouth at bedtime What changed: Another medication with the same name was changed. Make sure you understand how and when to take each.        Allergies:  Allergies  Allergen Reactions   Other Anaphylaxis, Itching, Swelling and Other (See Comments)    Black pepper   Black Pepper-Turmeric    Latex Rash   Silicone Rash   Tape Rash and Other (See Comments)    Paper tape is ok to use.    Family History: Family History  Problem Relation Age of Onset   Hodgkin's lymphoma Mother    Diabetes Maternal Aunt    Diabetes Maternal Uncle    Alzheimer's disease Maternal Grandfather    Schizophrenia Maternal Grandmother    Heart failure Maternal Grandmother    Emphysema Paternal Grandmother    Cancer Other    Breast cancer Neg Hx     Social History:  reports that she has quit smoking. Her smoking use included cigarettes and cigars. She has a 30 pack-year smoking history. She has been exposed to tobacco smoke. She has never used smokeless tobacco. She reports that she does not currently use alcohol. She reports current drug use. Drug: Marijuana.  ROS:                                        Physical Exam: Ht 4\' 10"  (1.473 m)   Wt 75.3 kg   BMI 34.69 kg/m   Constitutional:  Alert and oriented, No acute distress. HEENT: [place] AT, moist mucus membranes.  Trachea midline, no masses.   Laboratory Data: Lab Results  Component Value Date   WBC 10.3 08/13/2022   HGB 9.4 (L) 08/13/2022   HCT 29.5 (L) 08/13/2022   MCV 93.1 08/13/2022   PLT 676 (H) 08/13/2022    Lab Results  Component Value Date   CREATININE 2.19 (H) 08/13/2022    No results found for: "PSA"  No results found for: "TESTOSTERONE"  Lab  Results  Component Value Date   HGBA1C 5.6 03/10/2022    Urinalysis    Component Value Date/Time   COLORURINE STRAW (A) 07/30/2022 1516   APPEARANCEUR CLEAR (A) 07/30/2022 1516   APPEARANCEUR Clear 12/25/2021 1409   LABSPEC 1.009 07/30/2022 1516   LABSPEC 1.010 04/20/2012 1725   PHURINE 5.0 07/30/2022 1516   GLUCOSEU NEGATIVE 07/30/2022 1516   GLUCOSEU Negative 04/20/2012 1725   HGBUR NEGATIVE 07/30/2022 1516   BILIRUBINUR Negative 10/08/2022 1412   BILIRUBINUR Negative 12/25/2021 1409   BILIRUBINUR Negative 04/20/2012 1725   KETONESUR NEGATIVE 07/30/2022 1516   PROTEINUR Negative 10/08/2022 1412   PROTEINUR NEGATIVE 07/30/2022 1516   UROBILINOGEN 0.2 10/08/2022 1412   NITRITE Negative 10/08/2022 1412   NITRITE NEGATIVE 07/30/2022 1516   LEUKOCYTESUR Negative 10/08/2022 1412   LEUKOCYTESUR NEGATIVE 07/30/2022 1516   LEUKOCYTESUR Negative 04/20/2012 1725    Pertinent Imaging: Urine reviewed and sent for culture  Assessment & Plan: I think we need to have reasonable treatment goals regarding the patient's mixed incontinence.  She has been in another hospital and emergency room multiple times in the last 6 months.  Role of repeating Botox discussed.  Would like to repeat the Botox as out this is reasonable.  Usual protocol.  I do  not think she should have InterStim under the circumstances and certainly not a bladder suspension.  Overall under the circumstances her incontinence is milder but is affecting her quality life 1. OAB (overactive bladder)  - Urinalysis, Complete   No follow-ups on file.  Martina Sinner, MD  [facility] [address], [address] [place], [place] [address] [phone]

## 2022-12-16 NOTE — Patient Instructions (Signed)
BuaBotox Instructions:  1.Patient will come in for lab appointment 2 weeks before Botox to make sure that you do not have a urinary tract infection (UTI)  2. If infection we will give you antibiotics to prevent UTI. 3. Cipro will be sent to your pharmacy to be taken, the day before the procedure, the day of the procedure and the day after the procedure. 4. Please come 30 minutes early so that we may numb your bladder with a local anesthetic and wait for it to take effect. 5.Please temporarily stop taking antiplatelets (aspirin-like products) at least 3 days before treatment

## 2022-12-17 ENCOUNTER — Other Ambulatory Visit: Payer: Self-pay

## 2022-12-17 ENCOUNTER — Other Ambulatory Visit (HOSPITAL_BASED_OUTPATIENT_CLINIC_OR_DEPARTMENT_OTHER): Payer: Self-pay

## 2022-12-17 LAB — URINALYSIS, COMPLETE
Bilirubin, UA: NEGATIVE
Glucose, UA: NEGATIVE
Ketones, UA: NEGATIVE
Leukocytes,UA: NEGATIVE
Nitrite, UA: NEGATIVE
RBC, UA: NEGATIVE
Specific Gravity, UA: 1.02 (ref 1.005–1.030)
Urobilinogen, Ur: 0.2 mg/dL (ref 0.2–1.0)
pH, UA: 5.5 (ref 5.0–7.5)

## 2022-12-17 LAB — MICROSCOPIC EXAMINATION: Epithelial Cells (non renal): >10 /[HPF] — AB (ref 0–10)

## 2022-12-18 ENCOUNTER — Other Ambulatory Visit: Payer: Self-pay

## 2022-12-19 LAB — CULTURE, URINE COMPREHENSIVE

## 2022-12-20 ENCOUNTER — Other Ambulatory Visit: Payer: Self-pay

## 2022-12-23 ENCOUNTER — Telehealth: Payer: Self-pay | Admitting: *Deleted

## 2022-12-23 ENCOUNTER — Other Ambulatory Visit: Payer: Self-pay

## 2022-12-23 MED ORDER — SODIUM BICARBONATE 650 MG PO TABS
650.0000 mg | ORAL_TABLET | Freq: Three times a day (TID) | ORAL | 3 refills | Status: DC
  Filled 2022-12-23: qty 90, 30d supply, fill #0
  Filled 2023-01-30: qty 90, 30d supply, fill #1
  Filled 2023-03-11: qty 90, 30d supply, fill #2
  Filled 2023-04-07: qty 90, 30d supply, fill #3

## 2022-12-23 MED ORDER — NITROFURANTOIN MACROCRYSTAL 100 MG PO CAPS
100.0000 mg | ORAL_CAPSULE | Freq: Two times a day (BID) | ORAL | 0 refills | Status: AC
Start: 2022-12-23 — End: 2023-01-06
  Filled 2022-12-23: qty 14, 7d supply, fill #0

## 2022-12-23 NOTE — Telephone Encounter (Signed)
-----   Message from Alliance A Macdiarmid sent at 12/23/2022  8:22 AM EDT ----- Macrodantin 100 mg twice a day for 7 days ----- Message ----- From: Interface, [facility] Lab Results In Sent: 12/17/2022   5:37 AM EDT To: Alfredo Martinez, MD

## 2022-12-23 NOTE — Telephone Encounter (Signed)
Left message on machine with detailed results, advised tocall if any questions.  rx sent to pharmacy by e-script

## 2022-12-23 NOTE — Addendum Note (Signed)
Encounter addended by: Rosalita Chessman, RN on: 12/23/2022 9:58 AM  Actions taken: Charge Capture section accepted
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
## 2022-12-27 ENCOUNTER — Other Ambulatory Visit: Payer: Self-pay

## 2022-12-30 ENCOUNTER — Other Ambulatory Visit: Payer: Self-pay

## 2022-12-31 ENCOUNTER — Encounter: Payer: Self-pay | Admitting: Psychiatry

## 2022-12-31 ENCOUNTER — Ambulatory Visit (INDEPENDENT_AMBULATORY_CARE_PROVIDER_SITE_OTHER): Payer: No Typology Code available for payment source | Admitting: Psychiatry

## 2022-12-31 ENCOUNTER — Other Ambulatory Visit: Payer: Self-pay

## 2022-12-31 VITALS — BP 135/81 | HR 86 | Temp 97.9°F | Ht <= 58 in | Wt 181.0 lb

## 2022-12-31 DIAGNOSIS — F1290 Cannabis use, unspecified, uncomplicated: Secondary | ICD-10-CM

## 2022-12-31 DIAGNOSIS — F4310 Post-traumatic stress disorder, unspecified: Secondary | ICD-10-CM

## 2022-12-31 DIAGNOSIS — F250 Schizoaffective disorder, bipolar type: Secondary | ICD-10-CM

## 2022-12-31 DIAGNOSIS — R45851 Suicidal ideations: Secondary | ICD-10-CM

## 2022-12-31 DIAGNOSIS — G4700 Insomnia, unspecified: Secondary | ICD-10-CM

## 2022-12-31 MED ORDER — LURASIDONE HCL 60 MG PO TABS
60.0000 mg | ORAL_TABLET | Freq: Every day | ORAL | 2 refills | Status: DC
  Filled 2022-12-31: qty 30, 30d supply, fill #0
  Filled 2023-01-16: qty 30, 30d supply, fill #1

## 2022-12-31 MED ORDER — SERTRALINE HCL 100 MG PO TABS
100.0000 mg | ORAL_TABLET | Freq: Every day | ORAL | 0 refills | Status: DC
  Filled 2023-01-16: qty 90, 90d supply, fill #0

## 2022-12-31 NOTE — Patient Instructions (Signed)
Increase latuda 60 mg daily  Continue sertraline 100 mg daily  Continue Trazodone 100 mg at night as needed for insomnia Next appointment: 12/30 at 11 am

## 2023-01-08 ENCOUNTER — Other Ambulatory Visit: Payer: Self-pay

## 2023-01-09 ENCOUNTER — Other Ambulatory Visit: Payer: Self-pay

## 2023-01-09 MED ORDER — METHYLPREDNISOLONE 4 MG PO TABS
ORAL_TABLET | ORAL | 0 refills | Status: DC
  Filled 2023-01-09: qty 21, 6d supply, fill #0

## 2023-01-09 MED ORDER — PREGABALIN 25 MG PO CAPS
ORAL_CAPSULE | ORAL | 1 refills | Status: DC
  Filled 2023-01-09: qty 90, 30d supply, fill #0
  Filled 2023-02-12: qty 90, 30d supply, fill #1

## 2023-01-13 ENCOUNTER — Other Ambulatory Visit: Payer: Self-pay

## 2023-01-16 ENCOUNTER — Telehealth: Payer: Self-pay

## 2023-01-16 ENCOUNTER — Other Ambulatory Visit (HOSPITAL_COMMUNITY): Payer: Self-pay | Admitting: Psychiatry

## 2023-01-16 ENCOUNTER — Other Ambulatory Visit: Payer: Self-pay

## 2023-01-16 MED ORDER — LURASIDONE HCL 60 MG PO TABS
60.0000 mg | ORAL_TABLET | Freq: Every day | ORAL | 2 refills | Status: DC
  Filled 2023-01-16 – 2023-01-22 (×4): qty 30, 30d supply, fill #0
  Filled 2023-02-26: qty 30, 30d supply, fill #1
  Filled 2023-03-24: qty 30, 30d supply, fill #2

## 2023-01-16 NOTE — Telephone Encounter (Signed)
sent 

## 2023-01-16 NOTE — Telephone Encounter (Signed)
, °

## 2023-01-16 NOTE — Telephone Encounter (Signed)
I spoke to megan at the pharmacy and they're going to hold off on filling the latuda because Darlene Barber stated that patient filled the latuda on 01/09/23 for 30 tablets and she is out and stated that Dr Vanetta Shawl increased the dose to 2 tablets a day but patient threw the bottle away she was previously on 40 mg and went to 60 mg and megan is not sure if she is to take one tablet or two

## 2023-01-16 NOTE — Telephone Encounter (Signed)
Patient called requesting a refill for the following medication please advise   Lurasidone HCl 60 MG TABS   Last visit 12/31/22 Next visit 03/10/23   Preferred pharmacy  [facility] Phone: [phone]  Fax: [phone]

## 2023-01-16 NOTE — Telephone Encounter (Signed)
According to megan she is telling her that she has been taking 2 tablets and it was filled on 10/31 for 30 tablets

## 2023-01-17 ENCOUNTER — Other Ambulatory Visit: Payer: Self-pay

## 2023-01-17 ENCOUNTER — Telehealth: Payer: Self-pay

## 2023-01-17 NOTE — Telephone Encounter (Signed)
Please refer back to Dr. Vanetta Shawl for clarification

## 2023-01-17 NOTE — Telephone Encounter (Signed)
Pt Darlene Barber on triage line inquiring about when she can expect to be scheduled for botox.

## 2023-01-18 ENCOUNTER — Other Ambulatory Visit: Payer: Self-pay

## 2023-01-20 ENCOUNTER — Other Ambulatory Visit: Payer: Self-pay

## 2023-01-20 MED ORDER — ONDANSETRON 4 MG PO TBDP
4.0000 mg | ORAL_TABLET | Freq: Three times a day (TID) | ORAL | 3 refills | Status: DC | PRN
  Filled 2023-01-20: qty 20, 7d supply, fill #0
  Filled 2023-03-11: qty 20, 7d supply, fill #1
  Filled 2023-03-24: qty 20, 7d supply, fill #2
  Filled 2023-05-12: qty 20, 7d supply, fill #3

## 2023-01-20 NOTE — Telephone Encounter (Signed)
Called patient as instructed by provider made patient aware that she is to take Lurasidone HCl 60 MG TABS  one tablet daily she voiced understanding

## 2023-01-20 NOTE — Telephone Encounter (Signed)
Please advise her to lower the dose to 60 mg daily (one tab of 60 mg per day). Let me know if she needs a refill this time.

## 2023-01-21 ENCOUNTER — Other Ambulatory Visit: Payer: Self-pay

## 2023-01-22 ENCOUNTER — Telehealth: Payer: Self-pay

## 2023-01-22 ENCOUNTER — Other Ambulatory Visit: Payer: Self-pay

## 2023-01-22 NOTE — Telephone Encounter (Signed)
pt left a message that the pharmacy would not fill her latuda. she can not get her latuda and that she been out for a week.

## 2023-01-22 NOTE — Telephone Encounter (Signed)
called pharmacy they was able to get a override on the medication refill. but they will have to order and will get in tomorrow afternoon.

## 2023-01-22 NOTE — Telephone Encounter (Signed)
Noted, thanks!

## 2023-01-22 NOTE — Telephone Encounter (Signed)
called pharmacy. she states that pt picked up a rx on 10-31 for latuda 60mg  for a 30 day supply #30,. so patient had called them and told them that she threw the bottle away because the dosages had changed. the pharmaist states that they have had a 40mg  sent in and then they had a 60mg  sent in. The pharmacists states that the last rx that they got was from dr. Tenny Craw and it was for the same thing they gave the patient on the 10-31 which the patient stated that she threw away because doesage had changed.  They need a correct script sent to them.  They also stated that if patient is suppose to be on the 60mg  and the patient tossed it that the insurance may not pay for it because they just filled it on 10-31

## 2023-01-22 NOTE — Telephone Encounter (Signed)
Please contact the pharmacy to fill it today.

## 2023-01-23 ENCOUNTER — Other Ambulatory Visit: Payer: Self-pay

## 2023-01-24 ENCOUNTER — Other Ambulatory Visit: Payer: Self-pay

## 2023-01-24 MED ORDER — ALBUTEROL SULFATE HFA 108 (90 BASE) MCG/ACT IN AERS
2.0000 | INHALATION_SPRAY | RESPIRATORY_TRACT | 1 refills | Status: DC | PRN
  Filled 2023-01-24: qty 18, 17d supply, fill #0
  Filled 2023-03-11 (×3): qty 18, 17d supply, fill #1

## 2023-01-27 ENCOUNTER — Other Ambulatory Visit: Payer: Self-pay

## 2023-01-30 ENCOUNTER — Other Ambulatory Visit: Payer: Self-pay

## 2023-02-03 ENCOUNTER — Encounter (INDEPENDENT_AMBULATORY_CARE_PROVIDER_SITE_OTHER): Payer: Self-pay | Admitting: Vascular Surgery

## 2023-02-03 ENCOUNTER — Ambulatory Visit (INDEPENDENT_AMBULATORY_CARE_PROVIDER_SITE_OTHER): Payer: Medicaid Other | Admitting: Vascular Surgery

## 2023-02-03 VITALS — BP 117/72 | HR 75 | Resp 16 | Wt 177.2 lb

## 2023-02-03 DIAGNOSIS — E118 Type 2 diabetes mellitus with unspecified complications: Secondary | ICD-10-CM | POA: Diagnosis not present

## 2023-02-03 DIAGNOSIS — I10 Essential (primary) hypertension: Secondary | ICD-10-CM

## 2023-02-03 DIAGNOSIS — N185 Chronic kidney disease, stage 5: Secondary | ICD-10-CM

## 2023-02-03 DIAGNOSIS — J432 Centrilobular emphysema: Secondary | ICD-10-CM

## 2023-02-03 DIAGNOSIS — E1169 Type 2 diabetes mellitus with other specified complication: Secondary | ICD-10-CM

## 2023-02-03 DIAGNOSIS — E785 Hyperlipidemia, unspecified: Secondary | ICD-10-CM

## 2023-02-05 ENCOUNTER — Other Ambulatory Visit: Payer: Self-pay

## 2023-02-09 ENCOUNTER — Encounter (INDEPENDENT_AMBULATORY_CARE_PROVIDER_SITE_OTHER): Payer: Self-pay | Admitting: Vascular Surgery

## 2023-02-09 ENCOUNTER — Other Ambulatory Visit: Payer: Self-pay

## 2023-02-09 NOTE — Progress Notes (Signed)
MRN : [id number]  Darlene Barber is a 57 y.o. ([birth year]) female who presents with chief complaint of check access.  History of Present Illness:  The patient is seen for evaluation for dialysis access. The patient has chronic renal insufficiency stage V secondary to hypertension. The patient's most recent creatinine clearance is less than 20. The patient volume status has not yet become an issue. [name] blood pressures been relatively well controlled. There are mild uremic symptoms which appear to be relatively well tolerated at this time.  The patient notes the kidney problem has been present for a long time and has been progressively getting worse.  The patient is followed by nephrology.    The patient is right-handed.  The patient has been considering the various methods of dialysis and wishes to proceed with hemodialysis and therefore creation of AV access is indicated.  No recent shortening of the patient's walking distance or new symptoms consistent with claudication.  No history of rest pain symptoms. No new ulcers or wounds of the lower extremities have occurred.  The patient denies amaurosis fugax or recent TIA symptoms. There are no recent neurological changes noted. There is no history of DVT, PE or superficial thrombophlebitis. No recent episodes of angina or shortness of breath documented.     The previous vein mapping was negative for adequate vein for fistula creation   Current Meds  Medication Sig   acetaminophen (TYLENOL) 500 MG tablet Take 2 tablets (1,000 mg total) by mouth every 6 (six) hours.   acyclovir (ZOVIRAX) 400 MG tablet Take 1 tablet (400 mg total) by mouth every 8 (eight) hours.   albuterol (VENTOLIN HFA) 108 (90 Base) MCG/ACT inhaler Inhale 2 puffs into the lungs every 4 (four) hours as needed.   albuterol (VENTOLIN HFA) 108 (90 Base) MCG/ACT inhaler Inhale 2 puffs into the lungs every 4 (four) hours as needed.   Albuterol  Sulfate (PROAIR RESPICLICK) 108 (90 Base) MCG/ACT AEPB Inhale 2 puffs into the lungs 4 (four) times daily.   amLODipine (NORVASC) 10 MG tablet Take 1 tablet (10 mg total) by mouth daily.   B Complex-C (SUPER B COMPLEX PO) Take 1 tablet by mouth daily with breakfast.   BREYNA 160-4.5 MCG/ACT inhaler Inhale 2 puffs into the lungs 2 (two) times daily.   Calcium Carbonate Antacid (ANTACID SOFT CHEWS PO) Take 1 tablet by mouth every 6 (six) hours as needed (for indigesation- CHEW).   EMGALITY 120 MG/ML SOSY Inject into the skin every 30 (thirty) days.   fluticasone (FLONASE) 50 MCG/ACT nasal spray 1 SPARY IN EACH NOSTRIL DAILY (Patient taking differently: Place 1 spray into both nostrils daily.)   fluticasone (FLONASE) 50 MCG/ACT nasal spray Place 2 sprays into both nostrils daily.   Galcanezumab-gnlm (EMGALITY) 120 MG/ML SOAJ Inject 120 mg into the skin every 30 (thirty) days.   hydrALAZINE (APRESOLINE) 25 MG tablet Take 1 tablet (25 mg total) by mouth in the morning and 1 tablet (25 mg total) in the evening and 1 tablet (25 mg total) before bedtime.   loratadine (CLARITIN) 10 MG tablet Take 1 tablet (10 mg total) by mouth once daily   Lurasidone HCl 60 MG TABS Take 1 tablet (60 mg total) by mouth daily.   metoprolol tartrate (LOPRESSOR) 25 MG tablet Take 25 mg by mouth 2 (two) times daily.   metoprolol tartrate (LOPRESSOR) 25 MG tablet Take  1 tablet (25 mg total) by mouth 2 (two) times daily   nortriptyline (PAMELOR) 25 MG capsule Take 1 capsule (25 mg total) by mouth in the morning AND 2 capsules (50 mg total) at bedtime.   omeprazole (PRILOSEC) 40 MG capsule Take 1 capsule (40 mg total) by mouth 2 (two) times daily as needed.   ondansetron (ZOFRAN-ODT) 4 MG disintegrating tablet Take 4 mg by mouth every 8 (eight) hours as needed for nausea.   ondansetron (ZOFRAN-ODT) 4 MG disintegrating tablet Dissolve 1 tablet (4 mg total) by mouth every 8 (eight) hours as needed.   pregabalin (LYRICA) 25 MG  capsule Take 1 (one) capsule by mouth 3 (three) times daily.   Probiotic Product (PROBIOTIC PO) Take 1 capsule by mouth daily.   Saccharomyces boulardii (PROBIOTIC) 250 MG CAPS Take 1 capsule by mouth in the morning and at bedtime.   sertraline (ZOLOFT) 100 MG tablet Take 1 tablet (100 mg total) by mouth at bedtime.   sodium bicarbonate 650 MG tablet Take 1 tablet (650 mg total) by mouth 3 (three) times daily.   sodium bicarbonate 650 MG tablet Take 1 tablet (650 mg total) by mouth 3 (three) times daily in the morning, in the evening and before bedtime.   sucralfate (CARAFATE) 1 g tablet Take 1 tablet (1 g total) by mouth 2 (two) times daily before meals.   [EXPIRED] terbinafine (LAMISIL) 250 MG tablet Take 1 tablet (250 mg total) by mouth once daily for 90 days.   tiotropium (SPIRIVA) 18 MCG inhalation capsule Place 1 capsule (18 mcg total) into inhaler and inhale daily.   traZODone (DESYREL) 100 MG tablet Take 1 tablet (100 mg total) by mouth at bedtime   traZODone (DESYREL) 50 MG tablet Take 1 tablet (50 mg total) by mouth at bedtime as needed for sleep. (Patient taking differently: Take 50 mg by mouth at bedtime.)   Current Facility-Administered Medications for the 02/03/23 encounter (Office Visit) with Gilda Crease, Latina Craver, MD  Medication   lidocaine HCl (PF) (XYLOCAINE) 2 % injection 50 mL    Past Medical History:  Diagnosis Date   Allergy    Anxiety    Bursitis of both hips    CKD (chronic kidney disease) stage 4, GFR 15-29 ml/min (HCC)    Depression    Frequent headaches    Obesity    Sleep apnea    doesn't use CPAP machine broken,     Past Surgical History:  Procedure Laterality Date   BIOPSY  03/30/2022   Procedure: BIOPSY;  Surgeon: Benancio Deeds, MD;  Location: [facility] ENDOSCOPY;  Service: Gastroenterology;;   CESAREAN SECTION     x2   CHOLECYSTECTOMY     COLONOSCOPY WITH PROPOFOL N/A 11/20/2017   Procedure: COLONOSCOPY WITH PROPOFOL;  Surgeon: Toney Reil, MD;   Location: [facility] ENDOSCOPY;  Service: Gastroenterology;  Laterality: N/A;   COLONOSCOPY WITH PROPOFOL N/A 03/30/2022   Procedure: COLONOSCOPY WITH PROPOFOL;  Surgeon: Benancio Deeds, MD;  Location: [facility] ENDOSCOPY;  Service: Gastroenterology;  Laterality: N/A;   COLONOSCOPY WITH PROPOFOL N/A 08/02/2022   Procedure: COLONOSCOPY WITH PROPOFOL;  Surgeon: Toney Reil, MD;  Location: [facility] ENDOSCOPY;  Service: Gastroenterology;  Laterality: N/A;   ESOPHAGOGASTRODUODENOSCOPY (EGD) WITH PROPOFOL N/A 03/30/2022   Procedure: ESOPHAGOGASTRODUODENOSCOPY (EGD) WITH PROPOFOL;  Surgeon: Benancio Deeds, MD;  Location: [facility] ENDOSCOPY;  Service: Gastroenterology;  Laterality: N/A;   HYSTERECTOMY ABDOMINAL WITH SALPINGECTOMY  1998   HYSTEROSCOPY     LAPAROSCOPIC SUBTOTAL COLECTOMY Right 08/03/2022  Procedure: HAND ASSISTED LAPAROSCOPIC SUBTOTAL COLECTOMY;  Surgeon: Leafy Ro, MD;  Location: [facility] ORS;  Service: General;  Laterality: Right;    Social History Social History   Tobacco Use   Smoking status: Former    Current packs/day: 1.00    Average packs/day: 1 pack/day for 30.0 years (30.0 ttl pk-yrs)    Types: Cigarettes, Cigars    Passive exposure: Past   Smokeless tobacco: Never  Vaping Use   Vaping status: Some Days  Substance Use Topics   Alcohol use: Not Currently    Comment: occ once per year   Drug use: Yes    Types: Marijuana    Comment: Current use    Family History Family History  Problem Relation Age of Onset   Hodgkin's lymphoma Mother    Diabetes Maternal Aunt    Diabetes Maternal Uncle    Alzheimer's disease Maternal Grandfather    Schizophrenia Maternal Grandmother    Heart failure Maternal Grandmother    Emphysema Paternal Grandmother    Cancer Other    Breast cancer Neg Hx     Allergies  Allergen Reactions   Other Anaphylaxis, Itching, Swelling and Other (See Comments)    Black pepper   Black Pepper-Turmeric    Latex Rash   Silicone Rash   Tape Rash  and Other (See Comments)    Paper tape is ok to use.     REVIEW OF SYSTEMS (Negative unless checked)  Constitutional: [] Weight loss  [] Fever  [] Chills Cardiac: [] Chest pain   [] Chest pressure   [] Palpitations   [] Shortness of breath when laying flat   [] Shortness of breath with exertion. Vascular:  [] Pain in legs with walking   [] Pain in legs at rest  [] History of DVT   [] Phlebitis   [] Swelling in legs   [] Varicose veins   [] Non-healing ulcers Pulmonary:   [] Uses home oxygen   [] Productive cough   [] Hemoptysis   [] Wheeze  [] COPD   [] Asthma Neurologic:  [] Dizziness   [] Seizures   [] History of stroke   [] History of TIA  [] Aphasia   [] Vissual changes   [] Weakness or numbness in arm   [] Weakness or numbness in leg Musculoskeletal:   [] Joint swelling   [] Joint pain   [] Low back pain Hematologic:  [] Easy bruising  [] Easy bleeding   [] Hypercoagulable state   [] Anemic Gastrointestinal:  [] Diarrhea   [] Vomiting  [] Gastroesophageal reflux/heartburn   [] Difficulty swallowing. Genitourinary:  [x] Chronic kidney disease   [] Difficult urination  [] Frequent urination   [] Blood in urine Skin:  [] Rashes   [] Ulcers  Psychological:  [] History of anxiety   []  History of major depression.  Physical Examination  Vitals:   02/03/23 1506  BP: 117/72  Pulse: 75  Resp: 16  Weight: 177 lb 3.2 oz (80.4 kg)   Body mass index is 37.03 kg/m. Gen: WD/WN, NAD Head: [place]/AT, No temporalis wasting.  Ear/Nose/Throat: Hearing grossly intact, nares w/o erythema or drainage Eyes: PER, EOMI, sclera nonicteric.  Neck: Supple, no gross masses or lesions.  No JVD.  Pulmonary:  Good air movement, no audible wheezing, no use of accessory muscles.  Cardiac: RRR, precordium non-hyperdynamic. Vascular:   No visible superficial veins identified either upper extremity Vessel Right Left  Radial Palpable Palpable  Brachial Palpable Palpable  Gastrointestinal: soft, non-distended. No guarding/no peritoneal signs.   Musculoskeletal: M/S 5/5 throughout.  No deformity.  Neurologic: CN 2-12 intact. Pain and light touch intact in extremities.  Symmetrical.  Speech is fluent. Motor exam as listed above. Psychiatric:  Judgment intact, Mood & affect appropriate for pt's clinical situation. Dermatologic: No rashes or ulcers noted.  No changes consistent with cellulitis.   CBC Lab Results  Component Value Date   WBC 10.3 08/13/2022   HGB 9.4 (L) 08/13/2022   HCT 29.5 (L) 08/13/2022   MCV 93.1 08/13/2022   PLT 676 (H) 08/13/2022    BMET    Component Value Date/Time   NA 142 08/13/2022 1549   NA 137 04/20/2012 1725   K 3.8 08/13/2022 1549   K 4.2 04/20/2012 1725   CL 111 08/13/2022 1549   CL 106 04/20/2012 1725   CO2 23 08/13/2022 1549   CO2 24 04/20/2012 1725   GLUCOSE 108 (H) 08/13/2022 1549   GLUCOSE 97 04/20/2012 1725   BUN 16 08/13/2022 1549   BUN 14 04/20/2012 1725   CREATININE 2.19 (H) 08/13/2022 1549   CREATININE 0.83 09/30/2019 1057   CALCIUM 8.6 (L) 08/13/2022 1549   CALCIUM 9.2 04/20/2012 1725   GFRNONAA 26 (L) 08/13/2022 1549   GFRNONAA 81 09/30/2019 1057   GFRAA 93 09/30/2019 1057   CrCl cannot be calculated ([name] most recent lab result is older than the maximum 21 days allowed.).  COAG Lab Results  Component Value Date   INR 1.1 07/31/2022   INR 1.4 (H) 05/29/2022   INR 1.4 (H) 03/31/2022    Radiology No results found.   Assessment/Plan 1. Chronic kidney disease (CKD), stage V (HCC) Recommend:  At this time the patient does not have appropriate extremity access for dialysis  Patient should have a left brachial axillary graft created.  The risks, benefits and alternative therapies were reviewed in detail with the patient.  All questions were answered.  The patient agrees to proceed with surgery.   The patient will follow up with me in the office after the surgery.  2. Essential hypertension Continue antihypertensive medications as already ordered, these  medications have been reviewed and there are no changes at this time.  3. Centrilobular emphysema (HCC) Continue pulmonary medications and aerosols as already ordered, these medications have been reviewed and there are no changes at this time.   4. Controlled type 2 diabetes mellitus with complication, without long-term current use of insulin (HCC) Continue hypoglycemic medications as already ordered, these medications have been reviewed and there are no changes at this time.  Hgb A1C to be monitored as already arranged by primary service  5. Hyperlipidemia associated with type 2 diabetes mellitus (HCC) Continue statin as ordered and reviewed, no changes at this time    [name], MD  02/09/2023 1:28 PM

## 2023-02-09 NOTE — H&P (View-Only) (Signed)
 MRN : [id number]  Darlene Barber is a 57 y.o. ([birth year]) female who presents with chief complaint of check access.  History of Present Illness:  The patient is seen for evaluation for dialysis access. The patient has chronic renal insufficiency stage V secondary to hypertension. The patient's most recent creatinine clearance is less than 20. The patient volume status has not yet become an issue. [name] blood pressures been relatively well controlled. There are mild uremic symptoms which appear to be relatively well tolerated at this time.  The patient notes the kidney problem has been present for a long time and has been progressively getting worse.  The patient is followed by nephrology.    The patient is right-handed.  The patient has been considering the various methods of dialysis and wishes to proceed with hemodialysis and therefore creation of AV access is indicated.  No recent shortening of the patient's walking distance or new symptoms consistent with claudication.  No history of rest pain symptoms. No new ulcers or wounds of the lower extremities have occurred.  The patient denies amaurosis fugax or recent TIA symptoms. There are no recent neurological changes noted. There is no history of DVT, PE or superficial thrombophlebitis. No recent episodes of angina or shortness of breath documented.     The previous vein mapping was negative for adequate vein for fistula creation   Current Meds  Medication Sig   acetaminophen (TYLENOL) 500 MG tablet Take 2 tablets (1,000 mg total) by mouth every 6 (six) hours.   acyclovir (ZOVIRAX) 400 MG tablet Take 1 tablet (400 mg total) by mouth every 8 (eight) hours.   albuterol (VENTOLIN HFA) 108 (90 Base) MCG/ACT inhaler Inhale 2 puffs into the lungs every 4 (four) hours as needed.   albuterol (VENTOLIN HFA) 108 (90 Base) MCG/ACT inhaler Inhale 2 puffs into the lungs every 4 (four) hours as needed.   Albuterol  Sulfate (PROAIR RESPICLICK) 108 (90 Base) MCG/ACT AEPB Inhale 2 puffs into the lungs 4 (four) times daily.   amLODipine (NORVASC) 10 MG tablet Take 1 tablet (10 mg total) by mouth daily.   B Complex-C (SUPER B COMPLEX PO) Take 1 tablet by mouth daily with breakfast.   BREYNA 160-4.5 MCG/ACT inhaler Inhale 2 puffs into the lungs 2 (two) times daily.   Calcium Carbonate Antacid (ANTACID SOFT CHEWS PO) Take 1 tablet by mouth every 6 (six) hours as needed (for indigesation- CHEW).   EMGALITY 120 MG/ML SOSY Inject into the skin every 30 (thirty) days.   fluticasone (FLONASE) 50 MCG/ACT nasal spray 1 SPARY IN EACH NOSTRIL DAILY (Patient taking differently: Place 1 spray into both nostrils daily.)   fluticasone (FLONASE) 50 MCG/ACT nasal spray Place 2 sprays into both nostrils daily.   Galcanezumab-gnlm (EMGALITY) 120 MG/ML SOAJ Inject 120 mg into the skin every 30 (thirty) days.   hydrALAZINE (APRESOLINE) 25 MG tablet Take 1 tablet (25 mg total) by mouth in the morning and 1 tablet (25 mg total) in the evening and 1 tablet (25 mg total) before bedtime.   loratadine (CLARITIN) 10 MG tablet Take 1 tablet (10 mg total) by mouth once daily   Lurasidone HCl 60 MG TABS Take 1 tablet (60 mg total) by mouth daily.   metoprolol tartrate (LOPRESSOR) 25 MG tablet Take 25 mg by mouth 2 (two) times daily.   metoprolol tartrate (LOPRESSOR) 25 MG tablet Take  1 tablet (25 mg total) by mouth 2 (two) times daily   nortriptyline (PAMELOR) 25 MG capsule Take 1 capsule (25 mg total) by mouth in the morning AND 2 capsules (50 mg total) at bedtime.   omeprazole (PRILOSEC) 40 MG capsule Take 1 capsule (40 mg total) by mouth 2 (two) times daily as needed.   ondansetron (ZOFRAN-ODT) 4 MG disintegrating tablet Take 4 mg by mouth every 8 (eight) hours as needed for nausea.   ondansetron (ZOFRAN-ODT) 4 MG disintegrating tablet Dissolve 1 tablet (4 mg total) by mouth every 8 (eight) hours as needed.   pregabalin (LYRICA) 25 MG  capsule Take 1 (one) capsule by mouth 3 (three) times daily.   Probiotic Product (PROBIOTIC PO) Take 1 capsule by mouth daily.   Saccharomyces boulardii (PROBIOTIC) 250 MG CAPS Take 1 capsule by mouth in the morning and at bedtime.   sertraline (ZOLOFT) 100 MG tablet Take 1 tablet (100 mg total) by mouth at bedtime.   sodium bicarbonate 650 MG tablet Take 1 tablet (650 mg total) by mouth 3 (three) times daily.   sodium bicarbonate 650 MG tablet Take 1 tablet (650 mg total) by mouth 3 (three) times daily in the morning, in the evening and before bedtime.   sucralfate (CARAFATE) 1 g tablet Take 1 tablet (1 g total) by mouth 2 (two) times daily before meals.   [EXPIRED] terbinafine (LAMISIL) 250 MG tablet Take 1 tablet (250 mg total) by mouth once daily for 90 days.   tiotropium (SPIRIVA) 18 MCG inhalation capsule Place 1 capsule (18 mcg total) into inhaler and inhale daily.   traZODone (DESYREL) 100 MG tablet Take 1 tablet (100 mg total) by mouth at bedtime   traZODone (DESYREL) 50 MG tablet Take 1 tablet (50 mg total) by mouth at bedtime as needed for sleep. (Patient taking differently: Take 50 mg by mouth at bedtime.)   Current Facility-Administered Medications for the 02/03/23 encounter (Office Visit) with Gilda Crease, Latina Craver, MD  Medication   lidocaine HCl (PF) (XYLOCAINE) 2 % injection 50 mL    Past Medical History:  Diagnosis Date   Allergy    Anxiety    Bursitis of both hips    CKD (chronic kidney disease) stage 4, GFR 15-29 ml/min (HCC)    Depression    Frequent headaches    Obesity    Sleep apnea    doesn't use CPAP machine broken,     Past Surgical History:  Procedure Laterality Date   BIOPSY  03/30/2022   Procedure: BIOPSY;  Surgeon: Benancio Deeds, MD;  Location: [facility] ENDOSCOPY;  Service: Gastroenterology;;   CESAREAN SECTION     x2   CHOLECYSTECTOMY     COLONOSCOPY WITH PROPOFOL N/A 11/20/2017   Procedure: COLONOSCOPY WITH PROPOFOL;  Surgeon: Toney Reil, MD;   Location: [facility] ENDOSCOPY;  Service: Gastroenterology;  Laterality: N/A;   COLONOSCOPY WITH PROPOFOL N/A 03/30/2022   Procedure: COLONOSCOPY WITH PROPOFOL;  Surgeon: Benancio Deeds, MD;  Location: [facility] ENDOSCOPY;  Service: Gastroenterology;  Laterality: N/A;   COLONOSCOPY WITH PROPOFOL N/A 08/02/2022   Procedure: COLONOSCOPY WITH PROPOFOL;  Surgeon: Toney Reil, MD;  Location: [facility] ENDOSCOPY;  Service: Gastroenterology;  Laterality: N/A;   ESOPHAGOGASTRODUODENOSCOPY (EGD) WITH PROPOFOL N/A 03/30/2022   Procedure: ESOPHAGOGASTRODUODENOSCOPY (EGD) WITH PROPOFOL;  Surgeon: Benancio Deeds, MD;  Location: [facility] ENDOSCOPY;  Service: Gastroenterology;  Laterality: N/A;   HYSTERECTOMY ABDOMINAL WITH SALPINGECTOMY  1998   HYSTEROSCOPY     LAPAROSCOPIC SUBTOTAL COLECTOMY Right 08/03/2022  Procedure: HAND ASSISTED LAPAROSCOPIC SUBTOTAL COLECTOMY;  Surgeon: Leafy Ro, MD;  Location: [facility] ORS;  Service: General;  Laterality: Right;    Social History Social History   Tobacco Use   Smoking status: Former    Current packs/day: 1.00    Average packs/day: 1 pack/day for 30.0 years (30.0 ttl pk-yrs)    Types: Cigarettes, Cigars    Passive exposure: Past   Smokeless tobacco: Never  Vaping Use   Vaping status: Some Days  Substance Use Topics   Alcohol use: Not Currently    Comment: occ once per year   Drug use: Yes    Types: Marijuana    Comment: Current use    Family History Family History  Problem Relation Age of Onset   Hodgkin's lymphoma Mother    Diabetes Maternal Aunt    Diabetes Maternal Uncle    Alzheimer's disease Maternal Grandfather    Schizophrenia Maternal Grandmother    Heart failure Maternal Grandmother    Emphysema Paternal Grandmother    Cancer Other    Breast cancer Neg Hx     Allergies  Allergen Reactions   Other Anaphylaxis, Itching, Swelling and Other (See Comments)    Black pepper   Black Pepper-Turmeric    Latex Rash   Silicone Rash   Tape Rash  and Other (See Comments)    Paper tape is ok to use.     REVIEW OF SYSTEMS (Negative unless checked)  Constitutional: [] Weight loss  [] Fever  [] Chills Cardiac: [] Chest pain   [] Chest pressure   [] Palpitations   [] Shortness of breath when laying flat   [] Shortness of breath with exertion. Vascular:  [] Pain in legs with walking   [] Pain in legs at rest  [] History of DVT   [] Phlebitis   [] Swelling in legs   [] Varicose veins   [] Non-healing ulcers Pulmonary:   [] Uses home oxygen   [] Productive cough   [] Hemoptysis   [] Wheeze  [] COPD   [] Asthma Neurologic:  [] Dizziness   [] Seizures   [] History of stroke   [] History of TIA  [] Aphasia   [] Vissual changes   [] Weakness or numbness in arm   [] Weakness or numbness in leg Musculoskeletal:   [] Joint swelling   [] Joint pain   [] Low back pain Hematologic:  [] Easy bruising  [] Easy bleeding   [] Hypercoagulable state   [] Anemic Gastrointestinal:  [] Diarrhea   [] Vomiting  [] Gastroesophageal reflux/heartburn   [] Difficulty swallowing. Genitourinary:  [x] Chronic kidney disease   [] Difficult urination  [] Frequent urination   [] Blood in urine Skin:  [] Rashes   [] Ulcers  Psychological:  [] History of anxiety   []  History of major depression.  Physical Examination  Vitals:   02/03/23 1506  BP: 117/72  Pulse: 75  Resp: 16  Weight: 177 lb 3.2 oz (80.4 kg)   Body mass index is 37.03 kg/m. Gen: WD/WN, NAD Head: [place]/AT, No temporalis wasting.  Ear/Nose/Throat: Hearing grossly intact, nares w/o erythema or drainage Eyes: PER, EOMI, sclera nonicteric.  Neck: Supple, no gross masses or lesions.  No JVD.  Pulmonary:  Good air movement, no audible wheezing, no use of accessory muscles.  Cardiac: RRR, precordium non-hyperdynamic. Vascular:   No visible superficial veins identified either upper extremity Vessel Right Left  Radial Palpable Palpable  Brachial Palpable Palpable  Gastrointestinal: soft, non-distended. No guarding/no peritoneal signs.   Musculoskeletal: M/S 5/5 throughout.  No deformity.  Neurologic: CN 2-12 intact. Pain and light touch intact in extremities.  Symmetrical.  Speech is fluent. Motor exam as listed above. Psychiatric:  Judgment intact, Mood & affect appropriate for pt's clinical situation. Dermatologic: No rashes or ulcers noted.  No changes consistent with cellulitis.   CBC Lab Results  Component Value Date   WBC 10.3 08/13/2022   HGB 9.4 (L) 08/13/2022   HCT 29.5 (L) 08/13/2022   MCV 93.1 08/13/2022   PLT 676 (H) 08/13/2022    BMET    Component Value Date/Time   NA 142 08/13/2022 1549   NA 137 04/20/2012 1725   K 3.8 08/13/2022 1549   K 4.2 04/20/2012 1725   CL 111 08/13/2022 1549   CL 106 04/20/2012 1725   CO2 23 08/13/2022 1549   CO2 24 04/20/2012 1725   GLUCOSE 108 (H) 08/13/2022 1549   GLUCOSE 97 04/20/2012 1725   BUN 16 08/13/2022 1549   BUN 14 04/20/2012 1725   CREATININE 2.19 (H) 08/13/2022 1549   CREATININE 0.83 09/30/2019 1057   CALCIUM 8.6 (L) 08/13/2022 1549   CALCIUM 9.2 04/20/2012 1725   GFRNONAA 26 (L) 08/13/2022 1549   GFRNONAA 81 09/30/2019 1057   GFRAA 93 09/30/2019 1057   CrCl cannot be calculated ([name] most recent lab result is older than the maximum 21 days allowed.).  COAG Lab Results  Component Value Date   INR 1.1 07/31/2022   INR 1.4 (H) 05/29/2022   INR 1.4 (H) 03/31/2022    Radiology No results found.   Assessment/Plan 1. Chronic kidney disease (CKD), stage V (HCC) Recommend:  At this time the patient does not have appropriate extremity access for dialysis  Patient should have a left brachial axillary graft created.  The risks, benefits and alternative therapies were reviewed in detail with the patient.  All questions were answered.  The patient agrees to proceed with surgery.   The patient will follow up with me in the office after the surgery.  2. Essential hypertension Continue antihypertensive medications as already ordered, these  medications have been reviewed and there are no changes at this time.  3. Centrilobular emphysema (HCC) Continue pulmonary medications and aerosols as already ordered, these medications have been reviewed and there are no changes at this time.   4. Controlled type 2 diabetes mellitus with complication, without long-term current use of insulin (HCC) Continue hypoglycemic medications as already ordered, these medications have been reviewed and there are no changes at this time.  Hgb A1C to be monitored as already arranged by primary service  5. Hyperlipidemia associated with type 2 diabetes mellitus (HCC) Continue statin as ordered and reviewed, no changes at this time    [name], MD  02/09/2023 1:28 PM

## 2023-02-10 ENCOUNTER — Other Ambulatory Visit: Payer: Self-pay

## 2023-02-11 ENCOUNTER — Other Ambulatory Visit: Payer: Self-pay

## 2023-02-12 ENCOUNTER — Other Ambulatory Visit: Payer: Self-pay

## 2023-02-13 ENCOUNTER — Other Ambulatory Visit: Payer: Self-pay

## 2023-02-14 ENCOUNTER — Other Ambulatory Visit: Payer: Self-pay

## 2023-02-17 ENCOUNTER — Telehealth (INDEPENDENT_AMBULATORY_CARE_PROVIDER_SITE_OTHER): Payer: Self-pay

## 2023-02-17 NOTE — Telephone Encounter (Signed)
Spoke with the patient and she will be scheduled for a left brachial axillary graft with Dr. Gilda Crease on 02/28/23 at the [facility]. Pre-op is on 02/20/23 at 1:00 pm at the [facility]. Pre-surgical instructions were discussed and will be mailed. Patient stated she did not know how to use her Mychart.

## 2023-02-18 ENCOUNTER — Other Ambulatory Visit: Payer: Self-pay

## 2023-02-18 MED ORDER — METOPROLOL TARTRATE 50 MG PO TABS
ORAL_TABLET | ORAL | 11 refills | Status: DC
  Filled 2023-02-18: qty 60, 30d supply, fill #0
  Filled 2023-03-24: qty 60, 30d supply, fill #1
  Filled 2023-04-25 – 2023-05-06 (×2): qty 60, 30d supply, fill #2

## 2023-02-18 NOTE — Telephone Encounter (Signed)
Pt has [employer] thru [place], [place] is not approving, I will have to fax updated information or a new card? Sent verification thru [employer] and they need more information.

## 2023-02-18 NOTE — Telephone Encounter (Signed)
PT LMOM asking about starting BOTOX again.

## 2023-02-20 ENCOUNTER — Other Ambulatory Visit (INDEPENDENT_AMBULATORY_CARE_PROVIDER_SITE_OTHER): Payer: Self-pay | Admitting: Nurse Practitioner

## 2023-02-20 ENCOUNTER — Other Ambulatory Visit: Payer: Self-pay

## 2023-02-20 ENCOUNTER — Encounter
Admission: RE | Admit: 2023-02-20 | Discharge: 2023-02-20 | Disposition: A | Discharge status: Home / Self Care | Payer: Medicaid Other | Source: Ambulatory Visit | Attending: Vascular Surgery | Admitting: Vascular Surgery

## 2023-02-20 VITALS — Ht <= 58 in | Wt 176.0 lb

## 2023-02-20 DIAGNOSIS — N185 Chronic kidney disease, stage 5: Secondary | ICD-10-CM

## 2023-02-20 DIAGNOSIS — E118 Type 2 diabetes mellitus with unspecified complications: Secondary | ICD-10-CM

## 2023-02-20 DIAGNOSIS — N186 End stage renal disease: Secondary | ICD-10-CM

## 2023-02-20 HISTORY — DX: Anemia, unspecified: D64.9

## 2023-02-20 HISTORY — DX: Chronic obstructive pulmonary disease, unspecified: J44.9

## 2023-02-20 HISTORY — DX: Gastro-esophageal reflux disease without esophagitis: K21.9

## 2023-02-20 NOTE — Patient Instructions (Addendum)
Your procedure is scheduled on: Friday 02/28/23 To find out your arrival time, please call [phone] between 1PM - 3PM on:  Thursday 02/27/23  Report to the Registration Desk on the 1st floor of the [facility]. FREE Valet parking is available.  If your arrival time is 6:00 am, do not arrive before that time as the [facility] entrance doors do not open until 6:00 am.  REMEMBER: Instructions that are not followed completely may result in serious medical risk, up to and including death; or upon the discretion of your surgeon and anesthesiologist your surgery may need to be rescheduled.  Do not eat food or drink any liquids after midnight the night before surgery.  No gum chewing or hard candies.  One week prior to surgery: Stop Anti-inflammatories (NSAIDS) such as Advil, Aleve, Ibuprofen, Motrin, Naproxen, Naprosyn and Aspirin based products such as Excedrin, Goody's Powder, BC Powder. You may however, continue to take Tylenol if needed for pain up until the day of surgery.  Stop ANY OVER THE COUNTER supplements and vitamins for 7 days until after surgery.  Continue taking all prescribed medications.   TAKE ONLY THESE MEDICATIONS THE MORNING OF SURGERY WITH A SIP OF WATER:  acyclovir (ZOVIRAX) 400 MG tablet  amLODipine (NORVASC) 10 MG tablet  hydrALAZINE (APRESOLINE) 25 MG tablet  loratadine (CLARITIN) 10 MG tablet  Lurasidone HCl 60 MG TABS  metoprolol tartrate (LOPRESSOR) 50 MG tablet  nortriptyline (PAMELOR) 25 MG capsule  omeprazole (PRILOSEC) 40 MG capsule Antacid (take one the night before and one on the morning of surgery - helps to prevent nausea after surgery.) pregabalin (LYRICA) 25 MG capsule   Use inhalers on the day of surgery and bring to the hospital.  No Alcohol for 24 hours before or after surgery.  No Smoking including e-cigarettes for 24 hours before surgery.  No chewable tobacco products for at least 6 hours before surgery.  No nicotine patches on the  day of surgery.  Do not use any "recreational" drugs for at least a week (preferably 2 weeks) before your surgery.  Please be advised that the combination of cocaine and anesthesia may have negative outcomes, up to and including death. If you test positive for cocaine, your surgery will be cancelled.  On the morning of surgery brush your teeth with toothpaste and water, you may rinse your mouth with mouthwash if you wish. Do not swallow any toothpaste or mouthwash.  Use CHG Soap or wipes as directed on instruction sheet.  Do not wear lotions, powders, or perfumes. NO DEODORANT  Do not shave body hair from the neck down 48 hours before surgery.  Wear comfortable clothing (specific to your surgery type) to the hospital.  Do not wear jewelry, make-up, hairpins, clips or nail polish.  For welded (permanent) jewelry: bracelets, anklets, waist bands, etc.  Please have this removed prior to surgery.  If it is not removed, there is a chance that hospital personnel will need to cut it off on the day of surgery. Contact lenses, hearing aids and dentures may not be worn into surgery.  Do not bring valuables to the hospital. [facility] is not responsible for any missing/lost belongings or valuables.   Notify your doctor if there is any change in your medical condition (cold, fever, infection).  If you are being discharged the day of surgery, you will not be allowed to drive home. You will need a responsible individual to drive you home and stay with you for 24 hours  after surgery.   If you are taking public transportation, you will need to have a responsible individual with you.  If you are being admitted to the hospital overnight, leave your suitcase in the car. After surgery it may be brought to your room.  In case of increased patient census, it may be necessary for you, the patient, to continue your postoperative care in the Same Day Surgery department.  After surgery, you can help  prevent lung complications by doing breathing exercises.  Take deep breaths and cough every 1-2 hours. Your doctor may order a device called an Incentive Spirometer to help you take deep breaths. When coughing or sneezing, hold a pillow firmly against your incision with both hands. This is called "splinting." Doing this helps protect your incision. It also decreases belly discomfort.  Surgery Visitation Policy:  Patients undergoing a surgery or procedure may have two family members or support persons with them as long as the person is not COVID-19 positive or experiencing its symptoms.   Inpatient Visitation:    Visiting hours are 7 a.m. to 8 p.m. Up to four visitors are allowed at one time in a patient room. The visitors may rotate out with other people during the day. One designated support person (adult) may remain overnight.  Please call the Pre-admissions Testing Dept. at [phone] if you have any questions about these instructions.     Preparing for Surgery with CHLORHEXIDINE GLUCONATE (CHG) Soap  Chlorhexidine Gluconate (CHG) Soap  o An antiseptic cleaner that kills germs and bonds with the skin to continue killing germs even after washing  o Used for showering the night before surgery and morning of surgery  Before surgery, you can play an important role by reducing the number of germs on your skin.  CHG (Chlorhexidine gluconate) soap is an antiseptic cleanser which kills germs and bonds with the skin to continue killing germs even after washing.  Please do not use if you have an allergy to CHG or antibacterial soaps. If your skin becomes reddened/irritated stop using the CHG.  1. Shower the NIGHT BEFORE SURGERY and the MORNING OF SURGERY with CHG soap.  2. If you choose to wash your hair, wash your hair first as usual with your normal shampoo.  3. After shampooing, rinse your hair and body thoroughly to remove the shampoo.  4. Use CHG as you would any other liquid  soap. You can apply CHG directly to the skin and wash gently with a scrungie or a clean washcloth.  5. Apply the CHG soap to your body only from the neck down. Do not use on open wounds or open sores. Avoid contact with your eyes, ears, mouth, and genitals (private parts). Wash face and genitals (private parts) with your normal soap.  6. Wash thoroughly, paying special attention to the area where your surgery will be performed.  7. Thoroughly rinse your body with warm water.  8. Do not shower/wash with your normal soap after using and rinsing off the CHG soap.  9. Pat yourself dry with a clean towel.  10. Wear clean pajamas to bed the night before surgery.  12. Place clean sheets on your bed the night of your first shower and do not sleep with pets.  13. Shower again with the CHG soap on the day of surgery prior to arriving at the hospital.  14. Do not apply any deodorants/lotions/powders.  15. Please wear clean clothes to the hospital.

## 2023-02-21 ENCOUNTER — Encounter
Admission: RE | Admit: 2023-02-21 | Discharge: 2023-02-21 | Disposition: A | Discharge status: Home / Self Care | Payer: Medicaid Other | Source: Ambulatory Visit | Attending: Vascular Surgery | Admitting: Vascular Surgery

## 2023-02-21 DIAGNOSIS — Z01818 Encounter for other preprocedural examination: Secondary | ICD-10-CM | POA: Insufficient documentation

## 2023-02-21 DIAGNOSIS — Z992 Dependence on renal dialysis: Secondary | ICD-10-CM | POA: Insufficient documentation

## 2023-02-21 DIAGNOSIS — N186 End stage renal disease: Secondary | ICD-10-CM | POA: Insufficient documentation

## 2023-02-21 LAB — TYPE AND SCREEN
ABO/RH(D): A POS
Antibody Screen: NEGATIVE

## 2023-02-24 NOTE — Telephone Encounter (Signed)
Spoke with [employer] and pt was terminated 07/09/22, spoke with pt and she states she has regular medicaid, and it should be active. Pt has [employer] and I will check again to see if they will cover Botox.

## 2023-02-26 ENCOUNTER — Other Ambulatory Visit: Payer: Self-pay

## 2023-02-26 MED ORDER — TRAZODONE HCL 150 MG PO TABS
150.0000 mg | ORAL_TABLET | Freq: Every day | ORAL | 5 refills | Status: DC
  Filled 2023-02-26: qty 30, 30d supply, fill #0
  Filled 2023-04-07: qty 30, 30d supply, fill #1
  Filled 2023-04-25 – 2023-05-09 (×4): qty 30, 30d supply, fill #2
  Filled 2023-05-15 – 2023-06-02 (×3): qty 30, 30d supply, fill #3
  Filled 2023-06-25 – 2023-07-01 (×3): qty 30, 30d supply, fill #4
  Filled 2023-07-21 – 2023-08-15 (×5): qty 30, 30d supply, fill #5
  Filled ????-??-??: fill #3

## 2023-02-26 MED ORDER — PREGABALIN 25 MG PO CAPS
25.0000 mg | ORAL_CAPSULE | Freq: Three times a day (TID) | ORAL | 3 refills | Status: DC
  Filled 2023-02-26 – 2023-03-24 (×2): qty 90, 30d supply, fill #0
  Filled 2023-05-08: qty 90, 30d supply, fill #1
  Filled 2023-06-17: qty 90, 30d supply, fill #2
  Filled 2023-07-21: qty 90, 30d supply, fill #3

## 2023-02-26 MED ORDER — AJOVY 225 MG/1.5ML ~~LOC~~ SOAJ
225.0000 mg | SUBCUTANEOUS | 5 refills | Status: DC
  Filled 2023-02-26: qty 1.5, 30d supply, fill #0

## 2023-02-27 ENCOUNTER — Other Ambulatory Visit: Payer: Self-pay

## 2023-02-27 MED ORDER — ORAL CARE MOUTH RINSE: 15.0000 mL | Freq: Once | OROMUCOSAL | Status: AC

## 2023-02-27 MED ORDER — CEFAZOLIN SODIUM-DEXTROSE 2-4 GM/100ML-% IV SOLN
2.0000 g | INTRAVENOUS | Status: AC
  Administered 2023-02-28: 2 g via INTRAVENOUS

## 2023-02-27 MED ORDER — CHLORHEXIDINE GLUCONATE CLOTH 2 % EX PADS
6.0000 | MEDICATED_PAD | Freq: Once | CUTANEOUS | Status: AC
  Administered 2023-02-28: 6 via TOPICAL

## 2023-02-27 MED ORDER — CHLORHEXIDINE GLUCONATE 0.12 % MT SOLN
15.0000 mL | Freq: Once | OROMUCOSAL | Status: AC
  Administered 2023-02-28: 15 mL via OROMUCOSAL

## 2023-02-27 MED ORDER — SODIUM CHLORIDE 0.9 % IV SOLN: INTRAVENOUS | Status: DC

## 2023-02-27 MED ORDER — CHLORHEXIDINE GLUCONATE CLOTH 2 % EX PADS: 6.0000 | MEDICATED_PAD | Freq: Once | CUTANEOUS | Status: DC
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
## 2023-02-28 ENCOUNTER — Ambulatory Visit: Payer: Medicaid Other | Admitting: Certified Registered"

## 2023-02-28 ENCOUNTER — Other Ambulatory Visit: Payer: Self-pay

## 2023-02-28 ENCOUNTER — Encounter: Payer: Self-pay | Admitting: Vascular Surgery

## 2023-02-28 ENCOUNTER — Ambulatory Visit
Admission: RE | Admit: 2023-02-28 | Discharge: 2023-02-28 | Disposition: A | Discharge status: Home / Self Care | Payer: Medicaid Other | Attending: Vascular Surgery | Admitting: Vascular Surgery

## 2023-02-28 ENCOUNTER — Encounter
Admission: RE | Disposition: A | Discharge status: Home / Self Care | Payer: Self-pay | Source: Home / Self Care | Attending: Vascular Surgery

## 2023-02-28 ENCOUNTER — Ambulatory Visit: Payer: Medicaid Other | Admitting: Urgent Care

## 2023-02-28 DIAGNOSIS — G4730 Sleep apnea, unspecified: Secondary | ICD-10-CM | POA: Diagnosis not present

## 2023-02-28 DIAGNOSIS — I120 Hypertensive chronic kidney disease with stage 5 chronic kidney disease or end stage renal disease: Secondary | ICD-10-CM | POA: Diagnosis not present

## 2023-02-28 DIAGNOSIS — N186 End stage renal disease: Secondary | ICD-10-CM | POA: Diagnosis present

## 2023-02-28 DIAGNOSIS — E669 Obesity, unspecified: Secondary | ICD-10-CM | POA: Insufficient documentation

## 2023-02-28 DIAGNOSIS — Z87891 Personal history of nicotine dependence: Secondary | ICD-10-CM | POA: Insufficient documentation

## 2023-02-28 DIAGNOSIS — Z6837 Body mass index (BMI) 37.0-37.9, adult: Secondary | ICD-10-CM | POA: Insufficient documentation

## 2023-02-28 DIAGNOSIS — E1122 Type 2 diabetes mellitus with diabetic chronic kidney disease: Secondary | ICD-10-CM | POA: Diagnosis not present

## 2023-02-28 DIAGNOSIS — E118 Type 2 diabetes mellitus with unspecified complications: Secondary | ICD-10-CM

## 2023-02-28 DIAGNOSIS — J432 Centrilobular emphysema: Secondary | ICD-10-CM | POA: Insufficient documentation

## 2023-02-28 DIAGNOSIS — E785 Hyperlipidemia, unspecified: Secondary | ICD-10-CM | POA: Insufficient documentation

## 2023-02-28 DIAGNOSIS — N185 Chronic kidney disease, stage 5: Secondary | ICD-10-CM

## 2023-02-28 HISTORY — PX: AV FISTULA PLACEMENT: SHX1204

## 2023-02-28 LAB — POCT I-STAT, CHEM 8
BUN: 29 mg/dL — ABNORMAL HIGH (ref 6–20)
Calcium, Ion: 1.25 mmol/L (ref 1.15–1.40)
Chloride: 107 mmol/L (ref 98–111)
Creatinine, Ser: 2.6 mg/dL — ABNORMAL HIGH (ref 0.44–1.00)
Glucose, Bld: 103 mg/dL — ABNORMAL HIGH (ref 70–99)
HCT: 27 % — ABNORMAL LOW (ref 36.0–46.0)
Hemoglobin: 9.2 g/dL — ABNORMAL LOW (ref 12.0–15.0)
Potassium: 4.2 mmol/L (ref 3.5–5.1)
Sodium: 139 mmol/L (ref 135–145)
TCO2: 21 mmol/L — ABNORMAL LOW (ref 22–32)

## 2023-02-28 LAB — GLUCOSE, CAPILLARY: Glucose-Capillary: 131 mg/dL — ABNORMAL HIGH (ref 70–99)

## 2023-02-28 SURGERY — INSERTION OF ARTERIOVENOUS (AV) GORE-TEX GRAFT ARM
Anesthesia: General | Laterality: Left

## 2023-02-28 MED ORDER — ESMOLOL HCL 100 MG/10ML IV SOLN
INTRAVENOUS | Status: DC | PRN
  Administered 2023-02-28: 20 mg via INTRAVENOUS

## 2023-02-28 MED ORDER — HEPARIN SODIUM (PORCINE) 5000 UNIT/ML IJ SOLN
INTRAMUSCULAR | Status: AC
  Filled 2023-02-28: qty 1

## 2023-02-28 MED ORDER — FENTANYL CITRATE (PF) 100 MCG/2ML IJ SOLN
INTRAMUSCULAR | Status: AC
  Filled 2023-02-28: qty 2

## 2023-02-28 MED ORDER — SODIUM CHLORIDE 0.9 % IV SOLN
INTRAVENOUS | Status: DC | PRN
  Administered 2023-02-28: 501 mL via SURGICAL_CAVITY

## 2023-02-28 MED ORDER — ONDANSETRON HCL 4 MG/2ML IJ SOLN
INTRAMUSCULAR | Status: DC | PRN
  Administered 2023-02-28 (×2): 4 mg via INTRAVENOUS

## 2023-02-28 MED ORDER — BUPIVACAINE HCL (PF) 0.5 % IJ SOLN
INTRAMUSCULAR | Status: AC
  Filled 2023-02-28: qty 30

## 2023-02-28 MED ORDER — PROPOFOL 1000 MG/100ML IV EMUL
INTRAVENOUS | Status: AC
  Filled 2023-02-28: qty 100

## 2023-02-28 MED ORDER — MIDAZOLAM HCL 2 MG/2ML IJ SOLN
INTRAMUSCULAR | Status: AC
Start: 2023-02-28 — End: ?
  Filled 2023-02-28: qty 2

## 2023-02-28 MED ORDER — FENTANYL CITRATE (PF) 100 MCG/2ML IJ SOLN: 25.0000 ug | INTRAMUSCULAR | Status: DC | PRN

## 2023-02-28 MED ORDER — CEFAZOLIN SODIUM-DEXTROSE 2-4 GM/100ML-% IV SOLN
INTRAVENOUS | Status: AC
  Filled 2023-02-28: qty 100

## 2023-02-28 MED ORDER — LIDOCAINE HCL (CARDIAC) PF 100 MG/5ML IV SOSY
PREFILLED_SYRINGE | INTRAVENOUS | Status: DC | PRN
  Administered 2023-02-28: 100 mg via INTRAVENOUS

## 2023-02-28 MED ORDER — GLYCOPYRROLATE 0.2 MG/ML IJ SOLN
INTRAMUSCULAR | Status: DC | PRN
  Administered 2023-02-28: .2 mg via INTRAVENOUS

## 2023-02-28 MED ORDER — ONDANSETRON HCL 4 MG/2ML IJ SOLN: 4.0000 mg | Freq: Once | INTRAMUSCULAR | Status: DC | PRN

## 2023-02-28 MED ORDER — DEXMEDETOMIDINE HCL IN NACL 200 MCG/50ML IV SOLN
INTRAVENOUS | Status: DC | PRN
  Administered 2023-02-28: 8 ug via INTRAVENOUS
  Administered 2023-02-28: 4 ug via INTRAVENOUS
  Administered 2023-02-28: 8 ug via INTRAVENOUS

## 2023-02-28 MED ORDER — HYDROCODONE-ACETAMINOPHEN 5-325 MG PO TABS: 1.0000 | ORAL_TABLET | Freq: Four times a day (QID) | ORAL | 0 refills | Status: DC | PRN

## 2023-02-28 MED ORDER — DEXAMETHASONE SODIUM PHOSPHATE 10 MG/ML IJ SOLN
INTRAMUSCULAR | Status: DC | PRN
  Administered 2023-02-28: 10 mg via INTRAVENOUS

## 2023-02-28 MED ORDER — BUPIVACAINE LIPOSOME 1.3 % IJ SUSP
INTRAMUSCULAR | Status: DC | PRN
  Administered 2023-02-28: 40 mL

## 2023-02-28 MED ORDER — OXYCODONE HCL 5 MG PO TABS
5.0000 mg | ORAL_TABLET | Freq: Once | ORAL | Status: AC | PRN
Start: 2023-02-28 — End: 2023-02-28
  Administered 2023-02-28: 5 mg via ORAL

## 2023-02-28 MED ORDER — HEMOSTATIC AGENTS (NO CHARGE) OPTIME
TOPICAL | Status: DC | PRN
  Administered 2023-02-28: 1 via TOPICAL

## 2023-02-28 MED ORDER — PROPOFOL 10 MG/ML IV BOLUS
INTRAVENOUS | Status: DC | PRN
  Administered 2023-02-28: 200 mg via INTRAVENOUS

## 2023-02-28 MED ORDER — OXYCODONE HCL 5 MG/5ML PO SOLN
5.0000 mg | Freq: Once | ORAL | Status: AC | PRN
Start: 2023-02-28 — End: 2023-02-28

## 2023-02-28 MED ORDER — CHLORHEXIDINE GLUCONATE 0.12 % MT SOLN
OROMUCOSAL | Status: AC
  Filled 2023-02-28: qty 15

## 2023-02-28 MED ORDER — HYDROCODONE-ACETAMINOPHEN 5-325 MG PO TABS
1.0000 | ORAL_TABLET | Freq: Four times a day (QID) | ORAL | 0 refills | Status: DC | PRN
  Filled 2023-02-28: qty 36, 5d supply, fill #0

## 2023-02-28 MED ORDER — BUPIVACAINE LIPOSOME 1.3 % IJ SUSP
INTRAMUSCULAR | Status: AC
  Filled 2023-02-28: qty 10

## 2023-02-28 MED ORDER — ROCURONIUM BROMIDE 100 MG/10ML IV SOLN
INTRAVENOUS | Status: DC | PRN
  Administered 2023-02-28: 20 mg via INTRAVENOUS
  Administered 2023-02-28: 40 mg via INTRAVENOUS
  Administered 2023-02-28: 20 mg via INTRAVENOUS

## 2023-02-28 MED ORDER — ACETAMINOPHEN 10 MG/ML IV SOLN: 1000.0000 mg | Freq: Once | INTRAVENOUS | Status: DC | PRN

## 2023-02-28 MED ORDER — SUGAMMADEX SODIUM 200 MG/2ML IV SOLN
INTRAVENOUS | Status: DC | PRN
  Administered 2023-02-28: 200 mg via INTRAVENOUS

## 2023-02-28 MED ORDER — FENTANYL CITRATE (PF) 100 MCG/2ML IJ SOLN
INTRAMUSCULAR | Status: DC | PRN
  Administered 2023-02-28 (×2): 50 ug via INTRAVENOUS
  Administered 2023-02-28: 100 ug via INTRAVENOUS

## 2023-02-28 MED ORDER — OXYCODONE HCL 5 MG PO TABS
ORAL_TABLET | ORAL | Status: AC
  Filled 2023-02-28: qty 1

## 2023-02-28 SURGICAL SUPPLY — 47 items
APPLIER CLIP 11 MED OPEN (CLIP)
APPLIER CLIP 9.375 SM OPEN (CLIP)
BAG DECANTER FOR FLEXI CONT (MISCELLANEOUS) ×1 IMPLANT
BLADE SURG SZ11 CARB STEEL (BLADE) ×1 IMPLANT
BRUSH SCRUB EZ 4% CHG (MISCELLANEOUS) ×1 IMPLANT
CHLORAPREP W/TINT 26 (MISCELLANEOUS) ×1 IMPLANT
CLAMP SUTURE YELLOW 5 PAIRS (MISCELLANEOUS) ×1 IMPLANT
CLIP APPLIE 11 MED OPEN (CLIP) IMPLANT
CLIP APPLIE 9.375 SM OPEN (CLIP) IMPLANT
DERMABOND ADVANCED .7 DNX12 (GAUZE/BANDAGES/DRESSINGS) ×1 IMPLANT
DRESSING SURGICEL FIBRLLR 1X2 (HEMOSTASIS) ×1 IMPLANT
DRSG SURGICEL FIBRILLAR 1X2 (HEMOSTASIS) ×1
ELECT CAUTERY BLADE 6.4 (BLADE) ×1 IMPLANT
ELECT REM PT RETURN 9FT ADLT (ELECTROSURGICAL) ×1
ELECTRODE REM PT RTRN 9FT ADLT (ELECTROSURGICAL) ×1 IMPLANT
GLOVE BIO SURGEON STRL SZ7 (GLOVE) ×1 IMPLANT
GLOVE SURG SYN 8.0 (GLOVE) ×1 IMPLANT
GLOVE SURG SYN 8.0 PF PI (GLOVE) ×1 IMPLANT
GOWN STRL REUS W/ TWL LRG LVL3 (GOWN DISPOSABLE) ×2 IMPLANT
GOWN STRL REUS W/ TWL XL LVL3 (GOWN DISPOSABLE) ×1 IMPLANT
GRAFT PROPATEN STD WALL 4 7X45 (Vascular Products) ×1 IMPLANT
IV NS 500ML BAXH (IV SOLUTION) ×1 IMPLANT
KIT TURNOVER KIT A (KITS) ×1 IMPLANT
LABEL OR SOLS (LABEL) ×1 IMPLANT
LOOP VESSEL MAXI 1X406 RED (MISCELLANEOUS) ×1 IMPLANT
LOOP VESSEL MINI 0.8X406 BLUE (MISCELLANEOUS) ×2 IMPLANT
MANIFOLD NEPTUNE II (INSTRUMENTS) ×1 IMPLANT
NDL FILTER BLUNT 18X1 1/2 (NEEDLE) ×1 IMPLANT
NEEDLE FILTER BLUNT 18X1 1/2 (NEEDLE) ×1 IMPLANT
NS IRRIG 500ML POUR BTL (IV SOLUTION) ×1 IMPLANT
PACK EXTREMITY ARMC (MISCELLANEOUS) ×1 IMPLANT
PAD PREP OB/GYN DISP 24X41 (PERSONAL CARE ITEMS) ×1 IMPLANT
STOCKINETTE 48X4 2 PLY STRL (GAUZE/BANDAGES/DRESSINGS) ×1 IMPLANT
STOCKINETTE STRL 4IN 9604848 (GAUZE/BANDAGES/DRESSINGS) ×1 IMPLANT
SUT GTX CV-6 30 (SUTURE) ×2 IMPLANT
SUT MNCRL+ 5-0 UNDYED PC-3 (SUTURE) ×1 IMPLANT
SUT PROLENE 6 0 BV (SUTURE) ×2 IMPLANT
SUT SILK 2 0 SH (SUTURE) ×1 IMPLANT
SUT SILK 2-0 18XBRD TIE 12 (SUTURE) ×1 IMPLANT
SUT SILK 3-0 18XBRD TIE 12 (SUTURE) ×1 IMPLANT
SUT SILK 4-0 18XBRD TIE 12 (SUTURE) ×1 IMPLANT
SUT VIC AB 3-0 SH 27X BRD (SUTURE) ×2 IMPLANT
SYR 20ML LL LF (SYRINGE) ×1 IMPLANT
SYR 3ML LL SCALE MARK (SYRINGE) ×1 IMPLANT
TAG SUTURE CLAMP YLW 5PR (MISCELLANEOUS) ×1
TRAP FLUID SMOKE EVACUATOR (MISCELLANEOUS) ×1 IMPLANT
WATER STERILE IRR 500ML POUR (IV SOLUTION) ×1 IMPLANT

## 2023-02-28 NOTE — Op Note (Signed)
OPERATIVE NOTE   PROCEDURE: left brachial axillary arteriovenous graft placement  PRE-OPERATIVE DIAGNOSIS: End Stage Renal Disease  POST-OPERATIVE DIAGNOSIS: End Stage Renal Disease  SURGEON: [name]  ASSISTANT(S): none  ANESTHESIA: general  ESTIMATED BLOOD LOSS: <50 cc  FINDING(S): Good quality vein and artery  SPECIMEN(S):  none  INDICATIONS:   Darlene Barber is a 57 y.o. female who presents with end stage renal disease.  The patient is scheduled for left brachial axillary AV graft placement.  The patient is aware the risks include but are not limited to: bleeding, infection, steal syndrome, nerve damage, ischemic monomelic neuropathy, failure to mature, and need for additional procedures.  The patient is aware of the risks of the procedure and elects to proceed forward.  DESCRIPTION: After full informed written consent was obtained from the patient, the patient was brought back to the operating room and placed supine upon the operating table.  Prior to induction, the patient received IV antibiotics.   After obtaining adequate anesthesia, the patient was then prepped and draped in the standard fashion for a left arm access procedure.   A first assistant was required to provide a safe and appropriate environment for executing the surgery.  The assistant was integral in providing retraction, exposure, running suture providing suction and in the closing process.   A linear incision was then created along the medial border of the biceps muscle just proximal to the antecubital crease and the brachial artery which was exposed through. The brachial artery was then looped proximally and distally with Silastic Vesseloops. Side branches were controlled with 4-0 silk ties.  Attention was then turned to the exposure of the axillary vein. Linear incision was then created medial to the proximal portion of the biceps at the level of the anterior axillary crease. The axillary  vein was exposed and again looped proximally and distally with Silastic vessel loops. Associated tributaries were also controlled with Silastic Vesseloops.  The Gore tunneler was then delivered onto the field and a subcutaneous path was made from the arterial incision to the venous incision. A 4-7 tapered PTFE propatent graft by Emeline Darling was then pulled through the subcutaneous tunnel. The arterial 4 mm portion was then approximated to the brachial artery. Brachial artery was controlled proximally and distally with the Silastic Vesseloops. Arteriotomy was made with an 11 blade scalpel and extended with Potts scissors and a 6-0 Prolene stay suture was placed. End graft to side brachial artery anastomosis was then fashioned with running CV 6 suture. Flushing maneuvers were performed suture line was hemostatic and the graft was then assessed for proper position and ease of future cannulation. Heparinized saline was infused into the vein and the graft was clamped with a vascular clamp. With the graft pressurized it was approximated to the axillary vein in its native bed and then marked with a surgical marker. The vein was then delivered into the surgical field and controlled with the Silastic vessel loops. Venotomy was then made with an 11 blade scalpel and extended with Potts scissors and a 6-0 Prolene suture was used as stay suture. The the graft was then sewn to the vein in an end graft to side vein fashion using running CV 6 suture.  Flushing maneuvers were performed and the artery was allowed to forward and back bleed.  Flow was then established through the AV graft  There was good  thrill in the venous outflow, and there was 1+ palpable radial pulse.  At this point,  I irrigated out the surgical wounds.  There was no further active bleeding.  The subcutaneous tissue was reapproximated with a running stitch of 3-0 Vicryl.  The skin was then reapproximated with a running subcuticular stitch of 4-0 Vicryl.  The skin  was then cleaned, dried, and reinforced with Dermabond.    The patient tolerated this procedure well.   COMPLICATIONS: None  CONDITION: [facility]  Office: [phone]   02/28/2023, 10:04 AM

## 2023-02-28 NOTE — Interval H&P Note (Signed)
History and Physical Interval Note:  02/28/2023 7:24 AM  Darlene Barber  has presented today for surgery, with the diagnosis of CHRONIC KIDNEY DISEASE STAGE 5.  The various methods of treatment have been discussed with the patient and family. After consideration of risks, benefits and other options for treatment, the patient has consented to  Procedure(s): INSERTION OF ARTERIOVENOUS (AV) GORE-TEX GRAFT ARM (BRACHIAL AXILLARY) (Left) as a surgical intervention.  The patient's history has been reviewed, patient examined, no change in status, stable for surgery.  I have reviewed the patient's chart and labs.  Questions were answered to the patient's satisfaction.     [name]

## 2023-02-28 NOTE — Anesthesia Preprocedure Evaluation (Signed)
Anesthesia Evaluation  Patient identified by MRN, date of birth, ID band Patient awake    Reviewed: Allergy & Precautions, NPO status , [name] Chart, lab work & pertinent test results  History of Anesthesia Complications Negative for: history of anesthetic complications  Airway Mallampati: II  TM Distance: <3 FB Neck ROM: Full    Dental  (+) Upper Dentures, Edentulous Lower   Pulmonary sleep apnea , COPD,  COPD inhaler, Patient abstained from smoking.Not current smoker, former smoker   Pulmonary exam normal breath sounds clear to auscultation       Cardiovascular Exercise Tolerance: Good METShypertension, Pt. on medications (-) CAD and (-) Past MI (-) dysrhythmias  Rhythm:Regular Rate:Normal - Systolic murmurs TTE 2024 relatively unremarkable   Neuro/Psych  Headaches PSYCHIATRIC DISORDERS Anxiety Depression Bipolar Disorder    Neuromuscular disease    GI/Hepatic PUD,GERD  Medicated and Controlled,,(+)     (-) substance abuse    Endo/Other  diabetes    Renal/GU ESRFRenal diseasenegative Renal ROS     Musculoskeletal   Abdominal   Peds  Hematology   Anesthesia Other Findings Past Medical History: No date: Allergy No date: Anemia No date: Anxiety No date: Bursitis of both hips No date: CKD (chronic kidney disease) stage 4, GFR 15-29 ml/min (HCC) No date: COPD (chronic obstructive pulmonary disease) (HCC) No date: Depression No date: Diabetes mellitus without complication (HCC) No date: Frequent headaches No date: GERD (gastroesophageal reflux disease) No date: Hypertension No date: Obesity No date: Sleep apnea     Comment:  doesn't use CPAP machine broken,   Reproductive/Obstetrics                             Anesthesia Physical Anesthesia Plan  ASA: 3  Anesthesia Plan: General   Post-op Pain Management: Ofirmev IV (intra-op)*   Induction: Intravenous  PONV Risk Score  and Plan: 3 and Ondansetron, Dexamethasone and Midazolam  Airway Management Planned: Oral ETT and Video Laryngoscope Planned  Additional Equipment: None  Intra-op Plan:   Post-operative Plan: Extubation in OR  Informed Consent: I have reviewed the patients History and Physical, chart, labs and discussed the procedure including the risks, benefits and alternatives for the proposed anesthesia with the patient or authorized representative who has indicated his/her understanding and acceptance.     Dental advisory given  Plan Discussed with: CRNA and Surgeon  Anesthesia Plan Comments: (Discussed risks of anesthesia with patient, including PONV, sore throat, lip/dental/eye damage. Rare risks discussed as well, such as cardiorespiratory and neurological sequelae, and allergic reactions. Discussed the role of CRNA in patient's perioperative care. Patient understands.)       Anesthesia Quick Evaluation

## 2023-02-28 NOTE — Anesthesia Procedure Notes (Signed)
Procedure Name: Intubation Date/Time: 02/28/2023 7:44 AM  Performed by: Mohammed Kindle, CRNAPre-anesthesia Checklist: Patient identified, Emergency Drugs available, Suction available and Patient being monitored Patient Re-evaluated:Patient Re-evaluated prior to induction Oxygen Delivery Method: Circle system utilized Preoxygenation: Pre-oxygenation with 100% oxygen Induction Type: IV induction Ventilation: Mask ventilation without difficulty Laryngoscope Size: McGrath and 3 Grade View: Grade I Tube type: Oral Tube size: 6.5 mm Number of attempts: 1 Airway Equipment and Method: Stylet and Oral airway Placement Confirmation: ETT inserted through vocal cords under direct vision, positive ETCO2 and breath sounds checked- equal and bilateral Secured at: 21 cm Tube secured with: Tape Dental Injury: Teeth and Oropharynx as per pre-operative assessment

## 2023-02-28 NOTE — Anesthesia Postprocedure Evaluation (Signed)
Anesthesia Post Note  Patient: [name]  Procedure(s) Performed: INSERTION OF ARTERIOVENOUS (AV) GORE-TEX GRAFT ARM (BRACHIAL AXILLARY) (Left)  Patient location during evaluation: PACU Anesthesia Type: General Level of consciousness: awake and alert Pain management: pain level controlled Vital Signs Assessment: post-procedure vital signs reviewed and stable Respiratory status: spontaneous breathing, nonlabored ventilation, respiratory function stable and patient connected to nasal cannula oxygen Cardiovascular status: blood pressure returned to baseline and stable Postop Assessment: no apparent nausea or vomiting Anesthetic complications: no   No notable events documented.   Last Vitals:  Vitals:   02/28/23 1045 02/28/23 1059  BP: (!) 143/88 (!) 142/78  Pulse: 71 84  Resp: 13 16  Temp: (!) 36.1 C 36.6 C  SpO2: 97% 97%    Last Pain:  Vitals:   02/28/23 1059  TempSrc: Temporal  PainSc: 4                  Corinda Gubler

## 2023-02-28 NOTE — Transfer of Care (Signed)
Immediate Anesthesia Transfer of Care Note  Patient: Darlene Barber  Procedure(s) Performed: INSERTION OF ARTERIOVENOUS (AV) GORE-TEX GRAFT ARM (BRACHIAL AXILLARY) (Left)  Patient Location: PACU  Anesthesia Type:General  Level of Consciousness: awake, drowsy, and patient cooperative  Airway & Oxygen Therapy: Patient Spontanous Breathing and Patient connected to face mask oxygen  Post-op Assessment: Report given to RN and Post -op Vital signs reviewed and stable  Post vital signs: Reviewed and stable  Last Vitals:  Vitals Value Taken Time  BP 139/76 02/28/23 1000  Temp    Pulse 74 02/28/23 1002  Resp 22 02/28/23 1002  SpO2 94 % 02/28/23 1002  Vitals shown include unfiled device data.  Last Pain:  Vitals:   02/28/23 0636  TempSrc: Oral  PainSc: 0-No pain         Complications: No notable events documented.

## 2023-02-28 NOTE — Progress Notes (Signed)
Bruit and thrill present 

## 2023-03-03 ENCOUNTER — Telehealth (INDEPENDENT_AMBULATORY_CARE_PROVIDER_SITE_OTHER): Payer: Self-pay

## 2023-03-03 ENCOUNTER — Encounter: Payer: Self-pay | Admitting: Vascular Surgery

## 2023-03-03 ENCOUNTER — Other Ambulatory Visit (INDEPENDENT_AMBULATORY_CARE_PROVIDER_SITE_OTHER): Payer: Self-pay | Admitting: Nurse Practitioner

## 2023-03-03 ENCOUNTER — Other Ambulatory Visit: Payer: Self-pay

## 2023-03-03 NOTE — Telephone Encounter (Signed)
Patient had INSERTION OF ARTERIOVENOUS (AV) GORE-TEX GRAFT ARM (BRACHIAL AXILLARY) placed 02/28/23 with Dr. Gilda Crease. She stated it hurts to move the upper part of her arm, and the incision feels raw and the top of the incision is hard. She stated she is taking Ibuprofen but its not taking the pain away. She would like something stronger to take.   Please advise

## 2023-03-03 NOTE — Telephone Encounter (Signed)
She was given 5 days worth of hydrocodone at discharge, that should suffice

## 2023-03-03 NOTE — Telephone Encounter (Signed)
Patient stated that the hydrocodone isn't working. She has been taking 2 every six hours along with Ibuprofen and its still not helping the pain. She feel she need something stronger. However, she is using ice and its helping the swelling.   Please advise

## 2023-03-04 ENCOUNTER — Other Ambulatory Visit: Payer: Self-pay

## 2023-03-04 ENCOUNTER — Other Ambulatory Visit (INDEPENDENT_AMBULATORY_CARE_PROVIDER_SITE_OTHER): Payer: Self-pay | Admitting: Nurse Practitioner

## 2023-03-04 MED ORDER — OXYCODONE-ACETAMINOPHEN 5-325 MG PO TABS
1.0000 | ORAL_TABLET | ORAL | 0 refills | Status: AC | PRN
Start: 2023-03-04 — End: ?
  Filled 2023-03-04: qty 30, 3d supply, fill #0

## 2023-03-04 NOTE — Telephone Encounter (Signed)
I have sent in percocet

## 2023-03-07 NOTE — Telephone Encounter (Signed)
Can you get this patient in for a HDA with a steal study.

## 2023-03-07 NOTE — Telephone Encounter (Signed)
The numbness on pinky and ring finger are not uncommon.  I know that she has numbness and tinglining through out her body in looking at her neurology visits, some of that can be related to that.  She should have an HDA with steal study

## 2023-03-07 NOTE — Telephone Encounter (Signed)
From the elbow down to the top part of hand she is having be stings and pins, also numbness on pinky and ring finger.

## 2023-03-10 ENCOUNTER — Telehealth (INDEPENDENT_AMBULATORY_CARE_PROVIDER_SITE_OTHER): Payer: No Typology Code available for payment source | Admitting: Psychiatry

## 2023-03-10 ENCOUNTER — Other Ambulatory Visit: Payer: Self-pay

## 2023-03-10 ENCOUNTER — Encounter: Payer: Self-pay | Admitting: Psychiatry

## 2023-03-10 DIAGNOSIS — F250 Schizoaffective disorder, bipolar type: Secondary | ICD-10-CM

## 2023-03-10 DIAGNOSIS — R45851 Suicidal ideations: Secondary | ICD-10-CM | POA: Diagnosis not present

## 2023-03-10 DIAGNOSIS — G4700 Insomnia, unspecified: Secondary | ICD-10-CM | POA: Diagnosis not present

## 2023-03-10 DIAGNOSIS — F1210 Cannabis abuse, uncomplicated: Secondary | ICD-10-CM

## 2023-03-10 DIAGNOSIS — F4310 Post-traumatic stress disorder, unspecified: Secondary | ICD-10-CM

## 2023-03-10 MED ORDER — SERTRALINE HCL 100 MG PO TABS
100.0000 mg | ORAL_TABLET | Freq: Every day | ORAL | 0 refills | Status: DC
  Filled 2023-04-25: qty 90, 90d supply, fill #0

## 2023-03-10 MED ORDER — EMGALITY 120 MG/ML ~~LOC~~ SOAJ
120.0000 mg | SUBCUTANEOUS | 3 refills | Status: DC
  Filled 2023-03-10: qty 1, 30d supply, fill #0
  Filled 2023-04-07: qty 1, 30d supply, fill #1
  Filled 2023-05-12: qty 1, 30d supply, fill #2
  Filled 2023-07-10: qty 1, 30d supply, fill #3

## 2023-03-11 ENCOUNTER — Other Ambulatory Visit: Payer: Self-pay

## 2023-03-13 ENCOUNTER — Other Ambulatory Visit (INDEPENDENT_AMBULATORY_CARE_PROVIDER_SITE_OTHER): Payer: Self-pay | Admitting: Nurse Practitioner

## 2023-03-13 DIAGNOSIS — N186 End stage renal disease: Secondary | ICD-10-CM

## 2023-03-13 DIAGNOSIS — R200 Anesthesia of skin: Secondary | ICD-10-CM

## 2023-03-14 ENCOUNTER — Other Ambulatory Visit: Payer: Self-pay

## 2023-03-19 ENCOUNTER — Ambulatory Visit (INDEPENDENT_AMBULATORY_CARE_PROVIDER_SITE_OTHER): Payer: Medicaid Other

## 2023-03-19 ENCOUNTER — Encounter (INDEPENDENT_AMBULATORY_CARE_PROVIDER_SITE_OTHER): Payer: Self-pay

## 2023-03-24 ENCOUNTER — Other Ambulatory Visit: Payer: Self-pay

## 2023-03-24 MED ORDER — ALBUTEROL SULFATE HFA 108 (90 BASE) MCG/ACT IN AERS
2.0000 | INHALATION_SPRAY | RESPIRATORY_TRACT | 1 refills | Status: DC | PRN
  Filled 2023-03-24: qty 18, 25d supply, fill #0
  Filled 2023-05-08: qty 18, 25d supply, fill #1

## 2023-03-27 ENCOUNTER — Telehealth (INDEPENDENT_AMBULATORY_CARE_PROVIDER_SITE_OTHER): Payer: Self-pay

## 2023-03-27 NOTE — Telephone Encounter (Signed)
Patient left a message stating she has rash, itching, some pain around the left av graft placement, under arm,wrist, and hand for the past 2 weeks. The patient reach out to PCP and was advise to contact our office. Patient insertion av gore-tex was on 02/28/2023. Please Advise

## 2023-03-27 NOTE — Telephone Encounter (Signed)
Patient notified with medical recommendations and verbalized understanding. Please contact patient to schedule appointment

## 2023-03-27 NOTE — Telephone Encounter (Signed)
She was supposed to have an office visit on 1/08 which she no showed, so we would have been able to check it then.  She needs to seen with an HDA.  She should take claritin twice a day and  hydrocortisone cream on the rash

## 2023-03-28 ENCOUNTER — Other Ambulatory Visit: Payer: Self-pay

## 2023-04-07 ENCOUNTER — Other Ambulatory Visit: Payer: Self-pay

## 2023-04-10 ENCOUNTER — Other Ambulatory Visit: Payer: Self-pay

## 2023-04-10 MED ORDER — CLOTRIMAZOLE-BETAMETHASONE 1-0.05 % EX CREA
TOPICAL_CREAM | Freq: Two times a day (BID) | CUTANEOUS | 1 refills | Status: AC
  Filled 2023-04-10: qty 45, 30d supply, fill #0

## 2023-04-11 ENCOUNTER — Other Ambulatory Visit: Payer: Self-pay

## 2023-04-11 ENCOUNTER — Ambulatory Visit (INDEPENDENT_AMBULATORY_CARE_PROVIDER_SITE_OTHER): Payer: Medicaid Other

## 2023-04-11 ENCOUNTER — Encounter (INDEPENDENT_AMBULATORY_CARE_PROVIDER_SITE_OTHER): Payer: Self-pay | Admitting: Nurse Practitioner

## 2023-04-11 ENCOUNTER — Ambulatory Visit (INDEPENDENT_AMBULATORY_CARE_PROVIDER_SITE_OTHER): Payer: Medicaid Other | Admitting: Nurse Practitioner

## 2023-04-11 VITALS — BP 135/85 | HR 79 | Resp 16 | Wt 187.0 lb

## 2023-04-11 DIAGNOSIS — R200 Anesthesia of skin: Secondary | ICD-10-CM

## 2023-04-11 DIAGNOSIS — E118 Type 2 diabetes mellitus with unspecified complications: Secondary | ICD-10-CM

## 2023-04-11 DIAGNOSIS — N186 End stage renal disease: Secondary | ICD-10-CM

## 2023-04-12 ENCOUNTER — Encounter (INDEPENDENT_AMBULATORY_CARE_PROVIDER_SITE_OTHER): Payer: Self-pay | Admitting: Nurse Practitioner

## 2023-04-12 NOTE — Progress Notes (Unsigned)
Subjective:    Patient ID: Darlene Barber, female    DOB: Jun 18, 1965, 58 y.o.   MRN: [id number] Chief Complaint  Patient presents with  . Routine Post Op    R/o Steal. Insertion of AV gore-tex graft on 02/28/23    HPI  Review of Systems     Objective:   Physical Exam  BP 135/85   Pulse 79   Resp 16   Wt 187 lb (84.8 kg)   BMI 39.08 kg/m   Past Medical History:  Diagnosis Date  . Allergy   . Anemia   . Anxiety   . Bursitis of both hips   . CKD (chronic kidney disease) stage 4, GFR 15-29 ml/min (HCC)   . COPD (chronic obstructive pulmonary disease) (HCC)   . Depression   . Diabetes mellitus without complication (HCC)   . Frequent headaches   . GERD (gastroesophageal reflux disease)   . Hypertension   . Obesity   . Sleep apnea    doesn't use CPAP machine broken,     Social History   Socioeconomic History  . Marital status: Divorced    Spouse name: Not on file  . Number of children: 3  . Years of education: [employer]  . Highest education level: GED or equivalent  Occupational History  . Occupation: Unemployed  Tobacco Use  . Smoking status: Former    Current packs/day: 1.00    Average packs/day: 1 pack/day for 30.0 years (30.0 ttl pk-yrs)    Types: Cigarettes, Cigars    Passive exposure: Past  . Smokeless tobacco: Never  Vaping Use  . Vaping status: Some Days  . Substances: Nicotine, Flavoring  Substance and Sexual Activity  . Alcohol use: Not Currently    Comment: occ once per year  . Drug use: Yes    Types: Marijuana    Comment: Current use  . Sexual activity: Not Currently  Other Topics Concern  . Not on file  Social History Narrative  . Not on file   Social Drivers of Health   Financial Resource Strain: Not on file  Food Insecurity: No Food Insecurity (07/31/2022)   Hunger Vital Sign   . Worried About Programme researcher, broadcasting/film/video in the Last Year: Never true   . Ran Out of Food in the Last Year: Never true  Transportation Needs: No  Transportation Needs (07/31/2022)   PRAPARE - Transportation   . Lack of Transportation (Medical): No   . Lack of Transportation (Non-Medical): No  Physical Activity: Not on file  Stress: Not on file  Social Connections: Not on file  Intimate Partner Violence: Not At Risk (07/31/2022)   Humiliation, Afraid, Rape, and Kick questionnaire   . Fear of Current or Ex-Partner: No   . Emotionally Abused: No   . Physically Abused: No   . Sexually Abused: No    Past Surgical History:  Procedure Laterality Date  . AV FISTULA PLACEMENT Left 02/28/2023   Procedure: INSERTION OF ARTERIOVENOUS (AV) GORE-TEX GRAFT ARM (BRACHIAL AXILLARY);  Surgeon: Renford Dills, MD;  Location: [facility] ORS;  Service: Vascular;  Laterality: Left;  . BIOPSY  03/30/2022   Procedure: BIOPSY;  Surgeon: Benancio Deeds, MD;  Location: [facility] ENDOSCOPY;  Service: Gastroenterology;;  . CESAREAN SECTION     x2  . CHOLECYSTECTOMY    . COLONOSCOPY WITH PROPOFOL N/A 11/20/2017   Procedure: COLONOSCOPY WITH PROPOFOL;  Surgeon: Toney Reil, MD;  Location: [facility] ENDOSCOPY;  Service: Gastroenterology;  Laterality: N/A;  .  COLONOSCOPY WITH PROPOFOL N/A 03/30/2022   Procedure: COLONOSCOPY WITH PROPOFOL;  Surgeon: Benancio Deeds, MD;  Location: [facility] ENDOSCOPY;  Service: Gastroenterology;  Laterality: N/A;  . COLONOSCOPY WITH PROPOFOL N/A 08/02/2022   Procedure: COLONOSCOPY WITH PROPOFOL;  Surgeon: Toney Reil, MD;  Location: [facility] ENDOSCOPY;  Service: Gastroenterology;  Laterality: N/A;  . ESOPHAGOGASTRODUODENOSCOPY (EGD) WITH PROPOFOL N/A 03/30/2022   Procedure: ESOPHAGOGASTRODUODENOSCOPY (EGD) WITH PROPOFOL;  Surgeon: Benancio Deeds, MD;  Location: [facility] ENDOSCOPY;  Service: Gastroenterology;  Laterality: N/A;  . HYSTERECTOMY ABDOMINAL WITH SALPINGECTOMY  1998  . HYSTEROSCOPY    . LAPAROSCOPIC SUBTOTAL COLECTOMY Right 08/03/2022   Procedure: HAND ASSISTED LAPAROSCOPIC SUBTOTAL COLECTOMY;  Surgeon: Leafy Ro,  MD;  Location: [facility] ORS;  Service: General;  Laterality: Right;    Family History  Problem Relation Age of Onset  . Hodgkin's lymphoma Mother   . Diabetes Maternal Aunt   . Diabetes Maternal Uncle   . Alzheimer's disease Maternal Grandfather   . Schizophrenia Maternal Grandmother   . Heart failure Maternal Grandmother   . Emphysema Paternal Grandmother   . Cancer Other   . Breast cancer Neg Hx     Allergies  Allergen Reactions  . Other Anaphylaxis, Itching, Swelling and Other (See Comments)    Black pepper  . Black Pepper-Turmeric   . Latex Rash  . Silicone Rash  . Tape Rash and Other (See Comments)    Paper tape is ok to use.       Latest Ref Rng & Units 02/28/2023    6:45 AM 08/13/2022    3:49 PM 08/06/2022    4:52 AM  CBC  WBC 4.0 - 10.5 K/uL  10.3  9.9   Hemoglobin 12.0 - 15.0 g/dL 9.2  9.4  8.7   Hematocrit 36.0 - 46.0 % 27.0  29.5  26.5   Platelets 150 - 400 K/uL  676  344       CMP     Component Value Date/Time   NA 139 02/28/2023 0645   NA 137 04/20/2012 1725   K 4.2 02/28/2023 0645   K 4.2 04/20/2012 1725   CL 107 02/28/2023 0645   CL 106 04/20/2012 1725   CO2 23 08/13/2022 1549   CO2 24 04/20/2012 1725   GLUCOSE 103 (H) 02/28/2023 0645   GLUCOSE 97 04/20/2012 1725   BUN 29 (H) 02/28/2023 0645   BUN 14 04/20/2012 1725   CREATININE 2.60 (H) 02/28/2023 0645   CREATININE 0.83 09/30/2019 1057   CALCIUM 8.6 (L) 08/13/2022 1549   CALCIUM 9.2 04/20/2012 1725   PROT 6.3 (L) 08/13/2022 1549   PROT 7.9 04/20/2012 1725   ALBUMIN 2.4 (L) 08/13/2022 1549   ALBUMIN 4.0 04/20/2012 1725   AST 11 (L) 08/13/2022 1549   AST 26 04/20/2012 1725   ALT 9 08/13/2022 1549   ALT 39 04/20/2012 1725   ALKPHOS 84 08/13/2022 1549   ALKPHOS 123 04/20/2012 1725   BILITOT 0.4 08/13/2022 1549   BILITOT 0.5 04/20/2012 1725   GFRNONAA 26 (L) 08/13/2022 1549   GFRNONAA 81 09/30/2019 1057     No results found.     Assessment & Plan:   1. ESRD (end stage renal  disease) (HCC) (Primary) ***  2. Hand numbness ***  3. Controlled type 2 diabetes mellitus with complication, without long-term current use of insulin (HCC) ***   Current Outpatient Medications on File Prior to Visit  Medication Sig Dispense Refill  . acetaminophen (TYLENOL) 650 MG CR tablet  Take 1,300 mg by mouth every 8 (eight) hours as needed for pain.    [name] acyclovir (ZOVIRAX) 400 MG tablet Take 1 tablet (400 mg total) by mouth every 8 (eight) hours. (Patient taking differently: Take 400 mg by mouth in the morning and at bedtime.) 180 tablet 3  . albuterol (VENTOLIN HFA) 108 (90 Base) MCG/ACT inhaler Inhale 2 puffs into the lungs every 4 (four) hours as needed. 18 g 1  . amLODipine (NORVASC) 10 MG tablet Take 1 tablet (10 mg total) by mouth daily. 90 tablet 1  . Calcium Carbonate Antacid (ANTACID SOFT CHEWS PO) Take 1 tablet by mouth every 6 (six) hours as needed (for indigesation- CHEW).    . clotrimazole-betamethasone (LOTRISONE) cream Apply topically to affected area 2 (two) times daily 45 g 1  . fluticasone (FLONASE) 50 MCG/ACT nasal spray Place 2 sprays into both nostrils daily. (Patient taking differently: Place 2 sprays into both nostrils 2 (two) times daily.) 16 g 5  . Fremanezumab-vfrm (AJOVY) 225 MG/1.5ML SOAJ Inject 225 mg into the skin every 30 (thirty) days. 1.5 mL 5  . Galcanezumab-gnlm (EMGALITY) 120 MG/ML SOAJ Inject 1 mL (120 mg) into the skin every 30 (thirty) days. 1 mL 3  . hydrALAZINE (APRESOLINE) 25 MG tablet Take 1 tablet (25 mg total) by mouth in the morning and 1 tablet (25 mg total) in the evening and 1 tablet (25 mg total) before bedtime. 90 tablet 11  . ibuprofen (ADVIL) 200 MG tablet Take 400 mg by mouth every 6 (six) hours as needed for moderate pain (pain score 4-6).    [name] loratadine (CLARITIN) 10 MG tablet Take 1 tablet (10 mg total) by mouth once daily 30 tablet 11  . Lurasidone HCl 60 MG TABS Take 1 tablet (60 mg total) by mouth daily. 30 tablet 2  .  metoprolol tartrate (LOPRESSOR) 50 MG tablet Take 1 tablet (50 mg total) by mouth every morning AND 1 tablet (50 mg total) every evening. 60 tablet 11  . nortriptyline (PAMELOR) 25 MG capsule Take 1 capsule (25 mg total) by mouth in the morning AND 2 capsules (50 mg total) at bedtime. 270 capsule 1  . omeprazole (PRILOSEC) 40 MG capsule Take 1 capsule (40 mg total) by mouth 2 (two) times daily as needed. (Patient taking differently: Take 40 mg by mouth in the morning and at bedtime.) 180 capsule 1  . ondansetron (ZOFRAN-ODT) 4 MG disintegrating tablet Dissolve 1 tablet (4 mg total) by mouth every 8 (eight) hours as needed. 20 tablet 3  . oxyCODONE-acetaminophen (PERCOCET/ROXICET) 5-325 MG tablet Take 1-2 tablets by mouth every 4 (four) hours as needed for severe pain (pain score 7-10). 30 tablet 0  . pregabalin (LYRICA) 25 MG capsule Take 1 capsule (25 mg total) by mouth 3 (three) times daily. 90 capsule 3  . [START ON 04/23/2023] sertraline (ZOLOFT) 100 MG tablet Take 1 tablet (100 mg total) by mouth at bedtime. 90 tablet 0  . sodium bicarbonate 650 MG tablet Take 1 tablet (650 mg total) by mouth 3 (three) times daily in the morning, in the evening and before bedtime. 90 tablet 3  . sucralfate (CARAFATE) 1 g tablet Take 1 tablet (1 g total) by mouth 2 (two) times daily before meals. 180 tablet 2  . terbinafine (LAMISIL) 250 MG tablet Take 250 mg by mouth daily.    [name] tiotropium (SPIRIVA) 18 MCG inhalation capsule Place 1 capsule (18 mcg total) into inhaler and inhale daily. 30 capsule 5  .  traZODone (DESYREL) 150 MG tablet Take 1 tablet (150 mg total) by mouth at bedtime. 30 tablet 5  . Albuterol Sulfate (PROAIR RESPICLICK) 108 (90 Base) MCG/ACT AEPB Inhale 2 puffs into the lungs 4 (four) times daily. (Patient not taking: Reported on 02/18/2023) 1 each 1  . Galcanezumab-gnlm (EMGALITY) 120 MG/ML SOAJ Inject 120 mg into the skin every 30 (thirty) days. (Patient not taking: Reported on 02/18/2023) 1 mL 5   . metoprolol tartrate (LOPRESSOR) 25 MG tablet Take 1 tablet (25 mg total) by mouth 2 (two) times daily (Patient not taking: Reported on 02/18/2023) 180 tablet 0  . traZODone (DESYREL) 50 MG tablet Take 1 tablet (50 mg total) by mouth at bedtime as needed for sleep. (Patient not taking: Reported on 02/18/2023) 30 tablet 11   Current Facility-Administered Medications on File Prior to Visit  Medication Dose Route Frequency Provider Last Rate Last Admin  . lidocaine HCl (PF) (XYLOCAINE) 2 % injection 50 mL  50 mL Other Once Alfredo Martinez, MD        There are no Patient Instructions on file for this visit. No follow-ups on file.   Georgiana Spinner, NP

## 2023-04-15 ENCOUNTER — Other Ambulatory Visit: Payer: Self-pay

## 2023-04-18 ENCOUNTER — Other Ambulatory Visit: Payer: Self-pay

## 2023-04-22 ENCOUNTER — Other Ambulatory Visit: Payer: Self-pay

## 2023-04-22 MED ORDER — FT NIGHTTIME COLD & FLU 15-6.25-325 MG/15ML PO LIQD
30.0000 mL | Freq: Every day | ORAL | 0 refills | Status: AC
  Filled 2023-04-22: qty 355, 11d supply, fill #0

## 2023-04-25 ENCOUNTER — Other Ambulatory Visit: Payer: Self-pay

## 2023-04-25 ENCOUNTER — Other Ambulatory Visit (HOSPITAL_COMMUNITY): Payer: Self-pay

## 2023-04-25 MED ORDER — NORTRIPTYLINE HCL 25 MG PO CAPS
ORAL_CAPSULE | ORAL | 1 refills | Status: DC
  Filled 2023-04-25 (×2): qty 270, 90d supply, fill #0
  Filled 2023-04-25: qty 90, 30d supply, fill #0
  Filled 2023-05-08: qty 270, 90d supply, fill #0
  Filled 2023-05-09: qty 90, 30d supply, fill #0
  Filled 2023-05-15 – 2023-06-02 (×2): qty 270, 90d supply, fill #0
  Filled 2023-06-25: qty 270, 90d supply, fill #1
  Filled 2023-06-26: qty 90, 30d supply, fill #1
  Filled 2023-07-01: qty 270, 90d supply, fill #1
  Filled 2023-09-08: qty 90, 30d supply, fill #1
  Filled 2023-10-07: qty 90, 30d supply, fill #2
  Filled 2023-11-05: qty 90, 30d supply, fill #3
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
## 2023-04-29 ENCOUNTER — Other Ambulatory Visit (HOSPITAL_COMMUNITY): Payer: Self-pay

## 2023-04-29 MED ORDER — SODIUM BICARBONATE 650 MG PO TABS
650.0000 mg | ORAL_TABLET | Freq: Three times a day (TID) | ORAL | 3 refills | Status: DC
  Filled 2023-04-29 – 2023-05-09 (×2): qty 90, 30d supply, fill #0
  Filled 2023-05-15 – 2023-06-02 (×2): qty 90, 30d supply, fill #1
  Filled 2023-06-25 – 2023-07-01 (×3): qty 90, 30d supply, fill #2
  Filled 2023-07-21 – 2023-08-15 (×5): qty 90, 30d supply, fill #3

## 2023-04-30 ENCOUNTER — Other Ambulatory Visit (HOSPITAL_COMMUNITY): Payer: Self-pay

## 2023-04-30 ENCOUNTER — Other Ambulatory Visit: Payer: Self-pay

## 2023-05-01 ENCOUNTER — Telehealth (INDEPENDENT_AMBULATORY_CARE_PROVIDER_SITE_OTHER): Payer: No Typology Code available for payment source | Admitting: Psychiatry

## 2023-05-01 ENCOUNTER — Encounter: Payer: Self-pay | Admitting: Psychiatry

## 2023-05-01 ENCOUNTER — Other Ambulatory Visit: Payer: Self-pay

## 2023-05-01 DIAGNOSIS — F250 Schizoaffective disorder, bipolar type: Secondary | ICD-10-CM | POA: Diagnosis not present

## 2023-05-01 DIAGNOSIS — F4310 Post-traumatic stress disorder, unspecified: Secondary | ICD-10-CM

## 2023-05-01 MED ORDER — LURASIDONE HCL 60 MG PO TABS
60.0000 mg | ORAL_TABLET | Freq: Every day | ORAL | 0 refills | Status: DC
  Filled 2023-05-01 – 2023-05-05 (×2): qty 90, 90d supply, fill #0
  Filled 2023-05-06 – 2023-05-09 (×3): qty 30, 30d supply, fill #0
  Filled 2023-05-15 – 2023-06-02 (×2): qty 30, 30d supply, fill #1
  Filled 2023-06-25 – 2023-07-01 (×3): qty 30, 30d supply, fill #2
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
## 2023-05-01 NOTE — Patient Instructions (Signed)
Restart latuda 60 mg daily  Continue sertraline 100 mg daily  Next appointment: 4/17 at 3 30

## 2023-05-02 ENCOUNTER — Other Ambulatory Visit: Payer: Self-pay

## 2023-05-05 ENCOUNTER — Other Ambulatory Visit: Payer: Self-pay

## 2023-05-05 ENCOUNTER — Ambulatory Visit: Payer: Self-pay | Admitting: Psychiatry

## 2023-05-06 ENCOUNTER — Other Ambulatory Visit: Payer: Self-pay

## 2023-05-06 ENCOUNTER — Other Ambulatory Visit (HOSPITAL_COMMUNITY): Payer: Self-pay

## 2023-05-06 ENCOUNTER — Other Ambulatory Visit: Payer: Self-pay | Admitting: Family Medicine

## 2023-05-06 DIAGNOSIS — I10 Essential (primary) hypertension: Secondary | ICD-10-CM

## 2023-05-07 ENCOUNTER — Other Ambulatory Visit: Payer: Self-pay

## 2023-05-07 NOTE — Telephone Encounter (Signed)
 Patient no longer under prescriber's care Requested Prescriptions  Pending Prescriptions Disp Refills   losartan (COZAAR) 50 MG tablet 90 tablet 1    Sig: Take 1 tablet (50 mg total) by mouth daily.     Cardiovascular:  Angiotensin Receptor Blockers Failed - 05/07/2023  1:23 PM      Failed - Cr in normal range and within 180 days    Creat  Date Value Ref Range Status  09/30/2019 0.83 0.50 - 1.05 mg/dL Final    Comment:    For patients >58 years of age, the reference limit for Creatinine is approximately 13% higher for people identified as African-American. .    Creatinine, Ser  Date Value Ref Range Status  02/28/2023 2.60 (H) 0.44 - 1.00 mg/dL Final   Creatinine, Urine  Date Value Ref Range Status  03/21/2022 24 mg/dL Final    Comment:    Performed at [facility], [address], [place], [place] [address]         Failed - Valid encounter within last 6 months    Recent Outpatient Visits           1 year ago Gastroesophageal reflux disease without esophagitis   [place] [facility] [place], Netta Neat, DO   2 years ago Essential hypertension   [place] [facility] Smitty Cords, DO   2 years ago Primary osteoarthritis involving multiple joints   [place] [facility] [place], Netta Neat, DO   2 years ago Controlled type 2 diabetes mellitus with complication, without long-term current use of insulin [facility])   [place] [facility] [place], Netta Neat, DO   3 years ago Psychophysiological insomnia   [place] [facility] Smitty Cords, DO              Passed - K in normal range and within 180 days    Potassium  Date Value Ref Range Status  02/28/2023 4.2 3.5 - 5.1 mmol/L Final  04/20/2012 4.2 3.5 - 5.1 mmol/L Final         Passed - Patient is not pregnant      Passed - Last BP in normal range    BP Readings from Last  1 Encounters:  04/11/23 135/85

## 2023-05-08 ENCOUNTER — Other Ambulatory Visit: Payer: Self-pay

## 2023-05-08 ENCOUNTER — Other Ambulatory Visit (HOSPITAL_COMMUNITY): Payer: Self-pay

## 2023-05-08 MED ORDER — METOPROLOL TARTRATE 50 MG PO TABS
50.0000 mg | ORAL_TABLET | Freq: Two times a day (BID) | ORAL | 11 refills | Status: AC
  Filled 2023-05-08 – 2023-05-09 (×2): qty 60, 30d supply, fill #0
  Filled 2023-05-15 – 2023-06-02 (×2): qty 60, 30d supply, fill #1
  Filled 2023-06-25 – 2023-07-01 (×3): qty 60, 30d supply, fill #2
  Filled 2023-07-21 – 2023-08-15 (×5): qty 60, 30d supply, fill #3
  Filled 2023-09-08: qty 60, 30d supply, fill #4
  Filled 2023-10-07: qty 60, 30d supply, fill #5
  Filled 2023-11-05: qty 60, 30d supply, fill #6
  Filled 2023-12-09 – 2023-12-15 (×2): qty 60, 30d supply, fill #7
  Filled 2024-01-08 – 2024-01-15 (×3): qty 60, 30d supply, fill #8
  Filled 2024-01-29 – 2024-02-09 (×2): qty 60, 30d supply, fill #9
  Filled 2024-03-12: qty 60, 30d supply, fill #10
  Filled 2024-04-12: qty 60, 30d supply, fill #11

## 2023-05-09 ENCOUNTER — Other Ambulatory Visit: Payer: Self-pay

## 2023-05-12 ENCOUNTER — Other Ambulatory Visit: Payer: Self-pay

## 2023-05-14 ENCOUNTER — Other Ambulatory Visit: Payer: Self-pay

## 2023-05-15 ENCOUNTER — Other Ambulatory Visit: Payer: Self-pay

## 2023-05-20 ENCOUNTER — Other Ambulatory Visit (HOSPITAL_COMMUNITY): Payer: Self-pay

## 2023-05-20 ENCOUNTER — Other Ambulatory Visit: Payer: Self-pay

## 2023-05-21 ENCOUNTER — Other Ambulatory Visit: Payer: Self-pay

## 2023-05-21 ENCOUNTER — Other Ambulatory Visit (HOSPITAL_COMMUNITY): Payer: Self-pay

## 2023-05-23 ENCOUNTER — Other Ambulatory Visit (HOSPITAL_COMMUNITY): Payer: Self-pay

## 2023-05-23 ENCOUNTER — Other Ambulatory Visit: Payer: Self-pay

## 2023-05-27 ENCOUNTER — Other Ambulatory Visit: Payer: Self-pay

## 2023-05-27 MED ORDER — AMLODIPINE BESYLATE 10 MG PO TABS
10.0000 mg | ORAL_TABLET | Freq: Every day | ORAL | 1 refills | Status: DC
  Filled 2023-05-27: qty 90, 90d supply, fill #0
  Filled 2023-06-17: qty 30, 30d supply, fill #0
  Filled 2023-06-17: qty 90, 90d supply, fill #0
  Filled 2023-07-15: qty 30, 30d supply, fill #1
  Filled 2023-07-21 – 2023-08-15 (×5): qty 30, 30d supply, fill #2
  Filled 2023-09-08: qty 30, 30d supply, fill #3
  Filled 2023-10-07: qty 30, 30d supply, fill #4

## 2023-06-02 ENCOUNTER — Other Ambulatory Visit: Payer: Self-pay

## 2023-06-02 ENCOUNTER — Other Ambulatory Visit (HOSPITAL_COMMUNITY): Payer: Self-pay

## 2023-06-02 DIAGNOSIS — N2581 Secondary hyperparathyroidism of renal origin: Secondary | ICD-10-CM | POA: Insufficient documentation

## 2023-06-02 MED ORDER — TORSEMIDE 20 MG PO TABS
20.0000 mg | ORAL_TABLET | Freq: Every day | ORAL | 11 refills | Status: AC
  Filled 2023-06-02: qty 30, 30d supply, fill #0
  Filled 2023-06-25 – 2023-07-01 (×3): qty 30, 30d supply, fill #1
  Filled 2023-07-21 – 2023-08-15 (×5): qty 30, 30d supply, fill #2
  Filled 2023-09-08: qty 30, 30d supply, fill #3
  Filled 2023-10-07: qty 30, 30d supply, fill #4
  Filled 2023-11-05: qty 30, 30d supply, fill #5
  Filled 2023-12-09 – 2023-12-15 (×2): qty 30, 30d supply, fill #6
  Filled 2024-01-08 – 2024-01-15 (×3): qty 30, 30d supply, fill #7
  Filled 2024-01-29 – 2024-02-09 (×2): qty 30, 30d supply, fill #8
  Filled 2024-03-12: qty 30, 30d supply, fill #9
  Filled 2024-04-12: qty 30, 30d supply, fill #10

## 2023-06-02 MED ORDER — CALCITRIOL 0.25 MCG PO CAPS
0.2500 ug | ORAL_CAPSULE | Freq: Every day | ORAL | 11 refills | Status: AC
  Filled 2023-06-02: qty 30, 30d supply, fill #0
  Filled 2023-06-25 – 2023-07-01 (×3): qty 30, 30d supply, fill #1
  Filled 2023-07-21 – 2023-08-15 (×5): qty 30, 30d supply, fill #2
  Filled 2023-09-08: qty 30, 30d supply, fill #3
  Filled 2023-10-07: qty 30, 30d supply, fill #4
  Filled 2023-11-05: qty 30, 30d supply, fill #5
  Filled 2023-12-09 – 2023-12-15 (×2): qty 30, 30d supply, fill #6
  Filled 2024-01-08 – 2024-01-15 (×3): qty 30, 30d supply, fill #7
  Filled 2024-01-29 – 2024-02-09 (×2): qty 30, 30d supply, fill #8
  Filled 2024-03-12: qty 30, 30d supply, fill #9
  Filled 2024-04-12: qty 30, 30d supply, fill #10

## 2023-06-02 MED ORDER — ONDANSETRON 8 MG PO TBDP
8.0000 mg | ORAL_TABLET | Freq: Three times a day (TID) | ORAL | 2 refills | Status: AC | PRN
  Filled 2023-06-02: qty 40, 14d supply, fill #0
  Filled 2023-08-21: qty 40, 14d supply, fill #1
  Filled 2023-12-18: qty 40, 14d supply, fill #2

## 2023-06-03 ENCOUNTER — Other Ambulatory Visit: Payer: Self-pay

## 2023-06-03 ENCOUNTER — Other Ambulatory Visit (HOSPITAL_COMMUNITY): Payer: Self-pay

## 2023-06-04 ENCOUNTER — Other Ambulatory Visit (HOSPITAL_COMMUNITY): Payer: Self-pay

## 2023-06-04 ENCOUNTER — Other Ambulatory Visit: Payer: Self-pay

## 2023-06-04 MED ORDER — DOXYCYCLINE HYCLATE 100 MG PO TABS
100.0000 mg | ORAL_TABLET | Freq: Two times a day (BID) | ORAL | 0 refills | Status: AC
  Filled 2023-06-04: qty 14, 7d supply, fill #0

## 2023-06-04 MED ORDER — NICOTINE 7 MG/24HR TD PT24
7.0000 mg | MEDICATED_PATCH | Freq: Every day | TRANSDERMAL | 0 refills | Status: AC
Start: 2023-06-04 — End: ?
  Filled 2023-06-04: qty 14, 14d supply, fill #0

## 2023-06-04 MED ORDER — PREDNISONE 10 MG PO TABS
ORAL_TABLET | ORAL | 0 refills | Status: DC
Start: 2023-06-04 — End: 2023-09-24
  Filled 2023-06-04: qty 21, 6d supply, fill #0

## 2023-06-05 ENCOUNTER — Other Ambulatory Visit: Payer: Self-pay

## 2023-06-06 ENCOUNTER — Other Ambulatory Visit: Payer: Self-pay

## 2023-06-11 ENCOUNTER — Other Ambulatory Visit: Payer: Self-pay

## 2023-06-17 ENCOUNTER — Other Ambulatory Visit (HOSPITAL_COMMUNITY): Payer: Self-pay

## 2023-06-17 ENCOUNTER — Other Ambulatory Visit: Payer: Self-pay

## 2023-06-17 ENCOUNTER — Other Ambulatory Visit: Payer: Self-pay | Admitting: Psychiatry

## 2023-06-19 ENCOUNTER — Other Ambulatory Visit: Payer: Self-pay

## 2023-06-19 MED ORDER — ONDANSETRON HCL 4 MG PO TABS
4.0000 mg | ORAL_TABLET | Freq: Three times a day (TID) | ORAL | 2 refills | Status: AC | PRN
  Filled 2023-06-19: qty 60, 20d supply, fill #0

## 2023-06-21 NOTE — Progress Notes (Signed)
 No show

## 2023-06-25 ENCOUNTER — Other Ambulatory Visit: Payer: Self-pay | Admitting: Psychiatry

## 2023-06-25 ENCOUNTER — Other Ambulatory Visit (HOSPITAL_COMMUNITY): Payer: Self-pay

## 2023-06-25 ENCOUNTER — Other Ambulatory Visit: Payer: Self-pay

## 2023-06-25 MED ORDER — FEXOFENADINE HCL 180 MG PO TABS
180.0000 mg | ORAL_TABLET | Freq: Every day | ORAL | 11 refills | Status: AC
Start: 2023-06-25 — End: ?
  Filled 2023-06-25: qty 30, 30d supply, fill #0
  Filled 2023-07-15: qty 30, 30d supply, fill #1
  Filled 2023-07-21 – 2023-08-21 (×8): qty 30, 30d supply, fill #2
  Filled 2023-09-08 – 2023-10-20 (×5): qty 30, 30d supply, fill #3

## 2023-06-25 MED ORDER — POLYETHYLENE GLYCOL 3350 17 GM/SCOOP PO POWD
17.0000 g | Freq: Every day | ORAL | 5 refills | Status: AC
  Filled 2023-06-25: qty 238, 14d supply, fill #0
  Filled 2023-10-20: qty 238, 14d supply, fill #1

## 2023-06-26 ENCOUNTER — Other Ambulatory Visit: Payer: Self-pay

## 2023-06-26 ENCOUNTER — Ambulatory Visit (INDEPENDENT_AMBULATORY_CARE_PROVIDER_SITE_OTHER): Payer: No Typology Code available for payment source | Admitting: Psychiatry

## 2023-06-26 DIAGNOSIS — Z91199 Patient's noncompliance with other medical treatment and regimen due to unspecified reason: Secondary | ICD-10-CM

## 2023-06-27 ENCOUNTER — Other Ambulatory Visit: Payer: Self-pay

## 2023-06-27 MED ORDER — AZITHROMYCIN 500 MG PO TABS
500.0000 mg | ORAL_TABLET | Freq: Every day | ORAL | 0 refills | Status: AC
  Filled 2023-06-27: qty 3, 3d supply, fill #0

## 2023-06-30 ENCOUNTER — Other Ambulatory Visit (HOSPITAL_COMMUNITY): Payer: Self-pay

## 2023-06-30 ENCOUNTER — Other Ambulatory Visit: Payer: Self-pay

## 2023-06-30 MED ORDER — FLUTICASONE PROPIONATE 50 MCG/ACT NA SUSP
2.0000 | Freq: Every day | NASAL | 5 refills | Status: DC
  Filled 2023-06-30: qty 16, 30d supply, fill #0
  Filled 2023-08-12: qty 16, 30d supply, fill #1
  Filled 2023-09-22: qty 16, 30d supply, fill #2
  Filled 2023-11-06: qty 16, 30d supply, fill #3
  Filled 2023-12-18: qty 16, 30d supply, fill #4
  Filled 2024-01-17: qty 16, 30d supply, fill #5

## 2023-07-01 ENCOUNTER — Other Ambulatory Visit: Payer: Self-pay

## 2023-07-01 ENCOUNTER — Other Ambulatory Visit (HOSPITAL_COMMUNITY): Payer: Self-pay

## 2023-07-01 MED ORDER — OMEPRAZOLE 40 MG PO CPDR
40.0000 mg | DELAYED_RELEASE_CAPSULE | Freq: Two times a day (BID) | ORAL | 1 refills | Status: DC | PRN
  Filled 2023-07-01: qty 180, 90d supply, fill #0
  Filled 2023-07-15: qty 60, 30d supply, fill #0
  Filled 2023-07-21 – 2023-08-12 (×4): qty 60, 30d supply, fill #1
  Filled 2023-09-11: qty 60, 30d supply, fill #2
  Filled 2023-10-07: qty 60, 30d supply, fill #3
  Filled 2023-11-05: qty 60, 30d supply, fill #4
  Filled 2023-11-25 – 2023-11-27 (×2): qty 60, 30d supply, fill #5

## 2023-07-01 MED ORDER — SERTRALINE HCL 100 MG PO TABS
100.0000 mg | ORAL_TABLET | Freq: Every day | ORAL | 0 refills | Status: DC
  Filled 2023-07-01: qty 30, 30d supply, fill #0
  Filled 2023-07-15: qty 17, 17d supply, fill #0
  Filled 2023-07-15: qty 90, 90d supply, fill #0
  Filled 2023-07-21 – 2023-07-24 (×2): qty 17, 17d supply, fill #1
  Filled 2023-08-07: qty 30, 30d supply, fill #1
  Filled 2023-08-12: qty 17, 17d supply, fill #1
  Filled 2023-08-15: qty 30, 30d supply, fill #1
  Filled 2023-09-01 – 2023-09-08 (×2): qty 30, 30d supply, fill #2
  Filled 2023-10-07: qty 30, 30d supply, fill #3

## 2023-07-02 ENCOUNTER — Other Ambulatory Visit: Payer: Self-pay

## 2023-07-07 ENCOUNTER — Other Ambulatory Visit (HOSPITAL_COMMUNITY): Payer: Self-pay

## 2023-07-08 ENCOUNTER — Other Ambulatory Visit: Payer: Self-pay

## 2023-07-09 ENCOUNTER — Other Ambulatory Visit (HOSPITAL_COMMUNITY): Payer: Self-pay

## 2023-07-09 ENCOUNTER — Other Ambulatory Visit: Payer: Self-pay

## 2023-07-09 MED ORDER — EMGALITY 120 MG/ML ~~LOC~~ SOAJ
1.0000 mL | SUBCUTANEOUS | 11 refills | Status: AC
  Filled 2023-07-09: qty 1, 30d supply, fill #0
  Filled 2023-09-11: qty 1, 30d supply, fill #1
  Filled 2023-10-09: qty 1, 30d supply, fill #2
  Filled 2023-11-05: qty 1, 30d supply, fill #3
  Filled 2023-12-18: qty 1, 30d supply, fill #4
  Filled 2024-01-17: qty 1, 30d supply, fill #5
  Filled 2024-02-26 – 2024-04-02 (×2): qty 1, 30d supply, fill #6

## 2023-07-09 MED ORDER — PREGABALIN 25 MG PO CAPS
25.0000 mg | ORAL_CAPSULE | Freq: Three times a day (TID) | ORAL | 3 refills | Status: DC
  Filled 2023-09-01: qty 90, 30d supply, fill #0

## 2023-07-09 MED ORDER — TRAZODONE HCL 150 MG PO TABS
150.0000 mg | ORAL_TABLET | Freq: Every evening | ORAL | 5 refills | Status: DC
  Filled 2023-07-09: qty 30, 30d supply, fill #0

## 2023-07-10 ENCOUNTER — Other Ambulatory Visit (HOSPITAL_COMMUNITY): Payer: Self-pay

## 2023-07-10 ENCOUNTER — Other Ambulatory Visit: Payer: Self-pay

## 2023-07-11 ENCOUNTER — Other Ambulatory Visit (HOSPITAL_COMMUNITY): Payer: Self-pay

## 2023-07-15 ENCOUNTER — Other Ambulatory Visit: Payer: Self-pay

## 2023-07-15 ENCOUNTER — Other Ambulatory Visit (HOSPITAL_COMMUNITY): Payer: Self-pay

## 2023-07-21 ENCOUNTER — Other Ambulatory Visit (HOSPITAL_COMMUNITY): Payer: Self-pay

## 2023-07-21 ENCOUNTER — Other Ambulatory Visit: Payer: Self-pay

## 2023-07-21 ENCOUNTER — Other Ambulatory Visit: Payer: Self-pay | Admitting: Psychiatry

## 2023-07-24 ENCOUNTER — Other Ambulatory Visit: Payer: Self-pay

## 2023-07-24 ENCOUNTER — Other Ambulatory Visit: Payer: Self-pay | Admitting: Psychiatry

## 2023-07-25 ENCOUNTER — Other Ambulatory Visit (HOSPITAL_COMMUNITY): Payer: Self-pay

## 2023-07-25 ENCOUNTER — Other Ambulatory Visit: Payer: Self-pay

## 2023-07-25 MED ORDER — VANCOMYCIN HCL 125 MG PO CAPS
125.0000 mg | ORAL_CAPSULE | Freq: Four times a day (QID) | ORAL | 0 refills | Status: AC
Start: 2023-07-25 — End: 2023-08-12
  Filled 2023-07-25: qty 56, 14d supply, fill #0

## 2023-07-25 MED ORDER — AZITHROMYCIN 500 MG PO TABS
500.0000 mg | ORAL_TABLET | Freq: Every day | ORAL | 0 refills | Status: AC
  Filled 2023-07-25: qty 3, 3d supply, fill #0

## 2023-07-28 ENCOUNTER — Other Ambulatory Visit (HOSPITAL_COMMUNITY): Payer: Self-pay

## 2023-07-29 ENCOUNTER — Encounter (INDEPENDENT_AMBULATORY_CARE_PROVIDER_SITE_OTHER): Payer: Self-pay

## 2023-07-29 ENCOUNTER — Other Ambulatory Visit (HOSPITAL_COMMUNITY): Payer: Self-pay

## 2023-08-07 ENCOUNTER — Other Ambulatory Visit (HOSPITAL_COMMUNITY): Payer: Self-pay

## 2023-08-07 ENCOUNTER — Other Ambulatory Visit: Payer: Self-pay

## 2023-08-08 ENCOUNTER — Other Ambulatory Visit: Payer: Self-pay

## 2023-08-11 ENCOUNTER — Telehealth: Payer: Self-pay | Admitting: Psychiatry

## 2023-08-11 ENCOUNTER — Other Ambulatory Visit: Payer: Self-pay
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
## 2023-08-11 NOTE — Telephone Encounter (Signed)
 Could you please ask her if she's interested in scheduling a sooner visit today? While an in-person visit is recommended, we can do a video visit if it works for her this time. If a visit isn't possible, will plan to send a refill.

## 2023-08-12 ENCOUNTER — Other Ambulatory Visit (HOSPITAL_COMMUNITY): Payer: Self-pay

## 2023-08-12 ENCOUNTER — Other Ambulatory Visit: Payer: Self-pay

## 2023-08-12 ENCOUNTER — Other Ambulatory Visit: Payer: Self-pay | Admitting: Psychiatry

## 2023-08-13 ENCOUNTER — Other Ambulatory Visit: Payer: Self-pay

## 2023-08-14 ENCOUNTER — Other Ambulatory Visit (HOSPITAL_COMMUNITY): Payer: Self-pay

## 2023-08-14 ENCOUNTER — Other Ambulatory Visit: Payer: Self-pay

## 2023-08-14 ENCOUNTER — Ambulatory Visit (INDEPENDENT_AMBULATORY_CARE_PROVIDER_SITE_OTHER): Admitting: Psychiatry

## 2023-08-14 ENCOUNTER — Encounter: Payer: Self-pay | Admitting: Psychiatry

## 2023-08-14 VITALS — BP 118/82 | HR 115 | Ht <= 58 in | Wt 187.2 lb

## 2023-08-14 DIAGNOSIS — F250 Schizoaffective disorder, bipolar type: Secondary | ICD-10-CM | POA: Diagnosis not present

## 2023-08-14 DIAGNOSIS — R45851 Suicidal ideations: Secondary | ICD-10-CM

## 2023-08-14 DIAGNOSIS — F4310 Post-traumatic stress disorder, unspecified: Secondary | ICD-10-CM

## 2023-08-14 MED ORDER — ALBUTEROL SULFATE HFA 108 (90 BASE) MCG/ACT IN AERS
INHALATION_SPRAY | RESPIRATORY_TRACT | 1 refills | Status: AC
  Filled 2023-08-14: qty 18, 16d supply, fill #0
  Filled 2023-09-22: qty 18, 16d supply, fill #1

## 2023-08-14 MED ORDER — LURASIDONE HCL 80 MG PO TABS
80.0000 mg | ORAL_TABLET | Freq: Every day | ORAL | 1 refills | Status: DC
  Filled 2023-08-14 – 2023-08-15 (×2): qty 30, 30d supply, fill #0
  Filled 2023-09-08: qty 30, 30d supply, fill #1

## 2023-08-14 MED ORDER — TIOTROPIUM BROMIDE MONOHYDRATE 18 MCG IN CAPS
1.0000 | ORAL_CAPSULE | Freq: Every day | RESPIRATORY_TRACT | 5 refills | Status: AC
  Filled 2023-08-14 (×2): qty 30, 30d supply, fill #0
  Filled 2023-09-22: qty 30, 30d supply, fill #1
  Filled 2023-11-05 – 2023-11-06 (×2): qty 30, 30d supply, fill #2
  Filled 2023-12-01: qty 30, 30d supply, fill #3

## 2023-08-14 NOTE — Patient Instructions (Signed)
 Increase latuda  80 mg daily  Continue sertraline  100 mg daily  Next appointment: 7/21 at 10 am

## 2023-08-15 ENCOUNTER — Other Ambulatory Visit: Payer: Self-pay

## 2023-08-16 ENCOUNTER — Other Ambulatory Visit (HOSPITAL_COMMUNITY): Payer: Self-pay

## 2023-08-18 ENCOUNTER — Other Ambulatory Visit (HOSPITAL_COMMUNITY): Payer: Self-pay

## 2023-08-18 ENCOUNTER — Other Ambulatory Visit: Payer: Self-pay

## 2023-08-21 ENCOUNTER — Other Ambulatory Visit (HOSPITAL_COMMUNITY): Payer: Self-pay

## 2023-08-21 ENCOUNTER — Other Ambulatory Visit: Payer: Self-pay

## 2023-08-22 ENCOUNTER — Ambulatory Visit: Admitting: Anesthesiology

## 2023-08-22 ENCOUNTER — Ambulatory Visit
Admission: RE | Admit: 2023-08-22 | Discharge: 2023-08-22 | Disposition: A | Discharge status: Home / Self Care | Payer: Self-pay | Attending: Gastroenterology | Admitting: Gastroenterology

## 2023-08-22 ENCOUNTER — Encounter
Admission: RE | Disposition: A | Discharge status: Home / Self Care | Payer: Self-pay | Source: Home / Self Care | Attending: Gastroenterology

## 2023-08-22 ENCOUNTER — Other Ambulatory Visit: Payer: Self-pay

## 2023-08-22 DIAGNOSIS — N184 Chronic kidney disease, stage 4 (severe): Secondary | ICD-10-CM | POA: Diagnosis not present

## 2023-08-22 DIAGNOSIS — Z79899 Other long term (current) drug therapy: Secondary | ICD-10-CM | POA: Insufficient documentation

## 2023-08-22 DIAGNOSIS — K2950 Unspecified chronic gastritis without bleeding: Secondary | ICD-10-CM | POA: Insufficient documentation

## 2023-08-22 DIAGNOSIS — E1122 Type 2 diabetes mellitus with diabetic chronic kidney disease: Secondary | ICD-10-CM | POA: Insufficient documentation

## 2023-08-22 DIAGNOSIS — G4730 Sleep apnea, unspecified: Secondary | ICD-10-CM | POA: Insufficient documentation

## 2023-08-22 DIAGNOSIS — F419 Anxiety disorder, unspecified: Secondary | ICD-10-CM | POA: Diagnosis not present

## 2023-08-22 DIAGNOSIS — F319 Bipolar disorder, unspecified: Secondary | ICD-10-CM | POA: Diagnosis not present

## 2023-08-22 DIAGNOSIS — Z7984 Long term (current) use of oral hypoglycemic drugs: Secondary | ICD-10-CM | POA: Insufficient documentation

## 2023-08-22 DIAGNOSIS — E669 Obesity, unspecified: Secondary | ICD-10-CM | POA: Diagnosis not present

## 2023-08-22 DIAGNOSIS — K219 Gastro-esophageal reflux disease without esophagitis: Secondary | ICD-10-CM | POA: Diagnosis not present

## 2023-08-22 DIAGNOSIS — Z6839 Body mass index (BMI) 39.0-39.9, adult: Secondary | ICD-10-CM | POA: Insufficient documentation

## 2023-08-22 DIAGNOSIS — R1013 Epigastric pain: Secondary | ICD-10-CM | POA: Diagnosis present

## 2023-08-22 DIAGNOSIS — Z9049 Acquired absence of other specified parts of digestive tract: Secondary | ICD-10-CM | POA: Diagnosis not present

## 2023-08-22 DIAGNOSIS — D649 Anemia, unspecified: Secondary | ICD-10-CM | POA: Insufficient documentation

## 2023-08-22 DIAGNOSIS — I129 Hypertensive chronic kidney disease with stage 1 through stage 4 chronic kidney disease, or unspecified chronic kidney disease: Secondary | ICD-10-CM | POA: Diagnosis not present

## 2023-08-22 DIAGNOSIS — K279 Peptic ulcer, site unspecified, unspecified as acute or chronic, without hemorrhage or perforation: Secondary | ICD-10-CM | POA: Insufficient documentation

## 2023-08-22 DIAGNOSIS — Z7952 Long term (current) use of systemic steroids: Secondary | ICD-10-CM | POA: Insufficient documentation

## 2023-08-22 DIAGNOSIS — Z79624 Long term (current) use of inhibitors of nucleotide synthesis: Secondary | ICD-10-CM | POA: Insufficient documentation

## 2023-08-22 DIAGNOSIS — J449 Chronic obstructive pulmonary disease, unspecified: Secondary | ICD-10-CM | POA: Diagnosis not present

## 2023-08-22 DIAGNOSIS — Z87891 Personal history of nicotine dependence: Secondary | ICD-10-CM | POA: Insufficient documentation

## 2023-08-22 DIAGNOSIS — K921 Melena: Secondary | ICD-10-CM | POA: Diagnosis not present

## 2023-08-22 DIAGNOSIS — R519 Headache, unspecified: Secondary | ICD-10-CM | POA: Diagnosis not present

## 2023-08-22 HISTORY — PX: ESOPHAGOGASTRODUODENOSCOPY: SHX5428

## 2023-08-22 LAB — GLUCOSE, CAPILLARY: Glucose-Capillary: 144 mg/dL — ABNORMAL HIGH (ref 70–99)

## 2023-08-22 SURGERY — EGD (ESOPHAGOGASTRODUODENOSCOPY)
Anesthesia: General

## 2023-08-22 MED ORDER — PROPOFOL 10 MG/ML IV BOLUS
INTRAVENOUS | Status: DC | PRN
Start: 2023-08-22 — End: 2023-08-22
  Administered 2023-08-22 (×2): 50 mg via INTRAVENOUS

## 2023-08-22 MED ORDER — DEXMEDETOMIDINE HCL IN NACL 80 MCG/20ML IV SOLN
INTRAVENOUS | Status: DC | PRN
Start: 2023-08-22 — End: 2023-08-22
  Administered 2023-08-22: 20 ug via INTRAVENOUS

## 2023-08-22 MED ORDER — DEXMEDETOMIDINE HCL IN NACL 80 MCG/20ML IV SOLN
INTRAVENOUS | Status: AC
  Filled 2023-08-22: qty 20

## 2023-08-22 MED ORDER — PROPOFOL 500 MG/50ML IV EMUL
INTRAVENOUS | Status: DC | PRN
  Administered 2023-08-22: 125 ug/kg/min via INTRAVENOUS

## 2023-08-22 MED ORDER — LIDOCAINE HCL (CARDIAC) PF 100 MG/5ML IV SOSY
PREFILLED_SYRINGE | INTRAVENOUS | Status: DC | PRN
  Administered 2023-08-22: 60 mg via INTRAVENOUS

## 2023-08-22 MED ORDER — GLYCOPYRROLATE 0.2 MG/ML IJ SOLN
INTRAMUSCULAR | Status: DC | PRN
  Administered 2023-08-22: .2 mg via INTRAVENOUS

## 2023-08-22 MED ORDER — LIDOCAINE HCL (PF) 2 % IJ SOLN
INTRAMUSCULAR | Status: AC
  Filled 2023-08-22: qty 5

## 2023-08-22 MED ORDER — PROPOFOL 1000 MG/100ML IV EMUL
INTRAVENOUS | Status: AC
  Filled 2023-08-22: qty 100

## 2023-08-22 MED ORDER — GLYCOPYRROLATE 0.2 MG/ML IJ SOLN
INTRAMUSCULAR | Status: AC
  Filled 2023-08-22: qty 1

## 2023-08-22 MED ORDER — SODIUM CHLORIDE 0.9 % IV SOLN: INTRAVENOUS | Status: DC

## 2023-08-22 MED ORDER — EPHEDRINE 5 MG/ML INJ
INTRAVENOUS | Status: AC
  Filled 2023-08-22: qty 5

## 2023-08-22 MED ORDER — GLYCOPYRROLATE 0.2 MG/ML IJ SOLN
INTRAMUSCULAR | Status: AC
Start: 2023-08-22 — End: 2023-08-22
  Filled 2023-08-22: qty 1

## 2023-08-22 NOTE — Transfer of Care (Signed)
 Immediate Anesthesia Transfer of Care Note  Patient: Darlene Barber  Procedure(s) Performed: EGD (ESOPHAGOGASTRODUODENOSCOPY)  Patient Location: PACU  Anesthesia Type:General  Level of Consciousness: sedated  Airway & Oxygen Therapy: Patient Spontanous Breathing  Post-op Assessment: Report given to RN and Post -op Vital signs reviewed and stable  Post vital signs: Reviewed and stable  Last Vitals:  Vitals Value Taken Time  BP    Temp    Pulse    Resp    SpO2      Last Pain:  Vitals:   08/22/23 1016  TempSrc: Tympanic         Complications: No notable events documented.

## 2023-08-22 NOTE — Anesthesia Postprocedure Evaluation (Signed)
 Anesthesia Post Note  Patient: Darlene Barber  Procedure(s) Performed: EGD (ESOPHAGOGASTRODUODENOSCOPY)  Patient location during evaluation: PACU Anesthesia Type: General Level of consciousness: awake Pain management: satisfactory to patient Vital Signs Assessment: post-procedure vital signs reviewed and stable Respiratory status: nonlabored ventilation Cardiovascular status: blood pressure returned to baseline and stable Anesthetic complications: no   No notable events documented.   Last Vitals:  Vitals:   08/22/23 1113 08/22/23 1122  BP: 119/62 114/74  Pulse: 67 71  Resp: 13 14  Temp: (!) 36.1 C   SpO2: 93% 98%    Last Pain:  Vitals:   08/22/23 1122  TempSrc:   PainSc: 0-No pain                 VAN STAVEREN,Mairi Stagliano

## 2023-08-22 NOTE — H&P (Signed)
 Outpatient short stay form Pre-procedure 08/22/2023  Shane Darling, MD  Primary Physician: Little Riff, MD  Reason for visit:  Dyspepsia/Melena  History of present illness:    58 y/o lady with history of hypertension, obesity, COPD, and CKD here for EGD for dyspepsia/melena in setting of NSAID use. Takes PPI daily now. History of partial colectomy for ascending colon strictures. No blood thinners. No family history of GI malignancies.    Current Facility-Administered Medications:    0.9 %  sodium chloride  infusion, , Intravenous, Continuous, Letoya Stallone, Leanora Prophet, MD, Last Rate: 20 mL/hr at 08/22/23 1028, Continued from Pre-op  at 08/22/23 1028  Facility-Administered Medications Prior to Admission  Medication Dose Route Frequency Provider Last Rate Last Admin   lidocaine  HCl (PF) (XYLOCAINE ) 2 % injection 50 mL  50 mL Other Once Erman Hayward, MD       Medications Prior to Admission  Medication Sig Dispense Refill Last Dose/Taking   albuterol  (VENTOLIN  HFA) 108 (90 Base) MCG/ACT inhaler Inhale 2 puffs into the lungs every 4 (four) hours as needed. 18 g 1 08/22/2023 at  8:00 AM   amLODipine  (NORVASC ) 10 MG tablet Take 1 tablet (10 mg total) by mouth daily. 90 tablet 1 08/22/2023 Morning   metoprolol  tartrate (LOPRESSOR ) 25 MG tablet Take 1 tablet (25 mg total) by mouth 2 (two) times daily 180 tablet 0 08/22/2023 at  8:00 AM   omeprazole  (PRILOSEC) 40 MG capsule Take 1 capsule (40 mg total) by mouth 2 (two) times daily as needed. 180 capsule 1 08/22/2023 at  8:00 AM   pregabalin  (LYRICA ) 25 MG capsule Take 1 capsule (25 mg total) by mouth 3 (three) times daily. 90 capsule 3 08/22/2023 at  8:00 AM   sucralfate  (CARAFATE ) 1 g tablet Take 1 tablet (1 g total) by mouth 2 (two) times daily before meals. 180 tablet 2 08/22/2023 at  8:00 AM   traZODone  (DESYREL ) 150 MG tablet Take 1 tablet (150 mg total) by mouth at bedtime for sleep difficulty. 30 tablet 5 08/22/2023 at  8:00 AM    acetaminophen  (TYLENOL ) 650 MG CR tablet Take 1,300 mg by mouth every 8 (eight) hours as needed for pain.      acyclovir  (ZOVIRAX ) 400 MG tablet Take 1 tablet (400 mg total) by mouth every 8 (eight) hours. (Patient taking differently: Take 400 mg by mouth in the morning and at bedtime.) 180 tablet 3    Albuterol  Sulfate (PROAIR  RESPICLICK) 108 (90 Base) MCG/ACT AEPB Inhale 2 puffs into the lungs 4 (four) times daily. 1 each 1    amLODipine  (NORVASC ) 10 MG tablet Take 1 tablet (10 mg total) by mouth daily. 90 tablet 1    azithromycin  (ZITHROMAX ) 500 MG tablet Take 1 tablet (500 mg total) by mouth daily for 3 days. 3 tablet 0    calcitRIOL  (ROCALTROL ) 0.25 MCG capsule Take 1 capsule (0.25 mcg total) by mouth daily. 30 capsule 11    Calcium  Carbonate Antacid (ANTACID SOFT CHEWS PO) Take 1 tablet by mouth every 6 (six) hours as needed (for indigesation- CHEW).      clotrimazole -betamethasone  (LOTRISONE ) cream Apply topically to affected area 2 (two) times daily 45 g 1    DM-Doxylamine -Acetaminophen  (FT NIGHTTIME COLD & FLU) 15-6.25-325 MG/15ML LIQD Take 30 mLs by mouth at bedtime. 355 mL 0    doxycycline  (VIBRA -TABS) 100 MG tablet Take 1 tablet (100 mg total) by mouth 2 (two) times daily for 7 days 14 tablet 0    fexofenadine  (ALLEGRA ) 180  MG tablet Take 1 tablet (180 mg total) by mouth daily. 30 tablet 11    fluticasone  (FLONASE ) 50 MCG/ACT nasal spray Place 2 sprays into both nostrils daily. 16 g 5    Galcanezumab -gnlm (EMGALITY ) 120 MG/ML SOAJ Inject 120 mg into the skin every 30 (thirty) days. 1 mL 5    Galcanezumab -gnlm (EMGALITY ) 120 MG/ML SOAJ Inject 1 mL (120 mg) into the skin every 30 (thirty) days. 1 mL 3    Galcanezumab -gnlm (EMGALITY ) 120 MG/ML SOAJ Inject 1 mL into the skin monthly. 1 mL 11    hydrALAZINE  (APRESOLINE ) 25 MG tablet Take 1 tablet (25 mg total) by mouth in the morning and 1 tablet (25 mg total) in the evening and 1 tablet (25 mg total) before bedtime. 90 tablet 11    ibuprofen  (ADVIL) 200 MG tablet Take 400 mg by mouth every 6 (six) hours as needed for moderate pain (pain score 4-6).      loratadine  (CLARITIN ) 10 MG tablet Take 1 tablet (10 mg total) by mouth once daily 30 tablet 11    lurasidone  (LATUDA ) 80 MG TABS tablet Take 1 tablet (80 mg total) by mouth daily with breakfast. 30 tablet 1    metoprolol  tartrate (LOPRESSOR ) 50 MG tablet Take 1 tablet (50 mg total) by mouth 2 (two) times daily. 60 tablet 11    nicotine  (NICODERM CQ  - DOSED IN MG/24 HR) 7 mg/24hr patch Place 1 patch onto the skin daily for 14 days 14 patch 0    nortriptyline  (PAMELOR ) 25 MG capsule Take 1 capsule (25 mg total) by mouth every morning AND 2 capsules (50 mg total) at bedtime. 270 capsule 1    ondansetron  (ZOFRAN ) 4 MG tablet Take 1 tablet (4 mg total) by mouth every 8 (eight) hours as needed. 60 tablet 2    ondansetron  (ZOFRAN -ODT) 4 MG disintegrating tablet Dissolve 1 tablet (4 mg total) by mouth every 8 (eight) hours as needed. 20 tablet 3    ondansetron  (ZOFRAN -ODT) 8 MG disintegrating tablet Dissolve 1 tablet under the tongue every 8 (eight) hours if needed for nausea or vomiting 40 tablet 2    oxyCODONE -acetaminophen  (PERCOCET/ROXICET) 5-325 MG tablet Take 1-2 tablets by mouth every 4 (four) hours as needed for severe pain (pain score 7-10). 30 tablet 0    polyethylene glycol powder (GLYCOLAX /MIRALAX ) 17 GM/SCOOP powder Mix 17 g in 4-8 ounces of fluid and drink  by mouth daily. 238 g 5    predniSONE  (DELTASONE ) 10 MG tablet Take 6 tablets daily on day 1, then 5 tablets daily on day 2, then 4 tablets on day 3, then 3 tablets on day 4 then 2 tablets on day 5 then 1 tablet on day 6. 21 tablet 0    pregabalin  (LYRICA ) 25 MG capsule Take 1 capsule (25 mg total) by mouth 3 (three) times daily. 90 capsule 3    sertraline  (ZOLOFT ) 100 MG tablet Take 1 tablet (100 mg total) by mouth at bedtime. 90 tablet 0    sodium bicarbonate  650 MG tablet Take 1 tablet (650 mg total) by mouth 3 (three) times  daily. (morning, evening and bedtime) 90 tablet 3    terbinafine  (LAMISIL ) 250 MG tablet Take 250 mg by mouth daily.      tiotropium (SPIRIVA  HANDIHALER) 18 MCG inhalation capsule Place 1 capsule (18 mcg total) into inhaler and inhale daily. 30 capsule 5    torsemide  (DEMADEX ) 20 MG tablet Take 1 tablet (20 mg total) by mouth daily. 30 tablet  11    traZODone  (DESYREL ) 150 MG tablet Take 1 tablet (150 mg total) by mouth at bedtime. 30 tablet 5    traZODone  (DESYREL ) 50 MG tablet Take 1 tablet (50 mg total) by mouth at bedtime as needed for sleep. (Patient not taking: Reported on 02/18/2023) 30 tablet 11      Allergies  Allergen Reactions   Other Anaphylaxis, Itching, Swelling and Other (See Comments)    Black pepper   Black Pepper-Turmeric    Latex Rash   Silicone Rash   Tape Rash and Other (See Comments)    Paper tape is ok to use.     Past Medical History:  Diagnosis Date   Allergy    Anemia    Anxiety    Bursitis of both hips    CKD (chronic kidney disease) stage 4, GFR 15-29 ml/min (HCC)    COPD (chronic obstructive pulmonary disease) (HCC)    Depression    Diabetes mellitus without complication (HCC)    Frequent headaches    GERD (gastroesophageal reflux disease)    Hypertension    Obesity    Sleep apnea    doesn't use CPAP machine broken,     Review of systems:  Otherwise negative.    Physical Exam  Gen: Alert, oriented. Appears stated age.  HEENT: PERRLA. Lungs: No respiratory distress CV: RRR Abd: soft, benign, no masses Ext: No edema    Planned procedures: Proceed with EGD. The patient understands the nature of the planned procedure, indications, risks, alternatives and potential complications including but not limited to bleeding, infection, perforation, damage to internal organs and possible oversedation/side effects from anesthesia. The patient agrees and gives consent to proceed.  Please refer to procedure notes for findings, recommendations and  patient disposition/instructions.     Shane Darling, MD [facility]

## 2023-08-22 NOTE — Anesthesia Preprocedure Evaluation (Signed)
 Anesthesia Evaluation  Patient identified by MRN, date of birth, ID band Patient awake    Reviewed: Allergy & Precautions, NPO status , [name] Chart, lab work & pertinent test results  Airway Mallampati: II  TM Distance: >3 FB Neck ROM: full    Dental  (+) Edentulous Upper, Edentulous Lower   Pulmonary neg pulmonary ROS, sleep apnea , COPD,  COPD inhaler, former smoker   Pulmonary exam normal  + decreased breath sounds      Cardiovascular Exercise Tolerance: Poor hypertension, Pt. on medications negative cardio ROS Normal cardiovascular exam Rhythm:Regular     Neuro/Psych  Headaches  Anxiety  Bipolar Disorder   negative neurological ROS  negative psych ROS   GI/Hepatic negative GI ROS, Neg liver ROS, PUD,GERD  Medicated,,  Endo/Other  negative endocrine ROSdiabetes, Oral Hypoglycemic Agents    Renal/GU CRFRenal diseasenegative Renal ROS  negative genitourinary   Musculoskeletal   Abdominal  (+) + obese  Peds negative pediatric ROS (+)  Hematology negative hematology ROS (+) Blood dyscrasia, anemia   Anesthesia Other Findings Past Medical History: No date: Allergy No date: Anemia No date: Anxiety No date: Bursitis of both hips No date: CKD (chronic kidney disease) stage 4, GFR 15-29 ml/min (HCC) No date: COPD (chronic obstructive pulmonary disease) (HCC) No date: Depression No date: Diabetes mellitus without complication (HCC) No date: Frequent headaches No date: GERD (gastroesophageal reflux disease) No date: Hypertension No date: Obesity No date: Sleep apnea     Comment:  doesn't use CPAP machine broken,   Past Surgical History: 02/28/2023: AV FISTULA PLACEMENT; Left     Comment:  Procedure: INSERTION OF ARTERIOVENOUS (AV) GORE-TEX               GRAFT ARM (BRACHIAL AXILLARY);  Surgeon: Jackquelyn Mass, MD;  Location: [facility] ORS;  Service: Vascular;                Laterality:  Left; 03/30/2022: BIOPSY     Comment:  Procedure: BIOPSY;  Surgeon: Ace Holder, MD;                Location: [facility] ENDOSCOPY;  Service: Gastroenterology;; No date: CESAREAN SECTION     Comment:  x2 No date: CHOLECYSTECTOMY 11/20/2017: COLONOSCOPY WITH PROPOFOL ; N/A     Comment:  Procedure: COLONOSCOPY WITH PROPOFOL ;  Surgeon: Selena Daily, MD;  Location: [facility] ENDOSCOPY;  Service:               Gastroenterology;  Laterality: N/A; 03/30/2022: COLONOSCOPY WITH PROPOFOL ; N/A     Comment:  Procedure: COLONOSCOPY WITH PROPOFOL ;  Surgeon:               Ace Holder, MD;  Location: [facility] ENDOSCOPY;                Service: Gastroenterology;  Laterality: N/A; 08/02/2022: COLONOSCOPY WITH PROPOFOL ; N/A     Comment:  Procedure: COLONOSCOPY WITH PROPOFOL ;  Surgeon: Selena Daily, MD;  Location: [facility] ENDOSCOPY;  Service:               Gastroenterology;  Laterality: N/A; 03/30/2022: ESOPHAGOGASTRODUODENOSCOPY (EGD) WITH PROPOFOL ; N/A     Comment:  Procedure: ESOPHAGOGASTRODUODENOSCOPY (EGD) WITH  PROPOFOL ;  Surgeon: Ace Holder, MD;  Location:               [facility] ENDOSCOPY;  Service: Gastroenterology;  Laterality:               N/A; 1998: HYSTERECTOMY ABDOMINAL WITH SALPINGECTOMY No date: HYSTEROSCOPY 08/03/2022: LAPAROSCOPIC SUBTOTAL COLECTOMY; Right     Comment:  Procedure: HAND ASSISTED LAPAROSCOPIC SUBTOTAL               COLECTOMY;  Surgeon: Alben Alma, MD;  Location: [facility]               ORS;  Service: General;  Laterality: Right;  BMI    Body Mass Index: 39.42 kg/m      Reproductive/Obstetrics negative OB ROS                             Anesthesia Physical Anesthesia Plan  ASA: 3  Anesthesia Plan: General   Post-op Pain Management:    Induction: Intravenous  PONV Risk Score and Plan: Propofol  infusion and TIVA  Airway Management Planned: Natural Airway and Nasal Cannula  Additional  Equipment:   Intra-op Plan:   Post-operative Plan:   Informed Consent: I have reviewed the patients History and Physical, chart, labs and discussed the procedure including the risks, benefits and alternatives for the proposed anesthesia with the patient or authorized representative who has indicated his/her understanding and acceptance.     Dental Advisory Given  Plan Discussed with: CRNA  Anesthesia Plan Comments:        Anesthesia Quick Evaluation

## 2023-08-22 NOTE — Op Note (Signed)
 [facility] Patient Name: Darlene Barber Procedure Date: 08/22/2023 10:45 AM MRN: [id number] Account #: [id number] Date of Birth: [birth year] Admit Type: Outpatient Age: 58 Room: [facility] ENDO ROOM 3 Gender: Female Note Status: Finalized Instrument Name: Upper Endoscope [id number] Procedure:             Upper GI endoscopy Indications:           Dyspepsia, Melena Providers:             Leida Puna MD, MD Referring MD:          Little Riff, MD (Referring MD) Medicines:             Monitored Anesthesia Care Complications:         No immediate complications. Estimated blood loss:                         Minimal. Procedure:             Pre-Anesthesia Assessment:                        - Prior to the procedure, a History and Physical was                         performed, and patient medications and allergies were                         reviewed. The patient is competent. The risks and                         benefits of the procedure and the sedation options and                         risks were discussed with the patient. All questions                         were answered and informed consent was obtained.                         Patient identification and proposed procedure were                         verified by the physician, the nurse, the                         anesthesiologist, the anesthetist and the technician                         in the endoscopy suite. Mental Status Examination:                         alert and oriented. Airway Examination: normal                         oropharyngeal airway and neck mobility. Respiratory                         Examination: clear to auscultation. CV Examination:  normal. Prophylactic Antibiotics: The patient does not                         require prophylactic antibiotics. Prior                         Anticoagulants: The patient has taken no anticoagulant                          or antiplatelet agents. ASA Grade Assessment: III - A                         patient with severe systemic disease. After reviewing                         the risks and benefits, the patient was deemed in                         satisfactory condition to undergo the procedure. The                         anesthesia plan was to use monitored anesthesia care                         (MAC). Immediately prior to administration of                         medications, the patient was re-assessed for adequacy                         to receive sedatives. The heart rate, respiratory                         rate, oxygen saturations, blood pressure, adequacy of                         pulmonary ventilation, and response to care were                         monitored throughout the procedure. The physical                         status of the patient was re-assessed after the                         procedure.                        After obtaining informed consent, the endoscope was                         passed under direct vision. Throughout the procedure,                         the patient's blood pressure, pulse, and oxygen                         saturations were monitored continuously. The Endoscope  was introduced through the mouth, and advanced to the                         second part of duodenum. The upper GI endoscopy was                         accomplished without difficulty. The patient tolerated                         the procedure well. Findings:      The examined esophagus was normal.      Patchy mild inflammation characterized by erythema was found in the       gastric antrum. Biopsies were taken with a cold forceps for Helicobacter       pylori testing. Estimated blood loss was minimal.      The examined duodenum was normal. Impression:            - Normal esophagus.                        - Gastritis. Biopsied.                        - Normal  examined duodenum. Recommendation:        - Discharge patient to home.                        - Resume previous diet.                        - Continue present medications.                        - Await pathology results.                        - Return to referring physician as previously                         scheduled. Procedure Code(s):     --- Professional ---                        [id number], Esophagogastroduodenoscopy, flexible,                         transoral; with biopsy, single or multiple Diagnosis Code(s):     --- Professional ---                        K29.70, Gastritis, unspecified, without bleeding                        R10.13, Epigastric pain                        K92.1, Melena (includes Hematochezia) CPT copyright 2022 [employer]. All rights reserved. The codes documented in this report are preliminary and upon coder review may  be revised to meet current compliance requirements. Leida Puna MD, MD 08/22/2023 11:12:17 AM Number of Addenda: 0 Note Initiated On: 08/22/2023 10:45 AM Estimated Blood Loss:  Estimated blood loss was minimal.      [place]  [facility]

## 2023-08-22 NOTE — Interval H&P Note (Signed)
 History and Physical Interval Note:  08/22/2023 10:52 AM  Darlene Barber  has presented today for surgery, with the diagnosis of Melena (K92.1) Dyspepsia (R10.13).  The various methods of treatment have been discussed with the patient and family. After consideration of risks, benefits and other options for treatment, the patient has consented to  Procedure(s): EGD (ESOPHAGOGASTRODUODENOSCOPY) (N/A) as a surgical intervention.  The patient's history has been reviewed, patient examined, no change in status, stable for surgery.  I have reviewed the patient's chart and labs.  Questions were answered to the patient's satisfaction.     Shane Darling  Ok to proceed with EGD

## 2023-08-25 ENCOUNTER — Other Ambulatory Visit: Payer: Self-pay

## 2023-08-25 ENCOUNTER — Other Ambulatory Visit (HOSPITAL_COMMUNITY): Payer: Self-pay

## 2023-08-25 ENCOUNTER — Ambulatory Visit: Admitting: Psychiatry

## 2023-08-25 LAB — SURGICAL PATHOLOGY

## 2023-08-25 MED ORDER — CLOBETASOL PROPIONATE 0.05 % EX OINT
1.0000 | TOPICAL_OINTMENT | Freq: Two times a day (BID) | CUTANEOUS | 1 refills | Status: AC | PRN
  Filled 2023-08-25: qty 45, 23d supply, fill #0

## 2023-08-25 MED ORDER — MOMETASONE FUROATE 0.1 % EX SOLN
1.0000 | CUTANEOUS | 2 refills | Status: AC | PRN
  Filled 2023-08-25: qty 60, 30d supply, fill #0

## 2023-08-26 ENCOUNTER — Other Ambulatory Visit: Payer: Self-pay

## 2023-08-26 ENCOUNTER — Other Ambulatory Visit (HOSPITAL_COMMUNITY): Payer: Self-pay

## 2023-08-28 ENCOUNTER — Encounter: Admitting: Psychiatry

## 2023-08-28 ENCOUNTER — Other Ambulatory Visit (HOSPITAL_COMMUNITY): Payer: Self-pay

## 2023-08-28 NOTE — Progress Notes (Signed)
 This encounter was created in error - please disregard.

## 2023-09-01 ENCOUNTER — Other Ambulatory Visit (HOSPITAL_COMMUNITY): Payer: Self-pay

## 2023-09-01 ENCOUNTER — Other Ambulatory Visit: Payer: Self-pay

## 2023-09-02 ENCOUNTER — Other Ambulatory Visit: Payer: Self-pay

## 2023-09-02 MED ORDER — PREGABALIN 25 MG PO CAPS: 25.0000 mg | ORAL_CAPSULE | Freq: Three times a day (TID) | ORAL | 3 refills | Status: DC

## 2023-09-05 ENCOUNTER — Other Ambulatory Visit: Payer: Self-pay

## 2023-09-05 ENCOUNTER — Other Ambulatory Visit (HOSPITAL_COMMUNITY): Payer: Self-pay

## 2023-09-05 MED ORDER — PREGABALIN 25 MG PO CAPS
25.0000 mg | ORAL_CAPSULE | Freq: Three times a day (TID) | ORAL | 3 refills | Status: DC
  Filled 2023-09-05: qty 90, 30d supply, fill #0

## 2023-09-08 ENCOUNTER — Other Ambulatory Visit (HOSPITAL_COMMUNITY): Payer: Self-pay

## 2023-09-08 ENCOUNTER — Other Ambulatory Visit: Payer: Self-pay

## 2023-09-08 MED ORDER — TRAZODONE HCL 150 MG PO TABS
150.0000 mg | ORAL_TABLET | Freq: Every evening | ORAL | 5 refills | Status: DC
  Filled 2023-09-08: qty 30, 30d supply, fill #0
  Filled 2023-10-07: qty 30, 30d supply, fill #1
  Filled 2023-11-05: qty 30, 30d supply, fill #2
  Filled 2023-12-09 – 2023-12-15 (×2): qty 30, 30d supply, fill #3
  Filled 2024-01-08 – 2024-01-15 (×3): qty 30, 30d supply, fill #4
  Filled 2024-01-29 – 2024-02-09 (×2): qty 30, 30d supply, fill #5

## 2023-09-08 MED ORDER — SUCRALFATE 1 G PO TABS
1.0000 g | ORAL_TABLET | Freq: Two times a day (BID) | ORAL | 2 refills | Status: AC
  Filled 2023-09-08: qty 180, 90d supply, fill #0
  Filled 2023-09-09: qty 60, 30d supply, fill #0
  Filled 2023-10-07: qty 60, 30d supply, fill #1
  Filled 2023-11-05: qty 60, 30d supply, fill #2
  Filled 2023-12-09 – 2023-12-15 (×2): qty 60, 30d supply, fill #3
  Filled 2024-01-08 – 2024-01-15 (×3): qty 60, 30d supply, fill #4
  Filled 2024-01-29 – 2024-02-09 (×2): qty 60, 30d supply, fill #5
  Filled 2024-03-12: qty 60, 30d supply, fill #6
  Filled 2024-04-12: qty 60, 30d supply, fill #7

## 2023-09-09 ENCOUNTER — Other Ambulatory Visit (HOSPITAL_COMMUNITY): Payer: Self-pay

## 2023-09-09 ENCOUNTER — Other Ambulatory Visit: Payer: Self-pay

## 2023-09-09 MED ORDER — SODIUM BICARBONATE 650 MG PO TABS
650.0000 mg | ORAL_TABLET | Freq: Three times a day (TID) | ORAL | 3 refills | Status: AC
  Filled 2023-09-09: qty 90, 30d supply, fill #0

## 2023-09-10 ENCOUNTER — Other Ambulatory Visit (HOSPITAL_COMMUNITY): Payer: Self-pay

## 2023-09-10 ENCOUNTER — Other Ambulatory Visit: Payer: Self-pay

## 2023-09-11 ENCOUNTER — Other Ambulatory Visit: Payer: Self-pay

## 2023-09-11 ENCOUNTER — Other Ambulatory Visit (HOSPITAL_COMMUNITY): Payer: Self-pay

## 2023-09-16 ENCOUNTER — Other Ambulatory Visit: Payer: Self-pay

## 2023-09-22 ENCOUNTER — Other Ambulatory Visit (HOSPITAL_COMMUNITY): Payer: Self-pay
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
## 2023-09-24 ENCOUNTER — Other Ambulatory Visit: Payer: Self-pay

## 2023-09-24 ENCOUNTER — Other Ambulatory Visit (HOSPITAL_COMMUNITY): Payer: Self-pay

## 2023-09-24 MED ORDER — PREGABALIN 50 MG PO CAPS
50.0000 mg | ORAL_CAPSULE | Freq: Two times a day (BID) | ORAL | 11 refills | Status: AC
  Filled 2023-09-27: qty 60, 30d supply, fill #0
  Filled 2023-10-31 – 2023-11-18 (×2): qty 60, 30d supply, fill #1

## 2023-09-24 MED ORDER — PREDNISONE 20 MG PO TABS
ORAL_TABLET | ORAL | 0 refills | Status: DC
  Filled 2023-09-24: qty 30, 15d supply, fill #0

## 2023-09-26 ENCOUNTER — Other Ambulatory Visit: Payer: Self-pay | Admitting: Psychiatry

## 2023-09-26 ENCOUNTER — Other Ambulatory Visit: Payer: Self-pay

## 2023-09-27 ENCOUNTER — Other Ambulatory Visit (HOSPITAL_COMMUNITY): Payer: Self-pay

## 2023-09-29 ENCOUNTER — Encounter: Payer: Self-pay | Admitting: Psychiatry

## 2023-09-29 ENCOUNTER — Ambulatory Visit (INDEPENDENT_AMBULATORY_CARE_PROVIDER_SITE_OTHER): Admitting: Psychiatry

## 2023-09-29 ENCOUNTER — Other Ambulatory Visit: Payer: Self-pay

## 2023-09-29 ENCOUNTER — Other Ambulatory Visit (HOSPITAL_COMMUNITY): Payer: Self-pay

## 2023-09-29 VITALS — BP 118/74 | HR 85 | Temp 98.4°F | Ht <= 58 in | Wt 197.2 lb

## 2023-09-29 DIAGNOSIS — F250 Schizoaffective disorder, bipolar type: Secondary | ICD-10-CM

## 2023-09-29 DIAGNOSIS — F4310 Post-traumatic stress disorder, unspecified: Secondary | ICD-10-CM | POA: Diagnosis not present

## 2023-09-29 DIAGNOSIS — F1290 Cannabis use, unspecified, uncomplicated: Secondary | ICD-10-CM | POA: Diagnosis not present

## 2023-09-29 DIAGNOSIS — Z79899 Other long term (current) drug therapy: Secondary | ICD-10-CM | POA: Diagnosis not present

## 2023-09-29 MED ORDER — DIVALPROEX SODIUM ER 250 MG PO TB24
250.0000 mg | ORAL_TABLET | Freq: Every day | ORAL | 1 refills | Status: DC
  Filled 2023-09-29 – 2023-10-07 (×2): qty 30, 30d supply, fill #0
  Filled 2023-11-05: qty 30, 30d supply, fill #1

## 2023-09-29 NOTE — Patient Instructions (Signed)
 Start depakote  ER 250 mg daily Continue latuda  80 mg daily  Continue sertraline  100 mg daily  Obtain lab after week of taking Depakote  (VPA, CBC, LFT) Next appointment: 8/26 at 2:30

## 2023-10-02 ENCOUNTER — Other Ambulatory Visit (INDEPENDENT_AMBULATORY_CARE_PROVIDER_SITE_OTHER): Payer: Self-pay | Admitting: Nurse Practitioner

## 2023-10-02 ENCOUNTER — Other Ambulatory Visit: Payer: Self-pay

## 2023-10-02 DIAGNOSIS — N186 End stage renal disease: Secondary | ICD-10-CM

## 2023-10-03 ENCOUNTER — Other Ambulatory Visit (INDEPENDENT_AMBULATORY_CARE_PROVIDER_SITE_OTHER)

## 2023-10-03 DIAGNOSIS — N186 End stage renal disease: Secondary | ICD-10-CM | POA: Diagnosis not present

## 2023-10-07 ENCOUNTER — Other Ambulatory Visit: Payer: Self-pay

## 2023-10-07 ENCOUNTER — Other Ambulatory Visit: Payer: Self-pay | Admitting: Psychiatry

## 2023-10-07 ENCOUNTER — Telehealth: Payer: Self-pay

## 2023-10-07 ENCOUNTER — Other Ambulatory Visit (HOSPITAL_COMMUNITY): Payer: Self-pay

## 2023-10-07 MED ORDER — AMLODIPINE BESYLATE 10 MG PO TABS
10.0000 mg | ORAL_TABLET | Freq: Every day | ORAL | 1 refills | Status: DC
  Filled 2023-10-07 (×2): qty 30, 30d supply, fill #0
  Filled 2023-11-05: qty 30, 30d supply, fill #1
  Filled 2023-12-09 – 2023-12-15 (×2): qty 30, 30d supply, fill #2
  Filled 2024-01-08 – 2024-01-15 (×3): qty 30, 30d supply, fill #3
  Filled 2024-01-29 – 2024-02-09 (×2): qty 30, 30d supply, fill #4
  Filled 2024-03-12: qty 30, 30d supply, fill #5

## 2023-10-07 MED ORDER — SERTRALINE HCL 100 MG PO TABS
100.0000 mg | ORAL_TABLET | Freq: Every day | ORAL | 0 refills | Status: DC
  Filled 2023-10-07 (×2): qty 30, 30d supply, fill #0
  Filled 2023-11-05: qty 30, 30d supply, fill #1
  Filled 2023-12-09 – 2023-12-15 (×2): qty 30, 30d supply, fill #2

## 2023-10-07 MED ORDER — LURASIDONE HCL 80 MG PO TABS
80.0000 mg | ORAL_TABLET | Freq: Every day | ORAL | 3 refills | Status: DC
  Filled 2023-10-07: qty 30, 30d supply, fill #0
  Filled 2023-11-05: qty 30, 30d supply, fill #1

## 2023-10-07 NOTE — Telephone Encounter (Signed)
 Dorn with [facility] called requesting a new script for the patients lurasidone  (LATUDA ) 80 MG TABS tablet  please advise    Last visit 09-29-23 Next visit 11-04-23

## 2023-10-07 NOTE — Telephone Encounter (Signed)
 Ordered

## 2023-10-08 ENCOUNTER — Other Ambulatory Visit: Payer: Self-pay

## 2023-10-09 ENCOUNTER — Other Ambulatory Visit: Payer: Self-pay

## 2023-10-09 ENCOUNTER — Ambulatory Visit (INDEPENDENT_AMBULATORY_CARE_PROVIDER_SITE_OTHER): Payer: Medicaid Other | Admitting: Nurse Practitioner

## 2023-10-09 ENCOUNTER — Encounter (INDEPENDENT_AMBULATORY_CARE_PROVIDER_SITE_OTHER): Payer: Medicaid Other

## 2023-10-10 ENCOUNTER — Other Ambulatory Visit: Payer: Self-pay

## 2023-10-12 NOTE — Progress Notes (Signed)
 MRN : [id number]  Darlene Barber is a 58 y.o. ([birth year]) female who presents with chief complaint of check access.  History of Present Illness:   The patient is a 58 year old female who returns today for evaluation of her left brachial axillary AV graft. She notes that she has numbness and discomfort in her hand. However her hand was numb prior to her surgery. She notes that she feels as if it is worsening somewhat but it is more so present in 2 fingers versus the whole hand. Hand is also somewhat swollen today but she also has a rash which she has recently been on steroids. Currently no open wounds or ulcerations. The surgical site is well-healed. Her AV graft today appears to be patent with a flow volume of 1064 cc/min  No outpatient medications have been marked as taking for the 10/13/23 encounter (Appointment) with Jama, Cordella MATSU, MD.   Current Facility-Administered Medications for the 10/13/23 encounter (Appointment) with Jama, Cordella MATSU, MD  Medication   lidocaine  HCl (PF) (XYLOCAINE ) 2 % injection 50 mL    Past Medical History:  Diagnosis Date   Allergy    Anemia    Anxiety    Bursitis of both hips    CKD (chronic kidney disease) stage 4, GFR 15-29 ml/min (HCC)    COPD (chronic obstructive pulmonary disease) (HCC)    Depression    Diabetes mellitus without complication (HCC)    Frequent headaches    GERD (gastroesophageal reflux disease)    Hypertension    Obesity    Sleep apnea    doesn't use CPAP machine broken,     Past Surgical History:  Procedure Laterality Date   AV FISTULA PLACEMENT Left 02/28/2023   Procedure: INSERTION OF ARTERIOVENOUS (AV) GORE-TEX GRAFT ARM (BRACHIAL AXILLARY);  Surgeon: Jama Cordella MATSU, MD;  Location: [facility] ORS;  Service: Vascular;  Laterality: Left;   BIOPSY  03/30/2022   Procedure: BIOPSY;  Surgeon: Leigh Elspeth SQUIBB, MD;  Location: [facility] ENDOSCOPY;  Service: Gastroenterology;;   CESAREAN SECTION     x2    CHOLECYSTECTOMY     COLONOSCOPY WITH PROPOFOL  N/A 11/20/2017   Procedure: COLONOSCOPY WITH PROPOFOL ;  Surgeon: Unk Corinn Skiff, MD;  Location: [facility] ENDOSCOPY;  Service: Gastroenterology;  Laterality: N/A;   COLONOSCOPY WITH PROPOFOL  N/A 03/30/2022   Procedure: COLONOSCOPY WITH PROPOFOL ;  Surgeon: Leigh Elspeth SQUIBB, MD;  Location: [facility] ENDOSCOPY;  Service: Gastroenterology;  Laterality: N/A;   COLONOSCOPY WITH PROPOFOL  N/A 08/02/2022   Procedure: COLONOSCOPY WITH PROPOFOL ;  Surgeon: Unk Corinn Skiff, MD;  Location: [facility] ENDOSCOPY;  Service: Gastroenterology;  Laterality: N/A;   ESOPHAGOGASTRODUODENOSCOPY N/A 08/22/2023   Procedure: EGD (ESOPHAGOGASTRODUODENOSCOPY);  Surgeon: Maryruth Ole DASEN, MD;  Location: [facility] ENDOSCOPY;  Service: Endoscopy;  Laterality: N/A;   ESOPHAGOGASTRODUODENOSCOPY (EGD) WITH PROPOFOL  N/A 03/30/2022   Procedure: ESOPHAGOGASTRODUODENOSCOPY (EGD) WITH PROPOFOL ;  Surgeon: Leigh Elspeth SQUIBB, MD;  Location: [facility] ENDOSCOPY;  Service: Gastroenterology;  Laterality: N/A;   HYSTERECTOMY ABDOMINAL WITH SALPINGECTOMY  1998   HYSTEROSCOPY     LAPAROSCOPIC SUBTOTAL COLECTOMY Right 08/03/2022   Procedure: HAND ASSISTED LAPAROSCOPIC SUBTOTAL COLECTOMY;  Surgeon: Jordis Laneta FALCON, MD;  Location: [facility] ORS;  Service: General;  Laterality: Right;    Social History Social History   Tobacco Use   Smoking status: Former    Current packs/day: 1.00    Average packs/day: 1 pack/day for 30.0 years (30.0  ttl pk-yrs)    Types: Cigarettes, Cigars    Passive exposure: Past   Smokeless tobacco: Never  Vaping Use   Vaping status: Some Days   Substances: Nicotine , Flavoring  Substance Use Topics   Alcohol use: Not Currently    Comment: occ once per year   Drug use: Yes    Types: Marijuana    Comment: Current use    Family History Family History  Problem Relation Age of Onset   Hodgkin's lymphoma Mother    Diabetes Maternal Aunt    Diabetes Maternal Uncle    Alzheimer's  disease Maternal Grandfather    Schizophrenia Maternal Grandmother    Heart failure Maternal Grandmother    Emphysema Paternal Grandmother    Cancer Other    Breast cancer Neg Hx     Allergies  Allergen Reactions   Other Anaphylaxis, Itching, Swelling and Other (See Comments)    Black pepper   Black Pepper-Turmeric    Latex Rash   Silicone Rash   Tape Rash and Other (See Comments)    Paper tape is ok to use.     REVIEW OF SYSTEMS (Negative unless checked)  Constitutional: [] Weight loss  [] Fever  [] Chills Cardiac: [] Chest pain   [] Chest pressure   [] Palpitations   [] Shortness of breath when laying flat   [] Shortness of breath with exertion. Vascular:  [] Pain in legs with walking   [] Pain in legs at rest  [] History of DVT   [] Phlebitis   [] Swelling in legs   [] Varicose veins   [] Non-healing ulcers Pulmonary:   [] Uses home oxygen   [] Productive cough   [] Hemoptysis   [] Wheeze  [] COPD   [] Asthma Neurologic:  [] Dizziness   [] Seizures   [] History of stroke   [] History of TIA  [] Aphasia   [] Vissual changes   [] Weakness or numbness in arm   [] Weakness or numbness in leg Musculoskeletal:   [] Joint swelling   [] Joint pain   [] Low back pain Hematologic:  [] Easy bruising  [] Easy bleeding   [] Hypercoagulable state   [] Anemic Gastrointestinal:  [] Diarrhea   [] Vomiting  [] Gastroesophageal reflux/heartburn   [] Difficulty swallowing. Genitourinary:  [x] Chronic kidney disease   [] Difficult urination  [] Frequent urination   [] Blood in urine Skin:  [] Rashes   [] Ulcers  Psychological:  [] History of anxiety   []  History of major depression.  Physical Examination  There were no vitals filed for this visit. There is no height or weight on file to calculate BMI. Gen: WD/WN, NAD Head: [place]/AT, No temporalis wasting.  Ear/Nose/Throat: Hearing grossly intact, nares w/o erythema or drainage Eyes: PER, EOMI, sclera nonicteric.  Neck: Supple, no gross masses or lesions.  No JVD.  Pulmonary:  Good air  movement, no audible wheezing, no use of accessory muscles.  Cardiac: RRR, precordium non-hyperdynamic. Vascular:   Left arm AV access good thrill good bruit Vessel Right Left  Radial Palpable Palpable  Brachial Palpable Palpable  Gastrointestinal: soft, non-distended. No guarding/no peritoneal signs.  Musculoskeletal: M/S 5/5 throughout.  No deformity.  Neurologic: CN 2-12 intact. Pain and light touch intact in extremities.  Symmetrical.  Speech is fluent. Motor exam as listed above. Psychiatric: Judgment intact, Mood & affect appropriate for pt's clinical situation. Dermatologic: No rashes or ulcers noted.  No changes consistent with cellulitis.   CBC Lab Results  Component Value Date   WBC 10.3 08/13/2022   HGB 9.2 (L) 02/28/2023   HCT 27.0 (L) 02/28/2023   MCV 93.1 08/13/2022   PLT 676 (H) 08/13/2022  BMET    Component Value Date/Time   NA 139 02/28/2023 0645   NA 137 04/20/2012 1725   K 4.2 02/28/2023 0645   K 4.2 04/20/2012 1725   CL 107 02/28/2023 0645   CL 106 04/20/2012 1725   CO2 23 08/13/2022 1549   CO2 24 04/20/2012 1725   GLUCOSE 103 (H) 02/28/2023 0645   GLUCOSE 97 04/20/2012 1725   BUN 29 (H) 02/28/2023 0645   BUN 14 04/20/2012 1725   CREATININE 2.60 (H) 02/28/2023 0645   CREATININE 0.83 09/30/2019 1057   CALCIUM  8.6 (L) 08/13/2022 1549   CALCIUM  9.2 04/20/2012 1725   GFRNONAA 26 (L) 08/13/2022 1549   GFRNONAA 81 09/30/2019 1057   GFRAA 93 09/30/2019 1057   CrCl cannot be calculated ([name] most recent lab result is older than the maximum 21 days allowed.).  COAG Lab Results  Component Value Date   INR 1.1 07/31/2022   INR 1.4 (H) 05/29/2022   INR 1.4 (H) 03/31/2022    Radiology VAS US  DUPLEX DIALYSIS ACCESS (AVF, AVG) Result Date: 10/06/2023 DIALYSIS ACCESS Patient Name:  SHAMONIQUE BATTISTE  Date of Exam:   10/03/2023 Medical Rec #: [id number]         Accession #:    [id number] Date of Birth: 12/16/1965        Patient Gender: F Patient Age:    71 years Exam Location:  [facility] Procedure:      VAS US  DUPLEX DIALYSIS ACCESS (AVF, AVG) Referring Phys: ORVIN DARING --------------------------------------------------------------------------------  Access Site: Left Upper Extremity. Access Type: Brachial Axillary AVG. History: 02/28/2023: Left Brachial Axillary AVG placement. Comparison Study: 04/11/2023 Performing Technologist: Leafy Gibes RVS  Examination Guidelines: A complete evaluation includes B-mode imaging, spectral Doppler, color Doppler, and power Doppler as needed of all accessible portions of each vessel. Unilateral testing is considered an integral part of a complete examination. Limited examinations for reoccurring indications may be performed as noted.  Findings:   +--------------------+----------+-----------------+--------+ AVG                 PSV (cm/s)Flow Vol (mL/min)Describe +--------------------+----------+-----------------+--------+ Native artery inflow   106          1064                +--------------------+----------+-----------------+--------+ Arterial anastomosis   100                              +--------------------+----------+-----------------+--------+ Prox graft             124                              +--------------------+----------+-----------------+--------+ Mid graft               58                              +--------------------+----------+-----------------+--------+ Distal graft            55                              +--------------------+----------+-----------------+--------+ Venous anastomosis     140                              +--------------------+----------+-----------------+--------+  Venous outflow          17                              +--------------------+----------+-----------------+--------+ +--------------+-------------+---------+---------+---------+-------------------+               Diameter (cm)  Depth  Branching   PSV        Flow Volume                                  (cm)             (cm/s)       (ml/min)       +--------------+-------------+---------+---------+---------+-------------------+ Lt Rad Art                                      109                       Dist                                                                      +--------------+-------------+---------+---------+---------+-------------------+  Summary: The Left Brachial Axillary AVG appears to be patent thrpoughout; Flow Volume appears to be Normal as Well.  *See table(s) above for measurements and observations.  Diagnosing physician: Cordella Shawl MD Electronically signed by Cordella Shawl MD on 10/06/2023 at 8:33:42 AM.   --------------------------------------------------------------------------------   Final      Assessment/Plan 1. Chronic kidney disease (CKD), stage V (HCC) (Primary) Recommend:  The patient is doing well and currently has adequate dialysis access. The patient's dialysis center is not reporting any access issues. Flow pattern is stable when compared to the prior ultrasound.  The patient should have a duplex ultrasound of the dialysis access in 6 months. The patient will follow-up with me in the office after each ultrasound   - VAS US  DUPLEX DIALYSIS ACCESS (AVF, AVG); Future  2. Hyperlipidemia associated with type 2 diabetes mellitus (HCC) Continue statin as ordered and reviewed, no changes at this time  3. Controlled type 2 diabetes mellitus with complication, without long-term current use of insulin  (HCC) Continue hypoglycemic medications as already ordered, these medications have been reviewed and there are no changes at this time.  Hgb A1C to be monitored as already arranged by primary service  4. Essential hypertension Continue antihypertensive medications as already ordered, these medications have been reviewed and there are no changes at this time.  5. Centrilobular emphysema  (HCC) Continue pulmonary medications and aerosols as already ordered, these medications have been reviewed and there are no changes at this time.     Cordella Shawl, MD  10/12/2023 3:04 PM

## 2023-10-13 ENCOUNTER — Ambulatory Visit (INDEPENDENT_AMBULATORY_CARE_PROVIDER_SITE_OTHER): Admitting: Vascular Surgery

## 2023-10-13 ENCOUNTER — Encounter (INDEPENDENT_AMBULATORY_CARE_PROVIDER_SITE_OTHER): Payer: Self-pay | Admitting: Vascular Surgery

## 2023-10-13 ENCOUNTER — Other Ambulatory Visit (HOSPITAL_COMMUNITY): Payer: Self-pay

## 2023-10-13 VITALS — BP 136/72 | HR 90 | Resp 18 | Ht <= 58 in | Wt 199.0 lb

## 2023-10-13 DIAGNOSIS — N185 Chronic kidney disease, stage 5: Secondary | ICD-10-CM | POA: Diagnosis not present

## 2023-10-13 DIAGNOSIS — E118 Type 2 diabetes mellitus with unspecified complications: Secondary | ICD-10-CM | POA: Diagnosis not present

## 2023-10-13 DIAGNOSIS — I10 Essential (primary) hypertension: Secondary | ICD-10-CM

## 2023-10-13 DIAGNOSIS — E785 Hyperlipidemia, unspecified: Secondary | ICD-10-CM

## 2023-10-13 DIAGNOSIS — E1169 Type 2 diabetes mellitus with other specified complication: Secondary | ICD-10-CM | POA: Diagnosis not present

## 2023-10-13 DIAGNOSIS — J432 Centrilobular emphysema: Secondary | ICD-10-CM

## 2023-10-18 ENCOUNTER — Encounter (INDEPENDENT_AMBULATORY_CARE_PROVIDER_SITE_OTHER): Payer: Self-pay | Admitting: Vascular Surgery

## 2023-10-20 ENCOUNTER — Other Ambulatory Visit (HOSPITAL_COMMUNITY): Payer: Self-pay

## 2023-10-27 ENCOUNTER — Other Ambulatory Visit (HOSPITAL_COMMUNITY): Payer: Self-pay

## 2023-10-27 ENCOUNTER — Other Ambulatory Visit: Payer: Self-pay

## 2023-10-27 MED ORDER — HYDRALAZINE HCL 25 MG PO TABS
ORAL_TABLET | ORAL | 11 refills | Status: AC
  Filled 2023-10-27: qty 90, 30d supply, fill #0
  Filled 2023-11-25: qty 90, 30d supply, fill #1
  Filled 2023-12-31 (×2): qty 54, 18d supply, fill #2
  Filled 2023-12-31: qty 90, 30d supply, fill #2
  Filled 2023-12-31: qty 54, 18d supply, fill #2

## 2023-10-27 MED ORDER — LORATADINE 10 MG PO TABS
10.0000 mg | ORAL_TABLET | Freq: Every day | ORAL | 11 refills | Status: AC
  Filled 2023-11-05: qty 30, 30d supply, fill #0

## 2023-10-28 ENCOUNTER — Other Ambulatory Visit (HOSPITAL_COMMUNITY): Payer: Self-pay

## 2023-10-28 ENCOUNTER — Other Ambulatory Visit: Payer: Self-pay

## 2023-10-30 ENCOUNTER — Other Ambulatory Visit: Payer: Self-pay

## 2023-10-31 ENCOUNTER — Other Ambulatory Visit (HOSPITAL_COMMUNITY): Payer: Self-pay

## 2023-10-31 ENCOUNTER — Other Ambulatory Visit: Payer: Self-pay

## 2023-11-01 NOTE — Progress Notes (Unsigned)
 No show

## 2023-11-04 ENCOUNTER — Ambulatory Visit: Admitting: Psychiatry

## 2023-11-04 DIAGNOSIS — Z91199 Patient's noncompliance with other medical treatment and regimen due to unspecified reason: Secondary | ICD-10-CM

## 2023-11-05 ENCOUNTER — Telehealth (INDEPENDENT_AMBULATORY_CARE_PROVIDER_SITE_OTHER): Payer: Self-pay

## 2023-11-05 ENCOUNTER — Other Ambulatory Visit: Payer: Self-pay

## 2023-11-05 NOTE — Telephone Encounter (Signed)
 Spoke with the patient to schedule her for a left arm graft declot with Dr. Marea. Patient scheduled on 11/20/23 with a 1:00 pm arrival time to the [facility]. Pre-procedure instructions were discussed and will be mailed as the patient states she doesn't use Mychart. Patient was offered 11/17/23 and declined.

## 2023-11-06 ENCOUNTER — Other Ambulatory Visit: Payer: Self-pay

## 2023-11-07 ENCOUNTER — Other Ambulatory Visit: Payer: Self-pay
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
## 2023-11-11 ENCOUNTER — Other Ambulatory Visit (HOSPITAL_COMMUNITY): Payer: Self-pay

## 2023-11-13 ENCOUNTER — Ambulatory Visit (INDEPENDENT_AMBULATORY_CARE_PROVIDER_SITE_OTHER): Admitting: Psychiatry

## 2023-11-13 ENCOUNTER — Other Ambulatory Visit: Payer: Self-pay

## 2023-11-13 ENCOUNTER — Encounter: Payer: Self-pay | Admitting: Psychiatry

## 2023-11-13 ENCOUNTER — Other Ambulatory Visit (HOSPITAL_COMMUNITY): Payer: Self-pay

## 2023-11-13 VITALS — BP 116/68 | HR 81 | Temp 98.8°F | Ht <= 58 in | Wt 201.2 lb

## 2023-11-13 DIAGNOSIS — F250 Schizoaffective disorder, bipolar type: Secondary | ICD-10-CM | POA: Diagnosis not present

## 2023-11-13 DIAGNOSIS — Z79899 Other long term (current) drug therapy: Secondary | ICD-10-CM | POA: Diagnosis not present

## 2023-11-13 DIAGNOSIS — F4310 Post-traumatic stress disorder, unspecified: Secondary | ICD-10-CM

## 2023-11-13 MED ORDER — ZIPRASIDONE HCL 20 MG PO CAPS
ORAL_CAPSULE | ORAL | 1 refills | Status: DC
  Filled 2023-11-13: qty 60, fill #0
  Filled 2023-11-18: qty 60, 34d supply, fill #0
  Filled 2023-12-16: qty 60, 30d supply, fill #1

## 2023-11-13 MED ORDER — LURASIDONE HCL 20 MG PO TABS
ORAL_TABLET | ORAL | 0 refills | Status: DC
  Filled 2023-11-13: qty 42, 21d supply, fill #0
  Filled 2023-11-19: qty 42, 42d supply, fill #0

## 2023-11-13 NOTE — Patient Instructions (Signed)
 Continue depakote  ER 250 mg daily Decrease latuda  60 mg daily for one week, then 40 mg daily for one week, then 20 mg for one week then discontinue  Start ziprasidone  20 mg daily for one week, then 20 mg twice a day Continue sertraline  100 mg daily  Obtain labs at [place] (VPA, CBC, LFT) Next appointment: 10/28 at 3:30

## 2023-11-14 ENCOUNTER — Other Ambulatory Visit: Payer: Self-pay

## 2023-11-14 ENCOUNTER — Encounter: Payer: Self-pay | Admitting: Pharmacist

## 2023-11-18 ENCOUNTER — Other Ambulatory Visit: Payer: Self-pay

## 2023-11-18 ENCOUNTER — Telehealth (INDEPENDENT_AMBULATORY_CARE_PROVIDER_SITE_OTHER): Payer: Self-pay

## 2023-11-18 ENCOUNTER — Other Ambulatory Visit (HOSPITAL_COMMUNITY): Payer: Self-pay

## 2023-11-18 NOTE — Telephone Encounter (Signed)
 Patient left a message stating she return your call

## 2023-11-18 NOTE — Telephone Encounter (Signed)
 I attempted to contact the patient  as the patient left a message about questions regarding medications that she should/should not take. A message was left for a return call.

## 2023-11-19 ENCOUNTER — Other Ambulatory Visit: Payer: Self-pay

## 2023-11-19 ENCOUNTER — Other Ambulatory Visit (HOSPITAL_COMMUNITY): Payer: Self-pay

## 2023-11-20 ENCOUNTER — Other Ambulatory Visit: Payer: Self-pay

## 2023-11-20 ENCOUNTER — Encounter
Admission: RE | Disposition: A | Discharge status: Home / Self Care | Payer: Self-pay | Source: Home / Self Care | Attending: Vascular Surgery

## 2023-11-20 ENCOUNTER — Other Ambulatory Visit (HOSPITAL_COMMUNITY): Payer: Self-pay

## 2023-11-20 ENCOUNTER — Ambulatory Visit
Admission: RE | Admit: 2023-11-20 | Discharge: 2023-11-20 | Disposition: A | Discharge status: Home / Self Care | Payer: Self-pay | Attending: Vascular Surgery | Admitting: Vascular Surgery

## 2023-11-20 ENCOUNTER — Encounter: Payer: Self-pay | Admitting: Vascular Surgery

## 2023-11-20 DIAGNOSIS — Z992 Dependence on renal dialysis: Secondary | ICD-10-CM

## 2023-11-20 DIAGNOSIS — I2510 Atherosclerotic heart disease of native coronary artery without angina pectoris: Secondary | ICD-10-CM | POA: Insufficient documentation

## 2023-11-20 DIAGNOSIS — T82868A Thrombosis of vascular prosthetic devices, implants and grafts, initial encounter: Secondary | ICD-10-CM

## 2023-11-20 DIAGNOSIS — N186 End stage renal disease: Secondary | ICD-10-CM | POA: Diagnosis not present

## 2023-11-20 DIAGNOSIS — T82858A Stenosis of vascular prosthetic devices, implants and grafts, initial encounter: Secondary | ICD-10-CM | POA: Diagnosis not present

## 2023-11-20 DIAGNOSIS — I120 Hypertensive chronic kidney disease with stage 5 chronic kidney disease or end stage renal disease: Secondary | ICD-10-CM | POA: Diagnosis not present

## 2023-11-20 DIAGNOSIS — Y832 Surgical operation with anastomosis, bypass or graft as the cause of abnormal reaction of the patient, or of later complication, without mention of misadventure at the time of the procedure: Secondary | ICD-10-CM | POA: Diagnosis not present

## 2023-11-20 DIAGNOSIS — Z87891 Personal history of nicotine dependence: Secondary | ICD-10-CM | POA: Insufficient documentation

## 2023-11-20 DIAGNOSIS — E1122 Type 2 diabetes mellitus with diabetic chronic kidney disease: Secondary | ICD-10-CM | POA: Diagnosis not present

## 2023-11-20 HISTORY — PX: A/V SHUNT INTERVENTION: CATH118220

## 2023-11-20 LAB — POTASSIUM (ARMC VASCULAR LAB ONLY): Potassium (ARMC vascular lab): 4.2 mmol/L (ref 3.5–5.1)

## 2023-11-20 SURGERY — A/V SHUNT INTERVENTION
Anesthesia: Moderate Sedation

## 2023-11-20 MED ORDER — SODIUM CHLORIDE 0.9 % IV SOLN: INTRAVENOUS | Status: DC

## 2023-11-20 MED ORDER — ONDANSETRON HCL 4 MG/2ML IJ SOLN: 4.0000 mg | Freq: Four times a day (QID) | INTRAMUSCULAR | Status: DC | PRN

## 2023-11-20 MED ORDER — FENTANYL CITRATE (PF) 100 MCG/2ML IJ SOLN
INTRAMUSCULAR | Status: DC | PRN
  Administered 2023-11-20 (×2): 25 ug via INTRAVENOUS
  Administered 2023-11-20: 50 ug via INTRAVENOUS

## 2023-11-20 MED ORDER — ASPIRIN 81 MG PO TBEC
81.0000 mg | DELAYED_RELEASE_TABLET | Freq: Every day | ORAL | 2 refills | Status: AC
  Filled 2023-11-20 (×2): qty 30, 30d supply, fill #0
  Filled 2023-12-09 – 2023-12-15 (×2): qty 30, 30d supply, fill #1
  Filled 2024-01-08 – 2024-01-15 (×3): qty 30, 30d supply, fill #2
  Filled 2024-01-29 – 2024-02-09 (×2): qty 30, 30d supply, fill #3
  Filled 2024-03-12: qty 30, 30d supply, fill #4
  Filled 2024-04-12: qty 30, 30d supply, fill #5

## 2023-11-20 MED ORDER — LIDOCAINE-EPINEPHRINE (PF) 1 %-1:200000 IJ SOLN
INTRAMUSCULAR | Status: DC | PRN
  Administered 2023-11-20: 10 mL

## 2023-11-20 MED ORDER — MIDAZOLAM HCL 2 MG/2ML IJ SOLN
INTRAMUSCULAR | Status: AC
  Filled 2023-11-20: qty 2

## 2023-11-20 MED ORDER — CEFAZOLIN SODIUM-DEXTROSE 1-4 GM/50ML-% IV SOLN
INTRAVENOUS | Status: AC
Start: 2023-11-20 — End: 2023-11-20
  Filled 2023-11-20: qty 50

## 2023-11-20 MED ORDER — IODIXANOL 320 MG/ML IV SOLN
INTRAVENOUS | Status: DC | PRN
  Administered 2023-11-20: 30 mL

## 2023-11-20 MED ORDER — DIPHENHYDRAMINE HCL 50 MG/ML IJ SOLN: 50.0000 mg | Freq: Once | INTRAMUSCULAR | Status: DC | PRN

## 2023-11-20 MED ORDER — HEPARIN SODIUM (PORCINE) 1000 UNIT/ML IJ SOLN
INTRAMUSCULAR | Status: AC
  Filled 2023-11-20: qty 10

## 2023-11-20 MED ORDER — MIDAZOLAM HCL 2 MG/2ML IJ SOLN
INTRAMUSCULAR | Status: DC | PRN
  Administered 2023-11-20 (×2): .5 mg via INTRAVENOUS
  Administered 2023-11-20: 2 mg via INTRAVENOUS

## 2023-11-20 MED ORDER — FENTANYL CITRATE (PF) 100 MCG/2ML IJ SOLN
INTRAMUSCULAR | Status: AC
  Filled 2023-11-20: qty 2

## 2023-11-20 MED ORDER — CEFAZOLIN SODIUM-DEXTROSE 1-4 GM/50ML-% IV SOLN
1.0000 g | INTRAVENOUS | Status: AC
  Administered 2023-11-20: 1 g via INTRAVENOUS

## 2023-11-20 MED ORDER — HEPARIN (PORCINE) IN NACL 1000-0.9 UT/500ML-% IV SOLN
INTRAVENOUS | Status: DC | PRN
  Administered 2023-11-20: 500 mL

## 2023-11-20 MED ORDER — HEPARIN SODIUM (PORCINE) 1000 UNIT/ML IJ SOLN
INTRAMUSCULAR | Status: DC | PRN
  Administered 2023-11-20: 4000 [IU] via INTRAVENOUS

## 2023-11-20 MED ORDER — MIDAZOLAM HCL 2 MG/ML PO SYRP: 8.0000 mg | ORAL_SOLUTION | Freq: Once | ORAL | Status: DC | PRN

## 2023-11-20 MED ORDER — FAMOTIDINE 20 MG PO TABS: 40.0000 mg | ORAL_TABLET | Freq: Once | ORAL | Status: DC | PRN

## 2023-11-20 MED ORDER — METHYLPREDNISOLONE SODIUM SUCC 125 MG IJ SOLR: 125.0000 mg | Freq: Once | INTRAMUSCULAR | Status: DC | PRN

## 2023-11-20 MED ORDER — HYDROMORPHONE HCL 1 MG/ML IJ SOLN: 1.0000 mg | Freq: Once | INTRAMUSCULAR | Status: DC | PRN

## 2023-11-20 SURGICAL SUPPLY — 11 items
BALLOON LUTONIX 7X100X130 (BALLOONS) IMPLANT
BALLOON LUTONIX DCB 5X60X130 (BALLOONS) IMPLANT
CATH EMBOLECTOMY 5FR (BALLOONS) IMPLANT
COVER PROBE ULTRASOUND 5X96 (MISCELLANEOUS) IMPLANT
DEVICE PRESTO INFLATION (MISCELLANEOUS) IMPLANT
DRAPE BRACHIAL (DRAPES) IMPLANT
KIT MICROPUNCTURE VSI 5F STIFF (SHEATH) IMPLANT
PACK ANGIOGRAPHY (CUSTOM PROCEDURE TRAY) ×1 IMPLANT
SHEATH BRITE TIP 6FRX5.5 (SHEATH) IMPLANT
SUT MNCRL AB 4-0 PS2 18 (SUTURE) IMPLANT
WIRE SUPRACORE 190CM (WIRE) IMPLANT

## 2023-11-20 NOTE — Op Note (Signed)
 [place] [facility]    OPERATIVE NOTE   PROCEDURE: 1.  Left brachial artery to axillary vein arteriovenous graft cannulation under ultrasound guidance in both a retrograde and then antegrade fashion crossing 2.  Left arm shuntogram and central venogram 3.  Fogarty embolectomy for thrombus at the arterial anastomosis 4.  Percutaneous transluminal angioplasty of arterial anastomosis with 5 mm diameter by 6 cm length Lutonix drug-coated angioplasty balloon 5.  Percutaneous transluminal angioplasty of the venous anastomosis with 7 mm diameter by 10 cm length Lutonix drug-coated angioplasty balloon  PRE-OPERATIVE DIAGNOSIS: 1. ESRD 2.  Thrombosed left brachial artery to axillary vein arteriovenous graft  POST-OPERATIVE DIAGNOSIS: same as above   SURGEON: Selinda Gu, MD  ANESTHESIA: local with Moderate Conscious Sedation for approximately 42 minutes using 3 mg of Versed  and 100 mcg of Fentanyl   ESTIMATED BLOOD LOSS: 10 cc  FINDING(S): Thrombus at the arterial anastomosis of the graft creating no flow within the graft  SPECIMEN(S):  None  CONTRAST: 30 cc  FLUORO TIME: 2.6 minutes  INDICATIONS: Patient is a 58 y.o.female who presents with a thrombosed left brachial artery to axillary vein arteriovenous graft.  The patient is scheduled for an attempted declot and shuntogram.  The patient is aware the risks include but are not limited to: bleeding, infection, thrombosis of the cannulated access, and possible anaphylactic reaction to the contrast.  The patient is aware of the risks of the procedure and elects to proceed forward.  DESCRIPTION: After full informed written consent was obtained, the patient was brought back to the angiography suite and placed supine upon the angiography table.  The patient was connected to monitoring equipment. Moderate conscious sedation was administered with a face to face encounter with the patient throughout the procedure with my supervision  of the RN administering medicines and monitoring the patient's vital signs, pulse oximetry, telemetry and mental status throughout from the start of the procedure until the patient was taken to the recovery room. The left arm was prepped and draped in the standard fashion for a percutaneous access intervention.  Under ultrasound guidance, the left brachial artery to axillary vein arteriovenous graft was cannulated with a micropuncture needle under direct ultrasound guidance due to the pulseless nature of the graft in both an antegrade and a retrograde fashion crossing, and permanent images were performed.  The microwire was advanced and the needle was exchanged for the a microsheath.  I then upsized to a 6 Fr Sheath and imaging was performed.  Hand injections were completed to image the access including the central venous system. This demonstrated no flow within the AV graft with thrombus at the arterial anastomosis.  Based on the images, this patient will need extensive treatment to salvage the graft. I then gave the patient 4000 units of intravenous heparin .  I then placed a Magic torque wire into the brachial artery from the retrograde sheath and into the axillary vein from the antegrade sheath. An attempt to clear the thrombus at the arterial anastomosis was done with 3 passes of the Fogarty embolectomy balloon.  Stenosis at the arterial anastomosis was seen, and I elected to treat this lesion with a 5 mm diameter by 6 cm length Lutonix angioplasty balloon. This resulted in resolution of the arterial plug, and clearance of the arterial side of the graft.  No significant stenosis was seen in the proximal portion of the graft near the arterial anastomosis after angioplasty.  The arterial outflow was seen to be intact distally. The  retrograde sheath was removed. I then turned my attention to the stenosis at the venous anastomosis that was uncovered.  This was roughly 70%.  I then used a 7 mm diameter by 10 cm  length Lutonix drug-coated angioplasty balloon and inflated this to 10 atm for 1 minute.  Completion imaging showed only about a 10 to 15% residual stenosis at the venous anastomosis after angioplasty.  Based on the completion imaging, no further intervention is necessary.  The wire and balloon were removed from the sheath.  A 4-0 Monocryl purse-string suture was sewn around the sheath.  The sheath was removed while tying down the suture.  A sterile bandage was applied to the puncture site.  COMPLICATIONS: None  CONDITION: Stable   Selinda Gu 11/20/2023 9:30 AM   This note was created with Dragon Medical transcription system. Any errors in dictation are purely unintentional.

## 2023-11-20 NOTE — H&P (Signed)
 [facility] Admission History & Physical  MRN : [id number]  Darlene Barber is a 58 y.o. (12-19-1965) female who presents with chief complaint of No chief complaint on file. SABRA  History of Present Illness: I am asked to evaluate the patient by the dialysis center. The patient was sent here because they were unable to achieve adequate dialysis this morning. Furthermore the Center states there is very poor thrill and bruit. The patient states there there have been increasing problems with the access, such as pulling clots during dialysis and prolonged bleeding after decannulation. The patient estimates these problems have been going on for several weeks. The patient is unaware of any other change.   Patient denies pain or tenderness overlying the access.  There is no pain with dialysis.  The patient denies hand pain or finger pain consistent with steal syndrome.    There have not been any past interventions or declots of this access.  The patient is not chronically hypotensive on dialysis.  Current Facility-Administered Medications  Medication Dose Route Frequency Provider Last Rate Last Admin   0.9 %  sodium chloride  infusion   Intravenous Continuous Brown, Fallon E, NP       ceFAZolin  (ANCEF ) IVPB 1 g/50 mL premix  1 g Intravenous 30 min Pre-Op  Brown, Fallon E, NP 100 mL/hr at 11/20/23 0840 1 g at 11/20/23 0840   diphenhydrAMINE  (BENADRYL ) injection 50 mg  50 mg Intravenous Once PRN Brown, Fallon E, NP       famotidine  (PEPCID ) tablet 40 mg  40 mg Oral Once PRN Brown, Fallon E, NP       fentaNYL  (SUBLIMAZE ) injection    PRN Barnard Sharps S, MD   50 mcg at 11/20/23 9161   Heparin  (Porcine) in NaCl 1000-0.9 UT/500ML-% SOLN    PRN Marea Selinda RAMAN, MD   500 mL at 11/20/23 9161   HYDROmorphone  (DILAUDID ) injection 1 mg  1 mg Intravenous Once PRN Brown, Fallon E, NP       lidocaine -EPINEPHrine  (PF) (XYLOCAINE -EPINEPHrine ) 1 %-1:200000 (PF) injection    PRN Hagen Tidd S, MD   10  mL at 11/20/23 9161   methylPREDNISolone  sodium succinate (SOLU-MEDROL ) 125 mg/2 mL injection 125 mg  125 mg Intravenous Once PRN Brown, Fallon E, NP       midazolam  (VERSED ) 2 MG/ML syrup 8 mg  8 mg Oral Once PRN Brown, Fallon E, NP       midazolam  (VERSED ) injection    PRN Corianne Buccellato S, MD   2 mg at 11/20/23 9161   ondansetron  (ZOFRAN ) injection 4 mg  4 mg Intravenous Q6H PRN Brown, Fallon E, NP        Past Medical History:  Diagnosis Date   Allergy    Anemia    Anxiety    Bursitis of both hips    CKD (chronic kidney disease) stage 4, GFR 15-29 ml/min (HCC)    COPD (chronic obstructive pulmonary disease) (HCC)    Depression    Diabetes mellitus without complication (HCC)    Frequent headaches    GERD (gastroesophageal reflux disease)    Hypertension    Obesity    Sleep apnea    doesn't use CPAP machine broken,     Past Surgical History:  Procedure Laterality Date   AV FISTULA PLACEMENT Left 02/28/2023   Procedure: INSERTION OF ARTERIOVENOUS (AV) GORE-TEX GRAFT ARM (BRACHIAL AXILLARY);  Surgeon: Jama Cordella MATSU, MD;  Location: [facility] ORS;  Service: Vascular;  Laterality: Left;  BIOPSY  03/30/2022   Procedure: BIOPSY;  Surgeon: Leigh Elspeth SQUIBB, MD;  Location: [facility] ENDOSCOPY;  Service: Gastroenterology;;   CESAREAN SECTION     x2   CHOLECYSTECTOMY     COLONOSCOPY WITH PROPOFOL  N/A 11/20/2017   Procedure: COLONOSCOPY WITH PROPOFOL ;  Surgeon: Unk Corinn Skiff, MD;  Location: [facility] ENDOSCOPY;  Service: Gastroenterology;  Laterality: N/A;   COLONOSCOPY WITH PROPOFOL  N/A 03/30/2022   Procedure: COLONOSCOPY WITH PROPOFOL ;  Surgeon: Leigh Elspeth SQUIBB, MD;  Location: [facility] ENDOSCOPY;  Service: Gastroenterology;  Laterality: N/A;   COLONOSCOPY WITH PROPOFOL  N/A 08/02/2022   Procedure: COLONOSCOPY WITH PROPOFOL ;  Surgeon: Unk Corinn Skiff, MD;  Location: [facility] ENDOSCOPY;  Service: Gastroenterology;  Laterality: N/A;   ESOPHAGOGASTRODUODENOSCOPY N/A 08/22/2023   Procedure: EGD  (ESOPHAGOGASTRODUODENOSCOPY);  Surgeon: Maryruth Ole DASEN, MD;  Location: [facility] ENDOSCOPY;  Service: Endoscopy;  Laterality: N/A;   ESOPHAGOGASTRODUODENOSCOPY (EGD) WITH PROPOFOL  N/A 03/30/2022   Procedure: ESOPHAGOGASTRODUODENOSCOPY (EGD) WITH PROPOFOL ;  Surgeon: Leigh Elspeth SQUIBB, MD;  Location: [facility] ENDOSCOPY;  Service: Gastroenterology;  Laterality: N/A;   HYSTERECTOMY ABDOMINAL WITH SALPINGECTOMY  1998   HYSTEROSCOPY     LAPAROSCOPIC SUBTOTAL COLECTOMY Right 08/03/2022   Procedure: HAND ASSISTED LAPAROSCOPIC SUBTOTAL COLECTOMY;  Surgeon: Jordis Laneta FALCON, MD;  Location: [facility] ORS;  Service: General;  Laterality: Right;    Social History   Tobacco Use   Smoking status: Former    Current packs/day: 1.00    Average packs/day: 1 pack/day for 30.0 years (30.0 ttl pk-yrs)    Types: Cigarettes, Cigars    Passive exposure: Past   Smokeless tobacco: Never  Vaping Use   Vaping status: Former   Substances: Nicotine , Flavoring  Substance Use Topics   Alcohol use: Not Currently    Comment: occ once per year   Drug use: Yes    Types: Marijuana    Comment: Current use     Family History  Problem Relation Age of Onset   Hodgkin's lymphoma Mother    Diabetes Maternal Aunt    Diabetes Maternal Uncle    Alzheimer's disease Maternal Grandfather    Schizophrenia Maternal Grandmother    Heart failure Maternal Grandmother    Emphysema Paternal Grandmother    Cancer Other    Breast cancer Neg Hx     No family history of bleeding or clotting disorders, autoimmune disease or porphyria  Allergies  Allergen Reactions   Other Anaphylaxis, Itching, Swelling and Other (See Comments)    Black pepper   Black Pepper-Turmeric    Latex Rash   Silicone Rash   Tape Rash and Other (See Comments)    Paper tape is ok to use.     REVIEW OF SYSTEMS (Negative unless checked)  Constitutional: [] Weight loss  [] Fever  [] Chills Cardiac: [] Chest pain   [] Chest pressure   [] Palpitations   [] Shortness of  breath when laying flat   [] Shortness of breath at rest   [x] Shortness of breath with exertion. Vascular:  [] Pain in legs with walking   [] Pain in legs at rest   [] Pain in legs when laying flat   [] Claudication   [] Pain in feet when walking  [] Pain in feet at rest  [] Pain in feet when laying flat   [] History of DVT   [] Phlebitis   [] Swelling in legs   [] Varicose veins   [] Non-healing ulcers Pulmonary:   [] Uses home oxygen   [] Productive cough   [] Hemoptysis   [] Wheeze  [x] COPD   [] Asthma Neurologic:  [] Dizziness  [] Blackouts   [] Seizures   []   History of stroke   [] History of TIA  [] Aphasia   [] Temporary blindness   [] Dysphagia   [] Weakness or numbness in arms   [] Weakness or numbness in legs Musculoskeletal:  [] Arthritis   [] Joint swelling   [x] Joint pain   [] Low back pain Hematologic:  [] Easy bruising  [] Easy bleeding   [] Hypercoagulable state   [x] Anemic  [] Hepatitis Gastrointestinal:  [] Blood in stool   [] Vomiting blood  [x] Gastroesophageal reflux/heartburn   [] Difficulty swallowing. Genitourinary:  [x] Chronic kidney disease   [] Difficult urination  [] Frequent urination  [] Burning with urination   [] Blood in urine Skin:  [] Rashes   [] Ulcers   [] Wounds Psychological:  [] History of anxiety   []  History of major depression.  Physical Examination  Vitals:   11/20/23 0818  BP: 119/74  Pulse: 66  Resp: 11  Temp: (!) 97.2 F (36.2 C)  TempSrc: Temporal  SpO2: 96%  Weight: 89.4 kg  Height: 4' 10 (1.473 m)   Body mass index is 41.17 kg/m. Gen: WD/WN, NAD Head: [place]/AT, No temporalis wasting. Ear/Nose/Throat: Hearing grossly intact, nares w/o erythema or drainage, oropharynx w/o Erythema/Exudate,  Eyes: Conjunctiva clear, sclera non-icteric Neck: Trachea midline.  No JVD.  Pulmonary:  Good air movement, respirations not labored, no use of accessory muscles.  Cardiac: RRR, normal S1, S2. Vascular: left arm AVG pulsatile Vessel Right Left  Radial Palpable Palpable   Musculoskeletal: M/S  5/5 throughout.  Extremities without ischemic changes.  No deformity or atrophy.  Neurologic: Sensation grossly intact in extremities.  Symmetrical.  Speech is fluent. Motor exam as listed above. Psychiatric: Judgment intact, Mood & affect appropriate for pt's clinical situation. Dermatologic: No rashes or ulcers noted.  No cellulitis or open wounds.    CBC Lab Results  Component Value Date   WBC 10.3 08/13/2022   HGB 9.2 (L) 02/28/2023   HCT 27.0 (L) 02/28/2023   MCV 93.1 08/13/2022   PLT 676 (H) 08/13/2022    BMET    Component Value Date/Time   NA 139 02/28/2023 0645   NA 137 04/20/2012 1725   K 4.2 02/28/2023 0645   K 4.2 04/20/2012 1725   CL 107 02/28/2023 0645   CL 106 04/20/2012 1725   CO2 23 08/13/2022 1549   CO2 24 04/20/2012 1725   GLUCOSE 103 (H) 02/28/2023 0645   GLUCOSE 97 04/20/2012 1725   BUN 29 (H) 02/28/2023 0645   BUN 14 04/20/2012 1725   CREATININE 2.60 (H) 02/28/2023 0645   CREATININE 0.83 09/30/2019 1057   CALCIUM  8.6 (L) 08/13/2022 1549   CALCIUM  9.2 04/20/2012 1725   GFRNONAA 26 (L) 08/13/2022 1549   GFRNONAA 81 09/30/2019 1057   GFRAA 93 09/30/2019 1057   CrCl cannot be calculated ([name] most recent lab result is older than the maximum 21 days allowed.).  COAG Lab Results  Component Value Date   INR 1.1 07/31/2022   INR 1.4 (H) 05/29/2022   INR 1.4 (H) 03/31/2022    Radiology No results found.  Assessment/Plan 1.  Complication dialysis device with dysfunction AV access:  [name] dialysis access is malfunctioning. The patient will undergo angiography and correction of any problems using interventional techniques with the hope of restoring function to the access.  The risks and benefits were described to the patient.  All questions were answered.  The patient agrees to proceed with angiography and intervention. Potassium will be drawn to ensure that it is an appropriate level prior to performing intervention. 2.  End-stage renal  disease requiring hemodialysis:  Patient  will continue dialysis therapy without further interruption if a successful intervention is not achieved then a tunneled catheter will be placed. Dialysis has already been arranged. 3.  Hypertension:  Patient will continue medical management; nephrology is following no changes in oral medications. 4. Diabetes mellitus:  Glucose will be monitored and oral medications been held this morning once the patient has undergone the patient's procedure po intake will be reinitiated and again Accu-Cheks will be used to assess the blood glucose level and treat as needed. The patient will be restarted on the patient's usual hypoglycemic regime 5.  Coronary artery disease:  EKG will be monitored. Nitrates will be used if needed. The patient's oral cardiac medications will be continued.    Selinda Gu, MD  11/20/2023 8:41 AM

## 2023-11-21 ENCOUNTER — Other Ambulatory Visit (HOSPITAL_COMMUNITY): Payer: Self-pay

## 2023-11-25 ENCOUNTER — Other Ambulatory Visit: Payer: Self-pay

## 2023-11-25 ENCOUNTER — Other Ambulatory Visit: Payer: Self-pay | Admitting: Psychiatry

## 2023-11-25 ENCOUNTER — Other Ambulatory Visit (HOSPITAL_COMMUNITY): Payer: Self-pay

## 2023-11-25 NOTE — Telephone Encounter (Signed)
 Could you advise the patient to obtain labs at [facility], as previously discussed, to safely continue treatment with Depakote ? Will plan to order after obtaining these (she should have several days of medication left)

## 2023-11-26 ENCOUNTER — Other Ambulatory Visit (HOSPITAL_COMMUNITY): Payer: Self-pay

## 2023-11-27 ENCOUNTER — Other Ambulatory Visit (HOSPITAL_COMMUNITY): Payer: Self-pay

## 2023-11-27 ENCOUNTER — Other Ambulatory Visit: Payer: Self-pay | Admitting: Psychiatry

## 2023-11-28 ENCOUNTER — Other Ambulatory Visit: Payer: Self-pay

## 2023-11-28 ENCOUNTER — Other Ambulatory Visit (HOSPITAL_COMMUNITY): Payer: Self-pay

## 2023-11-28 ENCOUNTER — Other Ambulatory Visit: Payer: Self-pay | Admitting: Psychiatry

## 2023-12-01 ENCOUNTER — Other Ambulatory Visit: Payer: Self-pay

## 2023-12-03 ENCOUNTER — Other Ambulatory Visit: Payer: Self-pay

## 2023-12-03 ENCOUNTER — Other Ambulatory Visit (HOSPITAL_COMMUNITY): Payer: Self-pay

## 2023-12-03 MED ORDER — NORTRIPTYLINE HCL 25 MG PO CAPS
ORAL_CAPSULE | ORAL | 1 refills | Status: AC
  Filled 2023-12-03: qty 270, 90d supply, fill #0
  Filled 2024-04-02: qty 270, 90d supply, fill #1

## 2023-12-09 ENCOUNTER — Other Ambulatory Visit: Payer: Self-pay | Admitting: Psychiatry

## 2023-12-09 ENCOUNTER — Other Ambulatory Visit: Payer: Self-pay

## 2023-12-09 ENCOUNTER — Other Ambulatory Visit (HOSPITAL_COMMUNITY): Payer: Self-pay

## 2023-12-10 ENCOUNTER — Other Ambulatory Visit: Payer: Self-pay

## 2023-12-11 ENCOUNTER — Other Ambulatory Visit: Payer: Self-pay

## 2023-12-11 ENCOUNTER — Encounter (HOSPITAL_COMMUNITY): Payer: Self-pay

## 2023-12-11 ENCOUNTER — Other Ambulatory Visit (HOSPITAL_COMMUNITY): Payer: Self-pay

## 2023-12-12 ENCOUNTER — Other Ambulatory Visit: Payer: Self-pay

## 2023-12-12 ENCOUNTER — Other Ambulatory Visit (HOSPITAL_COMMUNITY): Payer: Self-pay

## 2023-12-15 ENCOUNTER — Other Ambulatory Visit: Payer: Self-pay | Admitting: Psychiatry

## 2023-12-15 ENCOUNTER — Other Ambulatory Visit: Payer: Self-pay

## 2023-12-16 ENCOUNTER — Other Ambulatory Visit: Payer: Self-pay

## 2023-12-16 ENCOUNTER — Other Ambulatory Visit (HOSPITAL_COMMUNITY): Payer: Self-pay

## 2023-12-17 ENCOUNTER — Other Ambulatory Visit: Payer: Self-pay

## 2023-12-17 ENCOUNTER — Other Ambulatory Visit (HOSPITAL_COMMUNITY): Payer: Self-pay

## 2023-12-17 MED ORDER — FLUCONAZOLE 100 MG PO TABS
ORAL_TABLET | ORAL | 0 refills | Status: DC
  Filled 2023-12-17: qty 7, 7d supply, fill #0

## 2023-12-17 MED ORDER — NYSTATIN 100000 UNIT/GM EX CREA
TOPICAL_CREAM | CUTANEOUS | 2 refills | Status: AC
  Filled 2023-12-17: qty 30, 30d supply, fill #0

## 2023-12-18 ENCOUNTER — Other Ambulatory Visit (HOSPITAL_COMMUNITY): Payer: Self-pay

## 2023-12-18 MED ORDER — OMEPRAZOLE 40 MG PO CPDR
40.0000 mg | DELAYED_RELEASE_CAPSULE | Freq: Two times a day (BID) | ORAL | 1 refills | Status: AC | PRN
  Filled 2023-12-18: qty 180, 90d supply, fill #0
  Filled 2024-01-28: qty 60, 30d supply, fill #0
  Filled 2024-02-09 – 2024-02-25 (×2): qty 60, 30d supply, fill #1
  Filled 2024-04-02: qty 60, 30d supply, fill #2

## 2023-12-22 ENCOUNTER — Encounter (INDEPENDENT_AMBULATORY_CARE_PROVIDER_SITE_OTHER)

## 2023-12-22 ENCOUNTER — Ambulatory Visit (INDEPENDENT_AMBULATORY_CARE_PROVIDER_SITE_OTHER): Admitting: Vascular Surgery

## 2023-12-23 ENCOUNTER — Other Ambulatory Visit: Payer: Self-pay

## 2023-12-23 ENCOUNTER — Other Ambulatory Visit (HOSPITAL_COMMUNITY): Payer: Self-pay

## 2023-12-23 MED ORDER — ATROVENT HFA 17 MCG/ACT IN AERS
2.0000 | INHALATION_SPRAY | Freq: Three times a day (TID) | RESPIRATORY_TRACT | 12 refills | Status: AC
  Filled 2023-12-23: qty 12.9, 33d supply, fill #0
  Filled 2024-04-02: qty 12.9, 33d supply, fill #1

## 2023-12-30 ENCOUNTER — Other Ambulatory Visit (HOSPITAL_COMMUNITY): Payer: Self-pay

## 2023-12-31 ENCOUNTER — Other Ambulatory Visit: Payer: Self-pay

## 2023-12-31 ENCOUNTER — Other Ambulatory Visit (HOSPITAL_COMMUNITY): Payer: Self-pay

## 2023-12-31 MED ORDER — ROSUVASTATIN CALCIUM 10 MG PO TABS
10.0000 mg | ORAL_TABLET | Freq: Every day | ORAL | 11 refills | Status: AC
  Filled 2023-12-31: qty 17, 17d supply, fill #0
  Filled 2024-01-05: qty 14, 14d supply, fill #0
  Filled 2024-01-08: qty 14, 14d supply, fill #1
  Filled 2024-01-14 – 2024-01-15 (×2): qty 30, 30d supply, fill #1
  Filled 2024-01-29 – 2024-02-09 (×2): qty 30, 30d supply, fill #2
  Filled 2024-03-12: qty 30, 30d supply, fill #3
  Filled 2024-04-12: qty 30, 30d supply, fill #4

## 2023-12-31 MED ORDER — PREGABALIN 50 MG PO CAPS
50.0000 mg | ORAL_CAPSULE | Freq: Two times a day (BID) | ORAL | 11 refills | Status: AC
  Filled 2023-12-31: qty 60, 30d supply, fill #0
  Filled 2024-01-29: qty 60, 30d supply, fill #1
  Filled 2024-02-26: qty 60, 30d supply, fill #2
  Filled 2024-04-02: qty 60, 30d supply, fill #3

## 2023-12-31 MED ORDER — FLUCONAZOLE 100 MG PO TABS
100.0000 mg | ORAL_TABLET | Freq: Every day | ORAL | 0 refills | Status: AC
  Filled 2023-12-31: qty 7, 7d supply, fill #0

## 2023-12-31 MED ORDER — HYDROXYZINE HCL 25 MG PO TABS
25.0000 mg | ORAL_TABLET | Freq: Three times a day (TID) | ORAL | 0 refills | Status: AC | PRN
  Filled 2023-12-31: qty 30, 10d supply, fill #0

## 2023-12-31 NOTE — Progress Notes (Signed)
 Chief Complaint:   Chief Complaint  Patient presents with  . Annual Exam    Subjective:   Darlene Barber is a 58 y.o. female in today for her annual physical exam.  Current Outpatient Medications  Medication Sig Dispense Refill  . acyclovir  (ZOVIRAX ) 400 MG tablet Take 1 tablet (400 mg total) by mouth every 8 (eight) hours 180 tablet 5  . amLODIPine  (NORVASC ) 10 MG tablet Take 1 tablet (10 mg total) by mouth once daily 90 tablet 1  . doxylamine -DM-acetaminophen  (CORICIDIN HBP COLD-MULTI SYMPT) 6.25-15-325 mg/15 mL Liqd Take 30 mLs by mouth once daily 236 mL 0  . fexofenadine  (ALLEGRA ) 180 MG tablet Take 1 tablet (180 mg total) by mouth once daily 30 tablet 11  . fluticasone  propionate (FLONASE ) 50 mcg/actuation nasal spray Place 2 sprays into both nostrils daily. 16 g 5  . galcanezumab -gnlm (EMGALITY  PEN) 120 mg/mL PnIj Inject 1 mL subcutaneously monthly 1 mL 11  . ipratropium (ATROVENT  HFA) inhaler Inhale 2 inhalations into the lungs 3 (three) times daily 12.9 g 12  . loratadine  (CLARITIN ) 10 mg tablet Take 1 tablet (10 mg total) by mouth once daily 30 tablet 11  . metoprolol  tartrate (LOPRESSOR ) 25 MG tablet Take 1 tablet (25 mg total) by mouth 2 (two) times daily 180 tablet 0  . nortriptyline  (PAMELOR ) 25 MG capsule Take 1 capsule (25 mg total) by mouth every morning AND 2 capsules (50 mg total) at bedtime. 270 capsule 1  . nystatin (MYCOSTATIN) 100,000 unit/gram cream Apply topically 3 (three) times daily 30 g 2  . omeprazole  (PRILOSEC) 40 MG DR capsule Take 1 capsule (40 mg total) by mouth 2 (two) times daily as needed. 180 capsule 1  . ondansetron  (ZOFRAN -ODT) 4 MG disintegrating tablet Take 1 tablet (4 mg total) by mouth every 8 (eight) hours as needed for up to 40 days 20 tablet 3  . pregabalin  (LYRICA ) 50 MG capsule Take 1 capsule (50 mg total) by mouth 2 (two) times daily 60 capsule 11  . sertraline  (ZOLOFT ) 100 MG tablet Take 1 tablet (100 mg total) by mouth at bedtime. 90  tablet 0  . sucralfate  (CARAFATE ) 1 gram tablet Take 1 tablet (1 g total) by mouth 2 (two) times daily before meals. 180 tablet 2  . traZODone  (DESYREL ) 150 MG tablet Take 1 tablet (150 mg total) by mouth at bedtime. 30 tablet 5  . VENTOLIN  HFA 90 mcg/actuation inhaler Inhale 2 puffs into the lungs every 4 (four) hours as needed. 18 g 1  . albuterol  (PROAIR  RESPICLICK) 90 mcg/actuation inhaler Inhale 2 inhalations into the lungs 4 (four) times daily 1 each 1  . fluconazole (DIFLUCAN) 100 MG tablet Take 1 tablet (100 mg total) by mouth once daily for 7 days 7 tablet 0  . hydrOXYzine  (ATARAX ) 25 MG tablet Take 1 tablet (25 mg total) by mouth 3 (three) times daily as needed for Itching for up to 10 days 30 tablet 0  . rosuvastatin (CRESTOR) 10 MG tablet Take 1 tablet (10 mg total) by mouth once daily 30 tablet 11   No current facility-administered medications for this visit.    Allergies as of 12/31/2023 - Reviewed 12/31/2023  Allergen Reaction Noted  . Other Anaphylaxis, Itching, Other (See Comments), and Swelling 07/15/2022  . Adhesive Unknown   . Black pepper Other (See Comments) 11/30/2013  . Adhesive tape-silicones Rash 01/31/2014  . Latex Rash 07/15/2022  . Silicone Rash 01/31/2014    Past Medical History:  Diagnosis Date  .  Arthritis   . Bipolar disorder (CMS/HHS-HCC)   . Depression   . GERD (gastroesophageal reflux disease)   . Hypertension   . Insomnia   . Migraines   . Schizophrenic disorder (CMS/HHS-HCC)   . Seasonal allergies     Past Surgical History:  Procedure Laterality Date  . UPPER GASTROINTESTINAL ENDOSCOPY  06/15/2009   @ [facility] -   . COLONOSCOPY  06/15/2009  . EGD  12/07/2013   Normal examination, Referred to [facility] for speech and swallow evaluation d/t dysphagia - no repeat per Dr. Jeri  . EGD @ [facility]  08/22/2023   EGD path unremarkable, repeat prn/CTL  . ABDOMINAL HYSTERECTOMY W/ PARTIAL VAGINACTOMY    . CESAREAN SECTION     x 2  . CHOLECYSTECTOMY    .  TONSILLECTOMY       Family History  Problem Relation Name Age of Onset  . Hodgkin's lymphoma Mother    . Lung cancer Mother    . Skin cancer Father    . Breast cancer Maternal Grandmother      Social History:  reports that she has quit smoking. Her smoking use included cigarettes. She has never used smokeless tobacco. She reports current drug use. She reports that she does not drink alcohol.  Results for orders placed or performed in visit on 12/24/23  Comprehensive Metabolic Panel (CMP)  Result Value Ref Range   Glucose 152 (H) 70 - 110 mg/dL   Sodium 863 863 - 854 mmol/L   Potassium 4.7 3.6 - 5.1 mmol/L   Chloride 100 97 - 109 mmol/L   Carbon Dioxide (CO2) 25.1 22.0 - 32.0 mmol/L   Urea Nitrogen (BUN) 44 (H) 7 - 25 mg/dL   Creatinine 3.4 (H) 0.6 - 1.1 mg/dL   Glomerular Filtration Rate (eGFR) 15 (L) >60 mL/min/1.73sq m   Calcium  9.8 8.7 - 10.3 mg/dL   AST  18 8 - 39 U/L   ALT  16 5 - 38 U/L   Alk Phos (alkaline Phosphatase) 115 (H) 34 - 104 U/L   Albumin  4.1 3.5 - 4.8 g/dL   Bilirubin, Total 0.5 0.3 - 1.2 mg/dL   Protein, Total 6.7 6.1 - 7.9 g/dL   A/G Ratio 1.6 1.0 - 5.0 gm/dL  CBC w/auto Differential (5 Part)  Result Value Ref Range   WBC (White Blood Cell Count) 9.5 4.1 - 10.2 10^3/uL   RBC (Red Blood Cell Count) 3.84 (L) 4.04 - 5.48 10^6/uL   Hemoglobin 11.7 (L) 12.0 - 15.0 gm/dL   Hematocrit 64.7 64.9 - 47.0 %   MCV (Mean Corpuscular Volume) 91.7 80.0 - 100.0 fl   MCH (Mean Corpuscular Hemoglobin) 30.5 27.0 - 31.2 pg   MCHC (Mean Corpuscular Hemoglobin Concentration) 33.2 32.0 - 36.0 gm/dL   Platelet Count 681 849 - 450 10^3/uL   RDW-CV (Red Cell Distribution Width) 15.7 (H) 11.6 - 14.8 %   MPV (Mean Platelet Volume) 10.5 9.4 - 12.4 fl   Neutrophils 5.42 1.50 - 7.80 10^3/uL   Lymphocytes 2.70 1.00 - 3.60 10^3/uL   Monocytes 0.65 0.00 - 1.50 10^3/uL   Eosinophils 0.54 0.00 - 0.55 10^3/uL   Basophils 0.10 (H) 0.00 - 0.09 10^3/uL   Neutrophil % 57.1 32.0 - 70.0 %    Lymphocyte % 28.5 10.0 - 50.0 %   Monocyte % 6.8 4.0 - 13.0 %   Eosinophil % 5.7 (H) 1.0 - 5.0 %   Basophil% 1.1 0.0 - 2.0 %   Immature Granulocyte % 0.8 (H) <=0.7 %  Immature Granulocyte Count 0.08 (H) <=0.06 10^3/L  Lipid Panel w/calc LDL  Result Value Ref Range   Cholesterol, Total 281 (H) 100 - 200 mg/dL   Triglyceride 543 (H) 35 - 199 mg/dL   HDL (High Density Lipoprotein) Cholesterol 42.7 35.0 - 85.0 mg/dL   Cholesterol/HDL Ratio 6.6   Urinalysis w/Microscopic  Result Value Ref Range   Color Colorless Colorless, Straw, Light Yellow, Yellow, Dark Yellow   Clarity Clear Clear   Specific Gravity 1.009 1.005 - 1.030   pH, Urine 5.5 5.0 - 8.0   Protein, Urinalysis Negative Negative mg/dL   Glucose, Urinalysis Negative Negative mg/dL   Ketones, Urinalysis Negative Negative mg/dL   Blood, Urinalysis Negative Negative   Nitrite, Urinalysis Negative Negative   Leukocyte Esterase, Urinalysis Negative Negative   Bilirubin, Urinalysis Negative Negative   Urobilinogen, Urinalysis 0.2 0.2 - 1.0 mg/dL   WBC, UA 1 <=5 /hpf   Red Blood Cells, Urinalysis <1 <=3 /hpf   Bacteria, Urinalysis 0-5 0 - 5 /hpf   Squamous Epithelial Cells, Urinalysis 4 /hpf      ROS:  General: No fever, chills or recent illness. No change in weight Skin:   No skin lesions, growths, masses, rashes, pruritus  HEENT: No change in vision or hearing. No pain or difficulty with swallowing Respiratory: No cough or shortness of breath CV:  No chest pain or palpitations GI:  No pain, dyspepsia or change in bowel habits GU:  No dysuria, frequency, or hesitancy MSK:  No joint pain or injury Neurological: No headaches, changes in mental status, loss of sensation or strength Endocrine:  No heat or cold intolerance, polydipsia, polyuria  PHQ 2/9 from today's flowsheet  PHQ-2 PHQ-2 Over the last 2 weeks, how often have you been bothered by any of the following problems? Little interest or pleasure in doing things:  Several days Feeling down, depressed, or hopeless: Several days Patient Health Questionnaire-2 Score: 2  PHQ-9 (if PHQ >=3) PHQ-9 Over the last 2 weeks, how often have you been bothered by any of the following problems? Trouble falling or staying asleep, or sleeping too much: Not at all Feeling tired or having little energy: Several days Poor appetite or overeating: Not at all Feeling bad about yourself - or that you are a failure or have let yourself or your family down: Not at all Trouble concentrating on things, such as reading the newspaper or watching television: Not at all Moving or speaking so slowly that other people could have noticed? Or the opposite - being so fidgety or restless that you have been moving around a lot more than usual.: Not at all Thoughts that you would be better off dead or hurting yourself in some way: Not at all Patient Health Questionnaire-9 Score: 3  Depression Severity and Treatment Recommendations:  0-4= None  5-9= Mild / Treatment: Support, educate to call if worse; return in one month  10-14= Moderate / Treatment: Support, watchful waiting; Antidepressant or Psychotherapy  15-19= Moderately severe / Treatment: Antidepressant OR Psychotherapy  >= 20 = Major depression, severe / Antidepressant AND Psychotherapy   Alcohol Screening: Alcohol Use: Not At Risk (12/31/2023)   AUDIT-C   . Frequency of Alcohol Consumption: Never   . Average Number of Drinks: Patient does not drink   . Frequency of Binge Drinking: Never   Total time spent on alcohol screening was approximately 15 minutes.   Objective:   Body mass index is 42.01 kg/m.  BP 102/80   Pulse 81  Ht 147.3 cm (4' 10)   Wt 91.2 kg (201 lb)   SpO2 97%   BMI 42.01 kg/m   General: WD/WN female, in no acute distress HEENT: Pupils equal and round, EOMI. oral mucosa moist.  Oropharynx clear. Neck: supple, trachea midline; no thyromegaly Respiratory:clear to auscultation.  No dullness to  percussion.  No use of accessory muscles. Cardiac:  Regular rate and rhythm without murmur, gallops, or rubs Vascular: Carotid and radials 2+; distal pulses 2+ Abdominal:soft, nontender, positive bowel sounds.  No organomegaly. Musculoskeletal:  No clubbing, cyanosis or edema.  Full range of motion in upper and lower extremities bilaterally. Neuro: CN grossly intact.  No acute decrease in sensation in the upper and lower extremities bilaterally. Integumental: Moist with no significant rashes or nodules Lymph: no cervical or supraclavicular lymphadenopathy   Assessment/Plan:   Encounter for routine adult physical exam with abnormal findings  (primary encounter diagnosis) Hypertension, essential Chronic obstructive pulmonary disease, unspecified COPD type (CMS/HHS-HCC) OSA on CPAP Bipolar disorder, current episode mixed, mild (CMS/HHS-HCC) Tobacco abuse Chronic kidney disease (CKD), stage IV (severe) (CMS/HHS-HCC) Gastroesophageal reflux disease without esophagitis Encounter for screening mammogram for breast cancer Hyperglycemia Depression screening (Z13.31) Hyperlipidemia, unspecified hyperlipidemia type  Assessment and Plan  1.  Annual physical exam.  Patient is up-to-date with health maintenance.  Reviewed lab results. 2.  Hypertension.  Well-controlled. 3.  COPD.  No recent exacerbation. 3.  Obstructive sleep apnea.  She benefits from CPAP usage. 4.  Bipolar disorder.  She is followed by psychiatry. 5.  Stage IV chronic kidney disease.  She is followed by nephrology. 6.  GERD.  PPI as needed. 7.  Hyperglycemia.  Will check hemoglobin A1c. 8.  Hyperlipidemia.  Started on statin therapy.    Goals     . * Maintain health/healthy lifestyle (pt-stated)       Goals   None            NORLEEN ALM ROWER, MD  Portions of this note were created using dictation software and may contain typographical errors.  *Some images could not be shown.

## 2024-01-01 ENCOUNTER — Other Ambulatory Visit (HOSPITAL_COMMUNITY): Payer: Self-pay

## 2024-01-01 ENCOUNTER — Other Ambulatory Visit: Payer: Self-pay

## 2024-01-01 MED ORDER — HYDRALAZINE HCL 25 MG PO TABS
25.0000 mg | ORAL_TABLET | Freq: Three times a day (TID) | ORAL | 11 refills | Status: AC
  Filled 2024-01-01 – 2024-01-14 (×3): qty 90, 30d supply, fill #0

## 2024-01-01 MED ORDER — HYDRALAZINE HCL 25 MG PO TABS
25.0000 mg | ORAL_TABLET | Freq: Three times a day (TID) | ORAL | 11 refills | Status: AC
  Filled 2024-01-08 – 2024-01-15 (×2): qty 90, 30d supply, fill #0
  Filled 2024-01-29 – 2024-02-09 (×3): qty 90, 30d supply, fill #1
  Filled 2024-03-12: qty 90, 30d supply, fill #2
  Filled 2024-04-12: qty 90, 30d supply, fill #3

## 2024-01-02 ENCOUNTER — Other Ambulatory Visit (HOSPITAL_COMMUNITY): Payer: Self-pay
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
## 2024-01-05 ENCOUNTER — Other Ambulatory Visit (HOSPITAL_BASED_OUTPATIENT_CLINIC_OR_DEPARTMENT_OTHER): Payer: Self-pay

## 2024-01-05 ENCOUNTER — Other Ambulatory Visit: Payer: Self-pay

## 2024-01-06 ENCOUNTER — Other Ambulatory Visit (HOSPITAL_COMMUNITY): Payer: Self-pay

## 2024-01-06 ENCOUNTER — Other Ambulatory Visit: Payer: Self-pay

## 2024-01-06 ENCOUNTER — Encounter: Payer: Self-pay | Admitting: Psychiatry

## 2024-01-06 ENCOUNTER — Ambulatory Visit (INDEPENDENT_AMBULATORY_CARE_PROVIDER_SITE_OTHER): Admitting: Psychiatry

## 2024-01-06 VITALS — BP 117/78 | HR 83 | Temp 96.3°F | Ht <= 58 in | Wt 203.4 lb

## 2024-01-06 DIAGNOSIS — F250 Schizoaffective disorder, bipolar type: Secondary | ICD-10-CM | POA: Diagnosis not present

## 2024-01-06 DIAGNOSIS — F4310 Post-traumatic stress disorder, unspecified: Secondary | ICD-10-CM

## 2024-01-06 DIAGNOSIS — R635 Abnormal weight gain: Secondary | ICD-10-CM | POA: Diagnosis not present

## 2024-01-06 MED ORDER — ACYCLOVIR 400 MG PO TABS
400.0000 mg | ORAL_TABLET | Freq: Three times a day (TID) | ORAL | 0 refills | Status: AC
  Filled 2024-01-06: qty 180, 60d supply, fill #0

## 2024-01-06 MED ORDER — DIVALPROEX SODIUM ER 250 MG PO TB24
250.0000 mg | ORAL_TABLET | Freq: Every day | ORAL | 1 refills | Status: DC
  Filled 2024-01-06: qty 30, 30d supply, fill #0
  Filled 2024-01-29: qty 30, 30d supply, fill #1

## 2024-01-06 MED ORDER — ACYCLOVIR 400 MG PO TABS
400.0000 mg | ORAL_TABLET | Freq: Three times a day (TID) | ORAL | 0 refills | Status: AC
  Filled 2024-02-09: qty 90, 30d supply, fill #0
  Filled 2024-02-25: qty 180, 60d supply, fill #0
  Filled 2024-03-02: qty 42, 14d supply, fill #0

## 2024-01-06 MED ORDER — ZIPRASIDONE HCL 20 MG PO CAPS
20.0000 mg | ORAL_CAPSULE | Freq: Two times a day (BID) | ORAL | 0 refills | Status: DC
  Filled 2024-01-29: qty 60, 30d supply, fill #0

## 2024-01-06 NOTE — Patient Instructions (Signed)
 Continue Depakote  ER 250 mg daily Continue ziprasidone  20 mg twice a day Continue sertraline  100 mg daily  Next appointment: 12/15 at 4 pm

## 2024-01-07 ENCOUNTER — Other Ambulatory Visit: Payer: Self-pay

## 2024-01-08 ENCOUNTER — Other Ambulatory Visit: Payer: Self-pay

## 2024-01-08 ENCOUNTER — Other Ambulatory Visit (HOSPITAL_COMMUNITY): Payer: Self-pay

## 2024-01-08 ENCOUNTER — Other Ambulatory Visit (HOSPITAL_BASED_OUTPATIENT_CLINIC_OR_DEPARTMENT_OTHER): Payer: Self-pay

## 2024-01-08 MED ORDER — SERTRALINE HCL 100 MG PO TABS
100.0000 mg | ORAL_TABLET | Freq: Every day | ORAL | 0 refills | Status: DC
  Filled 2024-01-09 – 2024-01-15 (×3): qty 30, 30d supply, fill #0
  Filled 2024-01-29 – 2024-02-09 (×2): qty 30, 30d supply, fill #1
  Filled 2024-03-12: qty 30, 30d supply, fill #2

## 2024-01-09 ENCOUNTER — Other Ambulatory Visit (HOSPITAL_COMMUNITY): Payer: Self-pay

## 2024-01-09 ENCOUNTER — Other Ambulatory Visit: Payer: Self-pay

## 2024-01-12 ENCOUNTER — Other Ambulatory Visit (HOSPITAL_COMMUNITY): Payer: Self-pay

## 2024-01-12 ENCOUNTER — Other Ambulatory Visit (HOSPITAL_BASED_OUTPATIENT_CLINIC_OR_DEPARTMENT_OTHER): Payer: Self-pay

## 2024-01-12 ENCOUNTER — Other Ambulatory Visit: Payer: Self-pay

## 2024-01-14 ENCOUNTER — Other Ambulatory Visit: Payer: Self-pay

## 2024-01-15 ENCOUNTER — Other Ambulatory Visit: Payer: Self-pay

## 2024-01-15 ENCOUNTER — Encounter (INDEPENDENT_AMBULATORY_CARE_PROVIDER_SITE_OTHER)

## 2024-01-15 ENCOUNTER — Ambulatory Visit (INDEPENDENT_AMBULATORY_CARE_PROVIDER_SITE_OTHER): Admitting: Nurse Practitioner

## 2024-01-17 ENCOUNTER — Other Ambulatory Visit (HOSPITAL_COMMUNITY): Payer: Self-pay

## 2024-01-19 ENCOUNTER — Other Ambulatory Visit: Payer: Self-pay

## 2024-01-19 ENCOUNTER — Other Ambulatory Visit (HOSPITAL_COMMUNITY): Payer: Self-pay

## 2024-01-19 MED ORDER — HYDROCORTISONE 2.5 % EX OINT
TOPICAL_OINTMENT | CUTANEOUS | 0 refills | Status: AC
  Filled 2024-01-19: qty 20, 30d supply, fill #0

## 2024-01-20 ENCOUNTER — Other Ambulatory Visit: Payer: Self-pay

## 2024-01-24 ENCOUNTER — Other Ambulatory Visit: Payer: Self-pay

## 2024-01-28 ENCOUNTER — Other Ambulatory Visit: Payer: Self-pay

## 2024-01-28 ENCOUNTER — Other Ambulatory Visit (HOSPITAL_COMMUNITY): Payer: Self-pay

## 2024-01-29 ENCOUNTER — Other Ambulatory Visit: Payer: Self-pay

## 2024-01-29 ENCOUNTER — Other Ambulatory Visit (HOSPITAL_COMMUNITY): Payer: Self-pay

## 2024-02-02 ENCOUNTER — Other Ambulatory Visit: Payer: Self-pay

## 2024-02-04 ENCOUNTER — Other Ambulatory Visit: Payer: Self-pay

## 2024-02-06 ENCOUNTER — Other Ambulatory Visit: Payer: Self-pay

## 2024-02-09 ENCOUNTER — Other Ambulatory Visit: Payer: Self-pay

## 2024-02-09 NOTE — Progress Notes (Signed)
 Patient Name: Darlene Barber, female   Patient DOB: April 07, 1965 Date of Service: 02/09/2024  Patient MRN: [id number] Provider Creating Note: Saralee Stank, MD  [phone] Primary Care Physician: Rudolpho Norleen BIRCH, MD  [address] [place] [address] Additional Physicians/ Providers:   Chief Complaint   Chief Complaint  Patient presents with   Follow-up    History of Present Illness Darlene Barber is a 58 y.o. 1-White female with medical problems of arthritis, , GERD, hypertension, insomnia, migraines,  type 2 diabetes,  obstructive sleep apnea PTSD-followed by [facility].  bipolar disorder, depression schizophrenia, Hospitalization for C. difficile colitis, sepsis, acute respiratory failure requiring ventilator support hospitalization 03/10/2022 till April 23, 2022, then 05/28/2022 till 06/01/2022. 08/2022- s/p hand assisted laparoscopic colectomy subtotal for colonic stricture and GI bleeding with Dr Jordis neurologist for numbness and tingling throughout the body, headaches, memory difficulties and depression. kidney stone- detected in CT in March 2024 Family Hx: No family history of kidney disease iv contrast exposure: During hospitalization January 2024 Smoking: Used to smoke 2 pack/day.  Quit December 2023 when she was hospitalized C. difficile May 2025.  Treated with oral vancomycin  and azithromycin . ===================================== Today she presents with her significant other.  Doing fair.   No acute concerns today.  Weight has stabilized now.  Hypertension-blood pressure today is 122/81.  Chronic right leg edema.  History of right knee injury.  CKD-most recent labs are from November 2025.  Creatinine of 2.4/GFR 23.  GFR ranges between 18-23.  History of kidney stone-no recent issues  Obstructive sleep apnea-patient is not willing to use CPAP.  Scheduled to have left foot surgery for hammertoe.   The following  portions of the patient's chart were reviewed in this encounter and updated as appropriate:  Allergies  Meds  Problems  Med Hx  Surg Hx  Fam Hx        Medications   Current Outpatient Medications:    ziprasidone  (GEODON ) 20 MG capsule, Take 20 mg by mouth, Disp: , Rfl:    acyclovir  (ZOVIRAX ) 400 MG tablet, Take 400 mg by mouth in the morning and 400 mg in the evening., Disp: , Rfl:    albuterol  HFA (PROVENTIL  HFA;VENTOLIN  HFA) 108 (90 Base) MCG/ACT inhaler, Inhale 2 puffs into the lungs every 4 (four) hours as needed., Disp: , Rfl:    amLODIPine  (NORVASC ) 10 MG tablet, Take 10 mg by mouth in the morning., Disp: , Rfl:    calcitriol  (Rocaltrol ) 0.25 MCG capsule, Take 1 capsule (0.25 mcg total) by mouth 1 (one) time each day, Disp: 30 capsule, Rfl: 11   DM-Doxylamine -Acetaminophen  15-6.25-325 MG/15ML liquid, Take 30 mL by mouth 1 (one) time each day if needed, Disp: , Rfl:    fluticasone  (FLONASE ) 50 MCG/ACT nasal spray, Administer 2 sprays into affected nostril(s) in the morning., Disp: , Rfl:    Galcanezumab -gnlm (Emgality ) 120 MG/ML solution prefilled syringe, Inject 120 mg under the skin every 30 (thirty) days, Disp: , Rfl:    hydrALAZINE  25 MG tablet, Take 1 tablet (25 mg total) by mouth in the morning AND 1 tablet (25 mg total) every evening AND 1 tablet (25 mg total) daily before bedtime., Disp: 90 tablet, Rfl: 11   Lurasidone  HCl 80 MG tablet, Take 80 mg by mouth 1 (one) time each day, Disp: , Rfl:    metoprolol  tartrate (LOPRESSOR ) 50 MG tablet, Take 1 tablet (50 mg total) by mouth every morning AND 1 tablet (50 mg total)  every evening., Disp: 60 tablet, Rfl: 11   nortriptyline  (PAMELOR ) 25 MG capsule, TAKE 1 CAPSULE BY MOUTH EVERY MORNING AND TAKE 2 CAPSULES BY MOUTH EVERY NIGHT AT BEDTIME, Disp: , Rfl:    Potassium 99 MG tablet, Take 1 tablet by mouth if needed, Disp: , Rfl:    pregabalin  (LYRICA ) 50 MG capsule, Take 50 mg by mouth in the morning and 50 mg in the  evening., Disp: , Rfl:    sertraline  (ZOLOFT ) 100 MG tablet, Take 100 mg by mouth in the morning., Disp: , Rfl:    sodium bicarbonate  650 MG tablet, Take 1 tablet (650 mg total) by mouth 3 (three) times daily. (morning, evening and bedtime), Disp: 90 tablet, Rfl: 3   sucralfate  (CARAFATE ) 1 g tablet, Take 1 g by mouth in the morning and 1 g at noon and 1 g in the evening and 1 g before bedtime., Disp: , Rfl:    torsemide  (DEMADEX ) 20 MG tablet, Take 1 tablet (20 mg total) by mouth 1 (one) time each day, Disp: 30 tablet, Rfl: 11   traZODone  (DESYREL ) 100 MG tablet, Take 100 mg by mouth every night, Disp: , Rfl:    Allergies Piper, Adhesive tape, and Silicone   Physical Exam  Vitals BP 122/81 (BP Location: Right upper arm, Patient Position: Sitting)   Pulse 76   Temp 97.8 F   Wt 208 lb (94.3 kg)   SpO2 91%   BMI 43.47 kg/m   Vitals reviewed. Constitutional: She appears well-developed. No distress.  Cardiovascular:  Normal rate and regular rhythm.           Murmur heard.She exhibits edema.  Pulmonary/Chest: Effort normal and breath sounds normal. No respiratory distress.  Abdominal: Soft. There is no abdominal tenderness. No hernia.  Skin: Skin is warm and dry.  Psychiatric: She has a normal mood and affect. Her behavior is normal.  Left arm AV graft in place- bruit present  Laboratory Studies  Lab Results  Component Value Date   GLUCOSE 142 (H) 01/20/2024   CALCIUM  9.2 01/20/2024   NA 139 01/20/2024   K 4.5 01/20/2024   CO2 29.8 01/20/2024   CL 103 01/20/2024   BUN 27 (H) 01/20/2024   CREATININE 2.4 (H) 01/20/2024     Iron  Studies  Lab Units 06/19/23 1151 March 08, 2023 1213 10/31/22 1337 07/10/22 1024  IRON  ug/dL 42 46 36* 61  FERRITIN ng/mL 25 50  --   --   TIBC ug/dL 659.6 690 731 768*  IRON  SATURATION % 12 15* 13* 26    CBC  Lab Units 10/27/23 1349 08/18/23 1355 06/02/23 1342 04/23/23 1338 03-08-23 1213 12/16/22 1224 10/31/22 1337 08/26/22 1221  WBC  AUTO Thousand/uL 7.2 7.4 7.9 8.9 6.1 6.4   < > 8.2  HEMOGLOBIN g/dL 89.3* 89.7* 9.6* 89.5* 9.4* 8.8*   < > 9.4*  HEMOGLOBIN URINE   --   --   --   --   --   --   --  NEGATIVE  HEMATOCRIT % 33.2* 32.2* 29.3* 31.9* 28.8* 27.0*   < > 29.2*  MCV fL 91.2 92.8 89.6 92.2 94.4 94.7   < > 91.0  PLATELETS AUTO Thousand/uL 308 292 284 330 409* 351   < > 488*   < > = values in this interval not displayed.    Urine  Lab Units 08/26/22 1221 07/10/22 1025 07/10/22 0922  COLOR U  YELLOW  --   --   COLOR UA   --   --  Yellow  CLARITY UA   --   --  Clear  KETONES U MG/DL  NEGATIVE  --   --   KETONES UA   --   --  Negative  PH UA   --   --  6.0  UROBILINOGEN UA   --   --  0.2  PROT/CREAT RATIO UR mg/g creat 0.806*  806* 0.548*  548*  --     Imaging and Other Studies  CLINICAL DATA:  Abdominal pain   EXAM:  CT ABDOMEN AND PELVIS WITHOUT CONTRAST   TECHNIQUE:  Multidetector CT imaging of the abdomen and pelvis was performed  following the standard protocol without IV contrast.   RADIATION DOSE REDUCTION: This exam was performed according to the  departmental dose-optimization program which includes automated  exposure control, adjustment of the mA and/or kV according to  patient size and/or use of iterative reconstruction technique.   COMPARISON:  04/19/2022   FINDINGS:  Lower chest: Small linear densities in the anterior lower lung  fields may suggest scarring or subsegmental atelectasis.   Hepatobiliary: No focal abnormalities are seen in liver. Surgical  clips are seen in gallbladder fossa. There is no dilation of bile  ducts.   Pancreas: No focal abnormalities are seen.   Spleen: Unremarkable.   Adrenals/Urinary Tract: Adrenals are unremarkable. There is no  hydronephrosis. There are multiple small bilateral renal stones  measuring up to 3 mm in size. There is linear high density in the  cortex in the medial aspect of upper pole of left kidney.  Significance of this finding  is not clear. Ureters are not dilated.  Urinary bladder is not distended.   Stomach/Bowel: Stomach is not distended. Small bowel loops are not  dilated. Appendix is not seen. There is abnormal diffuse wall  thickening in colon, more so in transverse colon. There is pericolic  stranding. There is no loculated pericolic fluid collection.   Vascular/Lymphatic: Scattered arterial calcifications are seen. No  new significant lymphadenopathy is seen.   Reproductive: Uterus appears smaller than usual, possibly suggesting  partial removal. No adnexal masses are seen.   Other: There is no ascites or pneumoperitoneum. Umbilical hernia  containing fat is seen.   Musculoskeletal: No acute findings are seen.   IMPRESSION:  There is interval appearance of marked wall thickening and pericolic  stranding throughout the colon, more so in transverse colon.  Findings suggest severe inflammatory or infectious colitis. There is  no loculated pericolic abscess.   There is no evidence of intestinal obstruction or pneumoperitoneum.  There is no hydronephrosis.   Bilateral renal stones.   Other findings as described in the body of the report.    Electronically Signed    By: Gearldine Mary M.D.    On: 05/28/2022 16:20     Orders Placed This Encounter   CBC and Differential   PTH, Intact   Renal Function Panel   Uric Acid   Magnesium       07/10/2022-office urinalysis-glucose negative, bilirubin negative, ketone negative, specific gravity 1.010, blood negative, pH 6.0, protein 30 mg/dL, nitrite negative, leukocyte negative. Urine microscopic exam-bland sediment. 07/29/2022-creatinine 2.97, GFR 18 10/31/2022-creatinine 2.91, GFR 18.   12/16/2022 were discussed with patient.  Creatinine of 3.11, GFR 17.  02/18/2023-creatinine 3.36/GFR 15 04/23/2023-creatinine 2.88/GFR 18.  Improvement noted. 06/19/2023-creatinine 2.9/GFR 18.  Stable since February 2025.   Impression/Recommendations    Patient is a 58 y.o. 1-White female   1. Type 2 diabetes mellitus with diabetic chronic kidney  disease, without medication use (HCC)   2. Essential hypertension   3. Chronic kidney disease stage 4 (HCC)   4. Anemia in chronic kidney disease     Hypertensive and diabetic chronic kidney disease stage IV/5 Proteinuria Chronic kidney disease risk factors include prolonged hospitalization, AKI requiring dialysis, IV contrast exposure, kidney stones. Most recent labs-08/18/2023-creatinine 2.61/GFR 21.  Renal function at baseline. Left upper arm brachiocephalic graft placed by Dr. Jama on 02/28/2023.-Duplex on 10/03/2023 showed patency.  Angioplasty done on 11/20/2023. Hemoglobin A1c 6.8% on 01/20/2024  Not on statin, ACE-I,ARB or at this time.  Avoiding SGLT2 inhibitor due to history of UTIs and low GFR Urine protein to creatinine ratio of 0.8 g from 08/26/2022. History of subtotal colectomy in June 2024.  Not ideal candidate for PD.  Will defer transplant referral as she is actively uses marijuana. Labs today.  Hypertension with lower extremity edema She has about 1+ edema in her right leg which is chronic.  Blood pressure is acceptable. Currently managed with amlodipine , hydralazine , metoprolol  Volume managed with torsemide  20 mg daily.  Secondary hyperparathyroidism Lab Results  Component Value Date   PTH 168 (H) 10/27/2023   CALCIUM  9.2 01/20/2024   PHOS 3.5 10/27/2023  Continue calcitriol  PTH level has improved.  We will continue to monitor.  Anemia and chronic kidney disease History of laparoscopic subtotal colectomy for diverticular colonic stricture and GI bleed completed on December 03, 2022.  Patient received IV iron  on 11/26/2022.  States even IV iron  caused constipation. Hemoglobin 10.6 from 10/2023. We will continue to monitor.  Kidney stones Bilateral 3 mm kidney stones noted on CT in March 2024. Patient does not recall ever having passed a kidney stone. Will  follow over time.  Obstructive sleep apnea Not using CPAP.  She has difficulty with reflux and is afraid of gagging.  Chronic metabolic acidosis Secondary to CKD. Managed with sodium bicarbonate  supplementation.  PTSD, schizophrenic affective disorder, bipolar Followed at [facility].  Abnormal / Rapid weight gain - Will check TSH   Return in about 3 months (around 05/09/2024).   Saralee Stank, MD [facility] [address] [place] [address] Ph: [phone] Fax: [phone]

## 2024-02-10 ENCOUNTER — Other Ambulatory Visit: Payer: Self-pay

## 2024-02-20 NOTE — Progress Notes (Unsigned)
 No show

## 2024-02-23 ENCOUNTER — Ambulatory Visit: Admitting: Psychiatry

## 2024-02-23 DIAGNOSIS — Z91199 Patient's noncompliance with other medical treatment and regimen due to unspecified reason: Secondary | ICD-10-CM

## 2024-02-25 ENCOUNTER — Other Ambulatory Visit: Payer: Self-pay

## 2024-02-25 ENCOUNTER — Other Ambulatory Visit (HOSPITAL_COMMUNITY): Payer: Self-pay

## 2024-02-26 ENCOUNTER — Other Ambulatory Visit (HOSPITAL_COMMUNITY): Payer: Self-pay

## 2024-02-26 ENCOUNTER — Other Ambulatory Visit: Payer: Self-pay

## 2024-02-26 ENCOUNTER — Other Ambulatory Visit: Payer: Self-pay | Admitting: Psychiatry

## 2024-02-26 MED ORDER — DIVALPROEX SODIUM ER 250 MG PO TB24
250.0000 mg | ORAL_TABLET | Freq: Every day | ORAL | 0 refills | Status: DC
  Filled 2024-02-26: qty 30, 30d supply, fill #0

## 2024-02-26 MED ORDER — ZIPRASIDONE HCL 20 MG PO CAPS
20.0000 mg | ORAL_CAPSULE | Freq: Two times a day (BID) | ORAL | 0 refills | Status: DC
  Filled 2024-02-26: qty 60, 30d supply, fill #0

## 2024-02-26 NOTE — Telephone Encounter (Signed)
 Please contact to make in person follow up appointment

## 2024-02-27 ENCOUNTER — Other Ambulatory Visit (HOSPITAL_COMMUNITY): Payer: Self-pay

## 2024-03-02 ENCOUNTER — Other Ambulatory Visit (HOSPITAL_COMMUNITY): Payer: Self-pay

## 2024-03-02 ENCOUNTER — Other Ambulatory Visit: Payer: Self-pay

## 2024-03-02 MED ORDER — FLUTICASONE PROPIONATE 50 MCG/ACT NA SUSP
2.0000 | Freq: Every day | NASAL | 5 refills | Status: AC
  Filled 2024-03-02: qty 16, 30d supply, fill #0
  Filled 2024-04-02: qty 16, 30d supply, fill #1

## 2024-03-03 ENCOUNTER — Other Ambulatory Visit (HOSPITAL_COMMUNITY): Payer: Self-pay

## 2024-03-03 ENCOUNTER — Other Ambulatory Visit: Payer: Self-pay

## 2024-03-09 ENCOUNTER — Other Ambulatory Visit (HOSPITAL_COMMUNITY): Payer: Self-pay

## 2024-03-12 ENCOUNTER — Other Ambulatory Visit (HOSPITAL_COMMUNITY): Payer: Self-pay

## 2024-03-12 ENCOUNTER — Other Ambulatory Visit: Payer: Self-pay

## 2024-03-12 MED ORDER — TRAZODONE HCL 150 MG PO TABS
150.0000 mg | ORAL_TABLET | Freq: Every day | ORAL | 0 refills | Status: AC
  Filled 2024-03-12: qty 30, 30d supply, fill #0
  Filled 2024-04-12: qty 30, 30d supply, fill #1

## 2024-03-15 ENCOUNTER — Other Ambulatory Visit: Payer: Self-pay

## 2024-03-18 ENCOUNTER — Other Ambulatory Visit: Payer: Self-pay

## 2024-03-18 ENCOUNTER — Other Ambulatory Visit (HOSPITAL_COMMUNITY): Payer: Self-pay

## 2024-04-02 ENCOUNTER — Other Ambulatory Visit: Payer: Self-pay | Admitting: Psychiatry

## 2024-04-02 ENCOUNTER — Other Ambulatory Visit (HOSPITAL_COMMUNITY): Payer: Self-pay

## 2024-04-02 ENCOUNTER — Other Ambulatory Visit: Payer: Self-pay

## 2024-04-04 ENCOUNTER — Other Ambulatory Visit (HOSPITAL_COMMUNITY): Payer: Self-pay

## 2024-04-04 MED ORDER — ZIPRASIDONE HCL 20 MG PO CAPS
20.0000 mg | ORAL_CAPSULE | Freq: Two times a day (BID) | ORAL | 0 refills | Status: DC
  Filled 2024-04-04 – 2024-04-12 (×2): qty 60, 30d supply, fill #0

## 2024-04-04 MED ORDER — DIVALPROEX SODIUM ER 250 MG PO TB24
250.0000 mg | ORAL_TABLET | Freq: Every day | ORAL | 0 refills | Status: DC
  Filled 2024-04-04 – 2024-04-12 (×2): qty 30, 30d supply, fill #0

## 2024-04-04 NOTE — Telephone Encounter (Signed)
 Please call to make a follow up for IP visit.

## 2024-04-05 ENCOUNTER — Other Ambulatory Visit (HOSPITAL_COMMUNITY): Payer: Self-pay

## 2024-04-05 MED ORDER — SERTRALINE HCL 100 MG PO TABS
100.0000 mg | ORAL_TABLET | Freq: Every day | ORAL | 0 refills | Status: AC
  Filled 2024-04-05 – 2024-04-12 (×2): qty 30, 30d supply, fill #0

## 2024-04-05 MED ORDER — AMLODIPINE BESYLATE 10 MG PO TABS
10.0000 mg | ORAL_TABLET | Freq: Every day | ORAL | 1 refills | Status: AC
  Filled 2024-04-05 – 2024-04-12 (×2): qty 30, 30d supply, fill #0

## 2024-04-06 ENCOUNTER — Other Ambulatory Visit: Payer: Self-pay

## 2024-04-12 ENCOUNTER — Other Ambulatory Visit (HOSPITAL_COMMUNITY): Payer: Self-pay

## 2024-04-12 DIAGNOSIS — N186 End stage renal disease: Secondary | ICD-10-CM | POA: Insufficient documentation

## 2024-04-13 ENCOUNTER — Other Ambulatory Visit: Payer: Self-pay | Admitting: Psychiatry

## 2024-04-13 ENCOUNTER — Other Ambulatory Visit: Payer: Self-pay

## 2024-04-13 ENCOUNTER — Telehealth: Payer: Self-pay | Admitting: Psychiatry

## 2024-04-13 ENCOUNTER — Other Ambulatory Visit (HOSPITAL_COMMUNITY): Payer: Self-pay

## 2024-04-13 MED ORDER — ZIPRASIDONE HCL 20 MG PO CAPS: 20.0000 mg | ORAL_CAPSULE | Freq: Two times a day (BID) | ORAL | 1 refills | Status: AC

## 2024-04-13 MED ORDER — DIVALPROEX SODIUM ER 250 MG PO TB24: 250.0000 mg | ORAL_TABLET | Freq: Every day | ORAL | 1 refills | Status: AC

## 2024-04-13 NOTE — Telephone Encounter (Signed)
 Ordered

## 2024-04-14 ENCOUNTER — Other Ambulatory Visit (HOSPITAL_COMMUNITY): Payer: Self-pay

## 2024-04-15 ENCOUNTER — Encounter (INDEPENDENT_AMBULATORY_CARE_PROVIDER_SITE_OTHER)

## 2024-04-15 ENCOUNTER — Ambulatory Visit (INDEPENDENT_AMBULATORY_CARE_PROVIDER_SITE_OTHER): Admitting: Vascular Surgery

## 2024-04-15 DIAGNOSIS — J432 Centrilobular emphysema: Secondary | ICD-10-CM

## 2024-04-15 DIAGNOSIS — I10 Essential (primary) hypertension: Secondary | ICD-10-CM

## 2024-04-15 DIAGNOSIS — N185 Chronic kidney disease, stage 5: Secondary | ICD-10-CM

## 2024-04-15 DIAGNOSIS — N186 End stage renal disease: Secondary | ICD-10-CM

## 2024-04-15 DIAGNOSIS — E118 Type 2 diabetes mellitus with unspecified complications: Secondary | ICD-10-CM

## 2024-04-15 DIAGNOSIS — E1169 Type 2 diabetes mellitus with other specified complication: Secondary | ICD-10-CM

## 2024-04-22 ENCOUNTER — Ambulatory Visit (INDEPENDENT_AMBULATORY_CARE_PROVIDER_SITE_OTHER): Admitting: Vascular Surgery

## 2024-06-15 ENCOUNTER — Ambulatory Visit: Admitting: Psychiatry

## 2024-06-21 ENCOUNTER — Ambulatory Visit: Admitting: Urology
